# Patient Record
Sex: Female | Born: 1940 | Race: White | Hispanic: No | Marital: Married | Smoking: Former smoker
Health system: Southern US, Community
[De-identification: ages and names within clinical notes are randomized; demographics above are authoritative.]

## PROBLEM LIST (undated history)

## (undated) DIAGNOSIS — I252 Old myocardial infarction: Secondary | ICD-10-CM

## (undated) DIAGNOSIS — N840 Polyp of corpus uteri: Secondary | ICD-10-CM

## (undated) DIAGNOSIS — I2510 Atherosclerotic heart disease of native coronary artery without angina pectoris: Secondary | ICD-10-CM

## (undated) DIAGNOSIS — I471 Supraventricular tachycardia: Secondary | ICD-10-CM

## (undated) DIAGNOSIS — R739 Hyperglycemia, unspecified: Secondary | ICD-10-CM

## (undated) DIAGNOSIS — E785 Hyperlipidemia, unspecified: Secondary | ICD-10-CM

## (undated) DIAGNOSIS — I4719 Other supraventricular tachycardia: Secondary | ICD-10-CM

## (undated) DIAGNOSIS — G4730 Sleep apnea, unspecified: Secondary | ICD-10-CM

## (undated) DIAGNOSIS — E876 Hypokalemia: Secondary | ICD-10-CM

## (undated) DIAGNOSIS — I10 Essential (primary) hypertension: Secondary | ICD-10-CM

## (undated) DIAGNOSIS — G4734 Idiopathic sleep related nonobstructive alveolar hypoventilation: Secondary | ICD-10-CM

## (undated) HISTORY — DX: Sleep apnea, unspecified: G47.30

## (undated) HISTORY — DX: Essential (primary) hypertension: I10

## (undated) HISTORY — DX: Hypokalemia: E87.6

## (undated) HISTORY — DX: Other supraventricular tachycardia: I47.19

## (undated) HISTORY — DX: Supraventricular tachycardia: I47.1

## (undated) HISTORY — PX: TONSILLECTOMY: SUR1361

## (undated) HISTORY — DX: Polyp of corpus uteri: N84.0

## (undated) HISTORY — DX: Idiopathic sleep related nonobstructive alveolar hypoventilation: G47.34

## (undated) HISTORY — PX: APPENDECTOMY: SHX54

---

## 1976-12-21 HISTORY — PX: CHOLECYSTECTOMY: SHX55

## 1998-08-22 ENCOUNTER — Encounter: Admission: RE | Admit: 1998-08-22 | Discharge: 1998-11-20 | Payer: Self-pay | Admitting: *Deleted

## 1998-09-11 ENCOUNTER — Other Ambulatory Visit: Admission: RE | Admit: 1998-09-11 | Discharge: 1998-09-11 | Payer: Self-pay | Admitting: *Deleted

## 1999-03-17 ENCOUNTER — Ambulatory Visit: Admission: RE | Admit: 1999-03-17 | Discharge: 1999-03-17 | Payer: Self-pay | Admitting: Internal Medicine

## 1999-09-01 ENCOUNTER — Encounter: Payer: Self-pay | Admitting: Dentistry

## 1999-09-01 ENCOUNTER — Ambulatory Visit (HOSPITAL_COMMUNITY): Admission: RE | Admit: 1999-09-01 | Discharge: 1999-09-01 | Payer: Self-pay | Admitting: Dentistry

## 1999-09-22 ENCOUNTER — Other Ambulatory Visit: Admission: RE | Admit: 1999-09-22 | Discharge: 1999-09-22 | Payer: Self-pay | Admitting: *Deleted

## 2000-11-08 ENCOUNTER — Other Ambulatory Visit: Admission: RE | Admit: 2000-11-08 | Discharge: 2000-11-08 | Payer: Self-pay | Admitting: *Deleted

## 2001-11-10 ENCOUNTER — Other Ambulatory Visit: Admission: RE | Admit: 2001-11-10 | Discharge: 2001-11-10 | Payer: Self-pay | Admitting: *Deleted

## 2002-09-06 ENCOUNTER — Encounter: Admission: RE | Admit: 2002-09-06 | Discharge: 2002-09-06 | Payer: Self-pay | Admitting: Orthopedic Surgery

## 2002-09-06 ENCOUNTER — Encounter: Payer: Self-pay | Admitting: Orthopedic Surgery

## 2003-01-11 ENCOUNTER — Other Ambulatory Visit: Admission: RE | Admit: 2003-01-11 | Discharge: 2003-01-11 | Payer: Self-pay | Admitting: *Deleted

## 2004-05-05 ENCOUNTER — Encounter: Admission: RE | Admit: 2004-05-05 | Discharge: 2004-05-05 | Payer: Self-pay | Admitting: Internal Medicine

## 2004-06-18 ENCOUNTER — Other Ambulatory Visit: Admission: RE | Admit: 2004-06-18 | Discharge: 2004-06-18 | Payer: Self-pay | Admitting: *Deleted

## 2006-09-09 ENCOUNTER — Other Ambulatory Visit: Admission: RE | Admit: 2006-09-09 | Discharge: 2006-09-09 | Payer: Self-pay | Admitting: Obstetrics & Gynecology

## 2006-09-30 ENCOUNTER — Encounter: Payer: Self-pay | Admitting: Cardiovascular Disease

## 2008-12-28 ENCOUNTER — Ambulatory Visit: Payer: Self-pay | Admitting: Internal Medicine

## 2009-01-01 ENCOUNTER — Encounter: Admission: RE | Admit: 2009-01-01 | Discharge: 2009-01-29 | Payer: Self-pay | Admitting: Internal Medicine

## 2009-01-07 ENCOUNTER — Ambulatory Visit: Payer: Self-pay | Admitting: Internal Medicine

## 2009-02-18 ENCOUNTER — Other Ambulatory Visit: Admission: RE | Admit: 2009-02-18 | Discharge: 2009-02-18 | Payer: Self-pay | Admitting: Obstetrics & Gynecology

## 2009-03-26 ENCOUNTER — Ambulatory Visit: Payer: Self-pay | Admitting: Internal Medicine

## 2009-04-30 ENCOUNTER — Ambulatory Visit: Payer: Self-pay | Admitting: Internal Medicine

## 2009-05-10 ENCOUNTER — Encounter: Payer: Self-pay | Admitting: Cardiovascular Disease

## 2009-05-13 ENCOUNTER — Encounter: Payer: Self-pay | Admitting: Obstetrics & Gynecology

## 2009-05-13 ENCOUNTER — Ambulatory Visit (HOSPITAL_COMMUNITY): Admission: RE | Admit: 2009-05-13 | Discharge: 2009-05-13 | Payer: Self-pay | Admitting: Obstetrics & Gynecology

## 2009-07-01 ENCOUNTER — Encounter (INDEPENDENT_AMBULATORY_CARE_PROVIDER_SITE_OTHER): Payer: Self-pay | Admitting: *Deleted

## 2010-01-16 ENCOUNTER — Ambulatory Visit: Payer: Self-pay | Admitting: Internal Medicine

## 2010-02-19 ENCOUNTER — Telehealth: Payer: Self-pay | Admitting: Gastroenterology

## 2010-04-24 ENCOUNTER — Encounter: Admission: RE | Admit: 2010-04-24 | Discharge: 2010-04-24 | Payer: Self-pay | Admitting: Internal Medicine

## 2010-04-24 ENCOUNTER — Encounter: Payer: Self-pay | Admitting: Cardiovascular Disease

## 2010-04-24 ENCOUNTER — Ambulatory Visit: Payer: Self-pay | Admitting: Internal Medicine

## 2010-06-12 ENCOUNTER — Ambulatory Visit: Payer: Self-pay | Admitting: Cardiovascular Disease

## 2010-06-12 DIAGNOSIS — R9431 Abnormal electrocardiogram [ECG] [EKG]: Secondary | ICD-10-CM | POA: Insufficient documentation

## 2010-06-12 DIAGNOSIS — R0600 Dyspnea, unspecified: Secondary | ICD-10-CM | POA: Insufficient documentation

## 2010-06-12 DIAGNOSIS — I10 Essential (primary) hypertension: Secondary | ICD-10-CM | POA: Insufficient documentation

## 2010-06-13 ENCOUNTER — Telehealth: Payer: Self-pay | Admitting: Cardiovascular Disease

## 2010-08-04 ENCOUNTER — Ambulatory Visit: Payer: Self-pay

## 2010-08-04 ENCOUNTER — Ambulatory Visit: Payer: Self-pay | Admitting: Cardiology

## 2010-08-04 ENCOUNTER — Encounter: Payer: Self-pay | Admitting: Cardiovascular Disease

## 2010-08-04 ENCOUNTER — Ambulatory Visit (HOSPITAL_COMMUNITY): Admission: RE | Admit: 2010-08-04 | Discharge: 2010-08-04 | Payer: Self-pay | Admitting: Cardiovascular Disease

## 2010-08-08 ENCOUNTER — Ambulatory Visit: Payer: Self-pay | Admitting: Cardiovascular Disease

## 2011-01-22 NOTE — Assessment & Plan Note (Signed)
Summary: f30m/f/u stress echo   Visit Type:  Follow-up Primary Provider:  Eden Emms Baxley  CC:  none.  History of Present Illness: 70 year-old woman presenting for follow-up evaluation of shortness of breath and abnormal EKG.  She is doing well at present. She has lost almost 20 pounds over the past three months through diet and exercise and reports improvement in her breathing and exercise capacity. No chest pain, edema, orthopnea, or PND.  She underwent a stress echo earlier this week showing no evidence for ischemia.  Current Medications (verified): 1)  Klor-Con M20 20 Meq Cr-Tabs (Potassium Chloride Crys Cr) .Marland Kitchen.. 1 Tab Two Times A Day 2)  Aspirin 81 Mg Tbec (Aspirin) .... Take One Tablet By Mouth Daily 3)  Lorazepam 2 Mg Tabs (Lorazepam) .Marland Kitchen.. 1 Tab At Bedtime 4)  Folbic 2.5-25-2 Mg Tabs (Fa-Pyridoxine-Cyancobalamin) .Marland Kitchen.. 1 Tab Once Daily 5)  Tenoretic 100 100-25 Mg Tabs (Atenolol-Chlorthalidone) .Marland Kitchen.. 1 Tab Once Daily 6)  Norvasc 5 Mg Tabs (Amlodipine Besylate) .Marland Kitchen.. 1 Tab Once Daily 7)  Nexium 40 Mg Cpdr (Esomeprazole Magnesium) .Marland Kitchen.. 1 Tab Once Daily 8)  Caltrate 600+d 600-400 Mg-Unit Tabs (Calcium Carbonate-Vitamin D) .Marland Kitchen.. 1 Tab Three Times A Day 9)  Combi Patch 0.05/0.25mg  .... Change Patch Bi Weekly  Allergies (verified): 1)  ! Pcn 2)  ! Sulfa 3)  ! Tetracycline 4)  ! * Zinc Oxide  Past History:  Past medical history reviewed for relevance to current acute and chronic problems.  Past Medical History: Reviewed history from 06/12/2010 and no changes required.   Short of breath on exertion  Hypokalemia  Hypertension, essential.   Sleep apnea.   Endometrial polyp on sonohysterogram   Vital Signs:  Patient profile:   70 year old female Height:      61.5 inches Weight:      154 pounds BMI:     28.73 Pulse rate:   55 / minute Resp:     16 per minute BP sitting:   142 / 77  (right arm)  Vitals Entered By: Marrion Coy, CNA (August 08, 2010 10:59 AM)  Physical  Exam  General:  Pt is alert and oriented, in no acute distress. HEENT: normal Neck: normal carotid upstrokes without bruits, JVP normal Lungs: CTA CV: RRR without murmur or gallop Abd: soft, NT, positive BS, no bruit, no organomegaly Ext: no clubbing, cyanosis, or edema. peripheral pulses 2+ and equal Skin: warm and dry without rash    Stress Echocardiogram  Procedure date:  08/04/2010  Findings:      - Stress ECG conclusions: The stress ECG was normal. - Baseline: LV global systolic function was normal. The estimated LV   ejection fraction was 65%. Normal wall motion; no LV regional wall   motion abnormalities. - Peak stress: LV global systolic function was hyperdynamic. Normal   wall motion; no LV regional wall motion abnormalities. - Impressions: Walking of the treadmill there is good exercise   tolerance. There is no EKG change and no ectopy. With stress there   is increase in thickening and contractility in all segments. There   is decreased cavity size in all views. This is a normal stress   echo study.  Impression & Recommendations:  Problem # 1:  ABNORMAL EKG (ICD-794.31) Stress echo reviewed (report as above). Pt with no evidence of myocardial ischemia. Recommend continued risk reduction measures.  Her updated medication list for this problem includes:    Aspirin 81 Mg Tbec (Aspirin) .Marland Kitchen... Take one tablet by  mouth daily    Tenoretic 100 100-25 Mg Tabs (Atenolol-chlorthalidone) .Marland Kitchen... 1 tab once daily    Norvasc 5 Mg Tabs (Amlodipine besylate) .Marland Kitchen... 1 tab once daily  Problem # 2:  HYPERTENSION, BENIGN (ICD-401.1) She asked about changing antihypertensives so that she wouldn't have to take potassium any longer. I reviewed that we could alter meds and discontinue her diuretic. After some discussion, she decided not to make any changes today. She will discuss with Dr Lenord Fellers if she changes her mind.  Her updated medication list for this problem includes:    Aspirin 81  Mg Tbec (Aspirin) .Marland Kitchen... Take one tablet by mouth daily    Tenoretic 100 100-25 Mg Tabs (Atenolol-chlorthalidone) .Marland Kitchen... 1 tab once daily    Norvasc 5 Mg Tabs (Amlodipine besylate) .Marland Kitchen... 1 tab once daily  Patient Instructions: 1)  Your physician recommends that you schedule a follow-up appointment in: 12 months with Dr Excell Seltzer 2)  Your physician recommends that you continue on your current medications as directed. Please refer to the Current Medication list given to you today.

## 2011-01-22 NOTE — Letter (Signed)
Summary: EKG 1997 - 2007  EKG 1997 - 2007   Imported By: Roderic Ovens 06/30/2010 09:36:16  _____________________________________________________________________  External Attachment:    Type:   Image     Comment:   External Document

## 2011-01-22 NOTE — Assessment & Plan Note (Signed)
Summary: np6/sob/abnormal ekg   Visit Type:  NEW PT VISIT Primary Provider:  Eden Emms Baxley  CC:  edema/ankles at times....no other complaints today.  History of Present Illness: 70 year-old woman presenting for initial evaluation of an abnormal EKG. She has been told of an abnormal EKG in the past, dating back several years. She denies history of cardiovascular disease.   She reports onset of shortness of breath with exertion over the past 6 months, but this has improved with diet and exercise. She is walking 30-45 minutes on the treadmill daily, and is only limited by knee pain. She takes a short break in the middle of her walk because of the knee problem.  She denies chest pain or pressure with exertion. Denies palps, lightheadedness, syncope, orthopnea, or PND. She has episodic swelling of her feet.  Current Medications (verified): 1)  Klor-Con M20 20 Meq Cr-Tabs (Potassium Chloride Crys Cr) .Marland Kitchen.. 1 Tab Two Times A Day 2)  Aspirin 81 Mg Tbec (Aspirin) .... Take One Tablet By Mouth Daily 3)  Lorazepam 2 Mg Tabs (Lorazepam) .Marland Kitchen.. 1 Tab At Bedtime 4)  Folbic 2.5-25-2 Mg Tabs (Fa-Pyridoxine-Cyancobalamin) .Marland Kitchen.. 1 Tab Once Daily 5)  Tenoretic 100 100-25 Mg Tabs (Atenolol-Chlorthalidone) .Marland Kitchen.. 1 Tab Once Daily 6)  Norvasc 5 Mg Tabs (Amlodipine Besylate) .Marland Kitchen.. 1 Tab Once Daily 7)  Nexium 40 Mg Cpdr (Esomeprazole Magnesium) .Marland Kitchen.. 1 Tab Once Daily 8)  Caltrate 600+d 600-400 Mg-Unit Tabs (Calcium Carbonate-Vitamin D) .Marland Kitchen.. 1 Tab Three Times A Day 9)  Combi Patch 0.05/0.25mg  .... Change Patch Bi Weekly  Allergies (verified): 1)  ! Pcn 2)  ! Sulfa 3)  ! Tetracycline 4)  ! * Zinc Oxide  Past History:  Past medical, surgical, family and social histories (including risk factors) reviewed, and no changes noted (except as noted below).  Past Medical History:   Short of breath on exertion  Hypokalemia  Hypertension, essential.   Sleep apnea.   Endometrial polyp on sonohysterogram   Past  Surgical History: Reviewed history from 06/04/2010 and no changes required.  Hysteroscopy with polyp resection  Cholecystectomy Appendectomy Tonsillectomy  Family History: Reviewed history from 06/04/2010 and no changes required. Mother - CABG at age age 76  Social History: Reviewed history from 06/04/2010 and no changes required. 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 Also positive for GERD. Otherwise negative except as per HPI.  Vital Signs:  Patient profile:   70 year old female Height:      61.5 inches Weight:      164 pounds BMI:     30.60 Pulse rate:   63 / minute Pulse rhythm:   irregular Resp:     16 per minute BP sitting:   140 / 80  (left arm) Cuff size:   large  Vitals Entered By: Danielle Rankin, CMA (June 12, 2010 10:40 AM)  Physical Exam  General:  Pt is alert and oriented, delightful woman, in no acute distress. HEENT: normal Neck: normal carotid upstrokes without bruits, JVP normal Lungs: CTA CV: RRR without murmur or gallop Abd: soft, NT, positive BS, no bruit, no organomegaly Ext: no clubbing, cyanosis, or edema. peripheral pulses 2+  and equal Skin: warm and dry without rash    EKG  Procedure date:  06/12/2010  Findings:      NSR, T wave abnormality consider inferolater ischemia, HR 63 bpm.  Impression & Recommendations:  Problem # 1:  ABNORMAL EKG (ICD-794.31) The patient has an abnormal resting EKG, with T wave changes suggestive of inferolateral ischemia. I reviewed her EKG's dating back to 1997, and these changes have evolved over time but date back to at least 2007. In the presence of exertional shortness of breath, I have recommended proceeding with a  functional study to rule out ischemia. We discussed various imaging including a stress echo or stress Myoview. I would favor a Myoview because of higher sensitivity, but would be ok with either modality. She will discuss with her husband who is a radiation oncologist, and will call us back to let us know which stress test she is willing to do.  Her updated medication list for this problem includes:    Aspirin 81 Mg Tbec (Aspirin) .Marland Kitchen... Take one tablet by mouth daily    Tenoretic 100 100-25 Mg Tabs (Atenolol-chlorthalidone) .Marland Kitchen... 1 tab once daily    Norvasc 5 Mg Tabs (Amlodipine besylate) .Marland Kitchen... 1 tab once daily  Problem # 2:  HYPERTENSION, BENIGN (ICD-401.1) The patient has borderline BP. Home readings are generally in the 130's by her report. With ongoing efforts at diet and weight loss, I would favor continued observation for now.  Her updated medication list for this problem includes:    Aspirin 81 Mg Tbec (Aspirin) .Marland Kitchen... Take one tablet by mouth daily    Tenoretic 100 100-25 Mg Tabs (Atenolol-chlorthalidone) .Marland Kitchen... 1 tab once daily    Norvasc 5 Mg Tabs (Amlodipine besylate) .Marland Kitchen... 1 tab once daily  Other Orders: EKG w/ Interpretation (93000)  Patient Instructions: 1)  The patient will call back once she decides if she wants to have a stress echocardiogram  or exercise stress  myoview.  2)  Your physician recommends that you schedule a follow-up appointment in: 8 WEEKS 3)  Your physician recommends that you continue on your current medications as directed. Please refer to the Current Medication list given to you today.

## 2011-01-22 NOTE — Progress Notes (Signed)
Summary: Schedule Colonoscopy  Phone Note Outgoing Call Call back at Home Phone [phone]   Call placed by: Harlow Mares CMA Duncan Dull),  February 19, 2010 4:58 PM Call placed to: Patient Summary of Call: patient is going to [place] in April for her daughters wedding and she will call back to schedule her procedure after she gets back  Initial call taken by: Harlow Mares CMA Duncan Dull),  February 19, 2010 5:00 PM

## 2011-01-22 NOTE — Progress Notes (Signed)
Summary: stress echo  Phone Note Call from Patient Call back at Home Phone [phone]   Caller: Patient Reason for Call: Talk to Nurse Summary of Call: pt has decided that she wants a stress echo Initial call taken by: Migdalia Dk,  June 13, 2010 9:45 AM  Follow-up for Phone Call        I spoke with the pt and the first available stress echo slot is 07/10/10.  The pt will be in [place] from 7/17-07/22/10.  The pt's stress echo was scheduled for 08/04/10 at 10:30 and the pt will see Dr Excell Seltzer 08/08/10. Instructions were mailed to the pt's home.  Follow-up by: Julieta Gutting, RN, BSN,  June 13, 2010 12:05 PM

## 2011-03-31 LAB — CBC
HCT: 44.7 % (ref 36.0–46.0)
Platelets: 179 10*3/uL (ref 150–400)
WBC: 5.2 10*3/uL (ref 4.0–10.5)

## 2011-03-31 LAB — URINALYSIS, ROUTINE W REFLEX MICROSCOPIC
Bilirubin Urine: NEGATIVE
Nitrite: NEGATIVE
Specific Gravity, Urine: 1.01 (ref 1.005–1.030)
pH: 7.5 (ref 5.0–8.0)

## 2011-03-31 LAB — BASIC METABOLIC PANEL
BUN: 13 mg/dL (ref 6–23)
GFR calc non Af Amer: >60 mL/min (ref >60)
Potassium: 2.9 mEq/L — ABNORMAL LOW (ref 3.5–5.1)
Sodium: 139 mEq/L (ref 135–145)

## 2011-03-31 LAB — POTASSIUM: Potassium: 3.5 mEq/L (ref 3.5–5.1)

## 2011-04-29 ENCOUNTER — Telehealth: Payer: Self-pay

## 2011-04-29 DIAGNOSIS — I10 Essential (primary) hypertension: Secondary | ICD-10-CM

## 2011-04-29 MED ORDER — AMLODIPINE BESYLATE 5 MG PO TABS: 5.0000 mg | ORAL_TABLET | Freq: Every day | ORAL | Status: DC

## 2011-04-29 NOTE — Telephone Encounter (Signed)
Patient advised with this refill that she is past due for an ov. Message left with pharmacist for her to call office to schedule

## 2011-04-29 NOTE — Telephone Encounter (Signed)
Agree with this plan.

## 2011-05-05 NOTE — Op Note (Signed)
NAMEDENETRA, FORMOSO            ACCOUNT NO.:  [id number]   MEDICAL RECORD NO.:  [id number]          PATIENT TYPE:  AMB   LOCATION:  SDC                           FACILITY:  [facility]   PHYSICIAN:  M. Leda Quail, MD  DATE OF BIRTH:  1941-04-14   DATE OF PROCEDURE:  DATE OF DISCHARGE:                               OPERATIVE REPORT   PREOPERATIVE DIAGNOSES:  82. A 70 year old married white female with postmenopausal bleeding.  2. Endometrial polyp on sonohysterogram  3. Hypertension.  4. Sleep apnea.  5. Hypokalemia which was treated over the weekend with oral potassium      supplementation.   POSTOPERATIVE DIAGNOSES:  30. A 70 year old married white female with postmenopausal bleeding.  2. Endometrial polyp on sonohysterogram  3. Hypertension.  4. Sleep apnea.  5. Hypokalemia which was treated over the weekend with oral potassium      supplementation.   PROCEDURE:  Hysteroscopy with polyp resection using the resectoscope, D  and C.   SURGEON:  M. Leda Quail, MD   ASSISTANTS:  OR staff.   ANESTHESIA:  LMA, Dr. Malen Gauze oversaw the case.   FINDINGS:  Thin endometrium with endometrial polyp on the right fundal  region was noted on sonohysterogram   SPECIMENS:  Polyp and curettings to Pathology.   ESTIMATED BLOOD LOSS:  Minimal.   FLUIDS:  1000 mL of LR.   FLUID DEFICIT:  140 mL of 3% sorbitol.   URINE OUTPUT:  50 mL drained with a red rubber Foley catheter at the  beginning of the case.   COMPLICATIONS:  None.   INDICATIONS:  Ms. Fullenwider is a very nice 70 year old married white  female who has a history of postmenopausal bleeding.  She has had an  endometrial biopsy and a sonohysterogram evaluation of this.  She has  had significant issues with her back this year and she was really trying  to avoid surgery.  For a while with manipulation of HRT, her bleeding  improved and actually stopped but has recurred.  We have discussed risks  and benefits of the procedure  which are document in my office chart.  She has ultimately decided to proceed because of the continued bleeding  and I am in agreement with this plan.   PROCEDURE:  The patient was admitted through Same-Day Surgery.  She was  taken to the operating room.  She was placed in a supine position.  Anesthesia was administered by the anesthesia staff without difficulty.  Legs were positioned in the low lithotomy position on Allen stirrups.  Perineum, inner thighs, and vagina were prepped and draped in normal  sterile fashion.  A red rubber Foley catheter was used to drain the  bladder of all urine.  The legs were lifted slightly, and care was taken  not to put much strain on the low back.  A bivalved speculum was placed  in the vagina.  The cervix was visualized.  The anterior lip was grasped  with single-tooth tenaculum.  The uterus sounds to 8 cm.  Using [facility]  dilators, the cervix was dilated up to a #21.  Then, a  5-mm hysteroscope  was passed through the cervical os to the fundus.  Polyp was visualized.  Tubal ostia were visualized.  Photo documentation was made.  The  endometrium was thin as stated above.  The hysteroscope was removed.  A  polyp forceps was obtained.  With several passes of the polyp forceps,  it felt like the entire polyp was removed.  The diagnostic hysteroscope  was passed back through the endocervical canal.  There was a base of the  polyp which was not removed, but the vast majority of the polyp had  already been removed with the polyp forceps.  Several additional passes  of the polyp forceps were unsuccessful removing the remainder of the  polyp.  Therefore, the cervix was dilated up to #31 using Pratt  dilators.  Then, the resectoscope with a single loop attach was  obtained.  This was passed to the endometrial cavity.  Angling the scope  to the right with 3 passes of the loop, the remainder of the base of the  polyp was completely resected.  This tissue was removed.   Photo  documentation was obtained at this time as the cavity was nice and  smooth and absent of any polyps or other lesions.  Once the hysteroscope  was removed, a #1 rough curette was used to curette the endometrial  cavity.  Scant tissue was obtained.  This was all sent together with the  polyp pieces for pathologic analysis.  At this point, the tenaculum was  removed from the cervix.  Silver nitrate was used to obtain hemostasis  at the tenaculum sites.  The bivalved speculum was removed from the  vagina.  Sponge, lap, needle, and instrument counts were correct x2.  A  paracervical block of 1% lidocaine was instilled in the cervix for a  paracervical block.  Total 10 mL was used.  The procedure was ended at  this time.  Betadine prep was cleansed off the patient's skin.  Her legs  were positioned back in the supine position.  She did have SCDs on lower  extremities throughout the case.  She was awakened from anesthesia and  taken to the recovery room in stable condition.      Lum Keas, MD  Electronically Signed     MSM/MEDQ  D:  05/13/2009  T:  05/13/2009  Job:  [id number]

## 2011-05-14 ENCOUNTER — Other Ambulatory Visit: Payer: Self-pay | Admitting: Internal Medicine

## 2011-05-25 ENCOUNTER — Other Ambulatory Visit: Payer: Self-pay | Admitting: Internal Medicine

## 2011-05-25 DIAGNOSIS — I10 Essential (primary) hypertension: Secondary | ICD-10-CM

## 2011-05-25 MED ORDER — AMLODIPINE BESYLATE 5 MG PO TABS: 5.0000 mg | ORAL_TABLET | Freq: Every day | ORAL | Status: DC

## 2011-06-18 ENCOUNTER — Encounter: Payer: Self-pay | Admitting: Cardiovascular Disease

## 2011-07-07 ENCOUNTER — Telehealth: Payer: Self-pay | Admitting: *Deleted

## 2011-07-07 NOTE — Telephone Encounter (Signed)
Pt called going out of town tomorrow.  Requesting refill for Lorazepam 2mg  faxed to [facility] at [phone].

## 2011-07-07 NOTE — Telephone Encounter (Signed)
Written Rx for Lorazepam 2 mg #30 with 2 refills with directions one tab po hs.

## 2011-08-03 ENCOUNTER — Ambulatory Visit: Payer: Self-pay | Admitting: Cardiovascular Disease

## 2011-09-01 ENCOUNTER — Other Ambulatory Visit: Payer: Self-pay | Admitting: Internal Medicine

## 2011-09-11 ENCOUNTER — Ambulatory Visit: Payer: Self-pay | Admitting: Cardiovascular Disease

## 2011-09-22 ENCOUNTER — Other Ambulatory Visit: Payer: Self-pay | Admitting: *Deleted

## 2011-09-22 DIAGNOSIS — I10 Essential (primary) hypertension: Secondary | ICD-10-CM

## 2011-09-22 MED ORDER — AMLODIPINE BESYLATE 5 MG PO TABS: 5.0000 mg | ORAL_TABLET | Freq: Every day | ORAL | Status: DC

## 2011-09-29 ENCOUNTER — Encounter: Payer: Self-pay | Admitting: Cardiovascular Disease

## 2011-09-29 ENCOUNTER — Ambulatory Visit (INDEPENDENT_AMBULATORY_CARE_PROVIDER_SITE_OTHER): Payer: Medicare Other | Admitting: Cardiovascular Disease

## 2011-09-29 DIAGNOSIS — R9431 Abnormal electrocardiogram [ECG] [EKG]: Secondary | ICD-10-CM

## 2011-09-29 DIAGNOSIS — I10 Essential (primary) hypertension: Secondary | ICD-10-CM

## 2011-09-29 NOTE — Patient Instructions (Addendum)
Your physician wants you to follow-up in: 1 YEAR.  You will receive a reminder letter in the mail two months in advance. If you don't receive a letter, please call our office to schedule the follow-up appointment.  Your physician recommends that you continue on your current medications as directed. Please refer to the Current Medication list given to you today.  Your physician recommends that you return for a FASTING LIPID, LIVER, TSH, CBC, BMP--nothing to eat or drink after midnight, lab opens at 8:30 (Pt will call back to schedule labs 401.1, 794.31)

## 2011-10-04 ENCOUNTER — Encounter: Payer: Self-pay | Admitting: Cardiovascular Disease

## 2011-10-04 NOTE — Assessment & Plan Note (Signed)
Blood pressure is well controlled on amlodipine, atenolol, and chlorthalidone.

## 2011-10-04 NOTE — Assessment & Plan Note (Signed)
She has undergone past evaluation with a stress echocardiogram. She has no cardiac symptoms at this time. No further testing is indicated.

## 2011-10-04 NOTE — Progress Notes (Signed)
HPI:  This is a 70 year old woman presenting for followup evaluation. She has hypertension and a history of abnormal EKG. She underwent a stress echocardiogram last year that showed no significant ischemia. Overall the patient is doing well. She has stable dyspnea with exertion. She denies chest pain or chest pressure. She denies edema, orthopnea, or PND. She's had no lightheadedness, palpitations, or syncope. She reports good compliance with her medical program.  Outpatient Encounter Prescriptions as of 09/29/2011  Medication Sig Dispense Refill  . amLODipine (NORVASC) 5 MG tablet Take 1 tablet (5 mg total) by mouth daily.  30 tablet  3  . aspirin 81 MG tablet Take 81 mg by mouth daily.        Marland Kitchen atenolol-chlorthalidone (TENORETIC) 100-25 MG per tablet TAKE 1 TABLET EVERY DAY  90 tablet  0  . Calcium Carbonate-Vitamin D (CALTRATE 600+D) 600-400 MG-UNIT per tablet Take 1 tablet by mouth 3 (three) times daily with meals.        Marland Kitchen estradiol-norethindrone (COMBIPATCH) 0.05-0.25 MG/DAY Place 1 patch onto the skin 2 (two) times a week.        . folic acid-pyridoxine-cyancobalamin (FOLTX) 2.5-25-2 MG TABS Take 1 tablet by mouth daily.        Marland Kitchen LORazepam (ATIVAN) 2 MG tablet Take 2 mg by mouth at bedtime.        Marland Kitchen NEXIUM 40 MG capsule TAKE ONE CAPSULE BY MOUTH EVERY DAY  30 capsule  11  . potassium chloride SA (KLOR-CON M20) 20 MEQ tablet Take 20 mEq by mouth 2 (two) times daily.          Allergies  Allergen Reactions  . Penicillins   . Sulfonamide Derivatives   . Tetracycline   . Zinc Oxide     Past Medical History  Diagnosis Date  . SOB (shortness of breath)     on exertion  . Hypokalemia   . HTN (hypertension)     essential  . Sleep apnea   . Endometrial polyp     ROS: Negative except as per HPI  BP 130/80  Pulse 55  Resp 18  Ht 5\' 2"  (1.575 m)  Wt 155 lb (70.308 kg)  BMI 28.35 kg/m2  PHYSICAL EXAM: Pt is alert and oriented, NAD HEENT: normal Neck: JVP - normal, carotids 2+=  without bruits Lungs: CTA bilaterally CV: RRR without murmur or gallop Abd: soft, NT, Positive BS, no hepatomegaly Ext: no C/C/E, distal pulses intact and equal Skin: warm/dry no rash  EKG:  Sinus bradycardia 55 beats per minute, ST and T wave abnormality consider anterolateral and inferior ischemia, no significant change since last tracing.  ASSESSMENT AND PLAN:

## 2011-10-07 ENCOUNTER — Other Ambulatory Visit: Payer: Self-pay

## 2011-10-07 MED ORDER — LORAZEPAM 2 MG PO TABS: 2.0000 mg | ORAL_TABLET | Freq: Every day | ORAL | Status: DC

## 2011-11-28 ENCOUNTER — Other Ambulatory Visit: Payer: Self-pay | Admitting: Internal Medicine

## 2011-11-30 ENCOUNTER — Other Ambulatory Visit: Payer: Self-pay

## 2011-12-14 ENCOUNTER — Other Ambulatory Visit: Payer: Self-pay

## 2011-12-14 MED ORDER — ATENOLOL-CHLORTHALIDONE 100-25 MG PO TABS: 1.0000 | ORAL_TABLET | Freq: Every day | ORAL | Status: DC

## 2012-01-15 ENCOUNTER — Other Ambulatory Visit: Payer: Self-pay | Admitting: Internal Medicine

## 2012-01-26 ENCOUNTER — Other Ambulatory Visit: Payer: Self-pay

## 2012-01-26 MED ORDER — ATENOLOL-CHLORTHALIDONE 100-25 MG PO TABS: 1.0000 | ORAL_TABLET | Freq: Every day | ORAL | Status: DC

## 2012-02-29 ENCOUNTER — Other Ambulatory Visit: Payer: Self-pay

## 2012-02-29 MED ORDER — ATENOLOL-CHLORTHALIDONE 100-25 MG PO TABS: 1.0000 | ORAL_TABLET | Freq: Every day | ORAL | Status: DC

## 2012-03-01 ENCOUNTER — Other Ambulatory Visit: Payer: Self-pay | Admitting: Internal Medicine

## 2012-03-08 ENCOUNTER — Telehealth: Payer: Self-pay | Admitting: Cardiovascular Disease

## 2012-03-08 DIAGNOSIS — R739 Hyperglycemia, unspecified: Secondary | ICD-10-CM

## 2012-03-08 NOTE — Telephone Encounter (Signed)
Pt is requesting to have a hgba1c done tomorrow am when she comes in for blood work.  She states she had one done a year ago and it was elevated.  She states has been working on diet and exercise.

## 2012-03-08 NOTE — Telephone Encounter (Signed)
New Msg: Pt calling wanting to know if MD can put order in for pt get Hem A1C drawn when pt comes in to get lab work tomorrow 03/09/12. Please return pt call to discuss further if necessary or put order in.

## 2012-03-08 NOTE — Telephone Encounter (Signed)
Pt was notified that hgba1c was ordered.

## 2012-03-08 NOTE — Telephone Encounter (Signed)
That's fine

## 2012-03-09 ENCOUNTER — Other Ambulatory Visit (INDEPENDENT_AMBULATORY_CARE_PROVIDER_SITE_OTHER): Payer: Medicare Other

## 2012-03-09 DIAGNOSIS — R9431 Abnormal electrocardiogram [ECG] [EKG]: Secondary | ICD-10-CM

## 2012-03-09 DIAGNOSIS — I10 Essential (primary) hypertension: Secondary | ICD-10-CM | POA: Diagnosis not present

## 2012-03-09 DIAGNOSIS — R739 Hyperglycemia, unspecified: Secondary | ICD-10-CM

## 2012-03-09 DIAGNOSIS — R7309 Other abnormal glucose: Secondary | ICD-10-CM

## 2012-03-09 LAB — LIPID PANEL
Cholesterol: 240 mg/dL — ABNORMAL HIGH (ref 0–200)
Total CHOL/HDL Ratio: 4
Triglycerides: 212 mg/dL — ABNORMAL HIGH (ref 0.0–149.0)

## 2012-03-09 LAB — CBC WITH DIFFERENTIAL/PLATELET
Basophils Absolute: 0 10*3/uL (ref 0.0–0.1)
Basophils Relative: 0.5 % (ref 0.0–3.0)
Hemoglobin: 15 g/dL (ref 12.0–15.0)
Lymphocytes Relative: 34.1 % (ref 12.0–46.0)
Monocytes Relative: 7.5 % (ref 3.0–12.0)
Neutro Abs: 2.4 10*3/uL (ref 1.4–7.7)
Neutrophils Relative %: 56.3 % (ref 43.0–77.0)
RBC: 4.58 Mil/uL (ref 3.87–5.11)

## 2012-03-09 LAB — BASIC METABOLIC PANEL
Calcium: 9.4 mg/dL (ref 8.4–10.5)
Creatinine, Ser: 0.9 mg/dL (ref 0.4–1.2)
GFR: 69.03 mL/min (ref >60.00)
Sodium: 139 mEq/L (ref 135–145)

## 2012-03-09 LAB — HEPATIC FUNCTION PANEL
ALT: 17 U/L (ref 0–35)
AST: 20 U/L (ref 0–37)
Albumin: 4 g/dL (ref 3.5–5.2)
Total Protein: 6.9 g/dL (ref 6.0–8.3)

## 2012-03-09 LAB — LDL CHOLESTEROL, DIRECT: Direct LDL: 152.4 mg/dL

## 2012-03-09 LAB — HEMOGLOBIN A1C: Hgb A1c MFr Bld: 5.7 % (ref 4.6–6.5)

## 2012-03-16 ENCOUNTER — Telehealth: Payer: Self-pay | Admitting: Cardiovascular Disease

## 2012-03-16 NOTE — Telephone Encounter (Signed)
Pt came in Signed ROI, Picked up Copy of Labs 03/16/12/KM

## 2012-03-17 ENCOUNTER — Encounter: Payer: Medicare Other | Admitting: Internal Medicine

## 2012-03-26 ENCOUNTER — Other Ambulatory Visit: Payer: Self-pay

## 2012-03-26 ENCOUNTER — Inpatient Hospital Stay (HOSPITAL_COMMUNITY)
Admission: EM | Admit: 2012-03-26 | Discharge: 2012-03-30 | DRG: 247 | Disposition: A | Discharge status: Home / Self Care | Payer: Medicare Other | Attending: Cardiovascular Disease | Admitting: Cardiovascular Disease

## 2012-03-26 ENCOUNTER — Encounter (HOSPITAL_COMMUNITY): Payer: Self-pay | Admitting: Physical Medicine and Rehabilitation

## 2012-03-26 DIAGNOSIS — I959 Hypotension, unspecified: Secondary | ICD-10-CM | POA: Diagnosis not present

## 2012-03-26 DIAGNOSIS — R7309 Other abnormal glucose: Secondary | ICD-10-CM | POA: Diagnosis present

## 2012-03-26 DIAGNOSIS — E876 Hypokalemia: Secondary | ICD-10-CM | POA: Diagnosis present

## 2012-03-26 DIAGNOSIS — E785 Hyperlipidemia, unspecified: Secondary | ICD-10-CM | POA: Diagnosis present

## 2012-03-26 DIAGNOSIS — I214 Non-ST elevation (NSTEMI) myocardial infarction: Principal | ICD-10-CM | POA: Diagnosis present

## 2012-03-26 DIAGNOSIS — I10 Essential (primary) hypertension: Secondary | ICD-10-CM | POA: Diagnosis present

## 2012-03-26 DIAGNOSIS — L740 Miliaria rubra: Secondary | ICD-10-CM | POA: Diagnosis not present

## 2012-03-26 DIAGNOSIS — G4733 Obstructive sleep apnea (adult) (pediatric): Secondary | ICD-10-CM | POA: Diagnosis present

## 2012-03-26 DIAGNOSIS — I2510 Atherosclerotic heart disease of native coronary artery without angina pectoris: Secondary | ICD-10-CM | POA: Diagnosis present

## 2012-03-26 DIAGNOSIS — Z23 Encounter for immunization: Secondary | ICD-10-CM

## 2012-03-26 DIAGNOSIS — I517 Cardiomegaly: Secondary | ICD-10-CM | POA: Diagnosis not present

## 2012-03-26 DIAGNOSIS — R079 Chest pain, unspecified: Secondary | ICD-10-CM | POA: Diagnosis not present

## 2012-03-26 DIAGNOSIS — I498 Other specified cardiac arrhythmias: Secondary | ICD-10-CM | POA: Diagnosis not present

## 2012-03-26 HISTORY — DX: Atherosclerotic heart disease of native coronary artery without angina pectoris: I25.10

## 2012-03-26 HISTORY — DX: Hyperlipidemia, unspecified: E78.5

## 2012-03-26 HISTORY — DX: Hyperglycemia, unspecified: R73.9

## 2012-03-26 LAB — DIFFERENTIAL
Basophils Absolute: 0 10*3/uL (ref 0.0–0.1)
Eosinophils Relative: 2 % (ref 0–5)
Lymphocytes Relative: 36 % (ref 12–46)
Monocytes Absolute: 0.6 10*3/uL (ref 0.1–1.0)
Monocytes Relative: 9 % (ref 3–12)

## 2012-03-26 LAB — APTT: aPTT: 32 seconds (ref 24–37)

## 2012-03-26 LAB — COMPREHENSIVE METABOLIC PANEL
AST: 22 U/L (ref 0–37)
BUN: 13 mg/dL (ref 6–23)
CO2: 29 mEq/L (ref 19–32)
Calcium: 9.6 mg/dL (ref 8.4–10.5)
Chloride: 98 mEq/L (ref 96–112)
Creatinine, Ser: 0.82 mg/dL (ref 0.50–1.10)
GFR calc Af Amer: 81 mL/min — ABNORMAL LOW (ref >90)
GFR calc non Af Amer: 70 mL/min — ABNORMAL LOW (ref >90)
Total Bilirubin: 0.3 mg/dL (ref 0.3–1.2)

## 2012-03-26 LAB — CBC
HCT: 43.7 % (ref 36.0–46.0)
Hemoglobin: 15.4 g/dL — ABNORMAL HIGH (ref 12.0–15.0)
MCHC: 35.2 g/dL (ref 30.0–36.0)
MCV: 92.2 fL (ref 78.0–100.0)
RDW: 12.4 % (ref 11.5–15.5)

## 2012-03-26 LAB — POCT I-STAT TROPONIN I: Troponin i, poc: 0.09 ng/mL (ref 0.00–0.08)

## 2012-03-26 LAB — CARDIAC PANEL(CRET KIN+CKTOT+MB+TROPI)
Relative Index: INVALID (ref 0.0–2.5)
Total CK: 87 U/L (ref 7–177)

## 2012-03-26 MED ORDER — NITROGLYCERIN IN D5W 200-5 MCG/ML-% IV SOLN
3.0000 ug/min | INTRAVENOUS | Status: DC
  Administered 2012-03-26: 3 ug/min via INTRAVENOUS
  Filled 2012-03-26: qty 250

## 2012-03-26 MED ORDER — ATENOLOL-CHLORTHALIDONE 100-25 MG PO TABS: 1.0000 | ORAL_TABLET | Freq: Every day | ORAL | Status: DC

## 2012-03-26 MED ORDER — ONDANSETRON HCL 4 MG/2ML IJ SOLN
4.0000 mg | Freq: Four times a day (QID) | INTRAMUSCULAR | Status: DC | PRN
  Administered 2012-03-27: 4 mg via INTRAVENOUS
  Filled 2012-03-26: qty 2

## 2012-03-26 MED ORDER — ATENOLOL 100 MG PO TABS
100.0000 mg | ORAL_TABLET | Freq: Every day | ORAL | Status: DC
  Filled 2012-03-26: qty 1

## 2012-03-26 MED ORDER — NITROGLYCERIN 0.4 MG SL SUBL
0.4000 mg | SUBLINGUAL_TABLET | SUBLINGUAL | Status: DC | PRN
  Administered 2012-03-26: 0.4 mg via SUBLINGUAL
  Filled 2012-03-26: qty 25

## 2012-03-26 MED ORDER — LORAZEPAM 1 MG PO TABS: 2.0000 mg | ORAL_TABLET | Freq: Every day | ORAL | Status: DC

## 2012-03-26 MED ORDER — NITROGLYCERIN 0.4 MG SL SUBL: 0.4000 mg | SUBLINGUAL_TABLET | SUBLINGUAL | Status: DC | PRN

## 2012-03-26 MED ORDER — ATORVASTATIN CALCIUM 10 MG PO TABS
10.0000 mg | ORAL_TABLET | Freq: Every day | ORAL | Status: DC
  Filled 2012-03-26 (×2): qty 1

## 2012-03-26 MED ORDER — HEPARIN (PORCINE) IN NACL 100-0.45 UNIT/ML-% IJ SOLN
800.0000 [IU]/h | INTRAMUSCULAR | Status: DC
  Administered 2012-03-26: 800 [IU]/h via INTRAVENOUS
  Filled 2012-03-26: qty 250

## 2012-03-26 MED ORDER — POTASSIUM CHLORIDE CRYS ER 20 MEQ PO TBCR
20.0000 meq | EXTENDED_RELEASE_TABLET | Freq: Two times a day (BID) | ORAL | Status: DC
  Administered 2012-03-27 – 2012-03-30 (×7): 20 meq via ORAL
  Filled 2012-03-26 (×8): qty 1

## 2012-03-26 MED ORDER — HEPARIN BOLUS VIA INFUSION
3500.0000 [IU] | Freq: Once | INTRAVENOUS | Status: AC
  Administered 2012-03-26: 3500 [IU] via INTRAVENOUS

## 2012-03-26 MED ORDER — AMLODIPINE BESYLATE 5 MG PO TABS
5.0000 mg | ORAL_TABLET | Freq: Every day | ORAL | Status: DC
  Administered 2012-03-27: 5 mg via ORAL
  Filled 2012-03-26: qty 1

## 2012-03-26 MED ORDER — CHLORTHALIDONE 25 MG PO TABS
25.0000 mg | ORAL_TABLET | Freq: Every day | ORAL | Status: DC
  Filled 2012-03-26: qty 1

## 2012-03-26 MED ORDER — CALCIUM CARBONATE-VITAMIN D 600-400 MG-UNIT PO TABS: 1.0000 | ORAL_TABLET | Freq: Two times a day (BID) | ORAL | Status: DC

## 2012-03-26 MED ORDER — ACETAMINOPHEN 325 MG PO TABS: 650.0000 mg | ORAL_TABLET | ORAL | Status: DC | PRN

## 2012-03-26 MED ORDER — PANTOPRAZOLE SODIUM 40 MG PO TBEC
80.0000 mg | DELAYED_RELEASE_TABLET | Freq: Every day | ORAL | Status: DC
  Administered 2012-03-27 – 2012-03-29 (×3): 80 mg via ORAL
  Filled 2012-03-26 (×3): qty 1
  Filled 2012-03-26: qty 2

## 2012-03-26 MED ORDER — ASPIRIN EC 81 MG PO TBEC
81.0000 mg | DELAYED_RELEASE_TABLET | Freq: Every day | ORAL | Status: DC
  Administered 2012-03-27 – 2012-03-30 (×3): 81 mg via ORAL
  Filled 2012-03-26 (×4): qty 1

## 2012-03-26 MED ORDER — CALCIUM CARBONATE-VITAMIN D 500-200 MG-UNIT PO TABS
1.0000 | ORAL_TABLET | Freq: Two times a day (BID) | ORAL | Status: DC
  Administered 2012-03-27 – 2012-03-30 (×6): 1 via ORAL
  Filled 2012-03-26 (×9): qty 1

## 2012-03-26 MED ORDER — ASPIRIN 81 MG PO CHEW
324.0000 mg | CHEWABLE_TABLET | ORAL | Status: AC
  Administered 2012-03-27: 324 mg via ORAL
  Filled 2012-03-26: qty 4

## 2012-03-26 MED ORDER — POTASSIUM CHLORIDE CRYS ER 20 MEQ PO TBCR
40.0000 meq | EXTENDED_RELEASE_TABLET | Freq: Once | ORAL | Status: AC
  Administered 2012-03-26: 40 meq via ORAL
  Filled 2012-03-26: qty 2

## 2012-03-26 MED ORDER — FA-PYRIDOXINE-CYANOCOBALAMIN 2.5-25-2 MG PO TABS
1.0000 | ORAL_TABLET | Freq: Every day | ORAL | Status: DC
  Administered 2012-03-27 – 2012-03-30 (×4): 1 via ORAL
  Filled 2012-03-26 (×4): qty 1

## 2012-03-26 MED ORDER — MORPHINE SULFATE 2 MG/ML IJ SOLN
INTRAMUSCULAR | Status: AC
  Administered 2012-03-26: 2 mg via INTRAVENOUS
  Filled 2012-03-26: qty 1

## 2012-03-26 MED ORDER — CALCIUM CARBONATE-VITAMIN D 500-200 MG-UNIT PO TABS
1.0000 | ORAL_TABLET | Freq: Two times a day (BID) | ORAL | Status: DC
  Filled 2012-03-26: qty 1

## 2012-03-26 MED ORDER — MORPHINE SULFATE 2 MG/ML IJ SOLN
2.0000 mg | INTRAMUSCULAR | Status: DC | PRN
  Administered 2012-03-26: 2 mg via INTRAVENOUS

## 2012-03-26 MED ORDER — ASPIRIN 81 MG PO TABS: 162.0000 mg | ORAL_TABLET | Freq: Every day | ORAL | Status: DC

## 2012-03-26 MED ORDER — LORAZEPAM 0.5 MG PO TABS
2.0000 mg | ORAL_TABLET | Freq: Every day | ORAL | Status: DC
  Administered 2012-03-27 – 2012-03-29 (×3): 2 mg via ORAL
  Filled 2012-03-26 (×2): qty 2
  Filled 2012-03-26: qty 3
  Filled 2012-03-26: qty 1

## 2012-03-26 MED ORDER — ACETAMINOPHEN 500 MG PO TABS
1000.0000 mg | ORAL_TABLET | Freq: Four times a day (QID) | ORAL | Status: DC | PRN
  Administered 2012-03-27 – 2012-03-29 (×4): 1000 mg via ORAL
  Filled 2012-03-26 (×4): qty 2

## 2012-03-26 NOTE — ED Notes (Signed)
Patient being transported upstairs on portable cardiac monitor with RN. 

## 2012-03-26 NOTE — ED Notes (Signed)
Pt presents to department for evaluation of midsternal chest pain radiating to throat. Onset today. Pt states she has acid reflux but this pain feels different. Denies SOB. States that she feels hot and flushed. 2/10 pain upon arrival. Pt conscious alert and oriented x4. Respirations unlabored. Took 325mg  ASA prior to arrival.

## 2012-03-26 NOTE — ED Notes (Signed)
Patient here with episode at 3 pm started feeling tired and sts thought it was reflux but did not have change in pain, had similar episode on Wednesday as well. Today and now it is focused more on left chest but when it first started it was mainly midline but radiated upward and into jaw like a toothach. Receeding in nature now rates at 3/10

## 2012-03-26 NOTE — ED Notes (Addendum)
Patient currently sitting up in bed; no respiratory or acute distress noted.  Cardiologist at bedside.  Will continue to monitor. 

## 2012-03-26 NOTE — ED Notes (Signed)
Calling report now. 

## 2012-03-26 NOTE — Progress Notes (Signed)
ANTICOAGULATION CONSULT NOTE - Initial Consult  Pharmacy Consult:  Heparin Indication: ACS  Allergies  Allergen Reactions  . Penicillins Hives and Swelling  . Sulfonamide Derivatives Hives and Swelling  . Tetracycline Hives and Swelling  . Zinc Oxide Rash    Patient Measurements: Weight = 70kg Heparin Dosing Weight: 65kg  Vital Signs: Temp: 98.4 F (36.9 C) (04/06 1643) Temp src: Oral (04/06 1643) BP: 154/67 mmHg (04/06 1643) Pulse Rate: 68  (04/06 1643)  Labs:  Basename 03/26/12 1655  HGB 15.4*  HCT 43.7  PLT 162  APTT --  LABPROT --  INR --  HEPARINUNFRC --  CREATININE 0.82  CKTOTAL --  CKMB --  TROPONINI --   The CrCl is unknown because both a height and weight (above a minimum accepted value) are required for this calculation.  Medical History: Past Medical History  Diagnosis Date  . SOB (shortness of breath)     on exertion  . Hypokalemia   . HTN (hypertension)     essential  . Sleep apnea   . Endometrial polyp      Assessment: 25 YOF with mid-sternal chest pain/positive cardiac enzymes to start heparin gtt for anticoagulation.  CBC WNL, baseline INR 1.   Goal of Therapy:  HL 0.3 - 0.7 units/mL    Plan:  - Heparin 3500 units IV bolus, then - Heparin gtt at 800 units/hr  - Check 8 hr HL - Daily HL / CBC    Alyzza Andringa D. Laney Potash, PharmD, BCPS Pager:  [phone] 03/26/2012, 7:00 PM

## 2012-03-26 NOTE — H&P (Signed)
Admit date: 03/26/2012 Referring Physician  Dr. Preston Fleeting  Primary Cardiologist Dr. Excell Seltzer Chief complaint/reason for admission:Chest pain  HPI: This is a 70yo WF with a histofy of HTN and SOB with normal stress myoview done in 2011 for inverted T wave in lateral precordial waves that was normal.  She last saw Dr.Cooper in the fall and was doing well.  About 3pm today she developed chest pain midsternal with radiation to the left side and up into her neck and left arm.  She describes it as a heaviness which lasted about 1-2 hours and since being in the ER has been intermittent.  She denied any SOB, diaphoresis, palpitations or dizziness.  She was mildly flushed and nauseated. She also felt fatigue more than usual.  Her SP currently is 3/10.      PMH:    Past Medical History  Diagnosis Date  . SOB (shortness of breath)     on exertion  . Hypokalemia   . HTN (hypertension)     essential  . Sleep apnea   . Endometrial polyp     PSH:    Past Surgical History  Procedure Date  . Cholecystectomy   . Appendectomy   . Tonsillectomy   . Cesarean section     ALLERGIES:   Penicillins; Sulfonamide derivatives; Tetracycline; and Zinc oxide  Prior to Admit Meds:   (Not in a hospital admission) Family HX:    Family History  Problem Relation Age of Onset  . Coronary artery disease Mother     CABG at age 28   Social HX:    History   Social History  . Marital Status: Married    Spouse Name: N/A    Number of Children: N/A  . Years of Education: N/A   Occupational History  . Retired Engineer, civil (consulting)    Social History Main Topics  . Smoking status: Former Games developer  . Smokeless tobacco: Not on file  . Alcohol Use: Yes     socially  . Drug Use: No  . Sexually Active: Not on file   Other Topics Concern  . Not on file   Social History Narrative  . No narrative on file     ROS:  All 11 ROS were addressed and are negative except what is stated in the HPI  PHYSICAL EXAM Filed Vitals:   03/26/12  1945  BP: 147/64  Pulse: 64  Temp:   Resp: 12   General: Well developed, well nourished, in no acute distress Head: Eyes PERRLA, No xanthomas.   Normal cephalic and atramatic  Lungs:   Clear bilaterally to auscultation and percussion. Heart:   HRRR S1 S2 Pulses are 2+ & equal. Abdomen: Bowel sounds are positive, abdomen soft and non-tender without masses Extremities:   No clubbing, cyanosis or edema.  DP +1 Neuro: Alert and oriented X 3. Psych:  Good affect, responds appropriately   Labs:   Lab Results  Component Value Date   WBC 6.0 03/26/2012   HGB 15.4* 03/26/2012   HCT 43.7 03/26/2012   MCV 92.2 03/26/2012   PLT 162 03/26/2012    Lab 03/26/12 1655  NA 137  K 3.4*  CL 98  CO2 29  BUN 13  CREATININE 0.82  CALCIUM 9.6  PROT 7.0  BILITOT 0.3  ALKPHOS 60  ALT 19  AST 22  GLUCOSE 103*     Lab Results  Component Value Date   INR 1.00 03/26/2012     Lab Results  Component Value Date  CHOL 240* 03/09/2012   Lab Results  Component Value Date   HDL 57.90 03/09/2012   No results found for this basename: [facility]   Lab Results  Component Value Date   TRIG 212.0* 03/09/2012   Lab Results  Component Value Date   CHOLHDL 4 03/09/2012   Lab Results  Component Value Date   LDLDIRECT 152.4 03/09/2012      Radiology:  None  EKG:  NSR with normalization of old T wave inversions in the lateral precordial leads  ASSESSMENT:  1.  Chest pain with mildly elevated troponin and normalization of old T wave inversions in the lateral precordial leads 2.  HTN 3.  OSA not on CPAP 4.  hypokalemia  PLAN:   1.  Admit to tele bed, cycle cardiac enzymes 2.  IV Heparin gtt 3.  IV NTG gtt 4.  ASA 5.  Continue Tenoretic/amlodipine 6.  Replete potassium 7.  Further workup per [facility] Cardiology  Quintella Reichert, MD  03/26/2012  8:59 PM

## 2012-03-26 NOTE — ED Provider Notes (Signed)
History     CSN: [id number]  Arrival date & time 03/26/12  1628   First MD Initiated Contact with Patient 03/26/12 1838      Chief Complaint  Patient presents with  . Chest Pain    (Consider location/radiation/quality/duration/timing/severity/associated sxs/prior treatment) Patient is a 71 y.o. female presenting with chest pain. The history is provided by the patient.  Chest Pain   She had onset at about 3:30 PM of feeling generally weak. She sat down and then developed a pain across her chest with some radiation to her neck and jaw. She is unable to characterize the pain other than that it was not pressure and was not tightness and it was not sharp. Pain was moderately severe at 8/10. It is now subsided and is about 2/10. There was mild nausea, but no dyspnea or diaphoresis. She relates that she had similar episode about 3 days ago which woke her up during the night and only lasted about 20-30 minutes. She's also had several other episodes over the last several weeks which have always been nocturnal. While this pain has been present today, nothing makes it better nothing makes it worse. She tried going to different positions including sitting, standing, walking and nothing seem to affect it. She has seen a cardiologist for an abnormal ECG and had a stress test about 18 months ago which was reported to be normal.  Past Medical History  Diagnosis Date  . SOB (shortness of breath)     on exertion  . Hypokalemia   . HTN (hypertension)     essential  . Sleep apnea   . Endometrial polyp     Past Surgical History  Procedure Date  . Hysterectomy with polpy resection   . Cholecystectomy   . Appendectomy   . Tonsillectomy     Family History  Problem Relation Age of Onset  . Coronary artery disease Mother     CABG at age 91    History  Substance Use Topics  . Smoking status: Never Smoker   . Smokeless tobacco: Not on file  . Alcohol Use: Yes     socially    OB History    Grav Para Term Preterm Abortions TAB SAB Ect Mult Living                  Review of Systems  Cardiovascular: Positive for chest pain.  All other systems reviewed and are negative.    Allergies  Penicillins; Sulfonamide derivatives; Tetracycline; and Zinc oxide  Home Medications   Current Outpatient Rx  Name Route Sig Dispense Refill  . ACETAMINOPHEN 500 MG PO TABS Oral Take 1,000 mg by mouth every 6 (six) hours as needed. For pain    . AMLODIPINE BESYLATE 5 MG PO TABS  TAKE 1 TABLET (5 MG TOTAL) BY MOUTH DAILY. 30 tablet 3  . ASPIRIN 325 MG PO TABS Oral Take 325 mg by mouth every 6 (six) hours as needed. For pain    . ASPIRIN 81 MG PO TABS Oral Take 162 mg by mouth daily.     . ATENOLOL-CHLORTHALIDONE 100-25 MG PO TABS Oral Take 1 tablet by mouth daily. 30 tablet 3  . CALCIUM CARBONATE-VITAMIN D 600-400 MG-UNIT PO TABS Oral Take 1 tablet by mouth 2 (two) times daily.     Marland Kitchen ESTRADIOL-NORETHINDRONE ACET 0.05-0.25 MG/DAY TD PTTW Transdermal Place 1 patch onto the skin 2 (two) times a week.      . FOLBIC 2.5-25-2 MG PO TABS  TAKE 1 TABLET EVERY DAY 30 tablet 12  . KLOR-CON M20 20 MEQ PO TBCR  TAKE 1 TABLET BY MOUTH TWICE DAILY 60 tablet 11  . LORAZEPAM 2 MG PO TABS Oral Take 1 tablet (2 mg total) by mouth at bedtime. 30 tablet 5  . NEXIUM 40 MG PO CPDR  TAKE ONE CAPSULE BY MOUTH EVERY DAY 30 capsule 11    BP 154/67  Pulse 68  Temp(Src) 98.4 F (36.9 C) (Oral)  Resp 16  SpO2 97%  Physical Exam  Nursing note and vitals reviewed. 71 year old female who is resting comfortably and in no acute distress. Vital signs are significant for mild hypertension with blood pressure 154/67. Oxygen saturation is 97% which is normal. Head is normal cephalic and atraumatic. PERRLA, EOMI. Her Chal[place] Guest is clear. Neck is supple without adenopathy. There is no JVD. Lungs are clear without rales, wheezes, rhonchi. Heart has regular rate rhythm without murmur. Abdomen is soft, flat, nontender without masses  or hepatosplenomegaly. Extremities have no cyanosis or edema. Skin is warm and dry without rash. Neurologic: Mental status is normal, cranial nerves are intact, there are no focal motor or sensory deficits.  ED Course  Procedures (including critical care time)  Results for orders placed during the hospital encounter of 03/26/12  CBC      Component Value Range   WBC 6.0  4.0 - 10.5 (K/uL)   RBC 4.74  3.87 - 5.11 (MIL/uL)   Hemoglobin 15.4 (*) 12.0 - 15.0 (g/dL)   HCT 40.9  81.1 - 91.4 (%)   MCV 92.2  78.0 - 100.0 (fL)   MCH 32.5  26.0 - 34.0 (pg)   MCHC 35.2  30.0 - 36.0 (g/dL)   RDW 78.2  95.6 - 21.3 (%)   Platelets 162  150 - 400 (K/uL)  DIFFERENTIAL      Component Value Range   Neutrophils Relative 53  43 - 77 (%)   Neutro Abs 3.2  1.7 - 7.7 (K/uL)   Lymphocytes Relative 36  12 - 46 (%)   Lymphs Abs 2.2  0.7 - 4.0 (K/uL)   Monocytes Relative 9  3 - 12 (%)   Monocytes Absolute 0.6  0.1 - 1.0 (K/uL)   Eosinophils Relative 2  0 - 5 (%)   Eosinophils Absolute 0.1  0.0 - 0.7 (K/uL)   Basophils Relative 0  0 - 1 (%)   Basophils Absolute 0.0  0.0 - 0.1 (K/uL)  COMPREHENSIVE METABOLIC PANEL      Component Value Range   Sodium 137  135 - 145 (mEq/L)   Potassium 3.4 (*) 3.5 - 5.1 (mEq/L)   Chloride 98  96 - 112 (mEq/L)   CO2 29  19 - 32 (mEq/L)   Glucose, Bld 103 (*) 70 - 99 (mg/dL)   BUN 13  6 - 23 (mg/dL)   Creatinine, Ser 0.86  0.50 - 1.10 (mg/dL)   Calcium 9.6  8.4 - 57.8 (mg/dL)   Total Protein 7.0  6.0 - 8.3 (g/dL)   Albumin 4.0  3.5 - 5.2 (g/dL)   AST 22  0 - 37 (U/L)   ALT 19  0 - 35 (U/L)   Alkaline Phosphatase 60  39 - 117 (U/L)   Total Bilirubin 0.3  0.3 - 1.2 (mg/dL)   GFR calc non Af Amer 70 (*) >90 (mL/min)   GFR calc Af Amer 81 (*) >90 (mL/min)  POCT I-STAT TROPONIN I      Component Value Range  Troponin i, poc 0.09 (*) 0.00 - 0.08 (ng/mL)   Comment 3           CARDIAC PANEL(CRET KIN+CKTOT+MB+TROPI)      Component Value Range   Total CK 87  7 - 177 (U/L)     CK, MB 3.6  0.3 - 4.0 (ng/mL)   Troponin I <0.30  <0.30 (ng/mL)   Relative Index RELATIVE INDEX IS INVALID  0.0 - 2.5   PROTIME-INR      Component Value Range   Prothrombin Time 13.4  11.6 - 15.2 (seconds)   INR 1.00  0.00 - 1.49   APTT      Component Value Range   aPTT 32  24 - 37 (seconds)      Date: 03/26/2012  Rate: 65  Rhythm: normal sinus rhythm  QRS Axis: normal  Intervals: normal  ST/T Wave abnormalities: nonspecific ST changes  Conduction Disutrbances:none  Narrative Interpretation: Nonspecific ST changes with very minimal ST depression in the inferior and anterolateral leads. When compared with ECG of 05/10/2009, nonspecific ST changes are now present, previously noted anterolateral T wave inversions have resolved.  Old EKG Reviewed: changes noted  Because of elevated cardiac markers ECG changes, and she was started on Heparin. Repeat cardiac markers have been ordered and case was discussed with Dr. Mayford Knife of cardiology who agrees to admit the patient.  1. Chest pain       MDM  Chest pain which is concerning for acute coronary syndrome. Normalization of the T waves on a CT could indicate ischemia. She'll be started on heparin and been given nitroglycerin and consideration for nitroglycerin ointment and/or drip. She will begin morphine if needed. Of note, elevated cardiac markers apparently were not brought to the attention of any physician. Repeat point-of-care troponins have been ordered again without notifying the physician. Her prior records were reviewed, and the last cardiology note was in October and did state that she had a negative stress echocardiogram in the previous year.        Dione Booze, MD 03/27/12 0030

## 2012-03-26 NOTE — ED Notes (Addendum)
Gave report to [place], [place] on 2900.  RN has no further questions or concerns; informed RN that she can call back with any questions once patient arrives to floor.  Preparing patient for transport.

## 2012-03-26 NOTE — ED Notes (Signed)
Received bedside report from [place], [place].  Patient currently sitting up in bed; no respiratory or acute distress noted.  Patient currently rates chest pain 2/10 on the numerical pain scale.  Updated patient on plan of care; informed patient that we are currently waiting on heparin to be delivered from pharmacy.  Patient has no other questions or concerns at this time; will continue to monitor.

## 2012-03-27 ENCOUNTER — Encounter (HOSPITAL_COMMUNITY): Payer: Self-pay | Admitting: *Deleted

## 2012-03-27 ENCOUNTER — Other Ambulatory Visit: Payer: Self-pay

## 2012-03-27 ENCOUNTER — Encounter (HOSPITAL_COMMUNITY)
Admission: EM | Disposition: A | Discharge status: Home / Self Care | Payer: Self-pay | Source: Home / Self Care | Attending: Cardiology

## 2012-03-27 DIAGNOSIS — I2510 Atherosclerotic heart disease of native coronary artery without angina pectoris: Secondary | ICD-10-CM

## 2012-03-27 DIAGNOSIS — R079 Chest pain, unspecified: Secondary | ICD-10-CM

## 2012-03-27 HISTORY — PX: LEFT HEART CATHETERIZATION WITH CORONARY ANGIOGRAM: SHX5451

## 2012-03-27 HISTORY — PX: PERCUTANEOUS CORONARY STENT INTERVENTION (PCI-S): SHX5485

## 2012-03-27 LAB — CARDIAC PANEL(CRET KIN+CKTOT+MB+TROPI)
Relative Index: 8 — ABNORMAL HIGH (ref 0.0–2.5)
Relative Index: 7 — ABNORMAL HIGH (ref 0.0–2.5)
Total CK: 765 U/L — ABNORMAL HIGH (ref 7–177)
Total CK: 743 U/L — ABNORMAL HIGH (ref 7–177)
Troponin I: >25 ng/mL (ref <0.30)

## 2012-03-27 LAB — D-DIMER, QUANTITATIVE: D-Dimer, Quant: 0.46 ug/mL-FEU (ref 0.00–0.48)

## 2012-03-27 LAB — BASIC METABOLIC PANEL
BUN: 10 mg/dL (ref 6–23)
Creatinine, Ser: 0.65 mg/dL (ref 0.50–1.10)
GFR calc non Af Amer: 87 mL/min — ABNORMAL LOW (ref >90)
Glucose, Bld: 127 mg/dL — ABNORMAL HIGH (ref 70–99)
Potassium: 3.7 mEq/L (ref 3.5–5.1)

## 2012-03-27 LAB — MRSA PCR SCREENING: MRSA by PCR: NEGATIVE

## 2012-03-27 SURGERY — LEFT HEART CATHETERIZATION WITH CORONARY ANGIOGRAM
Anesthesia: Moderate Sedation

## 2012-03-27 MED ORDER — PNEUMOCOCCAL VAC POLYVALENT 25 MCG/0.5ML IJ INJ
0.5000 mL | INJECTION | INTRAMUSCULAR | Status: AC
  Administered 2012-03-28: 0.5 mL via INTRAMUSCULAR
  Filled 2012-03-27: qty 0.5

## 2012-03-27 MED ORDER — TICAGRELOR 90 MG PO TABS
90.0000 mg | ORAL_TABLET | Freq: Two times a day (BID) | ORAL | Status: DC
  Administered 2012-03-28 – 2012-03-30 (×3): 90 mg via ORAL
  Filled 2012-03-27 (×5): qty 1

## 2012-03-27 MED ORDER — BIVALIRUDIN 250 MG IV SOLR
INTRAVENOUS | Status: AC
  Filled 2012-03-27: qty 250

## 2012-03-27 MED ORDER — MIDAZOLAM HCL 2 MG/2ML IJ SOLN
INTRAMUSCULAR | Status: AC
  Filled 2012-03-27: qty 2

## 2012-03-27 MED ORDER — ATROPINE SULFATE 1 MG/ML IJ SOLN
INTRAMUSCULAR | Status: AC
  Filled 2012-03-27: qty 1

## 2012-03-27 MED ORDER — TICAGRELOR 90 MG PO TABS
ORAL_TABLET | ORAL | Status: AC
  Filled 2012-03-27: qty 2

## 2012-03-27 MED ORDER — SODIUM CHLORIDE 0.9 % IV SOLN: 0.2500 mg/kg/h | INTRAVENOUS | Status: AC

## 2012-03-27 MED ORDER — ATORVASTATIN CALCIUM 80 MG PO TABS
80.0000 mg | ORAL_TABLET | Freq: Every day | ORAL | Status: DC
  Administered 2012-03-27 – 2012-03-29 (×3): 80 mg via ORAL
  Filled 2012-03-27 (×4): qty 1

## 2012-03-27 MED ORDER — BIOTENE DRY MOUTH MT LIQD
15.0000 mL | Freq: Two times a day (BID) | OROMUCOSAL | Status: DC
  Administered 2012-03-27 – 2012-03-29 (×5): 15 mL via OROMUCOSAL

## 2012-03-27 MED ORDER — SODIUM CHLORIDE 0.9 % IV SOLN
INTRAVENOUS | Status: AC
  Administered 2012-03-27: 03:00:00 via INTRAVENOUS

## 2012-03-27 NOTE — Interval H&P Note (Signed)
History and Physical Interval Note:  03/27/2012 12:32 AM  Carrie Chung  has presented today for cardiac cath with the diagnosis of chest pain  The various methods of treatment have been discussed with the patient and family. After consideration of risks, benefits and other options for treatment, the patient has consented to  Procedure(s) (LRB): LEFT HEART CATHETERIZATION WITH CORONARY ANGIOGRAM (N/A) as a surgical intervention .  The patients' history has been reviewed, patient examined, no change in status, stable for surgery.  I have reviewed the patients' chart and labs.  Questions were answered to the patient's satisfaction.     Falecia Vannatter

## 2012-03-27 NOTE — Progress Notes (Signed)
Called to see patient due to worsening chest pain.  Given morphine 2mg  with improvement in CP initially.  She then became diaphoretic with bradycardia down into the mid 40's with hypotension (SBP69mmHg) and complaining of worsening CP with N/V.  Nurse noted on monitor that ST segments appeared elevated in inferior leads.  EKG done shortly after that had a lot of baseline wandering artifact but T waves appeared peaked with new T wave inversion in the lateral leads.  Discussed with Dr. Sanjuana Kava.  Due to ongoing chest pain with hemodynamic instability will take emergently to the cath lab for cardiac catheterization.

## 2012-03-27 NOTE — CV Procedure (Addendum)
Cardiac Catheterization Operative Report  Carrie Chung [id number] 4/7/201312:42 AM Margaree Mackintosh, MD, MD  Procedure Performed:  1. Left Heart Catheterization 2. Selective Coronary Angiography 3. Left ventricular angiogram 4. PTCA/DES x 1 proximal to mid RCA  Operator: Verne Carrow, MD  Arterial access site:  Right radial artery.   Indication: The patient was admitted with chest pain. Her initial cardiac markers have been negative but she has had recurrent chest pain and question of transient inferior ST elevation on telemetry. EKG with non-specific ST segment changes. Over the last hour, she has had episodes of sinus bradycardia with ongoing nausea.  Urgent cath to exclude CAD.  This was not a STEMI.                      Procedure Details: The risks, benefits, complications, treatment options, and expected outcomes were discussed with the patient. The patient and/or family concurred with the proposed plan, giving informed consent. The patient was brought to the cath lab after IV hydration was begun. The patient was further sedated with Versed. The right wrist was assessed with an Allens test which was positive. The right wrist was prepped and draped in a sterile fashion. 1% lidocaine was used for local anesthesia. Using the modified Seldinger access technique, a 6 French sheath was placed in the right radial artery. 3 mg Verapamil was given through the sheath. 3500 units IV heparin was given. Standard diagnostic catheters were used to perform selective coronary angiography. 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 The RCA was engaged with a 6 Fr JR-4 guiding catheter. She was given a bolus of Angiomax and a drip was started. She was also given Brilinta 180 mg po x 1. When the ACT was greater than 200, a Cougar IC wire was advanced down the RCA. A 2.5 x 20 mm balloon was used to  pre-dilate the stenosis in the proximal to mid RCA. I then carefully positioned and deployed a 3.0 x 28 mm Promus Element DES extending from the proximal vessel into the mid vessel. At this time, she had a brief period of asystole. Atropine 1mg  given with return of sinus rhythm, rate 100 bpm. A 3.25 x 20 mm [place] balloon was used to post-dilate the stent. There was excellent flow into the distal vessel. The stenosis was taken from 99% down to 0%.  A pigtail catheter was used to perform a left ventricular angiogram. The sheath was removed from the right radial artery and a hemostasis band was applied at the arteriotomy site on the right wrist.    There were no immediate complications. The patient was taken to the recovery area in stable condition.   Hemodynamic Findings: Central aortic pressure: 122/70 Left ventricular pressure: 125/8/8  Angiographic Findings:  Left main:  No obstructive disease.   Left Anterior Descending Artery: Large vessel that courses to the apex. The proximal segment has diffuse 30% stenosis. The mid vessel has a 99% stenosis just after a small caliber diagonal branch.   Circumflex Artery: Moderate sized vessel with 95% mid stenosis. There is a moderate sized bifurcating OM branch with mild plaque disease.   Right Coronary Artery: Large, dominant vessel with 99% mid stenosis with hazy filling defect c/w thrombus.   Left Ventricular Angiogram: LVEF 65%.   Impression: 1. NSTEMI with severe stenosis mid RCA with thrombus now s/p successful  PTCA with DES x 1 mid RCA 2. Residual stenosis in the mid LAD and mid Circumflex. 3. Preserved LV systolic function.  Recommendations: Will need ASA and Brilinta for at least one year. Will continue Angiomax for two hours at reduced dose. Will add statin. She will need to be started on a beta blocker as tolerated. Will not start tonight with bradycardia.        Complications:  None. The patient tolerated the procedure well.

## 2012-03-27 NOTE — Progress Notes (Signed)
Patient ID: Carrie Chung, female   DOB: [birth year], 71 y.o.   MRN: [id number]    SUBJECTIVE: Very mild dull chest discomfort since procedure (much improved).  Otherwise feels ok.     . aspirin  324 mg Oral NOW  . aspirin EC  81 mg Oral Daily  . atorvastatin  80 mg Oral q1800  . atropine      . bivalirudin      . calcium-vitamin D  1 tablet Oral BID WC  . folic acid-pyridoxine-cyancobalamin  1 tablet Oral Daily  . heparin  3,500 Units Intravenous Once  . LORazepam  2 mg Oral QHS  . midazolam      . midazolam      . pantoprazole  80 mg Oral Q1200  . pneumococcal 23 valent vaccine  0.5 mL Intramuscular Tomorrow-1000  . potassium chloride SA  20 mEq Oral BID  . potassium chloride  40 mEq Oral Once  . Ticagrelor      . Ticagrelor  90 mg Oral BID  . DISCONTD: amLODipine  5 mg Oral Daily  . DISCONTD: aspirin  162 mg Oral Daily  . DISCONTD: atenolol  100 mg Oral Daily  . DISCONTD: atenolol-chlorthalidone  1 tablet Oral Daily  . DISCONTD: atorvastatin  10 mg Oral q1800  . DISCONTD: Calcium Carbonate-Vitamin D  1 tablet Oral BID  . DISCONTD: calcium-vitamin D  1 tablet Oral BID  . DISCONTD: chlorthalidone  25 mg Oral Daily  . DISCONTD: LORazepam  2 mg Oral QHS       Filed Vitals:   03/27/12 0630 03/27/12 0800 03/27/12 0900 03/27/12 1000  BP: 104/52 105/47 93/36 101/46  Pulse: 57 56 62 56  Temp:  98.7 F (37.1 C)    TempSrc:  Oral    Resp:      Height:      Weight:      SpO2: 97% 97% 96% 96%    Intake/Output Summary (Last 24 hours) at 03/27/12 1045 Last data filed at 03/27/12 0830  Gross per 24 hour  Intake 361.86 ml  Output   1350 ml  Net -988.14 ml    LABS: Basic Metabolic Panel:  Basename 03/27/12 0520 03/26/12 1655  NA 136 137  K 3.7 3.4*  CL 99 98  CO2 26 29  GLUCOSE 127* 103*  BUN 10 13  CREATININE 0.65 0.82  CALCIUM 9.1 9.6  MG -- --  PHOS -- --   Liver Function Tests:  Basename 03/26/12 1655  AST 22  ALT 19  ALKPHOS 60  BILITOT 0.3    PROT 7.0  ALBUMIN 4.0   No results found for this basename: LIPASE:2,AMYLASE:2 in the last 72 hours CBC:  Basename 03/26/12 1655  WBC 6.0  NEUTROABS 3.2  HGB 15.4*  HCT 43.7  MCV 92.2  PLT 162   Cardiac Enzymes:  Basename 03/27/12 0520 03/26/12 2050  CKTOTAL 765* 87  CKMB 61.1* 3.6  CKMBINDEX -- --  TROPONINI >25.00* <0.30   BNP: No components found with this basename: POCBNP:3 D-Dimer:  [facility] 03/26/12 2335  DDIMER 0.46   Hemoglobin A1C: No results found for this basename: HGBA1C in the last 72 hours Fasting Lipid Panel: No results found for this basename: CHOL,HDL,LDLCALC,TRIG,CHOLHDL,LDLDIRECT in the last 72 hours Thyroid Function Tests: No results found for this basename: TSH,T4TOTAL,FREET3,T3FREE,THYROIDAB in the last 72 hours Anemia Panel: No results found for this basename: VITAMINB12,FOLATE,FERRITIN,TIBC,IRON,RETICCTPCT in the last 72 hours  RADIOLOGY: No results found.  PHYSICAL EXAM General: NAD Neck:  No JVD, no thyromegaly or thyroid nodule.  Lungs: Clear to auscultation bilaterally with normal respiratory effort. CV: Nondisplaced PMI.  Heart regular S1/S2, no S3/S4, no murmur.  No peripheral edema.  No carotid bruit.  Normal pedal pulses.  Abdomen: Soft, nontender, no hepatosplenomegaly, no distention.  Neurologic: Alert and oriented x 3.  Psych: Normal affect. Extremities: No clubbing or cyanosis.   TELEMETRY: Reviewed telemetry pt in NSR  ASSESSMENT AND PLAN:  71 yo presented with NSTEMI, noted to have 99% mRCA with thrombus, 95% mCFX, 99% mLAD.  mRCA was culprit, treated with DES.  Suspect very mild residual pain may be from some distal microembolization.  ECG this am without ischemic changes.  EF preserved on LV-gram.  She has 2 residual high grade coronary stenoses that would be expected to cause ischemia.  Would favor intervention on these lesions, would let her recover for 24-48 hours, however.   - Discuss with Dr. Excell Seltzer => would favor  intervention CFX/LAD perhaps on Tuesday.  - Echo  - ASA 81, Brilinta, atorva 80 - Got Norvasc this am, will hold chlorthalidone with SBP 100.  - Would follow vitals today, try to start low dose beta blocker and ACEI tomorrow (will stop Norvasc in favor of ACEI).   Carrie Chung 03/27/2012 10:51 AM

## 2012-03-28 DIAGNOSIS — I214 Non-ST elevation (NSTEMI) myocardial infarction: Secondary | ICD-10-CM | POA: Diagnosis present

## 2012-03-28 DIAGNOSIS — I517 Cardiomegaly: Secondary | ICD-10-CM

## 2012-03-28 LAB — CBC
Hemoglobin: 14.7 g/dL (ref 12.0–15.0)
MCH: 32.4 pg (ref 26.0–34.0)
Platelets: 149 10*3/uL — ABNORMAL LOW (ref 150–400)
RBC: 4.54 MIL/uL (ref 3.87–5.11)
WBC: 5.8 10*3/uL (ref 4.0–10.5)

## 2012-03-28 LAB — LIPID PANEL
Cholesterol: 204 mg/dL — ABNORMAL HIGH (ref 0–200)
HDL: 46 mg/dL (ref >39)
Total CHOL/HDL Ratio: 4.4 RATIO
Triglycerides: 169 mg/dL — ABNORMAL HIGH (ref <150)
VLDL: 34 mg/dL (ref 0–40)

## 2012-03-28 LAB — BASIC METABOLIC PANEL
CO2: 33 mEq/L — ABNORMAL HIGH (ref 19–32)
Calcium: 9.9 mg/dL (ref 8.4–10.5)
GFR calc non Af Amer: 59 mL/min — ABNORMAL LOW (ref >90)
Glucose, Bld: 118 mg/dL — ABNORMAL HIGH (ref 70–99)
Potassium: 4.2 mEq/L (ref 3.5–5.1)
Sodium: 141 mEq/L (ref 135–145)

## 2012-03-28 LAB — POCT ACTIVATED CLOTTING TIME: Activated Clotting Time: 700 seconds

## 2012-03-28 LAB — GLUCOSE, CAPILLARY

## 2012-03-28 MED ORDER — INSULIN ASPART 100 UNIT/ML ~~LOC~~ SOLN: 0.0000 [IU] | Freq: Three times a day (TID) | SUBCUTANEOUS | Status: DC

## 2012-03-28 MED ORDER — HEPARIN BOLUS VIA INFUSION
2000.0000 [IU] | Freq: Once | INTRAVENOUS | Status: AC
  Administered 2012-03-28: 2000 [IU] via INTRAVENOUS
  Filled 2012-03-28: qty 2000

## 2012-03-28 MED ORDER — SODIUM CHLORIDE 0.9 % IJ SOLN: 3.0000 mL | INTRAMUSCULAR | Status: DC | PRN

## 2012-03-28 MED ORDER — SODIUM CHLORIDE 0.9 % IV SOLN: 250.0000 mL | INTRAVENOUS | Status: DC | PRN

## 2012-03-28 MED ORDER — HEPARIN (PORCINE) IN NACL 100-0.45 UNIT/ML-% IJ SOLN
1000.0000 [IU]/h | INTRAMUSCULAR | Status: DC
  Administered 2012-03-28: 1000 [IU]/h via INTRAVENOUS
  Filled 2012-03-28 (×2): qty 250

## 2012-03-28 MED ORDER — DIAZEPAM 5 MG PO TABS
5.0000 mg | ORAL_TABLET | ORAL | Status: DC
  Filled 2012-03-28: qty 1

## 2012-03-28 MED ORDER — SODIUM CHLORIDE 0.9 % IJ SOLN: 3.0000 mL | Freq: Two times a day (BID) | INTRAMUSCULAR | Status: DC

## 2012-03-28 MED ORDER — ASPIRIN 81 MG PO CHEW
324.0000 mg | CHEWABLE_TABLET | ORAL | Status: AC
  Administered 2012-03-29: 324 mg via ORAL
  Filled 2012-03-28: qty 4

## 2012-03-28 MED FILL — Dextrose Inj 5%: INTRAVENOUS | Qty: 50 | Status: AC

## 2012-03-28 NOTE — Plan of Care (Signed)
Problem: Food- and Nutrition-Related Knowledge Deficit (NB-1.1) Goal: Nutrition education Formal process to instruct or train a patient/client in a skill or to impart knowledge to help patients/clients voluntarily manage or modify food choices and eating behavior to maintain or improve health.  Outcome: Completed/Met Date Met:  03/28/12 RD was consulted for diet education "borderline diabetic." HgbA1c is 5.7%, CBGs have been slightly elevated. Pt is worried about being diabetic. Pt has tried in the past to lose weight but has now gained some of it back, states this is from "binge eating" more accurately over eating on trigger foods such as french fries. RD spoke with pt make strategies for dealing with stress, hunger, and temptation. RD advised pt the best defense against continuing towards DM is weight loss. Pt plans to attend the Cardiac Rehab program and plans to discuss challenges with the RD in that program. All pt's questions were answered. No additional nutrition interventions at this time. Please re-consult if needed.  Lab Results  Component Value Date    HGBA1C 5.7 03/09/2012   CBG (last 3)   Basename 03/28/12 1225  GLUCAP 94    Body mass index is 28.35 kg/(m^2). Overweight  KOWALSKI, Marissah Vandemark MARIE

## 2012-03-28 NOTE — Progress Notes (Signed)
*  PRELIMINARY RESULTS* Echocardiogram 2D Echocardiogram with Definity has been performed.  Glean Salen [facility] 03/28/2012, 3:05 PM

## 2012-03-28 NOTE — Progress Notes (Signed)
Cardiac Rehab [phone] Started MI education with pt.and husband. Pt agrees to Outpt. CRP in [place], will send referral. Will continue to follow pt to continue education and ambulate when appropriate.

## 2012-03-28 NOTE — Progress Notes (Addendum)
ANTICOAGULATION CONSULT NOTE - Initial Consult  Pharmacy Consult for Heparin Indication: chest pain/ACS  Allergies  Allergen Reactions  . Penicillins Hives and Swelling  . Sulfonamide Derivatives Hives and Swelling  . Tetracycline Hives and Swelling  . Zinc Oxide Rash    Patient Measurements: Height: 5\' 2"  (157.5 cm) Weight: 154 lb 15.7 oz (70.3 kg) IBW/kg (Calculated) : 50.1  Heparin Dosing Weight:    Vital Signs: Temp: 98.3 F (36.8 C) (04/08 1200) Temp src: Oral (04/08 1200) BP: 135/67 mmHg (04/08 1200) Pulse Rate: 78  (04/08 1200)  Labs:  Basename 03/28/12 0535 03/27/12 1103 03/27/12 0520 03/26/12 2050 03/26/12 1855 03/26/12 1655  HGB 14.7 -- -- -- -- 15.4*  HCT 43.0 -- -- -- -- 43.7  PLT 149* -- -- -- -- 162  APTT -- -- -- -- 32 --  LABPROT -- -- -- -- 13.4 --  INR -- -- -- -- 1.00 --  HEPARINUNFRC -- -- -- -- -- --  CREATININE 0.95 -- 0.65 -- -- 0.82  CKTOTAL -- 743* 765* 87 -- --  CKMB -- 52.2* 61.1* 3.6 -- --  TROPONINI -- 22.16* >25.00* <0.30 -- --   Estimated Creatinine Clearance: 49.9 ml/min (by C-G formula based on Cr of 0.95).  Medical History: Past Medical History  Diagnosis Date  . SOB (shortness of breath)     on exertion  . Hypokalemia   . HTN (hypertension)     essential  . Sleep apnea   . Endometrial polyp    Assessment: NSTEMI with PTCA/DES x 1 proximal to mid RCA. Needs further intervention of Cfx/LAD. EF 65%. Restart IV heparin for CP and plans for re-cath tomorrow.  Cards: HTN/HLD. Meds: Lipitor, IV Ntg, K, Brilinta  Endo: SSI  Goal of Therapy:  INR 2-3   Plan:  Heparin (1/2 bolus 2000 units) at 1000 units/hr. Check heparin level in 6 hrs and in am.  Pasty Spillers 03/28/2012,3:51 PM

## 2012-03-28 NOTE — Progress Notes (Signed)
ANTICOAGULATION CONSULT NOTE - Initial Consult  Pharmacy Consult for Heparin Indication: chest pain/ACS  Allergies  Allergen Reactions  . Penicillins Hives and Swelling  . Sulfonamide Derivatives Hives and Swelling  . Tetracycline Hives and Swelling  . Zinc Oxide Rash    Patient Measurements: Height: 5\' 2"  (157.5 cm) Weight: 154 lb 15.7 oz (70.3 kg) IBW/kg (Calculated) : 50.1   Vital Signs: Temp: 98.7 F (37.1 C) (04/08 2005) Temp src: Oral (04/08 2005) BP: 145/69 mmHg (04/08 2005) Pulse Rate: 69  (04/08 2005)  Labs:  Basename 03/28/12 2241 03/28/12 0535 03/27/12 1103 03/27/12 0520 03/26/12 2050 03/26/12 1855 03/26/12 1655  HGB -- 14.7 -- -- -- -- 15.4*  HCT -- 43.0 -- -- -- -- 43.7  PLT -- 149* -- -- -- -- 162  APTT -- -- -- -- -- 32 --  LABPROT -- -- -- -- -- 13.4 --  INR -- -- -- -- -- 1.00 --  HEPARINUNFRC 0.64 -- -- -- -- -- --  CREATININE -- 0.95 -- 0.65 -- -- 0.82  CKTOTAL -- -- 743* 765* 87 -- --  CKMB -- -- 52.2* 61.1* 3.6 -- --  TROPONINI -- -- 22.16* >25.00* <0.30 -- --   Estimated Creatinine Clearance: 49.9 ml/min (by C-G formula based on Cr of 0.95).  Assessment:  71 yo female with NSTEMI s/p PCI to RCA today, awaiting  LAD intervention tomorrow, for Heparin  Goal of Therapy:  Heparin level 0.3-0.7   Plan:  Continue Heparin at current rate Follow-up am labs.  Eddie Candle 03/28/2012,11:33 PM

## 2012-03-28 NOTE — Progress Notes (Signed)
Noted pt with residual dz and some angina yesterday. Please advise on ambulation for today. Thx. Will f/u. Ethelda Chick CES, ACSM

## 2012-03-28 NOTE — Progress Notes (Signed)
Patient Name: Carrie Chung Date of Encounter: 03/28/2012  Principal Problem:  *Non-ST elevation myocardial infarction (NSTEMI), initial care episode Active Problems:  HYPERTENSION, BENIGN   SUBJECTIVE: Had chest pain yesterday which resolved. Got up to BR today and was exhausted going back to bed and then got chest pain, 1/10 but left-sided and similar to angina.   OBJECTIVE Filed Vitals:   03/27/12 2350 03/28/12 0313 03/28/12 0315 03/28/12 0800  BP: 100/57 105/51 105/51   Pulse: 60  55   Temp:  98.1 F (36.7 C)  98.1 F (36.7 C)  TempSrc:  Oral  Oral  Resp:      Height:      Weight:      SpO2: 98%  97%     Intake/Output Summary (Last 24 hours) at 03/28/12 1025 Last data filed at 03/28/12 0800  Gross per 24 hour  Intake    820 ml  Output   1900 ml  Net  -1080 ml   Weight change:  Wt Readings from Last 3 Encounters:  03/27/12 154 lb 15.7 oz (70.3 kg)  03/27/12 154 lb 15.7 oz (70.3 kg)  09/29/11 155 lb (70.308 kg)     PHYSICAL EXAM General: Well developed, well nourished, in no acute distress. Head: Normocephalic, atraumatic.  Neck: Supple without bruits, JVD at 8 cm. Lungs:  Resp regular and unlabored, few rales bases. Heart: RRR, S1, S2, no S3, S4, or murmurs. Abdomen: Soft, non-tender, non-distended, BS + x 4.  Extremities: No clubbing, cyanosis, no edema.  Neuro: Alert and oriented X 3. Moves all extremities spontaneously. Psych: Normal affect.  LABS:  CBC: Basename 03/28/12 0535 03/26/12 1655  WBC 5.8 6.0  NEUTROABS -- 3.2  HGB 14.7 15.4*  HCT 43.0 43.7  MCV 94.7 92.2  PLT 149* 162   INR: Basename 03/26/12 1855  INR 1.00   Basic Metabolic Panel: Basename 03/28/12 0535 03/27/12 0520  NA 141 136  K 4.2 3.7  CL 102 99  CO2 33* 26  GLUCOSE 118* 127*  BUN 12 10  CREATININE 0.95 0.65  CALCIUM 9.9 9.1  MG -- --  PHOS -- --   Liver Function Tests: Basename 03/26/12 1655  AST 22  ALT 19  ALKPHOS 60  BILITOT 0.3  PROT 7.0    ALBUMIN 4.0    Cardiac Enzymes: Basename 03/27/12 1103 03/27/12 0520 03/26/12 2050  CKTOTAL 743* 765* 87  CKMB 52.2* 61.1* 3.6  CKMBINDEX -- -- --  TROPONINI 22.16* >25.00* <0.30    Basename 03/26/12 2335  DDIMER 0.46   Hemoglobin A1C: Fasting Lipid Panel: Thyroid Function Tests: Basename 03/27/12 1103  TSH 1.147  T4TOTAL --  T3FREE --  THYROIDAB --    TELE:  SR with PVCs and a 5 bt run of NSVT      Radiology/Studies: No results found.  Current Medications:     . antiseptic oral rinse  15 mL Mouth Rinse BID  . aspirin EC  81 mg Oral Daily  . atorvastatin  80 mg Oral q1800  . atropine      . calcium-vitamin D  1 tablet Oral BID WC  . folic acid-pyridoxine-cyancobalamin  1 tablet Oral Daily  . LORazepam  2 mg Oral QHS  . pantoprazole  80 mg Oral Q1200  . pneumococcal 23 valent vaccine  0.5 mL Intramuscular Tomorrow-1000  . potassium chloride SA  20 mEq Oral BID  . Ticagrelor  90 mg Oral BID     . nitroGLYCERIN Stopped (03/26/12 2340)  ASSESSMENT AND PLAN: Principal Problem:  *Non-ST elevation myocardial infarction (NSTEMI), initial care episode - s/p DES to RCA, MD advise on re-cath for LAD/CFx disease. EF 65% - need Rx for angina, will try very low dose ntg gtt.  Active Problems:  HYPERTENSION, BENIGN - Rx held and BP low now. Follow  Lipids - not on a statin PTA. Ck profile  Hyperglycemia - possibly secondary to MI, ck A1c follow CBGs.  Signed, Theodore Demark , PA-C 10:25 AM 03/28/2012   Patient seen, examined. Available data reviewed. Agree with findings, assessment, and plan as outlined by Theodore Demark, PA-C. I have reviewed the patient's cardiac cath films. 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 She has had some recurrence of mild chest pain over the last 24 hours. I am going to start her back on IV heparin and we will plan on 2 vessel PCI tomorrow morning. I reviewed the risks, indications, and  alternatives with the patient and her daughter in detail. We discussed the pros and cons of multivessel PCI versus coronary bypass surgery in the setting of a patient with preserved left ventricular function. I feel the main benefit to CABG is with reduced risk of recurrent angina and repeat revascularization procedures. They understand and would like to proceed with PCI. Fortunately the patient's disease is focal in both vessels and should be well treated with stents  Tonny Bollman, M.D. 03/28/2012 1:33 PM

## 2012-03-29 ENCOUNTER — Encounter (HOSPITAL_COMMUNITY)
Admission: EM | Disposition: A | Discharge status: Home / Self Care | Payer: Self-pay | Source: Home / Self Care | Attending: Cardiology

## 2012-03-29 DIAGNOSIS — I2510 Atherosclerotic heart disease of native coronary artery without angina pectoris: Secondary | ICD-10-CM

## 2012-03-29 HISTORY — PX: PERCUTANEOUS CORONARY STENT INTERVENTION (PCI-S): SHX5485

## 2012-03-29 LAB — CBC
HCT: 42.7 % (ref 36.0–46.0)
Hemoglobin: 14.9 g/dL (ref 12.0–15.0)
MCHC: 34.9 g/dL (ref 30.0–36.0)

## 2012-03-29 LAB — GLUCOSE, CAPILLARY
Glucose-Capillary: 101 mg/dL — ABNORMAL HIGH (ref 70–99)
Glucose-Capillary: 76 mg/dL (ref 70–99)

## 2012-03-29 LAB — CK TOTAL AND CKMB (NOT AT ARMC)
CK, MB: 5.9 ng/mL — ABNORMAL HIGH (ref 0.3–4.0)
Relative Index: 4.3 — ABNORMAL HIGH (ref 0.0–2.5)
Total CK: 102 U/L (ref 7–177)
Total CK: 137 U/L (ref 7–177)

## 2012-03-29 LAB — HEPARIN LEVEL (UNFRACTIONATED): Heparin Unfractionated: 0.79 IU/mL — ABNORMAL HIGH (ref 0.30–0.70)

## 2012-03-29 LAB — POCT ACTIVATED CLOTTING TIME: Activated Clotting Time: 342 seconds

## 2012-03-29 SURGERY — PERCUTANEOUS CORONARY STENT INTERVENTION (PCI-S)
Anesthesia: LOCAL

## 2012-03-29 MED ORDER — SODIUM CHLORIDE 0.9 % IV SOLN: 250.0000 mL | INTRAVENOUS | Status: DC

## 2012-03-29 MED ORDER — BIVALIRUDIN 250 MG IV SOLR
INTRAVENOUS | Status: AC
  Filled 2012-03-29: qty 250

## 2012-03-29 MED ORDER — ONDANSETRON HCL 4 MG/2ML IJ SOLN: 4.0000 mg | Freq: Four times a day (QID) | INTRAMUSCULAR | Status: DC | PRN

## 2012-03-29 MED ORDER — FENTANYL CITRATE 0.05 MG/ML IJ SOLN
INTRAMUSCULAR | Status: AC
  Filled 2012-03-29: qty 2

## 2012-03-29 MED ORDER — SODIUM CHLORIDE 0.9 % IJ SOLN: 3.0000 mL | INTRAMUSCULAR | Status: DC | PRN

## 2012-03-29 MED ORDER — MIDAZOLAM HCL 2 MG/2ML IJ SOLN
INTRAMUSCULAR | Status: AC
  Filled 2012-03-29: qty 2

## 2012-03-29 MED ORDER — ACETAMINOPHEN 325 MG PO TABS
650.0000 mg | ORAL_TABLET | ORAL | Status: DC | PRN
  Administered 2012-03-29: 650 mg via ORAL
  Filled 2012-03-29: qty 2

## 2012-03-29 MED ORDER — SODIUM CHLORIDE 0.9 % IJ SOLN
3.0000 mL | Freq: Two times a day (BID) | INTRAMUSCULAR | Status: DC
  Administered 2012-03-29: 3 mL via INTRAVENOUS

## 2012-03-29 MED ORDER — METOPROLOL TARTRATE 25 MG PO TABS
25.0000 mg | ORAL_TABLET | Freq: Two times a day (BID) | ORAL | Status: DC
  Administered 2012-03-29 – 2012-03-30 (×2): 25 mg via ORAL
  Filled 2012-03-29 (×3): qty 1

## 2012-03-29 MED ORDER — SODIUM CHLORIDE 0.9 % IV SOLN
1.0000 mL/kg/h | INTRAVENOUS | Status: AC
  Administered 2012-03-29: 1 mL/kg/h via INTRAVENOUS

## 2012-03-29 MED FILL — Perflutren Lipid Microsphere IV Susp 6.52 MG/ML: INTRAVENOUS | Qty: 2 | Status: AC

## 2012-03-29 NOTE — H&P (View-Only) (Signed)
 Patient Name: Carrie Chung Date of Encounter: 03/28/2012  Principal Problem:  *Non-ST elevation myocardial infarction (NSTEMI), initial care episode Active Problems:  HYPERTENSION, BENIGN   SUBJECTIVE: Had chest pain yesterday which resolved. Got up to BR today and was exhausted going back to bed and then got chest pain, 1/10 but left-sided and similar to angina.   OBJECTIVE Filed Vitals:   03/27/12 2350 03/28/12 0313 03/28/12 0315 03/28/12 0800  BP: 100/57 105/51 105/51   Pulse: 60  55   Temp:  98.1 F (36.7 C)  98.1 F (36.7 C)  TempSrc:  Oral  Oral  Resp:      Height:      Weight:      SpO2: 98%  97%     Intake/Output Summary (Last 24 hours) at 03/28/12 1025 Last data filed at 03/28/12 0800  Gross per 24 hour  Intake    820 ml  Output   1900 ml  Net  -1080 ml   Weight change:  Wt Readings from Last 3 Encounters:  03/27/12 154 lb 15.7 oz (70.3 kg)  03/27/12 154 lb 15.7 oz (70.3 kg)  09/29/11 155 lb (70.308 kg)     PHYSICAL EXAM General: Well developed, well nourished, in no acute distress. Head: Normocephalic, atraumatic.  Neck: Supple without bruits, JVD at 8 cm. Lungs:  Resp regular and unlabored, few rales bases. Heart: RRR, S1, S2, no S3, S4, or murmurs. Abdomen: Soft, non-tender, non-distended, BS + x 4.  Extremities: No clubbing, cyanosis, no edema.  Neuro: Alert and oriented X 3. Moves all extremities spontaneously. Psych: Normal affect.  LABS:  CBC: Basename 03/28/12 0535 03/26/12 1655  WBC 5.8 6.0  NEUTROABS -- 3.2  HGB 14.7 15.4*  HCT 43.0 43.7  MCV 94.7 92.2  PLT 149* 162   INR: Basename 03/26/12 1855  INR 1.00   Basic Metabolic Panel: Basename 03/28/12 0535 03/27/12 0520  NA 141 136  K 4.2 3.7  CL 102 99  CO2 33* 26  GLUCOSE 118* 127*  BUN 12 10  CREATININE 0.95 0.65  CALCIUM 9.9 9.1  MG -- --  PHOS -- --   Liver Function Tests: Basename 03/26/12 1655  AST 22  ALT 19  ALKPHOS 60  BILITOT 0.3  PROT 7.0    ALBUMIN 4.0    Cardiac Enzymes: Basename 03/27/12 1103 03/27/12 0520 03/26/12 2050  CKTOTAL 743* 765* 87  CKMB 52.2* 61.1* 3.6  CKMBINDEX -- -- --  TROPONINI 22.16* >25.00* <0.30    Basename 03/26/12 2335  DDIMER 0.46   Hemoglobin A1C: Fasting Lipid Panel: Thyroid Function Tests: Basename 03/27/12 1103  TSH 1.147  T4TOTAL --  T3FREE --  THYROIDAB --    TELE:  SR with PVCs and a 5 bt run of NSVT      Radiology/Studies: No results found.  Current Medications:     . antiseptic oral rinse  15 mL Mouth Rinse BID  . aspirin EC  81 mg Oral Daily  . atorvastatin  80 mg Oral q1800  . atropine      . calcium-vitamin D  1 tablet Oral BID WC  . folic acid-pyridoxine-cyancobalamin  1 tablet Oral Daily  . LORazepam  2 mg Oral QHS  . pantoprazole  80 mg Oral Q1200  . pneumococcal 23 valent vaccine  0.5 mL Intramuscular Tomorrow-1000  . potassium chloride SA  20 mEq Oral BID  . Ticagrelor  90 mg Oral BID     . nitroGLYCERIN Stopped (03/26/12 2340)       ASSESSMENT AND PLAN: Principal Problem:  *Non-ST elevation myocardial infarction (NSTEMI), initial care episode - s/p DES to RCA, MD advise on re-cath for LAD/CFx disease. EF 65% - need Rx for angina, will try very low dose ntg gtt.  Active Problems:  HYPERTENSION, BENIGN - Rx held and BP low now. Follow  Lipids - not on a statin PTA. Ck profile  Hyperglycemia - possibly secondary to MI, ck A1c follow CBGs.  Signed, Rhonda Barrett , PA-C 10:25 AM 03/28/2012   Patient seen, examined. Available data reviewed. Agree with findings, assessment, and plan as outlined by Rhonda Barrett, PA-C. I have reviewed the patient's cardiac cath films. 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 She has had some recurrence of mild chest pain over the last 24 hours. I am going to start her back on IV heparin and we will plan on 2 vessel PCI tomorrow morning. I reviewed the risks, indications, and  alternatives with the patient and her daughter in detail. We discussed the pros and cons of multivessel PCI versus coronary bypass surgery in the setting of a patient with preserved left ventricular function. I feel the main benefit to CABG is with reduced risk of recurrent angina and repeat revascularization procedures. They understand and would like to proceed with PCI. Fortunately the patient's disease is focal in both vessels and should be well treated with stents  Raelyn Racette, M.D. 03/28/2012 1:33 PM   

## 2012-03-29 NOTE — Interval H&P Note (Signed)
History and Physical Interval Note:  03/29/2012 7:37 AM  Carrie Chung  has presented today for surgery, with the diagnosis of residual disease  The various methods of treatment have been discussed with the patient and family. After consideration of risks, benefits and other options for treatment, the patient has consented to  Procedure(s) (LRB): PERCUTANEOUS CORONARY STENT INTERVENTION (PCI-S) (N/A) as a surgical intervention .  The patients' history has been reviewed, patient examined, no change in status, stable for surgery.  I have reviewed the patients' chart and labs.  Questions were answered to the patient's satisfaction.     Tonny Bollman  03/29/2012 7:37 AM

## 2012-03-29 NOTE — Research (Signed)
EVOLVE II  Informed Consent   Subject Name: TAWNA ALWIN  Subject met inclusion and exclusion criteria.  The informed consent form, study requirements and expectations were reviewed with the subject and questions and concerns were addressed prior to the signing of the consent form.  The subject verbalized understanding of the trail requirements.  The subject agreed to participate in the EVOLVE II trial and signed the informed consent.  The informed consent was obtained prior to performance of any protocol-specific procedures for the subject.  A copy of the signed informed consent was given to the subject and a copy was placed in the subject's medical record.  Cherrie Distance Jr. 03/29/2012, 9:08 AM

## 2012-03-29 NOTE — CV Procedure (Signed)
   CARDIAC CATH NOTE  Name: Carrie Chung MRN: [id number] DOB: [birth year]  Procedure: PTCA and stenting of the mid LAD and proximal left circumflex  Indication: Non-ST elevation MI. This is a 71 year old woman who presented with non-ST elevation infarction and ongoing angina refractory to aggressive medical therapy. She underwent PCI of a critical lesion in the mid right coronary artery. She was noted to have severe stenoses in focal areas of the mid LAD and proximal circumflex. She presents today for staged intervention. She has been adequately preloaded with ticagrelor. The patient was randomized as part of the Evolve II study, comparing the Promus Element stent to the Synergy stent (biodegradable polymer) in 1:1 fashion.  Procedural Details: The right wrist was prepped, draped, and anesthetized with 1% lidocaine. Using the modified Seldinger technique, a 6 Fr sheath was introduced into the radial artery. 2.5 mg of nicardipine was administered through the radial sheath. Weight-based bivalirudin was given for anticoagulation. Once a therapeutic ACT was achieved, a 6 [place] XB LAD 3.0 guide catheter was inserted. Attention was first turned to the mid LAD. There is a 95-99% stenosis just after the second diagonal. A Cougar coronary guidewire was used to cross the lesion. The wire was occlusive with TIMI 1 flow after wiring the lesion. The lesion was predilated with a 2.0 x 12 mm balloon.  The lesion was then stented with a 2.5 x 12 mm Evolve study  stent.  The stent was postdilated with a 2.75x8 noncompliant balloon.  Following PCI, there was 0% residual stenosis and TIMI-3 flow. Attention was then turned to the left circumflex. There is a severe 95% stenosis in the proximal vessel. The wire was redirected across the lesion into the first obtuse marginal branch. The vessel was predilated with the same 2.0 x 12 mm balloon taken to 8 atmospheres. The lesion was very focal and I elected to treat it with a  2.5 x 8 mm Evolve Study stent. The stent was then postdilated with a 2.75 x 6 mm noncompliant balloon taken to 16 atmospheres with an excellent final result. There is 0% residual stenosis and TIMI-3 flow. Final angiography confirmed an excellent result. The patient tolerated the procedure well. There were no immediate procedural complications. A TR band was used for radial hemostasis. The patient was transferred to the post catheterization recovery area for further monitoring.  Lesion Data (lesion 1) Vessel: LAD Percent stenosis (pre): 99 TIMI-flow (pre):  3 Stent:  2.5x12 mm Evolve II study stent (DES) Percent stenosis (post): 0 TIMI-flow (post): 3  Lesion Data (lesion 2) Vessel: LCx Percent stenosis (pre): 90 TIMI-flow (pre): 3 Stent: 2.5x8 mm Evolve II study stent (DES) Percent stenosis (post): 0 TIMI-flow (post): 3  Conclusions:  Successful 2 vessel PCI as above  Recommendations: ASA 81 mg and Brilinta 90 mg BID x minimum 12 months. Aggressive risk reduction.  Tonny Bollman 03/29/2012, 8:56 AM

## 2012-03-29 NOTE — Progress Notes (Signed)
UR Completed. Simmons, Chauntelle Azpeitia F [phone]  

## 2012-03-30 ENCOUNTER — Encounter (HOSPITAL_COMMUNITY): Payer: Self-pay | Admitting: Physician Assistant

## 2012-03-30 LAB — BASIC METABOLIC PANEL
BUN: 13 mg/dL (ref 6–23)
Chloride: 103 mEq/L (ref 96–112)
GFR calc Af Amer: 77 mL/min — ABNORMAL LOW (ref >90)
GFR calc non Af Amer: 66 mL/min — ABNORMAL LOW (ref >90)
Potassium: 3.7 mEq/L (ref 3.5–5.1)

## 2012-03-30 LAB — CBC
HCT: 39.5 % (ref 36.0–46.0)
Hemoglobin: 13.5 g/dL (ref 12.0–15.0)
MCH: 32.1 pg (ref 26.0–34.0)
MCHC: 34.2 g/dL (ref 30.0–36.0)
MCV: 93.8 fL (ref 78.0–100.0)
Platelets: 145 10*3/uL — ABNORMAL LOW (ref 150–400)
RBC: 4.21 MIL/uL (ref 3.87–5.11)
RDW: 12.5 % (ref 11.5–15.5)
WBC: 5.2 10*3/uL (ref 4.0–10.5)

## 2012-03-30 MED ORDER — POTASSIUM CHLORIDE CRYS ER 20 MEQ PO TBCR: 20.0000 meq | EXTENDED_RELEASE_TABLET | Freq: Every day | ORAL | Status: DC

## 2012-03-30 MED ORDER — DIPHENHYDRAMINE HCL 25 MG PO CAPS: 25.0000 mg | ORAL_CAPSULE | Freq: Four times a day (QID) | ORAL | Status: DC | PRN

## 2012-03-30 MED ORDER — ATORVASTATIN CALCIUM 80 MG PO TABS: 80.0000 mg | ORAL_TABLET | Freq: Every day | ORAL | Status: DC

## 2012-03-30 MED ORDER — METOPROLOL TARTRATE 25 MG PO TABS: 25.0000 mg | ORAL_TABLET | Freq: Two times a day (BID) | ORAL | Status: DC

## 2012-03-30 MED ORDER — NITROGLYCERIN 0.4 MG SL SUBL: 0.4000 mg | SUBLINGUAL_TABLET | SUBLINGUAL | Status: DC | PRN

## 2012-03-30 MED ORDER — ASPIRIN 81 MG PO TABS: 81.0000 mg | ORAL_TABLET | Freq: Every day | ORAL | Status: DC

## 2012-03-30 MED ORDER — TICAGRELOR 90 MG PO TABS: 90.0000 mg | ORAL_TABLET | Freq: Two times a day (BID) | ORAL | Status: DC

## 2012-03-30 MED FILL — Nicardipine HCl IV Soln 2.5 MG/ML: INTRAVENOUS | Qty: 1 | Status: AC

## 2012-03-30 MED FILL — Dextrose Inj 5%: INTRAVENOUS | Qty: 50 | Status: AC

## 2012-03-30 NOTE — Progress Notes (Signed)
Pt quit smoking 30 years ago.

## 2012-03-30 NOTE — Progress Notes (Signed)
CARDIAC REHAB PHASE I   PRE:  Rate/Rhythm: 89 SR  BP:  Supine:   Sitting: 134/60  Standing:    SaO2:   MODE:  Ambulation: 340 ft   POST:  Rate/Rhythem: 95 SR  BP:  Supine:   Sitting: 143/60  Standing:    SaO2:  0935-1010 Tolerated ambulation well without c/o of cp or SOB. VS stable. Completed discharge education with pt and husband. Pt voices understanding.  Carrie Chung

## 2012-03-30 NOTE — Progress Notes (Signed)
Patient: Carrie Chung Date of Encounter: 03/30/2012, 8:39 AM Admit date: 03/26/2012     Subjective  No CP, SOB. Feeling well. She feels bad because she "snapped" at the nurse tech earlier. She hasn't gotten much rest over the last few days & has felt moodier than usual.  She reports that the rash on her back is slightly itchy but improving. She thinks it might be a "sweat rash" because of profuse diaphoresis earlier on in her hospitalization and laying on her back this whole time. She reports that it started on Sunday, and this was prior to initiation of Brilinta.   Objective   Telemetry: NSR mostly 70s. Occasional SB (50s-60s at lowest) Physical Exam: Filed Vitals:   03/30/12 0442  BP: 143/64  Pulse: 74  Temp: 98.3 F (36.8 C)  Resp: 17   General: Well developed, well nourished, in no acute distress. Head: Normocephalic, atraumatic, sclera non-icteric, no xanthomas, nares are without discharge.  Neck: JVD not elevated. Lungs: Clear bilaterally to auscultation without wheezes, rales, or rhonchi. Breathing is unlabored. Skin: She has a diffuse small papular rash over her back. Heart: RRR S1 S2 without murmurs, rubs, or gallops.  Abdomen: Soft, non-tender, non-distended with normoactive bowel sounds. No hepatomegaly. No rebound/guarding. No obvious abdominal masses. Msk:  Strength and tone appear normal for age. Extremities: No clubbing or cyanosis. No edema.  Distal pedal pulses are 1+ and equal bilaterally. R wrist with radial dressing in place, full ROM, mild proximal ecchymosis that appears to be in initial stage of resolution Neuro: Alert and oriented X 3. Moves all extremities spontaneously. Psych:  Responds to questions appropriately with a normal affect.    Intake/Output Summary (Last 24 hours) at 03/30/12 0839 Last data filed at 03/29/12 2230  Gross per 24 hour  Intake   1020 ml  Output      0 ml  Net   1020 ml    Inpatient Medications:    . antiseptic oral  rinse  15 mL Mouth Rinse BID  . aspirin EC  81 mg Oral Daily  . atorvastatin  80 mg Oral q1800  . calcium-vitamin D  1 tablet Oral BID WC  . fentaNYL      . folic acid-pyridoxine-cyancobalamin  1 tablet Oral Daily  . insulin aspart  0-9 Units Subcutaneous TID WC  . LORazepam  2 mg Oral QHS  . metoprolol tartrate  25 mg Oral BID  . pantoprazole  80 mg Oral Q1200  . potassium chloride SA  20 mEq Oral BID  . sodium chloride  3 mL Intravenous Q12H  . Ticagrelor  90 mg Oral BID  . DISCONTD: diazepam  5 mg Oral On Call  . DISCONTD: sodium chloride  3 mL Intravenous Q12H    Labs:  [facility] 03/30/12 0410 03/28/12 0535  NA 140 141  K 3.7 4.2  CL 103 102  CO2 30 33*  GLUCOSE 103* 118*  BUN 13 12  CREATININE 0.86 0.95  CALCIUM 9.2 9.9  MG -- --  PHOS -- --    Basename 03/30/12 0410 03/29/12 0625  WBC 5.2 6.1  NEUTROABS -- --  HGB 13.5 14.9  HCT 39.5 42.7  MCV 93.8 92.6  PLT 145* 157    Basename 03/29/12 2113 03/29/12 1433 03/29/12 0755 03/27/12 1103  CKTOTAL 137 111 102 743*  CKMB 5.9* 4.4* 3.6 52.2*  TROPONINI -- -- -- 22.16*    Basename 03/28/12 0536  CHOL 204*  HDL 46  LDLCALC 124*  TRIG 169*  CHOLHDL 4.4    Basename 03/27/12 1103  TSH 1.147  T4TOTAL --  T3FREE --  THYROIDAB --   Radiology/Studies:  Cath this admission   Assessment and Plan   1. Newly diagnosed CAD with NSTEMI this admission, EF 65% - s/p PTCA/DESx1 to prox-mid RCA 03/27/12 urgently in setting of hypotension/bradycardia - s/p staged PTCA/Evolve study stent to mid LAD & PTCA/Evolve study stent to prox LCx 03/29/12 2. HTN 3. OSA 4. Hyperglycemia with A1C 5.7  5. Hyperlipidemia with LDL 124  6. Hypokalemia on admit 7. Papular rash isolated to her back - unclear etiology, resembles miliaria (sweat rash)  Continue ASA, Brilinta minimum of 12 months. Continue BB - BP has been on the lower side overnight so consider decreasing to 12.5mg  bid if remains low (she says it's only 140 now because  she got "worked up and upset" earlier from all the interruptions in her room). Statin is new so will need f/u lipids/LFTs 6 weeks. Hypokalemia was likely related to BB/diuretic combo which has been stopped. Continue KCl once daily at discharge. Will ask CM to come by to ensure pharmacy availability of Brilinta. She is very motivated to work with cardiac rehab as an outpatient. She was encouraged to f/u with PCP for her other medical issues including OSA & hyperglycemia. I instructed her to call if rash does not improve or spreads, and instructed her to seek medical attention immediately if she should develop difficulty breathing, itchy throat, throat swelling. She should use PRN benadryl at home and f/u if not improved.  Of note, patient is on estradiol-norethindrone patch at home - will ask MD if okay to continue at discharge.  Signed, Ronie Spies PA-C  Patient seen, examined. Available data reviewed. Agree with findings, assessment, and plan as outlined by Ronie Spies, PA-C. Pt is stable following staged PCI and ready for discharge. We discussed post-MI instructions, medical therapy, and follow-up. She understands and will be seen in follow-up in 2 weeks.  Tonny Bollman, M.D. 03/30/2012 10:25 AM  \

## 2012-03-30 NOTE — Discharge Summary (Signed)
Discharge Summary   Patient ID: Carrie Chung MRN: [id number], DOB/AGE: 72-08-1941 72 y.o. Admit date: 03/26/2012 D/C date:     03/30/2012   Primary Discharge Diagnoses:  1. Newly diagnosed CAD with NSTEMI this admission, EF 65% - s/p PTCA/DESx1 to prox-mid RCA 03/27/12 urgently in setting of hypotension/bradycardia - s/p staged PTCA/Evolve study stent to mid LAD & PTCA/Evolve study stent to prox LCx 03/29/12 2. HTN 3. OSA 4. Hyperglycemia with A1C 5.7   5. Hyperlipidemia with LDL 124  6. Papular rash isolated to back, possible miliaria (sweat rash) 7. Hypokalemia  Hospital Course: 71 y/o F with hx of HTN, OSA presented to [facility] with complaints of chest pain. She has a hx of normal stress myoview in 2011 done for TWI at that time -she saw Dr. Excell Seltzer last fall and was doing well. On day of admission she developed midsternal CP with radiation to the left side, up her neck and left arm lasting 1-2 hours associated with flushing and nausea. In the ER, EKG demonstrated NSR with normalization of old T wave inversions in the lateral precordial leads. Initial troponin was elevated at 0.09. She was initiated on IV heparin/IV NTG gtt with plans for possible heart cath in AM. However, overnight she developed recurrent CP with diaphoresis, bradycardia with HR in the mid 40's with hypotension and EKG changes with peaked T waves and new lateral TWI. She was then taken emergently to the cath lab demonstrating: Left main: No obstructive disease.  Left Anterior Descending Artery: Large vessel that courses to the apex. The proximal segment has diffuse 30% stenosis. The mid vessel has a 99% stenosis just after a small caliber diagonal branch.  Circumflex Artery: Moderate sized vessel with 95% mid stenosis. There is a moderate sized bifurcating OM branch with mild plaque disease.  Right Coronary Artery: Large, dominant vessel with 99% mid stenosis with hazy filling defect c/w thrombus.  Left  Ventricular Angiogram: LVEF 65%.  She had successful PTCA with DES x 1 to the mid RCA. Post-procedurally she did have recurrence of chest pain that was felt possibly due to her residual significant CAD. She was taken back to the cath lab in a staged fashion on 4/9 and ultimately had Evolve study stents to her mid LAD and prox Cx. She tolerated the procedure well. She was noted this admission to have a papular rash isolated to her back but the patient believes this is a sweat rash due to initial diaphoresis and lying on her back. It does resemble miliaria and she was educated on the warning signs of progressive allergic reaction. Today she is doing well. She ambulated without difficulty with cardiac rehab. Her medications were adjusted this morning - off diuretic, her K was still 3.7 so we will continue KCl daily with plans for a BMET when she is seen back in the office. The patient was seen and examined today and felt stable for discharge by Dr. Excell Seltzer.  Discharge Vitals: Blood pressure 131/92, pulse 73, temperature 98.1 F (36.7 C), temperature source Oral, resp. rate 18, height 5\' 2"  (1.575 m), weight 158 lb 1.1 oz (71.7 kg), SpO2 96.00%.  Labs: Lab Results  Component Value Date   WBC 5.2 03/30/2012   HGB 13.5 03/30/2012   HCT 39.5 03/30/2012   MCV 93.8 03/30/2012   PLT 145* 03/30/2012     Lab 03/30/12 0410 03/26/12 1655  NA 140 --  K 3.7 --  CL 103 --  CO2 30 --  BUN 13 --  CREATININE 0.86 --  CALCIUM 9.2 --  PROT -- 7.0  BILITOT -- 0.3  ALKPHOS -- 60  ALT -- 19  AST -- 22  GLUCOSE 103* --    Basename 03/29/12 2113 03/29/12 1433 03/29/12 0755 03/27/12 1103  CKTOTAL 137 111 102 743*  CKMB 5.9* 4.4* 3.6 52.2*  TROPONINI -- -- -- 22.16*   Lab Results  Component Value Date   CHOL 204* 03/28/2012   HDL 46 03/28/2012   LDLCALC 621* 03/28/2012   TRIG 169* 03/28/2012   Lab Results  Component Value Date   DDIMER 0.46 03/26/2012    Diagnostic Studies/Procedures   Cardiac  catheterization this admission, please see full report and above for summary.  Discharge Medications   Medication List  As of 03/30/2012 10:06 AM   STOP taking these medications         amLODipine 5 MG tablet      aspirin 325 MG tablet      atenolol-chlorthalidone 100-25 MG per tablet      COMBIPATCH 0.05-0.25 MG/DAY         TAKE these medications         acetaminophen 500 MG tablet   Commonly known as: TYLENOL   Take 1,000 mg by mouth every 6 (six) hours as needed. For pain      aspirin 81 MG tablet   Take 1 tablet (81 mg total) by mouth daily.      atorvastatin 80 MG tablet   Commonly known as: LIPITOR   Take 1 tablet (80 mg total) by mouth at bedtime.      CALTRATE 600+D 600-400 MG-UNIT per tablet   Generic drug: Calcium Carbonate-Vitamin D   Take 1 tablet by mouth 2 (two) times daily.      diphenhydrAMINE 25 mg capsule   Commonly known as: BENADRYL   Take 1 capsule (25 mg total) by mouth every 6 (six) hours as needed (for itching or rash).      FOLBIC 2.5-25-2 MG Tabs   Generic drug: folic acid-pyridoxine-cyancobalamin   TAKE 1 TABLET EVERY DAY      LORazepam 2 MG tablet   Commonly known as: ATIVAN   Take 1 tablet (2 mg total) by mouth at bedtime.      metoprolol tartrate 25 MG tablet   Commonly known as: LOPRESSOR   Take 1 tablet (25 mg total) by mouth 2 (two) times daily.      NEXIUM 40 MG capsule   Generic drug: esomeprazole   TAKE ONE CAPSULE BY MOUTH EVERY DAY      nitroGLYCERIN 0.4 MG SL tablet   Commonly known as: NITROSTAT   Place 1 tablet (0.4 mg total) under the tongue every 5 (five) minutes x 3 doses as needed for chest pain.      potassium chloride SA 20 MEQ tablet   Commonly known as: K-DUR,KLOR-CON   Take 1 tablet (20 mEq total) by mouth daily.      Ticagrelor 90 MG Tabs tablet   Commonly known as: BRILINTA   Take 1 tablet (90 mg total) by mouth 2 (two) times daily.            Disposition   The patient will be discharged in  stable condition to home. Discharge Orders    Future Appointments: Provider: Department: Dept Phone: Center:   04/19/2012 8:30 AM Beatrice Lecher, PA [facility] [phone] [facility]   04/19/2012 8:50 AM [facility] [phone]  [facility]   04/28/2012 2:00 PM Margaree Mackintosh, MD [facility] [phone] [facility]   05/20/2012 10:30 AM Tonny Bollman, MD [facility] [phone] [facility]     Future Orders Please Complete By Expires   Diet - low sodium heart healthy      Increase activity slowly      Comments:   No driving for 1 week. No lifting over 5 lbs for 2 weeks. No sexual activity for 2 weeks. Keep procedure site clean & dry. If you notice increased pain, swelling, bleeding or pus, call/return!  You may shower, but no soaking baths/hot tubs/pools for 1 week.       Follow-up Information    Follow up with Margaree Mackintosh, MD. (Follow up with your primary care doctor for your sleep apnea and borderline elevated blood sugar, which will need to be followed periodically. If rash does not resolve, seek medical attention.)       Follow up with Tereso Newcomer, PA. (You will see Tereso Newcomer PA-C at Dr. Earmon Phoenix office for your first after-hospital follow-up visit 04/19/12 at 8:30am. You will also have labwork this day.)    Contact information:   [address] [place] [address] [phone]            Duration of Discharge Encounter: Greater than 30 minutes including physician and PA time.  Signed, Dayna Dunn PA-C 03/30/2012, 10:06 AM  Agree as outlined. Please see my progress note this same date.  Tonny Bollman 03/30/2012 10:27 AM

## 2012-04-04 ENCOUNTER — Telehealth: Payer: Self-pay | Admitting: Cardiovascular Disease

## 2012-04-04 MED ORDER — POTASSIUM CHLORIDE CRYS ER 20 MEQ PO TBCR: 20.0000 meq | EXTENDED_RELEASE_TABLET | Freq: Every day | ORAL | Status: DC

## 2012-04-04 NOTE — Telephone Encounter (Signed)
Patient request return call at [phone]  Patient would like to speak with nurse regarding potassium chloride SA (KLOR-CON M20) 20 MEQ tablet Medication.  She can be reached at hm# [phone].

## 2012-04-04 NOTE — Telephone Encounter (Signed)
Patient called needing refill for potassium 20 meq daily.Refill sent to [facility].

## 2012-04-13 ENCOUNTER — Other Ambulatory Visit: Payer: Self-pay

## 2012-04-13 MED ORDER — LORAZEPAM 2 MG PO TABS: 2.0000 mg | ORAL_TABLET | Freq: Every day | ORAL | Status: DC

## 2012-04-19 ENCOUNTER — Ambulatory Visit (INDEPENDENT_AMBULATORY_CARE_PROVIDER_SITE_OTHER): Payer: Medicare Other | Admitting: Physician Assistant

## 2012-04-19 ENCOUNTER — Telehealth: Payer: Self-pay | Admitting: *Deleted

## 2012-04-19 ENCOUNTER — Other Ambulatory Visit: Payer: Medicare Other

## 2012-04-19 ENCOUNTER — Encounter: Payer: Self-pay | Admitting: Physician Assistant

## 2012-04-19 VITALS — BP 182/98 | HR 60 | Ht 61.5 in | Wt 153.0 lb

## 2012-04-19 DIAGNOSIS — I10 Essential (primary) hypertension: Secondary | ICD-10-CM

## 2012-04-19 DIAGNOSIS — R9431 Abnormal electrocardiogram [ECG] [EKG]: Secondary | ICD-10-CM

## 2012-04-19 DIAGNOSIS — I2510 Atherosclerotic heart disease of native coronary artery without angina pectoris: Secondary | ICD-10-CM | POA: Diagnosis not present

## 2012-04-19 DIAGNOSIS — E785 Hyperlipidemia, unspecified: Secondary | ICD-10-CM | POA: Insufficient documentation

## 2012-04-19 DIAGNOSIS — F419 Anxiety disorder, unspecified: Secondary | ICD-10-CM | POA: Insufficient documentation

## 2012-04-19 DIAGNOSIS — F411 Generalized anxiety disorder: Secondary | ICD-10-CM

## 2012-04-19 LAB — BASIC METABOLIC PANEL
BUN: 14 mg/dL (ref 6–23)
CO2: 27 mEq/L (ref 19–32)
Calcium: 9.4 mg/dL (ref 8.4–10.5)
Chloride: 104 mEq/L (ref 96–112)
Creatinine, Ser: 0.8 mg/dL (ref 0.4–1.2)

## 2012-04-19 MED ORDER — AMLODIPINE BESYLATE 5 MG PO TABS: 5.0000 mg | ORAL_TABLET | Freq: Every day | ORAL | Status: DC

## 2012-04-19 MED ORDER — CLONIDINE HCL 0.1 MG PO TABS
0.1000 mg | ORAL_TABLET | ORAL | Status: AC
  Administered 2012-04-19: 0.1 mg via ORAL

## 2012-04-19 NOTE — Telephone Encounter (Signed)
pt notified of lab results today and gave me 2 BP readings from this afternoon, 150/76, 67 hr and @ 5:15 133/68, 78 hr. Carrie Chung

## 2012-04-19 NOTE — Progress Notes (Signed)
[address] [place], [place]  [address] Phone: [phone] Fax:  [phone]  Date:  04/19/2012   Name:  Carrie Chung       DOB:  May 20, 1941 MRN:  [id number]  PCP:  Margaree Mackintosh, MD, MD  Primary Cardiologist:  Dr. Tonny Bollman  Primary Electrophysiologist:  None    History of Present Illness: Carrie Chung is a 71 y.o. female who returns for post hospital follow up.    She has a h/o HTN and abnormal ECG and chronic DOE evaluated by Dr. Excell Seltzer in the past with ETT-echo in 2011 that was negative.    She was admitted 4/6-4/10 with NSTEMI.  Presented with CP and was initially tx with Heparin and NTG gtt.  Developed recurrent pain with diaphoresis and bradycardia (HR 40s) and hypotension requiring emergent LHC.  LHC 03/27/12: pLAD 30%, mLAD 99%, mCFX 95%, mRCA 99% with hazy filling defect c/w thrombus, EF 65%.  PCI:  Promus DES to [facility].  She was brought back to the cath lab 03/29/12 for intervention of her residual disease in the setting of refractory angina.  PCI 03/29/12:  Evolve Study Stent (DES) to mLAD and Evolve Study Stent (DES) to pCFX.  ASA and Brilinta was recommended for minimum of 12 mos.  Echo 03/28/12: EF 60%, Aortic sclerosis without stenosis, mild RVE, mild reduced RVSF.  K+ was low and she needs BMET today.  Labs: Hgb 13.5, Plt 145, K 3.7, creatinine 0.86, ALT 19, TC 204, HDL 46, LDL 124, TG 169, DDimer 0.46.  She is here with her daughter today.  Patient notes increasing fatigue in the afternoons.  Notes her BPs having steadily been going up at home.  This past week, BPs have ranged 140-150/70-80s.  Walks on treadmill QD.  Now up to 20 mins in AM and 10 mins in PM.  No chest pain c/w prior angina.  Notes occ breathlessness.  Seems to occur at the top of her steps.  Probably describes Class II-IIb symptoms.  No orthopnea, PND, edema.  No syncope.  Feels lightheaded.  Feels nervous.    Past Medical History  Diagnosis Date  . Hypokalemia   . HTN  (hypertension)     essential  . Sleep apnea   . Endometrial polyp   . CAD (coronary artery disease)     NSTEMI 03/2012 s/p PTCA/DESx1 to prox-mid RCA 03/27/12 urgently in setting of hypotension/bradycardia, with staged PTCA/Evolve study stent to mid LAD & PTCA/Evolve study stent to prox LCx 03/29/12 (EF 65%)  . Hyperglycemia     A1C 5.7 03/2012  . Hyperlipidemia     Current Outpatient Prescriptions  Medication Sig Dispense Refill  . acetaminophen (TYLENOL) 500 MG tablet Take 1,000 mg by mouth every 6 (six) hours as needed. For pain      . aspirin 81 MG tablet Take 1 tablet (81 mg total) by mouth daily.      Marland Kitchen atorvastatin (LIPITOR) 80 MG tablet Take 1 tablet (80 mg total) by mouth at bedtime.  30 tablet  11  . Calcium Carbonate-Vitamin D (CALTRATE 600+D) 600-400 MG-UNIT per tablet Take 1 tablet by mouth 2 (two) times daily.       . diphenhydrAMINE (BENADRYL) 25 mg capsule Take 1 capsule (25 mg total) by mouth every 6 (six) hours as needed (for itching or rash).  30 capsule  0  . FOLBIC 2.5-25-2 MG TABS TAKE 1 TABLET EVERY DAY  30 tablet  12  . LORazepam (ATIVAN)  2 MG tablet Take 1 tablet (2 mg total) by mouth at bedtime.  30 tablet  4  . metoprolol tartrate (LOPRESSOR) 25 MG tablet Take 1 tablet (25 mg total) by mouth 2 (two) times daily.  60 tablet  11  . NEXIUM 40 MG capsule TAKE ONE CAPSULE BY MOUTH EVERY DAY  30 capsule  11  . nitroGLYCERIN (NITROSTAT) 0.4 MG SL tablet Place 1 tablet (0.4 mg total) under the tongue every 5 (five) minutes x 3 doses as needed for chest pain.  25 tablet  4  . potassium chloride SA (KLOR-CON M20) 20 MEQ tablet Take 1 tablet (20 mEq total) by mouth daily.  30 tablet  6  . Ticagrelor (BRILINTA) 90 MG TABS tablet Take 1 tablet (90 mg total) by mouth 2 (two) times daily.  60 tablet  11    Allergies: Allergies  Allergen Reactions  . Penicillins Hives and Swelling  . Sulfonamide Derivatives Hives and Swelling  . Tetracycline Hives and Swelling  . Zinc Oxide Rash     History  Substance Use Topics  . Smoking status: Former Games developer  . Smokeless tobacco: Not on file  . Alcohol Use: Yes     socially     ROS:  Please see the history of present illness.    All other systems reviewed and negative.   PHYSICAL EXAM: VS:  BP 182/98  Pulse 60  Ht 5' 1.5" (1.562 m)  Wt 153 lb (69.4 kg)  BMI 28.44 kg/m2 Repeat BP by me: 200/98 bilaterally  Well nourished, well developed, in no acute distress HEENT: normal Neck: no JVD Cardiac:  normal S1, S2; RRR; no murmur Lungs:  clear to auscultation bilaterally, no wheezing, rhonchi or rales Abd: soft, nontender, no hepatomegaly Ext: no edema; right wrist without hematoma or bruit Skin: warm and dry Neuro:  CNs 2-12 intact, no focal abnormalities noted  EKG:  NSR, HR 60, leftward axis, TW inversions 3, aVF, V4-6, no change from prior   ASSESSMENT AND PLAN:  1. Coronary Artery Disease  Doing well post MI.  We discussed the importance of dual antiplatelet therapy.  She starts cardiac rehab soon.  Feels fatigued.  I suspect this is related to her high BP.  See below.  Follow up with Dr. Tonny Bollman in 6 weeks.   2. Essential hypertension, benign  BP uncontrolled.  She was given Clonidine 0.1 mg x 1 in the office.  We watched her for 30 minutes and BP came down to 176/94.  I will restart her Amlodipine 5 mg QD.  Check BP cuff with nurse this week.  Keep an eye on her BP at home.  Follow up with me in 2 weeks to recheck BP.   3. Hyperlipidemia  Continue Lipitor.  Will need to arrange follow up Lipids and LFTs at her appt with me in 2 weeks.   4. Anxiety  She is asking about medication for anxiety.  We discussed that depression and anxiety are common post MI.  She is already on Lorazepam QHS prn.  I have suggested she take this once during the day PRN.  She can follow up with PCP.        Signed, Tereso Newcomer, PA-C  8:30 AM 04/19/2012

## 2012-04-19 NOTE — Patient Instructions (Addendum)
START AMLODIPINE 5 MG 1 TABLET DAILY  APPOINTMENT TO SEE SCOTT WEAVER, PAC 2 WEEKS AND 6 WEEKS WITH DR. Excell Seltzer  BMET TODAY  NURSE VISIT THIS WEEK FOR BLOOD PRESSURE CHECK AND BRING CUFF FROM HOME

## 2012-04-19 NOTE — Telephone Encounter (Signed)
Message copied by Tarri Fuller on Tue Apr 19, 2012  5:27 PM ------      Message from: [place], [place] T      Created: Tue Apr 19, 2012  5:00 PM       Please notify patient that the lab results are ok.      Tereso Newcomer, PA-C  5:00 PM 04/19/2012

## 2012-04-20 HISTORY — PX: CAROTID STENT: SHX1301

## 2012-04-21 ENCOUNTER — Encounter (HOSPITAL_COMMUNITY): Payer: Self-pay

## 2012-04-21 ENCOUNTER — Encounter (HOSPITAL_COMMUNITY)
Admission: RE | Admit: 2012-04-21 | Discharge: 2012-04-21 | Disposition: A | Discharge status: Home / Self Care | Payer: Medicare Other | Source: Ambulatory Visit | Attending: Cardiovascular Disease | Admitting: Cardiovascular Disease

## 2012-04-21 ENCOUNTER — Telehealth: Payer: Self-pay | Admitting: *Deleted

## 2012-04-21 DIAGNOSIS — I2510 Atherosclerotic heart disease of native coronary artery without angina pectoris: Secondary | ICD-10-CM | POA: Insufficient documentation

## 2012-04-21 DIAGNOSIS — R9431 Abnormal electrocardiogram [ECG] [EKG]: Secondary | ICD-10-CM | POA: Insufficient documentation

## 2012-04-21 DIAGNOSIS — Z5189 Encounter for other specified aftercare: Secondary | ICD-10-CM | POA: Insufficient documentation

## 2012-04-21 DIAGNOSIS — I10 Essential (primary) hypertension: Secondary | ICD-10-CM | POA: Insufficient documentation

## 2012-04-21 DIAGNOSIS — Z9861 Coronary angioplasty status: Secondary | ICD-10-CM | POA: Insufficient documentation

## 2012-04-21 DIAGNOSIS — E785 Hyperlipidemia, unspecified: Secondary | ICD-10-CM | POA: Insufficient documentation

## 2012-04-21 DIAGNOSIS — I498 Other specified cardiac arrhythmias: Secondary | ICD-10-CM | POA: Insufficient documentation

## 2012-04-21 DIAGNOSIS — I214 Non-ST elevation (NSTEMI) myocardial infarction: Secondary | ICD-10-CM | POA: Insufficient documentation

## 2012-04-21 NOTE — Telephone Encounter (Signed)
Message copied by Tarri Fuller on Thu Apr 21, 2012  4:56 PM ------      Message from: [place], [place] T      Created: Thu Apr 21, 2012  2:02 PM       Better      Continue current therapy.       Tereso Newcomer, PA-C  2:02 PM 04/21/2012

## 2012-04-21 NOTE — Progress Notes (Signed)
Cardiac Rehab Medication Review by a Pharmacist  Does the patient  feel that his/her medications are working for him/her?  yes  Has the patient been experiencing any side effects to the medications prescribed?  Yes---some lightheadedness, and minor aches---states it doesn't affect ADLs   Does the patient measure his/her own blood pressure or blood glucose at home?  Yes---checks BP at home once-twice daily   Does the patient have any problems obtaining medications due to transportation or finances?   no  Understanding of regimen: good Understanding of indications: good Potential of compliance: good---has two pill boxes at home to assist with adherence     Concha Norway 04/21/2012 9:15 AM

## 2012-04-21 NOTE — Telephone Encounter (Signed)
pt notified of lab rsults today and gave me verbal understanding. she asked about glucose #. I told pt # was 116 and that she needs to f/u w/PCP. she gave me verbal understanding to this today. I have a copy of her labs for her per pt req

## 2012-04-22 ENCOUNTER — Ambulatory Visit (INDEPENDENT_AMBULATORY_CARE_PROVIDER_SITE_OTHER): Payer: Medicare Other | Admitting: *Deleted

## 2012-04-22 ENCOUNTER — Encounter: Payer: Self-pay | Admitting: *Deleted

## 2012-04-22 VITALS — BP 148/72 | HR 68 | Ht 61.0 in | Wt 153.0 lb

## 2012-04-22 DIAGNOSIS — I10 Essential (primary) hypertension: Secondary | ICD-10-CM

## 2012-04-22 NOTE — Progress Notes (Signed)
Discussed with Carrie Chung  Pt's BP cuff from home was calibrated.  We are not going to make any changes to medications today.  Keep follow up as scheduled

## 2012-04-25 ENCOUNTER — Encounter (HOSPITAL_COMMUNITY)
Admission: RE | Admit: 2012-04-25 | Discharge: 2012-04-25 | Disposition: A | Discharge status: Home / Self Care | Payer: Medicare Other | Source: Ambulatory Visit | Attending: Cardiovascular Disease | Admitting: Cardiovascular Disease

## 2012-04-25 DIAGNOSIS — R9431 Abnormal electrocardiogram [ECG] [EKG]: Secondary | ICD-10-CM | POA: Diagnosis not present

## 2012-04-25 DIAGNOSIS — I10 Essential (primary) hypertension: Secondary | ICD-10-CM | POA: Diagnosis not present

## 2012-04-25 DIAGNOSIS — E785 Hyperlipidemia, unspecified: Secondary | ICD-10-CM | POA: Diagnosis not present

## 2012-04-25 DIAGNOSIS — I214 Non-ST elevation (NSTEMI) myocardial infarction: Secondary | ICD-10-CM | POA: Diagnosis not present

## 2012-04-25 DIAGNOSIS — Z5189 Encounter for other specified aftercare: Secondary | ICD-10-CM | POA: Diagnosis not present

## 2012-04-25 DIAGNOSIS — I2510 Atherosclerotic heart disease of native coronary artery without angina pectoris: Secondary | ICD-10-CM | POA: Diagnosis not present

## 2012-04-25 DIAGNOSIS — Z9861 Coronary angioplasty status: Secondary | ICD-10-CM | POA: Diagnosis not present

## 2012-04-25 DIAGNOSIS — I498 Other specified cardiac arrhythmias: Secondary | ICD-10-CM | POA: Diagnosis not present

## 2012-04-25 NOTE — Progress Notes (Signed)
Pt started cardiac rehab today.  Pt tolerated light exercise without difficulty.  Telemetry Sinus with Slight ST depression which is unchanged from previous 12 lead ECG.  Blood pressures ranged from 140's systolic to  150's systolic  Over 60's to 80's diastolic.  Blood pressure easily audible in the right arm.  Blood pressure in the left arm a little hard to hear. No complaints of chest pain. Will continue to monitor the patient throughout  the program.

## 2012-04-27 ENCOUNTER — Encounter (HOSPITAL_COMMUNITY)
Admission: RE | Admit: 2012-04-27 | Discharge: 2012-04-27 | Disposition: A | Discharge status: Home / Self Care | Payer: Medicare Other | Source: Ambulatory Visit | Attending: Cardiovascular Disease | Admitting: Cardiovascular Disease

## 2012-04-27 NOTE — Progress Notes (Addendum)
Carrie Chung had a non sustained run of atrial fibrillation heart rate max 95 today at cardiac rehab.  Patient asymptomatic, vital signs stable. ECG tracing's faxed to Dr Earmon Phoenix office for review. Will continue to monitor the patient throughout  the program. [name] left cardiac rehab without complaints.  Dr Randolm Idol office called and notified said patient is okay to go home.

## 2012-04-28 ENCOUNTER — Ambulatory Visit (INDEPENDENT_AMBULATORY_CARE_PROVIDER_SITE_OTHER): Payer: Medicare Other | Admitting: Internal Medicine

## 2012-04-28 ENCOUNTER — Encounter: Payer: Self-pay | Admitting: Internal Medicine

## 2012-04-28 ENCOUNTER — Telehealth: Payer: Self-pay | Admitting: *Deleted

## 2012-04-28 VITALS — BP 168/78 | HR 84 | Temp 99.1°F | Ht 60.25 in | Wt 152.0 lb

## 2012-04-28 DIAGNOSIS — Z23 Encounter for immunization: Secondary | ICD-10-CM | POA: Diagnosis not present

## 2012-04-28 DIAGNOSIS — I4891 Unspecified atrial fibrillation: Secondary | ICD-10-CM

## 2012-04-28 DIAGNOSIS — F411 Generalized anxiety disorder: Secondary | ICD-10-CM

## 2012-04-28 DIAGNOSIS — G4700 Insomnia, unspecified: Secondary | ICD-10-CM | POA: Diagnosis not present

## 2012-04-28 DIAGNOSIS — I2510 Atherosclerotic heart disease of native coronary artery without angina pectoris: Secondary | ICD-10-CM

## 2012-04-28 DIAGNOSIS — Z0000 Encounter for general adult medical examination without abnormal findings: Secondary | ICD-10-CM

## 2012-04-28 DIAGNOSIS — J069 Acute upper respiratory infection, unspecified: Secondary | ICD-10-CM

## 2012-04-28 DIAGNOSIS — E8881 Metabolic syndrome: Secondary | ICD-10-CM

## 2012-04-28 DIAGNOSIS — R7302 Impaired glucose tolerance (oral): Secondary | ICD-10-CM

## 2012-04-28 DIAGNOSIS — I10 Essential (primary) hypertension: Secondary | ICD-10-CM

## 2012-04-28 DIAGNOSIS — R7309 Other abnormal glucose: Secondary | ICD-10-CM

## 2012-04-28 DIAGNOSIS — E785 Hyperlipidemia, unspecified: Secondary | ICD-10-CM

## 2012-04-28 DIAGNOSIS — F419 Anxiety disorder, unspecified: Secondary | ICD-10-CM

## 2012-04-28 NOTE — Patient Instructions (Addendum)
Start Lexapro 10 mg daily for anxiety. Try Restoril 30 mg at bedtime for sleep. Return in 4 weeks. Tetanus immunization update given today.

## 2012-04-28 NOTE — Telephone Encounter (Signed)
Dr. Dan Humphreys called today and s/w Bing Neighbors. PAC about pt had episode of A-fib yesterday 5/8 while @ Cardiac rehab. I s/w Byrd Hesselbach today @ rehab and she states she sent over strips to [employer] [facility]. RN 5/8 but will resend today for SW to review

## 2012-04-28 NOTE — Progress Notes (Signed)
Carrie Chung 71 y.o. female       Nutrition Screen                                                                    YES  NO Do you live in a nursing home?  X   Do you eat out more than 3 times/week?    X If yes, how many times per week do you eat out?  Do you have food allergies?   X If yes, what are you allergic to?  Have you gained or lost more than 10 lbs without trying?               X If yes, how much weight have you lost and over what time period?  lbs gained or lost over  weeks/month  Do you want to lose weight?    X  If yes, what is a goal weight or amount of weight you would like to lose? 10-15 lb  Do you eat alone most of the time?   X   Do you eat less than 2 meals/day?  X If yes, how many meals do you eat?  Do you drink more than 3 alcohol drinks/day?  X If yes, how many drinks per day?  Are you having trouble with constipation? *  X If yes, what are you doing to help relieve constipation?  Do you have financial difficulties with buying food?*    X   Are you experiencing regular nausea/ vomiting?*     X   Do you have a poor appetite? *                                       X    Do you have trouble chewing/swallowing? *   X    Pt with diagnoses of:  X Stent/ PTCA X Dyslipidemia  / HDL< 40 / LDL>70 / High TG      X %  Body fat >goal / Body Mass Index >25 X HTN / BP >120/80 X MI       Pt Risk Score   6       Diagnosis Risk Score  25       Total Risk Score   31                         High Risk               X Low Risk    HT: 60.75" Ht Readings from Last 1 Encounters:  04/22/12 5\' 1"  (1.549 m)    WT:   153.3 lb (69.7 kg) Wt Readings from Last 3 Encounters:  04/22/12 153 lb (69.4 kg)  04/21/12 153 lb 10.6 oz (69.7 kg)  04/19/12 153 lb (69.4 kg)     IBW 47.2 148%IBW BMI 29.3 37.8%body fat  Meds reviewed: Caltrate with vitamin D, Potassium Chloride Past Medical History  Diagnosis Date  . Hypokalemia   . HTN (hypertension)     essential  . Sleep apnea     . Endometrial polyp   . CAD (coronary artery disease)  NSTEMI 03/2012 s/p PTCA/DESx1 to prox-mid RCA 03/27/12 urgently in setting of hypotension/bradycardia, with staged PTCA/Evolve study stent to mid LAD & PTCA/Evolve study stent to prox LCx 03/29/12 (EF 65%)  . Hyperglycemia     A1C 5.7 03/2012  . Hyperlipidemia        Activity level: Pt is active  Wt goal: 138-143 lb ( 62.7-65 kg) Current tobacco use? No Food/Drug Interaction? No Labs:  Lipid Panel     Component Value Date/Time   CHOL 204* 03/28/2012 0536   TRIG 169* 03/28/2012 0536   HDL 46 03/28/2012 0536   CHOLHDL 4.4 03/28/2012 0536   VLDL 34 03/28/2012 0536   LDLCALC 124* 03/28/2012 0536   Lab Results  Component Value Date   HGBA1C 5.7 03/09/2012   04/19/12 Glucose 116  LDL goal: < 70      MI and > 2:      HTN, >71 yo female Estimated Daily Nutrition Needs for: ? wt loss  1200-1450 Kcal , Total Fat 30-40gm, Saturated Fat 9-11 gm, Trans Fat 1.2-1.5 gm,  Sodium less than 1500 mg

## 2012-04-28 NOTE — Telephone Encounter (Signed)
s/w pt today and she is aware of the dose change of metoprolol to 37.5 bid and event monitor ordered today, cont cardiac rehab per [employer]. PAC

## 2012-04-29 ENCOUNTER — Encounter (HOSPITAL_COMMUNITY): Admission: RE | Admit: 2012-04-29 | Payer: Medicare Other | Source: Ambulatory Visit

## 2012-04-29 ENCOUNTER — Telehealth (HOSPITAL_COMMUNITY): Payer: Self-pay | Admitting: *Deleted

## 2012-05-02 ENCOUNTER — Encounter (HOSPITAL_COMMUNITY)
Admission: RE | Admit: 2012-05-02 | Discharge: 2012-05-02 | Disposition: A | Discharge status: Home / Self Care | Payer: Medicare Other | Source: Ambulatory Visit | Attending: Cardiovascular Disease | Admitting: Cardiovascular Disease

## 2012-05-02 NOTE — Progress Notes (Signed)
Reviewed home exercise with pt today.  Pt plans to walk and use treadmill at home for exercise.  Reviewed THR, pulse, RPE, sign and symptoms, NTG use, and when to call 911 or MD.  Pt voiced understanding. Kohlton Gilpatrick, MA, ACSM RCEP  

## 2012-05-03 ENCOUNTER — Encounter (INDEPENDENT_AMBULATORY_CARE_PROVIDER_SITE_OTHER): Payer: Medicare Other

## 2012-05-03 DIAGNOSIS — I4891 Unspecified atrial fibrillation: Secondary | ICD-10-CM | POA: Diagnosis not present

## 2012-05-04 ENCOUNTER — Encounter: Payer: Self-pay | Admitting: Physician Assistant

## 2012-05-04 ENCOUNTER — Encounter (HOSPITAL_COMMUNITY)
Admission: RE | Admit: 2012-05-04 | Discharge: 2012-05-04 | Disposition: A | Discharge status: Home / Self Care | Payer: Medicare Other | Source: Ambulatory Visit | Attending: Cardiovascular Disease | Admitting: Cardiovascular Disease

## 2012-05-04 ENCOUNTER — Ambulatory Visit (INDEPENDENT_AMBULATORY_CARE_PROVIDER_SITE_OTHER): Payer: Medicare Other | Admitting: Physician Assistant

## 2012-05-04 VITALS — BP 142/78 | HR 78 | Ht 60.25 in | Wt 153.0 lb

## 2012-05-04 DIAGNOSIS — R000 Tachycardia, unspecified: Secondary | ICD-10-CM

## 2012-05-04 DIAGNOSIS — I10 Essential (primary) hypertension: Secondary | ICD-10-CM

## 2012-05-04 DIAGNOSIS — I2510 Atherosclerotic heart disease of native coronary artery without angina pectoris: Secondary | ICD-10-CM

## 2012-05-04 DIAGNOSIS — E785 Hyperlipidemia, unspecified: Secondary | ICD-10-CM

## 2012-05-04 MED ORDER — METOPROLOL TARTRATE 50 MG PO TABS: 50.0000 mg | ORAL_TABLET | Freq: Two times a day (BID) | ORAL | Status: DC

## 2012-05-04 NOTE — Patient Instructions (Addendum)
Your physician recommends that you schedule a follow-up appointment in: 05/26/12 WITH DR. Excell Seltzer  Your physician recommends that you return for lab work in: 05/17/12 FOR FASTING LIPID/LIVER PANEL, CBC W/DIFF, BMET  INCREASE METOPROLOL TARTRATE TO 50 MG 1 TABLET TWICE DAILY ANEW PRESCRIPTION HAS BEEN SENT IN TODAY FOR YOU

## 2012-05-04 NOTE — Progress Notes (Signed)
Carrie Chung 71 y.o. female Nutrition Note Spoke with pt.  Nutrition Plan, Nutrition Survey, and cholesterol goals reviewed with pt. Pt is following Step 2 of the Therapeutic Lifestyle Changes diet. Pt wants to lose wt. Pt states she successfully lost wt (25 lb) and kept it off 2 years ago and only recently "fell off of the wagon." Pt's current wt loss plan includes decreasing portion sizes and increasing fruits and veggies. Pt reports previously "consuming 1200 kcal a day was a big day." Pt cautioned not to go below 1200 kcal/day. Weight loss tips discussed. Pt is pre-diabetic according to pt's A1c. Pt denies being pre-diabetic and states "there's a history there." Pre-diabetes discussed briefly with pt per pt's request. Pt expressed understanding of information provided. Pt c/o decreased appetite. Pt eats despite "not being hungry." ? Decreased appetite may be due to depression given pt informed this Clinical research associate that she "has an anti-depressant prescription that I think I'm going to start tomorrow." Pt states she has a history of not eating until 10 am and then "eating from 3 pm on." Pt is currently eating 3 meals a day and an afternoon snack most days.  Nutrition Diagnosis   Food-and nutrition-related knowledge deficit related to lack of exposure to information as related to diagnosis of: ? CVD ? Pre-DM (A1c 5.7)    Overweight related to excessive energy intake as evidenced by a BMI of 29.3  Nutrition RX/ Estimated Daily Nutrition Needs for: wt loss  1200-1450 Kcal, 30-40 gm fat, 9-11 gm sat fat, 1.2-1.5 gm trans-fat, <1500 mg sodium  Nutrition Intervention   Pt's individual nutrition plan including cholesterol goals reviewed with pt.   Benefits of adopting Therapeutic Lifestyle Changes discussed when Medficts reviewed.   Pt to attend the Portion Distortion class   Pt to attend the  ? Nutrition I class                         ? Nutrition II class    Pt given handouts for: ? wt loss ? pre-DM    Continue client-centered nutrition education by RD, as part of interdisciplinary care. Goal(s)   Pt to identify food quantities necessary to achieve: ? wt loss to a goal wt of 138-143 lb (62.7-65 kg) at graduation from cardiac rehab.  Monitor and Evaluate progress toward nutrition goal with team.

## 2012-05-04 NOTE — Progress Notes (Signed)
[address] [place], [place]  [address] Phone: [phone] Fax:  [phone]  Date:  05/04/2012   Name:  Carrie Chung    DOB:  [birth year]   MRN:  [id number]  PCP:  Margaree Mackintosh, MD, MD  Primary Cardiologist:  Dr. Tonny Bollman  Primary Electrophysiologist:  None    History of Present Illness: Carrie Chung is a 71 y.o. female who returns for follow up on BP.    She has a h/o HTN and abnormal ECG and chronic DOE evaluated by Dr. Excell Seltzer in the past with ETT-echo in 2011 that was negative.  Admitted 03/2011 with NSTEMI.  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  RCA was the culprit and she underwent Promus DES to [facility].  Ultimately had staged PCI with Evolve Study Stent (DES) to mLAD and Evolve Study Stent (DES) to pCFX.  Echo 03/28/12: EF 60%, Aortic sclerosis without AS, mild RVE, mild reduced RVSF.   I saw her in follow up 4/30.  Her BP was quite elevated and I put her back on amlodipine 5 mg daily.  BPs since have looked good (RN visit and at cardiac rehab).  We received one tele strip from cardiac rehab with what appeared to by AFib vs ATach for 6 seconds.  I increased her metoprolol to 37.5 mg bid and put her on an event monitor.  She needs ASA + Brilinta for a minimum of 12 mos.  If she has frequent AFib, she would need anticoagulation (CHADS2-VASc = 3).  Of note, PCP put her on Lexapro for anxiety and Restoril for sleep recently.    Doing well.  Recently tx for bronchitis.  Slowly recovering.  Off antibiotics now.  No chest pain.  No syncope, orthopnea, PND, edema.  Still notes DOE with more extreme activities.  Not getting worse.  Did not notice before heart attack.    Past Medical History  Diagnosis Date  . Hypokalemia   . HTN (hypertension)     essential  . Sleep apnea   . Endometrial polyp   . CAD (coronary artery disease)     NSTEMI  03/2012 (LHC 03/27/12: pLAD 30%, mLAD 99%, mCFX 95%, mRCA 99% with thrombus, EF 65%);  s/p PTCA/DESx1 to prox-mid RCA 03/27/12 urgently in setting of hypotension/bradycardia, with staged PTCA/Evolve study stent to mid LAD & PTCA/Evolve study stent to prox LCx 03/29/12 ;   echo 03/28/12:EF 60%, Aortic sclerosis without AS, mild RVE, mild reduced RVSF    . Hyperglycemia     A1C 5.7 03/2012  . Hyperlipidemia   . Atrial tachycardia     noted at cardiac rehab 5/13; event monitor ordered to assess for AFib    Current Outpatient Prescriptions  Medication Sig Dispense Refill  . acetaminophen (TYLENOL) 500 MG tablet Take 1,000 mg by mouth every 6 (six) hours as needed. For pain      . amLODipine (NORVASC) 5 MG tablet Take 1 tablet (5 mg total) by mouth daily.  30 tablet  11  . aspirin 81 MG tablet Take 1 tablet (81 mg total) by mouth daily.      Marland Kitchen atorvastatin (LIPITOR) 80 MG tablet Take 1 tablet (80 mg total) by mouth at bedtime.  30 tablet  11  . Calcium Carbonate-Vitamin D (CALTRATE 600+D) 600-400 MG-UNIT per tablet Take 1 tablet by mouth 2 (two) times daily.       Marland Kitchen  FOLBIC 2.5-25-2 MG TABS TAKE 1 TABLET EVERY DAY  30 tablet  12  . LORazepam (ATIVAN) 2 MG tablet Take 1 tablet (2 mg total) by mouth at bedtime.  30 tablet  4  . metoprolol tartrate (LOPRESSOR) 25 MG tablet Take 1 and 1/2 (half) tablets twice daily      . NEXIUM 40 MG capsule TAKE ONE CAPSULE BY MOUTH EVERY DAY  30 capsule  11  . nitroGLYCERIN (NITROSTAT) 0.4 MG SL tablet Place 1 tablet (0.4 mg total) under the tongue every 5 (five) minutes x 3 doses as needed for chest pain.  25 tablet  4  . potassium chloride SA (KLOR-CON M20) 20 MEQ tablet Take 1 tablet (20 mEq total) by mouth daily.  30 tablet  6  . Ticagrelor (BRILINTA) 90 MG TABS tablet Take 1 tablet (90 mg total) by mouth 2 (two) times daily.  60 tablet  11    Allergies: Allergies  Allergen Reactions  . Penicillins Hives and Swelling  . Sulfonamide Derivatives Hives and Swelling  .  Tetracycline Hives and Swelling  . Zinc Oxide Rash    History  Substance Use Topics  . Smoking status: Former Smoker    Types: Cigarettes    Quit date: 04/19/1980  . Smokeless tobacco: Never Used  . Alcohol Use: Yes     socially     ROS:  Please see the history of present illness.  No melena, hematochezia, hematuria, hematemesis.  Did have epistaxis x 1.  All other systems reviewed and negative.   PHYSICAL EXAM: VS:  BP 142/78  Pulse 78  Ht 5' 0.25" (1.53 m)  Wt 153 lb (69.4 kg)  BMI 29.63 kg/m2 Repeat BP by me: 140/76  Well nourished, well developed, in no acute distress HEENT: normal Neck: no JVD Cardiac:  normal S1, S2; RRR; no murmur Lungs:  clear to auscultation bilaterally, no wheezing, rhonchi or rales Abd: soft, nontender, no hepatomegaly Ext: no edema  Skin: warm and dry Neuro:  CNs 2-12 intact, no focal abnormalities noted  ASSESSMENT AND PLAN:  1. Coronary Artery Disease  No angina.  Continue ASA and Brilinta.  Continue with cardiac rehab.  Follow up with Dr. Tonny Bollman in June.  Check CBC with Lipids in 2 weeks.    2. Essential hypertension, benign  BP better controlled.  Adjust metoprolol to 50 mg bid.     3. Hyperlipidemia  Continue Lipitor.  Arrange FLP and LFTs in next 2 weeks.    4. Anxiety  Meds adjusted with PCP recently.     5. Tachycardia Continue event monitor.  Adjust metoprolol to 50 mg bid.      Signed, Tereso Newcomer, PA-C  1:53 PM 05/04/2012

## 2012-05-06 ENCOUNTER — Encounter (HOSPITAL_COMMUNITY)
Admission: RE | Admit: 2012-05-06 | Discharge: 2012-05-06 | Disposition: A | Discharge status: Home / Self Care | Payer: Medicare Other | Source: Ambulatory Visit | Attending: Cardiovascular Disease | Admitting: Cardiovascular Disease

## 2012-05-06 NOTE — Progress Notes (Addendum)
Carrie Chung reported having a twinge of chest discomfort on the treadmill after workload increased. Exercise stopped blood pressure 132/60.  Carrie Chung said the discomfort went away after rest exercise stopped.  Telemetry rhythm sinus 82.  ECG tracing faxed to Dr Earmon Phoenix office for review. Dr Clifton James reviewed ECG tracing. No further treatment is warranted. Dr Clifton James said Carrie Chung can resume exercise at cardiac rehab.  Carrie Chung started taking Lexapro this morning prescribed by Dr Lenord Fellers and plans to take Restoril tonight to help with sleep. Carrie Chung left cardiac rehab without complaints or chest pain. Will continue to monitor the patient throughout  the program.

## 2012-05-09 ENCOUNTER — Encounter (HOSPITAL_COMMUNITY)
Admission: RE | Admit: 2012-05-09 | Discharge: 2012-05-09 | Disposition: A | Discharge status: Home / Self Care | Payer: Medicare Other | Source: Ambulatory Visit | Attending: Cardiovascular Disease | Admitting: Cardiovascular Disease

## 2012-05-11 ENCOUNTER — Encounter (HOSPITAL_COMMUNITY)
Admission: RE | Admit: 2012-05-11 | Discharge: 2012-05-11 | Disposition: A | Discharge status: Home / Self Care | Payer: Medicare Other | Source: Ambulatory Visit | Attending: Cardiovascular Disease | Admitting: Cardiovascular Disease

## 2012-05-13 ENCOUNTER — Encounter (HOSPITAL_COMMUNITY)
Admission: RE | Admit: 2012-05-13 | Discharge: 2012-05-13 | Disposition: A | Discharge status: Home / Self Care | Payer: Medicare Other | Source: Ambulatory Visit | Attending: Cardiovascular Disease | Admitting: Cardiovascular Disease

## 2012-05-14 ENCOUNTER — Other Ambulatory Visit: Payer: Self-pay | Admitting: Internal Medicine

## 2012-05-17 ENCOUNTER — Other Ambulatory Visit (INDEPENDENT_AMBULATORY_CARE_PROVIDER_SITE_OTHER): Payer: Medicare Other

## 2012-05-17 DIAGNOSIS — I10 Essential (primary) hypertension: Secondary | ICD-10-CM | POA: Diagnosis not present

## 2012-05-17 DIAGNOSIS — I2510 Atherosclerotic heart disease of native coronary artery without angina pectoris: Secondary | ICD-10-CM

## 2012-05-17 DIAGNOSIS — E785 Hyperlipidemia, unspecified: Secondary | ICD-10-CM

## 2012-05-17 DIAGNOSIS — R000 Tachycardia, unspecified: Secondary | ICD-10-CM | POA: Diagnosis not present

## 2012-05-17 LAB — HEPATIC FUNCTION PANEL
ALT: 25 U/L (ref 0–35)
AST: 21 U/L (ref 0–37)
Alkaline Phosphatase: 59 U/L (ref 39–117)
Bilirubin, Direct: 0 mg/dL (ref 0.0–0.3)
Total Bilirubin: 0.7 mg/dL (ref 0.3–1.2)
Total Protein: 6.8 g/dL (ref 6.0–8.3)

## 2012-05-17 LAB — BASIC METABOLIC PANEL
BUN: 14 mg/dL (ref 6–23)
Calcium: 9.4 mg/dL (ref 8.4–10.5)
Creatinine, Ser: 0.9 mg/dL (ref 0.4–1.2)
GFR: 69 mL/min (ref >60.00)
Glucose, Bld: 106 mg/dL — ABNORMAL HIGH (ref 70–99)
Potassium: 3.9 mEq/L (ref 3.5–5.1)

## 2012-05-17 LAB — LIPID PANEL
Cholesterol: 113 mg/dL (ref 0–200)
VLDL: 23.2 mg/dL (ref 0.0–40.0)

## 2012-05-18 ENCOUNTER — Encounter (HOSPITAL_COMMUNITY)
Admission: RE | Admit: 2012-05-18 | Discharge: 2012-05-18 | Disposition: A | Discharge status: Home / Self Care | Payer: Medicare Other | Source: Ambulatory Visit | Attending: Cardiovascular Disease | Admitting: Cardiovascular Disease

## 2012-05-20 ENCOUNTER — Encounter: Payer: Medicare Other | Admitting: Cardiovascular Disease

## 2012-05-20 ENCOUNTER — Encounter (HOSPITAL_COMMUNITY)
Admission: RE | Admit: 2012-05-20 | Discharge: 2012-05-20 | Disposition: A | Discharge status: Home / Self Care | Payer: Medicare Other | Source: Ambulatory Visit | Attending: Cardiovascular Disease | Admitting: Cardiovascular Disease

## 2012-05-20 ENCOUNTER — Telehealth: Payer: Self-pay | Admitting: *Deleted

## 2012-05-20 DIAGNOSIS — E785 Hyperlipidemia, unspecified: Secondary | ICD-10-CM

## 2012-05-20 DIAGNOSIS — I10 Essential (primary) hypertension: Secondary | ICD-10-CM

## 2012-05-20 NOTE — Telephone Encounter (Signed)
Message copied by Tarri Fuller on Fri May 20, 2012 11:21 AM ------      Message from: [place], [place] T      Created: Thu May 19, 2012  1:53 PM       Please notify patient that the lab results are ok.      CBC pending.      Tereso Newcomer, PA-C  1:53 PM 05/19/2012

## 2012-05-20 NOTE — Telephone Encounter (Signed)
lmom ptcb, pt needs to come in and get repeat cbc due to not enough blood in tube per Madlyn Frankel in lab, other labs ok

## 2012-05-20 NOTE — Telephone Encounter (Signed)
Fu call °Patient returning your call °

## 2012-05-23 ENCOUNTER — Encounter (HOSPITAL_COMMUNITY)
Admission: RE | Admit: 2012-05-23 | Discharge: 2012-05-23 | Disposition: A | Discharge status: Home / Self Care | Payer: Medicare Other | Source: Ambulatory Visit | Attending: Cardiovascular Disease | Admitting: Cardiovascular Disease

## 2012-05-23 ENCOUNTER — Other Ambulatory Visit (INDEPENDENT_AMBULATORY_CARE_PROVIDER_SITE_OTHER): Payer: Medicare Other

## 2012-05-23 DIAGNOSIS — I10 Essential (primary) hypertension: Secondary | ICD-10-CM | POA: Diagnosis not present

## 2012-05-23 DIAGNOSIS — I498 Other specified cardiac arrhythmias: Secondary | ICD-10-CM | POA: Diagnosis not present

## 2012-05-23 DIAGNOSIS — E785 Hyperlipidemia, unspecified: Secondary | ICD-10-CM | POA: Diagnosis not present

## 2012-05-23 DIAGNOSIS — R9431 Abnormal electrocardiogram [ECG] [EKG]: Secondary | ICD-10-CM | POA: Insufficient documentation

## 2012-05-23 DIAGNOSIS — I214 Non-ST elevation (NSTEMI) myocardial infarction: Secondary | ICD-10-CM | POA: Diagnosis not present

## 2012-05-23 DIAGNOSIS — I2510 Atherosclerotic heart disease of native coronary artery without angina pectoris: Secondary | ICD-10-CM | POA: Diagnosis not present

## 2012-05-23 DIAGNOSIS — Z9861 Coronary angioplasty status: Secondary | ICD-10-CM | POA: Insufficient documentation

## 2012-05-23 DIAGNOSIS — Z5189 Encounter for other specified aftercare: Secondary | ICD-10-CM | POA: Diagnosis not present

## 2012-05-23 LAB — CBC WITH DIFFERENTIAL/PLATELET
Basophils Absolute: 0 10*3/uL (ref 0.0–0.1)
Basophils Relative: 0.3 % (ref 0.0–3.0)
Eosinophils Absolute: 0.1 10*3/uL (ref 0.0–0.7)
Lymphocytes Relative: 27.7 % (ref 12.0–46.0)
MCHC: 33.5 g/dL (ref 30.0–36.0)
MCV: 95.6 fl (ref 78.0–100.0)
Monocytes Absolute: 0.3 10*3/uL (ref 0.1–1.0)
Neutrophils Relative %: 63.8 % (ref 43.0–77.0)
RBC: 4.15 Mil/uL (ref 3.87–5.11)
RDW: 13 % (ref 11.5–14.6)

## 2012-05-25 ENCOUNTER — Encounter (HOSPITAL_COMMUNITY)
Admission: RE | Admit: 2012-05-25 | Discharge: 2012-05-25 | Disposition: A | Discharge status: Home / Self Care | Payer: Medicare Other | Source: Ambulatory Visit | Attending: Cardiovascular Disease | Admitting: Cardiovascular Disease

## 2012-05-25 NOTE — Progress Notes (Signed)
Pt informed rehab staff that she has an upcoming appointment with Dr. Excell Seltzer on Thursday June 6th.  Rehab report scanned to media manager and in basket message sent to Dr. Excell Seltzer and Julieta Gutting.

## 2012-05-26 ENCOUNTER — Ambulatory Visit (INDEPENDENT_AMBULATORY_CARE_PROVIDER_SITE_OTHER): Payer: Medicare Other | Admitting: Cardiovascular Disease

## 2012-05-26 ENCOUNTER — Encounter: Payer: Self-pay | Admitting: Cardiovascular Disease

## 2012-05-26 VITALS — BP 158/84 | HR 64 | Ht 61.5 in | Wt 151.0 lb

## 2012-05-26 DIAGNOSIS — E7800 Pure hypercholesterolemia, unspecified: Secondary | ICD-10-CM

## 2012-05-26 DIAGNOSIS — I200 Unstable angina: Secondary | ICD-10-CM

## 2012-05-26 DIAGNOSIS — I2510 Atherosclerotic heart disease of native coronary artery without angina pectoris: Secondary | ICD-10-CM

## 2012-05-26 NOTE — Progress Notes (Signed)
HPI:  71 year old woman presenting for followup evaluation. The patient has multivessel coronary disease and presented in April 2012 with a non-ST elevation infarction. She underwent emergency stenting of the right coronary artery. She then underwent staged PCI of the LAD and left circumflex vessels. Drug-eluting stent platform for utilized. Her left ventricular function has been normal with an ejection fraction of 60%. She's been participating in cardiac rehabilitation. There have been worse with atrial dysrhythmias noted in an event recorder was arranged. This is been completed and I reviewed it today. It demonstrates normal sinus rhythm throughout. I do not appreciate any significant arrhythmias.  The patient has no palpitations. She denies chest pain or pressure. She feels generally fatigued. She has been noticing more hot flashes since she has stopped supplemental estrogen. She denies edema, orthopnea, or PND. She's been compliant with her medications and overall feels fairly well. I have reviewed her blood pressure readings from cardiac rehabilitation and her blood pressure control has been excellent with readings over the past week ranging from 102-132 over 64-80.  Outpatient Encounter Prescriptions as of 05/26/2012  Medication Sig Dispense Refill  . acetaminophen (TYLENOL) 500 MG tablet Take 1,000 mg by mouth every 6 (six) hours as needed. For pain      . amLODipine (NORVASC) 5 MG tablet Take 1 tablet (5 mg total) by mouth daily.  30 tablet  11  . aspirin 81 MG tablet Take 1 tablet (81 mg total) by mouth daily.      Marland Kitchen atorvastatin (LIPITOR) 80 MG tablet Take 1 tablet (80 mg total) by mouth at bedtime.  30 tablet  11  . Calcium Carbonate-Vitamin D (CALTRATE 600+D) 600-400 MG-UNIT per tablet Take 1 tablet by mouth 2 (two) times daily.       Marland Kitchen escitalopram (LEXAPRO) 10 MG tablet Take 10 mg by mouth daily.      . FOLBIC 2.5-25-2 MG TABS TAKE 1 TABLET EVERY DAY  30 tablet  12  . LORazepam (ATIVAN)  2 MG tablet Take 1 tablet (2 mg total) by mouth at bedtime.  30 tablet  4  . metoprolol (LOPRESSOR) 50 MG tablet Take 1 tablet (50 mg total) by mouth 2 (two) times daily.  60 tablet  11  . NEXIUM 40 MG capsule TAKE ONE CAPSULE BY MOUTH EVERY DAY  30 capsule  11  . nitroGLYCERIN (NITROSTAT) 0.4 MG SL tablet Place 1 tablet (0.4 mg total) under the tongue every 5 (five) minutes x 3 doses as needed for chest pain.  25 tablet  4  . potassium chloride SA (KLOR-CON M20) 20 MEQ tablet Take 1 tablet (20 mEq total) by mouth daily.  30 tablet  6  . temazepam (RESTORIL) 30 MG capsule Take 30 mg by mouth at bedtime as needed.      . Ticagrelor (BRILINTA) 90 MG TABS tablet Take 1 tablet (90 mg total) by mouth 2 (two) times daily.  60 tablet  11    Allergies  Allergen Reactions  . Penicillins Hives and Swelling  . Sulfonamide Derivatives Hives and Swelling  . Tetracycline Hives and Swelling  . Zinc Oxide Rash    Past Medical History  Diagnosis Date  . Hypokalemia   . HTN (hypertension)     essential  . Sleep apnea   . Endometrial polyp   . CAD (coronary artery disease)     NSTEMI 03/2012 (LHC 03/27/12: pLAD 30%, mLAD 99%, mCFX 95%, mRCA 99% with thrombus, EF 65%);  s/p PTCA/DESx1 to prox-mid RCA  03/27/12 urgently in setting of hypotension/bradycardia, with staged PTCA/Evolve study stent to mid LAD & PTCA/Evolve study stent to prox LCx 03/29/12 ;   echo 03/28/12:EF 60%, Aortic sclerosis without AS, mild RVE, mild reduced RVSF    . Hyperglycemia     A1C 5.7 03/2012  . Hyperlipidemia   . Atrial tachycardia     noted at cardiac rehab 5/13; event monitor ordered to assess for AFib    ROS: Negative except as per HPI  BP 158/84  Pulse 64  Ht 5' 1.5" (1.562 m)  Wt 68.493 kg (151 lb)  BMI 28.07 kg/m2  PHYSICAL EXAM: Pt is alert and oriented, NAD HEENT: normal Neck: JVP - normal, carotids 2+= without bruits Lungs: CTA bilaterally CV: RRR without murmur or gallop Abd: soft, NT, Positive BS, no  hepatomegaly Ext: no C/C/E, distal pulses intact and equal Skin: warm/dry no rash  Is that recorder: Normal sinus rhythm throughout.  ASSESSMENT AND PLAN: 1. CAD status post non-ST elevation MI with multivessel coronary disease. The patient has been treated with multivessel stenting. She is stable on appropriate medical program. Will continue her current medications.  2. Hypertension. Office blood pressure is elevated today but her readings at cardiac rehabilitation the been excellent. We'll continue same therapy.  3. Hyperlipidemia. The patient is on high-dose Lipitor. Her lipids are excellent with a total cholesterol of 113, HDL 53, and LDL 37. These are markedly improved from her baseline readings.  4. Followup: I will plan on seeing the patient back in 4 months. She will have repeat lipids and LFTs at that time. May consider reduction in her Lipitor dose after review of her followup labs. Also we'll revisit out disabling her hot flashes are and whether supplemental estrogen is absolutely contraindicated as she gets further out from her acute coronary syndrome.  Tonny Bollman 05/26/2012 5:52 PM

## 2012-05-26 NOTE — Patient Instructions (Signed)
Your physician wants you to follow-up in: 4 MONTHS. You will receive a reminder letter in the mail two months in advance. If you don't receive a letter, please call our office to schedule the follow-up appointment.  Your physician recommends that you continue on your current medications as directed. Please refer to the Current Medication list given to you today.  Your physician recommends that you return for a FASTING LIPID and LIVER profile in 4 MONTHS--nothing to eat or drink after midnight, lab opens at 8:30

## 2012-05-27 ENCOUNTER — Encounter (HOSPITAL_COMMUNITY)
Admission: RE | Admit: 2012-05-27 | Discharge: 2012-05-27 | Disposition: A | Discharge status: Home / Self Care | Payer: Medicare Other | Source: Ambulatory Visit | Attending: Cardiovascular Disease | Admitting: Cardiovascular Disease

## 2012-05-27 NOTE — Progress Notes (Addendum)
Makinzie's blood pressures were moderately elevated today.  The patient said she took her medications as prescribed.  Maximum blood pressure 160/90 on the airdyne.  Exit blood pressure 144/70.  Diane's blood pressure was somewhat elevated at Dr Randolm Idol office yestersday. . Will send exercise flow sheets for review via media manager for Dr Excell Seltzer to review. Ernest voiced no complaints today at exercise.

## 2012-05-30 ENCOUNTER — Encounter (HOSPITAL_COMMUNITY)
Admission: RE | Admit: 2012-05-30 | Discharge: 2012-05-30 | Disposition: A | Discharge status: Home / Self Care | Payer: Medicare Other | Source: Ambulatory Visit | Attending: Cardiovascular Disease | Admitting: Cardiovascular Disease

## 2012-05-31 ENCOUNTER — Ambulatory Visit (INDEPENDENT_AMBULATORY_CARE_PROVIDER_SITE_OTHER): Payer: Medicare Other | Admitting: Internal Medicine

## 2012-05-31 ENCOUNTER — Encounter: Payer: Self-pay | Admitting: Internal Medicine

## 2012-05-31 DIAGNOSIS — F3289 Other specified depressive episodes: Secondary | ICD-10-CM

## 2012-05-31 DIAGNOSIS — I10 Essential (primary) hypertension: Secondary | ICD-10-CM | POA: Diagnosis not present

## 2012-05-31 DIAGNOSIS — E785 Hyperlipidemia, unspecified: Secondary | ICD-10-CM | POA: Diagnosis not present

## 2012-05-31 DIAGNOSIS — R232 Flushing: Secondary | ICD-10-CM

## 2012-05-31 DIAGNOSIS — I2510 Atherosclerotic heart disease of native coronary artery without angina pectoris: Secondary | ICD-10-CM | POA: Diagnosis not present

## 2012-05-31 DIAGNOSIS — N951 Menopausal and female climacteric states: Secondary | ICD-10-CM

## 2012-05-31 DIAGNOSIS — F32A Depression, unspecified: Secondary | ICD-10-CM

## 2012-05-31 DIAGNOSIS — F329 Major depressive disorder, single episode, unspecified: Secondary | ICD-10-CM

## 2012-05-31 NOTE — Patient Instructions (Signed)
Increase Lexapro to 20 mg daily. Return in 4 weeks.

## 2012-05-31 NOTE — Progress Notes (Signed)
  Subjective:    Patient ID: Carrie Chung, female    DOB: [birth year], 71 y.o.   MRN: [id number]  HPI patient in today to followup on depression. Emotional lability has improved on Lexapro 10 mg daily but she still has some good days and bad days. Her daughter told me confidentially recently she thought her mood had improved. Patient still going to cardiac rehabilitation status post MI. EOMI frightened her quite a bit she's had to change her lifestyle. Has some issues dealing with her sister who is difficult in [place]. Mother is 46 years old. She was going to visit her mother in [place] every 10 weeks or so but has had to cut back on that since her health problems have arisen. Having some hot flashes off hormone replacement therapy. Has appointment see cardiologist in September.    Review of Systems     Objective:   Physical Exam chest clear to auscultation; cardiac exam regular rate and rhythm        Assessment & Plan:  Coronary artery disease  Hypertension  Hyperlipidemia  Depression  Plan: Patient will increase Lexapro to 20 mg daily. We'll see her again in 4 weeks.

## 2012-06-01 ENCOUNTER — Encounter (HOSPITAL_COMMUNITY)
Admission: RE | Admit: 2012-06-01 | Discharge: 2012-06-01 | Disposition: A | Discharge status: Home / Self Care | Payer: Medicare Other | Source: Ambulatory Visit | Attending: Cardiovascular Disease | Admitting: Cardiovascular Disease

## 2012-06-02 MED ORDER — ESCITALOPRAM OXALATE 10 MG PO TABS: 10.0000 mg | ORAL_TABLET | Freq: Every day | ORAL | Status: DC

## 2012-06-02 NOTE — Progress Notes (Signed)
Dr Lenord Fellers increased Sol's lexapro to 20 mg a day.  Quality of life scores faxed to Dr Earmon Phoenix and Dr Beryle Quant office for review.

## 2012-06-02 NOTE — Addendum Note (Signed)
Addended by: Judy Pimple on: 06/02/2012 04:17 PM   Modules accepted: Orders

## 2012-06-03 ENCOUNTER — Encounter (HOSPITAL_COMMUNITY)
Admission: RE | Admit: 2012-06-03 | Discharge: 2012-06-03 | Disposition: A | Discharge status: Home / Self Care | Payer: Medicare Other | Source: Ambulatory Visit | Attending: Cardiovascular Disease | Admitting: Cardiovascular Disease

## 2012-06-04 ENCOUNTER — Other Ambulatory Visit: Payer: Self-pay | Admitting: Internal Medicine

## 2012-06-06 ENCOUNTER — Encounter (HOSPITAL_COMMUNITY)
Admission: RE | Admit: 2012-06-06 | Discharge: 2012-06-06 | Disposition: A | Discharge status: Home / Self Care | Payer: Medicare Other | Source: Ambulatory Visit | Attending: Cardiovascular Disease | Admitting: Cardiovascular Disease

## 2012-06-06 NOTE — Progress Notes (Signed)
Pt noted that since she has increased Lexapro her mental outlook has improved.  Things that bothered her before no longer have the same effect and she can deal better.  Pt pleased with the effects.

## 2012-06-08 ENCOUNTER — Encounter (HOSPITAL_COMMUNITY)
Admission: RE | Admit: 2012-06-08 | Discharge: 2012-06-08 | Disposition: A | Discharge status: Home / Self Care | Payer: Medicare Other | Source: Ambulatory Visit | Attending: Cardiovascular Disease | Admitting: Cardiovascular Disease

## 2012-06-10 ENCOUNTER — Encounter (HOSPITAL_COMMUNITY)
Admission: RE | Admit: 2012-06-10 | Discharge: 2012-06-10 | Disposition: A | Discharge status: Home / Self Care | Payer: Medicare Other | Source: Ambulatory Visit | Attending: Cardiovascular Disease | Admitting: Cardiovascular Disease

## 2012-06-13 ENCOUNTER — Encounter (HOSPITAL_COMMUNITY)
Admission: RE | Admit: 2012-06-13 | Discharge: 2012-06-13 | Disposition: A | Discharge status: Home / Self Care | Payer: Medicare Other | Source: Ambulatory Visit | Attending: Cardiovascular Disease | Admitting: Cardiovascular Disease

## 2012-06-15 ENCOUNTER — Encounter (HOSPITAL_COMMUNITY)
Admission: RE | Admit: 2012-06-15 | Discharge: 2012-06-15 | Disposition: A | Discharge status: Home / Self Care | Payer: Medicare Other | Source: Ambulatory Visit | Attending: Cardiovascular Disease | Admitting: Cardiovascular Disease

## 2012-06-15 DIAGNOSIS — Z1231 Encounter for screening mammogram for malignant neoplasm of breast: Secondary | ICD-10-CM | POA: Diagnosis not present

## 2012-06-17 ENCOUNTER — Encounter (HOSPITAL_COMMUNITY)
Admission: RE | Admit: 2012-06-17 | Discharge: 2012-06-17 | Disposition: A | Discharge status: Home / Self Care | Payer: Medicare Other | Source: Ambulatory Visit | Attending: Cardiovascular Disease | Admitting: Cardiovascular Disease

## 2012-06-20 ENCOUNTER — Encounter (HOSPITAL_COMMUNITY)
Admission: RE | Admit: 2012-06-20 | Discharge: 2012-06-20 | Disposition: A | Discharge status: Home / Self Care | Payer: Medicare Other | Source: Ambulatory Visit | Attending: Cardiovascular Disease | Admitting: Cardiovascular Disease

## 2012-06-20 DIAGNOSIS — I10 Essential (primary) hypertension: Secondary | ICD-10-CM | POA: Diagnosis not present

## 2012-06-20 DIAGNOSIS — R9431 Abnormal electrocardiogram [ECG] [EKG]: Secondary | ICD-10-CM | POA: Insufficient documentation

## 2012-06-20 DIAGNOSIS — E785 Hyperlipidemia, unspecified: Secondary | ICD-10-CM | POA: Diagnosis not present

## 2012-06-20 DIAGNOSIS — I214 Non-ST elevation (NSTEMI) myocardial infarction: Secondary | ICD-10-CM | POA: Diagnosis not present

## 2012-06-20 DIAGNOSIS — I498 Other specified cardiac arrhythmias: Secondary | ICD-10-CM | POA: Insufficient documentation

## 2012-06-20 DIAGNOSIS — I2510 Atherosclerotic heart disease of native coronary artery without angina pectoris: Secondary | ICD-10-CM | POA: Diagnosis not present

## 2012-06-20 DIAGNOSIS — Z9861 Coronary angioplasty status: Secondary | ICD-10-CM | POA: Diagnosis not present

## 2012-06-20 DIAGNOSIS — Z5189 Encounter for other specified aftercare: Secondary | ICD-10-CM | POA: Insufficient documentation

## 2012-06-22 ENCOUNTER — Encounter (HOSPITAL_COMMUNITY)
Admission: RE | Admit: 2012-06-22 | Discharge: 2012-06-22 | Disposition: A | Discharge status: Home / Self Care | Payer: Medicare Other | Source: Ambulatory Visit | Attending: Cardiovascular Disease | Admitting: Cardiovascular Disease

## 2012-06-24 ENCOUNTER — Encounter (HOSPITAL_COMMUNITY)
Admission: RE | Admit: 2012-06-24 | Discharge: 2012-06-24 | Disposition: A | Discharge status: Home / Self Care | Payer: Medicare Other | Source: Ambulatory Visit | Attending: Cardiovascular Disease | Admitting: Cardiovascular Disease

## 2012-06-24 ENCOUNTER — Encounter (HOSPITAL_COMMUNITY): Payer: Medicare Other

## 2012-06-27 ENCOUNTER — Encounter (HOSPITAL_COMMUNITY)
Admission: RE | Admit: 2012-06-27 | Discharge: 2012-06-27 | Disposition: A | Discharge status: Home / Self Care | Payer: Medicare Other | Source: Ambulatory Visit | Attending: Cardiovascular Disease | Admitting: Cardiovascular Disease

## 2012-06-29 ENCOUNTER — Encounter (HOSPITAL_COMMUNITY)
Admission: RE | Admit: 2012-06-29 | Discharge: 2012-06-29 | Disposition: A | Discharge status: Home / Self Care | Payer: Medicare Other | Source: Ambulatory Visit | Attending: Cardiovascular Disease | Admitting: Cardiovascular Disease

## 2012-07-01 ENCOUNTER — Encounter (HOSPITAL_COMMUNITY)
Admission: RE | Admit: 2012-07-01 | Discharge: 2012-07-01 | Disposition: A | Discharge status: Home / Self Care | Payer: Medicare Other | Source: Ambulatory Visit | Attending: Cardiovascular Disease | Admitting: Cardiovascular Disease

## 2012-07-04 ENCOUNTER — Encounter (HOSPITAL_COMMUNITY)
Admission: RE | Admit: 2012-07-04 | Discharge: 2012-07-04 | Disposition: A | Discharge status: Home / Self Care | Payer: Medicare Other | Source: Ambulatory Visit | Attending: Cardiovascular Disease | Admitting: Cardiovascular Disease

## 2012-07-06 ENCOUNTER — Encounter (HOSPITAL_COMMUNITY)
Admission: RE | Admit: 2012-07-06 | Discharge: 2012-07-06 | Disposition: A | Discharge status: Home / Self Care | Payer: Medicare Other | Source: Ambulatory Visit | Attending: Cardiovascular Disease | Admitting: Cardiovascular Disease

## 2012-07-06 NOTE — Progress Notes (Signed)
Carrie Chung complained of the lower back discomfort today. Carrie Chung walked the track and used the treadmill today instead of sitting exercises without complaints.  Carrie Chung has a follow up appointment with Dr Lenord Fellers tomorrow. Will fax exercise flow sheets for review.

## 2012-07-07 ENCOUNTER — Encounter: Payer: Self-pay | Admitting: Internal Medicine

## 2012-07-07 ENCOUNTER — Ambulatory Visit (INDEPENDENT_AMBULATORY_CARE_PROVIDER_SITE_OTHER): Payer: Medicare Other | Admitting: Internal Medicine

## 2012-07-07 VITALS — BP 162/82 | HR 64 | Temp 98.9°F | Wt 149.0 lb

## 2012-07-07 DIAGNOSIS — I10 Essential (primary) hypertension: Secondary | ICD-10-CM | POA: Diagnosis not present

## 2012-07-07 DIAGNOSIS — F329 Major depressive disorder, single episode, unspecified: Secondary | ICD-10-CM

## 2012-07-07 DIAGNOSIS — F3289 Other specified depressive episodes: Secondary | ICD-10-CM

## 2012-07-07 DIAGNOSIS — M545 Low back pain, unspecified: Secondary | ICD-10-CM

## 2012-07-07 DIAGNOSIS — I2510 Atherosclerotic heart disease of native coronary artery without angina pectoris: Secondary | ICD-10-CM

## 2012-07-07 DIAGNOSIS — G4700 Insomnia, unspecified: Secondary | ICD-10-CM

## 2012-07-07 DIAGNOSIS — F32A Depression, unspecified: Secondary | ICD-10-CM

## 2012-07-07 LAB — BASIC METABOLIC PANEL
BUN: 13 mg/dL (ref 6–23)
Creat: 0.82 mg/dL (ref 0.50–1.10)
Potassium: 4 mEq/L (ref 3.5–5.3)

## 2012-07-07 NOTE — Patient Instructions (Addendum)
For now continue same medications. We will check serum potassium via she further as to whether or not continue potassium supplement. Return November 2013. Take Vicodin sparingly for back pain. Watch blood pressure.

## 2012-07-07 NOTE — Progress Notes (Signed)
  Subjective:    Patient ID: Carrie Chung, female    DOB: Oct 11, 1941, 71 y.o.   MRN: [id number]  HPI in today to followup on depression. Blood pressure is elevated today. She thinks is elevated because she's having some mid to right lower back pain. Says she's had back pain off and on for years. Has been exercising vigorously in the cardiac rehabilitation class. May have overdone it. Dose of Lexapro seems to be okay for her. Seems to be feeling better and less anxious and worried. Long-standing history of insomnia. Asking about her potassium level. We will check that today. She is on potassium supplement but not on a diuretic. Blood pressure was rechecked in the office and was 150/80. Told her pain could elevate her blood pressure. Is also very hot outside today. Says blood pressures generally been running 120 systolically or less.    Review of Systems     Objective:   Physical Exam  seems slightly anxious today. Can't get comfortable sitting because of back pain. Straight leg raising is positive on the right at 90. Negative on the left at 90. Muscle strength is normal in the lower extremities. Deep tendon reflexes 2+ and symmetrical. Muscle strength is 5 over 5 in the lower extremities. Chest clear to auscultation. Cardiac exam regular rate and rhythm. Extremities without edema. Alert and oriented. Affect appropriate.           Assessment & Plan:  Hypertension  Coronary artery disease  Depression  Low back pain  Insomnia  Plan: Vicodin 5/500 #61 by mouth every 6 hours when necessary back pain. Continue Lexapro. Continue to monitor blood pressure. Serum potassium checked today on potassium supplement but not on diuretic. Will advise further what to do with potassium supplement once we receive these results. Return in November 2013. Continue cardiac rehabilitation.

## 2012-07-08 ENCOUNTER — Encounter (HOSPITAL_COMMUNITY)
Admission: RE | Admit: 2012-07-08 | Discharge: 2012-07-08 | Disposition: A | Discharge status: Home / Self Care | Payer: Medicare Other | Source: Ambulatory Visit | Attending: Cardiovascular Disease | Admitting: Cardiovascular Disease

## 2012-07-11 ENCOUNTER — Encounter (HOSPITAL_COMMUNITY)
Admission: RE | Admit: 2012-07-11 | Discharge: 2012-07-11 | Disposition: A | Discharge status: Home / Self Care | Payer: Medicare Other | Source: Ambulatory Visit | Attending: Cardiovascular Disease | Admitting: Cardiovascular Disease

## 2012-07-13 ENCOUNTER — Encounter (HOSPITAL_COMMUNITY)
Admission: RE | Admit: 2012-07-13 | Discharge: 2012-07-13 | Disposition: A | Discharge status: Home / Self Care | Payer: Medicare Other | Source: Ambulatory Visit | Attending: Cardiovascular Disease | Admitting: Cardiovascular Disease

## 2012-07-15 ENCOUNTER — Encounter (HOSPITAL_COMMUNITY)
Admission: RE | Admit: 2012-07-15 | Discharge: 2012-07-15 | Disposition: A | Discharge status: Home / Self Care | Payer: Medicare Other | Source: Ambulatory Visit | Attending: Cardiovascular Disease | Admitting: Cardiovascular Disease

## 2012-07-18 ENCOUNTER — Encounter (HOSPITAL_COMMUNITY)
Admission: RE | Admit: 2012-07-18 | Discharge: 2012-07-18 | Disposition: A | Discharge status: Home / Self Care | Payer: Medicare Other | Source: Ambulatory Visit | Attending: Cardiovascular Disease | Admitting: Cardiovascular Disease

## 2012-07-19 DIAGNOSIS — Z124 Encounter for screening for malignant neoplasm of cervix: Secondary | ICD-10-CM | POA: Diagnosis not present

## 2012-07-19 DIAGNOSIS — Z01419 Encounter for gynecological examination (general) (routine) without abnormal findings: Secondary | ICD-10-CM | POA: Diagnosis not present

## 2012-07-20 ENCOUNTER — Encounter (HOSPITAL_COMMUNITY)
Admission: RE | Admit: 2012-07-20 | Discharge: 2012-07-20 | Disposition: A | Discharge status: Home / Self Care | Payer: Medicare Other | Source: Ambulatory Visit | Attending: Cardiovascular Disease | Admitting: Cardiovascular Disease

## 2012-07-22 ENCOUNTER — Encounter (HOSPITAL_COMMUNITY)
Admission: RE | Admit: 2012-07-22 | Discharge: 2012-07-22 | Disposition: A | Discharge status: Home / Self Care | Payer: Self-pay | Source: Ambulatory Visit | Attending: Cardiovascular Disease | Admitting: Cardiovascular Disease

## 2012-07-22 ENCOUNTER — Encounter (HOSPITAL_COMMUNITY): Payer: Medicare Other

## 2012-07-22 DIAGNOSIS — I2510 Atherosclerotic heart disease of native coronary artery without angina pectoris: Secondary | ICD-10-CM | POA: Insufficient documentation

## 2012-07-22 DIAGNOSIS — Z9861 Coronary angioplasty status: Secondary | ICD-10-CM | POA: Insufficient documentation

## 2012-07-22 DIAGNOSIS — I498 Other specified cardiac arrhythmias: Secondary | ICD-10-CM | POA: Insufficient documentation

## 2012-07-22 DIAGNOSIS — I214 Non-ST elevation (NSTEMI) myocardial infarction: Secondary | ICD-10-CM | POA: Insufficient documentation

## 2012-07-22 DIAGNOSIS — R9431 Abnormal electrocardiogram [ECG] [EKG]: Secondary | ICD-10-CM | POA: Insufficient documentation

## 2012-07-22 DIAGNOSIS — Z5189 Encounter for other specified aftercare: Secondary | ICD-10-CM | POA: Insufficient documentation

## 2012-07-22 DIAGNOSIS — I10 Essential (primary) hypertension: Secondary | ICD-10-CM | POA: Insufficient documentation

## 2012-07-22 DIAGNOSIS — E785 Hyperlipidemia, unspecified: Secondary | ICD-10-CM | POA: Insufficient documentation

## 2012-07-25 ENCOUNTER — Encounter (HOSPITAL_COMMUNITY): Payer: Medicare Other

## 2012-07-25 ENCOUNTER — Encounter (HOSPITAL_COMMUNITY)
Admission: RE | Admit: 2012-07-25 | Discharge: 2012-07-25 | Disposition: A | Discharge status: Home / Self Care | Payer: Self-pay | Source: Ambulatory Visit | Attending: Cardiovascular Disease | Admitting: Cardiovascular Disease

## 2012-07-26 ENCOUNTER — Telehealth: Payer: Self-pay

## 2012-07-26 NOTE — Telephone Encounter (Signed)
I think this is fine to try.

## 2012-07-26 NOTE — Telephone Encounter (Signed)
Saw Dr. Hyacinth Meeker recently, stating hot flashes were terrible, keping her awake at night. She gets anxious, associating them with cardiac symptoms. Dr. Hyacinth Meeker gave her a scrip for Celexa 40mg  daily, which is an increase in the 20 mg dose she is on now. Wants your input before filling Rx

## 2012-07-27 ENCOUNTER — Encounter (HOSPITAL_COMMUNITY)
Admission: RE | Admit: 2012-07-27 | Discharge: 2012-07-27 | Disposition: A | Discharge status: Home / Self Care | Payer: Self-pay | Source: Ambulatory Visit | Attending: Cardiovascular Disease | Admitting: Cardiovascular Disease

## 2012-07-27 ENCOUNTER — Encounter (HOSPITAL_COMMUNITY): Payer: Medicare Other

## 2012-07-27 NOTE — Telephone Encounter (Signed)
Patient informed. 

## 2012-07-29 ENCOUNTER — Encounter (HOSPITAL_COMMUNITY): Payer: Medicare Other

## 2012-07-29 ENCOUNTER — Encounter (HOSPITAL_COMMUNITY)
Admission: RE | Admit: 2012-07-29 | Discharge: 2012-07-29 | Disposition: A | Discharge status: Home / Self Care | Payer: Self-pay | Source: Ambulatory Visit | Attending: Cardiovascular Disease | Admitting: Cardiovascular Disease

## 2012-08-01 ENCOUNTER — Encounter (HOSPITAL_COMMUNITY)
Admission: RE | Admit: 2012-08-01 | Discharge: 2012-08-01 | Disposition: A | Discharge status: Home / Self Care | Payer: Self-pay | Source: Ambulatory Visit | Attending: Cardiovascular Disease | Admitting: Cardiovascular Disease

## 2012-08-03 ENCOUNTER — Encounter (HOSPITAL_COMMUNITY)
Admission: RE | Admit: 2012-08-03 | Discharge: 2012-08-03 | Disposition: A | Discharge status: Home / Self Care | Payer: Self-pay | Source: Ambulatory Visit | Attending: Cardiovascular Disease | Admitting: Cardiovascular Disease

## 2012-08-04 DIAGNOSIS — H2510 Age-related nuclear cataract, unspecified eye: Secondary | ICD-10-CM | POA: Diagnosis not present

## 2012-08-04 DIAGNOSIS — H52209 Unspecified astigmatism, unspecified eye: Secondary | ICD-10-CM | POA: Diagnosis not present

## 2012-08-05 ENCOUNTER — Telehealth: Payer: Self-pay

## 2012-08-05 ENCOUNTER — Encounter (HOSPITAL_COMMUNITY)
Admission: RE | Admit: 2012-08-05 | Discharge: 2012-08-05 | Disposition: A | Discharge status: Home / Self Care | Payer: Self-pay | Source: Ambulatory Visit | Attending: Cardiovascular Disease | Admitting: Cardiovascular Disease

## 2012-08-05 NOTE — Telephone Encounter (Signed)
Patient has decided to stay on Escitalopram 20 mg as prescribed by Dr. Lenord Fellers, instead of Celexa 40mg  given to her per Dr. Hyacinth Meeker

## 2012-08-08 ENCOUNTER — Encounter (HOSPITAL_COMMUNITY)
Admission: RE | Admit: 2012-08-08 | Discharge: 2012-08-08 | Disposition: A | Discharge status: Home / Self Care | Payer: Self-pay | Source: Ambulatory Visit | Attending: Cardiovascular Disease | Admitting: Cardiovascular Disease

## 2012-08-10 ENCOUNTER — Encounter (HOSPITAL_COMMUNITY)
Admission: RE | Admit: 2012-08-10 | Discharge: 2012-08-10 | Disposition: A | Discharge status: Home / Self Care | Payer: Self-pay | Source: Ambulatory Visit | Attending: Cardiovascular Disease | Admitting: Cardiovascular Disease

## 2012-08-12 ENCOUNTER — Encounter (HOSPITAL_COMMUNITY)
Admission: RE | Admit: 2012-08-12 | Discharge: 2012-08-12 | Disposition: A | Discharge status: Home / Self Care | Payer: Self-pay | Source: Ambulatory Visit | Attending: Cardiovascular Disease | Admitting: Cardiovascular Disease

## 2012-08-15 ENCOUNTER — Encounter (HOSPITAL_COMMUNITY)
Admission: RE | Admit: 2012-08-15 | Discharge: 2012-08-15 | Disposition: A | Discharge status: Home / Self Care | Payer: Self-pay | Source: Ambulatory Visit | Attending: Cardiovascular Disease | Admitting: Cardiovascular Disease

## 2012-08-17 ENCOUNTER — Encounter (HOSPITAL_COMMUNITY)
Admission: RE | Admit: 2012-08-17 | Discharge: 2012-08-17 | Disposition: A | Discharge status: Home / Self Care | Payer: Self-pay | Source: Ambulatory Visit | Attending: Cardiovascular Disease | Admitting: Cardiovascular Disease

## 2012-08-18 ENCOUNTER — Other Ambulatory Visit: Payer: Self-pay

## 2012-08-18 MED ORDER — TEMAZEPAM 30 MG PO CAPS: 30.0000 mg | ORAL_CAPSULE | Freq: Every evening | ORAL | Status: DC | PRN

## 2012-08-19 ENCOUNTER — Encounter (HOSPITAL_COMMUNITY)
Admission: RE | Admit: 2012-08-19 | Discharge: 2012-08-19 | Disposition: A | Discharge status: Home / Self Care | Payer: Self-pay | Source: Ambulatory Visit | Attending: Cardiovascular Disease | Admitting: Cardiovascular Disease

## 2012-08-19 ENCOUNTER — Other Ambulatory Visit: Payer: Self-pay

## 2012-08-19 MED ORDER — ESCITALOPRAM OXALATE 20 MG PO TABS: 20.0000 mg | ORAL_TABLET | Freq: Every day | ORAL | Status: DC

## 2012-08-22 NOTE — Progress Notes (Signed)
Subjective:    Patient ID: Carrie Chung, female    DOB: [birth year], 71 y.o.   MRN: [id number]  HPI 71 year old white female who had a non-ST elevation MI in April of this year. While being hospitalized she had bradycardia and ST elevation was noted in the in. Leads. She was taken to the Cath Lab on an urgent basis and was found to hold 99% blockage in the right coronary artery with thrombus. A drug-eluting stent was placed. 2 days later she was taken back to the Cath Lab because of 2 high-grade blockages, one in the LAD and one in the left circumflex vessels also requiring PCI and drug-eluting stenting. She has done remarkably well and is in cardiac rehabilitation. However all of this was very frightening to the patient and she has become very anxious and is not sleeping. She has a long-standing history of insomnia. Her husband recently had a stroke as well and this has been concerning to her. 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 She was traveling there every couple of months over the past couple of years. She has a long-standing history of hypertension since 2000. History of sleep apnea. 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   She had a normal stress echo in August 2011. She had colonoscopy by Dr. Arlyce Dice 07/22/2004. She had Pneumovax immunization 03/28/2012. Tdap given today.  She is allergic to penicillin, tetracycline,  has had adverse reactions to Rozerem , Ambien and trazodone. Last mammogram May 2012.  Past history includes tonsillectomy and adenoidectomy, appendectomy, removal of Morton's neuroma. Had herniated discs L3,4 and 5 in 1994. From time to time gets low back pain. Tear in right supraspinatus muscle September 2003. C-section 1984. Cholecystectomy 1979. Right foot sesamoid fracture at age 38. Right knee meniscal tear 1975. Fractured pelvis 2001.  Family history: Mother living in her 76s with cardiovascular  disease. Father died at 15 of cancer of the colon. 2 sisters in good health. One adult daughter who is an Pensions consultant and in good health.  Social history: She is married to a physician. She quit smoking over 20 years ago. Social alcohol consumption.      Review of Systems  Constitutional: Positive for fatigue.  HENT: Negative.   Eyes: Negative.   Respiratory: Negative.   Cardiovascular: Negative.   Gastrointestinal: Negative.   Genitourinary: Negative.   Psychiatric/Behavioral: Positive for dysphoric mood.       Insomnia       Objective:   Physical Exam  Vitals reviewed. Constitutional: She is oriented to person, place, and time. She appears well-developed and well-nourished. No distress.  HENT:  Head: Normocephalic and atraumatic.  Right Ear: External ear normal.  Left Ear: External ear normal.  Mouth/Throat: Oropharynx is clear and moist. No oropharyngeal exudate.  Eyes: Conjunctivae and EOM are normal. Pupils are equal, round, and reactive to light. Right eye exhibits no discharge. Left eye exhibits no discharge. No scleral icterus.  Neck: Normal range of motion. Neck supple. No JVD present. No thyromegaly present.  Cardiovascular: Normal rate, regular rhythm, normal heart sounds and intact distal pulses.   No murmur heard. Pulmonary/Chest: Effort normal and breath sounds normal. She has no wheezes. She has no rales.       Breasts normal female  Abdominal: Soft. Bowel sounds are normal. She exhibits no distension and no mass. There is no tenderness. There is no rebound and no guarding.  No organomegaly  Genitourinary:       Deferred to Dr. Leda Quail GYN  Musculoskeletal: She exhibits no edema.  Lymphadenopathy:    She has no cervical adenopathy.  Neurological: She is alert and oriented to person, place, and time. She has normal reflexes. No cranial nerve deficit. Coordination normal.  Skin: Skin is warm and dry. She is not diaphoretic.  Psychiatric: Her behavior  is normal. Judgment and thought content normal.       Depressed affect.   Subjective:   Patient presents for Medicare Annual/Subsequent preventive examination.   Review Past Medical/Family/Social: See above   Risk Factors  Current exercise habits:  Dietary issues discussed:   Cardiac risk factors: Family history, hypertension, hyperlipidemia  Depression Screen  (Note: if answer to either of the following is "Yes", a more complete depression screening is indicated)   Over the past two weeks, have you felt down, depressed or hopeless? Sonny Masters but not hopeless Over the past two weeks, have you felt little interest or pleasure in doing things? No Have you lost interest or pleasure in daily life? No Do you often feel hopeless? No Do you cry easily over simple problems? No   Activities of Daily Living  In your present state of health, do you have any difficulty performing the following activities?:   Driving? No  Managing money? No  Feeding yourself? No  Getting from bed to chair? No  Climbing a flight of stairs? No  Preparing food and eating?: No  Bathing or showering? No  Getting dressed: No  Getting to the toilet? No  Using the toilet:No  Moving around from place to place: No  In the past year have you fallen or had a near fall?:No  Are you sexually active? No  Do you have more than one partner? No   Hearing Difficulties: No  Do you often ask people to speak up or repeat themselves? No  Do you experience ringing or noises in your ears? No  Do you have difficulty understanding soft or whispered voices? No  Do you feel that you have a problem with memory? No Do you often misplace items? No    Home Safety:  Do you have a smoke alarm at your residence? Yes Do you have grab bars in the bathroom? No Do you have throw rugs in your house? Yes   Cognitive Testing  Alert? Yes Normal Appearance?Yes  Oriented to person? Yes Place? Yes  Time? Yes  Recall of three objects?  Yes  Can perform simple calculations? Yes  Displays appropriate judgment?Yes  Can read the correct time from a watch face?Yes   List the Names of Other Physician/Practitioners you currently use:  See referral list for the physicians patient is currently seeing. Dr. Tonny Bollman, cardiology, Dr. Melvia Heaps, gastroenterologist, Dr. Leda Quail, gynecologist    Review of Systems: See above   Objective:     General appearance: Appears stated age and mildly obese  Head: Normocephalic, without obvious abnormality, atraumatic  Eyes: conj clear, EOMi PEERLA  Ears: normal TM's and external ear canals both ears  Nose: Nares normal. Septum midline. Mucosa normal. No drainage or sinus tenderness.  Throat: lips, mucosa, and tongue normal; teeth and gums normal  Neck: no adenopathy, no carotid bruit, no JVD, supple, symmetrical, trachea midline and thyroid not enlarged, symmetric, no tenderness/mass/nodules  No CVA tenderness.  Lungs: clear to auscultation bilaterally  Breasts: normal appearance, no masses or tenderness,  Heart: regular rate and rhythm, S1,  S2 normal, no murmur, click, rub or gallop  Abdomen: soft, non-tender; bowel sounds normal; no masses, no organomegaly  Musculoskeletal: ROM normal in all joints, no crepitus, no deformity, Normal muscle strengthen. Back  is symmetric, no curvature. Skin: Skin color, texture, turgor normal. No rashes or lesions  Lymph nodes: Cervical, supraclavicular, and axillary nodes normal.  Neurologic: CN 2 -12 Normal, Normal symmetric reflexes. Normal coordination and gait  Psych: Alert & Oriented x 3, Mood appear stable. I do think she is a bit depressed and somewhat anxious.   Assessment:    Annual wellness medicare exam   Plan:    During the course of the visit the patient was educated and counseled about appropriate screening and preventive services including:   Annual mammogram     Patient Instructions (the written plan) was  given to the patient.  Medicare Attestation  I have personally reviewed:  The patient's medical and social history  Their use of alcohol, tobacco or illicit drugs  Their current medications and supplements  The patient's functional ability including ADLs,fall risks, home safety risks, cognitive, and hearing and visual impairment  Diet and physical activities  Evidence for depression or mood disorders  The patient's weight, height, BMI, and visual acuity have been recorded in the chart. I have made referrals, counseling, and provided education to the patient based on review of the above and I have provided the patient with a written personalized care plan for preventive services.            Assessment & Plan:  Three-vessel coronary artery disease status post acute MI while hospitalized for non-ST elevation MI requiring emergent PTCA and drug eluding stent placement right coronary artery and subsequent to stance and left circumflex and LAD 2 days later. 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 Followup in 4 weeks for emotional support and to see how she is doing medically. She will continue be followed closely by Dr. Tonny Bollman, cardiologist and continuing in cardiac rehabilitation at [facility] which she is  finding enjoyable. Addendum: She's had a recent URI. Have prescribed Zithromax Z-Pak take 2 tablets by mouth day one followed by 1 tablet by mouth days 2 through 5. Also, she will start Lexapro 10 mg daily for anxiety and depression

## 2012-08-24 ENCOUNTER — Encounter (HOSPITAL_COMMUNITY)
Admission: RE | Admit: 2012-08-24 | Discharge: 2012-08-24 | Disposition: A | Discharge status: Home / Self Care | Payer: Self-pay | Source: Ambulatory Visit | Attending: Cardiovascular Disease | Admitting: Cardiovascular Disease

## 2012-08-24 DIAGNOSIS — R9431 Abnormal electrocardiogram [ECG] [EKG]: Secondary | ICD-10-CM | POA: Insufficient documentation

## 2012-08-24 DIAGNOSIS — Z5189 Encounter for other specified aftercare: Secondary | ICD-10-CM | POA: Insufficient documentation

## 2012-08-24 DIAGNOSIS — I2510 Atherosclerotic heart disease of native coronary artery without angina pectoris: Secondary | ICD-10-CM | POA: Insufficient documentation

## 2012-08-24 DIAGNOSIS — I10 Essential (primary) hypertension: Secondary | ICD-10-CM | POA: Insufficient documentation

## 2012-08-24 DIAGNOSIS — E785 Hyperlipidemia, unspecified: Secondary | ICD-10-CM | POA: Insufficient documentation

## 2012-08-24 DIAGNOSIS — Z9861 Coronary angioplasty status: Secondary | ICD-10-CM | POA: Insufficient documentation

## 2012-08-24 DIAGNOSIS — I498 Other specified cardiac arrhythmias: Secondary | ICD-10-CM | POA: Insufficient documentation

## 2012-08-24 DIAGNOSIS — I214 Non-ST elevation (NSTEMI) myocardial infarction: Secondary | ICD-10-CM | POA: Insufficient documentation

## 2012-08-25 ENCOUNTER — Telehealth: Payer: Self-pay

## 2012-08-25 NOTE — Telephone Encounter (Signed)
Patient given generic Citalopram instead of generic Lexapro. [facility] pharmacy confirmed the mistake and will rectify it for her

## 2012-08-26 ENCOUNTER — Encounter (HOSPITAL_COMMUNITY)
Admission: RE | Admit: 2012-08-26 | Discharge: 2012-08-26 | Disposition: A | Discharge status: Home / Self Care | Payer: Self-pay | Source: Ambulatory Visit | Attending: Cardiovascular Disease | Admitting: Cardiovascular Disease

## 2012-08-29 ENCOUNTER — Encounter (HOSPITAL_COMMUNITY)
Admission: RE | Admit: 2012-08-29 | Discharge: 2012-08-29 | Disposition: A | Discharge status: Home / Self Care | Payer: Self-pay | Source: Ambulatory Visit | Attending: Cardiovascular Disease | Admitting: Cardiovascular Disease

## 2012-08-31 ENCOUNTER — Encounter (HOSPITAL_COMMUNITY)
Admission: RE | Admit: 2012-08-31 | Discharge: 2012-08-31 | Disposition: A | Discharge status: Home / Self Care | Payer: Self-pay | Source: Ambulatory Visit | Attending: Cardiovascular Disease | Admitting: Cardiovascular Disease

## 2012-09-02 ENCOUNTER — Encounter (HOSPITAL_COMMUNITY)
Admission: RE | Admit: 2012-09-02 | Discharge: 2012-09-02 | Disposition: A | Discharge status: Home / Self Care | Payer: Self-pay | Source: Ambulatory Visit | Attending: Cardiovascular Disease | Admitting: Cardiovascular Disease

## 2012-09-05 ENCOUNTER — Encounter (HOSPITAL_COMMUNITY)
Admission: RE | Admit: 2012-09-05 | Discharge: 2012-09-05 | Disposition: A | Discharge status: Home / Self Care | Payer: Self-pay | Source: Ambulatory Visit | Attending: Cardiovascular Disease | Admitting: Cardiovascular Disease

## 2012-09-07 ENCOUNTER — Encounter (HOSPITAL_COMMUNITY)
Admission: RE | Admit: 2012-09-07 | Discharge: 2012-09-07 | Disposition: A | Discharge status: Home / Self Care | Payer: Self-pay | Source: Ambulatory Visit | Attending: Cardiovascular Disease | Admitting: Cardiovascular Disease

## 2012-09-09 ENCOUNTER — Encounter (HOSPITAL_COMMUNITY)
Admission: RE | Admit: 2012-09-09 | Discharge: 2012-09-09 | Disposition: A | Discharge status: Home / Self Care | Payer: Self-pay | Source: Ambulatory Visit | Attending: Cardiovascular Disease | Admitting: Cardiovascular Disease

## 2012-09-12 ENCOUNTER — Encounter (HOSPITAL_COMMUNITY)
Admission: RE | Admit: 2012-09-12 | Discharge: 2012-09-12 | Disposition: A | Discharge status: Home / Self Care | Payer: Self-pay | Source: Ambulatory Visit | Attending: Cardiovascular Disease | Admitting: Cardiovascular Disease

## 2012-09-14 ENCOUNTER — Encounter (HOSPITAL_COMMUNITY)
Admission: RE | Admit: 2012-09-14 | Discharge: 2012-09-14 | Disposition: A | Discharge status: Home / Self Care | Payer: Self-pay | Source: Ambulatory Visit | Attending: Cardiovascular Disease | Admitting: Cardiovascular Disease

## 2012-09-15 ENCOUNTER — Ambulatory Visit (INDEPENDENT_AMBULATORY_CARE_PROVIDER_SITE_OTHER): Payer: Medicare Other | Admitting: Surgery

## 2012-09-15 ENCOUNTER — Encounter: Payer: Self-pay | Admitting: Internal Medicine

## 2012-09-15 ENCOUNTER — Ambulatory Visit (INDEPENDENT_AMBULATORY_CARE_PROVIDER_SITE_OTHER): Payer: Medicare Other | Admitting: Internal Medicine

## 2012-09-15 ENCOUNTER — Encounter (INDEPENDENT_AMBULATORY_CARE_PROVIDER_SITE_OTHER): Payer: Self-pay | Admitting: Surgery

## 2012-09-15 VITALS — BP 142/80 | HR 76 | Temp 100.1°F | Ht 61.0 in | Wt 149.5 lb

## 2012-09-15 VITALS — BP 144/80 | HR 100 | Temp 100.6°F | Resp 20 | Ht 61.0 in | Wt 149.0 lb

## 2012-09-15 DIAGNOSIS — L0231 Cutaneous abscess of buttock: Secondary | ICD-10-CM | POA: Diagnosis not present

## 2012-09-15 DIAGNOSIS — L0233 Carbuncle of buttock: Secondary | ICD-10-CM

## 2012-09-15 DIAGNOSIS — L03317 Cellulitis of buttock: Secondary | ICD-10-CM | POA: Diagnosis not present

## 2012-09-15 MED ORDER — FLUCONAZOLE 150 MG PO TABS: 150.0000 mg | ORAL_TABLET | Freq: Once | ORAL | Status: DC

## 2012-09-15 NOTE — Addendum Note (Signed)
Addended by: Judy Pimple on: 09/15/2012 05:16 PM   Modules accepted: Orders

## 2012-09-15 NOTE — Patient Instructions (Addendum)
Go to [facility] for evaluation per Dr. Jamey Ripa. You have been given Rxs for Doxycycline and Vicodin.

## 2012-09-15 NOTE — Progress Notes (Signed)
  Subjective:    Patient ID: Carrie Chung, female    DOB: Dec 16, 1941, 71 y.o.   MRN: [id number]  HPI Pt. in today with large draining carbuncle right buttock. She took Cipro for about a week when she first noticed it but it did not completely resolve. A few weeks ago, had similar lesion in right axilla which did resolve. Was in hospital in May with MI and had stent placements. Goes to Cardiac Rehab. No prior history of MRSA. Not immunocompromised.    Review of Systems     Objective:   Physical Exam Indurated large carbuncle with central opening that is draining some bloody fluid. Tender to touch. Has erythema surrounding central opening        Assessment & Plan:  Carbuncle right buttock- suspect MRSA  Send to surgeon for I and D Vicodin 5/500 (#60) one po q 6-8 hours prn pain Doxycycline 100 mg bid x 10 days

## 2012-09-15 NOTE — Patient Instructions (Signed)
Begin sitz baths tomorrow MRSA Overview MRSA stands for methicillin-resistant Staphylococcus aureus. It is a type of bacteria that is resistant to some common antibiotics. It can cause infections in the skin and many other places in the body. Staphylococcus aureus, often called "staph," is a bacteria that normally lives on the skin or in the nose. Staph on the surface of the skin or in the nose does not cause problems. However, if the staph enters the body through a cut, wound, or break in the skin, an infection can happen. Up until recently, infections with the MRSA type of staph mainly occurred in hospitals and other healthcare settings. There are now increasing problems with MRSA infections in the community as well. Infections with MRSA may be very serious or even life-threatening. Most MRSA infections are acquired in one of two ways:  Healthcare-associated MRSA (HA-MRSA)   This can be acquired by people in any healthcare setting. MRSA can be a big problem for hospitalized people, people in nursing homes, people in rehabilitation facilities, people with weakened immune systems, dialysis patients, and those who have had surgery.   Community-associated MRSA (CA-MRSA)   Community spread of MRSA is becoming more common. It is known to spread in crowded settings, in jails and prisons, and in situations where there is close skin-to-skin contact, such as during sporting events or in locker rooms. MRSA can be spread through shared items, such as children's toys, razors, towels, or sports equipment.  CAUSES  All staph, including MRSA, are normally harmless unless they enter the body through a scratch, cut, or wound, such as with surgery. All staph, including MRSA, can be spread from person-to-person by touching contaminated objects or through direct contact. SPECIAL GROUPS MRSA can present problems for special groups of people. Some of these groups include:  Breastfeeding women.   The most common  problem is MRSA infection of the breast (mastitis). There is evidence that MRSA can be passed to an infant from infected breast milk. Your caregiver may recommend that you stop breastfeeding until the mastitis is under control.   If you are breastfeeding and have a MRSA infection in a place other than the breast, you may usually continue breastfeeding while under treatment. If taking antibiotics, ask your caregiver if it is safe to continue breastfeeding while taking your prescribed medicines.   Neonates (babies from birth to 69 month old) and infants (babies from 38 month to 61 year old).   There is evidence that MRSA can be passed to a newborn at birth if the mother has MRSA on the skin, in or around the birth canal, or an infection in the uterus, cervix, or vagina. MRSA infection can have the same appearance as a normal newborn or infant rash or several other skin infections. This can make it hard to diagnose MRSA.   Immune compromised people.   If you have an immune system problem, you may have a higher chance of developing a MRSA infection.   People after any type of surgery.   Staph in general, including MRSA, is the most common cause of infections occurring at the site of recent surgery.   People on long-term steroid medicines.   These kinds of medicines can lower your resistance to infection. This can increase your chance of getting MRSA.   People who have had frequent hospitalizations, live in nursing homes or other residential care facilities, have venous or urinary catheters, or have taken multiple courses of antibiotic therapy for any reason.  DIAGNOSIS  Diagnosis of MRSA is done by cultures of fluid samples that may come from:  Swabs taken from cuts or wounds in infected areas.   Nasal swabs.   Saliva or deep cough specimens from the lungs (sputum).   Urine.   Blood.  Many people are "colonized" with MRSA but have no signs of infection. This means that people carry the MRSA  germ on their skin or in their nose and may never develop MRSA infection.  TREATMENT  Treatment varies and is based on how serious, how deep, or how extensive the infection is. For example:  Some skin infections, such as a small boil or abscess, may be treated by draining yellowish-white fluid (pus) from the site of the infection.   Deeper or more widespread soft tissue infections are usually treated with surgery to drain pus and with antibiotic medicine given by vein or by mouth. This may be recommended even if you are pregnant.   Serious infections may require a hospital stay.  If antibiotics are given, they may be needed for several weeks. PREVENTION  Because many people are colonized with staph, including MRSA, preventing the spread of the bacteria from person-to-person is most important. The best way to prevent the spread of bacteria and other germs is through proper hand washing or by using alcohol-based hand disinfectants. The following are other ways to help prevent MRSA infection within the hospital and community settings.   Healthcare settings:   Strict hand washing or hand disinfection procedures need to be followed before and after touching every patient.   Patients infected with MRSA are placed in isolation to prevent the spread of the bacteria.   Healthcare workers need to wear disposable gowns and gloves when touching or caring for patients infected with MRSA. Visitors may also be asked to wear a gown and gloves.   Hospital surfaces need to be disinfected frequently.   Community settings:   NIKE frequently with soap and water for at least 15 seconds. Otherwise, use alcohol-based hand disinfectants when soap and water is not available.   Make sure people who live with you wash their hands often, too.   Do not share personal items. For example, avoid sharing razors and other personal hygiene items, towels, clothing, and athletic equipment.   Wash and dry your  clothes and bedding at the warmest temperatures recommended on the labels.   Keep wounds covered. Pus from infected sores may contain MRSA and other bacteria. Keep cuts and abrasions clean and covered with germ-free (sterile), dry bandages until they are healed.   If you have a wound that appears infected, ask your caregiver if a culture for MRSA and other bacteria should be done.   If you are breastfeeding, talk to your caregiver about MRSA. You may be asked to temporarily stop breastfeeding.  HOME CARE INSTRUCTIONS   Take your antibiotics as directed. Finish them even if you start to feel better.   Avoid close contact with those around you as much as possible. Do not use towels, razors, toothbrushes, bedding, or other items that will be used by others.   To fight the infection, follow your caregiver's instructions for wound care. Wash your hands before and after changing your bandages.   If you have an intravascular device, such as a catheter, make sure you know how to care for it.   Be sure to tell any healthcare providers that you have MRSA so they are aware of your infection.  SEEK IMMEDIATE MEDICAL  CARE IF:   The infection appears to be getting worse. Signs include:   Increased warmth, redness, or tenderness around the wound site.   A red line that extends from the infection site.   A dark color in the area around the infection.   Wound drainage that is tan, yellow, or green.   A bad smell coming from the wound.   You feel sick to your stomach (nauseous) and throw up (vomit) or cannot keep medicine down.   You have a fever.   Your baby is older than 3 months with a rectal temperature of 102 F (38.9 C) or higher.   Your baby is 71 months old or younger with a rectal temperature of 100.4 F (38 C) or higher.   You have difficulty breathing.  MAKE SURE YOU:   Understand these instructions.   Will watch your condition.   Will get help right away if you are not doing  well or get worse.  Document Released: 12/07/2005 Document Revised: 11/26/2011 Document Reviewed: 03/11/2011 [facility] Patient Information 2012 [place], [place].

## 2012-09-15 NOTE — Progress Notes (Signed)
Carrie Chung DOB: 1941/08/09 MRN: [id number]                                                                                      DATE: 09/15/2012  PCP: Margaree Mackintosh, MD Referring Provider: Margaree Mackintosh, MD  IMPRESSION:  Right buttock abscess, likely MRSA  PLAN:   Recommended that we open this up to drain. She will need to take the doxy as prescribed by Dr Lenord Fellers. She is agreable.  PROCEDURE NOTE The area was anesthetized proximally 6 cc of 1% Xylocaine with epinephrine. I waited approximately 7 minutes. The area was then prepped with Betadine. I made an elliptical incision over the necrotic area in the center and a small abscess cavity with a lot of indurated necrotic tissue present. A culture was taken and a pack placed using iodoform gauze. There is no significant bleeding. The patient tolerated this well. Sterile dressing was applied.  CC:  Chief Complaint  Patient presents with  . Abscess    Right buttock    HPI:  Carrie Chung is a 71 y.o.  female who presents for evaluation of a right buttock abscess. This is been developing over the last 4 days and has become increasingly painful. She does not know anything at this time because this to occur. She's had a prior axillary superficial abscess that resolved without drainage. She seen by her primary care physician today and sent for surgical evaluation. Of note is that she does take blood thinners.  PMH:  has a past medical history of Hypokalemia; HTN (hypertension); Sleep apnea; Endometrial polyp; CAD (coronary artery disease); Hyperglycemia; Hyperlipidemia; and Atrial tachycardia.  PSH:   has past surgical history that includes Cholecystectomy (1978); Appendectomy (71 years old); Tonsillectomy (71 years old); Cesarean section (1984); and Carotid stent (04/2012).  ALLERGIES:   Allergies  Allergen Reactions  . Penicillins Hives and Swelling  . Sulfonamide Derivatives Hives and Swelling  . Tetracycline Hives and  Swelling  . Zinc Oxide Rash    MEDICATIONS: Current outpatient prescriptions:acetaminophen (TYLENOL) 500 MG tablet, Take 1,000 mg by mouth every 6 (six) hours as needed. For pain, Disp: , Rfl: ;  amLODipine (NORVASC) 5 MG tablet, Take 1 tablet (5 mg total) by mouth daily., Disp: 30 tablet, Rfl: 11;  aspirin 81 MG tablet, Take 1 tablet (81 mg total) by mouth daily., Disp: , Rfl: ;  atorvastatin (LIPITOR) 80 MG tablet, Take 1 tablet (80 mg total) by mouth at bedtime., Disp: 30 tablet, Rfl: 11 Calcium Carbonate-Vitamin D (CALTRATE 600+D) 600-400 MG-UNIT per tablet, Take 1 tablet by mouth 2 (two) times daily. , Disp: , Rfl: ;  citalopram (CELEXA) 20 MG tablet, , Disp: , Rfl: ;  escitalopram (LEXAPRO) 20 MG tablet, Take 1 tablet (20 mg total) by mouth daily., Disp: 30 tablet, Rfl: 6;  FOLBIC 2.5-25-2 MG TABS, TAKE 1 TABLET EVERY DAY, Disp: 30 tablet, Rfl: 12;  HYDROcodone-acetaminophen (VICODIN) 5-500 MG per tablet, , Disp: , Rfl:  metoprolol (LOPRESSOR) 50 MG tablet, Take 1 tablet (50 mg total) by mouth 2 (two) times daily., Disp: 60 tablet, Rfl: 11;  NEXIUM 40 MG capsule, TAKE ONE CAPSULE  BY MOUTH EVERY DAY, Disp: 30 capsule, Rfl: 11;  nitroGLYCERIN (NITROSTAT) 0.4 MG SL tablet, Place 1 tablet (0.4 mg total) under the tongue every 5 (five) minutes x 3 doses as needed for chest pain., Disp: 25 tablet, Rfl: 4 potassium chloride SA (KLOR-CON M20) 20 MEQ tablet, Take 1 tablet (20 mEq total) by mouth daily., Disp: 30 tablet, Rfl: 6;  temazepam (RESTORIL) 30 MG capsule, Take 1 capsule (30 mg total) by mouth at bedtime as needed., Disp: 30 capsule, Rfl: 5;  Ticagrelor (BRILINTA) 90 MG TABS tablet, Take 1 tablet (90 mg total) by mouth 2 (two) times daily., Disp: 60 tablet, Rfl: 11  ROS: She has filled out our 12 point review of systems and it is negative except for a large lymph node in the inguinal area on the right side and some easy bruising.Marland Kitchen EXAM:   Vital signs:BP 144/80  Pulse 100  Temp 100.6 F (38.1 C)  (Temporal)  Resp 20  Ht 5\' 1"  (1.549 m)  Wt 149 lb (67.586 kg)  BMI 28.15 kg/m2 Gen.: Patient alert oriented healthy-appearing, NAD. Buttock: There is a buttock abscess presenting somewhat medially. There is an open necrotic area measuring 1 cm, with a 4 cm diameter area of erythema an approximately 6 cm area of induration. It is not appear to extend towards the rectum do not believe this is a perirectal abscess. It has the appearance of a MRSA abscess with little pus and fluctuance and significant tissue necrosis  DATA REVIEWED:  I have reviewed over the information in the electronic medical record.    Bennie Scaff J 09/15/2012  CC: Margaree Mackintosh, MD, Margaree Mackintosh, MD

## 2012-09-15 NOTE — Addendum Note (Signed)
Addended by: Judy Pimple on: 09/15/2012 03:49 PM   Modules accepted: Orders

## 2012-09-16 ENCOUNTER — Encounter (HOSPITAL_COMMUNITY): Payer: Self-pay

## 2012-09-18 LAB — WOUND CULTURE

## 2012-09-19 ENCOUNTER — Encounter (HOSPITAL_COMMUNITY)
Admission: RE | Admit: 2012-09-19 | Discharge: 2012-09-19 | Payer: Self-pay | Source: Ambulatory Visit | Attending: Cardiovascular Disease | Admitting: Cardiovascular Disease

## 2012-09-19 ENCOUNTER — Telehealth: Payer: Self-pay | Admitting: Cardiovascular Disease

## 2012-09-19 LAB — CULTURE, ROUTINE-ABSCESS: Gram Stain: NONE SEEN

## 2012-09-19 NOTE — Telephone Encounter (Signed)
plz return call to patient on cell [phone] regarding questions before next appnt.

## 2012-09-19 NOTE — Telephone Encounter (Signed)
I spoke with the pt and she is currently taking an antibiotic due to an infection.  The pt would like to reschedule her lab appointment to Thursday and then follow-up with Dr Excell Seltzer on Friday.  Appointment rescheduled.

## 2012-09-19 NOTE — Progress Notes (Signed)
Pt called to let cardiac rehab staff know that she will be absent from group exercise due to in office procedure she had on Thursday.  Pt is awaiting results from culture that was obtained during the draining of her abscess.  Pt is concerned that it may be MRSA. Pt has a follow up appointment on Thursday with surgeon.  Pt instructed not to return to cardiac maintenance group exercise until seen by MD on Thursday and culture results have been confirmed.  Pt verbalized understanding.

## 2012-09-20 ENCOUNTER — Other Ambulatory Visit: Payer: Medicare Other

## 2012-09-21 ENCOUNTER — Encounter (HOSPITAL_COMMUNITY): Payer: Self-pay

## 2012-09-21 DIAGNOSIS — Z5189 Encounter for other specified aftercare: Secondary | ICD-10-CM | POA: Insufficient documentation

## 2012-09-21 DIAGNOSIS — I214 Non-ST elevation (NSTEMI) myocardial infarction: Secondary | ICD-10-CM | POA: Insufficient documentation

## 2012-09-21 DIAGNOSIS — E785 Hyperlipidemia, unspecified: Secondary | ICD-10-CM | POA: Insufficient documentation

## 2012-09-21 DIAGNOSIS — Z9861 Coronary angioplasty status: Secondary | ICD-10-CM | POA: Insufficient documentation

## 2012-09-21 DIAGNOSIS — R9431 Abnormal electrocardiogram [ECG] [EKG]: Secondary | ICD-10-CM | POA: Insufficient documentation

## 2012-09-21 DIAGNOSIS — I10 Essential (primary) hypertension: Secondary | ICD-10-CM | POA: Insufficient documentation

## 2012-09-21 DIAGNOSIS — I2510 Atherosclerotic heart disease of native coronary artery without angina pectoris: Secondary | ICD-10-CM | POA: Insufficient documentation

## 2012-09-21 DIAGNOSIS — I498 Other specified cardiac arrhythmias: Secondary | ICD-10-CM | POA: Insufficient documentation

## 2012-09-22 ENCOUNTER — Encounter (INDEPENDENT_AMBULATORY_CARE_PROVIDER_SITE_OTHER): Payer: Self-pay | Admitting: Surgery

## 2012-09-22 ENCOUNTER — Ambulatory Visit (INDEPENDENT_AMBULATORY_CARE_PROVIDER_SITE_OTHER): Payer: Medicare Other | Admitting: Surgery

## 2012-09-22 ENCOUNTER — Other Ambulatory Visit (INDEPENDENT_AMBULATORY_CARE_PROVIDER_SITE_OTHER): Payer: Medicare Other

## 2012-09-22 VITALS — BP 160/88 | HR 76 | Temp 97.0°F | Resp 16 | Ht 61.0 in | Wt 146.4 lb

## 2012-09-22 DIAGNOSIS — Z09 Encounter for follow-up examination after completed treatment for conditions other than malignant neoplasm: Secondary | ICD-10-CM

## 2012-09-22 DIAGNOSIS — I2510 Atherosclerotic heart disease of native coronary artery without angina pectoris: Secondary | ICD-10-CM

## 2012-09-22 DIAGNOSIS — L0231 Cutaneous abscess of buttock: Secondary | ICD-10-CM

## 2012-09-22 DIAGNOSIS — I200 Unstable angina: Secondary | ICD-10-CM | POA: Diagnosis not present

## 2012-09-22 DIAGNOSIS — L03317 Cellulitis of buttock: Secondary | ICD-10-CM

## 2012-09-22 DIAGNOSIS — E7800 Pure hypercholesterolemia, unspecified: Secondary | ICD-10-CM

## 2012-09-22 LAB — HEPATIC FUNCTION PANEL
Albumin: 3.7 g/dL (ref 3.5–5.2)
Alkaline Phosphatase: 132 U/L — ABNORMAL HIGH (ref 39–117)
Total Bilirubin: 0.6 mg/dL (ref 0.3–1.2)

## 2012-09-22 LAB — LIPID PANEL
HDL: 45.8 mg/dL (ref >39.00)
Triglycerides: 117 mg/dL (ref 0.0–149.0)
VLDL: 23.4 mg/dL (ref 0.0–40.0)

## 2012-09-22 MED ORDER — DOXYCYCLINE HYCLATE 100 MG PO TABS: 100.0000 mg | ORAL_TABLET | Freq: Two times a day (BID) | ORAL | Status: DC

## 2012-09-22 NOTE — Patient Instructions (Signed)
Continue antibiotics another ten days. See me in two weeks if that has not completely healed

## 2012-09-22 NOTE — Progress Notes (Signed)
Chief complaint: Followup right buttock abscess  History of present illness: Patient notes that the area is much less tender. She still has a lot of drainage. He is not been having a fevers. She knows the culture showed MRSA. She has completed her antibiotics.  Exam: Vital signs:BP 160/88  Pulse 76  Temp 97 F (36.1 C) (Temporal)  Resp 16  Ht 5\' 1"  (1.549 m)  Wt 146 lb 6.4 oz (66.407 kg)  BMI 27.66 kg/m2 General: Patient is alert oriented healthy appearing Skin: The area of the buttock abscess is still somewhat indurated and firm. There is still purulent drainage but no significant cavity. This is consistent with a resolving MRSA abscess.  Impression: Improved but not yet completely healed  Plan: Continue local wound care and we are going to give her another 10 days of doxycycline therapy. I will see her in 2 weeks if this is not completely resolved.

## 2012-09-23 ENCOUNTER — Ambulatory Visit (INDEPENDENT_AMBULATORY_CARE_PROVIDER_SITE_OTHER): Payer: Medicare Other | Admitting: Cardiovascular Disease

## 2012-09-23 ENCOUNTER — Encounter: Payer: Self-pay | Admitting: Cardiovascular Disease

## 2012-09-23 ENCOUNTER — Encounter (HOSPITAL_COMMUNITY): Payer: Self-pay

## 2012-09-23 VITALS — BP 140/71 | HR 62 | Resp 18 | Ht 61.0 in | Wt 146.8 lb

## 2012-09-23 DIAGNOSIS — R000 Tachycardia, unspecified: Secondary | ICD-10-CM

## 2012-09-23 DIAGNOSIS — I10 Essential (primary) hypertension: Secondary | ICD-10-CM | POA: Diagnosis not present

## 2012-09-23 NOTE — Patient Instructions (Addendum)
Your physician has recommended you make the following change in your medication: DECREASE Lipitor to 40mg  daily  Your physician recommends that you return for a FASTING LIPID and LIVER profile in 3 MONTHS--nothing to eat or drink after midnight, lab opens at 8:30  Your physician recommends that you schedule a follow-up appointment in: 6 MONTHS  Your physician has requested that you have an exercise stress myoview in 6 MONTHS. For further information please visit [email or web address] Please follow instruction sheet, as given.

## 2012-09-23 NOTE — Progress Notes (Signed)
HPI:  71 year-old woman presenting for followup evaluation. The patient has multivessel coronary artery disease and presented in April with non-ST elevation MI. She underwent emergency stenting of the right coronary artery and staged PCI of the LAD and left circumflex vessels. She was treated with drug-eluting stents. Her left ventricular function is normal with an ejection fraction of 60%. She's doing well on the maintenance phase of cardiac rehabilitation.  From a symptomatic perspective, she is having episodes of chest discomfort with emotional anxiety. These are not as severe as chest pain she's had in the past. She has occasional chest discomfort with exercise but this is very mild and she tells me it's infrequent. At times her blood pressure goes up and she takes an extra metoprolol. This worked well at reducing her blood pressure. She denies edema, orthopnea, or PND. She's compliant with her medical program.  Outpatient Encounter Prescriptions as of 09/23/2012  Medication Sig Dispense Refill  . acetaminophen (TYLENOL) 500 MG tablet Take 1,000 mg by mouth every 6 (six) hours as needed. For pain      . amLODipine (NORVASC) 5 MG tablet Take 1 tablet (5 mg total) by mouth daily.  30 tablet  11  . aspirin 81 MG tablet Take 1 tablet (81 mg total) by mouth daily.      Marland Kitchen atorvastatin (LIPITOR) 80 MG tablet Take 1 tablet (80 mg total) by mouth at bedtime.  30 tablet  11  . Calcium Carbonate-Vitamin D (CALTRATE 600+D) 600-400 MG-UNIT per tablet Take 1 tablet by mouth 2 (two) times daily.       Marland Kitchen doxycycline (VIBRA-TABS) 100 MG tablet Take 1 tablet (100 mg total) by mouth 2 (two) times daily. For infection  28 tablet  0  . doxycycline (VIBRAMYCIN) 100 MG capsule       . escitalopram (LEXAPRO) 20 MG tablet Take 1 tablet (20 mg total) by mouth daily.  30 tablet  6  . fluconazole (DIFLUCAN) 150 MG tablet Take 1 tablet (150 mg total) by mouth once.  1 tablet  1  . FOLBIC 2.5-25-2 MG TABS TAKE 1 TABLET  EVERY DAY  30 tablet  12  . HYDROcodone-acetaminophen (VICODIN) 5-500 MG per tablet       . metoprolol (LOPRESSOR) 50 MG tablet Take 1 tablet (50 mg total) by mouth 2 (two) times daily.  60 tablet  11  . NEXIUM 40 MG capsule TAKE ONE CAPSULE BY MOUTH EVERY DAY  30 capsule  11  . nitroGLYCERIN (NITROSTAT) 0.4 MG SL tablet Place 1 tablet (0.4 mg total) under the tongue every 5 (five) minutes x 3 doses as needed for chest pain.  25 tablet  4  . potassium chloride SA (KLOR-CON M20) 20 MEQ tablet Take 1 tablet (20 mEq total) by mouth daily.  30 tablet  6  . temazepam (RESTORIL) 30 MG capsule Take 1 capsule (30 mg total) by mouth at bedtime as needed.  30 capsule  5  . Ticagrelor (BRILINTA) 90 MG TABS tablet Take 1 tablet (90 mg total) by mouth 2 (two) times daily.  60 tablet  11  . DISCONTD: citalopram (CELEXA) 20 MG tablet         Allergies  Allergen Reactions  . Penicillins Hives and Swelling  . Sulfonamide Derivatives Hives and Swelling  . Tetracycline Hives and Swelling  . Zinc Oxide Rash    Past Medical History  Diagnosis Date  . Hypokalemia   . HTN (hypertension)     essential  .  Sleep apnea   . Endometrial polyp   . CAD (coronary artery disease)     NSTEMI 03/2012 (LHC 03/27/12: pLAD 30%, mLAD 99%, mCFX 95%, mRCA 99% with thrombus, EF 65%);  s/p PTCA/DESx1 to prox-mid RCA 03/27/12 urgently in setting of hypotension/bradycardia, with staged PTCA/Evolve study stent to mid LAD & PTCA/Evolve study stent to prox LCx 03/29/12 ;   echo 03/28/12:EF 60%, Aortic sclerosis without AS, mild RVE, mild reduced RVSF    . Hyperglycemia     A1C 5.7 03/2012  . Hyperlipidemia   . Atrial tachycardia     noted at cardiac rehab 5/13; event monitor ordered to assess for AFib    ROS: Negative except as per HPI  BP 140/71  Pulse 62  Resp 18  Ht 5\' 1"  (1.549 m)  Wt 66.588 kg (146 lb 12.8 oz)  BMI 27.74 kg/m2  SpO2 98%  PHYSICAL EXAM: Pt is alert and oriented, NAD HEENT: normal Neck: JVP - normal,  carotids 2+= without bruits Lungs: CTA bilaterally CV: RRR without murmur or gallop Abd: soft, NT, Positive BS, no hepatomegaly Ext: no C/C/E, distal pulses intact and equal Skin: warm/dry no rash  EKG:  Sinus rhythm 63 beats per minute, within normal limits.  ASSESSMENT AND PLAN: 1. Coronary artery disease, native vessel. The patient is stable with CCS class II anginal symptoms. I think we should continue her same medical program with aspirin, brilinta, and metoprolol. She can continue to use metoprolol on an as needed basis for spikes in her blood pressure. This generally occurs with emotional stress. Because she has some continued symptoms of angina, I would recommend an exercise Myoview prior to her next office visit in 6 months.  2. Hyperlipidemia, mixed. The patient's liver function tests are mildly elevated and her cholesterol is very low. I've recommended that she reduce her Lipitor to 40 mg daily and have followup lipids and LFTs in 3 months.  3. Hypertension. We'll continue same medical program for her scheduled drugs, and also the continued use metoprolol on an as needed basis for spikes in her blood pressure.  Tonny Bollman 09/27/2012 7:38 AM

## 2012-09-24 DIAGNOSIS — Z23 Encounter for immunization: Secondary | ICD-10-CM | POA: Diagnosis not present

## 2012-09-26 ENCOUNTER — Encounter (HOSPITAL_COMMUNITY)
Admission: RE | Admit: 2012-09-26 | Discharge: 2012-09-26 | Disposition: A | Discharge status: Home / Self Care | Payer: Self-pay | Source: Ambulatory Visit | Attending: Cardiovascular Disease | Admitting: Cardiovascular Disease

## 2012-09-28 ENCOUNTER — Encounter (HOSPITAL_COMMUNITY)
Admission: RE | Admit: 2012-09-28 | Discharge: 2012-09-28 | Disposition: A | Discharge status: Home / Self Care | Payer: Self-pay | Source: Ambulatory Visit | Attending: Cardiovascular Disease | Admitting: Cardiovascular Disease

## 2012-09-30 ENCOUNTER — Encounter (HOSPITAL_COMMUNITY)
Admission: RE | Admit: 2012-09-30 | Discharge: 2012-09-30 | Disposition: A | Discharge status: Home / Self Care | Payer: Self-pay | Source: Ambulatory Visit | Attending: Cardiovascular Disease | Admitting: Cardiovascular Disease

## 2012-10-03 ENCOUNTER — Encounter (HOSPITAL_COMMUNITY)
Admission: RE | Admit: 2012-10-03 | Discharge: 2012-10-03 | Disposition: A | Discharge status: Home / Self Care | Payer: Self-pay | Source: Ambulatory Visit | Attending: Cardiovascular Disease | Admitting: Cardiovascular Disease

## 2012-10-04 ENCOUNTER — Encounter: Payer: Self-pay | Admitting: Internal Medicine

## 2012-10-04 ENCOUNTER — Ambulatory Visit (INDEPENDENT_AMBULATORY_CARE_PROVIDER_SITE_OTHER): Payer: Medicare Other | Admitting: Internal Medicine

## 2012-10-04 VITALS — BP 146/76 | HR 76 | Temp 98.3°F | Ht 61.0 in | Wt 148.0 lb

## 2012-10-04 DIAGNOSIS — F3289 Other specified depressive episodes: Secondary | ICD-10-CM

## 2012-10-04 DIAGNOSIS — F411 Generalized anxiety disorder: Secondary | ICD-10-CM | POA: Diagnosis not present

## 2012-10-04 DIAGNOSIS — F329 Major depressive disorder, single episode, unspecified: Secondary | ICD-10-CM | POA: Diagnosis not present

## 2012-10-04 DIAGNOSIS — F32A Depression, unspecified: Secondary | ICD-10-CM

## 2012-10-04 DIAGNOSIS — Z8614 Personal history of Methicillin resistant Staphylococcus aureus infection: Secondary | ICD-10-CM

## 2012-10-04 DIAGNOSIS — I10 Essential (primary) hypertension: Secondary | ICD-10-CM | POA: Diagnosis not present

## 2012-10-04 DIAGNOSIS — I2510 Atherosclerotic heart disease of native coronary artery without angina pectoris: Secondary | ICD-10-CM | POA: Diagnosis not present

## 2012-10-04 DIAGNOSIS — F419 Anxiety disorder, unspecified: Secondary | ICD-10-CM

## 2012-10-05 ENCOUNTER — Encounter (HOSPITAL_COMMUNITY)
Admission: RE | Admit: 2012-10-05 | Discharge: 2012-10-05 | Disposition: A | Discharge status: Home / Self Care | Payer: Self-pay | Source: Ambulatory Visit | Attending: Cardiovascular Disease | Admitting: Cardiovascular Disease

## 2012-10-07 ENCOUNTER — Other Ambulatory Visit: Payer: Self-pay | Admitting: Internal Medicine

## 2012-10-07 ENCOUNTER — Encounter (HOSPITAL_COMMUNITY)
Admission: RE | Admit: 2012-10-07 | Discharge: 2012-10-07 | Disposition: A | Discharge status: Home / Self Care | Payer: Self-pay | Source: Ambulatory Visit | Attending: Cardiovascular Disease | Admitting: Cardiovascular Disease

## 2012-10-10 ENCOUNTER — Encounter (HOSPITAL_COMMUNITY)
Admission: RE | Admit: 2012-10-10 | Discharge: 2012-10-10 | Disposition: A | Discharge status: Home / Self Care | Payer: Self-pay | Source: Ambulatory Visit | Attending: Cardiovascular Disease | Admitting: Cardiovascular Disease

## 2012-10-10 ENCOUNTER — Other Ambulatory Visit: Payer: Self-pay | Admitting: Internal Medicine

## 2012-10-10 MED ORDER — ESCITALOPRAM OXALATE 20 MG PO TABS: 20.0000 mg | ORAL_TABLET | Freq: Every day | ORAL | Status: DC

## 2012-10-12 ENCOUNTER — Encounter (HOSPITAL_COMMUNITY)
Admission: RE | Admit: 2012-10-12 | Discharge: 2012-10-12 | Disposition: A | Discharge status: Home / Self Care | Payer: Self-pay | Source: Ambulatory Visit | Attending: Cardiovascular Disease | Admitting: Cardiovascular Disease

## 2012-10-14 ENCOUNTER — Encounter (HOSPITAL_COMMUNITY)
Admission: RE | Admit: 2012-10-14 | Discharge: 2012-10-14 | Disposition: A | Discharge status: Home / Self Care | Payer: Self-pay | Source: Ambulatory Visit | Attending: Cardiovascular Disease | Admitting: Cardiovascular Disease

## 2012-10-17 ENCOUNTER — Encounter (HOSPITAL_COMMUNITY)
Admission: RE | Admit: 2012-10-17 | Discharge: 2012-10-17 | Disposition: A | Discharge status: Home / Self Care | Payer: Self-pay | Source: Ambulatory Visit | Attending: Cardiovascular Disease | Admitting: Cardiovascular Disease

## 2012-10-19 ENCOUNTER — Encounter (HOSPITAL_COMMUNITY)
Admission: RE | Admit: 2012-10-19 | Discharge: 2012-10-19 | Disposition: A | Discharge status: Home / Self Care | Payer: Self-pay | Source: Ambulatory Visit | Attending: Cardiovascular Disease | Admitting: Cardiovascular Disease

## 2012-10-21 ENCOUNTER — Encounter (HOSPITAL_COMMUNITY)
Admission: RE | Admit: 2012-10-21 | Discharge: 2012-10-21 | Disposition: A | Discharge status: Home / Self Care | Payer: Self-pay | Source: Ambulatory Visit | Attending: Cardiovascular Disease | Admitting: Cardiovascular Disease

## 2012-10-21 DIAGNOSIS — I214 Non-ST elevation (NSTEMI) myocardial infarction: Secondary | ICD-10-CM | POA: Insufficient documentation

## 2012-10-21 DIAGNOSIS — E785 Hyperlipidemia, unspecified: Secondary | ICD-10-CM | POA: Insufficient documentation

## 2012-10-21 DIAGNOSIS — I2510 Atherosclerotic heart disease of native coronary artery without angina pectoris: Secondary | ICD-10-CM | POA: Insufficient documentation

## 2012-10-21 DIAGNOSIS — Z5189 Encounter for other specified aftercare: Secondary | ICD-10-CM | POA: Insufficient documentation

## 2012-10-21 DIAGNOSIS — I10 Essential (primary) hypertension: Secondary | ICD-10-CM | POA: Insufficient documentation

## 2012-10-21 DIAGNOSIS — I498 Other specified cardiac arrhythmias: Secondary | ICD-10-CM | POA: Insufficient documentation

## 2012-10-21 DIAGNOSIS — Z9861 Coronary angioplasty status: Secondary | ICD-10-CM | POA: Insufficient documentation

## 2012-10-21 DIAGNOSIS — R9431 Abnormal electrocardiogram [ECG] [EKG]: Secondary | ICD-10-CM | POA: Insufficient documentation

## 2012-10-24 ENCOUNTER — Encounter (HOSPITAL_COMMUNITY)
Admission: RE | Admit: 2012-10-24 | Discharge: 2012-10-24 | Disposition: A | Discharge status: Home / Self Care | Payer: Self-pay | Source: Ambulatory Visit | Attending: Cardiovascular Disease | Admitting: Cardiovascular Disease

## 2012-10-26 ENCOUNTER — Encounter (HOSPITAL_COMMUNITY)
Admission: RE | Admit: 2012-10-26 | Discharge: 2012-10-26 | Disposition: A | Discharge status: Home / Self Care | Payer: Self-pay | Source: Ambulatory Visit | Attending: Cardiovascular Disease | Admitting: Cardiovascular Disease

## 2012-10-28 ENCOUNTER — Encounter (HOSPITAL_COMMUNITY)
Admission: RE | Admit: 2012-10-28 | Discharge: 2012-10-28 | Disposition: A | Discharge status: Home / Self Care | Payer: Self-pay | Source: Ambulatory Visit | Attending: Cardiovascular Disease | Admitting: Cardiovascular Disease

## 2012-10-31 ENCOUNTER — Encounter (HOSPITAL_COMMUNITY)
Admission: RE | Admit: 2012-10-31 | Discharge: 2012-10-31 | Disposition: A | Discharge status: Home / Self Care | Payer: Self-pay | Source: Ambulatory Visit | Attending: Cardiovascular Disease | Admitting: Cardiovascular Disease

## 2012-11-02 ENCOUNTER — Encounter (HOSPITAL_COMMUNITY)
Admission: RE | Admit: 2012-11-02 | Discharge: 2012-11-02 | Disposition: A | Discharge status: Home / Self Care | Payer: Self-pay | Source: Ambulatory Visit | Attending: Cardiovascular Disease | Admitting: Cardiovascular Disease

## 2012-11-04 ENCOUNTER — Encounter (HOSPITAL_COMMUNITY)
Admission: RE | Admit: 2012-11-04 | Discharge: 2012-11-04 | Disposition: A | Discharge status: Home / Self Care | Payer: Self-pay | Source: Ambulatory Visit | Attending: Cardiovascular Disease | Admitting: Cardiovascular Disease

## 2012-11-07 ENCOUNTER — Encounter (HOSPITAL_COMMUNITY)
Admission: RE | Admit: 2012-11-07 | Discharge: 2012-11-07 | Disposition: A | Discharge status: Home / Self Care | Payer: Self-pay | Source: Ambulatory Visit | Attending: Cardiovascular Disease | Admitting: Cardiovascular Disease

## 2012-11-07 ENCOUNTER — Other Ambulatory Visit: Payer: Self-pay | Admitting: Cardiovascular Disease

## 2012-11-09 ENCOUNTER — Encounter (HOSPITAL_COMMUNITY)
Admission: RE | Admit: 2012-11-09 | Discharge: 2012-11-09 | Disposition: A | Discharge status: Home / Self Care | Payer: Self-pay | Source: Ambulatory Visit | Attending: Cardiovascular Disease | Admitting: Cardiovascular Disease

## 2012-11-10 ENCOUNTER — Ambulatory Visit: Payer: Medicare Other | Admitting: Internal Medicine

## 2012-11-11 ENCOUNTER — Encounter (HOSPITAL_COMMUNITY)
Admission: RE | Admit: 2012-11-11 | Discharge: 2012-11-11 | Disposition: A | Discharge status: Home / Self Care | Payer: Self-pay | Source: Ambulatory Visit | Attending: Cardiovascular Disease | Admitting: Cardiovascular Disease

## 2012-11-14 ENCOUNTER — Encounter (HOSPITAL_COMMUNITY)
Admission: RE | Admit: 2012-11-14 | Discharge: 2012-11-14 | Disposition: A | Discharge status: Home / Self Care | Payer: Self-pay | Source: Ambulatory Visit | Attending: Cardiovascular Disease | Admitting: Cardiovascular Disease

## 2012-11-16 ENCOUNTER — Encounter (HOSPITAL_COMMUNITY)
Admission: RE | Admit: 2012-11-16 | Discharge: 2012-11-16 | Disposition: A | Discharge status: Home / Self Care | Payer: Self-pay | Source: Ambulatory Visit | Attending: Cardiovascular Disease | Admitting: Cardiovascular Disease

## 2012-11-20 ENCOUNTER — Encounter: Payer: Self-pay | Admitting: Internal Medicine

## 2012-11-20 DIAGNOSIS — Z8614 Personal history of Methicillin resistant Staphylococcus aureus infection: Secondary | ICD-10-CM | POA: Insufficient documentation

## 2012-11-20 NOTE — Progress Notes (Signed)
  Subjective:    Patient ID: Carrie Chung, female    DOB: 1941-05-15, 71 y.o.   MRN: [id number]  HPI 71 year old female with coronary artery disease and hypertension in today to discuss anxiety. 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 She is on Lexapro for that and mood has improved. Recently had carbuncle drained with MRSA. This was not pleasant. She was to take a trip in the near future to [place] to see her mother.  She is a bit worried about making the trip given her recent health problems. She does go to cardiac rehabilitation. 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  She realizes that all of this related to recent health issues has made her feel a bit out of control. She is to used to control and her life. This seems to precipitate some anxiety.  Her affect is pleasant. She is not despondent. He had a very frank discussion about anxiety.    Review of Systems     Objective:   Physical Exam Neck is supple without thyromegaly, JVD, or adenopathy, chest is clear to auscultation, cardiac exam regular rate and rhythm normal S1 and S2, extremities without edema, skin is warm and dry and area on but he is healing nicely where I&D was done. Affect is normal       Assessment & Plan:  Depression  Anxiety  Coronary artery disease  Hypertension  Recent carbuncle on but with MRSA  Plan: Patient will be given antianxiety medication. Prescription for Xanax 0.5 mg to take twice daily as needed. Return in 4-6 months or as needed.

## 2012-11-20 NOTE — Patient Instructions (Addendum)
Takes Xanax as needed for anxiety. Continue Lexapro. Return in 4-6 months.

## 2012-11-21 ENCOUNTER — Encounter (HOSPITAL_COMMUNITY)
Admission: RE | Admit: 2012-11-21 | Discharge: 2012-11-21 | Disposition: A | Discharge status: Home / Self Care | Payer: Self-pay | Source: Ambulatory Visit | Attending: Cardiovascular Disease | Admitting: Cardiovascular Disease

## 2012-11-21 DIAGNOSIS — Z9861 Coronary angioplasty status: Secondary | ICD-10-CM | POA: Insufficient documentation

## 2012-11-21 DIAGNOSIS — I10 Essential (primary) hypertension: Secondary | ICD-10-CM | POA: Insufficient documentation

## 2012-11-21 DIAGNOSIS — I2510 Atherosclerotic heart disease of native coronary artery without angina pectoris: Secondary | ICD-10-CM | POA: Insufficient documentation

## 2012-11-21 DIAGNOSIS — I498 Other specified cardiac arrhythmias: Secondary | ICD-10-CM | POA: Insufficient documentation

## 2012-11-21 DIAGNOSIS — I214 Non-ST elevation (NSTEMI) myocardial infarction: Secondary | ICD-10-CM | POA: Insufficient documentation

## 2012-11-21 DIAGNOSIS — E785 Hyperlipidemia, unspecified: Secondary | ICD-10-CM | POA: Insufficient documentation

## 2012-11-21 DIAGNOSIS — R9431 Abnormal electrocardiogram [ECG] [EKG]: Secondary | ICD-10-CM | POA: Insufficient documentation

## 2012-11-21 DIAGNOSIS — Z5189 Encounter for other specified aftercare: Secondary | ICD-10-CM | POA: Insufficient documentation

## 2012-11-23 ENCOUNTER — Encounter (HOSPITAL_COMMUNITY)
Admission: RE | Admit: 2012-11-23 | Discharge: 2012-11-23 | Disposition: A | Discharge status: Home / Self Care | Payer: Self-pay | Source: Ambulatory Visit | Attending: Cardiovascular Disease | Admitting: Cardiovascular Disease

## 2012-11-25 ENCOUNTER — Encounter (HOSPITAL_COMMUNITY)
Admission: RE | Admit: 2012-11-25 | Discharge: 2012-11-25 | Disposition: A | Discharge status: Home / Self Care | Payer: Self-pay | Source: Ambulatory Visit | Attending: Cardiovascular Disease | Admitting: Cardiovascular Disease

## 2012-11-28 ENCOUNTER — Encounter (HOSPITAL_COMMUNITY)
Admission: RE | Admit: 2012-11-28 | Discharge: 2012-11-28 | Disposition: A | Discharge status: Home / Self Care | Payer: Self-pay | Source: Ambulatory Visit | Attending: Cardiovascular Disease | Admitting: Cardiovascular Disease

## 2012-11-28 NOTE — Progress Notes (Signed)
Today at exercise, pt reported chest pain that radiated to her left shoulder during her second station of exercise. Pt rated the pain a 2 out of 10 on the pain scale. Per pt, symptoms resolved with rest prior to her reporting to me. BP was 148/78, oxygen saturation 98%, heart rate was 67 bpm. Pt reports that she has been having intermittent CP with exertion which she has not been reporting at home and at exercise. I discussed the importance of reporting symptoms to staff and also communicating with her cardiologist's office as needed. Pt acknowledges that she has been in some denial regarding symptoms. She states that she has experienced mild chest pain when going up stairs and other exertion that she occassionally works through. Pt has not taken NTG for pain and symptoms do resolve with rest. Pt was asymptomatic at exit BP 118/60, HR 60 bpm. Will alert Dr. Excell Seltzer pt's cardiologist regarding angina with exertion.

## 2012-11-29 ENCOUNTER — Telehealth: Payer: Self-pay

## 2012-11-29 DIAGNOSIS — R072 Precordial pain: Secondary | ICD-10-CM

## 2012-11-29 NOTE — Telephone Encounter (Signed)
The pt is scheduled for myoview on 12/01/12.

## 2012-11-29 NOTE — Telephone Encounter (Signed)
Thank you! I called her and I'm going to get her set up with a stress test. Appreciate your care! Earnie Rockhold - could you get her set up for an exercise Myoview? thx   Message ----- From: Annetta Maw Sent: 11/28/2012 10:50 AM To: Tonny Bollman, MD Subject: Pt having angina with exertion Greetings Dr. Excell Seltzer, Mrs. Levay exercises in the Cardiac Maintenance program, and did report some chest pain today during her session. Per pt she had CP which radiated to the left shoulder during her second station of exercise. She rated the pain a "2" out of 10 on the pain scale. Per pt symptoms resolved with rest. BP was 148/78 Sa02 98% HR 67 bpm.Pt did not resume exercise but participated in the cool-down without further c/o. Per pt she does have intermittent episodes of pain with exertion here and at home, but thus far she has not had to take NTG and has had resolution with rest. Pt is some what anxious because she has a trip to [place] planned on 12/07/12. Please advise as necessary. Cristy Hilts

## 2012-11-30 ENCOUNTER — Encounter (HOSPITAL_COMMUNITY): Payer: Self-pay

## 2012-12-01 ENCOUNTER — Ambulatory Visit (HOSPITAL_COMMUNITY): Payer: Medicare Other | Attending: Internal Medicine | Admitting: Radiology

## 2012-12-01 ENCOUNTER — Encounter: Payer: Self-pay | Admitting: Cardiovascular Disease

## 2012-12-01 VITALS — BP 173/83 | Ht 62.0 in | Wt 148.0 lb

## 2012-12-01 DIAGNOSIS — R0602 Shortness of breath: Secondary | ICD-10-CM | POA: Diagnosis not present

## 2012-12-01 DIAGNOSIS — I10 Essential (primary) hypertension: Secondary | ICD-10-CM | POA: Insufficient documentation

## 2012-12-01 DIAGNOSIS — R079 Chest pain, unspecified: Secondary | ICD-10-CM | POA: Insufficient documentation

## 2012-12-01 DIAGNOSIS — R0609 Other forms of dyspnea: Secondary | ICD-10-CM | POA: Insufficient documentation

## 2012-12-01 DIAGNOSIS — R072 Precordial pain: Secondary | ICD-10-CM

## 2012-12-01 DIAGNOSIS — I2510 Atherosclerotic heart disease of native coronary artery without angina pectoris: Secondary | ICD-10-CM

## 2012-12-01 DIAGNOSIS — R0989 Other specified symptoms and signs involving the circulatory and respiratory systems: Secondary | ICD-10-CM | POA: Diagnosis not present

## 2012-12-01 MED ORDER — TECHNETIUM TC 99M SESTAMIBI GENERIC - CARDIOLITE
10.0000 | Freq: Once | INTRAVENOUS | Status: AC | PRN
  Administered 2012-12-01: 10 via INTRAVENOUS

## 2012-12-01 MED ORDER — TECHNETIUM TC 99M SESTAMIBI GENERIC - CARDIOLITE
30.0000 | Freq: Once | INTRAVENOUS | Status: AC | PRN
  Administered 2012-12-01: 30 via INTRAVENOUS

## 2012-12-01 NOTE — Progress Notes (Signed)
[facility] 3 NUCLEAR MED [address] [place], [place] [address] [phone]    Cardiology Nuclear Med Study  Carrie Chung is a 71 y.o. female     MRN : [id number]     DOB: 18-Sep-1941  Procedure Date: 12/01/2012  Nuclear Med Background Indication for Stress Test:  Evaluation for Ischemia, Stent Patency, PTCA Patency and Abnormal EKG: Patient normal EKG has T wave inversions per patient. History:  4/13'Angioplasty;4/13' Echo EF60%;03/27/12 Heart Catheterization EF 65%,mLAD 99%,mRCA 99%,mCFX 95%;03/26/42 Myocardial Infarction NSTEMI;4/13 STENTS  RCA,LAD,CFX Cardiac Risk Factors: Hypertension and Lipids  Symptoms:  Chest Pain and DOE   Nuclear Pre-Procedure Caffeine/Decaff Intake:  None NPO After: 6:30am   Lungs:  clear O2 Sat: 95% on room air. IV 0.9% NS with Angio Cath:  22g  IV Site: R Hand  IV Started by:  Cathlyn Parsons, RN  Chest Size (in):  36 Cup Size: C  Height: 5\' 2"  (1.575 m)  Weight:  148 lb (67.132 kg)  BMI:  Body mass index is 27.07 kg/(m^2). Tech Comments:  Lopressor held x 30 hrs    Nuclear Med Study 1 or 2 day study: 1 day  Stress Test Type:  Stress  Reading MD: Arvilla Meres, MD  Order Authorizing Provider:  Casimiro Needle Cooper,MD  Resting Radionuclide: Technetium 78m Sestamibi  Resting Radionuclide Dose: 11.0 mCi   Stress Radionuclide:  Technetium 11m Sestamibi  Stress Radionuclide Dose: 33.0 mCi           Stress Protocol Rest HR: 73 Stress HR: 141  Rest BP: 173/83 Stress BP: 203/78  Exercise Time (min): 5:31 METS: 6.90   Predicted Max HR: 148 bpm % Max HR: 95.27 bpm Rate Pressure Product: 96295    Dose of Adenosine (mg):  n/a Dose of Lexiscan: n/a mg  Dose of Atropine (mg): n/a Dose of Dobutamine: n/a mcg/kg/min (at max HR)  Stress Test Technologist: Frederick Peers, EMT-P  Nuclear Technologist:  Domenic Polite, CNMT     Rest Procedure:  Myocardial perfusion imaging was performed at rest 45 minutes following the intravenous  administration of Technetium 58m Sestamibi. Rest ECG: NSR - Normal EKG  Stress Procedure:  The patient exercised on the treadmill utilizing the Bruce Protocol for 5:31 minutes. The patient stopped due to SOB and denied any chest pain.  Technetium 74m Sestamibi was injected at peak exercise and myocardial perfusion imaging was performed after a brief delay. Stress ECG: No significant change from baseline ECG  QPS Raw Data Images:  Patient motion noted. Stress Images:  Normal homogeneous uptake in all areas of the myocardium. Rest Images:  Normal homogeneous uptake in all areas of the myocardium. Subtraction (SDS):  No evidence of ischemia. Transient Ischemic Dilatation (Normal <1.22):  0.97 Lung/Heart Ratio (Normal <0.45):  0.19  Quantitative Gated Spect Images QGS EDV:  64 ml QGS ESV:  13 ml  Impression Exercise Capacity:  Fair exercise capacity. BP Response:  Hypertensive blood pressure response. Clinical Symptoms:  There is dyspnea. ECG Impression:  No significant ST segment change suggestive of ischemia. Comparison with Prior Nuclear Study: No images to compare  Overall Impression:  Normal stress nuclear study.  LV Ejection Fraction: 79%.  LV Wall Motion:  NL LV Function; NL Wall Motion  Charlton Haws

## 2012-12-02 ENCOUNTER — Encounter (HOSPITAL_COMMUNITY)
Admission: RE | Admit: 2012-12-02 | Discharge: 2012-12-02 | Disposition: A | Discharge status: Home / Self Care | Payer: Self-pay | Source: Ambulatory Visit | Attending: Cardiovascular Disease | Admitting: Cardiovascular Disease

## 2012-12-02 ENCOUNTER — Encounter: Payer: Self-pay | Admitting: Cardiovascular Disease

## 2012-12-02 NOTE — Telephone Encounter (Signed)
This encounter was created in error - please disregard.

## 2012-12-02 NOTE — Telephone Encounter (Signed)
New problem:   Test results.    Suggestion from Dr. Excell Seltzer on trip to [place].

## 2012-12-05 ENCOUNTER — Encounter (HOSPITAL_COMMUNITY)
Admission: RE | Admit: 2012-12-05 | Discharge: 2012-12-05 | Disposition: A | Discharge status: Home / Self Care | Payer: Self-pay | Source: Ambulatory Visit | Attending: Cardiovascular Disease | Admitting: Cardiovascular Disease

## 2012-12-07 ENCOUNTER — Encounter (HOSPITAL_COMMUNITY): Payer: Self-pay

## 2012-12-09 ENCOUNTER — Encounter (HOSPITAL_COMMUNITY): Payer: Self-pay

## 2012-12-12 ENCOUNTER — Encounter (HOSPITAL_COMMUNITY): Payer: Self-pay

## 2012-12-16 ENCOUNTER — Encounter (HOSPITAL_COMMUNITY)
Admission: RE | Admit: 2012-12-16 | Discharge: 2012-12-16 | Disposition: A | Discharge status: Home / Self Care | Payer: Self-pay | Source: Ambulatory Visit | Attending: Cardiovascular Disease | Admitting: Cardiovascular Disease

## 2012-12-19 ENCOUNTER — Encounter (HOSPITAL_COMMUNITY)
Admission: RE | Admit: 2012-12-19 | Discharge: 2012-12-19 | Disposition: A | Discharge status: Home / Self Care | Payer: Self-pay | Source: Ambulatory Visit | Attending: Cardiovascular Disease | Admitting: Cardiovascular Disease

## 2012-12-22 ENCOUNTER — Other Ambulatory Visit: Payer: Self-pay

## 2012-12-22 MED ORDER — TEMAZEPAM 30 MG PO CAPS: 30.0000 mg | ORAL_CAPSULE | Freq: Every evening | ORAL | Status: DC | PRN

## 2012-12-23 ENCOUNTER — Encounter (HOSPITAL_COMMUNITY)
Admission: RE | Admit: 2012-12-23 | Discharge: 2012-12-23 | Disposition: A | Discharge status: Home / Self Care | Payer: Self-pay | Source: Ambulatory Visit | Attending: Cardiovascular Disease | Admitting: Cardiovascular Disease

## 2012-12-23 DIAGNOSIS — I214 Non-ST elevation (NSTEMI) myocardial infarction: Secondary | ICD-10-CM | POA: Insufficient documentation

## 2012-12-23 DIAGNOSIS — I498 Other specified cardiac arrhythmias: Secondary | ICD-10-CM | POA: Insufficient documentation

## 2012-12-23 DIAGNOSIS — E785 Hyperlipidemia, unspecified: Secondary | ICD-10-CM | POA: Insufficient documentation

## 2012-12-23 DIAGNOSIS — I2510 Atherosclerotic heart disease of native coronary artery without angina pectoris: Secondary | ICD-10-CM | POA: Insufficient documentation

## 2012-12-23 DIAGNOSIS — R9431 Abnormal electrocardiogram [ECG] [EKG]: Secondary | ICD-10-CM | POA: Insufficient documentation

## 2012-12-23 DIAGNOSIS — Z5189 Encounter for other specified aftercare: Secondary | ICD-10-CM | POA: Insufficient documentation

## 2012-12-23 DIAGNOSIS — I10 Essential (primary) hypertension: Secondary | ICD-10-CM | POA: Insufficient documentation

## 2012-12-23 DIAGNOSIS — Z9861 Coronary angioplasty status: Secondary | ICD-10-CM | POA: Insufficient documentation

## 2012-12-26 ENCOUNTER — Encounter (HOSPITAL_COMMUNITY)
Admission: RE | Admit: 2012-12-26 | Discharge: 2012-12-26 | Disposition: A | Discharge status: Home / Self Care | Payer: Self-pay | Source: Ambulatory Visit | Attending: Cardiovascular Disease | Admitting: Cardiovascular Disease

## 2012-12-28 ENCOUNTER — Encounter (HOSPITAL_COMMUNITY)
Admission: RE | Admit: 2012-12-28 | Discharge: 2012-12-28 | Disposition: A | Discharge status: Home / Self Care | Payer: Self-pay | Source: Ambulatory Visit | Attending: Cardiovascular Disease | Admitting: Cardiovascular Disease

## 2012-12-29 ENCOUNTER — Other Ambulatory Visit (INDEPENDENT_AMBULATORY_CARE_PROVIDER_SITE_OTHER): Payer: Medicare Other

## 2012-12-29 DIAGNOSIS — I10 Essential (primary) hypertension: Secondary | ICD-10-CM

## 2012-12-29 DIAGNOSIS — R000 Tachycardia, unspecified: Secondary | ICD-10-CM

## 2012-12-29 LAB — HEPATIC FUNCTION PANEL
AST: 22 U/L (ref 0–37)
Alkaline Phosphatase: 70 U/L (ref 39–117)
Bilirubin, Direct: 0.1 mg/dL (ref 0.0–0.3)
Total Protein: 6.7 g/dL (ref 6.0–8.3)

## 2012-12-29 LAB — LIPID PANEL
Cholesterol: 127 mg/dL (ref 0–200)
LDL Cholesterol: 42 mg/dL (ref 0–99)
Total CHOL/HDL Ratio: 2
Triglycerides: 138 mg/dL (ref 0.0–149.0)

## 2012-12-30 ENCOUNTER — Encounter (HOSPITAL_COMMUNITY)
Admission: RE | Admit: 2012-12-30 | Discharge: 2012-12-30 | Disposition: A | Discharge status: Home / Self Care | Payer: Self-pay | Source: Ambulatory Visit | Attending: Cardiovascular Disease | Admitting: Cardiovascular Disease

## 2013-01-02 ENCOUNTER — Encounter (HOSPITAL_COMMUNITY)
Admission: RE | Admit: 2013-01-02 | Discharge: 2013-01-02 | Disposition: A | Discharge status: Home / Self Care | Payer: Self-pay | Source: Ambulatory Visit | Attending: Cardiovascular Disease | Admitting: Cardiovascular Disease

## 2013-01-04 ENCOUNTER — Encounter (HOSPITAL_COMMUNITY)
Admission: RE | Admit: 2013-01-04 | Discharge: 2013-01-04 | Disposition: A | Discharge status: Home / Self Care | Payer: Self-pay | Source: Ambulatory Visit | Attending: Cardiovascular Disease | Admitting: Cardiovascular Disease

## 2013-01-06 ENCOUNTER — Encounter (HOSPITAL_COMMUNITY)
Admission: RE | Admit: 2013-01-06 | Discharge: 2013-01-06 | Disposition: A | Discharge status: Home / Self Care | Payer: Self-pay | Source: Ambulatory Visit | Attending: Cardiovascular Disease | Admitting: Cardiovascular Disease

## 2013-01-09 ENCOUNTER — Encounter (HOSPITAL_COMMUNITY)
Admission: RE | Admit: 2013-01-09 | Discharge: 2013-01-09 | Disposition: A | Discharge status: Home / Self Care | Payer: Self-pay | Source: Ambulatory Visit | Attending: Cardiovascular Disease | Admitting: Cardiovascular Disease

## 2013-01-11 ENCOUNTER — Encounter (HOSPITAL_COMMUNITY): Payer: Self-pay

## 2013-01-13 ENCOUNTER — Encounter (HOSPITAL_COMMUNITY): Payer: Self-pay

## 2013-01-16 ENCOUNTER — Encounter (HOSPITAL_COMMUNITY): Admission: RE | Admit: 2013-01-16 | Payer: Self-pay | Source: Ambulatory Visit

## 2013-01-18 ENCOUNTER — Encounter (HOSPITAL_COMMUNITY)
Admission: RE | Admit: 2013-01-18 | Discharge: 2013-01-18 | Disposition: A | Discharge status: Home / Self Care | Payer: Self-pay | Source: Ambulatory Visit | Attending: Cardiovascular Disease | Admitting: Cardiovascular Disease

## 2013-01-20 ENCOUNTER — Encounter (HOSPITAL_COMMUNITY)
Admission: RE | Admit: 2013-01-20 | Discharge: 2013-01-20 | Disposition: A | Discharge status: Home / Self Care | Payer: Self-pay | Source: Ambulatory Visit | Attending: Cardiovascular Disease | Admitting: Cardiovascular Disease

## 2013-01-23 ENCOUNTER — Encounter (HOSPITAL_COMMUNITY)
Admission: RE | Admit: 2013-01-23 | Discharge: 2013-01-23 | Disposition: A | Discharge status: Home / Self Care | Payer: Self-pay | Source: Ambulatory Visit | Attending: Cardiovascular Disease | Admitting: Cardiovascular Disease

## 2013-01-23 DIAGNOSIS — I10 Essential (primary) hypertension: Secondary | ICD-10-CM | POA: Insufficient documentation

## 2013-01-23 DIAGNOSIS — R9431 Abnormal electrocardiogram [ECG] [EKG]: Secondary | ICD-10-CM | POA: Insufficient documentation

## 2013-01-23 DIAGNOSIS — I498 Other specified cardiac arrhythmias: Secondary | ICD-10-CM | POA: Insufficient documentation

## 2013-01-23 DIAGNOSIS — I214 Non-ST elevation (NSTEMI) myocardial infarction: Secondary | ICD-10-CM | POA: Insufficient documentation

## 2013-01-23 DIAGNOSIS — I2510 Atherosclerotic heart disease of native coronary artery without angina pectoris: Secondary | ICD-10-CM | POA: Insufficient documentation

## 2013-01-23 DIAGNOSIS — E785 Hyperlipidemia, unspecified: Secondary | ICD-10-CM | POA: Insufficient documentation

## 2013-01-23 DIAGNOSIS — Z9861 Coronary angioplasty status: Secondary | ICD-10-CM | POA: Insufficient documentation

## 2013-01-23 DIAGNOSIS — Z5189 Encounter for other specified aftercare: Secondary | ICD-10-CM | POA: Insufficient documentation

## 2013-01-25 ENCOUNTER — Encounter (HOSPITAL_COMMUNITY)
Admission: RE | Admit: 2013-01-25 | Discharge: 2013-01-25 | Disposition: A | Discharge status: Home / Self Care | Payer: Self-pay | Source: Ambulatory Visit | Attending: Cardiovascular Disease | Admitting: Cardiovascular Disease

## 2013-01-27 ENCOUNTER — Encounter (HOSPITAL_COMMUNITY)
Admission: RE | Admit: 2013-01-27 | Discharge: 2013-01-27 | Disposition: A | Discharge status: Home / Self Care | Payer: Self-pay | Source: Ambulatory Visit | Attending: Cardiovascular Disease | Admitting: Cardiovascular Disease

## 2013-01-30 ENCOUNTER — Encounter (HOSPITAL_COMMUNITY): Payer: Self-pay

## 2013-02-01 ENCOUNTER — Encounter (HOSPITAL_COMMUNITY)
Admission: RE | Admit: 2013-02-01 | Discharge: 2013-02-01 | Disposition: A | Discharge status: Home / Self Care | Payer: Self-pay | Source: Ambulatory Visit | Attending: Cardiovascular Disease | Admitting: Cardiovascular Disease

## 2013-02-03 ENCOUNTER — Encounter (HOSPITAL_COMMUNITY): Payer: Self-pay

## 2013-02-04 ENCOUNTER — Other Ambulatory Visit: Payer: Self-pay

## 2013-02-06 ENCOUNTER — Encounter (HOSPITAL_COMMUNITY)
Admission: RE | Admit: 2013-02-06 | Discharge: 2013-02-06 | Disposition: A | Discharge status: Home / Self Care | Payer: Self-pay | Source: Ambulatory Visit | Attending: Cardiovascular Disease | Admitting: Cardiovascular Disease

## 2013-02-06 ENCOUNTER — Other Ambulatory Visit: Payer: Self-pay | Admitting: Internal Medicine

## 2013-02-08 ENCOUNTER — Encounter (HOSPITAL_COMMUNITY)
Admission: RE | Admit: 2013-02-08 | Discharge: 2013-02-08 | Disposition: A | Discharge status: Home / Self Care | Payer: Self-pay | Source: Ambulatory Visit | Attending: Cardiovascular Disease | Admitting: Cardiovascular Disease

## 2013-02-10 ENCOUNTER — Encounter (HOSPITAL_COMMUNITY)
Admission: RE | Admit: 2013-02-10 | Discharge: 2013-02-10 | Disposition: A | Discharge status: Home / Self Care | Payer: Self-pay | Source: Ambulatory Visit | Attending: Cardiovascular Disease | Admitting: Cardiovascular Disease

## 2013-02-10 ENCOUNTER — Encounter: Payer: Self-pay | Admitting: Internal Medicine

## 2013-02-13 ENCOUNTER — Encounter (HOSPITAL_COMMUNITY)
Admission: RE | Admit: 2013-02-13 | Discharge: 2013-02-13 | Disposition: A | Discharge status: Home / Self Care | Payer: Self-pay | Source: Ambulatory Visit | Attending: Cardiovascular Disease | Admitting: Cardiovascular Disease

## 2013-02-15 ENCOUNTER — Encounter (HOSPITAL_COMMUNITY)
Admission: RE | Admit: 2013-02-15 | Discharge: 2013-02-15 | Disposition: A | Discharge status: Home / Self Care | Payer: Self-pay | Source: Ambulatory Visit | Attending: Cardiovascular Disease | Admitting: Cardiovascular Disease

## 2013-02-17 ENCOUNTER — Encounter (HOSPITAL_COMMUNITY)
Admission: RE | Admit: 2013-02-17 | Discharge: 2013-02-17 | Disposition: A | Discharge status: Home / Self Care | Payer: Self-pay | Source: Ambulatory Visit | Attending: Cardiovascular Disease | Admitting: Cardiovascular Disease

## 2013-02-20 ENCOUNTER — Encounter (HOSPITAL_COMMUNITY)
Admission: RE | Admit: 2013-02-20 | Discharge: 2013-02-20 | Disposition: A | Discharge status: Home / Self Care | Payer: Self-pay | Source: Ambulatory Visit | Attending: Cardiovascular Disease | Admitting: Cardiovascular Disease

## 2013-02-20 DIAGNOSIS — I2510 Atherosclerotic heart disease of native coronary artery without angina pectoris: Secondary | ICD-10-CM | POA: Insufficient documentation

## 2013-02-20 DIAGNOSIS — I498 Other specified cardiac arrhythmias: Secondary | ICD-10-CM | POA: Insufficient documentation

## 2013-02-20 DIAGNOSIS — E785 Hyperlipidemia, unspecified: Secondary | ICD-10-CM | POA: Insufficient documentation

## 2013-02-20 DIAGNOSIS — R9431 Abnormal electrocardiogram [ECG] [EKG]: Secondary | ICD-10-CM | POA: Insufficient documentation

## 2013-02-20 DIAGNOSIS — Z5189 Encounter for other specified aftercare: Secondary | ICD-10-CM | POA: Insufficient documentation

## 2013-02-20 DIAGNOSIS — I10 Essential (primary) hypertension: Secondary | ICD-10-CM | POA: Insufficient documentation

## 2013-02-20 DIAGNOSIS — Z9861 Coronary angioplasty status: Secondary | ICD-10-CM | POA: Insufficient documentation

## 2013-02-20 DIAGNOSIS — I214 Non-ST elevation (NSTEMI) myocardial infarction: Secondary | ICD-10-CM | POA: Insufficient documentation

## 2013-02-22 ENCOUNTER — Encounter (HOSPITAL_COMMUNITY)
Admission: RE | Admit: 2013-02-22 | Discharge: 2013-02-22 | Disposition: A | Discharge status: Home / Self Care | Payer: Self-pay | Source: Ambulatory Visit | Attending: Cardiovascular Disease | Admitting: Cardiovascular Disease

## 2013-02-24 ENCOUNTER — Encounter (HOSPITAL_COMMUNITY): Payer: Self-pay

## 2013-02-27 ENCOUNTER — Encounter (HOSPITAL_COMMUNITY)
Admission: RE | Admit: 2013-02-27 | Discharge: 2013-02-27 | Disposition: A | Discharge status: Home / Self Care | Payer: Self-pay | Source: Ambulatory Visit | Attending: Cardiovascular Disease | Admitting: Cardiovascular Disease

## 2013-03-01 ENCOUNTER — Encounter (HOSPITAL_COMMUNITY)
Admission: RE | Admit: 2013-03-01 | Discharge: 2013-03-01 | Disposition: A | Discharge status: Home / Self Care | Payer: Self-pay | Source: Ambulatory Visit | Attending: Cardiovascular Disease | Admitting: Cardiovascular Disease

## 2013-03-03 ENCOUNTER — Encounter (HOSPITAL_COMMUNITY)
Admission: RE | Admit: 2013-03-03 | Discharge: 2013-03-03 | Disposition: A | Discharge status: Home / Self Care | Payer: Self-pay | Source: Ambulatory Visit | Attending: Cardiovascular Disease | Admitting: Cardiovascular Disease

## 2013-03-06 ENCOUNTER — Encounter (HOSPITAL_COMMUNITY)
Admission: RE | Admit: 2013-03-06 | Discharge: 2013-03-06 | Disposition: A | Discharge status: Home / Self Care | Payer: Self-pay | Source: Ambulatory Visit | Attending: Cardiovascular Disease | Admitting: Cardiovascular Disease

## 2013-03-08 ENCOUNTER — Encounter (HOSPITAL_COMMUNITY)
Admission: RE | Admit: 2013-03-08 | Discharge: 2013-03-08 | Disposition: A | Discharge status: Home / Self Care | Payer: Self-pay | Source: Ambulatory Visit | Attending: Cardiovascular Disease | Admitting: Cardiovascular Disease

## 2013-03-10 ENCOUNTER — Encounter (HOSPITAL_COMMUNITY)
Admission: RE | Admit: 2013-03-10 | Discharge: 2013-03-10 | Disposition: A | Discharge status: Home / Self Care | Payer: Self-pay | Source: Ambulatory Visit | Attending: Cardiovascular Disease | Admitting: Cardiovascular Disease

## 2013-03-13 ENCOUNTER — Encounter (HOSPITAL_COMMUNITY)
Admission: RE | Admit: 2013-03-13 | Discharge: 2013-03-13 | Disposition: A | Discharge status: Home / Self Care | Payer: Self-pay | Source: Ambulatory Visit | Attending: Cardiovascular Disease | Admitting: Cardiovascular Disease

## 2013-03-14 ENCOUNTER — Telehealth: Payer: Self-pay | Admitting: Cardiovascular Disease

## 2013-03-14 NOTE — Telephone Encounter (Signed)
New Problem:    Patient called in wanting to know if she needed to have a full blood work-up prior to her appointment to see Dr. Excell Seltzer in April.

## 2013-03-14 NOTE — Telephone Encounter (Signed)
Spoke with patient she had lipid and hepatic panels 12/29/12.Patient wants to know if she needs any more lab before her 03/31/13 appointment with Dr.Cooper.Message sent to Dr.Cooper.

## 2013-03-15 ENCOUNTER — Encounter (HOSPITAL_COMMUNITY)
Admission: RE | Admit: 2013-03-15 | Discharge: 2013-03-15 | Disposition: A | Discharge status: Home / Self Care | Payer: Self-pay | Source: Ambulatory Visit | Attending: Cardiovascular Disease | Admitting: Cardiovascular Disease

## 2013-03-15 NOTE — Telephone Encounter (Signed)
No other labwork necessary before appt. thx

## 2013-03-16 NOTE — Telephone Encounter (Signed)
Spoke with patient was told Dr.Cooper advised no lab work needed before next appointment.

## 2013-03-17 ENCOUNTER — Encounter (HOSPITAL_COMMUNITY)
Admission: RE | Admit: 2013-03-17 | Discharge: 2013-03-17 | Disposition: A | Discharge status: Home / Self Care | Payer: Self-pay | Source: Ambulatory Visit | Attending: Cardiovascular Disease | Admitting: Cardiovascular Disease

## 2013-03-20 ENCOUNTER — Encounter (HOSPITAL_COMMUNITY)
Admission: RE | Admit: 2013-03-20 | Discharge: 2013-03-20 | Disposition: A | Discharge status: Home / Self Care | Payer: Self-pay | Source: Ambulatory Visit | Attending: Cardiovascular Disease | Admitting: Cardiovascular Disease

## 2013-03-20 ENCOUNTER — Telehealth: Payer: Self-pay | Admitting: Internal Medicine

## 2013-03-20 NOTE — Telephone Encounter (Signed)
If she is OK now without evidence of concussion, I think we can observe as long as asymptomatic. If has headache or other symptoms we need to see her.

## 2013-03-20 NOTE — Telephone Encounter (Signed)
Advised patient of Dr. Beryle Quant comments...patient continues to advise that she is asymptomatic.  No headache, no fever, no other neuro signs.  Advised patient if anything develops, to call back for an appointment.  Patient verbalizes understanding of instructions.

## 2013-03-22 ENCOUNTER — Encounter (HOSPITAL_COMMUNITY)
Admission: RE | Admit: 2013-03-22 | Discharge: 2013-03-22 | Disposition: A | Discharge status: Home / Self Care | Payer: Self-pay | Source: Ambulatory Visit | Attending: Cardiovascular Disease | Admitting: Cardiovascular Disease

## 2013-03-22 DIAGNOSIS — I214 Non-ST elevation (NSTEMI) myocardial infarction: Secondary | ICD-10-CM | POA: Insufficient documentation

## 2013-03-22 DIAGNOSIS — R9431 Abnormal electrocardiogram [ECG] [EKG]: Secondary | ICD-10-CM | POA: Insufficient documentation

## 2013-03-22 DIAGNOSIS — Z9861 Coronary angioplasty status: Secondary | ICD-10-CM | POA: Insufficient documentation

## 2013-03-22 DIAGNOSIS — I10 Essential (primary) hypertension: Secondary | ICD-10-CM | POA: Insufficient documentation

## 2013-03-22 DIAGNOSIS — I498 Other specified cardiac arrhythmias: Secondary | ICD-10-CM | POA: Insufficient documentation

## 2013-03-22 DIAGNOSIS — E785 Hyperlipidemia, unspecified: Secondary | ICD-10-CM | POA: Insufficient documentation

## 2013-03-22 DIAGNOSIS — Z5189 Encounter for other specified aftercare: Secondary | ICD-10-CM | POA: Insufficient documentation

## 2013-03-22 DIAGNOSIS — I2510 Atherosclerotic heart disease of native coronary artery without angina pectoris: Secondary | ICD-10-CM | POA: Insufficient documentation

## 2013-03-24 ENCOUNTER — Encounter (HOSPITAL_COMMUNITY)
Admission: RE | Admit: 2013-03-24 | Discharge: 2013-03-24 | Disposition: A | Discharge status: Home / Self Care | Payer: Self-pay | Source: Ambulatory Visit | Attending: Cardiovascular Disease | Admitting: Cardiovascular Disease

## 2013-03-24 ENCOUNTER — Other Ambulatory Visit: Payer: Self-pay

## 2013-03-24 MED ORDER — ESCITALOPRAM OXALATE 20 MG PO TABS: 20.0000 mg | ORAL_TABLET | Freq: Every day | ORAL | Status: DC

## 2013-03-24 MED ORDER — ALPRAZOLAM 0.5 MG PO TABS: 0.5000 mg | ORAL_TABLET | Freq: Two times a day (BID) | ORAL | Status: DC | PRN

## 2013-03-27 ENCOUNTER — Telehealth (HOSPITAL_COMMUNITY): Payer: Self-pay | Admitting: *Deleted

## 2013-03-27 ENCOUNTER — Encounter (HOSPITAL_COMMUNITY): Admission: RE | Admit: 2013-03-27 | Payer: Self-pay | Source: Ambulatory Visit

## 2013-03-27 ENCOUNTER — Other Ambulatory Visit: Payer: Self-pay | Admitting: Cardiovascular Disease

## 2013-03-29 ENCOUNTER — Encounter (HOSPITAL_COMMUNITY)
Admission: RE | Admit: 2013-03-29 | Discharge: 2013-03-29 | Disposition: A | Discharge status: Home / Self Care | Payer: Self-pay | Source: Ambulatory Visit | Attending: Cardiovascular Disease | Admitting: Cardiovascular Disease

## 2013-03-31 ENCOUNTER — Encounter: Payer: Self-pay | Admitting: Cardiovascular Disease

## 2013-03-31 ENCOUNTER — Ambulatory Visit (INDEPENDENT_AMBULATORY_CARE_PROVIDER_SITE_OTHER): Payer: Medicare Other | Admitting: Cardiovascular Disease

## 2013-03-31 ENCOUNTER — Encounter (HOSPITAL_COMMUNITY)
Admission: RE | Admit: 2013-03-31 | Discharge: 2013-03-31 | Disposition: A | Discharge status: Home / Self Care | Payer: Self-pay | Source: Ambulatory Visit | Attending: Cardiovascular Disease | Admitting: Cardiovascular Disease

## 2013-03-31 VITALS — BP 160/82 | HR 66 | Ht 61.5 in | Wt 154.8 lb

## 2013-03-31 DIAGNOSIS — I10 Essential (primary) hypertension: Secondary | ICD-10-CM

## 2013-03-31 DIAGNOSIS — E785 Hyperlipidemia, unspecified: Secondary | ICD-10-CM | POA: Diagnosis not present

## 2013-03-31 DIAGNOSIS — I2510 Atherosclerotic heart disease of native coronary artery without angina pectoris: Secondary | ICD-10-CM

## 2013-03-31 MED ORDER — CLOPIDOGREL BISULFATE 75 MG PO TABS: 75.0000 mg | ORAL_TABLET | Freq: Every day | ORAL | Status: DC

## 2013-03-31 MED ORDER — AMLODIPINE BESYLATE 10 MG PO TABS: 10.0000 mg | ORAL_TABLET | Freq: Every day | ORAL | Status: DC

## 2013-03-31 NOTE — Progress Notes (Signed)
HPI:  72 year old woman presenting for followup evaluation. The patient underwent multivessel stenting in April 2013 after presenting with a non-ST elevation MI. She was treated with stenting of the right coronary artery initially followed by staged PCI of the LAD and left circumflex. She was treated with drug-eluting stents. Her left ventricular function was normal with an EF of 60%. When I saw her last she describes CCS class II angina. She had a stress Myoview scan in December 2013 and this showed normal perfusion and normal LV function. Her last lipids from January 2014 showed a cholesterol of 127, triglycerides 138, HDL 57, and LDL 42.  She's doing well. She really enjoys the maintenance phase of cardiac rehabilitation. She has occasional chest pain but this is been self-limited. She has not taken any nitroglycerin. She felt very relieved after her stress test was normal. She does not have any regular exertional angina. She denies shortness of breath, edema, or palpitations. She notes that her blood pressure is frequently elevated.  Outpatient Encounter Prescriptions as of 03/31/2013  Medication Sig Dispense Refill  . acetaminophen (TYLENOL) 500 MG tablet Take 1,000 mg by mouth every 6 (six) hours as needed. For pain      . ALPRAZolam (XANAX) 0.5 MG tablet Take 1 tablet (0.5 mg total) by mouth 2 (two) times daily as needed for sleep.  60 tablet  5  . amLODipine (NORVASC) 5 MG tablet Take 1 tablet (5 mg total) by mouth daily.  30 tablet  11  . aspirin 81 MG tablet Take 1 tablet (81 mg total) by mouth daily.      Marland Kitchen atorvastatin (LIPITOR) 40 MG tablet Take 1 tablet (40 mg total) by mouth at bedtime.  1 tablet  0  . BRILINTA 90 MG TABS tablet TAKE 1 TABLET BY MOUTH TWICE DAILY  60 tablet  3  . Calcium Carbonate-Vitamin D (CALTRATE 600+D) 600-400 MG-UNIT per tablet Take 1 tablet by mouth 2 (two) times daily.       Marland Kitchen escitalopram (LEXAPRO) 20 MG tablet Take 1 tablet (20 mg total) by mouth daily.  30  tablet  11  . FOLBIC 2.5-25-2 MG TABS TAKE 1 TABLET EVERY DAY  30 tablet  11  . HYDROcodone-acetaminophen (VICODIN) 5-500 MG per tablet Take 1 tablet by mouth as needed.       Marland Kitchen KLOR-CON M20 20 MEQ tablet TAKE 1 TABLET (20 MEQ TOTAL) BY MOUTH DAILY.  30 tablet  6  . metoprolol (LOPRESSOR) 50 MG tablet Take one tablet by mouth twice a day and take an extra tablet daily as needed for elevated BP  1 tablet  0  . NEXIUM 40 MG capsule TAKE ONE CAPSULE BY MOUTH EVERY DAY  30 capsule  11  . nitroGLYCERIN (NITROSTAT) 0.4 MG SL tablet Place 1 tablet (0.4 mg total) under the tongue every 5 (five) minutes x 3 doses as needed for chest pain.  25 tablet  4  . temazepam (RESTORIL) 30 MG capsule Take 1 capsule (30 mg total) by mouth at bedtime as needed.  30 capsule  5  . [DISCONTINUED] doxycycline (VIBRA-TABS) 100 MG tablet Take 1 tablet (100 mg total) by mouth 2 (two) times daily. For infection  28 tablet  0  . [DISCONTINUED] fluconazole (DIFLUCAN) 150 MG tablet Take 1 tablet (150 mg total) by mouth once.  1 tablet  1  . [DISCONTINUED] mupirocin ointment (BACTROBAN) 2 %        No facility-administered encounter medications on file as  of 03/31/2013.    Allergies  Allergen Reactions  . Penicillins Hives and Swelling  . Sulfonamide Derivatives Hives and Swelling  . Zinc Oxide Rash    Past Medical History  Diagnosis Date  . Hypokalemia   . HTN (hypertension)     essential  . Sleep apnea   . Endometrial polyp   . CAD (coronary artery disease)     NSTEMI 03/2012 (LHC 03/27/12: pLAD 30%, mLAD 99%, mCFX 95%, mRCA 99% with thrombus, EF 65%);  s/p PTCA/DESx1 to prox-mid RCA 03/27/12 urgently in setting of hypotension/bradycardia, with staged PTCA/Evolve study stent to mid LAD & PTCA/Evolve study stent to prox LCx 03/29/12 ;   echo 03/28/12:EF 60%, Aortic sclerosis without AS, mild RVE, mild reduced RVSF    . Hyperglycemia     A1C 5.7 03/2012  . Hyperlipidemia   . Atrial tachycardia     noted at cardiac rehab 5/13;  event monitor ordered to assess for AFib    ROS: Negative except as per HPI  BP 160/82  Pulse 66  Ht 5' 1.5" (1.562 m)  Wt 70.217 kg (154 lb 12.8 oz)  BMI 28.78 kg/m2  PHYSICAL EXAM: Pt is alert and oriented, NAD HEENT: normal Neck: JVP - normal, carotids 2+= without bruits Lungs: CTA bilaterally CV: RRR without murmur or gallop Abd: soft, NT, Positive BS, no hepatomegaly Ext: no C/C/E, distal pulses intact and equal Skin: warm/dry no rash  EKG:  Normal sinus rhythm 66 beats per minute, within normal limits.  Lexiscan stress Myoview: QPS  Raw Data Images: Patient motion noted.  Stress Images: Normal homogeneous uptake in all areas of the myocardium.  Rest Images: Normal homogeneous uptake in all areas of the myocardium.  Subtraction (SDS): No evidence of ischemia.  Transient Ischemic Dilatation (Normal <1.22): 0.97  Lung/Heart Ratio (Normal <0.45): 0.19  Quantitative Gated Spect Images  QGS EDV: 64 ml  QGS ESV: 13 ml  Impression  Exercise Capacity: Fair exercise capacity.  BP Response: Hypertensive blood pressure response.  Clinical Symptoms: There is dyspnea.  ECG Impression: No significant ST segment change suggestive of ischemia.  Comparison with Prior Nuclear Study: No images to compare  Overall Impression: Normal stress nuclear study.  LV Ejection Fraction: 79%. LV Wall Motion: NL LV Function; NL Wall Motion   ASSESSMENT AND PLAN: 1. Coronary artery disease, native vessel. The patient has undergone three-vessel stenting. She is now one year out from her myocardial infarction. I've advised that she stop brilinta and start Plavix 75 mg daily. I would like her to continue on dual antiplatelet therapy for 2 years. Will likely stop her Plavix at this time next year. Her stress test result was reviewed. She will otherwise remain on a statin drug, a beta blocker, and amlodipine. I'm going to increase her amlodipine (see below).  2. Hypertension with suboptimal control.  Recommend increase amlodipine to 10 mg daily.  3. Hyperlipidemia. Lipids reviewed. She will remain on high-dose atorvastatin.  For followup of see her back in 6 months. She would like to stop K-Dur. Will do this and I advised on a potassium rich diet. Will repeat a metabolic panel in 4 weeks.  Tonny Bollman 03/31/2013 5:18 PM

## 2013-03-31 NOTE — Patient Instructions (Addendum)
Your physician has recommended you make the following change in your medication: STOP Brilinta, START Plavix 75mg  take one by mouth daily, INCREASE Amlodipine 10mg  take one by mouth daily, STOP Potassium Chloride  Your physician recommends that you return for lab work in: 1 MONTH (BMP)  Your physician wants you to follow-up in: 6 MONTHS with Dr Excell Seltzer.  You will receive a reminder letter in the mail two months in advance. If you don't receive a letter, please call our office to schedule the follow-up appointment.

## 2013-04-03 ENCOUNTER — Encounter (HOSPITAL_COMMUNITY)
Admission: RE | Admit: 2013-04-03 | Discharge: 2013-04-03 | Disposition: A | Discharge status: Home / Self Care | Payer: Self-pay | Source: Ambulatory Visit | Attending: Cardiovascular Disease | Admitting: Cardiovascular Disease

## 2013-04-05 ENCOUNTER — Encounter (HOSPITAL_COMMUNITY)
Admission: RE | Admit: 2013-04-05 | Discharge: 2013-04-05 | Disposition: A | Discharge status: Home / Self Care | Payer: Self-pay | Source: Ambulatory Visit | Attending: Cardiovascular Disease | Admitting: Cardiovascular Disease

## 2013-04-07 ENCOUNTER — Encounter (HOSPITAL_COMMUNITY)
Admission: RE | Admit: 2013-04-07 | Discharge: 2013-04-07 | Disposition: A | Discharge status: Home / Self Care | Payer: Self-pay | Source: Ambulatory Visit | Attending: Cardiovascular Disease | Admitting: Cardiovascular Disease

## 2013-04-10 ENCOUNTER — Encounter (HOSPITAL_COMMUNITY)
Admission: RE | Admit: 2013-04-10 | Discharge: 2013-04-10 | Disposition: A | Discharge status: Home / Self Care | Payer: Self-pay | Source: Ambulatory Visit | Attending: Cardiovascular Disease | Admitting: Cardiovascular Disease

## 2013-04-12 ENCOUNTER — Encounter (HOSPITAL_COMMUNITY)
Admission: RE | Admit: 2013-04-12 | Discharge: 2013-04-12 | Disposition: A | Discharge status: Home / Self Care | Payer: Self-pay | Source: Ambulatory Visit | Attending: Cardiovascular Disease | Admitting: Cardiovascular Disease

## 2013-04-14 ENCOUNTER — Telehealth: Payer: Self-pay | Admitting: Cardiovascular Disease

## 2013-04-14 ENCOUNTER — Encounter (HOSPITAL_COMMUNITY)
Admission: RE | Admit: 2013-04-14 | Discharge: 2013-04-14 | Disposition: A | Discharge status: Home / Self Care | Payer: Self-pay | Source: Ambulatory Visit | Attending: Cardiovascular Disease | Admitting: Cardiovascular Disease

## 2013-04-14 NOTE — Telephone Encounter (Signed)
Left message on machine for pt to contact the office.   

## 2013-04-14 NOTE — Telephone Encounter (Signed)
I spoke with the pt and since doubling the pt's amlodipine she has noticed lower extremity swelling, headache and increased pulse in the 70s at rest (normally in the 60's). I made her aware that increased swelling is a side effect of amlodipine.  The pt's BP has improved since increasing amlodipine to 10mg . At this time the pt would like to hold off on making further medication changes and see if her body adjusts to this dose of Amlodipine.  The pt will have labs drawn in 2 weeks and will let the office know if her symptoms have improved.

## 2013-04-14 NOTE — Telephone Encounter (Signed)
New problem     Pt has a question about a reaction she's had because of a change in medication

## 2013-04-17 ENCOUNTER — Encounter (HOSPITAL_COMMUNITY)
Admission: RE | Admit: 2013-04-17 | Discharge: 2013-04-17 | Disposition: A | Discharge status: Home / Self Care | Payer: Self-pay | Source: Ambulatory Visit | Attending: Cardiovascular Disease | Admitting: Cardiovascular Disease

## 2013-04-19 ENCOUNTER — Encounter (HOSPITAL_COMMUNITY)
Admission: RE | Admit: 2013-04-19 | Discharge: 2013-04-19 | Disposition: A | Discharge status: Home / Self Care | Payer: Self-pay | Source: Ambulatory Visit | Attending: Cardiovascular Disease | Admitting: Cardiovascular Disease

## 2013-04-21 ENCOUNTER — Encounter (HOSPITAL_COMMUNITY)
Admission: RE | Admit: 2013-04-21 | Discharge: 2013-04-21 | Disposition: A | Discharge status: Home / Self Care | Payer: Self-pay | Source: Ambulatory Visit | Attending: Cardiovascular Disease | Admitting: Cardiovascular Disease

## 2013-04-21 DIAGNOSIS — Z9861 Coronary angioplasty status: Secondary | ICD-10-CM | POA: Insufficient documentation

## 2013-04-21 DIAGNOSIS — R9431 Abnormal electrocardiogram [ECG] [EKG]: Secondary | ICD-10-CM | POA: Insufficient documentation

## 2013-04-21 DIAGNOSIS — Z5189 Encounter for other specified aftercare: Secondary | ICD-10-CM | POA: Insufficient documentation

## 2013-04-21 DIAGNOSIS — I10 Essential (primary) hypertension: Secondary | ICD-10-CM | POA: Insufficient documentation

## 2013-04-21 DIAGNOSIS — I498 Other specified cardiac arrhythmias: Secondary | ICD-10-CM | POA: Insufficient documentation

## 2013-04-21 DIAGNOSIS — I214 Non-ST elevation (NSTEMI) myocardial infarction: Secondary | ICD-10-CM | POA: Insufficient documentation

## 2013-04-21 DIAGNOSIS — E785 Hyperlipidemia, unspecified: Secondary | ICD-10-CM | POA: Insufficient documentation

## 2013-04-21 DIAGNOSIS — I2510 Atherosclerotic heart disease of native coronary artery without angina pectoris: Secondary | ICD-10-CM | POA: Insufficient documentation

## 2013-04-24 ENCOUNTER — Encounter (HOSPITAL_COMMUNITY)
Admission: RE | Admit: 2013-04-24 | Discharge: 2013-04-24 | Disposition: A | Discharge status: Home / Self Care | Payer: Self-pay | Source: Ambulatory Visit | Attending: Cardiovascular Disease | Admitting: Cardiovascular Disease

## 2013-04-26 ENCOUNTER — Encounter (HOSPITAL_COMMUNITY): Payer: Self-pay

## 2013-04-28 ENCOUNTER — Encounter (HOSPITAL_COMMUNITY)
Admission: RE | Admit: 2013-04-28 | Discharge: 2013-04-28 | Disposition: A | Discharge status: Home / Self Care | Payer: Self-pay | Source: Ambulatory Visit | Attending: Cardiovascular Disease | Admitting: Cardiovascular Disease

## 2013-04-29 ENCOUNTER — Other Ambulatory Visit: Payer: Self-pay | Admitting: Physician Assistant

## 2013-05-01 ENCOUNTER — Other Ambulatory Visit (INDEPENDENT_AMBULATORY_CARE_PROVIDER_SITE_OTHER): Payer: Medicare Other

## 2013-05-01 ENCOUNTER — Encounter (HOSPITAL_COMMUNITY)
Admission: RE | Admit: 2013-05-01 | Discharge: 2013-05-01 | Disposition: A | Discharge status: Home / Self Care | Payer: Self-pay | Source: Ambulatory Visit | Attending: Cardiovascular Disease | Admitting: Cardiovascular Disease

## 2013-05-01 DIAGNOSIS — I10 Essential (primary) hypertension: Secondary | ICD-10-CM

## 2013-05-01 DIAGNOSIS — I2510 Atherosclerotic heart disease of native coronary artery without angina pectoris: Secondary | ICD-10-CM | POA: Diagnosis not present

## 2013-05-01 DIAGNOSIS — E785 Hyperlipidemia, unspecified: Secondary | ICD-10-CM | POA: Diagnosis not present

## 2013-05-01 LAB — BASIC METABOLIC PANEL
BUN: 13 mg/dL (ref 6–23)
Chloride: 100 mEq/L (ref 96–112)
GFR: 84.47 mL/min (ref >60.00)
Potassium: 4.1 mEq/L (ref 3.5–5.1)
Sodium: 137 mEq/L (ref 135–145)

## 2013-05-03 ENCOUNTER — Encounter (HOSPITAL_COMMUNITY)
Admission: RE | Admit: 2013-05-03 | Discharge: 2013-05-03 | Disposition: A | Discharge status: Home / Self Care | Payer: Self-pay | Source: Ambulatory Visit | Attending: Cardiovascular Disease | Admitting: Cardiovascular Disease

## 2013-05-03 ENCOUNTER — Telehealth: Payer: Self-pay | Admitting: Cardiovascular Disease

## 2013-05-03 NOTE — Telephone Encounter (Signed)
New problem:  Calling for test results.  

## 2013-05-03 NOTE — Telephone Encounter (Signed)
**Note De-Identified  Obfuscation** No answer and no way to leave message. Phone rings 5 times then gives a busy signal.

## 2013-05-04 ENCOUNTER — Other Ambulatory Visit: Payer: Self-pay | Admitting: Internal Medicine

## 2013-05-04 NOTE — Telephone Encounter (Signed)
Follow-up:    Patient called in returning Lynn's call.  Please call back.

## 2013-05-04 NOTE — Telephone Encounter (Signed)
Pt notified of lab results

## 2013-05-05 ENCOUNTER — Encounter (HOSPITAL_COMMUNITY): Payer: Self-pay

## 2013-05-08 ENCOUNTER — Encounter (HOSPITAL_COMMUNITY)
Admission: RE | Admit: 2013-05-08 | Discharge: 2013-05-08 | Disposition: A | Discharge status: Home / Self Care | Payer: Self-pay | Source: Ambulatory Visit | Attending: Cardiovascular Disease | Admitting: Cardiovascular Disease

## 2013-05-10 ENCOUNTER — Encounter (HOSPITAL_COMMUNITY)
Admission: RE | Admit: 2013-05-10 | Discharge: 2013-05-10 | Disposition: A | Discharge status: Home / Self Care | Payer: Self-pay | Source: Ambulatory Visit | Attending: Cardiovascular Disease | Admitting: Cardiovascular Disease

## 2013-05-12 ENCOUNTER — Encounter (HOSPITAL_COMMUNITY)
Admission: RE | Admit: 2013-05-12 | Discharge: 2013-05-12 | Disposition: A | Discharge status: Home / Self Care | Payer: Self-pay | Source: Ambulatory Visit | Attending: Cardiovascular Disease | Admitting: Cardiovascular Disease

## 2013-05-17 ENCOUNTER — Encounter (HOSPITAL_COMMUNITY)
Admission: RE | Admit: 2013-05-17 | Discharge: 2013-05-17 | Disposition: A | Discharge status: Home / Self Care | Payer: Self-pay | Source: Ambulatory Visit | Attending: Cardiovascular Disease | Admitting: Cardiovascular Disease

## 2013-05-19 ENCOUNTER — Encounter (HOSPITAL_COMMUNITY)
Admission: RE | Admit: 2013-05-19 | Discharge: 2013-05-19 | Disposition: A | Discharge status: Home / Self Care | Payer: Self-pay | Source: Ambulatory Visit | Attending: Cardiovascular Disease | Admitting: Cardiovascular Disease

## 2013-05-22 ENCOUNTER — Encounter (HOSPITAL_COMMUNITY)
Admission: RE | Admit: 2013-05-22 | Discharge: 2013-05-22 | Disposition: A | Discharge status: Home / Self Care | Payer: Self-pay | Source: Ambulatory Visit | Attending: Cardiovascular Disease | Admitting: Cardiovascular Disease

## 2013-05-22 DIAGNOSIS — E785 Hyperlipidemia, unspecified: Secondary | ICD-10-CM | POA: Insufficient documentation

## 2013-05-22 DIAGNOSIS — I498 Other specified cardiac arrhythmias: Secondary | ICD-10-CM | POA: Insufficient documentation

## 2013-05-22 DIAGNOSIS — Z9861 Coronary angioplasty status: Secondary | ICD-10-CM | POA: Insufficient documentation

## 2013-05-22 DIAGNOSIS — Z5189 Encounter for other specified aftercare: Secondary | ICD-10-CM | POA: Insufficient documentation

## 2013-05-22 DIAGNOSIS — R9431 Abnormal electrocardiogram [ECG] [EKG]: Secondary | ICD-10-CM | POA: Insufficient documentation

## 2013-05-22 DIAGNOSIS — I214 Non-ST elevation (NSTEMI) myocardial infarction: Secondary | ICD-10-CM | POA: Insufficient documentation

## 2013-05-22 DIAGNOSIS — I10 Essential (primary) hypertension: Secondary | ICD-10-CM | POA: Insufficient documentation

## 2013-05-22 DIAGNOSIS — I2510 Atherosclerotic heart disease of native coronary artery without angina pectoris: Secondary | ICD-10-CM | POA: Insufficient documentation

## 2013-05-24 ENCOUNTER — Encounter (HOSPITAL_COMMUNITY)
Admission: RE | Admit: 2013-05-24 | Discharge: 2013-05-24 | Disposition: A | Discharge status: Home / Self Care | Payer: Self-pay | Source: Ambulatory Visit | Attending: Cardiovascular Disease | Admitting: Cardiovascular Disease

## 2013-05-26 ENCOUNTER — Encounter (HOSPITAL_COMMUNITY)
Admission: RE | Admit: 2013-05-26 | Discharge: 2013-05-26 | Disposition: A | Discharge status: Home / Self Care | Payer: Self-pay | Source: Ambulatory Visit | Attending: Cardiovascular Disease | Admitting: Cardiovascular Disease

## 2013-05-29 ENCOUNTER — Encounter (HOSPITAL_COMMUNITY)
Admission: RE | Admit: 2013-05-29 | Discharge: 2013-05-29 | Disposition: A | Discharge status: Home / Self Care | Payer: Self-pay | Source: Ambulatory Visit | Attending: Cardiovascular Disease | Admitting: Cardiovascular Disease

## 2013-05-29 ENCOUNTER — Telehealth: Payer: Self-pay | Admitting: Cardiovascular Disease

## 2013-05-29 DIAGNOSIS — I10 Essential (primary) hypertension: Secondary | ICD-10-CM

## 2013-05-29 DIAGNOSIS — I2510 Atherosclerotic heart disease of native coronary artery without angina pectoris: Secondary | ICD-10-CM

## 2013-05-29 DIAGNOSIS — R000 Tachycardia, unspecified: Secondary | ICD-10-CM

## 2013-05-29 DIAGNOSIS — E785 Hyperlipidemia, unspecified: Secondary | ICD-10-CM

## 2013-05-29 MED ORDER — ATORVASTATIN CALCIUM 40 MG PO TABS: 40.0000 mg | ORAL_TABLET | Freq: Every day | ORAL | Status: DC

## 2013-05-29 NOTE — Telephone Encounter (Signed)
Follow up   Pt has another problem concerning the Norvasc she is having weight gain and swelling. Please call pt concerning this matter

## 2013-05-29 NOTE — Telephone Encounter (Signed)
The pt's weight is up 10 pounds since the beginning of the year. She feels like 8 of the 10 pounds have been since April when Norvasc was increased. The pt's BP has been 110-125/70 since taking Norvasc and she is pleased with BP results. The pt also continues to have problems with lower extremity swelling and fatigue. The pt would like for Dr Excell Seltzer to recommend medication changes that will not have as many side effects but continue with excellent BP control. I will forward this message to Dr Excell Seltzer for review.

## 2013-05-29 NOTE — Telephone Encounter (Signed)
New Problem  Pt states she has a prescription for Lipitor that was prescribed by the hospital. She wants to know if Dr Excell Seltzer would be willing to prescribe this medication for her, since the hospital can not renew the prescription.

## 2013-05-31 ENCOUNTER — Encounter (HOSPITAL_COMMUNITY)
Admission: RE | Admit: 2013-05-31 | Discharge: 2013-05-31 | Disposition: A | Discharge status: Home / Self Care | Payer: Self-pay | Source: Ambulatory Visit | Attending: Cardiovascular Disease | Admitting: Cardiovascular Disease

## 2013-05-31 MED ORDER — HYDROCHLOROTHIAZIDE 25 MG PO TABS: 12.5000 mg | ORAL_TABLET | Freq: Every day | ORAL | Status: DC

## 2013-05-31 NOTE — Telephone Encounter (Signed)
Pt aware of medication changes. The pt will have BMP checked on 06/14/13. Rx sent to pharmacy for HCTZ only, the pt will cut Amlodipine 10mg  in half currently.

## 2013-05-31 NOTE — Telephone Encounter (Signed)
Decrease amlodipine to 5 mg (previous dose), add HCTZ 12.5 mg daily. BMET 2 weeks. thx

## 2013-06-02 ENCOUNTER — Encounter (HOSPITAL_COMMUNITY)
Admission: RE | Admit: 2013-06-02 | Discharge: 2013-06-02 | Disposition: A | Discharge status: Home / Self Care | Payer: Self-pay | Source: Ambulatory Visit | Attending: Cardiovascular Disease | Admitting: Cardiovascular Disease

## 2013-06-05 ENCOUNTER — Encounter (HOSPITAL_COMMUNITY)
Admission: RE | Admit: 2013-06-05 | Discharge: 2013-06-05 | Disposition: A | Discharge status: Home / Self Care | Payer: Self-pay | Source: Ambulatory Visit | Attending: Cardiovascular Disease | Admitting: Cardiovascular Disease

## 2013-06-07 ENCOUNTER — Encounter (HOSPITAL_COMMUNITY)
Admission: RE | Admit: 2013-06-07 | Discharge: 2013-06-07 | Disposition: A | Discharge status: Home / Self Care | Payer: Self-pay | Source: Ambulatory Visit | Attending: Cardiovascular Disease | Admitting: Cardiovascular Disease

## 2013-06-09 ENCOUNTER — Encounter (HOSPITAL_COMMUNITY)
Admission: RE | Admit: 2013-06-09 | Discharge: 2013-06-09 | Disposition: A | Discharge status: Home / Self Care | Payer: Self-pay | Source: Ambulatory Visit | Attending: Cardiovascular Disease | Admitting: Cardiovascular Disease

## 2013-06-12 ENCOUNTER — Encounter (HOSPITAL_COMMUNITY)
Admission: RE | Admit: 2013-06-12 | Discharge: 2013-06-12 | Disposition: A | Discharge status: Home / Self Care | Payer: Self-pay | Source: Ambulatory Visit | Attending: Cardiovascular Disease | Admitting: Cardiovascular Disease

## 2013-06-14 ENCOUNTER — Other Ambulatory Visit (INDEPENDENT_AMBULATORY_CARE_PROVIDER_SITE_OTHER): Payer: Medicare Other

## 2013-06-14 ENCOUNTER — Encounter (HOSPITAL_COMMUNITY)
Admission: RE | Admit: 2013-06-14 | Discharge: 2013-06-14 | Disposition: A | Discharge status: Home / Self Care | Payer: Self-pay | Source: Ambulatory Visit | Attending: Cardiovascular Disease | Admitting: Cardiovascular Disease

## 2013-06-14 DIAGNOSIS — I2510 Atherosclerotic heart disease of native coronary artery without angina pectoris: Secondary | ICD-10-CM | POA: Diagnosis not present

## 2013-06-14 DIAGNOSIS — E785 Hyperlipidemia, unspecified: Secondary | ICD-10-CM

## 2013-06-14 DIAGNOSIS — I10 Essential (primary) hypertension: Secondary | ICD-10-CM | POA: Diagnosis not present

## 2013-06-14 LAB — BASIC METABOLIC PANEL
CO2: 28 mEq/L (ref 19–32)
Calcium: 9.9 mg/dL (ref 8.4–10.5)
Glucose, Bld: 97 mg/dL (ref 70–99)
Potassium: 5 mEq/L (ref 3.5–5.1)
Sodium: 136 mEq/L (ref 135–145)

## 2013-06-16 ENCOUNTER — Encounter (HOSPITAL_COMMUNITY)
Admission: RE | Admit: 2013-06-16 | Discharge: 2013-06-16 | Disposition: A | Discharge status: Home / Self Care | Payer: Self-pay | Source: Ambulatory Visit | Attending: Cardiovascular Disease | Admitting: Cardiovascular Disease

## 2013-06-19 ENCOUNTER — Encounter (HOSPITAL_COMMUNITY)
Admission: RE | Admit: 2013-06-19 | Discharge: 2013-06-19 | Disposition: A | Discharge status: Home / Self Care | Payer: Self-pay | Source: Ambulatory Visit | Attending: Cardiovascular Disease | Admitting: Cardiovascular Disease

## 2013-06-21 ENCOUNTER — Encounter (HOSPITAL_COMMUNITY)
Admission: RE | Admit: 2013-06-21 | Discharge: 2013-06-21 | Disposition: A | Discharge status: Home / Self Care | Payer: Self-pay | Source: Ambulatory Visit | Attending: Cardiovascular Disease | Admitting: Cardiovascular Disease

## 2013-06-21 DIAGNOSIS — I10 Essential (primary) hypertension: Secondary | ICD-10-CM | POA: Insufficient documentation

## 2013-06-21 DIAGNOSIS — E785 Hyperlipidemia, unspecified: Secondary | ICD-10-CM | POA: Insufficient documentation

## 2013-06-21 DIAGNOSIS — R9431 Abnormal electrocardiogram [ECG] [EKG]: Secondary | ICD-10-CM | POA: Insufficient documentation

## 2013-06-21 DIAGNOSIS — I214 Non-ST elevation (NSTEMI) myocardial infarction: Secondary | ICD-10-CM | POA: Insufficient documentation

## 2013-06-21 DIAGNOSIS — I498 Other specified cardiac arrhythmias: Secondary | ICD-10-CM | POA: Insufficient documentation

## 2013-06-21 DIAGNOSIS — Z9861 Coronary angioplasty status: Secondary | ICD-10-CM | POA: Insufficient documentation

## 2013-06-21 DIAGNOSIS — I2510 Atherosclerotic heart disease of native coronary artery without angina pectoris: Secondary | ICD-10-CM | POA: Insufficient documentation

## 2013-06-21 DIAGNOSIS — Z5189 Encounter for other specified aftercare: Secondary | ICD-10-CM | POA: Insufficient documentation

## 2013-06-22 ENCOUNTER — Other Ambulatory Visit: Payer: Self-pay

## 2013-06-22 MED ORDER — TEMAZEPAM 30 MG PO CAPS: 30.0000 mg | ORAL_CAPSULE | Freq: Every evening | ORAL | Status: DC | PRN

## 2013-06-26 ENCOUNTER — Encounter (HOSPITAL_COMMUNITY)
Admission: RE | Admit: 2013-06-26 | Discharge: 2013-06-26 | Disposition: A | Discharge status: Home / Self Care | Payer: Self-pay | Source: Ambulatory Visit | Attending: Cardiovascular Disease | Admitting: Cardiovascular Disease

## 2013-06-28 ENCOUNTER — Encounter (HOSPITAL_COMMUNITY)
Admission: RE | Admit: 2013-06-28 | Discharge: 2013-06-28 | Disposition: A | Discharge status: Home / Self Care | Payer: Self-pay | Source: Ambulatory Visit | Attending: Cardiovascular Disease | Admitting: Cardiovascular Disease

## 2013-06-30 ENCOUNTER — Encounter (HOSPITAL_COMMUNITY)
Admission: RE | Admit: 2013-06-30 | Discharge: 2013-06-30 | Disposition: A | Discharge status: Home / Self Care | Payer: Self-pay | Source: Ambulatory Visit | Attending: Cardiovascular Disease | Admitting: Cardiovascular Disease

## 2013-07-03 ENCOUNTER — Encounter (HOSPITAL_COMMUNITY): Payer: Self-pay

## 2013-07-03 ENCOUNTER — Other Ambulatory Visit: Payer: Self-pay | Admitting: Internal Medicine

## 2013-07-05 ENCOUNTER — Encounter (HOSPITAL_COMMUNITY)
Admission: RE | Admit: 2013-07-05 | Discharge: 2013-07-05 | Disposition: A | Discharge status: Home / Self Care | Payer: Self-pay | Source: Ambulatory Visit | Attending: Cardiovascular Disease | Admitting: Cardiovascular Disease

## 2013-07-07 ENCOUNTER — Encounter (HOSPITAL_COMMUNITY): Payer: Self-pay

## 2013-07-10 ENCOUNTER — Encounter (HOSPITAL_COMMUNITY)
Admission: RE | Admit: 2013-07-10 | Discharge: 2013-07-10 | Disposition: A | Discharge status: Home / Self Care | Payer: Self-pay | Source: Ambulatory Visit | Attending: Cardiovascular Disease | Admitting: Cardiovascular Disease

## 2013-07-12 ENCOUNTER — Encounter (HOSPITAL_COMMUNITY)
Admission: RE | Admit: 2013-07-12 | Discharge: 2013-07-12 | Disposition: A | Discharge status: Home / Self Care | Payer: Self-pay | Source: Ambulatory Visit | Attending: Cardiovascular Disease | Admitting: Cardiovascular Disease

## 2013-07-14 ENCOUNTER — Encounter (HOSPITAL_COMMUNITY)
Admission: RE | Admit: 2013-07-14 | Discharge: 2013-07-14 | Disposition: A | Discharge status: Home / Self Care | Payer: Self-pay | Source: Ambulatory Visit | Attending: Cardiovascular Disease | Admitting: Cardiovascular Disease

## 2013-07-17 ENCOUNTER — Encounter (HOSPITAL_COMMUNITY)
Admission: RE | Admit: 2013-07-17 | Discharge: 2013-07-17 | Disposition: A | Discharge status: Home / Self Care | Payer: Self-pay | Source: Ambulatory Visit | Attending: Cardiovascular Disease | Admitting: Cardiovascular Disease

## 2013-07-19 ENCOUNTER — Encounter (HOSPITAL_COMMUNITY)
Admission: RE | Admit: 2013-07-19 | Discharge: 2013-07-19 | Disposition: A | Discharge status: Home / Self Care | Payer: Self-pay | Source: Ambulatory Visit | Attending: Cardiovascular Disease | Admitting: Cardiovascular Disease

## 2013-07-21 ENCOUNTER — Encounter (HOSPITAL_COMMUNITY)
Admission: RE | Admit: 2013-07-21 | Discharge: 2013-07-21 | Disposition: A | Discharge status: Home / Self Care | Payer: Self-pay | Source: Ambulatory Visit | Attending: Cardiovascular Disease | Admitting: Cardiovascular Disease

## 2013-07-21 DIAGNOSIS — E785 Hyperlipidemia, unspecified: Secondary | ICD-10-CM | POA: Insufficient documentation

## 2013-07-21 DIAGNOSIS — Z5189 Encounter for other specified aftercare: Secondary | ICD-10-CM | POA: Insufficient documentation

## 2013-07-21 DIAGNOSIS — I2510 Atherosclerotic heart disease of native coronary artery without angina pectoris: Secondary | ICD-10-CM | POA: Insufficient documentation

## 2013-07-21 DIAGNOSIS — R9431 Abnormal electrocardiogram [ECG] [EKG]: Secondary | ICD-10-CM | POA: Insufficient documentation

## 2013-07-21 DIAGNOSIS — I10 Essential (primary) hypertension: Secondary | ICD-10-CM | POA: Insufficient documentation

## 2013-07-21 DIAGNOSIS — I498 Other specified cardiac arrhythmias: Secondary | ICD-10-CM | POA: Insufficient documentation

## 2013-07-21 DIAGNOSIS — I214 Non-ST elevation (NSTEMI) myocardial infarction: Secondary | ICD-10-CM | POA: Insufficient documentation

## 2013-07-21 DIAGNOSIS — Z9861 Coronary angioplasty status: Secondary | ICD-10-CM | POA: Insufficient documentation

## 2013-07-24 ENCOUNTER — Encounter (HOSPITAL_COMMUNITY)
Admission: RE | Admit: 2013-07-24 | Discharge: 2013-07-24 | Disposition: A | Discharge status: Home / Self Care | Payer: Self-pay | Source: Ambulatory Visit | Attending: Cardiovascular Disease | Admitting: Cardiovascular Disease

## 2013-07-26 ENCOUNTER — Encounter (HOSPITAL_COMMUNITY)
Admission: RE | Admit: 2013-07-26 | Discharge: 2013-07-26 | Disposition: A | Discharge status: Home / Self Care | Payer: Self-pay | Source: Ambulatory Visit | Attending: Cardiovascular Disease | Admitting: Cardiovascular Disease

## 2013-07-28 ENCOUNTER — Encounter (HOSPITAL_COMMUNITY)
Admission: RE | Admit: 2013-07-28 | Discharge: 2013-07-28 | Disposition: A | Discharge status: Home / Self Care | Payer: Self-pay | Source: Ambulatory Visit | Attending: Cardiovascular Disease | Admitting: Cardiovascular Disease

## 2013-07-28 DIAGNOSIS — H524 Presbyopia: Secondary | ICD-10-CM | POA: Diagnosis not present

## 2013-07-28 DIAGNOSIS — H2510 Age-related nuclear cataract, unspecified eye: Secondary | ICD-10-CM | POA: Diagnosis not present

## 2013-07-28 DIAGNOSIS — H52209 Unspecified astigmatism, unspecified eye: Secondary | ICD-10-CM | POA: Diagnosis not present

## 2013-07-31 ENCOUNTER — Encounter (HOSPITAL_COMMUNITY)
Admission: RE | Admit: 2013-07-31 | Discharge: 2013-07-31 | Disposition: A | Discharge status: Home / Self Care | Payer: Self-pay | Source: Ambulatory Visit | Attending: Cardiovascular Disease | Admitting: Cardiovascular Disease

## 2013-08-02 ENCOUNTER — Encounter (HOSPITAL_COMMUNITY)
Admission: RE | Admit: 2013-08-02 | Discharge: 2013-08-02 | Disposition: A | Discharge status: Home / Self Care | Payer: Self-pay | Source: Ambulatory Visit | Attending: Cardiovascular Disease | Admitting: Cardiovascular Disease

## 2013-08-04 ENCOUNTER — Encounter (HOSPITAL_COMMUNITY): Payer: Self-pay

## 2013-08-07 ENCOUNTER — Encounter (HOSPITAL_COMMUNITY)
Admission: RE | Admit: 2013-08-07 | Discharge: 2013-08-07 | Disposition: A | Discharge status: Home / Self Care | Payer: Self-pay | Source: Ambulatory Visit | Attending: Cardiovascular Disease | Admitting: Cardiovascular Disease

## 2013-08-09 ENCOUNTER — Encounter (HOSPITAL_COMMUNITY)
Admission: RE | Admit: 2013-08-09 | Discharge: 2013-08-09 | Disposition: A | Discharge status: Home / Self Care | Payer: Self-pay | Source: Ambulatory Visit | Attending: Cardiovascular Disease | Admitting: Cardiovascular Disease

## 2013-08-11 ENCOUNTER — Encounter (HOSPITAL_COMMUNITY): Payer: Self-pay

## 2013-08-14 ENCOUNTER — Encounter (HOSPITAL_COMMUNITY)
Admission: RE | Admit: 2013-08-14 | Discharge: 2013-08-14 | Disposition: A | Discharge status: Home / Self Care | Payer: Self-pay | Source: Ambulatory Visit | Attending: Cardiovascular Disease | Admitting: Cardiovascular Disease

## 2013-08-16 ENCOUNTER — Encounter (HOSPITAL_COMMUNITY)
Admission: RE | Admit: 2013-08-16 | Discharge: 2013-08-16 | Disposition: A | Discharge status: Home / Self Care | Payer: Self-pay | Source: Ambulatory Visit | Attending: Cardiovascular Disease | Admitting: Cardiovascular Disease

## 2013-08-18 ENCOUNTER — Encounter (HOSPITAL_COMMUNITY)
Admission: RE | Admit: 2013-08-18 | Discharge: 2013-08-18 | Disposition: A | Discharge status: Home / Self Care | Payer: Self-pay | Source: Ambulatory Visit | Attending: Cardiovascular Disease | Admitting: Cardiovascular Disease

## 2013-08-18 ENCOUNTER — Ambulatory Visit: Payer: Self-pay | Admitting: Obstetrics & Gynecology

## 2013-08-23 ENCOUNTER — Encounter (HOSPITAL_COMMUNITY)
Admission: RE | Admit: 2013-08-23 | Discharge: 2013-08-23 | Disposition: A | Discharge status: Home / Self Care | Payer: Medicare Other | Source: Ambulatory Visit | Attending: Cardiovascular Disease | Admitting: Cardiovascular Disease

## 2013-08-23 DIAGNOSIS — I498 Other specified cardiac arrhythmias: Secondary | ICD-10-CM | POA: Insufficient documentation

## 2013-08-23 DIAGNOSIS — I10 Essential (primary) hypertension: Secondary | ICD-10-CM | POA: Insufficient documentation

## 2013-08-23 DIAGNOSIS — R9431 Abnormal electrocardiogram [ECG] [EKG]: Secondary | ICD-10-CM | POA: Insufficient documentation

## 2013-08-23 DIAGNOSIS — I214 Non-ST elevation (NSTEMI) myocardial infarction: Secondary | ICD-10-CM | POA: Insufficient documentation

## 2013-08-23 DIAGNOSIS — Z9861 Coronary angioplasty status: Secondary | ICD-10-CM | POA: Insufficient documentation

## 2013-08-23 DIAGNOSIS — I2510 Atherosclerotic heart disease of native coronary artery without angina pectoris: Secondary | ICD-10-CM | POA: Insufficient documentation

## 2013-08-23 DIAGNOSIS — E785 Hyperlipidemia, unspecified: Secondary | ICD-10-CM | POA: Insufficient documentation

## 2013-08-23 DIAGNOSIS — Z5189 Encounter for other specified aftercare: Secondary | ICD-10-CM | POA: Insufficient documentation

## 2013-08-24 ENCOUNTER — Telehealth: Payer: Self-pay | Admitting: Cardiovascular Disease

## 2013-08-24 NOTE — Telephone Encounter (Signed)
Pt needs nitro refilled at [facility] [phone], was prescribed at hospital, going [place] the [place] and would like to take med with her and is out

## 2013-08-25 ENCOUNTER — Encounter (HOSPITAL_COMMUNITY)
Admission: RE | Admit: 2013-08-25 | Discharge: 2013-08-25 | Disposition: A | Discharge status: Home / Self Care | Payer: Self-pay | Source: Ambulatory Visit | Attending: Cardiovascular Disease | Admitting: Cardiovascular Disease

## 2013-08-28 ENCOUNTER — Encounter (HOSPITAL_COMMUNITY)
Admission: RE | Admit: 2013-08-28 | Discharge: 2013-08-28 | Disposition: A | Discharge status: Home / Self Care | Payer: Self-pay | Source: Ambulatory Visit | Attending: Cardiovascular Disease | Admitting: Cardiovascular Disease

## 2013-08-28 MED ORDER — NITROGLYCERIN 0.4 MG SL SUBL: 0.4000 mg | SUBLINGUAL_TABLET | SUBLINGUAL | Status: DC | PRN

## 2013-08-30 ENCOUNTER — Encounter (HOSPITAL_COMMUNITY)
Admission: RE | Admit: 2013-08-30 | Discharge: 2013-08-30 | Disposition: A | Discharge status: Home / Self Care | Payer: Self-pay | Source: Ambulatory Visit | Attending: Cardiovascular Disease | Admitting: Cardiovascular Disease

## 2013-09-01 ENCOUNTER — Encounter (HOSPITAL_COMMUNITY)
Admission: RE | Admit: 2013-09-01 | Discharge: 2013-09-01 | Disposition: A | Discharge status: Home / Self Care | Payer: Self-pay | Source: Ambulatory Visit | Attending: Cardiovascular Disease | Admitting: Cardiovascular Disease

## 2013-09-04 ENCOUNTER — Encounter (HOSPITAL_COMMUNITY)
Admission: RE | Admit: 2013-09-04 | Discharge: 2013-09-04 | Disposition: A | Discharge status: Home / Self Care | Payer: Self-pay | Source: Ambulatory Visit | Attending: Cardiovascular Disease | Admitting: Cardiovascular Disease

## 2013-09-04 ENCOUNTER — Other Ambulatory Visit: Payer: Self-pay

## 2013-09-04 MED ORDER — HYDROCODONE-ACETAMINOPHEN 5-325 MG PO TABS: 1.0000 | ORAL_TABLET | Freq: Two times a day (BID) | ORAL | Status: DC | PRN

## 2013-09-06 ENCOUNTER — Telehealth: Payer: Self-pay | Admitting: Cardiovascular Disease

## 2013-09-06 ENCOUNTER — Other Ambulatory Visit (INDEPENDENT_AMBULATORY_CARE_PROVIDER_SITE_OTHER): Payer: Medicare Other

## 2013-09-06 ENCOUNTER — Encounter (HOSPITAL_COMMUNITY): Payer: Medicare Other

## 2013-09-06 DIAGNOSIS — I10 Essential (primary) hypertension: Secondary | ICD-10-CM

## 2013-09-06 LAB — BASIC METABOLIC PANEL
CO2: 31 mEq/L (ref 19–32)
Calcium: 9.2 mg/dL (ref 8.4–10.5)
Chloride: 100 mEq/L (ref 96–112)
Creatinine, Ser: 0.8 mg/dL (ref 0.4–1.2)
Glucose, Bld: 106 mg/dL — ABNORMAL HIGH (ref 70–99)
Sodium: 137 mEq/L (ref 135–145)

## 2013-09-06 NOTE — Telephone Encounter (Signed)
Patient is going to [place] next week.  She has been experiencing leg cramps and wonders if she should have her K+ checked again.

## 2013-09-06 NOTE — Telephone Encounter (Signed)
I spoke with the pt and she has started having leg cramps and would like to have her potassium checked since she is taking a diuretic.  BMP order placed and the pt will come into the office today for blood work.

## 2013-09-08 ENCOUNTER — Encounter (HOSPITAL_COMMUNITY)
Admission: RE | Admit: 2013-09-08 | Discharge: 2013-09-08 | Disposition: A | Discharge status: Home / Self Care | Payer: Self-pay | Source: Ambulatory Visit | Attending: Cardiovascular Disease | Admitting: Cardiovascular Disease

## 2013-09-09 DIAGNOSIS — Z23 Encounter for immunization: Secondary | ICD-10-CM | POA: Diagnosis not present

## 2013-09-11 ENCOUNTER — Encounter (HOSPITAL_COMMUNITY)
Admission: RE | Admit: 2013-09-11 | Discharge: 2013-09-11 | Disposition: A | Discharge status: Home / Self Care | Payer: Self-pay | Source: Ambulatory Visit | Attending: Cardiovascular Disease | Admitting: Cardiovascular Disease

## 2013-09-13 ENCOUNTER — Encounter (HOSPITAL_COMMUNITY)
Admission: RE | Admit: 2013-09-13 | Discharge: 2013-09-13 | Disposition: A | Discharge status: Home / Self Care | Payer: Self-pay | Source: Ambulatory Visit | Attending: Cardiovascular Disease | Admitting: Cardiovascular Disease

## 2013-09-15 ENCOUNTER — Encounter (HOSPITAL_COMMUNITY): Payer: Medicare Other

## 2013-09-18 ENCOUNTER — Encounter (HOSPITAL_COMMUNITY): Payer: Medicare Other

## 2013-09-20 ENCOUNTER — Encounter (HOSPITAL_COMMUNITY): Payer: Medicare Other

## 2013-09-20 DIAGNOSIS — E785 Hyperlipidemia, unspecified: Secondary | ICD-10-CM | POA: Insufficient documentation

## 2013-09-20 DIAGNOSIS — I10 Essential (primary) hypertension: Secondary | ICD-10-CM | POA: Insufficient documentation

## 2013-09-20 DIAGNOSIS — R9431 Abnormal electrocardiogram [ECG] [EKG]: Secondary | ICD-10-CM | POA: Insufficient documentation

## 2013-09-20 DIAGNOSIS — I214 Non-ST elevation (NSTEMI) myocardial infarction: Secondary | ICD-10-CM | POA: Insufficient documentation

## 2013-09-20 DIAGNOSIS — Z9861 Coronary angioplasty status: Secondary | ICD-10-CM | POA: Insufficient documentation

## 2013-09-20 DIAGNOSIS — Z5189 Encounter for other specified aftercare: Secondary | ICD-10-CM | POA: Insufficient documentation

## 2013-09-20 DIAGNOSIS — I2510 Atherosclerotic heart disease of native coronary artery without angina pectoris: Secondary | ICD-10-CM | POA: Insufficient documentation

## 2013-09-20 DIAGNOSIS — I498 Other specified cardiac arrhythmias: Secondary | ICD-10-CM | POA: Insufficient documentation

## 2013-09-22 ENCOUNTER — Encounter (HOSPITAL_COMMUNITY): Payer: Medicare Other

## 2013-09-25 ENCOUNTER — Encounter (HOSPITAL_COMMUNITY): Payer: Medicare Other

## 2013-09-27 ENCOUNTER — Encounter (HOSPITAL_COMMUNITY): Payer: Medicare Other

## 2013-09-29 ENCOUNTER — Encounter (HOSPITAL_COMMUNITY): Payer: Medicare Other

## 2013-10-02 ENCOUNTER — Encounter (HOSPITAL_COMMUNITY)
Admission: RE | Admit: 2013-10-02 | Discharge: 2013-10-02 | Disposition: A | Discharge status: Home / Self Care | Payer: Self-pay | Source: Ambulatory Visit | Attending: Cardiovascular Disease | Admitting: Cardiovascular Disease

## 2013-10-04 ENCOUNTER — Encounter (HOSPITAL_COMMUNITY)
Admission: RE | Admit: 2013-10-04 | Discharge: 2013-10-04 | Disposition: A | Discharge status: Home / Self Care | Payer: Self-pay | Source: Ambulatory Visit | Attending: Cardiovascular Disease | Admitting: Cardiovascular Disease

## 2013-10-05 DIAGNOSIS — H18839 Recurrent erosion of cornea, unspecified eye: Secondary | ICD-10-CM | POA: Diagnosis not present

## 2013-10-06 ENCOUNTER — Encounter (HOSPITAL_COMMUNITY): Payer: Medicare Other

## 2013-10-09 ENCOUNTER — Encounter (HOSPITAL_COMMUNITY)
Admission: RE | Admit: 2013-10-09 | Discharge: 2013-10-09 | Disposition: A | Discharge status: Home / Self Care | Payer: Self-pay | Source: Ambulatory Visit | Attending: Cardiovascular Disease | Admitting: Cardiovascular Disease

## 2013-10-11 ENCOUNTER — Encounter (HOSPITAL_COMMUNITY)
Admission: RE | Admit: 2013-10-11 | Discharge: 2013-10-11 | Disposition: A | Discharge status: Home / Self Care | Payer: Self-pay | Source: Ambulatory Visit | Attending: Cardiovascular Disease | Admitting: Cardiovascular Disease

## 2013-10-13 ENCOUNTER — Encounter (HOSPITAL_COMMUNITY)
Admission: RE | Admit: 2013-10-13 | Discharge: 2013-10-13 | Disposition: A | Discharge status: Home / Self Care | Payer: Self-pay | Source: Ambulatory Visit | Attending: Cardiovascular Disease | Admitting: Cardiovascular Disease

## 2013-10-16 ENCOUNTER — Encounter (HOSPITAL_COMMUNITY)
Admission: RE | Admit: 2013-10-16 | Discharge: 2013-10-16 | Disposition: A | Discharge status: Home / Self Care | Payer: Self-pay | Source: Ambulatory Visit | Attending: Cardiovascular Disease | Admitting: Cardiovascular Disease

## 2013-10-18 ENCOUNTER — Encounter: Payer: Self-pay | Admitting: Cardiovascular Disease

## 2013-10-18 ENCOUNTER — Ambulatory Visit (INDEPENDENT_AMBULATORY_CARE_PROVIDER_SITE_OTHER): Payer: Medicare Other | Admitting: Cardiovascular Disease

## 2013-10-18 ENCOUNTER — Encounter (HOSPITAL_COMMUNITY)
Admission: RE | Admit: 2013-10-18 | Discharge: 2013-10-18 | Disposition: A | Discharge status: Home / Self Care | Payer: Self-pay | Source: Ambulatory Visit | Attending: Cardiovascular Disease | Admitting: Cardiovascular Disease

## 2013-10-18 VITALS — BP 140/80 | HR 62 | Ht 61.5 in | Wt 161.0 lb

## 2013-10-18 DIAGNOSIS — I2510 Atherosclerotic heart disease of native coronary artery without angina pectoris: Secondary | ICD-10-CM | POA: Diagnosis not present

## 2013-10-18 DIAGNOSIS — E785 Hyperlipidemia, unspecified: Secondary | ICD-10-CM

## 2013-10-18 DIAGNOSIS — I10 Essential (primary) hypertension: Secondary | ICD-10-CM | POA: Diagnosis not present

## 2013-10-18 MED ORDER — ROSUVASTATIN CALCIUM 10 MG PO TABS: 10.0000 mg | ORAL_TABLET | Freq: Every day | ORAL | Status: DC

## 2013-10-18 MED ORDER — NITROGLYCERIN 0.4 MG SL SUBL: 0.4000 mg | SUBLINGUAL_TABLET | SUBLINGUAL | Status: DC | PRN

## 2013-10-18 NOTE — Progress Notes (Signed)
HPI:  72 year old woman presenting for followup evaluation. The patient underwent multivessel stenting in April 2013 after presenting with a non-ST elevation MI. She was treated with stenting of the right coronary artery initially followed by staged PCI of the LAD and left circumflex. She was treated with drug-eluting stents. Her left ventricular function was normal with an EF of 60%. She had a stress Myoview scan in December 2013 and this showed normal perfusion and normal LV function. Her last lipids from January 2014 showed a cholesterol of 127, triglycerides 138, HDL 57, and LDL 42. Potassium was stopped at the time of her last office visit. Most recent electrolytes 09/06/2013 showed a potassium of 3.7.  She complains of nocturnal leg cramps. She is also having constant pain in her thighs. She wonders if her statin drug is doing this. She has not experienced this in the past. She's having some low back problems but no radiation of pain down the posterior legs. She continues to participate in the maintenance phase of cardiac rehabilitation without exertional symptoms. She does admit to occasional chest pains with more vigorous activity. The chest pain is nonradiating and short lived. She denies shortness of breath, edema, or palpitations.    Outpatient Encounter Prescriptions as of 10/18/2013  Medication Sig Dispense Refill  . acetaminophen (TYLENOL) 500 MG tablet Take 1,000 mg by mouth every 6 (six) hours as needed. For pain      . ALPRAZolam (XANAX) 0.5 MG tablet Take 1 tablet (0.5 mg total) by mouth 2 (two) times daily as needed for sleep.  60 tablet  5  . amLODipine (NORVASC) 5 MG tablet TAKE 1 TABLET (5 MG TOTAL) BY MOUTH DAILY.  30 tablet  6  . aspirin 81 MG tablet Take 1 tablet (81 mg total) by mouth daily.      Marland Kitchen atorvastatin (LIPITOR) 40 MG tablet Take 1 tablet (40 mg total) by mouth at bedtime.  30 tablet  11  . Calcium Carbonate-Vitamin D (CALTRATE 600+D) 600-400 MG-UNIT per tablet  Take 1 tablet by mouth 2 (two) times daily.       . clopidogrel (PLAVIX) 75 MG tablet Take 1 tablet (75 mg total) by mouth daily.  30 tablet  11  . escitalopram (LEXAPRO) 20 MG tablet Take 1 tablet (20 mg total) by mouth daily.  30 tablet  11  . FOLBIC 2.5-25-2 MG TABS TAKE 1 TABLET EVERY DAY  30 tablet  11  . hydrochlorothiazide (HYDRODIURIL) 25 MG tablet Take 0.5 tablets (12.5 mg total) by mouth daily.  30 tablet  3  . HYDROcodone-acetaminophen (NORCO/VICODIN) 5-325 MG per tablet Take 1 tablet by mouth 2 (two) times daily as needed for pain.  60 tablet  2  . metoprolol (LOPRESSOR) 50 MG tablet TAKE 1 TABLET (50 MG TOTAL) BY MOUTH 2 (TWO) TIMES DAILY.  60 tablet  11  . NEXIUM 40 MG capsule TAKE ONE CAPSULE BY MOUTH EVERY DAY  30 capsule  11  . nitroGLYCERIN (NITROSTAT) 0.4 MG SL tablet Place 1 tablet (0.4 mg total) under the tongue every 5 (five) minutes x 3 doses as needed for chest pain.  25 tablet  4  . temazepam (RESTORIL) 30 MG capsule Take 1 capsule (30 mg total) by mouth at bedtime as needed.  30 capsule  5  . [DISCONTINUED] amLODipine (NORVASC) 5 MG tablet Take 1 tablet (5 mg total) by mouth daily.  1 tablet  0  . [DISCONTINUED] metoprolol (LOPRESSOR) 50 MG tablet Take one tablet  by mouth twice a day and take an extra tablet daily as needed for elevated BP  1 tablet  0   No facility-administered encounter medications on file as of 10/18/2013.    Allergies  Allergen Reactions  . Penicillins Hives and Swelling  . Sulfonamide Derivatives Hives and Swelling  . Zinc Oxide Rash    Past Medical History  Diagnosis Date  . Hypokalemia   . HTN (hypertension)     essential  . Sleep apnea   . Endometrial polyp   . CAD (coronary artery disease)     NSTEMI 03/2012 ([facility] 03/27/12: pLAD 30%, mLAD 99%, mCFX 95%, mRCA 99% with thrombus, EF 65%);  s/p PTCA/DESx1 to prox-mid RCA 03/27/12 urgently in setting of hypotension/bradycardia, with staged PTCA/Evolve study stent to mid LAD & PTCA/Evolve study  stent to prox LCx 03/29/12 ;   echo 03/28/12:EF 60%, Aortic sclerosis without AS, mild RVE, mild reduced RVSF    . Hyperglycemia     A1C 5.7 03/2012  . Hyperlipidemia   . Atrial tachycardia     noted at cardiac rehab 5/13; event monitor ordered to assess for AFib    ROS: Negative except as per HPI  BP 140/80  Pulse 62  Ht 5' 1.5" (1.562 m)  Wt 161 lb (73.029 kg)  BMI 29.93 kg/m2  PHYSICAL EXAM: Pt is alert and oriented, NAD HEENT: normal Neck: JVP - normal, carotids 2+= without bruits Lungs: CTA bilaterally CV: RRR without murmur or gallop Abd: soft, NT, Positive BS, no hepatomegaly Ext: no C/C/E, distal pulses intact and equal Skin: warm/dry no rash  EKG:  Normal sinus rhythm 62 beats per minute, within normal limits.  ASSESSMENT AND PLAN: 1. Coronary artery disease, native vessel. The patient is stable with CCS class 1-2 anginal symptoms. With normal activities she seems to be doing fine. I did not adjust her antianginal medications today. She was intolerant to higher dose of amlodipine because of swelling.  2. Hyperlipidemia. She is likely having statin-induced myalgias. I am going to try her on Crestor 10 mg daily and will repeat lipids and LFTs in 3 months.  3. Hypertension, essential. Blood pressure is well controlled on a combination of amlodipine, hydrochlorothiazide, and metoprolol. Will repeat a metabolic panel in 3 months when she has lipids and LFTs checked because of hypokalemia in the past.  Tonny Bollman 10/18/2013 4:58 PM

## 2013-10-18 NOTE — Patient Instructions (Addendum)
Your physician has recommended you make the following change in your medication: STOP Atorvastatin, START Crestor 10mg  take one by mouth every evening  Your physician recommends that you return for a FASTING LIPID, BMP and LIVER profile in 3 MONTHS--nothing to eat or drink after midnight, lab opens at 7:30 am (the pt will call back to schedule)  Your physician wants you to follow-up in: 6 MONTHS with Dr Excell Seltzer.  You will receive a reminder letter in the mail two months in advance. If you don't receive a letter, please call our office to schedule the follow-up appointment.

## 2013-10-20 ENCOUNTER — Ambulatory Visit (INDEPENDENT_AMBULATORY_CARE_PROVIDER_SITE_OTHER): Payer: Medicare Other | Admitting: Internal Medicine

## 2013-10-20 ENCOUNTER — Encounter: Payer: Self-pay | Admitting: Internal Medicine

## 2013-10-20 ENCOUNTER — Encounter (HOSPITAL_COMMUNITY)
Admission: RE | Admit: 2013-10-20 | Discharge: 2013-10-20 | Disposition: A | Discharge status: Home / Self Care | Payer: Self-pay | Source: Ambulatory Visit | Attending: Cardiovascular Disease | Admitting: Cardiovascular Disease

## 2013-10-20 ENCOUNTER — Telehealth: Payer: Self-pay | Admitting: Internal Medicine

## 2013-10-20 VITALS — BP 128/78 | HR 68 | Temp 95.9°F | Ht 61.0 in | Wt 160.0 lb

## 2013-10-20 DIAGNOSIS — IMO0001 Reserved for inherently not codable concepts without codable children: Secondary | ICD-10-CM | POA: Diagnosis not present

## 2013-10-20 DIAGNOSIS — I2510 Atherosclerotic heart disease of native coronary artery without angina pectoris: Secondary | ICD-10-CM | POA: Diagnosis not present

## 2013-10-20 DIAGNOSIS — IMO0002 Reserved for concepts with insufficient information to code with codable children: Secondary | ICD-10-CM

## 2013-10-20 DIAGNOSIS — M5416 Radiculopathy, lumbar region: Secondary | ICD-10-CM

## 2013-10-20 LAB — CBC WITH DIFFERENTIAL/PLATELET
Basophils Absolute: 0 10*3/uL (ref 0.0–0.1)
Lymphocytes Relative: 32 % (ref 12–46)
Lymphs Abs: 1.4 10*3/uL (ref 0.7–4.0)
MCHC: 35.3 g/dL (ref 30.0–36.0)
Neutrophils Relative %: 59 % (ref 43–77)
Platelets: 163 10*3/uL (ref 150–400)
RBC: 4.43 MIL/uL (ref 3.87–5.11)
WBC: 4.5 10*3/uL (ref 4.0–10.5)

## 2013-10-20 MED ORDER — HYDROCODONE-ACETAMINOPHEN 10-325 MG PO TABS: 1.0000 | ORAL_TABLET | Freq: Three times a day (TID) | ORAL | Status: DC | PRN

## 2013-10-20 NOTE — Patient Instructions (Addendum)
Take hydrocodone sparingly for back pain. We will schedule an appointment with orthopedist regarding right knee pain. Regarding back pain with radiculopathy, we are going to try to get MRI of LS-spine approved. We are also checking you for various types of myositis and arthritis due to leg pain

## 2013-10-20 NOTE — Telephone Encounter (Signed)
Dr Lenord Fellers advised.

## 2013-10-21 LAB — COMPREHENSIVE METABOLIC PANEL
ALT: 18 U/L (ref 0–35)
Alkaline Phosphatase: 70 U/L (ref 39–117)
Creat: 0.7 mg/dL (ref 0.50–1.10)
Glucose, Bld: 103 mg/dL — ABNORMAL HIGH (ref 70–99)
Sodium: 139 mEq/L (ref 135–145)
Total Bilirubin: 0.6 mg/dL (ref 0.3–1.2)
Total Protein: 6.7 g/dL (ref 6.0–8.3)

## 2013-10-21 LAB — CK TOTAL AND CKMB (NOT AT ARMC): CK, MB: 2 ng/mL (ref 0.3–4.0)

## 2013-10-21 LAB — SEDIMENTATION RATE: Sed Rate: 4 mm/hr (ref 0–22)

## 2013-10-21 LAB — RHEUMATOID FACTOR: Rhuematoid fact SerPl-aCnc: <10 IU/mL (ref <=14)

## 2013-10-21 LAB — T4, FREE: Free T4: 1.09 ng/dL (ref 0.80–1.80)

## 2013-10-23 ENCOUNTER — Ambulatory Visit
Admission: RE | Admit: 2013-10-23 | Discharge: 2013-10-23 | Disposition: A | Discharge status: Home / Self Care | Payer: Medicare Other | Source: Ambulatory Visit | Attending: Internal Medicine | Admitting: Internal Medicine

## 2013-10-23 ENCOUNTER — Encounter (HOSPITAL_COMMUNITY)
Admission: RE | Admit: 2013-10-23 | Discharge: 2013-10-23 | Disposition: A | Discharge status: Home / Self Care | Payer: Self-pay | Source: Ambulatory Visit | Attending: Cardiovascular Disease | Admitting: Cardiovascular Disease

## 2013-10-23 DIAGNOSIS — M5126 Other intervertebral disc displacement, lumbar region: Secondary | ICD-10-CM | POA: Diagnosis not present

## 2013-10-23 DIAGNOSIS — Z5189 Encounter for other specified aftercare: Secondary | ICD-10-CM | POA: Insufficient documentation

## 2013-10-23 DIAGNOSIS — I10 Essential (primary) hypertension: Secondary | ICD-10-CM | POA: Insufficient documentation

## 2013-10-23 DIAGNOSIS — R9431 Abnormal electrocardiogram [ECG] [EKG]: Secondary | ICD-10-CM | POA: Insufficient documentation

## 2013-10-23 DIAGNOSIS — E785 Hyperlipidemia, unspecified: Secondary | ICD-10-CM | POA: Insufficient documentation

## 2013-10-23 DIAGNOSIS — M48061 Spinal stenosis, lumbar region without neurogenic claudication: Secondary | ICD-10-CM | POA: Diagnosis not present

## 2013-10-23 DIAGNOSIS — I214 Non-ST elevation (NSTEMI) myocardial infarction: Secondary | ICD-10-CM | POA: Insufficient documentation

## 2013-10-23 DIAGNOSIS — Z9861 Coronary angioplasty status: Secondary | ICD-10-CM | POA: Insufficient documentation

## 2013-10-23 DIAGNOSIS — I498 Other specified cardiac arrhythmias: Secondary | ICD-10-CM | POA: Insufficient documentation

## 2013-10-23 DIAGNOSIS — I2510 Atherosclerotic heart disease of native coronary artery without angina pectoris: Secondary | ICD-10-CM | POA: Insufficient documentation

## 2013-10-23 DIAGNOSIS — M5416 Radiculopathy, lumbar region: Secondary | ICD-10-CM

## 2013-10-23 LAB — ANA: Anti Nuclear Antibody(ANA): NEGATIVE

## 2013-10-23 LAB — CYCLIC CITRUL PEPTIDE ANTIBODY, IGG: Cyclic Citrullin Peptide Ab: <2 U/mL (ref 0.0–5.0)

## 2013-10-24 DIAGNOSIS — M1710 Unilateral primary osteoarthritis, unspecified knee: Secondary | ICD-10-CM | POA: Diagnosis not present

## 2013-10-25 ENCOUNTER — Encounter (HOSPITAL_COMMUNITY): Payer: Medicare Other

## 2013-10-26 DIAGNOSIS — H18839 Recurrent erosion of cornea, unspecified eye: Secondary | ICD-10-CM | POA: Diagnosis not present

## 2013-10-27 ENCOUNTER — Encounter (HOSPITAL_COMMUNITY): Payer: Medicare Other

## 2013-10-27 ENCOUNTER — Telehealth: Payer: Self-pay | Admitting: Internal Medicine

## 2013-10-27 DIAGNOSIS — N281 Cyst of kidney, acquired: Secondary | ICD-10-CM

## 2013-10-27 NOTE — Addendum Note (Signed)
Addended by: Margaree Mackintosh on: 10/27/2013 10:03 AM   Modules accepted: Orders

## 2013-10-27 NOTE — Telephone Encounter (Signed)
Long discussion about MRI LS spine .Likely symptomatic L4-L5. Does not want to have surgery. Might consider epidural steroids but is on Plavix and that would need to be stopped in advance if OK with cardiologist. Also issue of 6 cm ? Cyst right kidney needs ultrasound evaluation.

## 2013-10-30 ENCOUNTER — Ambulatory Visit
Admission: RE | Admit: 2013-10-30 | Discharge: 2013-10-30 | Disposition: A | Discharge status: Home / Self Care | Payer: Medicare Other | Source: Ambulatory Visit | Attending: Internal Medicine | Admitting: Internal Medicine

## 2013-10-30 ENCOUNTER — Encounter (HOSPITAL_COMMUNITY)
Admission: RE | Admit: 2013-10-30 | Discharge: 2013-10-30 | Disposition: A | Discharge status: Home / Self Care | Payer: Self-pay | Source: Ambulatory Visit | Attending: Cardiovascular Disease | Admitting: Cardiovascular Disease

## 2013-10-30 DIAGNOSIS — N281 Cyst of kidney, acquired: Secondary | ICD-10-CM

## 2013-10-30 DIAGNOSIS — R9389 Abnormal findings on diagnostic imaging of other specified body structures: Secondary | ICD-10-CM | POA: Diagnosis not present

## 2013-10-30 NOTE — Progress Notes (Signed)
Patient notified on 11/7.

## 2013-11-01 ENCOUNTER — Encounter (HOSPITAL_COMMUNITY)
Admission: RE | Admit: 2013-11-01 | Discharge: 2013-11-01 | Disposition: A | Discharge status: Home / Self Care | Payer: Self-pay | Source: Ambulatory Visit | Attending: Cardiovascular Disease | Admitting: Cardiovascular Disease

## 2013-11-03 ENCOUNTER — Encounter (HOSPITAL_COMMUNITY)
Admission: RE | Admit: 2013-11-03 | Discharge: 2013-11-03 | Disposition: A | Discharge status: Home / Self Care | Payer: Self-pay | Source: Ambulatory Visit | Attending: Cardiovascular Disease | Admitting: Cardiovascular Disease

## 2013-11-06 ENCOUNTER — Encounter (HOSPITAL_COMMUNITY)
Admission: RE | Admit: 2013-11-06 | Discharge: 2013-11-06 | Disposition: A | Discharge status: Home / Self Care | Payer: Self-pay | Source: Ambulatory Visit | Attending: Cardiovascular Disease | Admitting: Cardiovascular Disease

## 2013-11-08 ENCOUNTER — Encounter (HOSPITAL_COMMUNITY)
Admission: RE | Admit: 2013-11-08 | Discharge: 2013-11-08 | Disposition: A | Discharge status: Home / Self Care | Payer: Self-pay | Source: Ambulatory Visit | Attending: Cardiovascular Disease | Admitting: Cardiovascular Disease

## 2013-11-10 ENCOUNTER — Encounter (HOSPITAL_COMMUNITY)
Admission: RE | Admit: 2013-11-10 | Discharge: 2013-11-10 | Disposition: A | Discharge status: Home / Self Care | Payer: Self-pay | Source: Ambulatory Visit | Attending: Cardiovascular Disease | Admitting: Cardiovascular Disease

## 2013-11-13 ENCOUNTER — Encounter (HOSPITAL_COMMUNITY): Payer: Medicare Other

## 2013-11-15 ENCOUNTER — Encounter (HOSPITAL_COMMUNITY)
Admission: RE | Admit: 2013-11-15 | Discharge: 2013-11-15 | Disposition: A | Discharge status: Home / Self Care | Payer: Self-pay | Source: Ambulatory Visit | Attending: Cardiovascular Disease | Admitting: Cardiovascular Disease

## 2013-11-20 ENCOUNTER — Encounter (HOSPITAL_COMMUNITY)
Admission: RE | Admit: 2013-11-20 | Discharge: 2013-11-20 | Disposition: A | Discharge status: Home / Self Care | Payer: Self-pay | Source: Ambulatory Visit | Attending: Cardiovascular Disease | Admitting: Cardiovascular Disease

## 2013-11-20 DIAGNOSIS — I214 Non-ST elevation (NSTEMI) myocardial infarction: Secondary | ICD-10-CM | POA: Insufficient documentation

## 2013-11-20 DIAGNOSIS — E785 Hyperlipidemia, unspecified: Secondary | ICD-10-CM | POA: Insufficient documentation

## 2013-11-20 DIAGNOSIS — Z9861 Coronary angioplasty status: Secondary | ICD-10-CM | POA: Insufficient documentation

## 2013-11-20 DIAGNOSIS — R9431 Abnormal electrocardiogram [ECG] [EKG]: Secondary | ICD-10-CM | POA: Insufficient documentation

## 2013-11-20 DIAGNOSIS — I498 Other specified cardiac arrhythmias: Secondary | ICD-10-CM | POA: Insufficient documentation

## 2013-11-20 DIAGNOSIS — Z5189 Encounter for other specified aftercare: Secondary | ICD-10-CM | POA: Insufficient documentation

## 2013-11-20 DIAGNOSIS — I2510 Atherosclerotic heart disease of native coronary artery without angina pectoris: Secondary | ICD-10-CM | POA: Insufficient documentation

## 2013-11-20 DIAGNOSIS — I10 Essential (primary) hypertension: Secondary | ICD-10-CM | POA: Insufficient documentation

## 2013-11-22 ENCOUNTER — Encounter (HOSPITAL_COMMUNITY)
Admission: RE | Admit: 2013-11-22 | Discharge: 2013-11-22 | Disposition: A | Discharge status: Home / Self Care | Payer: Self-pay | Source: Ambulatory Visit | Attending: Cardiovascular Disease | Admitting: Cardiovascular Disease

## 2013-11-24 ENCOUNTER — Encounter (HOSPITAL_COMMUNITY)
Admission: RE | Admit: 2013-11-24 | Discharge: 2013-11-24 | Disposition: A | Discharge status: Home / Self Care | Payer: Self-pay | Source: Ambulatory Visit | Attending: Cardiovascular Disease | Admitting: Cardiovascular Disease

## 2013-11-27 ENCOUNTER — Encounter (HOSPITAL_COMMUNITY)
Admission: RE | Admit: 2013-11-27 | Discharge: 2013-11-27 | Disposition: A | Discharge status: Home / Self Care | Payer: Medicare Other | Source: Ambulatory Visit | Attending: Cardiovascular Disease | Admitting: Cardiovascular Disease

## 2013-11-29 ENCOUNTER — Encounter (HOSPITAL_COMMUNITY)
Admission: RE | Admit: 2013-11-29 | Discharge: 2013-11-29 | Disposition: A | Discharge status: Home / Self Care | Payer: Self-pay | Source: Ambulatory Visit | Attending: Cardiovascular Disease | Admitting: Cardiovascular Disease

## 2013-12-01 ENCOUNTER — Encounter (HOSPITAL_COMMUNITY)
Admission: RE | Admit: 2013-12-01 | Discharge: 2013-12-01 | Disposition: A | Discharge status: Home / Self Care | Payer: Self-pay | Source: Ambulatory Visit | Attending: Cardiovascular Disease | Admitting: Cardiovascular Disease

## 2013-12-04 ENCOUNTER — Encounter (HOSPITAL_COMMUNITY)
Admission: RE | Admit: 2013-12-04 | Discharge: 2013-12-04 | Disposition: A | Discharge status: Home / Self Care | Payer: Self-pay | Source: Ambulatory Visit | Attending: Cardiovascular Disease | Admitting: Cardiovascular Disease

## 2013-12-06 ENCOUNTER — Encounter (HOSPITAL_COMMUNITY)
Admission: RE | Admit: 2013-12-06 | Discharge: 2013-12-06 | Disposition: A | Discharge status: Home / Self Care | Payer: Self-pay | Source: Ambulatory Visit | Attending: Cardiovascular Disease | Admitting: Cardiovascular Disease

## 2013-12-08 ENCOUNTER — Encounter (HOSPITAL_COMMUNITY): Payer: Medicare Other

## 2013-12-08 DIAGNOSIS — Q619 Cystic kidney disease, unspecified: Secondary | ICD-10-CM | POA: Diagnosis not present

## 2013-12-11 ENCOUNTER — Encounter (HOSPITAL_COMMUNITY)
Admission: RE | Admit: 2013-12-11 | Discharge: 2013-12-11 | Disposition: A | Discharge status: Home / Self Care | Payer: Self-pay | Source: Ambulatory Visit | Attending: Cardiovascular Disease | Admitting: Cardiovascular Disease

## 2013-12-11 ENCOUNTER — Other Ambulatory Visit (HOSPITAL_COMMUNITY): Payer: Self-pay | Admitting: Urology

## 2013-12-11 DIAGNOSIS — N281 Cyst of kidney, acquired: Secondary | ICD-10-CM

## 2013-12-13 ENCOUNTER — Encounter (HOSPITAL_COMMUNITY)
Admission: RE | Admit: 2013-12-13 | Discharge: 2013-12-13 | Disposition: A | Discharge status: Home / Self Care | Payer: Self-pay | Source: Ambulatory Visit | Attending: Cardiovascular Disease | Admitting: Cardiovascular Disease

## 2013-12-18 ENCOUNTER — Encounter (HOSPITAL_COMMUNITY)
Admission: RE | Admit: 2013-12-18 | Discharge: 2013-12-18 | Disposition: A | Discharge status: Home / Self Care | Payer: Self-pay | Source: Ambulatory Visit | Attending: Cardiovascular Disease | Admitting: Cardiovascular Disease

## 2013-12-19 ENCOUNTER — Other Ambulatory Visit: Payer: Self-pay

## 2013-12-19 MED ORDER — TEMAZEPAM 30 MG PO CAPS: 30.0000 mg | ORAL_CAPSULE | Freq: Every evening | ORAL | Status: DC | PRN

## 2013-12-20 ENCOUNTER — Encounter (HOSPITAL_COMMUNITY)
Admission: RE | Admit: 2013-12-20 | Discharge: 2013-12-20 | Disposition: A | Discharge status: Home / Self Care | Payer: Self-pay | Source: Ambulatory Visit | Attending: Cardiovascular Disease | Admitting: Cardiovascular Disease

## 2013-12-20 ENCOUNTER — Ambulatory Visit (HOSPITAL_COMMUNITY): Payer: Medicare Other

## 2013-12-22 ENCOUNTER — Encounter (HOSPITAL_COMMUNITY)
Admission: RE | Admit: 2013-12-22 | Discharge: 2013-12-22 | Disposition: A | Discharge status: Home / Self Care | Payer: Self-pay | Source: Ambulatory Visit | Attending: Cardiovascular Disease | Admitting: Cardiovascular Disease

## 2013-12-22 DIAGNOSIS — I2510 Atherosclerotic heart disease of native coronary artery without angina pectoris: Secondary | ICD-10-CM | POA: Insufficient documentation

## 2013-12-22 DIAGNOSIS — I214 Non-ST elevation (NSTEMI) myocardial infarction: Secondary | ICD-10-CM | POA: Insufficient documentation

## 2013-12-22 DIAGNOSIS — I10 Essential (primary) hypertension: Secondary | ICD-10-CM | POA: Insufficient documentation

## 2013-12-22 DIAGNOSIS — R9431 Abnormal electrocardiogram [ECG] [EKG]: Secondary | ICD-10-CM | POA: Insufficient documentation

## 2013-12-22 DIAGNOSIS — I498 Other specified cardiac arrhythmias: Secondary | ICD-10-CM | POA: Insufficient documentation

## 2013-12-22 DIAGNOSIS — E785 Hyperlipidemia, unspecified: Secondary | ICD-10-CM | POA: Insufficient documentation

## 2013-12-22 DIAGNOSIS — Z9861 Coronary angioplasty status: Secondary | ICD-10-CM | POA: Insufficient documentation

## 2013-12-22 DIAGNOSIS — Z5189 Encounter for other specified aftercare: Secondary | ICD-10-CM | POA: Insufficient documentation

## 2013-12-25 ENCOUNTER — Ambulatory Visit
Admission: RE | Admit: 2013-12-25 | Discharge: 2013-12-25 | Disposition: A | Discharge status: Home / Self Care | Payer: Medicare Other | Source: Ambulatory Visit | Attending: Urology | Admitting: Urology

## 2013-12-25 ENCOUNTER — Encounter (HOSPITAL_COMMUNITY)
Admission: RE | Admit: 2013-12-25 | Discharge: 2013-12-25 | Disposition: A | Discharge status: Home / Self Care | Payer: Self-pay | Source: Ambulatory Visit | Attending: Cardiovascular Disease | Admitting: Cardiovascular Disease

## 2013-12-25 DIAGNOSIS — N281 Cyst of kidney, acquired: Secondary | ICD-10-CM

## 2013-12-25 DIAGNOSIS — N2889 Other specified disorders of kidney and ureter: Secondary | ICD-10-CM | POA: Diagnosis not present

## 2013-12-25 MED ORDER — GADOBENATE DIMEGLUMINE 529 MG/ML IV SOLN
15.0000 mL | Freq: Once | INTRAVENOUS | Status: AC | PRN
  Administered 2013-12-25: 15 mL via INTRAVENOUS

## 2013-12-27 ENCOUNTER — Encounter (HOSPITAL_COMMUNITY)
Admission: RE | Admit: 2013-12-27 | Discharge: 2013-12-27 | Disposition: A | Discharge status: Home / Self Care | Payer: Self-pay | Source: Ambulatory Visit | Attending: Cardiovascular Disease | Admitting: Cardiovascular Disease

## 2013-12-29 ENCOUNTER — Encounter (HOSPITAL_COMMUNITY)
Admission: RE | Admit: 2013-12-29 | Discharge: 2013-12-29 | Disposition: A | Discharge status: Home / Self Care | Payer: Self-pay | Source: Ambulatory Visit | Attending: Cardiovascular Disease | Admitting: Cardiovascular Disease

## 2014-01-01 ENCOUNTER — Encounter (HOSPITAL_COMMUNITY)
Admission: RE | Admit: 2014-01-01 | Discharge: 2014-01-01 | Disposition: A | Discharge status: Home / Self Care | Payer: Self-pay | Source: Ambulatory Visit | Attending: Cardiovascular Disease | Admitting: Cardiovascular Disease

## 2014-01-03 ENCOUNTER — Encounter (HOSPITAL_COMMUNITY)
Admission: RE | Admit: 2014-01-03 | Discharge: 2014-01-03 | Disposition: A | Discharge status: Home / Self Care | Payer: Self-pay | Source: Ambulatory Visit | Attending: Cardiovascular Disease | Admitting: Cardiovascular Disease

## 2014-01-05 ENCOUNTER — Encounter (HOSPITAL_COMMUNITY)
Admission: RE | Admit: 2014-01-05 | Discharge: 2014-01-05 | Disposition: A | Discharge status: Home / Self Care | Payer: Self-pay | Source: Ambulatory Visit | Attending: Cardiovascular Disease | Admitting: Cardiovascular Disease

## 2014-01-08 ENCOUNTER — Encounter (HOSPITAL_COMMUNITY)
Admission: RE | Admit: 2014-01-08 | Discharge: 2014-01-08 | Disposition: A | Discharge status: Home / Self Care | Payer: Self-pay | Source: Ambulatory Visit | Attending: Cardiovascular Disease | Admitting: Cardiovascular Disease

## 2014-01-10 ENCOUNTER — Encounter (HOSPITAL_COMMUNITY)
Admission: RE | Admit: 2014-01-10 | Discharge: 2014-01-10 | Disposition: A | Discharge status: Home / Self Care | Payer: Self-pay | Source: Ambulatory Visit | Attending: Cardiovascular Disease | Admitting: Cardiovascular Disease

## 2014-01-12 ENCOUNTER — Encounter (HOSPITAL_COMMUNITY)
Admission: RE | Admit: 2014-01-12 | Discharge: 2014-01-12 | Disposition: A | Discharge status: Home / Self Care | Payer: Self-pay | Source: Ambulatory Visit | Attending: Cardiovascular Disease | Admitting: Cardiovascular Disease

## 2014-01-13 ENCOUNTER — Other Ambulatory Visit: Payer: Self-pay | Admitting: Cardiovascular Disease

## 2014-01-15 ENCOUNTER — Encounter (HOSPITAL_COMMUNITY)
Admission: RE | Admit: 2014-01-15 | Discharge: 2014-01-15 | Disposition: A | Discharge status: Home / Self Care | Payer: Self-pay | Source: Ambulatory Visit | Attending: Cardiovascular Disease | Admitting: Cardiovascular Disease

## 2014-01-17 ENCOUNTER — Encounter (HOSPITAL_COMMUNITY)
Admission: RE | Admit: 2014-01-17 | Discharge: 2014-01-17 | Disposition: A | Discharge status: Home / Self Care | Payer: Self-pay | Source: Ambulatory Visit | Attending: Cardiovascular Disease | Admitting: Cardiovascular Disease

## 2014-01-19 ENCOUNTER — Encounter (HOSPITAL_COMMUNITY)
Admission: RE | Admit: 2014-01-19 | Discharge: 2014-01-19 | Disposition: A | Discharge status: Home / Self Care | Payer: Self-pay | Source: Ambulatory Visit | Attending: Cardiovascular Disease | Admitting: Cardiovascular Disease

## 2014-01-22 ENCOUNTER — Encounter (HOSPITAL_COMMUNITY): Payer: Medicare Other

## 2014-01-22 DIAGNOSIS — Z5189 Encounter for other specified aftercare: Secondary | ICD-10-CM | POA: Insufficient documentation

## 2014-01-22 DIAGNOSIS — I498 Other specified cardiac arrhythmias: Secondary | ICD-10-CM | POA: Insufficient documentation

## 2014-01-22 DIAGNOSIS — E785 Hyperlipidemia, unspecified: Secondary | ICD-10-CM | POA: Insufficient documentation

## 2014-01-22 DIAGNOSIS — Z9861 Coronary angioplasty status: Secondary | ICD-10-CM | POA: Insufficient documentation

## 2014-01-22 DIAGNOSIS — I10 Essential (primary) hypertension: Secondary | ICD-10-CM | POA: Insufficient documentation

## 2014-01-22 DIAGNOSIS — I2510 Atherosclerotic heart disease of native coronary artery without angina pectoris: Secondary | ICD-10-CM | POA: Insufficient documentation

## 2014-01-22 DIAGNOSIS — I214 Non-ST elevation (NSTEMI) myocardial infarction: Secondary | ICD-10-CM | POA: Insufficient documentation

## 2014-01-22 DIAGNOSIS — R9431 Abnormal electrocardiogram [ECG] [EKG]: Secondary | ICD-10-CM | POA: Insufficient documentation

## 2014-01-24 ENCOUNTER — Encounter (HOSPITAL_COMMUNITY)
Admission: RE | Admit: 2014-01-24 | Discharge: 2014-01-24 | Disposition: A | Discharge status: Home / Self Care | Payer: Self-pay | Source: Ambulatory Visit | Attending: Cardiovascular Disease | Admitting: Cardiovascular Disease

## 2014-01-26 ENCOUNTER — Encounter (HOSPITAL_COMMUNITY)
Admission: RE | Admit: 2014-01-26 | Discharge: 2014-01-26 | Disposition: A | Discharge status: Home / Self Care | Payer: Self-pay | Source: Ambulatory Visit | Attending: Cardiovascular Disease | Admitting: Cardiovascular Disease

## 2014-01-27 ENCOUNTER — Other Ambulatory Visit: Payer: Self-pay | Admitting: Internal Medicine

## 2014-01-29 ENCOUNTER — Encounter (HOSPITAL_COMMUNITY)
Admission: RE | Admit: 2014-01-29 | Discharge: 2014-01-29 | Disposition: A | Discharge status: Home / Self Care | Payer: Self-pay | Source: Ambulatory Visit | Attending: Cardiovascular Disease | Admitting: Cardiovascular Disease

## 2014-01-30 ENCOUNTER — Other Ambulatory Visit: Payer: Self-pay

## 2014-01-30 ENCOUNTER — Telehealth: Payer: Self-pay | Admitting: Internal Medicine

## 2014-01-30 MED ORDER — HYDROCODONE-ACETAMINOPHEN 10-325 MG PO TABS: 1.0000 | ORAL_TABLET | Freq: Three times a day (TID) | ORAL | Status: DC | PRN

## 2014-01-30 NOTE — Telephone Encounter (Signed)
Please refill x 90 days 

## 2014-01-31 ENCOUNTER — Encounter (HOSPITAL_COMMUNITY)
Admission: RE | Admit: 2014-01-31 | Discharge: 2014-01-31 | Disposition: A | Discharge status: Home / Self Care | Payer: Self-pay | Source: Ambulatory Visit | Attending: Cardiovascular Disease | Admitting: Cardiovascular Disease

## 2014-02-02 ENCOUNTER — Encounter (HOSPITAL_COMMUNITY)
Admission: RE | Admit: 2014-02-02 | Discharge: 2014-02-02 | Disposition: A | Discharge status: Home / Self Care | Payer: Self-pay | Source: Ambulatory Visit | Attending: Cardiovascular Disease | Admitting: Cardiovascular Disease

## 2014-02-05 ENCOUNTER — Encounter (HOSPITAL_COMMUNITY): Payer: Medicare Other

## 2014-02-07 ENCOUNTER — Encounter (HOSPITAL_COMMUNITY): Payer: Medicare Other

## 2014-02-09 ENCOUNTER — Encounter (HOSPITAL_COMMUNITY): Payer: Medicare Other

## 2014-02-12 ENCOUNTER — Encounter (HOSPITAL_COMMUNITY): Payer: Medicare Other

## 2014-02-14 ENCOUNTER — Encounter (HOSPITAL_COMMUNITY): Payer: Medicare Other

## 2014-02-15 ENCOUNTER — Other Ambulatory Visit: Payer: Self-pay | Admitting: Internal Medicine

## 2014-02-16 ENCOUNTER — Encounter (HOSPITAL_COMMUNITY): Payer: Medicare Other

## 2014-02-17 NOTE — Progress Notes (Signed)
   Subjective:    Patient ID: Carrie Chung, female    DOB: [birth year], 73 y.o.   MRN: [id number]  HPI Having issues with recurrent low back pain and radiculopathy. She's also having issues with right knee pain. Issues with back pain are long-standing. At times, back pain is rather severe. Having bilateral leg pain. History of coronary artery disease status post MI.    Review of Systems     Objective:   Physical Exam Crepitus right knee    Good range of motion of knee.  Straight leg raising is negative at 90 bilaterally. Muscle strength is normal in the lower extremities. Deep tendon reflexes 1+ and symmetrical in the knees        Assessment & Plan:  Recurrent low back pain with radiculopathy  Left knee osteoarthritis  Plan: Take hydrocodone sparingly for low back pain. Will order MRI of LS-spine. Orthopedic appointment for  knee pain. Also bilateral leg pain, check labs for myositis and arthritis  Addendum: See MRI report. There is an issue of possible kidney abnormality. This will be pursued with ultrasound of kidneys. Likely has impingement at L4-L5 on MRI.  Addendum February 28: Has bilobed cyst upper pole right kidney which is fairly large. MRA has been ordered. Radiologist recommends repeat study in one year  25 minutes spent with patient

## 2014-02-19 ENCOUNTER — Encounter (HOSPITAL_COMMUNITY): Payer: Medicare Other

## 2014-02-19 DIAGNOSIS — E785 Hyperlipidemia, unspecified: Secondary | ICD-10-CM | POA: Insufficient documentation

## 2014-02-19 DIAGNOSIS — I498 Other specified cardiac arrhythmias: Secondary | ICD-10-CM | POA: Insufficient documentation

## 2014-02-19 DIAGNOSIS — Z9861 Coronary angioplasty status: Secondary | ICD-10-CM | POA: Insufficient documentation

## 2014-02-19 DIAGNOSIS — I2510 Atherosclerotic heart disease of native coronary artery without angina pectoris: Secondary | ICD-10-CM | POA: Insufficient documentation

## 2014-02-19 DIAGNOSIS — Z5189 Encounter for other specified aftercare: Secondary | ICD-10-CM | POA: Insufficient documentation

## 2014-02-19 DIAGNOSIS — I214 Non-ST elevation (NSTEMI) myocardial infarction: Secondary | ICD-10-CM | POA: Insufficient documentation

## 2014-02-19 DIAGNOSIS — R9431 Abnormal electrocardiogram [ECG] [EKG]: Secondary | ICD-10-CM | POA: Insufficient documentation

## 2014-02-19 DIAGNOSIS — I10 Essential (primary) hypertension: Secondary | ICD-10-CM | POA: Insufficient documentation

## 2014-02-21 ENCOUNTER — Encounter (HOSPITAL_COMMUNITY): Payer: Medicare Other

## 2014-02-23 ENCOUNTER — Encounter (HOSPITAL_COMMUNITY): Payer: Medicare Other

## 2014-02-26 ENCOUNTER — Encounter (HOSPITAL_COMMUNITY)
Admission: RE | Admit: 2014-02-26 | Discharge: 2014-02-26 | Disposition: A | Discharge status: Home / Self Care | Payer: Self-pay | Source: Ambulatory Visit | Attending: Cardiovascular Disease | Admitting: Cardiovascular Disease

## 2014-02-28 ENCOUNTER — Encounter (HOSPITAL_COMMUNITY)
Admission: RE | Admit: 2014-02-28 | Discharge: 2014-02-28 | Disposition: A | Discharge status: Home / Self Care | Payer: Self-pay | Source: Ambulatory Visit | Attending: Cardiovascular Disease | Admitting: Cardiovascular Disease

## 2014-03-02 ENCOUNTER — Encounter (HOSPITAL_COMMUNITY)
Admission: RE | Admit: 2014-03-02 | Discharge: 2014-03-02 | Disposition: A | Discharge status: Home / Self Care | Payer: Self-pay | Source: Ambulatory Visit | Attending: Cardiovascular Disease | Admitting: Cardiovascular Disease

## 2014-03-05 ENCOUNTER — Encounter (HOSPITAL_COMMUNITY)
Admission: RE | Admit: 2014-03-05 | Discharge: 2014-03-05 | Disposition: A | Discharge status: Home / Self Care | Payer: Self-pay | Source: Ambulatory Visit | Attending: Cardiovascular Disease | Admitting: Cardiovascular Disease

## 2014-03-05 DIAGNOSIS — M1710 Unilateral primary osteoarthritis, unspecified knee: Secondary | ICD-10-CM | POA: Diagnosis not present

## 2014-03-06 ENCOUNTER — Other Ambulatory Visit: Payer: Medicare Other

## 2014-03-07 ENCOUNTER — Encounter (HOSPITAL_COMMUNITY): Payer: Medicare Other

## 2014-03-08 ENCOUNTER — Other Ambulatory Visit (INDEPENDENT_AMBULATORY_CARE_PROVIDER_SITE_OTHER): Payer: Medicare Other | Admitting: *Deleted

## 2014-03-08 DIAGNOSIS — I2510 Atherosclerotic heart disease of native coronary artery without angina pectoris: Secondary | ICD-10-CM

## 2014-03-08 DIAGNOSIS — I10 Essential (primary) hypertension: Secondary | ICD-10-CM

## 2014-03-08 DIAGNOSIS — E785 Hyperlipidemia, unspecified: Secondary | ICD-10-CM

## 2014-03-08 LAB — BASIC METABOLIC PANEL
BUN: 13 mg/dL (ref 6–23)
CHLORIDE: 100 meq/L (ref 96–112)
CO2: 32 mEq/L (ref 19–32)
CREATININE: 0.7 mg/dL (ref 0.4–1.2)
Calcium: 9.2 mg/dL (ref 8.4–10.5)
GFR: 93.17 mL/min (ref >60.00)
Glucose, Bld: 99 mg/dL (ref 70–99)
POTASSIUM: 3.9 meq/L (ref 3.5–5.1)
Sodium: 141 mEq/L (ref 135–145)

## 2014-03-08 LAB — LIPID PANEL
CHOL/HDL RATIO: 2
Cholesterol: 158 mg/dL (ref 0–200)
HDL: 71.1 mg/dL (ref >39.00)
LDL Cholesterol: 59 mg/dL (ref 0–99)
Triglycerides: 139 mg/dL (ref 0.0–149.0)
VLDL: 27.8 mg/dL (ref 0.0–40.0)

## 2014-03-08 LAB — HEPATIC FUNCTION PANEL
ALBUMIN: 4.3 g/dL (ref 3.5–5.2)
ALK PHOS: 57 U/L (ref 39–117)
ALT: 16 U/L (ref 0–35)
AST: 21 U/L (ref 0–37)
BILIRUBIN DIRECT: 0.1 mg/dL (ref 0.0–0.3)
TOTAL PROTEIN: 7.1 g/dL (ref 6.0–8.3)
Total Bilirubin: 0.7 mg/dL (ref 0.3–1.2)

## 2014-03-09 ENCOUNTER — Encounter (HOSPITAL_COMMUNITY)
Admission: RE | Admit: 2014-03-09 | Discharge: 2014-03-09 | Disposition: A | Discharge status: Home / Self Care | Payer: Self-pay | Source: Ambulatory Visit | Attending: Cardiovascular Disease | Admitting: Cardiovascular Disease

## 2014-03-12 ENCOUNTER — Encounter (HOSPITAL_COMMUNITY)
Admission: RE | Admit: 2014-03-12 | Discharge: 2014-03-12 | Disposition: A | Discharge status: Home / Self Care | Payer: Self-pay | Source: Ambulatory Visit | Attending: Cardiovascular Disease | Admitting: Cardiovascular Disease

## 2014-03-14 ENCOUNTER — Encounter (HOSPITAL_COMMUNITY)
Admission: RE | Admit: 2014-03-14 | Discharge: 2014-03-14 | Disposition: A | Discharge status: Home / Self Care | Payer: Self-pay | Source: Ambulatory Visit | Attending: Cardiovascular Disease | Admitting: Cardiovascular Disease

## 2014-03-16 ENCOUNTER — Encounter (HOSPITAL_COMMUNITY)
Admission: RE | Admit: 2014-03-16 | Discharge: 2014-03-16 | Disposition: A | Discharge status: Home / Self Care | Payer: Self-pay | Source: Ambulatory Visit | Attending: Cardiovascular Disease | Admitting: Cardiovascular Disease

## 2014-03-19 ENCOUNTER — Encounter (HOSPITAL_COMMUNITY)
Admission: RE | Admit: 2014-03-19 | Discharge: 2014-03-19 | Disposition: A | Discharge status: Home / Self Care | Payer: Self-pay | Source: Ambulatory Visit | Attending: Cardiovascular Disease | Admitting: Cardiovascular Disease

## 2014-03-21 ENCOUNTER — Encounter (HOSPITAL_COMMUNITY): Payer: Medicare Other

## 2014-03-21 DIAGNOSIS — Z5189 Encounter for other specified aftercare: Secondary | ICD-10-CM | POA: Insufficient documentation

## 2014-03-21 DIAGNOSIS — Z9861 Coronary angioplasty status: Secondary | ICD-10-CM | POA: Insufficient documentation

## 2014-03-21 DIAGNOSIS — I2510 Atherosclerotic heart disease of native coronary artery without angina pectoris: Secondary | ICD-10-CM | POA: Insufficient documentation

## 2014-03-21 DIAGNOSIS — I214 Non-ST elevation (NSTEMI) myocardial infarction: Secondary | ICD-10-CM | POA: Insufficient documentation

## 2014-03-21 DIAGNOSIS — I498 Other specified cardiac arrhythmias: Secondary | ICD-10-CM | POA: Insufficient documentation

## 2014-03-21 DIAGNOSIS — R9431 Abnormal electrocardiogram [ECG] [EKG]: Secondary | ICD-10-CM | POA: Insufficient documentation

## 2014-03-21 DIAGNOSIS — I10 Essential (primary) hypertension: Secondary | ICD-10-CM | POA: Insufficient documentation

## 2014-03-21 DIAGNOSIS — E785 Hyperlipidemia, unspecified: Secondary | ICD-10-CM | POA: Insufficient documentation

## 2014-03-23 ENCOUNTER — Encounter (HOSPITAL_COMMUNITY)
Admission: RE | Admit: 2014-03-23 | Discharge: 2014-03-23 | Disposition: A | Discharge status: Home / Self Care | Payer: Self-pay | Source: Ambulatory Visit | Attending: Cardiovascular Disease | Admitting: Cardiovascular Disease

## 2014-03-26 ENCOUNTER — Encounter (HOSPITAL_COMMUNITY)
Admission: RE | Admit: 2014-03-26 | Discharge: 2014-03-26 | Disposition: A | Discharge status: Home / Self Care | Payer: Self-pay | Source: Ambulatory Visit | Attending: Cardiovascular Disease | Admitting: Cardiovascular Disease

## 2014-03-28 ENCOUNTER — Encounter (HOSPITAL_COMMUNITY)
Admission: RE | Admit: 2014-03-28 | Discharge: 2014-03-28 | Disposition: A | Discharge status: Home / Self Care | Payer: Self-pay | Source: Ambulatory Visit | Attending: Cardiovascular Disease | Admitting: Cardiovascular Disease

## 2014-03-28 ENCOUNTER — Other Ambulatory Visit: Payer: Self-pay

## 2014-03-28 MED ORDER — ESCITALOPRAM OXALATE 20 MG PO TABS: 20.0000 mg | ORAL_TABLET | Freq: Every day | ORAL | Status: DC

## 2014-03-30 ENCOUNTER — Encounter (HOSPITAL_COMMUNITY)
Admission: RE | Admit: 2014-03-30 | Discharge: 2014-03-30 | Disposition: A | Discharge status: Home / Self Care | Payer: Self-pay | Source: Ambulatory Visit | Attending: Cardiovascular Disease | Admitting: Cardiovascular Disease

## 2014-04-02 ENCOUNTER — Encounter (HOSPITAL_COMMUNITY)
Admission: RE | Admit: 2014-04-02 | Discharge: 2014-04-02 | Disposition: A | Discharge status: Home / Self Care | Payer: Self-pay | Source: Ambulatory Visit | Attending: Cardiovascular Disease | Admitting: Cardiovascular Disease

## 2014-04-04 ENCOUNTER — Encounter (HOSPITAL_COMMUNITY)
Admission: RE | Admit: 2014-04-04 | Discharge: 2014-04-04 | Disposition: A | Discharge status: Home / Self Care | Payer: Self-pay | Source: Ambulatory Visit | Attending: Cardiovascular Disease | Admitting: Cardiovascular Disease

## 2014-04-06 ENCOUNTER — Encounter (HOSPITAL_COMMUNITY)
Admission: RE | Admit: 2014-04-06 | Discharge: 2014-04-06 | Disposition: A | Discharge status: Home / Self Care | Payer: Self-pay | Source: Ambulatory Visit | Attending: Cardiovascular Disease | Admitting: Cardiovascular Disease

## 2014-04-09 ENCOUNTER — Encounter (HOSPITAL_COMMUNITY)
Admission: RE | Admit: 2014-04-09 | Discharge: 2014-04-09 | Disposition: A | Discharge status: Home / Self Care | Payer: Self-pay | Source: Ambulatory Visit | Attending: Cardiovascular Disease | Admitting: Cardiovascular Disease

## 2014-04-11 ENCOUNTER — Encounter (HOSPITAL_COMMUNITY)
Admission: RE | Admit: 2014-04-11 | Discharge: 2014-04-11 | Disposition: A | Discharge status: Home / Self Care | Payer: Self-pay | Source: Ambulatory Visit | Attending: Cardiovascular Disease | Admitting: Cardiovascular Disease

## 2014-04-13 ENCOUNTER — Encounter (HOSPITAL_COMMUNITY): Payer: Medicare Other

## 2014-04-16 ENCOUNTER — Encounter (HOSPITAL_COMMUNITY): Payer: Medicare Other

## 2014-04-18 ENCOUNTER — Encounter (HOSPITAL_COMMUNITY): Payer: Medicare Other

## 2014-04-20 ENCOUNTER — Encounter (HOSPITAL_COMMUNITY): Payer: Medicare Other

## 2014-04-22 ENCOUNTER — Other Ambulatory Visit: Payer: Self-pay | Admitting: Cardiovascular Disease

## 2014-04-23 ENCOUNTER — Encounter (HOSPITAL_COMMUNITY)
Admission: RE | Admit: 2014-04-23 | Discharge: 2014-04-23 | Disposition: A | Discharge status: Home / Self Care | Payer: Medicare Other | Source: Ambulatory Visit | Attending: Cardiovascular Disease | Admitting: Cardiovascular Disease

## 2014-04-23 DIAGNOSIS — I2510 Atherosclerotic heart disease of native coronary artery without angina pectoris: Secondary | ICD-10-CM | POA: Diagnosis not present

## 2014-04-23 DIAGNOSIS — I498 Other specified cardiac arrhythmias: Secondary | ICD-10-CM | POA: Diagnosis not present

## 2014-04-23 DIAGNOSIS — Z9861 Coronary angioplasty status: Secondary | ICD-10-CM | POA: Insufficient documentation

## 2014-04-23 DIAGNOSIS — I10 Essential (primary) hypertension: Secondary | ICD-10-CM | POA: Diagnosis not present

## 2014-04-23 DIAGNOSIS — Z5189 Encounter for other specified aftercare: Secondary | ICD-10-CM | POA: Insufficient documentation

## 2014-04-23 DIAGNOSIS — I214 Non-ST elevation (NSTEMI) myocardial infarction: Secondary | ICD-10-CM | POA: Diagnosis not present

## 2014-04-23 DIAGNOSIS — E785 Hyperlipidemia, unspecified: Secondary | ICD-10-CM | POA: Insufficient documentation

## 2014-04-23 DIAGNOSIS — R9431 Abnormal electrocardiogram [ECG] [EKG]: Secondary | ICD-10-CM | POA: Insufficient documentation

## 2014-04-25 ENCOUNTER — Encounter (HOSPITAL_COMMUNITY)
Admission: RE | Admit: 2014-04-25 | Discharge: 2014-04-25 | Disposition: A | Discharge status: Home / Self Care | Payer: Medicare Other | Source: Ambulatory Visit | Attending: Cardiovascular Disease | Admitting: Cardiovascular Disease

## 2014-04-27 ENCOUNTER — Encounter (HOSPITAL_COMMUNITY)
Admission: RE | Admit: 2014-04-27 | Discharge: 2014-04-27 | Disposition: A | Discharge status: Home / Self Care | Payer: Medicare Other | Source: Ambulatory Visit | Attending: Cardiovascular Disease | Admitting: Cardiovascular Disease

## 2014-04-30 ENCOUNTER — Encounter (HOSPITAL_COMMUNITY)
Admission: RE | Admit: 2014-04-30 | Discharge: 2014-04-30 | Disposition: A | Discharge status: Home / Self Care | Payer: Medicare Other | Source: Ambulatory Visit | Attending: Cardiovascular Disease | Admitting: Cardiovascular Disease

## 2014-04-30 ENCOUNTER — Observation Stay (HOSPITAL_COMMUNITY)
Admission: EM | Admit: 2014-04-30 | Discharge: 2014-05-01 | Disposition: A | Discharge status: Home / Self Care | Payer: Medicare Other | Attending: Cardiology | Admitting: Cardiology

## 2014-04-30 ENCOUNTER — Telehealth: Payer: Self-pay | Admitting: *Deleted

## 2014-04-30 ENCOUNTER — Other Ambulatory Visit: Payer: Self-pay

## 2014-04-30 ENCOUNTER — Emergency Department (HOSPITAL_COMMUNITY): Payer: Medicare Other

## 2014-04-30 ENCOUNTER — Encounter (HOSPITAL_COMMUNITY): Payer: Self-pay | Admitting: Emergency Medicine

## 2014-04-30 DIAGNOSIS — R42 Dizziness and giddiness: Secondary | ICD-10-CM | POA: Insufficient documentation

## 2014-04-30 DIAGNOSIS — I2510 Atherosclerotic heart disease of native coronary artery without angina pectoris: Secondary | ICD-10-CM

## 2014-04-30 DIAGNOSIS — Z9861 Coronary angioplasty status: Secondary | ICD-10-CM | POA: Diagnosis not present

## 2014-04-30 DIAGNOSIS — Z79899 Other long term (current) drug therapy: Secondary | ICD-10-CM | POA: Insufficient documentation

## 2014-04-30 DIAGNOSIS — R0609 Other forms of dyspnea: Secondary | ICD-10-CM

## 2014-04-30 DIAGNOSIS — Z888 Allergy status to other drugs, medicaments and biological substances status: Secondary | ICD-10-CM | POA: Diagnosis not present

## 2014-04-30 DIAGNOSIS — Z8742 Personal history of other diseases of the female genital tract: Secondary | ICD-10-CM | POA: Diagnosis not present

## 2014-04-30 DIAGNOSIS — Z7982 Long term (current) use of aspirin: Secondary | ICD-10-CM | POA: Diagnosis not present

## 2014-04-30 DIAGNOSIS — R072 Precordial pain: Secondary | ICD-10-CM

## 2014-04-30 DIAGNOSIS — R0989 Other specified symptoms and signs involving the circulatory and respiratory systems: Secondary | ICD-10-CM

## 2014-04-30 DIAGNOSIS — I10 Essential (primary) hypertension: Secondary | ICD-10-CM

## 2014-04-30 DIAGNOSIS — Z87891 Personal history of nicotine dependence: Secondary | ICD-10-CM | POA: Insufficient documentation

## 2014-04-30 DIAGNOSIS — Z880 Allergy status to penicillin: Secondary | ICD-10-CM | POA: Insufficient documentation

## 2014-04-30 DIAGNOSIS — R079 Chest pain, unspecified: Secondary | ICD-10-CM | POA: Diagnosis not present

## 2014-04-30 DIAGNOSIS — E785 Hyperlipidemia, unspecified: Secondary | ICD-10-CM

## 2014-04-30 DIAGNOSIS — R110 Nausea: Secondary | ICD-10-CM | POA: Diagnosis not present

## 2014-04-30 DIAGNOSIS — Z7902 Long term (current) use of antithrombotics/antiplatelets: Secondary | ICD-10-CM | POA: Diagnosis not present

## 2014-04-30 DIAGNOSIS — R0789 Other chest pain: Secondary | ICD-10-CM | POA: Diagnosis not present

## 2014-04-30 DIAGNOSIS — Z882 Allergy status to sulfonamides status: Secondary | ICD-10-CM | POA: Diagnosis not present

## 2014-04-30 HISTORY — DX: Old myocardial infarction: I25.2

## 2014-04-30 LAB — CBC WITH DIFFERENTIAL/PLATELET
Basophils Absolute: 0 10*3/uL (ref 0.0–0.1)
Basophils Relative: 0 % (ref 0–1)
Eosinophils Absolute: 0.1 10*3/uL (ref 0.0–0.7)
Eosinophils Relative: 1 % (ref 0–5)
HEMATOCRIT: 43.3 % (ref 36.0–46.0)
Hemoglobin: 14.6 g/dL (ref 12.0–15.0)
LYMPHS ABS: 1.3 10*3/uL (ref 0.7–4.0)
Lymphocytes Relative: 29 % (ref 12–46)
MCH: 31.7 pg (ref 26.0–34.0)
MCHC: 33.7 g/dL (ref 30.0–36.0)
MCV: 93.9 fL (ref 78.0–100.0)
MONO ABS: 0.3 10*3/uL (ref 0.1–1.0)
Monocytes Relative: 6 % (ref 3–12)
NEUTROS ABS: 2.9 10*3/uL (ref 1.7–7.7)
NEUTROS PCT: 64 % (ref 43–77)
Platelets: 124 10*3/uL — ABNORMAL LOW (ref 150–400)
RBC: 4.61 MIL/uL (ref 3.87–5.11)
RDW: 13.5 % (ref 11.5–15.5)
WBC: 4.5 10*3/uL (ref 4.0–10.5)

## 2014-04-30 LAB — BASIC METABOLIC PANEL
BUN: 14 mg/dL (ref 6–23)
CHLORIDE: 100 meq/L (ref 96–112)
CO2: 30 meq/L (ref 19–32)
CREATININE: 0.72 mg/dL (ref 0.50–1.10)
Calcium: 9.6 mg/dL (ref 8.4–10.5)
GFR calc Af Amer: >90 mL/min (ref >90)
GFR calc non Af Amer: 83 mL/min — ABNORMAL LOW (ref >90)
Glucose, Bld: 107 mg/dL — ABNORMAL HIGH (ref 70–99)
POTASSIUM: 4.1 meq/L (ref 3.7–5.3)
Sodium: 142 mEq/L (ref 137–147)

## 2014-04-30 LAB — HEMOGLOBIN A1C
HEMOGLOBIN A1C: 5.7 % — AB (ref <5.7)
MEAN PLASMA GLUCOSE: 117 mg/dL — AB (ref <117)

## 2014-04-30 LAB — TSH: TSH: 3.1 u[IU]/mL (ref 0.350–4.500)

## 2014-04-30 LAB — PROTIME-INR
INR: 1.01 (ref 0.00–1.49)
Prothrombin Time: 13.1 seconds (ref 11.6–15.2)

## 2014-04-30 LAB — TROPONIN I
Troponin I: <0.3 ng/mL (ref <0.30)
Troponin I: <0.3 ng/mL (ref <0.30)

## 2014-04-30 LAB — MAGNESIUM: Magnesium: 1.7 mg/dL (ref 1.5–2.5)

## 2014-04-30 LAB — PRO B NATRIURETIC PEPTIDE: Pro B Natriuretic peptide (BNP): 350.7 pg/mL — ABNORMAL HIGH (ref 0–125)

## 2014-04-30 LAB — I-STAT TROPONIN, ED: TROPONIN I, POC: 0 ng/mL (ref 0.00–0.08)

## 2014-04-30 LAB — D-DIMER, QUANTITATIVE: D-Dimer, Quant: 0.61 ug/mL-FEU — ABNORMAL HIGH (ref 0.00–0.48)

## 2014-04-30 MED ORDER — SODIUM CHLORIDE 0.9 % IJ SOLN
3.0000 mL | Freq: Two times a day (BID) | INTRAMUSCULAR | Status: DC
Start: 2014-04-30 — End: 2014-05-01
  Administered 2014-04-30 – 2014-05-01 (×2): 3 mL via INTRAVENOUS

## 2014-04-30 MED ORDER — HYDROCODONE-ACETAMINOPHEN 10-325 MG PO TABS: 1.0000 | ORAL_TABLET | Freq: Three times a day (TID) | ORAL | Status: DC | PRN

## 2014-04-30 MED ORDER — ASPIRIN 81 MG PO TABS: 81.0000 mg | ORAL_TABLET | Freq: Every day | ORAL | Status: DC

## 2014-04-30 MED ORDER — ASPIRIN 81 MG PO CHEW
324.0000 mg | CHEWABLE_TABLET | Freq: Once | ORAL | Status: AC
  Administered 2014-04-30: 324 mg via ORAL
  Filled 2014-04-30: qty 4

## 2014-04-30 MED ORDER — AMLODIPINE BESYLATE 5 MG PO TABS
5.0000 mg | ORAL_TABLET | Freq: Every day | ORAL | Status: DC
  Administered 2014-05-01: 5 mg via ORAL
  Filled 2014-04-30 (×2): qty 1

## 2014-04-30 MED ORDER — ALPRAZOLAM 0.5 MG PO TABS: 0.5000 mg | ORAL_TABLET | Freq: Two times a day (BID) | ORAL | Status: DC | PRN

## 2014-04-30 MED ORDER — ASPIRIN 81 MG PO CHEW
81.0000 mg | CHEWABLE_TABLET | Freq: Every day | ORAL | Status: DC
  Administered 2014-05-01: 81 mg via ORAL
  Filled 2014-04-30 (×2): qty 1

## 2014-04-30 MED ORDER — ASPIRIN 81 MG PO CHEW: 324.0000 mg | CHEWABLE_TABLET | ORAL | Status: AC

## 2014-04-30 MED ORDER — FA-PYRIDOXINE-CYANOCOBALAMIN 2.5-25-2 MG PO TABS
1.0000 | ORAL_TABLET | Freq: Every day | ORAL | Status: DC
  Administered 2014-05-01: 1 via ORAL
  Filled 2014-04-30 (×2): qty 1

## 2014-04-30 MED ORDER — PANTOPRAZOLE SODIUM 40 MG PO TBEC
80.0000 mg | DELAYED_RELEASE_TABLET | Freq: Every day | ORAL | Status: DC
  Administered 2014-05-01: 80 mg via ORAL
  Filled 2014-04-30 (×2): qty 2

## 2014-04-30 MED ORDER — SODIUM CHLORIDE 0.9 % IV SOLN: 250.0000 mL | INTRAVENOUS | Status: DC | PRN

## 2014-04-30 MED ORDER — TEMAZEPAM 15 MG PO CAPS
30.0000 mg | ORAL_CAPSULE | Freq: Every evening | ORAL | Status: DC | PRN
  Administered 2014-04-30: 30 mg via ORAL
  Filled 2014-04-30: qty 2

## 2014-04-30 MED ORDER — NITROGLYCERIN 0.4 MG SL SUBL: 0.4000 mg | SUBLINGUAL_TABLET | SUBLINGUAL | Status: DC | PRN

## 2014-04-30 MED ORDER — HYDROCHLOROTHIAZIDE 25 MG PO TABS
12.5000 mg | ORAL_TABLET | Freq: Every day | ORAL | Status: DC
  Filled 2014-04-30 (×2): qty 0.5

## 2014-04-30 MED ORDER — NITROGLYCERIN 2 % TD OINT
1.0000 [in_us] | TOPICAL_OINTMENT | Freq: Once | TRANSDERMAL | Status: AC
  Administered 2014-04-30: 1 [in_us] via TOPICAL
  Filled 2014-04-30: qty 1

## 2014-04-30 MED ORDER — ASPIRIN 300 MG RE SUPP: 300.0000 mg | RECTAL | Status: AC

## 2014-04-30 MED ORDER — ESCITALOPRAM OXALATE 20 MG PO TABS
20.0000 mg | ORAL_TABLET | Freq: Every day | ORAL | Status: DC
  Administered 2014-05-01: 20 mg via ORAL
  Filled 2014-04-30 (×2): qty 1

## 2014-04-30 MED ORDER — ACETAMINOPHEN 500 MG PO TABS
1000.0000 mg | ORAL_TABLET | Freq: Four times a day (QID) | ORAL | Status: DC | PRN
  Administered 2014-04-30 – 2014-05-01 (×2): 1000 mg via ORAL
  Filled 2014-04-30 (×2): qty 2

## 2014-04-30 MED ORDER — CLOPIDOGREL BISULFATE 75 MG PO TABS
75.0000 mg | ORAL_TABLET | Freq: Every day | ORAL | Status: DC
  Administered 2014-05-01: 75 mg via ORAL
  Filled 2014-04-30: qty 1

## 2014-04-30 MED ORDER — ENOXAPARIN SODIUM 40 MG/0.4ML ~~LOC~~ SOLN
40.0000 mg | SUBCUTANEOUS | Status: DC
  Filled 2014-04-30 (×2): qty 0.4

## 2014-04-30 MED ORDER — SODIUM CHLORIDE 0.9 % IJ SOLN: 3.0000 mL | INTRAMUSCULAR | Status: DC | PRN

## 2014-04-30 MED ORDER — ATORVASTATIN CALCIUM 20 MG PO TABS
20.0000 mg | ORAL_TABLET | Freq: Every day | ORAL | Status: DC
Start: 2014-04-30 — End: 2014-05-01
  Filled 2014-04-30 (×2): qty 1

## 2014-04-30 NOTE — ED Notes (Signed)
Cardiologist at bedside.  

## 2014-04-30 NOTE — Telephone Encounter (Signed)
Upon arriving to cardiac rehab today pt c/o of chest discomfort.  I spoke with pt who c/o chest discomfort, light-headedness and left-side pain that comes and goes. She also states occasional pain in left arm and back of ribs. She states this has been occuring for last 1 hour - 1 1/2 hour. Pain is a 1-2 on 10 point scale. She has taken no NTG. Advised to take a NTG to see if this relieves pain. Recommendation to go to ED to be evaluated - with stated issues and MI/stent history.  Pt very reluctant to go and wanted to be seen in office. Advised coming into office would only delay treatment. I spoke with cardiac rehab and advised them of our recommendations.

## 2014-04-30 NOTE — ED Provider Notes (Signed)
CSN: [id number]     Arrival date & time 04/30/14  1043 History   First MD Initiated Contact with Patient 04/30/14 1102     Chief Complaint  Patient presents with  . Chest Pain     (Consider location/radiation/quality/duration/timing/severity/associated sxs/prior Treatment) HPI  This is a 73 year old female with history of hypertension, coronary artery disease status post 3 stents, hyperlipidemia who presents with chest pain. Patient reports she was on her way to cardiac rehabilitation this morning when she had onset of lightheadedness and nausea. She also reports left-sided chest pain that radiates into her left jaw and left arm. She states "I just don't feel good."  Patient currently reports 2/10 chest pain. Just reports low energy. She denies any shortness of breath or diaphoresis. She did take an 81 mg aspirin this morning. The chest pain radiates into her back.  Patient states that this is different than her prior chest pain leading to diagnosis of MI. Primary cardiologist is Dr. Burt Knack.  Past Medical History  Diagnosis Date  . Hypokalemia   . HTN (hypertension)   . Sleep apnea   . Endometrial polyp   . CAD (coronary artery disease)     a. NSTEMI 03/2012 ([facility] 03/27/12: pLAD 30%, mLAD 99%, mCFX 95%, mRCA 99% with thrombus, EF 65%);  s/p PTCA/DESx1 to prox-mid RCA 03/27/12 urgently in setting of hypotension/bradycardia, with staged PTCA/Evolve study stent to mid LAD & PTCA/Evolve study stent to prox LCx 03/29/12 ;   echo 03/28/12:EF 60%, Aortic sclerosis without AS, mild RVE, mild reduced RVSF    . Hyperglycemia   . Hyperlipidemia   . Atrial tachycardia     a. noted at cardiac rehab 5/13; event monitor ordered to assess for AFib  . MI, old    Past Surgical History  Procedure Laterality Date  . Cholecystectomy  1978  . Appendectomy  73 years old  . Tonsillectomy  73 years old  . Cesarean section  1984  . Carotid stent  04/2012    x3   Family History  Problem Relation Age of Onset  .  Coronary artery disease Mother     CABG at age 48  . Cancer Father     larynx   History  Substance Use Topics  . Smoking status: Former Smoker    Types: Cigarettes    Quit date: 04/19/1980  . Smokeless tobacco: Never Used  . Alcohol Use: Yes     Comment: socially   OB History   Grav Para Term Preterm Abortions TAB SAB Ect Mult Living                 Review of Systems  Constitutional: Negative for fever.  Respiratory: Positive for chest tightness. Negative for cough and shortness of breath.   Cardiovascular: Positive for chest pain.  Gastrointestinal: Positive for nausea. Negative for vomiting and abdominal pain.  Genitourinary: Negative for dysuria.  Musculoskeletal: Negative for back pain.  Skin: Negative for wound.  Neurological: Positive for light-headedness. Negative for weakness and headaches.  Psychiatric/Behavioral: Negative for confusion.  All other systems reviewed and are negative.     Allergies  Penicillins; Sulfonamide derivatives; and Zinc oxide  Home Medications   Prior to Admission medications   Medication Sig Start Date End Date Taking? Authorizing Provider  acetaminophen (TYLENOL) 500 MG tablet Take 1,000 mg by mouth every 6 (six) hours as needed. For pain    Historical Provider, MD  ALPRAZolam Duanne Moron) 0.5 MG tablet Take 1 tablet (0.5 mg total) by  mouth 2 (two) times daily as needed for sleep. 03/24/13   [name], MD  amLODipine (NORVASC) 5 MG tablet TAKE 1 TABLET (5 MG TOTAL) BY MOUTH DAILY. 01/27/14   [name], MD  aspirin 81 MG tablet Take 1 tablet (81 mg total) by mouth daily. 03/30/12   Dayna N Dunn, PA-C  atorvastatin (LIPITOR) 40 MG tablet  09/30/13   Historical Provider, MD  Calcium Carbonate-Vitamin D (CALTRATE 600+D) 600-400 MG-UNIT per tablet Take 1 tablet by mouth 2 (two) times daily.     Historical Provider, MD  clopidogrel (PLAVIX) 75 MG tablet TAKE 1 TABLET (75 MG TOTAL) BY MOUTH DAILY.    [name], MD  escitalopram (LEXAPRO)  20 MG tablet Take 1 tablet (20 mg total) by mouth daily. 03/28/14   [name], MD  FOLBIC 2.5-25-2 MG TABS TAKE 1 TABLET EVERY DAY 02/15/14   [name], MD  hydrochlorothiazide (HYDRODIURIL) 25 MG tablet TAKE 1/2 TABLET BY MOUTH DAILY. 01/13/14   [name], MD  HYDROcodone-acetaminophen [facility]) 10-325 MG per tablet Take 1 tablet by mouth every 8 (eight) hours as needed. 01/30/14   [name], MD  metoprolol (LOPRESSOR) 50 MG tablet TAKE 1 TABLET (50 MG TOTAL) BY MOUTH 2 (TWO) TIMES DAILY.    [name], MD  NEXIUM 40 MG capsule TAKE ONE CAPSULE BY MOUTH EVERY DAY 05/04/13   [name], MD  nitroGLYCERIN (NITROSTAT) 0.4 MG SL tablet Place 1 tablet (0.4 mg total) under the tongue every 5 (five) minutes x 3 doses as needed for chest pain. 10/18/13 10/19/14  [name], MD  rosuvastatin (CRESTOR) 10 MG tablet Take 1 tablet (10 mg total) by mouth daily. 10/18/13   [name], MD  temazepam (RESTORIL) 30 MG capsule Take 1 capsule (30 mg total) by mouth at bedtime as needed. 12/19/13   [name], MD   BP 142/93  Pulse 64  Temp(Src) 99 F (37.2 C)  Resp 16  Wt 159 lb (72.122 kg)  SpO2 94% Physical Exam  Nursing note and vitals reviewed. Constitutional: She is oriented to person, place, and time. She appears well-developed and well-nourished. No distress.  HENT:  Head: Normocephalic and atraumatic.  Eyes: Pupils are equal, round, and reactive to light.  Neck: Neck supple. No JVD present.  Cardiovascular: Normal rate, regular rhythm and normal heart sounds.   No murmur heard. Pulmonary/Chest: Effort normal and breath sounds normal. No respiratory distress. She has no wheezes.  Abdominal: Soft. There is no tenderness.  Musculoskeletal: She exhibits no edema.  Neurological: She is alert and oriented to person, place, and time.  Skin: Skin is warm and dry.  Psychiatric: She has a normal mood and affect.    ED Course  Procedures (including critical care time) Labs  Review Labs Reviewed  CBC WITH DIFFERENTIAL - Abnormal; Notable for the following:    Platelets 124 (*)    All other components within normal limits  BASIC METABOLIC PANEL - Abnormal; Notable for the following:    Glucose, Bld 107 (*)    GFR calc non Af Amer 83 (*)    All other components within normal limits  I-STAT TROPOININ, ED    Imaging Review Dg Chest 2 View  04/30/2014   CLINICAL DATA:  Left upper chest pain, history of hypertension.  EXAM: CHEST  2 VIEW  COMPARISON:  DG CHEST 2 VIEW dated 04/24/2010  FINDINGS: The heart size and mediastinal contours are within normal limits. Both  lungs are clear with the exception of minimal crowding of the lung markings at the lung bases. No focal pulmonary opacity. The visualized skeletal structures are unremarkable.  IMPRESSION: No active cardiopulmonary disease.   Electronically Signed   By: Conchita Paris M.D.   On: 04/30/2014 11:57     EKG Interpretation None      EKG independently reviewed by myself: Normal sinus rhythm with a rate of 60, Q waves more prominent in V2 and V3 when compared to prior  MDM   Final diagnoses:  Chest pain    Patient presents with chest pain, lightheadedness, and nausea. She is nontoxic on exam. Vital signs within normal limits. History of coronary artery disease. Patient was given full dose aspirin. EKG shows no evidence of ST elevation; however, Q waves are more distinct in lead V3 when compared to prior. Lab work and chest x-ray were obtained. Nitroglycerin paste was applied and the patient had improvement of her chest pain. Troponin initially negative. Will consult cardiology for further management.  Patient to be admitted for obs.    Merryl Hacker, MD 04/30/14 1351

## 2014-04-30 NOTE — Progress Notes (Addendum)
Pt arrived at cardiac rehab c/o chest discomfort off and on this morning.  Pt describes as sharp in left breast and back associated with lightheadedness.  Pt denies as her anginal equivalent.   Rates 1-2/10.  Discomfort comes and goes.   BP-130/80 O2 sat-98%. Pt did not exercise.   PC to Dr. Antionette Char office, spoke to triage nurse who advised pt to present to ED. Pt taken to ED via wheelchair in stable condition.  Pt spouse notified of pt transfer to ED.

## 2014-04-30 NOTE — H&P (Signed)
Patient ID: Carrie Chung MRN: [id number], DOB/AGE: 1941/02/13   Admit date: 04/30/2014   Primary Physician: [name], MD Primary Cardiologist: Dr. Burt Knack  Pt. Profile: Carrie Chung is a 73 y.o. female with a history of HTN, OSA, atrial tachycardia, CAD s/p NSTEMI w/ multivessel stenting (2013) who presented to [facility] ED from cardiac rehab today with chest pain x 2 hours.  In 2013 she presented with a NSTEMI and underwent LHC and s/p DES to RCA followed by staged PCI with DES x2 to LAD and LCx. LV function was normal with an EF of 60% at that time. She had a normal stress Myoview in December 2013. She does have a history of chronic intermittent chest pain that is non radiating and short lived.  Ms Delval was in her usual state of health until this morning when she "just didn't feel good." She arrived at cardiac rehab today and reported chest discomfort off and on since 9am this morning. She described it as a sharp pain in her left breast that radiated to her left arm. It was associated with lightheadedness and nausea. Rates 1-2/10. Discomfort comes and goes. She is very adamant that this is not at all like her previous cardiac chest pain; however, after further questioning she admits that it may be similar but much less intense. It was relieved by NTG ointment and now she has just residual "twinge" in chest. She denies exertional SOB or exertional chest pain. Recently returned from [place] to visit family.   Problem List  Past Medical History  Diagnosis Date  . Hypokalemia   . HTN (hypertension)     essential  . Sleep apnea   . Endometrial polyp   . CAD (coronary artery disease)     NSTEMI 03/2012 ([place] 03/27/12: pLAD 30%, mLAD 99%, mCFX 95%, mRCA 99% with thrombus, EF 65%);  s/p PTCA/DESx1 to prox-mid RCA 03/27/12 urgently in setting of hypotension/bradycardia, with staged PTCA/Evolve study stent to mid LAD & PTCA/Evolve study stent to prox LCx 03/29/12 ;   echo 03/28/12:EF 60%,  Aortic sclerosis without AS, mild RVE, mild reduced RVSF    . Hyperglycemia     A1C 5.7 03/2012  . Hyperlipidemia   . Atrial tachycardia     noted at cardiac rehab 5/13; event monitor ordered to assess for AFib  . MI, old     Past Surgical History  Procedure Laterality Date  . Cholecystectomy  1978  . Appendectomy  73 years old  . Tonsillectomy  73 years old  . Cesarean section  1984  . Carotid stent  04/2012    x3     Allergies  Allergies  Allergen Reactions  . Penicillins Hives and Swelling  . Sulfonamide Derivatives Hives and Swelling  . Zinc Oxide Rash    Home Medications  Prior to Admission medications   Medication Sig Start Date End Date Taking? Authorizing Provider  acetaminophen (TYLENOL) 500 MG tablet Take 1,000 mg by mouth every 6 (six) hours as needed. For pain    Historical Provider, MD  ALPRAZolam Duanne Moron) 0.5 MG tablet Take 1 tablet (0.5 mg total) by mouth 2 (two) times daily as needed for sleep. 03/24/13   [name], MD  amLODipine (NORVASC) 5 MG tablet TAKE 1 TABLET (5 MG TOTAL) BY MOUTH DAILY. 01/27/14   [name], MD  aspirin 81 MG tablet Take 1 tablet (81 mg total) by mouth daily. 03/30/12   Dayna N Dunn, PA-C  Calcium Carbonate-Vitamin  D (CALTRATE 600+D) 600-400 MG-UNIT per tablet Take 1 tablet by mouth 2 (two) times daily.     Historical Provider, MD  clopidogrel (PLAVIX) 75 MG tablet TAKE 1 TABLET (75 MG TOTAL) BY MOUTH DAILY.    Tonny Bollman, MD  escitalopram (LEXAPRO) 20 MG tablet Take 1 tablet (20 mg total) by mouth daily. 03/28/14   Margaree Mackintosh, MD  FOLBIC 2.5-25-2 MG TABS TAKE 1 TABLET EVERY DAY 02/15/14   Margaree Mackintosh, MD  hydrochlorothiazide (HYDRODIURIL) 25 MG tablet TAKE 1/2 TABLET BY MOUTH DAILY. 01/13/14   Tonny Bollman, MD  HYDROcodone-acetaminophen [facility]) 10-325 MG per tablet Take 1 tablet by mouth every 8 (eight) hours as needed. 01/30/14   Margaree Mackintosh, MD  metoprolol (LOPRESSOR) 50 MG tablet TAKE 1 TABLET (50 MG TOTAL) BY MOUTH 2 (TWO)  TIMES DAILY.    Tonny Bollman, MD  NEXIUM 40 MG capsule TAKE ONE CAPSULE BY MOUTH EVERY DAY 05/04/13   Margaree Mackintosh, MD  nitroGLYCERIN (NITROSTAT) 0.4 MG SL tablet Place 1 tablet (0.4 mg total) under the tongue every 5 (five) minutes x 3 doses as needed for chest pain. 10/18/13 10/19/14  Tonny Bollman, MD  rosuvastatin (CRESTOR) 10 MG tablet Take 1 tablet (10 mg total) by mouth daily. 10/18/13   Tonny Bollman, MD  temazepam (RESTORIL) 30 MG capsule Take 1 capsule (30 mg total) by mouth at bedtime as needed. 12/19/13   Margaree Mackintosh, MD    Family History  Family History  Problem Relation Age of Onset  . Coronary artery disease Mother     CABG at age 3  . Cancer Father     larynx    Social History  History   Social History  . Marital Status: Married    Spouse Name: N/A    Number of Children: N/A  . Years of Education: N/A   Occupational History  . Retired Engineer, civil (consulting)    Social History Main Topics  . Smoking status: Former Smoker    Types: Cigarettes    Quit date: 04/19/1980  . Smokeless tobacco: Never Used  . Alcohol Use: Yes     Comment: socially  . Drug Use: No  . Sexual Activity: Not on file   Other Topics Concern  . Not on file   Social History Narrative  . No narrative on file     Review of Systems General:  No chills, fever, night sweats or weight changes. +++ fatigue Cardiovascular:  +++chest pain, NO dyspnea on exertion, edema, orthopnea, palpitations, paroxysmal nocturnal dyspnea. Dermatological: No rash, lesions/masses Respiratory: No cough, dyspnea Urologic: No hematuria, dysuria Abdominal:   +++ nausea, No vomiting, diarrhea, bright red blood per rectum, melena, or hematemesis Neurologic:  No visual changes, wkns, changes in mental status All other systems reviewed and are otherwise negative except as noted above.  Physical Exam  Blood pressure 132/64, pulse 64, temperature 99 F (37.2 C), resp. rate 16, weight 159 lb (72.122 kg), SpO2 94.00%.    General: Pleasant, NAD Psych: Normal affect. Neuro: Alert and oriented X 3. Moves all extremities spontaneously. HEENT: Normal  Neck: Supple without bruits or JVD. Lungs:  Resp regular and unlabored, CTA. Heart: RRR no s3, s4, or murmurs. Abdomen: Soft, non-tender, non-distended, BS + x 4.  Extremities: No clubbing, cyanosis or edema. DP/PT/Radials 2+ and equal bilaterally.  Labs   Lab Results  Component Value Date   WBC 4.5 04/30/2014   HGB 14.6 04/30/2014   HCT 43.3 04/30/2014  MCV 93.9 04/30/2014   PLT 124* 04/30/2014    Recent Labs Lab 04/30/14 1130  NA 142  K 4.1  CL 100  CO2 30  BUN 14  CREATININE 0.72  CALCIUM 9.6  GLUCOSE 107*   Lab Results  Component Value Date   CHOL 158 03/08/2014   HDL 71.10 03/08/2014   LDLCALC 59 03/08/2014   TRIG 139.0 03/08/2014     Radiology/Studies  Dg Chest 2 View  04/30/2014   CLINICAL DATA:  Left upper chest pain, history of hypertension.  EXAM: CHEST  2 VIEW  COMPARISON:  DG CHEST 2 VIEW dated 04/24/2010  FINDINGS: The heart size and mediastinal contours are within normal limits. Both lungs are clear with the exception of minimal crowding of the lung markings at the lung bases. No focal pulmonary opacity. The visualized skeletal structures are unremarkable.  IMPRESSION: No active cardiopulmonary disease.     ECG  NSR, old ant infarct.    ECHO: Study Date: 03/28/2012 LV EF: 60% Study Conclusions - Left ventricle: The cavity size was normal. Wall thickness was normal. The estimated ejection fraction was 60%. Wall motion was normal; there were no regional wall motion abnormalities. - Aortic valve: Sclerosis without stenosis. - Mitral valve: Calcified annulus. No significant regurgitation. - Right ventricle: The cavity size was mildly dilated. Systolic function was mildly reduced.   Cardiac Catheterization Operative Report  03/26/2013 Procedure Performed:  1. Left Heart Catheterization 2. Selective Coronary  Angiography 3. Left ventricular angiogram 4. PTCA/DES x 1 proximal to mid RCA Operator: Lauree Chandler, MD  Arterial access site: Right radial artery.  Indication: The patient was admitted with chest pain. Her initial cardiac markers have been negative but she has had recurrent chest pain and question of transient inferior ST elevation on telemetry. EKG with non-specific ST segment changes. Over the last hour, she has had episodes of sinus bradycardia with ongoing nausea. Urgent cath to exclude CAD. This was not a STEMI.  Procedure Details:  The risks, benefits, complications, treatment options, and expected outcomes were discussed with the patient. The patient and/or family concurred with the proposed plan, giving informed consent. The patient was brought to the cath lab after IV hydration was begun. The patient was further sedated with Versed. The right wrist was assessed with an Allens test which was positive. The right wrist was prepped and draped in a sterile fashion. 1% lidocaine was used for local anesthesia. Using the modified Seldinger access technique, a 6 French sheath was placed in the right radial artery. 3 mg Verapamil was given through the sheath. 3500 units IV heparin was given. Standard diagnostic catheters were used to perform selective coronary angiography. 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 The RCA was engaged with a 6 Fr JR-4 guiding catheter. She was given a bolus of Angiomax and a drip was started. She was also given Brilinta 180 mg po x 1. When the ACT was greater than 200, a Cougar IC wire was advanced down the RCA. A 2.5 x 20 mm balloon was used to pre-dilate the stenosis in the proximal to mid RCA. I then carefully positioned and deployed a 3.0 x 28 mm Promus Element DES extending from the proximal vessel into the mid vessel. At this time, she had a brief period of  asystole. Atropine 1mg  given with return of sinus rhythm, rate 100 bpm. A 3.25 x 20  mm  balloon was used to post-dilate the stent. There was excellent flow into the distal vessel. The stenosis was taken from 99% down to 0%. A pigtail catheter was used to perform a left ventricular angiogram. The sheath was removed from the right radial artery and a hemostasis band was applied at the arteriotomy site on the right wrist.  There were no immediate complications. The patient was taken to the recovery area in stable condition.  Hemodynamic Findings:  Central aortic pressure: 122/70  Left ventricular pressure: 125/8/8  Angiographic Findings:  Left main: No obstructive disease.  Left Anterior Descending Artery: Large vessel that courses to the apex. The proximal segment has diffuse 30% stenosis. The mid vessel has a 99% stenosis just after a small caliber diagonal branch.  Circumflex Artery: Moderate sized vessel with 95% mid stenosis. There is a moderate sized bifurcating OM branch with mild plaque disease.  Right Coronary Artery: Large, dominant vessel with 99% mid stenosis with hazy filling defect c/w thrombus.  Left Ventricular Angiogram: LVEF 65%.  Impression:  1. NSTEMI with severe stenosis mid RCA with thrombus now s/p successful PTCA with DES x 1 mid RCA  2. Residual stenosis in the mid LAD and mid Circumflex.  3. Preserved LV systolic function.  Recommendations: Will need ASA and Brilinta for at least one year. Will continue Angiomax for two hours at reduced dose. Will add statin. She will need to be started on a beta blocker as tolerated. Will not start tonight with bradycardia.  Complications: None. The patient tolerated the procedure well.     CARDIAC CATH NOTE  DOB: 10/05/1941  Procedure: PTCA and stenting of the mid LAD and proximal left circumflex  Indication: Non-ST elevation MI. This is a 73 year old woman who presented with non-ST elevation infarction and ongoing angina refractory  to aggressive medical therapy. She underwent PCI of a critical lesion in the mid right coronary artery. She was noted to have severe stenoses in focal areas of the mid LAD and proximal circumflex. She presents today for staged intervention. She has been adequately preloaded with ticagrelor. The patient was randomized as part of the Evolve II study, comparing the Promus Element stent to the Synergy stent (biodegradable polymer) in 1:1 fashion.  Procedural Details: The right wrist was prepped, draped, and anesthetized with 1% lidocaine. Using the modified Seldinger technique, a 6 Fr sheath was introduced into the radial artery. 2.5 mg of nicardipine was administered through the radial sheath. Weight-based bivalirudin was given for anticoagulation. Once a therapeutic ACT was achieved, a 6 [place] XB LAD 3.0 guide catheter was inserted. Attention was first turned to the mid LAD. There is a 95-99% stenosis just after the second diagonal. A Cougar coronary guidewire was used to cross the lesion. The wire was occlusive with TIMI 1 flow after wiring the lesion. The lesion was predilated with a 2.0 x 12 mm balloon. The lesion was then stented with a 2.5 x 12 mm Evolve study stent. The stent was postdilated with a 2.75x8 noncompliant balloon. Following PCI, there was 0% residual stenosis and TIMI-3 flow. Attention was then turned to the left circumflex. There is a severe 95% stenosis in the proximal vessel. The wire was redirected across the lesion into the first obtuse marginal branch. The vessel was predilated with the same 2.0 x 12 mm balloon taken to 8 atmospheres. The lesion was very focal and I elected to treat it with a 2.5 x 8 mm Evolve Study stent. The stent was then postdilated  with a 2.75 x 6 mm noncompliant balloon taken to 16 atmospheres with an excellent final result. There is 0% residual stenosis and TIMI-3 flow. Final angiography confirmed an excellent result. The patient tolerated the procedure well. There  were no immediate procedural complications. A TR band was used for radial hemostasis. The patient was transferred to the post catheterization recovery area for further monitoring.  Lesion Data (lesion 1)  Vessel: LAD  Percent stenosis (pre): 99  TIMI-flow (pre): 3  Stent: 2.5x12 mm Evolve II study stent (DES)  Percent stenosis (post): 0  TIMI-flow (post): 3  Lesion Data (lesion 2)  Vessel: LCx  Percent stenosis (pre): 90  TIMI-flow (pre): 3  Stent: 2.5x8 mm Evolve II study stent (DES)  Percent stenosis (post): 0  TIMI-flow (post): 3  Conclusions: Successful 2 vessel PCI as above  Recommendations: ASA 81 mg and Brilinta 90 mg BID x minimum 12 months. Aggressive risk reduction.   ASSESSMENT AND PLAN  Laylah Riga Tatsch is a 73 y.o. female with a history of HTN, OSA, atrial tachycardia, CAD s/p NSTEMI w/ multivessel stenting (2013) who presented to [facility] ED from cardiac rehab today with chest pain x 2 hours.  Chest pain- in the setting of known CAD.  -- Troponin x1 negative, ECG with no acute ST or TW changes -- Will admit to telemtry for observation -- Cycle Troponin and serial ECGs -- Normal ECHO in 03/2012 with EF 60%. RV mild dilation with mildly reduced systolic function  -- Recent travel, will check D-dimer  -- Continue ASA/Plavix, metoprolol, rosuvastatin -- NPO after midnight. Plan for stress echo in the AM if she rules out. Hold BB.  -- Continue nitro paste for sx control. Will also help with BP.  HTN- continue home amlodipine and HCTZ  OSA- CPAP  Hx of atrial tachycardia- noted once in cardiac rehab. None currently. Continue to monitor  HLD- Lipid panel on 03/07/14 with good control. Continue statin   Signed, Perry Mount, PA-C 04/30/2014, 12:42 PM  Pager [phone] Patient seen and examined and history reviewed. Agree with above findings and plan. 73 yo WF s/p multivessel stenting in April 2014 presents with symptoms of chest pain during cardiac Rehab today. Patient  describes twinges of chest pain intermittently since then. She had a normal stress Myoview in 12/13. Today at cardiac Rehab she developed some nausea and mild lightheadedness. She then developed "mild" left precordial chest pain radiating into her neck and left arm. This waxed and waned over 2 hours. She did not take sl Ntg. In the ED she was started on nitrolpaste and her pain resolved in about 30 minutes. She states she feels fine now and wants to go home. Exam reveals normal lung and cardiac exam. No edema or phlebitis. Ecg shows no acute changes although R wave progression is blunted. Initial troponin is negative. Recommend overnight observation. Will cycle cardiac enzymes and Ecg. With recent travel will check d-dimer. DVT prophylaxis with lovenox. If above evaluation is negative will plan stress Echo in am. (previously able to achieve adequate exercise level and HR target). Will hold pm metoprolol in anticipation of stress test. Continue other meds.   Ander Slade [facility] 04/30/2014 2:07 PM

## 2014-04-30 NOTE — ED Notes (Signed)
STARTTED TO FEEL LIGHTHEADNESS AND THEN CP LEFT SIDE IN RIBS AND RADS TO LEFT ARM MILD  HAD A BIT OF NAUSEA STATES DID   Have breakfast

## 2014-05-01 ENCOUNTER — Other Ambulatory Visit: Payer: Self-pay

## 2014-05-01 DIAGNOSIS — I214 Non-ST elevation (NSTEMI) myocardial infarction: Secondary | ICD-10-CM | POA: Diagnosis not present

## 2014-05-01 DIAGNOSIS — Z5189 Encounter for other specified aftercare: Secondary | ICD-10-CM | POA: Diagnosis not present

## 2014-05-01 DIAGNOSIS — R079 Chest pain, unspecified: Secondary | ICD-10-CM

## 2014-05-01 DIAGNOSIS — I2510 Atherosclerotic heart disease of native coronary artery without angina pectoris: Secondary | ICD-10-CM | POA: Diagnosis not present

## 2014-05-01 DIAGNOSIS — Z9861 Coronary angioplasty status: Secondary | ICD-10-CM | POA: Diagnosis not present

## 2014-05-01 LAB — COMPREHENSIVE METABOLIC PANEL
ALBUMIN: 3.9 g/dL (ref 3.5–5.2)
ALT: 14 U/L (ref 0–35)
AST: 23 U/L (ref 0–37)
Alkaline Phosphatase: 72 U/L (ref 39–117)
BUN: 10 mg/dL (ref 6–23)
CALCIUM: 9.8 mg/dL (ref 8.4–10.5)
CO2: 31 mEq/L (ref 19–32)
CREATININE: 0.71 mg/dL (ref 0.50–1.10)
Chloride: 101 mEq/L (ref 96–112)
GFR calc Af Amer: >90 mL/min (ref >90)
GFR calc non Af Amer: 84 mL/min — ABNORMAL LOW (ref >90)
Glucose, Bld: 114 mg/dL — ABNORMAL HIGH (ref 70–99)
Potassium: 3.4 mEq/L — ABNORMAL LOW (ref 3.7–5.3)
Sodium: 146 mEq/L (ref 137–147)
TOTAL PROTEIN: 7.1 g/dL (ref 6.0–8.3)
Total Bilirubin: 0.7 mg/dL (ref 0.3–1.2)

## 2014-05-01 LAB — CBC
HEMATOCRIT: 42.5 % (ref 36.0–46.0)
HEMOGLOBIN: 14.5 g/dL (ref 12.0–15.0)
MCH: 31.7 pg (ref 26.0–34.0)
MCHC: 34.1 g/dL (ref 30.0–36.0)
MCV: 93 fL (ref 78.0–100.0)
Platelets: 135 10*3/uL — ABNORMAL LOW (ref 150–400)
RBC: 4.57 MIL/uL (ref 3.87–5.11)
RDW: 13.5 % (ref 11.5–15.5)
WBC: 4 10*3/uL (ref 4.0–10.5)

## 2014-05-01 LAB — LIPID PANEL
Cholesterol: 172 mg/dL (ref 0–200)
HDL: 92 mg/dL (ref >39)
LDL CALC: 57 mg/dL (ref 0–99)
TRIGLYCERIDES: 116 mg/dL (ref <150)
Total CHOL/HDL Ratio: 1.9 RATIO
VLDL: 23 mg/dL (ref 0–40)

## 2014-05-01 LAB — PROTIME-INR
INR: 1.09 (ref 0.00–1.49)
Prothrombin Time: 13.9 seconds (ref 11.6–15.2)

## 2014-05-01 LAB — TROPONIN I: Troponin I: <0.3 ng/mL (ref <0.30)

## 2014-05-01 MED ORDER — HYDROCHLOROTHIAZIDE 12.5 MG PO CAPS
12.5000 mg | ORAL_CAPSULE | Freq: Every day | ORAL | Status: DC
  Administered 2014-05-01: 12.5 mg via ORAL
  Filled 2014-05-01: qty 1

## 2014-05-01 MED ORDER — POTASSIUM CHLORIDE CRYS ER 20 MEQ PO TBCR
40.0000 meq | EXTENDED_RELEASE_TABLET | Freq: Once | ORAL | Status: AC
  Administered 2014-05-01: 40 meq via ORAL
  Filled 2014-05-01: qty 2

## 2014-05-01 NOTE — Progress Notes (Signed)
  Echocardiogram Echocardiogram Stress Test has been performed.  Carrie Chung 05/01/2014, 10:50 AM

## 2014-05-01 NOTE — Discharge Summary (Signed)
CARDIOLOGY DISCHARGE SUMMARY   Patient ID: Carrie Chung MRN: [id number] DOB/AGE: May 14, 1941 73 y.o.  Admit date: 04/30/2014 Discharge date: 05/01/2014  PCP: [name], MD Primary Cardiologist: Dr. Burt Knack  Primary Discharge Diagnosis:  Precordial pain, upper left chest  Secondary Discharge Diagnosis:  Hypokalemia  Procedures: GXT Echo, LE Venous Dopplers  Hospital Course: Carrie Chung is a 73 y.o. female with a history of CAD. She was at cardiac rehab (maintenance) and had onset of upper left chest pain, some left arm discomfort, HA and nausea. She told the staff, took a SL NTG with relief and was sent to the ER. Her symptoms improved with the nitrates and she was admitted for further evaluation and treatment.   Her cardiac enzymes were negative for MI. Her ECG was not acute. Her D-dimer was elevated, her potassium was slightly low and her platelets were slightly low. She had a recent transatlantic flight, so LE dopplers were performed but were negative for DVT. Her potassium was supplemented. She has had low platelets before, and on admission they were 124, lower than usual for her. She has no bleeding issues. Her BNP was slightly elevated, but she had no signs/symptoms of volume overload. Her chest Xray showed no acute disease.   Once she ruled out for an MI, a GXT echocardiogram was performed. She reached target HR in stage I, but exercise continued into stage II. She had some SOB, but no chest pain with the exertion, even at maximal heart rates.   On 05/12, she was seen by Dr. Debara Pickett and all data were reviewed. Once the stress echo was normal and her LE Dopplers showed no DVT, no further inpatient workup was indicated. She is considered stable for discharge, to follow up as an outpatient. She is OK to return to cardiac rehab and is encouraged to seek medical help if she gets additional symptoms.  Labs:   Lab Results  Component Value Date   WBC 4.0 05/01/2014   HGB  14.5 05/01/2014   HCT 42.5 05/01/2014   MCV 93.0 05/01/2014   PLT 135* 05/01/2014     Recent Labs Lab 05/01/14 0620  NA 146  K 3.4*  CL 101  CO2 31  BUN 10  CREATININE 0.71  CALCIUM 9.8  PROT 7.1  BILITOT 0.7  ALKPHOS 72  ALT 14  AST 23  GLUCOSE 114*    Recent Labs  04/30/14 1838 04/30/14 2247 05/01/14 0620  TROPONINI <0.30 <0.30 <0.30   Lipid Panel     Component Value Date/Time   CHOL 172 05/01/2014 0620   TRIG 116 05/01/2014 0620   HDL 92 05/01/2014 0620   CHOLHDL 1.9 05/01/2014 0620   VLDL 23 05/01/2014 0620   LDLCALC 57 05/01/2014 0620    Pro B Natriuretic peptide (BNP)  Date/Time Value Ref Range Status  04/30/2014  6:38 PM 350.7* 0 - 125 pg/mL Final    Recent Labs  05/01/14 0620  INR 1.09      Radiology: Dg Chest 2 View 04/30/2014   CLINICAL DATA:  Left upper chest pain, history of hypertension.  EXAM: CHEST  2 VIEW  COMPARISON:  DG CHEST 2 VIEW dated 04/24/2010  FINDINGS: The heart size and mediastinal contours are within normal limits. Both lungs are clear with the exception of minimal crowding of the lung markings at the lung bases. No focal pulmonary opacity. The visualized skeletal structures are unremarkable.  IMPRESSION: No active cardiopulmonary disease.   Electronically Signed  By: Conchita Paris M.D.   On: 04/30/2014 11:57   EKG: 05/01/2014 SR Vent. rate 73 BPM PR interval 160 ms QRS duration 84 ms QT/QTc 422/464 ms P-R-T axes 61 2 30  Echo: 05/01/2014 Study Conclusions - Stress ECG conclusions: The stress ECG was normal. - Staged echo: Normal echo stress Impressions: - Normal study after maximal exercise.  FOLLOW UP PLANS AND APPOINTMENTS Allergies  Allergen Reactions  . Penicillins Hives and Swelling  . Sulfonamide Derivatives Hives and Swelling  . Zinc Oxide Rash     Medication List         acetaminophen 500 MG tablet  Commonly known as:  TYLENOL  - Take 1,000 mg by mouth every 6 (six) hours as needed for moderate pain. For  pain  -      ALPRAZolam 0.5 MG tablet  Commonly known as:  XANAX  Take 1 tablet (0.5 mg total) by mouth 2 (two) times daily as needed for sleep.     amLODipine 5 MG tablet  Commonly known as:  NORVASC  Take 5 mg by mouth daily.     aspirin 81 MG tablet  Take 1 tablet (81 mg total) by mouth daily.     CALTRATE 600+D 600-400 MG-UNIT per tablet  Generic drug:  Calcium Carbonate-Vitamin D  Take 1 tablet by mouth 2 (two) times daily.     clopidogrel 75 MG tablet  Commonly known as:  PLAVIX  Take 75 mg by mouth daily with breakfast.     escitalopram 20 MG tablet  Commonly known as:  LEXAPRO  Take 1 tablet (20 mg total) by mouth daily.     esomeprazole 40 MG capsule  Commonly known as:  NEXIUM  Take 40 mg by mouth daily at 12 noon.     FOLBIC 2.5-25-2 MG Tabs  Generic drug:  folic acid-pyridoxine-cyancobalamin  Take 1 tablet by mouth daily.     hydrochlorothiazide 25 MG tablet  Commonly known as:  HYDRODIURIL  Take 12.5 mg by mouth daily.     HYDROcodone-acetaminophen 10-325 MG per tablet  Commonly known as:  NORCO  Take 1 tablet by mouth every 8 (eight) hours as needed for severe pain.     metoprolol 50 MG tablet  Commonly known as:  LOPRESSOR  Take 50 mg by mouth 2 (two) times daily.     nitroGLYCERIN 0.4 MG SL tablet  Commonly known as:  NITROSTAT  Place 1 tablet (0.4 mg total) under the tongue every 5 (five) minutes x 3 doses as needed for chest pain.     rosuvastatin 10 MG tablet  Commonly known as:  CRESTOR  Take 1 tablet (10 mg total) by mouth daily.     temazepam 30 MG capsule  Commonly known as:  RESTORIL  Take 30 mg by mouth at bedtime as needed for sleep.        Discharge Orders   Future Appointments Provider Department Dept Phone   05/02/2014 9:45 AM [facility] [place] [phone]   05/04/2014 9:45 AM [facility] [place] [phone]    05/07/2014 9:45 AM [facility] [place] [phone]   05/09/2014 9:45 AM [facility] [place] [phone]   05/11/2014 9:45 AM [facility] [place] [phone]   05/16/2014 9:45 AM [facility] [place] [phone]   05/18/2014 9:45 AM  [facility] [place] [phone]   05/21/2014 9:45 AM [facility] [place] [phone]   05/23/2014 9:45 AM [facility] [place] [phone]   05/25/2014 9:45 AM [facility] [place] [phone]   05/28/2014 9:45 AM [facility] [place] [phone]   05/30/2014 9:45 AM [facility] [place] [phone]   06/01/2014 9:45 AM [facility] [place] [phone]   06/04/2014 9:45 AM [facility] [place] [phone]   06/06/2014 9:45 AM [facility] [place] [phone]   06/08/2014 10:45 AM [name], MD [place] Office [phone]   06/11/2014 9:45 AM [facility] [place] [phone]   06/13/2014 9:45 AM [facility] [place] [phone]   06/15/2014 9:45 AM [facility] [place] [phone]   06/18/2014 9:45 AM [facility] [place] [phone]   06/20/2014 9:45 AM [facility] [place] [phone]   06/25/2014 9:45 AM [facility] [place] [phone]   06/27/2014 9:45 AM [facility] [place] [phone]   06/29/2014 9:45 AM [facility] [place] [phone]   07/02/2014 9:45 AM [facility] [place] [phone]   07/04/2014 9:45 AM [facility] [place] [phone]   07/06/2014 9:45 AM [facility] [place] [phone]   07/09/2014 9:45 AM [facility] [place] [phone]   07/11/2014 9:45 AM [facility] [place] [phone]   07/13/2014 9:45 AM [facility] [place] [phone]   07/16/2014 9:45 AM [facility] [place] [phone]   07/18/2014 9:45 AM [facility] [place] [phone]   07/20/2014 9:45 AM [facility] [place] [phone]   Future Orders Complete By Expires   Diet - low sodium heart healthy  As directed    Increase activity slowly  As directed        BRING ALL MEDICATIONS WITH YOU TO FOLLOW UP APPOINTMENTS  Time spent with patient to include physician time: 41 min Signed: Lonn Georgia, PA-C 05/01/2014, 5:11 PM Co-Sign MD

## 2014-05-01 NOTE — Progress Notes (Signed)
Bilateral lower extremity venous duplex:  No evidence of DVT, superficial thrombosis, or Baker's Cyst.   

## 2014-05-01 NOTE — Progress Notes (Signed)
Pt. Seen and examined. Agree with the NP/PA-C note as written.  Chest pain has resolved - she has stress echo this am -awaiting results. A d-dimer was performed yesterday which was mildly positive. My pretest probability of PE is low, although she did have LE swelling and a recent transatlantic plane flight. Discussed further work-up with her and her husband who is a Pension scheme manager. Unfortunately, she had her exercise test before we could "exclude" PE - but performed well, not as one would expect who has a PE.  We discussed options including CTPA and V/Q scan - he wishes to be conservative and check LE dopplers.  Pixie Casino, MD, [facility] Attending Cardiologist [place]

## 2014-05-01 NOTE — Progress Notes (Signed)
Patient Name: Carrie Chung Date of Encounter: 05/01/2014  Active Problems:   Chest pain    Patient Profile: 73 yo female w/ hx CAD admitted w/ chest pain.  SUBJECTIVE: No more chest pain. No SOB  OBJECTIVE Filed Vitals:   04/30/14 1635 04/30/14 2055 05/01/14 0521 05/01/14 0525  BP: 152/64 156/70 171/63 152/71  Pulse:  80 82   Temp: 98.4 F (36.9 C) 97.9 F (36.6 C) 98 F (36.7 C)   TempSrc: Oral Oral Oral   Resp: 16 18 18    Height: 5\' 2"  (1.575 m)     Weight: 158 lb 1.1 oz (71.7 kg)  157 lb 6.4 oz (71.396 kg)   SpO2: 94% 98% 97%     Intake/Output Summary (Last 24 hours) at 05/01/14 1028 Last data filed at 05/01/14 0522  Gross per 24 hour  Intake    240 ml  Output   1700 ml  Net  -1460 ml   Filed Weights   04/30/14 1055 04/30/14 1635 05/01/14 0521  Weight: 159 lb (72.122 kg) 158 lb 1.1 oz (71.7 kg) 157 lb 6.4 oz (71.396 kg)    PHYSICAL EXAM General: Well developed, well nourished, female in no acute distress. Head: Normocephalic, atraumatic.  Neck: Supple without bruits, JVD not elevated. Lungs:  Resp regular and unlabored, CTA. Heart: RRR, S1, S2, no S3, S4, or murmur; no rub. Abdomen: Soft, non-tender, non-distended, BS + x 4.  Extremities: No clubbing, cyanosis, no edema.  Neuro: Alert and oriented X 3. Moves all extremities spontaneously. Psych: Normal affect.  LABS: CBC: Recent Labs  04/30/14 1130 05/01/14 0620  WBC 4.5 4.0  NEUTROABS 2.9  --   HGB 14.6 14.5  HCT 43.3 42.5  MCV 93.9 93.0  PLT 124* 135*   INR: Recent Labs  05/01/14 0620  INR 5.63   Basic Metabolic Panel: Recent Labs  04/30/14 1130 04/30/14 1838 05/01/14 0620  NA 142  --  146  K 4.1  --  3.4*  CL 100  --  101  CO2 30  --  31  GLUCOSE 107*  --  114*  BUN 14  --  10  CREATININE 0.72  --  0.71  CALCIUM 9.6  --  9.8  MG  --  1.7  --    Liver Function Tests: Recent Labs  05/01/14 0620  AST 23  ALT 14  ALKPHOS 72  BILITOT 0.7  PROT 7.1  ALBUMIN  3.9   Cardiac Enzymes: Recent Labs  04/30/14 1838 04/30/14 2247 05/01/14 0620  TROPONINI <0.30 <0.30 <0.30    Recent Labs  04/30/14 1145  TROPIPOC 0.00   BNP: Pro B Natriuretic peptide (BNP)  Date/Time Value Ref Range Status  04/30/2014  6:38 PM 350.7* 0 - 125 pg/mL Final   D-dimer: Recent Labs  04/30/14 1130  DDIMER 0.61*   Hemoglobin A1C: Recent Labs  04/30/14 1838  HGBA1C 5.7*   Fasting Lipid Panel: Recent Labs  05/01/14 0620  CHOL 172  HDL 92  LDLCALC 57  TRIG 116  CHOLHDL 1.9   Thyroid Function Tests: Recent Labs  04/30/14 1838  TSH 3.100   Anemia Panel:No results found for this basename: VITAMINB12, FOLATE, FERRITIN, TIBC, IRON, RETICCTPCT,  in the last 72 hours  TELE: SR (seen in echo)  Radiology/Studies: Dg Chest 2 View  04/30/2014   CLINICAL DATA:  Left upper chest pain, history of hypertension.  EXAM: CHEST  2 VIEW  COMPARISON:  DG CHEST 2 VIEW dated  04/24/2010  FINDINGS: The heart size and mediastinal contours are within normal limits. Both lungs are clear with the exception of minimal crowding of the lung markings at the lung bases. No focal pulmonary opacity. The visualized skeletal structures are unremarkable.  IMPRESSION: No active cardiopulmonary disease.   Electronically Signed   By: Conchita Paris M.D.   On: 04/30/2014 11:57     Current Medications:  . amLODipine  5 mg Oral Daily  . aspirin  324 mg Oral NOW   Or  . aspirin  300 mg Rectal NOW  . aspirin  81 mg Oral Daily  . atorvastatin  20 mg Oral q1800  . clopidogrel  75 mg Oral Q breakfast  . enoxaparin (LOVENOX) injection  40 mg Subcutaneous Q24H  . escitalopram  20 mg Oral Daily  . folic acid-pyridoxine-cyancobalamin  1 tablet Oral Daily  . hydrochlorothiazide  12.5 mg Oral Daily  . pantoprazole  80 mg Oral Q1200  . potassium chloride  40 mEq Oral Once  . sodium chloride  3 mL Intravenous Q12H      ASSESSMENT AND PLAN: Carrie Chung is a 73 y.o. female with a  history of HTN, OSA, atrial tachycardia, CAD s/p NSTEMI w/ multivessel stenting (2013) who presented to [facility] ED from cardiac rehab yesterday with chest pain x 2 hours.   Chest pain- in the setting of known CAD.  -- Troponin x3 negative, ECG with no acute ST or TW changes  -- Normal ECHO in 03/2012 with EF 60%. RV mild dilation with mildly reduced systolic function  -- Continue ASA/Plavix, metoprolol, rosuvastatin  -- NPO. Plan for stress echo this AM. Hold BB. Can resume after stress test.  -- Continue nitro paste for sx control. Will also help with BP.   Elevated D-Dimer- mild (0.61). in the setting of recent international travel. Follow up CTA    HTN- continue home amlodipine and HCTZ   OSA- CPAP   Hx of atrial tachycardia- noted once in cardiac rehab. None currently. Continue to monitor   HLD- Lipid panel on 03/07/14 with good control. Continue statin   Hypokalemia- mild at 3.4. Will replete    Lemont Fillers , PA-C 10:28 AM 05/01/2014

## 2014-05-02 ENCOUNTER — Encounter (HOSPITAL_COMMUNITY): Admission: RE | Admit: 2014-05-02 | Payer: Medicare Other | Source: Ambulatory Visit

## 2014-05-04 ENCOUNTER — Encounter (HOSPITAL_COMMUNITY)
Admission: RE | Admit: 2014-05-04 | Discharge: 2014-05-04 | Disposition: A | Discharge status: Home / Self Care | Payer: Medicare Other | Source: Ambulatory Visit | Attending: Cardiovascular Disease | Admitting: Cardiovascular Disease

## 2014-05-07 ENCOUNTER — Encounter (HOSPITAL_COMMUNITY)
Admission: RE | Admit: 2014-05-07 | Discharge: 2014-05-07 | Disposition: A | Discharge status: Home / Self Care | Payer: Medicare Other | Source: Ambulatory Visit | Attending: Cardiovascular Disease | Admitting: Cardiovascular Disease

## 2014-05-09 ENCOUNTER — Encounter (HOSPITAL_COMMUNITY)
Admission: RE | Admit: 2014-05-09 | Discharge: 2014-05-09 | Disposition: A | Discharge status: Home / Self Care | Payer: Medicare Other | Source: Ambulatory Visit | Attending: Cardiovascular Disease | Admitting: Cardiovascular Disease

## 2014-05-11 ENCOUNTER — Encounter (HOSPITAL_COMMUNITY)
Admission: RE | Admit: 2014-05-11 | Discharge: 2014-05-11 | Disposition: A | Discharge status: Home / Self Care | Payer: Medicare Other | Source: Ambulatory Visit | Attending: Cardiovascular Disease | Admitting: Cardiovascular Disease

## 2014-05-16 ENCOUNTER — Encounter (HOSPITAL_COMMUNITY)
Admission: RE | Admit: 2014-05-16 | Discharge: 2014-05-16 | Disposition: A | Discharge status: Home / Self Care | Payer: Medicare Other | Source: Ambulatory Visit | Attending: Cardiovascular Disease | Admitting: Cardiovascular Disease

## 2014-05-18 ENCOUNTER — Encounter (HOSPITAL_COMMUNITY)
Admission: RE | Admit: 2014-05-18 | Discharge: 2014-05-18 | Disposition: A | Discharge status: Home / Self Care | Payer: Medicare Other | Source: Ambulatory Visit | Attending: Cardiovascular Disease | Admitting: Cardiovascular Disease

## 2014-05-20 ENCOUNTER — Other Ambulatory Visit: Payer: Self-pay | Admitting: Cardiovascular Disease

## 2014-05-21 ENCOUNTER — Encounter (HOSPITAL_COMMUNITY)
Admission: RE | Admit: 2014-05-21 | Discharge: 2014-05-21 | Disposition: A | Discharge status: Home / Self Care | Payer: Self-pay | Source: Ambulatory Visit | Attending: Cardiovascular Disease | Admitting: Cardiovascular Disease

## 2014-05-21 ENCOUNTER — Other Ambulatory Visit: Payer: Self-pay

## 2014-05-21 DIAGNOSIS — I10 Essential (primary) hypertension: Secondary | ICD-10-CM | POA: Insufficient documentation

## 2014-05-21 DIAGNOSIS — I214 Non-ST elevation (NSTEMI) myocardial infarction: Secondary | ICD-10-CM | POA: Insufficient documentation

## 2014-05-21 DIAGNOSIS — Z9861 Coronary angioplasty status: Secondary | ICD-10-CM | POA: Insufficient documentation

## 2014-05-21 DIAGNOSIS — R9431 Abnormal electrocardiogram [ECG] [EKG]: Secondary | ICD-10-CM | POA: Insufficient documentation

## 2014-05-21 DIAGNOSIS — I2510 Atherosclerotic heart disease of native coronary artery without angina pectoris: Secondary | ICD-10-CM | POA: Insufficient documentation

## 2014-05-21 DIAGNOSIS — E785 Hyperlipidemia, unspecified: Secondary | ICD-10-CM | POA: Insufficient documentation

## 2014-05-21 DIAGNOSIS — I498 Other specified cardiac arrhythmias: Secondary | ICD-10-CM | POA: Insufficient documentation

## 2014-05-21 DIAGNOSIS — Z5189 Encounter for other specified aftercare: Secondary | ICD-10-CM | POA: Insufficient documentation

## 2014-05-21 MED ORDER — TEMAZEPAM 30 MG PO CAPS: 30.0000 mg | ORAL_CAPSULE | Freq: Every evening | ORAL | Status: DC | PRN

## 2014-05-23 ENCOUNTER — Encounter (HOSPITAL_COMMUNITY)
Admission: RE | Admit: 2014-05-23 | Discharge: 2014-05-23 | Disposition: A | Discharge status: Home / Self Care | Payer: Self-pay | Source: Ambulatory Visit | Attending: Cardiovascular Disease | Admitting: Cardiovascular Disease

## 2014-05-25 ENCOUNTER — Encounter (HOSPITAL_COMMUNITY)
Admission: RE | Admit: 2014-05-25 | Discharge: 2014-05-25 | Disposition: A | Discharge status: Home / Self Care | Payer: Self-pay | Source: Ambulatory Visit | Attending: Cardiovascular Disease | Admitting: Cardiovascular Disease

## 2014-05-28 ENCOUNTER — Encounter (HOSPITAL_COMMUNITY)
Admission: RE | Admit: 2014-05-28 | Discharge: 2014-05-28 | Disposition: A | Discharge status: Home / Self Care | Payer: Self-pay | Source: Ambulatory Visit | Attending: Cardiovascular Disease | Admitting: Cardiovascular Disease

## 2014-05-30 ENCOUNTER — Encounter (HOSPITAL_COMMUNITY): Payer: Medicare Other

## 2014-06-01 ENCOUNTER — Encounter (HOSPITAL_COMMUNITY)
Admission: RE | Admit: 2014-06-01 | Discharge: 2014-06-01 | Disposition: A | Discharge status: Home / Self Care | Payer: Self-pay | Source: Ambulatory Visit | Attending: Cardiovascular Disease | Admitting: Cardiovascular Disease

## 2014-06-04 ENCOUNTER — Encounter (HOSPITAL_COMMUNITY)
Admission: RE | Admit: 2014-06-04 | Discharge: 2014-06-04 | Disposition: A | Discharge status: Home / Self Care | Payer: Self-pay | Source: Ambulatory Visit | Attending: Cardiovascular Disease | Admitting: Cardiovascular Disease

## 2014-06-06 ENCOUNTER — Encounter (HOSPITAL_COMMUNITY)
Admission: RE | Admit: 2014-06-06 | Discharge: 2014-06-06 | Disposition: A | Discharge status: Home / Self Care | Payer: Self-pay | Source: Ambulatory Visit | Attending: Cardiovascular Disease | Admitting: Cardiovascular Disease

## 2014-06-08 ENCOUNTER — Encounter (HOSPITAL_COMMUNITY): Payer: Medicare Other

## 2014-06-08 ENCOUNTER — Encounter: Payer: Self-pay | Admitting: Cardiovascular Disease

## 2014-06-08 ENCOUNTER — Ambulatory Visit (INDEPENDENT_AMBULATORY_CARE_PROVIDER_SITE_OTHER): Payer: Medicare Other | Admitting: Cardiovascular Disease

## 2014-06-08 VITALS — BP 160/80 | HR 69 | Ht 62.0 in | Wt 162.0 lb

## 2014-06-08 DIAGNOSIS — I209 Angina pectoris, unspecified: Secondary | ICD-10-CM | POA: Diagnosis not present

## 2014-06-08 DIAGNOSIS — I25118 Atherosclerotic heart disease of native coronary artery with other forms of angina pectoris: Secondary | ICD-10-CM

## 2014-06-08 DIAGNOSIS — I2510 Atherosclerotic heart disease of native coronary artery without angina pectoris: Secondary | ICD-10-CM | POA: Diagnosis not present

## 2014-06-08 MED ORDER — CLOPIDOGREL BISULFATE 75 MG PO TABS: 75.0000 mg | ORAL_TABLET | Freq: Every day | ORAL | Status: DC

## 2014-06-08 NOTE — Patient Instructions (Signed)
Your physician wants you to follow-up in: 6 MONTHS with Dr Cooper.  You will receive a reminder letter in the mail two months in advance. If you don't receive a letter, please call our office to schedule the follow-up appointment.  Your physician recommends that you continue on your current medications as directed. Please refer to the Current Medication list given to you today.  

## 2014-06-08 NOTE — Progress Notes (Signed)
HPI:  73 year old woman presenting for followup evaluation. The patient underwent multivessel stenting in April 2013 after presenting with a non-ST elevation MI. She was treated with stenting of the right coronary artery initially followed by staged PCI of the LAD and left circumflex. She was treated with drug-eluting stents. Her left ventricular function was normal with an EF of 60%. She had a stress Myoview scan in December 2013 and this showed normal perfusion and normal LV function.  She was hospitalized last month with chest pain. She ruled out for myocardial infarction underwent a stress echocardiogram the following morning demonstrating no ischemic changes. She presents today for followup evaluation.  Overall she is doing well. She has occasional fleeting chest pains with exercise. She denies dyspnea, edema, orthopnea, or PND. No palpitations recently.   Outpatient Encounter Prescriptions as of 06/08/2014  Medication Sig  . acetaminophen (TYLENOL) 500 MG tablet Take 1,000 mg by mouth every 6 (six) hours as needed for moderate pain. For pain   . ALPRAZolam (XANAX) 0.5 MG tablet Take 1 tablet (0.5 mg total) by mouth 2 (two) times daily as needed for sleep.  Marland Kitchen amLODipine (NORVASC) 5 MG tablet Take 5 mg by mouth daily.  Marland Kitchen aspirin 81 MG tablet Take 1 tablet (81 mg total) by mouth daily.  . Calcium Carbonate-Vitamin D (CALTRATE 600+D) 600-400 MG-UNIT per tablet Take 1 tablet by mouth 2 (two) times daily.   . clopidogrel (PLAVIX) 75 MG tablet TAKE 1 TABLET (75 MG TOTAL) BY MOUTH DAILY.  Marland Kitchen escitalopram (LEXAPRO) 20 MG tablet Take 1 tablet (20 mg total) by mouth daily.  Marland Kitchen esomeprazole (NEXIUM) 40 MG capsule Take 40 mg by mouth daily at 12 noon.  . folic acid-pyridoxine-cyancobalamin (FOLBIC) 2.5-25-2 MG TABS Take 1 tablet by mouth daily.  . hydrochlorothiazide (HYDRODIURIL) 25 MG tablet Take 12.5 mg by mouth daily.  Marland Kitchen HYDROcodone-acetaminophen (NORCO) 10-325 MG per tablet Take 1 tablet by  mouth every 8 (eight) hours as needed for severe pain.  . metoprolol (LOPRESSOR) 50 MG tablet TAKE 1 TABLET (50 MG TOTAL) BY MOUTH 2 (TWO) TIMES DAILY.  . nitroGLYCERIN (NITROSTAT) 0.4 MG SL tablet Place 1 tablet (0.4 mg total) under the tongue every 5 (five) minutes x 3 doses as needed for chest pain.  . rosuvastatin (CRESTOR) 10 MG tablet Take 1 tablet (10 mg total) by mouth daily.  . temazepam (RESTORIL) 30 MG capsule Take 1 capsule (30 mg total) by mouth at bedtime as needed for sleep.    Allergies  Allergen Reactions  . Penicillins Hives and Swelling  . Sulfonamide Derivatives Hives and Swelling  . Zinc Oxide Rash    Past Medical History  Diagnosis Date  . Hypokalemia   . HTN (hypertension)   . Sleep apnea   . Endometrial polyp   . CAD (coronary artery disease)     a. NSTEMI 03/2012 ([place] 03/27/12: pLAD 30%, mLAD 99%, mCFX 95%, mRCA 99% with thrombus, EF 65%);  s/p PTCA/DESx1 to prox-mid RCA 03/27/12 urgently in setting of hypotension/bradycardia, with staged PTCA/Evolve study stent to mid LAD & PTCA/Evolve study stent to prox LCx 03/29/12 ;   echo 03/28/12:EF 60%, Aortic sclerosis without AS, mild RVE, mild reduced RVSF    . Hyperglycemia   . Hyperlipidemia   . Atrial tachycardia     a. noted at cardiac rehab 5/13; event monitor ordered to assess for AFib  . MI, old     ROS: Negative except as per HPI  BP 160/80  Pulse  69  Ht 5\' 2"  (1.575 m)  Wt 73.483 kg (162 lb)  BMI 29.62 kg/m2  PHYSICAL EXAM: Pt is alert and oriented, NAD HEENT: normal Neck: JVP - normal, carotids 2+= without bruits Lungs: CTA bilaterally CV: RRR with soft systolic ejection murmur at the right upper sternal border Abd: soft, NT, Positive BS, no hepatomegaly Ext: no C/C/E, distal pulses intact and equal Skin: warm/dry no rash  Exercise stress echocardiogram: Study Conclusions  - Stress ECG conclusions: The stress ECG was normal. - Staged echo: Normal echo stress Impressions:  - Normal study after  maximal exercise.  ASSESSMENT AND PLAN: 1. 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 The patient appears stable. I did not make any medication changes today. Her blood pressure has been well-controlled based on readings at cardiac rehabilitation. We discussed long-term dual antiplatelet therapy, as well as the results of the DAPT Trial. Will continue for another year and consider discontinuation when she is 3 years out from multivessel PCI. She understands to call if she has any progressive symptoms of angina.  2. Hypertension. Blood pressure controlled on amlodipine, hydrochlorothiazide, and metoprolol (based on home readings and readings at cardiac rehabilitation).  3. Hyperlipidemia. The patient is on rosuvastatin 10 mg. Lipids are excellent.  Lipid Panel     Component Value Date/Time   CHOL 172 05/01/2014 0620   TRIG 116 05/01/2014 0620   HDL 92 05/01/2014 0620   CHOLHDL 1.9 05/01/2014 0620   VLDL 23 05/01/2014 0620   LDLCALC 57 05/01/2014 0620    I'll see her back in 6 months for follow-up.  [name] 06/08/2014 11:22 AM

## 2014-06-11 ENCOUNTER — Encounter (HOSPITAL_COMMUNITY)
Admission: RE | Admit: 2014-06-11 | Discharge: 2014-06-11 | Disposition: A | Discharge status: Home / Self Care | Payer: Self-pay | Source: Ambulatory Visit | Attending: Cardiovascular Disease | Admitting: Cardiovascular Disease

## 2014-06-11 ENCOUNTER — Other Ambulatory Visit: Payer: Self-pay | Admitting: Cardiovascular Disease

## 2014-06-13 ENCOUNTER — Encounter (HOSPITAL_COMMUNITY)
Admission: RE | Admit: 2014-06-13 | Discharge: 2014-06-13 | Disposition: A | Discharge status: Home / Self Care | Payer: Self-pay | Source: Ambulatory Visit | Attending: Cardiovascular Disease | Admitting: Cardiovascular Disease

## 2014-06-15 ENCOUNTER — Encounter (HOSPITAL_COMMUNITY)
Admission: RE | Admit: 2014-06-15 | Discharge: 2014-06-15 | Disposition: A | Discharge status: Home / Self Care | Payer: Self-pay | Source: Ambulatory Visit | Attending: Cardiovascular Disease | Admitting: Cardiovascular Disease

## 2014-06-18 ENCOUNTER — Encounter (HOSPITAL_COMMUNITY)
Admission: RE | Admit: 2014-06-18 | Discharge: 2014-06-18 | Disposition: A | Discharge status: Home / Self Care | Payer: Self-pay | Source: Ambulatory Visit | Attending: Cardiovascular Disease | Admitting: Cardiovascular Disease

## 2014-06-20 ENCOUNTER — Encounter (HOSPITAL_COMMUNITY)
Admission: RE | Admit: 2014-06-20 | Discharge: 2014-06-20 | Disposition: A | Discharge status: Home / Self Care | Payer: Self-pay | Source: Ambulatory Visit | Attending: Cardiovascular Disease | Admitting: Cardiovascular Disease

## 2014-06-20 DIAGNOSIS — I10 Essential (primary) hypertension: Secondary | ICD-10-CM | POA: Insufficient documentation

## 2014-06-20 DIAGNOSIS — I2510 Atherosclerotic heart disease of native coronary artery without angina pectoris: Secondary | ICD-10-CM | POA: Insufficient documentation

## 2014-06-20 DIAGNOSIS — E785 Hyperlipidemia, unspecified: Secondary | ICD-10-CM | POA: Insufficient documentation

## 2014-06-20 DIAGNOSIS — I498 Other specified cardiac arrhythmias: Secondary | ICD-10-CM | POA: Insufficient documentation

## 2014-06-20 DIAGNOSIS — I214 Non-ST elevation (NSTEMI) myocardial infarction: Secondary | ICD-10-CM | POA: Insufficient documentation

## 2014-06-20 DIAGNOSIS — Z9861 Coronary angioplasty status: Secondary | ICD-10-CM | POA: Insufficient documentation

## 2014-06-20 DIAGNOSIS — R9431 Abnormal electrocardiogram [ECG] [EKG]: Secondary | ICD-10-CM | POA: Insufficient documentation

## 2014-06-20 DIAGNOSIS — Z5189 Encounter for other specified aftercare: Secondary | ICD-10-CM | POA: Insufficient documentation

## 2014-06-22 ENCOUNTER — Encounter (HOSPITAL_COMMUNITY): Payer: Medicare Other

## 2014-06-25 ENCOUNTER — Encounter (HOSPITAL_COMMUNITY)
Admission: RE | Admit: 2014-06-25 | Discharge: 2014-06-25 | Disposition: A | Discharge status: Home / Self Care | Payer: Self-pay | Source: Ambulatory Visit | Attending: Cardiovascular Disease | Admitting: Cardiovascular Disease

## 2014-06-27 ENCOUNTER — Encounter (HOSPITAL_COMMUNITY)
Admission: RE | Admit: 2014-06-27 | Discharge: 2014-06-27 | Disposition: A | Discharge status: Home / Self Care | Payer: Self-pay | Source: Ambulatory Visit | Attending: Cardiovascular Disease | Admitting: Cardiovascular Disease

## 2014-06-29 ENCOUNTER — Encounter (HOSPITAL_COMMUNITY)
Admission: RE | Admit: 2014-06-29 | Discharge: 2014-06-29 | Disposition: A | Discharge status: Home / Self Care | Payer: Self-pay | Source: Ambulatory Visit | Attending: Cardiovascular Disease | Admitting: Cardiovascular Disease

## 2014-07-02 ENCOUNTER — Inpatient Hospital Stay (HOSPITAL_COMMUNITY)
Admission: EM | Admit: 2014-07-02 | Discharge: 2014-07-04 | DRG: 603 | Disposition: A | Discharge status: Home / Self Care | Payer: Medicare Other | Attending: Internal Medicine | Admitting: Internal Medicine

## 2014-07-02 ENCOUNTER — Encounter (HOSPITAL_COMMUNITY): Payer: Self-pay | Admitting: Emergency Medicine

## 2014-07-02 ENCOUNTER — Encounter (HOSPITAL_COMMUNITY): Payer: Medicare Other

## 2014-07-02 ENCOUNTER — Emergency Department (INDEPENDENT_AMBULATORY_CARE_PROVIDER_SITE_OTHER)
Admission: EM | Admit: 2014-07-02 | Discharge: 2014-07-02 | Disposition: A | Discharge status: Nursing Home (Includes ICF) | Payer: Medicare Other | Source: Home / Self Care

## 2014-07-02 DIAGNOSIS — L988 Other specified disorders of the skin and subcutaneous tissue: Secondary | ICD-10-CM | POA: Diagnosis not present

## 2014-07-02 DIAGNOSIS — Z7982 Long term (current) use of aspirin: Secondary | ICD-10-CM

## 2014-07-02 DIAGNOSIS — L03119 Cellulitis of unspecified part of limb: Secondary | ICD-10-CM | POA: Diagnosis not present

## 2014-07-02 DIAGNOSIS — E876 Hypokalemia: Secondary | ICD-10-CM | POA: Diagnosis present

## 2014-07-02 DIAGNOSIS — R0989 Other specified symptoms and signs involving the circulatory and respiratory systems: Secondary | ICD-10-CM

## 2014-07-02 DIAGNOSIS — I2510 Atherosclerotic heart disease of native coronary artery without angina pectoris: Secondary | ICD-10-CM | POA: Diagnosis present

## 2014-07-02 DIAGNOSIS — Z880 Allergy status to penicillin: Secondary | ICD-10-CM | POA: Diagnosis not present

## 2014-07-02 DIAGNOSIS — R7309 Other abnormal glucose: Secondary | ICD-10-CM | POA: Diagnosis present

## 2014-07-02 DIAGNOSIS — L03011 Cellulitis of right finger: Principal | ICD-10-CM

## 2014-07-02 DIAGNOSIS — Z79899 Other long term (current) drug therapy: Secondary | ICD-10-CM | POA: Diagnosis not present

## 2014-07-02 DIAGNOSIS — L03113 Cellulitis of right upper limb: Secondary | ICD-10-CM

## 2014-07-02 DIAGNOSIS — I498 Other specified cardiac arrhythmias: Secondary | ICD-10-CM | POA: Diagnosis present

## 2014-07-02 DIAGNOSIS — E785 Hyperlipidemia, unspecified: Secondary | ICD-10-CM

## 2014-07-02 DIAGNOSIS — G4700 Insomnia, unspecified: Secondary | ICD-10-CM

## 2014-07-02 DIAGNOSIS — I25118 Atherosclerotic heart disease of native coronary artery with other forms of angina pectoris: Secondary | ICD-10-CM

## 2014-07-02 DIAGNOSIS — G4730 Sleep apnea, unspecified: Secondary | ICD-10-CM | POA: Diagnosis present

## 2014-07-02 DIAGNOSIS — R238 Other skin changes: Secondary | ICD-10-CM | POA: Diagnosis present

## 2014-07-02 DIAGNOSIS — I209 Angina pectoris, unspecified: Secondary | ICD-10-CM | POA: Diagnosis not present

## 2014-07-02 DIAGNOSIS — I10 Essential (primary) hypertension: Secondary | ICD-10-CM | POA: Diagnosis not present

## 2014-07-02 DIAGNOSIS — Z9861 Coronary angioplasty status: Secondary | ICD-10-CM | POA: Diagnosis not present

## 2014-07-02 DIAGNOSIS — L02519 Cutaneous abscess of unspecified hand: Principal | ICD-10-CM | POA: Diagnosis present

## 2014-07-02 DIAGNOSIS — Z87891 Personal history of nicotine dependence: Secondary | ICD-10-CM | POA: Diagnosis not present

## 2014-07-02 DIAGNOSIS — F419 Anxiety disorder, unspecified: Secondary | ICD-10-CM

## 2014-07-02 DIAGNOSIS — I214 Non-ST elevation (NSTEMI) myocardial infarction: Secondary | ICD-10-CM

## 2014-07-02 DIAGNOSIS — I252 Old myocardial infarction: Secondary | ICD-10-CM | POA: Diagnosis not present

## 2014-07-02 DIAGNOSIS — R072 Precordial pain: Secondary | ICD-10-CM

## 2014-07-02 DIAGNOSIS — R0609 Other forms of dyspnea: Secondary | ICD-10-CM

## 2014-07-02 DIAGNOSIS — Z8614 Personal history of Methicillin resistant Staphylococcus aureus infection: Secondary | ICD-10-CM | POA: Diagnosis not present

## 2014-07-02 DIAGNOSIS — Z888 Allergy status to other drugs, medicaments and biological substances status: Secondary | ICD-10-CM

## 2014-07-02 DIAGNOSIS — Z7902 Long term (current) use of antithrombotics/antiplatelets: Secondary | ICD-10-CM | POA: Diagnosis not present

## 2014-07-02 DIAGNOSIS — L03019 Cellulitis of unspecified finger: Secondary | ICD-10-CM

## 2014-07-02 DIAGNOSIS — IMO0001 Reserved for inherently not codable concepts without codable children: Secondary | ICD-10-CM

## 2014-07-02 DIAGNOSIS — R9431 Abnormal electrocardiogram [ECG] [EKG]: Secondary | ICD-10-CM

## 2014-07-02 LAB — CBC WITH DIFFERENTIAL/PLATELET
BASOS ABS: 0 10*3/uL (ref 0.0–0.1)
BASOS PCT: 0 % (ref 0–1)
Eosinophils Absolute: 0 10*3/uL (ref 0.0–0.7)
Eosinophils Relative: 0 % (ref 0–5)
HCT: 41.5 % (ref 36.0–46.0)
Hemoglobin: 14.2 g/dL (ref 12.0–15.0)
Lymphocytes Relative: 11 % — ABNORMAL LOW (ref 12–46)
Lymphs Abs: 1 10*3/uL (ref 0.7–4.0)
MCH: 32.3 pg (ref 26.0–34.0)
MCHC: 34.2 g/dL (ref 30.0–36.0)
MCV: 94.5 fL (ref 78.0–100.0)
MONO ABS: 0.6 10*3/uL (ref 0.1–1.0)
Monocytes Relative: 6 % (ref 3–12)
NEUTROS ABS: 7.6 10*3/uL (ref 1.7–7.7)
NEUTROS PCT: 83 % — AB (ref 43–77)
PLATELETS: 137 10*3/uL — AB (ref 150–400)
RBC: 4.39 MIL/uL (ref 3.87–5.11)
RDW: 12.7 % (ref 11.5–15.5)
WBC: 9.2 10*3/uL (ref 4.0–10.5)

## 2014-07-02 LAB — BASIC METABOLIC PANEL
ANION GAP: 14 (ref 5–15)
BUN: 11 mg/dL (ref 6–23)
CHLORIDE: 96 meq/L (ref 96–112)
CO2: 31 mEq/L (ref 19–32)
Calcium: 9.7 mg/dL (ref 8.4–10.5)
Creatinine, Ser: 0.69 mg/dL (ref 0.50–1.10)
GFR calc non Af Amer: 84 mL/min — ABNORMAL LOW (ref >90)
Glucose, Bld: 100 mg/dL — ABNORMAL HIGH (ref 70–99)
POTASSIUM: 3.5 meq/L — AB (ref 3.7–5.3)
SODIUM: 141 meq/L (ref 137–147)

## 2014-07-02 MED ORDER — ESCITALOPRAM OXALATE 20 MG PO TABS
20.0000 mg | ORAL_TABLET | Freq: Every day | ORAL | Status: DC
  Administered 2014-07-03 – 2014-07-04 (×2): 20 mg via ORAL
  Filled 2014-07-02 (×2): qty 1

## 2014-07-02 MED ORDER — SODIUM CHLORIDE 0.9 % IV SOLN: 250.0000 mL | INTRAVENOUS | Status: DC | PRN

## 2014-07-02 MED ORDER — ASPIRIN 81 MG PO TABS: 81.0000 mg | ORAL_TABLET | Freq: Every day | ORAL | Status: DC

## 2014-07-02 MED ORDER — POLYETHYLENE GLYCOL 3350 17 G PO PACK
17.0000 g | PACK | Freq: Every day | ORAL | Status: DC | PRN
  Filled 2014-07-02: qty 1

## 2014-07-02 MED ORDER — SODIUM CHLORIDE 0.9 % IJ SOLN: 3.0000 mL | INTRAMUSCULAR | Status: DC | PRN

## 2014-07-02 MED ORDER — PANTOPRAZOLE SODIUM 40 MG PO TBEC
40.0000 mg | DELAYED_RELEASE_TABLET | Freq: Every day | ORAL | Status: DC
  Administered 2014-07-03 – 2014-07-04 (×2): 40 mg via ORAL
  Filled 2014-07-02 (×2): qty 1

## 2014-07-02 MED ORDER — ACETAMINOPHEN 325 MG PO TABS
650.0000 mg | ORAL_TABLET | Freq: Four times a day (QID) | ORAL | Status: DC | PRN
  Administered 2014-07-02 – 2014-07-04 (×5): 650 mg via ORAL
  Filled 2014-07-02 (×5): qty 2

## 2014-07-02 MED ORDER — VANCOMYCIN HCL 10 G IV SOLR
1250.0000 mg | INTRAVENOUS | Status: DC
  Administered 2014-07-02 – 2014-07-03 (×2): 1250 mg via INTRAVENOUS
  Filled 2014-07-02 (×5): qty 1250

## 2014-07-02 MED ORDER — ASPIRIN EC 81 MG PO TBEC
81.0000 mg | DELAYED_RELEASE_TABLET | Freq: Every day | ORAL | Status: DC
  Administered 2014-07-03 – 2014-07-04 (×2): 81 mg via ORAL
  Filled 2014-07-02 (×2): qty 1

## 2014-07-02 MED ORDER — HYDROCODONE-ACETAMINOPHEN 5-325 MG PO TABS
1.0000 | ORAL_TABLET | Freq: Once | ORAL | Status: AC
  Administered 2014-07-02: 1 via ORAL
  Filled 2014-07-02: qty 1

## 2014-07-02 MED ORDER — CLINDAMYCIN PHOSPHATE 600 MG/50ML IV SOLN
600.0000 mg | Freq: Once | INTRAVENOUS | Status: AC
  Administered 2014-07-02: 600 mg via INTRAVENOUS
  Filled 2014-07-02: qty 50

## 2014-07-02 MED ORDER — ONDANSETRON HCL 4 MG/2ML IJ SOLN: 4.0000 mg | Freq: Four times a day (QID) | INTRAMUSCULAR | Status: DC | PRN

## 2014-07-02 MED ORDER — TEMAZEPAM 15 MG PO CAPS
30.0000 mg | ORAL_CAPSULE | Freq: Every day | ORAL | Status: DC
  Administered 2014-07-02 – 2014-07-03 (×2): 30 mg via ORAL
  Filled 2014-07-02 (×2): qty 2

## 2014-07-02 MED ORDER — ALPRAZOLAM 0.5 MG PO TABS: 0.5000 mg | ORAL_TABLET | Freq: Two times a day (BID) | ORAL | Status: DC | PRN

## 2014-07-02 MED ORDER — NITROGLYCERIN 0.4 MG SL SUBL: 0.4000 mg | SUBLINGUAL_TABLET | SUBLINGUAL | Status: DC | PRN

## 2014-07-02 MED ORDER — AMLODIPINE BESYLATE 5 MG PO TABS
5.0000 mg | ORAL_TABLET | Freq: Every day | ORAL | Status: DC
  Administered 2014-07-03 – 2014-07-04 (×2): 5 mg via ORAL
  Filled 2014-07-02 (×2): qty 1

## 2014-07-02 MED ORDER — ONDANSETRON HCL 4 MG PO TABS
4.0000 mg | ORAL_TABLET | Freq: Four times a day (QID) | ORAL | Status: DC | PRN
  Filled 2014-07-02: qty 1

## 2014-07-02 MED ORDER — HEPARIN SODIUM (PORCINE) 5000 UNIT/ML IJ SOLN
5000.0000 [IU] | Freq: Three times a day (TID) | INTRAMUSCULAR | Status: DC
  Filled 2014-07-02 (×8): qty 1

## 2014-07-02 MED ORDER — ATORVASTATIN CALCIUM 40 MG PO TABS
40.0000 mg | ORAL_TABLET | Freq: Every day | ORAL | Status: DC
  Administered 2014-07-02 – 2014-07-03 (×2): 40 mg via ORAL
  Filled 2014-07-02 (×3): qty 1

## 2014-07-02 MED ORDER — CLOPIDOGREL BISULFATE 75 MG PO TABS
75.0000 mg | ORAL_TABLET | Freq: Every day | ORAL | Status: DC
Start: 2014-07-03 — End: 2014-07-04
  Administered 2014-07-03 – 2014-07-04 (×2): 75 mg via ORAL
  Filled 2014-07-02 (×2): qty 1

## 2014-07-02 MED ORDER — METOPROLOL TARTRATE 50 MG PO TABS
50.0000 mg | ORAL_TABLET | Freq: Two times a day (BID) | ORAL | Status: DC
  Administered 2014-07-02 – 2014-07-04 (×4): 50 mg via ORAL
  Filled 2014-07-02 (×5): qty 1

## 2014-07-02 MED ORDER — SODIUM CHLORIDE 0.9 % IJ SOLN
3.0000 mL | Freq: Two times a day (BID) | INTRAMUSCULAR | Status: DC
  Administered 2014-07-02 – 2014-07-03 (×3): 3 mL via INTRAVENOUS

## 2014-07-02 MED ORDER — ACETAMINOPHEN 650 MG RE SUPP: 650.0000 mg | Freq: Four times a day (QID) | RECTAL | Status: DC | PRN

## 2014-07-02 NOTE — ED Notes (Signed)
MD Harrison at the bedside.  

## 2014-07-02 NOTE — ED Provider Notes (Addendum)
CSN: [id number]     Arrival date & time 07/02/14  1028 History   First MD Initiated Contact with Patient 07/02/14 1244     Chief Complaint  Patient presents with  . Abscess     (Consider location/radiation/quality/duration/timing/severity/associated sxs/prior Treatment) Patient is a 73 y.o. female presenting with abscess. The history is provided by the patient.  Abscess Location:  Finger Finger abscess location:  R index finger Size:  3cm Abscess quality: fluctuance, redness and warmth   Red streaking: yes   Duration:  2 days Progression:  Worsening Chronicity:  New Relieved by:  Nothing Worsened by:  Nothing tried Ineffective treatments:  None tried Associated symptoms: no fatigue, no fever, no headaches, no nausea and no vomiting     Past Medical History  Diagnosis Date  . Hypokalemia   . HTN (hypertension)   . Sleep apnea   . Endometrial polyp   . CAD (coronary artery disease)     a. NSTEMI 03/2012 ([place] 03/27/12: pLAD 30%, mLAD 99%, mCFX 95%, mRCA 99% with thrombus, EF 65%);  s/p PTCA/DESx1 to prox-mid RCA 03/27/12 urgently in setting of hypotension/bradycardia, with staged PTCA/Evolve study stent to mid LAD & PTCA/Evolve study stent to prox LCx 03/29/12 ;   echo 03/28/12:EF 60%, Aortic sclerosis without AS, mild RVE, mild reduced RVSF    . Hyperglycemia   . Hyperlipidemia   . Atrial tachycardia     a. noted at cardiac rehab 5/13; event monitor ordered to assess for AFib  . MI, old    Past Surgical History  Procedure Laterality Date  . Cholecystectomy  1978  . Appendectomy  73 years old  . Tonsillectomy  73 years old  . Cesarean section  1984  . Carotid stent  04/2012    x3   Family History  Problem Relation Age of Onset  . Coronary artery disease Mother     CABG at age 32  . Cancer Father     larynx   History  Substance Use Topics  . Smoking status: Former Smoker    Types: Cigarettes    Quit date: 04/19/1980  . Smokeless tobacco: Never Used  . Alcohol Use: Yes       Comment: socially   OB History   Grav Para Term Preterm Abortions TAB SAB Ect Mult Living                 Review of Systems  Constitutional: Negative for fever and fatigue.  HENT: Negative for congestion and drooling.   Eyes: Negative for pain.  Respiratory: Negative for cough and shortness of breath.   Cardiovascular: Negative for chest pain.  Gastrointestinal: Negative for nausea, vomiting, abdominal pain and diarrhea.  Genitourinary: Negative for dysuria and hematuria.  Musculoskeletal: Negative for back pain, gait problem and neck pain.  Skin: Negative for color change.  Neurological: Negative for dizziness and headaches.  Hematological: Negative for adenopathy.  Psychiatric/Behavioral: Negative for behavioral problems.  All other systems reviewed and are negative.     Allergies  Penicillins; Sulfonamide derivatives; and Zinc oxide  Home Medications   Prior to Admission medications   Medication Sig Start Date End Date Taking? Authorizing Provider  acetaminophen (TYLENOL) 500 MG tablet Take 1,000 mg by mouth every 6 (six) hours as needed for moderate pain. For pain     Historical Provider, MD  ALPRAZolam Duanne Moron) 0.5 MG tablet Take 1 tablet (0.5 mg total) by mouth 2 (two) times daily as needed for sleep. 03/24/13   Cresenciano Lick  Baxley, MD  amLODipine (NORVASC) 5 MG tablet Take 5 mg by mouth daily.    Historical Provider, MD  aspirin 81 MG tablet Take 1 tablet (81 mg total) by mouth daily. 03/30/12   Dayna N Dunn, PA-C  Calcium Carbonate-Vitamin D (CALTRATE 600+D) 600-400 MG-UNIT per tablet Take 1 tablet by mouth 2 (two) times daily.     Historical Provider, MD  clopidogrel (PLAVIX) 75 MG tablet Take 1 tablet (75 mg total) by mouth daily. 06/08/14   [name], MD  escitalopram (LEXAPRO) 20 MG tablet Take 1 tablet (20 mg total) by mouth daily. 03/28/14   [name], MD  esomeprazole (NEXIUM) 40 MG capsule Take 40 mg by mouth daily at 12 noon.    Historical Provider, MD   folic acid-pyridoxine-cyancobalamin (FOLBIC) 2.5-25-2 MG TABS Take 1 tablet by mouth daily.    Historical Provider, MD  hydrochlorothiazide (HYDRODIURIL) 25 MG tablet Take 12.5 mg by mouth daily.    Historical Provider, MD  HYDROcodone-acetaminophen (NORCO) 10-325 MG per tablet Take 1 tablet by mouth every 8 (eight) hours as needed for severe pain.    Historical Provider, MD  metoprolol (LOPRESSOR) 50 MG tablet TAKE 1 TABLET (50 MG TOTAL) BY MOUTH 2 (TWO) TIMES DAILY. 06/11/14   [name], MD  nitroGLYCERIN (NITROSTAT) 0.4 MG SL tablet Place 1 tablet (0.4 mg total) under the tongue every 5 (five) minutes x 3 doses as needed for chest pain. 10/18/13 10/19/14  [name], MD  rosuvastatin (CRESTOR) 10 MG tablet Take 1 tablet (10 mg total) by mouth daily. 10/18/13   [name], MD  temazepam (RESTORIL) 30 MG capsule Take 1 capsule (30 mg total) by mouth at bedtime as needed for sleep. 05/21/14   [name], MD   BP 143/63  Pulse 73  Temp(Src) 99 F (37.2 C) (Oral)  Resp 20  SpO2 96% Physical Exam  Nursing note and vitals reviewed. Constitutional: She is oriented to person, place, and time. She appears well-developed and well-nourished.  HENT:  Head: Normocephalic and atraumatic.  Mouth/Throat: Oropharynx is clear and moist. No oropharyngeal exudate.  Eyes: Conjunctivae and EOM are normal. Pupils are equal, round, and reactive to light.  Neck: Normal range of motion. Neck supple.  Cardiovascular: Normal rate, regular rhythm, normal heart sounds and intact distal pulses.  Exam reveals no gallop and no friction rub.   No murmur heard. Pulmonary/Chest: Effort normal and breath sounds normal. No respiratory distress. She has no wheezes.  Abdominal: Soft. Bowel sounds are normal. There is no tenderness. There is no rebound and no guarding.  Musculoskeletal: Normal range of motion. She exhibits no edema and no tenderness.  Pain mild size purple bulla is noted at the base of the right  index finger on the dorsal surface. There is mild circumferential swelling of the base of this digit. There is also surrounding mild erythema extending to the wrist and slightly up the forearm.   The patient appears to have normal sensation and range of motion in her right hand.  Neurological: She is alert and oriented to person, place, and time.  Skin: Skin is warm and dry.  Psychiatric: She has a normal mood and affect. Her behavior is normal.    ED Course  Debridement Date/Time: 07/02/2014 5:24 PM Performed by: Pamella Pert, S Authorized by: Pamella Pert, S Consent: Verbal consent obtained. written consent not obtained. Risks and benefits: risks, benefits and alternatives were discussed Consent given by: patient Patient understanding: patient states understanding of  the procedure being performed Required items: required blood products, implants, devices, and special equipment available Patient identity confirmed: verbally with patient, arm band, provided demographic data and hospital-assigned identification number Time out: Immediately prior to procedure a "time out" was called to verify the correct patient, procedure, equipment, support staff and site/side marked as required. Preparation: Patient was prepped and draped in the usual sterile fashion. Local anesthesia used: no Patient sedated: no Patient tolerance: Patient tolerated the procedure well with no immediate complications. Comments: I debrided the bullae on the right dorsal index finger. Serous fluid inside. No purulent drainage seen.   Wound debridement performed with sterile forceps and scissors.    (including critical care time) Labs Review Labs Reviewed  CBC WITH DIFFERENTIAL - Abnormal; Notable for the following:    Platelets 137 (*)    Neutrophils Relative % 83 (*)    Lymphocytes Relative 11 (*)    All other components within normal limits  BASIC METABOLIC PANEL - Abnormal; Notable for the following:     Potassium 3.5 (*)    Glucose, Bld 100 (*)    GFR calc non Af Amer 84 (*)    All other components within normal limits    Imaging Review No results found.   EKG Interpretation None          MDM   Final diagnoses:  Cellulitis of hand  Bullae    12:54 PM 73 y.o. female who presents with a lesion to the dorsal surface of her right index finger. She states that she first noticed a small red lesion 2 days ago. Yesterday he appeared to have some mild purulence beneath it, but was not draining. Today it has transformed into a purplish discolored bullae. She was seen in urgent care and referred here. She is found to have a temperature of 100.5 at the urgent care.  Discussed case w/ Dr. Grandville Silos who will see the pt. Will debride wound. Serous fluid inside the bullae, no purulent drainage seen. Will admit to hospitalist.     Blanchard Kelch, MD 07/02/14 [place], MD 07/10/14 2232

## 2014-07-02 NOTE — ED Notes (Signed)
Phlebotomy at the bedside  

## 2014-07-02 NOTE — ED Notes (Signed)
C/o possible abscess to right index finger and right axillary with swelling, redness, hot to touch and pain since 7/11.   Pt has used antibiotic ointment on right index finger.  No other treatments tried.

## 2014-07-02 NOTE — H&P (Signed)
[facility] History and Physical  [name] IHK:[id number] DOB: June 22, 1941 DOA: 07/02/2014  Referring physician: Delia Chimes PCP: [name], MD   Chief Complaint: Right hand swelling  HPI: Carrie Chung is a 73 y.o. female  With past medical history of NSTEMI, hypertension that comes in right hand swelling this started 2 days prior to admission and now with an abscess fluctuating, with erythema is extending proximally warm to touch about 3 cm. Today and his sternum and to the purplish discolored bullae on the second finger, she went to urgent care was referred to the ED.  In the ED: She has a temperature of 105, no leukocytosis other labs within normal limits.  Review of Systems:  Constitutional:  No weight loss, night sweats, Fevers, chills, fatigue.  HEENT:  No headaches, Difficulty swallowing,Tooth/dental problems,Sore throat,  No sneezing, itching, ear ache, nasal congestion, post nasal drip,  Cardio-vascular:  No chest pain, Orthopnea, PND, swelling in lower extremities, anasarca, dizziness, palpitations  GI:  No heartburn, indigestion, abdominal pain, nausea, vomiting, diarrhea, change in bowel habits, loss of appetite  Resp:  No shortness of breath with exertion or at rest. No excess mucus, no productive cough, No non-productive cough, No coughing up of blood.No change in color of mucus.No wheezing.No chest wall deformity  Skin:  no rash or lesions.  GU:  no dysuria, change in color of urine, no urgency or frequency. No flank pain.  Psych:  No change in mood or affect. No depression or anxiety. No memory loss.   Past Medical History  Diagnosis Date  . Hypokalemia   . HTN (hypertension)   . Sleep apnea   . Endometrial polyp   . CAD (coronary artery disease)     a. NSTEMI 03/2012 ([place] 03/27/12: pLAD 30%, mLAD 99%, mCFX 95%, mRCA 99% with thrombus, EF 65%);  s/p PTCA/DESx1 to prox-mid RCA 03/27/12 urgently in setting of hypotension/bradycardia, with  staged PTCA/Evolve study stent to mid LAD & PTCA/Evolve study stent to prox LCx 03/29/12 ;   echo 03/28/12:EF 60%, Aortic sclerosis without AS, mild RVE, mild reduced RVSF    . Hyperglycemia   . Hyperlipidemia   . Atrial tachycardia     a. noted at cardiac rehab 5/13; event monitor ordered to assess for AFib  . MI, old    Past Surgical History  Procedure Laterality Date  . Cholecystectomy  1978  . Appendectomy  73 years old  . Tonsillectomy  73 years old  . Cesarean section  1984  . Carotid stent  04/2012    x3   Social History:  reports that she quit smoking about 34 years ago. Her smoking use included Cigarettes. She smoked 0.00 packs per day. She has never used smokeless tobacco. She reports that she drinks alcohol. She reports that she does not use illicit drugs.  Allergies  Allergen Reactions  . Penicillins Hives and Swelling  . Sulfonamide Derivatives Hives and Swelling  . Zinc Oxide Rash    Family History  Problem Relation Age of Onset  . Coronary artery disease Mother     CABG at age 63  . Cancer Father     larynx     Prior to Admission medications   Medication Sig Start Date End Date Taking? Authorizing Provider  ALPRAZolam Duanne Moron) 0.5 MG tablet Take 0.5 mg by mouth 2 (two) times daily as needed for anxiety or sleep. 03/24/13  Yes [name], MD  amLODipine (NORVASC) 5 MG tablet Take 5 mg by mouth  daily.   Yes Historical Provider, MD  aspirin 81 MG tablet Take 1 tablet (81 mg total) by mouth daily. 03/30/12  Yes Dayna N Dunn, PA-C  Calcium Carbonate-Vitamin D (CALTRATE 600+D) 600-400 MG-UNIT per tablet Take 1 tablet by mouth 2 (two) times daily.    Yes Historical Provider, MD  clopidogrel (PLAVIX) 75 MG tablet Take 1 tablet (75 mg total) by mouth daily. 06/08/14  Yes [name], MD  escitalopram (LEXAPRO) 20 MG tablet Take 1 tablet (20 mg total) by mouth daily. 03/28/14  Yes [name], MD  esomeprazole (NEXIUM) 40 MG capsule Take 40 mg by mouth daily at 12 noon.    Yes Historical Provider, MD  folic acid-pyridoxine-cyancobalamin (FOLBIC) 2.5-25-2 MG TABS Take 1 tablet by mouth daily.   Yes Historical Provider, MD  hydrochlorothiazide (HYDRODIURIL) 25 MG tablet Take 12.5 mg by mouth daily.   Yes Historical Provider, MD  HYDROcodone-acetaminophen (NORCO) 10-325 MG per tablet Take 1 tablet by mouth every 8 (eight) hours as needed for severe pain.   Yes Historical Provider, MD  metoprolol (LOPRESSOR) 50 MG tablet Take 50 mg by mouth 2 (two) times daily.   Yes Historical Provider, MD  nitroGLYCERIN (NITROSTAT) 0.4 MG SL tablet Place 1 tablet (0.4 mg total) under the tongue every 5 (five) minutes x 3 doses as needed for chest pain. 10/18/13 10/19/14 Yes [name], MD  rosuvastatin (CRESTOR) 10 MG tablet Take 1 tablet (10 mg total) by mouth daily. 10/18/13  Yes [name], MD  temazepam (RESTORIL) 30 MG capsule Take 30 mg by mouth at bedtime.   Yes Historical Provider, MD   Physical Exam: Filed Vitals:   07/02/14 1630  BP: 122/56  Pulse: 74  Temp:   Resp:     BP 122/56  Pulse 74  Temp(Src) 99 F (37.2 C) (Oral)  Resp 20  Ht 5' 1.81" (1.57 m)  Wt 73.5 kg (162 lb 0.6 oz)  BMI 29.82 kg/m2  SpO2 93%  General:  Appears calm and comfortable Eyes: PERRL, normal lids, irises & conjunctiva ENT: grossly normal hearing, lips & tongue Neck: no LAD, masses or thyromegaly Cardiovascular: RRR, no m/r/g. No LE edema. Respiratory: CTA bilaterally, no w/r/r. Normal respiratory effort. Abdomen: soft, ntnd Skin: She has a 3 cm purplish discolored bullea with mild purulence beneath the skin, with tenderness to passive movement of the finger and wrist and erythema extending to her wrist and a streak of redness in her arm. There's palpable lymphadenopathy in the axilla. Musculoskeletal: grossly normal tone BUE/BLE Psychiatric: grossly normal mood and affect, speech fluent and appropriate Neurologic: grossly non-focal.          Labs on Admission:  Basic  Metabolic Panel:  Recent Labs Lab 07/02/14 1404  NA 141  K 3.5*  CL 96  CO2 31  GLUCOSE 100*  BUN 11  CREATININE 0.69  CALCIUM 9.7   Liver Function Tests: No results found for this basename: AST, ALT, ALKPHOS, BILITOT, PROT, ALBUMIN,  in the last 168 hours No results found for this basename: LIPASE, AMYLASE,  in the last 168 hours No results found for this basename: AMMONIA,  in the last 168 hours CBC:  Recent Labs Lab 07/02/14 1404  WBC 9.2  NEUTROABS 7.6  HGB 14.2  HCT 41.5  MCV 94.5  PLT 137*   Cardiac Enzymes: No results found for this basename: CKTOTAL, CKMB, CKMBINDEX, TROPONINI,  in the last 168 hours  BNP (last 3 results)  Recent Labs  04/30/14 1838  PROBNP 350.7*   CBG: No results found for this basename: GLUCAP,  in the last 168 hours  Radiological Exams on Admission: No results found.  EKG: Independently reviewed: none  Assessment/Plan Cellulitis and abscess of hand/History of MRSA infection - She got one dose of clindamycin in the emergency room, was started on vancomycin IV. Get blood cultures x2. - Hold Plavix for today if no further procedures like I&D will be done can resume it tomorrow. - The ED has already consulted the hand surgeon. - An MRI of the right hand. Check hemoglobin A1c.  HYPERTENSION, BENIGN - cont home meds.   Code Status: full Family Communication: daughter and husband Disposition Plan: inpatient  Time spent: 61 minutes  Charlynne Cousins [facility] Pager [phone]  **Disclaimer: This note may have been dictated with voice recognition software. Similar sounding words can inadvertently be transcribed and this note may contain transcription errors which may not have been corrected upon publication of note.**

## 2014-07-02 NOTE — ED Provider Notes (Signed)
CSN: [id number]     Arrival date & time 07/02/14  0626 History   First MD Initiated Contact with Patient 07/02/14 [phone]     Chief Complaint  Patient presents with  . Abscess   (Consider location/radiation/quality/duration/timing/severity/associated sxs/prior Treatment) HPI Comments: 48 hours ago pt noticed a small red , tender area to the right index finger, extensor surface. Since that time there has been a rapid progression of swelling, erythema, red streaks and a large bullous to the index finger. Pain with flex and ext.   Past Medical History  Diagnosis Date  . Hypokalemia   . HTN (hypertension)   . Sleep apnea   . Endometrial polyp   . CAD (coronary artery disease)     a. NSTEMI 03/2012 ([place] 03/27/12: pLAD 30%, mLAD 99%, mCFX 95%, mRCA 99% with thrombus, EF 65%);  s/p PTCA/DESx1 to prox-mid RCA 03/27/12 urgently in setting of hypotension/bradycardia, with staged PTCA/Evolve study stent to mid LAD & PTCA/Evolve study stent to prox LCx 03/29/12 ;   echo 03/28/12:EF 60%, Aortic sclerosis without AS, mild RVE, mild reduced RVSF    . Hyperglycemia   . Hyperlipidemia   . Atrial tachycardia     a. noted at cardiac rehab 5/13; event monitor ordered to assess for AFib  . MI, old    Past Surgical History  Procedure Laterality Date  . Cholecystectomy  1978  . Appendectomy  73 years old  . Tonsillectomy  73 years old  . Cesarean section  1984  . Carotid stent  04/2012    x3   Family History  Problem Relation Age of Onset  . Coronary artery disease Mother     CABG at age 34  . Cancer Father     larynx   History  Substance Use Topics  . Smoking status: Former Smoker    Types: Cigarettes    Quit date: 04/19/1980  . Smokeless tobacco: Never Used  . Alcohol Use: Yes     Comment: socially   OB History   Grav Para Term Preterm Abortions TAB SAB Ect Mult Living                 Review of Systems  Constitutional: Positive for fever.  HENT: Negative.   Respiratory: Negative.    Gastrointestinal: Negative.   Genitourinary: Negative.   Skin: Positive for color change and wound.    Allergies  Penicillins; Sulfonamide derivatives; and Zinc oxide  Home Medications   Prior to Admission medications   Medication Sig Start Date End Date Taking? Authorizing Provider  ALPRAZolam Duanne Moron) 0.5 MG tablet Take 1 tablet (0.5 mg total) by mouth 2 (two) times daily as needed for sleep. 03/24/13  Yes [name], MD  amLODipine (NORVASC) 5 MG tablet Take 5 mg by mouth daily.   Yes Historical Provider, MD  aspirin 81 MG tablet Take 1 tablet (81 mg total) by mouth daily. 03/30/12  Yes Dayna N Dunn, PA-C  Calcium Carbonate-Vitamin D (CALTRATE 600+D) 600-400 MG-UNIT per tablet Take 1 tablet by mouth 2 (two) times daily.    Yes Historical Provider, MD  clopidogrel (PLAVIX) 75 MG tablet Take 1 tablet (75 mg total) by mouth daily. 06/08/14  Yes [name], MD  escitalopram (LEXAPRO) 20 MG tablet Take 1 tablet (20 mg total) by mouth daily. 03/28/14  Yes [name], MD  esomeprazole (NEXIUM) 40 MG capsule Take 40 mg by mouth daily at 12 noon.   Yes Historical Provider, MD  folic acid-pyridoxine-cyancobalamin (FOLBIC) 2.5-25-2  MG TABS Take 1 tablet by mouth daily.   Yes Historical Provider, MD  hydrochlorothiazide (HYDRODIURIL) 25 MG tablet Take 12.5 mg by mouth daily.   Yes Historical Provider, MD  metoprolol (LOPRESSOR) 50 MG tablet TAKE 1 TABLET (50 MG TOTAL) BY MOUTH 2 (TWO) TIMES DAILY. 06/11/14  Yes [name], MD  nitroGLYCERIN (NITROSTAT) 0.4 MG SL tablet Place 1 tablet (0.4 mg total) under the tongue every 5 (five) minutes x 3 doses as needed for chest pain. 10/18/13 10/19/14 Yes [name], MD  rosuvastatin (CRESTOR) 10 MG tablet Take 1 tablet (10 mg total) by mouth daily. 10/18/13  Yes [name], MD  temazepam (RESTORIL) 30 MG capsule Take 1 capsule (30 mg total) by mouth at bedtime as needed for sleep. 05/21/14  Yes [name], MD  acetaminophen (TYLENOL) 500 MG  tablet Take 1,000 mg by mouth every 6 (six) hours as needed for moderate pain. For pain     Historical Provider, MD  HYDROcodone-acetaminophen (NORCO) 10-325 MG per tablet Take 1 tablet by mouth every 8 (eight) hours as needed for severe pain.    Historical Provider, MD   BP 153/85  Pulse 70  Temp(Src) 100.5 F (38.1 C) (Oral)  Resp 16  SpO2 97% Physical Exam  Nursing note and vitals reviewed. Constitutional: She is oriented to person, place, and time. She appears well-developed and well-nourished. No distress.  Cardiovascular: Normal rate.   Pulmonary/Chest: Effort normal. No respiratory distress.  Musculoskeletal:  Swelling, erythema, tenderness with purplish vesicle to the R index finger. Erythema extends into the hand with lymphangitis.   Neurological: She is alert and oriented to person, place, and time. She exhibits normal muscle tone.  Skin: Skin is warm and dry. There is erythema.  Psychiatric: She has a normal mood and affect.    ED Course  Procedures (including critical care time) Labs Review Labs Reviewed - No data to display  Imaging Review No results found.   MDM   1. Cellulitis of second finger of right hand   2. Abscess of second finger, right    Transfer to the ED for Cellulitis protocol of finger and hand cellulitis. Query tenosynovitis.    Janne Napoleon, NP 07/02/14 1023

## 2014-07-02 NOTE — Consult Note (Signed)
ANTIBIOTIC CONSULT NOTE - INITIAL  Pharmacy Consult for Vancomycin Indication: cellulitis  Allergies  Allergen Reactions  . Penicillins Hives and Swelling  . Sulfonamide Derivatives Hives and Swelling  . Zinc Oxide Rash    Patient Measurements: Height: 5' 1.81" (157 cm) Weight: 162 lb 0.6 oz (73.5 kg) IBW/kg (Calculated) : 49.67  Vital Signs: Temp: 99 F (37.2 C) (07/13 1101) Temp src: Oral (07/13 1101) BP: 122/56 mmHg (07/13 1630) Pulse Rate: 74 (07/13 1630) Intake/Output from previous day:   Intake/Output from this shift:    Labs:  Recent Labs  07/02/14 1404  WBC 9.2  HGB 14.2  PLT 137*  CREATININE 0.69   Estimated Creatinine Clearance: 58.5 ml/min (by C-G formula based on Cr of 0.69).  Microbiology: No results found for this or any previous visit (from the past 720 hour(s)).  Medical History: Past Medical History  Diagnosis Date  . Hypokalemia   . HTN (hypertension)   . Sleep apnea   . Endometrial polyp   . CAD (coronary artery disease)     a. NSTEMI 03/2012 ([place] 03/27/12: pLAD 30%, mLAD 99%, mCFX 95%, mRCA 99% with thrombus, EF 65%);  s/p PTCA/DESx1 to prox-mid RCA 03/27/12 urgently in setting of hypotension/bradycardia, with staged PTCA/Evolve study stent to mid LAD & PTCA/Evolve study stent to prox LCx 03/29/12 ;   echo 03/28/12:EF 60%, Aortic sclerosis without AS, mild RVE, mild reduced RVSF    . Hyperglycemia   . Hyperlipidemia   . Atrial tachycardia     a. noted at cardiac rehab 5/13; event monitor ordered to assess for AFib  . MI, old    Assessment: 72yof [facility] with swelling, erythema, and a large bullous to her right index finger. She will begin vancomycin for cellulitis. Received 600mg  IV clindamycin in the ED.  Renal function wnl.  Goal of Therapy:  Vancomycin trough level 10-15 mcg/ml  Plan:  1) Vancomycin 1250mg  IV q24 2) Follow renal function, cultures, LOT, trough at steady state  Deboraha Sprang 07/02/2014,4:51  PM

## 2014-07-02 NOTE — ED Notes (Signed)
Attempted Iv x2. Patient made aware she will have blood drawn and then if needed IV will be placed. Patient verbalizes consent and understanding.

## 2014-07-02 NOTE — Discharge Instructions (Signed)
Wash wound normally in shower, letting soap/water run over it Re-apply bacitracin ointment and light gauze dressing. Move fingers into full fist and then fully extend fingers several times daily

## 2014-07-02 NOTE — ED Notes (Signed)
Pt states that she has a history of MRSA infections.   Mw,cma

## 2014-07-02 NOTE — Progress Notes (Signed)
Carrie Chung [id number] Code Status: Full   Admission Data: 07/02/2014 6:07 PM Attending Provider:  Aileen Fass QQI:WLNLGX,QJJH [name], MD Consults/ Treatment Team:    [name] is a 73 y.o. female patient admitted from ED awake, alert - oriented  X 3 - no acute distress noted.  VSS - Blood pressure 138/78, pulse 77, temperature 100.2 F (37.9 C), temperature source Oral, resp. rate 20, height 5\' 1"  (1.549 m), weight 63.957 kg (141 lb), SpO2 94.00%.    IV in place, occlusive dsg intact without redness.  Orientation to room, and floor completed with information packet given to patient/family. Admission INP armband ID verified with patient/family, and in place.   SR up x 2, fall assessment complete, with patient and family able to verbalize understanding of risk associated with falls, and verbalized understanding to call nsg before up out of bed.  Call light within reach, patient able to voice, and demonstrate understanding.       Will cont to eval and treat per MD orders.  [name], [place] 07/02/2014 6:07 PM

## 2014-07-02 NOTE — Consult Note (Signed)
ORTHOPAEDIC CONSULTATION HISTORY & PHYSICAL REQUESTING PHYSICIAN: Charlynne Cousins, MD  Chief Complaint: right IF lesion  HPI: Carrie Chung is a 73 y.o. female who presented to ED with R dorsal IF bulla.  Started on 7-11 as flat "freckle", and spread, really advancing yesterday/today.  No known insect bite.  Right hand swollen and painful, now admitted for IV antibiotics.  Advised not to take NSAIDs due to plavix/ASA.  Past Medical History  Diagnosis Date  . Hypokalemia   . HTN (hypertension)   . Sleep apnea   . Endometrial polyp   . CAD (coronary artery disease)     a. NSTEMI 03/2012 ([place] 03/27/12: pLAD 30%, mLAD 99%, mCFX 95%, mRCA 99% with thrombus, EF 65%);  s/p PTCA/DESx1 to prox-mid RCA 03/27/12 urgently in setting of hypotension/bradycardia, with staged PTCA/Evolve study stent to mid LAD & PTCA/Evolve study stent to prox LCx 03/29/12 ;   echo 03/28/12:EF 60%, Aortic sclerosis without AS, mild RVE, mild reduced RVSF    . Hyperglycemia   . Hyperlipidemia   . Atrial tachycardia     a. noted at cardiac rehab 5/13; event monitor ordered to assess for AFib  . MI, old    Past Surgical History  Procedure Laterality Date  . Cholecystectomy  1978  . Appendectomy  73 years old  . Tonsillectomy  73 years old  . Cesarean section  1984  . Carotid stent  04/2012    x3   History   Social History  . Marital Status: Married    Spouse Name: N/A    Number of Children: N/A  . Years of Education: N/A   Occupational History  . Retired Marine scientist    Social History Main Topics  . Smoking status: Former Smoker    Types: Cigarettes    Quit date: 04/19/1980  . Smokeless tobacco: Never Used  . Alcohol Use: Yes     Comment: socially  . Drug Use: No  . Sexual Activity: No   Other Topics Concern  . None   Social History Narrative  . None   Family History  Problem Relation Age of Onset  . Coronary artery disease Mother     CABG at age 21  . Cancer Father     larynx   Allergies    Allergen Reactions  . Penicillins Hives and Swelling  . Sulfonamide Derivatives Hives and Swelling  . Zinc Oxide Rash   Prior to Admission medications   Medication Sig Start Date End Date Taking? Authorizing Provider  ALPRAZolam Duanne Moron) 0.5 MG tablet Take 0.5 mg by mouth 2 (two) times daily as needed for anxiety or sleep. 03/24/13  Yes [name], MD  amLODipine (NORVASC) 5 MG tablet Take 5 mg by mouth daily.   Yes Historical Provider, MD  aspirin 81 MG tablet Take 1 tablet (81 mg total) by mouth daily. 03/30/12  Yes Dayna N Dunn, PA-C  Calcium Carbonate-Vitamin D (CALTRATE 600+D) 600-400 MG-UNIT per tablet Take 1 tablet by mouth 2 (two) times daily.    Yes Historical Provider, MD  clopidogrel (PLAVIX) 75 MG tablet Take 1 tablet (75 mg total) by mouth daily. 06/08/14  Yes [name], MD  escitalopram (LEXAPRO) 20 MG tablet Take 1 tablet (20 mg total) by mouth daily. 03/28/14  Yes [name], MD  esomeprazole (NEXIUM) 40 MG capsule Take 40 mg by mouth daily at 12 noon.   Yes Historical Provider, MD  folic acid-pyridoxine-cyancobalamin (FOLBIC) 2.5-25-2 MG TABS Take 1 tablet by mouth  daily.   Yes Historical Provider, MD  hydrochlorothiazide (HYDRODIURIL) 25 MG tablet Take 12.5 mg by mouth daily.   Yes Historical Provider, MD  HYDROcodone-acetaminophen (NORCO) 10-325 MG per tablet Take 1 tablet by mouth every 8 (eight) hours as needed for severe pain.   Yes Historical Provider, MD  metoprolol (LOPRESSOR) 50 MG tablet Take 50 mg by mouth 2 (two) times daily.   Yes Historical Provider, MD  nitroGLYCERIN (NITROSTAT) 0.4 MG SL tablet Place 1 tablet (0.4 mg total) under the tongue every 5 (five) minutes x 3 doses as needed for chest pain. 10/18/13 10/19/14 Yes [name], MD  rosuvastatin (CRESTOR) 10 MG tablet Take 1 tablet (10 mg total) by mouth daily. 10/18/13  Yes [name], MD  temazepam (RESTORIL) 30 MG capsule Take 30 mg by mouth at bedtime.   Yes Historical Provider, MD   No  results found.  Positive ROS: All other systems have been reviewed and were otherwise negative with the exception of those mentioned in the HPI and as above.  Physical Exam: Vitals: Refer to EMR. Constitutional:  WD, WN, NAD HEENT:  NCAT, EOMI Neuro/Psych:  Alert & oriented to person, place, and time; appropriate mood & affect Lymphatic: No generalized extremity edema or lymphadenopathy Extremities / MSK:  The extremities are normal with respect to appearance, ranges of motion, joint stability, muscle strength/tone, sensation, & perfusion except as otherwise noted:  R IF 2.5 cm round lesion with intact dermis over dorsum of P1, no expressible purulence, and well demarcated boundary.  Bulla roof has been debrided already.  IF a little swollen and tender, not fusiform.  No fluctuance.  Mild redness of dorsum of hand and mild edema.  Assessment: R IF bullous lesion--quite possibly a spider bite.  Recommendations: 1. May d/c on oral antibiotics once adequate response to IV antibiotics. 2. Wound care with bacitracin ointment/gauze. 3. Elevate fingers higher than elbow. 4.  May f/u with me in approx 1 week for wound check and assessment of function. 5. Please call if patient's condition worsens.   Carrie Chung, [place], [place]  [address] Office: [phone] Mobile: [phone]

## 2014-07-02 NOTE — ED Notes (Signed)
NT Debbie transporting patient upstairs.

## 2014-07-02 NOTE — ED Notes (Signed)
Hospitalist at the bedside 

## 2014-07-02 NOTE — ED Notes (Signed)
Pt from [facility]: 48 hours ago pt noticed a small red , tender area to the right index finger. Reports rapid progression of swelling, erythema, red streaks and a large bullous to the index finger. Pain with flex and ext. NAD. AO x4.

## 2014-07-02 NOTE — ED Notes (Signed)
MD at the bedside  

## 2014-07-03 ENCOUNTER — Inpatient Hospital Stay (HOSPITAL_COMMUNITY): Payer: Medicare Other

## 2014-07-03 LAB — CBC
HEMATOCRIT: 38.5 % (ref 36.0–46.0)
Hemoglobin: 13 g/dL (ref 12.0–15.0)
MCH: 32.2 pg (ref 26.0–34.0)
MCHC: 33.8 g/dL (ref 30.0–36.0)
MCV: 95.3 fL (ref 78.0–100.0)
Platelets: 108 10*3/uL — ABNORMAL LOW (ref 150–400)
RBC: 4.04 MIL/uL (ref 3.87–5.11)
RDW: 12.8 % (ref 11.5–15.5)
WBC: 9.6 10*3/uL (ref 4.0–10.5)

## 2014-07-03 LAB — HEMOGLOBIN A1C
Hgb A1c MFr Bld: 5.6 % (ref <5.7)
Mean Plasma Glucose: 114 mg/dL (ref <117)

## 2014-07-03 MED ORDER — BACITRACIN ZINC 500 UNIT/GM EX OINT
TOPICAL_OINTMENT | Freq: Every day | CUTANEOUS | Status: DC
  Administered 2014-07-03 – 2014-07-04 (×2): via TOPICAL
  Filled 2014-07-03: qty 28.35

## 2014-07-03 MED ORDER — FLUCONAZOLE 100 MG PO TABS
100.0000 mg | ORAL_TABLET | Freq: Every day | ORAL | Status: DC
  Administered 2014-07-03: 100 mg via ORAL
  Filled 2014-07-03 (×2): qty 1

## 2014-07-03 NOTE — Progress Notes (Signed)
[facility] PROGRESS NOTE  Carrie Chung KZS:[id number] DOB: [birth year] DOA: 07/02/2014 PCP: [name], MD  Assessment/Plan: 1. R hand cellulitis 1. On vanc 2. Ortho was consulted with recs for transitioning to PO abx when stable 3. Pt with continued fevers overnight, no leukocytosis 4. When pt is afebrile for 24hrs, consider d/c home with PO abx with close outpatient follow up with Ortho 2. HTN 1. Stable 2. Cont current regimen 3. CAD 1. Stable 2. Asymptomatic 4. DVT prophylaxis 1. Heparin subQ  Code Status: Full Family Communication: Pt in room (indicate person spoken with, relationship, and if by phone, the number) Disposition Plan: Pending resolution of fevers   Consultants:  Ortho  Procedures:    Antibiotics:  Vanc  HPI/Subjective: Reports feeling generally ill. Febrile overnight.  Objective: Filed Vitals:   07/02/14 2107 07/02/14 2250 07/03/14 0413 07/03/14 0946  BP: 145/79  129/70 139/79  Pulse: 84  83 77  Temp: 101 F (38.3 C) 100.6 F (38.1 C) 100.9 F (38.3 C)   TempSrc: Oral  Oral   Resp: 18  18   Height:      Weight:      SpO2: 95%  93%     Intake/Output Summary (Last 24 hours) at 07/03/14 1253 Last data filed at 07/03/14 0900  Gross per 24 hour  Intake    240 ml  Output      0 ml  Net    240 ml   Filed Weights   07/02/14 1630 07/02/14 1741  Weight: 162 lb 0.6 oz (73.5 kg) 141 lb (63.957 kg)    Exam:   General:  Awake, in nad  Cardiovascular: regular, s1, s2  Respiratory: normal resp effort, no wheezing  Abdomen: soft, nondistended  Musculoskeletal: perfused, tender erythema over R hand   Data Reviewed: Basic Metabolic Panel:  Recent Labs Lab 07/02/14 1404  NA 141  K 3.5*  CL 96  CO2 31  GLUCOSE 100*  BUN 11  CREATININE 0.69  CALCIUM 9.7   Liver Function Tests: No results found for this basename: AST, ALT, ALKPHOS, BILITOT, PROT, ALBUMIN,  in the last 168 hours No results found for this  basename: LIPASE, AMYLASE,  in the last 168 hours No results found for this basename: AMMONIA,  in the last 168 hours CBC:  Recent Labs Lab 07/02/14 1404 07/03/14 0550  WBC 9.2 9.6  NEUTROABS 7.6  --   HGB 14.2 13.0  HCT 41.5 38.5  MCV 94.5 95.3  PLT 137* 108*   Cardiac Enzymes: No results found for this basename: CKTOTAL, CKMB, CKMBINDEX, TROPONINI,  in the last 168 hours BNP (last 3 results)  Recent Labs  04/30/14 1838  PROBNP 350.7*   CBG: No results found for this basename: GLUCAP,  in the last 168 hours  No results found for this or any previous visit (from the past 240 hour(s)).   Studies: No results found.  Scheduled Meds: . amLODipine  5 mg Oral Daily  . aspirin EC  81 mg Oral Daily  . atorvastatin  40 mg Oral q1800  . bacitracin   Topical Daily  . clopidogrel  75 mg Oral Daily  . escitalopram  20 mg Oral Daily  . fluconazole  100 mg Oral Daily  . heparin  5,000 Units Subcutaneous 3 times per day  . metoprolol  50 mg Oral BID  . pantoprazole  40 mg Oral Daily  . sodium chloride  3 mL Intravenous Q12H  . temazepam  30 mg  Oral QHS  . vancomycin  1,250 mg Intravenous Q24H   Continuous Infusions:   Principal Problem:   Cellulitis and abscess of hand Active Problems:   HYPERTENSION, BENIGN   History of MRSA infection   Cellulitis of right hand   Bullae  Time spent: 21min  [facility], [facility] Pager [phone]. If 7PM-7AM, please contact night-coverage at [email or web address], password [facility] 07/03/2014, 12:53 PM  LOS: 1 day

## 2014-07-03 NOTE — Progress Notes (Signed)
Notified Schorr, NP of patients temp 101 oral. Pt given 650 mg prn tylenol. Will continue to monitor.

## 2014-07-03 NOTE — ED Provider Notes (Signed)
Medical screening examination/treatment/procedure(s) were performed by non-physician practitioner and as supervising physician I was immediately available for consultation/collaboration.  Philipp Deputy, M.D.  Harden Mo, MD 07/03/14 1430

## 2014-07-04 ENCOUNTER — Encounter (HOSPITAL_COMMUNITY): Payer: Medicare Other

## 2014-07-04 DIAGNOSIS — I2510 Atherosclerotic heart disease of native coronary artery without angina pectoris: Secondary | ICD-10-CM

## 2014-07-04 DIAGNOSIS — I209 Angina pectoris, unspecified: Secondary | ICD-10-CM

## 2014-07-04 MED ORDER — VANCOMYCIN HCL 10 G IV SOLR
1250.0000 mg | INTRAVENOUS | Status: DC
  Administered 2014-07-04: 1250 mg via INTRAVENOUS
  Filled 2014-07-04: qty 1250

## 2014-07-04 MED ORDER — BACITRACIN ZINC 500 UNIT/GM EX OINT: TOPICAL_OINTMENT | Freq: Every day | CUTANEOUS | Status: DC

## 2014-07-04 MED ORDER — DOXYCYCLINE HYCLATE 100 MG PO CAPS: 100.0000 mg | ORAL_CAPSULE | Freq: Two times a day (BID) | ORAL | Status: DC

## 2014-07-04 MED ORDER — HYDROCODONE-ACETAMINOPHEN 10-325 MG PO TABS: 1.0000 | ORAL_TABLET | Freq: Three times a day (TID) | ORAL | Status: DC | PRN

## 2014-07-04 NOTE — Discharge Summary (Signed)
PATIENT DETAILS Name: Carrie Chung Age: 73 y.o. Sex: female Date of Birth: May 02, 1941 MRN: [id number]. Admit Date: 07/02/2014 Admitting Physician: Charlynne Cousins, MD WEX:HBZJIR,CVEL J, MD  Recommendations for Outpatient Follow-up:  1. Followup with hand surgery arranged 7/23. Continue doxycycline for 7 days from 7/16. 2. Appears to have developed a mild thrombocytopenia, please continue to monitor in the outpatient setting. 3. Gen. health maintenance.  PRIMARY DISCHARGE DIAGNOSIS:  Principal Problem:   Cellulitis and abscess of hand Active Problems:   HYPERTENSION, BENIGN   History of MRSA infection   Cellulitis of right hand   Bullae      PAST MEDICAL HISTORY: Past Medical History  Diagnosis Date  . Hypokalemia   . HTN (hypertension)   . Sleep apnea   . Endometrial polyp   . CAD (coronary artery disease)     a. NSTEMI 03/2012 ([place] 03/27/12: pLAD 30%, mLAD 99%, mCFX 95%, mRCA 99% with thrombus, EF 65%);  s/p PTCA/DESx1 to prox-mid RCA 03/27/12 urgently in setting of hypotension/bradycardia, with staged PTCA/Evolve study stent to mid LAD & PTCA/Evolve study stent to prox LCx 03/29/12 ;   echo 03/28/12:EF 60%, Aortic sclerosis without AS, mild RVE, mild reduced RVSF    . Hyperglycemia   . Hyperlipidemia   . Atrial tachycardia     a. noted at cardiac rehab 5/13; event monitor ordered to assess for AFib  . MI, old     DISCHARGE MEDICATIONS:   Medication List         ALPRAZolam 0.5 MG tablet  Commonly known as:  XANAX  Take 0.5 mg by mouth 2 (two) times daily as needed for anxiety or sleep.     amLODipine 5 MG tablet  Commonly known as:  NORVASC  Take 5 mg by mouth daily.     aspirin 81 MG tablet  Take 1 tablet (81 mg total) by mouth daily.     bacitracin ointment  Apply topically daily.     CALTRATE 600+D 600-400 MG-UNIT per tablet  Generic drug:  Calcium Carbonate-Vitamin D  Take 1 tablet by mouth 2 (two) times daily.     clopidogrel 75 MG tablet    Commonly known as:  PLAVIX  Take 1 tablet (75 mg total) by mouth daily.     doxycycline 100 MG capsule  Commonly known as:  VIBRAMYCIN  Take 1 capsule (100 mg total) by mouth 2 (two) times daily.  Start taking on:  07/05/2014     escitalopram 20 MG tablet  Commonly known as:  LEXAPRO  Take 1 tablet (20 mg total) by mouth daily.     esomeprazole 40 MG capsule  Commonly known as:  NEXIUM  Take 40 mg by mouth daily at 12 noon.     FOLBIC 2.5-25-2 MG Tabs  Generic drug:  folic acid-pyridoxine-cyancobalamin  Take 1 tablet by mouth daily.     hydrochlorothiazide 25 MG tablet  Commonly known as:  HYDRODIURIL  Take 12.5 mg by mouth daily.     HYDROcodone-acetaminophen 10-325 MG per tablet  Commonly known as:  NORCO  Take 1 tablet by mouth every 8 (eight) hours as needed for severe pain.     metoprolol 50 MG tablet  Commonly known as:  LOPRESSOR  Take 50 mg by mouth 2 (two) times daily.     nitroGLYCERIN 0.4 MG SL tablet  Commonly known as:  NITROSTAT  Place 1 tablet (0.4 mg total) under the tongue every 5 (five) minutes x 3 doses as needed  for chest pain.     rosuvastatin 10 MG tablet  Commonly known as:  CRESTOR  Take 1 tablet (10 mg total) by mouth daily.     temazepam 30 MG capsule  Commonly known as:  RESTORIL  Take 30 mg by mouth at bedtime.        ALLERGIES:   Allergies  Allergen Reactions  . Penicillins Hives and Swelling  . Sulfonamide Derivatives Hives and Swelling  . Zinc Oxide Rash    BRIEF HPI:  See H&P, Labs, Consult and Test reports for all details in brief, patient is a 73 year old female with history of coronary artery disease, hypertension, who was admitted on 7/13 with right hand pain and swelling. She was admitted for further evaluation and treatment  CONSULTATIONS:   orthopedic surgery  PERTINENT RADIOLOGIC STUDIES: No results found.   PERTINENT LAB RESULTS: CBC:  Recent Labs  07/02/14 1404 07/03/14 0550  WBC 9.2 9.6  HGB 14.2 13.0   HCT 41.5 38.5  PLT 137* 108*   CMET CMP     Component Value Date/Time   NA 141 07/02/2014 1404   K 3.5* 07/02/2014 1404   CL 96 07/02/2014 1404   CO2 31 07/02/2014 1404   GLUCOSE 100* 07/02/2014 1404   BUN 11 07/02/2014 1404   CREATININE 0.69 07/02/2014 1404   CREATININE 0.70 10/20/2013 1357   CALCIUM 9.7 07/02/2014 1404   PROT 7.1 05/01/2014 0620   ALBUMIN 3.9 05/01/2014 0620   AST 23 05/01/2014 0620   ALT 14 05/01/2014 0620   ALKPHOS 72 05/01/2014 0620   BILITOT 0.7 05/01/2014 0620   GFRNONAA 84* 07/02/2014 1404   GFRAA >90 07/02/2014 1404    GFR Estimated Creatinine Clearance: 53.7 ml/min (by C-G formula based on Cr of 0.69). No results found for this basename: LIPASE, AMYLASE,  in the last 72 hours No results found for this basename: CKTOTAL, CKMB, CKMBINDEX, TROPONINI,  in the last 72 hours No components found with this basename: POCBNP,  No results found for this basename: DDIMER,  in the last 72 hours  Recent Labs  07/02/14 1850  HGBA1C 5.6   No results found for this basename: CHOL, HDL, LDLCALC, TRIG, CHOLHDL, LDLDIRECT,  in the last 72 hours No results found for this basename: TSH, T4TOTAL, FREET3, T3FREE, THYROIDAB,  in the last 72 hours No results found for this basename: VITAMINB12, FOLATE, FERRITIN, TIBC, IRON, RETICCTPCT,  in the last 72 hours Coags: No results found for this basename: PT, INR,  in the last 72 hours Microbiology: Recent Results (from the past 240 hour(s))  CULTURE, BLOOD (ROUTINE X 2)     Status: None   Collection Time    07/02/14  6:50 PM      Result Value Ref Range Status   Specimen Description BLOOD LEFT HAND   Final   Special Requests BOTTLES DRAWN AEROBIC ONLY 4CC   Final   Culture  Setup Time     Final   Value: 07/03/2014 00:46     Performed at [facility]   Culture     Final   Value:        BLOOD CULTURE RECEIVED NO GROWTH TO DATE CULTURE WILL BE HELD FOR 5 DAYS BEFORE ISSUING A FINAL NEGATIVE REPORT     Performed at [facility]   Report Status PENDING   Incomplete  CULTURE, BLOOD (ROUTINE X 2)     Status: None   Collection Time    07/02/14  6:58  PM      Result Value Ref Range Status   Specimen Description BLOOD LEFT HAND   Final   Special Requests BOTTLES DRAWN AEROBIC ONLY 4CC   Final   Culture  Setup Time     Final   Value: 07/03/2014 00:46     Performed at [facility]   Culture     Final   Value:        BLOOD CULTURE RECEIVED NO GROWTH TO DATE CULTURE WILL BE HELD FOR 5 DAYS BEFORE ISSUING A FINAL NEGATIVE REPORT     Performed at [facility]   Report Status PENDING   Incomplete     BRIEF HOSPITAL COURSE:   Principal Problem:   Cellulitis and abscess of hand and right index finger - Patient presented with a bullous lesion in the right index finger, with cellulitis and lymphangitis changes extending into the wrist and hand. Patient was admitted, started on IV vancomycin. Blood cultures were obtained, negative at the time of discharge. Orthopedics was consulted, current recommendations were to continue with antibiotics, and discharged home in significantly improved. Wound care instructions are as below. On the day of discharge, patient reports significant improvement in her clinical condition, there is no evidence of erythema in the wrist area oral forearm area. There is some mild erythema in the dorsal aspect of the right index finger, where a open wound now is present. This was a bullous area that has now been de-roofed. Since she is significantly improved, has been afebrile for the past 24 hours, all cultures are negative, she will be transitioned to oral doxycycline and discharged home. A followup appointment with Dr. Alberteen Sam surgery-has been arranged by this M.D. for 7/23.  Active Problems:   HYPERTENSION, BENIGN - Stable, continue with prior home regimen.  History of CAD - Stable, continue with antiplatelet agents.  TODAY-DAY OF DISCHARGE:  Subjective:   Carlota  Gasparyan today has no headache,no chest abdominal pain,no new weakness tingling or numbness, feels much better wants to go home today.   Objective:   Blood pressure 128/64, pulse 67, temperature 99.2 F (37.3 C), temperature source Oral, resp. rate 18, height 5\' 1"  (1.549 m), weight 63.957 kg (141 lb), SpO2 97.00%.  Intake/Output Summary (Last 24 hours) at 07/04/14 1122 Last data filed at 07/04/14 0900  Gross per 24 hour  Intake    740 ml  Output      0 ml  Net    740 ml   Filed Weights   07/02/14 1630 07/02/14 1741  Weight: 73.5 kg (162 lb 0.6 oz) 63.957 kg (141 lb)    Exam Awake Alert, Oriented *3, No new F.N deficits, Normal affect [place].AT,PERRAL Supple Neck,No JVD, No cervical lymphadenopathy appriciated.  Symmetrical Chest wall movement, Good air movement bilaterally, CTAB RRR,No Gallops,Rubs or new Murmurs, No Parasternal Heave +ve B.Sounds, Abd Soft, Non tender, No organomegaly appriciated, No rebound -guarding or rigidity. No Cyanosis, Clubbing or edema, No new Rash or bruise Dorsal aspect of the right index finger-2.5 cm rounded lesion, without any expressible purulence. No swelling in the wrist, or dorsal aspect of the hand. No evidence of lymphangitis or cellulitis and beforehand. Only mild erythema surrounding the immediate lesion in the dorsal aspect of the right index finger.  DISCHARGE CONDITION: Stable  DISPOSITION: Home  DISCHARGE INSTRUCTIONS:    Activity:  As tolerated   Diet recommendation: Heart Healthy diet      Discharge Instructions   Call MD for:  redness, tenderness,  or signs of infection (pain, swelling, redness, odor or green/yellow discharge around incision site)    Complete by:  As directed      Call MD for:  temperature >100.4    Complete by:  As directed      Diet - low sodium heart healthy    Complete by:  As directed      Discharge wound care:    Complete by:  As directed   Wash wound normally in shower, letting soap/water run over  it Re-apply bacitracin ointment and light gauze dressing. Move fingers into full fist and then fully extend fingers several times daily     Increase activity slowly    Complete by:  As directed            Follow-up Information   Follow up with Jolyn Nap., MD On 07/12/2014. (appt at 9 am-but be there at 8:30)    Specialty:  Orthopedic Surgery   Contact information:   [place] [address] [phone]       Follow up with [name], MD. Schedule an appointment as soon as possible for a visit in 1 week.   Specialty:  Internal Medicine   Contact information:   [address] [address] [phone]      Total Time spent on discharge equals 45 minutes.  SignedOren Binet 07/04/2014 11:22 AM  **Disclaimer: This note may have been dictated with voice recognition software. Similar sounding words can inadvertently be transcribed and this note may contain transcription errors which may not have been corrected upon publication of note.**

## 2014-07-04 NOTE — Progress Notes (Signed)
NURSING PROGRESS NOTE  Carrie Chung [id number] Discharge Data: 07/04/2014 1:58 PM Attending Provider: Jonetta Osgood, MD PYK:DXIPJA,SNKN J, MD     [name] to be D/C'd Home per MD order.  Discussed with the patient the After Visit Summary and all questions fully answered. All IV's discontinued with no bleeding noted. All belongings returned to patient for patient to take home.   Last Vital Signs:  Blood pressure 128/64, pulse 67, temperature 99.2 F (37.3 C), temperature source Oral, resp. rate 18, height 5\' 1"  (1.549 m), weight 63.957 kg (141 lb), SpO2 97.00%.  Discharge Medication List   Medication List         ALPRAZolam 0.5 MG tablet  Commonly known as:  XANAX  Take 0.5 mg by mouth 2 (two) times daily as needed for anxiety or sleep.     amLODipine 5 MG tablet  Commonly known as:  NORVASC  Take 5 mg by mouth daily.     aspirin 81 MG tablet  Take 1 tablet (81 mg total) by mouth daily.     bacitracin ointment  Apply topically daily.     CALTRATE 600+D 600-400 MG-UNIT per tablet  Generic drug:  Calcium Carbonate-Vitamin D  Take 1 tablet by mouth 2 (two) times daily.     clopidogrel 75 MG tablet  Commonly known as:  PLAVIX  Take 1 tablet (75 mg total) by mouth daily.     doxycycline 100 MG capsule  Commonly known as:  VIBRAMYCIN  Take 1 capsule (100 mg total) by mouth 2 (two) times daily.  Start taking on:  07/05/2014     escitalopram 20 MG tablet  Commonly known as:  LEXAPRO  Take 1 tablet (20 mg total) by mouth daily.     esomeprazole 40 MG capsule  Commonly known as:  NEXIUM  Take 40 mg by mouth daily at 12 noon.     FOLBIC 2.5-25-2 MG Tabs  Generic drug:  folic acid-pyridoxine-cyancobalamin  Take 1 tablet by mouth daily.     hydrochlorothiazide 25 MG tablet  Commonly known as:  HYDRODIURIL  Take 12.5 mg by mouth daily.     HYDROcodone-acetaminophen 10-325 MG per tablet  Commonly known as:  NORCO  Take 1 tablet by mouth every 8 (eight)  hours as needed for severe pain.     metoprolol 50 MG tablet  Commonly known as:  LOPRESSOR  Take 50 mg by mouth 2 (two) times daily.     nitroGLYCERIN 0.4 MG SL tablet  Commonly known as:  NITROSTAT  Place 1 tablet (0.4 mg total) under the tongue every 5 (five) minutes x 3 doses as needed for chest pain.     rosuvastatin 10 MG tablet  Commonly known as:  CRESTOR  Take 1 tablet (10 mg total) by mouth daily.     temazepam 30 MG capsule  Commonly known as:  RESTORIL  Take 30 mg by mouth at bedtime.         Wallie Renshaw, RN

## 2014-07-05 ENCOUNTER — Telehealth: Payer: Self-pay

## 2014-07-05 NOTE — Telephone Encounter (Signed)
Patient informed we will see her tomorrow at 11:45 am.

## 2014-07-05 NOTE — Telephone Encounter (Signed)
Patient calls wanting to make an appointment with Dr. Renold Genta re: infection in hand. Advised her she needed to followup with Dr. Grandville Silos, but states she does not want to go back to him. Either wants to see you or get a referral from you for Dr. Fredna Dow.

## 2014-07-05 NOTE — Telephone Encounter (Signed)
Make appt tomorrow.

## 2014-07-06 ENCOUNTER — Encounter: Payer: Self-pay | Admitting: Internal Medicine

## 2014-07-06 ENCOUNTER — Encounter (HOSPITAL_COMMUNITY): Payer: Medicare Other

## 2014-07-06 ENCOUNTER — Ambulatory Visit (INDEPENDENT_AMBULATORY_CARE_PROVIDER_SITE_OTHER): Payer: Medicare Other | Admitting: Internal Medicine

## 2014-07-06 VITALS — BP 140/68 | HR 60 | Temp 98.4°F | Wt 159.0 lb

## 2014-07-06 DIAGNOSIS — L02519 Cutaneous abscess of unspecified hand: Secondary | ICD-10-CM

## 2014-07-06 DIAGNOSIS — L03011 Cellulitis of right finger: Secondary | ICD-10-CM

## 2014-07-06 DIAGNOSIS — I2510 Atherosclerotic heart disease of native coronary artery without angina pectoris: Secondary | ICD-10-CM

## 2014-07-06 DIAGNOSIS — L03019 Cellulitis of unspecified finger: Secondary | ICD-10-CM | POA: Diagnosis not present

## 2014-07-06 DIAGNOSIS — L089 Local infection of the skin and subcutaneous tissue, unspecified: Secondary | ICD-10-CM

## 2014-07-06 NOTE — Progress Notes (Signed)
   Subjective:    Patient ID: Carrie Chung, female    DOB: 09/28/1941, 73 y.o.   MRN: [id number]  HPI Patient recently hospitalized had to she developed a large bullous lesion right index finger very rapidly with red streaks extending up her arm. She history with IV antibiotics and improved. She was seen by hand surgeon, Dr. Grandville Silos who drained bullous lesion. She now has a 2 x 2 cm area without superficial skin right index finger between the MCP and PIP joints. Says is extremely tender. Finger is swollen. Decreased range of motion secondary to swelling. She was placed on doxycycline upon discharge which she will finish next week. In the emergency department, she was noted to be febrile. Was given 1 dose IV clindamycin was started on IV vancomycin. Was admitted July 13. Discharged July 15. Patient had noticed a pink papule on her index finger 2 days prior to admission which rapidly swelled into a bullous lesion. She does minimal yard work but had been working with some Psychologist, counselling. Doesn't recall a specific insect bite. Doesn't recall any puncture wound.    Review of Systems     Objective:   Physical Exam Swelling right index finger with decreased range of motion secondary to swelling. 2 x 2 cm denuded area without evidence of secondary infection. No evidence of surrounding cellulitis of hand or arm       Assessment & Plan:  I suspect this was an insect bite perhaps a brown recluse spider with secondary infection.  Plan: Continue current treatment twice daily. She will clean the area with warm soapy water, dry well and apply Bactroban ointment. Will finish course of doxycycline. Will keep lesion covered. Tetanus immunization is up-to-date. Have asked her to return to see Dr. Grandville Silos for followup.

## 2014-07-06 NOTE — Patient Instructions (Addendum)
Continue to clean wound with warm soapy water twice daily , and apply Bactroban ointment and keep it covered until healed. Followup with Dr. Grandville Silos. Finish course of doxycycline. Recommend range of motion exercises.

## 2014-07-09 ENCOUNTER — Encounter (HOSPITAL_COMMUNITY)
Admission: RE | Admit: 2014-07-09 | Discharge: 2014-07-09 | Disposition: A | Discharge status: Home / Self Care | Payer: Self-pay | Source: Ambulatory Visit | Attending: Cardiovascular Disease | Admitting: Cardiovascular Disease

## 2014-07-09 LAB — CULTURE, BLOOD (ROUTINE X 2)
CULTURE: NO GROWTH
Culture: NO GROWTH

## 2014-07-11 ENCOUNTER — Encounter (HOSPITAL_COMMUNITY)
Admission: RE | Admit: 2014-07-11 | Discharge: 2014-07-11 | Disposition: A | Discharge status: Home / Self Care | Payer: Self-pay | Source: Ambulatory Visit | Attending: Cardiovascular Disease | Admitting: Cardiovascular Disease

## 2014-07-12 DIAGNOSIS — S61209A Unspecified open wound of unspecified finger without damage to nail, initial encounter: Secondary | ICD-10-CM | POA: Diagnosis not present

## 2014-07-13 ENCOUNTER — Encounter (HOSPITAL_COMMUNITY)
Admission: RE | Admit: 2014-07-13 | Discharge: 2014-07-13 | Disposition: A | Discharge status: Home / Self Care | Payer: Self-pay | Source: Ambulatory Visit | Attending: Cardiovascular Disease | Admitting: Cardiovascular Disease

## 2014-07-16 ENCOUNTER — Encounter (HOSPITAL_COMMUNITY)
Admission: RE | Admit: 2014-07-16 | Discharge: 2014-07-16 | Disposition: A | Discharge status: Home / Self Care | Payer: Self-pay | Source: Ambulatory Visit | Attending: Cardiovascular Disease | Admitting: Cardiovascular Disease

## 2014-07-18 ENCOUNTER — Encounter (HOSPITAL_COMMUNITY)
Admission: RE | Admit: 2014-07-18 | Discharge: 2014-07-18 | Disposition: A | Discharge status: Home / Self Care | Payer: Self-pay | Source: Ambulatory Visit | Attending: Cardiovascular Disease | Admitting: Cardiovascular Disease

## 2014-07-20 ENCOUNTER — Encounter (HOSPITAL_COMMUNITY)
Admission: RE | Admit: 2014-07-20 | Discharge: 2014-07-20 | Disposition: A | Discharge status: Home / Self Care | Payer: Self-pay | Source: Ambulatory Visit | Attending: Cardiovascular Disease | Admitting: Cardiovascular Disease

## 2014-07-23 ENCOUNTER — Encounter (HOSPITAL_COMMUNITY)
Admission: RE | Admit: 2014-07-23 | Discharge: 2014-07-23 | Disposition: A | Discharge status: Home / Self Care | Payer: Self-pay | Source: Ambulatory Visit | Attending: Cardiovascular Disease | Admitting: Cardiovascular Disease

## 2014-07-23 DIAGNOSIS — I214 Non-ST elevation (NSTEMI) myocardial infarction: Secondary | ICD-10-CM | POA: Insufficient documentation

## 2014-07-23 DIAGNOSIS — I10 Essential (primary) hypertension: Secondary | ICD-10-CM | POA: Insufficient documentation

## 2014-07-23 DIAGNOSIS — I498 Other specified cardiac arrhythmias: Secondary | ICD-10-CM | POA: Insufficient documentation

## 2014-07-23 DIAGNOSIS — Z5189 Encounter for other specified aftercare: Secondary | ICD-10-CM | POA: Insufficient documentation

## 2014-07-23 DIAGNOSIS — Z9861 Coronary angioplasty status: Secondary | ICD-10-CM | POA: Insufficient documentation

## 2014-07-23 DIAGNOSIS — I2510 Atherosclerotic heart disease of native coronary artery without angina pectoris: Secondary | ICD-10-CM | POA: Insufficient documentation

## 2014-07-23 DIAGNOSIS — R9431 Abnormal electrocardiogram [ECG] [EKG]: Secondary | ICD-10-CM | POA: Insufficient documentation

## 2014-07-23 DIAGNOSIS — E785 Hyperlipidemia, unspecified: Secondary | ICD-10-CM | POA: Insufficient documentation

## 2014-07-25 ENCOUNTER — Encounter (HOSPITAL_COMMUNITY): Payer: Self-pay

## 2014-07-27 ENCOUNTER — Encounter (HOSPITAL_COMMUNITY)
Admission: RE | Admit: 2014-07-27 | Discharge: 2014-07-27 | Disposition: A | Discharge status: Home / Self Care | Payer: Self-pay | Source: Ambulatory Visit | Attending: Cardiovascular Disease | Admitting: Cardiovascular Disease

## 2014-07-30 ENCOUNTER — Encounter (HOSPITAL_COMMUNITY)
Admission: RE | Admit: 2014-07-30 | Discharge: 2014-07-30 | Disposition: A | Discharge status: Home / Self Care | Payer: Self-pay | Source: Ambulatory Visit | Attending: Cardiovascular Disease | Admitting: Cardiovascular Disease

## 2014-08-01 ENCOUNTER — Encounter (HOSPITAL_COMMUNITY)
Admission: RE | Admit: 2014-08-01 | Discharge: 2014-08-01 | Disposition: A | Discharge status: Home / Self Care | Payer: Self-pay | Source: Ambulatory Visit | Attending: Cardiovascular Disease | Admitting: Cardiovascular Disease

## 2014-08-03 ENCOUNTER — Encounter (HOSPITAL_COMMUNITY)
Admission: RE | Admit: 2014-08-03 | Discharge: 2014-08-03 | Disposition: A | Discharge status: Home / Self Care | Payer: Self-pay | Source: Ambulatory Visit | Attending: Cardiovascular Disease | Admitting: Cardiovascular Disease

## 2014-08-06 ENCOUNTER — Encounter (HOSPITAL_COMMUNITY)
Admission: RE | Admit: 2014-08-06 | Discharge: 2014-08-06 | Disposition: A | Discharge status: Home / Self Care | Payer: Self-pay | Source: Ambulatory Visit | Attending: Cardiovascular Disease | Admitting: Cardiovascular Disease

## 2014-08-08 ENCOUNTER — Encounter (HOSPITAL_COMMUNITY)
Admission: RE | Admit: 2014-08-08 | Discharge: 2014-08-08 | Disposition: A | Discharge status: Home / Self Care | Payer: Self-pay | Source: Ambulatory Visit | Attending: Cardiovascular Disease | Admitting: Cardiovascular Disease

## 2014-08-10 ENCOUNTER — Encounter (HOSPITAL_COMMUNITY): Payer: Self-pay

## 2014-08-13 ENCOUNTER — Encounter (HOSPITAL_COMMUNITY)
Admission: RE | Admit: 2014-08-13 | Discharge: 2014-08-13 | Disposition: A | Discharge status: Home / Self Care | Payer: Self-pay | Source: Ambulatory Visit | Attending: Cardiovascular Disease | Admitting: Cardiovascular Disease

## 2014-08-15 ENCOUNTER — Encounter (HOSPITAL_COMMUNITY)
Admission: RE | Admit: 2014-08-15 | Discharge: 2014-08-15 | Disposition: A | Discharge status: Home / Self Care | Payer: Self-pay | Source: Ambulatory Visit | Attending: Cardiovascular Disease | Admitting: Cardiovascular Disease

## 2014-08-17 ENCOUNTER — Encounter (HOSPITAL_COMMUNITY): Payer: Self-pay

## 2014-08-20 ENCOUNTER — Encounter (HOSPITAL_COMMUNITY)
Admission: RE | Admit: 2014-08-20 | Discharge: 2014-08-20 | Disposition: A | Discharge status: Home / Self Care | Payer: Self-pay | Source: Ambulatory Visit | Attending: Cardiovascular Disease | Admitting: Cardiovascular Disease

## 2014-08-22 ENCOUNTER — Encounter (HOSPITAL_COMMUNITY)
Admission: RE | Admit: 2014-08-22 | Discharge: 2014-08-22 | Disposition: A | Discharge status: Home / Self Care | Payer: Self-pay | Source: Ambulatory Visit | Attending: Cardiovascular Disease | Admitting: Cardiovascular Disease

## 2014-08-22 DIAGNOSIS — I2510 Atherosclerotic heart disease of native coronary artery without angina pectoris: Secondary | ICD-10-CM | POA: Insufficient documentation

## 2014-08-22 DIAGNOSIS — E785 Hyperlipidemia, unspecified: Secondary | ICD-10-CM | POA: Insufficient documentation

## 2014-08-22 DIAGNOSIS — I498 Other specified cardiac arrhythmias: Secondary | ICD-10-CM | POA: Insufficient documentation

## 2014-08-22 DIAGNOSIS — R9431 Abnormal electrocardiogram [ECG] [EKG]: Secondary | ICD-10-CM | POA: Insufficient documentation

## 2014-08-22 DIAGNOSIS — Z5189 Encounter for other specified aftercare: Secondary | ICD-10-CM | POA: Insufficient documentation

## 2014-08-22 DIAGNOSIS — I214 Non-ST elevation (NSTEMI) myocardial infarction: Secondary | ICD-10-CM | POA: Insufficient documentation

## 2014-08-22 DIAGNOSIS — Z9861 Coronary angioplasty status: Secondary | ICD-10-CM | POA: Insufficient documentation

## 2014-08-22 DIAGNOSIS — I10 Essential (primary) hypertension: Secondary | ICD-10-CM | POA: Insufficient documentation

## 2014-08-24 ENCOUNTER — Encounter (HOSPITAL_COMMUNITY)
Admission: RE | Admit: 2014-08-24 | Discharge: 2014-08-24 | Disposition: A | Discharge status: Home / Self Care | Payer: Self-pay | Source: Ambulatory Visit | Attending: Cardiovascular Disease | Admitting: Cardiovascular Disease

## 2014-08-28 ENCOUNTER — Telehealth: Payer: Self-pay

## 2014-08-28 MED ORDER — HYDROCODONE-ACETAMINOPHEN 10-325 MG PO TABS: 1.0000 | ORAL_TABLET | Freq: Three times a day (TID) | ORAL | Status: DC | PRN

## 2014-08-28 NOTE — Telephone Encounter (Signed)
Patient called requesting a refill on hydrocodone 10/325.  She is taking this 5-6 times a week at night.  Please advise.

## 2014-08-28 NOTE — Telephone Encounter (Signed)
Hydrocodone rx printed.  Patient aware and will come by to pick up.

## 2014-08-28 NOTE — Telephone Encounter (Signed)
Please prescribe #90 with no refill for musculoskeletal pain with no refill

## 2014-08-29 ENCOUNTER — Encounter (HOSPITAL_COMMUNITY)
Admission: RE | Admit: 2014-08-29 | Discharge: 2014-08-29 | Disposition: A | Discharge status: Home / Self Care | Payer: Self-pay | Source: Ambulatory Visit | Attending: Cardiovascular Disease | Admitting: Cardiovascular Disease

## 2014-08-31 ENCOUNTER — Encounter (HOSPITAL_COMMUNITY)
Admission: RE | Admit: 2014-08-31 | Discharge: 2014-08-31 | Disposition: A | Discharge status: Home / Self Care | Payer: Self-pay | Source: Ambulatory Visit | Attending: Cardiovascular Disease | Admitting: Cardiovascular Disease

## 2014-09-01 ENCOUNTER — Other Ambulatory Visit: Payer: Self-pay | Admitting: Internal Medicine

## 2014-09-03 ENCOUNTER — Encounter (HOSPITAL_COMMUNITY)
Admission: RE | Admit: 2014-09-03 | Discharge: 2014-09-03 | Disposition: A | Discharge status: Home / Self Care | Payer: Self-pay | Source: Ambulatory Visit | Attending: Cardiovascular Disease | Admitting: Cardiovascular Disease

## 2014-09-05 ENCOUNTER — Encounter (HOSPITAL_COMMUNITY)
Admission: RE | Admit: 2014-09-05 | Discharge: 2014-09-05 | Disposition: A | Discharge status: Home / Self Care | Payer: Self-pay | Source: Ambulatory Visit | Attending: Cardiovascular Disease | Admitting: Cardiovascular Disease

## 2014-09-07 ENCOUNTER — Encounter (HOSPITAL_COMMUNITY)
Admission: RE | Admit: 2014-09-07 | Discharge: 2014-09-07 | Disposition: A | Discharge status: Home / Self Care | Payer: Self-pay | Source: Ambulatory Visit | Attending: Cardiovascular Disease | Admitting: Cardiovascular Disease

## 2014-09-08 ENCOUNTER — Other Ambulatory Visit: Payer: Self-pay | Admitting: Cardiovascular Disease

## 2014-09-10 ENCOUNTER — Encounter (HOSPITAL_COMMUNITY)
Admission: RE | Admit: 2014-09-10 | Discharge: 2014-09-10 | Disposition: A | Discharge status: Home / Self Care | Payer: Self-pay | Source: Ambulatory Visit | Attending: Cardiovascular Disease | Admitting: Cardiovascular Disease

## 2014-09-12 ENCOUNTER — Encounter (HOSPITAL_COMMUNITY): Payer: Self-pay

## 2014-09-14 ENCOUNTER — Encounter (HOSPITAL_COMMUNITY)
Admission: RE | Admit: 2014-09-14 | Discharge: 2014-09-14 | Disposition: A | Discharge status: Home / Self Care | Payer: Self-pay | Source: Ambulatory Visit | Attending: Cardiovascular Disease | Admitting: Cardiovascular Disease

## 2014-09-17 ENCOUNTER — Encounter (HOSPITAL_COMMUNITY)
Admission: RE | Admit: 2014-09-17 | Discharge: 2014-09-17 | Disposition: A | Discharge status: Home / Self Care | Payer: Self-pay | Source: Ambulatory Visit | Attending: Cardiovascular Disease | Admitting: Cardiovascular Disease

## 2014-09-19 ENCOUNTER — Encounter (HOSPITAL_COMMUNITY): Payer: Self-pay

## 2014-09-21 ENCOUNTER — Encounter (HOSPITAL_COMMUNITY)
Admission: RE | Admit: 2014-09-21 | Discharge: 2014-09-21 | Disposition: A | Discharge status: Home / Self Care | Payer: Self-pay | Source: Ambulatory Visit | Attending: Cardiovascular Disease | Admitting: Cardiovascular Disease

## 2014-09-21 DIAGNOSIS — Z48812 Encounter for surgical aftercare following surgery on the circulatory system: Secondary | ICD-10-CM | POA: Insufficient documentation

## 2014-09-21 DIAGNOSIS — Z9861 Coronary angioplasty status: Secondary | ICD-10-CM | POA: Insufficient documentation

## 2014-09-21 DIAGNOSIS — G4733 Obstructive sleep apnea (adult) (pediatric): Secondary | ICD-10-CM | POA: Insufficient documentation

## 2014-09-21 DIAGNOSIS — I252 Old myocardial infarction: Secondary | ICD-10-CM | POA: Insufficient documentation

## 2014-09-21 DIAGNOSIS — I10 Essential (primary) hypertension: Secondary | ICD-10-CM | POA: Insufficient documentation

## 2014-09-21 DIAGNOSIS — I2510 Atherosclerotic heart disease of native coronary artery without angina pectoris: Secondary | ICD-10-CM | POA: Insufficient documentation

## 2014-09-21 DIAGNOSIS — E785 Hyperlipidemia, unspecified: Secondary | ICD-10-CM | POA: Insufficient documentation

## 2014-09-21 DIAGNOSIS — Z955 Presence of coronary angioplasty implant and graft: Secondary | ICD-10-CM | POA: Insufficient documentation

## 2014-09-24 ENCOUNTER — Encounter (HOSPITAL_COMMUNITY)
Admission: RE | Admit: 2014-09-24 | Discharge: 2014-09-24 | Disposition: A | Discharge status: Home / Self Care | Payer: Self-pay | Source: Ambulatory Visit | Attending: Cardiovascular Disease | Admitting: Cardiovascular Disease

## 2014-09-26 ENCOUNTER — Encounter (HOSPITAL_COMMUNITY)
Admission: RE | Admit: 2014-09-26 | Discharge: 2014-09-26 | Disposition: A | Discharge status: Home / Self Care | Payer: Self-pay | Source: Ambulatory Visit | Attending: Cardiovascular Disease | Admitting: Cardiovascular Disease

## 2014-09-27 ENCOUNTER — Ambulatory Visit (INDEPENDENT_AMBULATORY_CARE_PROVIDER_SITE_OTHER): Payer: Medicare Other | Admitting: Internal Medicine

## 2014-09-27 ENCOUNTER — Encounter: Payer: Self-pay | Admitting: Internal Medicine

## 2014-09-27 VITALS — BP 144/80 | HR 66 | Temp 97.8°F | Ht 61.0 in | Wt 166.0 lb

## 2014-09-27 DIAGNOSIS — L0293 Carbuncle, unspecified: Secondary | ICD-10-CM | POA: Diagnosis not present

## 2014-09-27 DIAGNOSIS — I2510 Atherosclerotic heart disease of native coronary artery without angina pectoris: Secondary | ICD-10-CM

## 2014-09-27 MED ORDER — KETOCONAZOLE 2 % EX CREA: 1.0000 "application " | TOPICAL_CREAM | Freq: Every day | CUTANEOUS | Status: DC

## 2014-09-27 MED ORDER — MUPIROCIN 2 % EX OINT: TOPICAL_OINTMENT | CUTANEOUS | Status: DC

## 2014-09-27 MED ORDER — DOXYCYCLINE HYCLATE 100 MG PO TABS: 100.0000 mg | ORAL_TABLET | Freq: Two times a day (BID) | ORAL | Status: DC

## 2014-09-27 NOTE — Progress Notes (Signed)
   Subjective:    Patient ID: Carrie Chung, female    DOB: Jun 19, 1941, 73 y.o.   MRN: [id number]  HPI  Patient has developed erythematous lesion left breast at approximately 11:00. Also has multiple lesions on left abdomen and trunk that are erythematous and some appear to be pustular. She also has area of erythema under right breast.    Review of Systems     Objective:   Physical Exam  Area of intertrigo under right breast. Area of erythema left breast at 11:00. Multiple lesions on left abdomen and trunk some of which are excoriated with surrounding erythema. Some are tiny with pustules.      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 Wants to go to [place] to visit relatives but may need to defer trip.  Plan: Doxycycline 100 mg twice daily for 3 weeks. Apply Bactroban ointment to skin lesions on trunk. Also apply Bactroban ointment to nostrils daily. We did a wound culture on one of the pustular areas which is pending. She has been bathing with Hibiclens. She will continue to do that. She will apply it from neck down for at least 10 minutes before rinsing off. Probably should use washcloth to scrub body with Hibiclens  25 minutes spent with patient

## 2014-09-27 NOTE — Patient Instructions (Signed)
Bathe with  Hibiclens daily. Allow Hibiclens to stay on skin for 10 minutes before rinsing off. Use washcloth to bathe. Apply Bactroban ointment to skin lesions and apply to nostrils nightly. Doxycycline 100 mg twice daily for 3 weeks.

## 2014-09-28 ENCOUNTER — Encounter (HOSPITAL_COMMUNITY): Payer: Self-pay

## 2014-09-30 LAB — WOUND CULTURE
GRAM STAIN: NONE SEEN
Gram Stain: NONE SEEN
Gram Stain: NONE SEEN

## 2014-10-01 ENCOUNTER — Telehealth: Payer: Self-pay

## 2014-10-01 ENCOUNTER — Encounter (HOSPITAL_COMMUNITY): Payer: Self-pay

## 2014-10-01 NOTE — Telephone Encounter (Signed)
Message copied by Amado Coe on Mon Oct 01, 2014  9:28 AM ------      Message from: [name]      Created: Sun Sep 30, 2014  2:40 PM       Please call pt. Culture showed MRSA. Doxycycline is the correct antibiotic. ------

## 2014-10-01 NOTE — Telephone Encounter (Signed)
Patient aware of culture results 

## 2014-10-02 ENCOUNTER — Telehealth (HOSPITAL_COMMUNITY): Payer: Self-pay | Admitting: *Deleted

## 2014-10-02 ENCOUNTER — Telehealth: Payer: Self-pay | Admitting: Internal Medicine

## 2014-10-02 NOTE — Telephone Encounter (Signed)
I will call patient

## 2014-10-02 NOTE — Telephone Encounter (Signed)
Daughter calls stating she's concerned about her Mom's mental state of mind at the present.  States she's very depressed and doesn't want to admit that.  I see in the note from her visit last week, depression is noted, but it doesn't appear any changes were made, medications added or changed and patient is not currently scheduled for a f/u appointment.    Daughter wanted me to make Dr Renold Genta aware of the great concern of the depression and wanted to ask if you would consider seeing her in f/u specific to the depression.  I advised the daughter that I could leave a note for the doctor, but I was unable to provide any medical information regarding her visit last week.    Please advise.

## 2014-10-03 ENCOUNTER — Encounter (HOSPITAL_COMMUNITY): Payer: Self-pay

## 2014-10-05 ENCOUNTER — Other Ambulatory Visit: Payer: Self-pay

## 2014-10-05 ENCOUNTER — Encounter (HOSPITAL_COMMUNITY): Payer: Self-pay

## 2014-10-08 ENCOUNTER — Encounter (HOSPITAL_COMMUNITY): Payer: Self-pay

## 2014-10-10 ENCOUNTER — Encounter (HOSPITAL_COMMUNITY): Payer: Self-pay

## 2014-10-12 ENCOUNTER — Encounter (HOSPITAL_COMMUNITY): Payer: Self-pay

## 2014-10-15 ENCOUNTER — Encounter (HOSPITAL_COMMUNITY): Payer: Self-pay

## 2014-10-15 ENCOUNTER — Telehealth: Payer: Self-pay | Admitting: Internal Medicine

## 2014-10-15 NOTE — Telephone Encounter (Signed)
Appt informed about appointment to discuss depression and MRSA.

## 2014-10-15 NOTE — Telephone Encounter (Signed)
Patient states she has finished the Doxycycline.  States the lesions are dark, flat/scarring at this point.  States she has no fever.  She may have a couple of lesions that are still a bit red and raised, but the majority are flat.    Her question:  When can she return to Cardiac Rehab?  She hasn't been going because she couldn't emotionally handle having to tell everyone that she had MRSA and that they would have to wipe down everything and no contact with no one.  Says she has been somewhat depressed over this.  She did cancel her trip to [place].  She and her husband are going to go over Thanksgiving.   She is ready to return to Cardiac Rehab if it is clinically ok for her to be around other people at this point.  She doesn't want to put anyone at risk, but states mentally and physically, she NEEDS to return to Cardiac Rehab.  Please advise.

## 2014-10-15 NOTE — Telephone Encounter (Signed)
Please make an appt later this week to discuss MRSA and depression

## 2014-10-17 ENCOUNTER — Encounter (HOSPITAL_COMMUNITY): Payer: Self-pay

## 2014-10-18 ENCOUNTER — Ambulatory Visit (INDEPENDENT_AMBULATORY_CARE_PROVIDER_SITE_OTHER): Payer: Medicare Other | Admitting: Internal Medicine

## 2014-10-18 ENCOUNTER — Other Ambulatory Visit: Payer: Self-pay | Admitting: Cardiovascular Disease

## 2014-10-18 ENCOUNTER — Encounter: Payer: Self-pay | Admitting: Internal Medicine

## 2014-10-18 VITALS — BP 122/78 | HR 60 | Temp 98.6°F | Wt 160.0 lb

## 2014-10-18 DIAGNOSIS — I2510 Atherosclerotic heart disease of native coronary artery without angina pectoris: Secondary | ICD-10-CM

## 2014-10-18 DIAGNOSIS — A4902 Methicillin resistant Staphylococcus aureus infection, unspecified site: Secondary | ICD-10-CM

## 2014-10-18 DIAGNOSIS — F329 Major depressive disorder, single episode, unspecified: Secondary | ICD-10-CM | POA: Diagnosis not present

## 2014-10-18 DIAGNOSIS — F32A Depression, unspecified: Secondary | ICD-10-CM

## 2014-10-18 NOTE — Patient Instructions (Signed)
Add the Wellbutrin and return in 4 weeks. OK to cardiac rehab.

## 2014-10-18 NOTE — Progress Notes (Signed)
   Subjective:    Patient ID: Carrie Chung, female    DOB: 12/23/1940, 73 y.o.   MRN: [id number]  HPI  She is on Lexapro 20 mg daily and is been on this status post MI. Ago. Now feels like she is down once again. Doesn't really feel like doing much of anything. Hasn't put on makeup in a couple of weeks. Had been looking forward to trip to [place] but had to cancel that  When she developed MRSA infection, was treated with doxycycline. Lesions have now healed. She was afraid to go to cardiac rehabilitation and was embarrassed about having MRSA infection. They were going to have to wipe  equipment down after she used it. I feel that now these lesions have healed she can return to cardiac rehabilitation at any time now. Note provided for her to return.  Review of Systems     Objective:   Physical Exam  Spent 25 minutes speaking with her about her concerns. Issue seems to be lack of motivation and energy.      Assessment & Plan:  Depression  Recent MRSA infection of trunk  Plan: Add Wellbutrin 150 mg XL daily to Lexapro. Return in 4 weeks.  25 minutes spent with patient

## 2014-10-19 ENCOUNTER — Encounter (HOSPITAL_COMMUNITY): Payer: Self-pay

## 2014-10-19 ENCOUNTER — Telehealth: Payer: Self-pay | Admitting: Internal Medicine

## 2014-10-19 MED ORDER — BUPROPION HCL ER (XL) 150 MG PO TB24: 150.0000 mg | ORAL_TABLET | Freq: Every day | ORAL | Status: DC

## 2014-10-19 NOTE — Telephone Encounter (Signed)
Prescription for Wellbutrin 150 mg never rec'd at pharmacy - can you call this in for patient.  Uses [facility] [address].

## 2014-10-19 NOTE — Telephone Encounter (Signed)
Wellbutrin rx sent to pharmacy.

## 2014-10-22 ENCOUNTER — Encounter (HOSPITAL_COMMUNITY)
Admission: RE | Admit: 2014-10-22 | Discharge: 2014-10-22 | Disposition: A | Discharge status: Home / Self Care | Payer: Self-pay | Source: Ambulatory Visit | Attending: Cardiovascular Disease | Admitting: Cardiovascular Disease

## 2014-10-22 DIAGNOSIS — Z9861 Coronary angioplasty status: Secondary | ICD-10-CM | POA: Insufficient documentation

## 2014-10-22 DIAGNOSIS — I499 Cardiac arrhythmia, unspecified: Secondary | ICD-10-CM | POA: Insufficient documentation

## 2014-10-22 DIAGNOSIS — E785 Hyperlipidemia, unspecified: Secondary | ICD-10-CM | POA: Insufficient documentation

## 2014-10-22 DIAGNOSIS — Z5189 Encounter for other specified aftercare: Secondary | ICD-10-CM | POA: Insufficient documentation

## 2014-10-22 DIAGNOSIS — I10 Essential (primary) hypertension: Secondary | ICD-10-CM | POA: Insufficient documentation

## 2014-10-22 DIAGNOSIS — I2510 Atherosclerotic heart disease of native coronary artery without angina pectoris: Secondary | ICD-10-CM | POA: Insufficient documentation

## 2014-10-22 DIAGNOSIS — I252 Old myocardial infarction: Secondary | ICD-10-CM | POA: Insufficient documentation

## 2014-10-24 ENCOUNTER — Encounter (HOSPITAL_COMMUNITY)
Admission: RE | Admit: 2014-10-24 | Discharge: 2014-10-24 | Disposition: A | Discharge status: Home / Self Care | Payer: Self-pay | Source: Ambulatory Visit | Attending: Cardiovascular Disease | Admitting: Cardiovascular Disease

## 2014-10-26 ENCOUNTER — Encounter (HOSPITAL_COMMUNITY)
Admission: RE | Admit: 2014-10-26 | Discharge: 2014-10-26 | Disposition: A | Discharge status: Home / Self Care | Payer: Self-pay | Source: Ambulatory Visit | Attending: Cardiovascular Disease | Admitting: Cardiovascular Disease

## 2014-10-29 ENCOUNTER — Encounter (HOSPITAL_COMMUNITY)
Admission: RE | Admit: 2014-10-29 | Discharge: 2014-10-29 | Disposition: A | Discharge status: Home / Self Care | Payer: Self-pay | Source: Ambulatory Visit | Attending: Cardiovascular Disease | Admitting: Cardiovascular Disease

## 2014-10-31 ENCOUNTER — Encounter (HOSPITAL_COMMUNITY)
Admission: RE | Admit: 2014-10-31 | Discharge: 2014-10-31 | Disposition: A | Discharge status: Home / Self Care | Payer: Self-pay | Source: Ambulatory Visit | Attending: Cardiovascular Disease | Admitting: Cardiovascular Disease

## 2014-11-02 ENCOUNTER — Encounter (HOSPITAL_COMMUNITY): Payer: Self-pay

## 2014-11-05 ENCOUNTER — Encounter (HOSPITAL_COMMUNITY)
Admission: RE | Admit: 2014-11-05 | Discharge: 2014-11-05 | Disposition: A | Discharge status: Home / Self Care | Payer: Self-pay | Source: Ambulatory Visit | Attending: Cardiovascular Disease | Admitting: Cardiovascular Disease

## 2014-11-07 ENCOUNTER — Encounter (HOSPITAL_COMMUNITY)
Admission: RE | Admit: 2014-11-07 | Discharge: 2014-11-07 | Disposition: A | Discharge status: Home / Self Care | Payer: Self-pay | Source: Ambulatory Visit | Attending: Cardiovascular Disease | Admitting: Cardiovascular Disease

## 2014-11-09 ENCOUNTER — Encounter (HOSPITAL_COMMUNITY): Payer: Self-pay

## 2014-11-12 ENCOUNTER — Other Ambulatory Visit: Payer: Self-pay

## 2014-11-12 ENCOUNTER — Encounter (HOSPITAL_COMMUNITY)
Admission: RE | Admit: 2014-11-12 | Discharge: 2014-11-12 | Disposition: A | Discharge status: Home / Self Care | Payer: Self-pay | Source: Ambulatory Visit | Attending: Cardiovascular Disease | Admitting: Cardiovascular Disease

## 2014-11-12 MED ORDER — TEMAZEPAM 30 MG PO CAPS: 30.0000 mg | ORAL_CAPSULE | Freq: Every day | ORAL | Status: DC

## 2014-11-14 ENCOUNTER — Encounter (HOSPITAL_COMMUNITY): Payer: Self-pay

## 2014-11-19 ENCOUNTER — Encounter (HOSPITAL_COMMUNITY): Payer: Self-pay

## 2014-11-21 ENCOUNTER — Encounter (HOSPITAL_COMMUNITY): Payer: Medicare Other

## 2014-11-21 DIAGNOSIS — I2510 Atherosclerotic heart disease of native coronary artery without angina pectoris: Secondary | ICD-10-CM | POA: Insufficient documentation

## 2014-11-21 DIAGNOSIS — I499 Cardiac arrhythmia, unspecified: Secondary | ICD-10-CM | POA: Insufficient documentation

## 2014-11-21 DIAGNOSIS — Z9861 Coronary angioplasty status: Secondary | ICD-10-CM | POA: Insufficient documentation

## 2014-11-21 DIAGNOSIS — Z5189 Encounter for other specified aftercare: Secondary | ICD-10-CM | POA: Insufficient documentation

## 2014-11-21 DIAGNOSIS — E785 Hyperlipidemia, unspecified: Secondary | ICD-10-CM | POA: Insufficient documentation

## 2014-11-21 DIAGNOSIS — I10 Essential (primary) hypertension: Secondary | ICD-10-CM | POA: Insufficient documentation

## 2014-11-21 DIAGNOSIS — I252 Old myocardial infarction: Secondary | ICD-10-CM | POA: Insufficient documentation

## 2014-11-22 ENCOUNTER — Encounter: Payer: Self-pay | Admitting: Internal Medicine

## 2014-11-22 ENCOUNTER — Ambulatory Visit (INDEPENDENT_AMBULATORY_CARE_PROVIDER_SITE_OTHER): Payer: Medicare Other | Admitting: Internal Medicine

## 2014-11-22 VITALS — BP 128/82 | HR 65 | Temp 97.8°F | Ht 61.0 in

## 2014-11-22 DIAGNOSIS — G4700 Insomnia, unspecified: Secondary | ICD-10-CM | POA: Diagnosis not present

## 2014-11-22 DIAGNOSIS — R5383 Other fatigue: Secondary | ICD-10-CM | POA: Diagnosis not present

## 2014-11-22 DIAGNOSIS — F329 Major depressive disorder, single episode, unspecified: Secondary | ICD-10-CM | POA: Diagnosis not present

## 2014-11-22 DIAGNOSIS — F32A Depression, unspecified: Secondary | ICD-10-CM

## 2014-11-22 DIAGNOSIS — I2510 Atherosclerotic heart disease of native coronary artery without angina pectoris: Secondary | ICD-10-CM | POA: Diagnosis not present

## 2014-11-22 MED ORDER — FLURAZEPAM HCL 30 MG PO CAPS: 30.0000 mg | ORAL_CAPSULE | Freq: Every evening | ORAL | Status: DC | PRN

## 2014-11-22 MED ORDER — HYDROCODONE-ACETAMINOPHEN 10-325 MG PO TABS: 1.0000 | ORAL_TABLET | Freq: Three times a day (TID) | ORAL | Status: DC | PRN

## 2014-11-23 ENCOUNTER — Encounter (HOSPITAL_COMMUNITY): Payer: Self-pay

## 2014-11-26 ENCOUNTER — Telehealth: Payer: Self-pay

## 2014-11-26 ENCOUNTER — Encounter (HOSPITAL_COMMUNITY)
Admission: RE | Admit: 2014-11-26 | Discharge: 2014-11-26 | Disposition: A | Discharge status: Home / Self Care | Payer: Self-pay | Source: Ambulatory Visit | Attending: Cardiovascular Disease | Admitting: Cardiovascular Disease

## 2014-11-26 NOTE — Telephone Encounter (Signed)
Patient says she received flu vaccine in Dr Renold Genta office.  Date unknown.

## 2014-11-26 NOTE — Telephone Encounter (Signed)
Left message for patient to call and let us know if they have received flu vaccine.

## 2014-11-28 ENCOUNTER — Encounter (HOSPITAL_COMMUNITY)
Admission: RE | Admit: 2014-11-28 | Discharge: 2014-11-28 | Disposition: A | Discharge status: Home / Self Care | Payer: Self-pay | Source: Ambulatory Visit | Attending: Cardiovascular Disease | Admitting: Cardiovascular Disease

## 2014-11-29 ENCOUNTER — Encounter (HOSPITAL_COMMUNITY): Payer: Self-pay | Admitting: Cardiovascular Disease

## 2014-11-30 ENCOUNTER — Encounter (HOSPITAL_COMMUNITY)
Admission: RE | Admit: 2014-11-30 | Discharge: 2014-11-30 | Disposition: A | Discharge status: Home / Self Care | Payer: Self-pay | Source: Ambulatory Visit | Attending: Cardiovascular Disease | Admitting: Cardiovascular Disease

## 2014-12-03 ENCOUNTER — Encounter (HOSPITAL_COMMUNITY)
Admission: RE | Admit: 2014-12-03 | Discharge: 2014-12-03 | Disposition: A | Discharge status: Home / Self Care | Payer: Self-pay | Source: Ambulatory Visit | Attending: Cardiovascular Disease | Admitting: Cardiovascular Disease

## 2014-12-04 ENCOUNTER — Telehealth: Payer: Self-pay | Admitting: Cardiovascular Disease

## 2014-12-04 NOTE — Telephone Encounter (Signed)
New message       Pt would like to know if she needs labs for her f/u visit in dec 2015?

## 2014-12-04 NOTE — Telephone Encounter (Signed)
Lmtcb.

## 2014-12-05 ENCOUNTER — Encounter (HOSPITAL_COMMUNITY)
Admission: RE | Admit: 2014-12-05 | Discharge: 2014-12-05 | Disposition: A | Discharge status: Home / Self Care | Payer: Self-pay | Source: Ambulatory Visit | Attending: Cardiovascular Disease | Admitting: Cardiovascular Disease

## 2014-12-05 ENCOUNTER — Ambulatory Visit: Payer: Medicare Other | Admitting: Cardiology

## 2014-12-06 NOTE — Telephone Encounter (Signed)
Pt is aware that according to the last office visit Dr. Burt Knack does not mention to have blood work prior office visit. Pt verbalized understanding.

## 2014-12-06 NOTE — Telephone Encounter (Signed)
Follow up      Pt returning a nurses call regarding if she needs lab work prior to her dec appt

## 2014-12-07 ENCOUNTER — Encounter (HOSPITAL_COMMUNITY)
Admission: RE | Admit: 2014-12-07 | Discharge: 2014-12-07 | Disposition: A | Discharge status: Home / Self Care | Payer: Self-pay | Source: Ambulatory Visit | Attending: Cardiovascular Disease | Admitting: Cardiovascular Disease

## 2014-12-10 ENCOUNTER — Encounter (HOSPITAL_COMMUNITY)
Admission: RE | Admit: 2014-12-10 | Discharge: 2014-12-10 | Disposition: A | Discharge status: Home / Self Care | Payer: Self-pay | Source: Ambulatory Visit | Attending: Cardiovascular Disease | Admitting: Cardiovascular Disease

## 2014-12-12 ENCOUNTER — Encounter (HOSPITAL_COMMUNITY): Payer: Self-pay

## 2014-12-14 ENCOUNTER — Emergency Department (HOSPITAL_COMMUNITY): Payer: Medicare Other

## 2014-12-14 ENCOUNTER — Encounter (HOSPITAL_COMMUNITY): Payer: Self-pay | Admitting: *Deleted

## 2014-12-14 ENCOUNTER — Emergency Department (HOSPITAL_COMMUNITY)
Admission: EM | Admit: 2014-12-14 | Discharge: 2014-12-14 | Disposition: A | Discharge status: Home / Self Care | Payer: Medicare Other | Attending: Emergency Medicine | Admitting: Emergency Medicine

## 2014-12-14 DIAGNOSIS — R040 Epistaxis: Secondary | ICD-10-CM | POA: Diagnosis not present

## 2014-12-14 DIAGNOSIS — Z8639 Personal history of other endocrine, nutritional and metabolic disease: Secondary | ICD-10-CM | POA: Diagnosis not present

## 2014-12-14 DIAGNOSIS — Z7902 Long term (current) use of antithrombotics/antiplatelets: Secondary | ICD-10-CM | POA: Diagnosis not present

## 2014-12-14 DIAGNOSIS — Y998 Other external cause status: Secondary | ICD-10-CM | POA: Insufficient documentation

## 2014-12-14 DIAGNOSIS — Z79899 Other long term (current) drug therapy: Secondary | ICD-10-CM | POA: Insufficient documentation

## 2014-12-14 DIAGNOSIS — I2510 Atherosclerotic heart disease of native coronary artery without angina pectoris: Secondary | ICD-10-CM | POA: Insufficient documentation

## 2014-12-14 DIAGNOSIS — Y92009 Unspecified place in unspecified non-institutional (private) residence as the place of occurrence of the external cause: Secondary | ICD-10-CM | POA: Diagnosis not present

## 2014-12-14 DIAGNOSIS — R000 Tachycardia, unspecified: Secondary | ICD-10-CM | POA: Diagnosis not present

## 2014-12-14 DIAGNOSIS — I252 Old myocardial infarction: Secondary | ICD-10-CM | POA: Diagnosis not present

## 2014-12-14 DIAGNOSIS — I10 Essential (primary) hypertension: Secondary | ICD-10-CM | POA: Diagnosis not present

## 2014-12-14 DIAGNOSIS — S022XXA Fracture of nasal bones, initial encounter for closed fracture: Secondary | ICD-10-CM | POA: Diagnosis not present

## 2014-12-14 DIAGNOSIS — Z7982 Long term (current) use of aspirin: Secondary | ICD-10-CM | POA: Insufficient documentation

## 2014-12-14 DIAGNOSIS — Y9389 Activity, other specified: Secondary | ICD-10-CM | POA: Diagnosis not present

## 2014-12-14 DIAGNOSIS — S0990XA Unspecified injury of head, initial encounter: Secondary | ICD-10-CM | POA: Diagnosis not present

## 2014-12-14 DIAGNOSIS — W19XXXA Unspecified fall, initial encounter: Secondary | ICD-10-CM

## 2014-12-14 DIAGNOSIS — Z87891 Personal history of nicotine dependence: Secondary | ICD-10-CM | POA: Diagnosis not present

## 2014-12-14 DIAGNOSIS — Z880 Allergy status to penicillin: Secondary | ICD-10-CM | POA: Insufficient documentation

## 2014-12-14 DIAGNOSIS — W01198A Fall on same level from slipping, tripping and stumbling with subsequent striking against other object, initial encounter: Secondary | ICD-10-CM | POA: Insufficient documentation

## 2014-12-14 LAB — CBC WITH DIFFERENTIAL/PLATELET
BASOS PCT: 0 % (ref 0–1)
Basophils Absolute: 0 10*3/uL (ref 0.0–0.1)
EOS PCT: 2 % (ref 0–5)
Eosinophils Absolute: 0.1 10*3/uL (ref 0.0–0.7)
HEMATOCRIT: 37.1 % (ref 36.0–46.0)
HEMOGLOBIN: 12.6 g/dL (ref 12.0–15.0)
Lymphocytes Relative: 27 % (ref 12–46)
Lymphs Abs: 1.4 10*3/uL (ref 0.7–4.0)
MCH: 31.8 pg (ref 26.0–34.0)
MCHC: 34 g/dL (ref 30.0–36.0)
MCV: 93.7 fL (ref 78.0–100.0)
MONO ABS: 0.3 10*3/uL (ref 0.1–1.0)
MONOS PCT: 6 % (ref 3–12)
NEUTROS ABS: 3.2 10*3/uL (ref 1.7–7.7)
Neutrophils Relative %: 65 % (ref 43–77)
Platelets: 127 10*3/uL — ABNORMAL LOW (ref 150–400)
RBC: 3.96 MIL/uL (ref 3.87–5.11)
RDW: 12.9 % (ref 11.5–15.5)
WBC: 5 10*3/uL (ref 4.0–10.5)

## 2014-12-14 LAB — BASIC METABOLIC PANEL
Anion gap: 10 (ref 5–15)
BUN: 13 mg/dL (ref 6–23)
CALCIUM: 8.9 mg/dL (ref 8.4–10.5)
CO2: 29 mmol/L (ref 19–32)
CREATININE: 0.82 mg/dL (ref 0.50–1.10)
Chloride: 99 mEq/L (ref 96–112)
GFR calc Af Amer: 80 mL/min — ABNORMAL LOW (ref >90)
GFR calc non Af Amer: 69 mL/min — ABNORMAL LOW (ref >90)
Glucose, Bld: 98 mg/dL (ref 70–99)
Potassium: 3.6 mmol/L (ref 3.5–5.1)
Sodium: 138 mmol/L (ref 135–145)

## 2014-12-14 NOTE — ED Notes (Signed)
Pt in from home c/o epistaxis onset post fall x 3 days ago, pt reports intermittent bleeding since then with worsening today @ 5:30 without stopping, pt takes Plavix, pt A&O x4, follows commands, speaks in complete sentences, denies hitting head, -LOC

## 2014-12-14 NOTE — ED Provider Notes (Signed)
CSN: [id number]     Arrival date & time 12/14/14  8185 History   First MD Initiated Contact with Patient 12/14/14 [id number]     Chief Complaint  Patient presents with  . Epistaxis     (Consider location/radiation/quality/duration/timing/severity/associated sxs/prior Treatment) Patient is a 73 y.o. female presenting with nosebleeds. The history is provided by the patient.  Epistaxis Location:  Bilateral Severity:  Mild Duration:  3 hours Timing:  Sporadic Progression:  Unchanged Chronicity:  Recurrent Context: anticoagulants   Context comment:  Fall 2 days ago Relieved by:  Applying pressure Worsened by:  Nothing tried Ineffective treatments:  None tried Associated symptoms: no congestion, no cough, no dizziness, no fever and no headaches     Past Medical History  Diagnosis Date  . Hypokalemia   . HTN (hypertension)   . Sleep apnea   . Endometrial polyp   . CAD (coronary artery disease)     a. NSTEMI 03/2012 ([place] 03/27/12: pLAD 30%, mLAD 99%, mCFX 95%, mRCA 99% with thrombus, EF 65%);  s/p PTCA/DESx1 to prox-mid RCA 03/27/12 urgently in setting of hypotension/bradycardia, with staged PTCA/Evolve study stent to mid LAD & PTCA/Evolve study stent to prox LCx 03/29/12 ;   echo 03/28/12:EF 60%, Aortic sclerosis without AS, mild RVE, mild reduced RVSF    . Hyperglycemia   . Hyperlipidemia   . Atrial tachycardia     a. noted at cardiac rehab 5/13; event monitor ordered to assess for AFib  . MI, old    Past Surgical History  Procedure Laterality Date  . Cholecystectomy  1978  . Appendectomy  73 years old  . Tonsillectomy  73 years old  . Cesarean section  1984  . Carotid stent  04/2012    x3  . Left heart catheterization with coronary angiogram N/A 03/27/2012    Procedure: LEFT HEART CATHETERIZATION WITH CORONARY ANGIOGRAM;  Surgeon: Burnell Blanks, MD;  Location: [facility] CATH LAB;  Service: Cardiovascular;  Laterality: N/A;  . Percutaneous coronary stent intervention (pci-s) N/A 03/27/2012     Procedure: PERCUTANEOUS CORONARY STENT INTERVENTION (PCI-S);  Surgeon: Burnell Blanks, MD;  Location: [facility] CATH LAB;  Service: Cardiovascular;  Laterality: N/A;  . Percutaneous coronary stent intervention (pci-s) N/A 03/29/2012    Procedure: PERCUTANEOUS CORONARY STENT INTERVENTION (PCI-S);  Surgeon: [name], MD;  Location: [facility] CATH LAB;  Service: Cardiovascular;  Laterality: N/A;   Family History  Problem Relation Age of Onset  . Coronary artery disease Mother     CABG at age 57  . Cancer Father     larynx   History  Substance Use Topics  . Smoking status: Former Smoker    Types: Cigarettes    Quit date: 04/19/1980  . Smokeless tobacco: Never Used  . Alcohol Use: 12.0 oz/week    20 Glasses of wine per week     Comment: socially   OB History    No data available     Review of Systems  Constitutional: Negative for fever and fatigue.  HENT: Positive for nosebleeds. Negative for congestion and drooling.        Epistaxis  Eyes: Negative for pain.  Respiratory: Negative for cough and shortness of breath.   Cardiovascular: Negative for chest pain.  Gastrointestinal: Negative for nausea, vomiting, abdominal pain and diarrhea.  Genitourinary: Negative for dysuria and hematuria.  Musculoskeletal: Negative for back pain, gait problem and neck pain.  Skin: Negative for color change.  Neurological: Negative for dizziness and headaches.  Hematological: Negative for adenopathy.  Psychiatric/Behavioral: Negative for behavioral problems.  All other systems reviewed and are negative.     Allergies  Penicillins; Sulfonamide derivatives; and Zinc oxide  Home Medications   Prior to Admission medications   Medication Sig Start Date End Date Taking? Authorizing Provider  ALPRAZolam Duanne Moron) 0.5 MG tablet Take 0.5 mg by mouth 2 (two) times daily as needed for anxiety or sleep. 03/24/13  Yes [name], MD  amLODipine (NORVASC) 5 MG tablet TAKE 1 TABLET (5 MG TOTAL) BY MOUTH  DAILY. 09/01/14  Yes [name], MD  aspirin 81 MG tablet Take 1 tablet (81 mg total) by mouth daily. 03/30/12  Yes Dayna N Dunn, PA-C  buPROPion (WELLBUTRIN XL) 150 MG 24 hr tablet Take 1 tablet (150 mg total) by mouth daily. 10/19/14  Yes [name], MD  Calcium Carbonate-Vitamin D (CALTRATE 600+D) 600-400 MG-UNIT per tablet Take 1 tablet by mouth 2 (two) times daily.    Yes Historical Provider, MD  clopidogrel (PLAVIX) 75 MG tablet Take 1 tablet (75 mg total) by mouth daily. 06/08/14  Yes Blane Ohara, MD  CRESTOR 10 MG tablet TAKE 1 TABLET (10 MG TOTAL) BY MOUTH DAILY. 10/19/14  Yes Blane Ohara, MD  escitalopram (LEXAPRO) 20 MG tablet Take 1 tablet (20 mg total) by mouth daily. 03/28/14  Yes [name], MD  esomeprazole (NEXIUM) 40 MG capsule Take 40 mg by mouth daily at 12 noon.   Yes Historical Provider, MD  folic acid-pyridoxine-cyancobalamin (FOLBIC) 2.5-25-2 MG TABS Take 1 tablet by mouth daily.   Yes Historical Provider, MD  hydrochlorothiazide (HYDRODIURIL) 25 MG tablet TAKE 1/2 TABLET BY MOUTH DAILY. 09/10/14  Yes Blane Ohara, MD  HYDROcodone-acetaminophen [facility]) 10-325 MG per tablet Take 1 tablet by mouth every 8 (eight) hours as needed for severe pain. 11/22/14  Yes [name], MD  metoprolol (LOPRESSOR) 50 MG tablet Take 50 mg by mouth 2 (two) times daily.   Yes Historical Provider, MD  nitroGLYCERIN (NITROSTAT) 0.4 MG SL tablet Place 1 tablet (0.4 mg total) under the tongue every 5 (five) minutes x 3 doses as needed for chest pain. 10/18/13 12/14/14 Yes Blane Ohara, MD  bacitracin ointment Apply topically daily. Patient not taking: Reported on 11/22/2014 07/04/14   Jonetta Osgood, MD  flurazepam [facility]) 30 MG capsule Take 1 capsule (30 mg total) by mouth at bedtime as needed for sleep. Patient not taking: Reported on 12/14/2014 11/22/14   [name], MD  ketoconazole (NIZORAL) 2 % cream Apply 1 application topically daily. Use under right breast  daily. Patient not taking: Reported on 12/14/2014 09/27/14   [name], MD  mupirocin ointment (BACTROBAN) 2 % Apply in nostril once daily.  Use on topical skin lesions twice daily. Patient not taking: Reported on 12/14/2014 09/27/14   [name], MD   BP 126/52 mmHg  Pulse 59  Resp 16  SpO2 96% Physical Exam  Constitutional: She is oriented to person, place, and time. She appears well-developed and well-nourished.  HENT:  Mouth/Throat: Oropharynx is clear and moist. No oropharyngeal exudate.  Normal-appearing tympanic membranes bilaterally.  Normal appearance of the anterior nares without any evidence of active bleeding anteriorly. There is a slight trickle of bright red blood down the posterior oropharynx.  Patient has old appearing ecchymosis infraorbitally bilaterally. Also some bruising across the bridge of her nose.  Eyes: Conjunctivae and EOM are normal. Pupils are equal, round, and reactive to light.  Neck: Normal  range of motion. Neck supple.  No focal tenderness of the vertebrae.   Cardiovascular: Normal rate, regular rhythm, normal heart sounds and intact distal pulses.  Exam reveals no gallop and no friction rub.   No murmur heard. Pulmonary/Chest: Effort normal and breath sounds normal. No respiratory distress. She has no wheezes.  Abdominal: Soft. Bowel sounds are normal. There is no tenderness. There is no rebound and no guarding.  Musculoskeletal: Normal range of motion. She exhibits no edema or tenderness.  Neurological: She is alert and oriented to person, place, and time.  Skin: Skin is warm and dry.  Psychiatric: She has a normal mood and affect. Her behavior is normal.  Nursing note and vitals reviewed.   ED Course  Procedures (including critical care time) Labs Review Labs Reviewed  CBC WITH DIFFERENTIAL - Abnormal; Notable for the following:    Platelets 127 (*)    All other components within normal limits  BASIC METABOLIC PANEL - Abnormal; Notable  for the following:    GFR calc non Af Amer 69 (*)    GFR calc Af Amer 80 (*)    All other components within normal limits    Imaging Review Ct Head Wo Contrast  12/14/2014   CLINICAL DATA:  Fall 3 days ago, hitting face, intermittent nose bleed  EXAM: CT HEAD WITHOUT CONTRAST  CT MAXILLOFACIAL WITHOUT CONTRAST  TECHNIQUE: Multidetector CT imaging of the head and maxillofacial structures were performed using the standard protocol without intravenous contrast. Multiplanar CT image reconstructions of the maxillofacial structures were also generated.  COMPARISON:  None.  FINDINGS: CT HEAD FINDINGS  No skull fracture is noted. Paranasal sinuses shows mild mucosal thickening with air-fluid level posterior aspect of the left sphenoid sinus. Mild mucosal thickening left ethmoid air cells. The mastoid air cells are unremarkable.  No intracranial hemorrhage, mass effect or midline shift.  Mild atherosclerotic calcifications of carotid siphon. No acute cortical infarction. Mild cerebral atrophy. Mild periventricular white matter decreased attenuation probable due to chronic small vessel ischemic changes.  CT MAXILLOFACIAL FINDINGS  Axial images shows no facial fractures. High-density material within left nasal cavity most likely represents packing material.  No intraorbital hematoma. Bilateral eye globes are symmetrical in appearance. Mild perinasal soft tissue swelling. No facial fluid collection. Minimal frontal scalp swelling.  No zygomatic fracture is noted. The maxillary sinuses are unremarkable. Small air-fluid level noted posterior aspect of the left sphenoid sinus. The mastoid air cells are unremarkable.  Coronal images shows right nasal septum deviation. The nasal turbinates are unremarkable. Bilateral semilunar canal is unremarkable. Extensive metallic dental artifact are noted. There is no mandibular fracture. No TMJ dislocation.  Sagittal images shows minimal depressed fracture of tip of the nasal bone see  axial image 41  No maxillary spine fracture is noted. Visualized upper cervical spine shows degenerative changes C1-C2 articulation. There is disc space flattening with mild anterior and mild posterior spurring at C5-C6 level. The nasopharyngeal and oropharyngeal airway is unremarkable.  IMPRESSION: 1. There is minimal depressed fracture at the tip of nasal bone see sagittal image 41. 2. There is right nasal septum deviation. Probable packing material noted in left nasal cavity. 3. No zygomatic fracture. No mandibular fracture. No orbital rim or orbital floor fracture. 4. Mucosal thickening with partial opacification left ethmoid air cells. Mild mucosal thickening with small posterior air-fluid level in left sphenoid sinus. 5. Patent nasopharyngeal and oropharyngeal airway. Degenerative changes cervical spine as described above. 6. No acute intracranial abnormality. Mild cerebral atrophy.  Mild periventricular white matter decreased attenuation probable due to chronic small vessel ischemic changes.   Electronically Signed   By: Lahoma Crocker M.D.   On: 12/14/2014 10:52   Ct Maxillofacial Wo Cm  12/14/2014   CLINICAL DATA:  Fall 3 days ago, hitting face, intermittent nose bleed  EXAM: CT HEAD WITHOUT CONTRAST  CT MAXILLOFACIAL WITHOUT CONTRAST  TECHNIQUE: Multidetector CT imaging of the head and maxillofacial structures were performed using the standard protocol without intravenous contrast. Multiplanar CT image reconstructions of the maxillofacial structures were also generated.  COMPARISON:  None.  FINDINGS: CT HEAD FINDINGS  No skull fracture is noted. Paranasal sinuses shows mild mucosal thickening with air-fluid level posterior aspect of the left sphenoid sinus. Mild mucosal thickening left ethmoid air cells. The mastoid air cells are unremarkable.  No intracranial hemorrhage, mass effect or midline shift.  Mild atherosclerotic calcifications of carotid siphon. No acute cortical infarction. Mild cerebral  atrophy. Mild periventricular white matter decreased attenuation probable due to chronic small vessel ischemic changes.  CT MAXILLOFACIAL FINDINGS  Axial images shows no facial fractures. High-density material within left nasal cavity most likely represents packing material.  No intraorbital hematoma. Bilateral eye globes are symmetrical in appearance. Mild perinasal soft tissue swelling. No facial fluid collection. Minimal frontal scalp swelling.  No zygomatic fracture is noted. The maxillary sinuses are unremarkable. Small air-fluid level noted posterior aspect of the left sphenoid sinus. The mastoid air cells are unremarkable.  Coronal images shows right nasal septum deviation. The nasal turbinates are unremarkable. Bilateral semilunar canal is unremarkable. Extensive metallic dental artifact are noted. There is no mandibular fracture. No TMJ dislocation.  Sagittal images shows minimal depressed fracture of tip of the nasal bone see axial image 41  No maxillary spine fracture is noted. Visualized upper cervical spine shows degenerative changes C1-C2 articulation. There is disc space flattening with mild anterior and mild posterior spurring at C5-C6 level. The nasopharyngeal and oropharyngeal airway is unremarkable.  IMPRESSION: 1. There is minimal depressed fracture at the tip of nasal bone see sagittal image 41. 2. There is right nasal septum deviation. Probable packing material noted in left nasal cavity. 3. No zygomatic fracture. No mandibular fracture. No orbital rim or orbital floor fracture. 4. Mucosal thickening with partial opacification left ethmoid air cells. Mild mucosal thickening with small posterior air-fluid level in left sphenoid sinus. 5. Patent nasopharyngeal and oropharyngeal airway. Degenerative changes cervical spine as described above. 6. No acute intracranial abnormality. Mild cerebral atrophy. Mild periventricular white matter decreased attenuation probable due to chronic small vessel  ischemic changes.   Electronically Signed   By: Lahoma Crocker M.D.   On: 12/14/2014 10:52     EKG Interpretation None      MDM   Final diagnoses:  Fall  Epistaxis  Nasal bone fracture, closed, initial encounter    9:27 AM 73 y.o. female w hx of CAD s/p PCI on asa/plavix who presents with a fall and epistaxis. She states that she had a mechanical fall on her house 2 days ago. She slipped and fell forward hitting her face on the carpet. She denies loss of consciousness. She has developed infraorbital bruising and bruising across the bridge of her nose. She had epistaxis at the time of the fall and has had intermittently since then. It has been easily controlled with compression until this morning around 5 AM when it started bleeding again. It has been trickling since that time. It appears to be coming from both naris,  possibly worse from the left ear. No active bleeding from the nares at this time. She does continue to have a slight trickle of blood down the posterior oropharynx. Will use lidocaine with epi mixed with Afrin and nasal compression. We'll get screening imaging and labs and reassess shortly. Vital signs unremarkable. The patient did take her Plavix today.  Upon repeat examination I saw a small area of active bleeding on the left and air medial upper anterior septum. I used a silver nitrate stick to this area. The bleeding did stop after this. The patient has continued to appear well in the ER with only a mild minor nasal bone fracture noted on CT.   11:39 AM: I interpreted/reviewed the labs and/or imaging which were non-contributory. The patient and family were educated about nosebleeds and how to stop the bleeding. Given afrin for home.  I have discussed the diagnosis/risks/treatment options with the patient and family and believe the pt to be eligible for discharge home to follow-up with ENT as needed. We also discussed returning to the ED immediately if new or worsening sx occur. We  discussed the sx which are most concerning (e.g., recurrent nose bleed) that necessitate immediate return. Medications administered to the patient during their visit and any new prescriptions provided to the patient are listed below.  Medications given during this visit Medications - No data to display  New Prescriptions   No medications on file     Pamella Pert, MD 12/14/14 1142

## 2014-12-17 ENCOUNTER — Encounter (HOSPITAL_COMMUNITY): Payer: Self-pay

## 2014-12-19 ENCOUNTER — Encounter (HOSPITAL_COMMUNITY): Payer: Self-pay

## 2014-12-20 ENCOUNTER — Encounter: Payer: Self-pay | Admitting: Cardiovascular Disease

## 2014-12-20 ENCOUNTER — Ambulatory Visit (INDEPENDENT_AMBULATORY_CARE_PROVIDER_SITE_OTHER): Payer: Medicare Other | Admitting: Cardiovascular Disease

## 2014-12-20 VITALS — BP 116/72 | HR 70 | Ht 61.5 in | Wt 162.6 lb

## 2014-12-20 DIAGNOSIS — I208 Other forms of angina pectoris: Secondary | ICD-10-CM | POA: Diagnosis not present

## 2014-12-20 DIAGNOSIS — I2510 Atherosclerotic heart disease of native coronary artery without angina pectoris: Secondary | ICD-10-CM

## 2014-12-20 NOTE — Progress Notes (Signed)
Background: The patient is followed for coronary artery disease. She underwent multivessel stenting in April 2013 after presenting with a non-ST elevation MI. She was treated with stenting of the right coronary artery initially followed by staged PCI of the LAD and left circumflex. She was treated with drug-eluting stents. Her left ventricular function was normal with an EF of 60%. She had a stress Myoview scan in December 2013 and this showed normal perfusion and normal LV function. She again presented with chest pain in May 2015 and was evaluated with observation in the hospital. She ruled out for myocardial infarction and underwent a stress echocardiogram demonstrating no ischemic changes.  HPI:  73 year old woman presenting for follow-up evaluation. She fell last week and hit her face. There was no lightheadedness or syncope, but she sustained a mechanical fall. She's had significant facial bruising but is now getting better. From a cardiac perspective, she reports no change in her symptoms. No shortness of breath or leg swelling. She does have angina with moderate level activity. She describes a pressure like sensation in the left chest without radiation. There is no change in her symptoms. She is able to do all of her normal activities without problems. She has never required sublingual nitroglycerin.   Outpatient Encounter Prescriptions as of 12/20/2014  Medication Sig  . ALPRAZolam (XANAX) 0.5 MG tablet Take 0.5 mg by mouth 2 (two) times daily as needed for anxiety or sleep.  Marland Kitchen amLODipine (NORVASC) 5 MG tablet TAKE 1 TABLET (5 MG TOTAL) BY MOUTH DAILY.  Marland Kitchen aspirin 81 MG tablet Take 1 tablet (81 mg total) by mouth daily.  . bacitracin ointment Apply topically daily.  Marland Kitchen buPROPion (WELLBUTRIN XL) 150 MG 24 hr tablet Take 1 tablet (150 mg total) by mouth daily.  . Calcium Carbonate-Vitamin D (CALTRATE 600+D) 600-400 MG-UNIT per tablet Take 1 tablet by mouth 2 (two) times daily.   . clopidogrel  (PLAVIX) 75 MG tablet Take 1 tablet (75 mg total) by mouth daily.  . CRESTOR 10 MG tablet TAKE 1 TABLET (10 MG TOTAL) BY MOUTH DAILY.  Marland Kitchen escitalopram (LEXAPRO) 20 MG tablet Take 1 tablet (20 mg total) by mouth daily.  Marland Kitchen esomeprazole (NEXIUM) 40 MG capsule Take 40 mg by mouth daily at 12 noon.  . flurazepam (DALMANE) 30 MG capsule Take 1 capsule (30 mg total) by mouth at bedtime as needed for sleep.  . folic acid-pyridoxine-cyancobalamin (FOLBIC) 2.5-25-2 MG TABS Take 1 tablet by mouth daily.  . hydrochlorothiazide (HYDRODIURIL) 25 MG tablet TAKE 1/2 TABLET BY MOUTH DAILY.  Marland Kitchen HYDROcodone-acetaminophen (NORCO) 10-325 MG per tablet Take 1 tablet by mouth every 8 (eight) hours as needed for severe pain.  . metoprolol (LOPRESSOR) 50 MG tablet Take 50 mg by mouth 2 (two) times daily.  . nitroGLYCERIN (NITROSTAT) 0.4 MG SL tablet Place 1 tablet (0.4 mg total) under the tongue every 5 (five) minutes x 3 doses as needed for chest pain.  . [DISCONTINUED] ketoconazole (NIZORAL) 2 % cream Apply 1 application topically daily. Use under right breast daily. (Patient not taking: Reported on 12/14/2014)  . [DISCONTINUED] mupirocin ointment (BACTROBAN) 2 % Apply in nostril once daily.  Use on topical skin lesions twice daily. (Patient not taking: Reported on 12/14/2014)  . [DISCONTINUED] temazepam (RESTORIL) 30 MG capsule     Allergies  Allergen Reactions  . Penicillins Hives and Swelling  . Sulfonamide Derivatives Hives and Swelling  . Zinc Oxide Rash    Past Medical History  Diagnosis Date  .  Hypokalemia   . HTN (hypertension)   . Sleep apnea   . Endometrial polyp   . CAD (coronary artery disease)     a. NSTEMI 03/2012 ([place] 03/27/12: pLAD 30%, mLAD 99%, mCFX 95%, mRCA 99% with thrombus, EF 65%);  s/p PTCA/DESx1 to prox-mid RCA 03/27/12 urgently in setting of hypotension/bradycardia, with staged PTCA/Evolve study stent to mid LAD & PTCA/Evolve study stent to prox LCx 03/29/12 ;   echo 03/28/12:EF 60%, Aortic  sclerosis without AS, mild RVE, mild reduced RVSF    . Hyperglycemia   . Hyperlipidemia   . Atrial tachycardia     a. noted at cardiac rehab 5/13; event monitor ordered to assess for AFib  . MI, old     family history includes Cancer in her father; Coronary artery disease in her mother.   ROS: Positive for depression, otherwise negative except as per HPI  BP 116/72 mmHg  Pulse 70  Ht 5' 1.5" (1.562 m)  Wt 162 lb 9.6 oz (73.755 kg)  BMI 30.23 kg/m2  PHYSICAL EXAM: Pt is alert and oriented, NAD HEENT: normal Neck: JVP - normal, carotids 2+= without bruits Lungs: CTA bilaterally CV: RRR without murmur or gallop Abd: soft, NT, Positive BS, no hepatomegaly Ext: no C/C/E, distal pulses intact and equal Skin: warm/dry no rash  ASSESSMENT AND PLAN: 1. Coronary artery disease, native vessel, with exertional angina. She is CCS class 2.  Medications were reviewed and will continue her current therapy. Her blood pressure is very well controlled. If anginal symptoms worsen she will call, otherwise I will see her back in 6 months. She will remain on dual antiplatelet therapy with aspirin and Plavix after multivessel stenting.    2. Essential hypertension. Blood pressure is well controlled on her clinical program.  3. Hyperlipidemia. She remains on Crestor 10 mg daily. Lipid Panel     Component Value Date/Time   CHOL 172 05/01/2014 0620   TRIG 116 05/01/2014 0620   HDL 92 05/01/2014 0620   CHOLHDL 1.9 05/01/2014 0620   VLDL 23 05/01/2014 0620   LDLCALC 57 05/01/2014 0620   LDLDIRECT 152.4 03/09/2012 0900     [name], MD 12/20/2014 5:02 PM

## 2014-12-20 NOTE — Patient Instructions (Signed)
Your physician wants you to follow-up in: 6 months with Dr. Cooper.  You will receive a reminder letter in the mail two months in advance. If you don't receive a letter, please call our office to schedule the follow-up appointment.  Your physician recommends that you continue on your current medications as directed. Please refer to the Current Medication list given to you today.  

## 2014-12-24 ENCOUNTER — Encounter (HOSPITAL_COMMUNITY)
Admission: RE | Admit: 2014-12-24 | Discharge: 2014-12-24 | Disposition: A | Discharge status: Home / Self Care | Payer: Self-pay | Source: Ambulatory Visit | Attending: Cardiovascular Disease | Admitting: Cardiovascular Disease

## 2014-12-24 DIAGNOSIS — I499 Cardiac arrhythmia, unspecified: Secondary | ICD-10-CM | POA: Insufficient documentation

## 2014-12-24 DIAGNOSIS — I10 Essential (primary) hypertension: Secondary | ICD-10-CM | POA: Insufficient documentation

## 2014-12-24 DIAGNOSIS — I2510 Atherosclerotic heart disease of native coronary artery without angina pectoris: Secondary | ICD-10-CM | POA: Insufficient documentation

## 2014-12-24 DIAGNOSIS — Z9861 Coronary angioplasty status: Secondary | ICD-10-CM | POA: Insufficient documentation

## 2014-12-24 DIAGNOSIS — I252 Old myocardial infarction: Secondary | ICD-10-CM | POA: Insufficient documentation

## 2014-12-24 DIAGNOSIS — Z5189 Encounter for other specified aftercare: Secondary | ICD-10-CM | POA: Insufficient documentation

## 2014-12-24 DIAGNOSIS — E785 Hyperlipidemia, unspecified: Secondary | ICD-10-CM | POA: Insufficient documentation

## 2014-12-26 ENCOUNTER — Encounter (HOSPITAL_COMMUNITY)
Admission: RE | Admit: 2014-12-26 | Discharge: 2014-12-26 | Disposition: A | Discharge status: Home / Self Care | Payer: Self-pay | Source: Ambulatory Visit | Attending: Cardiovascular Disease | Admitting: Cardiovascular Disease

## 2014-12-28 ENCOUNTER — Encounter (HOSPITAL_COMMUNITY)
Admission: RE | Admit: 2014-12-28 | Discharge: 2014-12-28 | Disposition: A | Discharge status: Home / Self Care | Payer: Self-pay | Source: Ambulatory Visit | Attending: Cardiovascular Disease | Admitting: Cardiovascular Disease

## 2014-12-29 ENCOUNTER — Other Ambulatory Visit: Payer: Self-pay | Admitting: Cardiovascular Disease

## 2014-12-31 ENCOUNTER — Encounter (HOSPITAL_COMMUNITY)
Admission: RE | Admit: 2014-12-31 | Discharge: 2014-12-31 | Disposition: A | Discharge status: Home / Self Care | Payer: Self-pay | Source: Ambulatory Visit | Attending: Cardiovascular Disease | Admitting: Cardiovascular Disease

## 2015-01-02 ENCOUNTER — Encounter (HOSPITAL_COMMUNITY)
Admission: RE | Admit: 2015-01-02 | Discharge: 2015-01-02 | Disposition: A | Discharge status: Home / Self Care | Payer: Self-pay | Source: Ambulatory Visit | Attending: Cardiovascular Disease | Admitting: Cardiovascular Disease

## 2015-01-03 ENCOUNTER — Other Ambulatory Visit (HOSPITAL_COMMUNITY): Payer: Self-pay | Admitting: Urology

## 2015-01-03 ENCOUNTER — Other Ambulatory Visit: Payer: Self-pay | Admitting: Urology

## 2015-01-03 DIAGNOSIS — N281 Cyst of kidney, acquired: Secondary | ICD-10-CM

## 2015-01-04 ENCOUNTER — Encounter (HOSPITAL_COMMUNITY)
Admission: RE | Admit: 2015-01-04 | Discharge: 2015-01-04 | Disposition: A | Discharge status: Home / Self Care | Payer: Self-pay | Source: Ambulatory Visit | Attending: Cardiovascular Disease | Admitting: Cardiovascular Disease

## 2015-01-07 ENCOUNTER — Encounter (HOSPITAL_COMMUNITY): Payer: Self-pay

## 2015-01-09 ENCOUNTER — Encounter (HOSPITAL_COMMUNITY): Payer: Self-pay

## 2015-01-11 ENCOUNTER — Encounter (HOSPITAL_COMMUNITY): Payer: Self-pay

## 2015-01-14 ENCOUNTER — Encounter (HOSPITAL_COMMUNITY): Payer: Self-pay

## 2015-01-16 ENCOUNTER — Encounter (HOSPITAL_COMMUNITY)
Admission: RE | Admit: 2015-01-16 | Discharge: 2015-01-16 | Disposition: A | Discharge status: Home / Self Care | Payer: Self-pay | Source: Ambulatory Visit | Attending: Cardiovascular Disease | Admitting: Cardiovascular Disease

## 2015-01-18 ENCOUNTER — Encounter (HOSPITAL_COMMUNITY)
Admission: RE | Admit: 2015-01-18 | Discharge: 2015-01-18 | Disposition: A | Discharge status: Home / Self Care | Payer: Self-pay | Source: Ambulatory Visit | Attending: Cardiovascular Disease | Admitting: Cardiovascular Disease

## 2015-01-21 ENCOUNTER — Encounter (HOSPITAL_COMMUNITY)
Admission: RE | Admit: 2015-01-21 | Discharge: 2015-01-21 | Disposition: A | Discharge status: Home / Self Care | Payer: Self-pay | Source: Ambulatory Visit | Attending: Cardiovascular Disease | Admitting: Cardiovascular Disease

## 2015-01-21 DIAGNOSIS — I252 Old myocardial infarction: Secondary | ICD-10-CM | POA: Insufficient documentation

## 2015-01-21 DIAGNOSIS — Z5189 Encounter for other specified aftercare: Secondary | ICD-10-CM | POA: Insufficient documentation

## 2015-01-21 DIAGNOSIS — E785 Hyperlipidemia, unspecified: Secondary | ICD-10-CM | POA: Insufficient documentation

## 2015-01-21 DIAGNOSIS — I10 Essential (primary) hypertension: Secondary | ICD-10-CM | POA: Insufficient documentation

## 2015-01-21 DIAGNOSIS — I499 Cardiac arrhythmia, unspecified: Secondary | ICD-10-CM | POA: Insufficient documentation

## 2015-01-21 DIAGNOSIS — Z9861 Coronary angioplasty status: Secondary | ICD-10-CM | POA: Insufficient documentation

## 2015-01-21 DIAGNOSIS — I2510 Atherosclerotic heart disease of native coronary artery without angina pectoris: Secondary | ICD-10-CM | POA: Insufficient documentation

## 2015-01-23 ENCOUNTER — Encounter (HOSPITAL_COMMUNITY)
Admission: RE | Admit: 2015-01-23 | Discharge: 2015-01-23 | Disposition: A | Discharge status: Home / Self Care | Payer: Self-pay | Source: Ambulatory Visit | Attending: Cardiovascular Disease | Admitting: Cardiovascular Disease

## 2015-01-24 ENCOUNTER — Encounter: Payer: Self-pay | Admitting: Internal Medicine

## 2015-01-24 ENCOUNTER — Ambulatory Visit (INDEPENDENT_AMBULATORY_CARE_PROVIDER_SITE_OTHER): Payer: Medicare Other | Admitting: Internal Medicine

## 2015-01-24 VITALS — BP 116/60 | HR 71 | Temp 97.8°F | Wt 167.0 lb

## 2015-01-24 DIAGNOSIS — G4452 New daily persistent headache (NDPH): Secondary | ICD-10-CM | POA: Diagnosis not present

## 2015-01-24 DIAGNOSIS — Z8679 Personal history of other diseases of the circulatory system: Secondary | ICD-10-CM

## 2015-01-24 DIAGNOSIS — G4700 Insomnia, unspecified: Secondary | ICD-10-CM

## 2015-01-24 DIAGNOSIS — R5382 Chronic fatigue, unspecified: Secondary | ICD-10-CM | POA: Diagnosis not present

## 2015-01-24 DIAGNOSIS — I2510 Atherosclerotic heart disease of native coronary artery without angina pectoris: Secondary | ICD-10-CM | POA: Diagnosis not present

## 2015-01-24 DIAGNOSIS — R413 Other amnesia: Secondary | ICD-10-CM | POA: Diagnosis not present

## 2015-01-24 DIAGNOSIS — F32A Depression, unspecified: Secondary | ICD-10-CM

## 2015-01-24 DIAGNOSIS — F329 Major depressive disorder, single episode, unspecified: Secondary | ICD-10-CM | POA: Diagnosis not present

## 2015-01-25 ENCOUNTER — Other Ambulatory Visit: Payer: Medicare Other | Admitting: Internal Medicine

## 2015-01-25 ENCOUNTER — Encounter (HOSPITAL_COMMUNITY)
Admission: RE | Admit: 2015-01-25 | Discharge: 2015-01-25 | Disposition: A | Discharge status: Home / Self Care | Payer: Self-pay | Source: Ambulatory Visit | Attending: Cardiovascular Disease | Admitting: Cardiovascular Disease

## 2015-01-25 ENCOUNTER — Telehealth: Payer: Self-pay | Admitting: *Deleted

## 2015-01-25 ENCOUNTER — Other Ambulatory Visit: Payer: Self-pay | Admitting: *Deleted

## 2015-01-25 ENCOUNTER — Telehealth: Payer: Self-pay | Admitting: Internal Medicine

## 2015-01-25 DIAGNOSIS — R413 Other amnesia: Secondary | ICD-10-CM | POA: Diagnosis not present

## 2015-01-25 DIAGNOSIS — I2510 Atherosclerotic heart disease of native coronary artery without angina pectoris: Secondary | ICD-10-CM

## 2015-01-25 DIAGNOSIS — E538 Deficiency of other specified B group vitamins: Secondary | ICD-10-CM

## 2015-01-25 DIAGNOSIS — R5383 Other fatigue: Secondary | ICD-10-CM | POA: Diagnosis not present

## 2015-01-25 LAB — COMPREHENSIVE METABOLIC PANEL
ALT: 15 U/L (ref 0–35)
AST: 21 U/L (ref 0–37)
Albumin: 4.3 g/dL (ref 3.5–5.2)
Alkaline Phosphatase: 71 U/L (ref 39–117)
BUN: 13 mg/dL (ref 6–23)
CALCIUM: 9.3 mg/dL (ref 8.4–10.5)
CO2: 36 meq/L — AB (ref 19–32)
Chloride: 99 mEq/L (ref 96–112)
Creat: 0.82 mg/dL (ref 0.50–1.10)
GLUCOSE: 88 mg/dL (ref 70–99)
Potassium: 4.1 mEq/L (ref 3.5–5.3)
Sodium: 141 mEq/L (ref 135–145)
Total Bilirubin: 0.5 mg/dL (ref 0.2–1.2)
Total Protein: 6.6 g/dL (ref 6.0–8.3)

## 2015-01-25 LAB — LIPID PANEL
CHOLESTEROL: 130 mg/dL (ref 0–200)
HDL: 61 mg/dL (ref >39)
LDL Cholesterol: 47 mg/dL (ref 0–99)
Total CHOL/HDL Ratio: 2.1 Ratio
Triglycerides: 110 mg/dL (ref <150)
VLDL: 22 mg/dL (ref 0–40)

## 2015-01-25 LAB — CBC WITH DIFFERENTIAL/PLATELET
BASOS ABS: 0 10*3/uL (ref 0.0–0.1)
Basophils Relative: 0 % (ref 0–1)
EOS ABS: 0.1 10*3/uL (ref 0.0–0.7)
Eosinophils Relative: 3 % (ref 0–5)
HEMATOCRIT: 37.8 % (ref 36.0–46.0)
Hemoglobin: 12.4 g/dL (ref 12.0–15.0)
LYMPHS PCT: 44 % (ref 12–46)
Lymphs Abs: 1.7 10*3/uL (ref 0.7–4.0)
MCH: 30.5 pg (ref 26.0–34.0)
MCHC: 32.8 g/dL (ref 30.0–36.0)
MCV: 93.1 fL (ref 78.0–100.0)
MPV: 10.1 fL (ref 8.6–12.4)
Monocytes Absolute: 0.3 10*3/uL (ref 0.1–1.0)
Monocytes Relative: 7 % (ref 3–12)
NEUTROS ABS: 1.7 10*3/uL (ref 1.7–7.7)
Neutrophils Relative %: 46 % (ref 43–77)
PLATELETS: 178 10*3/uL (ref 150–400)
RBC: 4.06 MIL/uL (ref 3.87–5.11)
RDW: 13.1 % (ref 11.5–15.5)
WBC: 3.8 10*3/uL — ABNORMAL LOW (ref 4.0–10.5)

## 2015-01-25 MED ORDER — DESVENLAFAXINE SUCCINATE ER 50 MG PO TB24: 50.0000 mg | ORAL_TABLET | Freq: Every day | ORAL | Status: DC

## 2015-01-25 MED ORDER — HYDROCODONE-ACETAMINOPHEN 10-325 MG PO TABS: 1.0000 | ORAL_TABLET | Freq: Three times a day (TID) | ORAL | Status: DC | PRN

## 2015-01-25 NOTE — Telephone Encounter (Signed)
LMOM (cell #) that her Rx is ready for pick up and that her Pristiq had been sent to her pharmacy.  Patient instructed to call with any questions.

## 2015-01-25 NOTE — Telephone Encounter (Signed)
States her Prystiq didn't get sent to her pharmacy ([facility]).  And, she needs a refill on her Vicodin as well please.

## 2015-01-25 NOTE — Telephone Encounter (Signed)
Script for [employer] and new script for Pristiq sent to pharmacy as instructed  By Dr Renold Genta

## 2015-01-25 NOTE — Telephone Encounter (Signed)
Start Pristiq 50 mg daily and refill Vicodin

## 2015-01-26 LAB — TSH: TSH: 2.544 u[IU]/mL (ref 0.350–4.500)

## 2015-01-26 LAB — FOLATE: Folate: >20 ng/mL

## 2015-01-26 LAB — VITAMIN B12

## 2015-01-27 ENCOUNTER — Other Ambulatory Visit: Payer: Self-pay | Admitting: Internal Medicine

## 2015-01-28 ENCOUNTER — Other Ambulatory Visit: Payer: Self-pay | Admitting: *Deleted

## 2015-01-28 ENCOUNTER — Encounter (HOSPITAL_COMMUNITY)
Admission: RE | Admit: 2015-01-28 | Discharge: 2015-01-28 | Disposition: A | Discharge status: Home / Self Care | Payer: Self-pay | Source: Ambulatory Visit | Attending: Cardiovascular Disease | Admitting: Cardiovascular Disease

## 2015-01-28 MED ORDER — ALPRAZOLAM 0.5 MG PO TABS: 0.5000 mg | ORAL_TABLET | Freq: Two times a day (BID) | ORAL | Status: DC | PRN

## 2015-01-28 NOTE — Telephone Encounter (Signed)
Alprazolam refilled called in to pharmacy

## 2015-01-28 NOTE — Telephone Encounter (Signed)
Refill once 

## 2015-01-30 ENCOUNTER — Encounter (HOSPITAL_COMMUNITY)
Admission: RE | Admit: 2015-01-30 | Discharge: 2015-01-30 | Disposition: A | Discharge status: Home / Self Care | Payer: Self-pay | Source: Ambulatory Visit | Attending: Cardiovascular Disease | Admitting: Cardiovascular Disease

## 2015-01-30 NOTE — Telephone Encounter (Signed)
Sent referral to [facility] for Dr Dohmeier to see patient for memory loss

## 2015-02-01 ENCOUNTER — Encounter (HOSPITAL_COMMUNITY)
Admission: RE | Admit: 2015-02-01 | Discharge: 2015-02-01 | Disposition: A | Discharge status: Home / Self Care | Payer: Self-pay | Source: Ambulatory Visit | Attending: Cardiovascular Disease | Admitting: Cardiovascular Disease

## 2015-02-04 ENCOUNTER — Encounter (HOSPITAL_COMMUNITY): Payer: Self-pay

## 2015-02-05 ENCOUNTER — Telehealth: Payer: Self-pay | Admitting: *Deleted

## 2015-02-05 DIAGNOSIS — R42 Dizziness and giddiness: Secondary | ICD-10-CM

## 2015-02-05 DIAGNOSIS — R413 Other amnesia: Secondary | ICD-10-CM

## 2015-02-06 ENCOUNTER — Encounter (HOSPITAL_COMMUNITY)
Admission: RE | Admit: 2015-02-06 | Discharge: 2015-02-06 | Disposition: A | Discharge status: Home / Self Care | Payer: Self-pay | Source: Ambulatory Visit | Attending: Cardiovascular Disease | Admitting: Cardiovascular Disease

## 2015-02-06 NOTE — Telephone Encounter (Signed)
Called patient and gave her information about scheduled time and location of her Carotid study idiopathic thrombocytopenic purpura is scheduled for Wednesday Feb24,2016 at 1:00 [place] at [employer].

## 2015-02-07 ENCOUNTER — Other Ambulatory Visit (HOSPITAL_COMMUNITY): Payer: Medicare Other

## 2015-02-07 ENCOUNTER — Ambulatory Visit
Admission: RE | Admit: 2015-02-07 | Discharge: 2015-02-07 | Disposition: A | Discharge status: Home / Self Care | Payer: Medicare Other | Source: Ambulatory Visit | Attending: Urology | Admitting: Urology

## 2015-02-07 DIAGNOSIS — N281 Cyst of kidney, acquired: Secondary | ICD-10-CM | POA: Diagnosis not present

## 2015-02-07 MED ORDER — GADOBENATE DIMEGLUMINE 529 MG/ML IV SOLN: 15.0000 mL | Freq: Once | INTRAVENOUS | Status: AC | PRN

## 2015-02-08 ENCOUNTER — Encounter (HOSPITAL_COMMUNITY): Admission: RE | Admit: 2015-02-08 | Payer: Self-pay | Source: Ambulatory Visit

## 2015-02-11 ENCOUNTER — Ambulatory Visit
Admission: RE | Admit: 2015-02-11 | Discharge: 2015-02-11 | Disposition: A | Discharge status: Home / Self Care | Payer: Medicare Other | Source: Ambulatory Visit | Attending: Internal Medicine | Admitting: Internal Medicine

## 2015-02-11 ENCOUNTER — Telehealth: Payer: Self-pay | Admitting: *Deleted

## 2015-02-11 ENCOUNTER — Encounter (HOSPITAL_COMMUNITY)
Admission: RE | Admit: 2015-02-11 | Discharge: 2015-02-11 | Disposition: A | Discharge status: Home / Self Care | Payer: Self-pay | Source: Ambulatory Visit | Attending: Cardiovascular Disease | Admitting: Cardiovascular Disease

## 2015-02-11 DIAGNOSIS — R2689 Other abnormalities of gait and mobility: Secondary | ICD-10-CM | POA: Diagnosis not present

## 2015-02-11 DIAGNOSIS — R51 Headache: Secondary | ICD-10-CM | POA: Diagnosis not present

## 2015-02-11 DIAGNOSIS — R251 Tremor, unspecified: Secondary | ICD-10-CM | POA: Diagnosis not present

## 2015-02-11 DIAGNOSIS — R413 Other amnesia: Secondary | ICD-10-CM

## 2015-02-11 MED ORDER — GADOBENATE DIMEGLUMINE 529 MG/ML IV SOLN
15.0000 mL | Freq: Once | INTRAVENOUS | Status: AC | PRN
  Administered 2015-02-11: 15 mL via INTRAVENOUS

## 2015-02-11 NOTE — Telephone Encounter (Signed)
reviewed MRI results with patient

## 2015-02-11 NOTE — Telephone Encounter (Signed)
Patient has an appt schedule with [facility] on 02/21/15 at 230

## 2015-02-13 ENCOUNTER — Ambulatory Visit (HOSPITAL_COMMUNITY)
Admission: RE | Admit: 2015-02-13 | Discharge: 2015-02-13 | Disposition: A | Discharge status: Home / Self Care | Payer: Medicare Other | Source: Ambulatory Visit | Attending: Internal Medicine | Admitting: Internal Medicine

## 2015-02-13 ENCOUNTER — Encounter (HOSPITAL_COMMUNITY): Payer: Self-pay

## 2015-02-13 DIAGNOSIS — R42 Dizziness and giddiness: Secondary | ICD-10-CM | POA: Insufficient documentation

## 2015-02-13 DIAGNOSIS — R413 Other amnesia: Secondary | ICD-10-CM

## 2015-02-13 NOTE — Progress Notes (Signed)
Bilateral carotid artery duplex completed:  1-39% ICA stenosis.  Vertebral artery flow is antegrade.     

## 2015-02-15 ENCOUNTER — Encounter (HOSPITAL_COMMUNITY)
Admission: RE | Admit: 2015-02-15 | Discharge: 2015-02-15 | Disposition: A | Discharge status: Home / Self Care | Payer: Self-pay | Source: Ambulatory Visit | Attending: Cardiovascular Disease | Admitting: Cardiovascular Disease

## 2015-02-15 DIAGNOSIS — N281 Cyst of kidney, acquired: Secondary | ICD-10-CM | POA: Diagnosis not present

## 2015-02-15 MED ORDER — DESVENLAFAXINE SUCCINATE ER 50 MG PO TB24: 50.0000 mg | ORAL_TABLET | Freq: Every day | ORAL | Status: DC

## 2015-02-15 NOTE — Progress Notes (Signed)
   Subjective:    Patient ID: Carrie Chung, female    DOB: [birth year], 74 y.o.   MRN: [id number]  HPI  74 year old White Female with history of coronary artery disease status post MI April 2013 status post stents of right coronary artery, left circumflex, LAD. She saw cardiologist in December. At that time she had had a recent fall. Since then she's had another fall. Patient denies excessive drinking of alcohol. We have talked at length about this. She does drink most every night. She's been depressed. Realizes there is some issues she's not dealt with. Would like to see counselor. She's been having a headache recently. Given history of falling, think we need to do neurological workup. She doesn't think antidepressant medication is working. She goes to cardiac rehabilitation for exercise. Feels like memory is getting worse.    Review of Systems     Objective:   Physical Exam  Funduscopic exam is benign. Disc are sharp and flat. Extraocular movements are full. No carotid bruits. Chest clear. Cardiac exam regular rate and rhythm. Brief neurological exam shows no focal deficits. She is alert and oriented 3.      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 Memory loss-check TSH, B-12, folate levels  Fatigue-add Pristiq 50 mg daily to Wellbutrin.  Insomnia-change Dalmane to Xanax as there is a shorter half-life  Plan: Patient is to have carotid Dopplers, is to have MRI of the brain with contrast. Consider counseling with [facility]. Watch alcohol consumption.  Addendum: TSH, B-12 and folate levels are normal.  35 minutes spent with patient with evaluation, examination, discussion of issues and recommendations  Addendum: MRI shows no tumor or stroke.  Carotid Doppler showed no significant stenosis.

## 2015-02-15 NOTE — Patient Instructions (Signed)
Add Pristiq to Wellbutrin. Try Xanax instead of now [place] for sleep. Consider counseling. Is to have MRI of brain and carotid Dopplers.

## 2015-02-17 NOTE — Progress Notes (Signed)
   Subjective:    Patient ID: Carrie Chung, female    DOB: 1941/12/10, 74 y.o.   MRN: [id number]  HPI   In late October she was feeling down and depressed again. We added Wellbutrin to Lexapro and she is here for follow-up. Feeling some better with more energy.    Review of Systems     Objective:   Physical Exam  Spent 15 minutes because with patient about depression, fatigue, lack of motivation      Assessment & Plan:  Depression  History of insomnia  Fatigue  Plan: Continue Lexapro and Wellbutrin.

## 2015-02-17 NOTE — Patient Instructions (Signed)
Continue Lexapro and Wellbutrin.

## 2015-02-18 ENCOUNTER — Encounter (HOSPITAL_COMMUNITY)
Admission: RE | Admit: 2015-02-18 | Discharge: 2015-02-18 | Disposition: A | Discharge status: Home / Self Care | Payer: Self-pay | Source: Ambulatory Visit | Attending: Cardiovascular Disease | Admitting: Cardiovascular Disease

## 2015-02-20 ENCOUNTER — Encounter (HOSPITAL_COMMUNITY)
Admission: RE | Admit: 2015-02-20 | Discharge: 2015-02-20 | Disposition: A | Discharge status: Home / Self Care | Payer: Self-pay | Source: Ambulatory Visit | Attending: Cardiovascular Disease | Admitting: Cardiovascular Disease

## 2015-02-20 DIAGNOSIS — E785 Hyperlipidemia, unspecified: Secondary | ICD-10-CM | POA: Insufficient documentation

## 2015-02-20 DIAGNOSIS — Z5189 Encounter for other specified aftercare: Secondary | ICD-10-CM | POA: Insufficient documentation

## 2015-02-20 DIAGNOSIS — I2510 Atherosclerotic heart disease of native coronary artery without angina pectoris: Secondary | ICD-10-CM | POA: Insufficient documentation

## 2015-02-20 DIAGNOSIS — I252 Old myocardial infarction: Secondary | ICD-10-CM | POA: Insufficient documentation

## 2015-02-20 DIAGNOSIS — I499 Cardiac arrhythmia, unspecified: Secondary | ICD-10-CM | POA: Insufficient documentation

## 2015-02-20 DIAGNOSIS — I10 Essential (primary) hypertension: Secondary | ICD-10-CM | POA: Insufficient documentation

## 2015-02-20 DIAGNOSIS — Z9861 Coronary angioplasty status: Secondary | ICD-10-CM | POA: Insufficient documentation

## 2015-02-21 ENCOUNTER — Ambulatory Visit: Payer: Medicare Other | Admitting: Neurology

## 2015-02-22 ENCOUNTER — Telehealth: Payer: Self-pay | Admitting: *Deleted

## 2015-02-22 DIAGNOSIS — N390 Urinary tract infection, site not specified: Secondary | ICD-10-CM

## 2015-02-22 MED ORDER — CIPROFLOXACIN HCL 500 MG PO TABS: 500.0000 mg | ORAL_TABLET | Freq: Two times a day (BID) | ORAL | Status: DC

## 2015-02-22 NOTE — Telephone Encounter (Signed)
Call in Cipro 500 mg bid x 7 days

## 2015-02-22 NOTE — Telephone Encounter (Signed)
Patient states she feels like she is getting a UTI she states she started using some AZO and it has helped she states she bought some of those test strips and it showed some trace leucocytes . She would like to know if you can call in some Cipro for her .

## 2015-02-27 ENCOUNTER — Encounter (HOSPITAL_COMMUNITY)
Admission: RE | Admit: 2015-02-27 | Discharge: 2015-02-27 | Disposition: A | Discharge status: Home / Self Care | Payer: Self-pay | Source: Ambulatory Visit | Attending: Cardiovascular Disease | Admitting: Cardiovascular Disease

## 2015-03-05 ENCOUNTER — Telehealth: Payer: Self-pay | Admitting: Internal Medicine

## 2015-03-05 NOTE — Telephone Encounter (Signed)
Patient was seen on 01/24/15 and was advised to consider counseling.  The counselor, Carrie Chung called this afternoon to advise that the patient was in her office at the time of the call.  Patient advised Carrie Chung that she had stopped Lexapro and Wellbutrin at the time of the 2/4 visit.  She started Pristiq 50mg  at that time.  Per Carrie Chung, she finds the patient to be still "depressed" as of today.  Carrie Chung asked that we contact the patient and clarify her medications with her.  Advised I would ask Dr. Renold Genta and call the patient.  Patient is scheduled to see a Neurologist in the a.m.  Carrie Chung wanted to make sure that the patient knew exactly what medications she was supposed to be on and what she wasn't supposed to be on.  Spoke with Dr. Renold Genta and she advised that patient was SUPPOSED to continue the Wellbutrin per her disposition.  HOWEVER, since the patient has STOPPED the Wellbutrin, just let it be.  Have the patient continue the Pristiq 50 mg daily and refer her to Dr. Sheralyn Boatman for depression.  Will send this note to Vinnie Level to refer to Dr. Caprice Beaver.

## 2015-03-06 ENCOUNTER — Encounter: Payer: Self-pay | Admitting: Neurology

## 2015-03-06 ENCOUNTER — Ambulatory Visit (INDEPENDENT_AMBULATORY_CARE_PROVIDER_SITE_OTHER): Payer: Medicare Other | Admitting: Neurology

## 2015-03-06 VITALS — BP 154/88 | HR 64 | Resp 14 | Ht 61.0 in | Wt 168.0 lb

## 2015-03-06 DIAGNOSIS — I2510 Atherosclerotic heart disease of native coronary artery without angina pectoris: Secondary | ICD-10-CM | POA: Diagnosis not present

## 2015-03-06 DIAGNOSIS — R063 Periodic breathing: Secondary | ICD-10-CM | POA: Diagnosis not present

## 2015-03-06 DIAGNOSIS — G3184 Mild cognitive impairment, so stated: Secondary | ICD-10-CM

## 2015-03-06 DIAGNOSIS — G4730 Sleep apnea, unspecified: Secondary | ICD-10-CM

## 2015-03-06 DIAGNOSIS — G4710 Hypersomnia, unspecified: Secondary | ICD-10-CM

## 2015-03-06 DIAGNOSIS — F418 Other specified anxiety disorders: Secondary | ICD-10-CM | POA: Diagnosis not present

## 2015-03-06 NOTE — Patient Instructions (Signed)
Mild Neurocognitive Disorder Mild neurocognitive disorder (formerly known as mild cognitive impairment) is a mental disorder. It is a slight abnormal decrease in mental function. The areas of mental function affected may include memory, thought, communication, behavior, and completion of tasks. The decrease is noticeable and measurable but for the most part does not interfere with your daily activities. Mild neurocognitive disorder typically occurs in people older than 60 years but can occur earlier. It is not as serious as major neurocognitive disorder (formerly known as dementia) but may lead to a more serious neurocognitive disorder. However, in some cases the condition does not get worse. A few people with this disorder even improve. CAUSES  There are a number of different causes of mild neurocognitive disorder:   Brain disorders associated with abnormal protein deposits, such as Alzheimer's disease, Pick's disease, and Lewy body disease.  Brain disorders associated with abnormal movement, such as Parkinson's disease and Huntington's disease.  Diseases affecting blood vessels in the brain and resulting in mini-strokes.  Certain infections, such as human immunodeficiency virus (HIV) infection.  Traumatic brain injury.  Other medical conditions such as brain tumors, underactive thyroid (hypothyroidism), and vitamin B12 deficiency.  Use of certain prescription medicine and "recreational" drugs. SYMPTOMS  Symptoms of mild neurocognitive disorder include:  Difficulty remembering. You may forget details of recent events, names, or phone numbers. You may forget important social events and appointments or repeatedly forget where you put your car keys.  Difficulty thinking and solving problems. You may have trouble with complex tasks such as paying bills or driving in unfamiliar locations.  Difficulty communicating. You may have trouble finding the right word, naming an object, forming a  sentence that makes sense, or understanding what you read or hear.  Changes in your behavior or personality. You may lose interest in the things that you used to enjoy or withdraw from social situations. You may get angry more easily than usual. You may act before thinking. You may do things in public that you would not usually do. You may hear or see things that are not real (hallucinations). You may believe falsely that others are trying to hurt you (paranoia). DIAGNOSIS Mild neurocognitive disorder is diagnosed through an assessment by your health care provider. Your health care provider will ask you and your family, friends, or coworkers questions about your symptoms. He or she will ask how often the symptoms occur, how long they have been occurring, whether they are getting worse, and the effect they are having on your life. Your health care provider may refer you to a neurologist or mental health specialist for a detailed evaluation of your mental functions (neuropsychological testing).  To identify the cause of your mild neurocognitive disorder, your health care provider may:  Obtain a detailed medical history.  Ask about alcohol and drug use, including prescription medicine.  Perform a physical exam.  Order blood tests and brain imaging exams. TREATMENT  Mild neurocognitive disorder caused by infections, use of certain medicines or "recreational" drugs, and certain medical conditions may improve with treatment of the condition that is causing the disorder. Mild neurocognitive disorder resulting from other causes generally does not improve and may worsen. In these cases, the goal of treatment is to slow progression of the disorder and help you cope with the loss of mental function. Treatments in these cases include:   Medicine. Medicine helps mainly with memory loss and behavioral symptoms.   Talk therapy. Talk therapy provides education, emotional support, memory aids, and other   include:   · Medicine. Medicine helps mainly with memory loss and behavioral symptoms.    · Talk therapy. Talk therapy provides education, emotional support, memory aids, and other ways of  making up for decreases in mental function.       · Lifestyle changes. These include regular exercise, a healthy diet (including essential omega-3 fatty acids), intellectual stimulation, and increased social interaction.  Document Released: 08/09/2013 Document Revised: 04/23/2014 Document Reviewed: 08/09/2013  [employer]® Patient Information ©2015 [employer]. This information is not intended to replace advice given to you by your health care provider. Make sure you discuss any questions you have with your health care provider.

## 2015-03-06 NOTE — Progress Notes (Addendum)
Provider:  Larey Seat, M D  Referring Provider: [name], MD Primary Care Physician:  [name], MD  Chief Complaint  Patient presents with  . NP  Carrie Chung Memory    Rm 11, alone    HPI:  Carrie Chung is a 74 y.o. brit[place] born female, seen here as a referral from Dr. Tommie Ard Carrie Chung for evaluation of her cognitive abilities, cognitive slowing.   Carrie Chung reports today that she has been feeling a little slower in her cognitive abilities , but that she is not nearly as concerned as her family doctor and her family. The patient states that she was always proud of her abilities to visually recall what her auditory processing memory was not as strong. She always kept nodes and exacting notes while in college and [employer]. Her visual spatial abilities were excellent. She feels that she has somewhat slowed she's not quite as good in these tasks she has always wondered if she is perhaps also less attentive. She has  not misplaced items, but her husband does. He has not gotten lost when driving, she still interested in reading books and different subject matter and she usually finds the page she ended up reading. She has never balance the checkbook. 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 Her mathematical skills have not suffered in any way.  She is a poor sleeper she reports with been. She rarely gets 6 hours of nocturnal sleep. At the sleep was very fragmented this has always been the case. She's not concerned about that also she has been reporting snoring, wheezing and coughing. Some swelling of the legs some chest pain muscle cramps death interest in activities, decreased memory decreased energy, anxiety, depression, restless legs. She has no history of shift work.  We concentrate today on medication and memory effects. Depression as well.        Review of Systems: Out of a complete 14 system review, the patient  complains of only the following symptoms, and all other reviewed systems are negative.  The patient got leg cramps with a previous cholesterol-lowering medication she switched to Zocor for and is now doing better, her Epworth sleepiness score is endorsed at 4 points 4-5 which is not elevated her fatigue severity score was not endorsed.  History   Social History  . Marital Status: Married    Spouse Name: N/A  . Number of Children: 1  . Years of Education: Coll +   Occupational History  . Retired Marine scientist    Social History Main Topics  . Smoking status: Former Smoker    Types: Cigarettes    Quit date: 04/19/1980  . Smokeless tobacco: Never Used  . Alcohol Use: 12.0 oz/week    20 Glasses of wine per week     Comment: socially  . Drug Use: No  . Sexual Activity: No   Other Topics Concern  . Not on file   Social History Narrative   Caffeine 2 cups tea.    Family History  Problem Relation Age of Onset  . Coronary artery disease Mother     CABG at age 68  . Cancer Father     larynx    Past Medical History  Diagnosis Date  . Hypokalemia   . HTN (hypertension)   . Sleep apnea   . Endometrial polyp   . CAD (coronary artery disease)     a. NSTEMI  03/2012 ([place] 03/27/12: pLAD 30%, mLAD 99%, mCFX 95%, mRCA 99% with thrombus, EF 65%);  s/p PTCA/DESx1 to prox-mid RCA 03/27/12 urgently in setting of hypotension/bradycardia, with staged PTCA/Evolve study stent to mid LAD & PTCA/Evolve study stent to prox LCx 03/29/12 ;   echo 03/28/12:EF 60%, Aortic sclerosis without AS, mild RVE, mild reduced RVSF    . Hyperglycemia   . Hyperlipidemia   . Atrial tachycardia     a. noted at cardiac rehab 5/13; event monitor ordered to assess for AFib  . MI, old     Past Surgical History  Procedure Laterality Date  . Cholecystectomy  1978  . Appendectomy  74 years old  . Tonsillectomy  74 years old  . Cesarean section  1984  . Carotid stent  04/2012    x3  . Left heart catheterization with coronary  angiogram N/A 03/27/2012    Procedure: LEFT HEART CATHETERIZATION WITH CORONARY ANGIOGRAM;  Surgeon: Burnell Blanks, MD;  Location: [facility] CATH LAB;  Service: Cardiovascular;  Laterality: N/A;  . Percutaneous coronary stent intervention (pci-s) N/A 03/27/2012    Procedure: PERCUTANEOUS CORONARY STENT INTERVENTION (PCI-S);  Surgeon: Burnell Blanks, MD;  Location: [facility] CATH LAB;  Service: Cardiovascular;  Laterality: N/A;  . Percutaneous coronary stent intervention (pci-s) N/A 03/29/2012    Procedure: PERCUTANEOUS CORONARY STENT INTERVENTION (PCI-S);  Surgeon: [name], MD;  Location: [facility] C[facility] LAB;  Service: Cardiovascular;  Laterality: N/A;    Current Outpatient Prescriptions  Medication Sig Dispense Refill  . ALPRAZolam (XANAX) 0.5 MG tablet Take 1 tablet (0.5 mg total) by mouth 2 (two) times daily as needed for anxiety or sleep. 60 tablet 0  . amLODipine (NORVASC) 5 MG tablet TAKE 1 TABLET (5 MG TOTAL) BY MOUTH DAILY. 30 tablet 6  . aspirin 81 MG tablet Take 1 tablet (81 mg total) by mouth daily.    . bacitracin ointment Apply topically daily. 120 g 0  . buPROPion (WELLBUTRIN XL) 150 MG 24 hr tablet Take 1 tablet (150 mg total) by mouth daily. 90 tablet 3  . Calcium Carbonate-Vitamin D (CALTRATE 600+D) 600-400 MG-UNIT per tablet Take 1 tablet by mouth 2 (two) times daily.     . ciprofloxacin (CIPRO) 500 MG tablet Take 1 tablet (500 mg total) by mouth 2 (two) times daily. 14 tablet 0  . clopidogrel (PLAVIX) 75 MG tablet Take 1 tablet (75 mg total) by mouth daily. 90 tablet 3  . CRESTOR 10 MG tablet TAKE 1 TABLET (10 MG TOTAL) BY MOUTH DAILY. 90 tablet 1  . desvenlafaxine (PRISTIQ) 50 MG 24 hr tablet Take 1 tablet (50 mg total) by mouth daily. 30 tablet 3  . esomeprazole (NEXIUM) 40 MG capsule Take 40 mg by mouth daily at 12 noon.    . folic acid-pyridoxine-cyancobalamin (FOLBIC) 2.5-25-2 MG TABS Take 1 tablet by mouth daily.    . hydrochlorothiazide (HYDRODIURIL) 25 MG tablet TAKE 1/2  TABLET BY MOUTH DAILY. 45 tablet 1  . HYDROcodone-acetaminophen (NORCO) 10-325 MG per tablet Take 1 tablet by mouth every 8 (eight) hours as needed for severe pain. 90 tablet 0  . metoprolol (LOPRESSOR) 50 MG tablet Take 50 mg by mouth 2 (two) times daily.    . nitroGLYCERIN (NITROSTAT) 0.4 MG SL tablet Place 1 tablet (0.4 mg total) under the tongue every 5 (five) minutes x 3 doses as needed for chest pain. 25 tablet 2   No current facility-administered medications for this visit.    Allergies as of 03/06/2015 - Review  Complete 03/06/2015  Allergen Reaction Noted  . Penicillins Hives and Swelling   . Sulfonamide derivatives Hives and Swelling   . Zinc oxide Rash     Vitals: BP 154/88 mmHg  Pulse 64  Resp 14  Ht _0  (1.549 m)  Wt 168 lb (76.204 kg)  BMI 31.76 kg/m2 Last Weight:  Wt Readings from Last 1 Encounters:  03/06/15 168 lb (76.204 kg)   Last Height:   Ht Readings from Last 1 Encounters:  03/06/15 _1  (1.549 m)    Physical exam:  General: The patient is awake, alert and appears not in acute distress. The patient is well groomed. Head: Normocephalic, atraumatic. Neck is supple. Mallampati 4, neck circumference:15 Cardiovascular:  Regular rate and rhythm without  murmurs or carotid bruit, and without distended neck veins. Respiratory: Lungs are clear to auscultation. Skin:  Without evidence of edema, or rash Trunk: BMI is  elevated and patient  has normal posture.  Neurologic exam : The patient is awake and alert, oriented to place and time.  Memory subjective  described as unimpaired . MOCA 28-30, the only 2 points Mrs. Pena missed on her Montral cognitive assessment test were to of the 5 recall words. A score of 28 out of 30 is considered normal. The performed the MMSE prior this 29 out of 30 points and here she missed 1 of the serial 7.  There is a normal attention span & concentration ability.  Speech is fluent without dysarthria, dysphonia or aphasia.  Mood and affect are appropriate.  Cranial nerves: Pupils are equal and briskly reactive to light. Funduscopic exam without  evidence of pallor or edema. Extraocular movements  in vertical and horizontal planes intact and without nystagmus.  Visual fields by finger perimetry are intact. Hearing to finger rub intact.  Facial sensation intact to fine touch. Facial motor strength is symmetric and tongue and uvula move midline. Tongue protrusion into either cheek is normal. Shoulder shrug is normal.   Motor exam: Normal tone, muscle bulk and symmetric strength in all extremities.  Sensory:  Fine touch, pinprick and vibration were tested in all extremities. Proprioception was normal.  Coordination: Rapid alternating movements in the fingers/hands were normal.  Finger-to-nose maneuver  normal without evidence of ataxia, dysmetria or tremor.  Gait and station: Patient walks without assistive device and is able unassisted to climb up to the exam table.  Strength within normal limits. Stance is stable and normal. Tandem gait is unfragmented. Romberg testing is negative   Deep tendon reflexes: in the  upper and lower extremities are symmetric and intact. Babinski maneuver downgoing.   Assessment:  After physical and neurologic examination, review of laboratory studies, imaging, neurophysiology testing and pre-existing records, 40 minute  assessment is that of :  COMPARISON: CT head 02/14/2013.  FINDINGS: No evidence for acute infarction, hemorrhage, mass lesion, hydrocephalus, or extra-axial fluid. Mild atrophy, not unexpected for age. Mild subcortical and periventricular T2 and FLAIR hyperintensities, likely chronic microvascular ischemic change. Pituitary, pineal, and cerebellar tonsils unremarkable. No upper cervical lesions. Flow voids are maintained throughout the carotid, basilar, and vertebral arteries. There are no areas of chronic hemorrhage.  Post infusion, no abnormal enhancement  of the brain or meninges. Visualized calvarium, skull base, and upper cervical osseous structures unremarkable. Scalp and extracranial soft tissues, orbits, sinuses, and mastoids show no acute process.  IMPRESSION: Mild chronic changes as described. No acute intracranial findings.   Electronically Signed  By: Rolla Flatten M.D.  On: 02/11/2015 15:08  Plan:  Treatment plan and additional workup :  1) continue Pristiq, keep away form wellbutrin for RLS and anxiety side effect.  2) OSA in CAD, PAD-  Sleep study SPLIT , AHI 20 and score at 4 % Medicare.  3) RLS intermittent , better since off Wellbutrin.   50% of our evaluation today I spent face-to-face discussing the patient's symptoms part of which have already improved. Visit change in her statin drug her muscle achiness soreness and cramps have improved with a change away from Wellbutrin she has less restless legs and only intermittently is bothered by those. She is still snoring she may still have apnea and she may have hypercapnia given that she has a history of coronary artery disease. I would like to order a split night polysomnography for her to see if apnea is there to a degree that may affect her memory or her overall function. I suspect that her main cognitive burden however is from at this time only partially treated depression.   Asencion Partridge Wandell Scullion MD 03/06/2015  PS Vitamin B-12 211 - 911 pg/mL >2000 (H)   Resulting Agency [facility]     Narrative     Performed at: [address], [address]        [place], [place] [address]       Specimen Collected: 01/25/15 9:14 AM Last Resulted: 01/26/15 12:53 AM                      Other Results from 01/25/2015         CBC with Differential  Status: EditedResult-FINAL Visible to patient:  MyChart Nextappt: 06/13/2015 at 08:45 AM in Cardiology [name], MD) Dx:  Other fatigue               Ref Range 73moago (01/25/15) 229mogo (12/14/14) 68m55moo (07/03/14) 632mo9mo (07/02/14)    WBC 4.0 - 10.5 K/uL 3.8 (L) 5.0 9.6 9.2    RBC 3.87 - 5.11 MIL/uL 4.06 3.96 4.04 4.39    Hemoglobin 12.0 - 15.0 g/dL 12.4 12.6 13.0 14.2    HCT 36.0 - 46.0 % 37.8 37.1 38.5 41.5    MCV 78.0 - 100.0 fL 93.1 93.7 95.3 94.5    MCH 26.0 - 34.0 pg 30.5 31.8 32.2 32.3    MCHC 30.0 - 36.0 g/dL 32.8 34.0 33.8 34.2    RDW 11.5 - 15.5 % 13.1 12.9 12.8 12.7    Platelets 150 - 400 K/uL 178 127 (L) 108 (L)CM 137 (L)    MPV 8.6 - 12.4 fL 10.1       Neutrophils Relative % 43 - 77 % 46 65  83 (H)    Neutro Abs 1.7 - 7.7 K/uL 1.7 3.2  7.6    Lymphocytes Relative 12 - 46 % 44 27  11 (L)    Lymphs Abs 0.7 - 4.0 K/uL 1.7 1.4  1.0    Monocytes Relative 3 - 12 % _0 Monocytes Absolute 0.1 - 1.0 K/uL 0.3 0.3  0.6    Eosinophils Relative 0 - 5 % 3 2  0    Eosinophils Absolute 0.0 - 0.7 K/uL 0.1 0.1  0.0    Basophils Relative 0 - 1 % 0 0  0    Basophils Absolute 0.0 - 0.1 K/uL 0.0 0.0  0.0    Smear Review  Criteria for review n...Marland KitchenMarland Kitchen  Criteria for review not met   Resulting Agency  [facility]    Narrative     Performed at: [address], [address]        [place], [place] [address]       Specimen Collected: 01/25/15 9:14 AM Last Resulted: 01/25/15 9:52 PM             CM=Additional comments         Lipid Panel  Status: EditedResult-FINAL Visible to patient:  MyChart Nextappt: 06/13/2015 at 08:45 AM in Cardiology [name], MD) Dx:  Coronary artery disease involving nat...           Ref Range 56moago  111mogo  1263moo  7yr35yr     Cholesterol 0 - 200 mg/dL 130 172 158CM 127CM   Comments: ATP III Classification:     < 200    mg/dL    Desirable    200 - 239   mg/dL    Borderline High    >= 240    mg/dL    High        Triglycerides  <150 mg/dL 110 116 139.0R, CM 138.0R, CM    HDL >39 mg/dL 61 39 mg/dL" class="rz_20" style="cursor: pointer;" onmouseover='jscript: var varStyle="underline"; var bgColor="#[id number]"; this.style.backgroundColor=bgColor; var children=this.getElementsByTagName("div"); for(var child=0;child 92 39.00 mg/dL" class="rz_20" style="cursor: pointer;" onmouseover='jscript: var varStyle="underline"; var bgColor="#E0E0E0"; this.style.backgroundColor=bgColor; var children=this.getElementsByTagName("div"); for(var child=0;child 71.10R 39.00 mg/dL" class="rz_21" style="cursor: pointer;" onmouseover='jscript: var varStyle="underline"; var bgColor="#[id number]"; this.style.backgroundColor=bgColor; var children=this.getElementsByTagName("div"); for(var child=0;child 57.30R    Total CHOL/HDL Ratio Ratio 2.1 1.9R 2R, CM 2R, CM    VLDL 0 - 40 mg/dL 22 23 27.8R 27.6R    LDL Cholesterol 0 - 99 mg/dL 47 57CM 59 42   Comments:   Total Cholesterol/HDL Ratio:CHD Risk             Coronary Heart Disease Risk Table                     Men    Women      1/2 Average Risk       3.4    3.3        Average Risk       5.0    4.4       2X Average Risk       9.6    7.1       3X Average Risk       23.4    11.0  Use the calculated Patient Ratio above and the CHD Risk table  to determine the patient's CHD Risk.  ATP III Classification (LDL):     < 100    mg/dL     Optimal    100 - 129   mg/dL     Near or Above Optimal    130 - 159   mg/dL     Borderline High    160 - 189   mg/dL     High     > 190    mg/dL     Very High       Resulting Agency  [facility]     Performed at: [address]    [place], [place] [id number]   Specimen Collected: 01/25/15 9:14 AM Last  Resulted: 01/25/15 10:28 PM             CM=Additional commentsR=Reference range differs from displayed range         TSH  Status: Finalresult Visible to patient:  MyChart Nextappt: 06/13/2015 at 08:45 AM in Cardiology [name], MD) Dx:  Other fatigue           Ref Range 44moago  134mogo  1y3yro  29yr81yr     TSH 0.350 - 4.500 uIU/mL 2.544 3.100CM 1.100 1.147 [facility]    Narrative     Performed at: [address]    [place], [place] [id number]   Specimen Collected: 01/25/15 9:14 AM Last Resulted: 01/26/15 12:53 AM             CM=Additional comments         Comprehensive metabolic panel  Status: EditedResult-FINAL Visible to patient:  MyChart Nextappt: 06/13/2015 at 08:45 AM in Cardiology (MicSherren Mocha) Dx:  Coronary artery disease involving nat...              Ref Range 36mo 84mo 5mo a34mo94mo ag35mo50mo ag66mo Sodium 135 - 145 mEq/L 141 138R, CM 141R 146R    Potassium 3.5 - 5.3 mEq/L 4.1 3.6R, CM 3.5 (L)R 3.4 (L)R    Chloride 96 - 112 mEq/L 99 99 96 101    CO2 19 - 32 mEq/L 36 (H) 29R 31 31    Glucose, Bld 70 - 99 mg/dL 88 98 100 (H) 114 (H)    BUN 6 - 23 mg/dL _0 Creat 0.50 - 1.10 mg/dL 0.82 0.82 0.69 0.71    Total Bilirubin 0.2 - 1.2 mg/dL 0.5   0.7R    Alkaline Phosphatase 39 - 117 U/L 71   72    AST 0 - 37 U/L 21   23    ALT 0 - 35 U/L 15   14    Total Protein 6.0 - 8.3 g/dL 6.6   7.1    Albumin 3.5 - 5.2 g/dL 4.3   3.9    Calcium 8.4 - 10.5 mg/dL 9.3 8.9 9.7 9.8   Resulting Agency  [facility]    Narrative     Performed at: [facility] [address]     [address], [place] [address]   [address]pecimen Collected: 01/25/15 9:14 AM Last Resulted: 01/25/15 10:28 PM               CM=Additional commentsR=Reference range differs from displayed range         Folate  Status: Finalresult Visible to patient:  MyChart Nextappt: 06/13/2015 at 08:45 AM in Cardiology (MichaelSherren Mocha:  Other fatigue; Memory loss; Coronary ...         Ref Range 36mo ago 336moolate ng/mL >20.0   Comments:   Reference Ranges      Deficient:    0.4 - 3.3 ng/mL      Indeterminate:  3.4 - 5.4 ng/mL      Normal:       > 5.4 ng/mL       Resulting Agency [facility]     Narrative  Performed at: [address]        [place], [place] [address]       Specimen Collected: 01/25/15 9:14 AM Last Resulted: 01/26/15 12:53 AM                         Reviewed by List     [name], MD on 01/26/2015 5:51 PM     Encounter     View Encounter      Result Information      Status    Abnormal Final result (01/26/2015 12:53 AM)    Provider Status: Reviewed        Lab Information     [facility]          Order-Level Documents:     There are no order-level documents.     View SmartLink Info     VITAMIN B12 (Order #[id number]) on 01/25/15    Vitamin B12 (Order [id number])  Lab  Order: [id number]   Date: 01/25/2015  Department: [facility], MD  Ordering/Authorizing: [name], MD       Order Information     Order Date/Time Release Date/Time Start Date/Time End Date/Time    01/25/15 09:12 AM None 01/25/2015 None      Order Details     Frequency Duration Priority Order Class    None None Routine Clinic Collect      Acc#    [id number]      Associated Diagnoses       ICD-9-CM ICD-10-CM    Other fatigue - Primary    780.79 R53.83

## 2015-03-08 ENCOUNTER — Encounter (HOSPITAL_COMMUNITY)
Admission: RE | Admit: 2015-03-08 | Discharge: 2015-03-08 | Disposition: A | Discharge status: Home / Self Care | Payer: Self-pay | Source: Ambulatory Visit | Attending: Cardiovascular Disease | Admitting: Cardiovascular Disease

## 2015-03-11 ENCOUNTER — Other Ambulatory Visit: Payer: Self-pay | Admitting: Cardiovascular Disease

## 2015-03-11 ENCOUNTER — Encounter (HOSPITAL_COMMUNITY)
Admission: RE | Admit: 2015-03-11 | Discharge: 2015-03-11 | Disposition: A | Discharge status: Home / Self Care | Payer: Self-pay | Source: Ambulatory Visit | Attending: Cardiovascular Disease | Admitting: Cardiovascular Disease

## 2015-03-12 ENCOUNTER — Telehealth: Payer: Self-pay | Admitting: *Deleted

## 2015-03-12 NOTE — Telephone Encounter (Signed)
Dr Caprice Beaver does not see medicare patients will have to find alternative options

## 2015-03-13 ENCOUNTER — Encounter (HOSPITAL_COMMUNITY)
Admission: RE | Admit: 2015-03-13 | Discharge: 2015-03-13 | Disposition: A | Discharge status: Home / Self Care | Payer: Self-pay | Source: Ambulatory Visit | Attending: Cardiovascular Disease | Admitting: Cardiovascular Disease

## 2015-03-14 NOTE — Telephone Encounter (Signed)
Patient given number to schedule appt with [facility] will follow up with patient to see if she scheduled

## 2015-03-18 ENCOUNTER — Encounter (HOSPITAL_COMMUNITY)
Admission: RE | Admit: 2015-03-18 | Discharge: 2015-03-18 | Disposition: A | Discharge status: Home / Self Care | Payer: Self-pay | Source: Ambulatory Visit | Attending: Cardiovascular Disease | Admitting: Cardiovascular Disease

## 2015-03-19 NOTE — Telephone Encounter (Signed)
Patient is getting a sleep study on April 11 and would like to wait till after that to see a psychiatrist until after that. She says she wants to do want to do one thing at a time.She states she might just pay out of pocket to see Dr Caprice Beaver. She will keep [place] updated. She states she is just taking Pristiq at this time and states she thinks she is doing better.

## 2015-03-20 ENCOUNTER — Other Ambulatory Visit: Payer: Self-pay | Admitting: Internal Medicine

## 2015-03-20 ENCOUNTER — Encounter (HOSPITAL_COMMUNITY)
Admission: RE | Admit: 2015-03-20 | Discharge: 2015-03-20 | Disposition: A | Discharge status: Home / Self Care | Payer: Self-pay | Source: Ambulatory Visit | Attending: Cardiovascular Disease | Admitting: Cardiovascular Disease

## 2015-03-22 ENCOUNTER — Encounter (HOSPITAL_COMMUNITY): Payer: Medicare Other

## 2015-03-22 DIAGNOSIS — I499 Cardiac arrhythmia, unspecified: Secondary | ICD-10-CM | POA: Insufficient documentation

## 2015-03-22 DIAGNOSIS — Z9861 Coronary angioplasty status: Secondary | ICD-10-CM | POA: Insufficient documentation

## 2015-03-22 DIAGNOSIS — I10 Essential (primary) hypertension: Secondary | ICD-10-CM | POA: Insufficient documentation

## 2015-03-22 DIAGNOSIS — Z5189 Encounter for other specified aftercare: Secondary | ICD-10-CM | POA: Insufficient documentation

## 2015-03-22 DIAGNOSIS — I2510 Atherosclerotic heart disease of native coronary artery without angina pectoris: Secondary | ICD-10-CM | POA: Insufficient documentation

## 2015-03-22 DIAGNOSIS — E785 Hyperlipidemia, unspecified: Secondary | ICD-10-CM | POA: Insufficient documentation

## 2015-03-22 DIAGNOSIS — I252 Old myocardial infarction: Secondary | ICD-10-CM | POA: Insufficient documentation

## 2015-03-23 ENCOUNTER — Other Ambulatory Visit: Payer: Self-pay | Admitting: Internal Medicine

## 2015-03-25 ENCOUNTER — Encounter (HOSPITAL_COMMUNITY)
Admission: RE | Admit: 2015-03-25 | Discharge: 2015-03-25 | Disposition: A | Discharge status: Home / Self Care | Payer: Self-pay | Source: Ambulatory Visit | Attending: Cardiovascular Disease | Admitting: Cardiovascular Disease

## 2015-03-27 ENCOUNTER — Encounter (HOSPITAL_COMMUNITY)
Admission: RE | Admit: 2015-03-27 | Discharge: 2015-03-27 | Disposition: A | Discharge status: Home / Self Care | Payer: Self-pay | Source: Ambulatory Visit | Attending: Cardiovascular Disease | Admitting: Cardiovascular Disease

## 2015-03-29 ENCOUNTER — Other Ambulatory Visit: Payer: Self-pay | Admitting: Internal Medicine

## 2015-03-29 ENCOUNTER — Encounter (HOSPITAL_COMMUNITY)
Admission: RE | Admit: 2015-03-29 | Discharge: 2015-03-29 | Disposition: A | Discharge status: Home / Self Care | Payer: Self-pay | Source: Ambulatory Visit | Attending: Cardiovascular Disease | Admitting: Cardiovascular Disease

## 2015-04-01 ENCOUNTER — Encounter (HOSPITAL_COMMUNITY)
Admission: RE | Admit: 2015-04-01 | Discharge: 2015-04-01 | Disposition: A | Discharge status: Home / Self Care | Payer: Self-pay | Source: Ambulatory Visit | Attending: Cardiovascular Disease | Admitting: Cardiovascular Disease

## 2015-04-01 ENCOUNTER — Other Ambulatory Visit: Payer: Self-pay | Admitting: Internal Medicine

## 2015-04-03 ENCOUNTER — Other Ambulatory Visit: Payer: Self-pay | Admitting: Internal Medicine

## 2015-04-03 ENCOUNTER — Encounter (HOSPITAL_COMMUNITY)
Admission: RE | Admit: 2015-04-03 | Discharge: 2015-04-03 | Disposition: A | Discharge status: Home / Self Care | Payer: Self-pay | Source: Ambulatory Visit | Attending: Cardiovascular Disease | Admitting: Cardiovascular Disease

## 2015-04-05 ENCOUNTER — Encounter (HOSPITAL_COMMUNITY)
Admission: RE | Admit: 2015-04-05 | Discharge: 2015-04-05 | Disposition: A | Discharge status: Home / Self Care | Payer: Self-pay | Source: Ambulatory Visit | Attending: Cardiovascular Disease | Admitting: Cardiovascular Disease

## 2015-04-08 ENCOUNTER — Encounter (HOSPITAL_COMMUNITY): Payer: Self-pay

## 2015-04-10 ENCOUNTER — Encounter (HOSPITAL_COMMUNITY)
Admission: RE | Admit: 2015-04-10 | Discharge: 2015-04-10 | Disposition: A | Discharge status: Home / Self Care | Payer: Self-pay | Source: Ambulatory Visit | Attending: Cardiovascular Disease | Admitting: Cardiovascular Disease

## 2015-04-12 ENCOUNTER — Encounter (HOSPITAL_COMMUNITY)
Admission: RE | Admit: 2015-04-12 | Discharge: 2015-04-12 | Disposition: A | Discharge status: Home / Self Care | Payer: Self-pay | Source: Ambulatory Visit | Attending: Cardiovascular Disease | Admitting: Cardiovascular Disease

## 2015-04-15 ENCOUNTER — Other Ambulatory Visit: Payer: Self-pay | Admitting: Cardiovascular Disease

## 2015-04-15 ENCOUNTER — Encounter (HOSPITAL_COMMUNITY)
Admission: RE | Admit: 2015-04-15 | Discharge: 2015-04-15 | Disposition: A | Discharge status: Home / Self Care | Payer: Self-pay | Source: Ambulatory Visit | Attending: Cardiovascular Disease | Admitting: Cardiovascular Disease

## 2015-04-17 ENCOUNTER — Encounter (HOSPITAL_COMMUNITY): Payer: Self-pay

## 2015-04-17 DIAGNOSIS — Z803 Family history of malignant neoplasm of breast: Secondary | ICD-10-CM | POA: Diagnosis not present

## 2015-04-17 DIAGNOSIS — Z1231 Encounter for screening mammogram for malignant neoplasm of breast: Secondary | ICD-10-CM | POA: Diagnosis not present

## 2015-04-19 ENCOUNTER — Encounter (HOSPITAL_COMMUNITY)
Admission: RE | Admit: 2015-04-19 | Discharge: 2015-04-19 | Disposition: A | Discharge status: Home / Self Care | Payer: Self-pay | Source: Ambulatory Visit | Attending: Cardiovascular Disease | Admitting: Cardiovascular Disease

## 2015-04-22 ENCOUNTER — Encounter (HOSPITAL_COMMUNITY)
Admission: RE | Admit: 2015-04-22 | Discharge: 2015-04-22 | Disposition: A | Discharge status: Home / Self Care | Payer: Self-pay | Source: Ambulatory Visit | Attending: Cardiovascular Disease | Admitting: Cardiovascular Disease

## 2015-04-22 DIAGNOSIS — I499 Cardiac arrhythmia, unspecified: Secondary | ICD-10-CM | POA: Insufficient documentation

## 2015-04-22 DIAGNOSIS — Z9861 Coronary angioplasty status: Secondary | ICD-10-CM | POA: Insufficient documentation

## 2015-04-22 DIAGNOSIS — I10 Essential (primary) hypertension: Secondary | ICD-10-CM | POA: Insufficient documentation

## 2015-04-22 DIAGNOSIS — I2510 Atherosclerotic heart disease of native coronary artery without angina pectoris: Secondary | ICD-10-CM | POA: Insufficient documentation

## 2015-04-22 DIAGNOSIS — E785 Hyperlipidemia, unspecified: Secondary | ICD-10-CM | POA: Insufficient documentation

## 2015-04-22 DIAGNOSIS — I252 Old myocardial infarction: Secondary | ICD-10-CM | POA: Insufficient documentation

## 2015-04-22 DIAGNOSIS — Z5189 Encounter for other specified aftercare: Secondary | ICD-10-CM | POA: Insufficient documentation

## 2015-04-24 ENCOUNTER — Encounter (HOSPITAL_COMMUNITY)
Admission: RE | Admit: 2015-04-24 | Discharge: 2015-04-24 | Disposition: A | Discharge status: Home / Self Care | Payer: Self-pay | Source: Ambulatory Visit | Attending: Cardiovascular Disease | Admitting: Cardiovascular Disease

## 2015-04-24 ENCOUNTER — Ambulatory Visit (INDEPENDENT_AMBULATORY_CARE_PROVIDER_SITE_OTHER): Payer: Medicare Other | Admitting: Neurology

## 2015-04-24 DIAGNOSIS — G3184 Mild cognitive impairment, so stated: Secondary | ICD-10-CM

## 2015-04-24 DIAGNOSIS — F418 Other specified anxiety disorders: Secondary | ICD-10-CM

## 2015-04-24 DIAGNOSIS — G4730 Sleep apnea, unspecified: Secondary | ICD-10-CM | POA: Diagnosis not present

## 2015-04-24 DIAGNOSIS — G4710 Hypersomnia, unspecified: Secondary | ICD-10-CM

## 2015-04-24 DIAGNOSIS — R063 Periodic breathing: Secondary | ICD-10-CM

## 2015-04-25 NOTE — Sleep Study (Signed)
Please see the scanned sleep study interpretation located in the Procedure tab within the Chart Review section. 

## 2015-04-26 ENCOUNTER — Encounter (HOSPITAL_COMMUNITY): Payer: Self-pay

## 2015-04-29 ENCOUNTER — Encounter (HOSPITAL_COMMUNITY)
Admission: RE | Admit: 2015-04-29 | Discharge: 2015-04-29 | Disposition: A | Discharge status: Home / Self Care | Payer: Self-pay | Source: Ambulatory Visit | Attending: Cardiovascular Disease | Admitting: Cardiovascular Disease

## 2015-05-01 ENCOUNTER — Encounter (HOSPITAL_COMMUNITY): Payer: Self-pay

## 2015-05-03 ENCOUNTER — Encounter (HOSPITAL_COMMUNITY)
Admission: RE | Admit: 2015-05-03 | Discharge: 2015-05-03 | Disposition: A | Discharge status: Home / Self Care | Payer: Self-pay | Source: Ambulatory Visit | Attending: Cardiovascular Disease | Admitting: Cardiovascular Disease

## 2015-05-06 ENCOUNTER — Encounter (HOSPITAL_COMMUNITY)
Admission: RE | Admit: 2015-05-06 | Discharge: 2015-05-06 | Disposition: A | Discharge status: Home / Self Care | Payer: Self-pay | Source: Ambulatory Visit | Attending: Cardiovascular Disease | Admitting: Cardiovascular Disease

## 2015-05-08 ENCOUNTER — Encounter (HOSPITAL_COMMUNITY)
Admission: RE | Admit: 2015-05-08 | Discharge: 2015-05-08 | Disposition: A | Discharge status: Home / Self Care | Payer: Self-pay | Source: Ambulatory Visit | Attending: Cardiovascular Disease | Admitting: Cardiovascular Disease

## 2015-05-10 ENCOUNTER — Encounter (HOSPITAL_COMMUNITY): Payer: Self-pay

## 2015-05-13 ENCOUNTER — Telehealth: Payer: Self-pay

## 2015-05-13 ENCOUNTER — Encounter (HOSPITAL_COMMUNITY)
Admission: RE | Admit: 2015-05-13 | Discharge: 2015-05-13 | Disposition: A | Discharge status: Home / Self Care | Payer: Self-pay | Source: Ambulatory Visit | Attending: Cardiovascular Disease | Admitting: Cardiovascular Disease

## 2015-05-13 NOTE — Telephone Encounter (Signed)
Called pt re: her sleep study results. Informed pt that the study revealed hypoxemia, unrelated to apnea. Encouraged her to avoid substances that may worsen sleep breathing such as benzos, muscle relaxants and opiates, alcohol and tobacco. Recommended seeing her PCP/pulmonologist because of the coughing and suspected asthma/copd, and oxygen therapy at night may help. Pt said she already has an appt with Dr. Brett Fairy in June and she will also follow up with her PCP. Pt says she was congested and coughing the day of the study. Encouraged pt to call back with any questions or concerns.

## 2015-05-15 ENCOUNTER — Encounter (HOSPITAL_COMMUNITY)
Admission: RE | Admit: 2015-05-15 | Discharge: 2015-05-15 | Disposition: A | Discharge status: Home / Self Care | Payer: Self-pay | Source: Ambulatory Visit | Attending: Cardiovascular Disease | Admitting: Cardiovascular Disease

## 2015-05-17 ENCOUNTER — Encounter (HOSPITAL_COMMUNITY)
Admission: RE | Admit: 2015-05-17 | Discharge: 2015-05-17 | Disposition: A | Discharge status: Home / Self Care | Payer: Self-pay | Source: Ambulatory Visit | Attending: Cardiovascular Disease | Admitting: Cardiovascular Disease

## 2015-05-22 ENCOUNTER — Encounter (HOSPITAL_COMMUNITY)
Admission: RE | Admit: 2015-05-22 | Discharge: 2015-05-22 | Disposition: A | Discharge status: Home / Self Care | Payer: Self-pay | Source: Ambulatory Visit | Attending: Cardiovascular Disease | Admitting: Cardiovascular Disease

## 2015-05-22 DIAGNOSIS — I252 Old myocardial infarction: Secondary | ICD-10-CM | POA: Insufficient documentation

## 2015-05-22 DIAGNOSIS — I2510 Atherosclerotic heart disease of native coronary artery without angina pectoris: Secondary | ICD-10-CM | POA: Insufficient documentation

## 2015-05-22 DIAGNOSIS — I499 Cardiac arrhythmia, unspecified: Secondary | ICD-10-CM | POA: Insufficient documentation

## 2015-05-22 DIAGNOSIS — I10 Essential (primary) hypertension: Secondary | ICD-10-CM | POA: Insufficient documentation

## 2015-05-22 DIAGNOSIS — Z9861 Coronary angioplasty status: Secondary | ICD-10-CM | POA: Insufficient documentation

## 2015-05-22 DIAGNOSIS — Z5189 Encounter for other specified aftercare: Secondary | ICD-10-CM | POA: Insufficient documentation

## 2015-05-22 DIAGNOSIS — E785 Hyperlipidemia, unspecified: Secondary | ICD-10-CM | POA: Insufficient documentation

## 2015-05-23 ENCOUNTER — Telehealth: Payer: Self-pay | Admitting: *Deleted

## 2015-05-23 ENCOUNTER — Ambulatory Visit: Payer: Medicare Other | Admitting: Internal Medicine

## 2015-05-23 DIAGNOSIS — R0902 Hypoxemia: Secondary | ICD-10-CM

## 2015-05-24 ENCOUNTER — Encounter (HOSPITAL_COMMUNITY): Payer: Self-pay

## 2015-05-27 ENCOUNTER — Encounter (HOSPITAL_COMMUNITY): Payer: Self-pay

## 2015-05-27 ENCOUNTER — Ambulatory Visit (INDEPENDENT_AMBULATORY_CARE_PROVIDER_SITE_OTHER): Payer: Medicare Other | Admitting: Internal Medicine

## 2015-05-27 ENCOUNTER — Encounter: Payer: Self-pay | Admitting: Internal Medicine

## 2015-05-27 VITALS — BP 152/88 | HR 74 | Temp 98.4°F | Wt 168.0 lb

## 2015-05-27 DIAGNOSIS — R05 Cough: Secondary | ICD-10-CM

## 2015-05-27 DIAGNOSIS — F329 Major depressive disorder, single episode, unspecified: Secondary | ICD-10-CM

## 2015-05-27 DIAGNOSIS — G4700 Insomnia, unspecified: Secondary | ICD-10-CM | POA: Diagnosis not present

## 2015-05-27 DIAGNOSIS — G2581 Restless legs syndrome: Secondary | ICD-10-CM

## 2015-05-27 DIAGNOSIS — I252 Old myocardial infarction: Secondary | ICD-10-CM | POA: Diagnosis not present

## 2015-05-27 DIAGNOSIS — G4762 Sleep related leg cramps: Secondary | ICD-10-CM | POA: Diagnosis not present

## 2015-05-27 DIAGNOSIS — F32A Depression, unspecified: Secondary | ICD-10-CM

## 2015-05-27 DIAGNOSIS — I2510 Atherosclerotic heart disease of native coronary artery without angina pectoris: Secondary | ICD-10-CM | POA: Diagnosis not present

## 2015-05-27 DIAGNOSIS — R059 Cough, unspecified: Secondary | ICD-10-CM

## 2015-05-27 DIAGNOSIS — G4734 Idiopathic sleep related nonobstructive alveolar hypoventilation: Secondary | ICD-10-CM

## 2015-05-27 MED ORDER — ALBUTEROL SULFATE HFA 108 (90 BASE) MCG/ACT IN AERS: 2.0000 | INHALATION_SPRAY | Freq: Four times a day (QID) | RESPIRATORY_TRACT | Status: DC | PRN

## 2015-05-27 MED ORDER — DOXYCYCLINE HYCLATE 100 MG PO TABS: 100.0000 mg | ORAL_TABLET | Freq: Two times a day (BID) | ORAL | Status: DC

## 2015-05-27 NOTE — Progress Notes (Signed)
   Subjective:    Patient ID: Carrie Chung, female    DOB: November 21, 1941, 74 y.o.   MRN: [id number]  HPI  Patient has been seeing Dr. Brett Fairy recently for some neurocognitive issues. Sleep study was done recently showing some hypoxemia. At the time patient was complaining of some respiratory congestion. Says she's had respiratory congestion ongoing for several months with cough.  She is only taking Xanax for sleep. Also, is on beta blocker per cardiologist. Not on ACE inhibitor.  Wakes up about every 2 hours. Sleep has  been an issue previously. Continues to see Dennison Nancy for counseling and likes her. Occasionally takes hydrocodone/APAP for pain. Says cough started several months ago and has persisted. Some white sputum production. Is enrolled in cardiac rehabilitation status post MI.  Her daughter's expecting a boy and she's quite excited about this.  Sleep study done by Dr. Brett Fairy showed the average heart rate be 64 beats permit and in sinus rhythm. Highest heart rate was 74 beats per minute in sinus rhythm. Report says she had a physiologically normal degree of obstructive sleep apnea. Overall AHI was 4.5 per hour. She was found to have nocturnal hypoxemia. The lowest O2 sat was 84% with prolonged desaturation over 100 minutes. Snoring was noted but not associated with arousals primarily. There were frequent rhythmic leg movements before sleep onset. She has a history of restless leg syndrome and leg cramps. Have recommended magnesium supplement for leg cramps. Has been told to avoid benzodiazepine small sore relaxants and opiates as well as alcohol. Neurologist recommended pulmonology consultation regarding possible asthma. Patient may need night oxygen therapy.  She admits to some occasional wheezing. Has never been tested for asthma. Doesn't have an inhaler.     Review of Systems     Objective:   Physical Exam   Skin warm and dry. Nodes none. Chest clear to auscultation without  rales or wheezing. Cardiac exam regular rate and rhythmS      Assessment & Plan:  Hypoxemia and prolonged desaturation  on recent sleep study. Patient is now just on Xanax at night for sleep.  Cough and congestion-needs to be tested for asthma with spirometry  Depression-stable with counseling with Dennison Nancy and Pristiq. Takes Xanax night for sleep.  History of MI-followed by cardiology  Restless leg syndrome-treated by a neurologist  Nocturnal leg cramps-recommend magnesium supplement over-the-counter  Plan: Trial of Proventil inhaler. doxycycline 100 mg twice daily for 10 days. Pulmonology appointment.  25 minutes spent with patient discussing these issues.

## 2015-05-28 NOTE — Patient Instructions (Signed)
Appointment with pulmonary for asthma evaluation. Continue same medications. Take magnesium supplement for leg cramps.

## 2015-05-29 ENCOUNTER — Encounter (HOSPITAL_COMMUNITY)
Admission: RE | Admit: 2015-05-29 | Discharge: 2015-05-29 | Disposition: A | Discharge status: Home / Self Care | Payer: Self-pay | Source: Ambulatory Visit | Attending: Cardiovascular Disease | Admitting: Cardiovascular Disease

## 2015-05-29 ENCOUNTER — Other Ambulatory Visit: Payer: Self-pay | Admitting: Internal Medicine

## 2015-05-29 NOTE — Telephone Encounter (Signed)
Xanax refilled.  

## 2015-05-29 NOTE — Telephone Encounter (Signed)
Patient scheduled to see Dr Elsworth Soho June 15,2016 @ 2pm patient to arrive at 1:45pm . Patient notified and given appt information

## 2015-05-30 ENCOUNTER — Other Ambulatory Visit: Payer: Self-pay | Admitting: Cardiovascular Disease

## 2015-05-31 ENCOUNTER — Other Ambulatory Visit: Payer: Self-pay | Admitting: *Deleted

## 2015-05-31 ENCOUNTER — Encounter (HOSPITAL_COMMUNITY)
Admission: RE | Admit: 2015-05-31 | Discharge: 2015-05-31 | Disposition: A | Discharge status: Home / Self Care | Payer: Self-pay | Source: Ambulatory Visit | Attending: Cardiovascular Disease | Admitting: Cardiovascular Disease

## 2015-05-31 MED ORDER — FLUCONAZOLE 150 MG PO TABS: 150.0000 mg | ORAL_TABLET | Freq: Once | ORAL | Status: DC

## 2015-05-31 NOTE — Telephone Encounter (Signed)
Patient called states she didn't get her script for Diflucan . Sent it in to patient pharmacy

## 2015-06-03 ENCOUNTER — Encounter (HOSPITAL_COMMUNITY)
Admission: RE | Admit: 2015-06-03 | Discharge: 2015-06-03 | Disposition: A | Discharge status: Home / Self Care | Payer: Self-pay | Source: Ambulatory Visit | Attending: Cardiovascular Disease | Admitting: Cardiovascular Disease

## 2015-06-04 ENCOUNTER — Encounter: Payer: Self-pay | Admitting: Neurology

## 2015-06-04 ENCOUNTER — Ambulatory Visit (INDEPENDENT_AMBULATORY_CARE_PROVIDER_SITE_OTHER): Payer: Medicare Other | Admitting: Neurology

## 2015-06-04 VITALS — BP 162/80 | HR 80 | Resp 20 | Ht 62.21 in | Wt 167.0 lb

## 2015-06-04 DIAGNOSIS — F32A Depression, unspecified: Secondary | ICD-10-CM

## 2015-06-04 DIAGNOSIS — F329 Major depressive disorder, single episode, unspecified: Secondary | ICD-10-CM | POA: Diagnosis not present

## 2015-06-04 DIAGNOSIS — R0902 Hypoxemia: Secondary | ICD-10-CM

## 2015-06-04 DIAGNOSIS — I2510 Atherosclerotic heart disease of native coronary artery without angina pectoris: Secondary | ICD-10-CM

## 2015-06-04 NOTE — Progress Notes (Signed)
Sleep medicine clinic   Provider:  Larey Seat, M D  Referring Provider: [name], MD Primary Care Physician:  [name], MD  Chief Complaint  Patient presents with  . Follow-up    sleep study, rm 11, alone    HPI:  Carrie Chung is a 74 y.o. [place] born female, seen here as a referral from Dr. Tommie Ard Baxley for evaluation of her cognitive abilities, cognitive slowing.   Carrie Chung reports today that she has been feeling a little slower in her cognitive abilities , but that she is not nearly as concerned as her family doctor and her family. The patient states that she was always proud of her abilities to visually recall what her auditory processing memory was not as strong. She always kept nodes and exacting notes while in college and [employer]. Her visual spatial abilities were excellent. She feels that she has somewhat slowed she's not quite as good in these tasks she has always wondered if she is perhaps also less attentive. She has  not misplaced items, but her husband does. He has not gotten lost when driving, she still interested in reading books and different subject matter and she usually finds the page she ended up reading. She has never balance the checkbook. 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 Her mathematical skills have not suffered in any way.  She is a poor sleeper she reports with been. She rarely gets 6 hours of nocturnal sleep. At the sleep was very fragmented this has always been the case. She's not concerned about that also she has been reporting snoring, wheezing and coughing. Some swelling of the legs some chest pain muscle cramps death interest in activities, decreased memory decreased energy, anxiety, depression, restless legs. She has no history of shift work.  We concentrate today on medication and memory effects. Depression as well. 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 Since then she is followed by Dr. Burt Knack in regular intervals.  06-04-15; Carrie Chung is here to follow-up on her recent sleep study performed on 04-24-15. The patient chief complaint was cognitive decline but she also presented for sleep complaints of snoring, wheezing coughing at night having restless legs and feeling constantly fatigued. She was not excessively daytime sleepy as much as fatigue. The sleep study revealed an AHI of 4.5 and a respiratory disturbance index of 7.6 this is not considered significant apnea. Only during REM sleep did she have an AHI of 11.5 , which would still be considered mild supine sleep also exenterated her apnea index. She did have desaturations and a total time of 106 minutes of oxygen levels at or below 90% was measured. There were no significant periodic limb movements or spontaneous arousal index was the highest at 9.4. Toxemia seems to be the key to elevate her nadir may give her a lot more energy during daytime. As she has associated coronary artery disease as a core morbidity and she is also using muscle relaxants and sometimes opiates she may have even less of a breathing drive.  Suspected asthma or COPD per PCP note .  Oxygen will improve the situation, but  oxygen therapy usually starts at 2 L/m. She has morning headaches without CO2 retention. - hypoxemia?   From the perspective of the sleep clinic here I cannot order oxygen therapy without associated CPAP use. However I don't think  that this patient needs  CPAP. I would like for her to speak to the  pulmonologist to  [facility] for oxygen use at home. She will see Dr. Elsworth Soho my pulmonology colleague were also read sleep studies tomorrow.   Review of Systems: Out of a complete 14 system review, the patient complains of only the following symptoms, and all other reviewed systems are negative.   06-04-15 . In addition today's  PHQ9 was endorsed at 18 points, her  Epworth sleepiness score was endorsed at 8 points and her fatigue severity score was endorsed at 46. MOCA at 28-30 , all points lost were in the delayed recall of words. This is the most common form of short-term memory impairment. It is not enough to diagnose an early dementia it is at this stage not even enough to diagnose a mild cognitive impairment. 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 The patient's depression has been treated successfully this medication but she also claims that she noticed how her emotional bend with has been more narrowed she is neither getting excited nor does she get very sad. This is a desired result!    The patient got leg cramps with a previous cholesterol-lowering medication she switched to Zocor for and is now doing better,   History   Social History  . Marital Status: Married    Spouse Name: N/A  . Number of Children: 1  . Years of Education: Coll +   Occupational History  . Retired Marine scientist    Social History Main Topics  . Smoking status: Former Smoker    Types: Cigarettes    Quit date: 04/19/1980  . Smokeless tobacco: Never Used  . Alcohol Use: 12.0 oz/week    20 Glasses of wine per week     Comment: socially  . Drug Use: No  . Sexual Activity: No   Other Topics Concern  . Not on file   Social History Narrative   Caffeine 2 cups tea.    Family History  Problem Relation Age of Onset  . Coronary artery disease Mother     CABG at age 5  . Cancer Father     larynx    Past Medical History  Diagnosis Date  . Hypokalemia   . HTN (hypertension)   . Sleep apnea   . Endometrial polyp   . CAD (coronary artery disease)     a. NSTEMI 03/2012 ([place] 03/27/12: pLAD 30%, mLAD 99%, mCFX 95%, mRCA 99% with thrombus, EF 65%);  s/p PTCA/DESx1 to prox-mid RCA 03/27/12 urgently in setting of hypotension/bradycardia, with staged PTCA/Evolve study stent to mid LAD & PTCA/Evolve study stent to prox LCx 03/29/12 ;   echo 03/28/12:EF 60%,  Aortic sclerosis without AS, mild RVE, mild reduced RVSF    . Hyperglycemia   . Hyperlipidemia   . Atrial tachycardia     a. noted at cardiac rehab 5/13; event monitor ordered to assess for AFib  . MI, old     Past Surgical History  Procedure Laterality Date  . Cholecystectomy  1978  . Appendectomy  74 years old  . Tonsillectomy  74 years old  . Cesarean section  1984  . Carotid stent  04/2012    x3  . Left heart catheterization with coronary angiogram N/A 03/27/2012    Procedure: LEFT HEART CATHETERIZATION WITH CORONARY ANGIOGRAM;  Surgeon: Burnell Blanks, MD;  Location[facility] C[facility] LAB;  Service: Cardiovascular;  Laterality: N/A;  . Percutaneous coronary stent intervention (pci-s) N/A  03/27/2012    Procedure: PERCUTANEOUS CORONARY STENT INTERVENTION (PCI-S);  Surgeon: Burnell Blanks, MD;  Location: [facility] CATH LAB;  Service: Cardiovascular;  Laterality: N/A;  . Percutaneous coronary stent intervention (pci-s) N/A 03/29/2012    Procedure: PERCUTANEOUS CORONARY STENT INTERVENTION (PCI-S);  Surgeon: [name], MD;  Location: [facility] C[facility] LAB;  Service: Cardiovascular;  Laterality: N/A;    Current Outpatient Prescriptions  Medication Sig Dispense Refill  . albuterol (PROVENTIL HFA;VENTOLIN HFA) 108 (90 BASE) MCG/ACT inhaler Inhale 2 puffs into the lungs every 6 (six) hours as needed for wheezing or shortness of breath. 1 Inhaler 2  . ALPRAZolam (XANAX) 0.5 MG tablet TAKE 1 TABLET TWICE DAILY AS NEEDED FOR ANXIETY OR SLEEP 60 tablet 0  . amLODipine (NORVASC) 5 MG tablet TAKE 1 TABLET (5 MG TOTAL) BY MOUTH DAILY. 30 tablet 6  . aspirin 81 MG tablet Take 1 tablet (81 mg total) by mouth daily.    . bacitracin ointment Apply topically daily. 120 g 0  . Calcium Carbonate-Vitamin D (CALTRATE 600+D) 600-400 MG-UNIT per tablet Take 1 tablet by mouth 2 (two) times daily.     . clopidogrel (PLAVIX) 75 MG tablet TAKE 1 TABLET (75 MG TOTAL) BY MOUTH DAILY. 90 tablet 0  . CRESTOR 10 MG tablet TAKE  1 TABLET (10 MG TOTAL) BY MOUTH DAILY. 90 tablet 0  . desvenlafaxine (PRISTIQ) 50 MG 24 hr tablet Take 1 tablet (50 mg total) by mouth daily. 30 tablet 3  . doxycycline (VIBRA-TABS) 100 MG tablet Take 1 tablet (100 mg total) by mouth 2 (two) times daily. 20 tablet 0  . esomeprazole (NEXIUM) 40 MG capsule Take 40 mg by mouth daily at 12 noon.    . fluconazole (DIFLUCAN) 150 MG tablet Take 1 tablet (150 mg total) by mouth once. 1 tablet 0  . FOLBIC 2.5-25-2 MG TABS TAKE 1 TABLET BY MOUTH EVERY DAY 30 tablet 11  . hydrochlorothiazide (HYDRODIURIL) 25 MG tablet TAKE 1/2 TABLET BY MOUTH DAILY. 45 tablet 1  . HYDROcodone-acetaminophen (NORCO) 10-325 MG per tablet Take 1 tablet by mouth every 8 (eight) hours as needed for severe pain. 90 tablet 0  . metoprolol (LOPRESSOR) 50 MG tablet Take 50 mg by mouth 2 (two) times daily.    . nitroGLYCERIN (NITROSTAT) 0.4 MG SL tablet Place 1 tablet (0.4 mg total) under the tongue every 5 (five) minutes x 3 doses as needed for chest pain. 25 tablet 2   No current facility-administered medications for this visit.    Allergies as of 06/04/2015 - Review Complete 06/04/2015  Allergen Reaction Noted  . Penicillins Hives and Swelling   . Sulfonamide derivatives Hives and Swelling   . Zinc oxide Rash     Vitals: BP 162/80 mmHg  Pulse 80  Resp 20  Ht 5' 2.21" (1.58 m)  Wt 167 lb (75.751 kg)  BMI 30.34 kg/m2 Last Weight:  Wt Readings from Last 1 Encounters:  06/04/15 167 lb (75.751 kg)   Last Height:   Ht Readings from Last 1 Encounters:  06/04/15 5' 2.21" (1.58 m)    Physical exam:  General: The patient is awake, alert and appears not in acute distress. The patient is well groomed. Head: Normocephalic, atraumatic. Neck is supple. Mallampati 4, neck circumference:15 Cardiovascular:  Regular rate and rhythm without  murmurs or carotid bruit, and without distended neck veins. Respiratory: Lungs are clear to auscultation. Skin:  Without evidence of edema,  or rash Trunk: BMI is  elevated and patient  has  normal posture.  Neurologic exam : The patient is awake and alert, oriented to place and time.  Memory subjective  described as unimpaired . MOCA 28-30, the only 2 points Mrs. Coletta missed on her [place] cognitive assessment test were to of the 5 recall words. A score of 28 out of 30 is considered normal. The performed the MMSE prior this 29 out of 30 points and here she missed 1 of the serial 7.  There is a normal attention span & concentration ability.  Speech is fluent without dysarthria, dysphonia or aphasia. Mood and affect are appropriate.  Cranial nerves: Pupils are equal and briskly reactive to light. Funduscopic exam without  evidence of pallor or edema. Extraocular movements  in vertical and horizontal planes intact and without nystagmus.  Visual fields by finger perimetry are intact. Hearing to finger rub intact.  Facial sensation intact to fine touch. Facial motor strength is symmetric and tongue and uvula move midline. Tongue protrusion into either cheek is normal. Shoulder shrug is normal.   Motor exam: Normal tone, muscle bulk and symmetric strength in all extremities.  Sensory:  Fine touch, pinprick and vibration were tested in all extremities. Proprioception was normal.  Coordination: Rapid alternating movements in the fingers/hands were normal.  Finger-to-nose maneuver  normal without evidence of ataxia, dysmetria or tremor.  Gait and station: Patient walks without assistive device and is able unassisted to climb up to the exam table.  Strength within normal limits. Stance is stable and normal. Tandem gait is unfragmented. Romberg testing is negative   Deep tendon reflexes: in the  upper and lower extremities are symmetric and intact. Babinski maneuver downgoing.   Assessment:  After physical and neurologic examination, review of laboratory studies, imaging, neurophysiology testing and pre-existing records, 40 minute   assessment is that of :  COMPARISON: CT head 02/14/2013.  FINDINGS: No evidence for acute infarction, hemorrhage, mass lesion, hydrocephalus, or extra-axial fluid. Mild atrophy, not unexpected for age. Mild subcortical and periventricular T2 and FLAIR hyperintensities, likely chronic microvascular ischemic change. Pituitary, pineal, and cerebellar tonsils unremarkable. No upper cervical lesions. Flow voids are maintained throughout the carotid, basilar, and vertebral arteries. There are no areas of chronic hemorrhage.  Post infusion, no abnormal enhancement of the brain or meninges. Visualized calvarium, skull base, and upper cervical osseous structures unremarkable. Scalp and extracranial soft tissues, orbits, sinuses, and mastoids show no acute process.  IMPRESSION: Mild chronic changes as described. No acute intracranial findings.   Electronically Signed  By: Rolla Flatten M.D.  On: 02/11/2015 15:08  Plan:  Treatment plan and additional workup :  1) continue Pristiq, keep away form wellbutrin for RLS and anxiety side effect.  2) OSA nor found, but prolonged period of hypoxemia. She has not enough apnea to justify CPAP, but enough hypoxemia to be on oxygen at night. Sees Dr.Alava tomorrow.  3) RLS intermittent , better since off Wellbutrin.   50% of our 25 minute post sleep study evaluation today I spent face-to-face discussing the patient's symptoms part of which have already improved. Visit change in her statin drug her muscle achiness soreness and cramps have improved with a change away from Wellbutrin she has less restless legs and only intermittently is bothered by those.   Asencion Partridge Maxim Bedel MD 06/04/2015               CBC with Differential  Status: EditedResult-FINAL Visible to patient:  MyChart Nextappt: 06/13/2015 at 08:45 AM in Cardiology [name], MD) Dx:  Other fatigue  Lipid Panel  Status:  EditedResult-FINAL Visible to patient:  MyChart Nextappt: 06/13/2015 at 08:45 AM in Cardiology [name], MD) Dx:  Coronary artery disease involving nat...           TSH  Status: Finalresult Visible to patient:  MyChart Nextappt: 06/13/2015 at 08:45 AM in Cardiology [name], MD) Dx:  Other fatigue           Ref Range 37mo ago  58mo ago  46yr ago  27yr ago     TSH 0.350 - 4.500 uIU/mL 2.544 3.100CM 1.100 1.[place] [facility]    Narrative     Performed at: [address]        [place], [place] [address]       Specimen Collected: 01/25/15 9:14 AM Last Resulted: 01/26/15 12:53 AM             CM=Additional comments         Comprehensive metabolic panel  Status: EditedResult-FINAL Visible to patient:  MyChart Nextappt: 06/13/2015 at 08:45 AM in Cardiology [name], MD) Dx:  Coronary artery disease involving nat...              Ref Range 37mo ago  51mo ago  58mo ago  59mo ago     Sodium 135 - 145 mEq/L 141 138R, CM 141R 146R    Potassium 3.5 - 5.3 mEq/L 4.1 3.6R, CM 3.5 (L)R 3.4 (L)R    Chloride 96 - 112 mEq/L 99 99 96 101    CO2 19 - 32 mEq/L 36 (H) 29R 31 31    Glucose, Bld 70 - 99 mg/dL 88 98 100 (H) 114 (H)    BUN 6 - 23 mg/dL 13 13 11 10     Creat 0.50 - 1.10 mg/dL 0.82 0.82 0.69 0.71    Total Bilirubin 0.2 - 1.2 mg/dL 0.5   0.7R    Alkaline Phosphatase 39 - 117 U/L 71   72    AST 0 - 37 U/L 21   23    ALT 0 - 35 U/L 15   14    Total Protein 6.0 - 8.3 g/dL 6.6   7.1    Albumin 3.5 - 5.2 g/dL 4.3   3.9    Calcium 8.4 - 10.5 mg/dL 9.3 8.9 9.7 9.8   Resulting Agency  [facility]    Narrative     Performed at: [address]        [place], [place] [address]       Specimen Collected:  01/25/15 9:14 AM Last Resulted: 01/25/15 10:28 PM             CM=Additional commentsR=Reference range differs from displayed range         Folate  Status: Finalresult Visible to patient:  MyChart Nextappt: 06/13/2015 at 08:45 AM in Cardiology [name], MD) Dx:  Other fatigue; Memory loss; Coronary ...         Ref Range 72mo ago    Folate ng/mL >20.0   Comments:   Reference Ranges      Deficient:    0.4 - 3.3 ng/mL      Indeterminate:  3.4 - 5.4 ng/mL      Normal:       > 5.4 ng/mL       Resulting Agency  [facility]     Narrative     Performed at: [address], [address]        , [place] [address]       Specimen Collected: 01/25/15 9:14 AM Last Resulted: 01/26/15 12:53 AM

## 2015-06-05 ENCOUNTER — Encounter: Payer: Self-pay | Admitting: Pulmonary Disease

## 2015-06-05 ENCOUNTER — Other Ambulatory Visit: Payer: Medicare Other

## 2015-06-05 ENCOUNTER — Encounter (HOSPITAL_COMMUNITY)
Admission: RE | Admit: 2015-06-05 | Discharge: 2015-06-05 | Disposition: A | Discharge status: Home / Self Care | Payer: Self-pay | Source: Ambulatory Visit | Attending: Cardiovascular Disease | Admitting: Cardiovascular Disease

## 2015-06-05 ENCOUNTER — Ambulatory Visit (INDEPENDENT_AMBULATORY_CARE_PROVIDER_SITE_OTHER): Payer: Medicare Other | Admitting: Pulmonary Disease

## 2015-06-05 ENCOUNTER — Ambulatory Visit (INDEPENDENT_AMBULATORY_CARE_PROVIDER_SITE_OTHER)
Admission: RE | Admit: 2015-06-05 | Discharge: 2015-06-05 | Disposition: A | Discharge status: Home / Self Care | Payer: Medicare Other | Source: Ambulatory Visit | Attending: Pulmonary Disease | Admitting: Pulmonary Disease

## 2015-06-05 VITALS — BP 140/80 | HR 87 | Ht 62.0 in | Wt 167.8 lb

## 2015-06-05 DIAGNOSIS — R0602 Shortness of breath: Secondary | ICD-10-CM | POA: Diagnosis not present

## 2015-06-05 DIAGNOSIS — I2510 Atherosclerotic heart disease of native coronary artery without angina pectoris: Secondary | ICD-10-CM | POA: Diagnosis not present

## 2015-06-05 DIAGNOSIS — R0600 Dyspnea, unspecified: Secondary | ICD-10-CM

## 2015-06-05 DIAGNOSIS — R0902 Hypoxemia: Secondary | ICD-10-CM | POA: Diagnosis not present

## 2015-06-05 NOTE — Assessment & Plan Note (Signed)
Cause of moderate desaturation with exertion and nocturnal hypoxia is not clear. She does not seem to have overt cardiopulmonary disease. No crackles on exam and clear chest x-ray makes interstitial lung disease less likely. She does not seem to have significant for hypertension on her last echo in 2013. No risk factors for VTE- but will obtain d-dimer today, if positive may need VQ scan for chronic PE. We'll also consider repeating echo PFTs would be low yield in this setting-she does not carry a childhood history of asthma We will obtain nocturnal oximetry and if she qualifies we'll proceed with providing her 1-2 L of oxygen during sleep

## 2015-06-05 NOTE — Patient Instructions (Signed)
Check oxygen levels during sleep - if you qualify will get you oxygen to sleep with Blood test CXR today

## 2015-06-05 NOTE — Progress Notes (Signed)
Subjective:    Patient ID: Carrie Chung, female    DOB: [birth year], 74 y.o.   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 She smoked about 10 pack years before she quit in 1982 She reports a chronic dry cough -which persisted in spite of stopping ACE inhibitor.   She emigrated from [place] and has lived in [place] for at least 63 years She had a myocardial infarction in 2013 and suffered depression after the death of her mother. She is in maintenance cardiac rehabilitation. She has noted herself to desaturate after exertion as low as 91%. She underwent overnight polysomnogram by a neurologist in 04/2015 (dohmeier)- this does not show significant OSA -AHI 4.5, RDI 7.6/hwhich showed desaturation for 106 minutes. She was restless leg syndrome which is improved off Wellbutrin  She did desaturate to 89% on the third lap, with heart rate increasing from 80-113    Significant tests/ events 03/2012 echo normal EF 04/2014 exercise stress test normal 04/2014, 05/2015  CXR normal   Past Medical History  Diagnosis Date  . Hypokalemia   . HTN (hypertension)   . Sleep apnea   . Endometrial polyp   . CAD (coronary artery disease)     a. NSTEMI 03/2012 ([place] 03/27/12: pLAD 30%, mLAD 99%, mCFX 95%, mRCA 99% with thrombus, EF 65%);  s/p PTCA/DESx1 to prox-mid RCA 03/27/12 urgently in setting of hypotension/bradycardia, with staged PTCA/Evolve study stent to mid LAD & PTCA/Evolve study stent to prox LCx 03/29/12 ;   echo 03/28/12:EF 60%, Aortic sclerosis without AS, mild RVE, mild reduced RVSF    . Hyperglycemia   . Hyperlipidemia   . Atrial tachycardia     a. noted at cardiac rehab 5/13; event monitor ordered to assess for AFib  . MI, old     Past Surgical History  Procedure Laterality Date  . Cholecystectomy  1978  . Appendectomy  74 years old  . Tonsillectomy  74 years old  . Cesarean section  1984  . Carotid stent   04/2012    x3  . Left heart catheterization with coronary angiogram N/A 03/27/2012    Procedure: LEFT HEART CATHETERIZATION WITH CORONARY ANGIOGRAM;  Surgeon: Burnell Blanks, MD;  Location: [facility] CATH LAB;  Service: Cardiovascular;  Laterality: N/A;  . Percutaneous coronary stent intervention (pci-s) N/A 03/27/2012    Procedure: PERCUTANEOUS CORONARY STENT INTERVENTION (PCI-S);  Surgeon: Burnell Blanks, MD;  Location: [facility] CATH LAB;  Service: Cardiovascular;  Laterality: N/A;  . Percutaneous coronary stent intervention (pci-s) N/A 03/29/2012    Procedure: PERCUTANEOUS CORONARY STENT INTERVENTION (PCI-S);  Surgeon: [name], MD;  Location: [facility] CATH LAB;  Service: Cardiovascular;  Laterality: N/A;    Allergies  Allergen Reactions  . Penicillins Hives and Swelling  . Sulfonamide Derivatives Hives and Swelling  . Zinc Oxide Rash    History   Social History  . Marital Status: Married    Spouse Name: N/A  . Number of Children: 1  . Years of Education: Coll +   Occupational History  . Retired Marine scientist    Social History Main Topics  . Smoking status: Former Smoker    Types: Cigarettes    Quit date: 04/19/1980  . Smokeless tobacco: Never Used  . Alcohol Use: 12.0 oz/week    20 Glasses of wine per week     Comment: socially  . Drug Use: No  . Sexual Activity: No   Other Topics Concern  .  Not on file   Social History Narrative   Caffeine 2 cups tea.    Family History  Problem Relation Age of Onset  . Coronary artery disease Mother     CABG at age 13  . Cancer Father     larynx     Review of Systems  Constitutional: Negative for fever, chills and unexpected weight change.  HENT: Negative for congestion, dental problem, ear pain, nosebleeds, postnasal drip, rhinorrhea, sinus pressure, sneezing, sore throat, trouble swallowing and voice change.   Eyes: Negative for visual disturbance.  Respiratory: Positive for cough and shortness of breath. Negative for choking.     Cardiovascular: Negative for chest pain and leg swelling.  Gastrointestinal: Negative for vomiting, abdominal pain and diarrhea.  Genitourinary: Negative for difficulty urinating.  Musculoskeletal: Negative for arthralgias.  Skin: Negative for rash.  Neurological: Negative for tremors, syncope and headaches.  Hematological: Does not bruise/bleed easily.       Objective:   Physical Exam  Gen. Pleasant, well-nourished, in no distress, normal affect ENT - no lesions, no post nasal drip Neck: No JVD, no thyromegaly, no carotid bruits Lungs: no use of accessory muscles, no dullness to percussion, clear without rales or rhonchi  Cardiovascular: Rhythm regular, heart sounds  normal, no murmurs or gallops, no peripheral edema Abdomen: soft and non-tender, no hepatosplenomegaly, BS normal. Musculoskeletal: No deformities, no cyanosis or clubbing Neuro:  alert, non focal       Assessment & Plan:

## 2015-06-06 LAB — D-DIMER, QUANTITATIVE (NOT AT ARMC): D DIMER QUANT: 0.3 ug{FEU}/mL (ref 0.00–0.48)

## 2015-06-06 NOTE — Progress Notes (Signed)
I agree with the assessment and plan as directed by NP .The patient is known to me .   Uriah Trueba, MD  

## 2015-06-07 ENCOUNTER — Encounter (HOSPITAL_COMMUNITY)
Admission: RE | Admit: 2015-06-07 | Discharge: 2015-06-07 | Disposition: A | Discharge status: Home / Self Care | Payer: Self-pay | Source: Ambulatory Visit | Attending: Cardiovascular Disease | Admitting: Cardiovascular Disease

## 2015-06-07 ENCOUNTER — Telehealth: Payer: Self-pay | Admitting: Pulmonary Disease

## 2015-06-07 NOTE — Telephone Encounter (Signed)
Patient notified of lab results. Patient would like to know is there is anything else that can be done to determine why she has such a bad cough.  She says that she received a call from our office to schedule Pulse Oximetry testing and they will be setting that up with [facility] soon. Is there anything else that needs to be done?   RA - please advise.

## 2015-06-10 ENCOUNTER — Encounter (HOSPITAL_COMMUNITY)
Admission: RE | Admit: 2015-06-10 | Discharge: 2015-06-10 | Disposition: A | Discharge status: Home / Self Care | Payer: Self-pay | Source: Ambulatory Visit | Attending: Cardiovascular Disease | Admitting: Cardiovascular Disease

## 2015-06-12 ENCOUNTER — Encounter (HOSPITAL_COMMUNITY): Payer: Self-pay

## 2015-06-12 NOTE — Telephone Encounter (Signed)
  msg sent 06/07/15 Patient notified of lab results.  Patient would like to know is there is anything else that can be done to determine why she has such a bad cough.  She says that she received a call from our office to schedule Pulse Oximetry testing and they will be setting that up with [facility] soon.  Is there anything else that needs to be done?   RA - please advise.

## 2015-06-13 ENCOUNTER — Ambulatory Visit (INDEPENDENT_AMBULATORY_CARE_PROVIDER_SITE_OTHER): Payer: Medicare Other | Admitting: Cardiovascular Disease

## 2015-06-13 ENCOUNTER — Encounter: Payer: Self-pay | Admitting: Cardiovascular Disease

## 2015-06-13 VITALS — BP 140/76 | HR 64 | Ht 62.0 in | Wt 170.8 lb

## 2015-06-13 DIAGNOSIS — I10 Essential (primary) hypertension: Secondary | ICD-10-CM

## 2015-06-13 DIAGNOSIS — R0902 Hypoxemia: Secondary | ICD-10-CM | POA: Diagnosis not present

## 2015-06-13 DIAGNOSIS — I2510 Atherosclerotic heart disease of native coronary artery without angina pectoris: Secondary | ICD-10-CM

## 2015-06-13 DIAGNOSIS — E785 Hyperlipidemia, unspecified: Secondary | ICD-10-CM | POA: Diagnosis not present

## 2015-06-13 NOTE — Patient Instructions (Signed)
Medication Instructions:  Your physician recommends that you continue on your current medications as directed. Please refer to the Current Medication list given to you today.  Labwork: No new orders.   Testing/Procedures: Your physician has requested that you have an echocardiogram with bubble study. Echocardiography is a painless test that uses sound waves to create images of your heart. It provides your doctor with information about the size and shape of your heart and how well your heart's chambers and valves are working. This procedure takes approximately one hour. There are no restrictions for this procedure.  Follow-Up: Your physician wants you to follow-up in: 6 MONTHS with Dr Burt Knack.  You will receive a reminder letter in the mail two months in advance. If you don't receive a letter, please call our office to schedule the follow-up appointment.  Any Other Special Instructions Will Be Listed Below (If Applicable).

## 2015-06-13 NOTE — Progress Notes (Signed)
Cardiology Office Note Date:  06/14/2015   ID:  Carrie Chung, Carrie Chung 1941/01/07, MRN [id number]  PCP:  [name], MD  Cardiologist:  [name], MD    No chief complaint on file.   History of Present Illness: Carrie Chung is a 74 y.o. female who presents for follow-up evaluation.  The patient is followed for coronary artery disease. She underwent multivessel stenting in April 2013 after presenting with a non-ST elevation MI. She was treated with stenting of the right coronary artery initially followed by staged PCI of the LAD and left circumflex. She was treated with drug-eluting stents. Her left ventricular function was normal with an EF of 60%. She had a stress Myoview scan in December 2013 and this showed normal perfusion and normal LV function. She again presented with chest pain in May 2015 and was evaluated with observation in the hospital. She ruled out for myocardial infarction and underwent a stress echocardiogram demonstrating no ischemic changes.  The patient is doing ok. Denies symptoms of exertional angina. She does complain of DOE and fatigue. Was noted to have arterial hypoxemia and is undergoing pulmonary evaluation. Plans noted for overnight oximetry. A chest XRay was normal.  Past Medical History  Diagnosis Date  . Hypokalemia   . HTN (hypertension)   . Sleep apnea   . Endometrial polyp   . CAD (coronary artery disease)     a. NSTEMI 03/2012 ([place] 03/27/12: pLAD 30%, mLAD 99%, mCFX 95%, mRCA 99% with thrombus, EF 65%);  s/p PTCA/DESx1 to prox-mid RCA 03/27/12 urgently in setting of hypotension/bradycardia, with staged PTCA/Evolve study stent to mid LAD & PTCA/Evolve study stent to prox LCx 03/29/12 ;   echo 03/28/12:EF 60%, Aortic sclerosis without AS, mild RVE, mild reduced RVSF    . Hyperglycemia   . Hyperlipidemia   . Atrial tachycardia     a. noted at cardiac rehab 5/13; event monitor ordered to assess for AFib  . MI, old     Past Surgical History    Procedure Laterality Date  . Cholecystectomy  1978  . Appendectomy  74 years old  . Tonsillectomy  74 years old  . Cesarean section  1984  . Carotid stent  04/2012    x3  . Left heart catheterization with coronary angiogram N/A 03/27/2012    Procedure: LEFT HEART CATHETERIZATION WITH CORONARY ANGIOGRAM;  Surgeon: Burnell Blanks, MD;  Location: [facility] CATH LAB;  Service: Cardiovascular;  Laterality: N/A;  . Percutaneous coronary stent intervention (pci-s) N/A 03/27/2012    Procedure: PERCUTANEOUS CORONARY STENT INTERVENTION (PCI-S);  Surgeon: Burnell Blanks, MD;  Location: [facility] CATH LAB;  Service: Cardiovascular;  Laterality: N/A;  . Percutaneous coronary stent intervention (pci-s) N/A 03/29/2012    Procedure: PERCUTANEOUS CORONARY STENT INTERVENTION (PCI-S);  Surgeon: [name], MD;  Location: [facility] CATH LAB;  Service: Cardiovascular;  Laterality: N/A;    Current Outpatient Prescriptions  Medication Sig Dispense Refill  . albuterol (PROVENTIL HFA;VENTOLIN HFA) 108 (90 BASE) MCG/ACT inhaler Inhale 2 puffs into the lungs every 6 (six) hours as needed for wheezing or shortness of breath. 1 Inhaler 2  . ALPRAZolam (XANAX) 0.5 MG tablet Take 0.5 mg by mouth at bedtime as needed for anxiety.    Marland Kitchen amLODipine (NORVASC) 5 MG tablet TAKE 1 TABLET (5 MG TOTAL) BY MOUTH DAILY. 30 tablet 6  . aspirin 81 MG tablet Take 1 tablet (81 mg total) by mouth daily.    . bacitracin ointment Apply topically daily. 120 g  0  . Calcium Carbonate-Vitamin D (CALTRATE 600+D) 600-400 MG-UNIT per tablet Take 1 tablet by mouth 2 (two) times daily.     . clopidogrel (PLAVIX) 75 MG tablet TAKE 1 TABLET (75 MG TOTAL) BY MOUTH DAILY. 90 tablet 0  . CRESTOR 10 MG tablet TAKE 1 TABLET (10 MG TOTAL) BY MOUTH DAILY. 90 tablet 0  . desvenlafaxine (PRISTIQ) 50 MG 24 hr tablet Take 1 tablet (50 mg total) by mouth daily. 30 tablet 3  . esomeprazole (NEXIUM) 40 MG capsule Take 40 mg by mouth daily at 12 noon.    . FOLBIC  2.5-25-2 MG TABS TAKE 1 TABLET BY MOUTH EVERY DAY 30 tablet 11  . hydrochlorothiazide (HYDRODIURIL) 25 MG tablet TAKE 1/2 TABLET BY MOUTH DAILY. 45 tablet 1  . HYDROcodone-acetaminophen (NORCO) 10-325 MG per tablet Take 1 tablet by mouth every 8 (eight) hours as needed for severe pain. 90 tablet 0  . metoprolol (LOPRESSOR) 50 MG tablet Take 50 mg by mouth 2 (two) times daily.    . nitroGLYCERIN (NITROSTAT) 0.4 MG SL tablet Place 1 tablet (0.4 mg total) under the tongue every 5 (five) minutes x 3 doses as needed for chest pain. 25 tablet 2   No current facility-administered medications for this visit.    Allergies:   Penicillins; Sulfonamide derivatives; and Zinc oxide   Social History:  The patient  reports that she quit smoking about 35 years ago. Her smoking use included Cigarettes. She has never used smokeless tobacco. She reports that she drinks about 12.0 oz of alcohol per week. She reports that she does not use illicit drugs.   Family History:  The patient's family history includes Cancer in her father; Coronary artery disease in her mother.  ROS:  Please see the history of present illness.  Otherwise, review of systems is positive for appetite change, leg swelling, cough, DOE, depression, back pain, muscle pain, excessive sweating, wheezing, and anxiety.  All other systems are reviewed and negative.   PHYSICAL EXAM: VS:  BP 140/76 mmHg  Pulse 64  Ht 5\' 2"  (1.575 m)  Wt 170 lb 12.8 oz (77.474 kg)  BMI 31.23 kg/m2 , BMI Body mass index is 31.23 kg/(m^2). GEN: Well nourished, well developed, in no acute distress HEENT: normal Neck: no JVD, no masses. No carotid bruits Cardiac: RRR without murmur or gallop                Respiratory:  clear to auscultation bilaterally, normal work of breathing GI: soft, nontender, nondistended, + BS MS: no deformity or atrophy Ext: no pretibial edema, pedal pulses 2+= bilaterally Skin: warm and dry, no rash Neuro:  Strength and sensation are  intact Psych: euthymic mood, full affect  EKG:  EKG is ordered today. The ekg ordered today shows NSR 64 bpm, within normal limits  Recent Labs: 01/25/2015: ALT 15; BUN 13; Creat 0.82; Hemoglobin 12.4; Platelets 178; Potassium 4.1; Sodium 141; TSH 2.544   Lipid Panel     Component Value Date/Time   CHOL 130 01/25/2015 0914   TRIG 110 01/25/2015 0914   HDL 61 01/25/2015 0914   CHOLHDL 2.1 01/25/2015 0914   VLDL 22 01/25/2015 0914   LDLCALC 47 01/25/2015 0914   LDLDIRECT 152.4 03/09/2012 0900      Wt Readings from Last 3 Encounters:  06/13/15 170 lb 12.8 oz (77.474 kg)  06/05/15 167 lb 12.8 oz (76.114 kg)  06/04/15 167 lb (75.751 kg)     Cardiac Studies Reviewed: CXR: FINDINGS: Mild  pulmonary hyperinflation. Negative for pneumonia. Negative for heart failure or effusion. Lungs are clear.  Heart size normal. Coronary artery stents noted.  IMPRESSION: No active cardiopulmonary disease.  ASSESSMENT AND PLAN: 1.  CAD, native vessel: stable without symptoms of angina. Continues to participate in the maintenance phase of cardiac rehab. Medications reviewed and will be continued without change. With multivessel stenting she is continued on DAPT with ASA and plavix.  2. DOE/hypoxemia: pulmonary evaluation reviewed. Recommend an echo with bubble study to rule out intracardiac shunt. Advised weight loss/exercise. Stress test from 2015 reviewed without ischemia.  3. HTN - bp controlled on current Rx  4. Hyperlipidemia - tolerating a statin drug without problems (Crestor).   Current medicines are reviewed with the patient today.  The patient does not have concerns regarding medicines.  Labs/ tests ordered today include:   Orders Placed This Encounter  Procedures  . EKG 12-Lead  . ECHO BUBBLE STUDY    Disposition:   FU 6 months  Signed, [name], MD  06/14/2015 [facility] [place], [place], [place]  [address] Phone: [phone]; Fax: [phone]

## 2015-06-14 ENCOUNTER — Encounter: Payer: Self-pay | Admitting: Cardiovascular Disease

## 2015-06-14 ENCOUNTER — Encounter (HOSPITAL_COMMUNITY): Payer: Self-pay

## 2015-06-14 NOTE — Telephone Encounter (Signed)
Chronic cough like this is generally related to ACE inhibitor, sinus drip or reflux Trial of-chlorpheniramine 8 mg by mouth daily at bedtime x 4wks If this does not work, we'll trial of antireflux therapy

## 2015-06-14 NOTE — Telephone Encounter (Signed)
Patient was seen by Dr. Burt Knack, he ordered an Echo to be done next Wednesday at [facility]  Patient says that she is currently on Nexium.  Patient will try Chlorpheniramine ONO test is going to be done this Monday 06/17/2015  FYI to [place]

## 2015-06-16 ENCOUNTER — Other Ambulatory Visit: Payer: Self-pay | Admitting: Internal Medicine

## 2015-06-17 ENCOUNTER — Encounter (HOSPITAL_COMMUNITY)
Admission: RE | Admit: 2015-06-17 | Discharge: 2015-06-17 | Disposition: A | Discharge status: Home / Self Care | Payer: Self-pay | Source: Ambulatory Visit | Attending: Cardiovascular Disease | Admitting: Cardiovascular Disease

## 2015-06-17 ENCOUNTER — Other Ambulatory Visit: Payer: Self-pay

## 2015-06-17 DIAGNOSIS — R0602 Shortness of breath: Secondary | ICD-10-CM | POA: Diagnosis not present

## 2015-06-17 DIAGNOSIS — R0600 Dyspnea, unspecified: Secondary | ICD-10-CM | POA: Diagnosis not present

## 2015-06-19 ENCOUNTER — Ambulatory Visit (HOSPITAL_COMMUNITY): Payer: Medicare Other | Attending: Cardiovascular Disease

## 2015-06-19 ENCOUNTER — Other Ambulatory Visit: Payer: Self-pay

## 2015-06-19 ENCOUNTER — Encounter (HOSPITAL_COMMUNITY)
Admission: RE | Admit: 2015-06-19 | Discharge: 2015-06-19 | Disposition: A | Discharge status: Home / Self Care | Payer: Self-pay | Source: Ambulatory Visit | Attending: Cardiovascular Disease | Admitting: Cardiovascular Disease

## 2015-06-19 DIAGNOSIS — E785 Hyperlipidemia, unspecified: Secondary | ICD-10-CM | POA: Diagnosis not present

## 2015-06-19 DIAGNOSIS — R0609 Other forms of dyspnea: Secondary | ICD-10-CM | POA: Diagnosis not present

## 2015-06-19 DIAGNOSIS — I10 Essential (primary) hypertension: Secondary | ICD-10-CM | POA: Insufficient documentation

## 2015-06-19 DIAGNOSIS — Z87891 Personal history of nicotine dependence: Secondary | ICD-10-CM | POA: Diagnosis not present

## 2015-06-19 DIAGNOSIS — I2510 Atherosclerotic heart disease of native coronary artery without angina pectoris: Secondary | ICD-10-CM | POA: Diagnosis not present

## 2015-06-19 DIAGNOSIS — R0902 Hypoxemia: Secondary | ICD-10-CM

## 2015-06-20 ENCOUNTER — Telehealth: Payer: Self-pay | Admitting: Pulmonary Disease

## 2015-06-20 DIAGNOSIS — R0600 Dyspnea, unspecified: Secondary | ICD-10-CM

## 2015-06-20 DIAGNOSIS — G4734 Idiopathic sleep related nonobstructive alveolar hypoventilation: Secondary | ICD-10-CM

## 2015-06-20 DIAGNOSIS — R0902 Hypoxemia: Secondary | ICD-10-CM

## 2015-06-20 NOTE — Telephone Encounter (Signed)
DisCussed with patient Echo did not show PFO PSG did not show sleep disordered breathing Etiology of hypoxia is unclear- proceed with CT angio chest and PFTs

## 2015-06-20 NOTE — Telephone Encounter (Signed)
I sent this back to triage because I am in [place] and cannot get the ONO out of the folder to fax it.   The folder is in the box in front of the dumbwaiter, if someone can get the report out of the folder and put it in Dr. Bari Mantis box, I can fax it tomorrow.  Otherwise, it will be going down to scan tonight.

## 2015-06-20 NOTE — Telephone Encounter (Signed)
PFT and CT Angio orders entered. Nothing further needed.

## 2015-06-20 NOTE — Telephone Encounter (Signed)
Spoke with Carrie Chung. She needs the oxygen placed in the "template." Advised her that I would take care of this. She will need a respiratory diagnosis other than dyspnea or hypoxemia for Medicare to pay for this.  RA - please advise. Thanks.

## 2015-06-20 NOTE — Telephone Encounter (Signed)
Desat x 5 hours - still needs O2 Recheck ONO on 2L 02

## 2015-06-20 NOTE — Telephone Encounter (Signed)
[phone], pt cb states has another question for nurse

## 2015-06-20 NOTE — Telephone Encounter (Signed)
Melissa calling again about the status of this order msg taken @ 2:28 it is now 4:05 she can be reached @ [phone].Hillery Hunter

## 2015-06-20 NOTE — Telephone Encounter (Signed)
Spoke with pt regarding results.  Orders placed for repeat ONO on 2 liters and to set up 2 liters at bedtime. Pt is wanting her ONO report faxed to Dr. Burt Knack. Sharyn Lull, do you still have this to refax? thanks

## 2015-06-20 NOTE — Telephone Encounter (Signed)
The ONO report is in Dr. Bari Mantis scan folder which is in the dumb waiter.

## 2015-06-20 NOTE — Telephone Encounter (Signed)
Spoke with pt, wants to know why 02 goes down only qhs.  Pt states she checks her pulse and oximeter at cardiac rehab, and she never drops below 90%.    Also, I checked the dumbwaiter and box, this has already been taken down to scan.  It was not in the front box, or in the folder going down to medical records.

## 2015-06-21 ENCOUNTER — Encounter (HOSPITAL_COMMUNITY): Payer: Medicare Other

## 2015-06-21 ENCOUNTER — Ambulatory Visit (INDEPENDENT_AMBULATORY_CARE_PROVIDER_SITE_OTHER)
Admission: RE | Admit: 2015-06-21 | Discharge: 2015-06-21 | Disposition: A | Discharge status: Home / Self Care | Payer: Medicare Other | Source: Ambulatory Visit | Attending: Pulmonary Disease | Admitting: Pulmonary Disease

## 2015-06-21 ENCOUNTER — Other Ambulatory Visit: Payer: Self-pay | Admitting: Pulmonary Disease

## 2015-06-21 ENCOUNTER — Other Ambulatory Visit (INDEPENDENT_AMBULATORY_CARE_PROVIDER_SITE_OTHER): Payer: Medicare Other

## 2015-06-21 DIAGNOSIS — J432 Centrilobular emphysema: Secondary | ICD-10-CM | POA: Diagnosis not present

## 2015-06-21 DIAGNOSIS — R0902 Hypoxemia: Secondary | ICD-10-CM

## 2015-06-21 DIAGNOSIS — I252 Old myocardial infarction: Secondary | ICD-10-CM | POA: Insufficient documentation

## 2015-06-21 DIAGNOSIS — I2510 Atherosclerotic heart disease of native coronary artery without angina pectoris: Secondary | ICD-10-CM | POA: Insufficient documentation

## 2015-06-21 DIAGNOSIS — Z5189 Encounter for other specified aftercare: Secondary | ICD-10-CM | POA: Diagnosis present

## 2015-06-21 DIAGNOSIS — I499 Cardiac arrhythmia, unspecified: Secondary | ICD-10-CM | POA: Insufficient documentation

## 2015-06-21 DIAGNOSIS — I10 Essential (primary) hypertension: Secondary | ICD-10-CM | POA: Diagnosis not present

## 2015-06-21 DIAGNOSIS — Z9861 Coronary angioplasty status: Secondary | ICD-10-CM | POA: Insufficient documentation

## 2015-06-21 DIAGNOSIS — R0600 Dyspnea, unspecified: Secondary | ICD-10-CM

## 2015-06-21 DIAGNOSIS — E785 Hyperlipidemia, unspecified: Secondary | ICD-10-CM | POA: Diagnosis not present

## 2015-06-21 LAB — BASIC METABOLIC PANEL
BUN: 11 mg/dL (ref 6–23)
CALCIUM: 9.3 mg/dL (ref 8.4–10.5)
CO2: 30 mEq/L (ref 19–32)
CREATININE: 0.81 mg/dL (ref 0.40–1.20)
Chloride: 100 mEq/L (ref 96–112)
GFR: 73.3 mL/min (ref >60.00)
GLUCOSE: 101 mg/dL — AB (ref 70–99)
Potassium: 4.1 mEq/L (ref 3.5–5.1)
Sodium: 139 mEq/L (ref 135–145)

## 2015-06-21 MED ORDER — IOHEXOL 350 MG/ML SOLN
80.0000 mL | Freq: Once | INTRAVENOUS | Status: AC | PRN
  Administered 2015-06-21: 80 mL via INTRAVENOUS

## 2015-06-21 NOTE — Telephone Encounter (Signed)
RA - please advise. Thanks! 

## 2015-06-21 NOTE — Telephone Encounter (Signed)
Carrie Chung is calling back about this. Ph # [phone]

## 2015-06-21 NOTE — Telephone Encounter (Signed)
Sleep related hypoxia

## 2015-06-25 NOTE — Telephone Encounter (Signed)
Please use emphysema

## 2015-06-25 NOTE — Telephone Encounter (Signed)
Carrie Chung is calling to get order on the template and needs to talk to nurse about dx. [phone]

## 2015-06-25 NOTE — Telephone Encounter (Signed)
Order entered. Nothing further needed.  

## 2015-06-25 NOTE — Telephone Encounter (Signed)
Called and spoke to [place]. Melissa stated she does not think Medicare will cover a dx of 'sleep related hypoxia' as it is not the reason for her hypoxia. Melissa requested the order still be placed to get pt set up with nocturnal O2 and if there is another dx that is evident after other testing then the dx can be changed. Melissa will contact pt to get O2 set up.   Will send to RA as FYI.

## 2015-06-26 ENCOUNTER — Encounter (HOSPITAL_COMMUNITY)
Admission: RE | Admit: 2015-06-26 | Discharge: 2015-06-26 | Disposition: A | Discharge status: Home / Self Care | Payer: Medicare Other | Source: Ambulatory Visit | Attending: Cardiovascular Disease | Admitting: Cardiovascular Disease

## 2015-06-26 ENCOUNTER — Other Ambulatory Visit: Payer: Self-pay | Admitting: Cardiovascular Disease

## 2015-06-26 NOTE — Progress Notes (Signed)
Quick Note:  Pt returned call. Informed her of the results and recs per RA. PFT scheduled for 7/20 and OV with TP on 7/26. Pt verbalized understanding and denied any further questions or concerns at this time.   ______

## 2015-06-27 ENCOUNTER — Encounter: Payer: Self-pay | Admitting: Pulmonary Disease

## 2015-06-28 ENCOUNTER — Encounter (HOSPITAL_COMMUNITY)
Admission: RE | Admit: 2015-06-28 | Discharge: 2015-06-28 | Disposition: A | Discharge status: Home / Self Care | Payer: Medicare Other | Source: Ambulatory Visit | Attending: Cardiovascular Disease | Admitting: Cardiovascular Disease

## 2015-06-28 DIAGNOSIS — Z5189 Encounter for other specified aftercare: Secondary | ICD-10-CM | POA: Diagnosis not present

## 2015-06-29 ENCOUNTER — Other Ambulatory Visit: Payer: Self-pay | Admitting: Cardiovascular Disease

## 2015-07-01 ENCOUNTER — Encounter (HOSPITAL_COMMUNITY)
Admission: RE | Admit: 2015-07-01 | Discharge: 2015-07-01 | Disposition: A | Discharge status: Home / Self Care | Payer: Medicare Other | Source: Ambulatory Visit | Attending: Cardiovascular Disease | Admitting: Cardiovascular Disease

## 2015-07-03 ENCOUNTER — Encounter (HOSPITAL_COMMUNITY): Payer: Medicare Other

## 2015-07-05 ENCOUNTER — Encounter (HOSPITAL_COMMUNITY)
Admission: RE | Admit: 2015-07-05 | Discharge: 2015-07-05 | Disposition: A | Discharge status: Home / Self Care | Payer: Medicare Other | Source: Ambulatory Visit | Attending: Cardiovascular Disease | Admitting: Cardiovascular Disease

## 2015-07-08 ENCOUNTER — Encounter (HOSPITAL_COMMUNITY)
Admission: RE | Admit: 2015-07-08 | Discharge: 2015-07-08 | Disposition: A | Discharge status: Home / Self Care | Payer: Medicare Other | Source: Ambulatory Visit | Attending: Cardiovascular Disease | Admitting: Cardiovascular Disease

## 2015-07-09 ENCOUNTER — Other Ambulatory Visit: Payer: Self-pay | Admitting: Cardiovascular Disease

## 2015-07-10 ENCOUNTER — Encounter (HOSPITAL_COMMUNITY)
Admission: RE | Admit: 2015-07-10 | Discharge: 2015-07-10 | Disposition: A | Discharge status: Home / Self Care | Payer: Medicare Other | Source: Ambulatory Visit | Attending: Cardiovascular Disease | Admitting: Cardiovascular Disease

## 2015-07-10 ENCOUNTER — Ambulatory Visit (INDEPENDENT_AMBULATORY_CARE_PROVIDER_SITE_OTHER): Payer: Medicare Other | Admitting: Pulmonary Disease

## 2015-07-10 DIAGNOSIS — R0600 Dyspnea, unspecified: Secondary | ICD-10-CM

## 2015-07-10 DIAGNOSIS — R0902 Hypoxemia: Secondary | ICD-10-CM

## 2015-07-10 LAB — PULMONARY FUNCTION TEST
DL/VA % PRED: 105 %
DL/VA: 4.65 ml/min/mmHg/L
DLCO unc % pred: 88 %
DLCO unc: 17.98 ml/min/mmHg
FEF 25-75 Post: 1.71 L/sec
FEF 25-75 Pre: 1.34 L/sec
FEF2575-%CHANGE-POST: 27 %
FEF2575-%Pred-Post: 114 %
FEF2575-%Pred-Pre: 89 %
FEV1-%CHANGE-POST: 6 %
FEV1-%PRED-PRE: 91 %
FEV1-%Pred-Post: 97 %
FEV1-Post: 1.8 L
FEV1-Pre: 1.69 L
FEV1FVC-%CHANGE-POST: 1 %
FEV1FVC-%Pred-Pre: 101 %
FEV6-%CHANGE-POST: 4 %
FEV6-%PRED-POST: 99 %
FEV6-%Pred-Pre: 94 %
FEV6-POST: 2.32 L
FEV6-Pre: 2.21 L
FEV6FVC-%Change-Post: 0 %
FEV6FVC-%Pred-Post: 105 %
FEV6FVC-%Pred-Pre: 105 %
FVC-%CHANGE-POST: 4 %
FVC-%PRED-POST: 94 %
FVC-%PRED-PRE: 89 %
FVC-PRE: 2.21 L
FVC-Post: 2.32 L
Post FEV1/FVC ratio: 77 %
Post FEV6/FVC ratio: 100 %
Pre FEV1/FVC ratio: 76 %
Pre FEV6/FVC Ratio: 100 %
RV % pred: 74 %
RV: 1.59 L
TLC % pred: 84 %
TLC: 3.89 L

## 2015-07-10 NOTE — Progress Notes (Signed)
PFT done today. 

## 2015-07-12 ENCOUNTER — Telehealth: Payer: Self-pay | Admitting: Pulmonary Disease

## 2015-07-12 ENCOUNTER — Encounter (HOSPITAL_COMMUNITY): Payer: Medicare Other

## 2015-07-12 NOTE — Telephone Encounter (Signed)
Patient notified of results of PFT. Nothing further needed.

## 2015-07-15 ENCOUNTER — Encounter (HOSPITAL_COMMUNITY)
Admission: RE | Admit: 2015-07-15 | Discharge: 2015-07-15 | Disposition: A | Discharge status: Home / Self Care | Payer: Medicare Other | Source: Ambulatory Visit | Attending: Cardiovascular Disease | Admitting: Cardiovascular Disease

## 2015-07-16 ENCOUNTER — Encounter: Payer: Self-pay | Admitting: Adult Health

## 2015-07-16 ENCOUNTER — Ambulatory Visit (INDEPENDENT_AMBULATORY_CARE_PROVIDER_SITE_OTHER): Payer: Medicare Other | Admitting: Adult Health

## 2015-07-16 VITALS — BP 150/84 | HR 80 | Temp 98.5°F | Ht 61.0 in | Wt 172.0 lb

## 2015-07-16 DIAGNOSIS — I2510 Atherosclerotic heart disease of native coronary artery without angina pectoris: Secondary | ICD-10-CM

## 2015-07-16 DIAGNOSIS — Z23 Encounter for immunization: Secondary | ICD-10-CM

## 2015-07-16 DIAGNOSIS — R0902 Hypoxemia: Secondary | ICD-10-CM | POA: Diagnosis not present

## 2015-07-16 NOTE — Addendum Note (Signed)
Addended by: Osa Craver on: 07/16/2015 03:14 PM   Modules accepted: Orders

## 2015-07-16 NOTE — Assessment & Plan Note (Signed)
Nocturnal hypoxia ? etiolgy  Workup thus far has been unrevealing with  nml PFT, sleep study, bubble echo , and CT chest  She does have some emphysema and grade 1 DD maybe both are contributing factors.  She is doing well on O2  ONO on O2 pending results requested.  Cont on O2 At bedtime   follow up Dr. Elsworth Soho  In 3 months

## 2015-07-16 NOTE — Progress Notes (Signed)
Subjective:    Patient ID: Carrie Chung, female    DOB: 06-Apr-1941, 74 y.o.   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   06/05/15  She emigrated from [place] and has lived in [place] for at least 32 years She had a myocardial infarction in 2013 and suffered depression after the death of her mother. She is in maintenance cardiac rehabilitation. She has noted herself to desaturate after exertion as low as 91%. She underwent overnight polysomnogram by a neurologist in 04/2015 (dohmeier)- this does not show significant OSA -AHI 4.5, RDI 7.6/hwhich showed desaturation for 106 minutes. She was restless leg syndrome which is improved off Wellbutrin She did desaturate to 89% on the third lap, with heart rate increasing from 80-113    Significant tests/ events 03/2012 echo normal EF 04/2014 exercise stress test normal 04/2014, 05/2015  CXR normal  07/16/2015 Follow up : Nocturnal Hypoxia  Pt returns for follow up .  Seen last ov with eval for nocturnal hypoxia Underwent PFT that showed nml lung function. No airflow obstruction/restriction.  DLCO nml .  CT angio chest neg for PE, mild emphysema .  Bubble echo with gg 1 DD , EF ok , no shunt.  ONO on 2l/m O2 pending.  She is feeling better with less headaches since starting on O2.  She has dry cough but some better.  Going to [place] soon to see family .  No chest pain, orthopnea, edema or fever.     Review of Systems Constitutional:   No  weight loss, night sweats,  Fevers, chills,  +fatigue, or  lassitude.  HEENT:   No headaches,  Difficulty swallowing,  Tooth/dental problems, or  Sore throat,                No sneezing, itching, ear ache, nasal congestion, post nasal drip,   CV:  No chest pain,  Orthopnea, PND,  swelling in lower extremities, anasarca, dizziness, palpitations, syncope.   GI  No heartburn, indigestion, abdominal pain, nausea, vomiting, diarrhea, change in bowel habits, loss of appetite, bloody stools.   Resp:    No chest wall deformity  Skin: no rash or lesions.  GU: no dysuria, change in color of urine, no urgency or frequency.  No flank pain, no hematuria   MS:  No joint pain or swelling.  No decreased range of motion.  No back pain.  Psych:  No change in mood or affect. No depression or anxiety.  No memory loss.         Objective:   Physical Exam GEN: A/Ox3; pleasant , NAD, elderly   HEENT:  [place]/AT,  EACs-clear, TMs-wnl, NOSE-clear, THROAT-clear, no lesions, no postnasal drip or exudate noted.   NECK:  Supple w/ fair ROM; no JVD; normal carotid impulses w/o bruits; no thyromegaly or nodules palpated; no lymphadenopathy.  RESP  Clear  P & A; w/o, wheezes/ rales/ or rhonchi.no accessory muscle use, no dullness to percussion  CARD:  RRR, no m/r/g  , no peripheral edema, pulses intact, no cyanosis or clubbing.  GI:   Soft & nt; nml bowel sounds; no organomegaly or masses detected.  Musco: Warm bil, no deformities or joint swelling noted.   Neuro: alert, no focal deficits noted.    Skin: Warm, no  lesions or rashes         Assessment & Plan:

## 2015-07-16 NOTE — Assessment & Plan Note (Signed)
PFT appear normal  Cont On O2 At bedtime

## 2015-07-16 NOTE — Patient Instructions (Signed)
Continue on Oxygen 2l/m At bedtime   Follow up Dr. Elsworth Soho  In 3 months and As needed   Prevnar vaccine today

## 2015-07-17 ENCOUNTER — Encounter (HOSPITAL_COMMUNITY): Payer: Medicare Other

## 2015-07-17 NOTE — Progress Notes (Signed)
Reviewed & agree with plan  

## 2015-07-19 ENCOUNTER — Encounter (HOSPITAL_COMMUNITY)
Admission: RE | Admit: 2015-07-19 | Discharge: 2015-07-19 | Disposition: A | Discharge status: Home / Self Care | Payer: Medicare Other | Source: Ambulatory Visit | Attending: Cardiovascular Disease | Admitting: Cardiovascular Disease

## 2015-07-19 ENCOUNTER — Telehealth: Payer: Self-pay | Admitting: Pulmonary Disease

## 2015-07-19 NOTE — Telephone Encounter (Signed)
Patient says that she is concerned because we do not have a cause for her Hypoxemia.  Patient wants to know also if she does not sleep with oxygen, will it cause health issues?  She wants to schedule a trip to [place] before beginning of September and wants to know if she can go without it for 10 days without any increased health issues?  Patient wants to know if this is going to affect her daytime oxygen level. Patient has a lot of concerns and would like to speak with you about her concerns.   Patient notified of ONO results. RA - can you call patient and answer her questions?

## 2015-07-22 ENCOUNTER — Encounter (HOSPITAL_COMMUNITY)
Admission: RE | Admit: 2015-07-22 | Discharge: 2015-07-22 | Disposition: A | Discharge status: Home / Self Care | Payer: Self-pay | Source: Ambulatory Visit | Attending: Cardiovascular Disease | Admitting: Cardiovascular Disease

## 2015-07-22 DIAGNOSIS — I252 Old myocardial infarction: Secondary | ICD-10-CM | POA: Insufficient documentation

## 2015-07-22 DIAGNOSIS — I499 Cardiac arrhythmia, unspecified: Secondary | ICD-10-CM | POA: Insufficient documentation

## 2015-07-22 DIAGNOSIS — I2510 Atherosclerotic heart disease of native coronary artery without angina pectoris: Secondary | ICD-10-CM | POA: Insufficient documentation

## 2015-07-22 DIAGNOSIS — Z9861 Coronary angioplasty status: Secondary | ICD-10-CM | POA: Insufficient documentation

## 2015-07-22 DIAGNOSIS — I10 Essential (primary) hypertension: Secondary | ICD-10-CM | POA: Insufficient documentation

## 2015-07-22 DIAGNOSIS — Z5189 Encounter for other specified aftercare: Secondary | ICD-10-CM | POA: Insufficient documentation

## 2015-07-22 DIAGNOSIS — E785 Hyperlipidemia, unspecified: Secondary | ICD-10-CM | POA: Insufficient documentation

## 2015-07-22 NOTE — Telephone Encounter (Signed)
Tried calling her x 2  -left message OK to travel. Will call her back on Wednesday to discuss- pl route msg back to me

## 2015-07-23 NOTE — Telephone Encounter (Signed)
Patient spoke with Dr. Elsworth Soho yesterday.   Nothing further needed.

## 2015-07-24 ENCOUNTER — Encounter (HOSPITAL_COMMUNITY): Payer: Self-pay

## 2015-07-26 ENCOUNTER — Encounter (HOSPITAL_COMMUNITY)
Admission: RE | Admit: 2015-07-26 | Discharge: 2015-07-26 | Disposition: A | Discharge status: Home / Self Care | Payer: Self-pay | Source: Ambulatory Visit | Attending: Cardiovascular Disease | Admitting: Cardiovascular Disease

## 2015-07-29 ENCOUNTER — Encounter (HOSPITAL_COMMUNITY)
Admission: RE | Admit: 2015-07-29 | Discharge: 2015-07-29 | Disposition: A | Discharge status: Home / Self Care | Payer: Self-pay | Source: Ambulatory Visit | Attending: Cardiovascular Disease | Admitting: Cardiovascular Disease

## 2015-07-31 ENCOUNTER — Other Ambulatory Visit: Payer: Self-pay | Admitting: *Deleted

## 2015-07-31 ENCOUNTER — Encounter (HOSPITAL_COMMUNITY): Payer: Self-pay

## 2015-07-31 MED ORDER — ALPRAZOLAM 0.5 MG PO TABS: 0.5000 mg | ORAL_TABLET | Freq: Two times a day (BID) | ORAL | Status: DC | PRN

## 2015-07-31 NOTE — Telephone Encounter (Signed)
Xanax refilled.  

## 2015-08-02 ENCOUNTER — Encounter (HOSPITAL_COMMUNITY): Payer: Self-pay

## 2015-08-05 ENCOUNTER — Encounter (HOSPITAL_COMMUNITY): Payer: Self-pay

## 2015-08-06 DIAGNOSIS — M25571 Pain in right ankle and joints of right foot: Secondary | ICD-10-CM | POA: Diagnosis not present

## 2015-08-06 DIAGNOSIS — M1711 Unilateral primary osteoarthritis, right knee: Secondary | ICD-10-CM | POA: Diagnosis not present

## 2015-08-07 ENCOUNTER — Encounter (HOSPITAL_COMMUNITY): Payer: Self-pay

## 2015-08-07 DIAGNOSIS — H524 Presbyopia: Secondary | ICD-10-CM | POA: Diagnosis not present

## 2015-08-07 DIAGNOSIS — H5231 Anisometropia: Secondary | ICD-10-CM | POA: Diagnosis not present

## 2015-08-07 DIAGNOSIS — H04123 Dry eye syndrome of bilateral lacrimal glands: Secondary | ICD-10-CM | POA: Diagnosis not present

## 2015-08-07 DIAGNOSIS — H25813 Combined forms of age-related cataract, bilateral: Secondary | ICD-10-CM | POA: Diagnosis not present

## 2015-08-09 ENCOUNTER — Encounter (HOSPITAL_COMMUNITY): Payer: Self-pay

## 2015-08-12 ENCOUNTER — Encounter (HOSPITAL_COMMUNITY)
Admission: RE | Admit: 2015-08-12 | Discharge: 2015-08-12 | Disposition: A | Discharge status: Home / Self Care | Payer: Self-pay | Source: Ambulatory Visit | Attending: Cardiovascular Disease | Admitting: Cardiovascular Disease

## 2015-08-13 ENCOUNTER — Telehealth: Payer: Self-pay | Admitting: Pulmonary Disease

## 2015-08-13 ENCOUNTER — Telehealth: Payer: Self-pay | Admitting: Cardiovascular Disease

## 2015-08-13 DIAGNOSIS — R063 Periodic breathing: Secondary | ICD-10-CM

## 2015-08-13 NOTE — Telephone Encounter (Signed)
I spoke with the pt and made her aware that she is okay to use Voltaren gel.  This was prescribed by Dr Durward Fortes. I made her aware that NSAIDs do increase risk of cardiac events but Voltaren gel comes with less risk because it is absorbed through the skin.

## 2015-08-13 NOTE — Telephone Encounter (Signed)
New message      Can pt use voltaren ointment for external joint pain?

## 2015-08-13 NOTE — Telephone Encounter (Signed)
Left message on machine for pt to contact the office.   

## 2015-08-13 NOTE — Telephone Encounter (Signed)
Patient notified. ONO ordered Nothing further needed.

## 2015-08-13 NOTE — Telephone Encounter (Signed)
LMTCB x 1 @ [phone]

## 2015-08-13 NOTE — Telephone Encounter (Signed)
pt returned call  [phone]

## 2015-08-13 NOTE — Telephone Encounter (Signed)
Rpt ONO on RA prior to visit in october

## 2015-08-13 NOTE — Telephone Encounter (Signed)
Pt called again and gave a different # to be reached [phone].Carrie Chung

## 2015-08-13 NOTE — Telephone Encounter (Signed)
Pt returning call call back @ [phone].Carrie Chung

## 2015-08-13 NOTE — Telephone Encounter (Signed)
lmtcb

## 2015-08-13 NOTE — Telephone Encounter (Signed)
Agree; thx 

## 2015-08-13 NOTE — Telephone Encounter (Signed)
I called spoke with pt. She is scheduled to see RA in October. She reports she thought RA wanted her to have another ONO done prior to her visit. I don't see this mentioned anywhere. Please advise RA thanks

## 2015-08-14 ENCOUNTER — Encounter (HOSPITAL_COMMUNITY)
Admission: RE | Admit: 2015-08-14 | Discharge: 2015-08-14 | Disposition: A | Discharge status: Home / Self Care | Payer: Self-pay | Source: Ambulatory Visit | Attending: Cardiovascular Disease | Admitting: Cardiovascular Disease

## 2015-08-16 ENCOUNTER — Encounter (HOSPITAL_COMMUNITY)
Admission: RE | Admit: 2015-08-16 | Discharge: 2015-08-16 | Disposition: A | Discharge status: Home / Self Care | Payer: Self-pay | Source: Ambulatory Visit | Attending: Cardiovascular Disease | Admitting: Cardiovascular Disease

## 2015-08-19 ENCOUNTER — Encounter (HOSPITAL_COMMUNITY)
Admission: RE | Admit: 2015-08-19 | Discharge: 2015-08-19 | Disposition: A | Discharge status: Home / Self Care | Payer: Self-pay | Source: Ambulatory Visit | Attending: Cardiovascular Disease | Admitting: Cardiovascular Disease

## 2015-08-21 ENCOUNTER — Encounter (HOSPITAL_COMMUNITY): Payer: Self-pay

## 2015-08-23 ENCOUNTER — Encounter (HOSPITAL_COMMUNITY)
Admission: RE | Admit: 2015-08-23 | Discharge: 2015-08-23 | Disposition: A | Discharge status: Home / Self Care | Payer: Self-pay | Source: Ambulatory Visit | Attending: Cardiovascular Disease | Admitting: Cardiovascular Disease

## 2015-08-23 DIAGNOSIS — Z9861 Coronary angioplasty status: Secondary | ICD-10-CM | POA: Insufficient documentation

## 2015-08-23 DIAGNOSIS — I499 Cardiac arrhythmia, unspecified: Secondary | ICD-10-CM | POA: Insufficient documentation

## 2015-08-23 DIAGNOSIS — I252 Old myocardial infarction: Secondary | ICD-10-CM | POA: Insufficient documentation

## 2015-08-23 DIAGNOSIS — I10 Essential (primary) hypertension: Secondary | ICD-10-CM | POA: Insufficient documentation

## 2015-08-23 DIAGNOSIS — E785 Hyperlipidemia, unspecified: Secondary | ICD-10-CM | POA: Insufficient documentation

## 2015-08-23 DIAGNOSIS — Z5189 Encounter for other specified aftercare: Secondary | ICD-10-CM | POA: Insufficient documentation

## 2015-08-23 DIAGNOSIS — I2510 Atherosclerotic heart disease of native coronary artery without angina pectoris: Secondary | ICD-10-CM | POA: Insufficient documentation

## 2015-08-25 ENCOUNTER — Other Ambulatory Visit: Payer: Self-pay | Admitting: Cardiovascular Disease

## 2015-08-28 ENCOUNTER — Encounter (HOSPITAL_COMMUNITY)
Admission: RE | Admit: 2015-08-28 | Discharge: 2015-08-28 | Disposition: A | Discharge status: Home / Self Care | Payer: Self-pay | Source: Ambulatory Visit | Attending: Cardiovascular Disease | Admitting: Cardiovascular Disease

## 2015-08-30 ENCOUNTER — Encounter (HOSPITAL_COMMUNITY): Payer: Self-pay

## 2015-09-02 ENCOUNTER — Encounter (HOSPITAL_COMMUNITY): Payer: Self-pay

## 2015-09-04 ENCOUNTER — Encounter (HOSPITAL_COMMUNITY): Payer: Self-pay

## 2015-09-05 ENCOUNTER — Telehealth: Payer: Self-pay | Admitting: Pulmonary Disease

## 2015-09-05 NOTE — Telephone Encounter (Signed)
Spoke with pt, states that she received a bill from [facility] stating that medicare not cover her 02.  States that medical necessity is needed from RA.  Called [facility], was answered by a call service saying [place] had higher than normal call volumes and took our number to call back.  Will await call.

## 2015-09-06 ENCOUNTER — Encounter (HOSPITAL_COMMUNITY): Payer: Self-pay

## 2015-09-06 NOTE — Telephone Encounter (Signed)
[name] from [facility] returning call - [phone] (her cell)

## 2015-09-06 NOTE — Telephone Encounter (Signed)
Called # for [employer] and line just rings and did not received VM. WCB

## 2015-09-06 NOTE — Telephone Encounter (Signed)
Called [facility], patient needs better diagnosis for the order Hypoxemia will not pay for the O2 CMN will need to be completed. CMN will be faxed. Awaiting fax.

## 2015-09-07 ENCOUNTER — Other Ambulatory Visit: Payer: Self-pay | Admitting: Cardiovascular Disease

## 2015-09-09 ENCOUNTER — Encounter (HOSPITAL_COMMUNITY): Payer: Self-pay

## 2015-09-09 NOTE — Telephone Encounter (Signed)
atc Jessica X2, no answer, no vm.  Wcb.

## 2015-09-11 ENCOUNTER — Encounter (HOSPITAL_COMMUNITY): Payer: Self-pay

## 2015-09-11 NOTE — Telephone Encounter (Signed)
Attempted to contact [name] on her cell number that she provided, phone just kept ringing, no Voicemail picked up. WCB

## 2015-09-13 ENCOUNTER — Encounter (HOSPITAL_COMMUNITY): Admission: RE | Admit: 2015-09-13 | Payer: Self-pay | Source: Ambulatory Visit

## 2015-09-16 ENCOUNTER — Encounter (HOSPITAL_COMMUNITY)
Admission: RE | Admit: 2015-09-16 | Discharge: 2015-09-16 | Disposition: A | Discharge status: Home / Self Care | Payer: Self-pay | Source: Ambulatory Visit | Attending: Cardiovascular Disease | Admitting: Cardiovascular Disease

## 2015-09-16 ENCOUNTER — Ambulatory Visit (INDEPENDENT_AMBULATORY_CARE_PROVIDER_SITE_OTHER): Payer: Medicare Other | Admitting: Internal Medicine

## 2015-09-16 ENCOUNTER — Telehealth: Payer: Self-pay | Admitting: Pulmonary Disease

## 2015-09-16 ENCOUNTER — Encounter: Payer: Self-pay | Admitting: Internal Medicine

## 2015-09-16 VITALS — BP 132/76 | HR 93 | Temp 98.7°F | Ht 61.0 in | Wt 175.0 lb

## 2015-09-16 DIAGNOSIS — I2510 Atherosclerotic heart disease of native coronary artery without angina pectoris: Secondary | ICD-10-CM

## 2015-09-16 DIAGNOSIS — Z23 Encounter for immunization: Secondary | ICD-10-CM

## 2015-09-16 MED ORDER — ALPRAZOLAM 1 MG PO TABS: 1.0000 mg | ORAL_TABLET | Freq: Every evening | ORAL | Status: DC | PRN

## 2015-09-16 MED ORDER — HYDROCODONE-ACETAMINOPHEN 10-325 MG PO TABS: 1.0000 | ORAL_TABLET | Freq: Three times a day (TID) | ORAL | Status: DC | PRN

## 2015-09-16 MED ORDER — DICLOFENAC SODIUM 1 % TD GEL: 2.0000 g | Freq: Two times a day (BID) | TRANSDERMAL | Status: DC

## 2015-09-16 NOTE — Telephone Encounter (Signed)
Spoke with pt, states that [facility] is billing pt for an oxygen.  Pt states the oxygen was denied by insurance d/t not having adequate need for it.  Pt wants to know if she needs to continue wearing her oxygen.    Pt states she's happy to do another ONO if needed to prove her need for 02.    RA please advise.  Thanks!

## 2015-09-16 NOTE — Telephone Encounter (Signed)
[phone]  Pt has her phone on now

## 2015-09-16 NOTE — Telephone Encounter (Signed)
lmtcb

## 2015-09-16 NOTE — Progress Notes (Signed)
   Subjective:    Patient ID: Carrie Chung, female    DOB: 1941/11/02, 74 y.o.   MRN: [id number]  HPI  Patient has been diagnosed with hypoxemia but not frank obstructive sleep apnea. She has nasal cannula apparatus for nighttime. There is some issue with Medicare paying for this. She's trying to resolve it. She's not sleeping well at night. Continues to see Dennison Nancy for counseling which is helped her a great deal. Jerrye Beavers is concerned that she's not sleeping. She's taking Xanax 0.5 mg at bedtime but takes well over now are for her to get to sleep. Insomnia has been a lifelong probably for her. She continues to go to cardiac rehabilitation but did stop for couple of weeks because she was simply too fatigued. Her daughter's expecting a baby in 4-6 weeks. This will be her first grandchild. She did not traveled [place] this summer. She misses her family there.  Continues to have issues from time to time with low back pain and request a refill on Norco.  Continues to be followed by Dr. Burt Knack for coronary artery disease.    Review of Systems     Objective:   Physical Exam  Spent 25 minutes speaking with patient about these issues.      Assessment & Plan:  Chronic insomnia  Hypoxemia  Low back pain  Coronary artery disease  Plan: Refill Norco is requested. Increase Xanax to 1 mg at bedtime and call with progress report in 2 weeks.

## 2015-09-16 NOTE — Patient Instructions (Signed)
Increase Xanax 1 mg at bedtime. Take hydrocodone/APAP sparingly for back pain. Call with progress report on sleep in 2 weeks.

## 2015-09-16 NOTE — Telephone Encounter (Signed)
Pl check with DME - she had nocturnal desatn documented & qualifies for o2

## 2015-09-17 NOTE — Telephone Encounter (Signed)
danielle returning call and can be reached @ [phone] ext [phone] [facility]).Hillery Hunter

## 2015-09-17 NOTE — Telephone Encounter (Signed)
Spoke to [place] - they will review

## 2015-09-17 NOTE — Telephone Encounter (Signed)
This pt does NOT have OSA - AHI 4.5/h Pl tell [facility] to bill as nocturnal hypoxia

## 2015-09-17 NOTE — Telephone Encounter (Signed)
Spoke with Carrie Chung at [facility], states that pt does not qualify for 02 per Medicare guidelines d/t nonqualifying diagnosis-was transferred to a Carrie Chung to have the reasoning behind this explained to me. Was put on hold for 12 minutes and then line disconnected.  Wcb.

## 2015-09-17 NOTE — Telephone Encounter (Signed)
Called and spoke with Carrie Chung from [facility] Was informed that since pt has dx of OSA , pt needs a titrated study done on CPAP to qualify for oxygen that would be covered by Medicare The previous ONO were only done on room air and not sufficient for guidelines  Dr Elsworth Soho, please advise how you would like to proceed. Thanks

## 2015-09-18 ENCOUNTER — Encounter (HOSPITAL_COMMUNITY)
Admission: RE | Admit: 2015-09-18 | Discharge: 2015-09-18 | Disposition: A | Discharge status: Home / Self Care | Payer: Self-pay | Source: Ambulatory Visit | Attending: Cardiovascular Disease | Admitting: Cardiovascular Disease

## 2015-09-18 NOTE — Telephone Encounter (Signed)
Per Melissa at [facility]: --------------------------------------------------------------------------------- This is the patient that Dr. Elsworth Soho spoke to me about earlier today.  I have pulled the sleep study and other documentation that I will submit to our qualification team in an effort to get Medicare to cover O2.   I was not aware of this situation until today. The issue for Medicare was that the patient carried a dx of OSA with no other chronic respiratory dx. Also, if a pt has a dx of OSA, an ONO will not qualify for O2, it must be an in lab titration. According to her last study, she no longer has OSA.   I'll let you know if we have any other problems with this one.  ------------------------------------------------------------------------------------- Patient says that Dr. Renold Genta increased her Xanax at night because she is having trouble without her oxygen.  She said that she slept well on the new dose of Xanax, but she said that she woke up this morning feeling terrible and SOB.    Dr. Elsworth Soho, please advise.

## 2015-09-18 NOTE — Telephone Encounter (Signed)
Pt returning call.Carrie Chung ° °

## 2015-09-18 NOTE — Telephone Encounter (Signed)
Please let her know that [facility] Will renew her oxygen -I have spoken to them

## 2015-09-18 NOTE — Telephone Encounter (Signed)
lmtcb x1 for pt. 

## 2015-09-18 NOTE — Telephone Encounter (Signed)
lmtcb

## 2015-09-19 ENCOUNTER — Encounter: Payer: Self-pay | Admitting: *Deleted

## 2015-09-19 DIAGNOSIS — J9611 Chronic respiratory failure with hypoxia: Secondary | ICD-10-CM | POA: Insufficient documentation

## 2015-09-19 NOTE — Telephone Encounter (Signed)
Spoke with the pt and notified of recs per RA  She verbalized understanding  Nothing further needed 

## 2015-09-20 ENCOUNTER — Encounter (HOSPITAL_COMMUNITY)
Admission: RE | Admit: 2015-09-20 | Discharge: 2015-09-20 | Disposition: A | Discharge status: Home / Self Care | Payer: Self-pay | Source: Ambulatory Visit | Attending: Cardiovascular Disease | Admitting: Cardiovascular Disease

## 2015-09-23 ENCOUNTER — Encounter (HOSPITAL_COMMUNITY)
Admission: RE | Admit: 2015-09-23 | Discharge: 2015-09-23 | Disposition: A | Discharge status: Home / Self Care | Payer: Self-pay | Source: Ambulatory Visit | Attending: Cardiovascular Disease | Admitting: Cardiovascular Disease

## 2015-09-23 DIAGNOSIS — I2510 Atherosclerotic heart disease of native coronary artery without angina pectoris: Secondary | ICD-10-CM | POA: Insufficient documentation

## 2015-09-23 DIAGNOSIS — I10 Essential (primary) hypertension: Secondary | ICD-10-CM | POA: Insufficient documentation

## 2015-09-23 DIAGNOSIS — E785 Hyperlipidemia, unspecified: Secondary | ICD-10-CM | POA: Insufficient documentation

## 2015-09-23 DIAGNOSIS — I252 Old myocardial infarction: Secondary | ICD-10-CM | POA: Insufficient documentation

## 2015-09-23 DIAGNOSIS — Z9861 Coronary angioplasty status: Secondary | ICD-10-CM | POA: Insufficient documentation

## 2015-09-23 DIAGNOSIS — Z5189 Encounter for other specified aftercare: Secondary | ICD-10-CM | POA: Insufficient documentation

## 2015-09-23 DIAGNOSIS — I499 Cardiac arrhythmia, unspecified: Secondary | ICD-10-CM | POA: Insufficient documentation

## 2015-09-25 ENCOUNTER — Encounter (HOSPITAL_COMMUNITY)
Admission: RE | Admit: 2015-09-25 | Discharge: 2015-09-25 | Disposition: A | Discharge status: Home / Self Care | Payer: Self-pay | Source: Ambulatory Visit | Attending: Cardiovascular Disease | Admitting: Cardiovascular Disease

## 2015-09-27 ENCOUNTER — Encounter (HOSPITAL_COMMUNITY)
Admission: RE | Admit: 2015-09-27 | Discharge: 2015-09-27 | Disposition: A | Discharge status: Home / Self Care | Payer: Self-pay | Source: Ambulatory Visit | Attending: Cardiovascular Disease | Admitting: Cardiovascular Disease

## 2015-09-27 ENCOUNTER — Other Ambulatory Visit: Payer: Self-pay | Admitting: Cardiovascular Disease

## 2015-09-30 ENCOUNTER — Encounter (HOSPITAL_COMMUNITY)
Admission: RE | Admit: 2015-09-30 | Discharge: 2015-09-30 | Disposition: A | Discharge status: Home / Self Care | Payer: Self-pay | Source: Ambulatory Visit | Attending: Cardiovascular Disease | Admitting: Cardiovascular Disease

## 2015-10-02 ENCOUNTER — Encounter (HOSPITAL_COMMUNITY): Payer: Self-pay

## 2015-10-03 ENCOUNTER — Other Ambulatory Visit: Payer: Self-pay | Admitting: *Deleted

## 2015-10-03 MED ORDER — FA-PYRIDOXINE-CYANOCOBALAMIN 2.5-25-2 MG PO TABS: 1.0000 | ORAL_TABLET | Freq: Every day | ORAL | Status: DC

## 2015-10-04 ENCOUNTER — Encounter (HOSPITAL_COMMUNITY)
Admission: RE | Admit: 2015-10-04 | Discharge: 2015-10-04 | Disposition: A | Discharge status: Home / Self Care | Payer: Self-pay | Source: Ambulatory Visit | Attending: Cardiovascular Disease | Admitting: Cardiovascular Disease

## 2015-10-07 ENCOUNTER — Encounter (HOSPITAL_COMMUNITY)
Admission: RE | Admit: 2015-10-07 | Discharge: 2015-10-07 | Disposition: A | Discharge status: Home / Self Care | Payer: Self-pay | Source: Ambulatory Visit | Attending: Cardiovascular Disease | Admitting: Cardiovascular Disease

## 2015-10-09 ENCOUNTER — Encounter (HOSPITAL_COMMUNITY)
Admission: RE | Admit: 2015-10-09 | Discharge: 2015-10-09 | Disposition: A | Discharge status: Home / Self Care | Payer: Self-pay | Source: Ambulatory Visit | Attending: Cardiovascular Disease | Admitting: Cardiovascular Disease

## 2015-10-11 ENCOUNTER — Encounter (HOSPITAL_COMMUNITY): Payer: Self-pay

## 2015-10-14 ENCOUNTER — Encounter (HOSPITAL_COMMUNITY)
Admission: RE | Admit: 2015-10-14 | Discharge: 2015-10-14 | Disposition: A | Discharge status: Home / Self Care | Payer: Self-pay | Source: Ambulatory Visit | Attending: Cardiovascular Disease | Admitting: Cardiovascular Disease

## 2015-10-14 ENCOUNTER — Telehealth: Payer: Self-pay | Admitting: Pulmonary Disease

## 2015-10-14 NOTE — Telephone Encounter (Signed)
Pt notified of ONO results. Pt verbalized understanding. Nothing further needed.

## 2015-10-14 NOTE — Telephone Encounter (Signed)
Sat <88% x 402 mins. Stay on O2 during sleep.

## 2015-10-15 ENCOUNTER — Other Ambulatory Visit: Payer: Self-pay | Admitting: Cardiovascular Disease

## 2015-10-16 ENCOUNTER — Ambulatory Visit (INDEPENDENT_AMBULATORY_CARE_PROVIDER_SITE_OTHER): Payer: Medicare Other | Admitting: Pulmonary Disease

## 2015-10-16 ENCOUNTER — Encounter: Payer: Self-pay | Admitting: Pulmonary Disease

## 2015-10-16 ENCOUNTER — Encounter (HOSPITAL_COMMUNITY): Payer: Self-pay

## 2015-10-16 VITALS — BP 142/70 | HR 72 | Ht 61.5 in | Wt 177.6 lb

## 2015-10-16 DIAGNOSIS — J9611 Chronic respiratory failure with hypoxia: Secondary | ICD-10-CM | POA: Diagnosis not present

## 2015-10-16 DIAGNOSIS — I2510 Atherosclerotic heart disease of native coronary artery without angina pectoris: Secondary | ICD-10-CM | POA: Diagnosis not present

## 2015-10-16 NOTE — Progress Notes (Signed)
   Subjective:    Patient ID: Carrie Chung, female    DOB: [birth year], 74 y.o.   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 She is in maintenance cardiac rehabilitation. She has noted herself to desaturate after exertion as low as 91%. PSG 04/2015 (dohmeier)- this does not show significant OSA -AHI 4.5, RDI 7.6/hwhich showed desaturation for 106 minutes. She has restless leg syndrome which is improved off Wellbutrin   Chief Complaint  Patient presents with  . Follow-up    Discuss ONO results in more detail;  Oxygen drying out nose and throat.  having trouble with allergies right now, congested  using inhaler more often, Gets out of breath when walking up/down stairs.  cough is not better.    She is able to continue with cardiac rehabilitation Does feel dyspneic on occasion, continues to have a dry cough No pedal edema or orthopnea She is planning a trip to [place] which she had to postpone  Significant tests/ events  03/2012 echo normal EF 04/2014 exercise stress test normal 04/2014, 05/2015  CXR normal ONO on RA 09/2015 -  Continues to desaturate x 400 minutes PFT that showed nml lung function. No airflow obstruction/restriction. DLCO nml .  CT angio chest neg for PE, mild emphysema .  Bubble echo with gg 1 DD , EF ok , no shunt.   Review of Systems neg for any significant sore throat, dysphagia, itching, sneezing, nasal congestion or excess/ purulent secretions, fever, chills, sweats, unintended wt loss, pleuritic or exertional cp, hempoptysis, orthopnea pnd or change in chronic leg  swelling. Also denies presyncope, palpitations, heartburn, abdominal pain, nausea, vomiting, diarrhea or change in bowel or urinary habits, dysuria,hematuria, rash, arthralgias, visual complaints, headache, numbness weakness or ataxia.     Objective:   Physical Exam  Gen. Pleasant, well-nourished, in no distress ENT - no lesions, no post nasal drip Neck: No JVD, no thyromegaly, no carotid bruits Lungs: no use of accessory muscles, no dullness to percussion, clear without rales or rhonchi  Cardiovascular: Rhythm regular, heart sounds  normal, no murmurs or gallops, no peripheral edema Musculoskeletal: No deformities, no cyanosis or clubbing        Assessment & Plan:

## 2015-10-16 NOTE — Patient Instructions (Signed)
Continue on oxygen Call us if you need a letter for trip to [place] CT scan next year

## 2015-10-16 NOTE — Assessment & Plan Note (Signed)
Continue on oxygen Call us if you need a letter for trip to [place] CT scan next year

## 2015-10-18 ENCOUNTER — Encounter (HOSPITAL_COMMUNITY)
Admission: RE | Admit: 2015-10-18 | Discharge: 2015-10-18 | Disposition: A | Discharge status: Home / Self Care | Payer: Self-pay | Source: Ambulatory Visit | Attending: Cardiovascular Disease | Admitting: Cardiovascular Disease

## 2015-10-21 ENCOUNTER — Encounter (HOSPITAL_COMMUNITY): Payer: Self-pay

## 2015-10-23 ENCOUNTER — Encounter (HOSPITAL_COMMUNITY): Payer: Medicare Other

## 2015-10-23 DIAGNOSIS — I499 Cardiac arrhythmia, unspecified: Secondary | ICD-10-CM | POA: Insufficient documentation

## 2015-10-23 DIAGNOSIS — I2510 Atherosclerotic heart disease of native coronary artery without angina pectoris: Secondary | ICD-10-CM | POA: Insufficient documentation

## 2015-10-23 DIAGNOSIS — Z5189 Encounter for other specified aftercare: Secondary | ICD-10-CM | POA: Insufficient documentation

## 2015-10-23 DIAGNOSIS — Z9861 Coronary angioplasty status: Secondary | ICD-10-CM | POA: Insufficient documentation

## 2015-10-23 DIAGNOSIS — E785 Hyperlipidemia, unspecified: Secondary | ICD-10-CM | POA: Insufficient documentation

## 2015-10-23 DIAGNOSIS — I252 Old myocardial infarction: Secondary | ICD-10-CM | POA: Insufficient documentation

## 2015-10-23 DIAGNOSIS — I10 Essential (primary) hypertension: Secondary | ICD-10-CM | POA: Insufficient documentation

## 2015-10-25 ENCOUNTER — Encounter (HOSPITAL_COMMUNITY): Payer: Self-pay

## 2015-10-28 ENCOUNTER — Encounter (HOSPITAL_COMMUNITY): Payer: Self-pay

## 2015-10-30 ENCOUNTER — Encounter (HOSPITAL_COMMUNITY): Payer: Self-pay

## 2015-10-31 ENCOUNTER — Encounter: Payer: Self-pay | Admitting: Pulmonary Disease

## 2015-11-01 ENCOUNTER — Encounter (HOSPITAL_COMMUNITY): Payer: Self-pay

## 2015-11-04 ENCOUNTER — Encounter (HOSPITAL_COMMUNITY): Payer: Self-pay

## 2015-11-05 ENCOUNTER — Telehealth: Payer: Self-pay | Admitting: Pulmonary Disease

## 2015-11-05 NOTE — Telephone Encounter (Signed)
Spoke with Hinton Dyer with [facility]. Needs all OV notes on patient for Medicare reimbursement. These have been faxed to her. Nothing further was needed.

## 2015-11-06 ENCOUNTER — Encounter (HOSPITAL_COMMUNITY)
Admission: RE | Admit: 2015-11-06 | Discharge: 2015-11-06 | Disposition: A | Discharge status: Home / Self Care | Payer: Self-pay | Source: Ambulatory Visit | Attending: Cardiovascular Disease | Admitting: Cardiovascular Disease

## 2015-11-08 ENCOUNTER — Encounter (HOSPITAL_COMMUNITY)
Admission: RE | Admit: 2015-11-08 | Discharge: 2015-11-08 | Disposition: A | Discharge status: Home / Self Care | Payer: Self-pay | Source: Ambulatory Visit | Attending: Cardiovascular Disease | Admitting: Cardiovascular Disease

## 2015-11-11 ENCOUNTER — Encounter (HOSPITAL_COMMUNITY)
Admission: RE | Admit: 2015-11-11 | Discharge: 2015-11-11 | Disposition: A | Discharge status: Home / Self Care | Payer: Self-pay | Source: Ambulatory Visit | Attending: Cardiovascular Disease | Admitting: Cardiovascular Disease

## 2015-11-13 ENCOUNTER — Encounter (HOSPITAL_COMMUNITY)
Admission: RE | Admit: 2015-11-13 | Discharge: 2015-11-13 | Disposition: A | Discharge status: Home / Self Care | Payer: Self-pay | Source: Ambulatory Visit | Attending: Cardiovascular Disease | Admitting: Cardiovascular Disease

## 2015-11-17 ENCOUNTER — Other Ambulatory Visit: Payer: Self-pay | Admitting: Internal Medicine

## 2015-11-18 ENCOUNTER — Encounter (HOSPITAL_COMMUNITY): Payer: Self-pay

## 2015-11-18 NOTE — Telephone Encounter (Signed)
Phoned to [facility] 

## 2015-11-18 NOTE — Telephone Encounter (Signed)
Refill once 

## 2015-11-19 ENCOUNTER — Other Ambulatory Visit: Payer: Self-pay | Admitting: Internal Medicine

## 2015-11-19 NOTE — Telephone Encounter (Signed)
I have received this once over the weekend. I believe we agreed to fill it once only

## 2015-11-20 ENCOUNTER — Encounter (HOSPITAL_COMMUNITY): Payer: Self-pay

## 2015-11-22 ENCOUNTER — Encounter (HOSPITAL_COMMUNITY): Payer: Medicare Other

## 2015-11-22 DIAGNOSIS — I499 Cardiac arrhythmia, unspecified: Secondary | ICD-10-CM | POA: Insufficient documentation

## 2015-11-22 DIAGNOSIS — I2510 Atherosclerotic heart disease of native coronary artery without angina pectoris: Secondary | ICD-10-CM | POA: Insufficient documentation

## 2015-11-22 DIAGNOSIS — I252 Old myocardial infarction: Secondary | ICD-10-CM | POA: Insufficient documentation

## 2015-11-22 DIAGNOSIS — I10 Essential (primary) hypertension: Secondary | ICD-10-CM | POA: Insufficient documentation

## 2015-11-22 DIAGNOSIS — Z9861 Coronary angioplasty status: Secondary | ICD-10-CM | POA: Insufficient documentation

## 2015-11-22 DIAGNOSIS — Z5189 Encounter for other specified aftercare: Secondary | ICD-10-CM | POA: Insufficient documentation

## 2015-11-22 DIAGNOSIS — E785 Hyperlipidemia, unspecified: Secondary | ICD-10-CM | POA: Insufficient documentation

## 2015-11-25 ENCOUNTER — Encounter (HOSPITAL_COMMUNITY): Payer: Self-pay

## 2015-11-27 ENCOUNTER — Encounter (HOSPITAL_COMMUNITY): Payer: Self-pay

## 2015-11-29 ENCOUNTER — Encounter (HOSPITAL_COMMUNITY)
Admission: RE | Admit: 2015-11-29 | Discharge: 2015-11-29 | Disposition: A | Discharge status: Home / Self Care | Payer: Self-pay | Source: Ambulatory Visit | Attending: Cardiovascular Disease | Admitting: Cardiovascular Disease

## 2015-12-02 ENCOUNTER — Encounter (HOSPITAL_COMMUNITY)
Admission: RE | Admit: 2015-12-02 | Discharge: 2015-12-02 | Disposition: A | Discharge status: Home / Self Care | Payer: Self-pay | Source: Ambulatory Visit | Attending: Cardiovascular Disease | Admitting: Cardiovascular Disease

## 2015-12-04 ENCOUNTER — Other Ambulatory Visit: Payer: Self-pay | Admitting: Cardiovascular Disease

## 2015-12-04 ENCOUNTER — Encounter (HOSPITAL_COMMUNITY)
Admission: RE | Admit: 2015-12-04 | Discharge: 2015-12-04 | Disposition: A | Discharge status: Home / Self Care | Payer: Self-pay | Source: Ambulatory Visit | Attending: Cardiovascular Disease | Admitting: Cardiovascular Disease

## 2015-12-06 ENCOUNTER — Encounter (HOSPITAL_COMMUNITY): Payer: Self-pay

## 2015-12-09 ENCOUNTER — Other Ambulatory Visit: Payer: Self-pay | Admitting: Internal Medicine

## 2015-12-09 ENCOUNTER — Encounter (HOSPITAL_COMMUNITY)
Admission: RE | Admit: 2015-12-09 | Discharge: 2015-12-09 | Disposition: A | Discharge status: Home / Self Care | Payer: Self-pay | Source: Ambulatory Visit | Attending: Cardiovascular Disease | Admitting: Cardiovascular Disease

## 2015-12-11 ENCOUNTER — Encounter (HOSPITAL_COMMUNITY): Admission: RE | Admit: 2015-12-11 | Payer: Self-pay | Source: Ambulatory Visit

## 2015-12-13 ENCOUNTER — Encounter (HOSPITAL_COMMUNITY)
Admission: RE | Admit: 2015-12-13 | Discharge: 2015-12-13 | Disposition: A | Discharge status: Home / Self Care | Payer: Self-pay | Source: Ambulatory Visit | Attending: Cardiovascular Disease | Admitting: Cardiovascular Disease

## 2015-12-17 ENCOUNTER — Other Ambulatory Visit: Payer: Self-pay | Admitting: Internal Medicine

## 2015-12-17 NOTE — Telephone Encounter (Signed)
Refill once 

## 2015-12-18 ENCOUNTER — Encounter (HOSPITAL_COMMUNITY): Payer: Self-pay

## 2015-12-18 NOTE — Telephone Encounter (Signed)
Phoned to [facility] 

## 2015-12-20 ENCOUNTER — Encounter (HOSPITAL_COMMUNITY)
Admission: RE | Admit: 2015-12-20 | Discharge: 2015-12-20 | Disposition: A | Discharge status: Home / Self Care | Payer: Self-pay | Source: Ambulatory Visit | Attending: Cardiovascular Disease | Admitting: Cardiovascular Disease

## 2015-12-25 ENCOUNTER — Encounter (HOSPITAL_COMMUNITY): Payer: Medicare Other

## 2015-12-25 DIAGNOSIS — I2510 Atherosclerotic heart disease of native coronary artery without angina pectoris: Secondary | ICD-10-CM | POA: Insufficient documentation

## 2015-12-25 DIAGNOSIS — E785 Hyperlipidemia, unspecified: Secondary | ICD-10-CM | POA: Insufficient documentation

## 2015-12-25 DIAGNOSIS — I499 Cardiac arrhythmia, unspecified: Secondary | ICD-10-CM | POA: Insufficient documentation

## 2015-12-25 DIAGNOSIS — I10 Essential (primary) hypertension: Secondary | ICD-10-CM | POA: Insufficient documentation

## 2015-12-25 DIAGNOSIS — Z9861 Coronary angioplasty status: Secondary | ICD-10-CM | POA: Insufficient documentation

## 2015-12-25 DIAGNOSIS — Z5189 Encounter for other specified aftercare: Secondary | ICD-10-CM | POA: Insufficient documentation

## 2015-12-25 DIAGNOSIS — I252 Old myocardial infarction: Secondary | ICD-10-CM | POA: Insufficient documentation

## 2015-12-26 ENCOUNTER — Ambulatory Visit (INDEPENDENT_AMBULATORY_CARE_PROVIDER_SITE_OTHER): Payer: Medicare Other | Admitting: Cardiovascular Disease

## 2015-12-26 ENCOUNTER — Encounter: Payer: Self-pay | Admitting: Cardiovascular Disease

## 2015-12-26 VITALS — BP 158/88 | HR 70 | Ht 61.5 in | Wt 169.0 lb

## 2015-12-26 DIAGNOSIS — E785 Hyperlipidemia, unspecified: Secondary | ICD-10-CM

## 2015-12-26 DIAGNOSIS — I2510 Atherosclerotic heart disease of native coronary artery without angina pectoris: Secondary | ICD-10-CM

## 2015-12-26 MED ORDER — HYDROCHLOROTHIAZIDE 25 MG PO TABS: 25.0000 mg | ORAL_TABLET | Freq: Every day | ORAL | Status: DC

## 2015-12-26 NOTE — Patient Instructions (Signed)
Medication Instructions:  Your physician has recommended you make the following change in your medication:  1. INCREASE HCTZ to 25mg  take one tablet by mouth daily  Labwork: Your physician recommends that you return for a FASTING LIPID and CMP in 6 MONTHS--nothing to eat or drink after midnight, lab opens at 7:30 AM  Testing/Procedures: No new orders.   Follow-Up: Your physician wants you to follow-up in: 6 MONTHS with Dr Burt Knack.  You will receive a reminder letter in the mail two months in advance. If you don't receive a letter, please call our office to schedule the follow-up appointment.   Any Other Special Instructions Will Be Listed Below (If Applicable).     If you need a refill on your cardiac medications before your next appointment, please call your pharmacy.

## 2015-12-26 NOTE — Progress Notes (Signed)
Cardiology Office Note Date:  12/27/2015   ID:  Raeanne, Deschler 1941-07-29, MRN [id number]  PCP:  [name], MD  Cardiologist:  [name], MD    Chief Complaint  Patient presents with  . Shortness of Breath    History of Present Illness: Carrie Chung is a 74 y.o. female who presents for follow-up evaluation.   The patient is followed for coronary artery disease. She underwent multivessel stenting in April 2013 after presenting with a non-ST elevation MI. She was treated with stenting of the right coronary artery initially followed by staged PCI of the LAD and left circumflex. She was treated with drug-eluting stents. Her left ventricular function was normal with an EF of 60%. She had a stress Myoview scan in December 2013 and this showed normal perfusion and normal LV function. She again presented with chest pain in May 2015 and was evaluated with observation in the hospital. She ruled out for myocardial infarction and underwent a stress echocardiogram demonstrating no ischemic changes.  She has been evaluated for hypoxemia over the past year (Dr Elsworth Soho). No abnormalities have been found. Has undergone overnight sleep study, CTA chest, echo with bubble study. To date no significant abnormalities found. She is wearing nocturnal O2.   Overall doing ok. No recent chest pain or pressure. She does complain of exertional dyspnea and fatigue. No orthopnea or PND.   Past Medical History  Diagnosis Date  . Hypokalemia   . HTN (hypertension)   . Sleep apnea   . Endometrial polyp   . CAD (coronary artery disease)     a. NSTEMI 03/2012 ([facility] 03/27/12: pLAD 30%, mLAD 99%, mCFX 95%, mRCA 99% with thrombus, EF 65%);  s/p PTCA/DESx1 to prox-mid RCA 03/27/12 urgently in setting of hypotension/bradycardia, with staged PTCA/Evolve study stent to mid LAD & PTCA/Evolve study stent to prox LCx 03/29/12 ;   echo 03/28/12:EF 60%, Aortic sclerosis without AS, mild RVE, mild reduced RVSF    .  Hyperglycemia   . Hyperlipidemia   . Atrial tachycardia ([place])     a. noted at cardiac rehab 5/13; event monitor ordered to assess for AFib  . MI, old     Past Surgical History  Procedure Laterality Date  . Cholecystectomy  1978  . Appendectomy  75 years old  . Tonsillectomy  75 years old  . Cesarean section  1984  . Carotid stent  04/2012    x3  . Left heart catheterization with coronary angiogram N/A 03/27/2012    Procedure: LEFT HEART CATHETERIZATION WITH CORONARY ANGIOGRAM;  Surgeon: Burnell Blanks, MD;  Location: [facility] CATH LAB;  Service: Cardiovascular;  Laterality: N/A;  . Percutaneous coronary stent intervention (pci-s) N/A 03/27/2012    Procedure: PERCUTANEOUS CORONARY STENT INTERVENTION (PCI-S);  Surgeon: Burnell Blanks, MD;  Location: [facility] CATH LAB;  Service: Cardiovascular;  Laterality: N/A;  . Percutaneous coronary stent intervention (pci-s) N/A 03/29/2012    Procedure: PERCUTANEOUS CORONARY STENT INTERVENTION (PCI-S);  Surgeon: [name], MD;  Location: [facility] CATH LAB;  Service: Cardiovascular;  Laterality: N/A;    Current Outpatient Prescriptions  Medication Sig Dispense Refill  . albuterol (PROVENTIL HFA;VENTOLIN HFA) 108 (90 BASE) MCG/ACT inhaler Inhale 2 puffs into the lungs every 6 (six) hours as needed for wheezing or shortness of breath. 1 Inhaler 2  . ALPRAZolam (XANAX) 1 MG tablet TAKE 1 TABLET BY MOUTH AT BEDTIME AS NEEDED 30 tablet 0  . amLODipine (NORVASC) 5 MG tablet TAKE 1 TABLET (5 MG TOTAL)  BY MOUTH DAILY. 30 tablet 6  . aspirin 81 MG tablet Take 1 tablet (81 mg total) by mouth daily.    . Calcium Carbonate-Vitamin D (CALTRATE 600+D) 600-400 MG-UNIT per tablet Take 1 tablet by mouth 2 (two) times daily.     . clopidogrel (PLAVIX) 75 MG tablet TAKE 1 TABLET (75 MG TOTAL) BY MOUTH DAILY. 90 tablet 0  . CRESTOR 10 MG tablet TAKE 1 TABLET (10 MG TOTAL) BY MOUTH DAILY. 90 tablet 3  . diclofenac sodium (VOLTAREN) 1 % GEL Apply 2 g topically 2 (two) times  daily. 100 g 1  . esomeprazole (NEXIUM) 40 MG capsule Take 40 mg by mouth daily at 12 noon.    . folic acid-pyridoxine-cyancobalamin (FOLBIC) 2.5-25-2 MG TABS tablet Take 1 tablet by mouth daily. 90 tablet 3  . hydrochlorothiazide (HYDRODIURIL) 25 MG tablet Take 1 tablet (25 mg total) by mouth daily. 90 tablet 3  . HYDROcodone-acetaminophen (NORCO) 10-325 MG per tablet Take 1 tablet by mouth every 8 (eight) hours as needed for severe pain. 90 tablet 0  . metoprolol (LOPRESSOR) 50 MG tablet Take 50 mg by mouth 2 (two) times daily.    . NITROSTAT 0.4 MG SL tablet PLACE 1 TAB UNDER TONGUE EVERY 5 MINUTES FOR UP TO 3 DOSES AS NEEDED FOR CHEST PAIN 25 tablet 1  . NON FORMULARY Oxygen 2L at night    . PRISTIQ 50 MG 24 hr tablet TAKE 1 TABLET BY MOUTH DAILY 30 tablet 5   No current facility-administered medications for this visit.   Allergies:   Penicillins; Sulfonamide derivatives; and Zinc oxide   Social History:  The patient  reports that she quit smoking about 35 years ago. Her smoking use included Cigarettes. She has never used smokeless tobacco. She reports that she drinks about 12.0 oz of alcohol per week. She reports that she does not use illicit drugs.   Family History:  The patient's  family history includes Cancer in her father; Coronary artery disease in her mother.    ROS:  Please see the history of present illness.  All other systems are reviewed and negative.    PHYSICAL EXAM: VS:  BP 158/88 mmHg  Pulse 70  Ht 5' 1.5" (1.562 m)  Wt 169 lb (76.658 kg)  BMI 31.42 kg/m2 , BMI Body mass index is 31.42 kg/(m^2). GEN: Well nourished, well developed, in no acute distress HEENT: normal Neck: no JVD, no masses. No carotid bruits Cardiac: RRR without murmur or gallop                Respiratory:  clear to auscultation bilaterally, normal work of breathing GI: soft, nontender, nondistended, + BS MS: no deformity or atrophy Ext: no pretibial edema, pedal pulses 2+= bilaterally Skin:  warm and dry, no rash Neuro:  Strength and sensation are intact Psych: euthymic mood, full affect  EKG:  EKG is ordered today. The ekg ordered today shows NSR 70 bpm, occasional PVC, nonspecific T wave changes  Recent Labs: 01/25/2015: ALT 15; Hemoglobin 12.4; Platelets 178; TSH 2.544 06/21/2015: BUN 11; Creatinine, Ser 0.81; Potassium 4.1; Sodium 139   Lipid Panel     Component Value Date/Time   CHOL 130 01/25/2015 0914   TRIG 110 01/25/2015 0914   HDL 61 01/25/2015 0914   CHOLHDL 2.1 01/25/2015 0914   VLDL 22 01/25/2015 0914   LDLCALC 47 01/25/2015 0914   LDLDIRECT 152.4 03/09/2012 0900      Wt Readings from Last 3 Encounters:  12/26/15   169 lb (76.658 kg)  10/16/15 177 lb 9.6 oz (80.559 kg)  09/16/15 175 lb (79.379 kg)     Cardiac Studies Reviewed: CXR 6.15.2016: IMPRESSION: No active cardiopulmonary disease.  CTA Chest 7.1.2016: IMPRESSION: 1. No evidence of a pulmonary embolus. 2. No acute findings. 3. Mild centrilobular emphysema. Mild dependent and lung base subsegmental atelectasis. No evidence of pneumonia or pulmonary edema.  Echo with agitated saline 6.29.2016: Study Conclusions  - Left ventricle: The cavity size was normal. Wall thickness was normal. Systolic function was normal. The estimated ejection fraction was in the range of 60% to 65%. Wall motion was normal; there were no regional wall motion abnormalities. Doppler parameters are consistent with abnormal left ventricular relaxation (grade 1 diastolic dysfunction). - Mitral valve: Calcified annulus. Normal thickness leaflets . - Atrial septum: No defect or patent foramen ovale was identified. Echo contrast study showed no right-to-left atrial level shunt, at baseline or with provocation.  ASSESSMENT AND PLAN: 1.  CAD, native vessel: no recurrence of angina after multivessel stenting several years ago. Need to follow closely with exertional dyspnea and fatigue. Continue current CV  meds which are reviewed today.  2. Shortness of breath/hypoxemia: evaluation reviewed to date. Probably a component of diastolic dysfunction. See below for BP control.   3. HTN, essential: uncontrolled. Increase HCTZ to 25 mg daily. Follow-up labs in 6 months  4. Hyperlipidemia: last labs reviewed. Continue statin drug. Repeat labs at time of FU in 6 months  Current medicines are reviewed with the patient today.  The patient does not have concerns regarding medicines.  Labs/ tests ordered today include:   Orders Placed This Encounter  Procedures  . Comp Met (CMET)  . Lipid panel  . EKG 12-Lead    Disposition:   FU 6 months  Signed, , , MD  12/27/2015 7:25 PM     [facility] [address], [place], [place]  [address] Phone: [phone]; Fax: [phone]   

## 2015-12-27 ENCOUNTER — Encounter (HOSPITAL_COMMUNITY)
Admission: RE | Admit: 2015-12-27 | Discharge: 2015-12-27 | Disposition: A | Discharge status: Home / Self Care | Payer: Self-pay | Source: Ambulatory Visit | Attending: Cardiovascular Disease | Admitting: Cardiovascular Disease

## 2015-12-30 ENCOUNTER — Encounter (HOSPITAL_COMMUNITY): Payer: Self-pay

## 2016-01-01 ENCOUNTER — Encounter (HOSPITAL_COMMUNITY): Payer: Self-pay

## 2016-01-01 ENCOUNTER — Other Ambulatory Visit: Payer: Self-pay | Admitting: Cardiovascular Disease

## 2016-01-03 ENCOUNTER — Encounter (HOSPITAL_COMMUNITY): Payer: Self-pay

## 2016-01-06 ENCOUNTER — Encounter (HOSPITAL_COMMUNITY): Payer: Self-pay

## 2016-01-06 ENCOUNTER — Telehealth: Payer: Self-pay

## 2016-01-06 NOTE — Telephone Encounter (Signed)
Prior auth for brand Crestor sent to [facility].

## 2016-01-07 ENCOUNTER — Telehealth: Payer: Self-pay

## 2016-01-07 NOTE — Telephone Encounter (Signed)
Crestor 10mg  approved by [facility] through 01/05/2018.

## 2016-01-08 ENCOUNTER — Encounter (HOSPITAL_COMMUNITY): Payer: Self-pay

## 2016-01-10 ENCOUNTER — Encounter (HOSPITAL_COMMUNITY): Payer: Self-pay

## 2016-01-13 ENCOUNTER — Encounter (HOSPITAL_COMMUNITY)
Admission: RE | Admit: 2016-01-13 | Discharge: 2016-01-13 | Disposition: A | Discharge status: Home / Self Care | Payer: Self-pay | Source: Ambulatory Visit | Attending: Cardiovascular Disease | Admitting: Cardiovascular Disease

## 2016-01-15 ENCOUNTER — Encounter (HOSPITAL_COMMUNITY)
Admission: RE | Admit: 2016-01-15 | Discharge: 2016-01-15 | Disposition: A | Discharge status: Home / Self Care | Payer: Self-pay | Source: Ambulatory Visit | Attending: Cardiovascular Disease | Admitting: Cardiovascular Disease

## 2016-01-17 ENCOUNTER — Encounter (HOSPITAL_COMMUNITY): Payer: Self-pay

## 2016-01-20 ENCOUNTER — Encounter (HOSPITAL_COMMUNITY)
Admission: RE | Admit: 2016-01-20 | Discharge: 2016-01-20 | Disposition: A | Discharge status: Home / Self Care | Payer: Self-pay | Source: Ambulatory Visit | Attending: Cardiovascular Disease | Admitting: Cardiovascular Disease

## 2016-01-22 ENCOUNTER — Encounter (HOSPITAL_COMMUNITY)
Admission: RE | Admit: 2016-01-22 | Discharge: 2016-01-22 | Disposition: A | Discharge status: Home / Self Care | Payer: Self-pay | Source: Ambulatory Visit | Attending: Cardiovascular Disease | Admitting: Cardiovascular Disease

## 2016-01-22 DIAGNOSIS — E785 Hyperlipidemia, unspecified: Secondary | ICD-10-CM | POA: Insufficient documentation

## 2016-01-22 DIAGNOSIS — Z5189 Encounter for other specified aftercare: Secondary | ICD-10-CM | POA: Insufficient documentation

## 2016-01-22 DIAGNOSIS — I252 Old myocardial infarction: Secondary | ICD-10-CM | POA: Insufficient documentation

## 2016-01-22 DIAGNOSIS — I499 Cardiac arrhythmia, unspecified: Secondary | ICD-10-CM | POA: Insufficient documentation

## 2016-01-22 DIAGNOSIS — I10 Essential (primary) hypertension: Secondary | ICD-10-CM | POA: Insufficient documentation

## 2016-01-22 DIAGNOSIS — I2510 Atherosclerotic heart disease of native coronary artery without angina pectoris: Secondary | ICD-10-CM | POA: Insufficient documentation

## 2016-01-22 DIAGNOSIS — Z9861 Coronary angioplasty status: Secondary | ICD-10-CM | POA: Insufficient documentation

## 2016-01-24 ENCOUNTER — Encounter (HOSPITAL_COMMUNITY)
Admission: RE | Admit: 2016-01-24 | Discharge: 2016-01-24 | Disposition: A | Discharge status: Home / Self Care | Payer: Self-pay | Source: Ambulatory Visit | Attending: Cardiovascular Disease | Admitting: Cardiovascular Disease

## 2016-01-24 ENCOUNTER — Other Ambulatory Visit: Payer: Self-pay | Admitting: Urology

## 2016-01-24 DIAGNOSIS — N281 Cyst of kidney, acquired: Secondary | ICD-10-CM

## 2016-01-27 ENCOUNTER — Encounter (HOSPITAL_COMMUNITY)
Admission: RE | Admit: 2016-01-27 | Discharge: 2016-01-27 | Disposition: A | Discharge status: Home / Self Care | Payer: Self-pay | Source: Ambulatory Visit | Attending: Cardiovascular Disease | Admitting: Cardiovascular Disease

## 2016-01-27 ENCOUNTER — Other Ambulatory Visit: Payer: Self-pay | Admitting: Cardiovascular Disease

## 2016-01-29 ENCOUNTER — Encounter (HOSPITAL_COMMUNITY): Payer: Self-pay

## 2016-01-31 ENCOUNTER — Encounter (HOSPITAL_COMMUNITY)
Admission: RE | Admit: 2016-01-31 | Discharge: 2016-01-31 | Disposition: A | Discharge status: Home / Self Care | Payer: Self-pay | Source: Ambulatory Visit | Attending: Cardiovascular Disease | Admitting: Cardiovascular Disease

## 2016-02-03 ENCOUNTER — Encounter (HOSPITAL_COMMUNITY)
Admission: RE | Admit: 2016-02-03 | Discharge: 2016-02-03 | Disposition: A | Discharge status: Home / Self Care | Payer: Self-pay | Source: Ambulatory Visit | Attending: Cardiovascular Disease | Admitting: Cardiovascular Disease

## 2016-02-05 ENCOUNTER — Encounter (HOSPITAL_COMMUNITY)
Admission: RE | Admit: 2016-02-05 | Discharge: 2016-02-05 | Disposition: A | Discharge status: Home / Self Care | Payer: Self-pay | Source: Ambulatory Visit | Attending: Cardiovascular Disease | Admitting: Cardiovascular Disease

## 2016-02-07 ENCOUNTER — Encounter (HOSPITAL_COMMUNITY)
Admission: RE | Admit: 2016-02-07 | Discharge: 2016-02-07 | Disposition: A | Discharge status: Home / Self Care | Payer: Self-pay | Source: Ambulatory Visit | Attending: Cardiovascular Disease | Admitting: Cardiovascular Disease

## 2016-02-10 ENCOUNTER — Telehealth (HOSPITAL_COMMUNITY): Payer: Self-pay | Admitting: *Deleted

## 2016-02-10 ENCOUNTER — Encounter (HOSPITAL_COMMUNITY): Admission: RE | Admit: 2016-02-10 | Payer: Self-pay | Source: Ambulatory Visit

## 2016-02-12 ENCOUNTER — Ambulatory Visit
Admission: RE | Admit: 2016-02-12 | Discharge: 2016-02-12 | Disposition: A | Discharge status: Home / Self Care | Payer: Medicare Other | Source: Ambulatory Visit | Attending: Urology | Admitting: Urology

## 2016-02-12 ENCOUNTER — Encounter (HOSPITAL_COMMUNITY): Payer: Self-pay

## 2016-02-12 DIAGNOSIS — N281 Cyst of kidney, acquired: Secondary | ICD-10-CM | POA: Diagnosis not present

## 2016-02-12 MED ORDER — GADOBENATE DIMEGLUMINE 529 MG/ML IV SOLN
15.0000 mL | Freq: Once | INTRAVENOUS | Status: AC | PRN
  Administered 2016-02-12: 15 mL via INTRAVENOUS

## 2016-02-14 ENCOUNTER — Encounter (HOSPITAL_COMMUNITY)
Admission: RE | Admit: 2016-02-14 | Discharge: 2016-02-14 | Disposition: A | Discharge status: Home / Self Care | Payer: Self-pay | Source: Ambulatory Visit | Attending: Cardiovascular Disease | Admitting: Cardiovascular Disease

## 2016-02-17 ENCOUNTER — Encounter (HOSPITAL_COMMUNITY)
Admission: RE | Admit: 2016-02-17 | Discharge: 2016-02-17 | Disposition: A | Discharge status: Home / Self Care | Payer: Self-pay | Source: Ambulatory Visit | Attending: Cardiovascular Disease | Admitting: Cardiovascular Disease

## 2016-02-19 ENCOUNTER — Encounter (HOSPITAL_COMMUNITY): Payer: Self-pay

## 2016-02-19 DIAGNOSIS — I214 Non-ST elevation (NSTEMI) myocardial infarction: Secondary | ICD-10-CM | POA: Insufficient documentation

## 2016-02-19 DIAGNOSIS — Z955 Presence of coronary angioplasty implant and graft: Secondary | ICD-10-CM | POA: Insufficient documentation

## 2016-02-21 ENCOUNTER — Encounter (HOSPITAL_COMMUNITY)
Admission: RE | Admit: 2016-02-21 | Discharge: 2016-02-21 | Disposition: A | Discharge status: Home / Self Care | Payer: Self-pay | Source: Ambulatory Visit | Attending: Cardiovascular Disease | Admitting: Cardiovascular Disease

## 2016-02-21 ENCOUNTER — Other Ambulatory Visit: Payer: Self-pay

## 2016-02-21 DIAGNOSIS — Z0000 Encounter for general adult medical examination without abnormal findings: Secondary | ICD-10-CM | POA: Diagnosis not present

## 2016-02-21 DIAGNOSIS — N281 Cyst of kidney, acquired: Secondary | ICD-10-CM | POA: Diagnosis not present

## 2016-02-21 MED ORDER — ALPRAZOLAM 1 MG PO TABS: 1.0000 mg | ORAL_TABLET | Freq: Every evening | ORAL | Status: DC | PRN

## 2016-02-21 NOTE — Telephone Encounter (Signed)
Refill x 3 months 

## 2016-02-21 NOTE — Telephone Encounter (Signed)
Last fill was 11/2015

## 2016-02-24 ENCOUNTER — Encounter (HOSPITAL_COMMUNITY)
Admission: RE | Admit: 2016-02-24 | Discharge: 2016-02-24 | Disposition: A | Discharge status: Home / Self Care | Payer: Self-pay | Source: Ambulatory Visit | Attending: Cardiovascular Disease | Admitting: Cardiovascular Disease

## 2016-02-26 ENCOUNTER — Encounter (HOSPITAL_COMMUNITY): Payer: Self-pay

## 2016-02-28 ENCOUNTER — Encounter (HOSPITAL_COMMUNITY)
Admission: RE | Admit: 2016-02-28 | Discharge: 2016-02-28 | Disposition: A | Discharge status: Home / Self Care | Payer: Self-pay | Source: Ambulatory Visit | Attending: Cardiovascular Disease | Admitting: Cardiovascular Disease

## 2016-03-02 ENCOUNTER — Encounter (HOSPITAL_COMMUNITY): Payer: Self-pay

## 2016-03-04 ENCOUNTER — Encounter (HOSPITAL_COMMUNITY): Payer: Self-pay

## 2016-03-06 ENCOUNTER — Encounter (HOSPITAL_COMMUNITY)
Admission: RE | Admit: 2016-03-06 | Discharge: 2016-03-06 | Disposition: A | Discharge status: Home / Self Care | Payer: Self-pay | Source: Ambulatory Visit | Attending: Cardiovascular Disease | Admitting: Cardiovascular Disease

## 2016-03-09 ENCOUNTER — Encounter (HOSPITAL_COMMUNITY): Payer: Self-pay

## 2016-03-11 ENCOUNTER — Encounter (HOSPITAL_COMMUNITY): Payer: Self-pay

## 2016-03-13 ENCOUNTER — Other Ambulatory Visit: Payer: Self-pay | Admitting: Cardiovascular Disease

## 2016-03-13 ENCOUNTER — Encounter (HOSPITAL_COMMUNITY): Payer: Self-pay

## 2016-03-16 ENCOUNTER — Encounter (HOSPITAL_COMMUNITY)
Admission: RE | Admit: 2016-03-16 | Discharge: 2016-03-16 | Disposition: A | Discharge status: Home / Self Care | Payer: Self-pay | Source: Ambulatory Visit | Attending: Cardiovascular Disease | Admitting: Cardiovascular Disease

## 2016-03-18 ENCOUNTER — Encounter (HOSPITAL_COMMUNITY)
Admission: RE | Admit: 2016-03-18 | Discharge: 2016-03-18 | Disposition: A | Discharge status: Home / Self Care | Payer: Self-pay | Source: Ambulatory Visit | Attending: Cardiovascular Disease | Admitting: Cardiovascular Disease

## 2016-03-20 ENCOUNTER — Encounter (HOSPITAL_COMMUNITY)
Admission: RE | Admit: 2016-03-20 | Discharge: 2016-03-20 | Disposition: A | Discharge status: Home / Self Care | Payer: Self-pay | Source: Ambulatory Visit | Attending: Cardiovascular Disease | Admitting: Cardiovascular Disease

## 2016-03-23 ENCOUNTER — Encounter (HOSPITAL_COMMUNITY)
Admission: RE | Admit: 2016-03-23 | Discharge: 2016-03-23 | Disposition: A | Discharge status: Home / Self Care | Payer: Self-pay | Source: Ambulatory Visit | Attending: Cardiovascular Disease | Admitting: Cardiovascular Disease

## 2016-03-23 DIAGNOSIS — Z955 Presence of coronary angioplasty implant and graft: Secondary | ICD-10-CM | POA: Insufficient documentation

## 2016-03-23 DIAGNOSIS — I214 Non-ST elevation (NSTEMI) myocardial infarction: Secondary | ICD-10-CM | POA: Insufficient documentation

## 2016-03-25 ENCOUNTER — Encounter (HOSPITAL_COMMUNITY): Payer: Self-pay

## 2016-03-27 ENCOUNTER — Telehealth (HOSPITAL_COMMUNITY): Payer: Self-pay | Admitting: *Deleted

## 2016-03-27 ENCOUNTER — Encounter (HOSPITAL_COMMUNITY): Payer: Self-pay

## 2016-03-30 ENCOUNTER — Encounter (HOSPITAL_COMMUNITY)
Admission: RE | Admit: 2016-03-30 | Discharge: 2016-03-30 | Disposition: A | Discharge status: Home / Self Care | Payer: Self-pay | Source: Ambulatory Visit | Attending: Cardiovascular Disease | Admitting: Cardiovascular Disease

## 2016-04-01 ENCOUNTER — Encounter (HOSPITAL_COMMUNITY): Payer: Self-pay

## 2016-04-03 ENCOUNTER — Encounter (HOSPITAL_COMMUNITY)
Admission: RE | Admit: 2016-04-03 | Discharge: 2016-04-03 | Disposition: A | Discharge status: Home / Self Care | Payer: Self-pay | Source: Ambulatory Visit | Attending: Cardiovascular Disease | Admitting: Cardiovascular Disease

## 2016-04-06 ENCOUNTER — Other Ambulatory Visit: Payer: Self-pay | Admitting: Cardiovascular Disease

## 2016-04-06 ENCOUNTER — Encounter (HOSPITAL_COMMUNITY)
Admission: RE | Admit: 2016-04-06 | Discharge: 2016-04-06 | Disposition: A | Discharge status: Home / Self Care | Payer: Self-pay | Source: Ambulatory Visit | Attending: Cardiovascular Disease | Admitting: Cardiovascular Disease

## 2016-04-07 ENCOUNTER — Other Ambulatory Visit: Payer: Self-pay

## 2016-04-07 DIAGNOSIS — I2510 Atherosclerotic heart disease of native coronary artery without angina pectoris: Secondary | ICD-10-CM

## 2016-04-08 ENCOUNTER — Encounter (HOSPITAL_COMMUNITY)
Admission: RE | Admit: 2016-04-08 | Discharge: 2016-04-08 | Disposition: A | Discharge status: Home / Self Care | Payer: Self-pay | Source: Ambulatory Visit | Attending: Cardiovascular Disease | Admitting: Cardiovascular Disease

## 2016-04-09 ENCOUNTER — Other Ambulatory Visit: Payer: Self-pay | Admitting: Internal Medicine

## 2016-04-10 ENCOUNTER — Encounter (HOSPITAL_COMMUNITY): Payer: Self-pay

## 2016-04-13 ENCOUNTER — Encounter (HOSPITAL_COMMUNITY): Payer: Self-pay

## 2016-04-14 ENCOUNTER — Encounter: Payer: Self-pay | Admitting: Pulmonary Disease

## 2016-04-14 ENCOUNTER — Ambulatory Visit (INDEPENDENT_AMBULATORY_CARE_PROVIDER_SITE_OTHER): Payer: Medicare Other | Admitting: Pulmonary Disease

## 2016-04-14 VITALS — BP 136/88 | HR 67 | Ht 64.0 in | Wt 171.6 lb

## 2016-04-14 DIAGNOSIS — R0902 Hypoxemia: Secondary | ICD-10-CM

## 2016-04-14 DIAGNOSIS — I2510 Atherosclerotic heart disease of native coronary artery without angina pectoris: Secondary | ICD-10-CM | POA: Diagnosis not present

## 2016-04-14 DIAGNOSIS — J9611 Chronic respiratory failure with hypoxia: Secondary | ICD-10-CM | POA: Diagnosis not present

## 2016-04-14 NOTE — Progress Notes (Signed)
   Subjective:    Patient ID: Carrie Chung, female    DOB: 01-02-1941, 75 y.o.   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 She is in maintenance cardiac rehabilitation. She has noted herself to desaturate after exertion as low as 91%. PSG 04/2015 (dohmeier)- this does not show significant OSA -AHI 4.5, RDI 7.6/hwhich showed desaturation for 106 minutes. She has restless leg syndrome which is improved off Wellbutrin  04/14/2016  Chief Complaint  Patient presents with  . Follow-up    breathless going up stairs, gained weight recently +25lbs.  Patient states that she has more trouble with DOE.  Heart rate is going up/ O2 is dropping, lowest is 88%, she recovers quickly.   1m FU Lost 6 lbs since last visit Compliant with nocturnal oxygen- she cannot tell much of a difference when she stopped using for 5 days as an experiment-perhaps had increased fatigue in the daytime She continues with maintenance cardiac rehabilitation-desaturates to low 90s  Does feel dyspneic on occasion, continues to have a dry cough No pedal edema or orthopnea She is planning a trip to [place] in May  Significant tests/ events  03/2012 echo normal EF 04/2014 exercise stress test normal 04/2014, 05/2015  CXR normal ONO on RA 09/2015 -  Continues to desaturate x 400 minutes PFT that showed nml lung function. No airflow obstruction/restriction. DLCO nml .  CT angio chest neg for PE, mild emphysema .  Bubble echo with gg 1 DD , EF ok , no shunt.   Review of Systems Patient denies significant dyspnea,cough, hemoptysis,  chest  pain, palpitations, pedal edema, orthopnea, paroxysmal nocturnal dyspnea, lightheadedness, nausea, vomiting, abdominal or  leg pains      Objective:   Physical Exam  Gen. Pleasant, well-nourished, in no distress ENT - no lesions, no post nasal drip Neck: No JVD, no thyromegaly, no carotid bruits Lungs: no use of accessory muscles, no dullness to percussion, clear without rales or rhonchi  Cardiovascular: Rhythm regular, heart sounds  normal, no murmurs or gallops, no peripheral edema Musculoskeletal: No deformities, no cyanosis or clubbing         Assessment & Plan:

## 2016-04-14 NOTE — Addendum Note (Signed)
Addended by: Mathis Dad on: 04/14/2016 05:12 PM   Modules accepted: Orders

## 2016-04-14 NOTE — Assessment & Plan Note (Signed)
Repeat: ONO testing in 6 months

## 2016-04-14 NOTE — Patient Instructions (Signed)
Check Oxygen levels during sleep prior to next visit

## 2016-04-14 NOTE — Assessment & Plan Note (Signed)
She will continue nocturnal oxygen It is okay not to take oxygen with her on her trip to [place]

## 2016-04-15 ENCOUNTER — Encounter (HOSPITAL_COMMUNITY)
Admission: RE | Admit: 2016-04-15 | Discharge: 2016-04-15 | Disposition: A | Discharge status: Home / Self Care | Payer: Self-pay | Source: Ambulatory Visit | Attending: Cardiovascular Disease | Admitting: Cardiovascular Disease

## 2016-04-17 ENCOUNTER — Encounter (HOSPITAL_COMMUNITY): Payer: Self-pay

## 2016-04-20 ENCOUNTER — Encounter (HOSPITAL_COMMUNITY)
Admission: RE | Admit: 2016-04-20 | Discharge: 2016-04-20 | Disposition: A | Discharge status: Home / Self Care | Payer: Self-pay | Source: Ambulatory Visit | Attending: Cardiovascular Disease | Admitting: Cardiovascular Disease

## 2016-04-20 DIAGNOSIS — Z955 Presence of coronary angioplasty implant and graft: Secondary | ICD-10-CM | POA: Insufficient documentation

## 2016-04-20 DIAGNOSIS — I252 Old myocardial infarction: Secondary | ICD-10-CM | POA: Insufficient documentation

## 2016-04-22 ENCOUNTER — Encounter (HOSPITAL_COMMUNITY)
Admission: RE | Admit: 2016-04-22 | Discharge: 2016-04-22 | Disposition: A | Discharge status: Home / Self Care | Payer: Self-pay | Source: Ambulatory Visit | Attending: Cardiovascular Disease | Admitting: Cardiovascular Disease

## 2016-04-24 ENCOUNTER — Encounter (HOSPITAL_COMMUNITY): Payer: Self-pay

## 2016-04-27 ENCOUNTER — Encounter (HOSPITAL_COMMUNITY)
Admission: RE | Admit: 2016-04-27 | Discharge: 2016-04-27 | Disposition: A | Discharge status: Home / Self Care | Payer: Self-pay | Source: Ambulatory Visit | Attending: Cardiovascular Disease | Admitting: Cardiovascular Disease

## 2016-04-29 ENCOUNTER — Encounter (HOSPITAL_COMMUNITY)
Admission: RE | Admit: 2016-04-29 | Discharge: 2016-04-29 | Disposition: A | Discharge status: Home / Self Care | Payer: Self-pay | Source: Ambulatory Visit | Attending: Cardiovascular Disease | Admitting: Cardiovascular Disease

## 2016-05-01 ENCOUNTER — Encounter (HOSPITAL_COMMUNITY)
Admission: RE | Admit: 2016-05-01 | Discharge: 2016-05-01 | Disposition: A | Discharge status: Home / Self Care | Payer: Self-pay | Source: Ambulatory Visit | Attending: Cardiovascular Disease | Admitting: Cardiovascular Disease

## 2016-05-04 ENCOUNTER — Encounter (HOSPITAL_COMMUNITY)
Admission: RE | Admit: 2016-05-04 | Discharge: 2016-05-04 | Disposition: A | Discharge status: Home / Self Care | Payer: Self-pay | Source: Ambulatory Visit | Attending: Cardiovascular Disease | Admitting: Cardiovascular Disease

## 2016-05-06 ENCOUNTER — Encounter (HOSPITAL_COMMUNITY)
Admission: RE | Admit: 2016-05-06 | Discharge: 2016-05-06 | Disposition: A | Discharge status: Home / Self Care | Payer: Self-pay | Source: Ambulatory Visit | Attending: Cardiovascular Disease | Admitting: Cardiovascular Disease

## 2016-05-08 ENCOUNTER — Encounter (HOSPITAL_COMMUNITY): Payer: Self-pay

## 2016-05-11 ENCOUNTER — Encounter (HOSPITAL_COMMUNITY)
Admission: RE | Admit: 2016-05-11 | Discharge: 2016-05-11 | Disposition: A | Discharge status: Home / Self Care | Payer: Self-pay | Source: Ambulatory Visit | Attending: Cardiovascular Disease | Admitting: Cardiovascular Disease

## 2016-05-13 ENCOUNTER — Encounter (HOSPITAL_COMMUNITY): Payer: Self-pay

## 2016-05-15 ENCOUNTER — Encounter (HOSPITAL_COMMUNITY): Payer: Self-pay

## 2016-05-20 ENCOUNTER — Encounter (HOSPITAL_COMMUNITY): Payer: Self-pay

## 2016-05-22 ENCOUNTER — Encounter (HOSPITAL_COMMUNITY): Payer: Self-pay

## 2016-05-22 DIAGNOSIS — Z955 Presence of coronary angioplasty implant and graft: Secondary | ICD-10-CM | POA: Insufficient documentation

## 2016-05-22 DIAGNOSIS — I252 Old myocardial infarction: Secondary | ICD-10-CM | POA: Insufficient documentation

## 2016-05-25 ENCOUNTER — Encounter (HOSPITAL_COMMUNITY): Payer: Self-pay

## 2016-05-27 ENCOUNTER — Encounter (HOSPITAL_COMMUNITY): Payer: Self-pay

## 2016-05-29 ENCOUNTER — Encounter (HOSPITAL_COMMUNITY): Payer: Self-pay

## 2016-06-01 ENCOUNTER — Encounter (HOSPITAL_COMMUNITY): Payer: Self-pay

## 2016-06-01 ENCOUNTER — Other Ambulatory Visit: Payer: Self-pay

## 2016-06-01 MED ORDER — AMLODIPINE BESYLATE 5 MG PO TABS: ORAL_TABLET | ORAL | Status: DC

## 2016-06-01 MED ORDER — DESVENLAFAXINE SUCCINATE ER 50 MG PO TB24: 50.0000 mg | ORAL_TABLET | Freq: Every day | ORAL | Status: DC

## 2016-06-02 ENCOUNTER — Encounter: Payer: Self-pay | Admitting: Neurology

## 2016-06-02 ENCOUNTER — Ambulatory Visit (INDEPENDENT_AMBULATORY_CARE_PROVIDER_SITE_OTHER): Payer: Medicare Other | Admitting: Neurology

## 2016-06-02 VITALS — BP 140/82 | HR 86 | Resp 20 | Ht 61.0 in | Wt 167.0 lb

## 2016-06-02 DIAGNOSIS — R419 Unspecified symptoms and signs involving cognitive functions and awareness: Secondary | ICD-10-CM

## 2016-06-02 DIAGNOSIS — Z1389 Encounter for screening for other disorder: Secondary | ICD-10-CM

## 2016-06-02 DIAGNOSIS — I2510 Atherosclerotic heart disease of native coronary artery without angina pectoris: Secondary | ICD-10-CM | POA: Diagnosis not present

## 2016-06-02 DIAGNOSIS — Z1331 Encounter for screening for depression: Secondary | ICD-10-CM

## 2016-06-02 NOTE — Patient Instructions (Signed)
Seasonal Affective Disorder Seasonal affective disorder (SAD) is a form of depression. It is when you feel sad, down, or blue at specific times of the year. The most common time of year for this is late fall and winter, when the days are shorter and most people spend less time outdoors. This is why SAD is also known as the "winter blues." SAD occurs less commonly in the spring or summer.  SAD can vary in severity and can interfere with work, school, relationships, and normal daily activities. RISK FACTORS This condition is more common in:  [place] women.  People who have a history of clinical depression or bipolar disorder.  People who live far [place] or far [place] of the equator. SYMPTOMS Symptoms of this condition include:  Depressed mood, such as:  Feeling sad, down, blue, or teary.  Having crying spells.  Irritability.  Trouble sleeping or sleeping more than usual.  Loss of interest in activities that you usually enjoy.  Feelings of guilt or worthlessness.  Restlessness or loss of energy.  Difficulty concentrating, remembering, or making decisions.  Significant change in appetite or weight.  Recurrent wishes for death, recurrent thoughts of self-harm, or an attempt at suicide. DIAGNOSIS Diagnosis of this condition is usually made through an assessment by your health care provider. You will be asked about your moods, thoughts, and behaviors. You will also be asked about your medical history, any major life changes, medicines, and substance use. A physical exam and lab work may be done to make sure there is no other cause for your depression. You may be referred to a mental health specialist for further evaluation.  TREATMENT Treatment for this condition may include:  Light therapy. Light therapy involves sitting in front of a light source for 15-30 minutes every day. It is thought to work by increasing the duration of daylight that is sensed by the brain.  Antidepressant  medicine.  Cognitive behavioral therapy (CBT).CBT is a form of talk therapy that helps to identify and change negative thoughts that are associated with SAD. HOME CARE INSTRUCTIONS  Take over-the-counter and prescription medicines only as told by your healthcare provider. This is important.  Check with your health care provider before you start taking any new prescriptions, over-the-counter medicines, herbs, or supplements.  Keep all follow-up visits as told by your health care provider.This is important.  Maintain a healthy lifestyle.  Eat healthy foods.  Get plenty of sleep.  Exercise regularly.  Do not drink alcohol.  Do not use recreational drugs.  Make your home and work environment as sunny or bright as possible. Open blinds and spend as much time as possible outside. SEEK MEDICAL CARE IF:  Your medicines do not seem to be helping.  Your symptoms do not improve or they get worse.  You have trouble taking care of yourself.  You experience side effects of medicines, such as changes in sleep patterns, dizziness, drowsiness, weight gain, restlessness, movement changes, or tremors. SEEK IMMEDIATE MEDICAL CARE IF:  You have serious thoughts about hurting yourself or others.  You experience serious side effects of medicine, such as:  Swelling of your face, lips, tongue, or throat.  Fever, confusion, muscle spasms, or seizures.   This information is not intended to replace advice given to you by your health care provider. Make sure you discuss any questions you have with your health care provider.   Document Released: 09/01/2001 Document Revised: 04/23/2015 Document Reviewed: 12/11/2014 Elsevier Interactive Patient Education [employer].

## 2016-06-02 NOTE — Progress Notes (Addendum)
Sleep medicine clinic   Provider:  Larey Seat, M D  Referring Provider: [name], MD Primary Care Physician:  [name], MD  Chief Complaint  Patient presents with  . Follow-up    memory, has chest congestion, rm 10, alone    HPI:  Carrie Chung is a 75 y.o. brit[place] born female, seen here as a referral from Dr. Tommie Ard Baxley for evaluation of her cognitive abilities, cognitive slowing.   Carrie Chung reports today that she has been feeling a little slower in her cognitive abilities , but that she is not nearly as concerned as her family doctor and her family. The patient states that she was always proud of her abilities to visually recall what her auditory processing memory was not as strong. She always kept nodes and exacting notes while in college and [employer]. Her visual spatial abilities were excellent. She feels that she has somewhat slowed she's not quite as good in these tasks she has always wondered if she is perhaps also less attentive. She has  not misplaced items, but her husband does. He has not gotten lost when driving, she still interested in reading books and different subject matter and she usually finds the page she ended up reading. She has never balance the checkbook. 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 Her mathematical skills have not suffered in any way. She is a poor sleeper she reports with been. She rarely gets 6 hours of nocturnal sleep. At the sleep was very fragmented this has always been the case. She's not concerned about that also she has been reporting snoring, wheezing and coughing. Some swelling of the legs some chest pain muscle cramps death interest in activities, decreased memory decreased energy, anxiety, depression, restless legs. She has no history of shift work. We concentrate today on medication and memory effects. Depression as well. 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 Since then she is followed by Dr. Burt Knack in regular intervals.  06-04-15; Carrie Chung is here to follow-up on her recent sleep study performed on 04-24-15. The patient chief complaint was cognitive decline but she also presented for sleep complaints of snoring, wheezing coughing at night having restless legs and feeling constantly fatigued. She was not excessively daytime sleepy as much as fatigue. The sleep study revealed an AHI of 4.5 and a respiratory disturbance index of 7.6 this is not considered significant apnea. Only during REM sleep did she have an AHI of 11.5 , which would still be considered mild supine sleep also exenterated her apnea index. She did have desaturations and a total time of 106 minutes of oxygen levels at or below 90% was measured. There were no significant periodic limb movements or spontaneous arousal index was the highest at 9.4. hypoxemia seems to be the key -  elevating her nadir we may give her a lot more energy during daytime.  As she has associated coronary artery disease as a core morbidity and she is also using muscle relaxants and sometimes opiates she may have even less of a breathing drive.  Suspected asthma or COPD per PCP note. Oxygen will improve the situation, but  oxygen therapy usually starts at 2 L/m. She has morning headaches without CO2 retention. - hypoxemia? From the perspective of the sleep clinic here I cannot order oxygen therapy without associated CPAP use. However I don't think that  this patient needs  CPAP. I would like for her to speak to the pulmonologist to qualify for oxygen use at home. She will see pulmonology colleague Dr. Elsworth Soho were also read sleep studies tomorrow.  Interval history from 06/02/2016. As the pleasure of seeing Carrie Chung again today and the follow in regular intervals to test her cognitive abilities. She does suffer still from sleep hypoxemia of unknown etiology, and Dr.  Elsworth Soho supported 2 L of oxygen at night per nasal cannula. During a journey to her home country for 10 days she was not using oxygen and noted that there is a quality difference in her sleep. But I cannot offer her an explanation I do think that she needs to continue the treatment. Subsequent pulse oximetries have confirmed that the hypoxemia is still present. She has still wheezing. She underwent a CT chest with contrast, no PE and no beehive lung found, pulmonary function was norma!  Asthma only present with viral infection, bronchiospasm, usually resolving within weeks. Dr Burt Knack evaluated  Her cardiac stress response was attributing the Heart rate to compensation for low SP02.  .  Neurodegenerative origin ???-  not likely  Today she scored 28 out of 30 on a Montral cognitive assessment, which is a very high score. She managed the trail making test which is important for driving and she is also able to do visual spatial drawings. She only missed 1 out of 5 recall words.  She still has sleep disturbancies, sleeps better with oxygen. Takes xanax ( Dr. Renold Genta).   Montreal Cognitive Assessment  06/02/2016  Visuospatial/ Executive (0/5) 4  Naming (0/3) 3  Attention: Read list of digits (0/2) 2  Attention: Read list of letters (0/1) 1  Attention: Serial 7 subtraction starting at 100 (0/3) 3  Language: Repeat phrase (0/2) 2  Language : Fluency (0/1) 1  Abstraction (0/2) 2  Delayed Recall (0/5) 4  Orientation (0/6) 6  Total 28  Adjusted Score (based on education) 28    Review of Systems: Out of a complete 14 system review, the patient complains of only the following symptoms, and all other reviewed systems are negative. 06-04-15  PHQ9 was endorsed at 18 points, her Epworth sleepiness score was endorsed at 8 points and her fatigue severity score was endorsed at 46.  MOCA at 28-30,  6-13- 2017 . geriatric depression score 5/15 - improved with pristiq and therapy, monopolar depression. She is not able  to cry.  Her mother died 88 month ago at 28.  The patient got leg cramps with a previous cholesterol-lowering medication she switched to Zocor for and is now doing better,   Social History   Social History  . Marital Status: Married    Spouse Name: N/A  . Number of Children: 1  . Years of Education: Coll +   Occupational History  . Retired Marine scientist    Social History Main Topics  . Smoking status: Former Smoker    Types: Cigarettes    Quit date: 04/19/1980  . Smokeless tobacco: Never Used  . Alcohol Use: 12.0 oz/week    20 Glasses of wine per week     Comment: socially  . Drug Use: No  . Sexual Activity: No   Other Topics Concern  . Not on file   Social History Narrative   Caffeine 2 cups tea.    Family History  Problem Relation Age of Onset  . Coronary artery disease Mother     CABG at age 18  .  Cancer Father     larynx    Past Medical History  Diagnosis Date  . Hypokalemia   . HTN (hypertension)   . Sleep apnea   . Endometrial polyp   . CAD (coronary artery disease)     a. NSTEMI 03/2012 ([place] 03/27/12: pLAD 30%, mLAD 99%, mCFX 95%, mRCA 99% with thrombus, EF 65%);  s/p PTCA/DESx1 to prox-mid RCA 03/27/12 urgently in setting of hypotension/bradycardia, with staged PTCA/Evolve study stent to mid LAD & PTCA/Evolve study stent to prox LCx 03/29/12 ;   echo 03/28/12:EF 60%, Aortic sclerosis without AS, mild RVE, mild reduced RVSF    . Hyperglycemia   . Hyperlipidemia   . Atrial tachycardia ([place])     a. noted at cardiac rehab 5/13; event monitor ordered to assess for AFib  . MI, old     Past Surgical History  Procedure Laterality Date  . Cholecystectomy  1978  . Appendectomy  75 years old  . Tonsillectomy  75 years old  . Cesarean section  1984  . Carotid stent  04/2012    x3  . Left heart catheterization with coronary angiogram N/A 03/27/2012    Procedure: LEFT HEART CATHETERIZATION WITH CORONARY ANGIOGRAM;  Surgeon: Burnell Blanks, MD;  Location: [facility] C[facility] LAB;   Service: Cardiovascular;  Laterality: N/A;  . Percutaneous coronary stent intervention (pci-s) N/A 03/27/2012    Procedure: PERCUTANEOUS CORONARY STENT INTERVENTION (PCI-S);  Surgeon: Burnell Blanks, MD;  Location: [facility] CATH LAB;  Service: Cardiovascular;  Laterality: N/A;  . Percutaneous coronary stent intervention (pci-s) N/A 03/29/2012    Procedure: PERCUTANEOUS CORONARY STENT INTERVENTION (PCI-S);  Surgeon: [name], MD;  Location: [facility] CATH LAB;  Service: Cardiovascular;  Laterality: N/A;    Current Outpatient Prescriptions  Medication Sig Dispense Refill  . albuterol (PROVENTIL HFA;VENTOLIN HFA) 108 (90 BASE) MCG/ACT inhaler Inhale 2 puffs into the lungs every 6 (six) hours as needed for wheezing or shortness of breath. 1 Inhaler 2  . ALPRAZolam (XANAX) 1 MG tablet Take 1 tablet (1 mg total) by mouth at bedtime as needed. 30 tablet 2  . amLODipine (NORVASC) 5 MG tablet Take 1 tab daily 90 tablet 1  . aspirin 81 MG tablet Take 1 tablet (81 mg total) by mouth daily.    . Calcium Carbonate-Vitamin D (CALTRATE 600+D) 600-400 MG-UNIT per tablet Take 1 tablet by mouth 2 (two) times daily.     . clopidogrel (PLAVIX) 75 MG tablet TAKE 1 TABLET (75 MG TOTAL) BY MOUTH DAILY. 90 tablet 2  . CRESTOR 10 MG tablet TAKE 1 TABLET (10 MG TOTAL) BY MOUTH DAILY. 90 tablet 3  . desvenlafaxine (PRISTIQ) 50 MG 24 hr tablet Take 1 tablet (50 mg total) by mouth daily. 90 tablet 1  . diclofenac sodium (VOLTAREN) 1 % GEL Apply 2 g topically 2 (two) times daily. 100 g 1  . esomeprazole (NEXIUM) 40 MG capsule Take 40 mg by mouth daily at 12 noon.    . folic acid-pyridoxine-cyancobalamin (FOLBIC) 2.5-25-2 MG TABS tablet Take 1 tablet by mouth daily. 90 tablet 3  . hydrochlorothiazide (HYDRODIURIL) 25 MG tablet Take 1 tablet (25 mg total) by mouth daily. 90 tablet 3  . HYDROcodone-acetaminophen (NORCO) 10-325 MG per tablet Take 1 tablet by mouth every 8 (eight) hours as needed for severe pain. 90 tablet 0  .  metoprolol (LOPRESSOR) 50 MG tablet Take 50 mg by mouth 2 (two) times daily.    Marland Kitchen NITROSTAT 0.4 MG SL tablet PLACE 1 TAB UNDER  TONGUE EVERY 5 MINUTES FOR UP TO 3 DOSES AS NEEDED FOR CHEST PAIN 25 tablet 1  . NON FORMULARY Oxygen 2L at night     No current facility-administered medications for this visit.    Allergies as of 06/02/2016 - Review Complete 06/02/2016  Allergen Reaction Noted  . Penicillins Hives and Swelling   . Sulfonamide derivatives Hives and Swelling   . Zinc oxide Rash     Vitals: BP 140/82 mmHg  Pulse 86  Resp 20  Ht 5\' 1"  (1.549 m)  Wt 167 lb (75.751 kg)  BMI 31.57 kg/m2 Last Weight:  Wt Readings from Last 1 Encounters:  06/02/16 167 lb (75.751 kg)   Last Height:   Ht Readings from Last 1 Encounters:  06/02/16 5\' 1"  (1.549 m)    Physical exam:  General: The patient is awake, alert and appears not in acute distress. The patient is well groomed. Head: Normocephalic, atraumatic. Neck is supple. Mallampati 4, neck circumference:15 Cardiovascular:  Regular rate and rhythm without  murmurs or carotid bruit, and without distended neck veins. Respiratory: Lungs are clear to auscultation. Skin:  Without evidence of edema, or rash Trunk: BMI is  elevated and patient  has normal posture.  Neurologic exam : The patient is awake and alert, oriented to place and time.  Memory subjective  described as unimpaired . MOCA 28-30, the only 2 points Carrie Chung missed on her Montral cognitive assessment test were to of the 5 recall words. A score of 28 out of 30 is considered normal. The performed the MMSE prior this 29 out of 30 points and here she missed 1 of the serial 7.  There is a normal attention span & concentration ability.  Speech is fluent without dysarthria, dysphonia or aphasia. Mood and affect are appropriate.  Cranial nerves: Pupils are equal and briskly reactive to light. Funduscopic exam without  evidence of pallor or edema. Extraocular movements  in  vertical and horizontal planes intact and without nystagmus.  Visual fields by finger perimetry are intact. Hearing to finger rub intact.  Facial sensation intact to fine touch. Facial motor strength is symmetric and tongue and uvula move midline. Tongue protrusion into either cheek is normal. Shoulder shrug is normal.    Assessment:  After physical and neurologic examination, review of laboratory studies, imaging, neurophysiology testing and pre-existing records, 40 minute  assessment is that of :  Plan:  Treatment plan and additional workup :  1) continue Pristiq, keep away form wellbutrin for RLS and anxiety side effect.  2) OSA nor found, but prolonged period of hypoxemia. She has not enough apnea to justify CPAP, but enough hypoxemia to be on oxygen at night. Sees Dr.Alava tomorrow.  3) RLS intermittent , better since off Wellbutrin.   50% of our 25 minute cognitive evaluation today, I spent face-to-face,  discussing the patient's symptoms part of which have already improved.     Asencion Partridge Rhea Thrun MD     06/02/2016    Cc Dr Emeline General

## 2016-06-03 ENCOUNTER — Encounter (HOSPITAL_COMMUNITY): Payer: Self-pay

## 2016-06-05 ENCOUNTER — Encounter (HOSPITAL_COMMUNITY)
Admission: RE | Admit: 2016-06-05 | Discharge: 2016-06-05 | Disposition: A | Discharge status: Home / Self Care | Payer: Self-pay | Source: Ambulatory Visit | Attending: Cardiovascular Disease | Admitting: Cardiovascular Disease

## 2016-06-08 ENCOUNTER — Encounter (HOSPITAL_COMMUNITY)
Admission: RE | Admit: 2016-06-08 | Discharge: 2016-06-08 | Disposition: A | Discharge status: Home / Self Care | Payer: Self-pay | Source: Ambulatory Visit | Attending: Cardiovascular Disease | Admitting: Cardiovascular Disease

## 2016-06-10 ENCOUNTER — Encounter (HOSPITAL_COMMUNITY)
Admission: RE | Admit: 2016-06-10 | Discharge: 2016-06-10 | Disposition: A | Discharge status: Home / Self Care | Payer: Self-pay | Source: Ambulatory Visit | Attending: Cardiovascular Disease | Admitting: Cardiovascular Disease

## 2016-06-12 ENCOUNTER — Encounter (HOSPITAL_COMMUNITY): Payer: Self-pay

## 2016-06-15 ENCOUNTER — Encounter (HOSPITAL_COMMUNITY): Payer: Self-pay

## 2016-06-17 ENCOUNTER — Encounter (HOSPITAL_COMMUNITY)
Admission: RE | Admit: 2016-06-17 | Discharge: 2016-06-17 | Disposition: A | Discharge status: Home / Self Care | Payer: Self-pay | Source: Ambulatory Visit | Attending: Cardiovascular Disease | Admitting: Cardiovascular Disease

## 2016-06-19 ENCOUNTER — Other Ambulatory Visit: Payer: Self-pay

## 2016-06-19 ENCOUNTER — Encounter (HOSPITAL_COMMUNITY)
Admission: RE | Admit: 2016-06-19 | Discharge: 2016-06-19 | Disposition: A | Discharge status: Home / Self Care | Payer: Self-pay | Source: Ambulatory Visit | Attending: Cardiovascular Disease | Admitting: Cardiovascular Disease

## 2016-06-19 MED ORDER — ALPRAZOLAM 1 MG PO TABS: 1.0000 mg | ORAL_TABLET | Freq: Every evening | ORAL | Status: DC | PRN

## 2016-06-22 ENCOUNTER — Encounter (HOSPITAL_COMMUNITY): Payer: Medicare Other

## 2016-06-22 DIAGNOSIS — Z955 Presence of coronary angioplasty implant and graft: Secondary | ICD-10-CM | POA: Insufficient documentation

## 2016-06-22 DIAGNOSIS — I252 Old myocardial infarction: Secondary | ICD-10-CM | POA: Insufficient documentation

## 2016-06-24 ENCOUNTER — Encounter (HOSPITAL_COMMUNITY)
Admission: RE | Admit: 2016-06-24 | Discharge: 2016-06-24 | Disposition: A | Discharge status: Home / Self Care | Payer: Self-pay | Source: Ambulatory Visit | Attending: Cardiovascular Disease | Admitting: Cardiovascular Disease

## 2016-06-26 ENCOUNTER — Encounter (HOSPITAL_COMMUNITY): Payer: Self-pay

## 2016-06-29 ENCOUNTER — Other Ambulatory Visit: Payer: Medicare Other | Admitting: Internal Medicine

## 2016-06-29 ENCOUNTER — Other Ambulatory Visit: Payer: Self-pay | Admitting: Internal Medicine

## 2016-06-29 ENCOUNTER — Encounter (HOSPITAL_COMMUNITY)
Admission: RE | Admit: 2016-06-29 | Discharge: 2016-06-29 | Disposition: A | Discharge status: Home / Self Care | Payer: Self-pay | Source: Ambulatory Visit | Attending: Cardiovascular Disease | Admitting: Cardiovascular Disease

## 2016-06-29 DIAGNOSIS — I10 Essential (primary) hypertension: Secondary | ICD-10-CM

## 2016-06-29 DIAGNOSIS — E785 Hyperlipidemia, unspecified: Secondary | ICD-10-CM | POA: Diagnosis not present

## 2016-06-29 DIAGNOSIS — Z130 Encounter for screening for diseases of the blood and blood-forming organs and certain disorders involving the immune mechanism: Secondary | ICD-10-CM | POA: Diagnosis not present

## 2016-06-29 DIAGNOSIS — Z1321 Encounter for screening for nutritional disorder: Secondary | ICD-10-CM | POA: Diagnosis not present

## 2016-06-29 DIAGNOSIS — Z1329 Encounter for screening for other suspected endocrine disorder: Secondary | ICD-10-CM | POA: Diagnosis not present

## 2016-06-29 DIAGNOSIS — R7301 Impaired fasting glucose: Secondary | ICD-10-CM | POA: Diagnosis not present

## 2016-06-29 DIAGNOSIS — E559 Vitamin D deficiency, unspecified: Secondary | ICD-10-CM | POA: Diagnosis not present

## 2016-06-29 LAB — CBC WITH DIFFERENTIAL/PLATELET
BASOS ABS: 0 {cells}/uL (ref 0–200)
Basophils Relative: 0 %
Eosinophils Absolute: 40 cells/uL (ref 15–500)
Eosinophils Relative: 1 %
HEMATOCRIT: 41.1 % (ref 35.0–45.0)
Hemoglobin: 13.3 g/dL (ref 11.7–15.5)
LYMPHS ABS: 1280 {cells}/uL (ref 850–3900)
LYMPHS PCT: 32 %
MCH: 28.6 pg (ref 27.0–33.0)
MCHC: 32.4 g/dL (ref 32.0–36.0)
MCV: 88.4 fL (ref 80.0–100.0)
MPV: 9.5 fL (ref 7.5–12.5)
Monocytes Absolute: 320 cells/uL (ref 200–950)
Monocytes Relative: 8 %
NEUTROS PCT: 59 %
Neutro Abs: 2360 cells/uL (ref 1500–7800)
PLATELETS: 149 10*3/uL (ref 140–400)
RBC: 4.65 MIL/uL (ref 3.80–5.10)
RDW: 14.8 % (ref 11.0–15.0)
WBC: 4 10*3/uL (ref 3.8–10.8)

## 2016-06-29 LAB — COMPLETE METABOLIC PANEL WITH GFR
ALK PHOS: 66 U/L (ref 33–130)
ALT: 12 U/L (ref 6–29)
AST: 20 U/L (ref 10–35)
Albumin: 4.1 g/dL (ref 3.6–5.1)
BILIRUBIN TOTAL: 0.4 mg/dL (ref 0.2–1.2)
BUN: 8 mg/dL (ref 7–25)
CALCIUM: 8.9 mg/dL (ref 8.6–10.4)
CO2: 31 mmol/L (ref 20–31)
CREATININE: 0.73 mg/dL (ref 0.60–0.93)
Chloride: 99 mmol/L (ref 98–110)
GFR, EST NON AFRICAN AMERICAN: 81 mL/min (ref >=60)
Glucose, Bld: 121 mg/dL — ABNORMAL HIGH (ref 65–99)
Potassium: 3.7 mmol/L (ref 3.5–5.3)
Sodium: 142 mmol/L (ref 135–146)
TOTAL PROTEIN: 6.5 g/dL (ref 6.1–8.1)

## 2016-06-29 LAB — LIPID PANEL
CHOL/HDL RATIO: 2.1 ratio (ref <=5.0)
Cholesterol: 159 mg/dL (ref 125–200)
HDL: 76 mg/dL (ref >=46)
LDL Cholesterol: 51 mg/dL (ref <130)
Triglycerides: 161 mg/dL — ABNORMAL HIGH (ref <150)
VLDL: 32 mg/dL — AB (ref <30)

## 2016-06-29 LAB — TSH: TSH: 1.59 m[IU]/L

## 2016-06-30 ENCOUNTER — Ambulatory Visit (INDEPENDENT_AMBULATORY_CARE_PROVIDER_SITE_OTHER): Payer: Medicare Other | Admitting: Internal Medicine

## 2016-06-30 ENCOUNTER — Encounter: Payer: Self-pay | Admitting: Internal Medicine

## 2016-06-30 VITALS — BP 118/84 | HR 84 | Temp 98.7°F | Resp 18 | Ht 61.0 in | Wt 171.5 lb

## 2016-06-30 DIAGNOSIS — Z0000 Encounter for general adult medical examination without abnormal findings: Secondary | ICD-10-CM

## 2016-06-30 DIAGNOSIS — J069 Acute upper respiratory infection, unspecified: Secondary | ICD-10-CM

## 2016-06-30 DIAGNOSIS — E785 Hyperlipidemia, unspecified: Secondary | ICD-10-CM | POA: Diagnosis not present

## 2016-06-30 DIAGNOSIS — I2510 Atherosclerotic heart disease of native coronary artery without angina pectoris: Secondary | ICD-10-CM

## 2016-06-30 DIAGNOSIS — Z8739 Personal history of other diseases of the musculoskeletal system and connective tissue: Secondary | ICD-10-CM | POA: Diagnosis not present

## 2016-06-30 DIAGNOSIS — F329 Major depressive disorder, single episode, unspecified: Secondary | ICD-10-CM | POA: Diagnosis not present

## 2016-06-30 DIAGNOSIS — I10 Essential (primary) hypertension: Secondary | ICD-10-CM

## 2016-06-30 DIAGNOSIS — G4700 Insomnia, unspecified: Secondary | ICD-10-CM

## 2016-06-30 DIAGNOSIS — G4733 Obstructive sleep apnea (adult) (pediatric): Secondary | ICD-10-CM | POA: Diagnosis not present

## 2016-06-30 DIAGNOSIS — Z8679 Personal history of other diseases of the circulatory system: Secondary | ICD-10-CM

## 2016-06-30 DIAGNOSIS — F32A Depression, unspecified: Secondary | ICD-10-CM

## 2016-06-30 DIAGNOSIS — Z955 Presence of coronary angioplasty implant and graft: Secondary | ICD-10-CM

## 2016-06-30 LAB — VITAMIN D 25 HYDROXY (VIT D DEFICIENCY, FRACTURES): VIT D 25 HYDROXY: 49 ng/mL (ref 30–100)

## 2016-06-30 MED ORDER — ALPRAZOLAM 1 MG PO TABS: 1.0000 mg | ORAL_TABLET | Freq: Every evening | ORAL | Status: DC | PRN

## 2016-06-30 MED ORDER — METHYLPREDNISOLONE ACETATE 80 MG/ML IJ SUSP
80.0000 mg | Freq: Once | INTRAMUSCULAR | Status: AC
  Administered 2016-06-30: 80 mg via INTRAMUSCULAR

## 2016-06-30 MED ORDER — AZITHROMYCIN 250 MG PO TABS: ORAL_TABLET | ORAL | Status: DC

## 2016-07-01 ENCOUNTER — Encounter (HOSPITAL_COMMUNITY)
Admission: RE | Admit: 2016-07-01 | Discharge: 2016-07-01 | Disposition: A | Discharge status: Home / Self Care | Payer: Self-pay | Source: Ambulatory Visit | Attending: Cardiovascular Disease | Admitting: Cardiovascular Disease

## 2016-07-01 NOTE — Progress Notes (Signed)
Subjective:    Patient ID: Carrie Chung, female    DOB: May 12, 1941, 75 y.o.   MRN: [id number]  HPI 75 year old White Female in today for health maintenance exam and evaluation of medical issues. In 2013 she weighed 152 pounds. Now weighs 171.5 pounds.  Since June, she's had a respiratory infection with laryngitis and respiratory congestion. Was treated today with Zithromax Z-PAK and given IM Depo-Medrol.  She had a non-ST elevation MI in April 2013. While being hospitalized, she had bradycardia and ST elevation was noted. She was taken to the Cath Lab on an urgent basis and was found to have a 99% blockage in the right coronary artery with thrombus. A drug-eluting stent was placed. 2 days later she was taken back to the cath lab because of too high grade blockages, 1 in the LAD and one in the left circumflex vessel also requiring PCA and drug-eluting stent. She's done remarkably well. She is in cardiac rehabilitation.  All of this was anxiety provoking. It caused anxiety stress and insomnia. She does have a long-standing history of insomnia.  She has a history of sleep apnea. She is followed by Dr. Roddie Mc for sleep apnea and cognitive issues. She was recently seen in June and scored well on cognitive testing. 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  Had had a normal stress echo in August 2011.  Had colonoscopy by Dr. Deatra Ina 2005.  Had pneumococcal 23 immunization 2013.  Tetanus immunization 2013.  She is allergic penicillin, tetracycline, has had adverse reaction to Rozerem, Ambien and trazodone.  Past history includes tonsillectomy and adenoidectomy, appendectomy, removal of Morton's neuroma. Had herniated disc L3, 4, 5 in 1994. From time to time gets low back pain. She had tear and right supraspinatus muscle September 2003. C-section 1984. Cholecystectomy 1979. Right foot sesamoid fracture at age 6. Right knee meniscal tear 1975. Fractured pelvis 2001.  Family history:  Her mother died at age 38 with history cardiovascular disease about 18 months ago. Father died at 79 of cancer of the colon. 2 sisters in good health. One adult daughter who is an attorney in good health and one grandson.   Social history: She is married to a physician who works in [place] is a Pension scheme manager. She quit smoking over 20 years ago. Social alcohol consumption.      Review of Systems respiratory infection symptoms since June. Still has some issues with insomnia.     Objective:   Physical Exam  Constitutional: She is oriented to person, place, and time. She appears well-developed and well-nourished. No distress.  HENT:  Head: Normocephalic and atraumatic.  Right Ear: External ear normal.  Left Ear: External ear normal.  Mouth/Throat: Oropharynx is clear and moist.  Eyes: Conjunctivae and EOM are normal. Pupils are equal, round, and reactive to light. Right eye exhibits no discharge. Left eye exhibits no discharge.  Neck: Neck supple. No JVD present. No thyromegaly present.  Cardiovascular: Normal rate, regular rhythm and normal heart sounds.   No murmur heard. Pulmonary/Chest: Effort normal and breath sounds normal. She has no wheezes. She has no rales.  Abdominal: Soft. Bowel sounds are normal. She exhibits no distension and no mass. There is no tenderness. There is no rebound and no guarding.  Genitourinary:  Genitourinary Comments: Deferred to Dr. Edwinna Areola GYN  Musculoskeletal: She exhibits no edema.  Lymphadenopathy:    She has no cervical adenopathy.  Neurological: She is alert and oriented to person, place, and time. She has  normal reflexes. No cranial nerve deficit. Coordination normal.  Skin: Skin is warm and dry. No rash noted. She is not diaphoretic.  Psychiatric: She has a normal mood and affect. Her behavior is normal. Judgment and thought content normal.  Vitals reviewed.         Assessment & Plan:  Coronary artery disease status post  MI 2013 with 2 drug-eluting stents and three-vessel coronary disease  Insomnia  Anxiety  Protracted respiratory infection to be treated with Depo-Medrol IM today and Zithromax Z-PAK  History of low back pain  History of sleep apnea  Obesity-needs to work on diet and exercise  History of GE reflux  Essential hypertension-stable  Depression  Weight gain  Plan: Needs to work on diet exercise and weight loss. Continue same medications and return in 6 months  Subjective:   Patient presents for Medicare Annual/Subsequent preventive examination.  Review Past Medical/Family/Social:See above   Risk Factors  Current exercise habits: Is enrolled in cardiac rehabilitation Dietary issues discussed:  Needs to lose weight  Cardiac risk factors:History of MI, hypertension, family history   Depression Screen  (Note: if answer to either of the following is "Yes", a more complete depression screening is indicated)   Over the past two weeks, have you felt down, depressed or hopeless? No  Over the past two weeks, have you felt little interest or pleasure in doing things? yes Have you lost interest or pleasure in daily life? yes Do you often feel hopeless? No Do you cry easily over simple problems? No   Activities of Daily Living  In your present state of health, do you have any difficulty performing the following activities?:   Driving? No  Managing money? No  Feeding yourself? No  Getting from bed to chair? No  Climbing a flight of stairs? No  Preparing food and eating?: No  Bathing or showering? No  Getting dressed: No  Getting to the toilet? No  Using the toilet:No  Moving around from place to place: No  In the past year have you fallen or had a near fall?:No  Are you sexually active? No  Do you have more than one partner? No   Hearing Difficulties: No  Do you often ask people to speak up or repeat themselves? Yes Do you experience ringing or noises in your ears? No    Do you have difficulty understanding soft or whispered voices? Yes Do you feel that you have a problem with memory? No Do you often misplace items? No    Home Safety:  Do you have a smoke alarm at your residence? Yes Do you have grab bars in the bathroom?No Do you have throw rugs in your house? No   Cognitive Testing  Alert? Yes Normal Appearance?Yes  Oriented to person? Yes Place? Yes  Time? Yes  Recall of three objects? Yes  Can perform simple calculations? Yes  Displays appropriate judgment?Yes  Can read the correct time from a watch face?Yes   List the Names of Other Physician/Practitioners you currently use:  See referral list for the physicians patient is currently seeing.  Cardiologist  Neurologist  GYN physician   Review of Systems: See above  Objective:     General appearance: Appears stated age and mildly obese  Head: Normocephalic, without obvious abnormality, atraumatic  Eyes: conj clear, EOMi PEERLA  Ears: normal TM's and external ear canals both ears  Nose: Nares normal. Septum midline. Mucosa normal. No drainage or sinus tenderness.  Throat: lips, mucosa, and  tongue normal; teeth and gums normal  Neck: no adenopathy, no carotid bruit, no JVD, supple, symmetrical, trachea midline and thyroid not enlarged, symmetric, no tenderness/mass/nodules  No CVA tenderness.  Lungs: clear to auscultation bilaterally  Breasts: normal appearance, no masses or tenderness It is tender.  Heart: regular rate and rhythm, S1, S2 normal, no murmur, click, rub or gallop  Abdomen: soft, non-tender; bowel sounds normal; no masses, no organomegaly  Musculoskeletal: ROM normal in all joints, no crepitus, no deformity, Normal muscle strengthen. Back  is symmetric, no curvature. Skin: Skin color, texture, turgor normal. No rashes or lesions  Lymph nodes: Cervical, supraclavicular, and axillary nodes normal.  Neurologic: CN 2 -12 Normal, Normal symmetric reflexes. Normal  coordination and gait  Psych: Alert & Oriented x 3, Mood appear stable.    Assessment:    Annual wellness medicare exam   Plan:    During the course of the visit the patient was educated and counseled about appropriate screening and preventive services including:  Annual mammogram  Immunizations are up-to-date  Patient is to check on colonoscopy due date      Patient Instructions (the written plan) was given to the patient.  Medicare Attestation  I have personally reviewed:  The patient's medical and social history  Their use of alcohol, tobacco or illicit drugs  Their current medications and supplements  The patient's functional ability including ADLs,fall risks, home safety risks, cognitive, and hearing and visual impairment  Diet and physical activities  Evidence for depression or mood disorders  The patient's weight, height, BMI, and visual acuity have been recorded in the chart. I have made referrals, counseling, and provided education to the patient based on review of the above and I have provided the patient with a written personalized care plan for preventive services.

## 2016-07-01 NOTE — Progress Notes (Signed)
Carrie Chung 75 y.o. female 07-04-1941  Nutrition Note Spoke with pt. Pt's CBG and TG elevated and pt's MD wanted pt to see an RD. Nutrition therapy for hyperglycemia and hypertriglyceridemia discussed. Pt wants to lose wt to feel better. Wt loss tips reviewed.  Pt given handouts for 1200 kcal, 150 gm carb 5 day menu ideas and Nutrition Therapy for Hypertriglyceridemia. Pt expressed understanding of the information reviewed via feedback method. Continue client-centered nutrition education by RD as part of interdisciplinary care.  Monitor and evaluate progress toward nutrition goal with team.  Derek Mound, M.Ed, RD, LDN, CDE 07/01/2016 11:29 AM Lab Results  Component Value Date   CHOL 159 06/29/2016   HDL 76 06/29/2016   LDLCALC 51 06/29/2016   LDLDIRECT 152.4 03/09/2012   TRIG 161* 06/29/2016   CHOLHDL 2.1 06/29/2016   CMP Latest Ref Rng 06/29/2016 06/21/2015 01/25/2015  Glucose 65 - 99 mg/dL 121(H) 101(H) 88  BUN 7 - 25 mg/dL 8 11 13   Creatinine 0.60 - 0.93 mg/dL 0.73 0.81 0.82  Sodium 135 - 146 mmol/L 142 139 141  Potassium 3.5 - 5.3 mmol/L 3.7 4.1 4.1  Chloride 98 - 110 mmol/L 99 100 99  CO2 20 - 31 mmol/L 31 30 36(H)  Calcium 8.6 - 10.4 mg/dL 8.9 9.3 9.3  Total Protein 6.1 - 8.1 g/dL 6.5 - 6.6  Total Bilirubin 0.2 - 1.2 mg/dL 0.4 - 0.5  Alkaline Phos 33 - 130 U/L 66 - 71  AST 10 - 35 U/L 20 - 21  ALT 6 - 29 U/L 12 - 15

## 2016-07-02 ENCOUNTER — Other Ambulatory Visit: Payer: Self-pay

## 2016-07-02 LAB — HEMOGLOBIN A1C
Hgb A1c MFr Bld: 6.5 % — ABNORMAL HIGH (ref <5.7)
Mean Plasma Glucose: 140 mg/dL

## 2016-07-02 MED ORDER — FLUCONAZOLE 100 MG PO TABS: 100.0000 mg | ORAL_TABLET | Freq: Every day | ORAL | Status: DC

## 2016-07-02 NOTE — Telephone Encounter (Signed)
Patient contacted office requesting a single dose diflucan to take with her antibiotic. Is this ok?

## 2016-07-03 ENCOUNTER — Encounter (HOSPITAL_COMMUNITY)
Admission: RE | Admit: 2016-07-03 | Discharge: 2016-07-03 | Disposition: A | Discharge status: Home / Self Care | Payer: Self-pay | Source: Ambulatory Visit | Attending: Cardiovascular Disease | Admitting: Cardiovascular Disease

## 2016-07-06 ENCOUNTER — Encounter (HOSPITAL_COMMUNITY)
Admission: RE | Admit: 2016-07-06 | Discharge: 2016-07-06 | Disposition: A | Discharge status: Home / Self Care | Payer: Self-pay | Source: Ambulatory Visit | Attending: Cardiovascular Disease | Admitting: Cardiovascular Disease

## 2016-07-07 ENCOUNTER — Encounter: Payer: Self-pay | Admitting: *Deleted

## 2016-07-08 ENCOUNTER — Encounter (HOSPITAL_COMMUNITY)
Admission: RE | Admit: 2016-07-08 | Discharge: 2016-07-08 | Disposition: A | Discharge status: Home / Self Care | Payer: Self-pay | Source: Ambulatory Visit | Attending: Cardiovascular Disease | Admitting: Cardiovascular Disease

## 2016-07-10 ENCOUNTER — Encounter (HOSPITAL_COMMUNITY)
Admission: RE | Admit: 2016-07-10 | Discharge: 2016-07-10 | Disposition: A | Discharge status: Home / Self Care | Payer: Self-pay | Source: Ambulatory Visit | Attending: Cardiovascular Disease | Admitting: Cardiovascular Disease

## 2016-07-13 ENCOUNTER — Encounter (HOSPITAL_COMMUNITY)
Admission: RE | Admit: 2016-07-13 | Discharge: 2016-07-13 | Disposition: A | Discharge status: Home / Self Care | Payer: Self-pay | Source: Ambulatory Visit | Attending: Cardiovascular Disease | Admitting: Cardiovascular Disease

## 2016-07-15 ENCOUNTER — Encounter (HOSPITAL_COMMUNITY)
Admission: RE | Admit: 2016-07-15 | Discharge: 2016-07-15 | Disposition: A | Discharge status: Home / Self Care | Payer: Self-pay | Source: Ambulatory Visit | Attending: Cardiovascular Disease | Admitting: Cardiovascular Disease

## 2016-07-17 ENCOUNTER — Encounter (HOSPITAL_COMMUNITY)
Admission: RE | Admit: 2016-07-17 | Discharge: 2016-07-17 | Disposition: A | Discharge status: Home / Self Care | Payer: Self-pay | Source: Ambulatory Visit | Attending: Cardiovascular Disease | Admitting: Cardiovascular Disease

## 2016-07-19 NOTE — Patient Instructions (Addendum)
It was a pleasure to see you today. Continue same medications and return in 6 months.Please work on diet exercise and weight loss

## 2016-07-20 ENCOUNTER — Encounter (HOSPITAL_COMMUNITY)
Admission: RE | Admit: 2016-07-20 | Discharge: 2016-07-20 | Disposition: A | Discharge status: Home / Self Care | Payer: Self-pay | Source: Ambulatory Visit | Attending: Cardiovascular Disease | Admitting: Cardiovascular Disease

## 2016-07-22 ENCOUNTER — Encounter (HOSPITAL_COMMUNITY)
Admission: RE | Admit: 2016-07-22 | Discharge: 2016-07-22 | Disposition: A | Discharge status: Home / Self Care | Payer: Self-pay | Source: Ambulatory Visit | Attending: Cardiovascular Disease | Admitting: Cardiovascular Disease

## 2016-07-22 ENCOUNTER — Other Ambulatory Visit: Payer: Self-pay | Admitting: Pulmonary Disease

## 2016-07-22 DIAGNOSIS — I252 Old myocardial infarction: Secondary | ICD-10-CM | POA: Insufficient documentation

## 2016-07-22 DIAGNOSIS — Z955 Presence of coronary angioplasty implant and graft: Secondary | ICD-10-CM | POA: Insufficient documentation

## 2016-07-22 DIAGNOSIS — J9611 Chronic respiratory failure with hypoxia: Secondary | ICD-10-CM

## 2016-07-24 ENCOUNTER — Encounter: Payer: Self-pay | Admitting: Cardiovascular Disease

## 2016-07-24 ENCOUNTER — Encounter (HOSPITAL_COMMUNITY): Payer: Self-pay

## 2016-07-24 ENCOUNTER — Ambulatory Visit (INDEPENDENT_AMBULATORY_CARE_PROVIDER_SITE_OTHER): Payer: Medicare Other | Admitting: Cardiovascular Disease

## 2016-07-24 VITALS — BP 146/88 | HR 70 | Ht 61.0 in | Wt 164.0 lb

## 2016-07-24 DIAGNOSIS — I10 Essential (primary) hypertension: Secondary | ICD-10-CM | POA: Diagnosis not present

## 2016-07-24 DIAGNOSIS — E785 Hyperlipidemia, unspecified: Secondary | ICD-10-CM | POA: Diagnosis not present

## 2016-07-24 DIAGNOSIS — I2510 Atherosclerotic heart disease of native coronary artery without angina pectoris: Secondary | ICD-10-CM | POA: Diagnosis not present

## 2016-07-24 MED ORDER — HYDROCHLOROTHIAZIDE 25 MG PO TABS: 25.0000 mg | ORAL_TABLET | Freq: Every day | ORAL | 3 refills | Status: DC

## 2016-07-24 NOTE — Progress Notes (Signed)
Cardiology Office Note Date:  07/24/2016   ID:  Carrie, Chung [birth year], MRN [id number]  PCP:  [name], MD  Cardiologist:  [name], MD    Chief Complaint  Patient presents with  . Follow-up    CAD  . Palpitations   History of Present Illness: Carrie Chung is a 75 y.o. female who presents for Follow-up of coronary artery disease. She underwent multivessel stenting in April 2013 after presenting with a non-ST elevation MI. She was treated with stenting of the right coronary artery initially followed by staged PCI of the LAD and left circumflex. She was treated with drug-eluting stents. Her left ventricular function was normal with an EF of 60%. She had a stress Myoview scan in December 2013 and this showed normal perfusion and normal LV function. She again presented with chest pain in May 2015 and was evaluated with observation in the hospital. She ruled out for myocardial infarction and underwent a stress echocardiogram demonstrating no ischemic changes.  Overall the patient is doing well. She does complain of heart palpitations. There are no other associated symptoms. These occur about twice per week. She is now using oxygen at nighttime. She has undergone overnight oximetry and other studies to evaluate for potential causes of hypoxemia but no clear cause has been found.  The patient denies any symptoms of exertional chest pain or pressure. She denies shortness of breath, orthopnea, or PND. She is compliant with her medications. States home blood pressures generally range from 0000000 systolic.   Past Medical History:  Diagnosis Date  . Atrial tachycardia (HCC)    a. noted at cardiac rehab 5/13; event monitor ordered to assess for AFib  . CAD (coronary artery disease)    a. NSTEMI 03/2012 (Andrew 03/27/12: pLAD 30%, mLAD 99%, mCFX 95%, mRCA 99% with thrombus, EF 65%);  s/p PTCA/DESx1 to prox-mid RCA 03/27/12 urgently in setting of hypotension/bradycardia, with  staged PTCA/Evolve study stent to mid LAD & PTCA/Evolve study stent to prox LCx 03/29/12 ;   echo 03/28/12:EF 60%, Aortic sclerosis without AS, mild RVE, mild reduced RVSF    . Endometrial polyp   . HTN (hypertension)   . Hyperglycemia   . Hyperlipidemia   . Hypokalemia   . MI, old   . Sleep apnea     Past Surgical History:  Procedure Laterality Date  . APPENDECTOMY  75 years old  . CAROTID STENT  04/2012   x3  . CESAREAN SECTION  1984  . CHOLECYSTECTOMY  1978  . LEFT HEART CATHETERIZATION WITH CORONARY ANGIOGRAM N/A 03/27/2012   Procedure: LEFT HEART CATHETERIZATION WITH CORONARY ANGIOGRAM;  Surgeon: Burnell Blanks, MD;  Location: [facility] CATH LAB;  Service: Cardiovascular;  Laterality: N/A;  . PERCUTANEOUS CORONARY STENT INTERVENTION (PCI-S) N/A 03/27/2012   Procedure: PERCUTANEOUS CORONARY STENT INTERVENTION (PCI-S);  Surgeon: Burnell Blanks, MD;  Location: [facility] CATH LAB;  Service: Cardiovascular;  Laterality: N/A;  . PERCUTANEOUS CORONARY STENT INTERVENTION (PCI-S) N/A 03/29/2012   Procedure: PERCUTANEOUS CORONARY STENT INTERVENTION (PCI-S);  Surgeon: [name], MD;  Location: [facility] CATH LAB;  Service: Cardiovascular;  Laterality: N/A;  . TONSILLECTOMY  75 years old    Current Outpatient Prescriptions  Medication Sig Dispense Refill  . albuterol (PROVENTIL HFA;VENTOLIN HFA) 108 (90 BASE) MCG/ACT inhaler Inhale 2 puffs into the lungs every 6 (six) hours as needed for wheezing or shortness of breath. 1 Inhaler 2  . ALPRAZolam (XANAX) 1 MG tablet Take 1 tablet (1 mg total) by mouth  at bedtime as needed. 30 tablet 5  . amLODipine (NORVASC) 5 MG tablet Take 1 tab daily 90 tablet 1  . aspirin 81 MG tablet Take 1 tablet (81 mg total) by mouth daily.    . Calcium Carbonate-Vitamin D (CALTRATE 600+D) 600-400 MG-UNIT per tablet Take 1 tablet by mouth 2 (two) times daily.     . clopidogrel (PLAVIX) 75 MG tablet TAKE 1 TABLET (75 MG TOTAL) BY MOUTH DAILY. 90 tablet 2  . CRESTOR 10 MG tablet  TAKE 1 TABLET (10 MG TOTAL) BY MOUTH DAILY. 90 tablet 3  . desvenlafaxine (PRISTIQ) 50 MG 24 hr tablet Take 1 tablet (50 mg total) by mouth daily. 90 tablet 1  . esomeprazole (NEXIUM) 40 MG capsule Take 40 mg by mouth daily at 12 noon.    . folic acid-pyridoxine-cyancobalamin (FOLBIC) 2.5-25-2 MG TABS tablet Take 1 tablet by mouth daily. 90 tablet 3  . hydrochlorothiazide (HYDRODIURIL) 25 MG tablet Take 1 tablet (25 mg total) by mouth daily. 90 tablet 3  . HYDROcodone-acetaminophen (NORCO) 10-325 MG per tablet Take 1 tablet by mouth every 8 (eight) hours as needed for severe pain. 90 tablet 0  . metoprolol (LOPRESSOR) 50 MG tablet Take 50 mg by mouth 2 (two) times daily.    Marland Kitchen NITROSTAT 0.4 MG SL tablet PLACE 1 TAB UNDER TONGUE EVERY 5 MINUTES FOR UP TO 3 DOSES AS NEEDED FOR CHEST PAIN 25 tablet 1  . NON FORMULARY Oxygen 2L at night     No current facility-administered medications for this visit.     Allergies:   Penicillins and Sulfonamide derivatives   Social History:  The patient  reports that she quit smoking about 36 years ago. Her smoking use included Cigarettes. She has never used smokeless tobacco. She reports that she drinks about 12.0 oz of alcohol per week . She reports that she does not use drugs.   Family History:  The patient's  family history includes Cancer in her father; Coronary artery disease in her mother.    ROS:  Please see the history of present illness.  Otherwise, review of systems is positive for cough, palpitations, wheezing.  All other systems are reviewed and negative.    PHYSICAL EXAM: VS:  BP (!) 146/88 (BP Location: Left Arm, Patient Position: Sitting, Cuff Size: Normal)   Pulse 70   Ht 5\' 1"  (1.549 m)   Wt 164 lb (74.4 kg)   BMI 30.99 kg/m  , BMI Body mass index is 30.99 kg/m. GEN: Well nourished, well developed, in no acute distress  HEENT: normal  Neck: no JVD, no masses. No carotid bruits Cardiac: RRR without murmur or gallop                  Respiratory:  clear to auscultation bilaterally, normal work of breathing GI: soft, nontender, nondistended, + BS MS: no deformity or atrophy  Ext: no pretibial edema, pedal pulses 2+= bilaterally Skin: warm and dry, no rash Neuro:  Strength and sensation are intact Psych: euthymic mood, full affect  EKG:  EKG is ordered today. The ekg ordered today shows normal sinus rhythm 70 bpm, within normal limits.  Recent Labs: 06/29/2016: ALT 12; BUN 8; Creat 0.73; Hemoglobin 13.3; Platelets 149; Potassium 3.7; Sodium 142; TSH 1.59   Lipid Panel     Component Value Date/Time   CHOL 159 06/29/2016 1313   TRIG 161 (H) 06/29/2016 1313   HDL 76 06/29/2016 1313   CHOLHDL 2.1 06/29/2016 1313   VLDL  32 (H) 06/29/2016 1313   LDLCALC 51 06/29/2016 1313   LDLDIRECT 152.4 03/09/2012 0900      Wt Readings from Last 3 Encounters:  07/24/16 164 lb (74.4 kg)  06/30/16 171 lb 8 oz (77.8 kg)  06/02/16 167 lb (75.8 kg)     Cardiac Studies Reviewed: CXR 6.15.2016: IMPRESSION: No active cardiopulmonary disease.  CTA Chest 7.1.2016: IMPRESSION: 1. No evidence of a pulmonary embolus. 2. No acute findings. 3. Mild centrilobular emphysema. Mild dependent and lung base subsegmental atelectasis. No evidence of pneumonia or pulmonary edema.  Echo with agitated saline 6.29.2016: Study Conclusions  - Left ventricle: The cavity size was normal. Wall thickness was normal. Systolic function was normal. The estimated ejection fraction was in the range of 60% to 65%. Wall motion was normal; there were no regional wall motion abnormalities. Doppler parameters are consistent with abnormal left ventricular relaxation (grade 1 diastolic dysfunction). - Mitral valve: Calcified annulus. Normal thickness leaflets . - Atrial septum: No defect or patent foramen ovale was identified. Echo contrast study showed no right-to-left atrial level shunt, at baseline or with provocation.  Stress Echo  05-01-2014: Stress echo results:   Left ventricular ejection fraction was normal at rest and with stress. Normal echo stress  ASSESSMENT AND PLAN: 1.  Coronary artery disease, native vessel, with angina: Currently with CCS class I symptoms. She seems to be doing well and is not having any exertional angina on her current medical regimen of amlodipine and metoprolol. She continues on aspirin and a high intensity statin drug.  2. Essential hypertension: Home blood pressures are within range. Medications are reviewed and no changes recommended today.  3. Heart palpitations: Discussed diagnostic and treatment options. At the time of her last EKG she had a few PVCs. In 2013 she underwent an event monitor that showed no arrhythmia. Suspect she has had symptomatic PVCs. We discussed increasing her rate of blocker. She declines for now. We'll continue same medicines and follow her symptoms. I will see her back in 6 months.  4. Hyperlipidemia: Treated with Crestor 10 mg daily. Most recent lipids reviewed. LDL is at goal at 51 mg/dL.  Current medicines are reviewed with the patient today.  The patient does not have concerns regarding medicines.  Labs/ tests ordered today include:   Orders Placed This Encounter  Procedures  . EKG 12-Lead    Disposition:   FU 6 months  Signed, [name], MD  07/24/2016 10:59 AM    [facility] [address], [place], [place]  [address] Phone: [phone]; Fax: [phone]

## 2016-07-24 NOTE — Patient Instructions (Signed)

## 2016-07-27 ENCOUNTER — Encounter (HOSPITAL_COMMUNITY)
Admission: RE | Admit: 2016-07-27 | Discharge: 2016-07-27 | Disposition: A | Discharge status: Home / Self Care | Payer: Self-pay | Source: Ambulatory Visit | Attending: Cardiovascular Disease | Admitting: Cardiovascular Disease

## 2016-07-29 ENCOUNTER — Encounter (HOSPITAL_COMMUNITY): Payer: Self-pay

## 2016-07-31 ENCOUNTER — Encounter (HOSPITAL_COMMUNITY): Payer: Self-pay

## 2016-08-03 ENCOUNTER — Encounter (HOSPITAL_COMMUNITY): Payer: Self-pay

## 2016-08-05 ENCOUNTER — Encounter (HOSPITAL_COMMUNITY): Payer: Self-pay

## 2016-08-07 ENCOUNTER — Encounter (HOSPITAL_COMMUNITY): Payer: Self-pay

## 2016-08-09 ENCOUNTER — Other Ambulatory Visit: Payer: Self-pay | Admitting: Internal Medicine

## 2016-08-10 ENCOUNTER — Encounter (HOSPITAL_COMMUNITY): Payer: Self-pay

## 2016-08-12 ENCOUNTER — Encounter (HOSPITAL_COMMUNITY): Payer: Self-pay

## 2016-08-14 ENCOUNTER — Encounter (HOSPITAL_COMMUNITY): Payer: Self-pay

## 2016-08-17 ENCOUNTER — Encounter (HOSPITAL_COMMUNITY)
Admission: RE | Admit: 2016-08-17 | Discharge: 2016-08-17 | Disposition: A | Discharge status: Home / Self Care | Payer: Self-pay | Source: Ambulatory Visit | Attending: Cardiovascular Disease | Admitting: Cardiovascular Disease

## 2016-08-19 ENCOUNTER — Encounter (HOSPITAL_COMMUNITY)
Admission: RE | Admit: 2016-08-19 | Discharge: 2016-08-19 | Disposition: A | Discharge status: Home / Self Care | Payer: Self-pay | Source: Ambulatory Visit | Attending: Cardiovascular Disease | Admitting: Cardiovascular Disease

## 2016-08-19 ENCOUNTER — Other Ambulatory Visit: Payer: Self-pay

## 2016-08-21 ENCOUNTER — Encounter (HOSPITAL_COMMUNITY): Payer: Self-pay

## 2016-08-21 DIAGNOSIS — Z955 Presence of coronary angioplasty implant and graft: Secondary | ICD-10-CM | POA: Insufficient documentation

## 2016-08-21 DIAGNOSIS — I252 Old myocardial infarction: Secondary | ICD-10-CM | POA: Insufficient documentation

## 2016-08-26 ENCOUNTER — Encounter (HOSPITAL_COMMUNITY): Payer: Self-pay

## 2016-08-28 ENCOUNTER — Encounter (HOSPITAL_COMMUNITY)
Admission: RE | Admit: 2016-08-28 | Discharge: 2016-08-28 | Disposition: A | Discharge status: Home / Self Care | Payer: Self-pay | Source: Ambulatory Visit | Attending: Cardiovascular Disease | Admitting: Cardiovascular Disease

## 2016-08-31 ENCOUNTER — Encounter (HOSPITAL_COMMUNITY): Payer: Self-pay

## 2016-09-02 ENCOUNTER — Encounter (HOSPITAL_COMMUNITY): Payer: Self-pay

## 2016-09-04 ENCOUNTER — Encounter (HOSPITAL_COMMUNITY): Payer: Self-pay

## 2016-09-07 ENCOUNTER — Encounter (HOSPITAL_COMMUNITY): Payer: Self-pay

## 2016-09-09 ENCOUNTER — Encounter (HOSPITAL_COMMUNITY): Payer: Self-pay

## 2016-09-11 ENCOUNTER — Encounter (HOSPITAL_COMMUNITY): Payer: Self-pay

## 2016-09-14 ENCOUNTER — Encounter (HOSPITAL_COMMUNITY): Payer: Self-pay

## 2016-09-14 ENCOUNTER — Telehealth: Payer: Self-pay | Admitting: Cardiovascular Disease

## 2016-09-14 NOTE — Telephone Encounter (Signed)
Discussed with Dr Burt Knack and the pt is okay to use TENS unit.  Pt aware.

## 2016-09-14 NOTE — Telephone Encounter (Signed)
New message:    Is it alright to use a tens unit for her back pain?

## 2016-09-16 ENCOUNTER — Encounter (HOSPITAL_COMMUNITY): Payer: Self-pay

## 2016-09-17 ENCOUNTER — Telehealth: Payer: Self-pay | Admitting: Internal Medicine

## 2016-09-17 NOTE — Telephone Encounter (Signed)
States that she's having trouble with her back again.  Says that you are quite aware of this pain that she has that comes and goes.  She was moving a friend who has Parkinson's and the friend fell on her.  She was not braced at all and she's quite sure that this has aggravated her back pretty badly.  She had and MRI about 2 years ago and she has reviewed the MRI with her husband and noted that the right lower back is the side that was noted on the MRI and that happens to be the side that is quite painful at this time as well.    She would like to know if you feel you need to see her or what the best thing to do is at this moment.  States she has done Prednisone in the past, but is willing to do whatever you feel is best because she's in quite a bit of pain at the moment.    Best contact #:  [phone]

## 2016-09-17 NOTE — Telephone Encounter (Signed)
OV tomorrow. We have only about 10 min to spend but need to see her.

## 2016-09-17 NOTE — Telephone Encounter (Signed)
Spoke with patient; provided appointment for tomorrow, 09/18/16 @ 11:30.  Advised this would be a short appointment.  Patient verbalized understanding of this information.  Patient is appreciative of the time.

## 2016-09-18 ENCOUNTER — Encounter (HOSPITAL_COMMUNITY): Payer: Self-pay

## 2016-09-18 ENCOUNTER — Ambulatory Visit (INDEPENDENT_AMBULATORY_CARE_PROVIDER_SITE_OTHER): Payer: Medicare Other | Admitting: Internal Medicine

## 2016-09-18 ENCOUNTER — Encounter: Payer: Self-pay | Admitting: Internal Medicine

## 2016-09-18 VITALS — BP 130/86 | HR 82 | Temp 98.7°F | Wt 159.0 lb

## 2016-09-18 DIAGNOSIS — M4806 Spinal stenosis, lumbar region: Secondary | ICD-10-CM | POA: Diagnosis not present

## 2016-09-18 DIAGNOSIS — I2510 Atherosclerotic heart disease of native coronary artery without angina pectoris: Secondary | ICD-10-CM

## 2016-09-18 DIAGNOSIS — M5431 Sciatica, right side: Secondary | ICD-10-CM | POA: Diagnosis not present

## 2016-09-18 DIAGNOSIS — M48061 Spinal stenosis, lumbar region without neurogenic claudication: Secondary | ICD-10-CM

## 2016-09-18 MED ORDER — PREDNISONE 10 MG PO TABS
ORAL_TABLET | ORAL | 0 refills | Status: DC
Start: 2016-09-18 — End: 2016-09-28

## 2016-09-18 MED ORDER — HYDROCODONE-ACETAMINOPHEN 10-325 MG PO TABS: 1.0000 | ORAL_TABLET | Freq: Three times a day (TID) | ORAL | 0 refills | Status: DC | PRN

## 2016-09-18 NOTE — Progress Notes (Signed)
   Subjective:    Patient ID: Carrie Chung, female    DOB: Jun 16, 1941, 75 y.o.   MRN: [id number]  HPI Patient was helping a friend of hers move to a reclining chair some 5 weeks ago and evidently strained her back. Recently back pain has gotten worse. It is right sided radiating into right buttock. Hurts with minimal movement. Can't get comfortable at night in bed. This position is standing.  History of recurrent low back pain but not recently.  Has had MRI in the past. This was done in 2014 and showed central herniation L1-L2 indenting the thecal sac slightly. L2-L3 desiccation and bulging of the disc. L3-L4 desiccation and bulging of the disc. Mild multifactorial stenosis.  L4-L5 broad-based disc herniation moving towards the right. Stenosis of both lateral recesses right worse than left. Neural compression likely at that level.  L5-S1 desiccation and bulging of the disc.   Review of Systems     Objective:   Physical Exam Straight leg raising is positive on the right and negative on the left. Muscle strength is normal and deep tendon reflexes 1+ and symmetrical in the knees.       Assessment & Plan:  Right sciatica-L4-L5  Plan: Sterapred DS 10 mg 6 day dosepak take as directed. Vicodin 10/325 one by mouth every 8 hours when necessary pain.  Doesn't want muscle relaxant. Doesn't do well with Flexeril.  No heavy lifting pushing or pulling. Reevaluate in 2 weeks if pain is persistent. May need epidural steroid injection.

## 2016-09-18 NOTE — Patient Instructions (Signed)
Sterapred DS 10 mg 6 day dosepak take as directed. Vicodin 10/325 sparingly every 8 hours when necessary pain. No heavy lifting pushing or pulling.

## 2016-09-21 ENCOUNTER — Encounter (HOSPITAL_COMMUNITY): Payer: Self-pay

## 2016-09-21 DIAGNOSIS — Z955 Presence of coronary angioplasty implant and graft: Secondary | ICD-10-CM | POA: Insufficient documentation

## 2016-09-21 DIAGNOSIS — I252 Old myocardial infarction: Secondary | ICD-10-CM | POA: Insufficient documentation

## 2016-09-23 ENCOUNTER — Encounter (HOSPITAL_COMMUNITY)
Admission: RE | Admit: 2016-09-23 | Discharge: 2016-09-23 | Disposition: A | Discharge status: Home / Self Care | Payer: Self-pay | Source: Ambulatory Visit | Attending: Cardiovascular Disease | Admitting: Cardiovascular Disease

## 2016-09-24 ENCOUNTER — Other Ambulatory Visit: Payer: Medicare Other | Admitting: Internal Medicine

## 2016-09-25 ENCOUNTER — Encounter (HOSPITAL_COMMUNITY)
Admission: RE | Admit: 2016-09-25 | Discharge: 2016-09-25 | Disposition: A | Discharge status: Home / Self Care | Payer: Self-pay | Source: Ambulatory Visit | Attending: Cardiovascular Disease | Admitting: Cardiovascular Disease

## 2016-09-25 ENCOUNTER — Ambulatory Visit: Payer: Medicare Other | Admitting: Internal Medicine

## 2016-09-25 ENCOUNTER — Other Ambulatory Visit (INDEPENDENT_AMBULATORY_CARE_PROVIDER_SITE_OTHER): Payer: Medicare Other | Admitting: Internal Medicine

## 2016-09-25 DIAGNOSIS — Z131 Encounter for screening for diabetes mellitus: Secondary | ICD-10-CM

## 2016-09-25 DIAGNOSIS — R7302 Impaired glucose tolerance (oral): Secondary | ICD-10-CM | POA: Diagnosis not present

## 2016-09-25 LAB — HEMOGLOBIN A1C
Hgb A1c MFr Bld: 5.9 % — ABNORMAL HIGH (ref <5.7)
MEAN PLASMA GLUCOSE: 123 mg/dL

## 2016-09-28 ENCOUNTER — Ambulatory Visit (INDEPENDENT_AMBULATORY_CARE_PROVIDER_SITE_OTHER): Payer: Medicare Other | Admitting: Internal Medicine

## 2016-09-28 ENCOUNTER — Encounter (HOSPITAL_COMMUNITY): Payer: Self-pay

## 2016-09-28 ENCOUNTER — Encounter: Payer: Self-pay | Admitting: Internal Medicine

## 2016-09-28 VITALS — BP 134/86 | HR 96 | Temp 98.9°F | Wt 159.0 lb

## 2016-09-28 DIAGNOSIS — M48062 Spinal stenosis, lumbar region with neurogenic claudication: Secondary | ICD-10-CM | POA: Diagnosis not present

## 2016-09-28 DIAGNOSIS — R7302 Impaired glucose tolerance (oral): Secondary | ICD-10-CM

## 2016-09-28 DIAGNOSIS — M5431 Sciatica, right side: Secondary | ICD-10-CM | POA: Diagnosis not present

## 2016-09-28 DIAGNOSIS — I2510 Atherosclerotic heart disease of native coronary artery without angina pectoris: Secondary | ICD-10-CM

## 2016-09-28 DIAGNOSIS — I252 Old myocardial infarction: Secondary | ICD-10-CM

## 2016-09-28 MED ORDER — PREDNISONE 10 MG PO TABS: ORAL_TABLET | ORAL | 0 refills | Status: DC

## 2016-09-28 NOTE — Progress Notes (Signed)
   Subjective:    Patient ID: CHARDE RENSLOW, female    DOB: 03-Nov-1941, 75 y.o.   MRN: [id number]  HPI 75 year old Female for 6 month follow up. Has lost 12 pounds since July. Back pain improved with 6 day taper of prednisone.Still has some low back pain with certain movement. Today, is  here for 3 month follow up of impaired glucose tolerance. I am pleased with her weight loss. She's really been trying to watch her diet.  Hemoglobin A1c is excellent    Review of Systems as above     Objective:   Physical Exam Still has some straight leg raising positivity on the right. Chest clear. Cardiac exam regular rate and rhythm. No JVD thyromegaly or carotid bruits.       Assessment & Plan:  Low back pain with radiculopathy with history of spinal stenosis.  Impaired glucose tolerance-in July hemoglobin A1c was 6.5% and is now 5.9%.  History of heart disease  Plan: In late September, we gave her 2 hydrocodone/APAP 10/325 which should suffice for a while with chronic recurrent back pain with radiculopathy. She is status post prednisone taper for 6 days starting on 09/28/2016.  Return in April 2018 for physical examination.

## 2016-09-30 ENCOUNTER — Encounter (HOSPITAL_COMMUNITY)
Admission: RE | Admit: 2016-09-30 | Discharge: 2016-09-30 | Disposition: A | Discharge status: Home / Self Care | Payer: Self-pay | Source: Ambulatory Visit | Attending: Cardiovascular Disease | Admitting: Cardiovascular Disease

## 2016-10-02 ENCOUNTER — Encounter (HOSPITAL_COMMUNITY)
Admission: RE | Admit: 2016-10-02 | Discharge: 2016-10-02 | Disposition: A | Discharge status: Home / Self Care | Payer: Self-pay | Source: Ambulatory Visit | Attending: Cardiovascular Disease | Admitting: Cardiovascular Disease

## 2016-10-05 ENCOUNTER — Encounter (HOSPITAL_COMMUNITY): Payer: Self-pay

## 2016-10-07 ENCOUNTER — Encounter (HOSPITAL_COMMUNITY)
Admission: RE | Admit: 2016-10-07 | Discharge: 2016-10-07 | Disposition: A | Discharge status: Home / Self Care | Payer: Self-pay | Source: Ambulatory Visit | Attending: Cardiovascular Disease | Admitting: Cardiovascular Disease

## 2016-10-07 ENCOUNTER — Other Ambulatory Visit: Payer: Self-pay | Admitting: Internal Medicine

## 2016-10-08 DIAGNOSIS — D1801 Hemangioma of skin and subcutaneous tissue: Secondary | ICD-10-CM | POA: Diagnosis not present

## 2016-10-09 ENCOUNTER — Encounter (HOSPITAL_COMMUNITY): Payer: Self-pay

## 2016-10-12 ENCOUNTER — Encounter (HOSPITAL_COMMUNITY): Payer: Self-pay

## 2016-10-14 ENCOUNTER — Encounter (HOSPITAL_COMMUNITY)
Admission: RE | Admit: 2016-10-14 | Discharge: 2016-10-14 | Disposition: A | Discharge status: Home / Self Care | Payer: Self-pay | Source: Ambulatory Visit | Attending: Cardiovascular Disease | Admitting: Cardiovascular Disease

## 2016-10-15 ENCOUNTER — Other Ambulatory Visit: Payer: Self-pay | Admitting: Cardiovascular Disease

## 2016-10-16 ENCOUNTER — Encounter (HOSPITAL_COMMUNITY): Payer: Self-pay

## 2016-10-17 NOTE — Patient Instructions (Signed)
Continue hydrocodone/APAP sparingly for back pain. Try to avoid heavy lifting. Continue to work on diet and exercise regimen. I am pleased with hemoglobin A1c results. Return in April for physical exam.

## 2016-10-19 ENCOUNTER — Encounter (HOSPITAL_COMMUNITY): Payer: Self-pay

## 2016-10-23 ENCOUNTER — Encounter: Payer: Self-pay | Admitting: Pulmonary Disease

## 2016-10-23 ENCOUNTER — Ambulatory Visit (INDEPENDENT_AMBULATORY_CARE_PROVIDER_SITE_OTHER): Payer: Medicare Other | Admitting: Pulmonary Disease

## 2016-10-23 DIAGNOSIS — I2510 Atherosclerotic heart disease of native coronary artery without angina pectoris: Secondary | ICD-10-CM

## 2016-10-23 DIAGNOSIS — Z23 Encounter for immunization: Secondary | ICD-10-CM | POA: Diagnosis not present

## 2016-10-23 DIAGNOSIS — R0902 Hypoxemia: Secondary | ICD-10-CM

## 2016-10-23 NOTE — Patient Instructions (Signed)
Flu shot today Check oximetry during sleep on room air

## 2016-10-23 NOTE — Progress Notes (Signed)
   Subjective:    Patient ID: Carrie Chung, female    DOB: 25-Mar-1941, 75 y.o.   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 She is in maintenance cardiac rehabilitation. She has noted herself to desaturate after exertion as low as 91%. She has restless leg syndrome which is improved off Wellbutrin  10/23/2016  Chief Complaint  Patient presents with  . Follow-up    Pt. states her breathing is doing well,unless she needs to up steps or lifting heavy objects, She is feeling congested, Pt. denies chest pain    Compliant with nocturnal oxygen- She stopped using for 8 days on her trip to [place] and felt like she was dragging She continues with maintenance cardiac rehabilitation-desaturates to low 90s  She has occasional dyspnea and mild dry cough on occasion No pedal edema or orthopnea   she desaturates to 91% on brisk walking 3 laps around the office  Significant tests/ events  03/2012 echo normal EF 04/2014 exercise stress test normal 04/2014, 05/2015  CXR normal ONO on RA 09/2015 -  Continues to desaturate x 400 minutes  PSG 04/2015 (dohmeier)- did not show significant OSA -AHI 4.5, RDI 7.6/hwhich showed desaturation for 106 minutes.   PFT  nml lung function. No airflow obstruction/restriction. DLCO nml .  CT angio chest neg for PE, mild emphysema .  Bubble echo with gg 1 DD , EF ok , no shunt.    Review of Systems Patient denies significant dyspnea,cough, hemoptysis,  chest pain, palpitations, pedal edema, orthopnea, paroxysmal nocturnal dyspnea, lightheadedness, nausea, vomiting,  abdominal or  leg pains      Objective:   Physical Exam  Gen. Pleasant, well-nourished, in no distress ENT - no lesions, no post nasal drip Neck: No JVD, no thyromegaly, no carotid bruits Lungs: no use of accessory muscles, no dullness to percussion, clear without rales or rhonchi  Cardiovascular: Rhythm regular, heart sounds  normal, no murmurs or gallops, no peripheral edema Musculoskeletal: No deformities, no cyanosis or clubbing        Assessment & Plan:

## 2016-10-23 NOTE — Addendum Note (Signed)
Addended by: Benson Setting L on: 10/23/2016 04:58 PM   Modules accepted: Orders

## 2016-10-23 NOTE — Assessment & Plan Note (Signed)
Nocturnal hypoxia remains unexplained-she does not have any evidence of hypoventilation or OSA  We will reassess nocturnal oximetry on room air She will continue with rehabilitation maintenance

## 2016-10-26 ENCOUNTER — Encounter (HOSPITAL_COMMUNITY)
Admission: RE | Admit: 2016-10-26 | Discharge: 2016-10-26 | Disposition: A | Discharge status: Home / Self Care | Payer: Self-pay | Source: Ambulatory Visit | Attending: Cardiovascular Disease | Admitting: Cardiovascular Disease

## 2016-10-26 DIAGNOSIS — I252 Old myocardial infarction: Secondary | ICD-10-CM | POA: Insufficient documentation

## 2016-10-26 DIAGNOSIS — Z955 Presence of coronary angioplasty implant and graft: Secondary | ICD-10-CM | POA: Insufficient documentation

## 2016-10-30 ENCOUNTER — Encounter (HOSPITAL_COMMUNITY)
Admission: RE | Admit: 2016-10-30 | Discharge: 2016-10-30 | Disposition: A | Discharge status: Home / Self Care | Payer: Self-pay | Source: Ambulatory Visit | Attending: Cardiovascular Disease | Admitting: Cardiovascular Disease

## 2016-11-02 ENCOUNTER — Encounter (HOSPITAL_COMMUNITY)
Admission: RE | Admit: 2016-11-02 | Discharge: 2016-11-02 | Disposition: A | Discharge status: Home / Self Care | Payer: Self-pay | Source: Ambulatory Visit | Attending: Cardiovascular Disease | Admitting: Cardiovascular Disease

## 2016-11-04 ENCOUNTER — Encounter (HOSPITAL_COMMUNITY)
Admission: RE | Admit: 2016-11-04 | Discharge: 2016-11-04 | Disposition: A | Discharge status: Home / Self Care | Payer: Self-pay | Source: Ambulatory Visit | Attending: Cardiovascular Disease | Admitting: Cardiovascular Disease

## 2016-11-06 ENCOUNTER — Encounter (HOSPITAL_COMMUNITY)
Admission: RE | Admit: 2016-11-06 | Discharge: 2016-11-06 | Disposition: A | Discharge status: Home / Self Care | Payer: Self-pay | Source: Ambulatory Visit | Attending: Cardiovascular Disease | Admitting: Cardiovascular Disease

## 2016-11-09 ENCOUNTER — Encounter (HOSPITAL_COMMUNITY): Payer: Self-pay

## 2016-11-11 ENCOUNTER — Encounter (HOSPITAL_COMMUNITY): Payer: Self-pay

## 2016-11-16 ENCOUNTER — Encounter (HOSPITAL_COMMUNITY): Payer: Self-pay

## 2016-11-17 DIAGNOSIS — R0602 Shortness of breath: Secondary | ICD-10-CM | POA: Diagnosis not present

## 2016-11-17 DIAGNOSIS — R0600 Dyspnea, unspecified: Secondary | ICD-10-CM | POA: Diagnosis not present

## 2016-11-18 ENCOUNTER — Encounter (HOSPITAL_COMMUNITY): Payer: Self-pay

## 2016-11-18 DIAGNOSIS — M79644 Pain in right finger(s): Secondary | ICD-10-CM | POA: Diagnosis not present

## 2016-11-20 ENCOUNTER — Encounter (HOSPITAL_COMMUNITY): Payer: Self-pay

## 2016-11-20 DIAGNOSIS — I252 Old myocardial infarction: Secondary | ICD-10-CM | POA: Insufficient documentation

## 2016-11-20 DIAGNOSIS — Z955 Presence of coronary angioplasty implant and graft: Secondary | ICD-10-CM | POA: Insufficient documentation

## 2016-11-23 ENCOUNTER — Encounter (HOSPITAL_COMMUNITY)
Admission: RE | Admit: 2016-11-23 | Discharge: 2016-11-23 | Disposition: A | Discharge status: Home / Self Care | Payer: Self-pay | Source: Ambulatory Visit | Attending: Cardiovascular Disease | Admitting: Cardiovascular Disease

## 2016-11-24 DIAGNOSIS — M25649 Stiffness of unspecified hand, not elsewhere classified: Secondary | ICD-10-CM | POA: Diagnosis not present

## 2016-11-25 ENCOUNTER — Encounter (HOSPITAL_COMMUNITY)
Admission: RE | Admit: 2016-11-25 | Discharge: 2016-11-25 | Disposition: A | Discharge status: Home / Self Care | Payer: Self-pay | Source: Ambulatory Visit | Attending: Cardiovascular Disease | Admitting: Cardiovascular Disease

## 2016-11-27 ENCOUNTER — Encounter (HOSPITAL_COMMUNITY): Payer: Self-pay

## 2016-11-30 ENCOUNTER — Encounter (HOSPITAL_COMMUNITY): Payer: Self-pay

## 2016-11-30 DIAGNOSIS — M25649 Stiffness of unspecified hand, not elsewhere classified: Secondary | ICD-10-CM | POA: Diagnosis not present

## 2016-12-02 ENCOUNTER — Encounter (HOSPITAL_COMMUNITY)
Admission: RE | Admit: 2016-12-02 | Discharge: 2016-12-02 | Disposition: A | Discharge status: Home / Self Care | Payer: Self-pay | Source: Ambulatory Visit | Attending: Cardiovascular Disease | Admitting: Cardiovascular Disease

## 2016-12-02 ENCOUNTER — Other Ambulatory Visit: Payer: Self-pay | Admitting: Internal Medicine

## 2016-12-02 DIAGNOSIS — M25649 Stiffness of unspecified hand, not elsewhere classified: Secondary | ICD-10-CM | POA: Diagnosis not present

## 2016-12-04 ENCOUNTER — Encounter (HOSPITAL_COMMUNITY)
Admission: RE | Admit: 2016-12-04 | Discharge: 2016-12-04 | Disposition: A | Discharge status: Home / Self Care | Payer: Self-pay | Source: Ambulatory Visit | Attending: Cardiovascular Disease | Admitting: Cardiovascular Disease

## 2016-12-07 ENCOUNTER — Encounter (HOSPITAL_COMMUNITY): Payer: Self-pay

## 2016-12-07 DIAGNOSIS — M25649 Stiffness of unspecified hand, not elsewhere classified: Secondary | ICD-10-CM | POA: Diagnosis not present

## 2016-12-09 ENCOUNTER — Other Ambulatory Visit: Payer: Self-pay | Admitting: Cardiovascular Disease

## 2016-12-09 ENCOUNTER — Encounter (HOSPITAL_COMMUNITY)
Admission: RE | Admit: 2016-12-09 | Discharge: 2016-12-09 | Disposition: A | Discharge status: Home / Self Care | Payer: Self-pay | Source: Ambulatory Visit | Attending: Cardiovascular Disease | Admitting: Cardiovascular Disease

## 2016-12-10 ENCOUNTER — Telehealth: Payer: Self-pay | Admitting: Pulmonary Disease

## 2016-12-10 NOTE — Telephone Encounter (Signed)
Per Dr Elsworth Soho,  ONO on Room Air showed O2 saturations remaining low for most of the night. Patient needs to be remain on O2 with sleep.

## 2016-12-11 ENCOUNTER — Encounter (HOSPITAL_COMMUNITY)
Admission: RE | Admit: 2016-12-11 | Discharge: 2016-12-11 | Disposition: A | Discharge status: Home / Self Care | Payer: Self-pay | Source: Ambulatory Visit | Attending: Cardiovascular Disease | Admitting: Cardiovascular Disease

## 2016-12-11 NOTE — Telephone Encounter (Signed)
lmtcb x1 for pt. 

## 2016-12-15 NOTE — Telephone Encounter (Signed)
Called and spoke with pt and she is aware of results.  Nothing further is needed.  

## 2016-12-16 ENCOUNTER — Encounter (HOSPITAL_COMMUNITY): Payer: Self-pay

## 2016-12-17 DIAGNOSIS — M25649 Stiffness of unspecified hand, not elsewhere classified: Secondary | ICD-10-CM | POA: Diagnosis not present

## 2016-12-18 ENCOUNTER — Encounter (HOSPITAL_COMMUNITY): Payer: Self-pay

## 2016-12-23 ENCOUNTER — Encounter (HOSPITAL_COMMUNITY): Payer: Self-pay

## 2016-12-23 DIAGNOSIS — Z955 Presence of coronary angioplasty implant and graft: Secondary | ICD-10-CM | POA: Insufficient documentation

## 2016-12-23 DIAGNOSIS — I252 Old myocardial infarction: Secondary | ICD-10-CM | POA: Insufficient documentation

## 2016-12-24 DIAGNOSIS — M25649 Stiffness of unspecified hand, not elsewhere classified: Secondary | ICD-10-CM | POA: Diagnosis not present

## 2016-12-25 ENCOUNTER — Encounter (HOSPITAL_COMMUNITY)
Admission: RE | Admit: 2016-12-25 | Discharge: 2016-12-25 | Disposition: A | Discharge status: Home / Self Care | Payer: Self-pay | Source: Ambulatory Visit | Attending: Cardiovascular Disease | Admitting: Cardiovascular Disease

## 2016-12-28 ENCOUNTER — Encounter (HOSPITAL_COMMUNITY)
Admission: RE | Admit: 2016-12-28 | Discharge: 2016-12-28 | Disposition: A | Discharge status: Home / Self Care | Payer: Self-pay | Source: Ambulatory Visit | Attending: Cardiovascular Disease | Admitting: Cardiovascular Disease

## 2016-12-30 ENCOUNTER — Encounter (HOSPITAL_COMMUNITY)
Admission: RE | Admit: 2016-12-30 | Discharge: 2016-12-30 | Disposition: A | Discharge status: Home / Self Care | Payer: Self-pay | Source: Ambulatory Visit | Attending: Cardiovascular Disease | Admitting: Cardiovascular Disease

## 2016-12-30 DIAGNOSIS — M79644 Pain in right finger(s): Secondary | ICD-10-CM | POA: Diagnosis not present

## 2017-01-01 ENCOUNTER — Encounter (HOSPITAL_COMMUNITY): Payer: Self-pay

## 2017-01-04 ENCOUNTER — Encounter (HOSPITAL_COMMUNITY)
Admission: RE | Admit: 2017-01-04 | Discharge: 2017-01-04 | Disposition: A | Discharge status: Home / Self Care | Payer: Self-pay | Source: Ambulatory Visit | Attending: Cardiovascular Disease | Admitting: Cardiovascular Disease

## 2017-01-05 DIAGNOSIS — M25649 Stiffness of unspecified hand, not elsewhere classified: Secondary | ICD-10-CM | POA: Diagnosis not present

## 2017-01-06 ENCOUNTER — Encounter (HOSPITAL_COMMUNITY): Payer: Self-pay

## 2017-01-06 ENCOUNTER — Other Ambulatory Visit: Payer: Self-pay | Admitting: Internal Medicine

## 2017-01-06 NOTE — Telephone Encounter (Signed)
Refill x 6 months 

## 2017-01-08 ENCOUNTER — Encounter (HOSPITAL_COMMUNITY): Payer: Self-pay

## 2017-01-11 ENCOUNTER — Encounter (HOSPITAL_COMMUNITY)
Admission: RE | Admit: 2017-01-11 | Discharge: 2017-01-11 | Disposition: A | Discharge status: Home / Self Care | Payer: Self-pay | Source: Ambulatory Visit | Attending: Cardiovascular Disease | Admitting: Cardiovascular Disease

## 2017-01-12 DIAGNOSIS — M25649 Stiffness of unspecified hand, not elsewhere classified: Secondary | ICD-10-CM | POA: Diagnosis not present

## 2017-01-13 ENCOUNTER — Encounter (HOSPITAL_COMMUNITY)
Admission: RE | Admit: 2017-01-13 | Discharge: 2017-01-13 | Disposition: A | Discharge status: Home / Self Care | Payer: Self-pay | Source: Ambulatory Visit | Attending: Cardiovascular Disease | Admitting: Cardiovascular Disease

## 2017-01-14 DIAGNOSIS — M25649 Stiffness of unspecified hand, not elsewhere classified: Secondary | ICD-10-CM | POA: Diagnosis not present

## 2017-01-15 ENCOUNTER — Encounter (HOSPITAL_COMMUNITY): Payer: Self-pay

## 2017-01-18 ENCOUNTER — Encounter (HOSPITAL_COMMUNITY)
Admission: RE | Admit: 2017-01-18 | Discharge: 2017-01-18 | Disposition: A | Discharge status: Home / Self Care | Payer: Self-pay | Source: Ambulatory Visit | Attending: Cardiovascular Disease | Admitting: Cardiovascular Disease

## 2017-01-19 DIAGNOSIS — M25649 Stiffness of unspecified hand, not elsewhere classified: Secondary | ICD-10-CM | POA: Diagnosis not present

## 2017-01-20 ENCOUNTER — Encounter (HOSPITAL_COMMUNITY): Payer: Self-pay

## 2017-01-21 DIAGNOSIS — M25649 Stiffness of unspecified hand, not elsewhere classified: Secondary | ICD-10-CM | POA: Diagnosis not present

## 2017-01-22 ENCOUNTER — Encounter (HOSPITAL_COMMUNITY): Payer: Self-pay

## 2017-01-22 DIAGNOSIS — I252 Old myocardial infarction: Secondary | ICD-10-CM | POA: Insufficient documentation

## 2017-01-22 DIAGNOSIS — Z955 Presence of coronary angioplasty implant and graft: Secondary | ICD-10-CM | POA: Insufficient documentation

## 2017-01-25 ENCOUNTER — Encounter (HOSPITAL_COMMUNITY): Payer: Self-pay

## 2017-01-25 DIAGNOSIS — M25649 Stiffness of unspecified hand, not elsewhere classified: Secondary | ICD-10-CM | POA: Diagnosis not present

## 2017-01-27 ENCOUNTER — Encounter (HOSPITAL_COMMUNITY): Payer: Self-pay

## 2017-01-28 DIAGNOSIS — M25649 Stiffness of unspecified hand, not elsewhere classified: Secondary | ICD-10-CM | POA: Diagnosis not present

## 2017-01-29 ENCOUNTER — Encounter (HOSPITAL_COMMUNITY): Payer: Self-pay

## 2017-02-01 ENCOUNTER — Encounter (HOSPITAL_COMMUNITY): Payer: Self-pay

## 2017-02-02 DIAGNOSIS — M25649 Stiffness of unspecified hand, not elsewhere classified: Secondary | ICD-10-CM | POA: Diagnosis not present

## 2017-02-03 ENCOUNTER — Encounter (HOSPITAL_COMMUNITY)
Admission: RE | Admit: 2017-02-03 | Discharge: 2017-02-03 | Disposition: A | Discharge status: Home / Self Care | Payer: Self-pay | Source: Ambulatory Visit | Attending: Cardiovascular Disease | Admitting: Cardiovascular Disease

## 2017-02-04 DIAGNOSIS — M25649 Stiffness of unspecified hand, not elsewhere classified: Secondary | ICD-10-CM | POA: Diagnosis not present

## 2017-02-05 ENCOUNTER — Encounter (HOSPITAL_COMMUNITY)
Admission: RE | Admit: 2017-02-05 | Discharge: 2017-02-05 | Disposition: A | Discharge status: Home / Self Care | Payer: Self-pay | Source: Ambulatory Visit | Attending: Cardiovascular Disease | Admitting: Cardiovascular Disease

## 2017-02-08 ENCOUNTER — Encounter (HOSPITAL_COMMUNITY)
Admission: RE | Admit: 2017-02-08 | Discharge: 2017-02-08 | Disposition: A | Discharge status: Home / Self Care | Payer: Self-pay | Source: Ambulatory Visit | Attending: Cardiovascular Disease | Admitting: Cardiovascular Disease

## 2017-02-09 DIAGNOSIS — M25649 Stiffness of unspecified hand, not elsewhere classified: Secondary | ICD-10-CM | POA: Diagnosis not present

## 2017-02-10 ENCOUNTER — Encounter (HOSPITAL_COMMUNITY)
Admission: RE | Admit: 2017-02-10 | Discharge: 2017-02-10 | Disposition: A | Discharge status: Home / Self Care | Payer: Self-pay | Source: Ambulatory Visit | Attending: Cardiovascular Disease | Admitting: Cardiovascular Disease

## 2017-02-10 DIAGNOSIS — M79644 Pain in right finger(s): Secondary | ICD-10-CM | POA: Diagnosis not present

## 2017-02-10 DIAGNOSIS — M19041 Primary osteoarthritis, right hand: Secondary | ICD-10-CM | POA: Diagnosis not present

## 2017-02-12 ENCOUNTER — Encounter (HOSPITAL_COMMUNITY)
Admission: RE | Admit: 2017-02-12 | Discharge: 2017-02-12 | Disposition: A | Discharge status: Home / Self Care | Payer: Self-pay | Source: Ambulatory Visit | Attending: Cardiovascular Disease | Admitting: Cardiovascular Disease

## 2017-02-15 ENCOUNTER — Encounter (HOSPITAL_COMMUNITY)
Admission: RE | Admit: 2017-02-15 | Discharge: 2017-02-15 | Disposition: A | Discharge status: Home / Self Care | Payer: Self-pay | Source: Ambulatory Visit | Attending: Cardiovascular Disease | Admitting: Cardiovascular Disease

## 2017-02-16 DIAGNOSIS — M25649 Stiffness of unspecified hand, not elsewhere classified: Secondary | ICD-10-CM | POA: Diagnosis not present

## 2017-02-17 ENCOUNTER — Encounter (HOSPITAL_COMMUNITY)
Admission: RE | Admit: 2017-02-17 | Discharge: 2017-02-17 | Disposition: A | Discharge status: Home / Self Care | Payer: Self-pay | Source: Ambulatory Visit | Attending: Cardiovascular Disease | Admitting: Cardiovascular Disease

## 2017-02-18 DIAGNOSIS — M25649 Stiffness of unspecified hand, not elsewhere classified: Secondary | ICD-10-CM | POA: Diagnosis not present

## 2017-02-19 ENCOUNTER — Encounter (HOSPITAL_COMMUNITY)
Admission: RE | Admit: 2017-02-19 | Discharge: 2017-02-19 | Disposition: A | Discharge status: Home / Self Care | Payer: Self-pay | Source: Ambulatory Visit | Attending: Cardiovascular Disease | Admitting: Cardiovascular Disease

## 2017-02-19 DIAGNOSIS — I252 Old myocardial infarction: Secondary | ICD-10-CM | POA: Insufficient documentation

## 2017-02-19 DIAGNOSIS — Z955 Presence of coronary angioplasty implant and graft: Secondary | ICD-10-CM | POA: Insufficient documentation

## 2017-02-22 ENCOUNTER — Telehealth (HOSPITAL_COMMUNITY): Payer: Self-pay | Admitting: *Deleted

## 2017-02-22 ENCOUNTER — Encounter (HOSPITAL_COMMUNITY)
Admission: RE | Admit: 2017-02-22 | Discharge: 2017-02-22 | Disposition: A | Discharge status: Home / Self Care | Payer: Self-pay | Source: Ambulatory Visit | Attending: Cardiovascular Disease | Admitting: Cardiovascular Disease

## 2017-02-22 ENCOUNTER — Telehealth: Payer: Self-pay | Admitting: Cardiovascular Disease

## 2017-02-22 NOTE — Telephone Encounter (Signed)
I spoke with Joann and the pt checked her pulse at home on pulse ox and it was reading in the 40s-50s this morning.  The pt is asymptomatic and when she went to maintenance cardiac rehab she made Joann aware of this information.  Rhythm strip was performed and shows pulse in the 90s with frequent PACs per Joann.  No changes advised at this time and Arville Go will fax over rhythm strip for pt's upcoming appointment with Dr Burt Knack on 03/05/2017.

## 2017-02-22 NOTE — Telephone Encounter (Signed)
New message      Pt arrived for cardiac rehab and is having lots of PAC's.  Please advise

## 2017-02-22 NOTE — Progress Notes (Signed)
Pt arrived at cardiac rehab c/o bradycardia this am.   Pt reports home HR:  48 using pulse oximeter, HR-50-60 palpation.  Pt denies pain, dyspnea, fatigue or dizziness.  Pt also denies palpitations.  Rhythm strip-NSR with frequent PAC.  Rate-90.  BP:  144/80.  Palpable pulse rate -84 irregular.  PC to Dr. York Cerise office to advise. Per Ander Purpura, Dr. York Cerise nurse, no new orders.  Rhythm strip faxed for review. Pt will keep  previously scheduled OV 03/05/17, and will contact office sooner if symptoms persist or worsen.  Pt verbalized understanding.

## 2017-02-23 DIAGNOSIS — M25641 Stiffness of right hand, not elsewhere classified: Secondary | ICD-10-CM | POA: Diagnosis not present

## 2017-02-24 ENCOUNTER — Encounter (HOSPITAL_COMMUNITY)
Admission: RE | Admit: 2017-02-24 | Discharge: 2017-02-24 | Disposition: A | Discharge status: Home / Self Care | Payer: Self-pay | Source: Ambulatory Visit | Attending: Cardiovascular Disease | Admitting: Cardiovascular Disease

## 2017-02-25 DIAGNOSIS — M25649 Stiffness of unspecified hand, not elsewhere classified: Secondary | ICD-10-CM | POA: Diagnosis not present

## 2017-02-26 ENCOUNTER — Encounter: Payer: Self-pay | Admitting: Internal Medicine

## 2017-02-26 ENCOUNTER — Encounter (HOSPITAL_COMMUNITY): Payer: Self-pay

## 2017-02-26 ENCOUNTER — Ambulatory Visit (INDEPENDENT_AMBULATORY_CARE_PROVIDER_SITE_OTHER): Payer: Medicare Other | Admitting: Internal Medicine

## 2017-02-26 VITALS — BP 150/90 | HR 81 | Temp 98.3°F | Ht 61.0 in | Wt 160.0 lb

## 2017-02-26 DIAGNOSIS — M48062 Spinal stenosis, lumbar region with neurogenic claudication: Secondary | ICD-10-CM | POA: Diagnosis not present

## 2017-02-26 DIAGNOSIS — M5416 Radiculopathy, lumbar region: Secondary | ICD-10-CM

## 2017-02-26 MED ORDER — PREDNISONE 10 MG PO TABS: ORAL_TABLET | ORAL | 1 refills | Status: DC

## 2017-02-26 MED ORDER — PREDNISONE 20 MG PO TABS: ORAL_TABLET | ORAL | 1 refills | Status: DC

## 2017-02-26 NOTE — Progress Notes (Signed)
   Subjective:    Patient ID: Carrie Chung, female    DOB: Oct 29, 1941, 76 y.o.   MRN: [id number]  HPI In late September 2017 she was was treated for right-sided back pain radiating into right buttock. At that time pain had been present for several weeks and had gotten worse. She responded to 2 courses of prednisone.  Recently has had pain in her right lower back but doesn't recall any injury or strain.  History of MRI 2014 showing central herniation L1-L2 indenting the thecal sac slightly. L2-3 desiccation and bulging disc. L3-L4 desiccation and bulging of disc. Mild multifactorial stenosis.  L4-5 broad based disc herniation moving towards the right. Stenosis of both lateral recesses right worse than left. Neural compression likely at that level. L5-S1 desiccation and bulging of the disc.  At that time had positive straight leg raising on the right and negative on the left with normal deep tendon reflexes and muscle strength.  Pain now radiates from the right posterior superior iliac spine around to her right groin and down her right inner leg. The right thigh has some apparent weakness she says. See above    Review of Systems as above     Objective:   Physical Exam Straight leg raising is negative at 90 bilaterally. Muscle strength appears to be normal in the right lower extremity. Decreased range of motion in [place] due to back pain. She gets up slowly from the chair.       Assessment & Plan:  Right back pain with lumbar radiculopathy. Seems to be L4-L5 on the right and possibly l3-4  Plan: Sterapred DS 10 mg 6 day dosepak with 1 refill.

## 2017-02-26 NOTE — Patient Instructions (Signed)
Take tapering course of prednisone as directed over 6 days. If pain not better repeat the dosepak.

## 2017-03-01 ENCOUNTER — Encounter (HOSPITAL_COMMUNITY): Payer: Self-pay

## 2017-03-02 DIAGNOSIS — M25641 Stiffness of right hand, not elsewhere classified: Secondary | ICD-10-CM | POA: Diagnosis not present

## 2017-03-03 ENCOUNTER — Encounter (HOSPITAL_COMMUNITY): Payer: Self-pay

## 2017-03-04 DIAGNOSIS — M25641 Stiffness of right hand, not elsewhere classified: Secondary | ICD-10-CM | POA: Diagnosis not present

## 2017-03-05 ENCOUNTER — Ambulatory Visit (INDEPENDENT_AMBULATORY_CARE_PROVIDER_SITE_OTHER): Payer: Medicare Other | Admitting: Cardiovascular Disease

## 2017-03-05 ENCOUNTER — Encounter (HOSPITAL_COMMUNITY): Payer: Self-pay

## 2017-03-05 ENCOUNTER — Encounter: Payer: Self-pay | Admitting: Cardiovascular Disease

## 2017-03-05 VITALS — BP 140/86 | HR 76 | Ht 61.5 in | Wt 158.2 lb

## 2017-03-05 DIAGNOSIS — R002 Palpitations: Secondary | ICD-10-CM | POA: Diagnosis not present

## 2017-03-05 DIAGNOSIS — I209 Angina pectoris, unspecified: Secondary | ICD-10-CM | POA: Diagnosis not present

## 2017-03-05 DIAGNOSIS — E782 Mixed hyperlipidemia: Secondary | ICD-10-CM

## 2017-03-05 DIAGNOSIS — I25119 Atherosclerotic heart disease of native coronary artery with unspecified angina pectoris: Secondary | ICD-10-CM

## 2017-03-05 MED ORDER — METOPROLOL TARTRATE 50 MG PO TABS: 75.0000 mg | ORAL_TABLET | Freq: Two times a day (BID) | ORAL | 3 refills | Status: DC

## 2017-03-05 NOTE — Progress Notes (Signed)
Cardiology Office Note Date:  03/05/2017   ID:  Reizy, Dunlow [birth year], MRN [id number]  PCP:  [name], MD  Cardiologist:  [name], MD    Chief Complaint  Patient presents with  . Follow-up     History of Present Illness: Carrie Chung is a 76 y.o. female who presents for follow-up of coronary artery disease. She underwent multivessel stenting in April 2013 after presenting with a non-ST elevation MI. She was treated with stenting of the right coronary artery initially followed by staged PCI of the LAD and left circumflex. She was treated with drug-eluting stents. Her left ventricular function was normal with an EF of 60%. She had a stress Myoview scan in December 2013 and this showed normal perfusion and normal LV function. She again presented with chest pain in May 2015 and was evaluated with observation in the hospital. She ruled out for myocardial infarction and underwent a stress echocardiogram demonstrating no ischemic changes.  The patient is here alone today. She complains of shortness of breath with activity. This is her primary complaint and is been an issue now for some time. She is not as active anymore because of back problems. She has not had any recent chest discomfort. She had an episode at cardiac rehabilitation where she was noted to be in atrial bigeminy. Earlier in the day she had had a slow pulse rate on her blood pressure machine. She had fatigue with this and thinks the episode lasted about 5 or 6 hours. She denies lightheadedness or syncope. She had another episode of tachycardia-palpitations that was fairly brief in nature when her heart rate was 120 bpm.   Past Medical History:  Diagnosis Date  . Atrial tachycardia (HCC)    a. noted at cardiac rehab 5/13; event monitor ordered to assess for AFib  . CAD (coronary artery disease)    a. NSTEMI 03/2012 ([place] [facility] 03/27/12: pLAD 30%, mLAD 99%, mCFX 95%, mRCA 99% with thrombus, EF 65%);  s/p  PTCA/DESx1 to prox-mid RCA 03/27/12 urgently in setting of hypotension/bradycardia, with staged PTCA/Evolve study stent to mid LAD & PTCA/Evolve study stent to prox LCx 03/29/12 ;   echo 03/28/12:EF 60%, Aortic sclerosis without AS, mild RVE, mild reduced RVSF    . Endometrial polyp   . HTN (hypertension)   . Hyperglycemia   . Hyperlipidemia   . Hypokalemia   . MI, old   . Sleep apnea     Past Surgical History:  Procedure Laterality Date  . APPENDECTOMY  76 years old  . CAROTID STENT  04/2012   x3  . CESAREAN SECTION  1984  . CHOLECYSTECTOMY  1978  . LEFT HEART CATHETERIZATION WITH CORONARY ANGIOGRAM N/A 03/27/2012   Procedure: LEFT HEART CATHETERIZATION WITH CORONARY ANGIOGRAM;  Surgeon: Burnell Blanks, MD;  Location: [facility] CATH LAB;  Service: Cardiovascular;  Laterality: N/A;  . PERCUTANEOUS CORONARY STENT INTERVENTION (PCI-S) N/A 03/27/2012   Procedure: PERCUTANEOUS CORONARY STENT INTERVENTION (PCI-S);  Surgeon: Burnell Blanks, MD;  Location: [facility] CATH LAB;  Service: Cardiovascular;  Laterality: N/A;  . PERCUTANEOUS CORONARY STENT INTERVENTION (PCI-S) N/A 03/29/2012   Procedure: PERCUTANEOUS CORONARY STENT INTERVENTION (PCI-S);  Surgeon: [name], MD;  Location: [facility] CATH LAB;  Service: Cardiovascular;  Laterality: N/A;  . TONSILLECTOMY  76 years old    Current Outpatient Prescriptions  Medication Sig Dispense Refill  . ALPRAZolam (XANAX) 1 MG tablet TAKE 1 TABLET BY MOUTH AT BEDTIME AS NEEDED FOR SLEEP 30 tablet 5  .  amLODipine (NORVASC) 5 MG tablet TAKE 1 TABLET BY MOUTH DAILY 90 tablet 1  . aspirin 81 MG tablet Take 1 tablet (81 mg total) by mouth daily.    . Calcium Carbonate-Vitamin D (CALTRATE 600+D) 600-400 MG-UNIT per tablet Take 1 tablet by mouth 2 (two) times daily.     . clopidogrel (PLAVIX) 75 MG tablet TAKE 1 TABLET (75 MG TOTAL) BY MOUTH DAILY. 90 tablet 2  . CRESTOR 10 MG tablet TAKE 1 TABLET (10 MG TOTAL) BY MOUTH DAILY. 90 tablet 2  . desvenlafaxine (PRISTIQ)  50 MG 24 hr tablet Take 50 mg by mouth daily.    Marland Kitchen esomeprazole (NEXIUM) 40 MG capsule Take 40 mg by mouth daily at 12 noon.    . FOLBIC 2.5-25-2 MG TABS tablet TAKE 1 TABLET BY MOUTH DAILY. 90 tablet 3  . hydrochlorothiazide (HYDRODIURIL) 25 MG tablet Take 1 tablet (25 mg total) by mouth daily. 90 tablet 3  . HYDROcodone-acetaminophen (NORCO) 10-325 MG tablet Take 1 tablet by mouth every 8 (eight) hours as needed for severe pain. 90 tablet 0  . metoprolol (LOPRESSOR) 50 MG tablet Take 50 mg by mouth 2 (two) times daily.    Marland Kitchen NITROSTAT 0.4 MG SL tablet PLACE 1 TAB UNDER TONGUE EVERY 5 MINUTES FOR UP TO 3 DOSES AS NEEDED FOR CHEST PAIN 25 tablet 1  . NON FORMULARY Oxygen 2L at night    . predniSONE (STERAPRED UNI-PAK 21 TAB) 10 MG (21) TBPK tablet Take 10 mg by mouth as directed.  1  . VENTOLIN HFA 108 (90 Base) MCG/ACT inhaler INHALE 2 PUFFS INTO THE LUNGS EVERY 6 (SIX) HOURS AS NEEDED FOR WHEEZING OR SHORTNESS OF BREATH. 18 Inhaler 0   No current facility-administered medications for this visit.     Allergies:   Penicillins and Sulfonamide derivatives   Social History:  The patient  reports that she quit smoking about 36 years ago. Her smoking use included Cigarettes. She has never used smokeless tobacco. She reports that she drinks about 12.0 oz of alcohol per week . She reports that she does not use drugs.   Family History:  The patient's  family history includes Cancer - Other in her father; Colon cancer in her father; Coronary artery disease in her mother.    ROS:  Please see the history of present illness.  Otherwise, review of systems is positive for Back pain, wheezing, headaches.  All other systems are reviewed and negative.   PHYSICAL EXAM: VS:  BP 140/86   Pulse 76   Ht 5' 1.5" (1.562 m)   Wt 158 lb 3.2 oz (71.8 kg)   BMI 29.41 kg/m  , BMI Body mass index is 29.41 kg/m. GEN: Well nourished, well developed, in no acute distress  HEENT: normal  Neck: no JVD, no masses. No  carotid bruits Cardiac: RRR without murmur or gallop                Respiratory:  clear to auscultation bilaterally, normal work of breathing GI: soft, nontender, nondistended, + BS MS: no deformity or atrophy  Ext: no pretibial edema, pedal pulses 2+= bilaterally Skin: warm and dry, no rash Neuro:  Strength and sensation are intact Psych: euthymic mood, full affect  EKG:  EKG is ordered today. The ekg ordered today shows normal sinus rhythm 76 bpm, within normal limits.  Recent Labs: 06/29/2016: ALT 12; BUN 8; Creat 0.73; Hemoglobin 13.3; Platelets 149; Potassium 3.7; Sodium 142; TSH 1.59   Lipid Panel  Component Value Date/Time   CHOL 159 06/29/2016 1313   TRIG 161 (H) 06/29/2016 1313   HDL 76 06/29/2016 1313   CHOLHDL 2.1 06/29/2016 1313   VLDL 32 (H) 06/29/2016 1313   LDLCALC 51 06/29/2016 1313   LDLDIRECT 152.4 03/09/2012 0900      Wt Readings from Last 3 Encounters:  03/05/17 158 lb 3.2 oz (71.8 kg)  02/26/17 160 lb (72.6 kg)  10/23/16 161 lb 3.2 oz (73.1 kg)     Cardiac Studies Reviewed: Echo 06-19-2015: Left ventricle:  The cavity size was normal. Wall thickness was normal. Systolic function was normal. The estimated ejection fraction was in the range of 60% to 65%. Wall motion was normal; there were no regional wall motion abnormalities. Doppler parameters are consistent with abnormal left ventricular relaxation (grade 1 diastolic dysfunction). Indeterminate mean left atrial pressure.  ------------------------------------------------------------------- Aortic valve:   Structurally normal valve. Trileaflet; normal thickness leaflets. Cusp separation was normal. Mobility was not restricted. Sclerosis without stenosis.  Doppler:  Transvalvular velocity was within the normal range. There was no stenosis. There was no regurgitation.  ------------------------------------------------------------------- Aorta:  Aortic root: The aortic root was normal in  size. Ascending aorta: The ascending aorta was normal in size.  ------------------------------------------------------------------- Mitral valve:   Calcified annulus. Normal thickness leaflets . Mobility was not restricted.  Doppler:  Transvalvular velocity was within the normal range. There was no evidence for stenosis. There was no regurgitation.    Peak gradient (D): 3 mm Hg.  ------------------------------------------------------------------- Left atrium:  The atrium was at the upper limits of normal in size.   ------------------------------------------------------------------- Atrial septum:  No defect or patent foramen ovale was identified. Echo contrast study showed no right-to-left atrial level shunt, at baseline or with provocation.  ------------------------------------------------------------------- Right ventricle:  The cavity size was normal. Wall thickness was normal. Systolic function was normal.  ------------------------------------------------------------------- Pulmonic valve:   Poorly visualized.  Doppler:  Transvalvular velocity was within the normal range. There was no evidence for stenosis. There was no significant regurgitation.  ------------------------------------------------------------------- Tricuspid valve:   Structurally normal valve.   Leaflet separation was normal.  Doppler:  Transvalvular velocity was within the normal range. There was no regurgitation.  ------------------------------------------------------------------- Pulmonary artery:   The main pulmonary artery was normal-sized. Systolic pressure could not be accurately estimated.  ------------------------------------------------------------------- Right atrium:  The atrium was normal in size.  ------------------------------------------------------------------- Pericardium:  There was no pericardial  effusion.  ------------------------------------------------------------------- Systemic veins: Inferior vena cava: The vessel was normal in size. The respirophasic diameter changes were in the normal range (>= 50%), consistent with normal central venous pressure.   ASSESSMENT AND PLAN: 1.  CAD, native vessel, with angina: The patient is stable from a cardiac perspective. Her medical program will be continued. Her antianginal regimen includes amlodipine, metoprolol, and dual antiplatelet therapy with aspirin and clopidogrel.  2. Hyperlipidemia: Treated with Crestor 10 mg daily. Most recent lipids reviewed.  3. Hypertension: Will increase metoprolol to 75 mg twice a day. Continue hydrochlorothiazide and amlodipine at current doses.  4. Tachypalpitations: Will increase metoprolol. I reviewed her telemetry strips from cardiac rehabilitation which demonstrated atrial bigeminy. Patient is in sinus rhythm today. She has undergone outpatient telemetry monitoring in the past and will defer this for now unless her symptoms worsen.  Current medicines are reviewed with the patient today.  The patient does not have concerns regarding medicines.  Labs/ tests ordered today include:  No orders of the defined types were placed in this encounter.   Disposition:  FU 6 months  Signed, [name], MD  03/05/2017 12:37 PM    [address], [place], [place]  [address] Phone: [phone]; Fax: [phone]

## 2017-03-05 NOTE — Patient Instructions (Signed)
Medication Instructions:  Your physician has recommended you make the following change in your medication:  1. INCREASE Metoprolol Tartrate to 50mg  take one and one-half tablet by mouth twice a day  Labwork: No new orders.   Testing/Procedures: No new orders.   Follow-Up: Your physician wants you to follow-up in: 6 MONTHS with Dr Burt Knack.  You will receive a reminder letter in the mail two months in advance. If you don't receive a letter, please call our office to schedule the follow-up appointment.   Any Other Special Instructions Will Be Listed Below (If Applicable).     If you need a refill on your cardiac medications before your next appointment, please call your pharmacy.

## 2017-03-08 ENCOUNTER — Encounter (HOSPITAL_COMMUNITY)
Admission: RE | Admit: 2017-03-08 | Discharge: 2017-03-08 | Disposition: A | Discharge status: Home / Self Care | Payer: Self-pay | Source: Ambulatory Visit | Attending: Cardiovascular Disease | Admitting: Cardiovascular Disease

## 2017-03-09 DIAGNOSIS — M25649 Stiffness of unspecified hand, not elsewhere classified: Secondary | ICD-10-CM | POA: Diagnosis not present

## 2017-03-10 ENCOUNTER — Encounter (HOSPITAL_COMMUNITY): Payer: Self-pay

## 2017-03-12 ENCOUNTER — Encounter (HOSPITAL_COMMUNITY)
Admission: RE | Admit: 2017-03-12 | Discharge: 2017-03-12 | Disposition: A | Discharge status: Home / Self Care | Payer: Self-pay | Source: Ambulatory Visit | Attending: Cardiovascular Disease | Admitting: Cardiovascular Disease

## 2017-03-15 ENCOUNTER — Encounter (HOSPITAL_COMMUNITY)
Admission: RE | Admit: 2017-03-15 | Discharge: 2017-03-15 | Disposition: A | Discharge status: Home / Self Care | Payer: Self-pay | Source: Ambulatory Visit | Attending: Cardiovascular Disease | Admitting: Cardiovascular Disease

## 2017-03-17 ENCOUNTER — Encounter (HOSPITAL_COMMUNITY)
Admission: RE | Admit: 2017-03-17 | Discharge: 2017-03-17 | Disposition: A | Discharge status: Home / Self Care | Payer: Self-pay | Source: Ambulatory Visit | Attending: Cardiovascular Disease | Admitting: Cardiovascular Disease

## 2017-03-19 ENCOUNTER — Encounter (HOSPITAL_COMMUNITY)
Admission: RE | Admit: 2017-03-19 | Discharge: 2017-03-19 | Disposition: A | Discharge status: Home / Self Care | Payer: Self-pay | Source: Ambulatory Visit | Attending: Cardiovascular Disease | Admitting: Cardiovascular Disease

## 2017-03-22 ENCOUNTER — Encounter (HOSPITAL_COMMUNITY): Payer: Medicare Other

## 2017-03-22 DIAGNOSIS — I252 Old myocardial infarction: Secondary | ICD-10-CM | POA: Insufficient documentation

## 2017-03-22 DIAGNOSIS — Z955 Presence of coronary angioplasty implant and graft: Secondary | ICD-10-CM | POA: Insufficient documentation

## 2017-03-24 ENCOUNTER — Encounter (HOSPITAL_COMMUNITY): Payer: Medicare Other

## 2017-03-24 DIAGNOSIS — M19041 Primary osteoarthritis, right hand: Secondary | ICD-10-CM | POA: Diagnosis not present

## 2017-03-25 ENCOUNTER — Other Ambulatory Visit: Payer: Medicare Other | Admitting: Internal Medicine

## 2017-03-25 DIAGNOSIS — Z1329 Encounter for screening for other suspected endocrine disorder: Secondary | ICD-10-CM | POA: Diagnosis not present

## 2017-03-25 DIAGNOSIS — I10 Essential (primary) hypertension: Secondary | ICD-10-CM | POA: Diagnosis not present

## 2017-03-25 DIAGNOSIS — E2839 Other primary ovarian failure: Secondary | ICD-10-CM | POA: Diagnosis not present

## 2017-03-25 DIAGNOSIS — M8580 Other specified disorders of bone density and structure, unspecified site: Secondary | ICD-10-CM | POA: Diagnosis not present

## 2017-03-25 DIAGNOSIS — Z0000 Encounter for general adult medical examination without abnormal findings: Secondary | ICD-10-CM | POA: Diagnosis not present

## 2017-03-25 DIAGNOSIS — Z1321 Encounter for screening for nutritional disorder: Secondary | ICD-10-CM

## 2017-03-25 DIAGNOSIS — E785 Hyperlipidemia, unspecified: Secondary | ICD-10-CM | POA: Diagnosis not present

## 2017-03-25 DIAGNOSIS — R7302 Impaired glucose tolerance (oral): Secondary | ICD-10-CM | POA: Diagnosis not present

## 2017-03-25 LAB — CBC WITH DIFFERENTIAL/PLATELET
BASOS ABS: 0 {cells}/uL (ref 0–200)
Basophils Relative: 0 %
EOS PCT: 1 %
Eosinophils Absolute: 37 cells/uL (ref 15–500)
HCT: 41.3 % (ref 35.0–45.0)
HEMOGLOBIN: 13.9 g/dL (ref 11.7–15.5)
LYMPHS ABS: 1184 {cells}/uL (ref 850–3900)
Lymphocytes Relative: 32 %
MCH: 30.7 pg (ref 27.0–33.0)
MCHC: 33.7 g/dL (ref 32.0–36.0)
MCV: 91.2 fL (ref 80.0–100.0)
MONOS PCT: 8 %
MPV: 9.9 fL (ref 7.5–12.5)
Monocytes Absolute: 296 cells/uL (ref 200–950)
NEUTROS PCT: 59 %
Neutro Abs: 2183 cells/uL (ref 1500–7800)
Platelets: 130 10*3/uL — ABNORMAL LOW (ref 140–400)
RBC: 4.53 MIL/uL (ref 3.80–5.10)
RDW: 15 % (ref 11.0–15.0)
WBC: 3.7 10*3/uL — AB (ref 3.8–10.8)

## 2017-03-25 LAB — COMPREHENSIVE METABOLIC PANEL
ALT: 15 U/L (ref 6–29)
AST: 21 U/L (ref 10–35)
Albumin: 4 g/dL (ref 3.6–5.1)
Alkaline Phosphatase: 69 U/L (ref 33–130)
BILIRUBIN TOTAL: 0.4 mg/dL (ref 0.2–1.2)
BUN: 9 mg/dL (ref 7–25)
CHLORIDE: 98 mmol/L (ref 98–110)
CO2: 31 mmol/L (ref 20–31)
CREATININE: 0.63 mg/dL (ref 0.60–0.93)
Calcium: 9.3 mg/dL (ref 8.6–10.4)
GLUCOSE: 127 mg/dL — AB (ref 65–99)
Potassium: 3.8 mmol/L (ref 3.5–5.3)
SODIUM: 140 mmol/L (ref 135–146)
Total Protein: 6.4 g/dL (ref 6.1–8.1)

## 2017-03-25 LAB — LIPID PANEL
CHOL/HDL RATIO: 2.1 ratio (ref <5.0)
Cholesterol: 164 mg/dL (ref <200)
HDL: 79 mg/dL (ref >50)
LDL Cholesterol: 44 mg/dL (ref <100)
Triglycerides: 205 mg/dL — ABNORMAL HIGH (ref <150)
VLDL: 41 mg/dL — AB (ref <30)

## 2017-03-25 LAB — TSH: TSH: 2.03 m[IU]/L

## 2017-03-26 ENCOUNTER — Encounter (HOSPITAL_COMMUNITY): Payer: Medicare Other

## 2017-03-26 LAB — HEMOGLOBIN A1C
Hgb A1c MFr Bld: 6 % — ABNORMAL HIGH (ref <5.7)
Mean Plasma Glucose: 126 mg/dL

## 2017-03-26 LAB — MICROALBUMIN / CREATININE URINE RATIO
Creatinine, Urine: 88 mg/dL (ref 20–320)
Microalb Creat Ratio: 17 mcg/mg creat (ref <30)
Microalb, Ur: 1.5 mg/dL

## 2017-03-26 LAB — VITAMIN D 25 HYDROXY (VIT D DEFICIENCY, FRACTURES): VIT D 25 HYDROXY: 44 ng/mL (ref 30–100)

## 2017-03-29 ENCOUNTER — Encounter (HOSPITAL_COMMUNITY)
Admission: RE | Admit: 2017-03-29 | Discharge: 2017-03-29 | Disposition: A | Discharge status: Home / Self Care | Payer: Self-pay | Source: Ambulatory Visit | Attending: Cardiovascular Disease | Admitting: Cardiovascular Disease

## 2017-03-30 ENCOUNTER — Ambulatory Visit (INDEPENDENT_AMBULATORY_CARE_PROVIDER_SITE_OTHER): Payer: Medicare Other | Admitting: Internal Medicine

## 2017-03-30 ENCOUNTER — Encounter: Payer: Self-pay | Admitting: Internal Medicine

## 2017-03-30 VITALS — BP 110/78 | HR 75 | Temp 98.0°F | Ht 60.0 in | Wt 161.0 lb

## 2017-03-30 DIAGNOSIS — Z0000 Encounter for general adult medical examination without abnormal findings: Secondary | ICD-10-CM

## 2017-03-30 DIAGNOSIS — M5416 Radiculopathy, lumbar region: Secondary | ICD-10-CM

## 2017-03-30 DIAGNOSIS — Z8679 Personal history of other diseases of the circulatory system: Secondary | ICD-10-CM

## 2017-03-30 DIAGNOSIS — F32A Depression, unspecified: Secondary | ICD-10-CM

## 2017-03-30 DIAGNOSIS — G4709 Other insomnia: Secondary | ICD-10-CM

## 2017-03-30 DIAGNOSIS — E7849 Other hyperlipidemia: Secondary | ICD-10-CM

## 2017-03-30 DIAGNOSIS — E784 Other hyperlipidemia: Secondary | ICD-10-CM

## 2017-03-30 DIAGNOSIS — F419 Anxiety disorder, unspecified: Secondary | ICD-10-CM

## 2017-03-30 DIAGNOSIS — I252 Old myocardial infarction: Secondary | ICD-10-CM | POA: Diagnosis not present

## 2017-03-30 DIAGNOSIS — Z955 Presence of coronary angioplasty implant and graft: Secondary | ICD-10-CM | POA: Diagnosis not present

## 2017-03-30 DIAGNOSIS — M48061 Spinal stenosis, lumbar region without neurogenic claudication: Secondary | ICD-10-CM

## 2017-03-30 DIAGNOSIS — G4733 Obstructive sleep apnea (adult) (pediatric): Secondary | ICD-10-CM | POA: Diagnosis not present

## 2017-03-30 DIAGNOSIS — I10 Essential (primary) hypertension: Secondary | ICD-10-CM | POA: Diagnosis not present

## 2017-03-30 DIAGNOSIS — K219 Gastro-esophageal reflux disease without esophagitis: Secondary | ICD-10-CM | POA: Diagnosis not present

## 2017-03-30 DIAGNOSIS — R7302 Impaired glucose tolerance (oral): Secondary | ICD-10-CM | POA: Diagnosis not present

## 2017-03-30 DIAGNOSIS — F329 Major depressive disorder, single episode, unspecified: Secondary | ICD-10-CM

## 2017-03-30 LAB — POCT URINALYSIS DIPSTICK
Bilirubin, UA: NEGATIVE
Glucose, UA: NEGATIVE
Ketones, UA: NEGATIVE
LEUKOCYTES UA: NEGATIVE
NITRITE UA: NEGATIVE
PH UA: 6 (ref 5.0–8.0)
PROTEIN UA: NEGATIVE
RBC UA: NEGATIVE
Spec Grav, UA: 1.015 (ref 1.030–1.035)
UROBILINOGEN UA: NEGATIVE (ref ?–2.0)

## 2017-03-30 NOTE — Progress Notes (Signed)
Subjective:    Patient ID: Carrie Chung, female    DOB: 1941/02/20, 76 y.o.   MRN: [id number]  HPI 76 year old Female for Medicare Wellness exam, routine health maintenance, and evaluation of medical issues.  Goes to cardiac rehab 3 times a week.  Saw Dr. Fredna Dow for trigger finger and had therapy for a number of weeks. Is better.   She has a history of essential hypertension and coronary artery disease.She has a history of anxiety depression, hyperlipidemia, insomnia and lumbar back pain. History of impaired glucose tolerance and history of obstructive sleep apnea.  She had a non-ST elevation MI in April 2013. While being hospitalized, she had bradycardia and ST elevation was noted. She was taken to the Cath Lab on an urgent basis and was found to have a 99% blockage in the right [facility] artery with thrombus. A drug-eluting stent was placed. 2 days later she was taken back to the cath lab because of too high grade blockages 1 in the LAD and one in the left circumflex vessel also requiring PTCA and drug-eluting stent. She's done remarkably well and is stable.  Cardiac situation was anxiety provoking and that caused anxiety stress and insomnia. She does have a long-standing history of insomnia.   She has a history of sleep apnea. She is followed by Dr. Brett Fairy for sleep apnea and cognitive issues.  History of GE reflux.  Had brucellosis in 1961.   Had colonoscopy by Dr. Deatra Ina 2005  Had tetanus immunization 2013.  She is allergic to penicillin, tetracycline and has had adverse reactions to Rozerem, Ambien, and trazodone  Past medical history includes tonsillectomy and adenoidectomy, appendectomy, removal of Morton's neuroma. Had herniated disc L3, L4, L5 in 1994. From time to time gets low back pain with radiculopathy. She's had tear of the right supraspinatus muscle September 2003. C-section 1984. Cholecystectomy 1979. Right foot sesamoid fracture at age 67. Right knee meniscal  tear 1975. Fractured pelvis 2001.  Family history: Her mother died at age 34 with history of cardiovascular disease. Father died at age 40 of cancer of the colon. 2 sisters in good health. One daughter who is an attorney in good health. One grandson.  Social history: She is married to a radiation oncology physician who works in [place]. She quit smoking over 20 years ago. Social alcohol consumption.  In March had a bout of right-sided back pain radiating into right buttock. MRI 2014 of the LS spine showed central herniation L1-L2 indenting the thecal sac slightly. L2-L3 desiccation and bulging disc. L3-L4 desiccation and bulging disc. She was treated with prednisone in March and improved.  Review of Systems  Constitutional: Positive for fatigue.  Respiratory:       History of sleep apnea uses C Pap  Cardiovascular: Negative.   Gastrointestinal:       History of GE reflux  Musculoskeletal: Positive for back pain.  Neurological:       Feels that there are some issues with her memory  Psychiatric/Behavioral:       Anxiety depression and insomnia       Objective:   Physical Exam  Constitutional: She appears well-developed and well-nourished. No distress.  HENT:  Head: Normocephalic and atraumatic.  Right Ear: External ear normal.  Left Ear: External ear normal.  Mouth/Throat: Oropharynx is clear and moist. No oropharyngeal exudate.  Eyes: Conjunctivae and EOM are normal. Pupils are equal, round, and reactive to light. Right eye exhibits no discharge. Left eye exhibits no discharge.  No scleral icterus.  Neck: Neck supple. No JVD present. No thyromegaly present.  Cardiovascular: Normal rate, regular rhythm, normal heart sounds and intact distal pulses.   No murmur heard. Pulmonary/Chest: Effort normal and breath sounds normal. No respiratory distress. She has no wheezes. She has no rales.  Breasts normal female  Abdominal: Soft. Bowel sounds are normal. She exhibits no distension  and no mass. There is no tenderness. There is no rebound and no guarding.  Genitourinary:  Genitourinary Comments: Deferred to [facility]  Musculoskeletal: She exhibits no edema.  Lymphadenopathy:    She has no cervical adenopathy.  Neurological: She has normal reflexes. Coordination normal.  Skin: Skin is warm and dry. No rash noted. She is not diaphoretic.  Psychiatric: She has a normal mood and affect. Her behavior is normal. Thought content normal.  Vitals reviewed.         Assessment & Plan:  Normal health maintenance exam  Coronary artery disease-followed by cardiology  History of lumbar back pain with radiculopathy  History of sleep apnea-treated with C Pap  Anxiety depression-treated with Pristiq  Insomnia-treated with Xanax 1 mg at bedtime  Essential hypertension-stable  Hyperlipidemia-triglycerides elevated at 205-treated with Crestor  GE reflux-treated with Nexium  Obesity  Mild cognitive impairment  Essential hypertension-treated with Norvasc, HCTZ, Lopressor  Impaired glucose tolerance-hemoglobin A1c 6%  Plan: Continue to see Cardiologist on a regular basis here continue follow-up with Dr. Brett Fairy, neurologist. Continue C Pap. Encouraged diet exercise and weight loss. Watch alcohol consumption. Return in 6 months or as needed.

## 2017-03-31 ENCOUNTER — Encounter (HOSPITAL_COMMUNITY)
Admission: RE | Admit: 2017-03-31 | Discharge: 2017-03-31 | Disposition: A | Discharge status: Home / Self Care | Payer: Self-pay | Source: Ambulatory Visit | Attending: Cardiovascular Disease | Admitting: Cardiovascular Disease

## 2017-04-01 ENCOUNTER — Encounter: Payer: Self-pay | Admitting: *Deleted

## 2017-04-01 DIAGNOSIS — Z006 Encounter for examination for normal comparison and control in clinical research program: Secondary | ICD-10-CM

## 2017-04-01 NOTE — Progress Notes (Signed)
Late entry: Spoke with patient for final EOS visit in the Evolve II research study on 03/08/2017. Thanked patient for participating in study.  Jake Bathe, RN

## 2017-04-02 ENCOUNTER — Encounter (HOSPITAL_COMMUNITY): Payer: Medicare Other

## 2017-04-03 ENCOUNTER — Other Ambulatory Visit: Payer: Self-pay | Admitting: Cardiovascular Disease

## 2017-04-05 ENCOUNTER — Encounter (HOSPITAL_COMMUNITY)
Admission: RE | Admit: 2017-04-05 | Discharge: 2017-04-05 | Disposition: A | Discharge status: Home / Self Care | Payer: Self-pay | Source: Ambulatory Visit | Attending: Cardiovascular Disease | Admitting: Cardiovascular Disease

## 2017-04-07 ENCOUNTER — Encounter (HOSPITAL_COMMUNITY)
Admission: RE | Admit: 2017-04-07 | Discharge: 2017-04-07 | Disposition: A | Discharge status: Home / Self Care | Payer: Self-pay | Source: Ambulatory Visit | Attending: Cardiovascular Disease | Admitting: Cardiovascular Disease

## 2017-04-09 ENCOUNTER — Encounter (HOSPITAL_COMMUNITY): Payer: Self-pay

## 2017-04-12 ENCOUNTER — Encounter (HOSPITAL_COMMUNITY): Payer: Self-pay

## 2017-04-14 ENCOUNTER — Encounter (HOSPITAL_COMMUNITY)
Admission: RE | Admit: 2017-04-14 | Discharge: 2017-04-14 | Disposition: A | Discharge status: Home / Self Care | Payer: Self-pay | Source: Ambulatory Visit | Attending: Cardiovascular Disease | Admitting: Cardiovascular Disease

## 2017-04-16 ENCOUNTER — Encounter (HOSPITAL_COMMUNITY): Payer: Self-pay

## 2017-04-19 ENCOUNTER — Encounter (HOSPITAL_COMMUNITY): Payer: Self-pay

## 2017-04-19 NOTE — Patient Instructions (Signed)
It was pleasure to see you today. Continue to work on diet exercise and weight loss and return in 6 months. Continue same medications.

## 2017-04-21 ENCOUNTER — Encounter (HOSPITAL_COMMUNITY): Payer: Medicare Other

## 2017-04-21 DIAGNOSIS — Z955 Presence of coronary angioplasty implant and graft: Secondary | ICD-10-CM | POA: Insufficient documentation

## 2017-04-21 DIAGNOSIS — I252 Old myocardial infarction: Secondary | ICD-10-CM | POA: Insufficient documentation

## 2017-04-23 ENCOUNTER — Encounter (HOSPITAL_COMMUNITY): Payer: Medicare Other

## 2017-04-26 ENCOUNTER — Encounter (HOSPITAL_COMMUNITY): Payer: Self-pay

## 2017-04-28 ENCOUNTER — Encounter (HOSPITAL_COMMUNITY): Payer: Self-pay

## 2017-04-28 ENCOUNTER — Encounter: Payer: Self-pay | Admitting: *Deleted

## 2017-04-28 DIAGNOSIS — Z006 Encounter for examination for normal comparison and control in clinical research program: Secondary | ICD-10-CM

## 2017-04-28 NOTE — Progress Notes (Signed)
Late entry: Subject ended follow up in the [place] on March 08, 2017.

## 2017-04-30 ENCOUNTER — Encounter (HOSPITAL_COMMUNITY): Payer: Self-pay

## 2017-05-03 ENCOUNTER — Encounter (HOSPITAL_COMMUNITY): Payer: Self-pay

## 2017-05-05 ENCOUNTER — Encounter (HOSPITAL_COMMUNITY): Payer: Self-pay

## 2017-05-05 DIAGNOSIS — L2489 Irritant contact dermatitis due to other agents: Secondary | ICD-10-CM | POA: Diagnosis not present

## 2017-05-07 ENCOUNTER — Encounter (HOSPITAL_COMMUNITY): Payer: Self-pay

## 2017-05-10 ENCOUNTER — Encounter (HOSPITAL_COMMUNITY)
Admission: RE | Admit: 2017-05-10 | Discharge: 2017-05-10 | Disposition: A | Discharge status: Home / Self Care | Payer: Self-pay | Source: Ambulatory Visit | Attending: Cardiovascular Disease | Admitting: Cardiovascular Disease

## 2017-05-12 ENCOUNTER — Encounter (HOSPITAL_COMMUNITY)
Admission: RE | Admit: 2017-05-12 | Discharge: 2017-05-12 | Disposition: A | Discharge status: Home / Self Care | Payer: Self-pay | Source: Ambulatory Visit | Attending: Cardiovascular Disease | Admitting: Cardiovascular Disease

## 2017-05-14 ENCOUNTER — Encounter (HOSPITAL_COMMUNITY): Payer: Self-pay

## 2017-05-19 ENCOUNTER — Encounter (HOSPITAL_COMMUNITY)
Admission: RE | Admit: 2017-05-19 | Discharge: 2017-05-19 | Disposition: A | Discharge status: Home / Self Care | Payer: Self-pay | Source: Ambulatory Visit | Attending: Cardiovascular Disease | Admitting: Cardiovascular Disease

## 2017-05-21 ENCOUNTER — Ambulatory Visit (INDEPENDENT_AMBULATORY_CARE_PROVIDER_SITE_OTHER): Payer: Medicare Other | Admitting: Internal Medicine

## 2017-05-21 ENCOUNTER — Encounter (HOSPITAL_COMMUNITY)
Admission: RE | Admit: 2017-05-21 | Discharge: 2017-05-21 | Disposition: A | Discharge status: Home / Self Care | Payer: Self-pay | Source: Ambulatory Visit | Attending: Cardiovascular Disease | Admitting: Cardiovascular Disease

## 2017-05-21 ENCOUNTER — Encounter: Payer: Self-pay | Admitting: Internal Medicine

## 2017-05-21 VITALS — BP 124/68 | HR 72 | Temp 98.4°F | Wt 165.0 lb

## 2017-05-21 DIAGNOSIS — S7001XA Contusion of right hip, initial encounter: Secondary | ICD-10-CM

## 2017-05-21 DIAGNOSIS — S7011XA Contusion of right thigh, initial encounter: Secondary | ICD-10-CM | POA: Diagnosis not present

## 2017-05-21 DIAGNOSIS — S8011XA Contusion of right lower leg, initial encounter: Secondary | ICD-10-CM | POA: Diagnosis not present

## 2017-05-21 DIAGNOSIS — T148XXA Other injury of unspecified body region, initial encounter: Secondary | ICD-10-CM | POA: Diagnosis not present

## 2017-05-21 DIAGNOSIS — Z7901 Long term (current) use of anticoagulants: Secondary | ICD-10-CM | POA: Diagnosis not present

## 2017-05-21 DIAGNOSIS — Z8679 Personal history of other diseases of the circulatory system: Secondary | ICD-10-CM | POA: Diagnosis not present

## 2017-05-21 DIAGNOSIS — I25119 Atherosclerotic heart disease of native coronary artery with unspecified angina pectoris: Secondary | ICD-10-CM | POA: Diagnosis not present

## 2017-05-21 DIAGNOSIS — I214 Non-ST elevation (NSTEMI) myocardial infarction: Secondary | ICD-10-CM | POA: Insufficient documentation

## 2017-05-21 LAB — CBC WITH DIFFERENTIAL/PLATELET
BASOS PCT: 0 %
Basophils Absolute: 0 cells/uL (ref 0–200)
EOS PCT: 2 %
Eosinophils Absolute: 102 cells/uL (ref 15–500)
HCT: 39.4 % (ref 35.0–45.0)
Hemoglobin: 12.9 g/dL (ref 11.7–15.5)
LYMPHS ABS: 1326 {cells}/uL (ref 850–3900)
Lymphocytes Relative: 26 %
MCH: 30.3 pg (ref 27.0–33.0)
MCHC: 32.7 g/dL (ref 32.0–36.0)
MCV: 92.5 fL (ref 80.0–100.0)
MONO ABS: 408 {cells}/uL (ref 200–950)
MPV: 9.6 fL (ref 7.5–12.5)
Monocytes Relative: 8 %
NEUTROS ABS: 3264 {cells}/uL (ref 1500–7800)
NEUTROS PCT: 64 %
Platelets: 155 10*3/uL (ref 140–400)
RBC: 4.26 MIL/uL (ref 3.80–5.10)
RDW: 14.6 % (ref 11.0–15.0)
WBC: 5.1 10*3/uL (ref 3.8–10.8)

## 2017-05-23 ENCOUNTER — Encounter: Payer: Self-pay | Admitting: Internal Medicine

## 2017-05-23 NOTE — Progress Notes (Signed)
   Subjective:    Patient ID: Carrie Chung, female    DOB: Aug 14, 1941, 76 y.o.   MRN: [id number]  HPI 76 year old Female on Plavix and aspirin 81 mg daily with history of coronary disease fell at her home recently. She was in the bathroom and one of her Crestor tablets dropped on the floor. She attempted to retrieve that by holding onto a chair that subsequently slipped and she fell onto the floor. She has extensive bruising along her right lateral hip, thigh and lower leg. She is able to ambulate. She has a large hematoma right lateral hip area approximately 5 cm in diameter that is slightly firm but not hot or red.    Review of Systems     Objective:   Physical Exam Extensive ecchymoses right lateral hip right upper thigh and lower leg particularly posterior lower leg. Full range of motion in right lower extremity. Pulses are normal in the leg. Large hematoma 5 cm in diameter right hip area. It does not appear to be infected as it is not hot or red.  CBC drawn and pending to see if she developed anemia as result of this extensive bleeding from fall       Assessment & Plan:  Right hip hematoma  Multiple ecchymoses hip thigh and right lower extremity secondary to a fall  Plan: It will take several weeks for this bruising to resolve. CBC with differential pending. Patient reassured.

## 2017-05-23 NOTE — Patient Instructions (Signed)
It may take 6 weeks for bruising to resolve. Continue Plavix and low-dose aspirin due to history of coronary disease. CBC pending.  Addendum: CBC is normal

## 2017-05-24 ENCOUNTER — Encounter (HOSPITAL_COMMUNITY): Payer: Self-pay

## 2017-05-26 ENCOUNTER — Encounter (HOSPITAL_COMMUNITY)
Admission: RE | Admit: 2017-05-26 | Discharge: 2017-05-26 | Disposition: A | Discharge status: Home / Self Care | Payer: Self-pay | Source: Ambulatory Visit | Attending: Cardiovascular Disease | Admitting: Cardiovascular Disease

## 2017-05-28 ENCOUNTER — Encounter (HOSPITAL_COMMUNITY)
Admission: RE | Admit: 2017-05-28 | Discharge: 2017-05-28 | Disposition: A | Discharge status: Home / Self Care | Payer: Self-pay | Source: Ambulatory Visit | Attending: Cardiovascular Disease | Admitting: Cardiovascular Disease

## 2017-05-29 ENCOUNTER — Other Ambulatory Visit: Payer: Self-pay | Admitting: Internal Medicine

## 2017-05-31 ENCOUNTER — Encounter (HOSPITAL_COMMUNITY): Payer: Self-pay

## 2017-06-01 ENCOUNTER — Other Ambulatory Visit: Payer: Self-pay | Admitting: Internal Medicine

## 2017-06-02 ENCOUNTER — Encounter (HOSPITAL_COMMUNITY)
Admission: RE | Admit: 2017-06-02 | Discharge: 2017-06-02 | Disposition: A | Discharge status: Home / Self Care | Payer: Self-pay | Source: Ambulatory Visit | Attending: Cardiovascular Disease | Admitting: Cardiovascular Disease

## 2017-06-03 ENCOUNTER — Ambulatory Visit: Payer: Medicare Other | Admitting: Neurology

## 2017-06-04 ENCOUNTER — Encounter (HOSPITAL_COMMUNITY): Payer: Self-pay

## 2017-06-07 ENCOUNTER — Other Ambulatory Visit: Payer: Self-pay

## 2017-06-07 ENCOUNTER — Encounter (HOSPITAL_COMMUNITY): Payer: Self-pay

## 2017-06-07 DIAGNOSIS — I10 Essential (primary) hypertension: Secondary | ICD-10-CM

## 2017-06-09 ENCOUNTER — Encounter (HOSPITAL_COMMUNITY): Payer: Self-pay

## 2017-06-11 ENCOUNTER — Encounter (HOSPITAL_COMMUNITY): Payer: Self-pay

## 2017-06-14 ENCOUNTER — Encounter (HOSPITAL_COMMUNITY)
Admission: RE | Admit: 2017-06-14 | Discharge: 2017-06-14 | Disposition: A | Discharge status: Home / Self Care | Payer: Self-pay | Source: Ambulatory Visit | Attending: Cardiovascular Disease | Admitting: Cardiovascular Disease

## 2017-06-16 ENCOUNTER — Encounter (HOSPITAL_COMMUNITY): Payer: Self-pay

## 2017-06-18 ENCOUNTER — Encounter (HOSPITAL_COMMUNITY)
Admission: RE | Admit: 2017-06-18 | Discharge: 2017-06-18 | Disposition: A | Discharge status: Home / Self Care | Payer: Self-pay | Source: Ambulatory Visit | Attending: Cardiovascular Disease | Admitting: Cardiovascular Disease

## 2017-06-21 ENCOUNTER — Encounter (HOSPITAL_COMMUNITY)
Admission: RE | Admit: 2017-06-21 | Discharge: 2017-06-21 | Disposition: A | Discharge status: Home / Self Care | Payer: Self-pay | Source: Ambulatory Visit | Attending: Cardiovascular Disease | Admitting: Cardiovascular Disease

## 2017-06-21 DIAGNOSIS — I214 Non-ST elevation (NSTEMI) myocardial infarction: Secondary | ICD-10-CM | POA: Insufficient documentation

## 2017-06-25 ENCOUNTER — Encounter (HOSPITAL_COMMUNITY): Payer: Self-pay

## 2017-06-27 ENCOUNTER — Other Ambulatory Visit: Payer: Self-pay | Admitting: Internal Medicine

## 2017-06-28 ENCOUNTER — Encounter (HOSPITAL_COMMUNITY): Admission: RE | Admit: 2017-06-28 | Payer: Self-pay | Source: Ambulatory Visit

## 2017-06-28 ENCOUNTER — Ambulatory Visit (INDEPENDENT_AMBULATORY_CARE_PROVIDER_SITE_OTHER): Payer: Medicare Other | Admitting: Neurology

## 2017-06-28 ENCOUNTER — Encounter: Payer: Self-pay | Admitting: Neurology

## 2017-06-28 VITALS — BP 140/85 | HR 83 | Ht 61.0 in | Wt 164.0 lb

## 2017-06-28 DIAGNOSIS — I25119 Atherosclerotic heart disease of native coronary artery with unspecified angina pectoris: Secondary | ICD-10-CM | POA: Diagnosis not present

## 2017-06-28 DIAGNOSIS — R0902 Hypoxemia: Secondary | ICD-10-CM

## 2017-06-28 DIAGNOSIS — R419 Unspecified symptoms and signs involving cognitive functions and awareness: Secondary | ICD-10-CM | POA: Diagnosis not present

## 2017-06-28 NOTE — Telephone Encounter (Signed)
Refill x 6 months 

## 2017-06-28 NOTE — Progress Notes (Signed)
Sleep medicine clinic   Provider:  Larey Seat, M D  Referring Provider: [name], MD Primary Care Physician:  [name], MD  Chief Complaint  Patient presents with  . Follow-up    HPI:  Carrie Chung is a 76 y.o. [place] born female, seen here as a referral from Dr. Tommie Ard Baxley for evaluation of her cognitive abilities, cognitive slowing.   Carrie Chung reports today that she has been feeling a little slower in her cognitive abilities , but that she is not nearly as concerned as her family doctor and her family. The patient states that she was always proud of her abilities to visually recall what her auditory processing memory was not as strong. She always kept nodes and exacting notes while in college and [employer]. Her visual spatial abilities were excellent. She feels that she has somewhat slowed she's not quite as good in these tasks she has always wondered if she is perhaps also less attentive. She has  not misplaced items, but her husband does. He has not gotten lost when driving, she still interested in reading books and different subject matter and she usually finds the page she ended up reading. She has never balance the checkbook. 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 Her mathematical skills have not suffered in any way. She is a poor sleeper she reports with been. She rarely gets 6 hours of nocturnal sleep. At the sleep was very fragmented this has always been the case. She's not concerned about that also she has been reporting snoring, wheezing and coughing. Some swelling of the legs some chest pain muscle cramps death interest in activities, decreased memory decreased energy, anxiety, depression, restless legs. She has no history of shift work. We concentrate today on medication and memory effects. Depression as well. 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 Since then she is followed by Dr. Burt Knack in regular intervals.  06-04-15; Carrie Chung is here to follow-up on her recent sleep study performed on 04-24-15. The patient chief complaint was cognitive decline but she also presented for sleep complaints of snoring, wheezing coughing at night having restless legs and feeling constantly fatigued. She was not excessively daytime sleepy as much as fatigue. The sleep study revealed an AHI of 4.5 and a respiratory disturbance index of 7.6 this is not considered significant apnea. Only during REM sleep did she have an AHI of 11.5 , which would still be considered mild supine sleep also exenterated her apnea index. She did have desaturations and a total time of 106 minutes of oxygen levels at or below 90% was measured. There were no significant periodic limb movements or spontaneous arousal index was the highest at 9.4. hypoxemia seems to be the key -  elevating her nadir we may give her a lot more energy during daytime.  As she has associated coronary artery disease as a core morbidity and she is also using muscle relaxants and sometimes opiates she may have even less of a breathing drive.  Suspected asthma or COPD per PCP note. Oxygen will improve the situation, but  oxygen therapy usually starts at 2 L/m. She has morning headaches without CO2 retention. - hypoxemia? From the perspective of the sleep clinic here I cannot order oxygen therapy without associated CPAP use. However I don't think that this patient needs  CPAP. I would like for  her to speak to the pulmonologist to qualify for oxygen use at home. She will see pulmonology colleague Dr. Elsworth Soho were also read sleep studies tomorrow.  Interval history from 06/02/2016. As the pleasure of seeing Carrie Chung again today and the follow in regular intervals to test her cognitive abilities. She does suffer still from sleep hypoxemia of unknown etiology, and Dr. Elsworth Soho supported 2 L of oxygen at night per  nasal cannula. During a journey to her home country for 10 days she was not using oxygen and noted that there is a quality difference in her sleep. But I cannot offer her an explanation I do think that she needs to continue the treatment. Subsequent pulse oximetries have confirmed that the hypoxemia is still present. She has still wheezing. She underwent a CT chest with contrast, no PE and no beehive lung found, pulmonary function was norma!  Asthma only present with viral infection, bronchiospasm, usually resolving within weeks. Dr Burt Knack evaluated  Her cardiac stress response was attributing the Heart rate to compensation for low SP02.  .  Neurodegenerative origin ???-  not likely  Today she scored 28 out of 30 on a Montral cognitive assessment, which is a very high score. She managed the trail making test which is important for driving and she is also able to do visual spatial drawings. She only missed 1 out of 5 recall words. She still has sleep disturbancies, sleeps better with oxygen. Takes xanax ( Dr. Renold Genta).   06-28-2017, MOCA 29/30 - I have pleasure of seeing Carrie Chung today who has been provided with nocturnal oxygen supplement. She however struggles with the tubing and the nasal cannula and jokingly remarks that she tried to strangle herself 2 times ago. Last night then she did not take the oxygen at all but felt that this is followed by cognitive impairment, fatigue.  She actually acknowledges that she sleeps better with oxygen. She has been doing very well with her cognitive scores she does not appear to be depressed, is in counseling. On Pristiq.  Lost a sister ( of pancreatic cancer ) and a very close friend in [place]( of [place]).  Montreal Cognitive Assessment  06/28/2017 06/02/2016  Visuospatial/ Executive (0/5) 5 4  Naming (0/3) 3 3  Attention: Read list of digits (0/2) 2 2  Attention: Read list of letters (0/1) 1 1  Attention: Serial 7 subtraction starting at 100 (0/3) 3 3  Language:  Repeat phrase (0/2) 2 2  Language : Fluency (0/1) 1 1  Abstraction (0/2) 2 2  Delayed Recall (0/5) 4 4  Orientation (0/6) 6 6  Total 29 28  Adjusted Score (based on education) - 28    Review of Systems: Out of a complete 14 system review, the patient complains of only the following symptoms, and all other reviewed systems are negative.  PHQ9 was endorsed at 12 points, her Epworth sleepiness score was endorsed at 6 points and her fatigue severity score was endorsed at 36.  Social History   Social History  . Marital status: Married    Spouse name: N/A  . Number of children: 1  . Years of education: Coll +   Occupational History  . Retired Marine scientist    Social History Main Topics  . Smoking status: Former Smoker    Types: Cigarettes    Quit date: 04/19/1980  . Smokeless tobacco: Never Used  . Alcohol use 12.0 oz/week    20 Glasses of wine per week     Comment:  socially  . Drug use: No  . Sexual activity: No   Other Topics Concern  . Not on file   Social History Narrative   Caffeine 2 cups tea.    Family History  Problem Relation Age of Onset  . Coronary artery disease Mother        CABG at age 55, PPM  . Colon cancer Father   . Cancer - Other Father        larnyx    Past Medical History:  Diagnosis Date  . Atrial tachycardia (HCC)    a. noted at cardiac rehab 5/13; event monitor ordered to assess for AFib  . CAD (coronary artery disease)    a. NSTEMI 03/2012 ([facility] 03/27/12: pLAD 30%, mLAD 99%, mCFX 95%, mRCA 99% with thrombus, EF 65%);  s/p PTCA/DESx1 to prox-mid RCA 03/27/12 urgently in setting of hypotension/bradycardia, with staged PTCA/Evolve study stent to mid LAD & PTCA/Evolve study stent to prox LCx 03/29/12 ;   echo 03/28/12:EF 60%, Aortic sclerosis without AS, mild RVE, mild reduced RVSF    . Endometrial polyp   . HTN (hypertension)   . Hyperglycemia   . Hyperlipidemia   . Hypokalemia   . MI, old   . Sleep apnea     Past Surgical History:  Procedure Laterality  Date  . APPENDECTOMY  76 years old  . CAROTID STENT  04/2012   x3  . CESAREAN SECTION  1984  . CHOLECYSTECTOMY  1978  . LEFT HEART CATHETERIZATION WITH CORONARY ANGIOGRAM N/A 03/27/2012   Procedure: LEFT HEART CATHETERIZATION WITH CORONARY ANGIOGRAM;  Surgeon: Burnell Blanks, MD;  Location: [facility] CATH LAB;  Service: Cardiovascular;  Laterality: N/A;  . PERCUTANEOUS CORONARY STENT INTERVENTION (PCI-S) N/A 03/27/2012   Procedure: PERCUTANEOUS CORONARY STENT INTERVENTION (PCI-S);  Surgeon: Burnell Blanks, MD;  Location: [facility] CATH LAB;  Service: Cardiovascular;  Laterality: N/A;  . PERCUTANEOUS CORONARY STENT INTERVENTION (PCI-S) N/A 03/29/2012   Procedure: PERCUTANEOUS CORONARY STENT INTERVENTION (PCI-S);  Surgeon: [name], MD;  Location: [facility] CATH LAB;  Service: Cardiovascular;  Laterality: N/A;  . TONSILLECTOMY  76 years old    Current Outpatient Prescriptions  Medication Sig Dispense Refill  . ALPRAZolam (XANAX) 1 MG tablet TAKE 1 TABLET BY MOUTH AT BEDTIME AS NEEDED FOR SLEEP 30 tablet 5  . amLODipine (NORVASC) 5 MG tablet TAKE 1 TABLET BY MOUTH DAILY 90 tablet 3  . aspirin 81 MG tablet Take 1 tablet (81 mg total) by mouth daily.    . Calcium Carbonate-Vitamin D (CALTRATE 600+D) 600-400 MG-UNIT per tablet Take 1 tablet by mouth 2 (two) times daily.     . clopidogrel (PLAVIX) 75 MG tablet TAKE 1 TABLET (75 MG TOTAL) BY MOUTH DAILY. 90 tablet 2  . CRESTOR 10 MG tablet TAKE 1 TABLET (10 MG TOTAL) BY MOUTH DAILY. 90 tablet 2  . desvenlafaxine (PRISTIQ) 50 MG 24 hr tablet Take 50 mg by mouth daily.    Marland Kitchen esomeprazole (NEXIUM) 40 MG capsule Take 40 mg by mouth daily at 12 noon.    . FOLBIC 2.5-25-2 MG TABS tablet TAKE 1 TABLET BY MOUTH DAILY. 90 tablet 3  . hydrochlorothiazide (HYDRODIURIL) 25 MG tablet Take 1 tablet (25 mg total) by mouth daily. 90 tablet 3  . HYDROcodone-acetaminophen (NORCO) 10-325 MG tablet Take 1 tablet by mouth every 8 (eight) hours as needed for severe pain. 90  tablet 0  . metoprolol (LOPRESSOR) 50 MG tablet Take 1.5 tablets (75 mg total) by mouth 2 (two) times  daily. 270 tablet 3  . nitroGLYCERIN (NITROSTAT) 0.4 MG SL tablet PLACE 1 TAB UNDER TONGUE EVERY 5 MINUTES FOR UP TO 3 DOSES AS NEEDED FOR CHEST PAIN 25 tablet 3  . NON FORMULARY Oxygen 2L at night    . PRISTIQ 50 MG 24 hr tablet TAKE 1 TABLET (50 MG TOTAL) BY MOUTH DAILY. 90 tablet 1  . VENTOLIN HFA 108 (90 Base) MCG/ACT inhaler INHALE 2 PUFFS INTO THE LUNGS EVERY 6 (SIX) HOURS AS NEEDED FOR WHEEZING OR SHORTNESS OF BREATH. 18 Inhaler 0   No current facility-administered medications for this visit.     Allergies as of 06/28/2017 - Review Complete 06/28/2017  Allergen Reaction Noted  . Penicillins Hives and Swelling   . Sulfonamide derivatives Hives and Swelling     Vitals: BP 140/85   Pulse 83   Ht 5\' 1"  (1.549 m)   Wt 164 lb (74.4 kg)   BMI 30.99 kg/m  Last Weight:  Wt Readings from Last 1 Encounters:  06/28/17 164 lb (74.4 kg)   Last Height:   Ht Readings from Last 1 Encounters:  06/28/17 5\' 1"  (1.549 m)    Physical exam:  General: The patient is awake, alert and appears not in acute distress. The patient is well groomed. Head: Normocephalic, atraumatic. Neck is supple. Mallampati 4, neck circumference:15 Cardiovascular:  Regular rate and rhythm without  murmurs or carotid bruit, and without distended neck veins. Respiratory: Lungs are clear to auscultation.Skin:  Without evidence of edema, or rash. Trunk: BMI is  elevated and patient  has normal posture.  Neurologic exam :The patient is awake and alert, oriented to place and time. Memory subjective  described as unimpaired . MOCA 28-30, the only 2 points Mrs. Petron missed on her Montral cognitive assessment test were to of the 5 recall words. A score of 28 out of 30 is considered normal. The performed the MMSE prior this 29 out of 30 points and here she missed 1 of the serial 7. There is a normal attention span &  concentration ability. Speech is fluent without dysarthria, dysphonia or aphasia. Mood and affect are appropriate. Cranial nerves: Pupils are equal and briskly reactive to light. Funduscopic exam without  evidence of pallor or edema. Extraocular movements  in vertical and horizontal planes intact and without nystagmus.  Visual fields by finger perimetry are intact. Hearing to finger rub intact.  Facial sensation intact to fine touch. Facial motor strength is symmetric and tongue and uvula move midline. Tongue protrusion into either cheek is normal. Shoulder shrug is normal.    Assessment:  After physical and neurologic examination, review of laboratory studies, imaging, neurophysiology testing and pre-existing records, 40 minute  assessment is that of :  Plan:  Treatment plan and additional workup :  1) continue Pristiq, keep away form wellbutrin for RLS and anxiety side effect.  2) NO OSA  found, but prolonged period of hypoxemia.  Dr.Alva .  3) RLS intermittent , better since off Wellbutrin.   50% of our 25 minute cognitive evaluation today, I spent face-to-face,  discussing the patient's symptoms part of which have already improved. She is less depressed and more talkative.     Carrie Partridge Radwan Cowley MD    06/28/2017    Cc Dr Tommie Ard Baxley

## 2017-06-28 NOTE — Patient Instructions (Signed)
Hypoxia Hypoxia is a condition that happens when there is a lack of oxygen in the body's tissues and organs. When there is not enough oxygen, organs cannot work as they should. This causes serious problems throughout the body and in the brain. What are the causes? This condition may be caused by:  Exposure to high altitude.  A collapsed lung (pneumothorax).  Lung infection (pneumonia).  Lung injury.  Long-term (chronic) lung disease, such as COPD (chronic obstructive pulmonary disease).  Blood collecting in the chest cavity (hemothorax).  Food, saliva, or vomit getting into the airway (aspiration).  Reduced blood flow (ischemia).  Severe blood loss.  Slow or shallow breathing (hypoventilation).  Blood disorders, such as anemia.  Carbon monoxide poisoning.  The heart suddenly stopping (cardiac arrest).  Anesthetic medicines.  Drowning.  Choking.  What are the signs or symptoms? Symptoms of this condition include:  Headache.  Fatigue.  Drowsiness.  Forgetfulness.  Nausea.  Confusion.  Shortness of breath.  Dizziness.  Bluish color of the skin, lips, or nail beds (cyanosis).  Change in consciousness or awareness.  If hypoxia is not treated, it can lead to convulsions, loss of consciousness (coma), or brain damage. How is this diagnosed? This condition may be diagnosed based on:  A physical exam.  Blood tests.  A test that measures how much oxygen is in your blood (pulse oximetry). This is done with a sensor that is placed on your finger, toe, or earlobe.  Chest X-ray.  Tests to check your lung function (pulmonary function tests).  A test to check the electrical activity of your heart (electrocardiogram, ECG).  You may have other tests to determine the cause of your hypoxia. How is this treated? Treatment for this condition depends on what is causing the hypoxia. You will likely be treated with oxygen therapy. This may be done by giving you  oxygen through a face mask or through tubes in your nose. Your health care provider may also recommend other therapies to treat the underlying cause of your hypoxia. Follow these instructions at home:  Take over-the-counter and prescription medicines only as told by your health care provider.  Do not use any products that contain nicotine or tobacco, such as cigarettes and e-cigarettes. If you need help quitting, ask your health care provider.  Avoid secondhand smoke.  Work with your health care provider to manage any chronic conditions you have that may be causing hypoxia, such as COPD.  Keep all follow-up visits as told by your health care provider. This is important. Contact a health care provider if:  You have a fever.  You have trouble breathing, even after treatment.  You become extremely short of breath when you exercise. Get help right away if:  Your shortness of breath gets worse, especially with normal or very little activity.  Your skin, lips, or nail beds have a bluish color.  You become confused or you cannot think properly.  You have chest pain. Summary  Hypoxia is a condition that happens when there is a lack of oxygen in the body's tissues and organs.  If hypoxia is not treated, it can lead to convulsions, loss of consciousness (coma), or brain damage.  Symptoms of hypoxia can include a headache, shortness of breath, confusion, nausea, and a bluish skin color.  Hypoxia has many possible causes, including exposure to high altitude, carbon monoxide poisoning, or other health issues, such as blood disorders or cardiac arrest.  Hypoxia is usually treated with oxygen therapy. This information   is not intended to replace advice given to you by your health care provider. Make sure you discuss any questions you have with your health care provider. Document Released: 01/25/2017 Document Revised: 01/25/2017 Document Reviewed: 01/25/2017 Elsevier Interactive Patient  Education  2018 [employer]  

## 2017-06-30 ENCOUNTER — Encounter (HOSPITAL_COMMUNITY)
Admission: RE | Admit: 2017-06-30 | Discharge: 2017-06-30 | Disposition: A | Discharge status: Home / Self Care | Payer: Self-pay | Source: Ambulatory Visit | Attending: Cardiovascular Disease | Admitting: Cardiovascular Disease

## 2017-07-01 ENCOUNTER — Other Ambulatory Visit: Payer: Self-pay | Admitting: Internal Medicine

## 2017-07-01 NOTE — Telephone Encounter (Signed)
I thought we refilled this recently. Ok to refill x 6 months

## 2017-07-02 ENCOUNTER — Encounter (HOSPITAL_COMMUNITY)
Admission: RE | Admit: 2017-07-02 | Discharge: 2017-07-02 | Disposition: A | Discharge status: Home / Self Care | Payer: Self-pay | Source: Ambulatory Visit | Attending: Cardiovascular Disease | Admitting: Cardiovascular Disease

## 2017-07-02 ENCOUNTER — Telehealth: Payer: Self-pay | Admitting: Internal Medicine

## 2017-07-02 NOTE — Telephone Encounter (Signed)
Called pharmacy they have the refill, and pt is aware. Not sure why they did not notify the pt of this and she is debating switching pharmacies because they mess up her medication frequently

## 2017-07-02 NOTE — Telephone Encounter (Signed)
Please call her. I believe you already did this.

## 2017-07-02 NOTE — Telephone Encounter (Signed)
Patient is requesting a refill on her Xanax 1mg .  States that her back is really giving her a fit.  She is going to call on Monday morning to make an appointment to see you.  Her family is really pressing her to do something regarding her back.  She knows that she has had too much Prednisone and it's time to do something about her back.  She only has 2 or 3 Xanax left.  She just wants to get through the weekend and then she'll call on Monday to make an appointment to see you next week to see what needs to be done.    Pharmacy:  [facility].    Best contact # for patient:  [phone]

## 2017-07-03 ENCOUNTER — Other Ambulatory Visit: Payer: Self-pay | Admitting: Cardiovascular Disease

## 2017-07-05 ENCOUNTER — Telehealth: Payer: Self-pay | Admitting: Pulmonary Disease

## 2017-07-05 ENCOUNTER — Encounter (HOSPITAL_COMMUNITY)
Admission: RE | Admit: 2017-07-05 | Discharge: 2017-07-05 | Disposition: A | Discharge status: Home / Self Care | Payer: Self-pay | Source: Ambulatory Visit | Attending: Cardiovascular Disease | Admitting: Cardiovascular Disease

## 2017-07-05 DIAGNOSIS — J9611 Chronic respiratory failure with hypoxia: Secondary | ICD-10-CM

## 2017-07-05 NOTE — Telephone Encounter (Signed)
OK to order 

## 2017-07-05 NOTE — Telephone Encounter (Signed)
Spoke with pt. States that she has a follow up with RA in November. Reports that she always has an ONO done prior to her follow ups. Pt would like to have this done the 3rd week of October.  RA - please advise if this would be okay to order. Thanks.

## 2017-07-06 NOTE — Telephone Encounter (Signed)
atc X3, line rang to fast busy signal.  Wcb.  

## 2017-07-07 ENCOUNTER — Encounter (HOSPITAL_COMMUNITY): Payer: Self-pay

## 2017-07-07 NOTE — Telephone Encounter (Signed)
Spoke with pt. She is aware that we will order this ONO. Order has been placed. Nothing further was needed.

## 2017-07-09 ENCOUNTER — Encounter (HOSPITAL_COMMUNITY)
Admission: RE | Admit: 2017-07-09 | Discharge: 2017-07-09 | Disposition: A | Discharge status: Home / Self Care | Payer: Self-pay | Source: Ambulatory Visit | Attending: Cardiovascular Disease | Admitting: Cardiovascular Disease

## 2017-07-09 ENCOUNTER — Telehealth: Payer: Self-pay | Admitting: Pulmonary Disease

## 2017-07-09 DIAGNOSIS — J9611 Chronic respiratory failure with hypoxia: Secondary | ICD-10-CM

## 2017-07-09 NOTE — Telephone Encounter (Signed)
On RA please

## 2017-07-09 NOTE — Telephone Encounter (Signed)
Will place a new order for the ONO on RA per RA.

## 2017-07-09 NOTE — Telephone Encounter (Signed)
RA, do you want this test to be on room air?   Please advise. Thanks!

## 2017-07-12 ENCOUNTER — Encounter (HOSPITAL_COMMUNITY): Payer: Self-pay

## 2017-07-14 ENCOUNTER — Encounter (HOSPITAL_COMMUNITY): Payer: Self-pay

## 2017-07-16 ENCOUNTER — Encounter (HOSPITAL_COMMUNITY): Payer: Self-pay

## 2017-07-19 ENCOUNTER — Encounter (HOSPITAL_COMMUNITY)
Admission: RE | Admit: 2017-07-19 | Discharge: 2017-07-19 | Disposition: A | Discharge status: Home / Self Care | Payer: Self-pay | Source: Ambulatory Visit | Attending: Cardiovascular Disease | Admitting: Cardiovascular Disease

## 2017-07-21 ENCOUNTER — Encounter (HOSPITAL_COMMUNITY)
Admission: RE | Admit: 2017-07-21 | Discharge: 2017-07-21 | Disposition: A | Discharge status: Home / Self Care | Payer: Self-pay | Source: Ambulatory Visit | Attending: Cardiovascular Disease | Admitting: Cardiovascular Disease

## 2017-07-21 DIAGNOSIS — I252 Old myocardial infarction: Secondary | ICD-10-CM | POA: Insufficient documentation

## 2017-07-21 DIAGNOSIS — Z955 Presence of coronary angioplasty implant and graft: Secondary | ICD-10-CM | POA: Insufficient documentation

## 2017-07-23 ENCOUNTER — Encounter (HOSPITAL_COMMUNITY): Payer: Self-pay

## 2017-07-24 ENCOUNTER — Other Ambulatory Visit: Payer: Self-pay | Admitting: Cardiovascular Disease

## 2017-07-26 ENCOUNTER — Encounter (HOSPITAL_COMMUNITY)
Admission: RE | Admit: 2017-07-26 | Discharge: 2017-07-26 | Disposition: A | Discharge status: Home / Self Care | Payer: Self-pay | Source: Ambulatory Visit | Attending: Cardiovascular Disease | Admitting: Cardiovascular Disease

## 2017-07-28 ENCOUNTER — Encounter (HOSPITAL_COMMUNITY): Payer: Self-pay

## 2017-07-29 ENCOUNTER — Encounter: Payer: Self-pay | Admitting: Internal Medicine

## 2017-07-29 ENCOUNTER — Ambulatory Visit (INDEPENDENT_AMBULATORY_CARE_PROVIDER_SITE_OTHER): Payer: Medicare Other | Admitting: Internal Medicine

## 2017-07-29 VITALS — BP 130/78 | HR 77 | Temp 98.6°F | Wt 166.0 lb

## 2017-07-29 DIAGNOSIS — I25119 Atherosclerotic heart disease of native coronary artery with unspecified angina pectoris: Secondary | ICD-10-CM

## 2017-07-29 DIAGNOSIS — M519 Unspecified thoracic, thoracolumbar and lumbosacral intervertebral disc disorder: Secondary | ICD-10-CM | POA: Diagnosis not present

## 2017-07-29 DIAGNOSIS — M5416 Radiculopathy, lumbar region: Secondary | ICD-10-CM | POA: Diagnosis not present

## 2017-07-29 MED ORDER — PREDNISONE 10 MG PO TABS: ORAL_TABLET | ORAL | 0 refills | Status: DC

## 2017-07-29 NOTE — Progress Notes (Signed)
   Subjective:    Patient ID: Carrie Chung, female    DOB: 24-Jun-1941, 76 y.o.   MRN: [id number]  HPI 76 year old White Female long-standing history of back pain.   In March 2018 she had a bout of right-sided back pain radiating into right buttock. This was treated with prednisone orally and improved. She also takes hydrocodone sparingly for back pain. She had an MRI of LS-spine in 2014 but has not had one since that time.   MRI in 2014 showed disc desiccation at L1-L2 with a central herniation and upward migration. This indented the thecal sac slightly but did not appear to affect neural structures.   L2-L3 had disc desiccation and bulge.   L3-L4 had desiccation and bulging of the disc with mild facet and ligamentous hypertrophy. Multifactorial stenosis at L3-L4 contributing to symptoms.  L4-L5 showed broad-based disc herniation with slight caudal downturning. Stenosis noted in both lateral recesses right worse than left. Neural compression thought to be likely at this level especially on the right.  L5-S1 desiccation and bulging disc. Mild stenosis of the right subarticular lateral recess.  Patient has not fallen recently. However is complaining bitterly of left back and left leg pain. Pain is along the anterior thigh radiating from around her left back area. It is difficult to sleep at night due to pain. She is walking with a limp and very slowly. Hard to go up steps.          Review of Systems  See above    Objective:   Physical Exam  Neurological: No cranial nerve deficit. Coordination normal.  Straight leg raising is positive at 90 degrees. No foot drop. Muscle strength 4/5 left lower extremity and 5/5 in right lower extremity. DTRs 1+ and symmetrical.  Vitals reviewed.  Range of motion in trunk is fair.       Assessment & Plan:  History of lumbar spinal stenosis and bulging discs.  New onset L1-L2 radiculopathy- needs MRI of LS spine. Last MRI was 2014.  Situation has likely changed in LS spine. She has a lot of pain and discomfort. May need surgical intervention.  Plan: 12 day prednisone 10 mg taper going from 60 mg to 0 mg over 13 days. Could consider epidural steroid injections however she would have to stop Plavix. Try to get MRI of the spine approved. Last study 2014 and she has new symptoms on the left and previously symptoms were on the right.

## 2017-07-30 ENCOUNTER — Encounter (HOSPITAL_COMMUNITY)
Admission: RE | Admit: 2017-07-30 | Discharge: 2017-07-30 | Disposition: A | Discharge status: Home / Self Care | Payer: Self-pay | Source: Ambulatory Visit | Attending: Cardiovascular Disease | Admitting: Cardiovascular Disease

## 2017-07-30 NOTE — Patient Instructions (Signed)
Take prednisone in tapering course as directed. Hydrocodone/APAP as needed for pain. We will try to get MRI approved.

## 2017-08-02 ENCOUNTER — Encounter (HOSPITAL_COMMUNITY)
Admission: RE | Admit: 2017-08-02 | Discharge: 2017-08-02 | Disposition: A | Discharge status: Home / Self Care | Payer: Self-pay | Source: Ambulatory Visit | Attending: Cardiovascular Disease | Admitting: Cardiovascular Disease

## 2017-08-04 ENCOUNTER — Encounter (HOSPITAL_COMMUNITY)
Admission: RE | Admit: 2017-08-04 | Discharge: 2017-08-04 | Disposition: A | Discharge status: Home / Self Care | Payer: Self-pay | Source: Ambulatory Visit | Attending: Cardiovascular Disease | Admitting: Cardiovascular Disease

## 2017-08-04 ENCOUNTER — Telehealth: Payer: Self-pay

## 2017-08-04 DIAGNOSIS — M545 Low back pain: Secondary | ICD-10-CM

## 2017-08-04 DIAGNOSIS — M5416 Radiculopathy, lumbar region: Secondary | ICD-10-CM

## 2017-08-04 NOTE — Telephone Encounter (Signed)
Order for MRI

## 2017-08-06 ENCOUNTER — Encounter (HOSPITAL_COMMUNITY)
Admission: RE | Admit: 2017-08-06 | Discharge: 2017-08-06 | Disposition: A | Discharge status: Home / Self Care | Payer: Self-pay | Source: Ambulatory Visit | Attending: Cardiovascular Disease | Admitting: Cardiovascular Disease

## 2017-08-09 ENCOUNTER — Encounter (HOSPITAL_COMMUNITY)
Admission: RE | Admit: 2017-08-09 | Discharge: 2017-08-09 | Disposition: A | Discharge status: Home / Self Care | Payer: Self-pay | Source: Ambulatory Visit | Attending: Cardiovascular Disease | Admitting: Cardiovascular Disease

## 2017-08-11 ENCOUNTER — Encounter (HOSPITAL_COMMUNITY)
Admission: RE | Admit: 2017-08-11 | Discharge: 2017-08-11 | Disposition: A | Discharge status: Home / Self Care | Payer: Self-pay | Source: Ambulatory Visit | Attending: Cardiovascular Disease | Admitting: Cardiovascular Disease

## 2017-08-13 ENCOUNTER — Encounter (HOSPITAL_COMMUNITY)
Admission: RE | Admit: 2017-08-13 | Discharge: 2017-08-13 | Disposition: A | Discharge status: Home / Self Care | Payer: Self-pay | Source: Ambulatory Visit | Attending: Cardiovascular Disease | Admitting: Cardiovascular Disease

## 2017-08-16 ENCOUNTER — Encounter (HOSPITAL_COMMUNITY): Payer: Self-pay

## 2017-08-17 ENCOUNTER — Ambulatory Visit
Admission: RE | Admit: 2017-08-17 | Discharge: 2017-08-17 | Disposition: A | Discharge status: Home / Self Care | Payer: Medicare Other | Source: Ambulatory Visit | Attending: Internal Medicine | Admitting: Internal Medicine

## 2017-08-17 ENCOUNTER — Encounter: Payer: Self-pay | Admitting: *Deleted

## 2017-08-17 DIAGNOSIS — M48061 Spinal stenosis, lumbar region without neurogenic claudication: Secondary | ICD-10-CM | POA: Diagnosis not present

## 2017-08-17 DIAGNOSIS — M545 Low back pain: Secondary | ICD-10-CM

## 2017-08-17 DIAGNOSIS — M5416 Radiculopathy, lumbar region: Secondary | ICD-10-CM

## 2017-08-18 ENCOUNTER — Encounter (HOSPITAL_COMMUNITY): Payer: Self-pay

## 2017-08-20 ENCOUNTER — Encounter (HOSPITAL_COMMUNITY): Payer: Self-pay

## 2017-08-25 ENCOUNTER — Encounter (HOSPITAL_COMMUNITY)
Admission: RE | Admit: 2017-08-25 | Discharge: 2017-08-25 | Disposition: A | Discharge status: Home / Self Care | Payer: Self-pay | Source: Ambulatory Visit | Attending: Cardiovascular Disease | Admitting: Cardiovascular Disease

## 2017-08-25 DIAGNOSIS — I252 Old myocardial infarction: Secondary | ICD-10-CM | POA: Insufficient documentation

## 2017-08-25 DIAGNOSIS — Z955 Presence of coronary angioplasty implant and graft: Secondary | ICD-10-CM | POA: Insufficient documentation

## 2017-08-26 DIAGNOSIS — Z23 Encounter for immunization: Secondary | ICD-10-CM | POA: Diagnosis not present

## 2017-08-27 ENCOUNTER — Encounter (HOSPITAL_COMMUNITY)
Admission: RE | Admit: 2017-08-27 | Discharge: 2017-08-27 | Disposition: A | Discharge status: Home / Self Care | Payer: Self-pay | Source: Ambulatory Visit | Attending: Cardiovascular Disease | Admitting: Cardiovascular Disease

## 2017-08-27 ENCOUNTER — Other Ambulatory Visit: Payer: Self-pay | Admitting: Cardiovascular Disease

## 2017-08-30 ENCOUNTER — Encounter (HOSPITAL_COMMUNITY): Payer: Self-pay

## 2017-09-01 ENCOUNTER — Encounter (HOSPITAL_COMMUNITY): Payer: Self-pay

## 2017-09-03 ENCOUNTER — Encounter (HOSPITAL_COMMUNITY): Payer: Self-pay

## 2017-09-06 ENCOUNTER — Encounter (HOSPITAL_COMMUNITY): Payer: Self-pay

## 2017-09-06 ENCOUNTER — Encounter: Payer: Self-pay | Admitting: Cardiovascular Disease

## 2017-09-06 ENCOUNTER — Ambulatory Visit (INDEPENDENT_AMBULATORY_CARE_PROVIDER_SITE_OTHER): Payer: Medicare Other | Admitting: Cardiovascular Disease

## 2017-09-06 VITALS — BP 132/80 | HR 80 | Ht 61.0 in | Wt 166.1 lb

## 2017-09-06 DIAGNOSIS — I2510 Atherosclerotic heart disease of native coronary artery without angina pectoris: Secondary | ICD-10-CM

## 2017-09-06 DIAGNOSIS — I10 Essential (primary) hypertension: Secondary | ICD-10-CM | POA: Diagnosis not present

## 2017-09-06 NOTE — Patient Instructions (Signed)

## 2017-09-06 NOTE — Progress Notes (Signed)
Cardiology Office Note Date:  09/08/2017   ID:  Skie, Vitrano October 26, 1941, MRN [id number]  PCP:  [name], MD  Cardiologist:  [name], MD    Chief Complaint  Patient presents with  . Follow-up     History of Present Illness: Carrie Chung is a 76 y.o. female who presents for follow-up of coronary artery disease. She underwent multivessel stenting in 2013 after presenting with a non-ST elevation MI. She was treated with stenting of the right coronary artery initially followed by staged PCI of the LAD and left circumflex. She was treated with drug-eluting stents. Her left ventricular function was normal with an EF of 60%.  She presented with chest pain in 2015 and was evaluated with observation in the hospital. She ruled out for myocardial infarction and underwent a stress echocardiogram demonstrating no ischemic changes.  She is here alone today. Having more problems with her back and legs. Waiting to see Dr Vertell Limber when possible. She complains of shortness of breath with walking stairs, otherwise no particular limitation. She is able to do exercises on machines without shortness of breath. Rare chest pains but no change in pattern.   Past Medical History:  Diagnosis Date  . Atrial tachycardia (HCC)    a. noted at cardiac rehab 5/13; event monitor ordered to assess for AFib  . CAD (coronary artery disease)    a. NSTEMI 03/2012 ([place] 03/27/12: pLAD 30%, mLAD 99%, mCFX 95%, mRCA 99% with thrombus, EF 65%);  s/p PTCA/DESx1 to prox-mid RCA 03/27/12 urgently in setting of hypotension/bradycardia, with staged PTCA/Evolve study stent to mid LAD & PTCA/Evolve study stent to prox LCx 03/29/12 ;   echo 03/28/12:EF 60%, Aortic sclerosis without AS, mild RVE, mild reduced RVSF    . Endometrial polyp   . HTN (hypertension)   . Hyperglycemia   . Hyperlipidemia   . Hypokalemia   . MI, old   . Sleep apnea     Past Surgical History:  Procedure Laterality Date  . APPENDECTOMY  76  years old  . CAROTID STENT  04/2012   x3  . CESAREAN SECTION  1984  . CHOLECYSTECTOMY  1978  . LEFT HEART CATHETERIZATION WITH CORONARY ANGIOGRAM N/A 03/27/2012   Procedure: LEFT HEART CATHETERIZATION WITH CORONARY ANGIOGRAM;  Surgeon: Burnell Blanks, MD;  Location: [facility] CATH LAB;  Service: Cardiovascular;  Laterality: N/A;  . PERCUTANEOUS CORONARY STENT INTERVENTION (PCI-S) N/A 03/27/2012   Procedure: PERCUTANEOUS CORONARY STENT INTERVENTION (PCI-S);  Surgeon: Burnell Blanks, MD;  Location: [facility] CATH LAB;  Service: Cardiovascular;  Laterality: N/A;  . PERCUTANEOUS CORONARY STENT INTERVENTION (PCI-S) N/A 03/29/2012   Procedure: PERCUTANEOUS CORONARY STENT INTERVENTION (PCI-S);  Surgeon: [name], MD;  Location: [facility] CATH LAB;  Service: Cardiovascular;  Laterality: N/A;  . TONSILLECTOMY  76 years old    Current Outpatient Prescriptions  Medication Sig Dispense Refill  . ALPRAZolam (XANAX) 1 MG tablet TAKE 1 TABLET BY MOUTH AT BEDTIME AS NEEDED FOR SLEEP 30 tablet 5  . amLODipine (NORVASC) 5 MG tablet TAKE 1 TABLET BY MOUTH DAILY 90 tablet 3  . aspirin 81 MG tablet Take 1 tablet (81 mg total) by mouth daily.    . Calcium Carbonate-Vitamin D (CALTRATE 600+D) 600-400 MG-UNIT per tablet Take 1 tablet by mouth 2 (two) times daily.     . clopidogrel (PLAVIX) 75 MG tablet TAKE 1 TABLET (75 MG TOTAL) BY MOUTH DAILY. 90 tablet 2  . esomeprazole (NEXIUM) 40 MG capsule Take 40 mg  by mouth daily at 12 noon.    . FOLBIC 2.5-25-2 MG TABS tablet TAKE 1 TABLET BY MOUTH DAILY. 90 tablet 3  . hydrochlorothiazide (HYDRODIURIL) 25 MG tablet TAKE 1 TABLET (25 MG TOTAL) BY MOUTH DAILY. 90 tablet 1  . HYDROcodone-acetaminophen (NORCO) 10-325 MG tablet Take 1 tablet by mouth every 8 (eight) hours as needed for severe pain. 90 tablet 0  . metoprolol (LOPRESSOR) 50 MG tablet Take 1.5 tablets (75 mg total) by mouth 2 (two) times daily. 270 tablet 3  . nitroGLYCERIN (NITROSTAT) 0.4 MG SL tablet PLACE 1 TAB  UNDER TONGUE EVERY 5 MINUTES FOR UP TO 3 DOSES AS NEEDED FOR CHEST PAIN 25 tablet 3  . NON FORMULARY Oxygen 2L at night    . PRISTIQ 50 MG 24 hr tablet TAKE 1 TABLET (50 MG TOTAL) BY MOUTH DAILY. 90 tablet 1  . rosuvastatin (CRESTOR) 10 MG tablet Take 1 tablet (10 mg total) by mouth daily. 90 tablet 1  . VENTOLIN HFA 108 (90 Base) MCG/ACT inhaler INHALE 2 PUFFS INTO THE LUNGS EVERY 6 (SIX) HOURS AS NEEDED FOR WHEEZING OR SHORTNESS OF BREATH. 18 Inhaler 0   No current facility-administered medications for this visit.     Allergies:   Penicillins and Sulfonamide derivatives   Social History:  The patient  reports that she quit smoking about 37 years ago. Her smoking use included Cigarettes. She has never used smokeless tobacco. She reports that she drinks about 12.0 oz of alcohol per week . She reports that she does not use drugs.   Family History:  The patient's family history includes Cancer - Other in her father; Colon cancer in her father; Coronary artery disease in her mother.   ROS:  Please see the history of present illness.  Otherwise, review of systems is positive for back pain.  All other systems are reviewed and negative.    PHYSICAL EXAM: VS:  BP 132/80   Pulse 80   Ht 5\' 1"  (1.549 m)   Wt 75.4 kg (166 lb 1.9 oz)   BMI 31.39 kg/m  , BMI Body mass index is 31.39 kg/m. GEN: Well nourished, well developed, in no acute distress  HEENT: normal  Neck: no JVD, no masses. No carotid bruits Cardiac: RRR without murmur or gallop                Respiratory:  clear to auscultation bilaterally, normal work of breathing GI: soft, nontender, nondistended, + BS MS: no deformity or atrophy  Ext: no pretibial edema, pedal pulses 2+= bilaterally Skin: warm and dry, no rash Neuro:  Strength and sensation are intact Psych: euthymic mood, full affect  EKG:  EKG is not ordered today.  Recent Labs: 03/25/2017: ALT 15; BUN 9; Creat 0.63; Potassium 3.8; Sodium 140; TSH 2.03 05/21/2017:  Hemoglobin 12.9; Platelets 155   Lipid Panel     Component Value Date/Time   CHOL 164 03/25/2017 1102   TRIG 205 (H) 03/25/2017 1102   HDL 79 03/25/2017 1102   CHOLHDL 2.1 03/25/2017 1102   VLDL 41 (H) 03/25/2017 1102   LDLCALC 44 03/25/2017 1102   LDLDIRECT 152.4 03/09/2012 0900      Wt Readings from Last 3 Encounters:  09/06/17 75.4 kg (166 lb 1.9 oz)  07/29/17 75.3 kg (166 lb)  06/28/17 74.4 kg (164 lb)    ASSESSMENT AND PLAN: 1.  CAD, native vessel: with angina. Symptoms stable over time on multiple antianginal drugs reviewed above. No changes recommended today. Remains  on long-term DAPT after 3 vessel stenting.  2. HTN: BP well-controlled.  3. Hyperlipidemia: lipids at goal on crestor. Advised generic rosuvastatin would be fine for cost reduction. LDL < 70 mg/dL. Discussed lifestyle modification for weight loss, elevated triglycerides.   Current medicines are reviewed with the patient today.  The patient does not have concerns regarding medicines.  Labs/ tests ordered today include:  No orders of the defined types were placed in this encounter.  Disposition:   FU 6 months  Signed, [name], MD  09/08/2017 5:59 AM    [address], [place], [place]  [address] Phone: [phone]; Fax: [phone]

## 2017-09-08 ENCOUNTER — Encounter (HOSPITAL_COMMUNITY): Payer: Self-pay

## 2017-09-08 ENCOUNTER — Other Ambulatory Visit: Payer: Self-pay | Admitting: Cardiovascular Disease

## 2017-09-08 MED ORDER — CLOPIDOGREL BISULFATE 75 MG PO TABS: ORAL_TABLET | ORAL | 3 refills | Status: DC

## 2017-09-10 ENCOUNTER — Encounter (HOSPITAL_COMMUNITY): Payer: Self-pay

## 2017-09-13 ENCOUNTER — Encounter (HOSPITAL_COMMUNITY): Payer: Self-pay

## 2017-09-15 ENCOUNTER — Encounter (HOSPITAL_COMMUNITY): Payer: Self-pay

## 2017-09-17 ENCOUNTER — Encounter (HOSPITAL_COMMUNITY): Payer: Self-pay

## 2017-09-20 ENCOUNTER — Encounter (HOSPITAL_COMMUNITY): Payer: Self-pay | Attending: Cardiovascular Disease

## 2017-09-20 DIAGNOSIS — Z955 Presence of coronary angioplasty implant and graft: Secondary | ICD-10-CM | POA: Insufficient documentation

## 2017-09-20 DIAGNOSIS — I252 Old myocardial infarction: Secondary | ICD-10-CM | POA: Insufficient documentation

## 2017-09-22 ENCOUNTER — Encounter (HOSPITAL_COMMUNITY): Payer: Self-pay

## 2017-09-24 ENCOUNTER — Encounter (HOSPITAL_COMMUNITY): Payer: Self-pay

## 2017-09-27 ENCOUNTER — Encounter (HOSPITAL_COMMUNITY): Payer: Self-pay

## 2017-09-29 ENCOUNTER — Encounter (HOSPITAL_COMMUNITY): Payer: Self-pay

## 2017-10-01 ENCOUNTER — Encounter (HOSPITAL_COMMUNITY): Payer: Self-pay

## 2017-10-04 ENCOUNTER — Encounter (HOSPITAL_COMMUNITY): Payer: Self-pay

## 2017-10-04 DIAGNOSIS — Z6830 Body mass index (BMI) 30.0-30.9, adult: Secondary | ICD-10-CM | POA: Diagnosis not present

## 2017-10-04 DIAGNOSIS — I10 Essential (primary) hypertension: Secondary | ICD-10-CM | POA: Diagnosis not present

## 2017-10-04 DIAGNOSIS — M48061 Spinal stenosis, lumbar region without neurogenic claudication: Secondary | ICD-10-CM | POA: Diagnosis not present

## 2017-10-04 DIAGNOSIS — M8580 Other specified disorders of bone density and structure, unspecified site: Secondary | ICD-10-CM | POA: Diagnosis not present

## 2017-10-04 DIAGNOSIS — M25552 Pain in left hip: Secondary | ICD-10-CM | POA: Diagnosis not present

## 2017-10-04 DIAGNOSIS — M5416 Radiculopathy, lumbar region: Secondary | ICD-10-CM | POA: Diagnosis not present

## 2017-10-05 ENCOUNTER — Telehealth: Payer: Self-pay | Admitting: Internal Medicine

## 2017-10-05 NOTE — Telephone Encounter (Signed)
appt made

## 2017-10-05 NOTE — Telephone Encounter (Signed)
Yes she needs to come in.

## 2017-10-05 NOTE — Telephone Encounter (Signed)
Patient saw Dr. Vertell Limber yesterday in consult.  He is putting her on Neurontin.  She was told that she could not continue to take Xanax with this medication.  She wants to know if it's safe to just stop taking the Xanax without titrating off of it.  And, she wants to know if you think she REALLY needs the Xanax since the Neurontin is also used for anxiety somewhat.  Her husband wanted her to ask these questions.    Says she is more than happy to come in for a visit if you want her to to discuss all of these questions surrounding this medication.      Do you want to see her for a visit?    Best number for contact:  [phone]

## 2017-10-06 ENCOUNTER — Encounter (HOSPITAL_COMMUNITY): Payer: Self-pay

## 2017-10-07 ENCOUNTER — Telehealth: Payer: Self-pay | Admitting: Cardiovascular Disease

## 2017-10-07 ENCOUNTER — Ambulatory Visit (INDEPENDENT_AMBULATORY_CARE_PROVIDER_SITE_OTHER): Payer: Medicare Other | Admitting: Internal Medicine

## 2017-10-07 ENCOUNTER — Encounter: Payer: Self-pay | Admitting: Internal Medicine

## 2017-10-07 VITALS — BP 130/68 | HR 81 | Temp 98.3°F | Wt 166.0 lb

## 2017-10-07 DIAGNOSIS — M5416 Radiculopathy, lumbar region: Secondary | ICD-10-CM | POA: Diagnosis not present

## 2017-10-07 DIAGNOSIS — G4709 Other insomnia: Secondary | ICD-10-CM | POA: Diagnosis not present

## 2017-10-07 DIAGNOSIS — I2510 Atherosclerotic heart disease of native coronary artery without angina pectoris: Secondary | ICD-10-CM

## 2017-10-07 NOTE — Progress Notes (Signed)
   Subjective:    Patient ID: Carrie Chung, female    DOB: 10-Jun-1941, 76 y.o.   MRN: [id number]  HPI She saw Dr. Vertell Limber recently who has referred her to Dr. Maryjean Ka for epidural steroid injections for her back.  He also placed her on Neurontin which she has not started yet.  She was wondering how that would work with Xanax.  She takes Xanax nightly for insomnia.  Has done this for a number of years.  I think she can safely start with low-dose Neurontin and cut back on her Xanax to half as much.  We talked about this at length today.  Would prefer she cut back on alcohol consumption as well.  She might be prone to falls in the middle of the night.  This is of concern to me.  She says that Dr. Vertell Limber is concerned about L4-L5 with disc bulge impinging right L4 nerve with severe canal stenosis.  She says surgery might be a possibility if the epidural steroids do not work.    Review of Systems see above     Objective:   Physical Exam Spent 15 minutes speaking with patient about medication management and concerns and I have regarding this.       Assessment & Plan:  She will decrease Xanax by half as much (decrease from 1 mg to 0.5 mg) and try Neurontin for pain relief.  Agree with epidural steroid medications.

## 2017-10-07 NOTE — Progress Notes (Signed)
   Subjective:    Patient ID: Carrie Chung, female    DOB: 1941/09/30, 76 y.o.   MRN: [id number]  HPI  Started on Neurontin 300 mg  Hs by Dr. Vertell Limber. Is to have epidural steroid injections with Dr. Maryjean Ka.  Xanax 1 mg has been prescribed for sleep which she takes nightly.    Review of Systems     Objective:   Physical Exam        Assessment & Plan:

## 2017-10-07 NOTE — Telephone Encounter (Signed)
Instructed patient to keep on current cardiac medications for now but to make sure to call if swelling occurs.  She was grateful for call and agrees with treatment plan.

## 2017-10-07 NOTE — Telephone Encounter (Signed)
New message  Pt verbalized saw Dr.Stern and he has put her on gabapentin starting at 300mg  1x day and increase over time to 3x a day.  and it can produce fluid retention   If it does do Dr.Cooper recommend that she increases her dialectics?

## 2017-10-08 ENCOUNTER — Encounter (HOSPITAL_COMMUNITY): Payer: Self-pay

## 2017-10-10 NOTE — Patient Instructions (Signed)
Decrease Xanax from 1 mg to 0.5 mg and try Neurontin at bedtime as well.  Proceed with epidural steroid injections

## 2017-10-11 ENCOUNTER — Encounter (HOSPITAL_COMMUNITY): Payer: Self-pay

## 2017-10-12 ENCOUNTER — Other Ambulatory Visit: Payer: Self-pay

## 2017-10-12 MED ORDER — DESVENLAFAXINE SUCCINATE ER 50 MG PO TB24: 50.0000 mg | ORAL_TABLET | Freq: Every day | ORAL | 3 refills | Status: DC

## 2017-10-13 ENCOUNTER — Encounter (HOSPITAL_COMMUNITY): Payer: Self-pay

## 2017-10-15 ENCOUNTER — Encounter (HOSPITAL_COMMUNITY): Payer: Self-pay

## 2017-10-18 ENCOUNTER — Encounter (HOSPITAL_COMMUNITY): Payer: Self-pay

## 2017-10-20 ENCOUNTER — Encounter (HOSPITAL_COMMUNITY): Payer: Self-pay

## 2017-10-21 ENCOUNTER — Encounter: Payer: Self-pay | Admitting: Pulmonary Disease

## 2017-10-21 DIAGNOSIS — J449 Chronic obstructive pulmonary disease, unspecified: Secondary | ICD-10-CM | POA: Diagnosis not present

## 2017-10-21 DIAGNOSIS — R0902 Hypoxemia: Secondary | ICD-10-CM | POA: Diagnosis not present

## 2017-10-22 ENCOUNTER — Encounter (HOSPITAL_COMMUNITY): Payer: Self-pay

## 2017-10-25 ENCOUNTER — Encounter (HOSPITAL_COMMUNITY): Payer: Self-pay

## 2017-10-25 DIAGNOSIS — I252 Old myocardial infarction: Secondary | ICD-10-CM | POA: Insufficient documentation

## 2017-10-25 DIAGNOSIS — Z955 Presence of coronary angioplasty implant and graft: Secondary | ICD-10-CM | POA: Insufficient documentation

## 2017-10-27 ENCOUNTER — Encounter (HOSPITAL_COMMUNITY): Payer: Self-pay

## 2017-10-27 DIAGNOSIS — M8589 Other specified disorders of bone density and structure, multiple sites: Secondary | ICD-10-CM | POA: Diagnosis not present

## 2017-10-29 ENCOUNTER — Encounter (HOSPITAL_COMMUNITY): Payer: Self-pay

## 2017-11-01 ENCOUNTER — Ambulatory Visit (INDEPENDENT_AMBULATORY_CARE_PROVIDER_SITE_OTHER): Payer: Medicare Other | Admitting: Pulmonary Disease

## 2017-11-01 ENCOUNTER — Encounter: Payer: Self-pay | Admitting: Pulmonary Disease

## 2017-11-01 ENCOUNTER — Encounter (HOSPITAL_COMMUNITY): Admission: RE | Admit: 2017-11-01 | Payer: Medicare Other | Source: Ambulatory Visit

## 2017-11-01 DIAGNOSIS — J9611 Chronic respiratory failure with hypoxia: Secondary | ICD-10-CM | POA: Diagnosis not present

## 2017-11-01 DIAGNOSIS — I2510 Atherosclerotic heart disease of native coronary artery without angina pectoris: Secondary | ICD-10-CM | POA: Diagnosis not present

## 2017-11-01 DIAGNOSIS — R0600 Dyspnea, unspecified: Secondary | ICD-10-CM | POA: Diagnosis not present

## 2017-11-01 NOTE — Progress Notes (Signed)
   Subjective:    Patient ID: Carrie Chung, female    DOB: 12-28-1940, 76 y.o.   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  She has been compliant with nocturnal oxygen for the most part and feels subjective improvement in quality of sleep and perhaps with some memory issues. She continues to desaturate to 90% on undue exertion when she checks with her pulse oximeter, especially on cardiac rehab  Repeat: No 10/2017 was reviewed and she continues to have desaturation for almost 3 hours during sleep  Significant tests/ events  03/2012 echo normal EF 04/2014 exercise stress test normal 04/2014, 05/2015 CXR normal ONO  10/2017 >>desatn x 3h  PSG 04/2015 ([facility])- did not show significant OSA -AHI 4.5, RDI 7.6/hwhich showed desaturation for 106 minutes.   PFT  nml lung function. No airflow obstruction/restriction. DLCO nml .  CT angio chest 06/2015 neg for PE, mild emphysema .  Bubble echo with gg 1 DD , EF ok , no shunt.    Review of Systems neg for any significant sore throat, dysphagia, itching, sneezing, nasal congestion or excess/ purulent secretions, fever, chills, sweats, unintended wt loss, pleuritic or exertional cp, hempoptysis, orthopnea pnd or change in chronic leg swelling. Also denies presyncope, palpitations, heartburn, abdominal pain, nausea, vomiting, diarrhea or change in bowel or urinary habits, dysuria,hematuria, rash, arthralgias, visual complaints, headache, numbness weakness or ataxia.     Objective:   Physical Exam   Gen. Pleasant, well-nourished, in no distress ENT - no thrush, no post nasal drip Neck: No JVD, no thyromegaly, no carotid bruits Lungs: no use of accessory muscles, no dullness to percussion, clear without rales or rhonchi  Cardiovascular: Rhythm regular,  heart sounds  normal, no murmurs or gallops, no peripheral edema Musculoskeletal: No deformities, no cyanosis or clubbing         Assessment & Plan:

## 2017-11-01 NOTE — Assessment & Plan Note (Signed)
Consider high-resolution CT chest in 1 year.  No crackles on exam and reviewed CT chest from 2016, not impressed that this is ILD, but at the same time no other diagnoses apparent

## 2017-11-01 NOTE — Assessment & Plan Note (Signed)
Continue to use oxygen during sleep. This has subjectively helped her and provide her with more refreshing sleep and improved memory issues

## 2017-11-01 NOTE — Patient Instructions (Signed)
Continue to use oxygen during sleep Call if worse

## 2017-11-02 ENCOUNTER — Telehealth: Payer: Self-pay

## 2017-11-02 MED ORDER — FA-PYRIDOXINE-CYANOCOBALAMIN 2.5-25-2 MG PO TABS: 1.0000 | ORAL_TABLET | Freq: Every day | ORAL | 3 refills | Status: DC

## 2017-11-02 NOTE — Telephone Encounter (Signed)
Pt asked her RX for Folbic be switched to her new pharmacy

## 2017-11-03 ENCOUNTER — Encounter (HOSPITAL_COMMUNITY): Payer: Self-pay

## 2017-11-05 ENCOUNTER — Encounter (HOSPITAL_COMMUNITY): Payer: Self-pay

## 2017-11-08 ENCOUNTER — Encounter (HOSPITAL_COMMUNITY): Payer: Self-pay

## 2017-11-10 ENCOUNTER — Encounter (HOSPITAL_COMMUNITY): Payer: Self-pay

## 2017-11-15 ENCOUNTER — Encounter (HOSPITAL_COMMUNITY): Payer: Self-pay

## 2017-11-17 ENCOUNTER — Encounter (HOSPITAL_COMMUNITY)
Admission: RE | Admit: 2017-11-17 | Discharge: 2017-11-17 | Disposition: A | Discharge status: Home / Self Care | Payer: Self-pay | Source: Ambulatory Visit | Attending: Cardiovascular Disease | Admitting: Cardiovascular Disease

## 2017-11-19 ENCOUNTER — Encounter (HOSPITAL_COMMUNITY)
Admission: RE | Admit: 2017-11-19 | Discharge: 2017-11-19 | Disposition: A | Discharge status: Home / Self Care | Payer: Self-pay | Source: Ambulatory Visit | Attending: Cardiovascular Disease | Admitting: Cardiovascular Disease

## 2017-11-22 ENCOUNTER — Encounter (HOSPITAL_COMMUNITY)
Admission: RE | Admit: 2017-11-22 | Discharge: 2017-11-22 | Disposition: A | Discharge status: Home / Self Care | Payer: Self-pay | Source: Ambulatory Visit | Attending: Cardiovascular Disease | Admitting: Cardiovascular Disease

## 2017-11-22 DIAGNOSIS — Z955 Presence of coronary angioplasty implant and graft: Secondary | ICD-10-CM | POA: Insufficient documentation

## 2017-11-22 DIAGNOSIS — I252 Old myocardial infarction: Secondary | ICD-10-CM | POA: Insufficient documentation

## 2017-11-23 DIAGNOSIS — M48061 Spinal stenosis, lumbar region without neurogenic claudication: Secondary | ICD-10-CM | POA: Diagnosis not present

## 2017-11-24 ENCOUNTER — Encounter (HOSPITAL_COMMUNITY): Payer: Self-pay

## 2017-11-26 ENCOUNTER — Encounter (HOSPITAL_COMMUNITY): Payer: Self-pay

## 2017-11-29 ENCOUNTER — Encounter (HOSPITAL_COMMUNITY): Payer: Self-pay

## 2017-12-01 ENCOUNTER — Encounter (HOSPITAL_COMMUNITY): Payer: Self-pay

## 2017-12-03 ENCOUNTER — Encounter (HOSPITAL_COMMUNITY): Payer: Self-pay

## 2017-12-06 ENCOUNTER — Encounter (HOSPITAL_COMMUNITY): Payer: Self-pay

## 2017-12-07 DIAGNOSIS — M48061 Spinal stenosis, lumbar region without neurogenic claudication: Secondary | ICD-10-CM | POA: Diagnosis not present

## 2017-12-08 ENCOUNTER — Encounter (HOSPITAL_COMMUNITY): Payer: Self-pay

## 2017-12-10 ENCOUNTER — Encounter (HOSPITAL_COMMUNITY): Payer: Self-pay

## 2017-12-15 ENCOUNTER — Encounter (HOSPITAL_COMMUNITY): Payer: Self-pay

## 2017-12-17 ENCOUNTER — Encounter (HOSPITAL_COMMUNITY): Payer: Self-pay

## 2017-12-20 ENCOUNTER — Encounter (HOSPITAL_COMMUNITY): Payer: Self-pay

## 2017-12-22 ENCOUNTER — Encounter (HOSPITAL_COMMUNITY): Payer: Self-pay

## 2017-12-22 DIAGNOSIS — Z955 Presence of coronary angioplasty implant and graft: Secondary | ICD-10-CM | POA: Insufficient documentation

## 2017-12-22 DIAGNOSIS — I252 Old myocardial infarction: Secondary | ICD-10-CM | POA: Insufficient documentation

## 2017-12-24 ENCOUNTER — Encounter (HOSPITAL_COMMUNITY): Payer: Self-pay

## 2017-12-27 ENCOUNTER — Encounter (HOSPITAL_COMMUNITY): Payer: Self-pay

## 2017-12-29 ENCOUNTER — Encounter: Payer: Self-pay | Admitting: Neurology

## 2017-12-29 ENCOUNTER — Ambulatory Visit (INDEPENDENT_AMBULATORY_CARE_PROVIDER_SITE_OTHER): Payer: Medicare Other | Admitting: Neurology

## 2017-12-29 ENCOUNTER — Encounter (HOSPITAL_COMMUNITY): Payer: Self-pay

## 2017-12-29 VITALS — BP 153/79 | HR 70 | Ht 61.0 in | Wt 159.0 lb

## 2017-12-29 DIAGNOSIS — H21562 Pupillary abnormality, left eye: Secondary | ICD-10-CM

## 2017-12-29 DIAGNOSIS — H02402 Unspecified ptosis of left eyelid: Secondary | ICD-10-CM

## 2017-12-29 DIAGNOSIS — F320 Major depressive disorder, single episode, mild: Secondary | ICD-10-CM

## 2017-12-29 DIAGNOSIS — R431 Parosmia: Secondary | ICD-10-CM | POA: Diagnosis not present

## 2017-12-29 MED ORDER — ALPRAZOLAM 1 MG PO TABS: ORAL_TABLET | ORAL | 0 refills | Status: DC

## 2017-12-29 NOTE — Progress Notes (Addendum)
Sleep medicine clinic   Provider:  Larey Seat, M D  Referring Provider: [name], MD Primary Care Physician:  [name], MD  Chief Complaint  Patient presents with  . Follow-up    pt alone rm 11,  pt has been having struggles with nausea and vertigo and dehydration.    HPI:  RICKELL WIEHE is a 77 y.o. [place] born female, seen here as a referral from Dr. Tommie Ard Baxley for evaluation of her cognitive abilities, cognitive slowing.   Mrs. Hartung reports today that she has been feeling a little slower in her cognitive abilities , but that she is not nearly as concerned as her family doctor and her family. The patient states that she was always proud of her abilities to visually recall what her auditory processing memory was not as strong. She always kept nodes and exacting notes while in college and [employer]. Her visual spatial abilities were excellent. She feels that she has somewhat slowed she's not quite as good in these tasks she has always wondered if she is perhaps also less attentive. She has  not misplaced items, but her husband does. He has not gotten lost when driving, she still interested in reading books and different subject matter and she usually finds the page she ended up reading. She has never balance the checkbook. 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 Her mathematical skills have not suffered in any way. She is a poor sleeper she reports with been. She rarely gets 6 hours of nocturnal sleep. At the sleep was very fragmented this has always been the case. She's not concerned about that also she has been reporting snoring, wheezing and coughing. Some swelling of the legs some chest pain muscle cramps death interest in activities, decreased memory decreased energy, anxiety, depression, restless legs. She has no history of shift work. We concentrate today on medication and memory effects. Depression as  well. 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 Since then she is followed by Dr. Burt Knack in regular intervals.  06-04-15; Mrs. Derk is here to follow-up on her recent sleep study performed on 04-24-15. The patient chief complaint was cognitive decline but she also presented for sleep complaints of snoring, wheezing coughing at night having restless legs and feeling constantly fatigued. She was not excessively daytime sleepy as much as fatigue. The sleep study revealed an AHI of 4.5 and a respiratory disturbance index of 7.6 this is not considered significant apnea. Only during REM sleep did she have an AHI of 11.5 , which would still be considered mild supine sleep also exenterated her apnea index. She did have desaturations and a total time of 106 minutes of oxygen levels at or below 90% was measured. There were no significant periodic limb movements or spontaneous arousal index was the highest at 9.4. hypoxemia seems to be the key -  elevating her nadir we may give her a lot more energy during daytime.  As she has associated coronary artery disease as a core morbidity and she is also using muscle relaxants and sometimes opiates she may have even less of a breathing drive.  Suspected asthma or COPD per PCP note. Oxygen will improve the situation, but  oxygen therapy usually starts at 2 L/m. She has morning headaches without CO2 retention. - hypoxemia? From the perspective of the sleep clinic here I cannot order oxygen  therapy without associated CPAP use. However I don't think that this patient needs  CPAP. I would like for her to speak to the pulmonologist to qualify for oxygen use at home. She will see pulmonology colleague Dr. Elsworth Soho were also read sleep studies tomorrow.  Interval history from 06/02/2016. As the pleasure of seeing Mrs. Kundert again today and the follow in regular intervals to test her cognitive abilities. She does suffer  still from sleep hypoxemia of unknown etiology, and Dr. Elsworth Soho supported 2 L of oxygen at night per nasal cannula. During a journey to her home country for 10 days she was not using oxygen and noted that there is a quality difference in her sleep. But I cannot offer her an explanation I do think that she needs to continue the treatment. Subsequent pulse oximetries have confirmed that the hypoxemia is still present. She has still wheezing. She underwent a CT chest with contrast, no PE and no beehive lung found, pulmonary function was norma!  Asthma only present with viral infection, bronchiospasm, usually resolving within weeks. Dr Burt Knack evaluated  Her cardiac stress response was attributing the Heart rate to compensation for low SP02.  .  Neurodegenerative origin ???-  not likely  Today she scored 28 out of 30 on a Montral cognitive assessment, which is a very high score. She managed the trail making test which is important for driving and she is also able to do visual spatial drawings. She only missed 1 out of 5 recall words. She still has sleep disturbancies, sleeps better with oxygen. Takes xanax ( Dr. Renold Genta).   06-28-2017, MOCA 29/30 - I have pleasure of seeing Marney Treloar today who has been provided with nocturnal oxygen supplement. She however struggles with the tubing and the nasal cannula and jokingly remarks that she tried to strangle herself 2 times ago. Last night then she did not take the oxygen at all but felt that this is followed by cognitive impairment, fatigue.  She actually acknowledges that she sleeps better with oxygen. She has been doing very well with her cognitive scores she does not appear to be depressed, is in counseling. On Pristiq.  Lost a sister ( of pancreatic cancer ) and a very close friend in [place]( of [place]).   Interval history: 29 December 2017.  I have the pleasure of seeing Mrs. Dworkin today and we performed a Montreal cognitive assessment in which he scored 29 out of  30 points. Excellent clock drawing.  She has been seen by Terrance Mass, MD  for nerve and back pain- started on gabapentin 300 mg once a day for 4 days. She " couldn't sleep a wink for 6 nights", vertigo, nausea and jittery. Blurred vision.    Montreal Cognitive Assessment  12/29/2017 06/28/2017 06/02/2016  Visuospatial/ Executive (0/5) 4 5 4   Naming (0/3) 3 3 3   Attention: Read list of digits (0/2) 2 2 2   Attention: Read list of letters (0/1) 1 1 1   Attention: Serial 7 subtraction starting at 100 (0/3) 3 3 3   Language: Repeat phrase (0/2) 1 2 2   Language : Fluency (0/1) 1 1 1   Abstraction (0/2) 2 2 2   Delayed Recall (0/5) 5 4 4   Orientation (0/6) 6 6 6   Total 28 29 28   Adjusted Score (based on education) - - 28    Review of Systems: Out of a complete 14 system review, the patient complains of only the following symptoms, and all other reviewed systems are negative.  PHQ9 was  endorsed at 12 points, her Epworth sleepiness score was endorsed at 6 points and her fatigue severity score was endorsed at 36.  Social History   Socioeconomic History  . Marital status: Married    Spouse name: Not on file  . Number of children: 1  . Years of education: Coll +  . Highest education level: Not on file  Social Needs  . Financial resource strain: Not on file  . Food insecurity - worry: Not on file  . Food insecurity - inability: Not on file  . Transportation needs - medical: Not on file  . Transportation needs - non-medical: Not on file  Occupational History  . Occupation: Retired Marine scientist  Tobacco Use  . Smoking status: Former Smoker    Types: Cigarettes    Last attempt to quit: 04/19/1980    Years since quitting: 37.7  . Smokeless tobacco: Never Used  Substance and Sexual Activity  . Alcohol use: Yes    Alcohol/week: 12.0 oz    Types: 20 Glasses of wine per week    Comment: socially  . Drug use: No  . Sexual activity: No  Other Topics Concern  . Not on file  Social History Narrative    Caffeine 2 cups tea.    Family History  Problem Relation Age of Onset  . Coronary artery disease Mother        CABG at age 76, PPM  . Colon cancer Father   . Cancer - Other Father        larnyx    Past Medical History:  Diagnosis Date  . Atrial tachycardia (HCC)    a. noted at cardiac rehab 5/13; event monitor ordered to assess for AFib  . CAD (coronary artery disease)    a. NSTEMI 03/2012 ([place] 03/27/12: pLAD 30%, mLAD 99%, mCFX 95%, mRCA 99% with thrombus, EF 65%);  s/p PTCA/DESx1 to prox-mid RCA 03/27/12 urgently in setting of hypotension/bradycardia, with staged PTCA/Evolve study stent to mid LAD & PTCA/Evolve study stent to prox LCx 03/29/12 ;   echo 03/28/12:EF 60%, Aortic sclerosis without AS, mild RVE, mild reduced RVSF    . Endometrial polyp   . HTN (hypertension)   . Hyperglycemia   . Hyperlipidemia   . Hypokalemia   . MI, old   . Sleep apnea     Past Surgical History:  Procedure Laterality Date  . APPENDECTOMY  77 years old  . CAROTID STENT  04/2012   x3  . CESAREAN SECTION  1984  . CHOLECYSTECTOMY  1978  . LEFT HEART CATHETERIZATION WITH CORONARY ANGIOGRAM N/A 03/27/2012   Procedure: LEFT HEART CATHETERIZATION WITH CORONARY ANGIOGRAM;  Surgeon: Burnell Blanks, MD;  Location: [facility] CATH LAB;  Service: Cardiovascular;  Laterality: N/A;  . PERCUTANEOUS CORONARY STENT INTERVENTION (PCI-S) N/A 03/27/2012   Procedure: PERCUTANEOUS CORONARY STENT INTERVENTION (PCI-S);  Surgeon: Burnell Blanks, MD;  Location: [facility] CATH LAB;  Service: Cardiovascular;  Laterality: N/A;  . PERCUTANEOUS CORONARY STENT INTERVENTION (PCI-S) N/A 03/29/2012   Procedure: PERCUTANEOUS CORONARY STENT INTERVENTION (PCI-S);  Surgeon: [name], MD;  Location: [facility] CATH LAB;  Service: Cardiovascular;  Laterality: N/A;  . TONSILLECTOMY  77 years old    Current Outpatient Medications  Medication Sig Dispense Refill  . ALPRAZolam (XANAX) 1 MG tablet TAKE 1 TABLET BY MOUTH AT BEDTIME AS NEEDED FOR SLEEP  (Patient taking differently: TAKE 0.5 TABLET BY MOUTH AT BEDTIME AS NEEDED FOR SLEEP) 30 tablet 5  . amLODipine (NORVASC) 5 MG tablet TAKE 1 TABLET  BY MOUTH DAILY 90 tablet 3  . aspirin 81 MG tablet Take 1 tablet (81 mg total) by mouth daily.    . Calcium Carbonate-Vitamin D (CALTRATE 600+D) 600-400 MG-UNIT per tablet Take 1 tablet by mouth 2 (two) times daily.     . clopidogrel (PLAVIX) 75 MG tablet TAKE 1 TABLET (75 MG TOTAL) BY MOUTH DAILY. 90 tablet 3  . desvenlafaxine (PRISTIQ) 50 MG 24 hr tablet Take 1 tablet (50 mg total) by mouth daily. 90 tablet 3  . esomeprazole (NEXIUM) 40 MG capsule Take 40 mg by mouth daily at 12 noon.    . folic acid-pyridoxine-cyancobalamin (FOLBIC) 2.5-25-2 MG TABS tablet Take 1 tablet daily by mouth. 90 tablet 3  . hydrochlorothiazide (HYDRODIURIL) 25 MG tablet TAKE 1 TABLET (25 MG TOTAL) BY MOUTH DAILY. 90 tablet 1  . HYDROcodone-acetaminophen (NORCO) 10-325 MG tablet Take 1 tablet by mouth every 8 (eight) hours as needed for severe pain. 90 tablet 0  . metoprolol (LOPRESSOR) 50 MG tablet Take 1.5 tablets (75 mg total) by mouth 2 (two) times daily. 270 tablet 3  . nitroGLYCERIN (NITROSTAT) 0.4 MG SL tablet PLACE 1 TAB UNDER TONGUE EVERY 5 MINUTES FOR UP TO 3 DOSES AS NEEDED FOR CHEST PAIN 25 tablet 3  . NON FORMULARY Oxygen 2L at night    . rosuvastatin (CRESTOR) 10 MG tablet Take 1 tablet (10 mg total) by mouth daily. 90 tablet 1  . VENTOLIN HFA 108 (90 Base) MCG/ACT inhaler INHALE 2 PUFFS INTO THE LUNGS EVERY 6 (SIX) HOURS AS NEEDED FOR WHEEZING OR SHORTNESS OF BREATH. 18 Inhaler 0   No current facility-administered medications for this visit.     Allergies as of 12/29/2017 - Review Complete 12/29/2017  Allergen Reaction Noted  . Penicillins Hives and Swelling   . Sulfonamide derivatives Hives and Swelling     Vitals: BP (!) 153/79   Pulse 70   Ht 5\' 1"  (1.549 m)   Wt 159 lb (72.1 kg)   BMI 30.04 kg/m  Last Weight:  Wt Readings from Last 1  Encounters:  12/29/17 159 lb (72.1 kg)   Last Height:   Ht Readings from Last 1 Encounters:  12/29/17 5\' 1"  (1.549 m)    she has lost weight since last visit. She underwent epidural injection for lumbar back and nerve pain. 12-07-2017.  Physical exam:  General: The patient is awake, alert and appears not in acute distress. The patient is well groomed. Head: Normocephalic, atraumatic. Neck is supple. Mallampati 4, neck circumference:15 Cardiovascular:  Regular rate and rhythm without  murmurs or carotid bruit, and without distended neck veins. Respiratory: Lungs are clear to auscultation.Skin:  Without evidence of edema, or rash. Trunk: BMI is  elevated and patient  has normal posture.  Neurologic exam :The patient is awake and alert, oriented to place and time. Memory subjective  described as unimpaired .  MOCA score of 29 out of 30 ( she did well on clock drawing, trail making and excellent on word finding) = result is  considered normal. There is a normal attention span & concentration ability. Speech is fluent without dysarthria, dysphonia or aphasia. Mood and affect are appropriate.  Cranial nerves: Pupils are unequal- left is disrounded , left side ptosis, and less reactive to light. Funduscopic exam without  evidence of pallor or edema. Extraocular movements  in vertical and horizontal planes intact and without nystagmus.  Visual fields by finger perimetry are intact. Hearing to finger rub intact.  Facial sensation intact  to fine touch. Lower facial motor strength is symmetric and tongue and uvula move midline. Tongue protrusion into either cheek is normal. Shoulder shrug is normal.     Fazit:  She is in less pain- and this allowed her ore physical activity and is Designer, industrial/product - She loves her get- together with lady friends. Her appetite has now returned. .  She has no sign of Dementia. She has been more mobile.  Olfactory auras- also bad taste- and she can be nauseated in  response, no headaches.   Assessment:  After physical and neurologic examination, review of laboratory studies, imaging, neurophysiology testing and pre-existing records, 30 minute  assessment is that of :  Plan:  Treatment plan and additional workup :   1) continue Pristiq, keep away form wellbutrin for RLS and anxiety side effect. She is more outgoing, isolated herself less than in 2017. Still grieving.  2) RLS intermittent , better since off Wellbutrin. 3) olfactory hallucinations- olfactory aura?  Very sensitive to smells, not sure if these smells are even present to others.  4) change in taste- lost taste for salt- since November or December 2018. Unclear origin.  5)  Asymmetry of pupillary size and light constriction, left eye with ptosis. MRI brain ordered, Xanax ordered for MRI claustrophobia.   50% of our 30 minute cognitive evaluation today, I spent face-to-face,  discussing the patient's symptoms part of which have already improved. She is less depressed and more talkative. I like to see her every 12 month , but will agree to q 6 month as she decided .      Asencion Partridge Sharley Keeler MD    12/29/2017    Cc Dr Tommie Ard Baxley        Sleep medicine clinic   Provider:  Larey Seat, M D  Referring Provider: [name], MD Primary Care Physician:  [name], MD  Chief Complaint  Patient presents with  . Follow-up    pt alone rm 11,  pt has been having struggles with nausea and vertigo and dehydration.    HPI:  LALISA KIEHN is a 77 y.o. [place] born female, seen here as a referral from Dr. Tommie Ard Baxley for evaluation of her cognitive abilities, cognitive slowing.   Mrs. Drumwright reports today that she has been feeling a little slower in her cognitive abilities , but that she is not nearly as concerned as her family doctor and her family. The patient states that she was always proud of her abilities to visually recall what her auditory processing memory  was not as strong. She always kept nodes and exacting notes while in college and [employer]. Her visual spatial abilities were excellent. She feels that she has somewhat slowed she's not quite as good in these tasks she has always wondered if she is perhaps also less attentive. She has  not misplaced items, but her husband does. He has not gotten lost when driving, she still interested in reading books and different subject matter and she usually finds the page she ended up reading. She has never balance the checkbook. 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 Her mathematical skills have not suffered in any way. She is a poor sleeper she reports with been. She rarely gets 6 hours of nocturnal sleep. At the sleep was very fragmented this has always been the case. She's not concerned about that also she has been reporting snoring, wheezing  and coughing. Some swelling of the legs some chest pain muscle cramps death interest in activities, decreased memory decreased energy, anxiety, depression, restless legs. She has no history of shift work. We concentrate today on medication and memory effects. Depression as well. 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 Since then she is followed by Dr. Burt Knack in regular intervals.  06-04-15; Mrs. Vandruff is here to follow-up on her recent sleep study performed on 04-24-15. The patient chief complaint was cognitive decline but she also presented for sleep complaints of snoring, wheezing coughing at night having restless legs and feeling constantly fatigued. She was not excessively daytime sleepy as much as fatigue. The sleep study revealed an AHI of 4.5 and a respiratory disturbance index of 7.6 this is not considered significant apnea. Only during REM sleep did she have an AHI of 11.5 , which would still be considered mild supine sleep also exenterated her apnea index. She  did have desaturations and a total time of 106 minutes of oxygen levels at or below 90% was measured. There were no significant periodic limb movements or spontaneous arousal index was the highest at 9.4. hypoxemia seems to be the key -  elevating her nadir we may give her a lot more energy during daytime.  As she has associated coronary artery disease as a core morbidity and she is also using muscle relaxants and sometimes opiates she may have even less of a breathing drive.  Suspected asthma or COPD per PCP note. Oxygen will improve the situation, but  oxygen therapy usually starts at 2 L/m. She has morning headaches without CO2 retention. - hypoxemia? From the perspective of the sleep clinic here I cannot order oxygen therapy without associated CPAP use. However I don't think that this patient needs  CPAP. I would like for her to speak to the pulmonologist to qualify for oxygen use at home. She will see pulmonology colleague Dr. Elsworth Soho were also read sleep studies tomorrow.  Interval history from 06/02/2016. As the pleasure of seeing Mrs. Hesser again today and the follow in regular intervals to test her cognitive abilities. She does suffer still from sleep hypoxemia of unknown etiology, and Dr. Elsworth Soho supported 2 L of oxygen at night per nasal cannula. During a journey to her home country for 10 days she was not using oxygen and noted that there is a quality difference in her sleep. But I cannot offer her an explanation I do think that she needs to continue the treatment. Subsequent pulse oximetries have confirmed that the hypoxemia is still present. She has still wheezing. She underwent a CT chest with contrast, no PE and no beehive lung found, pulmonary function was norma!  Asthma only present with viral infection, bronchiospasm, usually resolving within weeks. Dr Burt Knack evaluated  Her cardiac stress response was attributing the Heart rate to compensation for low SP02.  .  Neurodegenerative origin ???-   not likely  Today she scored 28 out of 30 on a Montral cognitive assessment, which is a very high score. She managed the trail making test which is important for driving and she is also able to do visual spatial drawings. She only missed 1 out of 5 recall words. She still has sleep disturbancies, sleeps better with oxygen. Takes xanax ( Dr. Renold Genta).   06-28-2017, MOCA 29/30 - I have pleasure of seeing Kayleanna Lorman today who has been provided with nocturnal oxygen supplement. She however struggles with the tubing  and the nasal cannula and jokingly remarks that she tried to strangle herself 2 times ago. Last night then she did not take the oxygen at all but felt that this is followed by cognitive impairment, fatigue.  She actually acknowledges that she sleeps better with oxygen. She has been doing very well with her cognitive scores she does not appear to be depressed, is in counseling. On Pristiq.  Lost a sister ( of pancreatic cancer ) and a very close friend in [place]( of [place]).  Montreal Cognitive Assessment  12/29/2017 06/28/2017 06/02/2016  Visuospatial/ Executive (0/5) 4 5 4   Naming (0/3) 3 3 3   Attention: Read list of digits (0/2) 2 2 2   Attention: Read list of letters (0/1) 1 1 1   Attention: Serial 7 subtraction starting at 100 (0/3) 3 3 3   Language: Repeat phrase (0/2) 1 2 2   Language : Fluency (0/1) 1 1 1   Abstraction (0/2) 2 2 2   Delayed Recall (0/5) 5 4 4   Orientation (0/6) 6 6 6   Total 28 29 28   Adjusted Score (based on education) - - 28    Review of Systems: Out of a complete 14 system review, the patient complains of only the following symptoms, and all other reviewed systems are negative.  PHQ9 was endorsed at 12 points, her Epworth sleepiness score was endorsed at 6 points and her fatigue severity score was endorsed at 36.  Social History   Socioeconomic History  . Marital status: Married    Spouse name: Not on file  . Number of children: 1  . Years of education: Coll +  .  Highest education level: Not on file  Social Needs  . Financial resource strain: Not on file  . Food insecurity - worry: Not on file  . Food insecurity - inability: Not on file  . Transportation needs - medical: Not on file  . Transportation needs - non-medical: Not on file  Occupational History  . Occupation: Retired Marine scientist  Tobacco Use  . Smoking status: Former Smoker    Types: Cigarettes    Last attempt to quit: 04/19/1980    Years since quitting: 37.7  . Smokeless tobacco: Never Used  Substance and Sexual Activity  . Alcohol use: Yes    Alcohol/week: 12.0 oz    Types: 20 Glasses of wine per week    Comment: socially  . Drug use: No  . Sexual activity: No  Other Topics Concern  . Not on file  Social History Narrative   Caffeine 2 cups tea.    Family History  Problem Relation Age of Onset  . Coronary artery disease Mother        CABG at age 89, PPM  . Colon cancer Father   . Cancer - Other Father        larnyx    Past Medical History:  Diagnosis Date  . Atrial tachycardia (HCC)    a. noted at cardiac rehab 5/13; event monitor ordered to assess for AFib  . CAD (coronary artery disease)    a. NSTEMI 03/2012 ([place] 03/27/12: pLAD 30%, mLAD 99%, mCFX 95%, mRCA 99% with thrombus, EF 65%);  s/p PTCA/DESx1 to prox-mid RCA 03/27/12 urgently in setting of hypotension/bradycardia, with staged PTCA/Evolve study stent to mid LAD & PTCA/Evolve study stent to prox LCx 03/29/12 ;   echo 03/28/12:EF 60%, Aortic sclerosis without AS, mild RVE, mild reduced RVSF    . Endometrial polyp   . HTN (hypertension)   . Hyperglycemia   .  Hyperlipidemia   . Hypokalemia   . MI, old   . Sleep apnea     Past Surgical History:  Procedure Laterality Date  . APPENDECTOMY  77 years old  . CAROTID STENT  04/2012   x3  . CESAREAN SECTION  1984  . CHOLECYSTECTOMY  1978  . LEFT HEART CATHETERIZATION WITH CORONARY ANGIOGRAM N/A 03/27/2012   Procedure: LEFT HEART CATHETERIZATION WITH CORONARY ANGIOGRAM;   Surgeon: Burnell Blanks, MD;  Location: [facility] CATH LAB;  Service: Cardiovascular;  Laterality: N/A;  . PERCUTANEOUS CORONARY STENT INTERVENTION (PCI-S) N/A 03/27/2012   Procedure: PERCUTANEOUS CORONARY STENT INTERVENTION (PCI-S);  Surgeon: Burnell Blanks, MD;  Location: [facility] CATH LAB;  Service: Cardiovascular;  Laterality: N/A;  . PERCUTANEOUS CORONARY STENT INTERVENTION (PCI-S) N/A 03/29/2012   Procedure: PERCUTANEOUS CORONARY STENT INTERVENTION (PCI-S);  Surgeon: [name], MD;  Location: [facility] CATH LAB;  Service: Cardiovascular;  Laterality: N/A;  . TONSILLECTOMY  77 years old    Current Outpatient Medications  Medication Sig Dispense Refill  . ALPRAZolam (XANAX) 1 MG tablet TAKE 1 TABLET BY MOUTH AT BEDTIME AS NEEDED FOR SLEEP (Patient taking differently: TAKE 0.5 TABLET BY MOUTH AT BEDTIME AS NEEDED FOR SLEEP) 30 tablet 5  . amLODipine (NORVASC) 5 MG tablet TAKE 1 TABLET BY MOUTH DAILY 90 tablet 3  . aspirin 81 MG tablet Take 1 tablet (81 mg total) by mouth daily.    . Calcium Carbonate-Vitamin D (CALTRATE 600+D) 600-400 MG-UNIT per tablet Take 1 tablet by mouth 2 (two) times daily.     . clopidogrel (PLAVIX) 75 MG tablet TAKE 1 TABLET (75 MG TOTAL) BY MOUTH DAILY. 90 tablet 3  . desvenlafaxine (PRISTIQ) 50 MG 24 hr tablet Take 1 tablet (50 mg total) by mouth daily. 90 tablet 3  . esomeprazole (NEXIUM) 40 MG capsule Take 40 mg by mouth daily at 12 noon.    . folic acid-pyridoxine-cyancobalamin (FOLBIC) 2.5-25-2 MG TABS tablet Take 1 tablet daily by mouth. 90 tablet 3  . hydrochlorothiazide (HYDRODIURIL) 25 MG tablet TAKE 1 TABLET (25 MG TOTAL) BY MOUTH DAILY. 90 tablet 1  . HYDROcodone-acetaminophen (NORCO) 10-325 MG tablet Take 1 tablet by mouth every 8 (eight) hours as needed for severe pain. 90 tablet 0  . metoprolol (LOPRESSOR) 50 MG tablet Take 1.5 tablets (75 mg total) by mouth 2 (two) times daily. 270 tablet 3  . nitroGLYCERIN (NITROSTAT) 0.4 MG SL tablet PLACE 1 TAB UNDER  TONGUE EVERY 5 MINUTES FOR UP TO 3 DOSES AS NEEDED FOR CHEST PAIN 25 tablet 3  . NON FORMULARY Oxygen 2L at night    . rosuvastatin (CRESTOR) 10 MG tablet Take 1 tablet (10 mg total) by mouth daily. 90 tablet 1  . VENTOLIN HFA 108 (90 Base) MCG/ACT inhaler INHALE 2 PUFFS INTO THE LUNGS EVERY 6 (SIX) HOURS AS NEEDED FOR WHEEZING OR SHORTNESS OF BREATH. 18 Inhaler 0   No current facility-administered medications for this visit.     Allergies as of 12/29/2017 - Review Complete 12/29/2017  Allergen Reaction Noted  . Penicillins Hives and Swelling   . Sulfonamide derivatives Hives and Swelling     Vitals: BP (!) 153/79   Pulse 70   Ht 5\' 1"  (1.549 m)   Wt 159 lb (72.1 kg)   BMI 30.04 kg/m  Last Weight:  Wt Readings from Last 1 Encounters:  12/29/17 159 lb (72.1 kg)   Last Height:   Ht Readings from Last 1 Encounters:  12/29/17 5\' 1"  (1.549 m)  Physical exam:  General: The patient is awake, alert and appears not in acute distress. The patient is well groomed. Head: Normocephalic, atraumatic. Neck is supple. Mallampati 4, neck circumference:15 Cardiovascular:  Regular rate and rhythm without  murmurs or carotid bruit, and without distended neck veins. Respiratory: Lungs are clear to auscultation.Skin:  Without evidence of edema, or rash. Trunk: BMI is  elevated and patient  has normal posture.  Neurologic exam :The patient is awake and alert, oriented to place and time. Memory subjective  described as unimpaired . MOCA 28-30, the only 2 points Mrs. Irigoyen missed on her [place] cognitive assessment test were to of the 5 recall words. A score of 28 out of 30 is considered normal. The performed the MMSE prior this 29 out of 30 points and here she missed 1 of the serial 7. There is a normal attention span & concentration ability. Speech is fluent without dysarthria, dysphonia or aphasia. Mood and affect are appropriate. Cranial nerves: Pupils are equal and briskly reactive to light.  Funduscopic exam without  evidence of pallor or edema. Extraocular movements  in vertical and horizontal planes intact and without nystagmus.  Visual fields by finger perimetry are intact. Hearing to finger rub intact.  Facial sensation intact to fine touch. Facial motor strength is symmetric and tongue and uvula move midline. Tongue protrusion into either cheek is normal. Shoulder shrug is normal.    Assessment:  After physical and neurologic examination, review of laboratory studies, imaging, neurophysiology testing and pre-existing records, 40 minute  assessment is that of :  Plan:  Treatment plan and additional workup :  1) continue Pristiq, keep away form wellbutrin for RLS and anxiety side effect.  2) NO OSA  found, but prolonged period of hypoxemia.  Dr.Alva .  3) RLS intermittent , better since off Wellbutrin.   50% of our 25 minute cognitive evaluation today, I spent face-to-face,  discussing the patient's symptoms part of which have already improved. She is less depressed and more talkative.     Asencion Partridge Jden Want MD    12/29/2017    Cc Dr Emeline [facility]

## 2017-12-29 NOTE — Patient Instructions (Addendum)
Mild Neurocognitive Disorder Mild neurocognitive disorder (formerly known as mild cognitive impairment) is a mental disorder. It is a slight abnormal decrease in mental function. The areas of mental function affected may include memory, thought, communication, behavior, and completion of tasks. The decrease is noticeable and measurable but for the most part does not interfere with your daily activities. Mild neurocognitive disorder typically occurs in people older than 60 years but can occur earlier. It is not as serious as major neurocognitive disorder (formerly known as dementia) but may lead to a more serious neurocognitive disorder. However, in some cases the condition does not get worse. A few people with this disorder even improve. What are the causes? There are a number of different causes of mild neurocognitive disorder:  Brain disorders associated with abnormal protein deposits, such as Alzheimer's disease, Pick's disease, and Lewy body disease.  Brain disorders associated with abnormal movement, such as Parkinson's disease and Huntington's disease.  Diseases affecting blood vessels in the brain and resulting in mini-strokes.  Certain infections, such as human immunodeficiency virus (HIV) infection.  Traumatic brain injury.  Other medical conditions such as brain tumors, underactive thyroid (hypothyroidism), and vitamin B12 deficiency.  Use of certain prescription medicine and "recreational" drugs.  What are the signs or symptoms? Symptoms of mild neurocognitive disorder include:  Difficulty remembering. You may forget details of recent events, names, or phone numbers. You may forget important social events and appointments or repeatedly forget where you put your car keys.  Difficulty thinking and solving problems. You may have trouble with complex tasks such as paying bills or driving in unfamiliar locations.  Difficulty communicating. You may have trouble finding the right word,  naming an object, forming a sentence that makes sense, or understanding what you read or hear.  Changes in your behavior or personality. You may lose interest in the things that you used to enjoy or withdraw from social situations. You may get angry more easily than usual. You may act before thinking. You may do things in public that you would not usually do. You may hear or see things that are not real (hallucinations). You may believe falsely that others are trying to hurt you (paranoia).  How is this diagnosed? Mild neurocognitive disorder is diagnosed through an assessment by your health care provider. Your health care provider will ask you and your family, friends, or coworkers questions about your symptoms. He or she will ask how often the symptoms occur, how long they have been occurring, whether they are getting worse, and the effect they are having on your life. Your health care provider may refer you to a neurologist or mental health specialist for a detailed evaluation of your mental functions (neuropsychological testing). To identify the cause of your mild neurocognitive disorder, your health care provider may:  Obtain a detailed medical history.  Ask about alcohol and drug use, including prescription medicine.  Perform a physical exam.  Order blood tests and brain imaging exams.  How is this treated? Mild neurocognitive disorder caused by infections, use of certain medicines or "recreational" drugs, and certain medical conditions may improve with treatment of the condition that is causing the disorder. Mild neurocognitive disorder resulting from other causes generally does not improve and may worsen. In these cases, the goal of treatment is to slow progression of the disorder and help you cope with the loss of mental function. Treatments in these cases include:  Medicine. Medicine helps mainly with memory loss and behavioral symptoms.  Talk therapy.   Talk therapy provides education,  emotional support, memory aids, and other ways of making up for decreases in mental function.  Lifestyle changes. These include regular exercise, a healthy diet (including essential omega-3 fatty acids), intellectual stimulation, and increased social interaction.  This information is not intended to replace advice given to you by your health care provider. Make sure you discuss any questions you have with your health care provider. Document Released: 08/09/2013 Document Revised: 05/14/2016 Document Reviewed: 05/01/2013 Elsevier Interactive Patient Education  2017 [employer]  

## 2017-12-29 NOTE — Addendum Note (Signed)
Addended by: Larey Seat on: 12/29/2017 02:21 PM   Modules accepted: Orders

## 2017-12-30 ENCOUNTER — Telehealth (HOSPITAL_COMMUNITY): Payer: Self-pay | Admitting: *Deleted

## 2017-12-30 ENCOUNTER — Encounter: Payer: Self-pay | Admitting: Internal Medicine

## 2017-12-30 ENCOUNTER — Ambulatory Visit (INDEPENDENT_AMBULATORY_CARE_PROVIDER_SITE_OTHER): Payer: Medicare Other | Admitting: Internal Medicine

## 2017-12-30 VITALS — BP 130/80 | HR 82 | Temp 98.0°F | Ht 61.0 in | Wt 159.0 lb

## 2017-12-30 DIAGNOSIS — J0100 Acute maxillary sinusitis, unspecified: Secondary | ICD-10-CM

## 2017-12-30 LAB — COMPREHENSIVE METABOLIC PANEL
A/G RATIO: 1.7 (ref 1.2–2.2)
ALBUMIN: 4.2 g/dL (ref 3.5–4.8)
ALT: 19 IU/L (ref 0–32)
AST: 20 IU/L (ref 0–40)
Alkaline Phosphatase: 82 IU/L (ref 39–117)
BUN / CREAT RATIO: 24 (ref 12–28)
BUN: 17 mg/dL (ref 8–27)
Bilirubin Total: 0.3 mg/dL (ref 0.0–1.2)
CALCIUM: 9.6 mg/dL (ref 8.7–10.3)
CO2: 26 mmol/L (ref 20–29)
CREATININE: 0.72 mg/dL (ref 0.57–1.00)
Chloride: 101 mmol/L (ref 96–106)
GFR calc Af Amer: 93 mL/min/{1.73_m2} (ref >59)
GFR, EST NON AFRICAN AMERICAN: 81 mL/min/{1.73_m2} (ref >59)
GLOBULIN, TOTAL: 2.5 g/dL (ref 1.5–4.5)
Glucose: 104 mg/dL — ABNORMAL HIGH (ref 65–99)
POTASSIUM: 3.7 mmol/L (ref 3.5–5.2)
SODIUM: 145 mmol/L — AB (ref 134–144)
Total Protein: 6.7 g/dL (ref 6.0–8.5)

## 2017-12-30 MED ORDER — HYDROCODONE-HOMATROPINE 5-1.5 MG/5ML PO SYRP: 5.0000 mL | ORAL_SOLUTION | Freq: Three times a day (TID) | ORAL | 0 refills | Status: DC | PRN

## 2017-12-30 MED ORDER — FLUCONAZOLE 150 MG PO TABS: 150.0000 mg | ORAL_TABLET | Freq: Once | ORAL | 1 refills | Status: AC

## 2017-12-30 MED ORDER — AZITHROMYCIN 250 MG PO TABS: ORAL_TABLET | ORAL | 0 refills | Status: DC

## 2017-12-30 MED ORDER — METHYLPREDNISOLONE ACETATE 80 MG/ML IJ SUSP
80.0000 mg | Freq: Once | INTRAMUSCULAR | Status: AC
  Administered 2017-12-30: 80 mg via INTRAMUSCULAR

## 2017-12-31 ENCOUNTER — Encounter (HOSPITAL_COMMUNITY): Payer: Self-pay

## 2018-01-03 ENCOUNTER — Encounter (HOSPITAL_COMMUNITY): Payer: Self-pay

## 2018-01-03 DIAGNOSIS — M48061 Spinal stenosis, lumbar region without neurogenic claudication: Secondary | ICD-10-CM | POA: Diagnosis not present

## 2018-01-03 DIAGNOSIS — I10 Essential (primary) hypertension: Secondary | ICD-10-CM | POA: Diagnosis not present

## 2018-01-03 DIAGNOSIS — Z6829 Body mass index (BMI) 29.0-29.9, adult: Secondary | ICD-10-CM | POA: Diagnosis not present

## 2018-01-04 ENCOUNTER — Telehealth: Payer: Self-pay

## 2018-01-04 NOTE — Telephone Encounter (Signed)
-----   Message from Larey Seat, MD sent at 12/31/2017 11:26 AM EST ----- Non fasting glucose is not significantly elevated. All other metabolic studies ( including sodium ) are normal for age. CD

## 2018-01-04 NOTE — Telephone Encounter (Signed)
I called and spoke with patient and she is aware of these results.

## 2018-01-05 ENCOUNTER — Encounter (HOSPITAL_COMMUNITY): Payer: Self-pay

## 2018-01-07 ENCOUNTER — Encounter (HOSPITAL_COMMUNITY): Payer: Self-pay

## 2018-01-08 ENCOUNTER — Encounter: Payer: Self-pay | Admitting: Internal Medicine

## 2018-01-08 NOTE — Patient Instructions (Signed)
Take Zithromax Z-Pak as directed.  Take Diflucan if needed for Candida vaginitis.  Take Hycodan sparingly for cough.  Rest and drink plenty of fluids.

## 2018-01-08 NOTE — Progress Notes (Signed)
   Subjective:    Patient ID: Carrie Chung, female    DOB: August 30, 1941, 77 y.o.   MRN: [id number]  HPI 77 year old Female in today with complaint of sinusitis type symptoms with maxillary sinus pain and pressure.  Has had nasal congestion.  Has cough.  History of coronary artery disease and is in cardiac rehab.  Says she is unable to take gabapentin.  She had an adverse reaction.    Review of Systems see above     Objective:   Physical Exam TMs and pharynx are clear.  Neck is supple.  She sounds nasally congested.  No adenopathy.  Chest clear to auscultation without rales or wheezing.       Assessment & Plan:  Acute maxillary sinusitis  Plan: Zithromax Z-Pak take 2 tablets day 1 followed by 1 tablet days 2 through 5.  Hycodan 1 teaspoon p.o. every 8 hours as needed cough.  Diflucan 150 mg tablet to take if develops Candida vaginitis while on antibiotics.

## 2018-01-10 ENCOUNTER — Encounter (HOSPITAL_COMMUNITY): Payer: Self-pay

## 2018-01-12 ENCOUNTER — Encounter (HOSPITAL_COMMUNITY)
Admission: RE | Admit: 2018-01-12 | Discharge: 2018-01-12 | Disposition: A | Discharge status: Home / Self Care | Payer: Self-pay | Source: Ambulatory Visit | Attending: Cardiovascular Disease | Admitting: Cardiovascular Disease

## 2018-01-14 ENCOUNTER — Ambulatory Visit
Admission: RE | Admit: 2018-01-14 | Discharge: 2018-01-14 | Disposition: A | Discharge status: Home / Self Care | Payer: Medicare Other | Source: Ambulatory Visit | Attending: Neurology | Admitting: Neurology

## 2018-01-14 ENCOUNTER — Encounter (HOSPITAL_COMMUNITY)
Admission: RE | Admit: 2018-01-14 | Discharge: 2018-01-14 | Disposition: A | Discharge status: Home / Self Care | Payer: Self-pay | Source: Ambulatory Visit | Attending: Cardiovascular Disease | Admitting: Cardiovascular Disease

## 2018-01-14 DIAGNOSIS — R431 Parosmia: Secondary | ICD-10-CM | POA: Diagnosis not present

## 2018-01-14 DIAGNOSIS — H21562 Pupillary abnormality, left eye: Secondary | ICD-10-CM | POA: Diagnosis not present

## 2018-01-14 DIAGNOSIS — H02402 Unspecified ptosis of left eyelid: Secondary | ICD-10-CM

## 2018-01-14 DIAGNOSIS — F320 Major depressive disorder, single episode, mild: Secondary | ICD-10-CM

## 2018-01-17 ENCOUNTER — Encounter (HOSPITAL_COMMUNITY)
Admission: RE | Admit: 2018-01-17 | Discharge: 2018-01-17 | Disposition: A | Discharge status: Home / Self Care | Payer: Self-pay | Source: Ambulatory Visit | Attending: Cardiovascular Disease | Admitting: Cardiovascular Disease

## 2018-01-18 ENCOUNTER — Telehealth: Payer: Self-pay | Admitting: Neurology

## 2018-01-18 NOTE — Telephone Encounter (Signed)
Called the patient and went over the MRI results with her. Patient verbalized understanding and had no further questions.

## 2018-01-18 NOTE — Telephone Encounter (Signed)
-----   Message from Larey Seat, MD sent at 01/17/2018  9:47 AM EST ----- Mild mesio temporal atrophy, mildly progressed since previous imaging study in 2016. Age related - will follow memory yearly

## 2018-01-19 ENCOUNTER — Encounter (HOSPITAL_COMMUNITY)
Admission: RE | Admit: 2018-01-19 | Discharge: 2018-01-19 | Disposition: A | Discharge status: Home / Self Care | Payer: Self-pay | Source: Ambulatory Visit | Attending: Cardiovascular Disease | Admitting: Cardiovascular Disease

## 2018-01-20 ENCOUNTER — Other Ambulatory Visit: Payer: Self-pay | Admitting: Internal Medicine

## 2018-01-20 NOTE — Telephone Encounter (Signed)
Refill x 6 months 

## 2018-01-21 ENCOUNTER — Encounter (HOSPITAL_COMMUNITY)
Admission: RE | Admit: 2018-01-21 | Discharge: 2018-01-21 | Disposition: A | Discharge status: Home / Self Care | Payer: Self-pay | Source: Ambulatory Visit | Attending: Cardiovascular Disease | Admitting: Cardiovascular Disease

## 2018-01-21 DIAGNOSIS — Z955 Presence of coronary angioplasty implant and graft: Secondary | ICD-10-CM | POA: Insufficient documentation

## 2018-01-21 DIAGNOSIS — I252 Old myocardial infarction: Secondary | ICD-10-CM | POA: Insufficient documentation

## 2018-01-24 ENCOUNTER — Encounter (HOSPITAL_COMMUNITY)
Admission: RE | Admit: 2018-01-24 | Discharge: 2018-01-24 | Disposition: A | Discharge status: Home / Self Care | Payer: Self-pay | Source: Ambulatory Visit | Attending: Cardiovascular Disease | Admitting: Cardiovascular Disease

## 2018-01-26 ENCOUNTER — Encounter (HOSPITAL_COMMUNITY)
Admission: RE | Admit: 2018-01-26 | Discharge: 2018-01-26 | Disposition: A | Discharge status: Home / Self Care | Payer: Self-pay | Source: Ambulatory Visit | Attending: Cardiovascular Disease | Admitting: Cardiovascular Disease

## 2018-01-28 ENCOUNTER — Encounter (HOSPITAL_COMMUNITY)
Admission: RE | Admit: 2018-01-28 | Discharge: 2018-01-28 | Disposition: A | Discharge status: Home / Self Care | Payer: Self-pay | Source: Ambulatory Visit | Attending: Cardiovascular Disease | Admitting: Cardiovascular Disease

## 2018-01-31 ENCOUNTER — Encounter (HOSPITAL_COMMUNITY)
Admission: RE | Admit: 2018-01-31 | Discharge: 2018-01-31 | Disposition: A | Discharge status: Home / Self Care | Payer: Medicare Other | Source: Ambulatory Visit | Attending: Cardiovascular Disease | Admitting: Cardiovascular Disease

## 2018-02-02 ENCOUNTER — Encounter (HOSPITAL_COMMUNITY): Payer: Self-pay

## 2018-02-03 ENCOUNTER — Other Ambulatory Visit: Payer: Self-pay | Admitting: Cardiovascular Disease

## 2018-02-04 ENCOUNTER — Encounter (HOSPITAL_COMMUNITY): Payer: Self-pay

## 2018-02-07 ENCOUNTER — Encounter (HOSPITAL_COMMUNITY): Payer: Self-pay

## 2018-02-09 ENCOUNTER — Encounter (HOSPITAL_COMMUNITY): Payer: Self-pay

## 2018-02-11 ENCOUNTER — Encounter (HOSPITAL_COMMUNITY): Payer: Self-pay

## 2018-02-14 ENCOUNTER — Encounter (HOSPITAL_COMMUNITY): Payer: Self-pay

## 2018-02-14 ENCOUNTER — Telehealth (HOSPITAL_COMMUNITY): Payer: Self-pay | Admitting: Internal Medicine

## 2018-02-16 ENCOUNTER — Ambulatory Visit (HOSPITAL_COMMUNITY)
Admission: EM | Admit: 2018-02-16 | Discharge: 2018-02-16 | Disposition: A | Discharge status: Home / Self Care | Payer: Medicare Other | Attending: Urgent Care | Admitting: Urgent Care

## 2018-02-16 ENCOUNTER — Ambulatory Visit (INDEPENDENT_AMBULATORY_CARE_PROVIDER_SITE_OTHER): Payer: Medicare Other

## 2018-02-16 ENCOUNTER — Encounter (HOSPITAL_COMMUNITY): Payer: Self-pay

## 2018-02-16 ENCOUNTER — Encounter (HOSPITAL_COMMUNITY): Payer: Self-pay | Admitting: Emergency Medicine

## 2018-02-16 DIAGNOSIS — M5442 Lumbago with sciatica, left side: Secondary | ICD-10-CM

## 2018-02-16 DIAGNOSIS — R102 Pelvic and perineal pain: Secondary | ICD-10-CM | POA: Diagnosis not present

## 2018-02-16 DIAGNOSIS — M545 Low back pain: Secondary | ICD-10-CM | POA: Diagnosis not present

## 2018-02-16 DIAGNOSIS — G8929 Other chronic pain: Secondary | ICD-10-CM | POA: Diagnosis not present

## 2018-02-16 DIAGNOSIS — S32030A Wedge compression fracture of third lumbar vertebra, initial encounter for closed fracture: Secondary | ICD-10-CM | POA: Diagnosis not present

## 2018-02-16 DIAGNOSIS — M549 Dorsalgia, unspecified: Secondary | ICD-10-CM

## 2018-02-16 MED ORDER — HYDROCODONE-ACETAMINOPHEN 5-325 MG PO TABS: 2.0000 | ORAL_TABLET | Freq: Four times a day (QID) | ORAL | 0 refills | Status: DC | PRN

## 2018-02-16 NOTE — ED Provider Notes (Signed)
MRN: [id number] DOB: 1941/01/19  Subjective:   Carrie Chung is a 77 y.o. female presenting for persistent low back pain that radiates to her left thigh s/p fall on 02/06/2018. Patient has a history of osteopenia, history of thoracic compression fracture at T12. Her neurosurgeon is Dr. Lovenia Shuck. She contacted his office and was advised to report to our clinic for x-rays of the hip and back to r/o fracture. Patient has script for Vicodin which she uses for her pain. She is unable to use ibuprofen.   No current facility-administered medications for this encounter.   Current Outpatient Medications:  .  ALPRAZolam (XANAX) 1 MG tablet, TAKE 1 TABLET BY MOUTH EVERY NIGHT AT BEDTIME AS NEEDED FOR SLEEP, Disp: 30 tablet, Rfl: 5 .  amLODipine (NORVASC) 5 MG tablet, TAKE 1 TABLET BY MOUTH DAILY, Disp: 90 tablet, Rfl: 3 .  aspirin 81 MG tablet, Take 1 tablet (81 mg total) by mouth daily., Disp: , Rfl:  .  azithromycin (ZITHROMAX) 250 MG tablet, Take 2 po day 1 followed by one po days 2-5, Disp: 6 tablet, Rfl: 0 .  Calcium Carbonate-Vitamin D (CALTRATE 600+D) 600-400 MG-UNIT per tablet, Take 1 tablet by mouth 2 (two) times daily. , Disp: , Rfl:  .  clopidogrel (PLAVIX) 75 MG tablet, TAKE 1 TABLET (75 MG TOTAL) BY MOUTH DAILY., Disp: 90 tablet, Rfl: 3 .  desvenlafaxine (PRISTIQ) 50 MG 24 hr tablet, Take 1 tablet (50 mg total) by mouth daily., Disp: 90 tablet, Rfl: 3 .  esomeprazole (NEXIUM) 40 MG capsule, Take 40 mg by mouth daily at 12 noon., Disp: , Rfl:  .  folic acid-pyridoxine-cyancobalamin (FOLBIC) 2.5-25-2 MG TABS tablet, Take 1 tablet daily by mouth., Disp: 90 tablet, Rfl: 3 .  hydrochlorothiazide (HYDRODIURIL) 25 MG tablet, TAKE 1 TABLET BY MOUTH EVERY DAY, Disp: 90 tablet, Rfl: 1 .  HYDROcodone-acetaminophen (NORCO) 10-325 MG tablet, Take 1 tablet by mouth every 8 (eight) hours as needed for severe pain., Disp: 90 tablet, Rfl: 0 .  HYDROcodone-homatropine (HYCODAN) 5-1.5 MG/5ML syrup, Take 5 mLs  by mouth every 8 (eight) hours as needed for cough., Disp: 120 mL, Rfl: 0 .  metoprolol (LOPRESSOR) 50 MG tablet, Take 1.5 tablets (75 mg total) by mouth 2 (two) times daily., Disp: 270 tablet, Rfl: 3 .  nitroGLYCERIN (NITROSTAT) 0.4 MG SL tablet, PLACE 1 TAB UNDER TONGUE EVERY 5 MINUTES FOR UP TO 3 DOSES AS NEEDED FOR CHEST PAIN, Disp: 25 tablet, Rfl: 3 .  NON FORMULARY, Oxygen 2L at night, Disp: , Rfl:  .  rosuvastatin (CRESTOR) 10 MG tablet, Take 1 tablet (10 mg total) by mouth daily., Disp: 90 tablet, Rfl: 1 .  VENTOLIN HFA 108 (90 Base) MCG/ACT inhaler, INHALE 2 PUFFS INTO THE LUNGS EVERY 6 (SIX) HOURS AS NEEDED FOR WHEEZING OR SHORTNESS OF BREATH., Disp: 18 Inhaler, Rfl: 0   Carrie Chung is allergic to penicillins and sulfonamide derivatives.  Carrie Chung  has a past medical history of Atrial tachycardia ([place]), CAD (coronary artery disease), Endometrial polyp, HTN (hypertension), Hyperglycemia, Hyperlipidemia, Hypokalemia, MI, old, and Sleep apnea. Also  has a past surgical history that includes Cholecystectomy (1978); Appendectomy (77 years old); Tonsillectomy (77 years old); Cesarean section (1984); Carotid stent (04/2012); left heart catheterization with coronary angiogram (N/A, 03/27/2012); percutaneous coronary stent intervention (pci-s) (N/A, 03/27/2012); and percutaneous coronary stent intervention (pci-s) (N/A, 03/29/2012).  Objective:   Vitals: BP (!) 142/69   Pulse 90   Temp 98.9 F (37.2 C) (Oral)   Resp 16  Wt 159 lb (72.1 kg)   SpO2 93%   BMI 30.04 kg/m   Physical Exam  Constitutional: She is oriented to person, place, and time. She appears well-developed and well-nourished.  Cardiovascular: Normal rate.  Pulmonary/Chest: Effort normal.  Musculoskeletal:       Lumbar back: She exhibits decreased range of motion and tenderness (over area depicted). She exhibits no bony tenderness, no swelling, no edema, no deformity, no laceration and no spasm.       Back:  Neurological: She is alert  and oriented to person, place, and time. She displays normal reflexes. Coordination (favoring low back) abnormal.  Skin: Skin is warm and dry.  Psychiatric: She has a normal mood and affect.   Dg Lumbar Spine Complete  Result Date: 02/16/2018 CLINICAL DATA:  Status post fall, low back pain and hip pain EXAM: LUMBAR SPINE - COMPLETE 4+ VIEW COMPARISON:  10/04/2017 FINDINGS: There are 5 nonrib bearing lumbar-type vertebral bodies. There is a chronic T12 vertebral body compression fracture with approximately 15% height loss. There is a L3 vertebral body compression fracture new compared with 10/04/2017 with approximately 20% height loss. The remainder the vertebral body heights are maintained. There is generalized osteopenia. The alignment is anatomic. There is no static listhesis. There is no spondylolysis. There is degenerative disc disease with disc height loss at L4-5, L5-S1 and to lesser extent L1-2. There is bilateral facet arthropathy at L3-4, L4-5 and L5-S1. The SI joints are unremarkable. There is abdominal aortic atherosclerosis. IMPRESSION: 1. Age indeterminate L3 vertebral body compression fracture which is new compared with 10/04/2017 concerning for an acute or subacute fracture. 2. Lumbar spine spondylosis as described above. Electronically Signed   By: Kathreen Devoid   On: 02/16/2018 13:23   Dg Pelvis 1-2 Views  Result Date: 02/16/2018 CLINICAL DATA:  [place] on 02/06/2018.  Persistent pain. EXAM: PELVIS - 1-2 VIEW COMPARISON:  None. FINDINGS: Both hips are normally located. Moderate bilateral hip joint degenerative changes. No acute fractures identified. The pubic symphysis and SI joints are intact. No pelvic fractures. Surgical changes involving the right iliac crest. IMPRESSION: No acute fracture. Electronically Signed   By: Marijo Sanes M.D.   On: 02/16/2018 13:06   Assessment and Plan :   Closed compression fracture of third lumbar vertebra, initial encounter ([place])  Acute midline low  back pain with left-sided sciatica  Chronic back pain, unspecified back location, unspecified back pain laterality  I attempted to coordinate treatment plan with the patient's neurosurgery office but was unable to get in touch with her physician or colleague. Patient was advised that she would get a call back with plan for follow up. Refilled her hydrocodone at 5mg . Counseled patient on potential for adverse effects with medications prescribed today, patient verbalized understanding. ER and return-to-clinic precautions discussed, patient verbalized understanding.    Jaynee Eagles, PA-C 02/16/18 [id number]

## 2018-02-16 NOTE — ED Triage Notes (Addendum)
PT reports she fell flat on her back 2/17 on a tile floor. PT had an epidural in back 2 months prior which worked well.   PT reports pain across L2-L5.   2 days later pain improved. Pain returned day 3 with pain down left leg.   Neuro surgery instructed Pt to come to [facility] for back and pelvis xrays.

## 2018-02-16 NOTE — Discharge Instructions (Addendum)
Please make sure you follow up with neurosurgery for continued management of your compression fracture. If you develop weakness, worsening back pain, inability to control bowel movements or urination, please report to the ER immediately by ambulance or if someone can drive you to the ER.

## 2018-02-17 ENCOUNTER — Other Ambulatory Visit: Payer: Self-pay | Admitting: Cardiovascular Disease

## 2018-02-18 ENCOUNTER — Encounter (HOSPITAL_COMMUNITY): Payer: Self-pay | Attending: Cardiovascular Disease

## 2018-02-18 DIAGNOSIS — I2510 Atherosclerotic heart disease of native coronary artery without angina pectoris: Secondary | ICD-10-CM | POA: Insufficient documentation

## 2018-02-21 ENCOUNTER — Encounter (HOSPITAL_COMMUNITY): Payer: Self-pay

## 2018-02-23 ENCOUNTER — Encounter (HOSPITAL_COMMUNITY): Payer: Self-pay

## 2018-02-24 DIAGNOSIS — M48061 Spinal stenosis, lumbar region without neurogenic claudication: Secondary | ICD-10-CM | POA: Diagnosis not present

## 2018-02-24 DIAGNOSIS — M8580 Other specified disorders of bone density and structure, unspecified site: Secondary | ICD-10-CM | POA: Diagnosis not present

## 2018-02-24 DIAGNOSIS — M5416 Radiculopathy, lumbar region: Secondary | ICD-10-CM | POA: Diagnosis not present

## 2018-02-24 DIAGNOSIS — S32030D Wedge compression fracture of third lumbar vertebra, subsequent encounter for fracture with routine healing: Secondary | ICD-10-CM | POA: Diagnosis not present

## 2018-02-24 DIAGNOSIS — I10 Essential (primary) hypertension: Secondary | ICD-10-CM | POA: Diagnosis not present

## 2018-02-24 DIAGNOSIS — Z6829 Body mass index (BMI) 29.0-29.9, adult: Secondary | ICD-10-CM | POA: Diagnosis not present

## 2018-02-25 ENCOUNTER — Encounter (HOSPITAL_COMMUNITY): Payer: Self-pay

## 2018-02-28 ENCOUNTER — Encounter (HOSPITAL_COMMUNITY): Payer: Self-pay

## 2018-02-28 ENCOUNTER — Other Ambulatory Visit: Payer: Self-pay | Admitting: Neurosurgery

## 2018-02-28 DIAGNOSIS — M48061 Spinal stenosis, lumbar region without neurogenic claudication: Secondary | ICD-10-CM

## 2018-03-02 ENCOUNTER — Encounter (HOSPITAL_COMMUNITY): Payer: Self-pay

## 2018-03-04 ENCOUNTER — Encounter (HOSPITAL_COMMUNITY): Payer: Self-pay

## 2018-03-04 ENCOUNTER — Ambulatory Visit (INDEPENDENT_AMBULATORY_CARE_PROVIDER_SITE_OTHER): Payer: Medicare Other | Admitting: Cardiovascular Disease

## 2018-03-04 ENCOUNTER — Encounter: Payer: Self-pay | Admitting: Cardiovascular Disease

## 2018-03-04 VITALS — BP 120/56 | HR 66 | Ht 61.0 in | Wt 157.0 lb

## 2018-03-04 DIAGNOSIS — E782 Mixed hyperlipidemia: Secondary | ICD-10-CM

## 2018-03-04 DIAGNOSIS — I25119 Atherosclerotic heart disease of native coronary artery with unspecified angina pectoris: Secondary | ICD-10-CM | POA: Diagnosis not present

## 2018-03-04 DIAGNOSIS — I10 Essential (primary) hypertension: Secondary | ICD-10-CM

## 2018-03-04 NOTE — Progress Notes (Signed)
Cardiology Office Note Date:  03/06/2018   ID:  Carrie Chung 1941-08-14, MRN [id number]  PCP:  [name], MD  Cardiologist:  [name], MD    Chief Complaint  Patient presents with  . Follow-up    CAD     History of Present Illness: Carrie Chung is a 77 y.o. female who presents for follow-up of coronary artery disease and chronic angina.  She underwent multivessel stenting in 2013 after presenting with a non-ST elevation MI. She was treated with stenting of the right coronary artery initially followed by staged PCI of the LAD and left circumflex. She was treated with drug-eluting stents. Her left ventricular function was normal with an EF of 60%.  The patient is here alone today.  She has been limited by back problems and has been following closely with Dr. Vertell Limber.  She is in a back brace today.  She has not had any recent cardiac-related problems.  She specifically denies episodes of chest discomfort, shortness of breath, edema, orthopnea, or PND.  She has had no heart palpitations.  Past Medical History:  Diagnosis Date  . Atrial tachycardia (HCC)    a. noted at cardiac rehab 5/13; event monitor ordered to assess for AFib  . CAD (coronary artery disease)    a. NSTEMI 03/2012 ([place] 03/27/12: pLAD 30%, mLAD 99%, mCFX 95%, mRCA 99% with thrombus, EF 65%);  s/p PTCA/DESx1 to prox-mid RCA 03/27/12 urgently in setting of hypotension/bradycardia, with staged PTCA/Evolve study stent to mid LAD & PTCA/Evolve study stent to prox LCx 03/29/12 ;   echo 03/28/12:EF 60%, Aortic sclerosis without AS, mild RVE, mild reduced RVSF    . Endometrial polyp   . HTN (hypertension)   . Hyperglycemia   . Hyperlipidemia   . Hypokalemia   . MI, old   . Sleep apnea     Past Surgical History:  Procedure Laterality Date  . APPENDECTOMY  77 years old  . CAROTID STENT  04/2012   x3  . CESAREAN SECTION  1984  . CHOLECYSTECTOMY  1978  . LEFT HEART CATHETERIZATION WITH CORONARY ANGIOGRAM  N/A 03/27/2012   Procedure: LEFT HEART CATHETERIZATION WITH CORONARY ANGIOGRAM;  Surgeon: Burnell Blanks, MD;  Location: [facility] CATH LAB;  Service: Cardiovascular;  Laterality: N/A;  . PERCUTANEOUS CORONARY STENT INTERVENTION (PCI-S) N/A 03/27/2012   Procedure: PERCUTANEOUS CORONARY STENT INTERVENTION (PCI-S);  Surgeon: Burnell Blanks, MD;  Location: [facility] CATH LAB;  Service: Cardiovascular;  Laterality: N/A;  . PERCUTANEOUS CORONARY STENT INTERVENTION (PCI-S) N/A 03/29/2012   Procedure: PERCUTANEOUS CORONARY STENT INTERVENTION (PCI-S);  Surgeon: [name], MD;  Location: [facility] CATH LAB;  Service: Cardiovascular;  Laterality: N/A;  . TONSILLECTOMY  77 years old    Current Outpatient Medications  Medication Sig Dispense Refill  . ALPRAZolam (XANAX) 1 MG tablet TAKE 1 TABLET BY MOUTH EVERY NIGHT AT BEDTIME AS NEEDED FOR SLEEP 30 tablet 5  . amLODipine (NORVASC) 5 MG tablet TAKE 1 TABLET BY MOUTH DAILY 90 tablet 3  . aspirin 81 MG tablet Take 1 tablet (81 mg total) by mouth daily.    . Calcium Carbonate-Vitamin D (CALTRATE 600+D) 600-400 MG-UNIT per tablet Take 1 tablet by mouth 2 (two) times daily.     . clopidogrel (PLAVIX) 75 MG tablet TAKE 1 TABLET (75 MG TOTAL) BY MOUTH DAILY. 90 tablet 3  . desvenlafaxine (PRISTIQ) 50 MG 24 hr tablet Take 1 tablet (50 mg total) by mouth daily. 90 tablet 3  . esomeprazole (  NEXIUM) 40 MG capsule Take 40 mg by mouth daily at 12 noon.    . folic acid-pyridoxine-cyancobalamin (FOLBIC) 2.5-25-2 MG TABS tablet Take 1 tablet daily by mouth. 90 tablet 3  . hydrochlorothiazide (HYDRODIURIL) 25 MG tablet TAKE 1 TABLET BY MOUTH EVERY DAY 90 tablet 1  . HYDROcodone-acetaminophen (NORCO) 10-325 MG tablet Take 1 tablet by mouth every 8 (eight) hours as needed for severe pain. 90 tablet 0  . HYDROcodone-homatropine (HYCODAN) 5-1.5 MG/5ML syrup Take 5 mLs by mouth every 8 (eight) hours as needed for cough. 120 mL 0  . metoprolol (LOPRESSOR) 50 MG tablet Take 1.5 tablets  (75 mg total) by mouth 2 (two) times daily. 270 tablet 3  . nitroGLYCERIN (NITROSTAT) 0.4 MG SL tablet PLACE 1 TAB UNDER TONGUE EVERY 5 MINUTES FOR UP TO 3 DOSES AS NEEDED FOR CHEST PAIN 25 tablet 3  . NON FORMULARY Oxygen 2L at night    . oxyCODONE-acetaminophen (PERCOCET) 10-325 MG tablet Take 1 tablet by mouth every 6 (six) hours as needed for pain.  0  . rosuvastatin (CRESTOR) 10 MG tablet TAKE 1 TABLET BY MOUTH EVERY DAY 30 tablet 0  . VENTOLIN HFA 108 (90 Base) MCG/ACT inhaler INHALE 2 PUFFS INTO THE LUNGS EVERY 6 (SIX) HOURS AS NEEDED FOR WHEEZING OR SHORTNESS OF BREATH. 18 Inhaler 0   No current facility-administered medications for this visit.     Allergies:   Penicillins and Sulfonamide derivatives   Social History:  The patient  reports that she quit smoking about 37 years ago. Her smoking use included cigarettes. she has never used smokeless tobacco. She reports that she drinks about 12.0 oz of alcohol per week. She reports that she does not use drugs.   Family History:  The patient's family history includes Cancer - Other in her father; Colon cancer in her father; Coronary artery disease in her mother.    ROS:  Please see the history of present illness.  Otherwise, review of systems is positive for depression, back pain, easy bruising, leg pain, snoring, anxiety.  All other systems are reviewed and negative.   PHYSICAL EXAM: VS:  BP (!) 120/56   Pulse 66   Ht 5\' 1"  (1.549 m)   Wt 157 lb (71.2 kg)   BMI 29.66 kg/m  , BMI Body mass index is 29.66 kg/m. GEN: Well nourished, well developed, in no acute distress  HEENT: normal  Neck: no JVD, no masses. No carotid bruits Cardiac: RRR without murmur or gallop      Respiratory:  clear to auscultation bilaterally, normal work of breathing GI: soft, nontender, nondistended, + BS MS: no deformity or atrophy  Ext: no pretibial edema, pedal pulses 2+= bilaterally Skin: warm and dry, no rash Neuro:  Strength and sensation are  intact Psych: euthymic mood, full affect  EKG:  EKG is ordered today. The ekg ordered today shows normal sinus rhythm 66 bpm, nonspecific T wave abnormality, no changes from baseline.  Recent Labs: 03/25/2017: TSH 2.03 05/21/2017: Hemoglobin 12.9; Platelets 155 12/29/2017: ALT 19; BUN 17; Creatinine, Ser 0.72; Potassium 3.7; Sodium 145   Lipid Panel     Component Value Date/Time   CHOL 164 03/25/2017 1102   TRIG 205 (H) 03/25/2017 1102   HDL 79 03/25/2017 1102   CHOLHDL 2.1 03/25/2017 1102   VLDL 41 (H) 03/25/2017 1102   LDLCALC 44 03/25/2017 1102   LDLDIRECT 152.4 03/09/2012 0900      Wt Readings from Last 3 Encounters:  03/04/18 157 lb (  71.2 kg)  02/16/18 159 lb (72.1 kg)  12/30/17 159 lb (72.1 kg)    ASSESSMENT AND PLAN: 1.  Coronary artery disease, native vessel, with angina: The patient's symptoms are currently well controlled on amlodipine and metoprolol.  She is tolerating antiplatelet therapy with aspirin and clopidogrel long-term after multivessel stenting.  She is on a low-dose of a statin medication with lipids at goal.  I will see her back in 6 months.  2.  Hypertension: Blood pressure is well controlled on current regimen.  I reviewed her medications today and no changes are recommended (hydrochlorothiazide, metoprolol, amlodipine).  3.  Hyperlipidemia: Most recent lipids are reviewed with an LDL cholesterol of 44, HDL 79, and total cholesterol 164.  Transaminases have been within expected limits.  Current medicines are reviewed with the patient today.  The patient does not have concerns regarding medicines.  Labs/ tests ordered today include:   Orders Placed This Encounter  Procedures  . EKG 12-Lead    Disposition:   FU 6 months  Signed, [name], MD  03/06/2018 11:37 AM     [facility] [address], [place], [place]  [address] Phone: [phone]; Fax: [phone]

## 2018-03-04 NOTE — Patient Instructions (Signed)

## 2018-03-06 ENCOUNTER — Encounter: Payer: Self-pay | Admitting: Cardiovascular Disease

## 2018-03-07 ENCOUNTER — Encounter (HOSPITAL_COMMUNITY): Payer: Self-pay

## 2018-03-09 ENCOUNTER — Encounter (HOSPITAL_COMMUNITY): Payer: Self-pay

## 2018-03-09 ENCOUNTER — Ambulatory Visit
Admission: RE | Admit: 2018-03-09 | Discharge: 2018-03-09 | Disposition: A | Discharge status: Home / Self Care | Payer: Medicare Other | Source: Ambulatory Visit | Attending: Neurosurgery | Admitting: Neurosurgery

## 2018-03-09 DIAGNOSIS — M48061 Spinal stenosis, lumbar region without neurogenic claudication: Secondary | ICD-10-CM

## 2018-03-10 ENCOUNTER — Telehealth: Payer: Self-pay | Admitting: Internal Medicine

## 2018-03-10 MED ORDER — DESVENLAFAXINE SUCCINATE ER 50 MG PO TB24: 50.0000 mg | ORAL_TABLET | Freq: Every day | ORAL | 3 refills | Status: DC

## 2018-03-10 NOTE — Telephone Encounter (Signed)
Patient is requesting a refill on her Pristiq 50mg .  States that she was on brand name only; however her insurance no longer has this on their formulary.  She spoke with her pharmacist and is willing to try the generic.  So, she is requesting a refill please.    Pharmacy:  [facility] at [address]  Phone:  [phone]  Thank you.

## 2018-03-10 NOTE — Telephone Encounter (Signed)
Refill Pristiq x one year

## 2018-03-11 ENCOUNTER — Encounter (HOSPITAL_COMMUNITY): Payer: Self-pay

## 2018-03-14 ENCOUNTER — Encounter (HOSPITAL_COMMUNITY): Payer: Self-pay

## 2018-03-14 DIAGNOSIS — I10 Essential (primary) hypertension: Secondary | ICD-10-CM | POA: Diagnosis not present

## 2018-03-14 DIAGNOSIS — Z6829 Body mass index (BMI) 29.0-29.9, adult: Secondary | ICD-10-CM | POA: Diagnosis not present

## 2018-03-14 DIAGNOSIS — M8580 Other specified disorders of bone density and structure, unspecified site: Secondary | ICD-10-CM | POA: Diagnosis not present

## 2018-03-14 DIAGNOSIS — S32030D Wedge compression fracture of third lumbar vertebra, subsequent encounter for fracture with routine healing: Secondary | ICD-10-CM | POA: Diagnosis not present

## 2018-03-14 DIAGNOSIS — M48061 Spinal stenosis, lumbar region without neurogenic claudication: Secondary | ICD-10-CM | POA: Diagnosis not present

## 2018-03-16 ENCOUNTER — Encounter (HOSPITAL_COMMUNITY): Payer: Self-pay

## 2018-03-17 ENCOUNTER — Other Ambulatory Visit: Payer: Self-pay | Admitting: Cardiovascular Disease

## 2018-03-18 ENCOUNTER — Encounter (HOSPITAL_COMMUNITY): Payer: Self-pay

## 2018-03-21 ENCOUNTER — Encounter (HOSPITAL_COMMUNITY): Payer: Self-pay | Attending: Cardiovascular Disease

## 2018-03-21 DIAGNOSIS — I2510 Atherosclerotic heart disease of native coronary artery without angina pectoris: Secondary | ICD-10-CM | POA: Insufficient documentation

## 2018-03-23 ENCOUNTER — Encounter (HOSPITAL_COMMUNITY): Payer: Self-pay

## 2018-03-23 ENCOUNTER — Telehealth (HOSPITAL_COMMUNITY): Payer: Self-pay | Admitting: *Deleted

## 2018-03-25 ENCOUNTER — Encounter (HOSPITAL_COMMUNITY): Payer: Self-pay

## 2018-03-28 ENCOUNTER — Encounter (HOSPITAL_COMMUNITY): Payer: Self-pay

## 2018-03-30 ENCOUNTER — Encounter (HOSPITAL_COMMUNITY): Payer: Self-pay

## 2018-04-01 ENCOUNTER — Encounter (HOSPITAL_COMMUNITY): Payer: Self-pay

## 2018-04-04 ENCOUNTER — Encounter (HOSPITAL_COMMUNITY): Payer: Self-pay

## 2018-04-06 ENCOUNTER — Encounter (HOSPITAL_COMMUNITY): Payer: Self-pay

## 2018-04-07 ENCOUNTER — Other Ambulatory Visit: Payer: Self-pay | Admitting: Cardiovascular Disease

## 2018-04-08 ENCOUNTER — Encounter (HOSPITAL_COMMUNITY): Payer: Self-pay

## 2018-04-11 ENCOUNTER — Encounter (HOSPITAL_COMMUNITY): Payer: Self-pay

## 2018-04-13 ENCOUNTER — Encounter (HOSPITAL_COMMUNITY): Payer: Self-pay

## 2018-04-15 ENCOUNTER — Encounter (HOSPITAL_COMMUNITY): Payer: Self-pay

## 2018-04-18 ENCOUNTER — Encounter (HOSPITAL_COMMUNITY): Payer: Self-pay

## 2018-04-20 ENCOUNTER — Encounter (HOSPITAL_COMMUNITY): Payer: Self-pay

## 2018-04-20 DIAGNOSIS — I2510 Atherosclerotic heart disease of native coronary artery without angina pectoris: Secondary | ICD-10-CM | POA: Insufficient documentation

## 2018-04-22 ENCOUNTER — Encounter (HOSPITAL_COMMUNITY): Payer: Self-pay

## 2018-04-25 ENCOUNTER — Encounter (HOSPITAL_COMMUNITY): Payer: Self-pay

## 2018-04-27 ENCOUNTER — Encounter (HOSPITAL_COMMUNITY): Payer: Self-pay

## 2018-04-29 ENCOUNTER — Encounter (HOSPITAL_COMMUNITY): Payer: Self-pay

## 2018-05-02 ENCOUNTER — Encounter (HOSPITAL_COMMUNITY): Payer: Self-pay

## 2018-05-02 DIAGNOSIS — S32030D Wedge compression fracture of third lumbar vertebra, subsequent encounter for fracture with routine healing: Secondary | ICD-10-CM | POA: Diagnosis not present

## 2018-05-02 DIAGNOSIS — M5416 Radiculopathy, lumbar region: Secondary | ICD-10-CM | POA: Diagnosis not present

## 2018-05-02 DIAGNOSIS — M545 Low back pain: Secondary | ICD-10-CM | POA: Diagnosis not present

## 2018-05-02 DIAGNOSIS — M48061 Spinal stenosis, lumbar region without neurogenic claudication: Secondary | ICD-10-CM | POA: Diagnosis not present

## 2018-05-04 ENCOUNTER — Encounter (HOSPITAL_COMMUNITY): Payer: Self-pay

## 2018-05-06 ENCOUNTER — Encounter (HOSPITAL_COMMUNITY): Payer: Self-pay

## 2018-05-09 ENCOUNTER — Encounter (HOSPITAL_COMMUNITY): Payer: Self-pay

## 2018-05-11 ENCOUNTER — Encounter (HOSPITAL_COMMUNITY)
Admission: RE | Admit: 2018-05-11 | Discharge: 2018-05-11 | Disposition: A | Discharge status: Home / Self Care | Payer: Self-pay | Source: Ambulatory Visit | Attending: Cardiovascular Disease | Admitting: Cardiovascular Disease

## 2018-05-13 ENCOUNTER — Encounter (HOSPITAL_COMMUNITY)
Admission: RE | Admit: 2018-05-13 | Discharge: 2018-05-13 | Disposition: A | Discharge status: Home / Self Care | Payer: Self-pay | Source: Ambulatory Visit | Attending: Cardiovascular Disease | Admitting: Cardiovascular Disease

## 2018-05-18 ENCOUNTER — Encounter (HOSPITAL_COMMUNITY)
Admission: RE | Admit: 2018-05-18 | Discharge: 2018-05-18 | Disposition: A | Discharge status: Home / Self Care | Payer: Medicare Other | Source: Ambulatory Visit | Attending: Cardiovascular Disease | Admitting: Cardiovascular Disease

## 2018-05-20 ENCOUNTER — Encounter (HOSPITAL_COMMUNITY): Payer: Self-pay

## 2018-05-23 ENCOUNTER — Encounter (HOSPITAL_COMMUNITY): Payer: Self-pay

## 2018-05-23 DIAGNOSIS — I2510 Atherosclerotic heart disease of native coronary artery without angina pectoris: Secondary | ICD-10-CM | POA: Insufficient documentation

## 2018-05-25 ENCOUNTER — Encounter (HOSPITAL_COMMUNITY): Payer: Self-pay

## 2018-05-27 ENCOUNTER — Encounter (HOSPITAL_COMMUNITY)
Admission: RE | Admit: 2018-05-27 | Discharge: 2018-05-27 | Disposition: A | Discharge status: Home / Self Care | Payer: Self-pay | Source: Ambulatory Visit | Attending: Cardiovascular Disease | Admitting: Cardiovascular Disease

## 2018-05-30 ENCOUNTER — Encounter (HOSPITAL_COMMUNITY)
Admission: RE | Admit: 2018-05-30 | Discharge: 2018-05-30 | Disposition: A | Discharge status: Home / Self Care | Payer: Self-pay | Source: Ambulatory Visit | Attending: Cardiovascular Disease | Admitting: Cardiovascular Disease

## 2018-05-30 DIAGNOSIS — M545 Low back pain: Secondary | ICD-10-CM | POA: Diagnosis not present

## 2018-05-30 DIAGNOSIS — I10 Essential (primary) hypertension: Secondary | ICD-10-CM | POA: Diagnosis not present

## 2018-05-30 DIAGNOSIS — M5416 Radiculopathy, lumbar region: Secondary | ICD-10-CM | POA: Diagnosis not present

## 2018-05-30 DIAGNOSIS — S32030D Wedge compression fracture of third lumbar vertebra, subsequent encounter for fracture with routine healing: Secondary | ICD-10-CM | POA: Diagnosis not present

## 2018-05-30 DIAGNOSIS — Z6829 Body mass index (BMI) 29.0-29.9, adult: Secondary | ICD-10-CM | POA: Diagnosis not present

## 2018-06-01 ENCOUNTER — Encounter (HOSPITAL_COMMUNITY): Payer: Self-pay

## 2018-06-03 ENCOUNTER — Encounter (HOSPITAL_COMMUNITY)
Admission: RE | Admit: 2018-06-03 | Discharge: 2018-06-03 | Disposition: A | Discharge status: Home / Self Care | Payer: Self-pay | Source: Ambulatory Visit | Attending: Cardiovascular Disease | Admitting: Cardiovascular Disease

## 2018-06-06 ENCOUNTER — Encounter (HOSPITAL_COMMUNITY)
Admission: RE | Admit: 2018-06-06 | Discharge: 2018-06-06 | Disposition: A | Discharge status: Home / Self Care | Payer: Self-pay | Source: Ambulatory Visit | Attending: Cardiovascular Disease | Admitting: Cardiovascular Disease

## 2018-06-08 ENCOUNTER — Encounter (HOSPITAL_COMMUNITY): Payer: Self-pay

## 2018-06-09 DIAGNOSIS — M5416 Radiculopathy, lumbar region: Secondary | ICD-10-CM | POA: Diagnosis not present

## 2018-06-10 ENCOUNTER — Encounter (HOSPITAL_COMMUNITY): Payer: Self-pay

## 2018-06-10 ENCOUNTER — Other Ambulatory Visit: Payer: Self-pay | Admitting: Internal Medicine

## 2018-06-10 NOTE — Telephone Encounter (Signed)
Verbal order by Dr. Renold Genta to fill Amlodipine 5mg  #90.  Sending to Araceli to escribe.

## 2018-06-13 ENCOUNTER — Encounter (HOSPITAL_COMMUNITY): Payer: Self-pay

## 2018-06-15 ENCOUNTER — Encounter (HOSPITAL_COMMUNITY): Payer: Self-pay

## 2018-06-17 ENCOUNTER — Encounter (HOSPITAL_COMMUNITY): Payer: Self-pay

## 2018-06-20 ENCOUNTER — Encounter (HOSPITAL_COMMUNITY): Payer: Self-pay

## 2018-06-20 DIAGNOSIS — I2510 Atherosclerotic heart disease of native coronary artery without angina pectoris: Secondary | ICD-10-CM | POA: Insufficient documentation

## 2018-06-22 ENCOUNTER — Encounter (HOSPITAL_COMMUNITY): Payer: Self-pay

## 2018-06-24 ENCOUNTER — Encounter (HOSPITAL_COMMUNITY): Payer: Self-pay

## 2018-06-27 ENCOUNTER — Encounter (HOSPITAL_COMMUNITY)
Admission: RE | Admit: 2018-06-27 | Discharge: 2018-06-27 | Disposition: A | Discharge status: Home / Self Care | Payer: Self-pay | Source: Ambulatory Visit | Attending: Cardiovascular Disease | Admitting: Cardiovascular Disease

## 2018-06-29 ENCOUNTER — Encounter (HOSPITAL_COMMUNITY)
Admission: RE | Admit: 2018-06-29 | Discharge: 2018-06-29 | Disposition: A | Discharge status: Home / Self Care | Payer: Self-pay | Source: Ambulatory Visit | Attending: Cardiovascular Disease | Admitting: Cardiovascular Disease

## 2018-07-01 ENCOUNTER — Encounter (HOSPITAL_COMMUNITY): Payer: Self-pay

## 2018-07-04 ENCOUNTER — Encounter (HOSPITAL_COMMUNITY)
Admission: RE | Admit: 2018-07-04 | Discharge: 2018-07-04 | Disposition: A | Discharge status: Home / Self Care | Payer: Medicare Other | Source: Ambulatory Visit | Attending: Cardiovascular Disease | Admitting: Cardiovascular Disease

## 2018-07-06 ENCOUNTER — Encounter (HOSPITAL_COMMUNITY)
Admission: RE | Admit: 2018-07-06 | Discharge: 2018-07-06 | Disposition: A | Discharge status: Home / Self Care | Payer: Self-pay | Source: Ambulatory Visit | Attending: Cardiovascular Disease | Admitting: Cardiovascular Disease

## 2018-07-08 ENCOUNTER — Encounter (HOSPITAL_COMMUNITY)
Admission: RE | Admit: 2018-07-08 | Discharge: 2018-07-08 | Disposition: A | Discharge status: Home / Self Care | Payer: Self-pay | Source: Ambulatory Visit | Attending: Cardiovascular Disease | Admitting: Cardiovascular Disease

## 2018-07-11 ENCOUNTER — Encounter (HOSPITAL_COMMUNITY)
Admission: RE | Admit: 2018-07-11 | Discharge: 2018-07-11 | Disposition: A | Discharge status: Home / Self Care | Payer: Medicare Other | Source: Ambulatory Visit | Attending: Cardiovascular Disease | Admitting: Cardiovascular Disease

## 2018-07-11 DIAGNOSIS — M545 Low back pain: Secondary | ICD-10-CM | POA: Diagnosis not present

## 2018-07-11 DIAGNOSIS — M5416 Radiculopathy, lumbar region: Secondary | ICD-10-CM | POA: Diagnosis not present

## 2018-07-11 DIAGNOSIS — Z6829 Body mass index (BMI) 29.0-29.9, adult: Secondary | ICD-10-CM | POA: Diagnosis not present

## 2018-07-11 DIAGNOSIS — I10 Essential (primary) hypertension: Secondary | ICD-10-CM | POA: Diagnosis not present

## 2018-07-12 ENCOUNTER — Telehealth: Payer: Self-pay | Admitting: Cardiovascular Disease

## 2018-07-12 ENCOUNTER — Other Ambulatory Visit: Payer: Self-pay | Admitting: Internal Medicine

## 2018-07-12 NOTE — Telephone Encounter (Signed)
Cannot refill until CPE is booked.

## 2018-07-12 NOTE — Telephone Encounter (Signed)
CPE is booked for 8/20 for her.

## 2018-07-12 NOTE — Telephone Encounter (Signed)
Reeves Forth (a Chief Executive Officer) called to inquire if Dr. Burt Knack (Ms. Crossett's current cardiologist) or Dr. Lia Foyer (Ms. 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 Informed her she will be called back with procedure information for this.  Called Risk [employer] and informed them of Camilla's request. They will call her and discuss her options. Supplied Camilla's phone number.

## 2018-07-12 NOTE — Telephone Encounter (Signed)
New Message   Pt's Daughter is calling, states she is a Chief Executive Officer and she needs to speak with the nurse

## 2018-07-13 ENCOUNTER — Encounter (HOSPITAL_COMMUNITY)
Admission: RE | Admit: 2018-07-13 | Discharge: 2018-07-13 | Disposition: A | Discharge status: Home / Self Care | Payer: Self-pay | Source: Ambulatory Visit | Attending: Cardiovascular Disease | Admitting: Cardiovascular Disease

## 2018-07-13 NOTE — Telephone Encounter (Signed)
Ok to fill her Xanax now since we have her CPE booked.  Can you fill for her please?    Thanks so much.

## 2018-07-15 ENCOUNTER — Encounter (HOSPITAL_COMMUNITY): Payer: Self-pay

## 2018-07-18 ENCOUNTER — Encounter (HOSPITAL_COMMUNITY): Payer: Self-pay

## 2018-07-19 ENCOUNTER — Telehealth: Payer: Self-pay | Admitting: Internal Medicine

## 2018-07-19 NOTE — Telephone Encounter (Signed)
Coming for CPE Labs on 8/15.  Wants to know if you will draw for Multiple Myeloma labs at that time as well per her recent visits with Dr. Vertell Limber.  She said you told her in past visits that she should've asked prior to her visits, so she's calling to ask prior to her visit this time.  Do you want to add this to her labs?  Or, do you want to discuss this with her during her CPE?

## 2018-07-19 NOTE — Telephone Encounter (Signed)
Have added this to her CPE Labs on 8/15.

## 2018-07-19 NOTE — Telephone Encounter (Signed)
Just draw SPEP with labs

## 2018-07-20 ENCOUNTER — Encounter (HOSPITAL_COMMUNITY)
Admission: RE | Admit: 2018-07-20 | Discharge: 2018-07-20 | Disposition: A | Discharge status: Home / Self Care | Payer: Self-pay | Source: Ambulatory Visit | Attending: Cardiovascular Disease | Admitting: Cardiovascular Disease

## 2018-07-20 ENCOUNTER — Other Ambulatory Visit: Payer: Self-pay

## 2018-07-22 ENCOUNTER — Encounter (HOSPITAL_COMMUNITY): Payer: Self-pay

## 2018-07-22 DIAGNOSIS — I2510 Atherosclerotic heart disease of native coronary artery without angina pectoris: Secondary | ICD-10-CM | POA: Insufficient documentation

## 2018-07-25 ENCOUNTER — Encounter (HOSPITAL_COMMUNITY)
Admission: RE | Admit: 2018-07-25 | Discharge: 2018-07-25 | Disposition: A | Discharge status: Home / Self Care | Payer: Medicare Other | Source: Ambulatory Visit | Attending: Cardiovascular Disease | Admitting: Cardiovascular Disease

## 2018-07-27 ENCOUNTER — Other Ambulatory Visit: Payer: Self-pay | Admitting: Internal Medicine

## 2018-07-27 ENCOUNTER — Encounter (HOSPITAL_COMMUNITY)
Admission: RE | Admit: 2018-07-27 | Discharge: 2018-07-27 | Disposition: A | Discharge status: Home / Self Care | Payer: Self-pay | Source: Ambulatory Visit | Attending: Cardiovascular Disease | Admitting: Cardiovascular Disease

## 2018-07-27 DIAGNOSIS — F329 Major depressive disorder, single episode, unspecified: Secondary | ICD-10-CM

## 2018-07-27 DIAGNOSIS — Z0000 Encounter for general adult medical examination without abnormal findings: Secondary | ICD-10-CM

## 2018-07-27 DIAGNOSIS — F419 Anxiety disorder, unspecified: Secondary | ICD-10-CM

## 2018-07-27 DIAGNOSIS — M48061 Spinal stenosis, lumbar region without neurogenic claudication: Secondary | ICD-10-CM

## 2018-07-27 DIAGNOSIS — I10 Essential (primary) hypertension: Secondary | ICD-10-CM

## 2018-07-27 DIAGNOSIS — G4709 Other insomnia: Secondary | ICD-10-CM

## 2018-07-27 DIAGNOSIS — R7302 Impaired glucose tolerance (oral): Secondary | ICD-10-CM

## 2018-07-27 DIAGNOSIS — K219 Gastro-esophageal reflux disease without esophagitis: Secondary | ICD-10-CM

## 2018-07-27 DIAGNOSIS — I252 Old myocardial infarction: Secondary | ICD-10-CM

## 2018-07-27 DIAGNOSIS — Z8679 Personal history of other diseases of the circulatory system: Secondary | ICD-10-CM

## 2018-07-27 DIAGNOSIS — E7849 Other hyperlipidemia: Secondary | ICD-10-CM

## 2018-07-27 DIAGNOSIS — G4730 Sleep apnea, unspecified: Secondary | ICD-10-CM

## 2018-07-27 DIAGNOSIS — F32A Depression, unspecified: Secondary | ICD-10-CM

## 2018-07-29 ENCOUNTER — Encounter (HOSPITAL_COMMUNITY)
Admission: RE | Admit: 2018-07-29 | Discharge: 2018-07-29 | Disposition: A | Discharge status: Home / Self Care | Payer: Self-pay | Source: Ambulatory Visit | Attending: Cardiovascular Disease | Admitting: Cardiovascular Disease

## 2018-08-01 ENCOUNTER — Encounter (HOSPITAL_COMMUNITY)
Admission: RE | Admit: 2018-08-01 | Discharge: 2018-08-01 | Disposition: A | Discharge status: Home / Self Care | Payer: Self-pay | Source: Ambulatory Visit | Attending: Cardiovascular Disease | Admitting: Cardiovascular Disease

## 2018-08-03 ENCOUNTER — Encounter (HOSPITAL_COMMUNITY)
Admission: RE | Admit: 2018-08-03 | Discharge: 2018-08-03 | Disposition: A | Discharge status: Home / Self Care | Payer: Self-pay | Source: Ambulatory Visit | Attending: Cardiovascular Disease | Admitting: Cardiovascular Disease

## 2018-08-04 ENCOUNTER — Other Ambulatory Visit: Payer: Medicare Other | Admitting: Internal Medicine

## 2018-08-04 DIAGNOSIS — M48061 Spinal stenosis, lumbar region without neurogenic claudication: Secondary | ICD-10-CM

## 2018-08-04 DIAGNOSIS — F419 Anxiety disorder, unspecified: Secondary | ICD-10-CM

## 2018-08-04 DIAGNOSIS — K219 Gastro-esophageal reflux disease without esophagitis: Secondary | ICD-10-CM | POA: Diagnosis not present

## 2018-08-04 DIAGNOSIS — I252 Old myocardial infarction: Secondary | ICD-10-CM

## 2018-08-04 DIAGNOSIS — E7849 Other hyperlipidemia: Secondary | ICD-10-CM

## 2018-08-04 DIAGNOSIS — Z8679 Personal history of other diseases of the circulatory system: Secondary | ICD-10-CM

## 2018-08-04 DIAGNOSIS — G4730 Sleep apnea, unspecified: Secondary | ICD-10-CM | POA: Diagnosis not present

## 2018-08-04 DIAGNOSIS — R7302 Impaired glucose tolerance (oral): Secondary | ICD-10-CM | POA: Diagnosis not present

## 2018-08-04 DIAGNOSIS — G4709 Other insomnia: Secondary | ICD-10-CM | POA: Diagnosis not present

## 2018-08-04 DIAGNOSIS — Z0000 Encounter for general adult medical examination without abnormal findings: Secondary | ICD-10-CM

## 2018-08-04 DIAGNOSIS — F32A Depression, unspecified: Secondary | ICD-10-CM

## 2018-08-04 DIAGNOSIS — F329 Major depressive disorder, single episode, unspecified: Secondary | ICD-10-CM

## 2018-08-04 DIAGNOSIS — I10 Essential (primary) hypertension: Secondary | ICD-10-CM

## 2018-08-05 ENCOUNTER — Encounter (HOSPITAL_COMMUNITY): Payer: Self-pay

## 2018-08-05 ENCOUNTER — Other Ambulatory Visit: Payer: Self-pay | Admitting: Cardiovascular Disease

## 2018-08-08 ENCOUNTER — Encounter (HOSPITAL_COMMUNITY)
Admission: RE | Admit: 2018-08-08 | Discharge: 2018-08-08 | Disposition: A | Discharge status: Home / Self Care | Payer: Self-pay | Source: Ambulatory Visit | Attending: Cardiovascular Disease | Admitting: Cardiovascular Disease

## 2018-08-08 LAB — PROTEIN ELECTROPHORESIS, SERUM
ALPHA 1: 0.4 g/dL — AB (ref 0.2–0.3)
Albumin ELP: 4.2 g/dL (ref 3.8–4.8)
Alpha 2: 0.7 g/dL (ref 0.5–0.9)
BETA GLOBULIN: 0.5 g/dL (ref 0.4–0.6)
Beta 2: 0.3 g/dL (ref 0.2–0.5)
GAMMA GLOBULIN: 0.6 g/dL — AB (ref 0.8–1.7)
Total Protein: 6.7 g/dL (ref 6.1–8.1)

## 2018-08-08 LAB — COMPLETE METABOLIC PANEL WITH GFR
AG Ratio: 1.7 (calc) (ref 1.0–2.5)
ALT: 13 U/L (ref 6–29)
AST: 18 U/L (ref 10–35)
Albumin: 4.1 g/dL (ref 3.6–5.1)
Alkaline phosphatase (APISO): 64 U/L (ref 33–130)
BILIRUBIN TOTAL: 0.4 mg/dL (ref 0.2–1.2)
BUN: 9 mg/dL (ref 7–25)
CALCIUM: 9.8 mg/dL (ref 8.6–10.4)
CHLORIDE: 103 mmol/L (ref 98–110)
CO2: 36 mmol/L — AB (ref 20–32)
Creat: 0.67 mg/dL (ref 0.60–0.93)
GFR, EST AFRICAN AMERICAN: 98 mL/min/{1.73_m2} (ref >=60)
GFR, EST NON AFRICAN AMERICAN: 85 mL/min/{1.73_m2} (ref >=60)
GLUCOSE: 122 mg/dL — AB (ref 65–99)
Globulin: 2.4 g/dL (calc) (ref 1.9–3.7)
Potassium: 4.5 mmol/L (ref 3.5–5.3)
Sodium: 148 mmol/L — ABNORMAL HIGH (ref 135–146)
TOTAL PROTEIN: 6.5 g/dL (ref 6.1–8.1)

## 2018-08-08 LAB — CBC WITH DIFFERENTIAL/PLATELET
BASOS ABS: 30 {cells}/uL (ref 0–200)
Basophils Relative: 0.6 %
EOS PCT: 1 %
Eosinophils Absolute: 50 cells/uL (ref 15–500)
HEMATOCRIT: 41.1 % (ref 35.0–45.0)
HEMOGLOBIN: 14 g/dL (ref 11.7–15.5)
LYMPHS ABS: 1255 {cells}/uL (ref 850–3900)
MCH: 32.3 pg (ref 27.0–33.0)
MCHC: 34.1 g/dL (ref 32.0–36.0)
MCV: 94.7 fL (ref 80.0–100.0)
MPV: 10.2 fL (ref 7.5–12.5)
Monocytes Relative: 7.6 %
NEUTROS ABS: 3285 {cells}/uL (ref 1500–7800)
Neutrophils Relative %: 65.7 %
Platelets: 159 10*3/uL (ref 140–400)
RBC: 4.34 10*6/uL (ref 3.80–5.10)
RDW: 12.1 % (ref 11.0–15.0)
Total Lymphocyte: 25.1 %
WBC mixed population: 380 cells/uL (ref 200–950)
WBC: 5 10*3/uL (ref 3.8–10.8)

## 2018-08-08 LAB — LIPID PANEL
Cholesterol: 175 mg/dL (ref <200)
HDL: 72 mg/dL (ref >50)
LDL Cholesterol (Calc): 74 mg/dL (calc)
NON-HDL CHOLESTEROL (CALC): 103 mg/dL (ref <130)
Total CHOL/HDL Ratio: 2.4 (calc) (ref <5.0)
Triglycerides: 193 mg/dL — ABNORMAL HIGH (ref <150)

## 2018-08-08 LAB — HEMOGLOBIN A1C
EAG (MMOL/L): 7 (calc)
HEMOGLOBIN A1C: 6 %{Hb} — AB (ref <5.7)
Mean Plasma Glucose: 126 (calc)

## 2018-08-08 LAB — MICROALBUMIN / CREATININE URINE RATIO
Creatinine, Urine: 135 mg/dL (ref 20–275)
MICROALB UR: 0.6 mg/dL
MICROALB/CREAT RATIO: 4 ug/mg{creat} (ref <30)

## 2018-08-08 LAB — TSH: TSH: 1.68 mIU/L (ref 0.40–4.50)

## 2018-08-09 ENCOUNTER — Encounter: Payer: Self-pay | Admitting: Internal Medicine

## 2018-08-09 ENCOUNTER — Ambulatory Visit (INDEPENDENT_AMBULATORY_CARE_PROVIDER_SITE_OTHER): Payer: Medicare Other | Admitting: Internal Medicine

## 2018-08-09 VITALS — BP 140/82 | HR 88 | Ht 60.0 in | Wt 157.0 lb

## 2018-08-09 DIAGNOSIS — M48062 Spinal stenosis, lumbar region with neurogenic claudication: Secondary | ICD-10-CM | POA: Diagnosis not present

## 2018-08-09 DIAGNOSIS — I10 Essential (primary) hypertension: Secondary | ICD-10-CM

## 2018-08-09 DIAGNOSIS — F32A Depression, unspecified: Secondary | ICD-10-CM

## 2018-08-09 DIAGNOSIS — Z0000 Encounter for general adult medical examination without abnormal findings: Secondary | ICD-10-CM | POA: Diagnosis not present

## 2018-08-09 DIAGNOSIS — J9611 Chronic respiratory failure with hypoxia: Secondary | ICD-10-CM | POA: Diagnosis not present

## 2018-08-09 DIAGNOSIS — F329 Major depressive disorder, single episode, unspecified: Secondary | ICD-10-CM | POA: Diagnosis not present

## 2018-08-09 DIAGNOSIS — F419 Anxiety disorder, unspecified: Secondary | ICD-10-CM

## 2018-08-09 DIAGNOSIS — I25119 Atherosclerotic heart disease of native coronary artery with unspecified angina pectoris: Secondary | ICD-10-CM | POA: Diagnosis not present

## 2018-08-09 DIAGNOSIS — G4733 Obstructive sleep apnea (adult) (pediatric): Secondary | ICD-10-CM | POA: Diagnosis not present

## 2018-08-09 DIAGNOSIS — R7302 Impaired glucose tolerance (oral): Secondary | ICD-10-CM | POA: Diagnosis not present

## 2018-08-09 DIAGNOSIS — K219 Gastro-esophageal reflux disease without esophagitis: Secondary | ICD-10-CM | POA: Diagnosis not present

## 2018-08-09 DIAGNOSIS — I252 Old myocardial infarction: Secondary | ICD-10-CM | POA: Diagnosis not present

## 2018-08-09 LAB — POCT URINALYSIS DIPSTICK
Appearance: NORMAL
BILIRUBIN UA: NEGATIVE
GLUCOSE UA: NEGATIVE
Ketones, UA: NEGATIVE
Leukocytes, UA: NEGATIVE
NITRITE UA: NEGATIVE
ODOR: NORMAL
Protein, UA: NEGATIVE
RBC UA: NEGATIVE
Spec Grav, UA: 1.015 (ref 1.010–1.025)
UROBILINOGEN UA: 0.2 U/dL
pH, UA: 6.5 (ref 5.0–8.0)

## 2018-08-09 NOTE — Progress Notes (Signed)
Subjective:    Patient ID: Carrie Chung, female    DOB: [birth year], 77 y.o.   MRN: [id number]  HPI 77 year old Female for health maintenance exam and evaluation of medical issues.  Her husband says her voice is changed and she wants to see Dr. Erik Obey.  Order given for Shingrix vaccine.  She goes to cardiac rehab status post non-ST elevation MI in April 2013.  While hospitalized she had bradycardia and ST elevation was noted.  She was taken to the Cath Lab on an urgent basis and was found to have a 99% blockage in the right coronary artery with thrombus.  A drug-eluting stent was placed.  2 days later she was taken back to the Cath Lab because 2 high-grade blockages one in the LAD and one in the left circumflex vessel also requiring PTCA and drug-eluting stent.  She has done remarkably well and is stable.  History of trigger finger seen by Dr. Fredna Dow and had therapy  History of essential hypertension.  History of anxiety depression hyperlipidemia insomnia and lumbar back pain.  History of obstructive sleep apnea and impaired glucose tolerance.  History of mild cognitive deficits followed by neurology.  Long-standing history of insomnia.  Dr. Brett Fairy follows her for sleep apnea and cognitive issues.  History of GE reflux  It brucellosis 1961.  Tetanus immunization 2013.  She is allergic to penicillin tetracycline and has had adverse reactions to Rozerem Ambien and trazodone.  Social history: She is married to a radiation oncology physician who works in [place] [employer].  She quit smoking over 20 years ago.  Social alcohol consumption  Family history: Mother died at age 62 with history of cardiovascular disease.  Father died at age 11 of cancer of the colon.  One daughter who is an attorney in good health expecting another grandchild.  One grandson.  2 sisters in good health.  Past medical history: Tonsillectomy and adenoidectomy, appendectomy, removal of Morton's neuroma.   Had herniated disc L3, L4, L5 in 1994.  Has seen Dr. Vertell Limber for back pain with radiculopathy and had epidural steroid injection with good relief.  Has had tear of right supraspinatus muscle September 2003.  Right knee meniscal tear 1975.  Fractured pelvis 2001.  Cholecystectomy 1979.  C-section 1984.  In March 2018 she had a bout of right-sided back pain radiating into her right buttock.  History of MRI 2014 of the LS spine showed central disc herniation L1-L2 indenting the thecal sac slightly.  At L3-L4 desiccation of disc and bulging disc.  She responded to prednisone and improved but had a fall in February 2019 resulting in closed compression fracture L3  MRI of the LS spine ordered by Dr. Vertell Limber in March 2019 showed extruded disc fragment L1-L2 in the midline extending cranially unchanged from prior study.  Moderate to advanced disc disease with multiple bulging disc and chronic fracture of T12.  Severe spinal stenosis L3-L4 and L4-L5.    Review of Systems husband thinks voice has changed. Referred to Dr. Erik Obey for evaluation.     Objective:   Physical Exam  Constitutional: She is oriented to person, place, and time. She appears well-developed and well-nourished.  HENT:  Head: Normocephalic and atraumatic.  Right Ear: External ear normal.  Left Ear: External ear normal.  Mouth/Throat: Oropharynx is clear and moist. No oropharyngeal exudate.  Eyes: Conjunctivae and EOM are normal. Right eye exhibits no discharge. Left eye exhibits no discharge.  Neck: Neck supple. No JVD present.  Cardiovascular: Normal rate, regular rhythm and normal heart sounds. Exam reveals no friction rub.  No murmur heard. Pulmonary/Chest: Effort normal and breath sounds normal. No stridor. No respiratory distress. She has no wheezes.  Abdominal: Soft. Bowel sounds are normal. She exhibits no distension and no mass. There is no tenderness. There is no rebound and no guarding.  Genitourinary:  Genitourinary  Comments: Deferred to Dr. Edwinna Areola  Musculoskeletal: She exhibits no edema.  Lymphadenopathy:    She has no cervical adenopathy.  Neurological: She is oriented to person, place, and time. She displays normal reflexes. No cranial nerve deficit or sensory deficit. She exhibits normal muscle tone. Coordination normal.  Skin: Skin is warm and dry.  Psychiatric: She has a normal mood and affect. Her behavior is normal. Judgment and thought content normal.  Vitals reviewed.         Assessment & Plan:   Coronary artery disease followed by Dr. Burt Knack  Lumbar disc disease with history of compression fracture followed by Dr. Vertell Limber  Insomnia treated with Xanax  Anxiety depression treated with Pristiq  History of sleep apnea treated with CPAP  Essential hypertension-stable  Hyperlipidemia  GE reflux  Obesity  Mild cognitive impairment followed by neurology  Impaired glucose tolerance  Plan:  Shingrix Rx given To get high dose Fluzone at pharmacy  Continue same medications and follow-up in 6 months.  ENT referral as requested by patient and her husband for voice changes  Subjective:   Patient presents for Medicare Annual/Subsequent preventive examination.  Review Past Medical/Family/Social: See above   Risk Factors  Current exercise habits: Goes to cardiac rehab Dietary issues discussed: Low-fat low carbohydrate  Cardiac risk factors: Hyperlipidemia, history of MI, essential hypertension, family history of mother  Depression Screen  (Note: if answer to either of the following is "Yes", a more complete depression screening is indicated)   Over the past two weeks, have you felt down, depressed or hopeless?  Yes-I think due to medical issues Over the past two weeks, have you felt little interest or pleasure in doing things? No Have you lost interest or pleasure in daily life? No Do you often feel hopeless? No Do you cry easily over simple problems? No    Activities of Daily Living  In your present state of health, do you have any difficulty performing the following activities?:   Driving? No  Managing money? No  Feeding yourself? No  Getting from bed to chair? No  Climbing a flight of stairs? No  Preparing food and eating?: No  Bathing or showering? No  Getting dressed: No  Getting to the toilet? No  Using the toilet:No  Moving around from place to place: No  In the past year have you fallen or had a near fall?  Yes resulting in compression fracture Are you sexually active? No  Do you have more than one partner? No   Hearing Difficulties: No  Do you often ask people to speak up or repeat themselves? No  Do you experience ringing or noises in your ears? No  Do you have difficulty understanding soft or whispered voices?  Yes Do you feel that you have a problem with memory? No Do you often misplace items? No    Home Safety:  Do you have a smoke alarm at your residence? Yes Do you have grab bars in the bathroom?  Yes Do you have throw rugs in your house?  No   Cognitive Testing  Alert? Yes Normal Appearance?Yes  Oriented to person? Yes Place? Yes  Time? Yes  Recall of three objects?  Not tested Can perform simple calculations?  Not tested Displays appropriate judgment?Yes  Can read the correct time from a watch face?Yes   List the Names of Other Physician/Practitioners you currently use:  See referral list for the physicians patient is currently seeing.     Review of Systems: See above   Objective:     General appearance: Appears stated age and mildly obese  Head: Normocephalic, without obvious abnormality, atraumatic  Eyes: conj clear, EOMi PEERLA  Ears: normal TM's and external ear canals both ears  Nose: Nares normal. Septum midline. Mucosa normal. No drainage or sinus tenderness.  Throat: lips, mucosa, and tongue normal; teeth and gums normal  Neck: no adenopathy, no carotid bruit, no JVD, supple,  symmetrical, trachea midline and thyroid not enlarged, symmetric, no tenderness/mass/nodules  No CVA tenderness.  Lungs: clear to auscultation bilaterally  Breasts: normal appearance, no masses or tenderness, top of the pacemaker on left upper chest. Incision well-healed. It is tender.  Heart: regular rate and rhythm, S1, S2 normal, no murmur, click, rub or gallop  Abdomen: soft, non-tender; bowel sounds normal; no masses, no organomegaly  Musculoskeletal: ROM normal in all joints, no crepitus, no deformity, Normal muscle strengthen. Back  is symmetric, no curvature. Skin: Skin color, texture, turgor normal. No rashes or lesions  Lymph nodes: Cervical, supraclavicular, and axillary nodes normal.  Neurologic: CN 2 -12 Normal, Normal symmetric reflexes. Normal coordination and gait  Psych: Alert & Oriented x 3, Mood appear stable.    Assessment:    Annual wellness medicare exam   Plan:    During the course of the visit the patient was educated and counseled about appropriate screening and preventive services including:        Patient Instructions (the written plan) was given to the patient.  Medicare Attestation  I have personally reviewed:  The patient's medical and social history  Their use of alcohol, tobacco or illicit drugs  Their current medications and supplements  The patient's functional ability including ADLs,fall risks, home safety risks, cognitive, and hearing and visual impairment  Diet and physical activities  Evidence for depression or mood disorders  The patient's weight, height, BMI, and visual acuity have been recorded in the chart. I have made referrals, counseling, and provided education to the patient based on review of the above and I have provided the patient with a written personalized care plan for preventive services.

## 2018-08-10 ENCOUNTER — Encounter (HOSPITAL_COMMUNITY)
Admission: RE | Admit: 2018-08-10 | Discharge: 2018-08-10 | Disposition: A | Discharge status: Home / Self Care | Payer: Self-pay | Source: Ambulatory Visit | Attending: Cardiovascular Disease | Admitting: Cardiovascular Disease

## 2018-08-12 ENCOUNTER — Encounter (HOSPITAL_COMMUNITY)
Admission: RE | Admit: 2018-08-12 | Discharge: 2018-08-12 | Disposition: A | Discharge status: Home / Self Care | Payer: Medicare Other | Source: Ambulatory Visit | Attending: Cardiovascular Disease | Admitting: Cardiovascular Disease

## 2018-08-15 ENCOUNTER — Encounter (HOSPITAL_COMMUNITY)
Admission: RE | Admit: 2018-08-15 | Discharge: 2018-08-15 | Disposition: A | Discharge status: Home / Self Care | Payer: Medicare Other | Source: Ambulatory Visit | Attending: Cardiovascular Disease | Admitting: Cardiovascular Disease

## 2018-08-17 ENCOUNTER — Encounter (HOSPITAL_COMMUNITY): Payer: Medicare Other

## 2018-08-19 ENCOUNTER — Encounter (HOSPITAL_COMMUNITY): Payer: Self-pay

## 2018-08-20 NOTE — Patient Instructions (Signed)
It was a pleasure to see you today.  Continue same medications.  Work on diet exercise and weight loss.  We will refer you to Dr. Erik Obey for your complaint of voice change.  Follow-up in 6 months.

## 2018-08-29 DIAGNOSIS — R499 Unspecified voice and resonance disorder: Secondary | ICD-10-CM | POA: Diagnosis not present

## 2018-08-29 DIAGNOSIS — R490 Dysphonia: Secondary | ICD-10-CM | POA: Diagnosis not present

## 2018-08-29 DIAGNOSIS — K219 Gastro-esophageal reflux disease without esophagitis: Secondary | ICD-10-CM | POA: Diagnosis not present

## 2018-08-31 ENCOUNTER — Encounter (HOSPITAL_COMMUNITY)
Admission: RE | Admit: 2018-08-31 | Discharge: 2018-08-31 | Disposition: A | Discharge status: Home / Self Care | Payer: Self-pay | Source: Ambulatory Visit | Attending: Cardiovascular Disease | Admitting: Cardiovascular Disease

## 2018-08-31 DIAGNOSIS — I2510 Atherosclerotic heart disease of native coronary artery without angina pectoris: Secondary | ICD-10-CM | POA: Insufficient documentation

## 2018-09-02 ENCOUNTER — Encounter (HOSPITAL_COMMUNITY)
Admission: RE | Admit: 2018-09-02 | Discharge: 2018-09-02 | Disposition: A | Discharge status: Home / Self Care | Payer: Self-pay | Source: Ambulatory Visit | Attending: Cardiovascular Disease | Admitting: Cardiovascular Disease

## 2018-09-05 DIAGNOSIS — I10 Essential (primary) hypertension: Secondary | ICD-10-CM | POA: Diagnosis not present

## 2018-09-05 DIAGNOSIS — S32030D Wedge compression fracture of third lumbar vertebra, subsequent encounter for fracture with routine healing: Secondary | ICD-10-CM | POA: Diagnosis not present

## 2018-09-05 DIAGNOSIS — Z6829 Body mass index (BMI) 29.0-29.9, adult: Secondary | ICD-10-CM | POA: Diagnosis not present

## 2018-09-05 DIAGNOSIS — M545 Low back pain: Secondary | ICD-10-CM | POA: Diagnosis not present

## 2018-09-05 DIAGNOSIS — M48061 Spinal stenosis, lumbar region without neurogenic claudication: Secondary | ICD-10-CM | POA: Diagnosis not present

## 2018-09-05 DIAGNOSIS — M25552 Pain in left hip: Secondary | ICD-10-CM | POA: Diagnosis not present

## 2018-09-05 DIAGNOSIS — M5416 Radiculopathy, lumbar region: Secondary | ICD-10-CM | POA: Diagnosis not present

## 2018-09-06 ENCOUNTER — Other Ambulatory Visit: Payer: Self-pay | Admitting: Internal Medicine

## 2018-09-07 ENCOUNTER — Other Ambulatory Visit: Payer: Self-pay | Admitting: Internal Medicine

## 2018-09-07 ENCOUNTER — Other Ambulatory Visit: Payer: Self-pay | Admitting: Cardiovascular Disease

## 2018-09-07 ENCOUNTER — Encounter (HOSPITAL_COMMUNITY)
Admission: RE | Admit: 2018-09-07 | Discharge: 2018-09-07 | Disposition: A | Discharge status: Home / Self Care | Payer: Self-pay | Source: Ambulatory Visit | Attending: Cardiovascular Disease | Admitting: Cardiovascular Disease

## 2018-09-08 ENCOUNTER — Telehealth: Payer: Self-pay | Admitting: Cardiovascular Disease

## 2018-09-08 DIAGNOSIS — Z23 Encounter for immunization: Secondary | ICD-10-CM | POA: Diagnosis not present

## 2018-09-08 NOTE — Telephone Encounter (Signed)
New message    Patient wants to know if she can be scheduled for November 15th 2019 per the conversation that she had with you on yesterday. Please call to discuss.

## 2018-09-08 NOTE — Telephone Encounter (Signed)
Carrie Chung has a grandchild due the week before her Oct 4 appointment. After much discussion, she wishes to keep her appointment on Oct 4. She was grateful for call back.

## 2018-09-09 ENCOUNTER — Telehealth: Payer: Self-pay

## 2018-09-09 ENCOUNTER — Encounter (HOSPITAL_COMMUNITY)
Admission: RE | Admit: 2018-09-09 | Discharge: 2018-09-09 | Disposition: A | Discharge status: Home / Self Care | Payer: Self-pay | Source: Ambulatory Visit | Attending: Cardiovascular Disease | Admitting: Cardiovascular Disease

## 2018-09-09 NOTE — Telephone Encounter (Addendum)
   [facility] Pre-operative Risk Assessment    Request for surgical clearance:  1. What type of surgery is being performed? ESI Lumbar   2. When is this surgery scheduled? Pending clearance   3. What type of clearance is required (medical clearance vs. Pharmacy clearance to hold med vs. Both)? BOTH  4. Are there any medications that need to be held prior to surgery and how long? Plavix for 7 days   5. Practice name and name of physician performing surgery? [facility], Dr. Clydell Hakim  6. What is your office phone number [phone]    7.   What is your office fax number        [phone]  8.   Anesthesia type (None, local, MAC, general) ? Unknown   Carrie Chung 09/09/2018, 2:32 PM  _________________________________________________________________   (provider comments below)

## 2018-09-11 NOTE — Telephone Encounter (Signed)
Pt at acceptable risk of holding plavix x 7 days. Ok to proceed. thanks

## 2018-09-12 ENCOUNTER — Encounter (HOSPITAL_COMMUNITY)
Admission: RE | Admit: 2018-09-12 | Discharge: 2018-09-12 | Disposition: A | Discharge status: Home / Self Care | Payer: Self-pay | Source: Ambulatory Visit | Attending: Cardiovascular Disease | Admitting: Cardiovascular Disease

## 2018-09-12 NOTE — Telephone Encounter (Signed)
   Primary Cardiologist: [name], MD  Chart reviewed as part of pre-operative protocol coverage. Dr. Burt Knack has reviewed the chart as below and stated, "Pt at acceptable risk of holding plavix x 7 days. Ok to proceed." I did speak with the patient to ensure no new cardiac symptoms and she affirms she is doing fine without angina or SOB. Based on ACC/AHA guidelines, Flor C Bazzano would be at acceptable risk for the planned procedure without further cardiovascular testing.   I will route this recommendation to the requesting party via Epic fax function and remove from pre-op pool.  Please call with questions.  Charlie Pitter, PA-C 09/12/2018, 2:12 PM

## 2018-09-21 ENCOUNTER — Encounter (HOSPITAL_COMMUNITY): Payer: Self-pay

## 2018-09-21 DIAGNOSIS — I2510 Atherosclerotic heart disease of native coronary artery without angina pectoris: Secondary | ICD-10-CM | POA: Insufficient documentation

## 2018-09-23 ENCOUNTER — Ambulatory Visit (INDEPENDENT_AMBULATORY_CARE_PROVIDER_SITE_OTHER): Payer: Medicare Other | Admitting: Cardiovascular Disease

## 2018-09-23 ENCOUNTER — Encounter: Payer: Self-pay | Admitting: Cardiovascular Disease

## 2018-09-23 ENCOUNTER — Encounter (HOSPITAL_COMMUNITY): Payer: Self-pay

## 2018-09-23 VITALS — BP 130/70 | HR 75 | Ht 60.0 in | Wt 160.8 lb

## 2018-09-23 DIAGNOSIS — I10 Essential (primary) hypertension: Secondary | ICD-10-CM

## 2018-09-23 DIAGNOSIS — E782 Mixed hyperlipidemia: Secondary | ICD-10-CM

## 2018-09-23 DIAGNOSIS — I25119 Atherosclerotic heart disease of native coronary artery with unspecified angina pectoris: Secondary | ICD-10-CM | POA: Diagnosis not present

## 2018-09-23 NOTE — Progress Notes (Signed)
Cardiology Office Note:    Date:  09/23/2018   ID:  Carrie Chung, DOB October 03, 1941, MRN [id number]  PCP:  [name], MD  Cardiologist:  [name], MD  Electrophysiologist:  None   Referring MD: [name], MD   Chief Complaint  Patient presents with  . Follow-up    CAD    History of Present Illness:    Carrie Chung is a 77 y.o. female with a hx of CAD and multivessel stenting, presenting for follow-up evaluation.  She underwent multivessel stenting in 2013 after presenting with a non-ST elevation MI. She was treated with stenting of the right coronary artery initially followed by staged PCI of the LAD and left circumflex. She was treated with drug-eluting stents. Her left ventricular function was normal with an EF of 60%.  The patient is here alone today. She disappointed with the fact that she's gained weight but has been sedentary because of back problems.  She has exertional dyspnea and attributes this to lack of exercise and weight gain.  She denies orthopnea or PND.  She is had no significant leg swelling.  She did have one episode of chest discomfort that felt like a sharp needlelike pain over the left chest that occurred with physical exertion.  This happened about 2 weeks ago and was a self-limited event.  Total pain duration was about 10 minutes.  She is had no other associated symptoms.  No recurrence of chest pain at rest or with activity.  Past Medical History:  Diagnosis Date  . Atrial tachycardia (HCC)    a. noted at cardiac rehab 5/13; event monitor ordered to assess for AFib  . CAD (coronary artery disease)    a. NSTEMI 03/2012 ([place] 03/27/12: pLAD 30%, mLAD 99%, mCFX 95%, mRCA 99% with thrombus, EF 65%);  s/p PTCA/DESx1 to prox-mid RCA 03/27/12 urgently in setting of hypotension/bradycardia, with staged PTCA/Evolve study stent to mid LAD & PTCA/Evolve study stent to prox LCx 03/29/12 ;   echo 03/28/12:EF 60%, Aortic sclerosis without AS, mild RVE, mild  reduced RVSF    . Endometrial polyp   . HTN (hypertension)   . Hyperglycemia   . Hyperlipidemia   . Hypokalemia   . MI, old   . Sleep apnea     Past Surgical History:  Procedure Laterality Date  . APPENDECTOMY  77 years old  . CAROTID STENT  04/2012   x3  . CESAREAN SECTION  1984  . CHOLECYSTECTOMY  1978  . LEFT HEART CATHETERIZATION WITH CORONARY ANGIOGRAM N/A 03/27/2012   Procedure: LEFT HEART CATHETERIZATION WITH CORONARY ANGIOGRAM;  Surgeon: Burnell Blanks, MD;  Location: [facility] CATH LAB;  Service: Cardiovascular;  Laterality: N/A;  . PERCUTANEOUS CORONARY STENT INTERVENTION (PCI-S) N/A 03/27/2012   Procedure: PERCUTANEOUS CORONARY STENT INTERVENTION (PCI-S);  Surgeon: Burnell Blanks, MD;  Location: [facility] CATH LAB;  Service: Cardiovascular;  Laterality: N/A;  . PERCUTANEOUS CORONARY STENT INTERVENTION (PCI-S) N/A 03/29/2012   Procedure: PERCUTANEOUS CORONARY STENT INTERVENTION (PCI-S);  Surgeon: [name], MD;  Location: [facility] CATH LAB;  Service: Cardiovascular;  Laterality: N/A;  . TONSILLECTOMY  77 years old    Current Medications: Current Meds  Medication Sig  . ALPRAZolam (XANAX) 1 MG tablet TAKE 1 TABLET BY MOUTH EVERY NIGHT AT BEDTIME AS NEEDED FOR SLEEP  . amLODipine (NORVASC) 5 MG tablet TAKE 1 TABLET BY MOUTH EVERY DAY  . aspirin 81 MG tablet Take 1 tablet (81 mg total) by mouth daily.  . Calcium  Carbonate-Vitamin D (CALTRATE 600+D) 600-400 MG-UNIT per tablet Take 1 tablet by mouth 2 (two) times daily.   . clopidogrel (PLAVIX) 75 MG tablet TAKE 1 TABLET(75 MG) BY MOUTH DAILY  . desvenlafaxine (PRISTIQ) 50 MG 24 hr tablet Take 1 tablet (50 mg total) by mouth daily.  Marland Kitchen esomeprazole (NEXIUM) 40 MG capsule Take 40 mg by mouth daily at 12 noon.  . folic acid-pyridoxine-cyancobalamin (FOLBIC) 2.5-25-2 MG TABS tablet Take 1 tablet daily by mouth.  . hydrochlorothiazide (HYDRODIURIL) 25 MG tablet TAKE 1 TABLET BY MOUTH EVERY DAY  . metoprolol tartrate (LOPRESSOR) 50 MG  tablet Take 1.5 tablets (75 mg total) by mouth 2 (two) times daily.  . nitroGLYCERIN (NITROSTAT) 0.4 MG SL tablet PLACE 1 TAB UNDER TONGUE EVERY 5 MINUTES FOR UP TO 3 DOSES AS NEEDED FOR CHEST PAIN  . NON FORMULARY Oxygen 2L at night  . rosuvastatin (CRESTOR) 10 MG tablet TAKE 1 TABLET BY MOUTH EVERY DAY  . VENTOLIN HFA 108 (90 Base) MCG/ACT inhaler INHALE 2 PUFFS INTO THE LUNGS EVERY 6 (SIX) HOURS AS NEEDED FOR WHEEZING OR SHORTNESS OF BREATH.     Allergies:   Penicillins; Sulfonamide derivatives; and Sulfamethoxazole   Social History   Socioeconomic History  . Marital status: Married    Spouse name: Not on file  . Number of children: 1  . Years of education: Coll +  . Highest education level: Not on file  Occupational History  . Occupation: Retired Ship broker  . Financial resource strain: Not on file  . Food insecurity:    Worry: Not on file    Inability: Not on file  . Transportation needs:    Medical: Not on file    Non-medical: Not on file  Tobacco Use  . Smoking status: Former Smoker    Types: Cigarettes    Last attempt to quit: 04/19/1980    Years since quitting: 38.4  . Smokeless tobacco: Never Used  Substance and Sexual Activity  . Alcohol use: Yes    Alcohol/week: 20.0 standard drinks    Types: 20 Glasses of wine per week    Comment: socially  . Drug use: No  . Sexual activity: Never  Lifestyle  . Physical activity:    Days per week: Not on file    Minutes per session: Not on file  . Stress: Not on file  Relationships  . Social connections:    Talks on phone: Not on file    Gets together: Not on file    Attends religious service: Not on file    Active member of club or organization: Not on file    Attends meetings of clubs or organizations: Not on file    Relationship status: Not on file  Other Topics Concern  . Not on file  Social History Narrative   Caffeine 2 cups tea.     Family History: The patient's family history includes Cancer -  Other in her father; Colon cancer in her father; Coronary artery disease in her mother.  ROS:   Please see the history of present illness.     All other systems reviewed and are negative.  EKGs/Labs/Other Studies Reviewed:    EKG:  EKG is not ordered today.   Recent Labs: 08/04/2018: ALT 13; BUN 9; Creat 0.67; Hemoglobin 14.0; Platelets 159; Potassium 4.5; Sodium 148; TSH 1.68  Recent Lipid Panel    Component Value Date/Time   CHOL 175 08/04/2018 0908   TRIG 193 (H) 08/04/2018 0908  HDL 72 08/04/2018 0908   CHOLHDL 2.4 08/04/2018 0908   VLDL 41 (H) 03/25/2017 1102   LDLCALC 74 08/04/2018 0908   LDLDIRECT 152.4 03/09/2012 0900    Physical Exam:    VS:  BP 130/70   Pulse 75   Ht 5' (1.524 m)   Wt 160 lb 12.8 oz (72.9 kg)   SpO2 96%   BMI 31.40 kg/m     Wt Readings from Last 3 Encounters:  09/23/18 160 lb 12.8 oz (72.9 kg)  08/09/18 157 lb (71.2 kg)  03/04/18 157 lb (71.2 kg)     GEN:  Well nourished, well developed in no acute distress HEENT: Normal NECK: No JVD; No carotid bruits LYMPHATICS: No lymphadenopathy CARDIAC: RRR, no murmurs, rubs, gallops RESPIRATORY:  Clear to auscultation without rales, wheezing or rhonchi  ABDOMEN: Soft, non-tender, non-distended MUSCULOSKELETAL:  No edema; No deformity  SKIN: Warm and dry NEUROLOGIC:  Alert and oriented x 3 PSYCHIATRIC:  Normal affect   ASSESSMENT:    1. Coronary artery disease involving native coronary artery of native heart with angina pectoris ([place])   2. Essential hypertension   3. Mixed hyperlipidemia    PLAN:    In order of problems listed above:  1. We will continue current management.  The patient did have an isolated episode of chest discomfort and I asked her to keep a close eye on her symptoms.  If she has recurrent chest pain at rest or with exertion she will contact us and we will proceed with further evaluation.  She feels like the episode was a "one off."  Antianginal program is reviewed and  includes amlodipine, aspirin, clopidogrel, metoprolol, and Crestor.  She has an upcoming spinal injection and will need to hold clopidogrel for 7 days.  This is reasonable as she is several years out from stenting. 2. Blood pressure is controlled on current medical program. 3. Lipids are reviewed and cholesterol is 175, HDL 72, LDL 74.  Continue Crestor at current dose.  Medication Adjustments/Labs and Tests Ordered: Current medicines are reviewed at length with the patient today.  Concerns regarding medicines are outlined above.  No orders of the defined types were placed in this encounter.  No orders of the defined types were placed in this encounter.   Patient Instructions  Medication Instructions:  Your provider recommends that you continue on your current medications as directed. Please refer to the Current Medication list given to you today.   If you need a refill on your cardiac medications before your next appointment, please call your pharmacy.   Lab work: None  Testing/Procedures: None  Follow-Up: At [facility], you and your health needs are our priority.  As part of our continuing mission to provide you with exceptional heart care, we have created designated Provider Care Teams.  These Care Teams include your primary Cardiologist (physician) and Advanced Practice Providers (APPs -  Physician Assistants and Nurse Practitioners) who all work together to provide you with the care you need, when you need it. You will need a follow up appointment in:  6 months.  Please call our office 2 months in advance to schedule this appointment.  You may see [name], MD or one of the following Advanced Practice Providers on your designated Care Team: Richardson Dopp, PA-C [place], [place] . Daune Perch, NP      Signed, [name], MD  09/23/2018 5:35 PM    [place]

## 2018-09-23 NOTE — Patient Instructions (Signed)
Medication Instructions:  Your provider recommends that you continue on your current medications as directed. Please refer to the Current Medication list given to you today.   If you need a refill on your cardiac medications before your next appointment, please call your pharmacy.   Lab work: None  Testing/Procedures: None  Follow-Up: At [facility], you and your health needs are our priority.  As part of our continuing mission to provide you with exceptional heart care, we have created designated Provider Care Teams.  These Care Teams include your primary Cardiologist (physician) and Advanced Practice Providers (APPs -  Physician Assistants and Nurse Practitioners) who all work together to provide you with the care you need, when you need it. You will need a follow up appointment in:  6 months.  Please call our office 2 months in advance to schedule this appointment.  You may see Michael Cooper, MD or one of the following Advanced Practice Providers on your designated Care Team: Scott Weaver, PA-C Vin Bhagat, PA-C . Janine Hammond, NP   

## 2018-09-26 ENCOUNTER — Encounter (HOSPITAL_COMMUNITY)
Admission: RE | Admit: 2018-09-26 | Discharge: 2018-09-26 | Disposition: A | Discharge status: Home / Self Care | Payer: Self-pay | Source: Ambulatory Visit | Attending: Cardiovascular Disease | Admitting: Cardiovascular Disease

## 2018-09-28 ENCOUNTER — Encounter (HOSPITAL_COMMUNITY)
Admission: RE | Admit: 2018-09-28 | Discharge: 2018-09-28 | Disposition: A | Discharge status: Home / Self Care | Payer: Self-pay | Source: Ambulatory Visit | Attending: Cardiovascular Disease | Admitting: Cardiovascular Disease

## 2018-09-30 ENCOUNTER — Encounter (HOSPITAL_COMMUNITY)
Admission: RE | Admit: 2018-09-30 | Discharge: 2018-09-30 | Disposition: A | Discharge status: Home / Self Care | Payer: Self-pay | Source: Ambulatory Visit | Attending: Cardiovascular Disease | Admitting: Cardiovascular Disease

## 2018-10-03 ENCOUNTER — Encounter (HOSPITAL_COMMUNITY)
Admission: RE | Admit: 2018-10-03 | Discharge: 2018-10-03 | Disposition: A | Discharge status: Home / Self Care | Payer: Self-pay | Source: Ambulatory Visit | Attending: Cardiovascular Disease | Admitting: Cardiovascular Disease

## 2018-10-05 ENCOUNTER — Encounter (HOSPITAL_COMMUNITY): Payer: Self-pay

## 2018-10-06 DIAGNOSIS — M48061 Spinal stenosis, lumbar region without neurogenic claudication: Secondary | ICD-10-CM | POA: Diagnosis not present

## 2018-10-07 ENCOUNTER — Encounter (HOSPITAL_COMMUNITY): Payer: Self-pay

## 2018-10-10 ENCOUNTER — Encounter (HOSPITAL_COMMUNITY)
Admission: RE | Admit: 2018-10-10 | Discharge: 2018-10-10 | Disposition: A | Discharge status: Home / Self Care | Payer: Self-pay | Source: Ambulatory Visit | Attending: Cardiovascular Disease | Admitting: Cardiovascular Disease

## 2018-10-12 ENCOUNTER — Encounter (HOSPITAL_COMMUNITY)
Admission: RE | Admit: 2018-10-12 | Discharge: 2018-10-12 | Disposition: A | Discharge status: Home / Self Care | Payer: Self-pay | Source: Ambulatory Visit | Attending: Cardiovascular Disease | Admitting: Cardiovascular Disease

## 2018-10-14 ENCOUNTER — Encounter (HOSPITAL_COMMUNITY): Payer: Self-pay

## 2018-10-17 ENCOUNTER — Encounter (HOSPITAL_COMMUNITY): Payer: Self-pay

## 2018-10-18 ENCOUNTER — Telehealth (HOSPITAL_COMMUNITY): Payer: Self-pay

## 2018-10-19 ENCOUNTER — Encounter (HOSPITAL_COMMUNITY): Payer: Self-pay

## 2018-10-20 ENCOUNTER — Telehealth: Payer: Self-pay | Admitting: Pulmonary Disease

## 2018-10-20 DIAGNOSIS — J9611 Chronic respiratory failure with hypoxia: Secondary | ICD-10-CM

## 2018-10-20 DIAGNOSIS — R0600 Dyspnea, unspecified: Secondary | ICD-10-CM

## 2018-10-20 NOTE — Telephone Encounter (Signed)
OK to place order for ONO on RA

## 2018-10-20 NOTE — Telephone Encounter (Signed)
Spoke with patient. She is requesting to have an order placed for her to have an ONO performed on room air prior to her upcoming appointment. She wants to use [facility].   RA, please advise if you are ok with [place] placing this order. Thanks!

## 2018-10-20 NOTE — Telephone Encounter (Signed)
ONO on roomair has been placed to [facility]. Pt is aware and voiced her understanding. Nothing further is needed.

## 2018-10-21 ENCOUNTER — Encounter: Payer: Self-pay | Admitting: Internal Medicine

## 2018-10-21 ENCOUNTER — Encounter (HOSPITAL_COMMUNITY): Payer: Self-pay

## 2018-10-21 ENCOUNTER — Encounter

## 2018-10-21 ENCOUNTER — Telehealth: Payer: Self-pay | Admitting: Internal Medicine

## 2018-10-21 ENCOUNTER — Ambulatory Visit (INDEPENDENT_AMBULATORY_CARE_PROVIDER_SITE_OTHER): Payer: Medicare Other | Admitting: Internal Medicine

## 2018-10-21 VITALS — BP 120/70 | HR 89 | Temp 98.1°F | Ht 60.0 in | Wt 154.0 lb

## 2018-10-21 DIAGNOSIS — R05 Cough: Secondary | ICD-10-CM

## 2018-10-21 DIAGNOSIS — I25119 Atherosclerotic heart disease of native coronary artery with unspecified angina pectoris: Secondary | ICD-10-CM | POA: Diagnosis not present

## 2018-10-21 DIAGNOSIS — J069 Acute upper respiratory infection, unspecified: Secondary | ICD-10-CM | POA: Diagnosis not present

## 2018-10-21 DIAGNOSIS — I2510 Atherosclerotic heart disease of native coronary artery without angina pectoris: Secondary | ICD-10-CM | POA: Insufficient documentation

## 2018-10-21 DIAGNOSIS — R059 Cough, unspecified: Secondary | ICD-10-CM

## 2018-10-21 MED ORDER — CEFTRIAXONE SODIUM 1 G IJ SOLR
1.0000 g | Freq: Once | INTRAMUSCULAR | Status: AC
  Administered 2018-10-21: 1 g via INTRAMUSCULAR

## 2018-10-21 MED ORDER — DOXYCYCLINE HYCLATE 100 MG PO TABS: 100.0000 mg | ORAL_TABLET | Freq: Two times a day (BID) | ORAL | 0 refills | Status: DC

## 2018-10-21 MED ORDER — FLUCONAZOLE 150 MG PO TABS: 150.0000 mg | ORAL_TABLET | Freq: Once | ORAL | 0 refills | Status: AC

## 2018-10-21 NOTE — Telephone Encounter (Signed)
Spoke with Dr. Renold Genta and she advised patient will have to be seen in the office.  Called patient and provided appointment for today at 4:15.

## 2018-10-21 NOTE — Telephone Encounter (Signed)
Patient calling with cough/congestion x 4 days.  No fever.  Says cough is productive.  Sputum is greenish.  She has been around sick family and children and thinks she got it from them.  She doesn't want to come in because she doesn't want to bring this into the waiting room.  Wants to know if you will call in a Z-Pak?  She also will need a Diflucan because she gets yeast in the mouth.    Thank you.  Pharmacy:  [facility] on [address]  Phone:  [phone]

## 2018-10-24 ENCOUNTER — Encounter (HOSPITAL_COMMUNITY): Payer: Self-pay

## 2018-10-26 ENCOUNTER — Encounter (HOSPITAL_COMMUNITY): Payer: Self-pay

## 2018-10-28 ENCOUNTER — Encounter (HOSPITAL_COMMUNITY): Payer: Self-pay

## 2018-10-30 DIAGNOSIS — R0902 Hypoxemia: Secondary | ICD-10-CM | POA: Diagnosis not present

## 2018-10-30 DIAGNOSIS — J449 Chronic obstructive pulmonary disease, unspecified: Secondary | ICD-10-CM | POA: Diagnosis not present

## 2018-10-31 ENCOUNTER — Encounter (HOSPITAL_COMMUNITY)
Admission: RE | Admit: 2018-10-31 | Discharge: 2018-10-31 | Disposition: A | Discharge status: Home / Self Care | Payer: Self-pay | Source: Ambulatory Visit | Attending: Cardiovascular Disease | Admitting: Cardiovascular Disease

## 2018-11-01 DIAGNOSIS — I10 Essential (primary) hypertension: Secondary | ICD-10-CM | POA: Diagnosis not present

## 2018-11-01 DIAGNOSIS — M545 Low back pain: Secondary | ICD-10-CM | POA: Diagnosis not present

## 2018-11-01 DIAGNOSIS — Z6828 Body mass index (BMI) 28.0-28.9, adult: Secondary | ICD-10-CM | POA: Diagnosis not present

## 2018-11-02 ENCOUNTER — Encounter (HOSPITAL_COMMUNITY)
Admission: RE | Admit: 2018-11-02 | Discharge: 2018-11-02 | Disposition: A | Discharge status: Home / Self Care | Payer: Medicare Other | Source: Ambulatory Visit | Attending: Cardiovascular Disease | Admitting: Cardiovascular Disease

## 2018-11-04 ENCOUNTER — Encounter (HOSPITAL_COMMUNITY): Payer: Self-pay

## 2018-11-07 ENCOUNTER — Encounter (HOSPITAL_COMMUNITY): Payer: Self-pay

## 2018-11-07 ENCOUNTER — Other Ambulatory Visit: Payer: Self-pay | Admitting: Internal Medicine

## 2018-11-09 ENCOUNTER — Encounter (HOSPITAL_COMMUNITY): Payer: Self-pay

## 2018-11-11 ENCOUNTER — Encounter (HOSPITAL_COMMUNITY): Payer: Self-pay

## 2018-11-14 ENCOUNTER — Encounter (HOSPITAL_COMMUNITY): Payer: Self-pay

## 2018-11-14 ENCOUNTER — Ambulatory Visit: Payer: Medicare Other | Admitting: Pulmonary Disease

## 2018-11-16 ENCOUNTER — Encounter (HOSPITAL_COMMUNITY): Payer: Self-pay

## 2018-11-18 NOTE — Progress Notes (Signed)
   Subjective:    Patient ID: Carrie Chung, female    DOB: 01-May-1941, 77 y.o.   MRN: [id number]  HPI 77 year old Female in today with respiratory infection.  Has new grandchild.  Has cough and congestion.  Scratchy throat.  Malaise and fatigue.  No shaking chills.    Review of Systems see above     Objective:   Physical Exam Skin warm and dry.  Nodes none.  Pharynx slightly injected without exudate.  TMs are clear.  Neck is supple.  Chest clear to auscultation without rales or wheezing.       Assessment & Plan:  Acute upper respiratory infection-early sinusitis  Plan: Doxycycline 100 mg twice daily for 10 days.  Rest and drink plenty of fluids.

## 2018-11-18 NOTE — Addendum Note (Signed)
Addended by: [name] on: 11/18/2018 12:30 PM   Modules accepted: Level of Service

## 2018-11-18 NOTE — Patient Instructions (Signed)
Doxycycline 100 mg twice daily for 10 days.  Rest and drink plenty of fluids.

## 2018-11-21 ENCOUNTER — Encounter (HOSPITAL_COMMUNITY): Payer: Self-pay

## 2018-11-21 DIAGNOSIS — I2510 Atherosclerotic heart disease of native coronary artery without angina pectoris: Secondary | ICD-10-CM | POA: Insufficient documentation

## 2018-11-23 ENCOUNTER — Encounter (HOSPITAL_COMMUNITY): Payer: Self-pay

## 2018-11-25 ENCOUNTER — Encounter (HOSPITAL_COMMUNITY): Payer: Self-pay

## 2018-11-28 ENCOUNTER — Ambulatory Visit (INDEPENDENT_AMBULATORY_CARE_PROVIDER_SITE_OTHER): Payer: Medicare Other | Admitting: Pulmonary Disease

## 2018-11-28 ENCOUNTER — Encounter: Payer: Self-pay | Admitting: Pulmonary Disease

## 2018-11-28 ENCOUNTER — Encounter (HOSPITAL_COMMUNITY): Payer: Self-pay

## 2018-11-28 DIAGNOSIS — J329 Chronic sinusitis, unspecified: Secondary | ICD-10-CM | POA: Diagnosis not present

## 2018-11-28 DIAGNOSIS — J9611 Chronic respiratory failure with hypoxia: Secondary | ICD-10-CM

## 2018-11-28 DIAGNOSIS — R0602 Shortness of breath: Secondary | ICD-10-CM

## 2018-11-28 DIAGNOSIS — I25119 Atherosclerotic heart disease of native coronary artery with unspecified angina pectoris: Secondary | ICD-10-CM

## 2018-11-28 DIAGNOSIS — R490 Dysphonia: Secondary | ICD-10-CM | POA: Diagnosis not present

## 2018-11-28 NOTE — Progress Notes (Signed)
   Subjective:    Patient ID: Carrie Chung, female    DOB: 03-04-1941, 77 y.o.   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  Her only complaint was an occasional intermittent dry chronic cough.  She did feel subjectively improved in her quality of sleep after using oxygen and also felt that memory had improved, so we continued it. Follow-up nocturnal oximetry annually has continued to show desaturations for 3 to 4 hours during sleep.  This is confirmed on nocturnal oximetry from 10/30/2018  She otherwise feels well, she had a sinus infection, saw ENT and was given an antibiotic to take for a month On ambulation today, her oxygen saturation does drop to 90% from baseline of 94% and heart rate increased from 93 to 110 on to labs  Significant tests/ events  ONO  10/2017 >>desatn x 3h  PSG 04/2015 ([facility])-didnot show significant OSA -AHI 4.5, RDI 7.6/hwhich showed desaturation for 106 minutes.   PFT 7/2016Nl lung function. No airflow obstruction/restriction. DLCO nml .  CT angio chest 06/2015 neg for PE, mild emphysema .  Bubble echo with gg 1 DD , EF ok , no shunt.  Review of Systems neg for any significant sore throat, dysphagia, itching, sneezing, nasal congestion or excess/ purulent secretions, fever, chills, sweats, unintended wt loss, pleuritic or exertional cp, hempoptysis, orthopnea pnd or change in chronic leg swelling. Also denies presyncope, palpitations, heartburn, abdominal pain, nausea, vomiting, diarrhea or change in bowel or urinary habits, dysuria,hematuria, rash, arthralgias, visual complaints, headache, numbness weakness or ataxia.     Objective:   Physical Exam   Gen. Pleasant, well-nourished, in no distress ENT - no thrush, no post nasal drip Neck: No JVD, no thyromegaly, no carotid bruits Lungs:  no use of accessory muscles, no dullness to percussion, clear without rales or rhonchi  Cardiovascular: Rhythm regular, heart sounds  normal, no murmurs or gallops, no peripheral edema Musculoskeletal: No deformities, no cyanosis or clubbing         Assessment & Plan:

## 2018-11-28 NOTE — Patient Instructions (Signed)
High res CT chest  Ambulatory saturation

## 2018-11-29 NOTE — Assessment & Plan Note (Signed)
Due to prior imaging showing bibasilar atelectasis, and persistent desaturation, will proceed with high-resolution CT of the chest to rule out interstitial lung disease

## 2018-11-29 NOTE — Assessment & Plan Note (Addendum)
Continue 2 L of oxygen during sleep Does not seem to require during daytime She is compliant with oxygen, this is improved her quality of life and help with memory issues

## 2018-11-30 ENCOUNTER — Encounter (HOSPITAL_COMMUNITY)
Admission: RE | Admit: 2018-11-30 | Discharge: 2018-11-30 | Disposition: A | Discharge status: Home / Self Care | Payer: Self-pay | Source: Ambulatory Visit | Attending: Cardiovascular Disease | Admitting: Cardiovascular Disease

## 2018-12-02 ENCOUNTER — Encounter (HOSPITAL_COMMUNITY): Payer: Self-pay

## 2018-12-05 ENCOUNTER — Encounter (HOSPITAL_COMMUNITY)
Admission: RE | Admit: 2018-12-05 | Discharge: 2018-12-05 | Disposition: A | Discharge status: Home / Self Care | Payer: Self-pay | Source: Ambulatory Visit | Attending: Cardiovascular Disease | Admitting: Cardiovascular Disease

## 2018-12-07 ENCOUNTER — Encounter (HOSPITAL_COMMUNITY)
Admission: RE | Admit: 2018-12-07 | Discharge: 2018-12-07 | Disposition: A | Discharge status: Home / Self Care | Payer: Self-pay | Source: Ambulatory Visit | Attending: Cardiovascular Disease | Admitting: Cardiovascular Disease

## 2018-12-07 DIAGNOSIS — M48061 Spinal stenosis, lumbar region without neurogenic claudication: Secondary | ICD-10-CM | POA: Diagnosis not present

## 2018-12-07 DIAGNOSIS — M5416 Radiculopathy, lumbar region: Secondary | ICD-10-CM | POA: Diagnosis not present

## 2018-12-07 DIAGNOSIS — M545 Low back pain: Secondary | ICD-10-CM | POA: Diagnosis not present

## 2018-12-07 DIAGNOSIS — S32030D Wedge compression fracture of third lumbar vertebra, subsequent encounter for fracture with routine healing: Secondary | ICD-10-CM | POA: Diagnosis not present

## 2018-12-09 ENCOUNTER — Encounter (HOSPITAL_COMMUNITY): Payer: Self-pay

## 2018-12-12 ENCOUNTER — Encounter (HOSPITAL_COMMUNITY): Payer: Self-pay

## 2018-12-16 ENCOUNTER — Encounter (HOSPITAL_COMMUNITY): Payer: Self-pay

## 2018-12-19 ENCOUNTER — Encounter (HOSPITAL_COMMUNITY): Payer: Self-pay

## 2018-12-23 ENCOUNTER — Encounter (HOSPITAL_COMMUNITY): Payer: Self-pay

## 2018-12-23 DIAGNOSIS — I2510 Atherosclerotic heart disease of native coronary artery without angina pectoris: Secondary | ICD-10-CM | POA: Insufficient documentation

## 2018-12-26 ENCOUNTER — Encounter (HOSPITAL_COMMUNITY): Payer: Self-pay

## 2018-12-28 ENCOUNTER — Encounter (HOSPITAL_COMMUNITY): Payer: Self-pay

## 2018-12-30 ENCOUNTER — Encounter (HOSPITAL_COMMUNITY): Payer: Self-pay

## 2019-01-02 ENCOUNTER — Encounter (HOSPITAL_COMMUNITY): Payer: Self-pay

## 2019-01-04 ENCOUNTER — Encounter (HOSPITAL_COMMUNITY): Payer: Self-pay

## 2019-01-06 ENCOUNTER — Encounter (HOSPITAL_COMMUNITY): Payer: Self-pay

## 2019-01-09 ENCOUNTER — Encounter (HOSPITAL_COMMUNITY): Payer: Self-pay

## 2019-01-10 ENCOUNTER — Other Ambulatory Visit: Payer: Self-pay | Admitting: Cardiovascular Disease

## 2019-01-11 ENCOUNTER — Encounter (HOSPITAL_COMMUNITY): Payer: Self-pay

## 2019-01-13 ENCOUNTER — Encounter (HOSPITAL_COMMUNITY): Payer: Self-pay

## 2019-01-13 ENCOUNTER — Telehealth: Payer: Self-pay | Admitting: Cardiovascular Disease

## 2019-01-13 NOTE — Telephone Encounter (Signed)
New Message        Patient is calling today to get an appt. There are none. Pls call and advise

## 2019-01-13 NOTE — Telephone Encounter (Signed)
Scheduled patient 5/4 with Dr. Burt Knack. She was grateful for assistance.

## 2019-01-16 ENCOUNTER — Encounter (HOSPITAL_COMMUNITY)
Admission: RE | Admit: 2019-01-16 | Discharge: 2019-01-16 | Disposition: A | Discharge status: Home / Self Care | Payer: Self-pay | Source: Ambulatory Visit | Attending: Cardiovascular Disease | Admitting: Cardiovascular Disease

## 2019-01-18 ENCOUNTER — Encounter (HOSPITAL_COMMUNITY)
Admission: RE | Admit: 2019-01-18 | Discharge: 2019-01-18 | Disposition: A | Discharge status: Home / Self Care | Payer: Self-pay | Source: Ambulatory Visit | Attending: Cardiovascular Disease | Admitting: Cardiovascular Disease

## 2019-01-20 ENCOUNTER — Encounter (HOSPITAL_COMMUNITY)
Admission: RE | Admit: 2019-01-20 | Discharge: 2019-01-20 | Disposition: A | Discharge status: Home / Self Care | Payer: Self-pay | Source: Ambulatory Visit | Attending: Cardiovascular Disease | Admitting: Cardiovascular Disease

## 2019-01-23 ENCOUNTER — Encounter (HOSPITAL_COMMUNITY): Payer: Self-pay

## 2019-01-23 DIAGNOSIS — I2510 Atherosclerotic heart disease of native coronary artery without angina pectoris: Secondary | ICD-10-CM | POA: Insufficient documentation

## 2019-01-25 ENCOUNTER — Encounter (HOSPITAL_COMMUNITY)
Admission: RE | Admit: 2019-01-25 | Discharge: 2019-01-25 | Disposition: A | Discharge status: Home / Self Care | Payer: Self-pay | Source: Ambulatory Visit | Attending: Cardiovascular Disease | Admitting: Cardiovascular Disease

## 2019-01-27 ENCOUNTER — Encounter (HOSPITAL_COMMUNITY): Payer: Self-pay

## 2019-01-30 ENCOUNTER — Encounter (HOSPITAL_COMMUNITY): Payer: Self-pay

## 2019-02-01 ENCOUNTER — Encounter (HOSPITAL_COMMUNITY)
Admission: RE | Admit: 2019-02-01 | Discharge: 2019-02-01 | Disposition: A | Discharge status: Home / Self Care | Payer: Self-pay | Source: Ambulatory Visit | Attending: Cardiovascular Disease | Admitting: Cardiovascular Disease

## 2019-02-03 ENCOUNTER — Encounter (HOSPITAL_COMMUNITY): Payer: Self-pay

## 2019-02-06 ENCOUNTER — Ambulatory Visit
Admission: RE | Admit: 2019-02-06 | Discharge: 2019-02-06 | Disposition: A | Discharge status: Home / Self Care | Payer: Medicare Other | Source: Ambulatory Visit | Attending: Internal Medicine | Admitting: Internal Medicine

## 2019-02-06 ENCOUNTER — Encounter (HOSPITAL_COMMUNITY): Payer: Self-pay

## 2019-02-06 ENCOUNTER — Ambulatory Visit (INDEPENDENT_AMBULATORY_CARE_PROVIDER_SITE_OTHER): Payer: Medicare Other | Admitting: Internal Medicine

## 2019-02-06 VITALS — BP 130/80 | HR 78 | Temp 98.8°F | Wt 153.0 lb

## 2019-02-06 DIAGNOSIS — J22 Unspecified acute lower respiratory infection: Secondary | ICD-10-CM | POA: Diagnosis not present

## 2019-02-06 DIAGNOSIS — R059 Cough, unspecified: Secondary | ICD-10-CM

## 2019-02-06 DIAGNOSIS — R05 Cough: Secondary | ICD-10-CM

## 2019-02-06 MED ORDER — HYDROCODONE-HOMATROPINE 5-1.5 MG/5ML PO SYRP: 5.0000 mL | ORAL_SOLUTION | Freq: Three times a day (TID) | ORAL | 0 refills | Status: DC | PRN

## 2019-02-06 MED ORDER — METHYLPREDNISOLONE ACETATE 80 MG/ML IJ SUSP
80.0000 mg | Freq: Once | INTRAMUSCULAR | Status: AC
  Administered 2019-02-06: 80 mg via INTRAMUSCULAR

## 2019-02-06 NOTE — Progress Notes (Signed)
   Subjective:    Patient ID: Carrie Chung, female    DOB: [birth year], 78 y.o.   MRN: [id number]  HPI 78 year old Female in today with cough and wheezing.  History of coronary artery disease.  May have gotten this from grandchild.  No fever or shaking chills.  No myalgias.    Review of Systems see above has some malaise and fatigue from the respiratory infection.     Objective:   Physical Exam She is afebrile.  Pulse ox is 95% on room air.  Blood pressure 130/80.  Pulse 78 and regular.  Skin is warm and dry.  Nodes none.  TMs and pharynx are clear.  She has scattered inspiratory wheezing.  No lower extremity edema.  Cardiac exam regular rate and rhythm.  Chest x-ray shows no pleural effusion, no cardiomegaly, lungs are clear.       Assessment & Plan:  Acute lower respiratory infection  Plan: Hycodan 1 teaspoon p.o. every 8 hours as needed cough.  Levaquin 500 mg daily for 10 days.  Depo-Medrol 80 mg IM.  Rest and drink plenty of fluids.

## 2019-02-06 NOTE — Patient Instructions (Addendum)
Levaquin 500mg  daily x 10 days. Hycodan one tsp po q 8 hours prn cough. Have CXR Depomedrol 80 mg IM given in office.

## 2019-02-07 ENCOUNTER — Other Ambulatory Visit: Payer: Self-pay | Admitting: Cardiovascular Disease

## 2019-02-08 ENCOUNTER — Encounter (HOSPITAL_COMMUNITY): Payer: Self-pay

## 2019-02-10 ENCOUNTER — Encounter (HOSPITAL_COMMUNITY): Payer: Self-pay

## 2019-02-13 ENCOUNTER — Encounter (HOSPITAL_COMMUNITY): Payer: Self-pay

## 2019-02-15 ENCOUNTER — Encounter (HOSPITAL_COMMUNITY): Payer: Self-pay

## 2019-02-16 ENCOUNTER — Other Ambulatory Visit: Payer: Self-pay | Admitting: Internal Medicine

## 2019-02-17 ENCOUNTER — Encounter (HOSPITAL_COMMUNITY): Payer: Self-pay

## 2019-02-17 ENCOUNTER — Telehealth (HOSPITAL_COMMUNITY): Payer: Self-pay | Admitting: Internal Medicine

## 2019-02-18 ENCOUNTER — Encounter: Payer: Self-pay | Admitting: Internal Medicine

## 2019-02-20 ENCOUNTER — Encounter (HOSPITAL_COMMUNITY)
Admission: RE | Admit: 2019-02-20 | Discharge: 2019-02-20 | Disposition: A | Discharge status: Home / Self Care | Payer: Self-pay | Source: Ambulatory Visit | Attending: Cardiovascular Disease | Admitting: Cardiovascular Disease

## 2019-02-20 DIAGNOSIS — I2510 Atherosclerotic heart disease of native coronary artery without angina pectoris: Secondary | ICD-10-CM | POA: Insufficient documentation

## 2019-02-22 ENCOUNTER — Encounter (HOSPITAL_COMMUNITY)
Admission: RE | Admit: 2019-02-22 | Discharge: 2019-02-22 | Disposition: A | Discharge status: Home / Self Care | Payer: Self-pay | Source: Ambulatory Visit | Attending: Cardiovascular Disease | Admitting: Cardiovascular Disease

## 2019-02-24 ENCOUNTER — Encounter (HOSPITAL_COMMUNITY)
Admission: RE | Admit: 2019-02-24 | Discharge: 2019-02-24 | Disposition: A | Discharge status: Home / Self Care | Payer: Self-pay | Source: Ambulatory Visit | Attending: Cardiovascular Disease | Admitting: Cardiovascular Disease

## 2019-02-24 ENCOUNTER — Other Ambulatory Visit: Payer: Self-pay | Admitting: Internal Medicine

## 2019-02-27 ENCOUNTER — Encounter (HOSPITAL_COMMUNITY)
Admission: RE | Admit: 2019-02-27 | Discharge: 2019-02-27 | Disposition: A | Discharge status: Home / Self Care | Payer: Self-pay | Source: Ambulatory Visit | Attending: Cardiovascular Disease | Admitting: Cardiovascular Disease

## 2019-03-01 ENCOUNTER — Encounter (HOSPITAL_COMMUNITY): Payer: Self-pay

## 2019-03-03 ENCOUNTER — Encounter (HOSPITAL_COMMUNITY): Payer: Self-pay

## 2019-03-06 ENCOUNTER — Telehealth (HOSPITAL_COMMUNITY): Payer: Self-pay | Admitting: *Deleted

## 2019-03-06 ENCOUNTER — Encounter (HOSPITAL_COMMUNITY): Payer: Self-pay

## 2019-03-06 ENCOUNTER — Other Ambulatory Visit: Payer: Self-pay | Admitting: Cardiovascular Disease

## 2019-03-06 ENCOUNTER — Other Ambulatory Visit: Payer: Self-pay | Admitting: Internal Medicine

## 2019-03-06 NOTE — Telephone Encounter (Signed)
Contacted patient to notify of Cardiac Rehab department closure x2 weeks. Pt verbalized understanding. Carrie Chung, Exercise Physiologist Cardiac and Pulmonary Rehabilitaiton 

## 2019-03-08 ENCOUNTER — Encounter (HOSPITAL_COMMUNITY): Payer: Self-pay

## 2019-03-10 ENCOUNTER — Encounter (HOSPITAL_COMMUNITY): Payer: Self-pay

## 2019-03-13 ENCOUNTER — Telehealth (HOSPITAL_COMMUNITY): Payer: Self-pay | Admitting: Internal Medicine

## 2019-03-13 ENCOUNTER — Encounter (HOSPITAL_COMMUNITY): Payer: Self-pay

## 2019-03-15 ENCOUNTER — Encounter (HOSPITAL_COMMUNITY): Payer: Self-pay

## 2019-03-17 ENCOUNTER — Encounter (HOSPITAL_COMMUNITY): Payer: Self-pay

## 2019-03-20 ENCOUNTER — Encounter (HOSPITAL_COMMUNITY): Payer: Self-pay

## 2019-03-22 ENCOUNTER — Encounter (HOSPITAL_COMMUNITY): Payer: Self-pay

## 2019-03-24 ENCOUNTER — Encounter (HOSPITAL_COMMUNITY): Payer: Self-pay

## 2019-03-27 ENCOUNTER — Encounter (HOSPITAL_COMMUNITY): Payer: Self-pay

## 2019-03-29 ENCOUNTER — Encounter (HOSPITAL_COMMUNITY): Payer: Self-pay

## 2019-03-30 ENCOUNTER — Telehealth (HOSPITAL_COMMUNITY): Payer: Self-pay | Admitting: Cardiac Rehabilitation

## 2019-03-30 NOTE — Telephone Encounter (Signed)
Pt phone call to inform of continued Outpatient Cardiac Rehab departmental closure for COVID 19 precautions. Future opening date to be determined. Pt instructed to continue exercising on her  own following home exercise guidelines.She is continuing to walk on her own.Pt advised to contact cardiology or PCP PRN symptoms, questions or concerns. Pt c/o sinus congestion with sore throat. Afebrile.  Otherwise asymptomatic.  Pt advised to contact PCP today for recommendation.  Understanding verbalized. Pt denies food insecurity or other needs at this time. Pt offered emotional support and understanding. Pt expressed gratitude for the call and is interested in resuming CRPII when available.  [name], RN, BSN Cardiac Pulmonary Rehab 

## 2019-03-31 ENCOUNTER — Encounter (HOSPITAL_COMMUNITY): Payer: Self-pay

## 2019-04-03 ENCOUNTER — Encounter (HOSPITAL_COMMUNITY): Payer: Self-pay

## 2019-04-05 ENCOUNTER — Encounter (HOSPITAL_COMMUNITY): Payer: Self-pay

## 2019-04-07 ENCOUNTER — Encounter (HOSPITAL_COMMUNITY): Payer: Self-pay

## 2019-04-10 ENCOUNTER — Encounter (HOSPITAL_COMMUNITY): Payer: Self-pay

## 2019-04-11 ENCOUNTER — Telehealth: Payer: Self-pay

## 2019-04-11 NOTE — Telephone Encounter (Signed)
Due to COVID-19 restrictions, called to change physical visit with Dr. Burt Knack scheduled 04/24/19 to eVisit on 4/23. The patient declines a visit at this time as she feels well. Scheduled her in August for face to face visit. She was grateful for assistance. She understands to call prior to that visit if she has any questions or concerns.

## 2019-04-12 ENCOUNTER — Encounter (HOSPITAL_COMMUNITY): Payer: Self-pay

## 2019-04-14 ENCOUNTER — Encounter (HOSPITAL_COMMUNITY): Payer: Self-pay

## 2019-04-17 ENCOUNTER — Encounter (HOSPITAL_COMMUNITY): Payer: Self-pay

## 2019-04-19 ENCOUNTER — Encounter (HOSPITAL_COMMUNITY): Payer: Self-pay

## 2019-04-21 ENCOUNTER — Encounter (HOSPITAL_COMMUNITY): Payer: Self-pay

## 2019-04-24 ENCOUNTER — Encounter (HOSPITAL_COMMUNITY): Payer: Self-pay

## 2019-04-24 ENCOUNTER — Ambulatory Visit: Payer: Medicare Other | Admitting: Cardiovascular Disease

## 2019-04-26 ENCOUNTER — Encounter (HOSPITAL_COMMUNITY): Payer: Self-pay

## 2019-04-28 ENCOUNTER — Encounter (HOSPITAL_COMMUNITY): Payer: Self-pay

## 2019-05-01 ENCOUNTER — Encounter (HOSPITAL_COMMUNITY): Payer: Self-pay

## 2019-05-03 ENCOUNTER — Encounter (HOSPITAL_COMMUNITY): Payer: Self-pay

## 2019-05-05 ENCOUNTER — Encounter (HOSPITAL_COMMUNITY): Payer: Self-pay

## 2019-05-08 ENCOUNTER — Encounter (HOSPITAL_COMMUNITY): Payer: Self-pay

## 2019-05-10 ENCOUNTER — Encounter (HOSPITAL_COMMUNITY): Payer: Self-pay

## 2019-05-12 ENCOUNTER — Encounter (HOSPITAL_COMMUNITY): Payer: Self-pay

## 2019-05-17 ENCOUNTER — Encounter (HOSPITAL_COMMUNITY): Payer: Self-pay

## 2019-05-19 ENCOUNTER — Encounter (HOSPITAL_COMMUNITY): Payer: Self-pay

## 2019-05-22 ENCOUNTER — Encounter (HOSPITAL_COMMUNITY): Payer: Self-pay

## 2019-05-24 ENCOUNTER — Encounter (HOSPITAL_COMMUNITY): Payer: Self-pay

## 2019-05-26 ENCOUNTER — Encounter (HOSPITAL_COMMUNITY): Payer: Self-pay

## 2019-05-29 ENCOUNTER — Encounter (HOSPITAL_COMMUNITY): Payer: Self-pay

## 2019-05-31 ENCOUNTER — Encounter (HOSPITAL_COMMUNITY): Payer: Self-pay

## 2019-06-02 ENCOUNTER — Encounter (HOSPITAL_COMMUNITY): Payer: Self-pay

## 2019-06-05 ENCOUNTER — Encounter (HOSPITAL_COMMUNITY): Payer: Self-pay

## 2019-06-07 ENCOUNTER — Encounter (HOSPITAL_COMMUNITY): Payer: Self-pay

## 2019-06-09 ENCOUNTER — Encounter (HOSPITAL_COMMUNITY): Payer: Self-pay

## 2019-06-12 ENCOUNTER — Encounter (HOSPITAL_COMMUNITY): Payer: Self-pay

## 2019-06-14 ENCOUNTER — Encounter (HOSPITAL_COMMUNITY): Payer: Self-pay

## 2019-06-16 ENCOUNTER — Encounter (HOSPITAL_COMMUNITY): Payer: Self-pay

## 2019-06-19 ENCOUNTER — Encounter (HOSPITAL_COMMUNITY): Payer: Self-pay

## 2019-06-20 ENCOUNTER — Telehealth (HOSPITAL_COMMUNITY): Payer: Self-pay | Admitting: *Deleted

## 2019-06-20 NOTE — Telephone Encounter (Signed)
Called to update patient on the status of the maintenance Cardiac Rehab exercise classes.  At this time they remain on hold due to the COVID-19 pandemic precautions.

## 2019-06-21 ENCOUNTER — Encounter (HOSPITAL_COMMUNITY): Payer: Self-pay

## 2019-06-26 ENCOUNTER — Encounter (HOSPITAL_COMMUNITY): Payer: Self-pay

## 2019-06-28 ENCOUNTER — Encounter (HOSPITAL_COMMUNITY): Payer: Self-pay

## 2019-06-30 ENCOUNTER — Encounter (HOSPITAL_COMMUNITY): Payer: Self-pay

## 2019-07-03 ENCOUNTER — Encounter (HOSPITAL_COMMUNITY): Payer: Self-pay

## 2019-07-05 ENCOUNTER — Encounter (HOSPITAL_COMMUNITY): Payer: Self-pay

## 2019-07-07 ENCOUNTER — Encounter (HOSPITAL_COMMUNITY): Payer: Self-pay

## 2019-07-10 ENCOUNTER — Encounter (HOSPITAL_COMMUNITY): Payer: Self-pay

## 2019-07-12 ENCOUNTER — Encounter (HOSPITAL_COMMUNITY): Payer: Self-pay

## 2019-07-14 ENCOUNTER — Encounter (HOSPITAL_COMMUNITY): Payer: Self-pay

## 2019-07-17 ENCOUNTER — Encounter (HOSPITAL_COMMUNITY): Payer: Self-pay

## 2019-07-19 ENCOUNTER — Encounter (HOSPITAL_COMMUNITY): Payer: Self-pay

## 2019-07-21 ENCOUNTER — Encounter (HOSPITAL_COMMUNITY): Payer: Self-pay

## 2019-07-24 ENCOUNTER — Encounter (HOSPITAL_COMMUNITY): Payer: Self-pay

## 2019-07-26 ENCOUNTER — Encounter (HOSPITAL_COMMUNITY): Payer: Self-pay

## 2019-07-28 ENCOUNTER — Encounter (HOSPITAL_COMMUNITY): Payer: Self-pay

## 2019-07-31 ENCOUNTER — Encounter (HOSPITAL_COMMUNITY): Payer: Self-pay

## 2019-08-01 ENCOUNTER — Other Ambulatory Visit: Payer: Self-pay | Admitting: Cardiovascular Disease

## 2019-08-02 ENCOUNTER — Encounter (HOSPITAL_COMMUNITY): Payer: Self-pay

## 2019-08-04 ENCOUNTER — Encounter (HOSPITAL_COMMUNITY): Payer: Self-pay

## 2019-08-07 ENCOUNTER — Encounter (HOSPITAL_COMMUNITY): Payer: Self-pay

## 2019-08-07 ENCOUNTER — Other Ambulatory Visit: Payer: Self-pay

## 2019-08-07 ENCOUNTER — Other Ambulatory Visit: Payer: Medicare Other | Admitting: Internal Medicine

## 2019-08-07 DIAGNOSIS — M48062 Spinal stenosis, lumbar region with neurogenic claudication: Secondary | ICD-10-CM

## 2019-08-07 DIAGNOSIS — I10 Essential (primary) hypertension: Secondary | ICD-10-CM | POA: Diagnosis not present

## 2019-08-07 DIAGNOSIS — K219 Gastro-esophageal reflux disease without esophagitis: Secondary | ICD-10-CM | POA: Diagnosis not present

## 2019-08-07 DIAGNOSIS — R7302 Impaired glucose tolerance (oral): Secondary | ICD-10-CM

## 2019-08-07 DIAGNOSIS — F329 Major depressive disorder, single episode, unspecified: Secondary | ICD-10-CM | POA: Diagnosis not present

## 2019-08-07 DIAGNOSIS — E781 Pure hyperglyceridemia: Secondary | ICD-10-CM

## 2019-08-07 DIAGNOSIS — F32A Depression, unspecified: Secondary | ICD-10-CM

## 2019-08-07 DIAGNOSIS — F419 Anxiety disorder, unspecified: Secondary | ICD-10-CM | POA: Diagnosis not present

## 2019-08-07 DIAGNOSIS — Z8679 Personal history of other diseases of the circulatory system: Secondary | ICD-10-CM

## 2019-08-07 DIAGNOSIS — G4709 Other insomnia: Secondary | ICD-10-CM | POA: Diagnosis not present

## 2019-08-07 DIAGNOSIS — G4733 Obstructive sleep apnea (adult) (pediatric): Secondary | ICD-10-CM

## 2019-08-07 DIAGNOSIS — Z0000 Encounter for general adult medical examination without abnormal findings: Secondary | ICD-10-CM

## 2019-08-09 ENCOUNTER — Encounter (HOSPITAL_COMMUNITY): Payer: Self-pay

## 2019-08-09 LAB — COMPLETE METABOLIC PANEL WITH GFR
AG Ratio: 1.9 (calc) (ref 1.0–2.5)
ALT: 11 U/L (ref 6–29)
AST: 18 U/L (ref 10–35)
Albumin: 4.3 g/dL (ref 3.6–5.1)
Alkaline phosphatase (APISO): 60 U/L (ref 37–153)
BUN: 14 mg/dL (ref 7–25)
CO2: 35 mmol/L — ABNORMAL HIGH (ref 20–32)
Calcium: 10.1 mg/dL (ref 8.6–10.4)
Chloride: 100 mmol/L (ref 98–110)
Creat: 0.66 mg/dL (ref 0.60–0.93)
GFR, Est African American: 98 mL/min/{1.73_m2} (ref >=60)
GFR, Est Non African American: 85 mL/min/{1.73_m2} (ref >=60)
Globulin: 2.3 g/dL (calc) (ref 1.9–3.7)
Glucose, Bld: 128 mg/dL — ABNORMAL HIGH (ref 65–99)
Potassium: 4.4 mmol/L (ref 3.5–5.3)
Sodium: 144 mmol/L (ref 135–146)
Total Bilirubin: 0.5 mg/dL (ref 0.2–1.2)
Total Protein: 6.6 g/dL (ref 6.1–8.1)

## 2019-08-09 LAB — CBC WITH DIFFERENTIAL/PLATELET
Absolute Monocytes: 359 cells/uL (ref 200–950)
Basophils Absolute: 21 cells/uL (ref 0–200)
Basophils Relative: 0.4 %
Eosinophils Absolute: 130 cells/uL (ref 15–500)
Eosinophils Relative: 2.5 %
HCT: 42 % (ref 35.0–45.0)
Hemoglobin: 14.3 g/dL (ref 11.7–15.5)
Lymphs Abs: 1264 cells/uL (ref 850–3900)
MCH: 32.1 pg (ref 27.0–33.0)
MCHC: 34 g/dL (ref 32.0–36.0)
MCV: 94.2 fL (ref 80.0–100.0)
MPV: 9.8 fL (ref 7.5–12.5)
Monocytes Relative: 6.9 %
Neutro Abs: 3427 cells/uL (ref 1500–7800)
Neutrophils Relative %: 65.9 %
Platelets: 168 10*3/uL (ref 140–400)
RBC: 4.46 10*6/uL (ref 3.80–5.10)
RDW: 11.9 % (ref 11.0–15.0)
Total Lymphocyte: 24.3 %
WBC: 5.2 10*3/uL (ref 3.8–10.8)

## 2019-08-09 LAB — LIPID PANEL
Cholesterol: 175 mg/dL (ref <200)
HDL: 73 mg/dL (ref >=50)
LDL Cholesterol (Calc): 75 mg/dL (calc)
Non-HDL Cholesterol (Calc): 102 mg/dL (calc) (ref <130)
Total CHOL/HDL Ratio: 2.4 (calc) (ref <5.0)
Triglycerides: 178 mg/dL — ABNORMAL HIGH (ref <150)

## 2019-08-09 LAB — HEMOGLOBIN A1C
Hgb A1c MFr Bld: 5.8 % of total Hgb — ABNORMAL HIGH (ref <5.7)
Mean Plasma Glucose: 120 (calc)
eAG (mmol/L): 6.6 (calc)

## 2019-08-09 LAB — PROTEIN ELECTROPHORESIS, SERUM
Albumin ELP: 4.1 g/dL (ref 3.8–4.8)
Alpha 1: 0.4 g/dL — ABNORMAL HIGH (ref 0.2–0.3)
Alpha 2: 0.7 g/dL (ref 0.5–0.9)
Beta 2: 0.4 g/dL (ref 0.2–0.5)
Beta Globulin: 0.5 g/dL (ref 0.4–0.6)
Gamma Globulin: 0.8 g/dL (ref 0.8–1.7)
Total Protein: 6.9 g/dL (ref 6.1–8.1)

## 2019-08-09 LAB — TSH: TSH: 2.37 mIU/L (ref 0.40–4.50)

## 2019-08-11 ENCOUNTER — Encounter (HOSPITAL_COMMUNITY): Payer: Self-pay

## 2019-08-14 ENCOUNTER — Other Ambulatory Visit: Payer: Self-pay

## 2019-08-14 ENCOUNTER — Ambulatory Visit (INDEPENDENT_AMBULATORY_CARE_PROVIDER_SITE_OTHER): Payer: Medicare Other | Admitting: Internal Medicine

## 2019-08-14 ENCOUNTER — Encounter: Payer: Self-pay | Admitting: Internal Medicine

## 2019-08-14 ENCOUNTER — Encounter (HOSPITAL_COMMUNITY): Payer: Self-pay

## 2019-08-14 VITALS — BP 160/90 | HR 77 | Temp 97.8°F | Ht 60.0 in | Wt 154.0 lb

## 2019-08-14 DIAGNOSIS — F329 Major depressive disorder, single episode, unspecified: Secondary | ICD-10-CM | POA: Diagnosis not present

## 2019-08-14 DIAGNOSIS — I252 Old myocardial infarction: Secondary | ICD-10-CM | POA: Diagnosis not present

## 2019-08-14 DIAGNOSIS — I10 Essential (primary) hypertension: Secondary | ICD-10-CM | POA: Diagnosis not present

## 2019-08-14 DIAGNOSIS — G4733 Obstructive sleep apnea (adult) (pediatric): Secondary | ICD-10-CM

## 2019-08-14 DIAGNOSIS — Z8679 Personal history of other diseases of the circulatory system: Secondary | ICD-10-CM | POA: Diagnosis not present

## 2019-08-14 DIAGNOSIS — G4709 Other insomnia: Secondary | ICD-10-CM

## 2019-08-14 DIAGNOSIS — E7849 Other hyperlipidemia: Secondary | ICD-10-CM

## 2019-08-14 DIAGNOSIS — Z0000 Encounter for general adult medical examination without abnormal findings: Secondary | ICD-10-CM | POA: Diagnosis not present

## 2019-08-14 DIAGNOSIS — F419 Anxiety disorder, unspecified: Secondary | ICD-10-CM | POA: Diagnosis not present

## 2019-08-14 DIAGNOSIS — R7302 Impaired glucose tolerance (oral): Secondary | ICD-10-CM

## 2019-08-14 DIAGNOSIS — K219 Gastro-esophageal reflux disease without esophagitis: Secondary | ICD-10-CM | POA: Diagnosis not present

## 2019-08-14 DIAGNOSIS — M48062 Spinal stenosis, lumbar region with neurogenic claudication: Secondary | ICD-10-CM | POA: Diagnosis not present

## 2019-08-14 DIAGNOSIS — F32A Depression, unspecified: Secondary | ICD-10-CM

## 2019-08-15 ENCOUNTER — Encounter: Payer: Self-pay | Admitting: Internal Medicine

## 2019-08-15 MED ORDER — ALPRAZOLAM 1 MG PO TABS: 1.0000 mg | ORAL_TABLET | Freq: Every evening | ORAL | 5 refills | Status: DC | PRN

## 2019-08-15 MED ORDER — TRIAMCINOLONE ACETONIDE 0.1 % EX CREA: 1.0000 "application " | TOPICAL_CREAM | Freq: Three times a day (TID) | CUTANEOUS | 0 refills | Status: DC

## 2019-08-15 NOTE — Patient Instructions (Addendum)
It was a pleasure to see you today.  Please watch diet and get exercise.  Follow-up in 6 months.  Continue current medications.  Add high-dose flu vaccine at pharmacy.  No change in medications.

## 2019-08-15 NOTE — Progress Notes (Signed)
Subjective:    Patient ID: Carrie Chung, female    DOB: 1941-10-20, 78 y.o.   MRN: [id number]  HPI  78 year old Female for Medicare wellness, health maintenance exam and evaluation of medical issues.  Not following low fat diet during the pandemic. Is to see Cardiologist soon.  On March 26, 2012 she presented to the hospital with chest pain and was placed on IV nitroglycerin and IV heparin with plans of a possible heart cath the following day but she developed recurrent chest pain with diaphoresis bradycardia in the 40s with hypotension and EKG changes.  She was taken emergently to the Cath Lab and underwent PTCA with drug-eluting stent x1 to the mid RCA.  When returned to the Cath Lab on April 9 and had stents placed to the mid LAD and proximal circumflex arteries.  Subsequently was enrolled in cardiac rehab and has done well.  History of trigger finger seen by Dr. Bronson Ing.  The physical therapy  History of essential hypertension, anxiety depression, hyperlipidemia, insomnia and lumbar back pain.  History of obstructive sleep apnea and impaired glucose tolerance.  History of mild cognitive deficits followed by neurology.  Longstanding history of insomnia.  History of GE reflux.  Patient had brucellosis in 1961.  She is allergic to penicillin, and tetracycline.  She has had adverse reactions to Rozerem, Ambien and trazodone.  Social history: Her husband is a retired Pension scheme manager.  She has an adult daughter and 2 grandchildren that live here in [place].  She quit smoking over 20 years ago.  Social alcohol consumption.  Family history: Mother died at age 72 with history of cardiovascular disease.  Father died at age 24 of cancer of the colon.  1 daughter in good health.  2 sisters in good health.  Past medical history: Tonsillectomy and adenoidectomy, appendectomy, removal of Morton's neuroma.  Had herniated disc L3-L4-L5 in 1994 has seen Dr. Vertell Limber for back pain with  radiculopathy and had epidural steroid injections with good relief.  Has had tear of right supraspinatus muscle September 2003.  Right knee meniscal tear 1975.  Fractured pelvis 2001.  Cholecystectomy 1979.  C-section 1984.  In March 2018 she had a bout of right-sided back pain radiating into her right buttock.  History of MRI of 2014 of the LS-spine showed central disc herniation L1-L2 indenting the thecal sac slightly.  At L3-L4 had desiccation of disc and bulging disc.  She responded to prednisone and improved but had a fall in February 2019 resulting in a closed compression fracture of L3.  MRI of the LS-spine ordered by Dr. Vertell Limber March 2019 showed extruded disc fragment L1-L2 in the midline extending cranially unchanged from prior study.  Moderate to advanced disc disease with multiple bulging disc and chronic fracture of T12.  Severe spinal stenosis L3-L4 and L4-L5.     Review of Systems no new complaints     Objective:   Physical Exam Blood pressure 160/90 pulse 77 temperature 97.8 degrees orally weight 154 pounds height 5 feet 0 inches BMI 30.08  Skin warm and dry.  Nodes none.  TMs and pharynx are clear.  Neck is supple without JVD thyromegaly or carotid bruits.  Chest clear to auscultation without rales or wheezing.  Cardiac exam regular rate and rhythm normal S1 and S2 without ectopy.  Abdomen soft nondistended without hepatosplenomegaly masses or tenderness.  Pelvic exam deferred to Dr. Edwinna Areola.  Extremities without edema.  Neuro no gross focal deficits on brief neurological  exam.  She is alert and oriented x3.       Assessment & Plan:  Coronary artery disease followed by Dr. Burt Knack with upcoming appointment  Lumbar disc disease with history of compression fracture and spinal stenosis followed by Dr. Vertell Limber  Insomnia  Anxiety depression  History of sleep apnea treated with CPAP  Essential hypertension- blood pressure elevated today but indicates it is better at home   Hyperlipidemia treated with statin medication  GE reflux treated with PPI  Obesity  Mild cognitive impairment followed by neurology  Impaired glucose tolerance  Plan: Her triglycerides are elevated at 178 otherwise lipid panel is normal.  Fasting glucose 128.  Hemoglobin A1c 5.8%.  Kidney and liver functions are normal.  CBC is normal.  Patient advised to watch diet and get more exercise.  Has not been able to go to cardiac rehab due to the pandemic.  Will get high-dose Fluzone vaccine at pharmacy.  Continue same medications and follow-up in 6 months.  Subjective:   Patient presents for Medicare Annual/Subsequent preventive examination.  Review Past Medical/Family/Social: See above   Risk Factors  Current exercise habits: Used to get more exercise when going to cardiac rehab Dietary issues discussed: Low-fat low carbohydrate  Cardiac risk factors: Family history and personal history of coronary artery disease  Depression Screen  (Note: if answer to either of the following is "Yes", a more complete depression screening is indicated)   Over the past two weeks, have you felt down, depressed or hopeless?  Yes but may be pandemic related Over the past two weeks, have you felt little interest or pleasure in doing things? No Have you lost interest or pleasure in daily life? No Do you often feel hopeless?  Sometimes Do you cry easily over simple problems? No   Activities of Daily Living  In your present state of health, do you have any difficulty performing the following activities?:   Driving? No  Managing money? No  Feeding yourself? No  Getting from bed to chair? No  Climbing a flight of stairs? No  Preparing food and eating?: No  Bathing or showering? No  Getting dressed: No  Getting to the toilet? No  Using the toilet:No  Moving around from place to place: No  In the past year have you fallen or had a near fall?:No  Are you sexually active?  Yes Do you have more  than one partner? No   Hearing Difficulties: No  Do you often ask people to speak up or repeat themselves?  Occasionally Do you experience ringing or noises in your ears? No  Do you have difficulty understanding soft or whispered voices? No  Do you feel that you have a problem with memory? No Do you often misplace items? No    Home Safety:  Do you have a smoke alarm at your residence? Yes Do you have grab bars in the bathroom? Do you have throw rugs in your house?  A few   Cognitive Testing  Alert? Yes Normal Appearance?Yes  Oriented to person? Yes Place? Yes  Time? Yes  Recall of three objects?  Not tested Can perform simple calculations? Yes  Displays appropriate judgment?Yes  Can read the correct time from a watch face?Yes   List the Names of Other Physician/Practitioners you currently use:  See referral list for the physicians patient is currently seeing.  Dr. Burt Knack  Dr. Vertell Limber   Review of Systems: See above   Objective:     General appearance:  Appears stated age and mildly obese  Head: Normocephalic, without obvious abnormality, atraumatic  Eyes: conj clear, EOMi PEERLA  Ears: normal TM's and external ear canals both ears  Nose: Nares normal. Septum midline. Mucosa normal. No drainage or sinus tenderness.  Throat: lips, mucosa, and tongue normal; teeth and gums normal  Neck: no adenopathy, no carotid bruit, no JVD, supple, symmetrical, trachea midline and thyroid not enlarged, symmetric, no tenderness/mass/nodules  No CVA tenderness.  Lungs: clear to auscultation bilaterally  Breasts: normal appearance, no masses or tenderness Heart: regular rate and rhythm, S1, S2 normal, no murmur, click, rub or gallop  Abdomen: soft, non-tender; bowel sounds normal; no masses, no organomegaly  Musculoskeletal: ROM normal in all joints, no crepitus, no deformity, Normal muscle strengthen. Back  is symmetric, no curvature. Skin: Skin color, texture, turgor normal. No rashes or  lesions  Lymph nodes: Cervical, supraclavicular, and axillary nodes normal.  Neurologic: CN 2 -12 Normal, Normal symmetric reflexes. Normal coordination and gait  Psych: Alert & Oriented x 3, Mood appear stable.    Assessment:    Annual wellness medicare exam   Plan:    During the course of the visit the patient was educated and counseled about appropriate screening and preventive services including:   Annual flu vaccine  Annual mammogram     Patient Instructions (the written plan) was given to the patient.  Medicare Attestation  I have personally reviewed:  The patient's medical and social history  Their use of alcohol, tobacco or illicit drugs  Their current medications and supplements  The patient's functional ability including ADLs,fall risks, home safety risks, cognitive, and hearing and visual impairment  Diet and physical activities  Evidence for depression or mood disorders  The patient's weight, height, BMI, and visual acuity have been recorded in the chart. I have made referrals, counseling, and provided education to the patient based on review of the above and I have provided the patient with a written personalized care plan for preventive services.

## 2019-08-16 ENCOUNTER — Encounter (HOSPITAL_COMMUNITY): Payer: Self-pay

## 2019-08-16 ENCOUNTER — Encounter: Payer: Self-pay | Admitting: Cardiovascular Disease

## 2019-08-16 ENCOUNTER — Ambulatory Visit (INDEPENDENT_AMBULATORY_CARE_PROVIDER_SITE_OTHER): Payer: Medicare Other | Admitting: Cardiovascular Disease

## 2019-08-16 ENCOUNTER — Other Ambulatory Visit: Payer: Self-pay

## 2019-08-16 VITALS — BP 132/66 | HR 69 | Ht 61.5 in | Wt 156.8 lb

## 2019-08-16 DIAGNOSIS — E782 Mixed hyperlipidemia: Secondary | ICD-10-CM

## 2019-08-16 DIAGNOSIS — I10 Essential (primary) hypertension: Secondary | ICD-10-CM | POA: Diagnosis not present

## 2019-08-16 DIAGNOSIS — I25119 Atherosclerotic heart disease of native coronary artery with unspecified angina pectoris: Secondary | ICD-10-CM

## 2019-08-16 NOTE — Progress Notes (Signed)
Cardiology Office Note:    Date:  08/21/2019   ID:  Carrie, Chung 11-Sep-1941, MRN [id number]  PCP:  [name], MD  Cardiologist:  [name], MD  Electrophysiologist:  None   Referring MD: [name], MD   Chief Complaint  Patient presents with  . Coronary Artery Disease    History of Present Illness:    Carrie Chung is a 78 y.o. female with a hx of coronary artery disease, presenting for follow-up evaluation.  The patient initially presented with non-ST elevation infarct in 2013.  She was found to have multivessel coronary artery disease and underwent multivessel stenting at that time.  She has been managed medically since then with no recurrent ischemic events.  The patient is here alone today and denies symptoms of chest pain, chest pressure, edema, orthopnea, PND, or heart palpitations.  She does have mild shortness of breath with activity.  She is not been as active during the COVID-19 pandemic.  Past Medical History:  Diagnosis Date  . Atrial tachycardia (HCC)    a. noted at cardiac rehab 5/13; event monitor ordered to assess for AFib  . CAD (coronary artery disease)    a. NSTEMI 03/2012 (Carrie Chung 03/27/12: pLAD 30%, mLAD 99%, mCFX 95%, mRCA 99% with thrombus, EF 65%);  s/p PTCA/DESx1 to prox-mid RCA 03/27/12 urgently in setting of hypotension/bradycardia, with staged PTCA/Evolve study stent to mid LAD & PTCA/Evolve study stent to prox LCx 03/29/12 ;   echo 03/28/12:EF 60%, Aortic sclerosis without AS, mild RVE, mild reduced RVSF    . Endometrial polyp   . HTN (hypertension)   . Hyperglycemia   . Hyperlipidemia   . Hypokalemia   . MI, old   . Sleep apnea     Past Surgical History:  Procedure Laterality Date  . APPENDECTOMY  78 years old  . CAROTID STENT  04/2012   x3  . CESAREAN SECTION  1984  . CHOLECYSTECTOMY  1978  . LEFT HEART CATHETERIZATION WITH CORONARY ANGIOGRAM N/A 03/27/2012   Procedure: LEFT HEART CATHETERIZATION WITH CORONARY ANGIOGRAM;   Surgeon: Carrie Blanks, MD;  Location: [facility] CATH LAB;  Service: Cardiovascular;  Laterality: N/A;  . PERCUTANEOUS CORONARY STENT INTERVENTION (PCI-S) N/A 03/27/2012   Procedure: PERCUTANEOUS CORONARY STENT INTERVENTION (PCI-S);  Surgeon: Carrie Blanks, MD;  Location: [facility] CATH LAB;  Service: Cardiovascular;  Laterality: N/A;  . PERCUTANEOUS CORONARY STENT INTERVENTION (PCI-S) N/A 03/29/2012   Procedure: PERCUTANEOUS CORONARY STENT INTERVENTION (PCI-S);  Surgeon: [name], MD;  Location: [facility] CATH LAB;  Service: Cardiovascular;  Laterality: N/A;  . TONSILLECTOMY  78 years old    Current Medications: Current Meds  Medication Sig  . ALPRAZolam (XANAX) 1 MG tablet Take 1 tablet (1 mg total) by mouth at bedtime as needed. for sleep  . amLODipine (NORVASC) 5 MG tablet TAKE 1 TABLET BY MOUTH EVERY DAY  . aspirin 81 MG tablet Take 1 tablet (81 mg total) by mouth daily.  . Calcium Carbonate-Vitamin D (CALTRATE 600+D) 600-400 MG-UNIT per tablet Take 1 tablet by mouth 2 (two) times daily.   . clopidogrel (PLAVIX) 75 MG tablet TAKE 1 TABLET(75 MG) BY MOUTH DAILY  . desvenlafaxine (PRISTIQ) 50 MG 24 hr tablet TAKE 1 TABLET(50 MG) BY MOUTH DAILY  . esomeprazole (NEXIUM) 40 MG capsule Take 40 mg by mouth daily at 12 noon.  . FOLBIC 2.5-25-2 MG TABS tablet TAKE 1 TABLET BY MOUTH DAILY  . hydrochlorothiazide (HYDRODIURIL) 25 MG tablet TAKE 1 TABLET BY  MOUTH EVERY DAY  . metoprolol tartrate (LOPRESSOR) 50 MG tablet TAKE 1 AND 1/2 TABLETS(75 MG) BY MOUTH TWICE DAILY  . nitroGLYCERIN (NITROSTAT) 0.4 MG SL tablet PLACE 1 TAB UNDER TONGUE EVERY 5 MINUTES FOR UP TO 3 DOSES AS NEEDED FOR CHEST PAIN  . NON FORMULARY Oxygen 2L at night  . rosuvastatin (CRESTOR) 10 MG tablet TAKE 1 TABLET BY MOUTH EVERY DAY  . triamcinolone cream (KENALOG) 0.1 % Apply 1 application topically 3 (three) times daily.     Allergies:   Penicillins, Sulfonamide derivatives, and Sulfamethoxazole   Social History    Socioeconomic History  . Marital status: Married    Spouse name: Not on file  . Number of children: 1  . Years of education: Coll +  . Highest education level: Not on file  Occupational History  . Occupation: Retired Ship broker  . Financial resource strain: Not on file  . Food insecurity    Worry: Not on file    Inability: Not on file  . Transportation needs    Medical: Not on file    Non-medical: Not on file  Tobacco Use  . Smoking status: Former Smoker    Types: Cigarettes    Quit date: 04/19/1980    Years since quitting: 39.3  . Smokeless tobacco: Never Used  Substance and Sexual Activity  . Alcohol use: Yes    Alcohol/week: 20.0 standard drinks    Types: 20 Glasses of wine per week    Comment: socially  . Drug use: No  . Sexual activity: Never  Lifestyle  . Physical activity    Days per week: Not on file    Minutes per session: Not on file  . Stress: Not on file  Relationships  . Social Herbalist on phone: Not on file    Gets together: Not on file    Attends religious service: Not on file    Active member of club or organization: Not on file    Attends meetings of clubs or organizations: Not on file    Relationship status: Not on file  Other Topics Concern  . Not on file  Social History Narrative   Caffeine 2 cups tea.     Family History: The patient's family history includes Cancer - Other in her father; Colon cancer in her father; Coronary artery disease in her mother.  ROS:   Please see the history of present illness.    All other systems reviewed and are negative.  EKGs/Labs/Other Studies Reviewed:    EKG:  EKG is ordered today.  The ekg ordered today demonstrates normal sinus rhythm 69 bpm, nonspecific T wave abnormality.  Recent Labs: 08/07/2019: ALT 11; BUN 14; Creat 0.66; Hemoglobin 14.3; Platelets 168; Potassium 4.4; Sodium 144; TSH 2.37  Recent Lipid Panel    Component Value Date/Time   CHOL 175 08/07/2019 0937   TRIG  178 (H) 08/07/2019 0937   HDL 73 08/07/2019 0937   CHOLHDL 2.4 08/07/2019 0937   VLDL 41 (H) 03/25/2017 1102   LDLCALC 75 08/07/2019 0937   LDLDIRECT 152.4 03/09/2012 0900    Physical Exam:    VS:  BP 132/66   Pulse 69   Ht 5' 1.5" (1.562 m)   Wt 156 lb 12.8 oz (71.1 kg)   SpO2 93%   BMI 29.15 kg/m     Wt Readings from Last 3 Encounters:  08/16/19 156 lb 12.8 oz (71.1 kg)  08/14/19 154 lb (69.9 kg)  02/06/19 153 lb (69.4 kg)     GEN:  Well nourished, well developed in no acute distress HEENT: Normal NECK: No JVD; No carotid bruits LYMPHATICS: No lymphadenopathy CARDIAC: RRR, no murmurs, rubs, gallops RESPIRATORY:  Clear to auscultation without rales, wheezing or rhonchi  ABDOMEN: Soft, non-tender, non-distended MUSCULOSKELETAL:  No edema; No deformity  SKIN: Warm and dry NEUROLOGIC:  Alert and oriented x 3 PSYCHIATRIC:  Normal affect   ASSESSMENT:    1. Essential hypertension   2. Coronary artery disease involving native coronary artery of native heart with angina pectoris ([place])   3. Mixed hyperlipidemia    PLAN:    In order of problems listed above:  1. Blood pressure is well controlled.  We reviewed home readings and they are also in a good range.  We did not make any changes today. 2. The patient is stable with no symptoms of angina.  Her activity level is not as good as it is been in the past.  We discussed reinitiation of efforts towards an exercise program.  Antianginal program includes amlodipine and metoprolol. 3. Treated with Crestor 10 mg.  Recent lipids with total cholesterol 175, HDL 73, LDL 75.  Transaminases within normal limits.  Medication Adjustments/Labs and Tests Ordered: Current medicines are reviewed at length with the patient today.  Concerns regarding medicines are outlined above.  Orders Placed This Encounter  Procedures  . EKG 12-Lead   No orders of the defined types were placed in this encounter.   Patient Instructions  Medication  Instructions:  Your provider recommends that you continue on your current medications as directed. Please refer to the Current Medication list given to you today.    Labwork: None  Testing/Procedures: None  Follow-Up: Your provider wants you to follow-up in: 6 months with Dr. Burt Knack. You will receive a reminder letter in the mail two months in advance. If you don't receive a letter, please call our office to schedule the follow-up appointment.      Signed, [name], MD  08/21/2019 7:59 AM    [place]

## 2019-08-16 NOTE — Patient Instructions (Signed)
Medication Instructions:  Your provider recommends that you continue on your current medications as directed. Please refer to the Current Medication list given to you today.    Labwork: None  Testing/Procedures: None  Follow-Up: Your provider wants you to follow-up in: 6 months with Dr. Cooper. You will receive a reminder letter in the mail two months in advance. If you don't receive a letter, please call our office to schedule the follow-up appointment.    

## 2019-08-18 ENCOUNTER — Encounter (HOSPITAL_COMMUNITY): Payer: Self-pay

## 2019-08-18 NOTE — Telephone Encounter (Signed)
Carrianne called back to say that protein electrophoresis has already been done, it was done 08/07/19.

## 2019-08-21 ENCOUNTER — Telehealth: Payer: Self-pay | Admitting: Pulmonary Disease

## 2019-08-21 ENCOUNTER — Encounter (HOSPITAL_COMMUNITY): Payer: Self-pay

## 2019-08-21 DIAGNOSIS — J9611 Chronic respiratory failure with hypoxia: Secondary | ICD-10-CM

## 2019-08-21 NOTE — Telephone Encounter (Signed)
Spoke with patient. She is requesting her annual ONO on room air. She wants to make sure that [employer] will continue to pay for her O2 at night. She is also requesting to have the ONO done about 3 weeks before her appointment with RA to make sure that he has received the order.   Patient would like to follow up with RA in December.   RA, please advise if you are ok with [place] placing the order for the ONO and specifying when it needs to be done in the order. Thanks!

## 2019-08-23 ENCOUNTER — Encounter (HOSPITAL_COMMUNITY): Payer: Self-pay

## 2019-08-23 NOTE — Addendum Note (Signed)
Addended by: Eileen Stanford on: 08/23/2019 02:39 PM   Modules accepted: Orders

## 2019-08-23 NOTE — Telephone Encounter (Signed)
Okay to do so? 

## 2019-08-23 NOTE — Telephone Encounter (Signed)
Called and left detailed message for patient.  Order placed for ONO on room air.  Nothing further needed at this time.

## 2019-08-25 ENCOUNTER — Encounter (HOSPITAL_COMMUNITY): Payer: Self-pay

## 2019-08-30 ENCOUNTER — Encounter (HOSPITAL_COMMUNITY): Payer: Self-pay

## 2019-08-31 DIAGNOSIS — Z23 Encounter for immunization: Secondary | ICD-10-CM | POA: Diagnosis not present

## 2019-09-01 ENCOUNTER — Encounter (HOSPITAL_COMMUNITY): Payer: Self-pay

## 2019-09-02 ENCOUNTER — Other Ambulatory Visit: Payer: Self-pay | Admitting: Internal Medicine

## 2019-09-04 ENCOUNTER — Encounter (HOSPITAL_COMMUNITY): Payer: Self-pay

## 2019-09-06 ENCOUNTER — Encounter (HOSPITAL_COMMUNITY): Payer: Self-pay

## 2019-09-08 ENCOUNTER — Encounter (HOSPITAL_COMMUNITY): Payer: Self-pay

## 2019-09-11 ENCOUNTER — Encounter (HOSPITAL_COMMUNITY): Payer: Self-pay

## 2019-09-13 ENCOUNTER — Encounter (HOSPITAL_COMMUNITY): Payer: Self-pay

## 2019-09-15 ENCOUNTER — Encounter (HOSPITAL_COMMUNITY): Payer: Self-pay

## 2019-09-18 ENCOUNTER — Encounter (HOSPITAL_COMMUNITY): Payer: Self-pay

## 2019-09-20 ENCOUNTER — Encounter (HOSPITAL_COMMUNITY): Payer: Self-pay

## 2019-09-28 ENCOUNTER — Encounter: Payer: Self-pay | Admitting: Pulmonary Disease

## 2019-09-28 DIAGNOSIS — J449 Chronic obstructive pulmonary disease, unspecified: Secondary | ICD-10-CM | POA: Diagnosis not present

## 2019-09-28 DIAGNOSIS — R0902 Hypoxemia: Secondary | ICD-10-CM | POA: Diagnosis not present

## 2019-10-03 NOTE — Telephone Encounter (Signed)
ONO report received with start date of 09/28/19 placed in Dr. Bari Mantis look at folder for review.

## 2019-10-31 ENCOUNTER — Ambulatory Visit: Payer: Medicare Other | Admitting: Pulmonary Disease

## 2019-11-30 ENCOUNTER — Encounter: Payer: Self-pay | Admitting: Pulmonary Disease

## 2019-11-30 ENCOUNTER — Ambulatory Visit (INDEPENDENT_AMBULATORY_CARE_PROVIDER_SITE_OTHER): Payer: Medicare Other | Admitting: Pulmonary Disease

## 2019-11-30 ENCOUNTER — Other Ambulatory Visit: Payer: Self-pay

## 2019-11-30 DIAGNOSIS — J9611 Chronic respiratory failure with hypoxia: Secondary | ICD-10-CM | POA: Diagnosis not present

## 2019-11-30 DIAGNOSIS — I25119 Atherosclerotic heart disease of native coronary artery with unspecified angina pectoris: Secondary | ICD-10-CM | POA: Diagnosis not present

## 2019-11-30 NOTE — Assessment & Plan Note (Signed)
She continues to have oxygen desaturation during the night.  No overt cardiopulmonary disease is apparent She will continue nocturnal oxygen. We may consider repeat testing next year including home sleep test and perhaps high-resolution CT chest although I do not see any evidence of ILD or pulmonary hypertension at this time

## 2019-11-30 NOTE — Progress Notes (Signed)
   Subjective:    Patient ID: ASTRA SPARACO, female    DOB: Aug 25, 1941, 78 y.o.   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 Follow-up    Hypoxemic respiratory failure - still will get out of breath when going up stairs.   She has been checking her oxygen levels with her own oximeter and finds that this is low in the morning and seems to improve during the day.  Dyspnea has been stable. She has been masking and social distancing appropriately during the pandemic She denies cough, wheezing, stays active with her grandchildren No frequent chest colds, no witnessed apneas  Flu vaccine up-to-date  Significant tests/ events reviewed  ONO10/2020 >>desatn x 6h  PSG 04/2015 (dohmeier)-didnot show significant OSA -AHI 4.5, RDI 7.6/hwhich showed desaturation for 106 minutes.   PFT 7/2016Nl lung function. No airflow obstruction/restriction. DLCO nml .  CT angio chest7/2016neg for PE, mild emphysema .  Bubble echo with gg 1 DD , EF ok , no shunt.   Review of Systems Patient denies significant dyspnea,cough, hemoptysis,  chest pain, palpitations, pedal edema, orthopnea, paroxysmal nocturnal dyspnea, lightheadedness, nausea, vomiting, abdominal or  leg pains      Objective:   Physical Exam   Gen. Pleasant, well-nourished, in no distress ENT - no thrush, no pallor/icterus,no post nasal drip Neck: No JVD, no thyromegaly, no carotid bruits Lungs: no use of accessory muscles, no dullness to percussion, clear without rales or rhonchi  Cardiovascular: Rhythm regular, heart sounds  normal, no murmurs or gallops, no peripheral edema Musculoskeletal: No deformities, no cyanosis or clubbing         Assessment & Plan:

## 2019-11-30 NOTE — Patient Instructions (Signed)
Continue using oxygen during sleep We may consider a home sleep test next year to revisit the issue

## 2019-12-01 ENCOUNTER — Other Ambulatory Visit: Payer: Self-pay | Admitting: Cardiovascular Disease

## 2019-12-01 ENCOUNTER — Telehealth: Payer: Self-pay | Admitting: Cardiovascular Disease

## 2019-12-01 MED ORDER — NITROGLYCERIN 0.4 MG SL SUBL: 0.4000 mg | SUBLINGUAL_TABLET | SUBLINGUAL | 3 refills | Status: DC | PRN

## 2019-12-01 NOTE — Telephone Encounter (Signed)
Confirmed with patient she is doing fine. She understands she will be called to arrange appointment in February when she is due and an appointment is available.

## 2019-12-01 NOTE — Telephone Encounter (Signed)
I have Carrie Chung on the phone and she is scheduling an appt with Dr. Burt Knack and he is totally booked til April.....she states that she really needs to see him   Please call

## 2019-12-04 ENCOUNTER — Telehealth: Payer: Self-pay | Admitting: Internal Medicine

## 2019-12-04 MED ORDER — DESVENLAFAXINE SUCCINATE ER 50 MG PO TB24: ORAL_TABLET | ORAL | 3 refills | Status: DC

## 2019-12-04 NOTE — Telephone Encounter (Signed)
Received Fax RX request from  [facility] [place], [place] - [place] [address] AT [address] Phone:  [phone]  Fax:  [phone]       Medication - desvenlafaxine (PRISTIQ) 50 MG 24 hr tablet    Last Refill - 12/04/19  Last OV - 08/14/19  Last CPE - 08/14/19  Next Appointment -

## 2019-12-08 NOTE — Telephone Encounter (Signed)
Scheduled patient 03/07/20 with Dr. Burt Knack. She was grateful for assistance.

## 2019-12-12 ENCOUNTER — Telehealth (HOSPITAL_COMMUNITY): Payer: Self-pay | Admitting: *Deleted

## 2019-12-12 NOTE — Telephone Encounter (Signed)
Contacted patient to update status of return to exercise in the cardiac rehab maintenance program. Patient aware that we will be temporarily suspending onsite maintenance exercise classes and would like to be contacted once we resume.  Sol Passer, [place], ACSM CEP 12/12/2019 1431

## 2019-12-18 ENCOUNTER — Other Ambulatory Visit: Payer: Self-pay | Admitting: Cardiovascular Disease

## 2020-01-31 NOTE — Telephone Encounter (Signed)
Patient called back to say she is fine, and had no COVID contact.

## 2020-02-05 ENCOUNTER — Other Ambulatory Visit: Payer: Self-pay | Admitting: Cardiovascular Disease

## 2020-02-10 ENCOUNTER — Telehealth: Payer: Self-pay | Admitting: Internal Medicine

## 2020-02-10 ENCOUNTER — Encounter: Payer: Self-pay | Admitting: Internal Medicine

## 2020-02-10 DIAGNOSIS — G4709 Other insomnia: Secondary | ICD-10-CM

## 2020-02-10 MED ORDER — ALPRAZOLAM 1 MG PO TABS: 1.0000 mg | ORAL_TABLET | Freq: Every evening | ORAL | 0 refills | Status: DC | PRN

## 2020-02-10 NOTE — Telephone Encounter (Signed)
Patient requested refill on Xanax img at bedtime. Refill x 90 days.

## 2020-03-07 ENCOUNTER — Telehealth: Payer: Self-pay | Admitting: Internal Medicine

## 2020-03-07 ENCOUNTER — Other Ambulatory Visit: Payer: Self-pay

## 2020-03-07 ENCOUNTER — Ambulatory Visit (INDEPENDENT_AMBULATORY_CARE_PROVIDER_SITE_OTHER): Payer: Medicare Other | Admitting: Cardiovascular Disease

## 2020-03-07 ENCOUNTER — Encounter: Payer: Self-pay | Admitting: Cardiovascular Disease

## 2020-03-07 VITALS — BP 126/80 | HR 75 | Ht 61.5 in | Wt 156.0 lb

## 2020-03-07 DIAGNOSIS — I25119 Atherosclerotic heart disease of native coronary artery with unspecified angina pectoris: Secondary | ICD-10-CM | POA: Diagnosis not present

## 2020-03-07 DIAGNOSIS — E782 Mixed hyperlipidemia: Secondary | ICD-10-CM | POA: Diagnosis not present

## 2020-03-07 DIAGNOSIS — I10 Essential (primary) hypertension: Secondary | ICD-10-CM

## 2020-03-07 MED ORDER — ROSUVASTATIN CALCIUM 20 MG PO TABS: 20.0000 mg | ORAL_TABLET | Freq: Every day | ORAL | 3 refills | Status: DC

## 2020-03-07 NOTE — Telephone Encounter (Signed)
Received Fax RX request from  [facility] [place], [place] - [address] AT [address] Phone:  [phone]  Fax:  [phone]       Medication -  desvenlafaxine (PRISTIQ) 50 MG 24 hr tablet FOLBIC 2.5-25-2 MG TABS tablet  Last Refill - 12/04/19  Last OV - 08/14/19  Last CPE - 08/14/19  Next Appointment -

## 2020-03-07 NOTE — Patient Instructions (Signed)
Medication Instructions:  1) INCREASE CRESTOR to 20 mg daily *If you need a refill on your cardiac medications before your next appointment, please call your pharmacy*  Follow-Up: At [facility], you and your health needs are our priority.  As part of our continuing mission to provide you with exceptional heart care, we have created designated Provider Care Teams.  These Care Teams include your primary Cardiologist (physician) and Advanced Practice Providers (APPs -  Physician Assistants and Nurse Practitioners) who all work together to provide you with the care you need, when you need it. Your next appointment:   6 month(s) The format for your next appointment:   In Person Provider:   You may see [name], MD or one of the following Advanced Practice Providers on your designated Care Team:    Richardson Dopp, PA-C  Vin Lolita, [place]  Daune Perch, [place]

## 2020-03-07 NOTE — Progress Notes (Signed)
Cardiology Office Note:    Date:  03/11/2020   ID:  Carrie Chung, Carrie Chung [birth year], MRN [id number]  PCP:  [name], MD  Cardiologist:  [name], MD  Electrophysiologist:  None   Referring MD: [name], MD   Chief Complaint  Patient presents with  . Coronary Artery Disease    History of Present Illness:    Carrie Chung is a 79 y.o. female with a hx of coronary artery disease, presenting for follow-up evaluation.  The patient initially presented with non-ST elevation infarct in 2013.  She was found to have multivessel coronary artery disease and underwent multivessel stenting at that time.  She has been managed medically since then with no recurrent ischemic events.   The patient is here alone today. She is doing fairly well, babysitting her granddaughter regularly and this is keeping her active. She was previously engaged in the maintenance phase of cardiac rehab. Today, she denies symptoms of palpitations, chest pain, shortness of breath, orthopnea, PND, lower extremity edema, dizziness, or syncope.   Past Medical History:  Diagnosis Date  . Atrial tachycardia (HCC)    a. noted at cardiac rehab 5/13; event monitor ordered to assess for AFib  . CAD (coronary artery disease)    a. NSTEMI 03/2012 ([place] 03/27/12: pLAD 30%, mLAD 99%, mCFX 95%, mRCA 99% with thrombus, EF 65%);  s/p PTCA/DESx1 to prox-mid RCA 03/27/12 urgently in setting of hypotension/bradycardia, with staged PTCA/Evolve study stent to mid LAD & PTCA/Evolve study stent to prox LCx 03/29/12 ;   echo 03/28/12:EF 60%, Aortic sclerosis without AS, mild RVE, mild reduced RVSF    . Endometrial polyp   . HTN (hypertension)   . Hyperglycemia   . Hyperlipidemia   . Hypokalemia   . MI, old   . Sleep apnea     Past Surgical History:  Procedure Laterality Date  . APPENDECTOMY  79 years old  . CAROTID STENT  04/2012   x3  . CESAREAN SECTION  1984  . CHOLECYSTECTOMY  1978  . LEFT HEART CATHETERIZATION WITH  CORONARY ANGIOGRAM N/A 03/27/2012   Procedure: LEFT HEART CATHETERIZATION WITH CORONARY ANGIOGRAM;  Surgeon: Burnell Blanks, MD;  Location: [facility] CATH LAB;  Service: Cardiovascular;  Laterality: N/A;  . PERCUTANEOUS CORONARY STENT INTERVENTION (PCI-S) N/A 03/27/2012   Procedure: PERCUTANEOUS CORONARY STENT INTERVENTION (PCI-S);  Surgeon: Burnell Blanks, MD;  Location[facility] CATH LAB;  Service: Cardiovascular;  Laterality: N/A;  . PERCUTANEOUS CORONARY STENT INTERVENTION (PCI-S) N/A 03/29/2012   Procedure: PERCUTANEOUS CORONARY STENT INTERVENTION (PCI-S);  Surgeon: [name], MD;  Location: [facility] CATH LAB;  Service: Cardiovascular;  Laterality: N/A;  . TONSILLECTOMY  79 years old    Current Medications: Current Meds  Medication Sig  . ALPRAZolam (XANAX) 1 MG tablet Take 1 tablet (1 mg total) by mouth at bedtime as needed. for sleep  . amLODipine (NORVASC) 5 MG tablet TAKE 1 TABLET BY MOUTH EVERY DAY  . aspirin 81 MG tablet Take 1 tablet (81 mg total) by mouth daily.  . Calcium Carbonate-Vitamin D (CALTRATE 600+D) 600-400 MG-UNIT per tablet Take 1 tablet by mouth 2 (two) times daily.   . clopidogrel (PLAVIX) 75 MG tablet TAKE 1 TABLET(75 MG) BY MOUTH DAILY  . esomeprazole (NEXIUM) 40 MG capsule Take 40 mg by mouth daily at 12 noon.  . hydrochlorothiazide (HYDRODIURIL) 25 MG tablet TAKE 1 TABLET BY MOUTH EVERY DAY  . metoprolol tartrate (LOPRESSOR) 50 MG tablet TAKE 1 AND 1/2 TABLETS(75 MG)  BY MOUTH TWICE DAILY  . nitroGLYCERIN (NITROSTAT) 0.4 MG SL tablet Place 1 tablet (0.4 mg total) under the tongue every 5 (five) minutes as needed for chest pain.  . NON FORMULARY Oxygen 2L at night  . rosuvastatin (CRESTOR) 20 MG tablet Take 1 tablet (20 mg total) by mouth daily.  Marland Kitchen triamcinolone cream (KENALOG) 0.1 % Apply 1 application topically 3 (three) times daily.  . [DISCONTINUED] desvenlafaxine (PRISTIQ) 50 MG 24 hr tablet TAKE 1 TABLET(50 MG) BY MOUTH DAILY  . [DISCONTINUED] FOLBIC 2.5-25-2  MG TABS tablet TAKE 1 TABLET BY MOUTH DAILY  . [DISCONTINUED] rosuvastatin (CRESTOR) 10 MG tablet TAKE 1 TABLET BY MOUTH EVERY DAY     Allergies:   Penicillins, Sulfonamide derivatives, and Sulfamethoxazole   Social History   Socioeconomic History  . Marital status: Married    Spouse name: Not on file  . Number of children: 1  . Years of education: Coll +  . Highest education level: Not on file  Occupational History  . Occupation: Retired Marine scientist  Tobacco Use  . Smoking status: Former Smoker    Types: Cigarettes    Quit date: 04/19/1980    Years since quitting: 39.9  . Smokeless tobacco: Never Used  Substance and Sexual Activity  . Alcohol use: Yes    Alcohol/week: 20.0 standard drinks    Types: 20 Glasses of wine per week    Comment: socially  . Drug use: No  . Sexual activity: Never  Other Topics Concern  . Not on file  Social History Narrative   Caffeine 2 cups tea.   Social Determinants of Health   Financial Resource Strain:   . Difficulty of Paying Living Expenses:   Food Insecurity:   . Worried About Charity fundraiser in the Last Year:   . Arboriculturist in the Last Year:   Transportation Needs:   . Film/video editor (Medical):   Marland Kitchen Lack of Transportation (Non-Medical):   Physical Activity:   . Days of Exercise per Week:   . Minutes of Exercise per Session:   Stress:   . Feeling of Stress :   Social Connections:   . Frequency of Communication with Friends and Family:   . Frequency of Social Gatherings with Friends and Family:   . Attends Religious Services:   . Active Member of Clubs or Organizations:   . Attends Archivist Meetings:   Marland Kitchen Marital Status:      Family History: The patient's family history includes Cancer - Other in her father; Colon cancer in her father; Coronary artery disease in her mother.  ROS:   Please see the history of present illness.    All other systems reviewed and are negative.  EKGs/Labs/Other Studies  Reviewed:    EKG:  EKG is not ordered today.  Recent Labs: 08/07/2019: ALT 11; BUN 14; Creat 0.66; Hemoglobin 14.3; Platelets 168; Potassium 4.4; Sodium 144; TSH 2.37  Recent Lipid Panel    Component Value Date/Time   CHOL 175 08/07/2019 0937   TRIG 178 (H) 08/07/2019 0937   HDL 73 08/07/2019 0937   CHOLHDL 2.4 08/07/2019 0937   VLDL 41 (H) 03/25/2017 1102   LDLCALC 75 08/07/2019 0937   LDLDIRECT 152.4 03/09/2012 0900    Physical Exam:    VS:  BP 126/80   Pulse 75   Ht 5' 1.5" (1.562 m)   Wt 156 lb (70.8 kg)   SpO2 97%   BMI 29.00 kg/m  Wt Readings from Last 3 Encounters:  03/07/20 156 lb (70.8 kg)  11/30/19 156 lb (70.8 kg)  08/16/19 156 lb 12.8 oz (71.1 kg)     GEN:  Well nourished, well developed in no acute distress HEENT: Normal NECK: No JVD; No carotid bruits LYMPHATICS: No lymphadenopathy CARDIAC: RRR, no murmurs, rubs, gallops RESPIRATORY:  Clear to auscultation without rales, wheezing or rhonchi  ABDOMEN: Soft, non-tender, non-distended MUSCULOSKELETAL:  No edema; No deformity  SKIN: Warm and dry NEUROLOGIC:  Alert and oriented x 3 PSYCHIATRIC:  Normal affect   ASSESSMENT:    1. Essential hypertension   2. Coronary artery disease involving native coronary artery of native heart with angina pectoris ([place])   3. Mixed hyperlipidemia    PLAN:    In order of problems listed above:  1. Blood pressure is well controlled on amlodipine, hydrochlorothiazide, and metoprolol. 2. The patient is doing well with no symptoms of angina.  She has remained on long-term dual antiplatelet therapy after multivessel stenting.  She has had no bleeding problems.  Her antianginal program is reviewed and will be continued without change. 3. I reviewed her last lipids which show a cholesterol 175, HDL 73, LDL 75.  I have asked her to double her rosuvastatin dose to 20 mg daily in order to achieve an LDL cholesterol less than 70 mg/dL.  She will continue to work on lifestyle  modification and this was discussed today.   Medication Adjustments/Labs and Tests Ordered: Current medicines are reviewed at length with the patient today.  Concerns regarding medicines are outlined above.  No orders of the defined types were placed in this encounter.  Meds ordered this encounter  Medications  . rosuvastatin (CRESTOR) 20 MG tablet    Sig: Take 1 tablet (20 mg total) by mouth daily.    Dispense:  90 tablet    Refill:  3    Patient Instructions  Medication Instructions:  1) INCREASE CRESTOR to 20 mg daily *If you need a refill on your cardiac medications before your next appointment, please call your pharmacy*  Follow-Up: At [facility], you and your health needs are our priority.  As part of our continuing mission to provide you with exceptional heart care, we have created designated Provider Care Teams.  These Care Teams include your primary Cardiologist (physician) and Advanced Practice Providers (APPs -  Physician Assistants and Nurse Practitioners) who all work together to provide you with the care you need, when you need it. Your next appointment:   6 month(s) The format for your next appointment:   In Person Provider:   You may see [name], MD or one of the following Advanced Practice Providers on your designated Care Team:    Richardson Dopp, PA-C  Vin [place], PA-C  Daune Perch, [place]      Signed, [name], MD  03/11/2020 5:29 PM    [place]

## 2020-03-08 MED ORDER — FOLBIC 2.5-25-2 MG PO TABS: 1.0000 | ORAL_TABLET | Freq: Every day | ORAL | 3 refills | Status: DC

## 2020-03-08 MED ORDER — DESVENLAFAXINE SUCCINATE ER 50 MG PO TB24: ORAL_TABLET | ORAL | 3 refills | Status: DC

## 2020-03-08 NOTE — Addendum Note (Signed)
Addended by: Mady Haagensen on: 03/08/2020 10:52 AM   Modules accepted: Orders

## 2020-05-02 ENCOUNTER — Other Ambulatory Visit: Payer: Self-pay | Admitting: Cardiovascular Disease

## 2020-05-09 ENCOUNTER — Other Ambulatory Visit: Payer: Self-pay

## 2020-05-09 MED ORDER — ALPRAZOLAM 1 MG PO TABS: 1.0000 mg | ORAL_TABLET | Freq: Every evening | ORAL | 1 refills | Status: DC | PRN

## 2020-05-09 NOTE — Telephone Encounter (Signed)
Scheduled CPE in August.

## 2020-05-15 ENCOUNTER — Telehealth (HOSPITAL_COMMUNITY): Payer: Self-pay | Admitting: *Deleted

## 2020-05-24 ENCOUNTER — Telehealth (HOSPITAL_COMMUNITY): Payer: Self-pay | Admitting: Internal Medicine

## 2020-05-28 ENCOUNTER — Telehealth (HOSPITAL_COMMUNITY): Payer: Self-pay | Admitting: *Deleted

## 2020-05-29 ENCOUNTER — Other Ambulatory Visit: Payer: Self-pay | Admitting: Cardiovascular Disease

## 2020-08-16 ENCOUNTER — Other Ambulatory Visit: Payer: Medicare Other | Admitting: Internal Medicine

## 2020-08-16 ENCOUNTER — Other Ambulatory Visit: Payer: Self-pay

## 2020-08-16 DIAGNOSIS — G4709 Other insomnia: Secondary | ICD-10-CM | POA: Diagnosis not present

## 2020-08-16 DIAGNOSIS — E781 Pure hyperglyceridemia: Secondary | ICD-10-CM | POA: Diagnosis not present

## 2020-08-16 DIAGNOSIS — Z0000 Encounter for general adult medical examination without abnormal findings: Secondary | ICD-10-CM

## 2020-08-16 DIAGNOSIS — G4733 Obstructive sleep apnea (adult) (pediatric): Secondary | ICD-10-CM | POA: Diagnosis not present

## 2020-08-16 DIAGNOSIS — R7302 Impaired glucose tolerance (oral): Secondary | ICD-10-CM | POA: Diagnosis not present

## 2020-08-16 DIAGNOSIS — M48062 Spinal stenosis, lumbar region with neurogenic claudication: Secondary | ICD-10-CM

## 2020-08-16 DIAGNOSIS — I25119 Atherosclerotic heart disease of native coronary artery with unspecified angina pectoris: Secondary | ICD-10-CM

## 2020-08-17 LAB — COMPLETE METABOLIC PANEL WITH GFR
AG Ratio: 1.8 (calc) (ref 1.0–2.5)
ALT: 14 U/L (ref 6–29)
AST: 22 U/L (ref 10–35)
Albumin: 4.4 g/dL (ref 3.6–5.1)
Alkaline phosphatase (APISO): 73 U/L (ref 37–153)
BUN: 10 mg/dL (ref 7–25)
CO2: 34 mmol/L — ABNORMAL HIGH (ref 20–32)
Calcium: 9.8 mg/dL (ref 8.6–10.4)
Chloride: 97 mmol/L — ABNORMAL LOW (ref 98–110)
Creat: 0.82 mg/dL (ref 0.60–0.93)
GFR, Est African American: 79 mL/min/{1.73_m2} (ref >=60)
GFR, Est Non African American: 68 mL/min/{1.73_m2} (ref >=60)
Globulin: 2.5 g/dL (calc) (ref 1.9–3.7)
Glucose, Bld: 130 mg/dL — ABNORMAL HIGH (ref 65–99)
Potassium: 3.5 mmol/L (ref 3.5–5.3)
Sodium: 144 mmol/L (ref 135–146)
Total Bilirubin: 0.6 mg/dL (ref 0.2–1.2)
Total Protein: 6.9 g/dL (ref 6.1–8.1)

## 2020-08-17 LAB — CBC WITH DIFFERENTIAL/PLATELET
Absolute Monocytes: 396 cells/uL (ref 200–950)
Basophils Absolute: 18 cells/uL (ref 0–200)
Basophils Relative: 0.4 %
Eosinophils Absolute: 60 cells/uL (ref 15–500)
Eosinophils Relative: 1.3 %
HCT: 44.2 % (ref 35.0–45.0)
Hemoglobin: 14.7 g/dL (ref 11.7–15.5)
Lymphs Abs: 1191 cells/uL (ref 850–3900)
MCH: 31.4 pg (ref 27.0–33.0)
MCHC: 33.3 g/dL (ref 32.0–36.0)
MCV: 94.4 fL (ref 80.0–100.0)
MPV: 9.7 fL (ref 7.5–12.5)
Monocytes Relative: 8.6 %
Neutro Abs: 2935 cells/uL (ref 1500–7800)
Neutrophils Relative %: 63.8 %
Platelets: 157 10*3/uL (ref 140–400)
RBC: 4.68 10*6/uL (ref 3.80–5.10)
RDW: 12.6 % (ref 11.0–15.0)
Total Lymphocyte: 25.9 %
WBC: 4.6 10*3/uL (ref 3.8–10.8)

## 2020-08-17 LAB — HEMOGLOBIN A1C
Hgb A1c MFr Bld: 5.9 % of total Hgb — ABNORMAL HIGH (ref <5.7)
Mean Plasma Glucose: 123 (calc)
eAG (mmol/L): 6.8 (calc)

## 2020-08-17 LAB — LIPID PANEL
Cholesterol: 173 mg/dL (ref <200)
HDL: 86 mg/dL (ref >=50)
LDL Cholesterol (Calc): 61 mg/dL (calc)
Non-HDL Cholesterol (Calc): 87 mg/dL (calc) (ref <130)
Total CHOL/HDL Ratio: 2 (calc) (ref <5.0)
Triglycerides: 181 mg/dL — ABNORMAL HIGH (ref <150)

## 2020-08-17 LAB — TSH: TSH: 3.28 mIU/L (ref 0.40–4.50)

## 2020-08-19 ENCOUNTER — Encounter: Payer: Self-pay | Admitting: Internal Medicine

## 2020-08-19 ENCOUNTER — Ambulatory Visit (INDEPENDENT_AMBULATORY_CARE_PROVIDER_SITE_OTHER): Payer: Medicare Other | Admitting: Internal Medicine

## 2020-08-19 ENCOUNTER — Other Ambulatory Visit: Payer: Self-pay

## 2020-08-19 VITALS — BP 130/80 | HR 71 | Ht 61.0 in | Wt 157.0 lb

## 2020-08-19 DIAGNOSIS — R7302 Impaired glucose tolerance (oral): Secondary | ICD-10-CM | POA: Diagnosis not present

## 2020-08-19 DIAGNOSIS — Z0000 Encounter for general adult medical examination without abnormal findings: Secondary | ICD-10-CM

## 2020-08-19 DIAGNOSIS — F329 Major depressive disorder, single episode, unspecified: Secondary | ICD-10-CM | POA: Diagnosis not present

## 2020-08-19 DIAGNOSIS — F32A Depression, unspecified: Secondary | ICD-10-CM

## 2020-08-19 DIAGNOSIS — F419 Anxiety disorder, unspecified: Secondary | ICD-10-CM

## 2020-08-19 DIAGNOSIS — I25119 Atherosclerotic heart disease of native coronary artery with unspecified angina pectoris: Secondary | ICD-10-CM

## 2020-08-19 DIAGNOSIS — M48062 Spinal stenosis, lumbar region with neurogenic claudication: Secondary | ICD-10-CM

## 2020-08-19 DIAGNOSIS — G4733 Obstructive sleep apnea (adult) (pediatric): Secondary | ICD-10-CM | POA: Diagnosis not present

## 2020-08-19 DIAGNOSIS — G4709 Other insomnia: Secondary | ICD-10-CM | POA: Diagnosis not present

## 2020-08-19 DIAGNOSIS — I10 Essential (primary) hypertension: Secondary | ICD-10-CM

## 2020-08-19 DIAGNOSIS — E7849 Other hyperlipidemia: Secondary | ICD-10-CM | POA: Diagnosis not present

## 2020-08-19 DIAGNOSIS — I252 Old myocardial infarction: Secondary | ICD-10-CM

## 2020-08-27 ENCOUNTER — Other Ambulatory Visit: Payer: Self-pay | Admitting: Internal Medicine

## 2020-09-13 ENCOUNTER — Encounter: Payer: Self-pay | Admitting: Internal Medicine

## 2020-09-13 NOTE — Patient Instructions (Addendum)
Try to work on diet and exercise.  Follow-up here in 6 months with fasting lipid panel.  See Dr. Burt Knack for cardiac follow-up.  Consider counseling for depression.

## 2020-09-13 NOTE — Progress Notes (Signed)
Subjective:    Patient ID: Carrie Chung, female    DOB: Nov 28, 1941, 79 y.o.   MRN: [id number]  HPI 79 year old Female seen for Medicare wellness, health maintenance exam and evaluation of medical issues.  In April 2013 she presented to hospital with chest pain and was placed on IV nitroglycerin and IV heparin with plans of a possible heart cath the following day but she developed recurrent chest pain with diaphoresis, bradycardia in the 40s with hypotension and EKG changes.  She was taken emergently to the Cath Lab and underwent PTCA with drug-eluting stent x1 to the mid RCA.  She returned to the Cath Lab on April 9 and had stents placed in the mid LAD and proximal circumflex arteries.  Subsequently was enrolled in cardiac rehab and has done well since that time and is followed by Dr. Burt Knack.  She has a history of hypertension, anxiety depression, hyperlipidemia, insomnia and lumbar back pain.  History of obstructive sleep apnea, impaired glucose tolerance.  History of mild cognitive deficits followed by Neurology.  Longstanding history of insomnia.  History of GE reflux.  Patient is allergic to penicillin and tetracycline.  Has had adverse reactions to Rozerem, Ambien and trazodone.  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  Approximately 10-pack-year history of smoking before quitting in 1982.  History of oxygen desaturation during the night.  She uses nocturnal oxygen.  Is followed by Dr. Elsworth Soho.  Social history: Her husband is a retired Pension scheme manager.  She has an adult daughter and 2 grandchildren that live here in [place].  Social alcohol consumption.  Family history: Mother died at age 25 with history of cardiovascular disease.  Father died at age 18 of cancer of the colon.  1 daughter in good health.  2 sisters in good health.  Past medical history: Tonsillectomy and adenoidectomy, appendectomy, removal of Morton's neuroma.  Had herniated disc L3-L4-L5 in 1994 and has seen  Dr. Vertell Limber for back pain with radiculopathy.  Has had epidural steroid injections with good relief.  Has had tear of right supraspinatus muscle September 2003.  Right knee meniscal tear 1975.  Fractured pelvis 2001.  Cholecystectomy 1979.  C-section 1984.  In March 2018 she had a bout of right-sided back pain radiating into her right buttock.  History of MRI done in 2014 of the LS spine showed central disc herniation L 1-L2 indenting the thecal sac slightly.  At L3-L4 and has desiccation of disc and bulging Ms.  She responded to prednisone and improved but had a fall in February 2019 resulting in a closed compression fracture of L3.  MRI of the LS spine ordered by Dr. Vertell Limber March 2019 showed extruded disc fragment L1-L2 to the midline extending cranially unchanged from prior study.  Moderate to advanced disc disease with multiple bulging disc and chronic fracture of T12.  Severe spinal stenosis L3-L4 and L4-L5  History of trigger finger release by Dr. Fredna Dow.   Review of Systems back pain, not as much energy as she used to have.  Worried about her husband's health.     Objective:   Physical Exam Blood pressure 130/80, pulse 71, pulse oximetry 97% weight 157 pounds height 5 feet 1 inches BMI 29.66  Skin warm and dry.  No cervical adenopathy.  No thyromegaly.  No carotid bruits.  Chest clear to auscultation.  Breast without masses.  Cardiac exam regular rate and rhythm normal S1 and S2 without murmurs or gallops.  Abdomen soft nondistended without hepatosplenomegaly  masses or tenderness.  No lower extremity pitting edema.  No focal deficits on brief neurological exam.       Assessment & Plan:  Coronary artery disease followed by Dr. Burt Knack  Lumbar disc disease with history of compression fracture and spinal stenosis followed by Dr. Vertell Limber  Insomnia  Anxiety depression  History of sleep apnea/oxygen desaturation treated with CPAP  Essential hypertension  Hyperlipidemia treated with  statin medication.  Triglycerides elevated at 181.  Needs to watch diet.  May need higher dose of statin.  GE reflux treated with PPI  Obesity  Mild cognitive impairment followed by neurology  Impaired glucose tolerance-hemoglobin A1c 5.9%.  Fasting glucose 130. - Plan: Her triglycerides are elevated at 181.  She formerly was on Crestor 20 mg daily but it has been reduced to 10 mg daily.  Hypertension treated with HCTZ metoprolol and amlodipine  Anxiety treated with Xanax 1 mg at bedtime as needed for sleep  Coronary disease-is on Plavix  Depression treated with Pristiq-worried about her husband's health  GE reflux treated with Nexium  Plan: She will see Dr. Burt Knack in October for coronary evaluation.  She will follow-up with me in 6 months.  She needs to consider counseling for her depression and anxiety.  Subjective:   Patient presents for Medicare Annual/Subsequent preventive examination.  Review Past Medical/Family/Social: See above   Risk Factors  Current exercise habits: Not as much is should Dietary issues discussed: Low-fat low carbohydrate advised  Cardiac risk factors: Family history and personal history of coronary disease  Depression Screen  (Note: if answer to either of the following is "Yes", a more complete depression screening is indicated)   Over the past two weeks, have you felt down, depressed or hopeless?  Yes Over the past two weeks, have you felt little interest or pleasure in doing things?  Yes may be pandemic related and worried about my husband's health Have you lost interest or pleasure in daily life? No Do you often feel hopeless?  Sometimes Do you cry easily over simple problems? No   Activities of Daily Living  In your present state of health, do you have any difficulty performing the following activities?:   Driving? No  Managing money? No  Feeding yourself? No  Getting from bed to chair? No  Climbing a flight of stairs? No  Preparing  food and eating?: No  Bathing or showering? No  Getting dressed: No  Getting to the toilet? No  Using the toilet:No  Moving around from place to place: No  In the past year have you fallen or had a near fall?:No  Are you sexually active?  Yes Do you have more than one partner? No   Hearing Difficulties:  Do you often ask people to speak up or repeat themselves?  Sometimes Do you experience ringing or noises in your ears? No  Do you have difficulty understanding soft or whispered voices? No  Do you feel that you have a problem with memory? No Do you often misplace items? No    Home Safety:  Do you have a smoke alarm at your residence? Yes Do you have grab bars in the bathroom? Do you have throw rugs in your house? yes   Cognitive Testing  Alert? Yes Normal Appearance?Yes  Oriented to person? Yes Place? Yes  Time? Yes  Recall of three objects?  Not tested Can perform simple calculations? Yes  Displays appropriate judgment?Yes  Can read the correct time from a watch face?Yes  List the Names of Other Physician/Practitioners you currently use:  See referral list for the physicians patient is currently seeing.  Dr. Burt Knack   Review of Systems: See above   Objective:     General appearance: Appears stated age Head: Normocephalic, without obvious abnormality, atraumatic  Eyes: conj clear, EOMi PEERLA  Ears: normal TM's and external ear canals both ears  Nose: Nares normal. Septum midline. Mucosa normal. No drainage or sinus tenderness.  Throat: lips, mucosa, and tongue normal; teeth and gums normal  Neck: no adenopathy, no carotid bruit, no JVD, supple, symmetrical, trachea midline and thyroid not enlarged, symmetric, no tenderness/mass/nodules  No CVA tenderness.  Lungs: clear to auscultation bilaterally  Breasts: normal appearance, no masses or tenderness Heart: regular rate and rhythm, S1, S2 normal, no murmur, click, rub or gallop  Abdomen: soft, non-tender; bowel  sounds normal; no masses, no organomegaly  Musculoskeletal: ROM normal in all joints, no crepitus, no deformity, Normal muscle strengthen. Back  is symmetric, no curvature. Skin: Skin color, texture, turgor normal. No rashes or lesions  Lymph nodes: Cervical, supraclavicular, and axillary nodes normal.  Neurologic: CN 2 -12 Normal, Normal symmetric reflexes. Normal coordination and gait  Psych: Alert & Oriented x 3, Mood appear stable.    Assessment:    Annual wellness medicare exam   Plan:    During the course of the visit the patient was educated and counseled about appropriate screening and preventive services including:   Last mammogram on file was in 2016 at [place] scan was in 2018 at [place] showing mild osteopenia  Tetanus immunization is up-to-date.  Has had Prevnar 13 and pneumococcal 23 vaccines.  Advised annual flu vaccine.     Patient Instructions (the written plan) was given to the patient.  Medicare Attestation  I have personally reviewed:  The patient's medical and social history  Their use of alcohol, tobacco or illicit drugs  Their current medications and supplements  The patient's functional ability including ADLs,fall risks, home safety risks, cognitive, and hearing and visual impairment  Diet and physical activities  Evidence for depression or mood disorders  The patient's weight, height, BMI, and visual acuity have been recorded in the chart. I have made referrals, counseling, and provided education to the patient based on review of the above and I have provided the patient with a written personalized care plan for preventive services.

## 2020-09-19 DIAGNOSIS — Z23 Encounter for immunization: Secondary | ICD-10-CM | POA: Diagnosis not present

## 2020-09-22 ENCOUNTER — Other Ambulatory Visit: Payer: Self-pay | Admitting: Cardiovascular Disease

## 2020-10-02 ENCOUNTER — Ambulatory Visit (INDEPENDENT_AMBULATORY_CARE_PROVIDER_SITE_OTHER): Payer: Medicare Other | Admitting: Cardiovascular Disease

## 2020-10-02 ENCOUNTER — Encounter: Payer: Self-pay | Admitting: Cardiovascular Disease

## 2020-10-02 ENCOUNTER — Other Ambulatory Visit: Payer: Self-pay

## 2020-10-02 VITALS — BP 130/70 | HR 76 | Ht 61.0 in | Wt 160.0 lb

## 2020-10-02 DIAGNOSIS — E782 Mixed hyperlipidemia: Secondary | ICD-10-CM | POA: Diagnosis not present

## 2020-10-02 DIAGNOSIS — I25119 Atherosclerotic heart disease of native coronary artery with unspecified angina pectoris: Secondary | ICD-10-CM | POA: Diagnosis not present

## 2020-10-02 DIAGNOSIS — I10 Essential (primary) hypertension: Secondary | ICD-10-CM

## 2020-10-02 NOTE — Progress Notes (Signed)
Cardiology Office Note:    Date:  10/03/2020   ID:  Carrie Chung, DOB 04-09-1941, MRN [id number]  PCP:  [name], MD  [facility] Cardiologist:  [name], MD  [place] Electrophysiologist:  None   Referring MD: [name], MD   Chief Complaint  Patient presents with  . Coronary Artery Disease    History of Present Illness:    Carrie Chung is a 79 y.o. female with a hx of coronary artery disease, presenting for follow-up evaluation. The patient initially presented with non-ST elevation infarct in 2013. She was found to have multivessel coronary artery disease and underwent multivessel stenting at that time. She has been managed medically since then with no recurrent ischemic events.   The patient is here alone today.  She has been having some episodes of chest discomfort that occur with emotional stress.  She has been under significantly more stress at home with issues around her husband's health.  She has not had exertional chest pain or pressure.  Mild dyspnea is unchanged.  She denies orthopnea, PND, or heart palpitations.  She does feel her heart beating harder at times of stress.  This can be an uncomfortable feeling for her.  Past Medical History:  Diagnosis Date  . Atrial tachycardia (HCC)    a. noted at cardiac rehab 5/13; event monitor ordered to assess for AFib  . CAD (coronary artery disease)    a. NSTEMI 03/2012 ([place] 03/27/12: pLAD 30%, mLAD 99%, mCFX 95%, mRCA 99% with thrombus, EF 65%);  s/p PTCA/DESx1 to prox-mid RCA 03/27/12 urgently in setting of hypotension/bradycardia, with staged PTCA/Evolve study stent to mid LAD & PTCA/Evolve study stent to prox LCx 03/29/12 ;   echo 03/28/12:EF 60%, Aortic sclerosis without AS, mild RVE, mild reduced RVSF    . Endometrial polyp   . HTN (hypertension)   . Hyperlipidemia   . MI, old   . Sleep apnea     Past Surgical History:  Procedure Laterality Date  . APPENDECTOMY  79 years old  . CAROTID  STENT  04/2012   x3  . CESAREAN SECTION  1984  . CHOLECYSTECTOMY  1978  . LEFT HEART CATHETERIZATION WITH CORONARY ANGIOGRAM N/A 03/27/2012   Procedure: LEFT HEART CATHETERIZATION WITH CORONARY ANGIOGRAM;  Surgeon: Burnell Blanks, MD;  Location: [facility] CATH LAB;  Service: Cardiovascular;  Laterality: N/A;  . PERCUTANEOUS CORONARY STENT INTERVENTION (PCI-S) N/A 03/27/2012   Procedure: PERCUTANEOUS CORONARY STENT INTERVENTION (PCI-S);  Surgeon: Burnell Blanks, MD;  Location: [facility] CATH LAB;  Service: Cardiovascular;  Laterality: N/A;  . PERCUTANEOUS CORONARY STENT INTERVENTION (PCI-S) N/A 03/29/2012   Procedure: PERCUTANEOUS CORONARY STENT INTERVENTION (PCI-S);  Surgeon: [name], MD;  Location: [facility] CATH LAB;  Service: Cardiovascular;  Laterality: N/A;  . TONSILLECTOMY  79 years old    Current Medications: Current Meds  Medication Sig  . ALPRAZolam (XANAX) 1 MG tablet Take 1 tablet (1 mg total) by mouth at bedtime as needed. for sleep  . amLODipine (NORVASC) 5 MG tablet TAKE 1 TABLET BY MOUTH EVERY DAY  . aspirin 81 MG tablet Take 1 tablet (81 mg total) by mouth daily.  . Calcium Carbonate-Vitamin D (CALTRATE 600+D) 600-400 MG-UNIT per tablet Take 1 tablet by mouth daily.   . clopidogrel (PLAVIX) 75 MG tablet TAKE 1 TABLET(75 MG) BY MOUTH DAILY  . desvenlafaxine (PRISTIQ) 50 MG 24 hr tablet TAKE 1 TABLET(50 MG) BY MOUTH DAILY  . esomeprazole (NEXIUM) 40 MG capsule Take 40  mg by mouth daily at 12 noon.  . folic acid-pyridoxine-cyancobalamin (FOLBIC) 2.5-25-2 MG TABS tablet Take 1 tablet by mouth daily.  . hydrochlorothiazide (HYDRODIURIL) 25 MG tablet TAKE 1 TABLET BY MOUTH EVERY DAY  . metoprolol tartrate (LOPRESSOR) 50 MG tablet TAKE 1 AND 1/2 TABLETS(75 MG) BY MOUTH TWICE DAILY  . nitroGLYCERIN (NITROSTAT) 0.4 MG SL tablet Place 1 tablet (0.4 mg total) under the tongue every 5 (five) minutes as needed for chest pain.  . NON FORMULARY Oxygen 2L at night  . rosuvastatin (CRESTOR) 20  MG tablet Take 1 tablet (20 mg total) by mouth daily.  Marland Kitchen triamcinolone cream (KENALOG) 0.1 % Apply 1 application topically 3 (three) times daily. (Patient taking differently: Apply 1 application topically as needed. )     Allergies:   Penicillins, Sulfonamide derivatives, and Sulfamethoxazole   Social History   Socioeconomic History  . Marital status: Married    Spouse name: Not on file  . Number of children: 1  . Years of education: Coll +  . Highest education level: Not on file  Occupational History  . Occupation: Retired Marine scientist  Tobacco Use  . Smoking status: Former Smoker    Types: Cigarettes    Quit date: 04/19/1980    Years since quitting: 40.4  . Smokeless tobacco: Never Used  Substance and Sexual Activity  . Alcohol use: Yes    Alcohol/week: 20.0 standard drinks    Types: 20 Glasses of wine per week    Comment: socially  . Drug use: No  . Sexual activity: Never  Other Topics Concern  . Not on file  Social History Narrative   Caffeine 2 cups tea.   Social Determinants of Health   Financial Resource Strain:   . Difficulty of Paying Living Expenses: Not on file  Food Insecurity:   . Worried About Charity fundraiser in the Last Year: Not on file  . Ran Out of Food in the Last Year: Not on file  Transportation Needs:   . Lack of Transportation (Medical): Not on file  . Lack of Transportation (Non-Medical): Not on file  Physical Activity:   . Days of Exercise per Week: Not on file  . Minutes of Exercise per Session: Not on file  Stress:   . Feeling of Stress : Not on file  Social Connections:   . Frequency of Communication with Friends and Family: Not on file  . Frequency of Social Gatherings with Friends and Family: Not on file  . Attends Religious Services: Not on file  . Active Member of Clubs or Organizations: Not on file  . Attends Archivist Meetings: Not on file  . Marital Status: Not on file     Family History: The patient's family history  includes Cancer - Other in her father; Colon cancer in her father; Coronary artery disease in her mother.  ROS:   Please see the history of present illness.    All other systems reviewed and are negative.  EKGs/Labs/Other Studies Reviewed:    The following studies were reviewed today: Echo 06-19-2015: Study Conclusions   - Left ventricle: The cavity size was normal. Wall thickness was  normal. Systolic function was normal. The estimated ejection  fraction was in the range of 60% to 65%. Wall motion was normal;  there were no regional wall motion abnormalities. Doppler  parameters are consistent with abnormal left ventricular  relaxation (grade 1 diastolic dysfunction).  - Mitral valve: Calcified annulus. Normal thickness leaflets .  -  Atrial septum: No defect or patent foramen ovale was identified.  Echo contrast study showed no right-to-left atrial level shunt,  at baseline or with provocation.   EKG:  EKG is ordered today.  The ekg ordered today demonstrates NSR 67 bpm, T wave abnormality consider lateral ischemia, no significant change from previous tracings  Recent Labs: 08/16/2020: ALT 14; BUN 10; Creat 0.82; Hemoglobin 14.7; Platelets 157; Potassium 3.5; Sodium 144; TSH 3.28  Recent Lipid Panel    Component Value Date/Time   CHOL 173 08/16/2020 1156   TRIG 181 (H) 08/16/2020 1156   HDL 86 08/16/2020 1156   CHOLHDL 2.0 08/16/2020 1156   VLDL 41 (H) 03/25/2017 1102   LDLCALC 61 08/16/2020 1156   LDLDIRECT 152.4 03/09/2012 0900     Risk Assessment/Calculations:       Physical Exam:    VS:  BP 130/70   Pulse 76   Ht 5\' 1"  (1.549 m)   Wt 160 lb (72.6 kg)   SpO2 96%   BMI 30.23 kg/m     Wt Readings from Last 3 Encounters:  10/02/20 160 lb (72.6 kg)  08/19/20 157 lb (71.2 kg)  03/07/20 156 lb (70.8 kg)     GEN:  Well nourished, well developed in no acute distress HEENT: Normal NECK: No JVD; No carotid bruits LYMPHATICS: No  lymphadenopathy CARDIAC: RRR, no murmurs, rubs, gallops RESPIRATORY:  Clear to auscultation without rales, wheezing or rhonchi  ABDOMEN: Soft, non-tender, non-distended MUSCULOSKELETAL:  No edema; No deformity  SKIN: Warm and dry NEUROLOGIC:  Alert and oriented x 3 PSYCHIATRIC:  Normal affect   ASSESSMENT:    1. Coronary artery disease involving native coronary artery of native heart with angina pectoris ([place])   2. Mixed hyperlipidemia   3. Essential hypertension    PLAN:    In order of problems listed above:  1. The patient has known three-vessel coronary artery disease with history of multivessel PCI.  She is experiencing some increase in symptoms related to personal stressors.  It has been 5 years since any functional studies have been done.  I have recommended a Lexiscan Myoview stress perfusion study for further risk stratification.  She will continue on her current medical therapy at this time. 2. Recent lipids reviewed with LDL cholesterol 61 mg/dL.  Continue high intensity statin therapy. 3. Blood pressure is well controlled on current medical therapy.  Continue metoprolol and amlodipine.  Most recent creatinine and electrolytes reviewed.   Medication Adjustments/Labs and Tests Ordered: Current medicines are reviewed at length with the patient today.  Concerns regarding medicines are outlined above.  Orders Placed This Encounter  Procedures  . MYOCARDIAL PERFUSION IMAGING  . EKG 12-Lead   No orders of the defined types were placed in this encounter.   Patient Instructions  Medication Instructions:  Your provider recommends that you continue on your current medications as directed. Please refer to the Current Medication list given to you today.   *If you need a refill on your cardiac medications before your next appointment, please call your pharmacy*  Testing/Procedures: Your provider has requested that you have a lexiscan myoview. For further information please visit  [email or web address]. Please follow instruction sheet, as given.  Follow-Up: At [facility], you and your health needs are our priority.  As part of our continuing mission to provide you with exceptional heart care, we have created designated Provider Care Teams.  These Care Teams include your primary Cardiologist (physician) and Advanced Practice Providers (APPs -  Physician  Assistants and Nurse Practitioners) who all work together to provide you with the care you need, when you need it. Your next appointment:   6 month(s) The format for your next appointment:   In Person Provider:   You may see [name], MD or one of the following Advanced Practice Providers on your designated Care Team:    Richardson Dopp, PA-C  Robbie Lis, [place]      Signed, [name], MD  10/03/2020 2:56 PM    [place]

## 2020-10-02 NOTE — Patient Instructions (Signed)
Medication Instructions:  Your provider recommends that you continue on your current medications as directed. Please refer to the Current Medication list given to you today.   *If you need a refill on your cardiac medications before your next appointment, please call your pharmacy*  Testing/Procedures: Your provider has requested that you have a lexiscan myoview. For further information please visit [email or web address]. Please follow instruction sheet, as given.  Follow-Up: At [facility], you and your health needs are our priority.  As part of our continuing mission to provide you with exceptional heart care, we have created designated Provider Care Teams.  These Care Teams include your primary Cardiologist (physician) and Advanced Practice Providers (APPs -  Physician Assistants and Nurse Practitioners) who all work together to provide you with the care you need, when you need it. Your next appointment:   6 month(s) The format for your next appointment:   In Person Provider:   You may see Michael Cooper, MD or one of the following Advanced Practice Providers on your designated Care Team:    Scott Weaver, PA-C  Vin Bhagat, PA-C   

## 2020-10-10 ENCOUNTER — Ambulatory Visit: Payer: Medicare Other | Admitting: Pulmonary Disease

## 2020-10-11 ENCOUNTER — Encounter (HOSPITAL_COMMUNITY): Payer: Medicare Other

## 2020-10-14 ENCOUNTER — Telehealth: Payer: Self-pay | Admitting: Pulmonary Disease

## 2020-10-14 DIAGNOSIS — J9611 Chronic respiratory failure with hypoxia: Secondary | ICD-10-CM

## 2020-10-14 NOTE — Telephone Encounter (Signed)
Called and spoke with pt who stated she has scheduled a f/u with Dr. Elsworth Soho 12/2. Pt said she needs to have ONO performed prior to that appt so that way results will be able to be back in time for her appt so RA can go over the results with her.  Dr. Elsworth Soho, please advise.

## 2020-10-15 ENCOUNTER — Other Ambulatory Visit: Payer: Self-pay | Admitting: Internal Medicine

## 2020-10-15 NOTE — Telephone Encounter (Signed)
ONO order placed and I spoke with the pt and notified.

## 2020-10-15 NOTE — Telephone Encounter (Signed)
Pl order ONO on RA in nov prior to appt

## 2020-10-24 DIAGNOSIS — Z23 Encounter for immunization: Secondary | ICD-10-CM | POA: Diagnosis not present

## 2020-11-04 DIAGNOSIS — M1811 Unilateral primary osteoarthritis of first carpometacarpal joint, right hand: Secondary | ICD-10-CM | POA: Diagnosis not present

## 2020-11-04 DIAGNOSIS — M65341 Trigger finger, right ring finger: Secondary | ICD-10-CM | POA: Diagnosis not present

## 2020-11-04 DIAGNOSIS — M19041 Primary osteoarthritis, right hand: Secondary | ICD-10-CM | POA: Diagnosis not present

## 2020-11-06 ENCOUNTER — Encounter: Payer: Self-pay | Admitting: Pulmonary Disease

## 2020-11-06 ENCOUNTER — Telehealth (HOSPITAL_COMMUNITY): Payer: Self-pay | Admitting: *Deleted

## 2020-11-06 DIAGNOSIS — G4730 Sleep apnea, unspecified: Secondary | ICD-10-CM | POA: Diagnosis not present

## 2020-11-06 DIAGNOSIS — R0683 Snoring: Secondary | ICD-10-CM | POA: Diagnosis not present

## 2020-11-06 NOTE — Telephone Encounter (Signed)
Left message on voicemail per DPR in reference to upcoming appointment scheduled on 11/11/20 at 10:30 with detailed instructions given per Myocardial Perfusion Study Information Sheet for the test. LM to arrive 15 minutes early, and that it is imperative to arrive on time for appointment to keep from having the test rescheduled. If you need to cancel or reschedule your appointment, please call the office within 24 hours of your appointment. Failure to do so may result in a cancellation of your appointment, and a $50 no show fee. Phone number given for call back for any questions.

## 2020-11-11 ENCOUNTER — Encounter (HOSPITAL_COMMUNITY): Payer: Medicare Other

## 2020-11-18 ENCOUNTER — Telehealth (HOSPITAL_COMMUNITY): Payer: Self-pay | Admitting: *Deleted

## 2020-11-18 NOTE — Telephone Encounter (Signed)
Patient given detailed instructions per Myocardial Perfusion Study Information Sheet for the test on 11/22/20 at 10:00. Patient notified to arrive 15 minutes early and that it is imperative to arrive on time for appointment to keep from having the test rescheduled.  If you need to cancel or reschedule your appointment, please call the office within 24 hours of your appointment. . Patient verbalized understanding.Carrie Chung

## 2020-11-21 ENCOUNTER — Ambulatory Visit (INDEPENDENT_AMBULATORY_CARE_PROVIDER_SITE_OTHER): Payer: Medicare Other | Admitting: Pulmonary Disease

## 2020-11-21 ENCOUNTER — Ambulatory Visit (INDEPENDENT_AMBULATORY_CARE_PROVIDER_SITE_OTHER): Payer: Medicare Other

## 2020-11-21 ENCOUNTER — Other Ambulatory Visit: Payer: Self-pay

## 2020-11-21 ENCOUNTER — Encounter: Payer: Self-pay | Admitting: Pulmonary Disease

## 2020-11-21 VITALS — BP 142/78 | HR 75 | Temp 97.9°F | Ht 61.5 in | Wt 155.0 lb

## 2020-11-21 DIAGNOSIS — R0602 Shortness of breath: Secondary | ICD-10-CM | POA: Diagnosis not present

## 2020-11-21 DIAGNOSIS — J9611 Chronic respiratory failure with hypoxia: Secondary | ICD-10-CM | POA: Diagnosis not present

## 2020-11-21 DIAGNOSIS — R0600 Dyspnea, unspecified: Secondary | ICD-10-CM | POA: Diagnosis not present

## 2020-11-21 DIAGNOSIS — I25119 Atherosclerotic heart disease of native coronary artery with unspecified angina pectoris: Secondary | ICD-10-CM | POA: Diagnosis not present

## 2020-11-21 NOTE — Assessment & Plan Note (Addendum)
We discussed possibly repeating her lung function testing home sleep test But after going through possibilities, decided to hold off.  She will need nocturnal oxygen regardless and she is not interested in CPAP use. We will obtain chest x-ray today but there does not seem to be any indication of worsening lung disease Chest x-ray independently reviewed which does not show any evidence of ILD or other cause of hypoxia

## 2020-11-21 NOTE — Patient Instructions (Signed)
CXR today Continue on oxygen

## 2020-11-21 NOTE — Progress Notes (Signed)
   Subjective:    Patient ID: Carrie Chung, female    DOB: [birth year], 79 y.o.   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  96% on arrival today.  No new respiratory symptoms. Her husband has retired. ONO showed desaturation 52 minutes less than 88% in 3 hours less than 89%, but she did not sleep well at night and was mostly awake she reports.    Significant tests/ events reviewed  ONO10/2020 >>desatn x 6h  PSG 04/2015 (dohmeier)-didnot show significant OSA -AHI 4.5, RDI 7.6/hwhich showed desaturation for 106 minutes.   PFT7/2016Nl lung function. No airflow obstruction/restriction. DLCO nml .  CT angio chest7/2016neg for PE, mild emphysema .  Bubble echo with gg 1 DD , EF ok , no shunt.  Review of Systems neg for any significant sore throat, dysphagia, itching, sneezing, nasal congestion or excess/ purulent secretions, fever, chills, sweats, unintended wt loss, pleuritic or exertional cp, hempoptysis, orthopnea pnd or change in chronic leg swelling. Also denies presyncope, palpitations, heartburn, abdominal pain, nausea, vomiting, diarrhea or change in bowel or urinary habits, dysuria,hematuria, rash, arthralgias, visual complaints, headache, numbness weakness or ataxia.     Objective:   Physical Exam  Gen. Pleasant, well-nourished, in no distress ENT - no thrush, no pallor/icterus,no post nasal drip Neck: No JVD, no thyromegaly, no carotid bruits Lungs: no use of accessory muscles, no dullness to percussion, clear without rales or rhonchi  Cardiovascular: Rhythm regular, heart sounds  normal, no murmurs or gallops, no peripheral edema Musculoskeletal: No deformities, no cyanosis or clubbing        Assessment &  Plan:

## 2020-11-22 ENCOUNTER — Ambulatory Visit (HOSPITAL_COMMUNITY): Payer: Medicare Other | Attending: Cardiovascular Disease

## 2020-11-22 DIAGNOSIS — I25119 Atherosclerotic heart disease of native coronary artery with unspecified angina pectoris: Secondary | ICD-10-CM | POA: Insufficient documentation

## 2020-11-22 LAB — MYOCARDIAL PERFUSION IMAGING
LV dias vol: 50 mL (ref 46–106)
LV sys vol: 13 mL
Peak HR: 85 {beats}/min
Rest HR: 65 {beats}/min
SDS: 2
SRS: 0
SSS: 2
TID: 0.98

## 2020-11-22 MED ORDER — TECHNETIUM TC 99M TETROFOSMIN IV KIT
31.7000 | PACK | Freq: Once | INTRAVENOUS | Status: AC | PRN
  Administered 2020-11-22: 31.7 via INTRAVENOUS
  Filled 2020-11-22: qty 32

## 2020-11-22 MED ORDER — TECHNETIUM TC 99M TETROFOSMIN IV KIT
10.4000 | PACK | Freq: Once | INTRAVENOUS | Status: AC | PRN
  Administered 2020-11-22: 10.4 via INTRAVENOUS
  Filled 2020-11-22: qty 11

## 2020-11-22 MED ORDER — REGADENOSON 0.4 MG/5ML IV SOLN
0.4000 mg | Freq: Once | INTRAVENOUS | Status: AC
  Administered 2020-11-22: 0.4 mg via INTRAVENOUS

## 2020-11-22 NOTE — Progress Notes (Signed)
Routed to normal result pool.

## 2020-11-22 NOTE — Assessment & Plan Note (Signed)
No evidence of airway obstruction. Related to some deconditioning. Encourage her to be active

## 2020-12-02 DIAGNOSIS — M65341 Trigger finger, right ring finger: Secondary | ICD-10-CM | POA: Diagnosis not present

## 2020-12-21 ENCOUNTER — Other Ambulatory Visit: Payer: Self-pay | Admitting: Cardiovascular Disease

## 2020-12-24 ENCOUNTER — Telehealth: Payer: Self-pay | Admitting: Cardiovascular Disease

## 2020-12-24 MED ORDER — METOPROLOL TARTRATE 50 MG PO TABS
ORAL_TABLET | ORAL | 2 refills | Status: DC
Start: 2020-12-24 — End: 2021-06-24

## 2020-12-24 NOTE — Telephone Encounter (Signed)
Pt's medication was sent to pt's pharmacy as requested. Confirmation received.  °

## 2020-12-24 NOTE — Telephone Encounter (Signed)
*  STAT* If patient is at the pharmacy, call can be transferred to refill team.   1. Which medications need to be refilled? (please list name of each medication and dose if known) metoprolol tartrate (LOPRESSOR) 50 MG tablet  2. Which pharmacy/location (including street and city if local pharmacy) is medication to be sent to? [id number] - [place], [place] - [address]  3. Do they need a 30 day or 90 day supply? 90 day

## 2021-01-03 DIAGNOSIS — Z1159 Encounter for screening for other viral diseases: Secondary | ICD-10-CM | POA: Diagnosis not present

## 2021-02-04 ENCOUNTER — Other Ambulatory Visit: Payer: Self-pay | Admitting: Internal Medicine

## 2021-02-18 ENCOUNTER — Other Ambulatory Visit: Payer: Medicare Other | Admitting: Internal Medicine

## 2021-02-18 ENCOUNTER — Other Ambulatory Visit: Payer: Self-pay

## 2021-02-18 DIAGNOSIS — E7849 Other hyperlipidemia: Secondary | ICD-10-CM

## 2021-02-18 DIAGNOSIS — I10 Essential (primary) hypertension: Secondary | ICD-10-CM | POA: Diagnosis not present

## 2021-02-18 DIAGNOSIS — R7302 Impaired glucose tolerance (oral): Secondary | ICD-10-CM | POA: Diagnosis not present

## 2021-02-19 LAB — HEMOGLOBIN A1C
Hgb A1c MFr Bld: 6.2 % of total Hgb — ABNORMAL HIGH (ref <5.7)
Mean Plasma Glucose: 131 mg/dL
eAG (mmol/L): 7.3 mmol/L

## 2021-02-19 LAB — HEPATIC FUNCTION PANEL
AG Ratio: 1.4 (calc) (ref 1.0–2.5)
ALT: 13 U/L (ref 6–29)
AST: 20 U/L (ref 10–35)
Albumin: 4 g/dL (ref 3.6–5.1)
Alkaline phosphatase (APISO): 69 U/L (ref 37–153)
Bilirubin, Direct: 0.1 mg/dL (ref 0.0–0.2)
Globulin: 2.9 g/dL (calc) (ref 1.9–3.7)
Indirect Bilirubin: 0.3 mg/dL (calc) (ref 0.2–1.2)
Total Bilirubin: 0.4 mg/dL (ref 0.2–1.2)
Total Protein: 6.9 g/dL (ref 6.1–8.1)

## 2021-02-19 LAB — LIPID PANEL
Cholesterol: 173 mg/dL (ref <200)
HDL: 73 mg/dL (ref >=50)
LDL Cholesterol (Calc): 75 mg/dL (calc)
Non-HDL Cholesterol (Calc): 100 mg/dL (calc) (ref <130)
Total CHOL/HDL Ratio: 2.4 (calc) (ref <5.0)
Triglycerides: 152 mg/dL — ABNORMAL HIGH (ref <150)

## 2021-02-20 ENCOUNTER — Other Ambulatory Visit: Payer: Self-pay

## 2021-02-20 ENCOUNTER — Ambulatory Visit (INDEPENDENT_AMBULATORY_CARE_PROVIDER_SITE_OTHER): Payer: Medicare Other | Admitting: Internal Medicine

## 2021-02-20 ENCOUNTER — Encounter: Payer: Self-pay | Admitting: Internal Medicine

## 2021-02-20 VITALS — BP 120/80 | HR 76 | Ht 61.0 in | Wt 160.0 lb

## 2021-02-20 DIAGNOSIS — R7302 Impaired glucose tolerance (oral): Secondary | ICD-10-CM

## 2021-02-20 DIAGNOSIS — F32A Depression, unspecified: Secondary | ICD-10-CM | POA: Diagnosis not present

## 2021-02-20 DIAGNOSIS — I25119 Atherosclerotic heart disease of native coronary artery with unspecified angina pectoris: Secondary | ICD-10-CM | POA: Diagnosis not present

## 2021-02-20 DIAGNOSIS — Z8679 Personal history of other diseases of the circulatory system: Secondary | ICD-10-CM | POA: Diagnosis not present

## 2021-02-20 DIAGNOSIS — I252 Old myocardial infarction: Secondary | ICD-10-CM | POA: Diagnosis not present

## 2021-02-20 DIAGNOSIS — E7849 Other hyperlipidemia: Secondary | ICD-10-CM | POA: Diagnosis not present

## 2021-02-20 DIAGNOSIS — I10 Essential (primary) hypertension: Secondary | ICD-10-CM | POA: Diagnosis not present

## 2021-02-20 DIAGNOSIS — F419 Anxiety disorder, unspecified: Secondary | ICD-10-CM | POA: Diagnosis not present

## 2021-02-20 DIAGNOSIS — G4733 Obstructive sleep apnea (adult) (pediatric): Secondary | ICD-10-CM | POA: Diagnosis not present

## 2021-02-20 DIAGNOSIS — Z7901 Long term (current) use of anticoagulants: Secondary | ICD-10-CM | POA: Diagnosis not present

## 2021-02-20 DIAGNOSIS — K219 Gastro-esophageal reflux disease without esophagitis: Secondary | ICD-10-CM

## 2021-02-20 DIAGNOSIS — F439 Reaction to severe stress, unspecified: Secondary | ICD-10-CM | POA: Diagnosis not present

## 2021-02-20 NOTE — Progress Notes (Signed)
   Subjective:    Patient ID: Carrie Chung, female    DOB: 27-Jul-1941, 80 y.o.   MRN: [id number]  HPI 80 year old Female seen for  6 month follow up.  Situational stress with husband's health.  She has a history of coronary artery disease status post PTCA with drug-eluting stent x1 to the mid RCA in April 2013.  She returned to the Cath Lab and had stents placed in the mid LAD and proximal circumflex arteries.  She is followed by Dr. Burt Knack.  History of hypertension, anxiety, depression, hyperlipidemia, insomnia and lumbar back pain.  History of impaired glucose tolerance and obstructive sleep apnea.  History of mild cognitive deficits followed by neurology.  Longstanding history of insomnia.  History of GE reflux.  Quit smoking in 1982.  History of oxygen desaturation during the night and uses nocturnal oxygen.  Social history: Her husband is a retired Pension scheme manager.  She has an adult daughter and 2 grandchildren that live here in [place].  Social alcohol consumption.  Husband's health is not good at present time.  He may have some memory deficits.  She is quite worried about him.  Enjoys her grandchildren.    Review of Systems see above main issue I think is situational stress with husband     Objective:   Physical Exam   Blood pressure excellent 120/80, pulse 76 regular, pulse oximetry 96%, weight 160 pounds.  BMI 30.23  Lipid panel is basically normal.  Triglycerides very minimally elevated at 152 otherwise normal.  Hemoglobin A1c has increased from 5.9% in August to 6.2%.  She will work on diet and exercise.  Has gained 3 pounds since August 2021.  Liver functions are normal.      Assessment & Plan:  Coronary artery disease status post multi stent placement.  No complaint of chest pain.  Blood pressure is under excellent control.  Lipid panel stable.  She is on statin medication.  She is also on chronic Plavix therapy  Hypertension stable on current regimen HCTZ and  amlodipine as well as metoprolol  History of memory deficit- has been seen by neurology for mild cognitive deficits  Longstanding history of insomnia treated with Xanax as needed at bedtime.  She really cannot do without this medication.  GE reflux treated with Nexium.  History of sleep apnea/oxygen desaturation treated with CPAP  History of depression treated with Pristiq.  GE reflux treated with PPI  History of impaired glucose tolerance treated with diet  Plan: Continue follow-up with Dr. Burt Knack.  She will continue with current medications and follow-up here for health maintenance exam and Medicare wellness visit in 6 months.  35 minutes spent with patient today discussing these issues  Plan: All in all she is doing well.  I do not know she would benefit from counseling to deal with situational stress with husband but it could be considered.  Continue current medications and return in 6 months.

## 2021-02-23 ENCOUNTER — Other Ambulatory Visit: Payer: Self-pay | Admitting: Cardiovascular Disease

## 2021-02-25 ENCOUNTER — Other Ambulatory Visit: Payer: Self-pay | Admitting: Internal Medicine

## 2021-02-25 NOTE — Telephone Encounter (Signed)
Please check these Rxs and I can sign.

## 2021-02-27 ENCOUNTER — Other Ambulatory Visit: Payer: Self-pay | Admitting: Internal Medicine

## 2021-03-18 DIAGNOSIS — Z6828 Body mass index (BMI) 28.0-28.9, adult: Secondary | ICD-10-CM | POA: Diagnosis not present

## 2021-03-18 DIAGNOSIS — I10 Essential (primary) hypertension: Secondary | ICD-10-CM | POA: Diagnosis not present

## 2021-03-18 DIAGNOSIS — M5416 Radiculopathy, lumbar region: Secondary | ICD-10-CM | POA: Diagnosis not present

## 2021-03-20 ENCOUNTER — Other Ambulatory Visit: Payer: Self-pay | Admitting: Pain Medicine

## 2021-03-20 DIAGNOSIS — M5416 Radiculopathy, lumbar region: Secondary | ICD-10-CM

## 2021-03-20 DIAGNOSIS — H5211 Myopia, right eye: Secondary | ICD-10-CM | POA: Diagnosis not present

## 2021-03-20 DIAGNOSIS — H25013 Cortical age-related cataract, bilateral: Secondary | ICD-10-CM | POA: Diagnosis not present

## 2021-03-20 DIAGNOSIS — H524 Presbyopia: Secondary | ICD-10-CM | POA: Diagnosis not present

## 2021-03-20 DIAGNOSIS — H2513 Age-related nuclear cataract, bilateral: Secondary | ICD-10-CM | POA: Diagnosis not present

## 2021-03-25 ENCOUNTER — Ambulatory Visit
Admission: RE | Admit: 2021-03-25 | Discharge: 2021-03-25 | Disposition: A | Discharge status: Home / Self Care | Payer: Medicare Other | Source: Ambulatory Visit | Attending: Pain Medicine | Admitting: Pain Medicine

## 2021-03-25 DIAGNOSIS — M48061 Spinal stenosis, lumbar region without neurogenic claudication: Secondary | ICD-10-CM | POA: Diagnosis not present

## 2021-03-25 DIAGNOSIS — M5416 Radiculopathy, lumbar region: Secondary | ICD-10-CM

## 2021-03-25 DIAGNOSIS — M545 Low back pain, unspecified: Secondary | ICD-10-CM | POA: Diagnosis not present

## 2021-04-02 ENCOUNTER — Encounter: Payer: Self-pay | Admitting: Cardiovascular Disease

## 2021-04-02 ENCOUNTER — Telehealth: Payer: Self-pay | Admitting: *Deleted

## 2021-04-02 ENCOUNTER — Other Ambulatory Visit: Payer: Self-pay

## 2021-04-02 ENCOUNTER — Ambulatory Visit (INDEPENDENT_AMBULATORY_CARE_PROVIDER_SITE_OTHER): Payer: Medicare Other | Admitting: Cardiovascular Disease

## 2021-04-02 VITALS — BP 130/80 | HR 85 | Ht 61.0 in | Wt 162.4 lb

## 2021-04-02 DIAGNOSIS — I10 Essential (primary) hypertension: Secondary | ICD-10-CM

## 2021-04-02 DIAGNOSIS — I25119 Atherosclerotic heart disease of native coronary artery with unspecified angina pectoris: Secondary | ICD-10-CM | POA: Diagnosis not present

## 2021-04-02 DIAGNOSIS — E782 Mixed hyperlipidemia: Secondary | ICD-10-CM

## 2021-04-02 DIAGNOSIS — M5416 Radiculopathy, lumbar region: Secondary | ICD-10-CM | POA: Diagnosis not present

## 2021-04-02 DIAGNOSIS — S32040A Wedge compression fracture of fourth lumbar vertebra, initial encounter for closed fracture: Secondary | ICD-10-CM | POA: Diagnosis not present

## 2021-04-02 NOTE — Patient Instructions (Signed)
Medication Instructions:  Your provider recommends that you continue on your current medications as directed. Please refer to the Current Medication list given to you today.   *If you need a refill on your cardiac medications before your next appointment, please call your pharmacy*  Follow-Up: At [facility], you and your health needs are our priority.  As part of our continuing mission to provide you with exceptional heart care, we have created designated Provider Care Teams.  These Care Teams include your primary Cardiologist (physician) and Advanced Practice Providers (APPs -  Physician Assistants and Nurse Practitioners) who all work together to provide you with the care you need, when you need it. Your next appointment:   6-9 month(s) The format for your next appointment:   In Person Provider:   You may see Michael Cooper, MD or one of the following Advanced Practice Providers on your designated Care Team:    Scott Weaver, PA-C  Vin Bhagat, PA-C   

## 2021-04-02 NOTE — Progress Notes (Signed)
Cardiology Office Note:    Date:  04/02/2021   ID:  Carrie Chung, Carrie Chung 06-11-1941, MRN [id number]  PCP:  [name], [place]  Cardiologist:  [name], MD  Advanced Practice Provider:  No care team member to display Electrophysiologist:  None       Referring MD: [name], MD   Chief Complaint  Patient presents with  . Coronary Artery Disease    History of Present Illness:    Carrie Chung is a 80 y.o. female with a hx of coronary artery disease, presenting for follow-up evaluation. The patient initially presented with non-ST elevation infarct in 2013. She was found to have multivessel coronary artery disease and underwent multivessel stenting at that time. She has been managed medically since then with no recurrent ischemic events. When she was seen last in October 2021, she described anginal discomfort with emotional stress.  A Lexiscan stress Myoview was performed and demonstrated no ischemia.  The patient is here alone today. Since her last visit, she slipped and had a fall, sustaining a compression fracture. She is planning on having an epidural injection in the near future.  She will need to hold clopidogrel x 7 days for the procedure.  She remains under a good bit of stress with her husband's health problems.  She otherwise is doing okay from a cardiac perspective.  She has occasional focal area of left-sided chest pain that has not changed over time.  She denies shortness of breath, orthopnea, PND, or heart palpitations.  She is compliant with her medications.  Past Medical History:  Diagnosis Date  . Atrial tachycardia (HCC)    a. noted at cardiac rehab 5/13; event monitor ordered to assess for AFib  . CAD (coronary artery disease)    a. NSTEMI 03/2012 ([place] 03/27/12: pLAD 30%, mLAD 99%, mCFX 95%, mRCA 99% with thrombus, EF 65%);  s/p PTCA/DESx1 to prox-mid RCA 03/27/12 urgently in setting of hypotension/bradycardia, with  staged PTCA/Evolve study stent to mid LAD & PTCA/Evolve study stent to prox LCx 03/29/12 ;   echo 03/28/12:EF 60%, Aortic sclerosis without AS, mild RVE, mild reduced RVSF    . Endometrial polyp   . HTN (hypertension)   . Hyperlipidemia   . MI, old   . Sleep apnea     Past Surgical History:  Procedure Laterality Date  . APPENDECTOMY  80 years old  . CAROTID STENT  04/2012   x3  . CESAREAN SECTION  1984  . CHOLECYSTECTOMY  1978  . LEFT HEART CATHETERIZATION WITH CORONARY ANGIOGRAM N/A 03/27/2012   Procedure: LEFT HEART CATHETERIZATION WITH CORONARY ANGIOGRAM;  Surgeon: Burnell Blanks, MD;  Location: [facility] CATH LAB;  Service: Cardiovascular;  Laterality: N/A;  . PERCUTANEOUS CORONARY STENT INTERVENTION (PCI-S) N/A 03/27/2012   Procedure: PERCUTANEOUS CORONARY STENT INTERVENTION (PCI-S);  Surgeon: Burnell Blanks, MD;  Location: [facility] CATH LAB;  Service: Cardiovascular;  Laterality: N/A;  . PERCUTANEOUS CORONARY STENT INTERVENTION (PCI-S) N/A 03/29/2012   Procedure: PERCUTANEOUS CORONARY STENT INTERVENTION (PCI-S);  Surgeon: [name], MD;  Location: [facility] CATH LAB;  Service: Cardiovascular;  Laterality: N/A;  . TONSILLECTOMY  80 years old    Current Medications: Current Meds  Medication Sig  . ALPRAZolam (XANAX) 1 MG tablet TAKE 1 TABLET(1 MG) BY MOUTH AT BEDTIME AS NEEDED FOR SLEEP  . amLODipine (NORVASC) 5 MG tablet TAKE 1 TABLET BY MOUTH EVERY DAY  . aspirin 81 MG tablet Take 1 tablet (81  mg total) by mouth daily.  . Calcium Carbonate-Vitamin D 600-400 MG-UNIT tablet Take 1 tablet by mouth daily.  . clopidogrel (PLAVIX) 75 MG tablet TAKE 1 TABLET(75 MG) BY MOUTH DAILY  . desvenlafaxine (PRISTIQ) 50 MG 24 hr tablet TAKE 1 TABLET(50 MG) BY MOUTH DAILY  . esomeprazole (NEXIUM) 40 MG capsule Take 40 mg by mouth daily at 12 noon.  . FOLBIC 2.5-25-2 MG TABS tablet TAKE 1 TABLET BY MOUTH DAILY  . hydrochlorothiazide (HYDRODIURIL) 25 MG tablet TAKE 1 TABLET BY MOUTH EVERY DAY  .  metoprolol tartrate (LOPRESSOR) 50 MG tablet TAKE 1 AND 1/2 TABLETS(75 MG) BY MOUTH TWICE DAILY  . nitroGLYCERIN (NITROSTAT) 0.4 MG SL tablet Place 1 tablet (0.4 mg total) under the tongue every 5 (five) minutes as needed for chest pain.  . NON FORMULARY Oxygen 2L at night  . OXYGEN Inhale into the lungs. Per patient using 2 liters at night.  . triamcinolone cream (KENALOG) 0.1 % Apply 1 application topically 3 (three) times daily.     Allergies:   Penicillins, Sulfonamide derivatives, and Sulfamethoxazole   Social History   Socioeconomic History  . Marital status: Married    Spouse name: Not on file  . Number of children: 1  . Years of education: Coll +  . Highest education level: Not on file  Occupational History  . Occupation: Retired Marine scientist  Tobacco Use  . Smoking status: Former Smoker    Types: Cigarettes    Quit date: 04/19/1980    Years since quitting: 40.9  . Smokeless tobacco: Never Used  Substance and Sexual Activity  . Alcohol use: Yes    Alcohol/week: 20.0 standard drinks    Types: 20 Glasses of wine per week    Comment: socially  . Drug use: No  . Sexual activity: Never  Other Topics Concern  . Not on file  Social History Narrative   Caffeine 2 cups tea.   Social Determinants of Health   Financial Resource Strain: Not on file  Food Insecurity: Not on file  Transportation Needs: Not on file  Physical Activity: Not on file  Stress: Not on file  Social Connections: Not on file     Family History: The patient's family history includes Cancer - Other in her father; Colon cancer in her father; Coronary artery disease in her mother.  ROS:   Please see the history of present illness.    All other systems reviewed and are negative.  EKGs/Labs/Other Studies Reviewed:    The following studies were reviewed today: Myoview Stress Test 11/22/2020: Study Highlights    The left ventricular ejection fraction is hyperdynamic (>65%).  Nuclear stress EF:  75%.  There was no ST segment deviation noted during stress.  The study is normal.  This is a low risk study.   Low risk stress nuclear study with normal perfusion and normal left ventricular regional and global systolic function.   EKG:  EKG is not ordered today.    Recent Labs: 08/16/2020: BUN 10; Creat 0.82; Hemoglobin 14.7; Platelets 157; Potassium 3.5; Sodium 144; TSH 3.28 02/18/2021: ALT 13  Recent Lipid Panel    Component Value Date/Time   CHOL 173 02/18/2021 0916   TRIG 152 (H) 02/18/2021 0916   HDL 73 02/18/2021 0916   CHOLHDL 2.4 02/18/2021 0916   VLDL 41 (H) 03/25/2017 1102   LDLCALC 75 02/18/2021 0916   LDLDIRECT 152.4 03/09/2012 0900     Risk Assessment/Calculations:       Physical Exam:  VS:  BP 130/80   Pulse 85   Ht 5\' 1"  (1.549 m)   Wt 162 lb 6.4 oz (73.7 kg)   SpO2 95%   BMI 30.69 kg/m     Wt Readings from Last 3 Encounters:  04/02/21 162 lb 6.4 oz (73.7 kg)  02/20/21 160 lb (72.6 kg)  11/21/20 155 lb (70.3 kg)     GEN:  Well nourished, well developed in no acute distress HEENT: Normal NECK: No JVD; No carotid bruits LYMPHATICS: No lymphadenopathy CARDIAC: RRR, no murmurs, rubs, gallops RESPIRATORY:  Clear to auscultation without rales, wheezing or rhonchi  ABDOMEN: Soft, non-tender, non-distended MUSCULOSKELETAL:  No edema; No deformity  SKIN: Warm and dry NEUROLOGIC:  Alert and oriented x 3 PSYCHIATRIC:  Normal affect   ASSESSMENT:    1. Coronary artery disease involving native coronary artery of native heart with angina pectoris ([place])   2. Mixed hyperlipidemia   3. Essential hypertension    PLAN:    In order of problems listed above:  1. Doing well, symptoms controlled, nuclear stress test result showed no ischemia.  2. Treated with rosuvastatin 20 mg daily. Recent labs with cholesterol 173, HDL 73, LDL 75. 3. BP well-controlled. Continue current Rx (amlodipine, metoprolol, HCTZ)   Medication Adjustments/Labs and Tests  Ordered: Current medicines are reviewed at length with the patient today.  Concerns regarding medicines are outlined above.  No orders of the defined types were placed in this encounter.  No orders of the defined types were placed in this encounter.   There are no Patient Instructions on file for this visit.   Signed, [name], MD  04/02/2021 2:42 PM    [facility]

## 2021-04-02 NOTE — Telephone Encounter (Signed)
   [facility] Pre-operative Risk Assessment    Patient Name: Carrie Chung  DOB: [birth year]  MRN: [id number]   HEARTCARE STAFF: - Please ensure there is not already an duplicate clearance open for this procedure. - Under Visit Info/Reason for Call, type in Other and utilize the format Clearance MM/DD/YY or Clearance TBD. Do not use dashes or single digits. - If request is for dental extraction, please clarify the # of teeth to be extracted.  Request for surgical clearance:  1. What type of surgery is being performed? LUMBAR EPIDURAL INJECTION L3-L4   2. When is this surgery scheduled? TBD   3. What type of clearance is required (medical clearance vs. Pharmacy clearance to hold med vs. Both)? MEDICAL  4. Are there any medications that need to be held prior to surgery and how long? PLAVIX x 7 DAYS PRIOR TO INJECTION   5. Practice name and name of physician performing surgery? [facility]; DR. Ninfa Meeker   6. What is the office phone number? [phone]   7.   What is the office fax number? [phone]  8.   Anesthesia type (None, local, MAC, general) ? NOT LISTED   Julaine Hua 04/02/2021, 2:22 PM  _________________________________________________________________   (provider comments below)

## 2021-04-02 NOTE — Telephone Encounter (Signed)
   Name: Carrie Chung  DOB: 1941/03/01  MRN: [id number]   Primary Cardiologist: [name], MD  Chart reviewed as part of pre-operative protocol coverage.   Carrie Chung was last seen on 04/02/21 by Dr. Burt Knack.  She had a nonischemic nuclear stress test in Dec 2021. She was doing well from a cardiac perspective. She will need to hold plavix for 7 days prior to lumbar injection. Resume after when safe to do so.  Therefore, based on ACC/AHA guidelines, the patient would be at acceptable risk for the planned procedure without further cardiovascular testing.   I will route this recommendation to the requesting party via Epic fax function and remove from pre-op pool. Please call with questions.  Tami Lin Stehanie Ekstrom, PA 04/02/2021, 4:14 PM

## 2021-04-15 ENCOUNTER — Ambulatory Visit (INDEPENDENT_AMBULATORY_CARE_PROVIDER_SITE_OTHER): Payer: Medicare Other | Admitting: Internal Medicine

## 2021-04-15 ENCOUNTER — Other Ambulatory Visit: Payer: Self-pay

## 2021-04-15 ENCOUNTER — Encounter: Payer: Self-pay | Admitting: Internal Medicine

## 2021-04-15 ENCOUNTER — Telehealth: Payer: Self-pay

## 2021-04-15 VITALS — BP 110/70 | HR 78 | Temp 98.6°F | Ht 61.0 in | Wt 162.0 lb

## 2021-04-15 DIAGNOSIS — J040 Acute laryngitis: Secondary | ICD-10-CM

## 2021-04-15 DIAGNOSIS — I25119 Atherosclerotic heart disease of native coronary artery with unspecified angina pectoris: Secondary | ICD-10-CM | POA: Diagnosis not present

## 2021-04-15 DIAGNOSIS — R059 Cough, unspecified: Secondary | ICD-10-CM | POA: Diagnosis not present

## 2021-04-15 DIAGNOSIS — J069 Acute upper respiratory infection, unspecified: Secondary | ICD-10-CM

## 2021-04-15 DIAGNOSIS — J029 Acute pharyngitis, unspecified: Secondary | ICD-10-CM | POA: Diagnosis not present

## 2021-04-15 MED ORDER — HYDROCODONE-HOMATROPINE 5-1.5 MG/5ML PO SYRP: 5.0000 mL | ORAL_SOLUTION | Freq: Three times a day (TID) | ORAL | 0 refills | Status: DC | PRN

## 2021-04-15 MED ORDER — METHYLPREDNISOLONE ACETATE 80 MG/ML IJ SUSP
80.0000 mg | Freq: Once | INTRAMUSCULAR | Status: AC
  Administered 2021-04-15: 80 mg via INTRAMUSCULAR

## 2021-04-15 MED ORDER — AZITHROMYCIN 250 MG PO TABS: ORAL_TABLET | ORAL | 0 refills | Status: AC

## 2021-04-15 NOTE — Telephone Encounter (Signed)
Lobby visit 

## 2021-04-15 NOTE — Telephone Encounter (Signed)
Scheduled

## 2021-04-15 NOTE — Progress Notes (Signed)
   Subjective:    Patient ID: Carrie Chung, female    DOB: 1941/03/02, 80 y.o.   MRN: [id number]  HPI 80 year old Female  seen today with laryngitis and URI symptoms.  Patient says that she came down when respiratory infection symptoms and sore throat a day or 2 ago.  Has been around granddaughter who is ill.  Home COVID test was negative.  Patient has had 3 COVID-19 immunizations.  No documented fever or shaking chills.  Had some coughing and a slight bit of wheezing last night.    Review of Systems see above no headache, nausea, vomiting or dysgeusia     Objective:   Physical Exam Temperature 98.6 degrees pulse 78 blood pressure 110/70 pulse oximetry 96% weight 162 pounds height 5 feet 1 inches BMI 30.61.  Neck is supple.  Chest clear to auscultation.  TMs are clear.  Pharynx is injected.  Respiratory virus panel and COVID PCR test obtained by nasal swabs.       Assessment & Plan:  Acute laryngitis  Acute upper respiratory infection  Acute pharyngitis  Plan: Zithromax Z-PAK 2 tabs day 1 followed by 1 tab days 2 through 5.  May take Tylenol as needed for sore throat pain.  Hycodan 1 teaspoon every 8 hours as needed for cough and sore throat pain.  Rest and drink plenty of fluids.  Quarantine at home until test results are back.

## 2021-04-15 NOTE — Patient Instructions (Addendum)
Depo-Medrol 80 mg IM given in office.  Take Zithromax Z-PAK 2 tablets day 1 followed by 1 tablet days 2 through 5.  Hycodan 1 teaspoon every 8 hours as needed for cough and sore throat pain.  Rest and drink plenty of fluids.  Respiratory virus panel and COVID-19 PCR test are pending.  Quarantine at home until test results are back

## 2021-04-15 NOTE — Telephone Encounter (Signed)
Had a cough for 2 days, now she has laryngitis. Home Covid test today was negative. She doesn't feel like she has a fever. She has not been around that is sick. She would like an appointment.

## 2021-04-16 LAB — RESPIRATORY VIRUS PANEL

## 2021-04-16 LAB — SARS-COV-2 RNA,(COVID-19) QUALITATIVE NAAT: SARS CoV2 RNA: NOT DETECTED

## 2021-04-19 MED ORDER — ALPRAZOLAM 1 MG PO TABS: ORAL_TABLET | ORAL | 2 refills | Status: DC

## 2021-04-19 MED ORDER — DESVENLAFAXINE SUCCINATE ER 50 MG PO TB24
ORAL_TABLET | ORAL | 3 refills | Status: DC
Start: 2021-04-19 — End: 2021-07-11

## 2021-04-19 NOTE — Patient Instructions (Signed)
It was a pleasure to see you today.  Consider counseling for situational stress.  Continue current medications.  You have gained 6 pounds.  Watch diet and try to get more exercise.  Hemoglobin A1c has increased a bit.  Return in 6 months for health maintenance exam.

## 2021-04-29 ENCOUNTER — Other Ambulatory Visit: Payer: Self-pay | Admitting: Cardiovascular Disease

## 2021-04-29 DIAGNOSIS — Z23 Encounter for immunization: Secondary | ICD-10-CM | POA: Diagnosis not present

## 2021-05-08 DIAGNOSIS — M5416 Radiculopathy, lumbar region: Secondary | ICD-10-CM | POA: Diagnosis not present

## 2021-05-21 ENCOUNTER — Telehealth: Payer: Self-pay

## 2021-05-21 NOTE — Telephone Encounter (Signed)
Called patient to schedule 6 month visit with Dr. Burt Knack. While on the phone, she mentioned that she is going through a lot of stress. Sometimes, her HR goes up to 110 and she has palpitations and her "PACs act up." Offered to discuss with Dr. Burt Knack but she adamantly declined. She stated she did not want to wear a monitor and her breathing exercises help her symptoms.  She promised she would call back if symptoms progress. She was grateful for call and agrees with plan.  Scheduled patient for visit with Dr. Burt Knack in November 2022.

## 2021-05-25 ENCOUNTER — Other Ambulatory Visit: Payer: Self-pay | Admitting: Cardiovascular Disease

## 2021-06-04 DIAGNOSIS — M545 Low back pain, unspecified: Secondary | ICD-10-CM | POA: Diagnosis not present

## 2021-06-04 DIAGNOSIS — M5416 Radiculopathy, lumbar region: Secondary | ICD-10-CM | POA: Diagnosis not present

## 2021-06-19 ENCOUNTER — Emergency Department (HOSPITAL_COMMUNITY): Payer: Medicare Other

## 2021-06-19 ENCOUNTER — Other Ambulatory Visit: Payer: Self-pay

## 2021-06-19 ENCOUNTER — Inpatient Hospital Stay (HOSPITAL_COMMUNITY)
Admission: EM | Admit: 2021-06-19 | Discharge: 2021-06-24 | DRG: 481 | Disposition: A | Discharge status: Rehabilitation Facility | Payer: Medicare Other | Attending: Internal Medicine | Admitting: Internal Medicine

## 2021-06-19 ENCOUNTER — Encounter (HOSPITAL_COMMUNITY): Payer: Self-pay | Admitting: Emergency Medicine

## 2021-06-19 DIAGNOSIS — S7292XA Unspecified fracture of left femur, initial encounter for closed fracture: Secondary | ICD-10-CM | POA: Diagnosis not present

## 2021-06-19 DIAGNOSIS — I471 Supraventricular tachycardia: Secondary | ICD-10-CM | POA: Diagnosis present

## 2021-06-19 DIAGNOSIS — Z955 Presence of coronary angioplasty implant and graft: Secondary | ICD-10-CM

## 2021-06-19 DIAGNOSIS — J9611 Chronic respiratory failure with hypoxia: Secondary | ICD-10-CM | POA: Diagnosis not present

## 2021-06-19 DIAGNOSIS — Z20822 Contact with and (suspected) exposure to covid-19: Secondary | ICD-10-CM | POA: Diagnosis present

## 2021-06-19 DIAGNOSIS — Z9049 Acquired absence of other specified parts of digestive tract: Secondary | ICD-10-CM

## 2021-06-19 DIAGNOSIS — F419 Anxiety disorder, unspecified: Secondary | ICD-10-CM | POA: Diagnosis present

## 2021-06-19 DIAGNOSIS — S0990XA Unspecified injury of head, initial encounter: Secondary | ICD-10-CM

## 2021-06-19 DIAGNOSIS — R9431 Abnormal electrocardiogram [ECG] [EKG]: Secondary | ICD-10-CM | POA: Diagnosis present

## 2021-06-19 DIAGNOSIS — T40605A Adverse effect of unspecified narcotics, initial encounter: Secondary | ICD-10-CM | POA: Diagnosis not present

## 2021-06-19 DIAGNOSIS — Z800 Family history of malignant neoplasm of digestive organs: Secondary | ICD-10-CM

## 2021-06-19 DIAGNOSIS — Z882 Allergy status to sulfonamides status: Secondary | ICD-10-CM

## 2021-06-19 DIAGNOSIS — F32A Depression, unspecified: Secondary | ICD-10-CM | POA: Diagnosis present

## 2021-06-19 DIAGNOSIS — Z8249 Family history of ischemic heart disease and other diseases of the circulatory system: Secondary | ICD-10-CM

## 2021-06-19 DIAGNOSIS — Z880 Allergy status to penicillin: Secondary | ICD-10-CM

## 2021-06-19 DIAGNOSIS — M7989 Other specified soft tissue disorders: Secondary | ICD-10-CM | POA: Diagnosis not present

## 2021-06-19 DIAGNOSIS — I252 Old myocardial infarction: Secondary | ICD-10-CM

## 2021-06-19 DIAGNOSIS — M25532 Pain in left wrist: Secondary | ICD-10-CM | POA: Diagnosis not present

## 2021-06-19 DIAGNOSIS — I959 Hypotension, unspecified: Secondary | ICD-10-CM | POA: Diagnosis not present

## 2021-06-19 DIAGNOSIS — M549 Dorsalgia, unspecified: Secondary | ICD-10-CM | POA: Diagnosis present

## 2021-06-19 DIAGNOSIS — Z7982 Long term (current) use of aspirin: Secondary | ICD-10-CM

## 2021-06-19 DIAGNOSIS — Z043 Encounter for examination and observation following other accident: Secondary | ICD-10-CM | POA: Diagnosis not present

## 2021-06-19 DIAGNOSIS — E876 Hypokalemia: Secondary | ICD-10-CM | POA: Diagnosis present

## 2021-06-19 DIAGNOSIS — G8929 Other chronic pain: Secondary | ICD-10-CM | POA: Diagnosis present

## 2021-06-19 DIAGNOSIS — S0081XA Abrasion of other part of head, initial encounter: Secondary | ICD-10-CM | POA: Diagnosis present

## 2021-06-19 DIAGNOSIS — R0902 Hypoxemia: Secondary | ICD-10-CM | POA: Diagnosis not present

## 2021-06-19 DIAGNOSIS — Z7902 Long term (current) use of antithrombotics/antiplatelets: Secondary | ICD-10-CM

## 2021-06-19 DIAGNOSIS — Z9981 Dependence on supplemental oxygen: Secondary | ICD-10-CM | POA: Diagnosis not present

## 2021-06-19 DIAGNOSIS — M25552 Pain in left hip: Secondary | ICD-10-CM | POA: Diagnosis not present

## 2021-06-19 DIAGNOSIS — S72002A Fracture of unspecified part of neck of left femur, initial encounter for closed fracture: Secondary | ICD-10-CM

## 2021-06-19 DIAGNOSIS — G4730 Sleep apnea, unspecified: Secondary | ICD-10-CM | POA: Diagnosis present

## 2021-06-19 DIAGNOSIS — I10 Essential (primary) hypertension: Secondary | ICD-10-CM | POA: Diagnosis not present

## 2021-06-19 DIAGNOSIS — E785 Hyperlipidemia, unspecified: Secondary | ICD-10-CM | POA: Diagnosis present

## 2021-06-19 DIAGNOSIS — W19XXXA Unspecified fall, initial encounter: Secondary | ICD-10-CM

## 2021-06-19 DIAGNOSIS — S60212A Contusion of left wrist, initial encounter: Secondary | ICD-10-CM | POA: Diagnosis not present

## 2021-06-19 DIAGNOSIS — Z87891 Personal history of nicotine dependence: Secondary | ICD-10-CM

## 2021-06-19 DIAGNOSIS — S0001XA Abrasion of scalp, initial encounter: Secondary | ICD-10-CM | POA: Diagnosis present

## 2021-06-19 DIAGNOSIS — I2510 Atherosclerotic heart disease of native coronary artery without angina pectoris: Secondary | ICD-10-CM | POA: Diagnosis present

## 2021-06-19 DIAGNOSIS — W010XXA Fall on same level from slipping, tripping and stumbling without subsequent striking against object, initial encounter: Secondary | ICD-10-CM | POA: Diagnosis present

## 2021-06-19 DIAGNOSIS — S72142A Displaced intertrochanteric fracture of left femur, initial encounter for closed fracture: Principal | ICD-10-CM | POA: Diagnosis present

## 2021-06-19 DIAGNOSIS — Y92017 Garden or yard in single-family (private) house as the place of occurrence of the external cause: Secondary | ICD-10-CM

## 2021-06-19 DIAGNOSIS — F418 Other specified anxiety disorders: Secondary | ICD-10-CM | POA: Diagnosis present

## 2021-06-19 DIAGNOSIS — Z79899 Other long term (current) drug therapy: Secondary | ICD-10-CM

## 2021-06-19 DIAGNOSIS — Z419 Encounter for procedure for purposes other than remedying health state, unspecified: Secondary | ICD-10-CM

## 2021-06-19 LAB — CBC WITH DIFFERENTIAL/PLATELET
Abs Immature Granulocytes: 0.03 10*3/uL (ref 0.00–0.07)
Basophils Absolute: 0 10*3/uL (ref 0.0–0.1)
Basophils Relative: 0 %
Eosinophils Absolute: 0 10*3/uL (ref 0.0–0.5)
Eosinophils Relative: 0 %
HCT: 38.6 % (ref 36.0–46.0)
Hemoglobin: 13.2 g/dL (ref 12.0–15.0)
Immature Granulocytes: 0 %
Lymphocytes Relative: 19 %
Lymphs Abs: 1.3 10*3/uL (ref 0.7–4.0)
MCH: 32.8 pg (ref 26.0–34.0)
MCHC: 34.2 g/dL (ref 30.0–36.0)
MCV: 96 fL (ref 80.0–100.0)
Monocytes Absolute: 0.4 10*3/uL (ref 0.1–1.0)
Monocytes Relative: 6 %
Neutro Abs: 5 10*3/uL (ref 1.7–7.7)
Neutrophils Relative %: 75 %
Platelets: 146 10*3/uL — ABNORMAL LOW (ref 150–400)
RBC: 4.02 MIL/uL (ref 3.87–5.11)
RDW: 12.9 % (ref 11.5–15.5)
WBC: 6.7 10*3/uL (ref 4.0–10.5)
nRBC: 0 % (ref 0.0–0.2)

## 2021-06-19 LAB — BASIC METABOLIC PANEL
Anion gap: 13 (ref 5–15)
BUN: 5 mg/dL — ABNORMAL LOW (ref 8–23)
CO2: 27 mmol/L (ref 22–32)
Calcium: 8.6 mg/dL — ABNORMAL LOW (ref 8.9–10.3)
Chloride: 96 mmol/L — ABNORMAL LOW (ref 98–111)
Creatinine, Ser: 0.62 mg/dL (ref 0.44–1.00)
GFR, Estimated: >60 mL/min (ref >60)
Glucose, Bld: 122 mg/dL — ABNORMAL HIGH (ref 70–99)
Potassium: 2.8 mmol/L — ABNORMAL LOW (ref 3.5–5.1)
Sodium: 136 mmol/L (ref 135–145)

## 2021-06-19 LAB — PROTIME-INR
INR: 1 (ref 0.8–1.2)
Prothrombin Time: 13.5 seconds (ref 11.4–15.2)

## 2021-06-19 LAB — TYPE AND SCREEN
ABO/RH(D): O POS
Antibody Screen: NEGATIVE

## 2021-06-19 MED ORDER — HYDROMORPHONE HCL 1 MG/ML IJ SOLN
0.5000 mg | Freq: Once | INTRAMUSCULAR | Status: AC
  Administered 2021-06-19: 0.5 mg via INTRAVENOUS
  Filled 2021-06-19: qty 1

## 2021-06-19 MED ORDER — SODIUM CHLORIDE 0.9 % IV SOLN: INTRAVENOUS | Status: DC

## 2021-06-19 NOTE — ED Notes (Signed)
Patient transported to CT 

## 2021-06-19 NOTE — ED Triage Notes (Signed)
Pt here via [facility] from home for mechanical fall. Pt tripped on uneven bricks, fell to L, c/o L hip pain. Pt has hematoma to L wrist, abrasion to L elbow, and abrasion to L forehead. EMS gave total 200 mcg fentanyl, pain now 2/10. Pt wears O2 at night, placed on 2L by EMS.   130/68 80 HR 93% RA

## 2021-06-19 NOTE — ED Notes (Signed)
Patient transported to X-ray 

## 2021-06-19 NOTE — ED Provider Notes (Signed)
[facility] EMERGENCY DEPARTMENT Provider Note   CSN: [id number] Arrival date & time: 06/19/21  2121     History Chief Complaint  Patient presents with   Fall    Carrie Chung is a 80 y.o. female.   Fall Pertinent negatives include no abdominal pain and no shortness of breath. Patient presents after mechanical fall.  Was in the backyard going to see a friend tripped on bricks and fell.  Hematoma to left wrist.  Abrasion to forehead.  Severe pain in left hip.  Denies loss of consciousness.  Oxygen at night.  Is on Plavix.  Would not be able ambulate at this time.  Got fentanyl by EMS.  No neck pain.  History of chronic back pain but states it does not feel any different now.     Past Medical History:  Diagnosis Date   Atrial tachycardia ([place])    a. noted at cardiac rehab 5/13; event monitor ordered to assess for AFib   CAD (coronary artery disease)    a. 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   Cognitive complaints 06/02/2016   Hypoxemic respiratory failure, chronic (HCC) 09/19/2015   MCI (mild cognitive impairment) 03/06/2015   Depression with anxiety 03/06/2015   B[place]ae 07/02/2014   History of MRSA infection 11/20/2012   Low back pain 07/07/2012   Insomnia 04/28/2012   Coronary Artery Disease 04/19/2012   Hyperlipidemia 04/19/2012   Anxiety 04/19/2012   Non-ST elevation myocardial infarction (NSTEMI), initial care episode ([place]) 03/28/2012   HYPERTENSION, BENIGN  06/12/2010   Dyspnea 06/12/2010   Nonspecific abnormal electrocardiogram (ECG) (EKG) 06/12/2010    Past Surgical History:  Procedure Laterality Date   APPENDECTOMY  80 years old   CAROTID STENT  04/2012   x3   [place]   LEFT HEART CATHETERIZATION WITH CORONARY ANGIOGRAM N/A 03/27/2012   Procedure: LEFT HEART CATHETERIZATION WITH CORONARY ANGIOGRAM;  Surgeon: Burnell Blanks, MD;  Location: [facility] CATH LAB;  Service: Cardiovascular;  Laterality: N/A;   PERCUTANEOUS CORONARY STENT INTERVENTION (PCI-S) N/A 03/27/2012   Procedure: PERCUTANEOUS CORONARY STENT INTERVENTION (PCI-S);  Surgeon: Burnell Blanks, MD;  Location: [facility] CATH LAB;  Service: Cardiovascular;  Laterality: N/A;   PERCUTANEOUS CORONARY STENT INTERVENTION (PCI-S) N/A 03/29/2012   Procedure: PERCUTANEOUS CORONARY STENT INTERVENTION (PCI-S);  Surgeon: [name], MD;  Location: [facility] CATH LAB;  Service: Cardiovascular;  Laterality: N/A;   TONSILLECTOMY  80 years old     OB History   No obstetric history on file.     Family History  Problem Relation Age of Onset   Coronary artery disease Mother        CABG at age 69, PPM   Colon cancer Father    Cancer - Other Father        larnyx    Social History   Tobacco Use   Smoking status: Former  Pack years: 0.00    Types: Cigarettes    Quit date: 04/19/1980    Years since quitting: 41.1   Smokeless tobacco: Never  Substance Use Topics   Alcohol use: Yes    Alcohol/week: 20.0 standard drinks    Types: 20 Glasses of wine per week    Comment: socially   Drug use: No    Home Medications Prior to Admission medications   Medication Sig Start Date End Date Taking? Authorizing Provider  ALPRAZolam (XANAX) 1 MG tablet TAKE 1 TABLET(1 MG) BY MOUTH AT BEDTIME AS NEEDED FOR SLEEP 04/19/21   [name], MD  amLODipine (NORVASC) 5 MG tablet TAKE 1 TABLET BY MOUTH EVERY DAY 08/27/20   [name], MD  aspirin 81 MG tablet Take 1  tablet (81 mg total) by mouth daily. 03/30/12   Dunn, Nedra Hai, PA-C  Calcium Carbonate-Vitamin D 600-400 MG-UNIT tablet Take 1 tablet by mouth daily.    [provider]  clopidogrel (PLAVIX) 75 MG tablet TAKE 1 TABLET(75 MG) BY MOUTH DAILY 02/24/21   [name], MD  desvenlafaxine (PRISTIQ) 50 MG 24 hr tablet TAKE 1 TABLET(50 MG) BY MOUTH DAILY 04/19/21   [name], MD  desvenlafaxine (PRISTIQ) 50 MG 24 hr tablet TAKE 1 TABLET(50 MG) BY MOUTH DAILY 02/27/21   [name], MD  esomeprazole (NEXIUM) 40 MG capsule Take 40 mg by mouth daily at 12 noon.    [provider]  FOLBIC 2.5-25-2 MG TABS tablet TAKE 1 TABLET BY MOUTH DAILY 02/25/21   [name], MD  hydrochlorothiazide (HYDRODIURIL) 25 MG tablet TAKE 1 TABLET BY MOUTH EVERY DAY 04/29/21   [name], MD  HYDROcodone-homatropine [facility]) 5-1.5 MG/5ML syrup Take 5 mLs by mouth every 8 (eight) hours as needed for cough. 04/15/21   [name], MD  metoprolol tartrate (LOPRESSOR) 50 MG tablet TAKE 1 AND 1/2 TABLETS(75 MG) BY MOUTH TWICE DAILY 12/24/20   [name], MD  nitroGLYCERIN (NITROSTAT) 0.4 MG SL tablet Place 1 tablet (0.4 mg total) under the tongue every 5 (five) minutes as needed for chest pain. 12/01/19   [name], MD  NON FORMULARY Oxygen 2L at night    [provider]  OXYGEN Inhale into the lungs. Per patient using 2 liters at night.    [provider]  rosuvastatin (CRESTOR) 20 MG tablet TAKE 1 TABLET(20 MG) BY MOUTH DAILY 05/26/21   [name], MD  triamcinolone cream (KENALOG) 0.1 % Apply 1 application topically 3 (three) times daily. 08/15/19   [name], MD    Allergies    Penicillins, Sulfonamide derivatives, and Sulfamethoxazole  Review of Systems   Review of Systems  Constitutional:  Negative for appetite change and fever.  HENT:  Negative for congestion.   Respiratory:  Negative for shortness of breath.   Gastrointestinal:  Negative for abdominal  pain.  Musculoskeletal:  Positive for back pain.       Left wrist left hip pain.  Skin:  Positive for wound. Negative for rash.  Neurological:  Negative for weakness.  Hematological:  Bruises/bleeds easily.  Psychiatric/Behavioral:  Negative for confusion.    Physical Exam Updated Vital Signs BP 128/64   Pulse 93   Temp 98.8 F (37.1 C) (Oral)   Resp 11   Ht 5\' 1"  (1.549 m)   Wt 68 kg   SpO2 97%   BMI 28.34 kg/m   Physical Exam Vitals and nursing note reviewed.  HENT:  Head:     Comments: Abrasion to left forehead near the eyebrow.  Does not appear to need laceration. Eyes:     Extraocular Movements: Extraocular movements intact.     Pupils: Pupils are equal, round, and reactive to light.  Cardiovascular:     Rate and Rhythm: Regular rhythm.  Pulmonary:     Breath sounds: No wheezing or rhonchi.  Musculoskeletal:        General: Tenderness present.     Cervical back: Neck supple. No tenderness.     Comments: Tenderness to left wrist distally.  Some mild hematoma.  Appears stable.  Mild abrasion does not appear to need laceration.  Slight abrasion to left elbow without underlying bony tenderness.  Tenderness to left hip laterally with decreased range of motion.  Skin:    General: Skin is warm.  Neurological:     Mental Status: She is alert and oriented to person, place, and time.    ED Results / Procedures / Treatments   Labs (all labs ordered are listed, but only abnormal results are displayed) Labs Reviewed  BASIC METABOLIC PANEL - Abnormal; Notable for the following components:      Result Value   Potassium 2.8 (*)    Chloride 96 (*)    Glucose, Bld 122 (*)    BUN 5 (*)    Calcium 8.6 (*)    All other components within normal limits  CBC WITH DIFFERENTIAL/PLATELET - Abnormal; Notable for the following components:   Platelets 146 (*)    All other components within normal limits  PROTIME-INR  URINALYSIS, ROUTINE W REFLEX MICROSCOPIC  TYPE AND SCREEN     EKG EKG Interpretation  Date/Time:  Friday June 20 2021 00:09:25 EDT Ventricular Rate:  93 PR Interval:  158 QRS Duration: 79 QT Interval:  434 QTC Calculation: 540 R Axis:   19 Text Interpretation: Sinus rhythm Borderline repolarization abnormality Prolonged QT interval Confirmed by Davonna Belling [phone]) on 06/20/2021 12:14:46 AM  Radiology DG Chest 1 View  Result Date: 06/19/2021 CLINICAL DATA:  Fall EXAM: CHEST  1 VIEW COMPARISON:  11/21/2020 FINDINGS: The heart size and mediastinal contours are within normal limits. Both lungs are clear. The visualized skeletal structures are unremarkable. IMPRESSION: No active disease. Electronically Signed   By: Fidela Salisbury MD   On: 06/19/2021 22:48   DG Wrist Complete Left  Result Date: 06/19/2021 CLINICAL DATA:  Fall, left wrist pain EXAM: LEFT WRIST - COMPLETE 3+ VIEW COMPARISON:  None. FINDINGS: Three view radiograph left wrist demonstrates normal alignment. No fracture or dislocation. Joint spaces are preserved. There is moderate soft tissue swelling dorsal to the a distal radius and ulna. IMPRESSION: Soft tissue swelling.  No fracture or dislocation. Electronically Signed   By: Fidela Salisbury MD   On: 06/19/2021 22:52   CT HEAD WO CONTRAST  Result Date: 06/19/2021 CLINICAL DATA:  Fall, head injury EXAM: CT HEAD WITHOUT CONTRAST TECHNIQUE: Contiguous axial images were obtained from the base of the skull through the vertex without intravenous contrast. COMPARISON:  MRI 01/14/2018 FINDINGS: Brain: Normal anatomic configuration. Parenchymal volume loss is commensurate with the patient's age. Mild periventricular white matter changes are present likely reflecting the sequela of small vessel ischemia. No abnormal intra or extra-axial mass lesion or fluid collection. No abnormal mass effect or midline shift. No evidence of acute intracranial hemorrhage or infarct. Ventricular size is normal. Cerebellum unremarkable. Vascular: No asymmetric  hyperdense vasculature at the skull base. Skull: Intact Sinuses/Orbits: Paranasal sinuses are  clear. Orbits are unremarkable. Other: Mastoid air cells and middle ear cavities are clear. IMPRESSION: No acute intracranial hemorrhage or infarct.  Mild senescent change. Electronically Signed   By: Fidela Salisbury MD   On: 06/19/2021 22:56   CT Hip Left Wo Contrast  Result Date: 06/19/2021 CLINICAL DATA:  Fall EXAM: CT OF THE LEFT HIP WITHOUT CONTRAST TECHNIQUE: Multidetector CT imaging of the left hip was performed according to the standard protocol. Multiplanar CT image reconstructions were also generated. COMPARISON:  Plain films earlier today FINDINGS: Bones/Joint/Cartilage Left femoral intertrochanteric fracture noted. The fracture is mildly comminuted and impacted. No subluxation or dislocation. Fracture is acute. Ligaments Suboptimally assessed by CT. Muscles and Tendons Unremarkable Soft tissues Unremarkable. IMPRESSION: Comminuted, impacted acute left femoral intertrochanteric fracture. Electronically Signed   By: Rolm Baptise M.D.   On: 06/19/2021 23:55   DG Hip Unilat With Pelvis 2-3 Views Left  Result Date: 06/19/2021 CLINICAL DATA:  Fall, left hip pain EXAM: DG HIP (WITH OR WITHOUT PELVIS) 2-3V LEFT COMPARISON:  10/04/2017 FINDINGS: There is an age-indeterminate impacted basicervical left hip fracture. There is suggestion of trabecula bridging the fracture plane on lateral examination suggesting that this may be healed, however, this is not optimally assessed on this examination. There is no dislocation. No other fracture identified. Mild left hip degenerative arthritis is noted. IMPRESSION: Age indeterminate basicervical fracture of the left hip. Dedicated CT imaging is recommended for further evaluation. Electronically Signed   By: Fidela Salisbury MD   On: 06/19/2021 22:51    Procedures Procedures   Medications Ordered in ED Medications  0.9 %  sodium chloride infusion ( Intravenous New  Bag/Given 06/20/21 0016)  fentaNYL (SUBLIMAZE) injection 100 mcg (has no administration in time range)  HYDROmorphone (DILAUDID) injection 0.5 mg (0.5 mg Intravenous Given 06/19/21 2327)    ED Course  I have reviewed the triage vital signs and the nursing notes.  Pertinent labs & imaging results that were available during my care of the patient were reviewed by me and considered in my medical decision making (see chart for details).    MDM Rules/Calculators/A&P                         Patient with fall and left hip pain.  Also left wrist pain but x-ray reassuring.  Chest x-ray reassuring.  Initial left hip x-ray showed possible fracture but recommended CT scan due to some possible chronic changes.  Have ordered.  CT scan of head reassuring.  On chronic oxygen and Plavix.  Will require admission to hospital if hip is broken.  CT scan done and showed complex intertrochanteric fracture.  Discussed with Dr. Doreatha Martin, who will see patient and operate but not tonight.  Will admit to internal medicine.  Final Clinical Impression(s) / ED Diagnoses Final diagnoses:  Fall, initial encounter  Closed fracture of left hip, initial encounter ([place])  Contusion of left wrist, initial encounter  Minor head injury, initial encounter    Rx / DC Orders ED Discharge Orders     None        Davonna Belling, MD 06/20/21 [phone]

## 2021-06-20 ENCOUNTER — Inpatient Hospital Stay (HOSPITAL_COMMUNITY): Payer: Medicare Other | Admitting: Anesthesiology

## 2021-06-20 ENCOUNTER — Encounter (HOSPITAL_COMMUNITY)
Admission: EM | Disposition: A | Discharge status: Rehabilitation Facility | Payer: Self-pay | Source: Home / Self Care | Attending: Internal Medicine

## 2021-06-20 ENCOUNTER — Encounter (HOSPITAL_COMMUNITY): Payer: Self-pay | Admitting: Family Medicine

## 2021-06-20 ENCOUNTER — Inpatient Hospital Stay (HOSPITAL_COMMUNITY): Payer: Medicare Other

## 2021-06-20 DIAGNOSIS — E876 Hypokalemia: Secondary | ICD-10-CM | POA: Diagnosis present

## 2021-06-20 DIAGNOSIS — I471 Supraventricular tachycardia: Secondary | ICD-10-CM | POA: Diagnosis present

## 2021-06-20 DIAGNOSIS — M549 Dorsalgia, unspecified: Secondary | ICD-10-CM | POA: Diagnosis present

## 2021-06-20 DIAGNOSIS — Z9049 Acquired absence of other specified parts of digestive tract: Secondary | ICD-10-CM | POA: Diagnosis not present

## 2021-06-20 DIAGNOSIS — R9431 Abnormal electrocardiogram [ECG] [EKG]: Secondary | ICD-10-CM

## 2021-06-20 DIAGNOSIS — S72142A Displaced intertrochanteric fracture of left femur, initial encounter for closed fracture: Secondary | ICD-10-CM | POA: Diagnosis not present

## 2021-06-20 DIAGNOSIS — I10 Essential (primary) hypertension: Secondary | ICD-10-CM | POA: Diagnosis not present

## 2021-06-20 DIAGNOSIS — Z9981 Dependence on supplemental oxygen: Secondary | ICD-10-CM | POA: Diagnosis not present

## 2021-06-20 DIAGNOSIS — S72142D Displaced intertrochanteric fracture of left femur, subsequent encounter for closed fracture with routine healing: Secondary | ICD-10-CM | POA: Diagnosis not present

## 2021-06-20 DIAGNOSIS — R42 Dizziness and giddiness: Secondary | ICD-10-CM | POA: Diagnosis present

## 2021-06-20 DIAGNOSIS — G4730 Sleep apnea, unspecified: Secondary | ICD-10-CM | POA: Diagnosis present

## 2021-06-20 DIAGNOSIS — Z8249 Family history of ischemic heart disease and other diseases of the circulatory system: Secondary | ICD-10-CM | POA: Diagnosis not present

## 2021-06-20 DIAGNOSIS — W1830XD Fall on same level, unspecified, subsequent encounter: Secondary | ICD-10-CM | POA: Diagnosis not present

## 2021-06-20 DIAGNOSIS — S0001XA Abrasion of scalp, initial encounter: Secondary | ICD-10-CM | POA: Diagnosis present

## 2021-06-20 DIAGNOSIS — K5900 Constipation, unspecified: Secondary | ICD-10-CM | POA: Diagnosis present

## 2021-06-20 DIAGNOSIS — S0081XA Abrasion of other part of head, initial encounter: Secondary | ICD-10-CM | POA: Diagnosis present

## 2021-06-20 DIAGNOSIS — R600 Localized edema: Secondary | ICD-10-CM | POA: Diagnosis not present

## 2021-06-20 DIAGNOSIS — R110 Nausea: Secondary | ICD-10-CM | POA: Diagnosis not present

## 2021-06-20 DIAGNOSIS — F419 Anxiety disorder, unspecified: Secondary | ICD-10-CM | POA: Diagnosis present

## 2021-06-20 DIAGNOSIS — I959 Hypotension, unspecified: Secondary | ICD-10-CM | POA: Diagnosis not present

## 2021-06-20 DIAGNOSIS — K219 Gastro-esophageal reflux disease without esophagitis: Secondary | ICD-10-CM | POA: Diagnosis present

## 2021-06-20 DIAGNOSIS — I2510 Atherosclerotic heart disease of native coronary artery without angina pectoris: Secondary | ICD-10-CM

## 2021-06-20 DIAGNOSIS — I509 Heart failure, unspecified: Secondary | ICD-10-CM | POA: Diagnosis not present

## 2021-06-20 DIAGNOSIS — Z20822 Contact with and (suspected) exposure to covid-19: Secondary | ICD-10-CM | POA: Diagnosis present

## 2021-06-20 DIAGNOSIS — G4700 Insomnia, unspecified: Secondary | ICD-10-CM | POA: Diagnosis present

## 2021-06-20 DIAGNOSIS — D62 Acute posthemorrhagic anemia: Secondary | ICD-10-CM | POA: Diagnosis not present

## 2021-06-20 DIAGNOSIS — M25552 Pain in left hip: Secondary | ICD-10-CM | POA: Diagnosis not present

## 2021-06-20 DIAGNOSIS — Z87891 Personal history of nicotine dependence: Secondary | ICD-10-CM | POA: Diagnosis not present

## 2021-06-20 DIAGNOSIS — W010XXA Fall on same level from slipping, tripping and stumbling without subsequent striking against object, initial encounter: Secondary | ICD-10-CM | POA: Diagnosis present

## 2021-06-20 DIAGNOSIS — Z882 Allergy status to sulfonamides status: Secondary | ICD-10-CM | POA: Diagnosis not present

## 2021-06-20 DIAGNOSIS — J9611 Chronic respiratory failure with hypoxia: Secondary | ICD-10-CM | POA: Diagnosis present

## 2021-06-20 DIAGNOSIS — F418 Other specified anxiety disorders: Secondary | ICD-10-CM | POA: Diagnosis not present

## 2021-06-20 DIAGNOSIS — F32A Depression, unspecified: Secondary | ICD-10-CM | POA: Diagnosis present

## 2021-06-20 DIAGNOSIS — Z880 Allergy status to penicillin: Secondary | ICD-10-CM | POA: Diagnosis not present

## 2021-06-20 DIAGNOSIS — M7989 Other specified soft tissue disorders: Secondary | ICD-10-CM | POA: Diagnosis not present

## 2021-06-20 DIAGNOSIS — Y92017 Garden or yard in single-family (private) house as the place of occurrence of the external cause: Secondary | ICD-10-CM | POA: Diagnosis not present

## 2021-06-20 DIAGNOSIS — S60212A Contusion of left wrist, initial encounter: Secondary | ICD-10-CM | POA: Diagnosis present

## 2021-06-20 DIAGNOSIS — E785 Hyperlipidemia, unspecified: Secondary | ICD-10-CM | POA: Diagnosis present

## 2021-06-20 DIAGNOSIS — S7222XA Displaced subtrochanteric fracture of left femur, initial encounter for closed fracture: Secondary | ICD-10-CM | POA: Diagnosis not present

## 2021-06-20 DIAGNOSIS — Z800 Family history of malignant neoplasm of digestive organs: Secondary | ICD-10-CM | POA: Diagnosis not present

## 2021-06-20 DIAGNOSIS — S7292XD Unspecified fracture of left femur, subsequent encounter for closed fracture with routine healing: Secondary | ICD-10-CM | POA: Diagnosis not present

## 2021-06-20 DIAGNOSIS — S72002A Fracture of unspecified part of neck of left femur, initial encounter for closed fracture: Secondary | ICD-10-CM | POA: Diagnosis not present

## 2021-06-20 DIAGNOSIS — T40605A Adverse effect of unspecified narcotics, initial encounter: Secondary | ICD-10-CM | POA: Diagnosis not present

## 2021-06-20 DIAGNOSIS — G8918 Other acute postprocedural pain: Secondary | ICD-10-CM | POA: Diagnosis present

## 2021-06-20 DIAGNOSIS — G8929 Other chronic pain: Secondary | ICD-10-CM | POA: Diagnosis present

## 2021-06-20 DIAGNOSIS — I252 Old myocardial infarction: Secondary | ICD-10-CM | POA: Diagnosis not present

## 2021-06-20 DIAGNOSIS — Z955 Presence of coronary angioplasty implant and graft: Secondary | ICD-10-CM | POA: Diagnosis not present

## 2021-06-20 DIAGNOSIS — I110 Hypertensive heart disease with heart failure: Secondary | ICD-10-CM | POA: Diagnosis not present

## 2021-06-20 DIAGNOSIS — R5381 Other malaise: Secondary | ICD-10-CM | POA: Diagnosis present

## 2021-06-20 HISTORY — PX: INTRAMEDULLARY (IM) NAIL INTERTROCHANTERIC: SHX5875

## 2021-06-20 LAB — BASIC METABOLIC PANEL
Anion gap: 9 (ref 5–15)
BUN: 6 mg/dL — ABNORMAL LOW (ref 8–23)
CO2: 31 mmol/L (ref 22–32)
Calcium: 8.6 mg/dL — ABNORMAL LOW (ref 8.9–10.3)
Chloride: 95 mmol/L — ABNORMAL LOW (ref 98–111)
Creatinine, Ser: 0.66 mg/dL (ref 0.44–1.00)
GFR, Estimated: >60 mL/min (ref >60)
Glucose, Bld: 150 mg/dL — ABNORMAL HIGH (ref 70–99)
Potassium: 3.4 mmol/L — ABNORMAL LOW (ref 3.5–5.1)
Sodium: 135 mmol/L (ref 135–145)

## 2021-06-20 LAB — CBC
HCT: 36.7 % (ref 36.0–46.0)
Hemoglobin: 12.7 g/dL (ref 12.0–15.0)
MCH: 33.3 pg (ref 26.0–34.0)
MCHC: 34.6 g/dL (ref 30.0–36.0)
MCV: 96.3 fL (ref 80.0–100.0)
Platelets: 128 10*3/uL — ABNORMAL LOW (ref 150–400)
RBC: 3.81 MIL/uL — ABNORMAL LOW (ref 3.87–5.11)
RDW: 13.1 % (ref 11.5–15.5)
WBC: 7.5 10*3/uL (ref 4.0–10.5)
nRBC: 0 % (ref 0.0–0.2)

## 2021-06-20 LAB — RESP PANEL BY RT-PCR (FLU A&B, COVID) ARPGX2
Influenza A by PCR: NEGATIVE
Influenza B by PCR: NEGATIVE
SARS Coronavirus 2 by RT PCR: NEGATIVE

## 2021-06-20 LAB — MAGNESIUM: Magnesium: 2.3 mg/dL (ref 1.7–2.4)

## 2021-06-20 LAB — SURGICAL PCR SCREEN
MRSA, PCR: NEGATIVE
Staphylococcus aureus: NEGATIVE

## 2021-06-20 LAB — ABO/RH: ABO/RH(D): O POS

## 2021-06-20 SURGERY — FIXATION, FRACTURE, INTERTROCHANTERIC, WITH INTRAMEDULLARY ROD
Anesthesia: General | Laterality: Left

## 2021-06-20 MED ORDER — MENTHOL 3 MG MT LOZG: 1.0000 | LOZENGE | OROMUCOSAL | Status: DC | PRN

## 2021-06-20 MED ORDER — VENLAFAXINE HCL ER 75 MG PO CP24
75.0000 mg | ORAL_CAPSULE | Freq: Every day | ORAL | Status: DC
  Administered 2021-06-21 – 2021-06-24 (×4): 75 mg via ORAL
  Filled 2021-06-20 (×5): qty 1

## 2021-06-20 MED ORDER — MORPHINE SULFATE (PF) 2 MG/ML IV SOLN
0.5000 mg | INTRAVENOUS | Status: DC | PRN
  Administered 2021-06-22 – 2021-06-23 (×2): 1 mg via INTRAVENOUS
  Filled 2021-06-20 (×2): qty 1

## 2021-06-20 MED ORDER — FENTANYL CITRATE (PF) 250 MCG/5ML IJ SOLN
INTRAMUSCULAR | Status: DC | PRN
  Administered 2021-06-20: 25 ug via INTRAVENOUS
  Administered 2021-06-20 (×2): 50 ug via INTRAVENOUS
  Administered 2021-06-20 (×3): 25 ug via INTRAVENOUS
  Administered 2021-06-20: 50 ug via INTRAVENOUS

## 2021-06-20 MED ORDER — VANCOMYCIN HCL IN DEXTROSE 1-5 GM/200ML-% IV SOLN
INTRAVENOUS | Status: AC
  Administered 2021-06-20: 1000 mg
  Filled 2021-06-20: qty 200

## 2021-06-20 MED ORDER — ROSUVASTATIN CALCIUM 20 MG PO TABS
20.0000 mg | ORAL_TABLET | Freq: Every day | ORAL | Status: DC
  Administered 2021-06-21 – 2021-06-23 (×4): 20 mg via ORAL
  Filled 2021-06-20 (×4): qty 1

## 2021-06-20 MED ORDER — OXYCODONE HCL 5 MG/5ML PO SOLN: 5.0000 mg | Freq: Once | ORAL | Status: AC | PRN

## 2021-06-20 MED ORDER — PROPOFOL 10 MG/ML IV BOLUS
INTRAVENOUS | Status: DC | PRN
  Administered 2021-06-20: 150 mg via INTRAVENOUS

## 2021-06-20 MED ORDER — METHOCARBAMOL 1000 MG/10ML IJ SOLN
500.0000 mg | Freq: Four times a day (QID) | INTRAVENOUS | Status: DC | PRN
  Administered 2021-06-20: 500 mg via INTRAVENOUS
  Filled 2021-06-20 (×2): qty 5

## 2021-06-20 MED ORDER — ONDANSETRON HCL 4 MG/2ML IJ SOLN: 4.0000 mg | Freq: Four times a day (QID) | INTRAMUSCULAR | Status: DC | PRN

## 2021-06-20 MED ORDER — POTASSIUM CHLORIDE CRYS ER 20 MEQ PO TBCR
20.0000 meq | EXTENDED_RELEASE_TABLET | Freq: Two times a day (BID) | ORAL | Status: AC
  Administered 2021-06-20: 20 meq via ORAL
  Filled 2021-06-20 (×2): qty 1

## 2021-06-20 MED ORDER — PHENYLEPHRINE HCL (PRESSORS) 10 MG/ML IV SOLN
INTRAVENOUS | Status: DC | PRN
  Administered 2021-06-20: 80 ug via INTRAVENOUS

## 2021-06-20 MED ORDER — AMLODIPINE BESYLATE 5 MG PO TABS
5.0000 mg | ORAL_TABLET | Freq: Every day | ORAL | Status: DC
  Administered 2021-06-20 – 2021-06-21 (×2): 5 mg via ORAL
  Filled 2021-06-20 (×3): qty 1

## 2021-06-20 MED ORDER — CHLORHEXIDINE GLUCONATE 4 % EX LIQD
60.0000 mL | Freq: Once | CUTANEOUS | Status: DC
  Filled 2021-06-20: qty 60

## 2021-06-20 MED ORDER — LIDOCAINE 2% (20 MG/ML) 5 ML SYRINGE
INTRAMUSCULAR | Status: DC | PRN
  Administered 2021-06-20: 60 mg via INTRAVENOUS

## 2021-06-20 MED ORDER — TRANEXAMIC ACID-NACL 1000-0.7 MG/100ML-% IV SOLN
1000.0000 mg | INTRAVENOUS | Status: AC
  Administered 2021-06-20: 1000 mg via INTRAVENOUS

## 2021-06-20 MED ORDER — CHLORHEXIDINE GLUCONATE 0.12 % MT SOLN
OROMUCOSAL | Status: AC
  Administered 2021-06-20: 15 mL via OROMUCOSAL
  Filled 2021-06-20: qty 15

## 2021-06-20 MED ORDER — LACTATED RINGERS IV SOLN: INTRAVENOUS | Status: DC

## 2021-06-20 MED ORDER — PROPOFOL 10 MG/ML IV BOLUS
INTRAVENOUS | Status: AC
  Filled 2021-06-20: qty 20

## 2021-06-20 MED ORDER — BISACODYL 10 MG RE SUPP: 10.0000 mg | Freq: Every day | RECTAL | Status: DC | PRN

## 2021-06-20 MED ORDER — PHENOL 1.4 % MT LIQD
1.0000 | OROMUCOSAL | Status: DC | PRN
Start: 2021-06-20 — End: 2021-06-24

## 2021-06-20 MED ORDER — HYDROCODONE-ACETAMINOPHEN 5-325 MG PO TABS
1.0000 | ORAL_TABLET | ORAL | Status: DC | PRN
  Administered 2021-06-20 – 2021-06-22 (×4): 1 via ORAL
  Filled 2021-06-20 (×4): qty 1

## 2021-06-20 MED ORDER — DOCUSATE SODIUM 100 MG PO CAPS
100.0000 mg | ORAL_CAPSULE | Freq: Two times a day (BID) | ORAL | Status: DC
  Administered 2021-06-21 – 2021-06-23 (×6): 100 mg via ORAL
  Filled 2021-06-20 (×8): qty 1

## 2021-06-20 MED ORDER — CHLORHEXIDINE GLUCONATE 0.12 % MT SOLN
15.0000 mL | Freq: Once | OROMUCOSAL | Status: AC
Start: 2021-06-20 — End: 2021-06-20
  Filled 2021-06-20: qty 15

## 2021-06-20 MED ORDER — CHLORHEXIDINE GLUCONATE 0.12 % MT SOLN: 15.0000 mL | Freq: Once | OROMUCOSAL | Status: DC

## 2021-06-20 MED ORDER — METOPROLOL TARTRATE 50 MG PO TABS
75.0000 mg | ORAL_TABLET | Freq: Two times a day (BID) | ORAL | Status: DC
  Administered 2021-06-20 – 2021-06-21 (×4): 75 mg via ORAL
  Administered 2021-06-22: 50 mg via ORAL
  Filled 2021-06-20: qty 3
  Filled 2021-06-20 (×2): qty 1
  Filled 2021-06-20: qty 3
  Filled 2021-06-20 (×2): qty 1

## 2021-06-20 MED ORDER — TRANEXAMIC ACID-NACL 1000-0.7 MG/100ML-% IV SOLN
INTRAVENOUS | Status: AC
  Filled 2021-06-20: qty 100

## 2021-06-20 MED ORDER — FENTANYL CITRATE (PF) 100 MCG/2ML IJ SOLN
25.0000 ug | INTRAMUSCULAR | Status: DC | PRN
  Administered 2021-06-20 (×3): 50 ug via INTRAVENOUS
  Filled 2021-06-20 (×3): qty 2

## 2021-06-20 MED ORDER — POTASSIUM CHLORIDE IN NACL 40-0.9 MEQ/L-% IV SOLN
INTRAVENOUS | Status: AC
  Filled 2021-06-20: qty 1000

## 2021-06-20 MED ORDER — HYDROCODONE-ACETAMINOPHEN 7.5-325 MG PO TABS
1.0000 | ORAL_TABLET | ORAL | Status: DC | PRN
  Administered 2021-06-22 – 2021-06-23 (×3): 1 via ORAL
  Filled 2021-06-20 (×3): qty 1

## 2021-06-20 MED ORDER — CLOPIDOGREL BISULFATE 75 MG PO TABS
75.0000 mg | ORAL_TABLET | Freq: Every day | ORAL | Status: DC
  Administered 2021-06-21 – 2021-06-24 (×4): 75 mg via ORAL
  Filled 2021-06-20 (×4): qty 1

## 2021-06-20 MED ORDER — ENSURE ENLIVE PO LIQD
237.0000 mL | Freq: Two times a day (BID) | ORAL | Status: DC
  Administered 2021-06-22: 237 mL via ORAL

## 2021-06-20 MED ORDER — ACETAMINOPHEN 500 MG PO TABS
500.0000 mg | ORAL_TABLET | Freq: Four times a day (QID) | ORAL | Status: AC
  Administered 2021-06-20 – 2021-06-21 (×3): 500 mg via ORAL
  Filled 2021-06-20 (×3): qty 1

## 2021-06-20 MED ORDER — ACETAMINOPHEN 500 MG PO TABS: 1000.0000 mg | ORAL_TABLET | Freq: Once | ORAL | Status: DC

## 2021-06-20 MED ORDER — FENTANYL CITRATE (PF) 100 MCG/2ML IJ SOLN
INTRAMUSCULAR | Status: AC
  Filled 2021-06-20: qty 2

## 2021-06-20 MED ORDER — VANCOMYCIN HCL 1250 MG/250ML IV SOLN: 1250.0000 mg | INTRAVENOUS | Status: DC

## 2021-06-20 MED ORDER — ALUM & MAG HYDROXIDE-SIMETH 200-200-20 MG/5ML PO SUSP
30.0000 mL | ORAL | Status: DC | PRN
  Administered 2021-06-21: 30 mL via ORAL
  Filled 2021-06-20: qty 30

## 2021-06-20 MED ORDER — ALPRAZOLAM 0.5 MG PO TABS
1.0000 mg | ORAL_TABLET | Freq: Every evening | ORAL | Status: DC | PRN
  Administered 2021-06-21 – 2021-06-22 (×3): 1 mg via ORAL
  Filled 2021-06-20 (×3): qty 2

## 2021-06-20 MED ORDER — OXYCODONE HCL 5 MG PO TABS
ORAL_TABLET | ORAL | Status: AC
  Filled 2021-06-20: qty 1

## 2021-06-20 MED ORDER — PANTOPRAZOLE SODIUM 40 MG PO TBEC
40.0000 mg | DELAYED_RELEASE_TABLET | Freq: Every day | ORAL | Status: DC
  Administered 2021-06-21 – 2021-06-24 (×4): 40 mg via ORAL
  Filled 2021-06-20 (×5): qty 1

## 2021-06-20 MED ORDER — TRANEXAMIC ACID-NACL 1000-0.7 MG/100ML-% IV SOLN
1000.0000 mg | Freq: Once | INTRAVENOUS | Status: AC
Start: 2021-06-20 — End: 2021-06-20
  Administered 2021-06-20: 1000 mg via INTRAVENOUS
  Filled 2021-06-20: qty 100

## 2021-06-20 MED ORDER — SODIUM CHLORIDE 0.9 % IR SOLN
Status: DC | PRN
  Administered 2021-06-20: 1000 mL

## 2021-06-20 MED ORDER — MORPHINE SULFATE (PF) 4 MG/ML IV SOLN: 4.0000 mg | Freq: Once | INTRAVENOUS | Status: DC

## 2021-06-20 MED ORDER — FENTANYL CITRATE (PF) 250 MCG/5ML IJ SOLN
INTRAMUSCULAR | Status: AC
  Filled 2021-06-20: qty 5

## 2021-06-20 MED ORDER — SENNOSIDES-DOCUSATE SODIUM 8.6-50 MG PO TABS: 1.0000 | ORAL_TABLET | Freq: Every evening | ORAL | Status: DC | PRN

## 2021-06-20 MED ORDER — MAGNESIUM SULFATE IN D5W 1-5 GM/100ML-% IV SOLN
1.0000 g | Freq: Once | INTRAVENOUS | Status: AC
  Administered 2021-06-20: 1 g via INTRAVENOUS
  Filled 2021-06-20: qty 100

## 2021-06-20 MED ORDER — DEXAMETHASONE SODIUM PHOSPHATE 10 MG/ML IJ SOLN
INTRAMUSCULAR | Status: DC | PRN
  Administered 2021-06-20: 10 mg via INTRAVENOUS

## 2021-06-20 MED ORDER — FENTANYL CITRATE (PF) 100 MCG/2ML IJ SOLN
25.0000 ug | INTRAMUSCULAR | Status: DC | PRN
  Administered 2021-06-20: 25 ug via INTRAVENOUS
  Administered 2021-06-20: 50 ug via INTRAVENOUS

## 2021-06-20 MED ORDER — CEFAZOLIN SODIUM-DEXTROSE 2-4 GM/100ML-% IV SOLN
2.0000 g | Freq: Four times a day (QID) | INTRAVENOUS | Status: AC
  Administered 2021-06-20 – 2021-06-21 (×2): 2 g via INTRAVENOUS
  Filled 2021-06-20 (×2): qty 100

## 2021-06-20 MED ORDER — ORAL CARE MOUTH RINSE: 15.0000 mL | Freq: Once | OROMUCOSAL | Status: DC

## 2021-06-20 MED ORDER — CHLORHEXIDINE GLUCONATE CLOTH 2 % EX PADS: 6.0000 | MEDICATED_PAD | Freq: Every day | CUTANEOUS | Status: DC

## 2021-06-20 MED ORDER — MAGNESIUM CITRATE PO SOLN: 1.0000 | Freq: Once | ORAL | Status: DC | PRN

## 2021-06-20 MED ORDER — ONDANSETRON HCL 4 MG PO TABS: 4.0000 mg | ORAL_TABLET | Freq: Four times a day (QID) | ORAL | Status: DC | PRN

## 2021-06-20 MED ORDER — POLYETHYLENE GLYCOL 3350 17 G PO PACK: 17.0000 g | PACK | Freq: Every day | ORAL | Status: DC | PRN

## 2021-06-20 MED ORDER — EPHEDRINE SULFATE 50 MG/ML IJ SOLN
INTRAMUSCULAR | Status: DC | PRN
  Administered 2021-06-20: 10 mg via INTRAVENOUS

## 2021-06-20 MED ORDER — ACETAMINOPHEN 325 MG PO TABS: 325.0000 mg | ORAL_TABLET | Freq: Four times a day (QID) | ORAL | Status: DC | PRN

## 2021-06-20 MED ORDER — POVIDONE-IODINE 10 % EX SWAB
2.0000 | Freq: Once | CUTANEOUS | Status: AC
Start: 2021-06-20 — End: 2021-06-20
  Administered 2021-06-20: 2 via TOPICAL

## 2021-06-20 MED ORDER — ORAL CARE MOUTH RINSE: 15.0000 mL | Freq: Once | OROMUCOSAL | Status: AC

## 2021-06-20 MED ORDER — OXYCODONE HCL 5 MG PO TABS
5.0000 mg | ORAL_TABLET | Freq: Once | ORAL | Status: AC | PRN
Start: 2021-06-20 — End: 2021-06-20
  Administered 2021-06-20: 5 mg via ORAL

## 2021-06-20 MED ORDER — FENTANYL CITRATE (PF) 100 MCG/2ML IJ SOLN
100.0000 ug | Freq: Once | INTRAMUSCULAR | Status: DC
  Filled 2021-06-20: qty 2

## 2021-06-20 MED ORDER — ADULT MULTIVITAMIN W/MINERALS CH
1.0000 | ORAL_TABLET | Freq: Every day | ORAL | Status: DC
  Administered 2021-06-21 – 2021-06-24 (×4): 1 via ORAL
  Filled 2021-06-20 (×4): qty 1

## 2021-06-20 MED ORDER — ONDANSETRON HCL 4 MG/2ML IJ SOLN: 4.0000 mg | Freq: Once | INTRAMUSCULAR | Status: DC | PRN

## 2021-06-20 SURGICAL SUPPLY — 44 items
BAG COUNTER SPONGE SURGICOUNT (BAG) ×2 IMPLANT
BAG SPNG CNTER NS LX DISP (BAG) ×1
CLSR STERI-STRIP ANTIMIC 1/2X4 (GAUZE/BANDAGES/DRESSINGS) ×2 IMPLANT
COVER MAYO STAND STRL (DRAPES) ×2 IMPLANT
COVER PERINEAL POST (MISCELLANEOUS) ×2 IMPLANT
COVER SURGICAL LIGHT HANDLE (MISCELLANEOUS) ×2 IMPLANT
DRAPE STERI IOBAN 125X83 (DRAPES) ×2 IMPLANT
DRESSING MEPILEX FLEX 4X4 (GAUZE/BANDAGES/DRESSINGS) IMPLANT
DRSG MEPILEX BORDER 4X4 (GAUZE/BANDAGES/DRESSINGS) ×6 IMPLANT
DRSG MEPILEX FLEX 4X4 (GAUZE/BANDAGES/DRESSINGS) ×2
DURAPREP 26ML APPLICATOR (WOUND CARE) ×2 IMPLANT
ELECT REM PT RETURN 9FT ADLT (ELECTROSURGICAL) ×2
ELECTRODE REM PT RTRN 9FT ADLT (ELECTROSURGICAL) ×1 IMPLANT
GLOVE SRG 8 PF TXTR STRL LF DI (GLOVE) ×1 IMPLANT
GLOVE SURG ENC MOIS LTX SZ7.5 (GLOVE) ×2 IMPLANT
GLOVE SURG SYN 7.5  E (GLOVE) ×2
GLOVE SURG SYN 7.5 E (GLOVE) ×1 IMPLANT
GLOVE SURG SYN 7.5 PF PI (GLOVE) ×1 IMPLANT
GLOVE SURG UNDER POLY LF SZ7.5 (GLOVE) ×2 IMPLANT
GLOVE SURG UNDER POLY LF SZ8 (GLOVE) ×2
GOWN STRL REUS W/ TWL LRG LVL3 (GOWN DISPOSABLE) ×2 IMPLANT
GOWN STRL REUS W/ TWL XL LVL3 (GOWN DISPOSABLE) ×2 IMPLANT
GOWN STRL REUS W/TWL LRG LVL3 (GOWN DISPOSABLE) ×4
GOWN STRL REUS W/TWL XL LVL3 (GOWN DISPOSABLE) ×4
GUIDEROD T2 3X1000 (ROD) ×1 IMPLANT
K-WIRE  3.2X450M STR (WIRE) ×2
K-WIRE 3.2X450M STR (WIRE) ×1
KIT BASIN OR (CUSTOM PROCEDURE TRAY) ×2 IMPLANT
KIT TURNOVER KIT B (KITS) ×2 IMPLANT
KWIRE 3.2X450M STR (WIRE) IMPLANT
MANIFOLD NEPTUNE II (INSTRUMENTS) ×2 IMPLANT
NAIL 125D 10X320MM LEFT (Nail) ×1 IMPLANT
NS IRRIG 1000ML POUR BTL (IV SOLUTION) ×2 IMPLANT
PACK GENERAL/GYN (CUSTOM PROCEDURE TRAY) ×2 IMPLANT
PAD ARMBOARD 7.5X6 YLW CONV (MISCELLANEOUS) ×4 IMPLANT
REAMER SHAFT BIXCUT (INSTRUMENTS) ×1 IMPLANT
SCREW LAG GAMMA 3 TI 10.5X90MM (Screw) ×1 IMPLANT
STRIP CLOSURE SKIN 1/2X4 (GAUZE/BANDAGES/DRESSINGS) ×2 IMPLANT
SUT MNCRL AB 4-0 PS2 18 (SUTURE) ×2 IMPLANT
SUT VIC AB 2-0 CT1 27 (SUTURE) ×2
SUT VIC AB 2-0 CT1 TAPERPNT 27 (SUTURE) ×1 IMPLANT
TOWEL GREEN STERILE (TOWEL DISPOSABLE) ×2 IMPLANT
TOWEL OR NON WOVEN STRL DISP B (DISPOSABLE) ×2 IMPLANT
WATER STERILE IRR 1000ML POUR (IV SOLUTION) ×2 IMPLANT

## 2021-06-20 NOTE — Progress Notes (Signed)
Bladder scan performed more than 900cc of urine shown.

## 2021-06-20 NOTE — ED Notes (Signed)
RN informed Network engineer to order hospital bed

## 2021-06-20 NOTE — Consult Note (Addendum)
Reason for Consult:Left hip fx Referring Physician: Holli Humbles Time called: 0730 Time at bedside: 0851   Carrie Chung is an 80 y.o. female.  HPI: Carrie Chung was walking on a path outside her home when she tripped and fell onto her left side. She had immediate left hip pain and could not get up. She was brought to the ED where x-rays showed a hip fx and orthopedic surgery was consulted. She c/o pain localized to the hip. She lives at home and does not use any assistive devices to ambulate.  Past Medical History:  Diagnosis Date   Atrial tachycardia ([place])    a. noted at cardiac rehab 5/13; event monitor ordered to assess for AFib   CAD (coronary artery disease)    a. NSTEMI 03/2012 ([place] 03/27/12: pLAD 30%, mLAD 99%, mCFX 95%, mRCA 99% with thrombus, EF 65%);  s/p PTCA/DESx1 to prox-mid RCA 03/27/12 urgently in setting of hypotension/bradycardia, with staged PTCA/Evolve study stent to mid LAD & PTCA/Evolve study stent to prox LCx 03/29/12 ;   echo 03/28/12:EF 60%, Aortic sclerosis without AS, mild RVE, mild reduced RVSF     Endometrial polyp    HTN (hypertension)    Hyperlipidemia    MI, old    Sleep apnea     Past Surgical History:  Procedure Laterality Date   APPENDECTOMY  80 years old   CAROTID STENT  04/2012   x3   [place]   LEFT HEART CATHETERIZATION WITH CORONARY ANGIOGRAM N/A 03/27/2012   Procedure: LEFT HEART CATHETERIZATION WITH CORONARY ANGIOGRAM;  Surgeon: Burnell Blanks, MD;  Location: [facility] CATH LAB;  Service: Cardiovascular;  Laterality: N/A;   PERCUTANEOUS CORONARY STENT INTERVENTION (PCI-S) N/A 03/27/2012   Procedure: PERCUTANEOUS CORONARY STENT INTERVENTION (PCI-S);  Surgeon: Burnell Blanks, MD;  Location: [facility] CATH LAB;  Service: Cardiovascular;  Laterality: N/A;   PERCUTANEOUS CORONARY STENT INTERVENTION (PCI-S) N/A 03/29/2012   Procedure: PERCUTANEOUS CORONARY STENT INTERVENTION (PCI-S);  Surgeon: [name], MD;   Location: [facility] CATH LAB;  Service: Cardiovascular;  Laterality: N/A;   TONSILLECTOMY  80 years old    Family History  Problem Relation Age of Onset   Coronary artery disease Mother        CABG at age 51, PPM   Colon cancer Father    Cancer - Other Father        larnyx    Social History:  reports that she quit smoking about 41 years ago. Her smoking use included cigarettes. She has never used smokeless tobacco. She reports current alcohol use of about 20.0 standard drinks of alcohol per week. She reports that she does not use drugs.  Allergies:  Allergies  Allergen Reactions   Penicillins Hives, Swelling and Other (See Comments)   Sulfonamide Derivatives Hives and Swelling   Sulfamethoxazole Swelling    Medications: I have reviewed the patient's current medications.  Results for orders placed or performed during the hospital encounter of 06/19/21 (from the past 48 hour(s))  Basic metabolic panel     Status: Abnormal   Collection Time: 06/19/21 10:10 PM  Result Value Ref Range   Sodium 136 135 - 145 mmol/L   Potassium 2.8 (L) 3.5 - 5.1 mmol/L   Chloride 96 (L) 98 - 111 mmol/L   CO2 27 22 - 32 mmol/L   Glucose, Bld 122 (H) 70 - 99 mg/dL    Comment: Glucose reference range applies only to samples taken after fasting for at  least 8 hours.   BUN 5 (L) 8 - 23 mg/dL   Creatinine, Ser 0.62 0.44 - 1.00 mg/dL   Calcium 8.6 (L) 8.9 - 10.3 mg/dL   GFR, Estimated >60 >60 mL/min    Comment: (NOTE) Calculated using the CKD-EPI Creatinine Equation (2021)    Anion gap 13 5 - 15    Comment: Performed at [facility] [address], [place], [place] [address]  CBC WITH DIFFERENTIAL     Status: Abnormal   Collection Time: 06/19/21 10:10 PM  Result Value Ref Range   WBC 6.7 4.0 - 10.5 K/uL   RBC 4.02 3.87 - 5.11 MIL/uL   Hemoglobin 13.2 12.0 - 15.0 g/dL   HCT 38.6 36.0 - 46.0 %   MCV 96.0 80.0 - 100.0 fL   MCH 32.8 26.0 - 34.0 pg   MCHC 34.2 30.0 - 36.0 g/dL   RDW 12.9 11.5 -  15.5 %   Platelets 146 (L) 150 - 400 K/uL   nRBC 0.0 0.0 - 0.2 %   Neutrophils Relative % 75 %   Neutro Abs 5.0 1.7 - 7.7 K/uL   Lymphocytes Relative 19 %   Lymphs Abs 1.3 0.7 - 4.0 K/uL   Monocytes Relative 6 %   Monocytes Absolute 0.4 0.1 - 1.0 K/uL   Eosinophils Relative 0 %   Eosinophils Absolute 0.0 0.0 - 0.5 K/uL   Basophils Relative 0 %   Basophils Absolute 0.0 0.0 - 0.1 K/uL   Immature Granulocytes 0 %   Abs Immature Granulocytes 0.03 0.00 - 0.07 K/uL    Comment: Performed at [facility] [address], [place], [place] [address]  Protime-INR     Status: None   Collection Time: 06/19/21 10:10 PM  Result Value Ref Range   Prothrombin Time 13.5 11.4 - 15.2 seconds   INR 1.0 0.8 - 1.2    Comment: (NOTE) INR goal varies based on device and disease states. Performed at [facility], [place] [address], [place], [place] [address]   Type and screen [place]     Status: None   Collection Time: 06/19/21 10:10 PM  Result Value Ref Range   ABO/RH(D) O POS    Antibody Screen NEG    Sample Expiration      06/22/2021,2359 Performed at [facility], [place] [address] [place], [place] [address]   Resp Panel by RT-PCR (Flu A&B, Covid) Nasopharyngeal Swab     Status: None   Collection Time: 06/20/21 12:40 AM   Specimen: Nasopharyngeal Swab; Nasopharyngeal(NP) swabs in vial transport medium  Result Value Ref Range   SARS Coronavirus 2 by RT PCR NEGATIVE NEGATIVE    Comment: (NOTE) SARS-CoV-2 target nucleic acids are NOT DETECTED.  The SARS-CoV-2 RNA is generally detectable in upper respiratory specimens during the acute phase of infection. The lowest concentration of SARS-CoV-2 viral copies this assay can detect is 138 copies/mL. A negative result does not preclude SARS-Cov-2 infection and should not be used as the sole basis for treatment or other patient management decisions. A negative result may occur with  improper specimen  collection/handling, submission of specimen other than nasopharyngeal swab, presence of viral mutation(s) within the areas targeted by this assay, and inadequate number of viral copies(<138 copies/mL). A negative result must be combined with clinical observations, patient history, and epidemiological information. The expected result is Negative.  Fact Sheet for Patients:  EntrepreneurPulse.com.au  Fact Sheet for Healthcare Providers:  IncredibleEmployment.be  This test is no t yet approved or cleared by the [place] and  has been authorized for detection and/or diagnosis of SARS-CoV-2 by FDA under an Emergency Use Authorization (EUA). This EUA will remain  in effect (meaning this test can be used) for the duration of the COVID-19 declaration under Section 564(b)(1) of the Act, 21 U.S.C.section 360bbb-3(b)(1), unless the authorization is terminated  or revoked sooner.       Influenza A by PCR NEGATIVE NEGATIVE   Influenza B by PCR NEGATIVE NEGATIVE    Comment: (NOTE) The Xpert Xpress SARS-CoV-2/FLU/RSV plus assay is intended as an aid in the diagnosis of influenza from Nasopharyngeal swab specimens and should not be used as a sole basis for treatment. Nasal washings and aspirates are unacceptable for Xpert Xpress SARS-CoV-2/FLU/RSV testing.  Fact Sheet for Patients: EntrepreneurPulse.com.au  Fact Sheet for Healthcare Providers: IncredibleEmployment.be  This test is not yet approved or cleared by the [place] FDA and has been authorized for detection and/or diagnosis of SARS-CoV-2 by FDA under an Emergency Use Authorization (EUA). This EUA will remain in effect (meaning this test can be used) for the duration of the COVID-19 declaration under Section 564(b)(1) of the Act, 21 U.S.C. section 360bbb-3(b)(1), unless the authorization is terminated or revoked.  Performed at [facility], [place] [address], [place], [place] [address]   CBC     Status: Abnormal   Collection Time: 06/20/21  3:28 AM  Result Value Ref Range   WBC 7.5 4.0 - 10.5 K/uL   RBC 3.81 (L) 3.87 - 5.11 MIL/uL   Hemoglobin 12.7 12.0 - 15.0 g/dL   HCT 36.7 36.0 - 46.0 %   MCV 96.3 80.0 - 100.0 fL   MCH 33.3 26.0 - 34.0 pg   MCHC 34.6 30.0 - 36.0 g/dL   RDW 13.1 11.5 - 15.5 %   Platelets 128 (L) 150 - 400 K/uL   nRBC 0.0 0.0 - 0.2 %    Comment: Performed at [facility], [place] [address] [place], [place] [address]  Basic metabolic panel     Status: Abnormal   Collection Time: 06/20/21  3:28 AM  Result Value Ref Range   Sodium 135 135 - 145 mmol/L   Potassium 3.4 (L) 3.5 - 5.1 mmol/L   Chloride 95 (L) 98 - 111 mmol/L   CO2 31 22 - 32 mmol/L   Glucose, Bld 150 (H) 70 - 99 mg/dL    Comment: Glucose reference range applies only to samples taken after fasting for at least 8 hours.   BUN 6 (L) 8 - 23 mg/dL   Creatinine, Ser 0.66 0.44 - 1.00 mg/dL   Calcium 8.6 (L) 8.9 - 10.3 mg/dL   GFR, Estimated >60 >60 mL/min    Comment: (NOTE) Calculated using the CKD-EPI Creatinine Equation (2021)    Anion gap 9 5 - 15    Comment: Performed at [facility] [address] [place], [place] [address]  Magnesium     Status: None   Collection Time: 06/20/21  3:28 AM  Result Value Ref Range   Magnesium 2.3 1.7 - 2.4 mg/dL    Comment: Performed at [facility] [address], [place], [place] [address]   *Note: Due to a large number of results and/or encounters for the requested time period, some results have not been displayed. A complete set of results can be found in Results Review.    DG Chest 1 View  Result Date: 06/19/2021 CLINICAL DATA:  Fall EXAM: CHEST  1 VIEW COMPARISON:  11/21/2020 FINDINGS: The heart size and mediastinal contours are within normal limits. Both lungs are clear. The visualized skeletal structures are unremarkable. IMPRESSION: No active disease. Electronically Signed    By: Fidela Salisbury MD   On: 06/19/2021 22:48   DG Wrist Complete Left  Result Date: 06/19/2021 CLINICAL DATA:  Fall, left wrist pain EXAM: LEFT WRIST - COMPLETE 3+ VIEW COMPARISON:  None. FINDINGS: Three view radiograph left wrist demonstrates normal alignment. No fracture or dislocation. Joint spaces are preserved. There is moderate soft tissue swelling dorsal to the a distal radius and ulna. IMPRESSION: Soft tissue swelling.  No fracture or dislocation. Electronically Signed   By: Fidela Salisbury MD   On: 06/19/2021 22:52   CT HEAD WO CONTRAST  Result Date: 06/19/2021 CLINICAL DATA:  Fall, head injury EXAM: CT HEAD WITHOUT CONTRAST TECHNIQUE: Contiguous axial images were obtained from the base of the skull through the vertex without intravenous contrast. COMPARISON:  MRI 01/14/2018 FINDINGS: Brain: Normal anatomic configuration. Parenchymal volume loss is commensurate with the patient's age. Mild periventricular white matter changes are present likely reflecting the sequela of small vessel ischemia. No abnormal intra or extra-axial mass lesion or fluid collection. No abnormal mass effect or midline shift. No evidence of acute intracranial hemorrhage or infarct. Ventricular size is normal. Cerebellum unremarkable. Vascular: No asymmetric hyperdense vasculature at the skull base. Skull: Intact Sinuses/Orbits: Paranasal sinuses are clear. Orbits are unremarkable. Other: Mastoid air cells and middle ear cavities are clear. IMPRESSION: No acute intracranial hemorrhage or infarct.  Mild senescent change. Electronically Signed   By: Fidela Salisbury MD   On: 06/19/2021 22:56   CT Hip Left Wo Contrast  Result Date: 06/19/2021 CLINICAL DATA:  Fall EXAM: CT OF THE LEFT HIP WITHOUT CONTRAST TECHNIQUE: Multidetector CT imaging of the left hip was performed according to the standard protocol. Multiplanar CT image reconstructions were also generated. COMPARISON:  Plain films earlier today FINDINGS:  Bones/Joint/Cartilage Left femoral intertrochanteric fracture noted. The fracture is mildly comminuted and impacted. No subluxation or dislocation. Fracture is acute. Ligaments Suboptimally assessed by CT. Muscles and Tendons Unremarkable Soft tissues Unremarkable. IMPRESSION: Comminuted, impacted acute left femoral intertrochanteric fracture. Electronically Signed   By: Rolm Baptise M.D.   On: 06/19/2021 23:55   DG Hip Unilat With Pelvis 2-3 Views Left  Result Date: 06/19/2021 CLINICAL DATA:  Fall, left hip pain EXAM: DG HIP (WITH OR WITHOUT PELVIS) 2-3V LEFT COMPARISON:  10/04/2017 FINDINGS: There is an age-indeterminate impacted basicervical left hip fracture. There is suggestion of trabecula bridging the fracture plane on lateral examination suggesting that this may be healed, however, this is not optimally assessed on this examination. There is no dislocation. No other fracture identified. Mild left hip degenerative arthritis is noted. IMPRESSION: Age indeterminate basicervical fracture of the left hip. Dedicated CT imaging is recommended for further evaluation. Electronically Signed   By: Fidela Salisbury MD   On: 06/19/2021 22:51    Review of Systems  HENT:  Negative for ear discharge, ear pain, hearing loss and tinnitus.   Eyes:  Negative for photophobia and pain.  Respiratory:  Negative for cough and shortness of breath.   Cardiovascular:  Negative for chest pain.  Gastrointestinal:  Negative for abdominal pain, nausea and vomiting.  Genitourinary:  Negative for dysuria, flank pain, frequency and urgency.  Musculoskeletal:  Positive for arthralgias (Left hip). Negative for back pain, myalgias and neck pain.  Neurological:  Negative for dizziness and headaches.  Hematological:  Does not bruise/bleed easily.  Psychiatric/Behavioral:  The patient is not nervous/anxious.   Blood pressure 139/66, pulse 97, temperature 98.8 F (37.1 C), temperature source Oral, resp. rate 18, height 5\' 1"  (1.549  m), weight 68 kg, SpO2 99 %. Physical Exam Constitutional:      General: She is not in acute distress.    Appearance: She is well-developed. She is not diaphoretic.  HENT:     Head: Normocephalic and atraumatic.  Eyes:     General: No scleral icterus.       Right eye: No discharge.        Left eye: No discharge.     Conjunctiva/sclera: Conjunctivae normal.  Cardiovascular:     Rate and Rhythm: Normal rate and regular rhythm.  Pulmonary:     Effort: Pulmonary effort is normal. No respiratory distress.  Musculoskeletal:     Cervical back: Normal range of motion.     Comments: LLE No traumatic wounds, ecchymosis, or rash  Severe TTP hip  No knee or ankle effusion  Knee stable to varus/ valgus and anterior/posterior stress  Sens DPN, SPN, TN intact  Motor EHL, ext, flex, evers 5/5  DP 1+, PT 0, No significant edema  Skin:    General: Skin is warm and dry.  Neurological:     Mental Status: She is alert.  Psychiatric:        Mood and Affect: Mood normal.        Behavior: Behavior normal.    Assessment/Plan: Left hip fx -- Plan IMN today by Dr. Percell Miller. Please keep NPO. Multiple medical problems including coronary artery disease, atrial tachycardia, hypertension, nocturnal oxygen requirement, depression, and anxiety -- per primary service    Lisette Abu, PA-C Orthopedic Surgery [phone] 06/20/2021, 9:03 AM

## 2021-06-20 NOTE — Progress Notes (Signed)
Initial Nutrition Assessment  DOCUMENTATION CODES:   Not applicable  INTERVENTION:   -Once diet is advanced, add:  -Ensure Enlive po BID, each supplement provides 350 kcal and 20 grams of protein  -MVI with minerals daily  NUTRITION DIAGNOSIS:   Increased nutrient needs related to post-op healing as evidenced by estimated needs.  GOAL:   Patient will meet greater than or equal to 90% of their needs  MONITOR:   PO intake, Supplement acceptance, Diet advancement, Labs, Weight trends, Skin, I & O's  REASON FOR ASSESSMENT:   Consult Hip fracture protocol, Assessment of nutrition requirement/status  ASSESSMENT:   Carrie Chung is a 80 y.o. female with medical history significant for coronary artery disease, atrial tachycardia, hypertension, nocturnal oxygen requirement, depression, and anxiety, now presenting to the emergency department for evaluation of left hip pain after a fall.  The patient reports that she was in her usual state of health and having an uneventful day when she tripped on an uneven surface, landed on her left side, suffered abrasions to her left scalp, wrist, and elbow, and has been experiencing severe left hip pain.  She notes that she is typically active but has been limited in the past couple months due to increase in her chronic back pain.  Her back pain had improved significantly in March when she had an epidural injection but has since began to bother her again.  She is able to ascend a flight of stairs without any chest pain but does develop some shortness of breath with that level of activity.  She was treated with fentanyl by EMS.  Pt admitted with lt hip fracture.   Per orthopedics notes, plan for IMN today. Pt currently NPO for procedure.   Pt unavailable at time of visit. RD unable to obtain further nutrition-related history or complete nutrition-focused physical exam at this time.    Reviewed wt hx; pt has experienced a 7.5% wt loss over the  past 2 months, which is significant for time frame.   Pt with increased nutritional needs for post-operative healing and would benefit from addition of oral nutrition supplements.   Medications reviewed and include lactated ringers infusion @ 10 ml/hr.   Lab Results  Component Value Date   HGBA1C 6.2 (H) 02/18/2021   PTA DM medications are none.   Labs reviewed: K: 3.4 (on PO supplementation).  Diet Order:   Diet Order             Diet NPO time specified  Diet effective now                   EDUCATION NEEDS:   No education needs have been identified at this time  Skin:  Skin Assessment: Reviewed RN Assessment  Last BM:  Unknown  Height:   Ht Readings from Last 1 Encounters:  06/19/21 5\' 1"  (1.549 m)    Weight:   Wt Readings from Last 1 Encounters:  06/19/21 68 kg    Ideal Body Weight:  47.7 kg  BMI:  Body mass index is 28.34 kg/m.  Estimated Nutritional Needs:   Kcal:  1850-2050  Protein:  105-120 grams  Fluid:  > 1.8 L    Loistine Chance, RD, LDN, [place] Registered Dietitian II Certified Diabetes Care and Education Specialist Please refer to [facility] for RD and/or RD on-call/weekend/after hours pager

## 2021-06-20 NOTE — Anesthesia Preprocedure Evaluation (Addendum)
Anesthesia Evaluation  Patient identified by MRN, date of birth, ID band Patient awake    Reviewed: Allergy & Precautions, NPO status , Patient's Chart, lab work & pertinent test results  Airway Mallampati: I  TM Distance: >3 FB Neck ROM: Full    Dental  (+) Teeth Intact, Missing, Dental Advisory Given,    Pulmonary shortness of breath (nocturnal O2 for sleep hypoxia), with exertion and Long-Term Oxygen Therapy, neg sleep apnea, former smoker,  Quit smoking 1981   Pulmonary exam normal breath sounds clear to auscultation       Cardiovascular hypertension, Pt. on medications + CAD, + Past MI, + Cardiac Stents (DES to RCA 2013, LCX) and +CHF (grade 1 diastolic dysfunction)  Normal cardiovascular exam Rhythm:Regular Rate:Normal  Echo 2016: - Left ventricle: The cavity size was normal. Wall thickness was  normal. Systolic function was normal. The estimated ejection  fraction was in the range of 60% to 65%. Wall motion was normal;  there were no regional wall motion abnormalities. Doppler  parameters are consistent with abnormal left ventricular  relaxation (grade 1 diastolic dysfunction).  - Mitral valve: Calcified annulus. Normal thickness leaflets .  - Atrial septum: No defect or patent foramen ovale was identified.  Echo contrast study showed no right-to-left atrial level shunt,  at baseline or with provocation.    Neuro/Psych PSYCHIATRIC DISORDERS Anxiety Depression negative neurological ROS     GI/Hepatic GERD  Medicated and Controlled,(+)     substance abuse  alcohol use,   Endo/Other  negative endocrine ROS  Renal/GU negative Renal ROS  negative genitourinary   Musculoskeletal L hip intertrochanteric fx   Abdominal   Peds negative pediatric ROS (+)  Hematology negative hematology ROS (+) hct 36.7, plt 128   Anesthesia Other Findings Last took plavix 2d ago  Reproductive/Obstetrics negative OB  ROS                            Anesthesia Physical Anesthesia Plan  ASA: 3  Anesthesia Plan: General   Post-op Pain Management:    Induction: Intravenous  PONV Risk Score and Plan: 3 and Ondansetron, Dexamethasone and Treatment may vary due to age or medical condition  Airway Management Planned: Oral ETT  Additional Equipment: None  Intra-op Plan:   Post-operative Plan: Extubation in OR  Informed Consent: I have reviewed the patients History and Physical, chart, labs and discussed the procedure including the risks, benefits and alternatives for the proposed anesthesia with the patient or authorized representative who has indicated his/her understanding and acceptance.     Dental advisory given  Plan Discussed with: CRNA  Anesthesia Plan Comments:         Anesthesia Quick Evaluation

## 2021-06-20 NOTE — ED Notes (Signed)
BP low at time of administration of medications. Medications held at this time. Will monitor VS. Patient medicated for pain 8/10 to left hip. Reports muscle spasms intermittently.

## 2021-06-20 NOTE — Transfer of Care (Signed)
Immediate Anesthesia Transfer of Care Note  Patient: Carrie Chung  Procedure(s) Performed: INTRAMEDULLARY (IM) NAIL INTERTROCHANTRIC (Left)  Patient Location: PACU  Anesthesia Type:General  Level of Consciousness: awake, alert  and oriented  Airway & Oxygen Therapy: Patient Spontanous Breathing and Patient connected to face mask  Post-op Assessment: Report given to RN and Post -op Vital signs reviewed and stable  Post vital signs: Reviewed and stable  Last Vitals:  Vitals Value Taken Time  BP    Temp    Pulse 86 06/20/21 1532  Resp 11 06/20/21 1532  SpO2 95 % 06/20/21 1532  Vitals shown include unvalidated device data.  Last Pain:  Vitals:   06/20/21 1320  TempSrc:   PainSc: 8       Patients Stated Pain Goal: 4 (70/78/67 5449)  Complications: No notable events documented.

## 2021-06-20 NOTE — Progress Notes (Signed)
PROGRESS NOTE    Carrie Chung  NWG:[id number] DOB: 1941/02/11 DOA: 06/19/2021 PCP: [name], MD   Brief Narrative:  Carrie Chung is a 80 y.o. female with medical history significant for coronary artery disease, atrial tachycardia, hypertension, nocturnal oxygen requirement, depression, and anxiety, now presenting to the emergency department for evaluation of left hip pain after a fall.  CT of the left hip demonstrates comminuted impacted acute left femoral intertrochanteric fracture.  Orthopedic surgery was consulted by the ED physician and hospitalists asked to admit.  Assessment & Plan:   Principal Problem:   Closed left hip fracture, initial encounter ([place]) Active Problems:   HYPERTENSION, BENIGN   Prolonged QT interval   Coronary Artery Disease   Depression with anxiety   Hypoxemic respiratory failure, chronic (HCC)   Hypokalemia   Left hip fracture status post mechanical fall -Orthopedics following, appreciate insight and recommendation -ORIF per surgery schedule - Hold ASA and Plavix, continue NPO and pain-control    Prolonged QT interval  - QTc is 540 ms in setting of hypokalemia  - Continue cardiac monitoring, replace potassium, check magnesium, minimize QT-prolonging medications, repeat EKG shows sinus rhythm   Hypokalemia  - Serum potassium 2.8 on admission; repleted appropriately, follow-up morning labs   Obstructive CAD history status post stent 2013 - No recent issues or events from a cardiac standpoint - Hold antiplatelets, continue statin and beta-blocker     Depression, anxiety  - Continue SNRI and as-needed Xanax   Hypertension  - BP at goal - Hold HCTZ, continue Norvasc and metoprolol as tolerated     DVT prophylaxis: SCDs  Code Status: Full   Status is: Inpatient  Dispo: The patient is from: Home              Anticipated d/c is to: To be determined              Anticipated d/c date is: 48 to 72 hours              Patient  currently not medically stable for discharge  Consultants:  Orthopedic surgery  Procedures:  ORIF left hip per orthopedic surgery schedule  Antimicrobials:  None indicated  Subjective: No acute issues or events overnight, pain well controlled  Objective: Vitals:   06/20/21 0545 06/20/21 0630 06/20/21 0645 06/20/21 0715  BP: 131/66 (!) 144/69 134/68 139/66  Pulse: 98 98 92 97  Resp: 15 16 19 18   Temp:      TempSrc:      SpO2: 98% 99% 99% 99%  Weight:      Height:       No intake or output data in the 24 hours ending 06/20/21 0818 Filed Weights   06/19/21 2130  Weight: 68 kg    Examination:  General exam: Appears calm and comfortable  Respiratory system: Clear to auscultation. Respiratory effort normal. Cardiovascular system: S1 & S2 heard, RRR. No JVD, murmurs, rubs, gallops or clicks. No pedal edema. Gastrointestinal system: Abdomen is nondistended, soft and nontender. No organomegaly or masses felt. Normal bowel sounds heard. Central nervous system: Alert and oriented. No focal neurological deficits. Extremities: Symmetric 5 x 5 power. Skin: No rashes, lesions or ulcers Psychiatry: Judgement and insight appear normal. Mood & affect appropriate.   Data Reviewed: I have personally reviewed following labs and imaging studies  CBC: Recent Labs  Lab 06/19/21 2210 06/20/21 0328  WBC 6.7 7.5  NEUTROABS 5.0  --   HGB 13.2 12.7  HCT 38.6  36.7  MCV 96.0 96.3  PLT 146* 295*   Basic Metabolic Panel: Recent Labs  Lab 06/19/21 2210 06/20/21 0328  NA 136 135  K 2.8* 3.4*  CL 96* 95*  CO2 27 31  GLUCOSE 122* 150*  BUN 5* 6*  CREATININE 0.62 0.66  CALCIUM 8.6* 8.6*  MG  --  2.3   GFR: Estimated Creatinine Clearance: 49.5 mL/min (by C-G formula based on SCr of 0.66 mg/dL). Liver Function Tests: No results for input(s): AST, ALT, ALKPHOS, BILITOT, PROT, ALBUMIN in the last 168 hours. No results for input(s): LIPASE, AMYLASE in the last 168 hours. No results  for input(s): AMMONIA in the last 168 hours. Coagulation Profile: Recent Labs  Lab 06/19/21 2210  INR 1.0   Cardiac Enzymes: No results for input(s): CKTOTAL, CKMB, CKMBINDEX, TROPONINI in the last 168 hours. BNP (last 3 results) No results for input(s): PROBNP in the last 8760 hours. HbA1C: No results for input(s): HGBA1C in the last 72 hours. CBG: No results for input(s): GLUCAP in the last 168 hours. Lipid Profile: No results for input(s): CHOL, HDL, LDLCALC, TRIG, CHOLHDL, LDLDIRECT in the last 72 hours. Thyroid Function Tests: No results for input(s): TSH, T4TOTAL, FREET4, T3FREE, THYROIDAB in the last 72 hours. Anemia Panel: No results for input(s): VITAMINB12, FOLATE, FERRITIN, TIBC, IRON, RETICCTPCT in the last 72 hours. Sepsis Labs: No results for input(s): PROCALCITON, LATICACIDVEN in the last 168 hours.  Recent Results (from the past 240 hour(s))  Resp Panel by RT-PCR (Flu A&B, Covid) Nasopharyngeal Swab     Status: None   Collection Time: 06/20/21 12:40 AM   Specimen: Nasopharyngeal Swab; Nasopharyngeal(NP) swabs in vial transport medium  Result Value Ref Range Status   SARS Coronavirus 2 by RT PCR NEGATIVE NEGATIVE Final    Comment: (NOTE) SARS-CoV-2 target nucleic acids are NOT DETECTED.  The SARS-CoV-2 RNA is generally detectable in upper respiratory specimens during the acute phase of infection. The lowest concentration of SARS-CoV-2 viral copies this assay can detect is 138 copies/mL. A negative result does not preclude SARS-Cov-2 infection and should not be used as the sole basis for treatment or other patient management decisions. A negative result may occur with  improper specimen collection/handling, submission of specimen other than nasopharyngeal swab, presence of viral mutation(s) within the areas targeted by this assay, and inadequate number of viral copies(<138 copies/mL). A negative result must be combined with clinical observations, patient  history, and epidemiological information. The expected result is Negative.  Fact Sheet for Patients:  EntrepreneurPulse.com.au  Fact Sheet for Healthcare Providers:  IncredibleEmployment.be  This test is no t yet approved or cleared by the [place] FDA and  has been authorized for detection and/or diagnosis of SARS-CoV-2 by FDA under an Emergency Use Authorization (EUA). This EUA will remain  in effect (meaning this test can be used) for the duration of the COVID-19 declaration under Section 564(b)(1) of the Act, 21 U.S.C.section 360bbb-3(b)(1), unless the authorization is terminated  or revoked sooner.       Influenza A by PCR NEGATIVE NEGATIVE Final   Influenza B by PCR NEGATIVE NEGATIVE Final    Comment: (NOTE) The Xpert Xpress SARS-CoV-2/FLU/RSV plus assay is intended as an aid in the diagnosis of influenza from Nasopharyngeal swab specimens and should not be used as a sole basis for treatment. Nasal washings and aspirates are unacceptable for Xpert Xpress SARS-CoV-2/FLU/RSV testing.  Fact Sheet for Patients: EntrepreneurPulse.com.au  Fact Sheet for Healthcare Providers: IncredibleEmployment.be  This test is not  yet approved or cleared by the [place] and has been authorized for detection and/or diagnosis of SARS-CoV-2 by FDA under an Emergency Use Authorization (EUA). This EUA will remain in effect (meaning this test can be used) for the duration of the COVID-19 declaration under Section 564(b)(1) of the Act, 21 U.S.C. section 360bbb-3(b)(1), unless the authorization is terminated or revoked.  Performed at [facility], [place] [address] [place], [place] [address]          Radiology Studies: DG Chest 1 View  Result Date: 06/19/2021 CLINICAL DATA:  Fall EXAM: CHEST  1 VIEW COMPARISON:  11/21/2020 FINDINGS: The heart size and mediastinal contours are within normal limits.  Both lungs are clear. The visualized skeletal structures are unremarkable. IMPRESSION: No active disease. Electronically Signed   By: Fidela Salisbury MD   On: 06/19/2021 22:48   DG Wrist Complete Left  Result Date: 06/19/2021 CLINICAL DATA:  Fall, left wrist pain EXAM: LEFT WRIST - COMPLETE 3+ VIEW COMPARISON:  None. FINDINGS: Three view radiograph left wrist demonstrates normal alignment. No fracture or dislocation. Joint spaces are preserved. There is moderate soft tissue swelling dorsal to the a distal radius and ulna. IMPRESSION: Soft tissue swelling.  No fracture or dislocation. Electronically Signed   By: Fidela Salisbury MD   On: 06/19/2021 22:52   CT HEAD WO CONTRAST  Result Date: 06/19/2021 CLINICAL DATA:  Fall, head injury EXAM: CT HEAD WITHOUT CONTRAST TECHNIQUE: Contiguous axial images were obtained from the base of the skull through the vertex without intravenous contrast. COMPARISON:  MRI 01/14/2018 FINDINGS: Brain: Normal anatomic configuration. Parenchymal volume loss is commensurate with the patient's age. Mild periventricular white matter changes are present likely reflecting the sequela of small vessel ischemia. No abnormal intra or extra-axial mass lesion or fluid collection. No abnormal mass effect or midline shift. No evidence of acute intracranial hemorrhage or infarct. Ventricular size is normal. Cerebellum unremarkable. Vascular: No asymmetric hyperdense vasculature at the skull base. Skull: Intact Sinuses/Orbits: Paranasal sinuses are clear. Orbits are unremarkable. Other: Mastoid air cells and middle ear cavities are clear. IMPRESSION: No acute intracranial hemorrhage or infarct.  Mild senescent change. Electronically Signed   By: Fidela Salisbury MD   On: 06/19/2021 22:56   CT Hip Left Wo Contrast  Result Date: 06/19/2021 CLINICAL DATA:  Fall EXAM: CT OF THE LEFT HIP WITHOUT CONTRAST TECHNIQUE: Multidetector CT imaging of the left hip was performed according to the standard  protocol. Multiplanar CT image reconstructions were also generated. COMPARISON:  Plain films earlier today FINDINGS: Bones/Joint/Cartilage Left femoral intertrochanteric fracture noted. The fracture is mildly comminuted and impacted. No subluxation or dislocation. Fracture is acute. Ligaments Suboptimally assessed by CT. Muscles and Tendons Unremarkable Soft tissues Unremarkable. IMPRESSION: Comminuted, impacted acute left femoral intertrochanteric fracture. Electronically Signed   By: Rolm Baptise M.D.   On: 06/19/2021 23:55   DG Hip Unilat With Pelvis 2-3 Views Left  Result Date: 06/19/2021 CLINICAL DATA:  Fall, left hip pain EXAM: DG HIP (WITH OR WITHOUT PELVIS) 2-3V LEFT COMPARISON:  10/04/2017 FINDINGS: There is an age-indeterminate impacted basicervical left hip fracture. There is suggestion of trabecula bridging the fracture plane on lateral examination suggesting that this may be healed, however, this is not optimally assessed on this examination. There is no dislocation. No other fracture identified. Mild left hip degenerative arthritis is noted. IMPRESSION: Age indeterminate basicervical fracture of the left hip. Dedicated CT imaging is recommended for further evaluation. Electronically Signed   By:  Fidela Salisbury MD   On: 06/19/2021 22:51     Scheduled Meds:  amLODipine  5 mg Oral Daily   fentaNYL (SUBLIMAZE) injection  100 mcg Intravenous Once   metoprolol tartrate  75 mg Oral BID   pantoprazole  40 mg Oral Daily   potassium chloride  20 mEq Oral BID   rosuvastatin  20 mg Oral QHS   venlafaxine XR  75 mg Oral Q breakfast   Continuous Infusions:  0.9 % NaCl with KCl 40 mEq / L 100 mL/hr at 06/20/21 0154   methocarbamol (ROBAXIN) IV 500 mg (06/20/21 0810)     LOS: 0 days   Time spent: 74min  Rosalynn Sergent C Nalani Andreen, DO [facility]  If 7PM-7AM, please contact night-coverage [email or web address]  06/20/2021, 8:18 AM

## 2021-06-20 NOTE — ED Notes (Signed)
Medications administered with small sip of water. Patient transported to OR.

## 2021-06-20 NOTE — H&P (Signed)
History and Physical    Carrie Chung QJJ:[id number] DOB: [birth year] DOA: 06/19/2021  PCP: [name], MD   Patient coming from: Home   Chief Complaint: Left hip pain   HPI: Carrie Chung is a 79 y.o. female with medical history significant for coronary artery disease, atrial tachycardia, hypertension, nocturnal oxygen requirement, depression, and anxiety, now presenting to the emergency department for evaluation of left hip pain after a fall.  The patient reports that she was in her usual state of health and having an uneventful day when she tripped on an uneven surface, landed on her left side, suffered abrasions to her left scalp, wrist, and elbow, and has been experiencing severe left hip pain.  She notes that she is typically active but has been limited in the past couple months due to increase in her chronic back pain.  Her back pain had improved significantly in March when she had an epidural injection but has since began to bother her again.  She is able to ascend a flight of stairs without any chest pain but does develop some shortness of breath with that level of activity.  She was treated with fentanyl by EMS.  ED Course: Upon arrival to the ED, patient is found to be afebrile, saturating upper 90s on 2 L/min of supplemental oxygen, and with stable blood pressure.  EKG features sinus rhythm with QTC of 540 ms.  Chemistry panel notable for potassium 2.8.  Chest x-ray negative for acute cardiopulmonary disease.  Radiographs of the hip and pelvis concerning for fracture of the left hip.  CT head is negative for acute intracranial abnormality.  CT of the left hip demonstrates comminuted impacted acute left femoral intertrochanteric fracture.  Orthopedic surgery was consulted by the ED physician and hospitalists asked to admit.  Review of Systems:  All other systems reviewed and apart from HPI, are negative.  Past Medical History:  Diagnosis Date   Atrial tachycardia ([place])     a. noted at cardiac rehab 5/13; event monitor ordered to assess for AFib   CAD (coronary artery disease)    a. NSTEMI 03/2012 ([place] 03/27/12: pLAD 30%, mLAD 99%, mCFX 95%, mRCA 99% with thrombus, EF 65%);  s/p PTCA/DESx1 to prox-mid RCA 03/27/12 urgently in setting of hypotension/bradycardia, with staged PTCA/Evolve study stent to mid LAD & PTCA/Evolve study stent to prox LCx 03/29/12 ;   echo 03/28/12:EF 60%, Aortic sclerosis without AS, mild RVE, mild reduced RVSF     Endometrial polyp    HTN (hypertension)    Hyperlipidemia    MI, old    Sleep apnea     Past Surgical History:  Procedure Laterality Date   APPENDECTOMY  80 years old   CAROTID STENT  04/2012   x3   [place]   LEFT HEART CATHETERIZATION WITH CORONARY ANGIOGRAM N/A 03/27/2012   Procedure: LEFT HEART CATHETERIZATION WITH CORONARY ANGIOGRAM;  Surgeon: Burnell Blanks, MD;  Location: [facility] CATH LAB;  Service: Cardiovascular;  Laterality: N/A;   PERCUTANEOUS CORONARY STENT INTERVENTION (PCI-S) N/A 03/27/2012   Procedure: PERCUTANEOUS CORONARY STENT INTERVENTION (PCI-S);  Surgeon: Burnell Blanks, MD;  Location: [facility] CATH LAB;  Service: Cardiovascular;  Laterality: N/A;   PERCUTANEOUS CORONARY STENT INTERVENTION (PCI-S) N/A 03/29/2012   Procedure: PERCUTANEOUS CORONARY STENT INTERVENTION (PCI-S);  Surgeon: [name], MD;  Location: [facility] C[facility] LAB;  Service: Cardiovascular;  Laterality: N/A;   TONSILLECTOMY  80 years old    Social History:  reports that she quit smoking about 41 years ago. Her smoking use included cigarettes. She has never used smokeless tobacco. She reports current alcohol use of about 20.0 standard drinks of alcohol per week. She reports that she does not use drugs.  Allergies  Allergen Reactions   Penicillins Hives, Swelling and Other (See Comments)   Sulfonamide Derivatives Hives and Swelling   Sulfamethoxazole Swelling    Family History  Problem Relation Age of Onset    Coronary artery disease Mother        CABG at age 86, PPM   Colon cancer Father    Cancer - Other Father        larnyx     Prior to Admission medications   Medication Sig Start Date End Date Taking? Authorizing Provider  acetaminophen (TYLENOL) 500 MG tablet Take 1,000 mg by mouth every 6 (six) hours as needed for moderate pain or headache.   Yes [provider]  ALPRAZolam (XANAX) 1 MG tablet TAKE 1 TABLET(1 MG) BY MOUTH AT BEDTIME AS NEEDED FOR SLEEP Patient taking differently: Take 1 mg by mouth at bedtime as needed for sleep. 04/19/21  Yes Baxley, Cresenciano Lick, MD  amLODipine (NORVASC) 5 MG tablet TAKE 1 TABLET BY MOUTH EVERY DAY Patient taking differently: Take 5 mg by mouth daily. 08/27/20  Yes Baxley, Cresenciano Lick, MD  aspirin 81 MG tablet Take 1 tablet (81 mg total) by mouth daily. 03/30/12  Yes Dunn, Dayna N, PA-C  Calcium Carbonate-Vitamin D 600-400 MG-UNIT tablet Take 1 tablet by mouth every evening.   Yes [provider]  clopidogrel (PLAVIX) 75 MG tablet TAKE 1 TABLET(75 MG) BY MOUTH DAILY Patient taking differently: Take 75 mg by mouth at bedtime. 02/24/21  Yes [name], MD  desvenlafaxine (PRISTIQ) 50 MG 24 hr tablet TAKE 1 TABLET(50 MG) BY MOUTH DAILY Patient taking differently: Take 50 mg by mouth daily. 04/19/21  Yes Baxley, Cresenciano Lick, MD  FOLBIC 2.5-25-2 MG TABS tablet TAKE 1 TABLET BY MOUTH DAILY 02/25/21  Yes Baxley, Cresenciano Lick, MD  hydrochlorothiazide (HYDRODIURIL) 25 MG tablet TAKE 1 TABLET BY MOUTH EVERY DAY Patient taking differently: Take 25 mg by mouth daily. 04/29/21  Yes [name], MD  metoprolol tartrate (LOPRESSOR) 50 MG tablet TAKE 1 AND 1/2 TABLETS(75 MG) BY MOUTH TWICE DAILY Patient taking differently: Take 75 mg by mouth 2 (two) times daily. 12/24/20  Yes [name], MD  nitroGLYCERIN (NITROSTAT) 0.4 MG SL tablet Place 1 tablet (0.4 mg total) under the tongue every 5 (five) minutes as needed for chest pain. 12/01/19  Yes [name], MD   OXYGEN Inhale into the lungs. Per patient using 2 liters at night.   Yes [provider]  rosuvastatin (CRESTOR) 20 MG tablet TAKE 1 TABLET(20 MG) BY MOUTH DAILY Patient taking differently: Take 20 mg by mouth at bedtime. 05/26/21  Yes [name], MD  esomeprazole (NEXIUM) 40 MG capsule Take 40 mg by mouth daily at 12 noon.    [provider]  HYDROcodone-homatropine (HYCODAN) 5-1.5 MG/5ML syrup Take 5 mLs by mouth every 8 (eight) hours as needed for cough. Patient not taking: No sig reported 04/15/21   [name], MD  triamcinolone cream (KENALOG) 0.1 % Apply 1 application topically 3 (three) times daily. Patient not taking: No sig reported 08/15/19   [name], MD    Physical Exam: Vitals:   06/19/21 2126 06/19/21 2130 06/19/21 2134 06/20/21 0015  BP:   126/72 128/64  Pulse:  83 93  Resp:   16 11  Temp:   98.8 F (37.1 C)   TempSrc:   Oral   SpO2: 93%  98% 97%  Weight:  68 kg    Height:  5\' 1"  (1.549 m)      Constitutional: NAD, calm  Eyes: PERTLA, lids and conjunctivae normal ENMT: Mucous membranes are moist. Posterior pharynx clear of any exudate or lesions.   Neck:  supple, no masses  Respiratory:  no wheezing, no crackles. No accessory muscle use.  Cardiovascular: S1 & S2 heard, regular rate and rhythm. No extremity edema.  Abdomen: No distension, no tenderness, soft. Bowel sounds active.  Musculoskeletal: no clubbing / cyanosis. Left hip tender, neurovascularly intact distally.   Skin: no significant rashes, lesions, ulcers. Warm, dry, well-perfused. Neurologic: CN 2-12 grossly intact. Sensation intact. Moving all extremities.  Psychiatric: Alert and oriented to person, place, and situation. Pleasant and cooperative.    Labs and Imaging on Admission: I have personally reviewed following labs and imaging studies  CBC: Recent Labs  Lab 06/19/21 2210  WBC 6.7  NEUTROABS 5.0  HGB 13.2  HCT 38.6  MCV 96.0  PLT 462*   Basic Metabolic  Panel: Recent Labs  Lab 06/19/21 2210  NA 136  K 2.8*  CL 96*  CO2 27  GLUCOSE 122*  BUN 5*  CREATININE 0.62  CALCIUM 8.6*   GFR: Estimated Creatinine Clearance: 49.5 mL/min (by C-G formula based on SCr of 0.62 mg/dL). Liver Function Tests: No results for input(s): AST, ALT, ALKPHOS, BILITOT, PROT, ALBUMIN in the last 168 hours. No results for input(s): LIPASE, AMYLASE in the last 168 hours. No results for input(s): AMMONIA in the last 168 hours. Coagulation Profile: Recent Labs  Lab 06/19/21 2210  INR 1.0   Cardiac Enzymes: No results for input(s): CKTOTAL, CKMB, CKMBINDEX, TROPONINI in the last 168 hours. BNP (last 3 results) No results for input(s): PROBNP in the last 8760 hours. HbA1C: No results for input(s): HGBA1C in the last 72 hours. CBG: No results for input(s): GLUCAP in the last 168 hours. Lipid Profile: No results for input(s): CHOL, HDL, LDLCALC, TRIG, CHOLHDL, LDLDIRECT in the last 72 hours. Thyroid Function Tests: No results for input(s): TSH, T4TOTAL, FREET4, T3FREE, THYROIDAB in the last 72 hours. Anemia Panel: No results for input(s): VITAMINB12, FOLATE, FERRITIN, TIBC, IRON, RETICCTPCT in the last 72 hours. Urine analysis:    Component Value Date/Time   COLORURINE YELLOW 05/10/2009 1142   APPEARANCEUR CLEAR 05/10/2009 1142   LABSPEC 1.010 05/10/2009 1142   PHURINE 7.5 05/10/2009 1142   GLUCOSEU NEGATIVE 05/10/2009 1142   HGBUR NEGATIVE 05/10/2009 1142   BILIRUBINUR NEG 08/09/2018 1724   KETONESUR NEGATIVE 05/10/2009 1142   PROTEINUR Negative 08/09/2018 1724   PROTEINUR NEGATIVE 05/10/2009 1142   UROBILINOGEN 0.2 08/09/2018 1724   UROBILINOGEN 0.2 05/10/2009 1142   NITRITE NEG 08/09/2018 1724   NITRITE NEGATIVE 05/10/2009 1142   LEUKOCYTESUR Negative 08/09/2018 1724   Sepsis Labs: @LABRCNTIP (procalcitonin:4,lacticidven:4) ) Recent Results (from the past 240 hour(s))  Resp Panel by RT-PCR (Flu A&B, Covid) Nasopharyngeal Swab     Status:  None   Collection Time: 06/20/21 12:40 AM   Specimen: Nasopharyngeal Swab; Nasopharyngeal(NP) swabs in vial transport medium  Result Value Ref Range Status   SARS Coronavirus 2 by RT PCR NEGATIVE NEGATIVE Final    Comment: (NOTE) SARS-CoV-2 target nucleic acids are NOT DETECTED.  The SARS-CoV-2 RNA is generally detectable in upper respiratory specimens during the acute phase of infection.  The lowest concentration of SARS-CoV-2 viral copies this assay can detect is 138 copies/mL. A negative result does not preclude SARS-Cov-2 infection and should not be used as the sole basis for treatment or other patient management decisions. A negative result may occur with  improper specimen collection/handling, submission of specimen other than nasopharyngeal swab, presence of viral mutation(s) within the areas targeted by this assay, and inadequate number of viral copies(<138 copies/mL). A negative result must be combined with clinical observations, patient history, and epidemiological information. The expected result is Negative.  Fact Sheet for Patients:  EntrepreneurPulse.com.au  Fact Sheet for Healthcare Providers:  IncredibleEmployment.be  This test is no t yet approved or cleared by the [place] FDA and  has been authorized for detection and/or diagnosis of SARS-CoV-2 by FDA under an Emergency Use Authorization (EUA). This EUA will remain  in effect (meaning this test can be used) for the duration of the COVID-19 declaration under Section 564(b)(1) of the Act, 21 U.S.C.section 360bbb-3(b)(1), unless the authorization is terminated  or revoked sooner.       Influenza A by PCR NEGATIVE NEGATIVE Final   Influenza B by PCR NEGATIVE NEGATIVE Final    Comment: (NOTE) The Xpert Xpress SARS-CoV-2/FLU/RSV plus assay is intended as an aid in the diagnosis of influenza from Nasopharyngeal swab specimens and should not be used as a sole basis for  treatment. Nasal washings and aspirates are unacceptable for Xpert Xpress SARS-CoV-2/FLU/RSV testing.  Fact Sheet for Patients: EntrepreneurPulse.com.au  Fact Sheet for Healthcare Providers: IncredibleEmployment.be  This test is not yet approved or cleared by the [place] FDA and has been authorized for detection and/or diagnosis of SARS-CoV-2 by FDA under an Emergency Use Authorization (EUA). This EUA will remain in effect (meaning this test can be used) for the duration of the COVID-19 declaration under Section 564(b)(1) of the Act, 21 U.S.C. section 360bbb-3(b)(1), unless the authorization is terminated or revoked.  Performed at [facility], [place] [address], [place], [place] [address]      Radiological Exams on Admission: DG Chest 1 View  Result Date: 06/19/2021 CLINICAL DATA:  Fall EXAM: CHEST  1 VIEW COMPARISON:  11/21/2020 FINDINGS: The heart size and mediastinal contours are within normal limits. Both lungs are clear. The visualized skeletal structures are unremarkable. IMPRESSION: No active disease. Electronically Signed   By: Fidela Salisbury MD   On: 06/19/2021 22:48   DG Wrist Complete Left  Result Date: 06/19/2021 CLINICAL DATA:  Fall, left wrist pain EXAM: LEFT WRIST - COMPLETE 3+ VIEW COMPARISON:  None. FINDINGS: Three view radiograph left wrist demonstrates normal alignment. No fracture or dislocation. Joint spaces are preserved. There is moderate soft tissue swelling dorsal to the a distal radius and ulna. IMPRESSION: Soft tissue swelling.  No fracture or dislocation. Electronically Signed   By: Fidela Salisbury MD   On: 06/19/2021 22:52   CT HEAD WO CONTRAST  Result Date: 06/19/2021 CLINICAL DATA:  Fall, head injury EXAM: CT HEAD WITHOUT CONTRAST TECHNIQUE: Contiguous axial images were obtained from the base of the skull through the vertex without intravenous contrast. COMPARISON:  MRI 01/14/2018 FINDINGS: Brain: Normal  anatomic configuration. Parenchymal volume loss is commensurate with the patient's age. Mild periventricular white matter changes are present likely reflecting the sequela of small vessel ischemia. No abnormal intra or extra-axial mass lesion or fluid collection. No abnormal mass effect or midline shift. No evidence of acute intracranial hemorrhage or infarct. Ventricular size is normal. Cerebellum unremarkable. Vascular: No asymmetric  hyperdense vasculature at the skull base. Skull: Intact Sinuses/Orbits: Paranasal sinuses are clear. Orbits are unremarkable. Other: Mastoid air cells and middle ear cavities are clear. IMPRESSION: No acute intracranial hemorrhage or infarct.  Mild senescent change. Electronically Signed   By: Fidela Salisbury MD   On: 06/19/2021 22:56   CT Hip Left Wo Contrast  Result Date: 06/19/2021 CLINICAL DATA:  Fall EXAM: CT OF THE LEFT HIP WITHOUT CONTRAST TECHNIQUE: Multidetector CT imaging of the left hip was performed according to the standard protocol. Multiplanar CT image reconstructions were also generated. COMPARISON:  Plain films earlier today FINDINGS: Bones/Joint/Cartilage Left femoral intertrochanteric fracture noted. The fracture is mildly comminuted and impacted. No subluxation or dislocation. Fracture is acute. Ligaments Suboptimally assessed by CT. Muscles and Tendons Unremarkable Soft tissues Unremarkable. IMPRESSION: Comminuted, impacted acute left femoral intertrochanteric fracture. Electronically Signed   By: Rolm Baptise M.D.   On: 06/19/2021 23:55   DG Hip Unilat With Pelvis 2-3 Views Left  Result Date: 06/19/2021 CLINICAL DATA:  Fall, left hip pain EXAM: DG HIP (WITH OR WITHOUT PELVIS) 2-3V LEFT COMPARISON:  10/04/2017 FINDINGS: There is an age-indeterminate impacted basicervical left hip fracture. There is suggestion of trabecula bridging the fracture plane on lateral examination suggesting that this may be healed, however, this is not optimally assessed on this  examination. There is no dislocation. No other fracture identified. Mild left hip degenerative arthritis is noted. IMPRESSION: Age indeterminate basicervical fracture of the left hip. Dedicated CT imaging is recommended for further evaluation. Electronically Signed   By: Fidela Salisbury MD   On: 06/19/2021 22:51    EKG: Independently reviewed. Sinus rhythm, QTc 540 ms.   Assessment/Plan   1. Left hip fracture  - Presents with left hip pain after a mechanical fall and is found to have left intertrochanteric femur fracture  - Orthopedic surgery consulting and much appreciated  - Based on the available data, Mrs. Studzinski presents an estimated 1% risk of perioperative MI or cardiac arrest  - Hold ASA and Plavix, continue NPO and pain-control    2. Prolonged QT interval  - QTc is 540 ms in setting of hypokalemia  - Continue cardiac monitoring, replace potassium, check magnesium, minimize QT-prolonging medications, repeat EKG in am    3. Hypokalemia  - Serum potassium 2.8 on admission  - Replace potassium, repeat chem panel with mag level in am   4. CAD  - No recent angina, had stent in 2013  - Hold antiplatelets, continue statin and beta-blocker    5. Depression, anxiety  - Continue SNRI and as-needed Xanax  6. Hypertension  - BP at goal  - Hold HCTZ, continue Norvasc and metoprolol as tolerated     DVT prophylaxis: SCDs  Code Status: Full  Level of Care: Level of care: Telemetry Medical Family Communication: None present  Disposition Plan:  Patient is from: Home  Anticipated d/c is to: TBD Anticipated d/c date is: 06/23/21 Patient currently: Pending orthopedic surgery consultation, likely operative repair  Consults called: Orthopedic surgery  Admission status: Inpatient     Vianne Bulls, MD [facility]  06/20/2021, 2:29 AM

## 2021-06-20 NOTE — Anesthesia Procedure Notes (Signed)
Procedure Name: LMA Insertion Date/Time: 06/20/2021 2:36 PM Performed by: Reeves Dam, CRNA Pre-anesthesia Checklist: Patient identified, Patient being monitored, Timeout performed, Emergency Drugs available and Suction available Patient Re-evaluated:Patient Re-evaluated prior to induction Oxygen Delivery Method: Circle system utilized Preoxygenation: Pre-oxygenation with 100% oxygen Induction Type: IV induction Ventilation: Mask ventilation without difficulty LMA: LMA inserted LMA Size: 4.0 Tube type: Oral Number of attempts: 1 Placement Confirmation: positive ETCO2 and breath sounds checked- equal and bilateral Tube secured with: Tape Dental Injury: Teeth and Oropharynx as per pre-operative assessment

## 2021-06-20 NOTE — TOC CAGE-AID Note (Signed)
Transition of Care [facility]) - CAGE-AID Screening   Patient Details  Name: Carrie Chung MRN: [id number] Date of Birth: 07/16/1941  Transition of [facility]) CM/SW Contact:    Clovis Cao, RN Phone Number: 06/20/2021, 1:07 PM   Clinical Narrative: Pt states she does drink alcohol but does not do any drugs and does not want resources at this time.   CAGE-AID Screening:    Have You Ever Felt You Ought to Cut Down on Your Drinking or Drug Use?: No Have People Annoyed You By Critizing Your Drinking Or Drug Use?: No Have You Felt Bad Or Guilty About Your Drinking Or Drug Use?: No Have You Ever Had a Drink or Used Drugs First Thing In The Morning to Steady Your Nerves or to Get Rid of a Hangover?: No CAGE-AID Score: 0  Substance Abuse Education Offered: Yes

## 2021-06-20 NOTE — ED Notes (Signed)
Pharmacy messaged for prn robaxin

## 2021-06-20 NOTE — Progress Notes (Signed)
Pharmacy note:  Per fracture care post-op consult, Plavix to resume POD #1.  Last Plavix dose 06/17/21 per med history.  Was also on Aspirin 81 mg daily PTA, last dose 06/19/21.  Plan:  Plavix 75 mg PO daily to resume on 7/2.  Will follow up for resuming Aspirin 81 mg daily.   Arty Baumgartner, [place] 06/20/2021 7:14 PM

## 2021-06-21 LAB — CBC
HCT: 28.4 % — ABNORMAL LOW (ref 36.0–46.0)
Hemoglobin: 9.6 g/dL — ABNORMAL LOW (ref 12.0–15.0)
MCH: 32.8 pg (ref 26.0–34.0)
MCHC: 33.8 g/dL (ref 30.0–36.0)
MCV: 96.9 fL (ref 80.0–100.0)
Platelets: 130 10*3/uL — ABNORMAL LOW (ref 150–400)
RBC: 2.93 MIL/uL — ABNORMAL LOW (ref 3.87–5.11)
RDW: 13.2 % (ref 11.5–15.5)
WBC: 6.9 10*3/uL (ref 4.0–10.5)
nRBC: 0 % (ref 0.0–0.2)

## 2021-06-21 LAB — BASIC METABOLIC PANEL
Anion gap: 8 (ref 5–15)
BUN: 6 mg/dL — ABNORMAL LOW (ref 8–23)
CO2: 31 mmol/L (ref 22–32)
Calcium: 8.6 mg/dL — ABNORMAL LOW (ref 8.9–10.3)
Chloride: 96 mmol/L — ABNORMAL LOW (ref 98–111)
Creatinine, Ser: 0.66 mg/dL (ref 0.44–1.00)
GFR, Estimated: >60 mL/min (ref >60)
Glucose, Bld: 193 mg/dL — ABNORMAL HIGH (ref 70–99)
Potassium: 3.9 mmol/L (ref 3.5–5.1)
Sodium: 135 mmol/L (ref 135–145)

## 2021-06-21 MED ORDER — ASPIRIN EC 81 MG PO TBEC
81.0000 mg | DELAYED_RELEASE_TABLET | Freq: Every day | ORAL | Status: DC
  Administered 2021-06-21 – 2021-06-24 (×4): 81 mg via ORAL
  Filled 2021-06-21 (×4): qty 1

## 2021-06-21 NOTE — Progress Notes (Addendum)
PROGRESS NOTE    Carrie Chung  ZWC:[id number] DOB: 1941-06-18 DOA: 06/19/2021 PCP: [name], MD   Brief Narrative:  Carrie Chung is a 80 y.o. female with medical history significant for coronary artery disease, atrial tachycardia, hypertension, nocturnal oxygen requirement, depression, and anxiety, now presenting to the emergency department for evaluation of left hip pain after a fall.  CT of the left hip demonstrates comminuted impacted acute left femoral intertrochanteric fracture.  Orthopedic surgery was consulted by the ED physician and hospitalists asked to admit.  Assessment & Plan:   Principal Problem:   Closed left hip fracture, initial encounter ([place]) Active Problems:   HYPERTENSION, BENIGN   Prolonged QT interval   Coronary Artery Disease   Depression with anxiety   Hypoxemic respiratory failure, chronic (HCC)   Hypokalemia   Left hip fracture status post mechanical fall -Orthopedics following, appreciate insight and recommendation -ORIF per surgery schedule - Hold ASA and Plavix, continue NPO and pain-control  -Difficulty with PT today in setting of pain, we discussed she has multiple medications available if necessary, she had a bad experience with Dilaudid earlier this hospitalization with intractable nausea and headache -while not an allergy will avoid this medication in the future   Prolonged QT interval  - QTc is 540 ms in setting of hypokalemia  - Continue cardiac monitoring, replace potassium, check magnesium, minimize QT-prolonging medications, repeat EKG shows sinus rhythm   Hypokalemia  - Serum potassium 2.8 on admission; repleted appropriately, f follow repeat labs   Obstructive CAD history status post stent 2013 - No recent issues or events from a cardiac standpoint -Resume aspirin, Plavix, metoprolol  Depression, anxiety  - Continue SNRI and as-needed Xanax   Hypertension  - Continue metoprolol - Borderline hypotension in the  setting of narcotics, follow closely while holding amlodipine and HCTZ   DVT prophylaxis: SCDs  Code Status: Full   Status is: Inpatient  Dispo: The patient is from: Home              Anticipated d/c is to: To be determined              Anticipated d/c date is: 48 to 72 hours              Patient currently not medically stable for discharge  Consultants:  Orthopedic surgery  Procedures:  ORIF left hip per orthopedic surgery schedule  Antimicrobials:  None indicated  Subjective: No acute issues or events overnight, pain somewhat poorly controlled today with therapy, well controlled at rest  Objective: Vitals:   06/20/21 1552 06/20/21 1615 06/20/21 2100 06/21/21 0749  BP: 112/68 109/63 (!) 112/48 (!) 106/52  Pulse: 79 77 86 72  Resp: 16 18 17 18   Temp: 97.6 F (36.4 C) 98.1 F (36.7 C) 98.1 F (36.7 C) 98.6 F (37 C)  TempSrc:  Oral Oral Oral  SpO2: 97% 96% 98% 98%  Weight:      Height:        Intake/Output Summary (Last 24 hours) at 06/21/2021 0808 Last data filed at 06/21/2021 2353 Gross per 24 hour  Intake 400 ml  Output 1775 ml  Net -1375 ml   Filed Weights   06/19/21 2130  Weight: 68 kg    Examination:  General:  Pleasantly resting in bed, No acute distress. HEENT:  Normocephalic atraumatic.  Sclerae nonicteric, noninjected.  Extraocular movements intact bilaterally. Neck:  Without mass or deformity.  Trachea is midline. Lungs:  Clear to auscultate bilaterally  without rhonchi, wheeze, or rales. Heart:  Regular rate and rhythm.  Without murmurs, rubs, or gallops. Abdomen:  Soft, nontender, nondistended.  Without guarding or rebound. Extremities: Without cyanosis, clubbing, edema, or obvious deformity. Vascular:  Dorsalis pedis and posterior tibial pulses palpable bilaterally. Skin:  Warm and dry, no erythema, no ulcerations.  Postop bandage clean dry intact.  Data Reviewed: I have personally reviewed following labs and imaging studies  CBC: Recent  Labs  Lab 06/19/21 2210 06/20/21 0328 06/21/21 0153  WBC 6.7 7.5 6.9  NEUTROABS 5.0  --   --   HGB 13.2 12.7 9.6*  HCT 38.6 36.7 28.4*  MCV 96.0 96.3 96.9  PLT 146* 128* 130*    Basic Metabolic Panel: Recent Labs  Lab 06/19/21 2210 06/20/21 0328 06/21/21 0153  NA 136 135 135  K 2.8* 3.4* 3.9  CL 96* 95* 96*  CO2 27 31 31   GLUCOSE 122* 150* 193*  BUN 5* 6* 6*  CREATININE 0.62 0.66 0.66  CALCIUM 8.6* 8.6* 8.6*  MG  --  2.3  --     GFR: Estimated Creatinine Clearance: 49.5 mL/min (by C-G formula based on SCr of 0.66 mg/dL). Liver Function Tests: No results for input(s): AST, ALT, ALKPHOS, BILITOT, PROT, ALBUMIN in the last 168 hours. No results for input(s): LIPASE, AMYLASE in the last 168 hours. No results for input(s): AMMONIA in the last 168 hours. Coagulation Profile: Recent Labs  Lab 06/19/21 2210  INR 1.0    Cardiac Enzymes: No results for input(s): CKTOTAL, CKMB, CKMBINDEX, TROPONINI in the last 168 hours. BNP (last 3 results) No results for input(s): PROBNP in the last 8760 hours. HbA1C: No results for input(s): HGBA1C in the last 72 hours. CBG: No results for input(s): GLUCAP in the last 168 hours. Lipid Profile: No results for input(s): CHOL, HDL, LDLCALC, TRIG, CHOLHDL, LDLDIRECT in the last 72 hours. Thyroid Function Tests: No results for input(s): TSH, T4TOTAL, FREET4, T3FREE, THYROIDAB in the last 72 hours. Anemia Panel: No results for input(s): VITAMINB12, FOLATE, FERRITIN, TIBC, IRON, RETICCTPCT in the last 72 hours. Sepsis Labs: No results for input(s): PROCALCITON, LATICACIDVEN in the last 168 hours.  Recent Results (from the past 240 hour(s))  Resp Panel by RT-PCR (Flu A&B, Covid) Nasopharyngeal Swab     Status: None   Collection Time: 06/20/21 12:40 AM   Specimen: Nasopharyngeal Swab; Nasopharyngeal(NP) swabs in vial transport medium  Result Value Ref Range Status   SARS Coronavirus 2 by RT PCR NEGATIVE NEGATIVE Final    Comment:  (NOTE) SARS-CoV-2 target nucleic acids are NOT DETECTED.  The SARS-CoV-2 RNA is generally detectable in upper respiratory specimens during the acute phase of infection. The lowest concentration of SARS-CoV-2 viral copies this assay can detect is 138 copies/mL. A negative result does not preclude SARS-Cov-2 infection and should not be used as the sole basis for treatment or other patient management decisions. A negative result may occur with  improper specimen collection/handling, submission of specimen other than nasopharyngeal swab, presence of viral mutation(s) within the areas targeted by this assay, and inadequate number of viral copies(<138 copies/mL). A negative result must be combined with clinical observations, patient history, and epidemiological information. The expected result is Negative.  Fact Sheet for Patients:  EntrepreneurPulse.com.au  Fact Sheet for Healthcare Providers:  IncredibleEmployment.be  This test is no t yet approved or cleared by the [place] FDA and  has been authorized for detection and/or diagnosis of SARS-CoV-2 by FDA under an Emergency Use Authorization (EUA).  This EUA will remain  in effect (meaning this test can be used) for the duration of the COVID-19 declaration under Section 564(b)(1) of the Act, 21 U.S.C.section 360bbb-3(b)(1), unless the authorization is terminated  or revoked sooner.       Influenza A by PCR NEGATIVE NEGATIVE Final   Influenza B by PCR NEGATIVE NEGATIVE Final    Comment: (NOTE) The Xpert Xpress SARS-CoV-2/FLU/RSV plus assay is intended as an aid in the diagnosis of influenza from Nasopharyngeal swab specimens and should not be used as a sole basis for treatment. Nasal washings and aspirates are unacceptable for Xpert Xpress SARS-CoV-2/FLU/RSV testing.  Fact Sheet for Patients: EntrepreneurPulse.com.au  Fact Sheet for Healthcare  Providers: IncredibleEmployment.be  This test is not yet approved or cleared by the [place] FDA and has been authorized for detection and/or diagnosis of SARS-CoV-2 by FDA under an Emergency Use Authorization (EUA). This EUA will remain in effect (meaning this test can be used) for the duration of the COVID-19 declaration under Section 564(b)(1) of the Act, 21 U.S.C. section 360bbb-3(b)(1), unless the authorization is terminated or revoked.  Performed at [facility], [place] [address], [place], [place] [address]   Surgical pcr screen     Status: None   Collection Time: 06/20/21  1:29 PM   Specimen: Nasal Mucosa; Nasal Swab  Result Value Ref Range Status   MRSA, PCR NEGATIVE NEGATIVE Final   Staphylococcus aureus NEGATIVE NEGATIVE Final    Comment: (NOTE) The Xpert SA Assay (FDA approved for NASAL specimens in patients 32 years of age and older), is one component of a comprehensive surveillance program. It is not intended to diagnose infection nor to guide or monitor treatment. Performed at [facility], [place] [address], [place], [place] [address]           Radiology Studies: DG Chest 1 View  Result Date: 06/19/2021 CLINICAL DATA:  Fall EXAM: CHEST  1 VIEW COMPARISON:  11/21/2020 FINDINGS: The heart size and mediastinal contours are within normal limits. Both lungs are clear. The visualized skeletal structures are unremarkable. IMPRESSION: No active disease. Electronically Signed   By: Fidela Salisbury MD   On: 06/19/2021 22:48   DG Wrist Complete Left  Result Date: 06/19/2021 CLINICAL DATA:  Fall, left wrist pain EXAM: LEFT WRIST - COMPLETE 3+ VIEW COMPARISON:  None. FINDINGS: Three view radiograph left wrist demonstrates normal alignment. No fracture or dislocation. Joint spaces are preserved. There is moderate soft tissue swelling dorsal to the a distal radius and ulna. IMPRESSION: Soft tissue swelling.  No fracture or dislocation.  Electronically Signed   By: Fidela Salisbury MD   On: 06/19/2021 22:52   CT HEAD WO CONTRAST  Result Date: 06/19/2021 CLINICAL DATA:  Fall, head injury EXAM: CT HEAD WITHOUT CONTRAST TECHNIQUE: Contiguous axial images were obtained from the base of the skull through the vertex without intravenous contrast. COMPARISON:  MRI 01/14/2018 FINDINGS: Brain: Normal anatomic configuration. Parenchymal volume loss is commensurate with the patient's age. Mild periventricular white matter changes are present likely reflecting the sequela of small vessel ischemia. No abnormal intra or extra-axial mass lesion or fluid collection. No abnormal mass effect or midline shift. No evidence of acute intracranial hemorrhage or infarct. Ventricular size is normal. Cerebellum unremarkable. Vascular: No asymmetric hyperdense vasculature at the skull base. Skull: Intact Sinuses/Orbits: Paranasal sinuses are clear. Orbits are unremarkable. Other: Mastoid air cells and middle ear cavities are clear. IMPRESSION: No acute intracranial hemorrhage or infarct.  Mild senescent  change. Electronically Signed   By: Fidela Salisbury MD   On: 06/19/2021 22:56   CT Hip Left Wo Contrast  Result Date: 06/19/2021 CLINICAL DATA:  Fall EXAM: CT OF THE LEFT HIP WITHOUT CONTRAST TECHNIQUE: Multidetector CT imaging of the left hip was performed according to the standard protocol. Multiplanar CT image reconstructions were also generated. COMPARISON:  Plain films earlier today FINDINGS: Bones/Joint/Cartilage Left femoral intertrochanteric fracture noted. The fracture is mildly comminuted and impacted. No subluxation or dislocation. Fracture is acute. Ligaments Suboptimally assessed by CT. Muscles and Tendons Unremarkable Soft tissues Unremarkable. IMPRESSION: Comminuted, impacted acute left femoral intertrochanteric fracture. Electronically Signed   By: Rolm Baptise M.D.   On: 06/19/2021 23:55   DG C-Arm 1-60 Min  Result Date: 06/20/2021 CLINICAL DATA:   Intramedullary nail. EXAM: LEFT FEMUR 2 VIEWS; DG C-ARM 1-60 MIN COMPARISON:  Hip radiograph and CT yesterday FINDINGS: Five fluoroscopic spot views of the left femur obtained in the operating room. Intramedullary nail with trans trochanteric screw fixation of intertrochanteric femur fracture fluoroscopy time 38 seconds. Dose 7.67 mGy. IMPRESSION: Fluoroscopic spot views during ORIF left femur fracture. Electronically Signed   By: Keith Rake M.D.   On: 06/20/2021 15:24   DG Hip Unilat With Pelvis 2-3 Views Left  Result Date: 06/19/2021 CLINICAL DATA:  Fall, left hip pain EXAM: DG HIP (WITH OR WITHOUT PELVIS) 2-3V LEFT COMPARISON:  10/04/2017 FINDINGS: There is an age-indeterminate impacted basicervical left hip fracture. There is suggestion of trabecula bridging the fracture plane on lateral examination suggesting that this may be healed, however, this is not optimally assessed on this examination. There is no dislocation. No other fracture identified. Mild left hip degenerative arthritis is noted. IMPRESSION: Age indeterminate basicervical fracture of the left hip. Dedicated CT imaging is recommended for further evaluation. Electronically Signed   By: Fidela Salisbury MD   On: 06/19/2021 22:51   DG FEMUR MIN 2 VIEWS LEFT  Result Date: 06/20/2021 CLINICAL DATA:  Intramedullary nail. EXAM: LEFT FEMUR 2 VIEWS; DG C-ARM 1-60 MIN COMPARISON:  Hip radiograph and CT yesterday FINDINGS: Five fluoroscopic spot views of the left femur obtained in the operating room. Intramedullary nail with trans trochanteric screw fixation of intertrochanteric femur fracture fluoroscopy time 38 seconds. Dose 7.67 mGy. IMPRESSION: Fluoroscopic spot views during ORIF left femur fracture. Electronically Signed   By: Keith Rake M.D.   On: 06/20/2021 15:24     Scheduled Meds:  acetaminophen  500 mg Oral Q6H   amLODipine  5 mg Oral Daily   Chlorhexidine Gluconate Cloth  6 each Topical Daily   clopidogrel  75 mg Oral Daily    docusate sodium  100 mg Oral BID   feeding supplement  237 mL Oral BID BM   fentaNYL (SUBLIMAZE) injection  100 mcg Intravenous Once   metoprolol tartrate  75 mg Oral BID   multivitamin with minerals  1 tablet Oral Daily   pantoprazole  40 mg Oral Daily   rosuvastatin  20 mg Oral QHS   venlafaxine XR  75 mg Oral Q breakfast   Continuous Infusions:  methocarbamol (ROBAXIN) IV Stopped (06/20/21 1051)     LOS: 1 day   Time spent: 38min  Alessia Gonsalez C Kisa Fujii, DO [facility]  If 7PM-7AM, please contact night-coverage [email or web address]  06/21/2021, 8:08 AM

## 2021-06-21 NOTE — Progress Notes (Signed)
SPORTS MEDICINE AND JOINT REPLACEMENT  Lara Mulch, MD    Carlyon Shadow, PA-C [place], [place], [place]  [address]                             [phone]   PROGRESS NOTE  Subjective:  negative for Chest Pain  negative for Shortness of Breath  negative for Nausea/Vomiting   negative for Calf Pain  negative for Bowel Movement   Tolerating Diet: yes         Patient reports pain as 5 on 0-10 scale.    Objective: Vital signs in last 24 hours:   Patient Vitals for the past 24 hrs:  BP Temp Temp src Pulse Resp SpO2  06/20/21 2100 (!) 112/48 98.1 F (36.7 C) Oral 86 17 98 %  06/20/21 1615 109/63 98.1 F (36.7 C) Oral 77 18 96 %  06/20/21 1552 112/68 97.6 F (36.4 C) -- 79 16 97 %  06/20/21 1537 93/64 -- -- -- 13 95 %  06/20/21 1529 -- -- -- -- -- 94 %  06/20/21 1522 (!) 142/78 97.7 F (36.5 C) -- 88 17 --  06/20/21 1320 (!) 164/82 -- -- -- -- --  06/20/21 1256 (!) 187/81 99.3 F (37.4 C) Oral 99 18 97 %  06/20/21 1215 (!) 150/69 98.8 F (37.1 C) Oral 100 17 98 %  06/20/21 1145 (!) 148/73 -- -- (!) 101 (!) 21 96 %  06/20/21 1116 -- -- -- (!) 101 -- --  06/20/21 1115 109/69 -- -- (!) 101 13 98 %  06/20/21 1030 139/66 -- -- 100 14 98 %  06/20/21 1015 (!) 142/70 -- -- 98 18 96 %  06/20/21 1000 (!) 145/70 -- -- 99 (!) 22 97 %  06/20/21 0945 139/69 -- -- (!) 104 15 97 %  06/20/21 0930 126/75 -- -- 90 (!) 23 97 %  06/20/21 0915 136/60 -- -- 91 (!) 21 97 %  06/20/21 0900 134/65 -- -- (!) 101 15 97 %  06/20/21 0845 (!) 128/47 -- -- 89 13 98 %  06/20/21 0830 -- -- -- 89 19 98 %  06/20/21 0815 (!) 128/59 -- -- 98 15 97 %  06/20/21 0800 (!) 147/75 -- -- (!) 101 20 99 %  06/20/21 0745 (!) 134/58 -- -- 97 20 98 %  06/20/21 0730 (!) 137/56 -- -- 96 18 99 %    @flow {1959:LAST@   Intake/Output from previous day:   07/01 0701 - 07/02 0700 In: 400 [I.V.:400] Out: 1775 [Urine:1525]   Intake/Output this shift:   No intake/output data recorded.   Intake/Output       07/01 0701 07/02 0700 07/02 0701 07/03 0700   I.V. (mL/kg) 400 (5.9)    Total Intake(mL/kg) 400 (5.9)    Urine (mL/kg/hr) 1525 (0.9)    Blood 250    Total Output 1775    Net -1375            LABORATORY DATA: Recent Labs    06/19/21 2210 06/20/21 0328 06/21/21 0153  WBC 6.7 7.5 6.9  HGB 13.2 12.7 9.6*  HCT 38.6 36.7 28.4*  PLT 146* 128* 130*   Recent Labs    06/19/21 2210 06/20/21 0328 06/21/21 0153  NA 136 135 135  K 2.8* 3.4* 3.9  CL 96* 95* 96*  CO2 27 31 31   BUN 5* 6* 6*  CREATININE 0.62 0.66 0.66  GLUCOSE 122* 150* 193*  CALCIUM 8.6* 8.6* 8.6*   Lab Results  Component Value Date   INR 1.0 06/19/2021   INR 1.09 05/01/2014   INR 1.01 04/30/2014    Examination:  General appearance: alert, cooperative, and no distress Extremities: extremities normal, atraumatic, no cyanosis or edema  Wound Exam: clean, dry, intact   Drainage:  None: wound tissue dry  Motor Exam: Quadriceps and Hamstrings Intact  Sensory Exam: Superficial Peroneal, Deep Peroneal, and Tibial normal   Assessment:    1 Day Post-Op  Procedure(s) (LRB): INTRAMEDULLARY (IM) NAIL INTERTROCHANTRIC (Left)  ADDITIONAL DIAGNOSIS:  Principal Problem:   Closed left hip fracture, initial encounter (HCC) Active Problems:   HYPERTENSION, BENIGN   Prolonged QT interval   Coronary Artery Disease   Depression with anxiety   Hypoxemic respiratory failure, chronic (HCC)   Hypokalemia     Plan: Physical Therapy as ordered Weight Bearing as Tolerated (WBAT)  DVT Prophylaxis:   Plavix, SCDs  Patient adamant not to go to SNF. PT/OT eval. Ortho will continue to follow  Donia Ast 06/21/2021, 7:27 AM

## 2021-06-21 NOTE — Plan of Care (Signed)

## 2021-06-21 NOTE — Evaluation (Signed)
Physical Therapy Evaluation Patient Details Name: Carrie Chung MRN: [id number] DOB: 12/15/1941 Today's Date: 06/21/2021   History of Present Illness  The pt is an 80 yo female presenting 6/30 after a mechanical fall at home with c/o L hip pain. Imaging revealed acute left femoral intertrochanteric fracture, now s/p IM nail on 7/1. PMH includes: CAD, atrial tachycardia, NSTEMI, HTN, HLD, and multiple stent placements.   Clinical Impression  Pt in bed upon arrival of PT, agreeable to evaluation at this time. Prior to admission the pt was completely independent without need for AD, completely independent with management of home and all medical care for herself and husband. The pt now presents with limitations in functional mobility, strength, ROM, balance, and power due to above dx and resulting pain, and will continue to benefit from skilled PT to address these deficits. The pt was able to tolerate transition to sitting EOB, but needed mod/maxA to complete movements and initially to manage balance at EOB. The pt requires consistent support to LLE due to pain with any knee flexion, and was unable to generate power through RLE to stand. The pt would require assist of 2 (not available at eval) to complete OOB transfer, and will need max rehab to allow for pt to return to full independence at home. The pt is highly motivated and will continue to benefit from skilled PT acutely and following d/c to allow for greater independence and safety with OOB mobility.      Follow Up Recommendations CIR    Equipment Recommendations   (defer to post acute, at current mobility would need WC, drop arm 3-in1)    Recommendations for Other Services Rehab consult     Precautions / Restrictions Precautions Precautions: Fall Restrictions Weight Bearing Restrictions: Yes LLE Weight Bearing: Weight bearing as tolerated      Mobility  Bed Mobility Overal bed mobility: Needs Assistance Bed Mobility: Supine to  Sit;Sit to Supine     Supine to sit: Max assist;HOB elevated Sit to supine: Max assist;HOB elevated   General bed mobility comments: pt able to assist with UE on bed rail, maxA to move BLE to EOB due to pain. unable to tolerate any bending of L knee, needed consistent support. leaning to R due to pain    Transfers Overall transfer level: Needs assistance Equipment used: None Transfers: Lateral/Scoot Transfers          Lateral/Scoot Transfers: Mod assist General transfer comment: modA with use of bed pad to facilitate scooting along EOB. full support under LLE, pt able to maintain balance  Ambulation/Gait             General Gait Details: deferred due to pain     Balance Overall balance assessment: Mild deficits observed, not formally tested                                           Pertinent Vitals/Pain      Home Living Family/patient expects to be discharged to:: Private residence Living Arrangements: Spouse/significant other Available Help at Discharge: Family;Available 24 hours/day Type of Home: House Home Access: Stairs to enter Entrance Stairs-Rails: None Entrance Stairs-Number of Steps: 3 Home Layout: Multi-level;Bed/bath upstairs (husband occupying downstairs room) Home Equipment: Crutches;Cane - single point;Hand held shower head      Prior Function Level of Independence: Independent         Comments: pt  completely independent prior, managing home, taking care of husband who has balance issues and beginning stages of dementia     Hand Dominance   Dominant Hand: Right    Extremity/Trunk Assessment   Upper Extremity Assessment Upper Extremity Assessment: Overall WFL for tasks assessed    Lower Extremity Assessment Lower Extremity Assessment: LLE deficits/detail LLE Deficits / Details: reports no numbness or tingling, able to move at ankle and toes. limited knee and hip due to pain LLE: Unable to fully assess due to  pain LLE Sensation: WNL    Cervical / Trunk Assessment Cervical / Trunk Assessment: Normal  Communication   Communication: No difficulties  Cognition Arousal/Alertness: Awake/alert Behavior During Therapy: WFL for tasks assessed/performed Overall Cognitive Status: Within Functional Limits for tasks assessed                                        General Comments General comments (skin integrity, edema, etc.): VSS on RA    Exercises     Assessment/Plan    PT Assessment Patient needs continued PT services  PT Problem List Decreased strength;Decreased range of motion;Decreased activity tolerance;Decreased balance;Decreased mobility;Pain       PT Treatment Interventions DME instruction;Gait training;Stair training;Functional mobility training;Therapeutic activities;Balance training;Therapeutic exercise;Patient/family education    PT Goals (Current goals can be found in the Care Plan section)  Acute Rehab PT Goals Patient Stated Goal: return home, no SNF PT Goal Formulation: With patient Time For Goal Achievement: 07/05/21 Potential to Achieve Goals: Good    Frequency Min 4X/week   Barriers to discharge Decreased caregiver support;Inaccessible home environment pt spouse unable to physically assist, otherwise assist only intermittent       AM-PAC PT "6 Clicks" Mobility  Outcome Measure Help needed turning from your back to your side while in a flat bed without using bedrails?: A Lot Help needed moving from lying on your back to sitting on the side of a flat bed without using bedrails?: A Lot Help needed moving to and from a bed to a chair (including a wheelchair)?: Total Help needed standing up from a chair using your arms (e.g., wheelchair or bedside chair)?: Total Help needed to walk in hospital room?: Total Help needed climbing 3-5 steps with a railing? : Total 6 Click Score: 8    End of Session Equipment Utilized During Treatment: Gait  belt Activity Tolerance: Patient limited by pain Patient left: in bed;with call bell/phone within reach;with bed alarm set;with family/visitor present Nurse Communication: Mobility status;Need for lift equipment PT Visit Diagnosis: Other abnormalities of gait and mobility (R26.89);Muscle weakness (generalized) (M62.81);Pain Pain - Right/Left: Left Pain - part of body: Leg    Time: 9628-3662 PT Time Calculation (min) (ACUTE ONLY): 50 min   Charges:   PT Evaluation $PT Eval Low Complexity: 1 Low PT Treatments $Therapeutic Activity: 23-37 mins        Karma Ganja, PT, DPT   Acute Rehabilitation Department Pager #: [phone]  Otho Bellows 06/21/2021, 11:03 AM

## 2021-06-21 NOTE — Progress Notes (Signed)
Inpatient Rehab Admissions:  Inpatient Rehab Consult received.  I met with patient at the bedside for rehabilitation assessment and to discuss goals and expectations of an inpatient rehab admission.  Pt acknowledged understanding of CIR goals and expectations. She is interested in pursuing CIR. Pt gave permission to contact daughter, Reeves Forth.  Requested I reach out to her tomorrow d/t her not being available today.  Will continue to follow.  Signed: Gayland Curry, [place], [place] Admissions Coordinator [phone]

## 2021-06-22 LAB — BASIC METABOLIC PANEL
Anion gap: 7 (ref 5–15)
BUN: 9 mg/dL (ref 8–23)
CO2: 32 mmol/L (ref 22–32)
Calcium: 8.4 mg/dL — ABNORMAL LOW (ref 8.9–10.3)
Chloride: 98 mmol/L (ref 98–111)
Creatinine, Ser: 0.66 mg/dL (ref 0.44–1.00)
GFR, Estimated: >60 mL/min (ref >60)
Glucose, Bld: 138 mg/dL — ABNORMAL HIGH (ref 70–99)
Potassium: 3.7 mmol/L (ref 3.5–5.1)
Sodium: 137 mmol/L (ref 135–145)

## 2021-06-22 LAB — CBC
HCT: 24.1 % — ABNORMAL LOW (ref 36.0–46.0)
Hemoglobin: 8.2 g/dL — ABNORMAL LOW (ref 12.0–15.0)
MCH: 32.8 pg (ref 26.0–34.0)
MCHC: 34 g/dL (ref 30.0–36.0)
MCV: 96.4 fL (ref 80.0–100.0)
Platelets: 140 10*3/uL — ABNORMAL LOW (ref 150–400)
RBC: 2.5 MIL/uL — ABNORMAL LOW (ref 3.87–5.11)
RDW: 13.3 % (ref 11.5–15.5)
WBC: 7.1 10*3/uL (ref 4.0–10.5)
nRBC: 0 % (ref 0.0–0.2)

## 2021-06-22 MED ORDER — METOPROLOL TARTRATE 25 MG PO TABS
25.0000 mg | ORAL_TABLET | Freq: Two times a day (BID) | ORAL | Status: DC
  Administered 2021-06-22 – 2021-06-24 (×4): 25 mg via ORAL
  Filled 2021-06-22 (×4): qty 1

## 2021-06-22 MED ORDER — ACETAMINOPHEN 325 MG PO TABS
650.0000 mg | ORAL_TABLET | Freq: Three times a day (TID) | ORAL | Status: DC
  Administered 2021-06-22 – 2021-06-23 (×2): 650 mg via ORAL
  Filled 2021-06-22 (×2): qty 2

## 2021-06-22 NOTE — Progress Notes (Signed)
Inpatient Rehab Admissions Coordinator:  Spoke with pt's daughter, Reeves Forth on the telephone. She acknowledged understanding of CIR goals and expectations. She is interested in pt pursuing CIR.  She was able to confirm appropriate disposition for pt to be considered for potential CIR admission. Will continue to follow.   Gayland Curry, [place], [place] Admissions Coordinator [phone]

## 2021-06-22 NOTE — Progress Notes (Signed)
Occupational Therapy Evaluation Patient Details Name: Carrie Chung MRN: [id number] DOB: 09-May-1941 Today's Date: 06/22/2021    History of Present Illness Pt is an 80 y/o female presenting on 6/30 after a mechanical fall at home with c/o L hip pain. Imaging revealed acute left femoral intertrochanteric fracture, now s/p IM nail on 7/1. PMH includes: CAD, atrial tachycardia, NSTEMI, HTN, HLD, and multiple stent placements.   Clinical Impression   Carrie Chung was evaluated s/p the above fall and L hip IM nail placement. PTA pt was indep in all ADL/IADLs and reports she "has had a lot of practice falling since 1998." When asked what leads to her falls she reported it is because she is "clumsy and careless." Upon evaluation pt was mod A +1 for bed mobility and mod A +2 for sit<>stand and stand-pivot x2 with rw. Pt required gentle encouragement for all activity due to pain exacerbation. Upper ADLs are at a set up level and lower body ADLs are max A +2. Pt benefits from OT acutely to progress ADLs and mobility. Recommend intensive therapy at CIR level to maximize independence prior to dc home.     Follow Up Recommendations  CIR;Supervision/Assistance - 24 hour    Equipment Recommendations   (defer to next venue of fcare)       Precautions / Restrictions Precautions Precautions: Fall Restrictions Weight Bearing Restrictions: Yes LLE Weight Bearing: Weight bearing as tolerated      Mobility Bed Mobility Overal bed mobility: Needs Assistance Bed Mobility: Supine to Sit     Supine to sit: HOB elevated;Mod assist     General bed mobility comments: increased time and effort needed, use of bed rails, assistance needed for L LE movement off of bed and use of bed pads to scoot hips forwards towards EOB    Transfers Overall transfer level: Needs assistance Equipment used: Rolling walker (2 wheeled) Transfers: Sit to/from [facility] Sit to Stand: Mod assist;+2 physical  assistance Stand pivot transfers: Mod assist;+2 physical assistance       General transfer comment: verbal and tactile cueing needed for safe hand placement and technique with RW, mod A x2 needed to power into a full upright standing position and to pivot to [facility] towards her R side. She then performed a second sit<>stand from [facility] and pivoted to the recliner chair on her R with mod A x2    Balance Overall balance assessment: Needs assistance Sitting-balance support: Feet supported Sitting balance-Leahy Scale: Fair     Standing balance support: During functional activity;Bilateral upper extremity supported Standing balance-Leahy Scale: Poor                             ADL either performed or assessed with clinical judgement   ADL Overall ADL's : Needs assistance/impaired Eating/Feeding: Independent;Sitting   Grooming: Wash/dry hands;Wash/dry face;Applying deodorant;Oral care;Set up;Sitting   Upper Body Bathing: Set up;Sitting   Lower Body Bathing: Maximal assistance;+2 for physical assistance;Sit to/from stand   Upper Body Dressing : Set up;Sitting   Lower Body Dressing: Maximal assistance;+2 for physical assistance;Sit to/from stand   Toilet Transfer: Maximal assistance;+2 for physical assistance;+2 for safety/equipment;BSC;RW;Stand-pivot   Toileting- Clothing Manipulation and Hygiene: Maximal assistance;+2 for physical assistance;+2 for safety/equipment Toileting - Clothing Manipulation Details (indicate cue type and reason): After BM - pt required totat A for hygiene in standing, BUE reliant on ecxternal support in standing     Functional mobility during ADLs: Moderate assistance;+2 for  physical assistance;+2 for safety/equipment;Rolling walker;Cueing for safety;Cueing for sequencing General ADL Comments: pt would benefit from AE training to increase indep in lower body ADLs     Vision Patient Visual Report: No change from baseline              Pertinent  Vitals/Pain Pain Assessment: 0-10 Pain Score: 8  Pain Location: L LE (after movement, pt reporting pain at a 4/10) Pain Descriptors / Indicators: Grimacing;Guarding Pain Intervention(s): Monitored during session;Repositioned        Extremity/Trunk Assessment Upper Extremity Assessment Upper Extremity Assessment: Overall WFL for tasks assessed   Lower Extremity Assessment Lower Extremity Assessment: Defer to PT evaluation   Cervical / Trunk Assessment Cervical / Trunk Assessment: Normal   Communication Communication Communication: No difficulties   Cognition Arousal/Alertness: Awake/alert Behavior During Therapy: Anxious Overall Cognitive Status: Within Functional Limits for tasks assessed           General Comments  VSS on RA - pt anxious for movement throghout and apologetic            Home Living Family/patient expects to be discharged to:: Private residence Living Arrangements: Spouse/significant other Available Help at Discharge: Family;Available 24 hours/day Type of Home: House Home Access: Stairs to enter [employer] of Steps: 3 Entrance Stairs-Rails: None Home Layout: Multi-level;Bed/bath upstairs (husband in downstairs room) Alternate Level Stairs-Number of Steps: flight Alternate Level Stairs-Rails: Right Bathroom Shower/Tub: Occupational psychologist: Standard     Home Equipment: Crutches;Cane - single point;Hand held shower head   Additional Comments: caregiver to her husband who has balance impairments and early stage of dementia      Prior Functioning/Environment Level of Independence: Independent        Comments: pt completely independent prior, managing home, taking care of husband who has balance issues and beginning stages of dementia        OT Problem List: Decreased strength;Decreased range of motion;Decreased activity tolerance;Impaired balance (sitting and/or standing);Decreased knowledge of use of DME or  AE;Decreased safety awareness;Decreased knowledge of precautions;Pain      OT Treatment/Interventions: Self-care/ADL training;Therapeutic exercise;Patient/family education;Balance training;Therapeutic activities    OT Goals(Current goals can be found in the care plan section) Acute Rehab OT Goals Patient Stated Goal: return home, no SNF OT Goal Formulation: With patient Time For Goal Achievement: 07/06/21 Potential to Achieve Goals: Fair ADL Goals Pt Will Perform Grooming: with min guard assist;standing Pt Will Perform Lower Body Bathing: with min guard assist;sit to/from stand Pt Will Perform Lower Body Dressing: with min guard assist;sit to/from stand Pt Will Transfer to Toilet: with min guard assist;ambulating Pt Will Perform Toileting - Clothing Manipulation and hygiene: with supervision;sitting/lateral leans  OT Frequency: Min 2X/week           Co-evaluation PT/OT/SLP Co-Evaluation/Treatment: Yes Reason for Co-Treatment: For patient/therapist safety;To address functional/ADL transfers PT goals addressed during session: Mobility/safety with mobility;Balance;Proper use of DME;Strengthening/ROM OT goals addressed during session: ADL's and self-care;Proper use of Adaptive equipment and DME;Strengthening/ROM      AM-PAC OT "6 Clicks" Daily Activity     Outcome Measure Help from another person eating meals?: None Help from another person taking care of personal grooming?: A Little Help from another person toileting, which includes using toliet, bedpan, or urinal?: A Lot Help from another person bathing (including washing, rinsing, drying)?: A Lot Help from another person to put on and taking off regular upper body clothing?: A Little Help from another person to put on and taking off regular  lower body clothing?: A Lot 6 Click Score: 16   End of Session Equipment Utilized During Treatment: Gait belt;Rolling walker;Other (comment) [facility]) Nurse Communication: Mobility  status  Activity Tolerance: Patient tolerated treatment well Patient left: in chair;with call bell/phone within reach  OT Visit Diagnosis: Other abnormalities of gait and mobility (R26.89);Repeated falls (R29.6);Muscle weakness (generalized) (M62.81);History of falling (Z91.81);Pain                Time: 6203-5597 OT Time Calculation (min): 42 min Charges:  OT General Charges $OT Visit: 1 Visit OT Evaluation $OT Eval Moderate Complexity: 1 Mod OT Treatments $Self Care/Home Management : 8-22 mins    Keyshaun Exley A Tilmon Wisehart 06/22/2021, 12:45 PM

## 2021-06-22 NOTE — Progress Notes (Signed)
Orthopaedic Trauma Service Progress Note  Patient ID: Carrie Chung MRN: [id number] DOB/AGE: [birth year] 81 y.o.  Subjective:  Doing fair this morning mobilizing to chair with therapy  + BM   ROS As above Objective:   VITALS:   Vitals:   06/20/21 2100 06/21/21 0749 06/21/21 1220 06/21/21 2117  BP: (!) 112/48 (!) 106/52 (!) 98/48 105/66  Pulse: 86 72 69 89  Resp: 17 18 18    Temp: 98.1 F (36.7 C) 98.6 F (37 C) 98.4 F (36.9 C) 98.8 F (37.1 C)  TempSrc: Oral Oral Oral Oral  SpO2: 98% 98% 92%   Weight:      Height:        Estimated body mass index is 28.34 kg/m as calculated from the following:   Height as of this encounter: 5\' 1"  (1.549 m).   Weight as of this encounter: 68 kg.   Intake/Output      07/02 0701 07/03 0700 07/03 0701 07/04 0700   P.O. 600    I.V. (mL/kg)     Total Intake(mL/kg) 600 (8.8)    Urine (mL/kg/hr)     Blood     Total Output     Net +600         Urine Occurrence 1 x      LABS  Results for orders placed or performed during the hospital encounter of 06/19/21 (from the past 24 hour(s))  CBC     Status: Abnormal   Collection Time: 06/22/21  1:41 AM  Result Value Ref Range   WBC 7.1 4.0 - 10.5 K/uL   RBC 2.50 (L) 3.87 - 5.11 MIL/uL   Hemoglobin 8.2 (L) 12.0 - 15.0 g/dL   HCT 24.1 (L) 36.0 - 46.0 %   MCV 96.4 80.0 - 100.0 fL   MCH 32.8 26.0 - 34.0 pg   MCHC 34.0 30.0 - 36.0 g/dL   RDW 13.3 11.5 - 15.5 %   Platelets 140 (L) 150 - 400 K/uL   nRBC 0.0 0.0 - 0.2 %  Basic metabolic panel     Status: Abnormal   Collection Time: 06/22/21  1:41 AM  Result Value Ref Range   Sodium 137 135 - 145 mmol/L   Potassium 3.7 3.5 - 5.1 mmol/L   Chloride 98 98 - 111 mmol/L   CO2 32 22 - 32 mmol/L   Glucose, Bld 138 (H) 70 - 99 mg/dL   BUN 9 8 - 23 mg/dL   Creatinine, Ser 0.66 0.44 - 1.00 mg/dL   Calcium 8.4 (L) 8.9 - 10.3 mg/dL   GFR, Estimated >60 >60 mL/min    Anion gap 7 5 - 15   *Note: Due to a large number of results and/or encounters for the requested time period, some results have not been displayed. A complete set of results can be found in Results Review.     PHYSICAL EXAM:   Gen: Pleasant, NAD, having a little bit of more pain mobilizing to chair.  However she was able to get a little more comfortable when she was in the chair and stationary Lungs: Unlabored Cardiac: s1 and s2 Ext:       left lower extremity  Dressings are clean, dry and intact to the left thigh  Extremity is warm  + DP pulse  No DCT  Compartments are soft  Distal motor and sensory functions are intact  Assessment/Plan: 2 Days Post-Op   Principal Problem:   Closed left hip fracture, initial encounter ([place]) Active Problems:   HYPERTENSION, BENIGN   Prolonged QT interval   Coronary Artery Disease   Depression with anxiety   Hypoxemic respiratory failure, chronic (HCC)   Hypokalemia   Anti-infectives (From admission, onward)    Start     Dose/Rate Route Frequency Ordered Stop   06/21/21 0600  vancomycin (VANCOREADY) IVPB 1250 mg/250 mL  Status:  Discontinued        1,250 mg 166.7 mL/hr over 90 Minutes Intravenous On call to O.R. 06/20/21 1240 06/20/21 1611   06/20/21 1715  ceFAZolin (ANCEF) IVPB 2g/100 mL premix        2 g 200 mL/hr over 30 Minutes Intravenous Every 6 hours 06/20/21 1617 06/21/21 0107   06/20/21 1246  vancomycin (VANCOCIN) 1-5 GM/200ML-% IVPB       Note to Pharmacy: Carrie Chung   : cabinet override      06/20/21 1246 06/20/21 1327     .  POD/HD#: 54  80 year old female with left hip fracture s/p IMN L hip    Weightbearing: WBAT LLE Insicional and dressing care: Daily dressing changes with Mepilex or 4 x 4's and tape Orthopedic device(s):  Walker Showering: Okay to shower and clean wounds with soap and water VTE prophylaxis: Patient has been restarted on her preadmission antiplatelet therapy per protocol (Plavix and  low-dose aspirin), scds Pain control: Multimodal: We will schedule Tylenol, Norco as needed Follow - up plan: 2-week follow-up with Dr. Percell Miller  Dispo: Continue with therapies, inpatient rehab following.  Optimize pain control.  Minimize IV narcotics.  Encourage oral pain medication (nonnarcotic followed by opioid and then IV medication if absolutely needed)    Jari Pigg, PA-C [phone] (C) 06/22/2021, 12:06 PM  [facility] [place] [id number] [phone] Jenetta [phone] (F)    After 5pm and on the weekends please log on to Amion, go to orthopaedics and the look under the [facility] Call for the provider(s) on call. You can also call our office at [phone] and then follow the prompts to be connected to the call team.

## 2021-06-22 NOTE — PMR Pre-admission (Signed)
PMR Admission Coordinator Pre-Admission Assessment  Patient: Carrie Chung is an 80 y.o., female MRN: [id number] DOB: 1940-12-26 Height: 5\' 1"  (154.9 cm) Weight: 68 kg  Insurance Information HMO:     PPO:      PCP:      IPA:      80/20: yes     OTHER:  PRIMARY: Medicare A & B      Policy#: [id number]      Subscriber: patient CM Name:       Phone#:      Fax#:  Pre-Cert#: verified online      Employer:  Benefits:  Phone #: Verified eligibility via [employer] on 06/22/21     Name:  Eff. Date: Part A & B effective 08/21/05     Deduct: $1,556      Out of Pocket Max: NA      Life Max: NA CIR: 100% with Medicare approval      SNF: 100% days 1-20, 80% days 21-100 Outpatient: 80%     Co-Pay: 20% Home Health: 100%      Co-Pay:  DME: 80%     Co-Pay: 20% Providers: pt's choice SECONDARY: BCBS      Policy#: [id number]     Phone#: [phone]  Financial Counselor:       Phone#:   The "Data Collection Information Summary" for patients in Inpatient Rehabilitation Facilities with attached "Privacy Act [place] Records" was provided and verbally reviewed with: Family  Emergency Contact Information Contact Information     Name Relation Home Work [place] Daughter   [phone]       Current Medical History  Patient Admitting Diagnosis: closed Left hip fx History of Present Illness: Carrie Chung is an 80 year old right-handed female with history of obstructive CAD status post stenting maintained on aspirin and Plavix, atrial tachycardia, hypertension, depression/anxiety, nocturnal oxygen requirement and quit smoking 41 years ago.  Presented 06/19/2021 after mechanical fall landing on left hip without loss of consciousness.  Cranial CT scan negative.  X-rays and imaging revealed acute left femoral intertrochanteric hip fracture.  Status post IM nailing 06/20/2021 per Dr. Percell Miller.  Weightbearing as tolerated left lower extremity.  Patient's chronic aspirin and  Plavix has since been resumed postoperatively.  Acute blood loss anemia 8.8 and monitored.  Therapy evaluations completed due to patient's decreased functional mobility and pain, and she was recommended for a comprehensive rehab program.    Patient's medical record from [facility] has been reviewed by the rehabilitation admission coordinator and physician.  Past Medical History  Past Medical History:  Diagnosis Date   Atrial tachycardia ([place])    a. noted at cardiac rehab 5/13; event monitor ordered to assess for AFib   CAD (coronary artery disease)    a. NSTEMI 03/2012 ([place] 03/27/12: pLAD 30%, mLAD 99%, mCFX 95%, mRCA 99% with thrombus, EF 65%);  s/p PTCA/DESx1 to prox-mid RCA 03/27/12 urgently in setting of hypotension/bradycardia, with staged PTCA/Evolve study stent to mid LAD & PTCA/Evolve study stent to prox LCx 03/29/12 ;   echo 03/28/12:EF 60%, Aortic sclerosis without AS, mild RVE, mild reduced RVSF     Endometrial polyp    HTN (hypertension)    Hyperlipidemia    MI, old    Sleep apnea     Family History   family history includes Cancer - Other in her father; Colon cancer in her father; Coronary artery disease in her mother.  Prior Rehab/Hospitalizations Has the patient had prior rehab or  hospitalizations prior to admission? No  Has the patient had major surgery during 100 days prior to admission? Yes   Current Medications  Current Facility-Administered Medications:    acetaminophen (TYLENOL) tablet 1,000 mg, 1,000 mg, Oral, Q8H, Ainsley Spinner, PA-C, 1,000 mg at 06/24/21 4098   ALPRAZolam Duanne Moron) tablet 1 mg, 1 mg, Oral, QHS PRN, Gawne, Meghan M, PA-C, 1 mg at 06/22/21 2130   alum & mag hydroxide-simeth (MAALOX/MYLANTA) 200-200-20 MG/5ML suspension 30 mL, 30 mL, Oral, Q4H PRN, Gawne, Meghan M, PA-C, 30 mL at 06/21/21 2109   aspirin EC tablet 81 mg, 81 mg, Oral, Daily, Little Ishikawa, MD, 81 mg at 06/24/21 1191   bisacodyl (DULCOLAX) suppository 10 mg, 10 mg, Rectal, Daily  PRN, Gawne, Meghan M, PA-C   clopidogrel (PLAVIX) tablet 75 mg, 75 mg, Oral, Daily, Skeet Simmer, RPH, 75 mg at 06/24/21 4782   docusate sodium (COLACE) capsule 100 mg, 100 mg, Oral, BID, Gawne, Meghan M, PA-C, 100 mg at 06/23/21 2116   feeding supplement (ENSURE ENLIVE / ENSURE PLUS) liquid 237 mL, 237 mL, Oral, BID BM, Gawne, Meghan M, PA-C, 237 mL at 06/22/21 0948   magnesium citrate solution 1 Bottle, 1 Bottle, Oral, Once PRN, Gawne, Meghan M, PA-C   menthol-cetylpyridinium (CEPACOL) lozenge 3 mg, 1 lozenge, Oral, PRN **OR** phenol (CHLORASEPTIC) mouth spray 1 spray, 1 spray, Mouth/Throat, PRN, Gawne, Meghan M, PA-C   methocarbamol (ROBAXIN) tablet 500 mg, 500 mg, Oral, Q6H PRN, Little Ishikawa, MD   metoprolol tartrate (LOPRESSOR) tablet 25 mg, 25 mg, Oral, BID, Little Ishikawa, MD, 25 mg at 06/24/21 9562   morphine 2 MG/ML injection 0.5-1 mg, 0.5-1 mg, Intravenous, Q2H PRN, Gawne, Meghan M, PA-C, 1 mg at 06/23/21 1136   multivitamin with minerals tablet 1 tablet, 1 tablet, Oral, Daily, Gawne, Meghan M, PA-C, 1 tablet at 06/24/21 0851   ondansetron (ZOFRAN) tablet 4 mg, 4 mg, Oral, Q6H PRN **OR** ondansetron (ZOFRAN) injection 4 mg, 4 mg, Intravenous, Q6H PRN, Gawne, Meghan M, PA-C   oxyCODONE (Oxy IR/ROXICODONE) immediate release tablet 5-10 mg, 5-10 mg, Oral, Q4H PRN, Ainsley Spinner, PA-C, 5 mg at 06/24/21 1045   pantoprazole (PROTONIX) EC tablet 40 mg, 40 mg, Oral, Daily, Gawne, Meghan M, PA-C, 40 mg at 06/24/21 0851   polyethylene glycol (MIRALAX / GLYCOLAX) packet 17 g, 17 g, Oral, Daily PRN, Gawne, Meghan M, PA-C   rosuvastatin (CRESTOR) tablet 20 mg, 20 mg, Oral, QHS, Gawne, Meghan M, PA-C, 20 mg at 06/23/21 2116   senna-docusate (Senokot-S) tablet 1 tablet, 1 tablet, Oral, QHS PRN, Gawne, Meghan M, PA-C   venlafaxine XR (EFFEXOR-XR) 24 hr capsule 75 mg, 75 mg, Oral, Q breakfast, Gawne, Meghan M, PA-C, 75 mg at 06/24/21 1308  Patients Current Diet:  Diet Order              Diet - low sodium heart healthy           Diet heart healthy/carb modified Room service appropriate? Yes; Fluid consistency: Thin  Diet effective now                   Precautions / Restrictions Precautions Precautions: Fall Restrictions Weight Bearing Restrictions: Yes LLE Weight Bearing: Weight bearing as tolerated   Has the patient had 2 or more falls or a fall with injury in the past year? Yes  Prior Activity Level Limited Community (1-2x/wk): ~3 days/week gets out of house; drives  Prior Functional Level Self Care: Did the patient  need help bathing, dressing, using the toilet or eating? Independent  Indoor Mobility: Did the patient need assistance with walking from room to room (with or without device)? Independent  Stairs: Did the patient need assistance with internal or external stairs (with or without device)? Independent  Functional Cognition: Did the patient need help planning regular tasks such as shopping or remembering to take medications? Independent  Home Assistive Devices / Equipment Home Assistive Devices/Equipment: None Home Equipment: Crutches, Cane - single point, Hand held shower head  Prior Device Use: Indicate devices/aids used by the patient prior to current illness, exacerbation or injury? 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    Extremity Assessment (includes Sensation/Coordination)  Upper Extremity Assessment: Overall WFL for tasks assessed  Lower Extremity Assessment: Defer to PT evaluation LLE Deficits / Details: reports no numbness or tingling, able to move at ankle and toes. limited knee and hip due to pain LLE: Unable to  fully assess due to pain LLE Sensation: WNL    ADLs  Overall ADL's : Needs assistance/impaired Eating/Feeding: Independent, Sitting Grooming: Wash/dry hands, Wash/dry face, Applying deodorant, Oral care, Set up, Sitting Upper Body Bathing: Set up, Sitting Lower Body Bathing: Maximal assistance, +2 for physical assistance, Sit to/from stand Upper Body Dressing : Set up, Sitting Lower Body Dressing: Maximal assistance, +2 for physical assistance, Sit to/from stand Toilet Transfer: Maximal assistance, +2 for physical assistance, +2 for safety/equipment, BSC, RW, Stand-pivot Toileting- Clothing Manipulation and Hygiene: Maximal assistance, +2 for physical assistance, +2 for safety/equipment Toileting - Clothing Manipulation Details (indicate cue type and reason): After BM - pt required totat A for hygiene in standing, BUE reliant on ecxternal support in standing Functional mobility during ADLs: Moderate assistance, +2 for physical assistance, +2 for safety/equipment, Rolling walker, Cueing for safety, Cueing for sequencing General ADL Comments: pt would benefit from AE training to increase indep in lower body ADLs    Mobility  Overal bed mobility: Needs Assistance Bed Mobility: Supine to Sit Supine to sit: HOB elevated, Mod assist Sit to supine: Max assist, HOB elevated General bed mobility comments: increased time and effort needed, use of bed rails, assistance needed for L LE movement off of bed and use of bed pads to scoot hips forwards towards EOB, pt with good initiation needs modA more for scooting hips to EOB than for trunk rise    Transfers  Overall transfer level: Needs assistance Equipment used: Rolling walker (2 wheeled) Transfers: Sit to/from Stand, [employer] Transfers Sit to Stand: Mod assist, +2 physical assistance Stand pivot transfers: Mod assist, +2 physical assistance, Max assist  Lateral/Scoot Transfers: Mod assist General transfer comment: verbal and tactile cueing  needed for safe hand placement and technique with RW, mod A x2 needed to power into a full upright standing position and modA progressing to maxA to pivot to [facility] towards her R side. She then performed a second sit<>stand from [facility] and needed recliner chair pulled behind her in standing as she was too fatigued to pivot from [facility].    Ambulation / Gait / Stairs / Wheelchair Mobility  Ambulation/Gait General Gait Details: unable; ~2-3 pivotal steps prior to sitting impulsively; poor weight acceptance through LLE    Posture / Balance Balance Overall balance  assessment: Needs assistance Sitting-balance support: Feet supported Sitting balance-Leahy Scale: Fair Standing balance support: During functional activity, Bilateral upper extremity supported Standing balance-Leahy Scale: Poor Standing balance comment: reliant on RW and +2 mod/maxA    Special needs/care consideration Skin Surgical incision: Left hip and Designated visitor Kathleen Argue, daughter   Previous Home Environment (from acute therapy documentation) Living Arrangements: Spouse/significant other  Lives With: Spouse Available Help at Discharge: Family, Available 24 hours/day Type of Home: House Home Layout: Two level, Bed/bath upstairs, Other (Comment) (plan to make arrangements for pt to sleep downstairs once home) Alternate Level Stairs-Rails: Right Alternate Level Stairs-Number of Steps: 14 Home Access: Stairs to enter Entrance Stairs-Rails: None Entrance Stairs-Number of Steps: 3 in front, 4 in garage Bathroom Shower/Tub: Chiropodist: Standard Bathroom Accessibility: Yes How Accessible: Accessible via walker Home Care Services: No Additional Comments: caregiver to her husband who has balance impairments and early stage of dementia  Discharge Living Setting Plans for Discharge Living Setting: Patient's home Type of Home at Discharge: House Discharge Home Layout: Two level, Bed/bath upstairs, Other  (Comment) (plan to make arrangements for pt to sleep downstairs when returns home) Alternate Level Stairs-Rails: Right Alternate Level Stairs-Number of Steps: 14 Discharge Home Access: Stairs to enter Entrance Stairs-Rails: None Entrance Stairs-Number of Steps: 3 in front, 4 in garage Discharge Bathroom Shower/Tub: Tub/shower unit Discharge Bathroom Toilet: Standard Discharge Bathroom Accessibility: Yes How Accessible: Accessible via walker Does the patient have any problems obtaining your medications?: No  Social/Family/Support Systems Patient Roles: Caregiver Anticipated Caregiver: Reeves Forth (daughter) is main contact Anticipated Ambulance person Information: [phone] Caregiver Availability: Intermittent (family planning on hiring assistance) Discharge Plan Discussed with Primary Caregiver: Yes Is Caregiver In Agreement with Plan?: Yes Does Caregiver/Family have Issues with Lodging/Transportation while Pt is in Rehab?: No  Goals Patient/Family Goal for Rehab: PT/OT supervision; SLP n/a Expected length of stay: 21-24 days Pt/Family Agrees to Admission and willing to participate: Yes Program Orientation Provided & Reviewed with Pt/Caregiver Including Roles  & Responsibilities: Yes  Barriers to Discharge: Decreased caregiver support  Decrease burden of Care through IP rehab admission: NA  Possible need for SNF placement upon discharge: Not anticipated  Patient Condition: I have reviewed medical records from [facility], spoken with CM, and patient and daughter. I discussed via phone for inpatient rehabilitation assessment.  Patient will benefit from ongoing PT and OT, can actively participate in 3 hours of therapy a day 5 days of the week, and can make measurable gains during the admission.  Patient will also benefit from the coordinated team approach during an Inpatient Acute Rehabilitation admission.  The patient will receive intensive therapy as well as Rehabilitation  physician, nursing, social worker, and care management interventions.  Due to safety, skin/wound care, disease management, medication administration, pain management, and patient education the patient requires 24 hour a day rehabilitation nursing.  The patient is currently mod/max +2 with mobility and basic ADLs.  Discharge setting and therapy post discharge at  home  is anticipated.  Patient has agreed to participate in the Acute Inpatient Rehabilitation Program and will admit today.  Preadmission Screen Completed By:  Michel Santee, 06/24/2021 12:20 PM ______________________________________________________________________   Discussed status with Dr. Ranell Patrick on 06/24/21  at 12:20 PM  and received approval for admission today.  Admission Coordinator:  Michel Santee, PT, time 12:20 PM Sudie Grumbling 06/24/21    Assessment/Plan: Diagnosis: Closed left hip fracture Does the need for close, 24 hr/day Medical supervision in concert with  the patient's rehab needs make it unreasonable for this patient to be served in a less intensive setting? Yes Co-Morbidities requiring supervision/potential complications: overweight (BMI 28/34), significant post-operative pain requiring morphine, insomnia, anxiety, HTN, CAD Due to bladder management, bowel management, safety, skin/wound care, disease management, medication administration, pain management, and patient education, does the patient require 24 hr/day rehab nursing? Yes Does the patient require coordinated care of a physician, rehab nurse, PT, OT to address physical and functional deficits in the context of the above medical diagnosis(es)? Yes Addressing deficits in the following areas: balance, endurance, locomotion, strength, transferring, bowel/bladder control, bathing, dressing, feeding, grooming, toileting, and psychosocial support Can the patient actively participate in an intensive therapy program of at least 3 hrs of therapy 5 days a week? Yes The potential  for patient to make measurable gains while on inpatient rehab is good Anticipated functional outcomes upon discharge from inpatient rehab: modified independent PT, modified independent OT, independent SLP Estimated rehab length of stay to reach the above functional goals is: 2-3 weeks Anticipated discharge destination: Home 10. Overall Rehab/Functional Prognosis: good   MD Signature: Leeroy Cha, MD

## 2021-06-22 NOTE — Progress Notes (Signed)
Physical Therapy Treatment Patient Details Name: Carrie Chung MRN: [id number] DOB: Feb 09, 1941 Today's Date: 06/22/2021    History of Present Illness Pt is an 80 y/o female presenting on 6/30 after a mechanical fall at home with c/o L hip pain. Imaging revealed acute left femoral intertrochanteric fracture, now s/p IM nail on 7/1. PMH includes: CAD, atrial tachycardia, NSTEMI, HTN, HLD, and multiple stent placements.    PT Comments    Pt making steady progress overall with mobility and was able to tolerate getting OOB today. She continues to require heavy physical assistance of two people to mobilize with use of RW as well. She was able to transfer from the bed to the [facility] and then to the recliner chair today. She continues to demonstrate difficulties with accepting weight through her L LE secondary to pain, but hesitant to take pain meds. PT will continue to follow pt acutely to progress mobility as tolerated.    Follow Up Recommendations  CIR     Equipment Recommendations  Other (comment) (defer to next venue of care)    Recommendations for Other Services       Precautions / Restrictions Precautions Precautions: Fall Restrictions Weight Bearing Restrictions: Yes LLE Weight Bearing: Weight bearing as tolerated    Mobility  Bed Mobility Overal bed mobility: Needs Assistance Bed Mobility: Supine to Sit     Supine to sit: HOB elevated;Mod assist     General bed mobility comments: increased time and effort needed, use of bed rails, assistance needed for L LE movement off of bed and use of bed pads to scoot hips forwards towards EOB    Transfers Overall transfer level: Needs assistance Equipment used: Rolling walker (2 wheeled) Transfers: Sit to/from [facility] Sit to Stand: Mod assist;+2 physical assistance Stand pivot transfers: Mod assist;+2 physical assistance       General transfer comment: verbal and tactile cueing needed for safe hand  placement and technique with RW, mod A x2 needed to power into a full upright standing position and to pivot to [facility] towards her R side. She then performed a second sit<>stand from [facility] and pivoted to the recliner chair on her R with mod A x2  Ambulation/Gait             General Gait Details: unable   Stairs             Wheelchair Mobility    Modified Rankin (Stroke Patients Only)       Balance Overall balance assessment: Needs assistance Sitting-balance support: Feet supported Sitting balance-Leahy Scale: Fair     Standing balance support: During functional activity;Bilateral upper extremity supported Standing balance-Leahy Scale: Poor                              Cognition Arousal/Alertness: Awake/alert Behavior During Therapy: Anxious Overall Cognitive Status: Within Functional Limits for tasks assessed                                        Exercises      General Comments        Pertinent Vitals/Pain Pain Assessment: 0-10 Pain Score: 8  Pain Location: L LE (after movement, pt reporting pain at a 4/10) Pain Descriptors / Indicators: Grimacing;Guarding Pain Intervention(s): Monitored during session;Repositioned    Home Living Family/patient expects to be discharged to:: Private  residence Living Arrangements: Spouse/significant other Available Help at Discharge: Family;Available 24 hours/day Type of Home: House Home Access: Stairs to enter Entrance Stairs-Rails: None Home Layout: Multi-level;Bed/bath upstairs (husband in downstairs room) Home Equipment: Crutches;Cane - single point;Hand held shower head Additional Comments: caregiver to her husband who has balance impairments and early stage of dementia    Prior Function Level of Independence: Independent      Comments: pt completely independent prior, managing home, taking care of husband who has balance issues and beginning stages of dementia   PT Goals (current  goals can now be found in the care plan section) Acute Rehab PT Goals PT Goal Formulation: With patient Time For Goal Achievement: 07/05/21 Potential to Achieve Goals: Good Progress towards PT goals: Progressing toward goals    Frequency    Min 4X/week      PT Plan Current plan remains appropriate    Co-evaluation PT/OT/SLP Co-Evaluation/Treatment: Yes Reason for Co-Treatment: For patient/therapist safety;To address functional/ADL transfers PT goals addressed during session: Mobility/safety with mobility;Balance;Proper use of DME;Strengthening/ROM        AM-PAC PT "6 Clicks" Mobility   Outcome Measure  Help needed turning from your back to your side while in a flat bed without using bedrails?: A Lot Help needed moving from lying on your back to sitting on the side of a flat bed without using bedrails?: A Lot Help needed moving to and from a bed to a chair (including a wheelchair)?: A Lot Help needed standing up from a chair using your arms (e.g., wheelchair or bedside chair)?: A Lot Help needed to walk in hospital room?: Total Help needed climbing 3-5 steps with a railing? : Total 6 Click Score: 10    End of Session Equipment Utilized During Treatment: Gait belt Activity Tolerance: Patient limited by pain Patient left: in chair;with call bell/phone within reach Nurse Communication: Mobility status PT Visit Diagnosis: Other abnormalities of gait and mobility (R26.89);Muscle weakness (generalized) (M62.81);Pain Pain - Right/Left: Left Pain - part of body: Leg     Time: 3664-4034 PT Time Calculation (min) (ACUTE ONLY): 39 min  Charges:  $Therapeutic Activity: 8-22 mins                     Anastasio Champion, DPT  Acute Rehabilitation Services Office [place] 06/22/2021, 12:13 PM

## 2021-06-22 NOTE — Progress Notes (Signed)
PROGRESS NOTE    Carrie Chung  ZOX:[id number] DOB: 07/14/1941 DOA: 06/19/2021 PCP: [name], MD   Brief Narrative:  Carrie Chung is a 80 y.o. female with medical history significant for coronary artery disease, atrial tachycardia, hypertension, nocturnal oxygen requirement, depression, and anxiety, now presenting to the emergency department for evaluation of left hip pain after a fall.  CT of the left hip demonstrates comminuted impacted acute left femoral intertrochanteric fracture.  Orthopedic surgery was consulted by the ED physician and hospitalists asked to admit.  Assessment & Plan:   Principal Problem:   Closed left hip fracture, initial encounter ([place]) Active Problems:   HYPERTENSION, BENIGN   Prolonged QT interval   Coronary Artery Disease   Depression with anxiety   Hypoxemic respiratory failure, chronic (HCC)   Hypokalemia   Left hip fracture status post mechanical fall -Orthopedics following, appreciate insight and recommendation -ORIF per surgery schedule - Hold ASA and Plavix, continue NPO and pain-control  - Difficulty with PT initially but improving today - out of bed but still requiring 2+ assistance. Continues to have issues with pain control during mobility - offered PRN IV morphine today with PT but patient not wanting more narcotics than necessary.   Prolonged QT interval  - QTc is 561 --> 540 ms in setting of hypokalemia at intake - Continue cardiac monitoring, replace potassium, check magnesium, minimize QT-prolonging medications, repeat EKG shows sinus rhythm   Hypokalemia, resolved - Serum potassium 2.8 on admission - WNL today   Obstructive CAD history status post stent 2013 - No recent issues or events from a cardiac standpoint -Resume aspirin, Plavix, metoprolol  Depression, anxiety  - Continue SNRI and as-needed Xanax   Hypertension  - Continue metoprolol - Borderline hypotension in the setting of narcotics, follow closely  while holding amlodipine and HCTZ   DVT prophylaxis: SCDs  Code Status: Full   Status is: Inpatient  Dispo: The patient is from: Home              Anticipated d/c is to: To be determined              Anticipated d/c date is: 48 to 72 hours              Patient currently not medically stable for discharge  Consultants:  Orthopedic surgery  Procedures:  ORIF left hip per orthopedic surgery schedule  Antimicrobials:  None indicated  Subjective: No acute issues or events overnight, pain better controlled today but patient still anxious about PT/activity. Denies headache, fevers, chills, shortness of breath.  Objective: Vitals:   06/20/21 2100 06/21/21 0749 06/21/21 1220 06/21/21 2117  BP: (!) 112/48 (!) 106/52 (!) 98/48 105/66  Pulse: 86 72 69 89  Resp: 17 18 18    Temp: 98.1 F (36.7 C) 98.6 F (37 C) 98.4 F (36.9 C) 98.8 F (37.1 C)  TempSrc: Oral Oral Oral Oral  SpO2: 98% 98% 92%   Weight:      Height:        Intake/Output Summary (Last 24 hours) at 06/22/2021 0841 Last data filed at 06/22/2021 0600 Gross per 24 hour  Intake 600 ml  Output --  Net 600 ml    Filed Weights   06/19/21 2130  Weight: 68 kg    Examination:  General:  Pleasantly resting in bed, No acute distress. HEENT:  Normocephalic atraumatic.  Sclerae nonicteric, noninjected.  Extraocular movements intact bilaterally. Neck:  Without mass or deformity.  Trachea is midline.  Lungs:  Clear to auscultate bilaterally without rhonchi, wheeze, or rales. Heart:  Regular rate and rhythm.  Without murmurs, rubs, or gallops. Abdomen:  Soft, nontender, nondistended.  Without guarding or rebound. Extremities: Without cyanosis, clubbing, edema, or obvious deformity. Vascular:  Dorsalis pedis and posterior tibial pulses palpable bilaterally. Skin:  Warm and dry, no erythema, no ulcerations.  Postop bandage clean dry intact.  Data Reviewed: I have personally reviewed following labs and imaging  studies  CBC: Recent Labs  Lab 06/19/21 2210 06/20/21 0328 06/21/21 0153 06/22/21 0141  WBC 6.7 7.5 6.9 7.1  NEUTROABS 5.0  --   --   --   HGB 13.2 12.7 9.6* 8.2*  HCT 38.6 36.7 28.4* 24.1*  MCV 96.0 96.3 96.9 96.4  PLT 146* 128* 130* 140*    Basic Metabolic Panel: Recent Labs  Lab 06/19/21 2210 06/20/21 0328 06/21/21 0153 06/22/21 0141  NA 136 135 135 137  K 2.8* 3.4* 3.9 3.7  CL 96* 95* 96* 98  CO2 27 31 31  32  GLUCOSE 122* 150* 193* 138*  BUN 5* 6* 6* 9  CREATININE 0.62 0.66 0.66 0.66  CALCIUM 8.6* 8.6* 8.6* 8.4*  MG  --  2.3  --   --     GFR: Estimated Creatinine Clearance: 49.5 mL/min (by C-G formula based on SCr of 0.66 mg/dL). Liver Function Tests: No results for input(s): AST, ALT, ALKPHOS, BILITOT, PROT, ALBUMIN in the last 168 hours. No results for input(s): LIPASE, AMYLASE in the last 168 hours. No results for input(s): AMMONIA in the last 168 hours. Coagulation Profile: Recent Labs  Lab 06/19/21 2210  INR 1.0    Cardiac Enzymes: No results for input(s): CKTOTAL, CKMB, CKMBINDEX, TROPONINI in the last 168 hours. BNP (last 3 results) No results for input(s): PROBNP in the last 8760 hours. HbA1C: No results for input(s): HGBA1C in the last 72 hours. CBG: No results for input(s): GLUCAP in the last 168 hours. Lipid Profile: No results for input(s): CHOL, HDL, LDLCALC, TRIG, CHOLHDL, LDLDIRECT in the last 72 hours. Thyroid Function Tests: No results for input(s): TSH, T4TOTAL, FREET4, T3FREE, THYROIDAB in the last 72 hours. Anemia Panel: No results for input(s): VITAMINB12, FOLATE, FERRITIN, TIBC, IRON, RETICCTPCT in the last 72 hours. Sepsis Labs: No results for input(s): PROCALCITON, LATICACIDVEN in the last 168 hours.  Recent Results (from the past 240 hour(s))  Resp Panel by RT-PCR (Flu A&B, Covid) Nasopharyngeal Swab     Status: None   Collection Time: 06/20/21 12:40 AM   Specimen: Nasopharyngeal Swab; Nasopharyngeal(NP) swabs in vial  transport medium  Result Value Ref Range Status   SARS Coronavirus 2 by RT PCR NEGATIVE NEGATIVE Final    Comment: (NOTE) SARS-CoV-2 target nucleic acids are NOT DETECTED.  The SARS-CoV-2 RNA is generally detectable in upper respiratory specimens during the acute phase of infection. The lowest concentration of SARS-CoV-2 viral copies this assay can detect is 138 copies/mL. A negative result does not preclude SARS-Cov-2 infection and should not be used as the sole basis for treatment or other patient management decisions. A negative result may occur with  improper specimen collection/handling, submission of specimen other than nasopharyngeal swab, presence of viral mutation(s) within the areas targeted by this assay, and inadequate number of viral copies(<138 copies/mL). A negative result must be combined with clinical observations, patient history, and epidemiological information. The expected result is Negative.  Fact Sheet for Patients:  EntrepreneurPulse.com.au  Fact Sheet for Healthcare Providers:  IncredibleEmployment.be  This test is no  t yet approved or cleared by the [place] and  has been authorized for detection and/or diagnosis of SARS-CoV-2 by FDA under an Emergency Use Authorization (EUA). This EUA will remain  in effect (meaning this test can be used) for the duration of the COVID-19 declaration under Section 564(b)(1) of the Act, 21 U.S.C.section 360bbb-3(b)(1), unless the authorization is terminated  or revoked sooner.       Influenza A by PCR NEGATIVE NEGATIVE Final   Influenza B by PCR NEGATIVE NEGATIVE Final    Comment: (NOTE) The Xpert Xpress SARS-CoV-2/FLU/RSV plus assay is intended as an aid in the diagnosis of influenza from Nasopharyngeal swab specimens and should not be used as a sole basis for treatment. Nasal washings and aspirates are unacceptable for Xpert Xpress SARS-CoV-2/FLU/RSV testing.  Fact  Sheet for Patients: EntrepreneurPulse.com.au  Fact Sheet for Healthcare Providers: IncredibleEmployment.be  This test is not yet approved or cleared by the [place] FDA and has been authorized for detection and/or diagnosis of SARS-CoV-2 by FDA under an Emergency Use Authorization (EUA). This EUA will remain in effect (meaning this test can be used) for the duration of the COVID-19 declaration under Section 564(b)(1) of the Act, 21 U.S.C. section 360bbb-3(b)(1), unless the authorization is terminated or revoked.  Performed at [facility], [place] [address], [place], [place] [address]   Surgical pcr screen     Status: None   Collection Time: 06/20/21  1:29 PM   Specimen: Nasal Mucosa; Nasal Swab  Result Value Ref Range Status   MRSA, PCR NEGATIVE NEGATIVE Final   Staphylococcus aureus NEGATIVE NEGATIVE Final    Comment: (NOTE) The Xpert SA Assay (FDA approved for NASAL specimens in patients 60 years of age and older), is one component of a comprehensive surveillance program. It is not intended to diagnose infection nor to guide or monitor treatment. Performed at [facility], [place] [address], [place], [place] [address]           Radiology Studies: DG C-Arm 1-60 Min  Result Date: 06/20/2021 CLINICAL DATA:  Intramedullary nail. EXAM: LEFT FEMUR 2 VIEWS; DG C-ARM 1-60 MIN COMPARISON:  Hip radiograph and CT yesterday FINDINGS: Five fluoroscopic spot views of the left femur obtained in the operating room. Intramedullary nail with trans trochanteric screw fixation of intertrochanteric femur fracture fluoroscopy time 38 seconds. Dose 7.67 mGy. IMPRESSION: Fluoroscopic spot views during ORIF left femur fracture. Electronically Signed   By: Keith Rake M.D.   On: 06/20/2021 15:24   DG FEMUR MIN 2 VIEWS LEFT  Result Date: 06/20/2021 CLINICAL DATA:  Intramedullary nail. EXAM: LEFT FEMUR 2 VIEWS; DG C-ARM 1-60 MIN COMPARISON:  Hip  radiograph and CT yesterday FINDINGS: Five fluoroscopic spot views of the left femur obtained in the operating room. Intramedullary nail with trans trochanteric screw fixation of intertrochanteric femur fracture fluoroscopy time 38 seconds. Dose 7.67 mGy. IMPRESSION: Fluoroscopic spot views during ORIF left femur fracture. Electronically Signed   By: Keith Rake M.D.   On: 06/20/2021 15:24     Scheduled Meds:  aspirin EC  81 mg Oral Daily   Chlorhexidine Gluconate Cloth  6 each Topical Daily   clopidogrel  75 mg Oral Daily   docusate sodium  100 mg Oral BID   feeding supplement  237 mL Oral BID BM   fentaNYL (SUBLIMAZE) injection  100 mcg Intravenous Once   metoprolol tartrate  75 mg Oral BID   multivitamin with minerals  1 tablet Oral Daily  pantoprazole  40 mg Oral Daily   rosuvastatin  20 mg Oral QHS   venlafaxine XR  75 mg Oral Q breakfast   Continuous Infusions:  methocarbamol (ROBAXIN) IV Stopped (06/20/21 1051)     LOS: 2 days   Time spent: 5min  Dede Dobesh C Creston Klas, DO [facility]  If 7PM-7AM, please contact night-coverage [email or web address]  06/22/2021, 8:41 AM

## 2021-06-23 LAB — CBC
HCT: 25.9 % — ABNORMAL LOW (ref 36.0–46.0)
Hemoglobin: 8.8 g/dL — ABNORMAL LOW (ref 12.0–15.0)
MCH: 33 pg (ref 26.0–34.0)
MCHC: 34 g/dL (ref 30.0–36.0)
MCV: 97 fL (ref 80.0–100.0)
Platelets: 161 10*3/uL (ref 150–400)
RBC: 2.67 MIL/uL — ABNORMAL LOW (ref 3.87–5.11)
RDW: 13.4 % (ref 11.5–15.5)
WBC: 5.2 10*3/uL (ref 4.0–10.5)
nRBC: 0.4 % — ABNORMAL HIGH (ref 0.0–0.2)

## 2021-06-23 MED ORDER — OXYCODONE HCL 5 MG PO TABS
5.0000 mg | ORAL_TABLET | ORAL | Status: DC | PRN
  Administered 2021-06-23 – 2021-06-24 (×5): 5 mg via ORAL
  Filled 2021-06-23 (×2): qty 1
  Filled 2021-06-23: qty 2
  Filled 2021-06-23 (×3): qty 1

## 2021-06-23 MED ORDER — ACETAMINOPHEN 500 MG PO TABS
1000.0000 mg | ORAL_TABLET | Freq: Three times a day (TID) | ORAL | Status: DC
  Administered 2021-06-23 – 2021-06-24 (×4): 1000 mg via ORAL
  Filled 2021-06-23 (×4): qty 2

## 2021-06-23 NOTE — Progress Notes (Signed)
PROGRESS NOTE    KELLIANNE EK  NTZ:[id number] DOB: 11/27/1941 DOA: 06/19/2021 PCP: [name], MD   Brief Narrative:  CORTNE AMARA is a 80 y.o. female with medical history significant for coronary artery disease, atrial tachycardia, hypertension, nocturnal oxygen requirement, depression, and anxiety, now presenting to the emergency department for evaluation of left hip pain after a fall.  CT of the left hip demonstrates comminuted impacted acute left femoral intertrochanteric fracture.  Orthopedic surgery was consulted by the ED physician and hospitalists asked to admit.  Assessment & Plan:   Principal Problem:   Closed left hip fracture, initial encounter ([place]) Active Problems:   HYPERTENSION, BENIGN   Prolonged QT interval   Coronary Artery Disease   Depression with anxiety   Hypoxemic respiratory failure, chronic (HCC)   Hypokalemia   Left hip fracture status post mechanical fall -Orthopedics following, appreciate insight and recommendation -ORIF per surgery schedule - Hold ASA and Plavix, continue NPO and pain-control  - Difficulty with PT initially but improving daily, pain control seems to be her major hurdle at this point, attempted IV morphine today prior to PT in hopes that she would have improved session and increase mobility as tolerated.   Prolonged QT interval  - QTc is 561 --> 540 ms in setting of hypokalemia at intake - Continue cardiac monitoring, replace potassium, check magnesium, minimize QT-prolonging medications, repeat EKG shows sinus rhythm   Hypokalemia, resolved - Serum potassium 2.8 on admission - WNL today   Obstructive CAD history status post stent 2013 - No recent issues or events from a cardiac standpoint -Resume aspirin, Plavix, metoprolol  Depression, anxiety  - Continue SNRI and as-needed Xanax   Hypertension  - Continue metoprolol - Borderline hypotension in the setting of narcotics, follow closely while holding amlodipine  and HCTZ   DVT prophylaxis: SCDs  Code Status: Full   Status is: Inpatient  Dispo: The patient is from: Home              Anticipated d/c is to: To be determined              Anticipated d/c date is: 48 to 72 hours              Patient currently not medically stable for discharge  Consultants:  Orthopedic surgery  Procedures:  ORIF left hip per orthopedic surgery schedule  Antimicrobials:  None indicated  Subjective: No acute issues or events overnight, pain well controlled at rest but with any motion movement or manipulation she states the pain is quite unbearable.  Continue to encourage her to use narcotics as necessary, afraid she is avoiding narcotic use out of fear of dependency which we discussed should not be an issue with short-term pain control after surgery.  Objective: Vitals:   06/22/21 1817 06/22/21 2038 06/23/21 0536 06/23/21 0814  BP: 106/63 (!) 131/53 105/64 (!) 103/55  Pulse:  100 85 75  Resp: 20 18 16 14   Temp:  99.2 F (37.3 C)  98.3 F (36.8 C)  TempSrc:  Oral  Oral  SpO2: 92% 92% 99% 98%  Weight:      Height:        Intake/Output Summary (Last 24 hours) at 06/23/2021 0834 Last data filed at 06/22/2021 2200 Gross per 24 hour  Intake 171.03 ml  Output 300 ml  Net -128.97 ml    Filed Weights   06/19/21 2130  Weight: 68 kg    Examination:  General:  Pleasantly resting  in bed, No acute distress. HEENT:  Normocephalic atraumatic.  Sclerae nonicteric, noninjected.  Extraocular movements intact bilaterally. Neck:  Without mass or deformity.  Trachea is midline. Lungs:  Clear to auscultate bilaterally without rhonchi, wheeze, or rales. Heart:  Regular rate and rhythm.  Without murmurs, rubs, or gallops. Abdomen:  Soft, nontender, nondistended.  Without guarding or rebound. Extremities: Without cyanosis, clubbing, edema, or obvious deformity. Vascular:  Dorsalis pedis and posterior tibial pulses palpable bilaterally. Skin:  Warm and dry, no  erythema, no ulcerations.  Postop bandage clean dry intact.  Data Reviewed: I have personally reviewed following labs and imaging studies  CBC: Recent Labs  Lab 06/19/21 2210 06/20/21 0328 06/21/21 0153 06/22/21 0141  WBC 6.7 7.5 6.9 7.1  NEUTROABS 5.0  --   --   --   HGB 13.2 12.7 9.6* 8.2*  HCT 38.6 36.7 28.4* 24.1*  MCV 96.0 96.3 96.9 96.4  PLT 146* 128* 130* 140*    Basic Metabolic Panel: Recent Labs  Lab 06/19/21 2210 06/20/21 0328 06/21/21 0153 06/22/21 0141  NA 136 135 135 137  K 2.8* 3.4* 3.9 3.7  CL 96* 95* 96* 98  CO2 27 31 31  32  GLUCOSE 122* 150* 193* 138*  BUN 5* 6* 6* 9  CREATININE 0.62 0.66 0.66 0.66  CALCIUM 8.6* 8.6* 8.6* 8.4*  MG  --  2.3  --   --     GFR: Estimated Creatinine Clearance: 49.5 mL/min (by C-G formula based on SCr of 0.66 mg/dL). Liver Function Tests: No results for input(s): AST, ALT, ALKPHOS, BILITOT, PROT, ALBUMIN in the last 168 hours. No results for input(s): LIPASE, AMYLASE in the last 168 hours. No results for input(s): AMMONIA in the last 168 hours. Coagulation Profile: Recent Labs  Lab 06/19/21 2210  INR 1.0    Cardiac Enzymes: No results for input(s): CKTOTAL, CKMB, CKMBINDEX, TROPONINI in the last 168 hours. BNP (last 3 results) No results for input(s): PROBNP in the last 8760 hours. HbA1C: No results for input(s): HGBA1C in the last 72 hours. CBG: No results for input(s): GLUCAP in the last 168 hours. Lipid Profile: No results for input(s): CHOL, HDL, LDLCALC, TRIG, CHOLHDL, LDLDIRECT in the last 72 hours. Thyroid Function Tests: No results for input(s): TSH, T4TOTAL, FREET4, T3FREE, THYROIDAB in the last 72 hours. Anemia Panel: No results for input(s): VITAMINB12, FOLATE, FERRITIN, TIBC, IRON, RETICCTPCT in the last 72 hours. Sepsis Labs: No results for input(s): PROCALCITON, LATICACIDVEN in the last 168 hours.  Recent Results (from the past 240 hour(s))  Resp Panel by RT-PCR (Flu A&B, Covid)  Nasopharyngeal Swab     Status: None   Collection Time: 06/20/21 12:40 AM   Specimen: Nasopharyngeal Swab; Nasopharyngeal(NP) swabs in vial transport medium  Result Value Ref Range Status   SARS Coronavirus 2 by RT PCR NEGATIVE NEGATIVE Final    Comment: (NOTE) SARS-CoV-2 target nucleic acids are NOT DETECTED.  The SARS-CoV-2 RNA is generally detectable in upper respiratory specimens during the acute phase of infection. The lowest concentration of SARS-CoV-2 viral copies this assay can detect is 138 copies/mL. A negative result does not preclude SARS-Cov-2 infection and should not be used as the sole basis for treatment or other patient management decisions. A negative result may occur with  improper specimen collection/handling, submission of specimen other than nasopharyngeal swab, presence of viral mutation(s) within the areas targeted by this assay, and inadequate number of viral copies(<138 copies/mL). A negative result must be combined with clinical observations, patient history,  and epidemiological information. The expected result is Negative.  Fact Sheet for Patients:  EntrepreneurPulse.com.au  Fact Sheet for Healthcare Providers:  IncredibleEmployment.be  This test is no t yet approved or cleared by the [place] FDA and  has been authorized for detection and/or diagnosis of SARS-CoV-2 by FDA under an Emergency Use Authorization (EUA). This EUA will remain  in effect (meaning this test can be used) for the duration of the COVID-19 declaration under Section 564(b)(1) of the Act, 21 U.S.C.section 360bbb-3(b)(1), unless the authorization is terminated  or revoked sooner.       Influenza A by PCR NEGATIVE NEGATIVE Final   Influenza B by PCR NEGATIVE NEGATIVE Final    Comment: (NOTE) The Xpert Xpress SARS-CoV-2/FLU/RSV plus assay is intended as an aid in the diagnosis of influenza from Nasopharyngeal swab specimens and should not be  used as a sole basis for treatment. Nasal washings and aspirates are unacceptable for Xpert Xpress SARS-CoV-2/FLU/RSV testing.  Fact Sheet for Patients: EntrepreneurPulse.com.au  Fact Sheet for Healthcare Providers: IncredibleEmployment.be  This test is not yet approved or cleared by the [place] FDA and has been authorized for detection and/or diagnosis of SARS-CoV-2 by FDA under an Emergency Use Authorization (EUA). This EUA will remain in effect (meaning this test can be used) for the duration of the COVID-19 declaration under Section 564(b)(1) of the Act, 21 U.S.C. section 360bbb-3(b)(1), unless the authorization is terminated or revoked.  Performed at [facility], [place] [address], [place], [place] [address]   Surgical pcr screen     Status: None   Collection Time: 06/20/21  1:29 PM   Specimen: Nasal Mucosa; Nasal Swab  Result Value Ref Range Status   MRSA, PCR NEGATIVE NEGATIVE Final   Staphylococcus aureus NEGATIVE NEGATIVE Final    Comment: (NOTE) The Xpert SA Assay (FDA approved for NASAL specimens in patients 2 years of age and older), is one component of a comprehensive surveillance program. It is not intended to diagnose infection nor to guide or monitor treatment. Performed at [facility], [place] [address] [place], [place] [address]       Radiology Studies: No results found.   Scheduled Meds:  acetaminophen  650 mg Oral Q8H   aspirin EC  81 mg Oral Daily   clopidogrel  75 mg Oral Daily   docusate sodium  100 mg Oral BID   feeding supplement  237 mL Oral BID BM   metoprolol tartrate  25 mg Oral BID   multivitamin with minerals  1 tablet Oral Daily   pantoprazole  40 mg Oral Daily   rosuvastatin  20 mg Oral QHS   venlafaxine XR  75 mg Oral Q breakfast   Continuous Infusions:  methocarbamol (ROBAXIN) IV Stopped (06/20/21 1051)     LOS: 3 days   Time spent: 60min  Galilea Quito C Arelly Whittenberg, DO [facility]  If 7PM-7AM, please contact night-coverage [email or web address]  06/23/2021, 8:34 AM

## 2021-06-23 NOTE — Progress Notes (Signed)
Physical Therapy Treatment Patient Details Name: Carrie Chung MRN: [id number] DOB: 03-Jun-1941 Today's Date: 06/23/2021    History of Present Illness Pt is an 80 y/o female presenting on 6/30 after a mechanical fall at home with c/o L hip pain. Imaging revealed acute left femoral intertrochanteric fracture, now s/p IM nail on 7/1. PMH includes: CAD, atrial tachycardia, NSTEMI, HTN, HLD, and multiple stent placements.    PT Comments    Pt received in supine, agreeable to therapy session and with good participation and tolerance for seated/standing transfer training. Emphasis on safe hand placement with transfers, pursed-lip breathing, activity pacing, gradual progression of seated/standing activity within tolerance and LE HEP to work on between sessions, pt would like HEP handout next session for reinforcement if possible. Pt continues to benefit from PT services to progress toward functional mobility goals. Continue to recommend CIR, pt put forth good effort as able.   Follow Up Recommendations  CIR     Equipment Recommendations  Other (comment) (defer to next venue of care)    Recommendations for Other Services       Precautions / Restrictions Precautions Precautions: Fall Restrictions Weight Bearing Restrictions: Yes LLE Weight Bearing: Weight bearing as tolerated    Mobility  Bed Mobility Overal bed mobility: Needs Assistance Bed Mobility: Supine to Sit     Supine to sit: HOB elevated;Mod assist     General bed mobility comments: increased time and effort needed, use of bed rails, assistance needed for L LE movement off of bed and use of bed pads to scoot hips forwards towards EOB, pt with good initiation needs modA more for scooting hips to EOB than for trunk rise    Transfers Overall transfer level: Needs assistance Equipment used: Rolling walker (2 wheeled) Transfers: Sit to/from [facility] Sit to Stand: Mod assist;+2 physical assistance Stand  pivot transfers: Mod assist;+2 physical assistance;Max assist       General transfer comment: verbal and tactile cueing needed for safe hand placement and technique with RW, mod A x2 needed to power into a full upright standing position and modA progressing to maxA to pivot to [facility] towards her R side. She then performed a second sit<>stand from [facility] and needed recliner chair pulled behind her in standing as she was too fatigued to pivot from [facility].  Ambulation/Gait             General Gait Details: unable; ~2-3 pivotal steps prior to sitting impulsively; poor weight acceptance through LLE   Stairs             Wheelchair Mobility    Modified Rankin (Stroke Patients Only)       Balance Overall balance assessment: Needs assistance Sitting-balance support: Feet supported Sitting balance-Leahy Scale: Fair     Standing balance support: During functional activity;Bilateral upper extremity supported Standing balance-Leahy Scale: Poor Standing balance comment: reliant on RW and +2 mod/maxA                            Cognition Arousal/Alertness: Awake/alert Behavior During Therapy: Anxious Overall Cognitive Status: Within Functional Limits for tasks assessed                                 General Comments: pt impulsive to sit prior to reaching chair, anxious regarding mobility but participatory as able; 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      Exercises Other Exercises Other Exercises: ankle pumps x10 reps supine, AAROM hip abduction x10 reps (on R only, unable to tolerate on LLE this date), heel slides on RLE x10 reps ea; encouraged glute sets/quad sets in chair at end of session    General Comments General comments (skin integrity, edema, etc.): SpO2 98% on RA, HR 91 bpm with exertion; no dizziness reported this date      Pertinent Vitals/Pain Pain Assessment: Faces Faces Pain Scale: Hurts  even more Pain Location: L LE Pain Descriptors / Indicators: Grimacing;Guarding;Moaning Pain Intervention(s): Limited activity within patient's tolerance;Monitored during session;Premedicated before session;Repositioned    Home Living                      Prior Function            PT Goals (current goals can now be found in the care plan section) Acute Rehab PT Goals Patient Stated Goal: Return home after going to CIR PT Goal Formulation: With patient Time For Goal Achievement: 07/05/21 Progress towards PT goals: Progressing toward goals    Frequency    Min 4X/week      PT Plan Current plan remains appropriate    Co-evaluation              AM-PAC PT "6 Clicks" Mobility   Outcome Measure  Help needed turning from your back to your side while in a flat bed without using bedrails?: A Little Help needed moving from lying on your back to sitting on the side of a flat bed without using bedrails?: A Lot Help needed moving to and from a bed to a chair (including a wheelchair)?: A Lot Help needed standing up from a chair using your arms (e.g., wheelchair or bedside chair)?: A Lot Help needed to walk in hospital room?: Total Help needed climbing 3-5 steps with a railing? : Total 6 Click Score: 11    End of Session Equipment Utilized During Treatment: Gait belt Activity Tolerance: Patient limited by pain;Patient limited by fatigue Patient left: in chair;with call bell/phone within reach;with chair alarm set (heels floated) Nurse Communication: Mobility status PT Visit Diagnosis: Other abnormalities of gait and mobility (R26.89);Muscle weakness (generalized) (M62.81);Pain Pain - Right/Left: Left Pain - part of body: Leg     Time: 1202-1227 PT Time Calculation (min) (ACUTE ONLY): 25 min  Charges:  $Therapeutic Exercise: 8-22 mins $Therapeutic Activity: 8-22 mins                     Breccan Galant P., PTA Acute Rehabilitation Services Pager: [phone] Office:  [place] 06/23/2021, 4:08 PM

## 2021-06-23 NOTE — TOC Progression Note (Signed)
Transition of Care [facility]) - Progression Note    Patient Details  Name: Carrie Chung MRN: [id number] Date of Birth: 08-30-1941  Transition of [facility]) CM/SW Contact  Milinda Antis, [place] Phone Number: 06/23/2021, 10:15 AM  Clinical Narrative:    TOC following patient for any d/c planning needs once medically stable.  Current recommendation CIR.  Lind Covert, MSW, LCSWA         Expected Discharge Plan and Services                                                 Social Determinants of Health (SDOH) Interventions    Readmission Risk Interventions No flowsheet data found.

## 2021-06-23 NOTE — H&P (Signed)
Physical Medicine and Rehabilitation Admission H&P    Chief Complaint  Patient presents with   Fall  : HPI: Carrie Chung is an 80 year old right-handed female with history of obstructive CAD status post stenting maintained on aspirin and Plavix, atrial tachycardia, hypertension, depression/anxiety, nocturnal oxygen requirement and quit smoking 41 years ago.  Per chart review patient lives with spouse.  Independent prior to admission.  Multilevel home bed and bath upstairs 3 steps to entry.  Husband is limited physically to assist.  She does have a daughter in the area.  Presented 06/19/2021 after mechanical fall landing on left hip without loss of consciousness.  Cranial CT scan negative.  X-rays and imaging revealed acute left femoral intertrochanteric hip fracture.  Status post IM nailing 06/20/2021 per Dr. Percell Miller.  Weightbearing as tolerated left lower extremity.  Patient's chronic aspirin and Plavix has since been resumed postoperatively.  Acute blood loss anemia 8.8 and monitored.  Therapy evaluations completed due to patient's decreased functional mobility was admitted for a comprehensive rehab program. Currently complains of pain at fracture site.   Review of Systems  Constitutional:  Negative for chills and fever.  HENT:  Negative for hearing loss.   Eyes:  Negative for blurred vision, double vision and discharge.  Respiratory:  Positive for shortness of breath. Negative for cough.   Cardiovascular:  Positive for palpitations. Negative for chest pain and leg swelling.  Gastrointestinal:  Positive for constipation. Negative for heartburn, nausea and vomiting.  Genitourinary:  Negative for dysuria, flank pain and hematuria.  Musculoskeletal:  Positive for joint pain and myalgias.  Skin:  Negative for rash.  Psychiatric/Behavioral:  Positive for depression.        Anxiety  All other systems reviewed and are negative. Past Medical History:  Diagnosis Date   Atrial tachycardia  ([place])    a. noted at cardiac rehab 5/13; event monitor ordered to assess for AFib   CAD (coronary artery disease)    a. NSTEMI 03/2012 ([place] 03/27/12: pLAD 30%, mLAD 99%, mCFX 95%, mRCA 99% with thrombus, EF 65%);  s/p PTCA/DESx1 to prox-mid RCA 03/27/12 urgently in setting of hypotension/bradycardia, with staged PTCA/Evolve study stent to mid LAD & PTCA/Evolve study stent to prox LCx 03/29/12 ;   echo 03/28/12:EF 60%, Aortic sclerosis without AS, mild RVE, mild reduced RVSF     Endometrial polyp    HTN (hypertension)    Hyperlipidemia    MI, old    Sleep apnea    Past Surgical History:  Procedure Laterality Date   APPENDECTOMY  80 years old   CAROTID STENT  04/2012   x3   [place]   LEFT HEART CATHETERIZATION WITH CORONARY ANGIOGRAM N/A 03/27/2012   Procedure: LEFT HEART CATHETERIZATION WITH CORONARY ANGIOGRAM;  Surgeon: Burnell Blanks, MD;  Location[facility] C[facility] LAB;  Service: Cardiovascular;  Laterality: N/A;   PERCUTANEOUS CORONARY STENT INTERVENTION (PCI-S) N/A 03/27/2012   Procedure: PERCUTANEOUS CORONARY STENT INTERVENTION (PCI-S);  Surgeon: Burnell Blanks, MD;  Location: [facility] CATH LAB;  Service: Cardiovascular;  Laterality: N/A;   PERCUTANEOUS CORONARY STENT INTERVENTION (PCI-S) N/A 03/29/2012   Procedure: PERCUTANEOUS CORONARY STENT INTERVENTION (PCI-S);  Surgeon: [name], MD;  Location: [facility] CATH LAB;  Service: Cardiovascular;  Laterality: N/A;   TONSILLECTOMY  80 years old   Family History  Problem Relation Age of Onset   Coronary artery disease Mother        CABG at age 90, PPM  Colon cancer Father    Cancer - Other Father        larnyx   Social History:  reports that she quit smoking about 41 years ago. Her smoking use included cigarettes. She has never used smokeless tobacco. She reports current alcohol use of about 20.0 standard drinks of alcohol per week. She reports that she does not use drugs. Allergies:  Allergies  Allergen  Reactions   Penicillins Hives, Swelling and Other (See Comments)   Sulfonamide Derivatives Hives and Swelling   Lactose Intolerance (Gi) Diarrhea   Sulfamethoxazole Swelling   Medications Prior to Admission  Medication Sig Dispense Refill   acetaminophen (TYLENOL) 500 MG tablet Take 1,000 mg by mouth every 6 (six) hours as needed for moderate pain or headache.     ALPRAZolam (XANAX) 1 MG tablet TAKE 1 TABLET(1 MG) BY MOUTH AT BEDTIME AS NEEDED FOR SLEEP (Patient taking differently: Take 1 mg by mouth at bedtime as needed for sleep.) 90 tablet 2   amLODipine (NORVASC) 5 MG tablet TAKE 1 TABLET BY MOUTH EVERY DAY (Patient taking differently: Take 5 mg by mouth daily.) 90 tablet 3   aspirin 81 MG tablet Take 1 tablet (81 mg total) by mouth daily.     Calcium Carbonate-Vitamin D 600-400 MG-UNIT tablet Take 1 tablet by mouth every evening.     clopidogrel (PLAVIX) 75 MG tablet TAKE 1 TABLET(75 MG) BY MOUTH DAILY (Patient taking differently: Take 75 mg by mouth at bedtime.) 90 tablet 2   desvenlafaxine (PRISTIQ) 50 MG 24 hr tablet TAKE 1 TABLET(50 MG) BY MOUTH DAILY (Patient taking differently: Take 50 mg by mouth daily.) 90 tablet 3   FOLBIC 2.5-25-2 MG TABS tablet TAKE 1 TABLET BY MOUTH DAILY 90 tablet 3   hydrochlorothiazide (HYDRODIURIL) 25 MG tablet TAKE 1 TABLET BY MOUTH EVERY DAY (Patient taking differently: Take 25 mg by mouth daily.) 90 tablet 3   metoprolol tartrate (LOPRESSOR) 50 MG tablet TAKE 1 AND 1/2 TABLETS(75 MG) BY MOUTH TWICE DAILY (Patient taking differently: Take 75 mg by mouth 2 (two) times daily.) 270 tablet 2   nitroGLYCERIN (NITROSTAT) 0.4 MG SL tablet Place 1 tablet (0.4 mg total) under the tongue every 5 (five) minutes as needed for chest pain. 25 tablet 3   OXYGEN Inhale into the lungs. Per patient using 2 liters at night.     rosuvastatin (CRESTOR) 20 MG tablet TAKE 1 TABLET(20 MG) BY MOUTH DAILY (Patient taking differently: Take 20 mg by mouth at bedtime.) 90 tablet 3    esomeprazole (NEXIUM) 40 MG capsule Take 40 mg by mouth daily at 12 noon.     HYDROcodone-homatropine (HYCODAN) 5-1.5 MG/5ML syrup Take 5 mLs by mouth every 8 (eight) hours as needed for cough. (Patient not taking: No sig reported) 120 mL 0   triamcinolone cream (KENALOG) 0.1 % Apply 1 application topically 3 (three) times daily. (Patient not taking: No sig reported) 60 g 0    Drug Regimen Review Drug regimen was reviewed and remains appropriate with no significant issues identified  Home: Home Living Family/patient expects to be discharged to:: Private residence Living Arrangements: Spouse/significant other Available Help at Discharge: Family, Available 24 hours/day Type of Home: House Home Access: Stairs to enter [employer] of Steps: 3 in front, 4 in garage Entrance Stairs-Rails: None Home Layout: Two level, Bed/bath upstairs, Other (Comment) (plan to make arrangements for pt to sleep downstairs once home) Alternate Level Stairs-Number of Steps: 14 Alternate Level Stairs-Rails: Right Bathroom Shower/Tub: Tub/shower  unit Bathroom Toilet: Standard Bathroom Accessibility: Yes Home Equipment: Crutches, Cane - single point, Hand held shower head Additional Comments: caregiver to her husband who has balance impairments and early stage of dementia  Lives With: Spouse   Functional History: Prior Function Level of Independence: Independent Comments: pt completely independent prior, managing home, taking care of husband who has balance issues and beginning stages of dementia  Functional Status:  Mobility: Bed Mobility Overal bed mobility: Needs Assistance Bed Mobility: Supine to Sit Supine to sit: HOB elevated, Mod assist Sit to supine: Max assist, HOB elevated General bed mobility comments: increased time and effort needed, use of bed rails, assistance needed for L LE movement off of bed and use of bed pads to scoot hips forwards towards EOB Transfers Overall transfer  level: Needs assistance Equipment used: Rolling walker (2 wheeled) Transfers: Sit to/from Stand, [employer] Transfers Sit to Stand: Mod assist, +2 physical assistance Stand pivot transfers: Mod assist, +2 physical assistance  Lateral/Scoot Transfers: Mod assist General transfer comment: verbal and tactile cueing needed for safe hand placement and technique with RW, mod A x2 needed to power into a full upright standing position and to pivot to [facility] towards her R side. She then performed a second sit<>stand from [facility] and pivoted to the recliner chair on her R with mod A x2 Ambulation/Gait General Gait Details: unable    ADL: ADL Overall ADL's : Needs assistance/impaired Eating/Feeding: Independent, Sitting Grooming: Wash/dry hands, Wash/dry face, Applying deodorant, Oral care, Set up, Sitting Upper Body Bathing: Set up, Sitting Lower Body Bathing: Maximal assistance, +2 for physical assistance, Sit to/from stand Upper Body Dressing : Set up, Sitting Lower Body Dressing: Maximal assistance, +2 for physical assistance, Sit to/from stand Toilet Transfer: Maximal assistance, +2 for physical assistance, +2 for safety/equipment, BSC, RW, Stand-pivot Toileting- Clothing Manipulation and Hygiene: Maximal assistance, +2 for physical assistance, +2 for safety/equipment Toileting - Clothing Manipulation Details (indicate cue type and reason): After BM - pt required totat A for hygiene in standing, BUE reliant on ecxternal support in standing Functional mobility during ADLs: Moderate assistance, +2 for physical assistance, +2 for safety/equipment, Rolling walker, Cueing for safety, Cueing for sequencing General ADL Comments: pt would benefit from AE training to increase indep in lower body ADLs  Cognition: Cognition Overall Cognitive Status: Within Functional Limits for tasks assessed Orientation Level: Oriented X4 Cognition Arousal/Alertness: Awake/alert Behavior During Therapy: Anxious Overall  Cognitive Status: Within Functional Limits for tasks assessed  Physical Exam: Blood pressure 105/64, pulse 85, temperature 99.2 F (37.3 C), temperature source Oral, resp. rate 16, height 5\' 1"  (1.549 m), weight 68 kg, SpO2 99 %. Physical Exam Gen: no distress, normal appearing HEENT: oral mucosa pink and moist, NCAT Cardio: Reg rate Chest: normal effort, normal rate of breathing Abd: soft, non-distended Ext: no edema Psych: pleasant, normal affect, anxious about her pain Skin:    Comments: Hip incision is dressed.  Appropriately tender.  Neurological:     Comments: Patient is alert.  No acute distress.  Oriented x3 and follows commands. Strength intact except pain limited in LLE.  Results for orders placed or performed during the hospital encounter of 06/19/21 (from the past 48 hour(s))  CBC     Status: Abnormal   Collection Time: 06/22/21  1:41 AM  Result Value Ref Range   WBC 7.1 4.0 - 10.5 K/uL   RBC 2.50 (L) 3.87 - 5.11 MIL/uL   Hemoglobin 8.2 (L) 12.0 - 15.0 g/dL   HCT 24.1 (L)  36.0 - 46.0 %   MCV 96.4 80.0 - 100.0 fL   MCH 32.8 26.0 - 34.0 pg   MCHC 34.0 30.0 - 36.0 g/dL   RDW 13.3 11.5 - 15.5 %   Platelets 140 (L) 150 - 400 K/uL   nRBC 0.0 0.0 - 0.2 %    Comment: Performed at [facility] [address] [place], [place] [address]  Basic metabolic panel     Status: Abnormal   Collection Time: 06/22/21  1:41 AM  Result Value Ref Range   Sodium 137 135 - 145 mmol/L   Potassium 3.7 3.5 - 5.1 mmol/L   Chloride 98 98 - 111 mmol/L   CO2 32 22 - 32 mmol/L   Glucose, Bld 138 (H) 70 - 99 mg/dL    Comment: Glucose reference range applies only to samples taken after fasting for at least 8 hours.   BUN 9 8 - 23 mg/dL   Creatinine, Ser 0.66 0.44 - 1.00 mg/dL   Calcium 8.4 (L) 8.9 - 10.3 mg/dL   GFR, Estimated >60 >60 mL/min    Comment: (NOTE) Calculated using the CKD-EPI Creatinine Equation (2021)    Anion gap 7 5 - 15    Comment: Performed at [facility] [address], [place], [place] [address]   *Note: Due to a large number of results and/or encounters for the requested time period, some results have not been displayed. A complete set of results can be found in Results Review.   No results found.     Medical Problem List and Plan: 1.   Debility secondary to left femoral intertrochanteric hip fracture.  Status post IM nailing 06/20/2021.  Weightbearing as tolerated  -patient may shower but incision must be covered  -ELOS/Goals: 2-3 weeks modI  -Admit to CIR 2.  Antithrombotics: -DVT/anticoagulation: SCDs.  Check vascular study  -antiplatelet therapy: Aspirin 81 mg daily and Plavix 75 mg daily 3. Pain Management: Hydrocodone and Robaxin as needed. History of negative side effects to gabapentin.  4. Mood: Effexor 75 mg daily, Xanax 1 mg nightly as needed  -antipsychotic agents: N/A 5. Neuropsych: This patient is capable of making decisions on her own behalf. 6. Skin/Wound Care: Routine skin checks 7. Fluids/Electrolytes/Nutrition: Routine in and outs with follow-up chemistries 8.  Acute blood loss anemia.  Follow-up CBC 9.  CAD with history of multiple stents.  Continue aspirin Plavix 10.  Hyperlipidemia.  Continue Crestor 11.  Nocturnal oxygen requirements.  Continue oxygen as prior to admission 12.  Hypertension.  Well controlled, continue Lopressor 25 mg twice daily.  Monitor with increased mobility 13.  Constipation.  Laxative assistance as directed  I have personally performed a face to face diagnostic evaluation, including, but not limited to relevant history and physical exam findings, of this patient and developed relevant assessment and plan.  Additionally, I have reviewed and concur with the physician assistant's documentation above.  Leeroy Cha, MD  Lavon Paganini [place], PA-C 06/23/2021

## 2021-06-23 NOTE — Progress Notes (Signed)
Inpatient Rehab Admissions Coordinator:  Saw pt at bedside. She is not medically ready for possible CIR admission. Will continue to follow.   Gayland Curry, [place], [place] Admissions Coordinator [phone]

## 2021-06-23 NOTE — Progress Notes (Signed)
Orthopaedic Trauma Service Progress Note  Patient ID: Carrie Chung MRN: [id number] DOB/AGE: 1940/12/25 80 y.o.  Subjective:  Doing ok Pain marginally better States the norco only lasting 1.5 hours Has done better with oxycodone in past   Hopeful for CIR   ROS As above  Objective:   VITALS:   Vitals:   06/22/21 1817 06/22/21 2038 06/23/21 0536 06/23/21 0814  BP: 106/63 (!) 131/53 105/64 (!) 103/55  Pulse:  100 85 75  Resp: 20 18 16 14   Temp:  99.2 F (37.3 C)  98.3 F (36.8 C)  TempSrc:  Oral  Oral  SpO2: 92% 92% 99% 98%  Weight:      Height:        Estimated body mass index is 28.34 kg/m as calculated from the following:   Height as of this encounter: 5\' 1"  (1.549 m).   Weight as of this encounter: 68 kg.   Intake/Output      07/03 0701 07/04 0700 07/04 0701 07/05 0700   P.O. 360    IV Piggyback 51    Total Intake(mL/kg) 411 (6)    Urine (mL/kg/hr) 300 (0.2)    Total Output 300    Net +111         Urine Occurrence 2 x    Stool Occurrence 2 x      LABS  No results found. However, due to the size of the patient record, not all encounters were searched. Please check Results Review for a complete set of results.   PHYSICAL EXAM:   Gen: Pleasant, NAD, sitting upright in bed, family at bedside  Lungs: Unlabored Cardiac: s1 and s2 Ext:       left lower extremity             Dressings are clean, dry and intact to the left thigh             Extremity is warm             + DP pulse             No DCT                        Compartments are soft             Distal motor and sensory functions are intact  Assessment/Plan: 3 Days Post-Op   Principal Problem:   Closed left hip fracture, initial encounter ([place]) Active Problems:   HYPERTENSION, BENIGN   Prolonged QT interval   Coronary Artery Disease   Depression with anxiety   Hypoxemic respiratory failure, chronic  (HCC)   Hypokalemia   Anti-infectives (From admission, onward)    Start     Dose/Rate Route Frequency Ordered Stop   06/21/21 0600  vancomycin (VANCOREADY) IVPB 1250 mg/250 mL  Status:  Discontinued        1,250 mg 166.7 mL/hr over 90 Minutes Intravenous On call to O.R. 06/20/21 1240 06/20/21 1611   06/20/21 1715  ceFAZolin (ANCEF) IVPB 2g/100 mL premix        2 g 200 mL/hr over 30 Minutes Intravenous Every 6 hours 06/20/21 1617 06/21/21 0107   06/20/21 1246  vancomycin (VANCOCIN) 1-5 GM/200ML-% IVPB       Note to Pharmacy: [name]   :  cabinet override      06/20/21 1246 06/20/21 1327     .  POD/HD#: 66  80 year old female with left hip fracture s/p IMN L hip     Weightbearing: WBAT LLE Insicional and dressing care: Daily dressing changes with Mepilex or 4 x 4's and tape Orthopedic device(s):  Walker Showering: Okay to shower and clean wounds with soap and water VTE prophylaxis: Patient has been restarted on her preadmission antiplatelet therapy per protocol (Plavix and low-dose aspirin), scds Pain control: Multimodal: We will schedule Tylenol, dc norco, start oxy IR  Follow - up plan: 2-week follow-up with Dr. Percell Miller   Dispo: Continue with therapies, inpatient rehab following.  Optimize pain control.  Minimize IV narcotics.  Encourage oral pain medication (nonnarcotic followed by opioid and then IV medication if absolutely needed)  Jari Pigg, PA-C [phone] (C) 06/23/2021, 12:38 PM  [facility] [place] [phone] Jenetta [phone] (F)    After 5pm and on the weekends please log on to Amion, go to orthopaedics and the look under the [facility] Call for the provider(s) on call. You can also call our office at [phone] and then follow the prompts to be connected to the call team.

## 2021-06-24 ENCOUNTER — Encounter (HOSPITAL_COMMUNITY): Payer: Self-pay | Admitting: Orthopedic Surgery

## 2021-06-24 ENCOUNTER — Other Ambulatory Visit: Payer: Self-pay

## 2021-06-24 ENCOUNTER — Inpatient Hospital Stay (HOSPITAL_COMMUNITY)
Admission: RE | Admit: 2021-06-24 | Discharge: 2021-07-11 | DRG: 560 | Disposition: A | Discharge status: Home Health | Payer: Medicare Other | Source: Intra-hospital | Attending: Physical Medicine and Rehabilitation | Admitting: Physical Medicine and Rehabilitation

## 2021-06-24 DIAGNOSIS — M7989 Other specified soft tissue disorders: Secondary | ICD-10-CM | POA: Diagnosis not present

## 2021-06-24 DIAGNOSIS — F419 Anxiety disorder, unspecified: Secondary | ICD-10-CM | POA: Diagnosis present

## 2021-06-24 DIAGNOSIS — I10 Essential (primary) hypertension: Secondary | ICD-10-CM | POA: Diagnosis present

## 2021-06-24 DIAGNOSIS — F32A Depression, unspecified: Secondary | ICD-10-CM | POA: Diagnosis present

## 2021-06-24 DIAGNOSIS — Z9049 Acquired absence of other specified parts of digestive tract: Secondary | ICD-10-CM

## 2021-06-24 DIAGNOSIS — E876 Hypokalemia: Secondary | ICD-10-CM | POA: Diagnosis not present

## 2021-06-24 DIAGNOSIS — G8918 Other acute postprocedural pain: Secondary | ICD-10-CM | POA: Diagnosis present

## 2021-06-24 DIAGNOSIS — S72142D Displaced intertrochanteric fracture of left femur, subsequent encounter for closed fracture with routine healing: Secondary | ICD-10-CM | POA: Diagnosis not present

## 2021-06-24 DIAGNOSIS — Z880 Allergy status to penicillin: Secondary | ICD-10-CM | POA: Diagnosis not present

## 2021-06-24 DIAGNOSIS — I252 Old myocardial infarction: Secondary | ICD-10-CM

## 2021-06-24 DIAGNOSIS — E785 Hyperlipidemia, unspecified: Secondary | ICD-10-CM | POA: Diagnosis present

## 2021-06-24 DIAGNOSIS — K6389 Other specified diseases of intestine: Secondary | ICD-10-CM | POA: Diagnosis not present

## 2021-06-24 DIAGNOSIS — D62 Acute posthemorrhagic anemia: Secondary | ICD-10-CM | POA: Diagnosis present

## 2021-06-24 DIAGNOSIS — K5900 Constipation, unspecified: Secondary | ICD-10-CM | POA: Diagnosis present

## 2021-06-24 DIAGNOSIS — G4730 Sleep apnea, unspecified: Secondary | ICD-10-CM | POA: Diagnosis present

## 2021-06-24 DIAGNOSIS — Z882 Allergy status to sulfonamides status: Secondary | ICD-10-CM

## 2021-06-24 DIAGNOSIS — Z9981 Dependence on supplemental oxygen: Secondary | ICD-10-CM | POA: Diagnosis not present

## 2021-06-24 DIAGNOSIS — R42 Dizziness and giddiness: Secondary | ICD-10-CM | POA: Diagnosis present

## 2021-06-24 DIAGNOSIS — W1830XD Fall on same level, unspecified, subsequent encounter: Secondary | ICD-10-CM | POA: Diagnosis not present

## 2021-06-24 DIAGNOSIS — Z955 Presence of coronary angioplasty implant and graft: Secondary | ICD-10-CM

## 2021-06-24 DIAGNOSIS — R112 Nausea with vomiting, unspecified: Secondary | ICD-10-CM | POA: Diagnosis not present

## 2021-06-24 DIAGNOSIS — K219 Gastro-esophageal reflux disease without esophagitis: Secondary | ICD-10-CM | POA: Diagnosis present

## 2021-06-24 DIAGNOSIS — Z7902 Long term (current) use of antithrombotics/antiplatelets: Secondary | ICD-10-CM

## 2021-06-24 DIAGNOSIS — Z7982 Long term (current) use of aspirin: Secondary | ICD-10-CM

## 2021-06-24 DIAGNOSIS — S72002A Fracture of unspecified part of neck of left femur, initial encounter for closed fracture: Secondary | ICD-10-CM

## 2021-06-24 DIAGNOSIS — Z87891 Personal history of nicotine dependence: Secondary | ICD-10-CM

## 2021-06-24 DIAGNOSIS — R509 Fever, unspecified: Secondary | ICD-10-CM | POA: Diagnosis not present

## 2021-06-24 DIAGNOSIS — Z888 Allergy status to other drugs, medicaments and biological substances status: Secondary | ICD-10-CM

## 2021-06-24 DIAGNOSIS — M47816 Spondylosis without myelopathy or radiculopathy, lumbar region: Secondary | ICD-10-CM | POA: Diagnosis not present

## 2021-06-24 DIAGNOSIS — R110 Nausea: Secondary | ICD-10-CM | POA: Diagnosis not present

## 2021-06-24 DIAGNOSIS — I2510 Atherosclerotic heart disease of native coronary artery without angina pectoris: Secondary | ICD-10-CM | POA: Diagnosis present

## 2021-06-24 DIAGNOSIS — Z8249 Family history of ischemic heart disease and other diseases of the circulatory system: Secondary | ICD-10-CM | POA: Diagnosis not present

## 2021-06-24 DIAGNOSIS — R6883 Chills (without fever): Secondary | ICD-10-CM | POA: Diagnosis not present

## 2021-06-24 DIAGNOSIS — R5381 Other malaise: Secondary | ICD-10-CM | POA: Diagnosis present

## 2021-06-24 DIAGNOSIS — R600 Localized edema: Secondary | ICD-10-CM | POA: Diagnosis not present

## 2021-06-24 DIAGNOSIS — G4700 Insomnia, unspecified: Secondary | ICD-10-CM | POA: Diagnosis present

## 2021-06-24 DIAGNOSIS — Z91011 Allergy to milk products: Secondary | ICD-10-CM

## 2021-06-24 DIAGNOSIS — S72142A Displaced intertrochanteric fracture of left femur, initial encounter for closed fracture: Secondary | ICD-10-CM | POA: Diagnosis present

## 2021-06-24 DIAGNOSIS — Z79899 Other long term (current) drug therapy: Secondary | ICD-10-CM

## 2021-06-24 MED ORDER — METHOCARBAMOL 500 MG PO TABS: 500.0000 mg | ORAL_TABLET | Freq: Four times a day (QID) | ORAL | 0 refills | Status: DC | PRN

## 2021-06-24 MED ORDER — ASPIRIN EC 81 MG PO TBEC
81.0000 mg | DELAYED_RELEASE_TABLET | Freq: Every day | ORAL | Status: DC
  Administered 2021-06-25 – 2021-07-11 (×17): 81 mg via ORAL
  Filled 2021-06-24 (×17): qty 1

## 2021-06-24 MED ORDER — VENLAFAXINE HCL ER 75 MG PO CP24
75.0000 mg | ORAL_CAPSULE | Freq: Every day | ORAL | Status: DC
  Administered 2021-06-25 – 2021-07-11 (×17): 75 mg via ORAL
  Filled 2021-06-24 (×17): qty 1

## 2021-06-24 MED ORDER — PANTOPRAZOLE SODIUM 40 MG PO TBEC
40.0000 mg | DELAYED_RELEASE_TABLET | Freq: Every day | ORAL | Status: DC
  Administered 2021-06-25 – 2021-07-11 (×17): 40 mg via ORAL
  Filled 2021-06-24 (×17): qty 1

## 2021-06-24 MED ORDER — OXYCODONE HCL 5 MG PO TABS: 5.0000 mg | ORAL_TABLET | ORAL | 0 refills | Status: DC | PRN

## 2021-06-24 MED ORDER — ROSUVASTATIN CALCIUM 20 MG PO TABS
20.0000 mg | ORAL_TABLET | Freq: Every day | ORAL | Status: DC
  Administered 2021-06-24 – 2021-07-10 (×17): 20 mg via ORAL
  Filled 2021-06-24 (×17): qty 1

## 2021-06-24 MED ORDER — POLYETHYLENE GLYCOL 3350 17 G PO PACK: 17.0000 g | PACK | Freq: Every day | ORAL | 0 refills | Status: DC | PRN

## 2021-06-24 MED ORDER — CLOPIDOGREL BISULFATE 75 MG PO TABS
75.0000 mg | ORAL_TABLET | Freq: Every day | ORAL | Status: DC
  Administered 2021-06-25 – 2021-07-11 (×17): 75 mg via ORAL
  Filled 2021-06-24 (×17): qty 1

## 2021-06-24 MED ORDER — METHOCARBAMOL 500 MG PO TABS
500.0000 mg | ORAL_TABLET | Freq: Four times a day (QID) | ORAL | Status: DC | PRN
  Filled 2021-06-24: qty 1

## 2021-06-24 MED ORDER — DOCUSATE SODIUM 100 MG PO CAPS
100.0000 mg | ORAL_CAPSULE | Freq: Two times a day (BID) | ORAL | Status: DC
  Administered 2021-06-24 – 2021-07-11 (×34): 100 mg via ORAL
  Filled 2021-06-24 (×34): qty 1

## 2021-06-24 MED ORDER — METHOCARBAMOL 500 MG PO TABS: 500.0000 mg | ORAL_TABLET | Freq: Four times a day (QID) | ORAL | Status: DC | PRN

## 2021-06-24 MED ORDER — SENNOSIDES-DOCUSATE SODIUM 8.6-50 MG PO TABS: 1.0000 | ORAL_TABLET | Freq: Every evening | ORAL | Status: DC | PRN

## 2021-06-24 MED ORDER — METOPROLOL TARTRATE 25 MG PO TABS
25.0000 mg | ORAL_TABLET | Freq: Two times a day (BID) | ORAL | 1 refills | Status: DC
Start: 2021-06-24 — End: 2021-07-11

## 2021-06-24 MED ORDER — ALPRAZOLAM 0.5 MG PO TABS
1.0000 mg | ORAL_TABLET | Freq: Every evening | ORAL | Status: DC | PRN
  Administered 2021-06-24: 1 mg via ORAL
  Filled 2021-06-24: qty 2

## 2021-06-24 MED ORDER — ADULT MULTIVITAMIN W/MINERALS CH
1.0000 | ORAL_TABLET | Freq: Every day | ORAL | Status: DC
  Administered 2021-06-25 – 2021-07-11 (×16): 1 via ORAL
  Filled 2021-06-24 (×17): qty 1

## 2021-06-24 MED ORDER — ACETAMINOPHEN 325 MG PO TABS
325.0000 mg | ORAL_TABLET | ORAL | Status: DC | PRN
  Administered 2021-06-25 – 2021-07-11 (×6): 650 mg via ORAL
  Filled 2021-06-24 (×6): qty 2

## 2021-06-24 MED ORDER — ONDANSETRON HCL 4 MG PO TABS
4.0000 mg | ORAL_TABLET | Freq: Four times a day (QID) | ORAL | Status: DC | PRN
  Filled 2021-06-24: qty 1

## 2021-06-24 MED ORDER — BISACODYL 10 MG RE SUPP: 10.0000 mg | Freq: Every day | RECTAL | Status: DC | PRN

## 2021-06-24 MED ORDER — BOOST / RESOURCE BREEZE PO LIQD CUSTOM
1.0000 | Freq: Three times a day (TID) | ORAL | Status: DC
  Administered 2021-06-25 – 2021-07-09 (×8): 1 via ORAL

## 2021-06-24 MED ORDER — ENSURE ENLIVE PO LIQD
237.0000 mL | Freq: Two times a day (BID) | ORAL | Status: DC
  Administered 2021-06-25: 237 mL via ORAL

## 2021-06-24 MED ORDER — OXYCODONE HCL 5 MG PO TABS
5.0000 mg | ORAL_TABLET | ORAL | Status: DC | PRN
  Administered 2021-06-24 – 2021-06-28 (×9): 10 mg via ORAL
  Administered 2021-06-29: 5 mg via ORAL
  Administered 2021-06-29 – 2021-07-07 (×21): 10 mg via ORAL
  Filled 2021-06-24 (×7): qty 2
  Filled 2021-06-24 (×2): qty 1
  Filled 2021-06-24 (×23): qty 2

## 2021-06-24 MED ORDER — ONDANSETRON HCL 4 MG/2ML IJ SOLN: 4.0000 mg | Freq: Four times a day (QID) | INTRAMUSCULAR | Status: DC | PRN

## 2021-06-24 MED ORDER — METOPROLOL TARTRATE 25 MG PO TABS
25.0000 mg | ORAL_TABLET | Freq: Two times a day (BID) | ORAL | Status: DC
  Administered 2021-06-24 – 2021-06-28 (×8): 25 mg via ORAL
  Filled 2021-06-24 (×8): qty 1

## 2021-06-24 MED ORDER — ADULT MULTIVITAMIN W/MINERALS CH: 1.0000 | ORAL_TABLET | Freq: Every day | ORAL | Status: DC

## 2021-06-24 MED ORDER — POLYETHYLENE GLYCOL 3350 17 G PO PACK: 17.0000 g | PACK | Freq: Every day | ORAL | Status: DC | PRN

## 2021-06-24 NOTE — Plan of Care (Signed)
  Problem: Activity: Goal: Risk for activity intolerance will decrease Outcome: Progressing   Problem: Pain Managment: Goal: General experience of comfort will improve Outcome: Progressing   

## 2021-06-24 NOTE — Plan of Care (Signed)

## 2021-06-24 NOTE — Op Note (Addendum)
DATE OF SURGERY:  06/20/21    PATIENT NAME:  Carrie Chung  AGE: 80 y.o.  PRE-OPERATIVE DIAGNOSIS:  Left hip intertrochanteric fracture.  POST-OPERATIVE DIAGNOSIS:  SAME  PROCEDURE:  INTRAMEDULLARY (IM) NAIL INTERTROCHANTRIC  SURGEON:  Renette Butters  ASSISTANT:  Aggie Moats, PA-C, he was present and scrubbed throughout the case, critical for completion in a timely fashion, and for retraction, instrumentation, and closure.   OPERATIVE IMPLANTS: [employer] Gamma Nail  PREOPERATIVE INDICATIONS:  Carrie Chung is a 80 y.o. year old who fell and suffered a hip fracture. She was brought into the ER and then admitted and optimized and then elected for surgical intervention.    The risks benefits and alternatives were discussed with the patient including but not limited to the risks of nonoperative treatment, versus surgical intervention including infection, bleeding, nerve injury, malunion, nonunion, hardware prominence, hardware failure, need for hardware removal, blood clots, cardiopulmonary complications, morbidity, mortality, among others, and they were willing to proceed.    OPERATIVE PROCEDURE:  The patient was brought to the operating room and placed in the supine position. General anesthesia was administered. She was placed on the fracture table.  Closed reduction was performed under C-arm guidance. Time out was then performed after sterile prep and drape. She received preoperative antibiotics.  Incision was made proximal to the greater trochanter. A guidewire was placed in the appropriate position. Confirmation was made on AP and lateral views. The above-named nail was opened. I opened the proximal femur with a reamer. I then placed the nail by hand easily down. I did not need to ream the femur.  Once the nail was completely seated, I placed a guidepin into the femoral head into the center center position. I measured the length, and then reamed the lateral cortex and up into  the head. I then placed the lag screw. Slight compression was applied. Anatomic fixation achieved. Bone quality was mediocre.  I then secured the proximal interlocking bolt, and took off a half a turn, and then removed the instruments, and took final C-arm pictures AP and lateral the entire length of the leg.   Anatomic reconstruction was achieved, and the wounds were irrigated copiously and closed with Vicryl followed by staples and sterile gauze for the skin. The patient was awakened and returned to PACU in stable and satisfactory condition. There no complications and the patient tolerated the procedure well.  She will be weightbearing as tolerated, and will be on chemical px  for a period of four weeks after discharge.   Edmonia Lynch, M.D.

## 2021-06-24 NOTE — Discharge Summary (Signed)
Physician Discharge Summary  Carrie Chung WER:[id number] DOB: 21-Sep-1941 DOA: 06/19/2021  PCP: [name], MD  Admit date: 06/19/2021 Discharge date: 06/24/2021  Admitted From: Home Disposition: CIR  Recommendations for Outpatient Follow-up:  Follow up with PCP in 1-2 weeks Please obtain BMP/CBC in one week Please follow up with orthopedic surgery as scheduled  Discharge Condition: Stable CODE STATUS: Full Diet recommendation: As tolerated  Brief/Interim Summary: Carrie Chung is a 80 y.o. female with medical history significant for coronary artery disease, atrial tachycardia, hypertension, nocturnal oxygen requirement, depression, and anxiety, now presenting to the emergency department for evaluation of left hip pain after a fall.  CT of the left hip demonstrates comminuted impacted acute left femoral intertrochanteric fracture.  Orthopedic surgery was consulted by the ED physician and hospitalists asked to admit.  Patient admitted as above with acute mechanical fall with left femoral neck fracture status post ORIF, tolerated procedure quite well, continues to work with physical therapy currently evaluated and accepted to inpatient rehab here for ongoing treatment and evaluation.  Patient's other chronic comorbid conditions appear to be stable, pain currently well controlled on oxycodone which will be continued at discharge, continues to have unsafe ambulation postoperatively, appreciate PT recommendations for inpatient rehab.  Orthopedic surgery following, outpatient follow-up with bandage change at their discretion at next office visit.  Patient otherwise stable and agreeable for discharge to inpatient rehab.  Discharge Diagnoses:  Principal Problem:   Closed left hip fracture, initial encounter ([place]) Active Problems:   HYPERTENSION, BENIGN   Prolonged QT interval   Coronary Artery Disease   Depression with anxiety   Hypoxemic respiratory failure, chronic (HCC)    Hypokalemia    Discharge Instructions  Discharge Instructions     Call MD for:  difficulty breathing, headache or visual disturbances   Complete by: As directed    Call MD for:  persistant nausea and vomiting   Complete by: As directed    Call MD for:  redness, tenderness, or signs of infection (pain, swelling, redness, odor or green/yellow discharge around incision site)   Complete by: As directed    Call MD for:  severe uncontrolled pain   Complete by: As directed    Call MD for:  temperature >100.4   Complete by: As directed    Diet - low sodium heart healthy   Complete by: As directed    Increase activity slowly   Complete by: As directed    Leave dressing on - Keep it clean, dry, and intact until clinic visit   Complete by: As directed       Allergies as of 06/24/2021       Reactions   Penicillins Hives, Swelling, Other (See Comments)   Sulfonamide Derivatives Hives, Swelling   Lactose Intolerance (gi) Diarrhea   Sulfamethoxazole Swelling        Medication List     STOP taking these medications    amLODipine 5 MG tablet Commonly known as: NORVASC   hydrochlorothiazide 25 MG tablet Commonly known as: HYDRODIURIL   triamcinolone cream 0.1 % Commonly known as: KENALOG       TAKE these medications    acetaminophen 500 MG tablet Commonly known as: TYLENOL Take 1,000 mg by mouth every 6 (six) hours as needed for moderate pain or headache.   ALPRAZolam 1 MG tablet Commonly known as: XANAX TAKE 1 TABLET(1 MG) BY MOUTH AT BEDTIME AS NEEDED FOR SLEEP What changed:  how much to take how to take this  when to take this reasons to take this additional instructions   aspirin 81 MG tablet Take 1 tablet (81 mg total) by mouth daily.   Calcium Carbonate-Vitamin D 600-400 MG-UNIT tablet Take 1 tablet by mouth every evening.   clopidogrel 75 MG tablet Commonly known as: PLAVIX TAKE 1 TABLET(75 MG) BY MOUTH DAILY What changed: See the new instructions.    desvenlafaxine 50 MG 24 hr tablet Commonly known as: PRISTIQ TAKE 1 TABLET(50 MG) BY MOUTH DAILY What changed:  how much to take how to take this when to take this additional instructions   esomeprazole 40 MG capsule Commonly known as: NEXIUM Take 40 mg by mouth daily at 12 noon.   Folbic 2.5-25-2 MG Tabs tablet Generic drug: folic acid-pyridoxine-cyancobalamin TAKE 1 TABLET BY MOUTH DAILY   HYDROcodone-homatropine 5-1.5 MG/5ML syrup Commonly known as: HYCODAN Take 5 mLs by mouth every 8 (eight) hours as needed for cough.   methocarbamol 500 MG tablet Commonly known as: ROBAXIN Take 1 tablet (500 mg total) by mouth every 6 (six) hours as needed for muscle spasms.   metoprolol tartrate 25 MG tablet Commonly known as: LOPRESSOR Take 1 tablet (25 mg total) by mouth 2 (two) times daily. What changed:  medication strength how much to take how to take this when to take this additional instructions   nitroGLYCERIN 0.4 MG SL tablet Commonly known as: NITROSTAT Place 1 tablet (0.4 mg total) under the tongue every 5 (five) minutes as needed for chest pain.   oxyCODONE 5 MG immediate release tablet Commonly known as: Oxy IR/ROXICODONE Take 1-2 tablets (5-10 mg total) by mouth every 4 (four) hours as needed for severe pain or moderate pain.   OXYGEN Inhale into the lungs. Per patient using 2 liters at night.   polyethylene glycol 17 g packet Commonly known as: MIRALAX / GLYCOLAX Take 17 g by mouth daily as needed for mild constipation.   rosuvastatin 20 MG tablet Commonly known as: CRESTOR TAKE 1 TABLET(20 MG) BY MOUTH DAILY What changed: See the new instructions.               Discharge Care Instructions  (From admission, onward)           Start     Ordered   06/24/21 0000  Leave dressing on - Keep it clean, dry, and intact until clinic visit        06/24/21 1144            Allergies  Allergen Reactions   Penicillins Hives, Swelling and Other  (See Comments)   Sulfonamide Derivatives Hives and Swelling   Lactose Intolerance (Gi) Diarrhea   Sulfamethoxazole Swelling    Consultations: Orthopedic surgery  Procedures/Studies: DG Chest 1 View  Result Date: 06/19/2021 CLINICAL DATA:  Fall EXAM: CHEST  1 VIEW COMPARISON:  11/21/2020 FINDINGS: The heart size and mediastinal contours are within normal limits. Both lungs are clear. The visualized skeletal structures are unremarkable. IMPRESSION: No active disease. Electronically Signed   By: Fidela Salisbury MD   On: 06/19/2021 22:48   DG Wrist Complete Left  Result Date: 06/19/2021 CLINICAL DATA:  Fall, left wrist pain EXAM: LEFT WRIST - COMPLETE 3+ VIEW COMPARISON:  None. FINDINGS: Three view radiograph left wrist demonstrates normal alignment. No fracture or dislocation. Joint spaces are preserved. There is moderate soft tissue swelling dorsal to the a distal radius and ulna. IMPRESSION: Soft tissue swelling.  No fracture or dislocation. Electronically Signed   By: Fidela Salisbury MD  On: 06/19/2021 22:52   CT HEAD WO CONTRAST  Result Date: 06/19/2021 CLINICAL DATA:  Fall, head injury EXAM: CT HEAD WITHOUT CONTRAST TECHNIQUE: Contiguous axial images were obtained from the base of the skull through the vertex without intravenous contrast. COMPARISON:  MRI 01/14/2018 FINDINGS: Brain: Normal anatomic configuration. Parenchymal volume loss is commensurate with the patient's age. Mild periventricular white matter changes are present likely reflecting the sequela of small vessel ischemia. No abnormal intra or extra-axial mass lesion or fluid collection. No abnormal mass effect or midline shift. No evidence of acute intracranial hemorrhage or infarct. Ventricular size is normal. Cerebellum unremarkable. Vascular: No asymmetric hyperdense vasculature at the skull base. Skull: Intact Sinuses/Orbits: Paranasal sinuses are clear. Orbits are unremarkable. Other: Mastoid air cells and middle ear cavities  are clear. IMPRESSION: No acute intracranial hemorrhage or infarct.  Mild senescent change. Electronically Signed   By: Fidela Salisbury MD   On: 06/19/2021 22:56   CT Hip Left Wo Contrast  Result Date: 06/19/2021 CLINICAL DATA:  Fall EXAM: CT OF THE LEFT HIP WITHOUT CONTRAST TECHNIQUE: Multidetector CT imaging of the left hip was performed according to the standard protocol. Multiplanar CT image reconstructions were also generated. COMPARISON:  Plain films earlier today FINDINGS: Bones/Joint/Cartilage Left femoral intertrochanteric fracture noted. The fracture is mildly comminuted and impacted. No subluxation or dislocation. Fracture is acute. Ligaments Suboptimally assessed by CT. Muscles and Tendons Unremarkable Soft tissues Unremarkable. IMPRESSION: Comminuted, impacted acute left femoral intertrochanteric fracture. Electronically Signed   By: Rolm Baptise M.D.   On: 06/19/2021 23:55   DG C-Arm 1-60 Min  Result Date: 06/20/2021 CLINICAL DATA:  Intramedullary nail. EXAM: LEFT FEMUR 2 VIEWS; DG C-ARM 1-60 MIN COMPARISON:  Hip radiograph and CT yesterday FINDINGS: Five fluoroscopic spot views of the left femur obtained in the operating room. Intramedullary nail with trans trochanteric screw fixation of intertrochanteric femur fracture fluoroscopy time 38 seconds. Dose 7.67 mGy. IMPRESSION: Fluoroscopic spot views during ORIF left femur fracture. Electronically Signed   By: Keith Rake M.D.   On: 06/20/2021 15:24   DG Hip Unilat With Pelvis 2-3 Views Left  Result Date: 06/19/2021 CLINICAL DATA:  Fall, left hip pain EXAM: DG HIP (WITH OR WITHOUT PELVIS) 2-3V LEFT COMPARISON:  10/04/2017 FINDINGS: There is an age-indeterminate impacted basicervical left hip fracture. There is suggestion of trabecula bridging the fracture plane on lateral examination suggesting that this may be healed, however, this is not optimally assessed on this examination. There is no dislocation. No other fracture identified. Mild  left hip degenerative arthritis is noted. IMPRESSION: Age indeterminate basicervical fracture of the left hip. Dedicated CT imaging is recommended for further evaluation. Electronically Signed   By: Fidela Salisbury MD   On: 06/19/2021 22:51   DG FEMUR MIN 2 VIEWS LEFT  Result Date: 06/20/2021 CLINICAL DATA:  Intramedullary nail. EXAM: LEFT FEMUR 2 VIEWS; DG C-ARM 1-60 MIN COMPARISON:  Hip radiograph and CT yesterday FINDINGS: Five fluoroscopic spot views of the left femur obtained in the operating room. Intramedullary nail with trans trochanteric screw fixation of intertrochanteric femur fracture fluoroscopy time 38 seconds. Dose 7.67 mGy. IMPRESSION: Fluoroscopic spot views during ORIF left femur fracture. Electronically Signed   By: Keith Rake M.D.   On: 06/20/2021 15:24     Subjective: No acute issues or events overnight, tolerating PT more appropriately over the past 24 hours, pain well controlled at this time denies nausea vomiting diarrhea constipation headache fevers or chills.   Discharge Exam: Vitals:  06/24/21 0350 06/24/21 0737  BP: (!) 120/58 (!) 115/51  Pulse: 78 80  Resp:  16  Temp: 98.1 F (36.7 C) 98.3 F (36.8 C)  SpO2: 96% 93%   Vitals:   06/23/21 1533 06/23/21 1955 06/24/21 0350 06/24/21 0737  BP: 108/67 129/60 (!) 120/58 (!) 115/51  Pulse: 89 83 78 80  Resp: 17 15  16   Temp: 98.3 F (36.8 C) 98 F (36.7 C) 98.1 F (36.7 C) 98.3 F (36.8 C)  TempSrc: Oral Oral Oral Oral  SpO2: 94% 96% 96% 93%  Weight:      Height:       General:  Pleasantly resting in bed, No acute distress. HEENT:  Normocephalic atraumatic.  Sclerae nonicteric, noninjected.  Extraocular movements intact bilaterally. Neck:  Without mass or deformity.  Trachea is midline. Lungs:  Clear to auscultate bilaterally without rhonchi, wheeze, or rales. Heart:  Regular rate and rhythm.  Without murmurs, rubs, or gallops. Abdomen:  Soft, nontender, nondistended.  Without guarding or  rebound. Extremities: Without cyanosis, clubbing, edema, or obvious deformity.  Left hip bandage clean dry intact, limited range of motion left lower extremity secondary to pain. Vascular:  Dorsalis pedis and posterior tibial pulses palpable bilaterally. Skin:  Warm and dry, no erythema, no ulcerations.   The results of significant diagnostics from this hospitalization (including imaging, microbiology, ancillary and laboratory) are listed below for reference.     Microbiology: Recent Results (from the past 240 hour(s))  Resp Panel by RT-PCR (Flu A&B, Covid) Nasopharyngeal Swab     Status: None   Collection Time: 06/20/21 12:40 AM   Specimen: Nasopharyngeal Swab; Nasopharyngeal(NP) swabs in vial transport medium  Result Value Ref Range Status   SARS Coronavirus 2 by RT PCR NEGATIVE NEGATIVE Final    Comment: (NOTE) SARS-CoV-2 target nucleic acids are NOT DETECTED.  The SARS-CoV-2 RNA is generally detectable in upper respiratory specimens during the acute phase of infection. The lowest concentration of SARS-CoV-2 viral copies this assay can detect is 138 copies/mL. A negative result does not preclude SARS-Cov-2 infection and should not be used as the sole basis for treatment or other patient management decisions. A negative result may occur with  improper specimen collection/handling, submission of specimen other than nasopharyngeal swab, presence of viral mutation(s) within the areas targeted by this assay, and inadequate number of viral copies(<138 copies/mL). A negative result must be combined with clinical observations, patient history, and epidemiological information. The expected result is Negative.  Fact Sheet for Patients:  EntrepreneurPulse.com.au  Fact Sheet for Healthcare Providers:  IncredibleEmployment.be  This test is no t yet approved or cleared by the [place] FDA and  has been authorized for detection and/or diagnosis of  SARS-CoV-2 by FDA under an Emergency Use Authorization (EUA). This EUA will remain  in effect (meaning this test can be used) for the duration of the COVID-19 declaration under Section 564(b)(1) of the Act, 21 U.S.C.section 360bbb-3(b)(1), unless the authorization is terminated  or revoked sooner.       Influenza A by PCR NEGATIVE NEGATIVE Final   Influenza B by PCR NEGATIVE NEGATIVE Final    Comment: (NOTE) The Xpert Xpress SARS-CoV-2/FLU/RSV plus assay is intended as an aid in the diagnosis of influenza from Nasopharyngeal swab specimens and should not be used as a sole basis for treatment. Nasal washings and aspirates are unacceptable for Xpert Xpress SARS-CoV-2/FLU/RSV testing.  Fact Sheet for Patients: EntrepreneurPulse.com.au  Fact Sheet for Healthcare Providers: IncredibleEmployment.be  This test is not yet  approved or cleared by the [place] and has been authorized for detection and/or diagnosis of SARS-CoV-2 by FDA under an Emergency Use Authorization (EUA). This EUA will remain in effect (meaning this test can be used) for the duration of the COVID-19 declaration under Section 564(b)(1) of the Act, 21 U.S.C. section 360bbb-3(b)(1), unless the authorization is terminated or revoked.  Performed at [facility], [place] [address], [place], [place] [address]   Surgical pcr screen     Status: None   Collection Time: 06/20/21  1:29 PM   Specimen: Nasal Mucosa; Nasal Swab  Result Value Ref Range Status   MRSA, PCR NEGATIVE NEGATIVE Final   Staphylococcus aureus NEGATIVE NEGATIVE Final    Comment: (NOTE) The Xpert SA Assay (FDA approved for NASAL specimens in patients 69 years of age and older), is one component of a comprehensive surveillance program. It is not intended to diagnose infection nor to guide or monitor treatment. Performed at [facility], [place] [address] [place], [place] [address]      Labs: BNP  (last 3 results) No results for input(s): BNP in the last 8760 hours. Basic Metabolic Panel: Recent Labs  Lab 06/19/21 2210 06/20/21 0328 06/21/21 0153 06/22/21 0141  NA 136 135 135 137  K 2.8* 3.4* 3.9 3.7  CL 96* 95* 96* 98  CO2 27 31 31  32  GLUCOSE 122* 150* 193* 138*  BUN 5* 6* 6* 9  CREATININE 0.62 0.66 0.66 0.66  CALCIUM 8.6* 8.6* 8.6* 8.4*  MG  --  2.3  --   --    Liver Function Tests: No results for input(s): AST, ALT, ALKPHOS, BILITOT, PROT, ALBUMIN in the last 168 hours. No results for input(s): LIPASE, AMYLASE in the last 168 hours. No results for input(s): AMMONIA in the last 168 hours. CBC: Recent Labs  Lab 06/19/21 2210 06/20/21 0328 06/21/21 0153 06/22/21 0141 06/23/21 1252  WBC 6.7 7.5 6.9 7.1 5.2  NEUTROABS 5.0  --   --   --   --   HGB 13.2 12.7 9.6* 8.2* 8.8*  HCT 38.6 36.7 28.4* 24.1* 25.9*  MCV 96.0 96.3 96.9 96.4 97.0  PLT 146* 128* 130* 140* 161   Cardiac Enzymes: No results for input(s): CKTOTAL, CKMB, CKMBINDEX, TROPONINI in the last 168 hours. BNP: Invalid input(s): POCBNP CBG: No results for input(s): GLUCAP in the last 168 hours. D-Dimer No results for input(s): DDIMER in the last 72 hours. Hgb A1c No results for input(s): HGBA1C in the last 72 hours. Lipid Profile No results for input(s): CHOL, HDL, LDLCALC, TRIG, CHOLHDL, LDLDIRECT in the last 72 hours. Thyroid function studies No results for input(s): TSH, T4TOTAL, T3FREE, THYROIDAB in the last 72 hours.  Invalid input(s): FREET3 Anemia work up No results for input(s): VITAMINB12, FOLATE, FERRITIN, TIBC, IRON, RETICCTPCT in the last 72 hours. Urinalysis    Component Value Date/Time   COLORURINE YELLOW 05/10/2009 1142   APPEARANCEUR CLEAR 05/10/2009 1142   LABSPEC 1.010 05/10/2009 1142   PHURINE 7.5 05/10/2009 1142   GLUCOSEU NEGATIVE 05/10/2009 1142   HGBUR NEGATIVE 05/10/2009 1142   BILIRUBINUR NEG 08/09/2018 1724   KETONESUR NEGATIVE 05/10/2009 1142   PROTEINUR  Negative 08/09/2018 1724   PROTEINUR NEGATIVE 05/10/2009 1142   UROBILINOGEN 0.2 08/09/2018 1724   UROBILINOGEN 0.2 05/10/2009 1142   NITRITE NEG 08/09/2018 1724   NITRITE NEGATIVE 05/10/2009 1142   LEUKOCYTESUR Negative 08/09/2018 1724   Sepsis Labs Invalid input(s): PROCALCITONIN,  WBC,  LACTICIDVEN Microbiology  Recent Results (from the past 240 hour(s))  Resp Panel by RT-PCR (Flu A&B, Covid) Nasopharyngeal Swab     Status: None   Collection Time: 06/20/21 12:40 AM   Specimen: Nasopharyngeal Swab; Nasopharyngeal(NP) swabs in vial transport medium  Result Value Ref Range Status   SARS Coronavirus 2 by RT PCR NEGATIVE NEGATIVE Final    Comment: (NOTE) SARS-CoV-2 target nucleic acids are NOT DETECTED.  The SARS-CoV-2 RNA is generally detectable in upper respiratory specimens during the acute phase of infection. The lowest concentration of SARS-CoV-2 viral copies this assay can detect is 138 copies/mL. A negative result does not preclude SARS-Cov-2 infection and should not be used as the sole basis for treatment or other patient management decisions. A negative result may occur with  improper specimen collection/handling, submission of specimen other than nasopharyngeal swab, presence of viral mutation(s) within the areas targeted by this assay, and inadequate number of viral copies(<138 copies/mL). A negative result must be combined with clinical observations, patient history, and epidemiological information. The expected result is Negative.  Fact Sheet for Patients:  EntrepreneurPulse.com.au  Fact Sheet for Healthcare Providers:  IncredibleEmployment.be  This test is no t yet approved or cleared by the [place] FDA and  has been authorized for detection and/or diagnosis of SARS-CoV-2 by FDA under an Emergency Use Authorization (EUA). This EUA will remain  in effect (meaning this test can be used) for the duration of the COVID-19  declaration under Section 564(b)(1) of the Act, 21 U.S.C.section 360bbb-3(b)(1), unless the authorization is terminated  or revoked sooner.       Influenza A by PCR NEGATIVE NEGATIVE Final   Influenza B by PCR NEGATIVE NEGATIVE Final    Comment: (NOTE) The Xpert Xpress SARS-CoV-2/FLU/RSV plus assay is intended as an aid in the diagnosis of influenza from Nasopharyngeal swab specimens and should not be used as a sole basis for treatment. Nasal washings and aspirates are unacceptable for Xpert Xpress SARS-CoV-2/FLU/RSV testing.  Fact Sheet for Patients: EntrepreneurPulse.com.au  Fact Sheet for Healthcare Providers: IncredibleEmployment.be  This test is not yet approved or cleared by the [place] FDA and has been authorized for detection and/or diagnosis of SARS-CoV-2 by FDA under an Emergency Use Authorization (EUA). This EUA will remain in effect (meaning this test can be used) for the duration of the COVID-19 declaration under Section 564(b)(1) of the Act, 21 U.S.C. section 360bbb-3(b)(1), unless the authorization is terminated or revoked.  Performed at [facility], [place] [address], [place], [place] [address]   Surgical pcr screen     Status: None   Collection Time: 06/20/21  1:29 PM   Specimen: Nasal Mucosa; Nasal Swab  Result Value Ref Range Status   MRSA, PCR NEGATIVE NEGATIVE Final   Staphylococcus aureus NEGATIVE NEGATIVE Final    Comment: (NOTE) The Xpert SA Assay (FDA approved for NASAL specimens in patients 63 years of age and older), is one component of a comprehensive surveillance program. It is not intended to diagnose infection nor to guide or monitor treatment. Performed at [facility], [place] [address], [place], [place] [address]      Time coordinating discharge: Over 30 minutes  SIGNED:   Little Ishikawa, DO [facility] 06/24/2021, 11:44 AM Pager   If 7PM-7AM, please contact  night-coverage [email or web address]

## 2021-06-24 NOTE — Progress Notes (Signed)
Inpatient Rehabilitation Medication Review by a Pharmacist  A complete drug regimen review was completed for this patient to identify any potential clinically significant medication issues.  Clinically significant medication issues were identified: No   Check AMION for pharmacist assigned to patient if future medication questions/issues arise during this admission.  Pharmacist comments:   Time spent performing this drug regimen review (minutes):  15   Camiah Humm 06/24/2021 5:21 PM

## 2021-06-24 NOTE — Progress Notes (Signed)
PMR Admission Coordinator Pre-Admission Assessment   Patient: Carrie Chung is an 80 y.o., female MRN: [id number] DOB: Aug 03, 1941 Height: 5\' 1"  (154.9 cm) Weight: 68 kg   Insurance Information HMO:     PPO:      PCP:      IPA:      80/20: yes     OTHER: PRIMARY: Medicare A & B      Policy#: [id number]      Subscriber: patient CM Name:       Phone#:      Fax#: Pre-Cert#: verified online      Employer: Benefits:  Phone #: Verified eligibility via [employer] on 06/22/21     Name: Eff. Date: Part A & B effective 08/21/05     Deduct: $1,556      Out of Pocket Max: NA      Life Max: NA CIR: 100% with Medicare approval      SNF: 100% days 1-20, 80% days 21-100 Outpatient: 80%     Co-Pay: 20% Home Health: 100%      Co-Pay: DME: 80%     Co-Pay: 20% Providers: pt's choice SECONDARY: BCBS      Policy#: [id number]     Phone#: [phone]   Financial Counselor:       Phone#:   The "Data Collection Information Summary" for patients in Inpatient Rehabilitation Facilities with attached "Privacy Act [place] Records" was provided and verbally reviewed with: Family   Emergency Contact Information Contact Information       Name Relation Home Work Carrie Chung Daughter     [phone]           Current Medical History  Patient Admitting Diagnosis: closed Left hip fx History of Present Illness: Carrie Chung is an 80 year old right-handed female with history of obstructive CAD status post stenting maintained on aspirin and Plavix, atrial tachycardia, hypertension, depression/anxiety, nocturnal oxygen requirement and quit smoking 41 years ago.  Presented 06/19/2021 after mechanical fall landing on left hip without loss of consciousness.  Cranial CT scan negative.  X-rays and imaging revealed acute left femoral intertrochanteric hip fracture.  Status post IM nailing 06/20/2021 per Dr. Percell Miller.  Weightbearing as tolerated left lower extremity.  Patient's chronic aspirin  and Plavix has since been resumed postoperatively.  Acute blood loss anemia 8.8 and monitored.  Therapy evaluations completed due to patient's decreased functional mobility and pain, and she was recommended for a comprehensive rehab program.   Patient's medical record from [facility] has been reviewed by the rehabilitation admission coordinator and physician.   Past Medical History      Past Medical History:  Diagnosis Date   Atrial tachycardia ([place])      a. noted at cardiac rehab 5/13; event monitor ordered to assess for AFib   CAD (coronary artery disease)      a. NSTEMI 03/2012 ([place] 03/27/12: pLAD 30%, mLAD 99%, mCFX 95%, mRCA 99% with thrombus, EF 65%);  s/p PTCA/DESx1 to prox-mid RCA 03/27/12 urgently in setting of hypotension/bradycardia, with staged PTCA/Evolve study stent to mid LAD & PTCA/Evolve study stent to prox LCx 03/29/12 ;   echo 03/28/12:EF 60%, Aortic sclerosis without AS, mild RVE, mild reduced RVSF     Endometrial polyp     HTN (hypertension)     Hyperlipidemia     MI, old     Sleep apnea        Family History   family history includes Cancer - Other in  her father; Colon cancer in her father; Coronary artery disease in her mother.   Prior Rehab/Hospitalizations Has the patient had prior rehab or hospitalizations prior to admission? No   Has the patient had major surgery during 100 days prior to admission? Yes              Current Medications   Current Facility-Administered Medications:   acetaminophen (TYLENOL) tablet 1,000 mg, 1,000 mg, Oral, Q8H, Ainsley Spinner, PA-C, 1,000 mg at 06/24/21 7628   ALPRAZolam Duanne Moron) tablet 1 mg, 1 mg, Oral, QHS PRN, Gawne, Meghan M, PA-C, 1 mg at 06/22/21 2130   alum & mag hydroxide-simeth (MAALOX/MYLANTA) 200-200-20 MG/5ML suspension 30 mL, 30 mL, Oral, Q4H PRN, Gawne, Meghan M, PA-C, 30 mL at 06/21/21 2109   aspirin EC tablet 81 mg, 81 mg, Oral, Daily, Little Ishikawa, MD, 81 mg at 06/24/21 3151   bisacodyl (DULCOLAX)  suppository 10 mg, 10 mg, Rectal, Daily PRN, Gawne, Meghan M, PA-C   clopidogrel (PLAVIX) tablet 75 mg, 75 mg, Oral, Daily, Skeet Simmer, RPH, 75 mg at 06/24/21 7616   docusate sodium (COLACE) capsule 100 mg, 100 mg, Oral, BID, Gawne, Meghan M, PA-C, 100 mg at 06/23/21 2116   feeding supplement (ENSURE ENLIVE / ENSURE PLUS) liquid 237 mL, 237 mL, Oral, BID BM, Gawne, Meghan M, PA-C, 237 mL at 06/22/21 0948   magnesium citrate solution 1 Bottle, 1 Bottle, Oral, Once PRN, Gawne, Meghan M, PA-C   menthol-cetylpyridinium (CEPACOL) lozenge 3 mg, 1 lozenge, Oral, PRN **OR** phenol (CHLORASEPTIC) mouth spray 1 spray, 1 spray, Mouth/Throat, PRN, Gawne, Meghan M, PA-C   methocarbamol (ROBAXIN) tablet 500 mg, 500 mg, Oral, Q6H PRN, Little Ishikawa, MD   metoprolol tartrate (LOPRESSOR) tablet 25 mg, 25 mg, Oral, BID, Little Ishikawa, MD, 25 mg at 06/24/21 0737   morphine 2 MG/ML injection 0.5-1 mg, 0.5-1 mg, Intravenous, Q2H PRN, Gawne, Meghan M, PA-C, 1 mg at 06/23/21 1136   multivitamin with minerals tablet 1 tablet, 1 tablet, Oral, Daily, Gawne, Meghan M, PA-C, 1 tablet at 06/24/21 0851   ondansetron (ZOFRAN) tablet 4 mg, 4 mg, Oral, Q6H PRN **OR** ondansetron (ZOFRAN) injection 4 mg, 4 mg, Intravenous, Q6H PRN, Gawne, Meghan M, PA-C   oxyCODONE (Oxy IR/ROXICODONE) immediate release tablet 5-10 mg, 5-10 mg, Oral, Q4H PRN, Ainsley Spinner, PA-C, 5 mg at 06/24/21 1045   pantoprazole (PROTONIX) EC tablet 40 mg, 40 mg, Oral, Daily, Gawne, Meghan M, PA-C, 40 mg at 06/24/21 0851   polyethylene glycol (MIRALAX / GLYCOLAX) packet 17 g, 17 g, Oral, Daily PRN, Gawne, Meghan M, PA-C   rosuvastatin (CRESTOR) tablet 20 mg, 20 mg, Oral, QHS, Gawne, Meghan M, PA-C, 20 mg at 06/23/21 2116   senna-docusate (Senokot-S) tablet 1 tablet, 1 tablet, Oral, QHS PRN, Gawne, Meghan M, PA-C   venlafaxine XR (EFFEXOR-XR) 24 hr capsule 75 mg, 75 mg, Oral, Q breakfast, Gawne, Meghan M, PA-C, 75 mg at 06/24/21 1062   Patients  Current Diet:  Diet Order                  Diet - low sodium heart healthy             Diet heart healthy/carb modified Room service appropriate? Yes; Fluid consistency: Thin  Diet effective now                         Precautions / Restrictions Precautions Precautions: Fall Restrictions Weight Bearing Restrictions: Yes  LLE Weight Bearing: Weight bearing as tolerated    Has the patient had 2 or more falls or a fall with injury in the past year? Yes   Prior Activity Level Limited Community (1-2x/wk): ~3 days/week gets out of house; drives   Prior Functional Level Self Care: Did the patient need help bathing, dressing, using the toilet or eating? Independent   Indoor Mobility: Did the patient need assistance with walking from room to room (with or without device)? Independent   Stairs: Did the patient need assistance with internal or external stairs (with or without device)? Independent   Functional Cognition: Did the patient need help planning regular tasks such as shopping or remembering to take medications? Independent   Home Assistive Devices / Equipment Home Assistive Devices/Equipment: None Home Equipment: Crutches, Cane - single point, Hand held shower head   Prior Device Use: Indicate devices/aids used by the patient prior to current illness, exacerbation or injury? 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    Extremity Assessment (includes Sensation/Coordination)   Upper Extremity Assessment: Overall WFL for tasks assessed  Lower Extremity Assessment: Defer to PT evaluation LLE Deficits / Details: reports no numbness or tingling, able to move at  ankle and toes. limited knee and hip due to pain LLE: Unable to fully assess due to pain LLE Sensation: WNL     ADLs   Overall ADL's : Needs assistance/impaired Eating/Feeding: Independent, Sitting Grooming: Wash/dry hands, Wash/dry face, Applying deodorant, Oral care, Set up, Sitting Upper Body Bathing: Set up, Sitting Lower Body Bathing: Maximal assistance, +2 for physical assistance, Sit to/from stand Upper Body Dressing : Set up, Sitting Lower Body Dressing: Maximal assistance, +2 for physical assistance, Sit to/from stand Toilet Transfer: Maximal assistance, +2 for physical assistance, +2 for safety/equipment, BSC, RW, Stand-pivot Toileting- Clothing Manipulation and Hygiene: Maximal assistance, +2 for physical assistance, +2 for safety/equipment Toileting - Clothing Manipulation Details (indicate cue type and reason): After BM - pt required totat A for hygiene in standing, BUE reliant on ecxternal support in standing Functional mobility during ADLs: Moderate assistance, +2 for physical assistance, +2 for safety/equipment, Rolling walker, Cueing for safety, Cueing for sequencing General ADL Comments: pt would benefit from AE training to increase indep in lower body ADLs     Mobility   Overal bed mobility: Needs Assistance Bed Mobility: Supine to Sit Supine to sit: HOB elevated, Mod assist Sit to supine: Max assist, HOB elevated General bed mobility comments: increased time and effort needed, use of bed rails, assistance needed for L LE movement off of bed and use of bed pads to scoot hips forwards towards EOB, pt with good initiation needs modA more for scooting hips to EOB than for trunk rise     Transfers   Overall transfer level: Needs assistance Equipment used: Rolling walker (2 wheeled) Transfers: Sit to/from Stand, [employer] Transfers Sit to Stand: Mod assist, +2 physical assistance Stand pivot transfers: Mod assist, +2 physical assistance, Max assist  Lateral/Scoot  Transfers: Mod assist General transfer comment: verbal and tactile cueing needed for safe hand placement and technique with RW, mod A x2 needed to power into a full upright standing position and modA progressing to maxA to pivot to [facility] towards  her R side. She then performed a second sit<>stand from [facility] and needed recliner chair pulled behind her in standing as she was too fatigued to pivot from [facility].     Ambulation / Gait / Stairs / Wheelchair Mobility   Ambulation/Gait General Gait Details: unable; ~2-3 pivotal steps prior to sitting impulsively; poor weight acceptance through LLE     Posture / Balance Balance Overall balance assessment: Needs assistance Sitting-balance support: Feet supported Sitting balance-Leahy Scale: Fair Standing balance support: During functional activity, Bilateral upper extremity supported Standing balance-Leahy Scale: Poor Standing balance comment: reliant on RW and +2 mod/maxA     Special needs/care consideration Skin Surgical incision: Left hip and Designated visitor Kathleen Argue, daughter    Previous Home Environment (from acute therapy documentation) Living Arrangements: Spouse/significant other  Lives With: Spouse Available Help at Discharge: Family, Available 24 hours/day Type of Home: House Home Layout: Two level, Bed/bath upstairs, Other (Comment) (plan to make arrangements for pt to sleep downstairs once home) Alternate Level Stairs-Rails: Right Alternate Level Stairs-Number of Steps: 14 Home Access: Stairs to enter Entrance Stairs-Rails: None Entrance Stairs-Number of Steps: 3 in front, 4 in garage Bathroom Shower/Tub: Chiropodist: Standard Bathroom Accessibility: Yes How Accessible: Accessible via walker Home Care Services: No Additional Comments: caregiver to her husband who has balance impairments and early stage of dementia   Discharge Living Setting Plans for Discharge Living Setting: Patient's home Type of Home at  Discharge: House Discharge Home Layout: Two level, Bed/bath upstairs, Other (Comment) (plan to make arrangements for pt to sleep downstairs when returns home) Alternate Level Stairs-Rails: Right Alternate Level Stairs-Number of Steps: 14 Discharge Home Access: Stairs to enter Entrance Stairs-Rails: None Entrance Stairs-Number of Steps: 3 in front, 4 in garage Discharge Bathroom Shower/Tub: Tub/shower unit Discharge Bathroom Toilet: Standard Discharge Bathroom Accessibility: Yes How Accessible: Accessible via walker Does the patient have any problems obtaining your medications?: No   Social/Family/Support Systems Patient Roles: Caregiver Anticipated Caregiver: Reeves Forth (daughter) is main contact Anticipated Ambulance person Information: [phone] Caregiver Availability: Intermittent (family planning on hiring assistance) Discharge Plan Discussed with Primary Caregiver: Yes Is Caregiver In Agreement with Plan?: Yes Does Caregiver/Family have Issues with Lodging/Transportation while Pt is in Rehab?: No   Goals Patient/Family Goal for Rehab: PT/OT supervision; SLP n/a Expected length of stay: 21-24 days Pt/Family Agrees to Admission and willing to participate: Yes Program Orientation Provided & Reviewed with Pt/Caregiver Including Roles  & Responsibilities: Yes  Barriers to Discharge: Decreased caregiver support   Decrease burden of Care through IP rehab admission: NA   Possible need for SNF placement upon discharge: Not anticipated   Patient Condition: I have reviewed medical records from [facility], spoken with CM, and patient and daughter. I discussed via phone for inpatient rehabilitation assessment.  Patient will benefit from ongoing PT and OT, can actively participate in 3 hours of therapy a day 5 days of the week, and can make measurable gains during the admission.  Patient will also benefit from the coordinated team approach during an Inpatient Acute Rehabilitation  admission.  The patient will receive intensive therapy as well as Rehabilitation physician, nursing, social worker, and care management interventions.  Due to safety, skin/wound care, disease management, medication administration, pain management, and patient education the patient requires 24 hour a day rehabilitation nursing.  The patient is currently mod/max +2 with mobility and basic ADLs.  Discharge setting and therapy post discharge at  home  is anticipated.  Patient has agreed to  participate in the Acute Inpatient Rehabilitation Program and will admit today.   Preadmission Screen Completed By:  Michel Santee, 06/24/2021 12:20 PM ______________________________________________________________________   Discussed status with Dr. Ranell Patrick on 06/24/21  at 12:20 PM  and received approval for admission today.   Admission Coordinator:  Michel Santee, PT, time 12:20 PM Sudie Grumbling 06/24/21     Assessment/Plan: Diagnosis: Closed left hip fracture Does the need for close, 24 hr/day Medical supervision in concert with the patient's rehab needs make it unreasonable for this patient to be served in a less intensive setting? Yes Co-Morbidities requiring supervision/potential complications: overweight (BMI 28/34), significant post-operative pain requiring morphine, insomnia, anxiety, HTN, CAD Due to bladder management, bowel management, safety, skin/wound care, disease management, medication administration, pain management, and patient education, does the patient require 24 hr/day rehab nursing? Yes Does the patient require coordinated care of a physician, rehab nurse, PT, OT to address physical and functional deficits in the context of the above medical diagnosis(es)? Yes Addressing deficits in the following areas: balance, endurance, locomotion, strength, transferring, bowel/bladder control, bathing, dressing, feeding, grooming, toileting, and psychosocial support Can the patient actively participate in an  intensive therapy program of at least 3 hrs of therapy 5 days a week? Yes The potential for patient to make measurable gains while on inpatient rehab is good Anticipated functional outcomes upon discharge from inpatient rehab: modified independent PT, modified independent OT, independent SLP Estimated rehab length of stay to reach the above functional goals is: 2-3 weeks Anticipated discharge destination: Home 10. Overall Rehab/Functional Prognosis: good     MD Signature: Leeroy Cha, MD          Revision History                                  Note Details  Author Izora Ribas, MD File Time 06/24/2021 12:24 PM  Author Type Physician Status Signed  Last Editor Izora Ribas, MD Service Physical Medicine and Rehabilitation

## 2021-06-24 NOTE — Anesthesia Postprocedure Evaluation (Signed)
Anesthesia Post Note  Patient: Carrie Chung  Procedure(s) Performed: INTRAMEDULLARY (IM) NAIL INTERTROCHANTRIC (Left)     Patient location during evaluation: PACU Anesthesia Type: General Level of consciousness: awake and alert Pain management: pain level controlled Vital Signs Assessment: post-procedure vital signs reviewed and stable Respiratory status: spontaneous breathing, nonlabored ventilation, respiratory function stable and patient connected to nasal cannula oxygen Cardiovascular status: blood pressure returned to baseline and stable Postop Assessment: no apparent nausea or vomiting Anesthetic complications: no   No notable events documented.  Last Vitals:  Vitals:   06/24/21 0350 06/24/21 0737  BP: (!) 120/58 (!) 115/51  Pulse: 78 80  Resp:  16  Temp: 36.7 C 36.8 C  SpO2: 96% 93%    Last Pain:  Vitals:   06/24/21 0759  TempSrc:   PainSc: 0-No pain                 Melitta Tigue L April Carlyon

## 2021-06-24 NOTE — Care Management Important Message (Signed)
Important Message  Patient Details  Name: Carrie Chung MRN: [id number] Date of Birth: Mar 01, 1941   Medicare Important Message Given:  Yes     [name] [place] 06/24/2021, 3:37 PM

## 2021-06-24 NOTE — Progress Notes (Signed)
Report given to [facility] at 4M06. All questions and concerns were fully addressed.

## 2021-06-24 NOTE — H&P (Signed)
Physical Medicine and Rehabilitation Admission H&P  CC: left hip intertrochanteric fracture  HPI: Carrie Chung is an 80 year old right-handed female with history of obstructive CAD status post stenting maintained on aspirin and Plavix, atrial tachycardia, hypertension, depression/anxiety, nocturnal oxygen requirement and quit smoking 41 years ago.  Per chart review patient lives with spouse.  Independent prior to admission.  Multilevel home bed and bath upstairs 3 steps to entry.  Husband is limited physically to assist.  She does have a daughter in the area.  Presented 06/19/2021 after mechanical fall landing on left hip without loss of consciousness.  Cranial CT scan negative.  X-rays and imaging revealed acute left femoral intertrochanteric hip fracture.  Status post IM nailing 06/20/2021 per Dr. Percell Miller.  Weightbearing as tolerated left lower extremity.  Patient's chronic aspirin and Plavix has since been resumed postoperatively.  Acute blood loss anemia 8.8 and monitored.  Therapy evaluations completed due to patient's decreased functional mobility was admitted for a comprehensive rehab program. Currently complains of pain at fracture site.   Review of Systems  Constitutional:  Negative for chills and fever.  HENT:  Negative for hearing loss.   Eyes:  Negative for blurred vision, double vision and discharge.  Respiratory:  Positive for shortness of breath. Negative for cough.   Cardiovascular:  Positive for palpitations. Negative for chest pain and leg swelling.  Gastrointestinal:  Positive for constipation. Negative for heartburn, nausea and vomiting.  Genitourinary:  Negative for dysuria, flank pain and hematuria.  Musculoskeletal:  Positive for joint pain and myalgias.  Skin:  Negative for rash.  Psychiatric/Behavioral:  Positive for depression.        Anxiety  All other systems reviewed and are negative. Past Medical History:  Diagnosis Date   Atrial tachycardia ([place])    a.  noted at cardiac rehab 5/13; event monitor ordered to assess for AFib   CAD (coronary artery disease)    a. NSTEMI 03/2012 ([facility] 03/27/12: pLAD 30%, mLAD 99%, mCFX 95%, mRCA 99% with thrombus, EF 65%);  s/p PTCA/DESx1 to prox-mid RCA 03/27/12 urgently in setting of hypotension/bradycardia, with staged PTCA/Evolve study stent to mid LAD & PTCA/Evolve study stent to prox LCx 03/29/12 ;   echo 03/28/12:EF 60%, Aortic sclerosis without AS, mild RVE, mild reduced RVSF     Endometrial polyp    HTN (hypertension)    Hyperlipidemia    MI, old    Sleep apnea    Past Surgical History:  Procedure Laterality Date   APPENDECTOMY  80 years old   CAROTID STENT  04/2012   x3   CESAREAN SECTION  1984   CHOLECYSTECTOMY  1978   INTRAMEDULLARY (IM) NAIL INTERTROCHANTERIC Left 06/20/2021   Procedure: INTRAMEDULLARY (IM) NAIL INTERTROCHANTRIC;  Surgeon: Renette Butters, MD;  Location: [facility];  Service: Orthopedics;  Laterality: Left;   LEFT HEART CATHETERIZATION WITH CORONARY ANGIOGRAM N/A 03/27/2012   Procedure: LEFT HEART CATHETERIZATION WITH CORONARY ANGIOGRAM;  Surgeon: Burnell Blanks, MD;  Location: [facility] CATH LAB;  Service: Cardiovascular;  Laterality: N/A;   PERCUTANEOUS CORONARY STENT INTERVENTION (PCI-S) N/A 03/27/2012   Procedure: PERCUTANEOUS CORONARY STENT INTERVENTION (PCI-S);  Surgeon: Burnell Blanks, MD;  Location: [facility] CATH LAB;  Service: Cardiovascular;  Laterality: N/A;   PERCUTANEOUS CORONARY STENT INTERVENTION (PCI-S) N/A 03/29/2012   Procedure: PERCUTANEOUS CORONARY STENT INTERVENTION (PCI-S);  Surgeon: [name], MD;  Location: [facility] CATH LAB;  Service: Cardiovascular;  Laterality: N/A;   TONSILLECTOMY  80 years old   Family History  Problem Relation Age of Onset   Coronary artery disease Mother        CABG at age 3, PPM   Colon cancer Father    Cancer - Other Father        larnyx   Social History:  reports that she quit smoking about 41 years ago. Her smoking use included cigarettes.  She has never used smokeless tobacco. She reports current alcohol use of about 20.0 standard drinks of alcohol per week. She reports that she does not use drugs. Allergies:  Allergies  Allergen Reactions   Penicillins Hives, Swelling and Other (See Comments)   Sulfonamide Derivatives Hives and Swelling   Lactose Intolerance (Gi) Diarrhea   Sulfamethoxazole Swelling   Medications Prior to Admission  Medication Sig Dispense Refill   acetaminophen (TYLENOL) 500 MG tablet Take 1,000 mg by mouth every 6 (six) hours as needed for moderate pain or headache.     ALPRAZolam (XANAX) 1 MG tablet TAKE 1 TABLET(1 MG) BY MOUTH AT BEDTIME AS NEEDED FOR SLEEP 90 tablet 2   aspirin 81 MG tablet Take 1 tablet (81 mg total) by mouth daily.     Calcium Carbonate-Vitamin D 600-400 MG-UNIT tablet Take 1 tablet by mouth every evening.     clopidogrel (PLAVIX) 75 MG tablet TAKE 1 TABLET(75 MG) BY MOUTH DAILY 90 tablet 2   desvenlafaxine (PRISTIQ) 50 MG 24 hr tablet TAKE 1 TABLET(50 MG) BY MOUTH DAILY 90 tablet 3   esomeprazole (NEXIUM) 40 MG capsule Take 40 mg by mouth daily at 12 noon.     FOLBIC 2.5-25-2 MG TABS tablet TAKE 1 TABLET BY MOUTH DAILY 90 tablet 3   HYDROcodone-homatropine (HYCODAN) 5-1.5 MG/5ML syrup Take 5 mLs by mouth every 8 (eight) hours as needed for cough. 120 mL 0   methocarbamol (ROBAXIN) 500 MG tablet Take 1 tablet (500 mg total) by mouth every 6 (six) hours as needed for muscle spasms. 20 tablet 0   metoprolol tartrate (LOPRESSOR) 25 MG tablet Take 1 tablet (25 mg total) by mouth 2 (two) times daily. 60 tablet 1   nitroGLYCERIN (NITROSTAT) 0.4 MG SL tablet Place 1 tablet (0.4 mg total) under the tongue every 5 (five) minutes as needed for chest pain. 25 tablet 3   oxyCODONE (OXY IR/ROXICODONE) 5 MG immediate release tablet Take 1-2 tablets (5-10 mg total) by mouth every 4 (four) hours as needed for severe pain or moderate pain. 30 tablet 0   OXYGEN Inhale into the lungs. Per patient using 2  liters at night.     polyethylene glycol (MIRALAX / GLYCOLAX) 17 g packet Take 17 g by mouth daily as needed for mild constipation. 14 each 0   rosuvastatin (CRESTOR) 20 MG tablet TAKE 1 TABLET(20 MG) BY MOUTH DAILY 90 tablet 3    Drug Regimen Review Drug regimen was reviewed and remains appropriate with no significant issues identified  Home: Home Living Family/patient expects to be discharged to:: Private residence Living Arrangements: Spouse/significant other, Children   Functional History:    Functional Status:  Mobility:          ADL:    Cognition:      Physical Exam: Height 5\' 1"  (1.549 m), weight 73.2 kg. Physical Exam Gen: no distress, normal appearing HEENT: oral mucosa pink and moist, NCAT Cardio: Reg rate Chest: normal effort, normal rate of breathing Abd: soft, non-distended Ext: no edema Psych: pleasant, normal affect, anxious about her pain Skin:    Comments: Hip incision is dressed.  Appropriately tender.  Neurological:     Comments: Patient is alert.  No acute distress.  Oriented x3 and follows commands. Strength intact except pain limited in LLE.  Results for orders placed or performed during the hospital encounter of 06/19/21 (from the past 48 hour(s))  CBC     Status: Abnormal   Collection Time: 06/23/21 12:52 PM  Result Value Ref Range   WBC 5.2 4.0 - 10.5 K/uL   RBC 2.67 (L) 3.87 - 5.11 MIL/uL   Hemoglobin 8.8 (L) 12.0 - 15.0 g/dL   HCT 25.9 (L) 36.0 - 46.0 %   MCV 97.0 80.0 - 100.0 fL   MCH 33.0 26.0 - 34.0 pg   MCHC 34.0 30.0 - 36.0 g/dL   RDW 13.4 11.5 - 15.5 %   Platelets 161 150 - 400 K/uL   nRBC 0.4 (H) 0.0 - 0.2 %    Comment: Performed at [facility], [place] [address] [place], [place] [address]   *Note: Due to a large number of results and/or encounters for the requested time period, some results have not been displayed. A complete set of results can be found in Results Review.   No results found.     Medical  Problem List and Plan: 1.   Debility secondary to left femoral intertrochanteric hip fracture.  Status post IM nailing 06/20/2021.  Weightbearing as tolerated  -patient may shower but incision must be covered  -ELOS/Goals: 2-3 weeks modI  -Admit to CIR 2.  Antithrombotics: -DVT/anticoagulation: SCDs.  Check vascular study  -antiplatelet therapy: Aspirin 81 mg daily and Plavix 75 mg daily 3. Pain Management: Hydrocodone and Robaxin as needed. History of negative side effects to gabapentin.  4. Mood: Effexor 75 mg daily, Xanax 1 mg nightly as needed  -antipsychotic agents: N/A 5. Neuropsych: This patient is capable of making decisions on her own behalf. 6. Skin/Wound Care: Routine skin checks 7. Fluids/Electrolytes/Nutrition: Routine in and outs with follow-up chemistries 8.  Acute blood loss anemia.  Follow-up CBC 9.  CAD with history of multiple stents.  Continue aspirin Plavix 10.  Hyperlipidemia.  Continue Crestor 11.  Nocturnal oxygen requirements.  Continue oxygen as prior to admission 12.  Hypertension.  Well controlled, continue Lopressor 25 mg twice daily.  Monitor with increased mobility 13.  Constipation.  Laxative assistance as directed  I have personally performed a face to face diagnostic evaluation, including, but not limited to relevant history and physical exam findings, of this patient and developed relevant assessment and plan.  Additionally, I have reviewed and concur with the physician assistant's documentation above.  Leeroy Cha, MD Lavon Paganini Angiulli, PA-C

## 2021-06-24 NOTE — Progress Notes (Signed)
Nutrition Follow-up  DOCUMENTATION CODES:   Not applicable  INTERVENTION:   -D/c Ensure Enlive po BID, each supplement provides 350 kcal and 20 grams of protein  -Boost Breeze po TID, each supplement provides 250 kcal and 9 grams of protein  -Continue MVI with minerals daily  NUTRITION DIAGNOSIS:   Increased nutrient needs related to post-op healing as evidenced by estimated needs.  Ongoing  GOAL:   Patient will meet greater than or equal to 90% of their needs  Progressing  MONITOR:   PO intake, Supplement acceptance, Diet advancement, Labs, Weight trends, Skin, I & O's  REASON FOR ASSESSMENT:   Consult Hip fracture protocol, Assessment of nutrition requirement/status  ASSESSMENT:   Carrie Chung is a 80 y.o. female with medical history significant for coronary artery disease, atrial tachycardia, hypertension, nocturnal oxygen requirement, depression, and anxiety, now presenting to the emergency department for evaluation of left hip pain after a fall.  The patient reports that she was in her usual state of health and having an uneventful day when she tripped on an uneven surface, landed on her left side, suffered abrasions to her left scalp, wrist, and elbow, and has been experiencing severe left hip pain.  She notes that she is typically active but has been limited in the past couple months due to increase in her chronic back pain.  Her back pain had improved significantly in March when she had an epidural injection but has since began to bother her again.  She is able to ascend a flight of stairs without any chest pain but does develop some shortness of breath with that level of activity.  She was treated with fentanyl by EMS.  7/3- s/p IMN  Reviewed I/O's: -400 ml x 24 hours and -1 L since admission  Spoke with pt at bedside, who reports fair appetite. She shares that she is a picky eater. Observed meal tray- pt consumed a banana and a half of an English muffin. Per  pt, "I eat a lot of the wrong things". She suspects that she has lactose intolerance and does not tolerate Ensure Enlive well.   Per discussion with MD, likely admission to CIR today.   Medications reviewed and include colace.   Labs reviewed.  NUTRITION - FOCUSED PHYSICAL EXAM:  Flowsheet Row Most Recent Value  Orbital Region No depletion  Upper Arm Region Mild depletion  Thoracic and Lumbar Region No depletion  Buccal Region No depletion  Temple Region No depletion  Clavicle Bone Region No depletion  Clavicle and Acromion Bone Region No depletion  Scapular Bone Region No depletion  Dorsal Hand No depletion  Patellar Region No depletion  Anterior Thigh Region No depletion  Posterior Calf Region No depletion  Edema (RD Assessment) None  Hair Reviewed  Eyes Reviewed  Mouth Reviewed  Skin Reviewed  Nails Reviewed       Diet Order:   Diet Order             Diet - low sodium heart healthy           Diet heart healthy/carb modified Room service appropriate? Yes; Fluid consistency: Thin  Diet effective now                   EDUCATION NEEDS:   No education needs have been identified at this time  Skin:  Skin Assessment: Reviewed RN Assessment  Last BM:  Unknown  Height:   Ht Readings from Last 1 Encounters:  06/19/21 5'  1" (1.549 m)    Weight:   Wt Readings from Last 1 Encounters:  06/19/21 68 kg    Ideal Body Weight:  47.7 kg  BMI:  Body mass index is 28.34 kg/m.  Estimated Nutritional Needs:   Kcal:  1850-2050  Protein:  105-120 grams  Fluid:  > 1.8 L    Loistine Chance, RD, LDN, [place] Registered Dietitian II Certified Diabetes Care and Education Specialist Please refer to [facility] for RD and/or RD on-call/weekend/after hours pager

## 2021-06-24 NOTE — Progress Notes (Signed)
Physical Therapy Treatment Patient Details Name: Carrie Chung MRN: [id number] DOB: 02-08-1941 Today's Date: 06/24/2021    History of Present Illness Pt is an 80 y/o female presenting on 6/30 after a mechanical fall at home with c/o L hip pain. Imaging revealed acute left femoral intertrochanteric fracture, now s/p IM nail on 7/1. PMH includes: CAD, atrial tachycardia, NSTEMI, HTN, HLD, and multiple stent placements.    PT Comments    Pt received in bed, anxious regarding mobility but eager to progress. She required mod assist bed mobility, mod assist sit to stand with RW, and mod assist SPT with RW bed to recliner toward R. Pt unable to tolerate WB through LLE. Pt in recliner with feet elevated to perform LE exercise.    Follow Up Recommendations  CIR     Equipment Recommendations  Other (comment) (defer to next venue of care)    Recommendations for Other Services       Precautions / Restrictions Precautions Precautions: Fall Restrictions LLE Weight Bearing: Weight bearing as tolerated    Mobility  Bed Mobility Overal bed mobility: Needs Assistance Bed Mobility: Supine to Sit     Supine to sit: HOB elevated;Mod assist     General bed mobility comments: +rail, cues for sequencing, increased time; assist with LLE, assist to elevate trunk, use of bed pad to scoot to EOB    Transfers Overall transfer level: Needs assistance Equipment used: Rolling walker (2 wheeled) Transfers: Sit to/from [facility] Sit to Stand: Mod assist;From elevated surface Stand pivot transfers: Mod assist       General transfer comment: increased time to power up and stabilize balance, cues for sequencing and posture; Pt able to pivot on R forefoot with RW support for SPT bed to recliner toward R. Unable to tolerate WB through LLE.  Ambulation/Gait                 Stairs             Wheelchair Mobility    Modified Rankin (Stroke Patients Only)        Balance Overall balance assessment: Independent Sitting-balance support: Feet supported Sitting balance-Leahy Scale: Fair     Standing balance support: During functional activity;Bilateral upper extremity supported Standing balance-Leahy Scale: Poor Standing balance comment: reliant on external assist                            Cognition Arousal/Alertness: Awake/alert Behavior During Therapy: WFL for tasks assessed/performed;Anxious Overall Cognitive Status: Within Functional Limits for tasks assessed                                 General Comments: mildly anxious regarding mobility, can be tangential      Exercises General Exercises - Lower Extremity Ankle Circles/Pumps: AROM;Both;20 reps;Supine Heel Slides: AAROM;Left;10 reps;Supine Hip ABduction/ADduction: AAROM;Left;10 reps;Supine    General Comments General comments (skin integrity, edema, etc.): VSS on RA      Pertinent Vitals/Pain Pain Assessment: Faces Faces Pain Scale: Hurts even more Pain Location: LLE Pain Descriptors / Indicators: Grimacing;Guarding;Moaning Pain Intervention(s): Monitored during session;Limited activity within patient's tolerance;Repositioned;Ice applied    Home Living                      Prior Function            PT Goals (current goals can now  be found in the care plan section) Acute Rehab PT Goals Patient Stated Goal: Return home after going to CIR Progress towards PT goals: Progressing toward goals    Frequency    Min 4X/week      PT Plan Current plan remains appropriate    Co-evaluation              AM-PAC PT "6 Clicks" Mobility   Outcome Measure  Help needed turning from your back to your side while in a flat bed without using bedrails?: A Little Help needed moving from lying on your back to sitting on the side of a flat bed without using bedrails?: A Lot Help needed moving to and from a bed to a chair (including a  wheelchair)?: A Lot Help needed standing up from a chair using your arms (e.g., wheelchair or bedside chair)?: A Lot Help needed to walk in hospital room?: Total Help needed climbing 3-5 steps with a railing? : Total 6 Click Score: 11    End of Session Equipment Utilized During Treatment: Gait belt Activity Tolerance: Patient limited by pain Patient left: in chair;with call bell/phone within reach;with chair alarm set Nurse Communication: Mobility status PT Visit Diagnosis: Other abnormalities of gait and mobility (R26.89);Muscle weakness (generalized) (M62.81);Pain Pain - Right/Left: Left Pain - part of body: Leg     Time: 2449-7530 PT Time Calculation (min) (ACUTE ONLY): 44 min  Charges:  $Gait Training: 8-22 mins $Therapeutic Exercise: 8-22 mins $Therapeutic Activity: 8-22 mins                     Lorrin Goodell, PT  Office # [phone] Pager [phone]    Carrie Chung 06/24/2021, 12:31 PM

## 2021-06-24 NOTE — Progress Notes (Signed)
Inpatient Rehabilitation  Patient information reviewed and entered into eRehab system by Jerrye Seebeck M. Shaiann Mcmanamon, M.A., CCC/SLP, PPS Coordinator.  Information including medical coding, functional ability and quality indicators will be reviewed and updated through discharge.    

## 2021-06-24 NOTE — Progress Notes (Signed)
Pt arrive to 4M06 per bed. Pt has belongings with. Pt oriented to rehab. Policies reviewed, pt in agreement. Vitals obtained, assessment completed. Sheela Stack, LPN

## 2021-06-24 NOTE — Progress Notes (Signed)
Inpatient Rehab Admissions Coordinator:   I have a bed for this patient to admit to CIR today.  Dr. Avon Gully in agreement.  Spoke to pt and her daughter over the phone and both are agreeable to CIR today.  Will let TOC team know planned admit today.   Shann Medal, PT, DPT Admissions Coordinator [phone] 06/24/21  12:14 PM

## 2021-06-25 ENCOUNTER — Inpatient Hospital Stay (HOSPITAL_COMMUNITY): Payer: Medicare Other

## 2021-06-25 DIAGNOSIS — S72142D Displaced intertrochanteric fracture of left femur, subsequent encounter for closed fracture with routine healing: Principal | ICD-10-CM

## 2021-06-25 LAB — CBC WITH DIFFERENTIAL/PLATELET
Abs Immature Granulocytes: 0.04 10*3/uL (ref 0.00–0.07)
Basophils Absolute: 0 10*3/uL (ref 0.0–0.1)
Basophils Relative: 0 %
Eosinophils Absolute: 0.1 10*3/uL (ref 0.0–0.5)
Eosinophils Relative: 2 %
HCT: 24.6 % — ABNORMAL LOW (ref 36.0–46.0)
Hemoglobin: 8.5 g/dL — ABNORMAL LOW (ref 12.0–15.0)
Immature Granulocytes: 1 %
Lymphocytes Relative: 33 %
Lymphs Abs: 1.5 10*3/uL (ref 0.7–4.0)
MCH: 33.9 pg (ref 26.0–34.0)
MCHC: 34.6 g/dL (ref 30.0–36.0)
MCV: 98 fL (ref 80.0–100.0)
Monocytes Absolute: 0.5 10*3/uL (ref 0.1–1.0)
Monocytes Relative: 10 %
Neutro Abs: 2.5 10*3/uL (ref 1.7–7.7)
Neutrophils Relative %: 54 %
Platelets: 187 10*3/uL (ref 150–400)
RBC: 2.51 MIL/uL — ABNORMAL LOW (ref 3.87–5.11)
RDW: 13.6 % (ref 11.5–15.5)
WBC: 4.6 10*3/uL (ref 4.0–10.5)
nRBC: 0.4 % — ABNORMAL HIGH (ref 0.0–0.2)

## 2021-06-25 LAB — COMPREHENSIVE METABOLIC PANEL
ALT: 18 U/L (ref 0–44)
AST: 24 U/L (ref 15–41)
Albumin: 2.7 g/dL — ABNORMAL LOW (ref 3.5–5.0)
Alkaline Phosphatase: 70 U/L (ref 38–126)
Anion gap: 8 (ref 5–15)
BUN: 7 mg/dL — ABNORMAL LOW (ref 8–23)
CO2: 32 mmol/L (ref 22–32)
Calcium: 8.8 mg/dL — ABNORMAL LOW (ref 8.9–10.3)
Chloride: 98 mmol/L (ref 98–111)
Creatinine, Ser: 0.61 mg/dL (ref 0.44–1.00)
GFR, Estimated: >60 mL/min (ref >60)
Glucose, Bld: 115 mg/dL — ABNORMAL HIGH (ref 70–99)
Potassium: 4.1 mmol/L (ref 3.5–5.1)
Sodium: 138 mmol/L (ref 135–145)
Total Bilirubin: 0.8 mg/dL (ref 0.3–1.2)
Total Protein: 5.4 g/dL — ABNORMAL LOW (ref 6.5–8.1)

## 2021-06-25 NOTE — Progress Notes (Signed)
Occupational Therapy Session Note  Patient Details  Name: GIANNE SHUGARS MRN: [id number] Date of Birth: [birth year]  Today's Date: 06/25/2021 OT Individual Time: 0352-4818 OT Individual Time Calculation (min): 56 min    Short Term Goals: Week 1:  OT Short Term Goal 1 (Week 1): Pt will perform BSC/toilet transfer with LRAD MIN A OT Short Term Goal 2 (Week 1): Pt will perform LB dressing sit <> stands level Mod A OT Short Term Goal 3 (Week 1): Pt will perform UB/LB dress with AE PRN with Mod A  Skilled Therapeutic Interventions/Progress Updates:  Pt greeted at time of session sitting up in recliner agreeable to OT session, pain with positional changes with LLE and RN present at beginning of session for 8/10 pain for med pass. Agreeable to Adl since simulated this AM, pt set up at sink in recliner and performed UB/LB bathing seated with pt declining buttocks and past knee level d/t pain but was able to achieve anterior weight shift and FWD flexion to reach sink and part of LE's. Pt did not want to dress this late in the day, donned gown with Min A and stand pivot > bed from recliner with Min/Mod A and RW. Extended time to pivot on RLE.  Sit > supine Max A for LE management and scoot up in bed Min. Note cues for pursed lip breathing as pt tends to hold breath and rest breaks throughout session. Set up with alarm on call bell in reach.  Therapy Documentation Precautions:  Precautions Precautions: Fall Restrictions Weight Bearing Restrictions: Yes LLE Weight Bearing: Weight bearing as tolerated      Therapy/Group: Individual Therapy  Viona Gilmore 06/25/2021, 2:59 PM

## 2021-06-25 NOTE — Progress Notes (Signed)
Patient settling in well. Pain and anxiety medications given with good effect.Due OT/PT evaluation today. Breakfast re-ordered per patients preference. Safety maintained at all times.

## 2021-06-25 NOTE — Evaluation (Signed)
Physical Therapy Assessment and Plan  Patient Details  Name: Carrie Chung MRN: [id number] Date of Birth: 09-04-1941  PT Diagnosis: Abnormality of gait, Difficulty walking, Edema, Muscle weakness, and Pain in joint Rehab Potential: Excellent ELOS: 14-21 days   Today's Date: 06/25/2021 PT Individual Time: 1000-1100 PT Individual Time Calculation (min): 60 min    Hospital Problem: Principal Problem:   Intertrochanteric fracture of left hip [facility])   Past Medical History:  Past Medical History:  Diagnosis Date   Atrial tachycardia ([place])    a. noted at cardiac rehab 5/13; event monitor ordered to assess for AFib   CAD (coronary artery disease)    a. NSTEMI 03/2012 ([place] 03/27/12: pLAD 30%, mLAD 99%, mCFX 95%, mRCA 99% with thrombus, EF 65%);  s/p PTCA/DESx1 to prox-mid RCA 03/27/12 urgently in setting of hypotension/bradycardia, with staged PTCA/Evolve study stent to mid LAD & PTCA/Evolve study stent to prox LCx 03/29/12 ;   echo 03/28/12:EF 60%, Aortic sclerosis without AS, mild RVE, mild reduced RVSF     Endometrial polyp    HTN (hypertension)    Hyperlipidemia    MI, old    Sleep apnea    Past Surgical History:  Past Surgical History:  Procedure Laterality Date   APPENDECTOMY  80 years old   CAROTID STENT  04/2012   x3   CESAREAN SECTION  1984   CHOLECYSTECTOMY  1978   INTRAMEDULLARY (IM) NAIL INTERTROCHANTERIC Left 06/20/2021   Procedure: INTRAMEDULLARY (IM) NAIL INTERTROCHANTRIC;  Surgeon: Renette Butters, MD;  Location: [place];  Service: Orthopedics;  Laterality: Left;   LEFT HEART CATHETERIZATION WITH CORONARY ANGIOGRAM N/A 03/27/2012   Procedure: LEFT HEART CATHETERIZATION WITH CORONARY ANGIOGRAM;  Surgeon: Burnell Blanks, MD;  Location: [facility] CATH LAB;  Service: Cardiovascular;  Laterality: N/A;   PERCUTANEOUS CORONARY STENT INTERVENTION (PCI-S) N/A 03/27/2012   Procedure: PERCUTANEOUS CORONARY STENT INTERVENTION (PCI-S);  Surgeon: Burnell Blanks, MD;  Location[facility] CATH  LAB;  Service: Cardiovascular;  Laterality: N/A;   PERCUTANEOUS CORONARY STENT INTERVENTION (PCI-S) N/A 03/29/2012   Procedure: PERCUTANEOUS CORONARY STENT INTERVENTION (PCI-S);  Surgeon: [name], MD;  Location: [facility] CATH LAB;  Service: Cardiovascular;  Laterality: N/A;   TONSILLECTOMY  80 years old    Assessment & Plan Clinical Impression: Carrie Chung is an 80 year old right-handed female with history of obstructive CAD status post stenting maintained on aspirin and Plavix, atrial tachycardia, hypertension, depression/anxiety, nocturnal oxygen requirement and quit smoking 41 years ago.  Per chart review patient lives with spouse.  Independent prior to admission.  Multilevel home bed and bath upstairs 3 steps to entry.  Husband is limited physically to assist.  She does have a daughter in the area.  Presented 06/19/2021 after mechanical fall landing on left hip without loss of consciousness.  Cranial CT scan negative.  X-rays and imaging revealed acute left femoral intertrochanteric hip fracture.  Status post IM nailing 06/20/2021 per Dr. Percell Miller.  Weightbearing as tolerated left lower extremity.  Patient's chronic aspirin and Plavix has since been resumed postoperatively.  Acute blood loss anemia 8.8 and monitored.  Therapy evaluations completed due to patient's decreased functional mobility was admitted for a comprehensive rehab program. Currently complains of pain at fracture site. Patient transferred to CIR on 06/24/2021 .   Patient currently requires mod with mobility secondary to muscle weakness and decreased standing balance and decreased balance strategies.  Prior to hospitalization, patient was independent  with mobility and lived with Spouse (husband with early stage dementia) in a  House home.  Home access is 3 in front, 4 in garageStairs to enter.  Patient will benefit from skilled PT intervention to maximize safe functional mobility, minimize fall risk, and decrease caregiver burden for  planned discharge home with intermittent assist.  Anticipate patient will  benefit from skilled therapy  at discharge, setting TBD.  PT - End of Session Activity Tolerance: Tolerates 30+ min activity with multiple rests Endurance Deficit: Yes Endurance Deficit Description: Fatigues quicly, SOB after transfer PT Assessment Rehab Potential (ACUTE/IP ONLY): Excellent PT Barriers to Discharge: Inaccessible home environment;Decreased caregiver support;Home environment access/layout;Weight bearing restrictions PT Patient demonstrates impairments in the following area(s): Endurance;Motor;Pain;Sensory;Edema PT Transfers Functional Problem(s): Bed Mobility;Bed to Chair;Car;Furniture PT Locomotion Functional Problem(s): Ambulation;Wheelchair Mobility;Stairs PT Plan PT Intensity: Minimum of 1-2 x/day ,45 to 90 minutes PT Frequency: 5 out of 7 days PT Duration Estimated Length of Stay: 14-21 days PT Treatment/Interventions: Ambulation/gait training;Community reintegration;Discharge planning;DME/adaptive equipment instruction;Pain management;Patient/family education PT Transfers Anticipated Outcome(s): mod I PT Locomotion Anticipated Outcome(s): supervision PT Recommendation Recommendations for Other Services: Therapeutic Recreation consult Therapeutic Recreation Interventions: Stress management;Pet therapy Follow Up Recommendations: Home health PT Patient destination: Home Equipment Recommended: To be determined   PT Evaluation Precautions/Restrictions Precautions Precautions: Fall Restrictions Weight Bearing Restrictions: Yes LLE Weight Bearing: Weight bearing as tolerated General   Vital Signs  Pain Pain Assessment Pain Scale: 0-10 Pain Score: 7  Faces Pain Scale: Hurts even more Pain Type: Surgical pain Pain Location: Hip Pain Orientation: Left Pain Descriptors / Indicators: Aching;Discomfort Home Living/Prior Functioning Home Living Living Arrangements: Spouse/significant  other Available Help at Discharge: Available PRN/intermittently Type of Home: House Home Access: Stairs to enter [employer] of Steps: 3 in front, 4 in garage Entrance Stairs-Rails: None Home Layout: Two level;Bed/bath upstairs;Other (Comment) (per pt able to live on main floor) Alternate Level Stairs-Number of Steps: 14 Alternate Level Stairs-Rails: Right Bathroom Shower/Tub: Chiropodist: Standard Bathroom Accessibility: Yes Additional Comments: caregiver to her husband who has balance impairments and early stage of dementia  Lives With: Spouse (husband with early stage dementia) Prior Function Level of Independence: Independent with gait;Independent with transfers;Independent with homemaking with ambulation;Independent with basic ADLs  Able to Take Stairs?: Yes Driving: Yes Vocation: Retired Comments: pt completely independent prior, managing home, taking care of husband who has balance issues and beginning stages of dementia Vision/Perception  Perception Perception: Within Functional Limits Praxis Praxis: Intact  Cognition Overall Cognitive Status: Within Functional Limits for tasks assessed Arousal/Alertness: Awake/alert Orientation Level: Oriented X4 Attention: Focused;Sustained Focused Attention: Appears intact Sustained Attention: Appears intact Memory: Appears intact Immediate Memory Recall: Sock;Blue;Bed Memory Recall Sock: Without Cue Memory Recall Blue: Without Cue Memory Recall Bed: Without Cue Awareness: Appears intact Problem Solving: Appears intact Safety/Judgment: Appears intact Sensation Sensation Light Touch: Impaired by gross assessment Peripheral sensation comments: Pt reported altered sensation at L lateral knee Light Touch Impaired Details: Impaired LLE Hot/Cold: Appears Intact Proprioception: Appears Intact Coordination Gross Motor Movements are Fluid and Coordinated: No Fine Motor Movements are Fluid and  Coordinated: Yes Coordination and Movement Description: Movement grossly uncoordinated 2/2 pain, muscle guarding present Finger Nose Finger Test: WFL, pt slightly nervous and needed 2 trials to perform Motor  Motor Motor: Other (comment) (limited d/t pain in L hip) Motor - Skilled Clinical Observations: Grossly limited by pain in L hip   Trunk/Postural Assessment  Cervical Assessment Cervical Assessment: Within Functional Limits Thoracic Assessment Thoracic Assessment: Within Functional Limits Lumbar Assessment Lumbar Assessment: Within Functional Limits Postural Control Postural Control:  Within Functional Limits  Balance Balance Balance Assessed: Yes Extremity Assessment      RLE Assessment RLE Assessment: Exceptions to [facility] General Strength Comments: Strength greatly limited 2/2 pain RLE Strength RLE Overall Strength: Deficits;Due to pain Right Hip Flexion: 2-/5 Right Knee Flexion: 2+/5 Right Knee Extension: 3/5 Right Ankle Dorsiflexion: 4+/5 Right Ankle Plantar Flexion: 5/5 LLE Assessment LLE Assessment: Within Functional Limits General Strength Comments: grossly 5/5  Care Tool Care Tool Bed Mobility Roll left and right activity   Roll left and right assist level: Minimal Assistance - Patient > 75%    Sit to lying activity   Sit to lying assist level: Moderate Assistance - Patient 50 - 74%    Lying to sitting edge of bed activity   Lying to sitting edge of bed assist level: Moderate Assistance - Patient 50 - 74%     Care Tool Transfers Sit to stand transfer   Sit to stand assist level: Moderate Assistance - Patient 50 - 74%    Chair/bed transfer   Chair/bed transfer assist level: Minimal Assistance - Patient > 75%     Psychologist, counselling transfer activity did not occur: Safety/medical concerns        Care Tool Locomotion Ambulation Ambulation activity did not occur: Safety/medical concerns        Walk 10 feet activity Walk 10  feet activity did not occur: Safety/medical concerns       Walk 50 feet with 2 turns activity Walk 50 feet with 2 turns activity did not occur: Safety/medical concerns      Walk 150 feet activity Walk 150 feet activity did not occur: Safety/medical concerns      Walk 10 feet on uneven surfaces activity Walk 10 feet on uneven surfaces activity did not occur: Safety/medical concerns      Stairs Stair activity did not occur: Safety/medical concerns        Walk up/down 1 step activity Walk up/down 1 step or curb (drop down) activity did not occur: Safety/medical concerns     Walk up/down 4 steps activity did not occuR: Safety/medical concerns  Walk up/down 4 steps activity      Walk up/down 12 steps activity Walk up/down 12 steps activity did not occur: Safety/medical concerns      Pick up small objects from floor Pick up small object from the floor (from standing position) activity did not occur: Safety/medical concerns      Wheelchair Will patient use wheelchair at discharge?: Yes Type of Wheelchair: Manual Wheelchair activity did not occur: Safety/medical concerns      Wheel 50 feet with 2 turns activity Wheelchair 50 feet with 2 turns activity did not occur: Safety/medical concerns    Wheel 150 feet activity Wheelchair 150 feet activity did not occur: Safety/medical concerns      Refer to Care Plan for Long Term Goals  SHORT TERM GOAL WEEK 1 PT Short Term Goal 1 (Week 1): pt will perform bed/chair transfers with CGA and LRAD PT Short Term Goal 2 (Week 1): Pt will initiate gait training with LRAD PT Short Term Goal 3 (Week 1): Pt will initiate stair training  Recommendations for other services: Therapeutic Recreation  Pet therapy and Stress management  Skilled Therapeutic Intervention Mobility Bed Mobility Bed Mobility: Rolling Right;Supine to Sit;Sit to Supine;Sitting - Scoot to Edge of Bed Rolling Right: Minimal Assistance - Patient > 75% Rolling Left: Minimal  Assistance - Patient > 75% Supine  to Sit: Moderate Assistance - Patient 50-74% Sitting - Scoot to Edge of Bed: Contact Guard/Touching assist Sit to Supine: Moderate Assistance - Patient 50-74%;Maximal Assistance - Patient 25-49% Transfers Transfers: Sit to Stand;Stand to Sit Sit to Stand: Maximal Assistance - Patient 25-49%;Moderate Assistance - Patient 50-74% Stand to Sit: Moderate Assistance - Patient 50-74% Stand Pivot Transfers: Minimal Assistance - Patient > 75% Stand Pivot Transfer Details: Verbal cues for sequencing;Verbal cues for technique;Verbal cues for precautions/safety;Verbal cues for safe use of DME/AE Transfer (Assistive device): Rolling walker Locomotion  Stairs / Additional Locomotion Stairs: No Wheelchair Mobility Wheelchair Mobility: No   Evaluation completed (see details above and below) with education on PT POC and goals and individual treatment initiated with focus on functional mobility and transfers. pt received in bed and agreeable to therapy. Pt reported 6/10 pain at beginning of session, was left with ice pack in place on L hip at end of session. Pt agreeable to sitting EOB, required mod A to lift LLE to EOB and assist with trunk control. Sit to stand with RW required mod A to power up to stand. Pt performed Stand pivot transfer with RW and min A, but had difficulty clearing RLE for safe transfer. Pt severely limited by pain during session (cried out with most mobility), refused to leave room for car transfer, etc. Pt remained seated in recliner after session with all needs in reach.    Discharge Criteria: Patient will be discharged from PT if patient refuses treatment 3 consecutive times without medical reason, if treatment goals not met, if there is a change in medical status, if patient makes no progress towards goals or if patient is discharged from hospital.  The above assessment, treatment plan, treatment alternatives and goals were discussed and mutually  agreed upon: by patient  Mickel Fuchs 06/25/2021, 12:38 PM

## 2021-06-25 NOTE — Progress Notes (Signed)
Inpatient Rehabilitation Care Coordinator Assessment and Plan Patient Details  Name: Carrie Chung MRN: [id number] Date of Birth: 05-Sep-1941  Today's Date: 06/25/2021  Hospital Problems: Principal Problem:   Intertrochanteric fracture of left hip [facility])  Past Medical History:  Past Medical History:  Diagnosis Date   Atrial tachycardia ([place])    a. noted at cardiac rehab 5/13; event monitor ordered to assess for AFib   CAD (coronary artery disease)    a. NSTEMI 03/2012 ([place] 03/27/12: pLAD 30%, mLAD 99%, mCFX 95%, mRCA 99% with thrombus, EF 65%);  s/p PTCA/DESx1 to prox-mid RCA 03/27/12 urgently in setting of hypotension/bradycardia, with staged PTCA/Evolve study stent to mid LAD & PTCA/Evolve study stent to prox LCx 03/29/12 ;   echo 03/28/12:EF 60%, Aortic sclerosis without AS, mild RVE, mild reduced RVSF     Endometrial polyp    HTN (hypertension)    Hyperlipidemia    MI, old    Sleep apnea    Past Surgical History:  Past Surgical History:  Procedure Laterality Date   APPENDECTOMY  80 years old   CAROTID STENT  04/2012   x3   CESAREAN SECTION  1984   CHOLECYSTECTOMY  1978   INTRAMEDULLARY (IM) NAIL INTERTROCHANTERIC Left 06/20/2021   Procedure: INTRAMEDULLARY (IM) NAIL INTERTROCHANTRIC;  Surgeon: Renette Butters, MD;  Location: [place];  Service: Orthopedics;  Laterality: Left;   LEFT HEART CATHETERIZATION WITH CORONARY ANGIOGRAM N/A 03/27/2012   Procedure: LEFT HEART CATHETERIZATION WITH CORONARY ANGIOGRAM;  Surgeon: Burnell Blanks, MD;  Location: [facility] CATH LAB;  Service: Cardiovascular;  Laterality: N/A;   PERCUTANEOUS CORONARY STENT INTERVENTION (PCI-S) N/A 03/27/2012   Procedure: PERCUTANEOUS CORONARY STENT INTERVENTION (PCI-S);  Surgeon: Burnell Blanks, MD;  Location: [facility] CATH LAB;  Service: Cardiovascular;  Laterality: N/A;   PERCUTANEOUS CORONARY STENT INTERVENTION (PCI-S) N/A 03/29/2012   Procedure: PERCUTANEOUS CORONARY STENT INTERVENTION (PCI-S);  Surgeon: [name], MD;  Location: [facility] CATH LAB;  Service: Cardiovascular;  Laterality: N/A;   TONSILLECTOMY  80 years old   Social History:  reports that she quit smoking about 41 years ago. Her smoking use included cigarettes. She has never used smokeless tobacco. She reports current alcohol use of about 20.0 standard drinks of alcohol per week. She reports that she does not use drugs.  Family / Support Systems Marital Status: Married Patient Roles: Spouse, Parent, Caregiver Spouse/Significant Other: Husband has early dementia cares for self Children: Camilla-daughter  [phone]-cell Anticipated Caregiver: Camilla-pt-husband can be there but has health issues-spinal stenosis Ability/Limitations of Caregiver: Daughter works and husband has health issues-early dementia Caregiver Availability: Intermittent Family Dynamics: Close with family they have been here 83 yrs originally from [place]. She has neighbors and friends who will check on her when home  Social History Preferred language: English Religion: Episcopalian Cultural Background: From [place] Education: Nursing degree Read: Yes Write: Yes Employment Status: Retired Public relations account executive Issues: No issues Guardian/Conservator: None-according to MD pt is capable of making her own decisions while here   Abuse/Neglect Abuse/Neglect Assessment Can Be Completed: Yes Physical Abuse: Denies Verbal Abuse: Denies Sexual Abuse: Denies Exploitation of patient/patient's resources: Denies Self-Neglect: Denies  Emotional Status Pt's affect, behavior and adjustment status: Pt is motivated to do for herself and not be much care at discharge. She has always been the caregiver of others and is not used to being in the role of patient Recent Psychosocial Issues: other health issues-on O2 at night Psychiatric History: History of depression/anxiety takes medication for this and finds it  helpful. 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 She talks with the MD and feels her questions have been addressed and answered. Premorbid pt/family roles/activities: Wife caregiver, grandmother, mom, retiree, neighbor etc Anticipated changes in roles/activities/participation: resume Pt/family expectations/goals: Pt states: " I hope to be able to get to the level where I can move on my own, I feel i can figure it out."  [employer]: None Premorbid Home Care/DME Agencies: Other (Comment) (cane and crutches) Transportation available at discharge: Daughter and pt feels she can drive since it is her left hip  Discharge Planning Living Arrangements: Spouse/significant other Support Systems: Spouse/significant other, Children, Friends/neighbors Type of Residence: Private residence Insurance Resources: [employer], Multimedia programmer (specify) Nurse, mental health) Financial Resources: Radio broadcast assistant Screen Referred: No Living Expenses: Own Money Management: Patient Does the patient have any problems obtaining your medications?: No Home Management: Pt Patient/Family Preliminary Plans: Return home with husband who can be there and may hire some assist if needed. Has a long term care policy. Aware being evaluated and goals being set. Pt glad to be here and feels the therapy will benefit her. Care Coordinator Barriers to Discharge: Decreased caregiver support Care Coordinator Anticipated Follow Up Needs: HH/OP  Clinical Impression Pleasant female who is motivated to do well here and be as independent as possible before discharge. Her husband has early dementia and can be there with her, but has spinal stenosis and can not provide physical assist. Their daughter can check on but works and has two young  children. Will await team's evaluations and work on discharge needs.  Elease Hashimoto 06/25/2021, 11:04 AM

## 2021-06-25 NOTE — Progress Notes (Signed)
PROGRESS NOTE   Subjective/Complaints: Slept three hours last night, which she says is good for her. Said night staff here was very good- thank you! Says pain is currently 7-8/10 as she has not received pain medications since last night  ROS: +left hip pain  Objective:   No results found. Recent Labs    06/23/21 1252 06/25/21 0515  WBC 5.2 4.6  HGB 8.8* 8.5*  HCT 25.9* 24.6*  PLT 161 187   Recent Labs    06/25/21 0515  NA 138  K 4.1  CL 98  CO2 32  GLUCOSE 115*  BUN 7*  CREATININE 0.61  CALCIUM 8.8*    Intake/Output Summary (Last 24 hours) at 06/25/2021 1123 Last data filed at 06/25/2021 0900 Gross per 24 hour  Intake 440 ml  Output --  Net 440 ml        Physical Exam: Vital Signs Blood pressure 118/68, pulse 90, temperature 98.7 F (37.1 C), temperature source Oral, resp. rate 18, height 5\' 1"  (1.549 m), weight 73.2 kg, SpO2 94 %. Gen: no distress, normal appearing, husband and daughter at bedside HEENT: oral mucosa pink and moist, NCAT Cardio: Reg rate Chest: normal effort, normal rate of breathing Abd: soft, non-distended Ext: no edema Psych: pleasant, normal affect, anxious about her pain Skin:    Comments: Hip incision is dressed.  Appropriately tender.  Neurological:    Comments: Patient is alert.  No acute distress.  Oriented x3 and follows commands. Strength intact except pain limited in LLE.   Assessment/Plan: 1. Functional deficits which require 3+ hours per day of interdisciplinary therapy in a comprehensive inpatient rehab setting. Physiatrist is providing close team supervision and 24 hour management of active medical problems listed below. Physiatrist and rehab team continue to assess barriers to discharge/monitor patient progress toward functional and medical goals  Care Tool:  Bathing              Bathing assist       Upper Body Dressing/Undressing Upper body dressing    What is the patient wearing?: Hospital gown only    Upper body assist Assist Level: Set up assist    Lower Body Dressing/Undressing Lower body dressing            Lower body assist       Toileting Toileting    Toileting assist Assist for toileting: Maximal Assistance - Patient 25 - 49%     Transfers Chair/bed transfer  Transfers assist  Chair/bed transfer activity did not occur: Safety/medical concerns        Locomotion Ambulation   Ambulation assist              Walk 10 feet activity   Assist           Walk 50 feet activity   Assist           Walk 150 feet activity   Assist           Walk 10 feet on uneven surface  activity   Assist           Wheelchair     Assist  Wheelchair 50 feet with 2 turns activity    Assist            Wheelchair 150 feet activity     Assist          Blood pressure 118/68, pulse 90, temperature 98.7 F (37.1 C), temperature source Oral, resp. rate 18, height 5\' 1"  (1.549 m), weight 73.2 kg, SpO2 94 %.    Medical Problem List and Plan: 1.   Debility secondary to left femoral intertrochanteric hip fracture.  Status post IM nailing 06/20/2021.  Weightbearing as tolerated             -patient may shower but incision must be covered             -ELOS/Goals: 2-3 weeks modI             -Initial CIR evaluations today 2.  Impaired mobility -DVT/anticoagulation: SCDs.  Check vascular study             -antiplatelet therapy: Aspirin 81 mg daily and Plavix 75 mg daily 3. Post-operative left hip pain: Oxycodone and Robaxin as needed. History of negative side effects to gabapentin. Placed order to request that oxycodone be administered 30 minutes prior to therapy sessions 4. Mood: Effexor 75 mg daily, Xanax 1 mg nightly as needed. Discussed risk of respiratory depression with concurrent use of Xanax and oxycodone- she has been tolerating both together.               -antipsychotic agents: N/A 5. Neuropsych: This patient is capable of making decisions on her own behalf. 6. Skin/Wound Care: Routine skin checks 7. Fluids/Electrolytes/Nutrition: Routine in and outs with follow-up chemistries 8.  Acute blood loss anemia.  CBC stable 9.  CAD with history of multiple stents.  Continue aspirin Plavix 10.  Hyperlipidemia.  Continue Crestor 11.  Nocturnal oxygen requirements.  Continue oxygen as prior to admission 12.  Hypertension.  Well controlled, continue Lopressor 25 mg twice daily.  Monitor with increased mobility 13.  Constipation.  Laxative assistance as directed  LOS: 1 days A FACE TO FACE EVALUATION WAS PERFORMED  Rylinn Linzy P Carron Jaggi 06/25/2021, 11:23 AM

## 2021-06-25 NOTE — Evaluation (Signed)
Occupational Therapy Assessment and Plan  Patient Details  Name: Carrie Chung MRN: [id number] Date of Birth: May 27, 1941  OT Diagnosis: abnormal posture, acute pain, muscle weakness (generalized), and L hip fracture w/ IM  nailing Rehab Potential:   ELOS: 18-21 days   Today's Date: 06/25/2021 OT Individual Time: 4010-2725 OT Individual Time Calculation (min): 55 min     Hospital Problem: Principal Problem:   Intertrochanteric fracture of left hip [facility])   Past Medical History:  Past Medical History:  Diagnosis Date   Atrial tachycardia ([place])    a. noted at cardiac rehab 5/13; event monitor ordered to assess for AFib   CAD (coronary artery disease)    a. NSTEMI 03/2012 ([place] 03/27/12: pLAD 30%, mLAD 99%, mCFX 95%, mRCA 99% with thrombus, EF 65%);  s/p PTCA/DESx1 to prox-mid RCA 03/27/12 urgently in setting of hypotension/bradycardia, with staged PTCA/Evolve study stent to mid LAD & PTCA/Evolve study stent to prox LCx 03/29/12 ;   echo 03/28/12:EF 60%, Aortic sclerosis without AS, mild RVE, mild reduced RVSF     Endometrial polyp    HTN (hypertension)    Hyperlipidemia    MI, old    Sleep apnea    Past Surgical History:  Past Surgical History:  Procedure Laterality Date   APPENDECTOMY  80 years old   CAROTID STENT  04/2012   x3   CESAREAN SECTION  1984   CHOLECYSTECTOMY  1978   INTRAMEDULLARY (IM) NAIL INTERTROCHANTERIC Left 06/20/2021   Procedure: INTRAMEDULLARY (IM) NAIL INTERTROCHANTRIC;  Surgeon: Renette Butters, MD;  Location: [place];  Service: Orthopedics;  Laterality: Left;   LEFT HEART CATHETERIZATION WITH CORONARY ANGIOGRAM N/A 03/27/2012   Procedure: LEFT HEART CATHETERIZATION WITH CORONARY ANGIOGRAM;  Surgeon: Burnell Blanks, MD;  Location: [facility] CATH LAB;  Service: Cardiovascular;  Laterality: N/A;   PERCUTANEOUS CORONARY STENT INTERVENTION (PCI-S) N/A 03/27/2012   Procedure: PERCUTANEOUS CORONARY STENT INTERVENTION (PCI-S);  Surgeon: Burnell Blanks, MD;  Location:  [facility] CATH LAB;  Service: Cardiovascular;  Laterality: N/A;   PERCUTANEOUS CORONARY STENT INTERVENTION (PCI-S) N/A 03/29/2012   Procedure: PERCUTANEOUS CORONARY STENT INTERVENTION (PCI-S);  Surgeon: [name], MD;  Location: [facility] CATH LAB;  Service: Cardiovascular;  Laterality: N/A;   TONSILLECTOMY  80 years old    Assessment & Plan Clinical Impression: Carrie Chung is an 80 year old right-handed female with history of obstructive CAD status post stenting maintained on aspirin and Plavix, atrial tachycardia, hypertension, depression/anxiety, nocturnal oxygen requirement and quit smoking 41 years ago.  Per chart review patient lives with spouse.  Independent prior to admission.  Multilevel home bed and bath upstairs 3 steps to entry.  Husband is limited physically to assist.  She does have a daughter in the area.  Presented 06/19/2021 after mechanical fall landing on left hip without loss of consciousness.  Cranial CT scan negative.  X-rays and imaging revealed acute left femoral intertrochanteric hip fracture.  Status post IM nailing 06/20/2021 per Dr. Percell Miller.  Weightbearing as tolerated left lower extremity.  Patient's chronic aspirin and Plavix has since been resumed postoperatively.  Acute blood loss anemia 8.8 and monitored.  Therapy evaluations completed due to patient's decreased functional mobility was admitted for a comprehensive rehab program. Currently complains of pain at fracture site. Patient transferred to CIR on 06/24/2021 .    Patient currently requires max with basic self-care skills secondary to muscle weakness, decreased cardiorespiratoy endurance, impaired timing and sequencing, decreased coordination, and decreased motor planning, and decreased sitting balance, decreased standing balance, decreased  postural control, and decreased balance strategies.  Prior to hospitalization, patient could complete BADL and IADL  with independent .  Patient will benefit from skilled intervention to  decrease level of assist with basic self-care skills and increase independence with basic self-care skills prior to discharge home with care partner.  Anticipate patient will require intermittent supervision and follow up home health.  OT - End of Session Activity Tolerance: Tolerates 10 - 20 min activity with multiple rests Endurance Deficit: Yes Endurance Deficit Description: Fatigues quicly, SOB after sit <> stands OT Assessment OT Barriers to Discharge: Decreased caregiver support;Home environment access/layout OT Patient demonstrates impairments in the following area(s): Balance;Behavior;Edema;Endurance;Pain;Motor;Safety;Sensory OT Basic ADL's Functional Problem(s): Grooming;Bathing;Dressing;Toileting OT Transfers Functional Problem(s): Toilet;Tub/Shower OT Additional Impairment(s): None OT Plan OT Intensity: Minimum of 1-2 x/day, 45 to 90 minutes OT Frequency: 5 out of 7 days OT Duration/Estimated Length of Stay: 18-21 days OT Treatment/Interventions: Balance/vestibular training;Discharge planning;Functional electrical stimulation;Pain management;Self Care/advanced ADL retraining;Therapeutic Activities;UE/LE Coordination activities;Cognitive remediation/compensation;Disease mangement/prevention;Functional mobility training;Patient/family education;Skin care/wound managment;Therapeutic Exercise;Visual/perceptual remediation/compensation;Community reintegration;DME/adaptive equipment instruction;Neuromuscular re-education;Psychosocial support;Splinting/orthotics;UE/LE Strength taining/ROM;Wheelchair propulsion/positioning OT Self Feeding Anticipated Outcome(s): no goal OT Basic Self-Care Anticipated Outcome(s): Supervision OT Toileting Anticipated Outcome(s): Mod I OT Bathroom Transfers Anticipated Outcome(s): Mod I toilet, Supervision shower OT Recommendation Patient destination: Home Follow Up Recommendations: Home health OT Equipment Recommended: 3 in 1 bedside comode;Tub/shower  bench;To be determined   OT Evaluation Precautions/Restrictions  Precautions Precautions: Fall Restrictions Weight Bearing Restrictions: Yes LLE Weight Bearing: Weight bearing as tolerated Pain Pain Assessment Pain Scale: 0-10 Pain Score: 7  Faces Pain Scale: Hurts even more Pain Type: Surgical pain Pain Location: Hip Pain Orientation: Left Pain Descriptors / Indicators: Aching;Discomfort Home Living/Prior Functioning Home Living Family/patient expects to be discharged to:: Private residence Living Arrangements: Spouse/significant other Available Help at Discharge: Available PRN/intermittently Type of Home: House Home Access: Stairs to enter [employer] of Steps: 3 in front, 4 in garage Entrance Stairs-Rails: None Home Layout: Two level, Bed/bath upstairs, Other (Comment) (per pt able to live on main floor) Alternate Level Stairs-Number of Steps: 14 Alternate Level Stairs-Rails: Right Bathroom Shower/Tub: Government social research officer Accessibility: Yes Additional Comments: caregiver to her husband who has balance impairments and early stage of dementia  Lives With: Spouse (husband with early stage dementia) Prior Function Level of Independence: Independent with gait, Independent with transfers, Independent with homemaking with ambulation, Independent with basic ADLs  Able to Take Stairs?: Yes Driving: Yes Vocation: Retired Comments: pt completely independent prior, managing home, taking care of husband who has balance issues and beginning stages of dementia Vision Baseline Vision/History: No visual deficits Patient Visual Report: No change from baseline Perception  Perception: Within Functional Limits Praxis Praxis: Intact Cognition Overall Cognitive Status: Within Functional Limits for tasks assessed Arousal/Alertness: Awake/alert Orientation Level: Person;Place;Situation Person: Oriented Place: Oriented Situation:  Oriented Year: 2022 Month: July Day of Week: Correct Memory: Appears intact Immediate Memory Recall: Sock;Blue;Bed Memory Recall Sock: Without Cue Memory Recall Blue: Without Cue Memory Recall Bed: Without Cue Attention: Focused;Sustained Focused Attention: Appears intact Sustained Attention: Appears intact Awareness: Appears intact Problem Solving: Appears intact Safety/Judgment: Appears intact Sensation Sensation Light Touch: Impaired by gross assessment Peripheral sensation comments: Pt reported altered sensation at L lateral knee Light Touch Impaired Details: Impaired LLE Hot/Cold: Appears Intact Proprioception: Appears Intact Coordination Gross Motor Movements are Fluid and Coordinated: No Fine Motor Movements are Fluid and Coordinated: Yes Coordination and Movement Description: Movement grossly uncoordinated 2/2 pain, muscle guarding present Finger Nose Finger  Test: [facility], pt slightly nervous and needed 2 trials to perform Motor  Motor Motor: Other (comment) (limited d/t pain in L hip) Motor - Skilled Clinical Observations: Grossly limited by pain in L hip  Trunk/Postural Assessment  Cervical Assessment Cervical Assessment: Within Functional Limits Thoracic Assessment Thoracic Assessment: Exceptions to [facility] (rounded shoulders 2/2 fatigue) Lumbar Assessment Lumbar Assessment: Within Functional Limits Postural Control Postural Control: Deficits on evaluation  Balance Balance Balance Assessed: Yes Static Sitting Balance Static Sitting - Balance Support: Feet supported;Bilateral upper extremity supported Static Sitting - Level of Assistance: 5: Stand by assistance Dynamic Sitting Balance Dynamic Sitting - Balance Support: Feet supported;Left upper extremity supported Dynamic Sitting - Level of Assistance: 4: Min assist Dynamic Sitting - Balance Activities: Lateral lean/weight shifting;Forward lean/weight shifting Static Standing Balance Static Standing - Balance  Support: During functional activity;Bilateral upper extremity supported Static Standing - Level of Assistance: 4: Min assist Extremity/Trunk Assessment RUE Assessment RUE Assessment: Within Functional Limits LUE Assessment LUE Assessment: Within Functional Limits  Care Tool Care Tool Self Care Eating    Set up    Oral Care    Oral Care Assist Level: Set up assist    Bathing   Body parts bathed by patient: Right arm;Left arm;Chest;Abdomen;Right upper leg;Left upper leg;Face (simulated d/t pain) Body parts bathed by helper: Buttocks;Right lower leg;Left lower leg;Front perineal area   Assist Level: Maximal Assistance - Patient 24 - 49%    Upper Body Dressing(including orthotics)   What is the patient wearing?: Hospital gown only   Assist Level: Minimal Assistance - Patient > 75%    Lower Body Dressing (excluding footwear)   What is the patient wearing?: Pants Assist for lower body dressing: Total Assistance - Patient < 25%    Putting on/Taking off footwear   What is the patient wearing?: Non-skid slipper socks Assist for footwear: Dependent - Patient 0%       Care Tool Toileting Toileting activity   Assist for toileting: Total Assistance - Patient < 25%     Care Tool Bed Mobility Roll left and right activity   Roll left and right assist level: Minimal Assistance - Patient > 75%    Sit to lying activity   Sit to lying assist level: Moderate Assistance - Patient 50 - 74%    Lying to sitting edge of bed activity   Lying to sitting edge of bed assist level: Moderate Assistance - Patient 50 - 74%     Care Tool Transfers Sit to stand transfer   Sit to stand assist level: Moderate Assistance - Patient 50 - 74%    Chair/bed transfer   Chair/bed transfer assist level: Minimal Assistance - Patient > 75%     Toilet transfer    Mod A     Care Tool Cognition Expression of Ideas and Wants Expression of Ideas and Wants: Without difficulty (complex and basic) - expresses  complex messages without difficulty and with speech that is clear and easy to understand   Understanding Verbal and Non-Verbal Content Understanding Verbal and Non-Verbal Content: Understands (complex and basic) - clear comprehension without cues or repetitions   Memory/Recall Ability *first 3 days only Memory/Recall Ability *first 3 days only: Current season;Staff names and faces;That he or she is in a hospital/hospital unit    Refer to Care Plan for [place] 1 OT Short Term Goal 1 (Week 1): Pt will perform BSC/toilet transfer with LRAD MIN A OT Short Term Goal 2 (Week  1): Pt will perform LB dressing sit <> stands level Mod A OT Short Term Goal 3 (Week 1): Pt will perform UB/LB dress with AE PRN with Mod A  Recommendations for other services: None    Skilled Therapeutic Intervention ADL ADL Eating: Not assessed Grooming: Supervision/safety Upper Body Bathing: Minimal assistance (simulated) Lower Body Bathing: Maximal assistance Upper Body Dressing: Minimal assistance Lower Body Dressing: Dependent Toileting: Dependent Toilet Transfer: Not assessed (NA d/t pain) Mobility  Bed Mobility Bed Mobility: Rolling Right;Supine to Sit;Sit to Supine;Sitting - Scoot to Edge of Bed Rolling Right: Minimal Assistance - Patient > 75% Rolling Left: Minimal Assistance - Patient > 75% Supine to Sit: Moderate Assistance - Patient 50-74% Sitting - Scoot to Edge of Bed: Contact Guard/Touching assist Sit to Supine: Moderate Assistance - Patient 50-74%;Maximal Assistance - Patient 25-49% Transfers Sit to Stand: Maximal Assistance - Patient 25-49%;Moderate Assistance - Patient 50-74% Stand to Sit: Moderate Assistance - Patient 50-74%   Skilled Intervention: Pt greeted at time of session semireclined in bed with family in room who did not remain in room for eval. Discussed with pt role and purpose of OT and agreeable. Note pt very anxious/nervous throughout session for  pain, extended time needed for all tasks d/t pain with transitional movements and muscle guarding. ADL simulated d/t time and pain restrictions. Pt had not gotten pain meds this am, RN providing partially through session. Bed mobility Mod/Max for supine <> sit and sitting EOB focused on RLE movements including knee flexion/extension and ankle PF/DF as preparatory activity. Note therapist retrieved manual wheelchair with ELR at beginning of session while RN performing med pass. Sit <> stands with Mod/Max A to achieve with cues for form, and in standing approx 1 minute focusing on form, standing balance, and being able to place full foot on floor including heel. Scooting up in bed Mod/Max A. Alarm on call bell in reach.     Discharge Criteria: Patient will be discharged from OT if patient refuses treatment 3 consecutive times without medical reason, if treatment goals not met, if there is a change in medical status, if patient makes no progress towards goals or if patient is discharged from hospital.  The above assessment, treatment plan, treatment alternatives and goals were discussed and mutually agreed upon: by patient  Viona Gilmore 06/25/2021, 12:45 PM

## 2021-06-25 NOTE — Plan of Care (Signed)
  Problem: RH Balance Goal: LTG Patient will maintain dynamic standing with ADLs (OT) Description: LTG:  Patient will maintain dynamic standing balance with assist during activities of daily living (OT)  Flowsheets (Taken 06/25/2021 1255) LTG: Pt will maintain dynamic standing balance during ADLs with: Independent with assistive device   Problem: Sit to Stand Goal: LTG:  Patient will perform sit to stand in prep for activites of daily living with assistance level (OT) Description: LTG:  Patient will perform sit to stand in prep for activites of daily living with assistance level (OT) Flowsheets (Taken 06/25/2021 1255) LTG: PT will perform sit to stand in prep for activites of daily living with assistance level: Independent with assistive device   Problem: RH Grooming Goal: LTG Patient will perform grooming w/assist,cues/equip (OT) Description: LTG: Patient will perform grooming with assist, with/without cues using equipment (OT) Flowsheets (Taken 06/25/2021 1255) LTG: Pt will perform grooming with assistance level of: Independent with assistive device    Problem: RH Bathing Goal: LTG Patient will bathe all body parts with assist levels (OT) Description: LTG: Patient will bathe all body parts with assist levels (OT) Flowsheets (Taken 06/25/2021 1255) LTG: Pt will perform bathing with assistance level/cueing: Supervision/Verbal cueing   Problem: RH Dressing Goal: LTG Patient will perform upper body dressing (OT) Description: LTG Patient will perform upper body dressing with assist, with/without cues (OT). Flowsheets (Taken 06/25/2021 1255) LTG: Pt will perform upper body dressing with assistance level of: Set up assist Goal: LTG Patient will perform lower body dressing w/assist (OT) Description: LTG: Patient will perform lower body dressing with assist, with/without cues in positioning using equipment (OT) Flowsheets (Taken 06/25/2021 1255) LTG: Pt will perform lower body dressing with assistance  level of: Supervision/Verbal cueing   Problem: RH Toileting Goal: LTG Patient will perform toileting task (3/3 steps) with assistance level (OT) Description: LTG: Patient will perform toileting task (3/3 steps) with assistance level (OT)  Flowsheets (Taken 06/25/2021 1255) LTG: Pt will perform toileting task (3/3 steps) with assistance level: Independent with assistive device   Problem: RH Toilet Transfers Goal: LTG Patient will perform toilet transfers w/assist (OT) Description: LTG: Patient will perform toilet transfers with assist, with/without cues using equipment (OT) Flowsheets (Taken 06/25/2021 1255) LTG: Pt will perform toilet transfers with assistance level of: Independent with assistive device   Problem: RH Tub/Shower Transfers Goal: LTG Patient will perform tub/shower transfers w/assist (OT) Description: LTG: Patient will perform tub/shower transfers with assist, with/without cues using equipment (OT) Flowsheets (Taken 06/25/2021 1255) LTG: Pt will perform tub/shower stall transfers with assistance level of: Supervision/Verbal cueing

## 2021-06-25 NOTE — Progress Notes (Signed)
[place] Individual Statement of Services  Patient Name:  Carrie Chung  Date:  06/25/2021  Welcome to the [facility].  Our goal is to provide you with an individualized program based on your diagnosis and situation, designed to meet your specific needs.  With this comprehensive rehabilitation program, you will be expected to participate in at least 3 hours of rehabilitation therapies Monday-Friday, with modified therapy programming on the weekends.  Your rehabilitation program will include the following services:  Physical Therapy (PT), Occupational Therapy (OT), 24 hour per day rehabilitation nursing, Care Coordinator, Rehabilitation Medicine, Nutrition Services, and Pharmacy Services  Weekly team conferences will be held on Tuesday to discuss your progress.  Your Inpatient Rehabilitation Care Coordinator will talk with you frequently to get your input and to update you on team discussions.  Team conferences with you and your family in attendance may also be held.  Expected length of stay: 16-20 days  Overall anticipated outcome: supervision with cueing  Depending on your progress and recovery, your program may change. Your Inpatient Rehabilitation Care Coordinator will coordinate services and will keep you informed of any changes. Your Inpatient Rehabilitation Care Coordinator's name and contact numbers are listed  below.  The following services may also be recommended but are not provided by the [place] will be made to provide these services after discharge if needed.  Arrangements include referral to agencies that provide these services.  Your insurance has been verified to be:  [employer] Your primary doctor is:  Tedra Senegal  Pertinent information will be shared with your doctor and your insurance  company.  Inpatient Rehabilitation Care Coordinator:  Ovidio Kin, [place] or Emilia Beck  Information discussed with and copy given to patient by: Elease Hashimoto, 06/25/2021, 11:06 AM

## 2021-06-25 NOTE — Discharge Instructions (Addendum)
Inpatient Rehab Discharge Instructions  Carrie Chung Discharge date and time: No discharge date for patient encounter.   Activities/Precautions/ Functional Status: Activity: activity as tolerated Diet: regular diet Wound Care: Routine skin checks Functional status:  ___ No restrictions     ___ Walk up steps independently ___ 24/7 supervision/assistance   ___ Walk up steps with assistance ___ Intermittent supervision/assistance  ___ Bathe/dress independently ___ Walk with walker     __x_ Bathe/dress with assistance ___ Walk Independently    ___ Shower independently ___ Walk with assistance    ___ Shower with assistance ___ No alcohol     ___ Return to work/school ________  Special Instructions: No driving smoking or alcohol  Continue supplemental oxygen as prior to admission.    COMMUNITY REFERRALS UPON DISCHARGE:    HOME HEALTH: [facility]-PT NUMBER: [phone]  Outpatient: PT             Agency:[facility] Phone:[phone]             Appointment Date/Time:WILL CALL TO SET UP APPOINTMENTS  Medical Equipment/Items Ordered:YOUTH ROLLING WALKER & 3 IN 1, TRANSPORT CHAIR WILL GET TUB BENCH ON OWN                                                 Agency/Supplier:[facility]  [phone]   My questions have been answered and I understand these instructions. I will adhere to these goals and the provided educational materials after my discharge from the hospital.  Patient/Caregiver Signature _______________________________ Date __________  Clinician Signature _______________________________________ Date __________  Please bring this form and your medication list with you to all your follow-up doctor's appointments.

## 2021-06-26 ENCOUNTER — Inpatient Hospital Stay (HOSPITAL_COMMUNITY): Payer: Medicare Other

## 2021-06-26 DIAGNOSIS — M7989 Other specified soft tissue disorders: Secondary | ICD-10-CM

## 2021-06-26 MED ORDER — ALPRAZOLAM 0.5 MG PO TABS
1.0000 mg | ORAL_TABLET | Freq: Every day | ORAL | Status: DC
  Administered 2021-06-26 – 2021-07-07 (×12): 1 mg via ORAL
  Filled 2021-06-26 (×12): qty 2

## 2021-06-26 MED ORDER — ALPRAZOLAM 0.5 MG PO TABS
1.0000 mg | ORAL_TABLET | Freq: Once | ORAL | Status: AC
  Administered 2021-06-26: 1 mg via ORAL
  Filled 2021-06-26: qty 2

## 2021-06-26 NOTE — Progress Notes (Signed)
PROGRESS NOTE   Subjective/Complaints: C/o nausea this morning. Says she did not take Xanax last night and she usually does every night, asks if this may be why. Also notes tremors. Ordered 1x Xanax and scheduled Xanax HS.   ROS: +left hip pain, +anxiety  Objective:   No results found. Recent Labs    06/23/21 1252 06/25/21 0515  WBC 5.2 4.6  HGB 8.8* 8.5*  HCT 25.9* 24.6*  PLT 161 187   Recent Labs    06/25/21 0515  NA 138  K 4.1  CL 98  CO2 32  GLUCOSE 115*  BUN 7*  CREATININE 0.61  CALCIUM 8.8*    Intake/Output Summary (Last 24 hours) at 06/26/2021 1204 Last data filed at 06/26/2021 0700 Gross per 24 hour  Intake 600 ml  Output --  Net 600 ml        Physical Exam: Vital Signs Blood pressure (!) 153/66, pulse 90, temperature 98.6 F (37 C), temperature source Oral, resp. rate 16, height 5\' 1"  (1.549 m), weight 73.2 kg, SpO2 99 %. Gen: no distress, normal appearing HEENT: oral mucosa pink and moist, NCAT Cardio: Reg rate Chest: normal effort, normal rate of breathing Abd: soft, non-distended Ext: no edema Psych: pleasant, normal affect, anxious about her pain Skin:    Comments: Hip incision is dressed.  Appropriately tender.  Neurological:    Comments: Patient is alert.  No acute distress.  Oriented x3 and follows commands. Strength intact except pain limited in LLE.   Assessment/Plan: 1. Functional deficits which require 3+ hours per day of interdisciplinary therapy in a comprehensive inpatient rehab setting. Physiatrist is providing close team supervision and 24 hour management of active medical problems listed below. Physiatrist and rehab team continue to assess barriers to discharge/monitor patient progress toward functional and medical goals  Care Tool:  Bathing    Body parts bathed by patient: Right arm, Left arm, Chest, Abdomen, Right upper leg, Left upper leg, Face (simulated d/t pain)    Body parts bathed by helper: Buttocks, Right lower leg, Left lower leg, Front perineal area     Bathing assist Assist Level: Maximal Assistance - Patient 24 - 49%     Upper Body Dressing/Undressing Upper body dressing   What is the patient wearing?: Hospital gown only    Upper body assist Assist Level: Minimal Assistance - Patient > 75%    Lower Body Dressing/Undressing Lower body dressing      What is the patient wearing?: Pants     Lower body assist Assist for lower body dressing: Total Assistance - Patient < 25%     Toileting Toileting Toileting Activity did not occur Landscape architect and hygiene only):  (Patient voided in brief and had stool)  Toileting assist Assist for toileting: Moderate Assistance - Patient 50 - 74%     Transfers Chair/bed transfer  Transfers assist  Chair/bed transfer activity did not occur: Safety/medical concerns  Chair/bed transfer assist level: Minimal Assistance - Patient > 75%     Locomotion Ambulation   Ambulation assist   Ambulation activity did not occur: Safety/medical concerns          Walk 10 feet activity  Assist  Walk 10 feet activity did not occur: Safety/medical concerns        Walk 50 feet activity   Assist Walk 50 feet with 2 turns activity did not occur: Safety/medical concerns         Walk 150 feet activity   Assist Walk 150 feet activity did not occur: Safety/medical concerns         Walk 10 feet on uneven surface  activity   Assist Walk 10 feet on uneven surfaces activity did not occur: Safety/medical concerns         Wheelchair     Assist Will patient use wheelchair at discharge?: Yes Type of Wheelchair: Manual Wheelchair activity did not occur: Safety/medical concerns         Wheelchair 50 feet with 2 turns activity    Assist    Wheelchair 50 feet with 2 turns activity did not occur: Safety/medical concerns       Wheelchair 150 feet activity      Assist  Wheelchair 150 feet activity did not occur: Safety/medical concerns       Blood pressure (!) 153/66, pulse 90, temperature 98.6 F (37 C), temperature source Oral, resp. rate 16, height 5\' 1"  (1.549 m), weight 73.2 kg, SpO2 99 %.    Medical Problem List and Plan: 1.   Debility secondary to left femoral intertrochanteric hip fracture.  Status post IM nailing 06/20/2021.  Weightbearing as tolerated             -patient may shower but incision must be covered             -ELOS/Goals: 2-3 weeks modI             -Continue CIR 2.  Impaired mobility: continue SCDs.               -antiplatelet therapy: Aspirin 81 mg daily and Plavix 75 mg daily 3. Post-operative left hip pain: Continue Oxycodone and Robaxin as needed. History of negative side effects to gabapentin. Placed order to request that oxycodone be administered 30 minutes prior to therapy sessions 4. Anxiety: Effexor 75 mg daily, Xanax 1 mg- changed to scheduled. Discussed risk of respiratory depression with concurrent use of Xanax and oxycodone- she has been tolerating both together.              -antipsychotic agents: N/A 5. Neuropsych: This patient is capable of making decisions on her own behalf. 6. Skin/Wound Care: Routine skin checks 7. Fluids/Electrolytes/Nutrition: Routine in and outs with follow-up chemistries 8.  Acute blood loss anemia.  CBC stable. Continue to monitor weekly.  9.  CAD with history of multiple stents.  Continue aspirin Plavix 10.  Hyperlipidemia.  Continue Crestor 11.  Nocturnal oxygen requirements.  Continue oxygen as prior to admission 12.  Hypertension.  Well controlled, continue Lopressor 25 mg twice daily.  Monitor with increased mobility 13.  Constipation.  Laxative assistance as directed  LOS: 2 days A FACE TO FACE EVALUATION WAS PERFORMED  Martha Clan P Carmon Sahli 06/26/2021, 12:04 PM

## 2021-06-26 NOTE — Progress Notes (Signed)
Occupational Therapy Session Note  Patient Details  Name: Carrie Chung MRN: [id number] Date of Birth: July 08, 1941  Today's Date: 06/26/2021 OT Individual Time: [phone] and [id number] OT Individual Time Calculation (min): 57 min and 73 min   Short Term Goals: Week 1:  OT Short Term Goal 1 (Week 1): Pt will perform BSC/toilet transfer with LRAD MIN A OT Short Term Goal 2 (Week 1): Pt will perform LB dressing sit <> stands level Mod A OT Short Term Goal 3 (Week 1): Pt will perform UB/LB dress with AE PRN with Mod A  Skilled Therapeutic Interventions/Progress Updates:    Pt greeted at time of session on bed pan with nursing, finishing up med pass as well. After med pass, pt bridging bed level with RLE mostly to lift buttocks off bed for bed pan to come out, nursing assisting with hygiene. Pt agreeable to "try" to participate in OT despite nausea and pain (just got meds) and initially became emotional d/t overwhelming feelings with her pain, away from family, etc and emotional support provided. Preparatory activity for ROM for LLE for knee flexion/slides on bed surface as a warm up 1x10. Supine > sit with Mod A, improvement with tolerance for moving LLE and less pain compared to yesterday. Sit > stand Mod A at RW and cues for form, stand pivot to R to bariatric BSC (obtained during med pass) for comfort as larger surface area to decrease pain at hip, transfer with Min A. Mod A toileting overall, pt able to perform pericare with lateral lean. LLE propped up on trashcan during toileting. Stand pivot back to bed Min A with RW and cues for step pattern. Sit > supine Mod/Max alarm on call bell in reach.   Session 2: Pt greeted at time of session sitting up in recliner agreeable to OT session, pain with transitional movements but improved with rest and left with ice pack later on. Ambulated short distance recliner > wheelchair approx 7-10 feet with RW and able to achieve full contact of L foot on ground  with minimal WB through it. Mod A for power up but ambulated CGA. Set up at sink and therapist assisted with therapeutic hair washing, pt grooming and drying with hair dryer for UB endurance and ROM. Ambulated chair > bed same as above and sit > supine trialing laying on R side first, bringing feet up to bed surface, then rolling on to back in parts instead of quick movements with some improvement in pain. Set up with fresh ice pack on L hip and alarm on call bell in reach.     Therapy Documentation Precautions:  Precautions Precautions: Fall Restrictions Weight Bearing Restrictions: Yes LLE Weight Bearing: Weight bearing as tolerated     Therapy/Group: Individual Therapy  Viona Gilmore 06/26/2021, 7:16 AM

## 2021-06-26 NOTE — Progress Notes (Signed)
Physical Therapy Session Note  Patient Details  Name: Carrie Chung MRN: [id number] Date of Birth: 12/21/1941  Today's Date: 06/26/2021 PT Individual Time: 1100-1200 PT Individual Time Calculation (min): 60 min   Short Term Goals: Week 1:  PT Short Term Goal 1 (Week 1): pt will perform bed/chair transfers with CGA and LRAD PT Short Term Goal 2 (Week 1): Pt will initiate gait training with LRAD PT Short Term Goal 3 (Week 1): Pt will initiate stair training  Skilled Therapeutic Interventions/Progress Updates:    pt received in bed and agreeable to therapy. Pt reported 3/10 pain at rest, reported 7/10 pain with activity and was left with ice pack in place at end of session. Pt demonstrated fewer external signs/complaints of pain throughout session. Supine>sit with CGA, use of bed rail and increased time. Sit to stand with RW and min A for technique cues. Stand pivot transfer  to w/c with RW and min A for technique cues. Pt then propelled w/c x134 ft with BUE and supervision to therapy gym. Pt expressed improved self efficacy with being able to push herself. Pt then performed car transfer with mod A for to lift LLE and provide cueing for technique. Pt very winded after getting into car and required rest break before getting out of car. Pt then performed ambulatory transfer to w/c x 10 ft with min A and RW. Pt demonstrates difficulty bearing weight on LLE, but did improve with repetition. Pt propelled w/c back to room x 134 ft and performed ambulatory transfer to recliner with min A. Pt remained seated in recliner at end of session with all needs in reach.    Therapy Documentation Precautions:  Precautions Precautions: Fall Restrictions Weight Bearing Restrictions: Yes LLE Weight Bearing: Weight bearing as tolerated    Therapy/Group: Individual Therapy  Mickel Fuchs 06/26/2021, 12:10 PM

## 2021-06-27 NOTE — Progress Notes (Signed)
PROGRESS NOTE   Subjective/Complaints: Slept much better last night with Xanax Pain has been relatively well controlled She is in positive mood No more nausea today  ROS: +left hip pain, +anxiety, +insomnia  Objective:   VAS [place] LOWER EXTREMITY VENOUS (DVT)  Result Date: 06/26/2021  Lower Venous DVT Study Patient Name:  Carrie Chung  Date of Exam:   06/26/2021 Medical Rec #: [id number]           Accession #:    [id number] Date of Birth: Aug 15, 1941           Patient Gender: F Patient Age:   79Y Exam Location:  [facility] Procedure:      VAS [place] LOWER EXTREMITY VENOUS (DVT) Referring Phys: Lu [place] --------------------------------------------------------------------------------  Indications: Swelling. Other Indications: Left femoral intertrochanteric hip fracture. Comparison Study: No recent - 5/15 Performing Technologist: Oda Cogan RDMS, RVT  Examination Guidelines: A complete evaluation includes B-mode imaging, spectral Doppler, color Doppler, and power Doppler as needed of all accessible portions of each vessel. Bilateral testing is considered an integral part of a complete examination. Limited examinations for reoccurring indications may be performed as noted. The reflux portion of the exam is performed with the patient in reverse Trendelenburg.  +---------+---------------+---------+-----------+----------+--------------+ RIGHT    CompressibilityPhasicitySpontaneityPropertiesThrombus Aging +---------+---------------+---------+-----------+----------+--------------+ CFV      Full           Yes      Yes                                 +---------+---------------+---------+-----------+----------+--------------+ SFJ      Full                                                        +---------+---------------+---------+-----------+----------+--------------+ FV Prox  Full                                                         +---------+---------------+---------+-----------+----------+--------------+ FV Mid   Full                                                        +---------+---------------+---------+-----------+----------+--------------+ FV DistalFull                                                        +---------+---------------+---------+-----------+----------+--------------+ PFV      Full                                                        +---------+---------------+---------+-----------+----------+--------------+  POP      Full           Yes      Yes                                 +---------+---------------+---------+-----------+----------+--------------+ PTV      Full                                                        +---------+---------------+---------+-----------+----------+--------------+ PERO     Full                                                        +---------+---------------+---------+-----------+----------+--------------+   +---------+---------------+---------+-----------+----------+--------------+ LEFT     CompressibilityPhasicitySpontaneityPropertiesThrombus Aging +---------+---------------+---------+-----------+----------+--------------+ CFV      Full           Yes      Yes                                 +---------+---------------+---------+-----------+----------+--------------+ SFJ      Full                                                        +---------+---------------+---------+-----------+----------+--------------+ FV Prox  Full                                                        +---------+---------------+---------+-----------+----------+--------------+ FV Mid   Full                                                        +---------+---------------+---------+-----------+----------+--------------+ FV DistalFull                                                         +---------+---------------+---------+-----------+----------+--------------+ PFV      Full                                                        +---------+---------------+---------+-----------+----------+--------------+ POP      Full           Yes      Yes                                 +---------+---------------+---------+-----------+----------+--------------+  PTV      Full                                                        +---------+---------------+---------+-----------+----------+--------------+ PERO     Full                                                        +---------+---------------+---------+-----------+----------+--------------+     Summary: BILATERAL: - No evidence of deep vein thrombosis seen in the lower extremities, bilaterally. -No evidence of popliteal cyst, bilaterally.   *See table(s) above for measurements and observations. Electronically signed by Monica Martinez MD on 06/26/2021 at 5:54:39 PM.    Final    Recent Labs    06/25/21 0515  WBC 4.6  HGB 8.5*  HCT 24.6*  PLT 187   Recent Labs    06/25/21 0515  NA 138  K 4.1  CL 98  CO2 32  GLUCOSE 115*  BUN 7*  CREATININE 0.61  CALCIUM 8.8*    Intake/Output Summary (Last 24 hours) at 06/27/2021 1526 Last data filed at 06/27/2021 1431 Gross per 24 hour  Intake 600 ml  Output --  Net 600 ml        Physical Exam: Vital Signs Blood pressure (!) 122/57, pulse 99, temperature 98.6 F (37 C), temperature source Oral, resp. rate 17, height 5\' 1"  (1.549 m), weight 73.2 kg, SpO2 94 %. Gen: no distress, normal appearing HEENT: oral mucosa pink and moist, NCAT Cardio: Reg rate Chest: normal effort, normal rate of breathing Abd: soft, non-distended Ext: no edema Psych: pleasant, normal affect, anxious about her pain Skin:    Comments: Hip incision is dressed.  Appropriately tender.  Neurological:    Comments: Patient is alert.  No acute distress.  Oriented x3 and follows commands.  Strength intact except pain limited in LLE.   Assessment/Plan: 1. Functional deficits which require 3+ hours per day of interdisciplinary therapy in a comprehensive inpatient rehab setting. Physiatrist is providing close team supervision and 24 hour management of active medical problems listed below. Physiatrist and rehab team continue to assess barriers to discharge/monitor patient progress toward functional and medical goals  Care Tool:  Bathing    Body parts bathed by patient: Right arm, Left arm, Chest, Abdomen, Right upper leg, Left upper leg, Face (simulated d/t pain)   Body parts bathed by helper: Buttocks, Right lower leg, Left lower leg, Front perineal area     Bathing assist Assist Level: Maximal Assistance - Patient 24 - 49%     Upper Body Dressing/Undressing Upper body dressing   What is the patient wearing?: Hospital gown only    Upper body assist Assist Level: Minimal Assistance - Patient > 75%    Lower Body Dressing/Undressing Lower body dressing      What is the patient wearing?: Pants     Lower body assist Assist for lower body dressing: Total Assistance - Patient < 25%     Toileting Toileting Toileting Activity did not occur Landscape architect and hygiene only):  (Patient voided in brief and had stool)  Toileting assist Assist for toileting: Moderate Assistance - Patient 50 - 74%  Transfers Chair/bed transfer  Transfers assist  Chair/bed transfer activity did not occur: Safety/medical concerns  Chair/bed transfer assist level: Minimal Assistance - Patient > 75%     Locomotion Ambulation   Ambulation assist   Ambulation activity did not occur: Safety/medical concerns  Assist level: Minimal Assistance - Patient > 75% Assistive device: Walker-rolling Max distance: 9ft   Walk 10 feet activity   Assist  Walk 10 feet activity did not occur: Safety/medical concerns  Assist level: Minimal Assistance - Patient > 75% Assistive device:  Walker-rolling   Walk 50 feet activity   Assist Walk 50 feet with 2 turns activity did not occur: Safety/medical concerns         Walk 150 feet activity   Assist Walk 150 feet activity did not occur: Safety/medical concerns         Walk 10 feet on uneven surface  activity   Assist Walk 10 feet on uneven surfaces activity did not occur: Safety/medical concerns         Wheelchair     Assist Will patient use wheelchair at discharge?: Yes Type of Wheelchair: Manual Wheelchair activity did not occur: Safety/medical concerns  Wheelchair assist level: Supervision/Verbal cueing Max wheelchair distance: 134 ft    Wheelchair 50 feet with 2 turns activity    Assist    Wheelchair 50 feet with 2 turns activity did not occur: Safety/medical concerns   Assist Level: Supervision/Verbal cueing   Wheelchair 150 feet activity     Assist  Wheelchair 150 feet activity did not occur: Safety/medical concerns       Blood pressure (!) 122/57, pulse 99, temperature 98.6 F (37 C), temperature source Oral, resp. rate 17, height 5\' 1"  (1.549 m), weight 73.2 kg, SpO2 94 %.    Medical Problem List and Plan: 1.   Debility secondary to left femoral intertrochanteric hip fracture.  Status post IM nailing 06/20/2021.  Weightbearing as tolerated             -patient may shower but incision must be covered             -ELOS/Goals: 2-3 weeks modI             -Continue CIR 2.  Impaired mobility: continue SCDs.               -antiplatelet therapy: Aspirin 81 mg daily and Plavix 75 mg daily 3. Post-operative left hip pain: Continue Oxycodone and Robaxin as needed. History of negative side effects to gabapentin. Placed order to request that oxycodone be administered 30 minutes prior to therapy sessions 4. Anxiety: Continue Effexor 75 mg daily, Xanax 1 mg- changed to scheduled. Discussed risk of respiratory depression with concurrent use of Xanax and oxycodone- she has been  tolerating both together.              -antipsychotic agents: N/A 5. Neuropsych: This patient is capable of making decisions on her own behalf. 6. Skin/Wound Care: Routine skin checks 7. Fluids/Electrolytes/Nutrition: Routine in and outs with follow-up chemistries 8.  Acute blood loss anemia.  CBC stable. Continue to monitor weekly.  9.  CAD with history of multiple stents.  Continue aspirin Plavix 10.  Hyperlipidemia.  Continue Crestor 11.  Nocturnal oxygen requirements.  Continue oxygen as prior to admission 12.  Hypertension.  Well controlled, continue Lopressor 25 mg twice daily.  Monitor with increased mobility 13.  Constipation.  Continue laxative assistance as directed  LOS: 3 days A FACE TO FACE EVALUATION WAS PERFORMED  Martha Clan P Alice Vitelli 06/27/2021, 3:26 PM

## 2021-06-27 NOTE — IPOC Note (Signed)
Overall Plan of Care [facility]) Patient Details Name: Carrie Chung MRN: [id number] DOB: 01/14/1941  Admitting Diagnosis: Intertrochanteric fracture of left hip [facility])  Hospital Problems: Principal Problem:   Intertrochanteric fracture of left hip ([place])     Functional Problem List: Nursing Edema, Endurance, Medication Management, Pain, Safety, Skin Integrity  PT Endurance, Motor, Pain, Sensory, Edema  OT Balance, Behavior, Edema, Endurance, Pain, Motor, Safety, Sensory  SLP    TR         Basic ADL's: OT Grooming, Bathing, Dressing, Toileting     Advanced  ADL's: OT       Transfers: PT Bed Mobility, Bed to Chair, Car, Manufacturing systems engineer, Metallurgist: PT Ambulation, Emergency planning/management officer, Stairs     Additional Impairments: OT None  SLP        TR      Anticipated Outcomes Item Anticipated Outcome  Self Feeding no goal  Swallowing      Basic self-care  Supervision  Toileting  Mod I   Bathroom Transfers Mod I toilet, Supervision shower  Bowel/Bladder  N/A  Transfers  mod I  Locomotion  supervision  Communication     Cognition     Pain  <3  Safety/Judgment  Supervision and no falls   Therapy Plan: PT Intensity: Minimum of 1-2 x/day ,45 to 90 minutes PT Frequency: 5 out of 7 days PT Duration Estimated Length of Stay: 14-21 days OT Intensity: Minimum of 1-2 x/day, 45 to 90 minutes OT Frequency: 5 out of 7 days OT Duration/Estimated Length of Stay: 18-21 days     Due to the current state of emergency, patients may not be receiving their 3-hours of Medicare-mandated therapy.   Team Interventions: Nursing Interventions Patient/Family Education, Pain Management, Medication Management, Skin Care/Wound Management, Discharge Planning, Psychosocial Support  PT interventions Ambulation/gait training, Community reintegration, Discharge planning, DME/adaptive equipment instruction, Pain management, Patient/family education  OT Interventions  Balance/vestibular training, Discharge planning, Functional electrical stimulation, Pain management, Self Care/advanced ADL retraining, Therapeutic Activities, UE/LE Coordination activities, Cognitive remediation/compensation, Disease mangement/prevention, Functional mobility training, Patient/family education, Skin care/wound managment, Therapeutic Exercise, Visual/perceptual remediation/compensation, Community reintegration, Engineer, drilling, Neuromuscular re-education, Psychosocial support, Splinting/orthotics, UE/LE Strength taining/ROM, Wheelchair propulsion/positioning  SLP Interventions    TR Interventions    SW/CM Interventions Discharge Planning, Psychosocial Support, Patient/Family Education   Barriers to Discharge MD  Medical stability  Nursing Decreased caregiver support, Home environment access/layout, Wound Care, Lack of/limited family support, Weight, Weight bearing restrictions, Medication compliance, Behavior 2 level home, bed/bath upstairs, plan to make arrangements for patient to sleep downstairs. 3 steps to enter front, no hand rails, 4 steps in garage. 14 steps inside with right hand rail. Daughter to provide intermittent care, and family planning to hire assistance.  PT Inaccessible home environment, Decreased caregiver support, Home environment access/layout, Weight bearing restrictions    OT Decreased caregiver support, Home environment access/layout    SLP      SW Decreased caregiver support     Team Discharge Planning: Destination: PT-Home ,OT- Home , SLP-  Projected Follow-up: PT-Home health PT, OT-  Home health OT, SLP-  Projected Equipment Needs: PT-To be determined, OT- 3 in 1 bedside comode, Tub/shower bench, To be determined, SLP-  Equipment Details: PT- , OT-  Patient/family involved in discharge planning: PT- Patient,  OT-Patient, SLP-   MD ELOS: 2-3 weeks  Medical Rehab Prognosis:  Excellent Assessment: Carrie Chung is an 80 year old  woman who is admitted to CIR  with left femoral intertrochanteric hip fracture.  Status post IM nailing 06/20/2021. Weightbearing as tolerated. Medications are being managed, and labs and vitals are being monitored regularly.     See Team Conference Notes for weekly updates to the plan of care

## 2021-06-27 NOTE — Progress Notes (Signed)
Physical Therapy Session Note  Patient Details  Name: Carrie Chung MRN: [id number] Date of Birth: 1941/04/18  Today's Date: 06/27/2021 PT Individual Time: 0800-0825 PT Individual Time Calculation (min): 25 min   Short Term Goals: Week 1:  PT Short Term Goal 1 (Week 1): pt will perform bed/chair transfers with CGA and LRAD PT Short Term Goal 2 (Week 1): Pt will initiate gait training with LRAD PT Short Term Goal 3 (Week 1): Pt will initiate stair training  Skilled Therapeutic Interventions/Progress Updates:   Received pt supine in bed, pt agreeable to PT treatment, and reported pain 5/10 in L hip. RN notified and present to administer pain medication at bed of session. Session with emphasis on functional mobility/transfers, dressing, generalized strengthening, dynamic standing balance/coordination, and improved activity tolerance. Pt transferred supine<>sitting EOB with mod A for LLE management and trunk control due to increased pain. Doffed gown and donned bra and pull over shirt with supervision. Donned underwear and pants sitting EOB with max A and transferred sit<>stand with RW and min A and required max A to pull underwear/pants over hips. Pt ambulated 53ft with RW and min A to recliner; noted decreased weight shifting onto LLE. Concluded session with pt sitting in recliner, needs within reach, and chair pad alarm on. NT and RN present at bedside.   Therapy Documentation Precautions:  Precautions Precautions: Fall Restrictions Weight Bearing Restrictions: Yes LLE Weight Bearing: Weight bearing as tolerated   Therapy/Group: Individual Therapy Alfonse Alpers PT, DPT   06/27/2021, 7:51 AM

## 2021-06-27 NOTE — Progress Notes (Addendum)
Physical Therapy Session Note  Patient Details  Name: Carrie Chung MRN: [id number] Date of Birth: 25-Nov-1941  Today's Date: 06/27/2021 PT Individual Time: 1300-1400, 1500-1540 PT Individual Time Calculation (min): 60 min, 40 min  Short Term Goals: Week 1:  PT Short Term Goal 1 (Week 1): pt will perform bed/chair transfers with CGA and LRAD PT Short Term Goal 2 (Week 1): Pt will initiate gait training with LRAD PT Short Term Goal 3 (Week 1): Pt will initiate stair training  Skilled Therapeutic Interventions/Progress Updates:    Session 1: Pt received in recliner and agreeable to therapy.  Reports pain 3/10 at start of session, up to 8/10 at times with activity. Pt left with ice pack and nursing present for meds pass at end of session. Sit to stand with RW CGA throughout session. Ambulatory transfer with CGA and RW recliner<>w/c<>mat table. W/c propulsion with BUE 2 x 150 with supervision. Pt directed in heel slides and SAQ to facilitate improved ROM and LLE strength. Gait x 20 ft with RW and CGA. Pt returned to recliner after session and was left with ice pack in place, all needs in reach.    Session 2:  Pt received in reclined and agreeable to therapy. Pt states she feels "a bit better" after receiving main medication ~1 hr ago. Pt performed Sit to stand with CGA throughout session with RW. Ambulatory transfer with RW and CGA to w/c. Pt propelled w/c to from gym x 100 ft. Ambulatory transfer  with CGA and RW to nu step to facilitate ROM and LE strength. Nustep x 10 min with emphasis on gentle ROM. Ambulatory transfer to w/c with CGA and RW, pt returned to room and performed ambulatory transfer to recliner in the same manner. Pt left in recliner with all needs in reach.   Therapy Documentation Precautions:  Precautions Precautions: Fall Restrictions Weight Bearing Restrictions: Yes LLE Weight Bearing: Weight bearing as tolerated     Therapy/Group: Individual Therapy  Mickel Fuchs 06/27/2021, 3:50 PM

## 2021-06-27 NOTE — Progress Notes (Signed)
Occupational Therapy Session Note  Patient Details  Name: Carrie Chung MRN: [id number] Date of Birth: [birth year]  Today's Date: 06/27/2021 OT Individual Time: 0901-1000 OT Individual Time Calculation (min): 59 min    Short Term Goals: Week 1:  OT Short Term Goal 1 (Week 1): Pt will perform BSC/toilet transfer with LRAD MIN A OT Short Term Goal 2 (Week 1): Pt will perform LB dressing sit <> stands level Mod A OT Short Term Goal 3 (Week 1): Pt will perform UB/LB dress with AE PRN with Mod A  Skilled Therapeutic Interventions/Progress Updates:     Pt received seated in w/c with care coordinator present, agreeable to therapy. Reports current L hip pain at 7/10. Session focus on STS, static standing balance, BLE WS and equal WB in prep for improved ADL/func mobility performance. Stand-pivot recliner > w/c with RW + light min A to power up and increased time to complete, min Vcs to push up from arm rest. Declines ADL this session. MD in/out for morning assessment. Total A w/c transport to and from gym with total A 2/2 pain management and energy conservation. Stand-pivot w/c <> therapy mat same manenr as above. Completed 3 STS from elevated surface with CGA and lower height light min A. Demonstrates increased forward trunk flexion and req mod Vcs throughout to push-up from mat and to facilitate upright posture. Elects to kick-stand LLE for pain management. Practiced shifting weight back and forth from RLE to LLE, demonstrates much improved WB on LLE than in previous sessions. Stood to complete 2 lego designs of level 2 difficulty, req min A to complete with 100% accuracy. Reviewed energy conservation strategies and importance of self-monitoring fatigue/pain levels. Reports decreased in pain from 7/10 to 5/10 at end of session. Stand-pivot back to bed with light min A to power up, returned to supine with mod A to progress BLE onto bed.   Pt left semi-reclined in bed with bed alarm engaged, call bell  in reach, and all immediate needs met.    Therapy Documentation Precautions:  Precautions Precautions: Fall Restrictions Weight Bearing Restrictions: Yes LLE Weight Bearing: Weight bearing as tolerated  Pain: see session note ADL: See Care Tool for more details.  Therapy/Group: Individual Therapy  Volanda Napoleon MS, OTR/L  06/27/2021, 6:54 AM

## 2021-06-28 MED ORDER — METOPROLOL TARTRATE 12.5 MG HALF TABLET
12.5000 mg | ORAL_TABLET | Freq: Two times a day (BID) | ORAL | Status: DC
  Administered 2021-06-28 – 2021-07-10 (×24): 12.5 mg via ORAL
  Filled 2021-06-28 (×24): qty 1

## 2021-06-28 NOTE — Progress Notes (Signed)
Physical Therapy Session Note  Patient Details  Name: Carrie Chung MRN: [id number] Date of Birth: 07/01/1941  Today's Date: 06/28/2021 PT Individual Time: 0900-0945 PT Individual Time Calculation (min): 45 min   Short Term Goals: Week 1:  PT Short Term Goal 1 (Week 1): pt will perform bed/chair transfers with CGA and LRAD PT Short Term Goal 2 (Week 1): Pt will initiate gait training with LRAD PT Short Term Goal 3 (Week 1): Pt will initiate stair training  Skilled Therapeutic Interventions/Progress Updates:  Pt resting in bed.  She rated pain 5/10 at surgical site LLE, premedicated.  Therapeutic exercises performed with LE s and trunk to increase strength for functional mobility: in supine-  25 x 2 alternating ankle pumps, 15 x 1 R straight leg raises.  2 x 10- cervical flexion, L active assistive : heel slides, L hip abduction, bil hip adductor squeezes, straight leg raises.  Supine> sit to L as per home situation, mod assist.  (Pt reported that this was more difficult than getting up to r side of bed, and she would consider switching sides of the bed she sleeps on at home. )Sit> stand with RW, CGA.  Gait training x 25' over level tile, RW, CGA,  WBAT LLE.  Stand> sit with min assist.    Wc> recliner stand pivot transfer with min assist. At end of session, pt seated in recliner with needs at hand and seat pad alarm set, LEs elevated .     Therapy Documentation Precautions:  Precautions Precautions: Fall Restrictions Weight Bearing Restrictions: Yes LLE Weight Bearing: weight bearing as tolerated       Therapy/Group: Individual Therapy  Waynetta Metheny 06/28/2021, 5:02 PM

## 2021-06-28 NOTE — Progress Notes (Signed)
PROGRESS NOTE   Subjective/Complaints: Doing ok this morning Would like to shower Worried that her daughter is being overburdened with care of her husband  ROS: +left hip pain, +anxiety, +insomnia, denies chest pain  Objective:   No results found. No results for input(s): WBC, HGB, HCT, PLT in the last 72 hours.  No results for input(s): NA, K, CL, CO2, GLUCOSE, BUN, CREATININE, CALCIUM in the last 72 hours.   Intake/Output Summary (Last 24 hours) at 06/28/2021 1613 Last data filed at 06/28/2021 1410 Gross per 24 hour  Intake 680 ml  Output --  Net 680 ml        Physical Exam: Vital Signs Blood pressure (!) 109/51, pulse 85, temperature 100.1 F (37.8 C), temperature source Oral, resp. rate 18, height 5\' 1"  (1.549 m), weight 73.2 kg, SpO2 98 %. Gen: no distress, normal appearing HEENT: oral mucosa pink and moist, NCAT Cardio: Reg rate Chest: normal effort, normal rate of breathing Abd: soft, non-distended Ext: no edema Psych: pleasant, normal affect, anxious about her pain Skin:    Comments: Hip incision is dressed.  Appropriately tender.  Neurological:    Comments: Patient is alert.  No acute distress.  Oriented x3 and follows commands. Strength intact except pain limited in LLE.   Assessment/Plan: 1. Functional deficits which require 3+ hours per day of interdisciplinary therapy in a comprehensive inpatient rehab setting. Physiatrist is providing close team supervision and 24 hour management of active medical problems listed below. Physiatrist and rehab team continue to assess barriers to discharge/monitor patient progress toward functional and medical goals  Care Tool:  Bathing    Body parts bathed by patient: Right arm, Left arm, Chest, Abdomen, Right upper leg, Left upper leg, Face (simulated d/t pain)   Body parts bathed by helper: Buttocks, Right lower leg, Left lower leg, Front perineal area      Bathing assist Assist Level: Maximal Assistance - Patient 24 - 49%     Upper Body Dressing/Undressing Upper body dressing   What is the patient wearing?: Hospital gown only    Upper body assist Assist Level: Minimal Assistance - Patient > 75%    Lower Body Dressing/Undressing Lower body dressing      What is the patient wearing?: Pants     Lower body assist Assist for lower body dressing: Total Assistance - Patient < 25%     Toileting Toileting Toileting Activity did not occur Landscape architect and hygiene only):  (Patient voided in brief and had stool)  Toileting assist Assist for toileting: Moderate Assistance - Patient 50 - 74%     Transfers Chair/bed transfer  Transfers assist  Chair/bed transfer activity did not occur: Safety/medical concerns  Chair/bed transfer assist level: Minimal Assistance - Patient > 75%     Locomotion Ambulation   Ambulation assist   Ambulation activity did not occur: Safety/medical concerns  Assist level: Minimal Assistance - Patient > 75% Assistive device: Walker-rolling Max distance: 100ft   Walk 10 feet activity   Assist  Walk 10 feet activity did not occur: Safety/medical concerns  Assist level: Minimal Assistance - Patient > 75% Assistive device: Walker-rolling   Walk 50 feet activity  Assist Walk 50 feet with 2 turns activity did not occur: Safety/medical concerns         Walk 150 feet activity   Assist Walk 150 feet activity did not occur: Safety/medical concerns         Walk 10 feet on uneven surface  activity   Assist Walk 10 feet on uneven surfaces activity did not occur: Safety/medical concerns         Wheelchair     Assist Will patient use wheelchair at discharge?: Yes Type of Wheelchair: Manual Wheelchair activity did not occur: Safety/medical concerns  Wheelchair assist level: Supervision/Verbal cueing Max wheelchair distance: 134 ft    Wheelchair 50 feet with 2 turns  activity    Assist    Wheelchair 50 feet with 2 turns activity did not occur: Safety/medical concerns   Assist Level: Supervision/Verbal cueing   Wheelchair 150 feet activity     Assist  Wheelchair 150 feet activity did not occur: Safety/medical concerns       Blood pressure (!) 109/51, pulse 85, temperature 100.1 F (37.8 C), temperature source Oral, resp. rate 18, height 5\' 1"  (1.549 m), weight 73.2 kg, SpO2 98 %.    Medical Problem List and Plan: 1.   Debility secondary to left femoral intertrochanteric hip fracture.  Status post IM nailing 06/20/2021.  Weightbearing as tolerated             -patient may shower but incision must be covered             -ELOS/Goals: 2-3 weeks modI             -Continue CIR 2.  Impaired mobility: continue SCDs.               -antiplatelet therapy: continue Aspirin 81 mg daily and Plavix 75 mg daily 3. Post-operative left hip pain: Continue Oxycodone and Robaxin as needed. History of negative side effects to gabapentin. Placed order to request that oxycodone be administered 30 minutes prior to therapy sessions 4. Anxiety: Continue Effexor 75 mg daily, Xanax 1 mg- changed to scheduled. Discussed risk of respiratory depression with concurrent use of Xanax and oxycodone- she has been tolerating both together.              -antipsychotic agents: N/A 5. Neuropsych: This patient is capable of making decisions on her own behalf. 6. Skin/Wound Care: Routine skin checks 7. Fluids/Electrolytes/Nutrition: Routine in and outs with follow-up chemistries 8.  Acute blood loss anemia.  CBC stable. Continue to monitor weekly.  9.  CAD with history of multiple stents.  Continue aspirin Plavix 10.  Hyperlipidemia.  Continue Crestor 11.  Nocturnal oxygen requirements.  Continue oxygen as prior to admission 12.  Hypertension.  Well controlled, decrease lopressor to 12.5mg  BID.  Monitor with increased mobility 13.  Constipation.  Continue laxative assistance as  directed  LOS: 4 days A FACE TO FACE EVALUATION WAS PERFORMED  Carrie Chung 06/28/2021, 4:13 PM

## 2021-06-29 NOTE — Progress Notes (Addendum)
Occupational Therapy Session Note  Patient Details  Name: Carrie Chung MRN: [id number] Date of Birth: 15-Feb-1941  Today's Date: 06/29/2021 OT Individual Time: 9150-5697 and 9480-1655 OT Individual Time Calculation (min): 59 min  and 73 min   Short Term Goals: Week 1:  OT Short Term Goal 1 (Week 1): Pt will perform BSC/toilet transfer with LRAD MIN A OT Short Term Goal 2 (Week 1): Pt will perform LB dressing sit <> stands level Mod A OT Short Term Goal 3 (Week 1): Pt will perform UB/LB dress with AE PRN with Mod A  Skilled Therapeutic Interventions/Progress Updates:    Session 1: Pt received supine, agreeable to OT, reporting pain in L hip but unrated. Session focused on BADLs. Sup>sit using R side log rolling technique close spvsn with mod vc's and use of bed rails. Sit<>stand CGA from elevated bed height; pt reporting easier to do this session. Functional ambulation with RW bed>bathroom CGA with pt independent safety awareness with RW management. However, min vc's for RW management during sit<>stands. Showered on TTB close spvsn and CGA when in stance with use of grab bars; min A to wash LLE. Lateral leaning technique to clean buttocks seated. Doffing/donning nonskid socks dependently due to time constraints; education on use of AE in future. UB dressing close spvsn. LB dressing seated and in stance mod A to thread over LLE. Stand step transfer TTB>w/c CGA. Pt SOB and nauseous following showering, but improved slightly after seated rest break. Oral hygiene set up A in w/c at sink. Pt ambulated with RW w/c> recliner and remained seated, alarm set, call bell in hand, all immediate needs met.  Session 2: Pt received supine, agreeable to OT, reporting pain in L hip. Pt provided leg lifter per PT recommendation from last session (due to unavailability of reachers at time); provided education on AE use and pt used appropriately with vc's. Sup>sit with leg lifter close spvsn. Sit<>stand CGA with RW  from bed & w/c - required min A from low height toilet. Ambulated with RW CGA-min A bed>bathroom>w/c; min A required going over step up threshold. 3/3 toileting tasks completed min-mod A for pulling pants up over hips in stance with RW due to balance; pt continent of bladder and bowel. Pt washed hands sitting in w/c at sink. Pt requested to go outside for psychosocial wellbeing; pt self-propelled w/c ~50' before requiring rest break due to fatigue. BUE HEP using green level 3 theraband completed targeting BUE strengthening and endurance for improved independence in ADLs: abduction, forward punches, diagonal pulls up, and rows - 1 set of 10x each. Throughout exercises, pt SOB and reports of lightheadedness requiring frequent rest breaks for improvement. Education provided on breathing techniques including counting, holding with slow release, and diaphragmatic breathing in multiple positions. Pt taken to gift shop for community reintegration training and w/c navigation, self-propelling with no cuing required. Pt returned to room, remained supine, ice pack for hip provided, call bell in reach, alarm set, all immediate needs met.  Therapy Documentation Precautions:  Precautions Precautions: Fall Restrictions Weight Bearing Restrictions: Yes LLE Weight Bearing: Non weight bearing  Pain: Pain Assessment Pain Scale: 0-10 Pain Score:  (unrated) Pain Type: Surgical pain Pain Location: Hip Pain Orientation: Left Pain Descriptors / Indicators: Aching Pain Onset: With Activity Pain Intervention(s): Repositioned;Shower;Distraction;Rest Multiple Pain Sites: No   Therapy/Group: Individual Therapy  Mellissa Kohut 06/29/2021, 9:22 AM

## 2021-06-29 NOTE — Progress Notes (Signed)
Physical Therapy Session Note  Patient Details  Name: Carrie Chung MRN: [id number] Date of Birth: August 22, 1941  Today's Date: 06/29/2021 PT Individual Time: 1105-1200 PT Individual Time Calculation (min): 55 min   Short Term Goals: Week 1:  PT Short Term Goal 1 (Week 1): pt will perform bed/chair transfers with CGA and LRAD PT Short Term Goal 2 (Week 1): Pt will initiate gait training with LRAD PT Short Term Goal 3 (Week 1): Pt will initiate stair training  Skilled Therapeutic Interventions/Progress Updates:     Patient in recliner with her daughter and husband in the room upon PT arrival. Patient alert and agreeable to PT session. Patient reported 6-7/10 L hip pain during session, RN made aware. PT provided repositioning, rest breaks, and distraction as pain interventions throughout session. Patient's family departed at beginning of session.  Patient reported increased fatigue from showing this morning with OT, focused session on increasing independence with bed mobility, positioning, and strengthening/ROM bed level due to patient's fatigue and pain levels.  Therapeutic Activity: Bed Mobility: Patient performed sit to supine with CGA-supervision using gait belt as a leg lifter for her L leg with min use of bed rails. Provided verbal cues for use of gait belt and performing task in segments to reduce use of momentum with mobility. She performed rolling R/L with HOB slightly elevated to simulate wedge on her bed for management of acid reflux. Placed pillow between knees to reduce hip pain and improve positioning tolerance. Patient unable to tolerate side-lying on either side >1 min due to L hip pain. Encouraged to try again later this week when pain has improved. Patient performed scooting up in the bed x3 with mod I using bed rails.   Transfers: Patient performed stand pivot recliner>bed with CGA for safety using RW. Provided verbal cues for hand placement and forward weight shift prior to  boosting up to standing for improved balance. Utilized step-to gait pattern with partial weight bearing on L leg during transfer.  Therapeutic Exercise: Patient performed the following exercises with her L lower extremity with HOB elevated 20 deg to simulate wedge at home and use of gait belt as a leg lifter to assist with all exercises, provided verbal and tactile cues for proper technique. -knee/hip flexion 2x5 -hip abd/add 2x6 -hip IR/ER x10 -SLR x8 -DF stretch 2x30 sec  Educated on benefits of ROM exercises and mobility for improved functional mobility and pain management and discussed procedure for IM nailing, reviewed x-rays and CT with patient. Discussed energy conservation strategies and fall risk and prevention during rest breaks between exercises.   Patient in bed at end of session with breaks locked, bed alarm set, and all needs within reach.   Therapy Documentation Precautions:  Precautions Precautions: Fall Restrictions Weight Bearing Restrictions: Yes LLE Weight Bearing: Non weight bearing    Therapy/Group: Individual Therapy  Zakaiya Lares L Gedalia Mcmillon PT, DPT  06/29/2021, 12:20 PM

## 2021-06-30 NOTE — Progress Notes (Addendum)
Physical Therapy Session Note  Patient Details  Name: Carrie Chung MRN: [id number] Date of Birth: 1941-10-03  Today's Date: 06/30/2021 PT Time: 1100-1200, 1410-1512 PT minutes: 60 min, 62 min    Short Term Goals: Week 1:  PT Short Term Goal 1 (Week 1): pt will perform bed/chair transfers with CGA and LRAD PT Short Term Goal 2 (Week 1): Pt will initiate gait training with LRAD PT Short Term Goal 3 (Week 1): Pt will initiate stair training  Skilled Therapeutic Interventions/Progress Updates:   Session 1:  Pt received in recliner and agreeable to therapy.  Stand pivot transfer to w/c with RW and CGA. Pt propelled w/c x 150 ft with supervision. Pt was instructed on and navigated 5" curb step x4 to mimic small step in her home environment. Pt required continued cueing for technique and verbal encouragement. Pt then propelled w/c x 200 ft to day room for improved endurance and functional mobility. Stand pivot transfer to nu step with CGA and RW. Pt performed 31 steps at level 1 for ROM and LE strength. Pt demoed increased speed and ROM from previous trials. Pt propelled w/c x 200 ft with supervision and returned to recliner after session. Pt left with all needs in reach.   Session 2: Pt received in recliner and agreeable to therapy.  Pt stated she received pain medication slightly before session. Ambulatory transfer to w/c with RW and CGA. Pt propelled wc with BUE to therapy gym with supervision and performed Stand pivot transfer with RW and CGA to mat table. Seated on mat table with stool under feet, pt directed in LAQ and ankle pumps for ROM and LLE strength. Pt c/o discomfort with LAQ but able to tolerate with conversation as distraction. Discussed elements of pain science during rest breaks. Pt directed in gait x 80 ft, x 112 ft, x152 ft with seated rest breaks. Gait with RW and CGA fading to supervision. Pt demoed step to gait pattern and mild slumped posture. Pt with improving endurance and  WB tolerance on LLE. Pt transported back to room, ambulatory transfer to bed with RW and CGA. Bed mobility with supervision, using RLE to lift LLE into bed. Pt left in room with bed alarm set. All needs in reach.   Therapy Documentation Precautions:  Precautions Precautions: Fall Restrictions Weight Bearing Restrictions: Yes LLE Weight Bearing: Non weight bearing General:    Therapy/Group: Individual Therapy  Mickel Fuchs 06/30/2021, 3:58 PM

## 2021-06-30 NOTE — Progress Notes (Signed)
Occupational Therapy Session Note  Patient Details  Name: SACHEEN ARRASMITH MRN: [id number] Date of Birth: 12-24-1940  Today's Date: 06/30/2021 OT Individual Time: 4458-4835 OT Individual Time Calculation (min): 59 min    Short Term Goals: Week 1:  OT Short Term Goal 1 (Week 1): Pt will perform BSC/toilet transfer with LRAD MIN A OT Short Term Goal 2 (Week 1): Pt will perform LB dressing sit <> stands level Mod A OT Short Term Goal 3 (Week 1): Pt will perform UB/LB dress with AE PRN with Mod A   Skilled Therapeutic Interventions/Progress Updates:    Pt greeted at time of session semireclined in bed resting with 7/10 L hip pain, RN present at beginning of session for med pass including pain meds. Supine > sit CGA with use of belt as lifter for LLE and sitting EOB for UB/LB dressing tasks. Set up for UB bra and pull over shirt, LB dressing Min A for underwear/pants to thread LLE only and plan to provide reacher. Work simplification/energy conservation technique for donning underwear/pants at same time for single stand, able to stand to don over hips prior to walking short distance to wheelchair. Set up at sink for oral hygiene and face washing with set up. Wanting to sit in recliner, walked short distance in room CGA with RW to recliner and set up with alarm on call bell in reach and ice pack. Note improved transitional movements and mobility this session compared to previous sessions.     Therapy Documentation Precautions:  Precautions Precautions: Fall Restrictions Weight Bearing Restrictions: Yes LLE Weight Bearing: Non weight bearing     Therapy/Group: Individual Therapy  Viona Gilmore 06/30/2021, 7:20 AM

## 2021-06-30 NOTE — Progress Notes (Signed)
Occupational Therapy Session Note  Patient Details  Name: Carrie Chung MRN: [id number] Date of Birth: [birth year]  Today's Date: 06/30/2021 OT Individual Time: 3601-6580 OT Individual Time Calculation (min): 29 min   Short Term Goals: Week 1:  OT Short Term Goal 1 (Week 1): Pt will perform BSC/toilet transfer with LRAD MIN A OT Short Term Goal 2 (Week 1): Pt will perform LB dressing sit <> stands level Mod A OT Short Term Goal 3 (Week 1): Pt will perform UB/LB dress with AE PRN with Mod A  Skilled Therapeutic Interventions/Progress Updates:    Pt greeted in the recliner, premedicated for pain with ice pack on her Lt hip upon arrival. ADL needs were met. Therefore tx focus was placed on UB strengthening and endurance for functional carryover during self care activity. While seated edge of chair, guided her through UB exercises using 3# bar x15 reps 2 sets. Also educated pt on gentle shoulder stretches as well. Pt remained sitting up in the recliner with all needs within reach and chair alarm set.   Therapy Documentation Precautions:  Precautions Precautions: Fall Restrictions Weight Bearing Restrictions: Yes LLE Weight Bearing: Non weight bearing  Vital Signs: Therapy Vitals Temp: 97.6 F (36.4 C) Pulse Rate: 95 Resp: 17 BP: 121/61 Patient Position (if appropriate): Sitting Oxygen Therapy SpO2: 93 % O2 Device: Room Air   ADL: ADL Eating: Not assessed Grooming: Supervision/safety Upper Body Bathing: Minimal assistance (simulated) Lower Body Bathing: Maximal assistance Upper Body Dressing: Minimal assistance Lower Body Dressing: Dependent Toileting: Dependent Toilet Transfer: Not assessed (NA d/t pain)     Therapy/Group: Individual Therapy  Kayna Suppa A Yer Olivencia 06/30/2021, 12:41 PM

## 2021-07-01 NOTE — Progress Notes (Signed)
Patient ID: Carrie Chung, female   DOB: 03-Sep-1941, 80 y.o.   MRN: [id number] Met with pt to discuss team conference goals of supervision-mod/i level and target discharge date 7/20. Pt is not sure if she will be ready by then they need to move bed downstairs and her son in-law will be out of town this weekend. She will talk with daughter and work on a plan. The aide they hired will be there 10-1 pm and will be primarily helping husband but can help her also. So pt will need to be mod/I since will be there with husband who can not assist her. Pt is also asking if the pain she is experiencing normal and may want to see ortho MD to have look at her leg. Continue to work on discharge needs.

## 2021-07-01 NOTE — Patient Care Conference (Signed)
Inpatient RehabilitationTeam Conference and Plan of Care Update Date: 07/01/2021   Time: 11:38 AM    Patient Name: Carrie Chung      Medical Record Number: [id number]  Date of Birth: 1941/10/25 Sex: Female         Room/Bed: 4M06C/4M06C-01 Payor Info: Payor: MEDICARE / Plan: MEDICARE PART A AND B / Product Type: *No Product type* /    Admit Date/Time:  06/24/2021  4:02 PM  Primary Diagnosis:  Intertrochanteric fracture of left hip [facility])  Hospital Problems: Principal Problem:   Intertrochanteric fracture of left hip [facility])    Expected Discharge Date: Expected Discharge Date: 07/09/21  Team Members Present: Physician leading conference: Dr. Courtney Heys Care Coodinator Present: Ovidio Kin, LCSW;Radie Berges Creig Hines, RN, BSN, [place] Nurse Present: Dorthula Nettles, RN PT Present: Other (comment) Ailene Rud, PT) OT Present: Lillia Corporal, OT PPS Coordinator present : Gunnar Fusi, SLP     Current Status/Progress Goal Weekly Team Focus  Bowel/Bladder             Swallow/Nutrition/ Hydration             ADL's   CGA bathing shower level, CGA ADL transfers toilet/shower w/ RW, LB dress Min/Mod, plan to train with AE, pain much improved  Supervision bathe/LB dress, Mod I toilet  standing balance/tolerance, ADL transfers, ADL retraining, AE training, pain management and positioning, global endurance   Mobility   close supervision gait and transfers  mod I transfers, supervision gait  pain management, LE strength and ROM, functional mobility   Communication             Safety/Cognition/ Behavioral Observations            Pain             Skin               Discharge Planning:  Home with husband who has health issues and daughter to be in and out, mya need to hire assist.   Team Discussion: Orthostatic VS needed. Pain is a problem, push fluids. Continent B/B, 7/10 pain to left hip. Abrasion to left elbow, incision to left hip. Patient on target to meet rehab  goals: yes, significant improvement this week. Contact guard for ADL transfers, walking to bathroom, contact guard in shower. Using adaptive equipment. Supervision bathing/dressing. Mod I toileting. Contact guard/supervision for gait and transfers. Walked 150 ft yesterday. Ramp to be built before discharge.  *See Care Plan and progress notes for long and short-term goals.   Revisions to Treatment Plan:  Checking orthostatic vital signs.  Teaching Needs: Family education, medication management, pain management, skin/wound care, weight bearing education, transfer training, gait training, balance training, endurance training, safety awareness.  Current Barriers to Discharge: Decreased caregiver support, Medical stability, Home enviroment access/layout, Wound care, Lack of/limited family support, Weight, Weight bearing restrictions, Medication compliance, and Behavior  Possible Resolutions to Barriers: Continue current medications, provide emotional support.     Medical Summary Current Status: continent x2; L hip pain 7/10- taking 10 mg q4 hours prn- abrasion L elbow; L hip incision; Orthostatic? dizzy/lightheaded with standing  Barriers to Discharge: Decreased family/caregiver support;Behavior;Home enviroment access/layout;Other (comments);Weight bearing restrictions;Weight;Wound care;Medical stability  Barriers to Discharge Comments: pain 7/10- better than initially- still biggest limiter- has long term insurance- to get care/assistance at home. Possible Resolutions to [employer] Focus: checking orthostatics- - dizzy/lightheaded/nausea with standing- also limiting- walking 150 ft RW- getting ramp- d/c 7/20   Continued Need for Acute Rehabilitation Level of  Care: The patient requires daily medical management by a physician with specialized training in physical medicine and rehabilitation for the following reasons: Direction of a multidisciplinary physical rehabilitation program to maximize  functional independence : Yes Medical management of patient stability for increased activity during participation in an intensive rehabilitation regime.: Yes Analysis of laboratory values and/or radiology reports with any subsequent need for medication adjustment and/or medical intervention. : Yes   I attest that I was present, lead the team conference, and concur with the assessment and plan of the team.   Cristi Loron 07/01/2021, 6:03 PM

## 2021-07-01 NOTE — Progress Notes (Signed)
Orthostatic vitals completed per MD Lovorns order.      07/01/21 1637 07/01/21 1640 07/01/21 1643  Vitals  BP 133/61 (!) 146/55 125/85  MAP (mmHg) 82 80 98  BP Method Automatic Automatic Automatic  Patient Position (if appropriate) Lying Sitting Standing  Pulse Rate 90 97 (!) 105

## 2021-07-01 NOTE — Progress Notes (Signed)
PROGRESS NOTE   Subjective/Complaints:   Worried that daughter has to take care of pt's husband.  Took 2 Oxycodone/10 mg yesterday as discussed- she reports things somewhat better- pain wise- was able to walk much better and went up 1 stiar.  Also admits to being dizzy/lightheaded and nauseated with standing for prolonged period- not actual vertigo.  Is drinking ~ 4 cups of water/daily. And some coffee.  ROS:  Pt denies SOB, abd pain, CP, N/V/C/D, and vision changes   Objective:   No results found. No results for input(s): WBC, HGB, HCT, PLT in the last 72 hours.  No results for input(s): NA, K, CL, CO2, GLUCOSE, BUN, CREATININE, CALCIUM in the last 72 hours.   Intake/Output Summary (Last 24 hours) at 07/01/2021 0916 Last data filed at 07/01/2021 0741 Gross per 24 hour  Intake 836 ml  Output --  Net 836 ml        Physical Exam: Vital Signs Blood pressure (!) 121/58, pulse 92, temperature 98.1 F (36.7 C), temperature source Oral, resp. rate 19, height 5\' 1"  (1.549 m), weight 73.2 kg, SpO2 98 %.   General: awake, alert, appropriate, sitting up in bed- more awake today; NAD HENT: conjugate gaze; oropharynx moist CV: regular rate; no JVD Pulmonary: CTA B/L; no W/R/R- good air movement GI: soft, NT, ND, (+)BS- normoactive Psychiatric: appropriate; less anxious Neurological: Ox3  Skin: 2 L hip incisions C/D/I- but underneath have steristrips- a little dried blood- moderate bruising and edema, but no drainage on wound/banadage.  Neurological:    Comments: Patient is alert.  No acute distress.  Oriented x3 and follows commands. Strength intact except pain limited in LLE.   Assessment/Plan: 1. Functional deficits which require 3+ hours per day of interdisciplinary therapy in a comprehensive inpatient rehab setting. Physiatrist is providing close team supervision and 24 hour management of active medical problems  listed below. Physiatrist and rehab team continue to assess barriers to discharge/monitor patient progress toward functional and medical goals  Care Tool:  Bathing    Body parts bathed by patient: Right arm, Left arm, Chest, Abdomen, Right upper leg, Left upper leg, Face, Front perineal area, Buttocks, Right lower leg   Body parts bathed by helper: Buttocks, Right lower leg, Left lower leg, Front perineal area     Bathing assist Assist Level: Contact Guard/Touching assist     Upper Body Dressing/Undressing Upper body dressing   What is the patient wearing?: Pull over shirt, Bra    Upper body assist Assist Level: Set up assist    Lower Body Dressing/Undressing Lower body dressing      What is the patient wearing?: Pants, Underwear/pull up     Lower body assist Assist for lower body dressing: Minimal Assistance - Patient > 75%     Toileting Toileting Toileting Activity did not occur (Clothing management and hygiene only):  (Patient voided in brief and had stool)  Toileting assist Assist for toileting: Moderate Assistance - Patient 50 - 74%     Transfers Chair/bed transfer  Transfers assist  Chair/bed transfer activity did not occur: Safety/medical concerns  Chair/bed transfer assist level: Contact Guard/Touching assist     Locomotion Ambulation  Ambulation assist   Ambulation activity did not occur: Safety/medical concerns  Assist level: Supervision/Verbal cueing Assistive device: Walker-rolling Max distance: 152   Walk 10 feet activity   Assist  Walk 10 feet activity did not occur: Safety/medical concerns  Assist level: Minimal Assistance - Patient > 75% Assistive device: Walker-rolling   Walk 50 feet activity   Assist Walk 50 feet with 2 turns activity did not occur: Safety/medical concerns  Assist level: Supervision/Verbal cueing Assistive device: Walker-rolling    Walk 150 feet activity   Assist Walk 150 feet activity did not occur:  Safety/medical concerns  Assist level: Supervision/Verbal cueing Assistive device: Walker-rolling    Walk 10 feet on uneven surface  activity   Assist Walk 10 feet on uneven surfaces activity did not occur: Safety/medical concerns         Wheelchair     Assist Will patient use wheelchair at discharge?: Yes Type of Wheelchair: Manual Wheelchair activity did not occur: Safety/medical concerns  Wheelchair assist level: Supervision/Verbal cueing Max wheelchair distance: 134 ft    Wheelchair 50 feet with 2 turns activity    Assist    Wheelchair 50 feet with 2 turns activity did not occur: Safety/medical concerns   Assist Level: Supervision/Verbal cueing   Wheelchair 150 feet activity     Assist  Wheelchair 150 feet activity did not occur: Safety/medical concerns       Blood pressure (!) 121/58, pulse 92, temperature 98.1 F (36.7 C), temperature source Oral, resp. rate 19, height 5\' 1"  (1.549 m), weight 73.2 kg, SpO2 98 %.    Medical Problem List and Plan: 1.   Debility secondary to left femoral intertrochanteric hip fracture.  Status post IM nailing 06/20/2021.  Weightbearing as tolerated             -patient may shower but incision must be covered             -ELOS/Goals: 2-3 weeks modI            Con't CIR/ PT and PT- will decide d/c date today, if not already done 2.  Impaired mobility: continue SCDs.               -antiplatelet therapy: continue Aspirin 81 mg daily and Plavix 75 mg daily 3. Post-operative left hip pain: Continue Oxycodone and Robaxin as needed. History of negative side effects to gabapentin. Placed order to request that oxycodone be administered 30 minutes prior to therapy sessions  7/12- will have pt take 10 mg Oxy q4 hours prn- she had been trying to avoid taking unless had to- explained that she will do better in therapy if pain better controlled- will get off meds- I promise, but needs for now.  4. Anxiety: Continue Effexor 75 mg  daily, Xanax 1 mg- changed to scheduled. Discussed risk of respiratory depression with concurrent use of Xanax and oxycodone- she has been tolerating both together.              -antipsychotic agents: N/A 5. Neuropsych: This patient is capable of making decisions on her own behalf. 6. Skin/Wound Care: Routine skin checks 7. Fluids/Electrolytes/Nutrition: Routine in and outs with follow-up chemistries 8.  Acute blood loss anemia.  CBC stable. Continue to monitor weekly.  9.  CAD with history of multiple stents.  Continue aspirin Plavix 10.  Hyperlipidemia.  Continue Crestor 11.  Nocturnal oxygen requirements.  Continue oxygen as prior to admission 12.  Hypertension.  Well controlled, decrease lopressor to 12.5mg  BID.  Monitor with increased mobility  7/12- BP well controlled- however having dizziness- see #14 for more.-con't regimen for now.  13.  Constipation.  Continue laxative assistance as directed 14. Lightheaded  7/12- will order Orthostatics to be done- see if it's (+)- if so, will decrease Lopressor- also have pt drink more- she's only drinking 4 cups/water daily- will increase to at least 6 cups/day.    LOS: 7 days A FACE TO FACE EVALUATION WAS PERFORMED  Monique Gift 07/01/2021, 9:16 AM

## 2021-07-01 NOTE — Progress Notes (Signed)
Physical Therapy Session Note  Patient Details  Name: Carrie Chung MRN: [id number] Date of Birth: 05-Dec-1941  Today's Date: 07/01/2021 PT Individual Time: 0900-1000 PT Individual Time Calculation (min): 60 min   Short Term Goals: Week 1:  PT Short Term Goal 1 (Week 1): pt will perform bed/chair transfers with CGA and LRAD PT Short Term Goal 2 (Week 1): Pt will initiate gait training with LRAD PT Short Term Goal 3 (Week 1): Pt will initiate stair training  Skilled Therapeutic Interventions/Progress Updates:    Pt received in recliner and agreeable to therapy.  States she was premedicated for pain ~1 hr before session. Sit to stand and Stand pivot transfer with RW and CGA fading to supervision at end of session. Session focused on problem solving stair navigation to allow pt to use second floor bedroom instead of staying downstairs. Trialled lateral navigation, cane and handrail, with limited success. Pt demoed difficulty lifting LLE without assist. Pt directed in lateral stepping obstacle course in // bars, pt required assist to lift LLE onto/over obstacles. Pt returned to room and remained in recliner after session with ice pack in place and all needs in reach.  Therapy Documentation Precautions:  Precautions Precautions: Fall Restrictions Weight Bearing Restrictions: No LLE Weight Bearing: Weight bearing as tolerated     Therapy/Group: Individual Therapy  Mickel Fuchs 07/01/2021, 7:51 AM

## 2021-07-01 NOTE — Progress Notes (Signed)
Occupational Therapy Session Note  Patient Details  Name: Carrie Chung MRN: [id number] Date of Birth: February 27, 1941  Today's Date: 07/01/2021 OT Individual Time: 2841-3244 and 0102-7253 and 6644-0347 OT Individual Time Calculation (min): 43 min and 56 min and 27 min   Short Term Goals: Week 1:  OT Short Term Goal 1 (Week 1): Pt will perform BSC/toilet transfer with LRAD MIN A OT Short Term Goal 2 (Week 1): Pt will perform LB dressing sit <> stands level Mod A OT Short Term Goal 3 (Week 1): Pt will perform UB/LB dress with AE PRN with Mod A   Skilled Therapeutic Interventions/Progress Updates:    Session 1: Pt greeted at time of session semireclined in bed resting agreeable to OT session, no pain meds yet and in L hip in 8/10 pain. RN notified and provided pain meds at beginning of session. Supine > sit Supervision with leg lifter and sitting EOB performed UB/LB dressing with Min A for threading LLE only into underwear/pants with pt able to thread RLE and sit > stand to don over hips. Cues for unilateral support instead of both hands pulling up pants as pt states yesterday she "plopped" onto [facility] with nursing staff when trying to do this and caused pain in hip. Stand pivot > recliner and set up for oral hygiene. Alarm on call bell in reach. Discussion throughout session for DC planning and LOS.   Session 2: Pt greeted at time of session sitting up in recliner agreeable to OT session, needing to use bathroom. Ambulated recliner > toilet with bariatric BSC over top with CGA with RW, transferred in same manner. Extended time on toilet for BM, able to perform hygiene in sitting with leans and in standing, CGA/close supervision. Discussion for DME throughout session, does not think she needs bariatric for comfort but would be ok with standard toilet or standard BSC, plan to test both in future session. Note one LOB posteriorly when performing clothing management and Min A to correct. Pt concerned with  DC date, relayed that she has progressed signficantly and will continue to do so in prep for DC home. Discussed as well potential role of home aide that will be caring for husband potentially providing supervision during bathing/dressing for safety. Transported to kitchen and pt ed/training on IADL techniques for accessing fridge with RW and able to retrieve 7/7 items with CGA and good carryover for technique. Set up back in room alarm on call bell in reach.    Session 3: Pt greeted at time of session sitting up in wheelchair with discomfort with movement throughout and rest breaks PRN. Declined toileting, but did provide walker bag at beginning of session and pt very happy about this to accommodate ADLs/IADLs to carry items with RW. Transported to ortho gym and 3 rounds of dynamic standing at [facility] for reaching across midline and out of BOS with unilateral support, ranging from 86-94% accuracy. Unable to stand without support but did try, felt too unstable. Pt enjoying this activity. Transport back to room and ambulated to bed CGA with RW, sit > supine Min A with leg lifter. Alarm on call bell in reach.    Therapy Documentation Precautions:  Precautions Precautions: Fall Restrictions Weight Bearing Restrictions: No LLE Weight Bearing: Weight bearing as tolerated     Therapy/Group: Individual Therapy  Viona Gilmore 07/01/2021, 7:04 AM

## 2021-07-02 NOTE — Progress Notes (Signed)
Physical Therapy Session Note  Patient Details  Name: Carrie Chung MRN: [id number] Date of Birth: February 08, 1941  Today's Date: 07/02/2021 PT Individual Time: 0933-1003 PT Individual Time Calculation (min): 30 min   Short Term Goals: Week 1:  PT Short Term Goal 1 (Week 1): pt will perform bed/chair transfers with CGA and LRAD PT Short Term Goal 2 (Week 1): Pt will initiate gait training with LRAD PT Short Term Goal 3 (Week 1): Pt will initiate stair training  Skilled Therapeutic Interventions/Progress Updates: Pt presented in w/c agreeable to therapy. Pt states she has been feeling better than earlier this am and has also received pain meds. Pt agreeable to work on standing tolerance this session. Pt transported to ortho gym for time management. Participated in dynamic standing activities by participated in several bouts of horseshoes with pt reaching up/down/crossing midline. Pt encouraged to place feet equal space apart and level stance to promote weight shifting onto LLE. After brief seated rest pt ambulated back to room with RW and initially step to fading to step through pattern with supervision and w/c follow. With fatigue pt noted to increase truncal forward flexion to compensate for offloading onto LLE. Pt returned to w/c once at room and remained in w/c at end of session as OT session starting shortly per pt. Pt left in w/c with seat alarm on, call bell within reach and needs met.      Therapy Documentation Precautions:  Precautions Precautions: Fall Restrictions Weight Bearing Restrictions: No LLE Weight Bearing: Weight bearing as tolerated General:   Vital Signs:  Pain: Pain Assessment Pain Score: 3     Therapy/Group: Individual Therapy  Markeia Harkless Cheresa Siers, PTA  07/02/2021, 1:16 PM

## 2021-07-02 NOTE — Plan of Care (Signed)
  Problem: Consults Goal: RH GENERAL PATIENT EDUCATION Description: See Patient Education module for education specifics. Outcome: Progressing Goal: Skin Care Protocol Initiated - if Braden Score 18 or less Description: If consults are not indicated, leave blank or document N/A Outcome: Progressing   Problem: RH SKIN INTEGRITY Goal: RH STG MAINTAIN SKIN INTEGRITY WITH ASSISTANCE Description: STG Maintain Skin Integrity With supervision Assistance. Outcome: Progressing Goal: RH STG ABLE TO PERFORM INCISION/WOUND CARE W/ASSISTANCE Description: STG Able To Perform Incision/Wound Care With supervision Assistance. Outcome: Progressing   Problem: RH SAFETY Goal: RH STG ADHERE TO SAFETY PRECAUTIONS W/ASSISTANCE/DEVICE Description: STG Adhere to Safety Precautions With supervision Assistance/Device. Outcome: Progressing Goal: RH STG DECREASED RISK OF FALL WITH ASSISTANCE Description: STG Decreased Risk of Fall With Assistance. Outcome: Progressing   Problem: RH PAIN MANAGEMENT Goal: RH STG PAIN MANAGED AT OR BELOW PT'S PAIN GOAL Description: < 3 on a 0-10 pain scale. Outcome: Progressing   Problem: RH KNOWLEDGE DEFICIT GENERAL Goal: RH STG INCREASE KNOWLEDGE OF SELF CARE AFTER HOSPITALIZATION Description: Patient will demonstrate knowledge of medication management, pain management, skin/wound care, weight bearing precautions with educational materials and handouts provided by staff independently at discharge. Outcome: Progressing

## 2021-07-02 NOTE — Progress Notes (Signed)
PROGRESS NOTE   Subjective/Complaints:  Pt reports her daughter is worried about swelling of L hip- explained some is due to surgery; some is due to positioning- she is sitting in bed- with her L hip/buttock at base of triangle- feet and head upwards- so causes swelling/edema to accumulate right there.  Didn't sleep well- O2 fell on floor and couldn't get to it.  Orthostatics done -pulse somewhat elevated with sitting and standing compared to laying down- BP did NOT drop with sitting/standing.    ROS:   Pt denies SOB, abd pain, CP, N/V/C/D, and vision changes  Objective:   No results found. No results for input(s): WBC, HGB, HCT, PLT in the last 72 hours.  No results for input(s): NA, K, CL, CO2, GLUCOSE, BUN, CREATININE, CALCIUM in the last 72 hours.   Intake/Output Summary (Last 24 hours) at 07/02/2021 1601 Last data filed at 07/02/2021 1300 Gross per 24 hour  Intake 480 ml  Output --  Net 480 ml        Physical Exam: Vital Signs Blood pressure 118/62, pulse 92, temperature 98 F (36.7 C), resp. rate 18, height 5\' 1"  (1.549 m), weight 73.2 kg, SpO2 97 %.     General: awake, alert, appropriate, but a little more tired/sleepy appearing; NAD HENT: conjugate gaze; oropharynx moist CV: regular rate; no JVD Pulmonary: CTA B/L; no W/R/R- good air movement GI: soft, NT, ND, (+)BS Psychiatric: appropriate; tired Neurological: alert  Skin: 2 L hip incisions C/D/I- but underneath have steristrips- a little dried blood- moderate bruising and edema, but no drainage on wound/banadage. No change today- C/D/I- has moderate edema- and worse right at L hip due to positioning with L hip/buttock at base of "V"-  Neurological:    Comments: Patient is alert.  No acute distress.  Oriented x3 and follows commands. Strength intact except pain limited in LLE.   Assessment/Plan: 1. Functional deficits which require 3+ hours per day  of interdisciplinary therapy in a comprehensive inpatient rehab setting. Physiatrist is providing close team supervision and 24 hour management of active medical problems listed below. Physiatrist and rehab team continue to assess barriers to discharge/monitor patient progress toward functional and medical goals  Care Tool:  Bathing    Body parts bathed by patient: Right arm, Left arm, Chest, Abdomen, Right upper leg, Left upper leg, Face, Front perineal area, Buttocks, Right lower leg   Body parts bathed by helper: Buttocks, Right lower leg, Left lower leg, Front perineal area     Bathing assist Assist Level: Contact Guard/Touching assist     Upper Body Dressing/Undressing Upper body dressing   What is the patient wearing?: Pull over shirt, Bra    Upper body assist Assist Level: Set up assist    Lower Body Dressing/Undressing Lower body dressing      What is the patient wearing?: Pants, Underwear/pull up     Lower body assist Assist for lower body dressing: Minimal Assistance - Patient > 75%     Toileting Toileting Toileting Activity did not occur (Clothing management and hygiene only):  (Patient voided in brief and had stool)  Toileting assist Assist for toileting: Moderate Assistance - Patient 50 -  74%     Transfers Chair/bed transfer  Transfers assist  Chair/bed transfer activity did not occur: Safety/medical concerns  Chair/bed transfer assist level: Contact Guard/Touching assist     Locomotion Ambulation   Ambulation assist   Ambulation activity did not occur: Safety/medical concerns  Assist level: Supervision/Verbal cueing Assistive device: Walker-rolling Max distance: 159ft   Walk 10 feet activity   Assist  Walk 10 feet activity did not occur: Safety/medical concerns  Assist level: Supervision/Verbal cueing Assistive device: Walker-rolling   Walk 50 feet activity   Assist Walk 50 feet with 2 turns activity did not occur: Safety/medical  concerns  Assist level: Supervision/Verbal cueing Assistive device: Walker-rolling    Walk 150 feet activity   Assist Walk 150 feet activity did not occur: Safety/medical concerns  Assist level: Supervision/Verbal cueing Assistive device: Walker-rolling    Walk 10 feet on uneven surface  activity   Assist Walk 10 feet on uneven surfaces activity did not occur: Safety/medical concerns         Wheelchair     Assist Will patient use wheelchair at discharge?: Yes Type of Wheelchair: Manual Wheelchair activity did not occur: Safety/medical concerns  Wheelchair assist level: Supervision/Verbal cueing Max wheelchair distance: 134 ft    Wheelchair 50 feet with 2 turns activity    Assist    Wheelchair 50 feet with 2 turns activity did not occur: Safety/medical concerns   Assist Level: Supervision/Verbal cueing   Wheelchair 150 feet activity     Assist  Wheelchair 150 feet activity did not occur: Safety/medical concerns       Blood pressure 118/62, pulse 92, temperature 98 F (36.7 C), resp. rate 18, height 5\' 1"  (1.549 m), weight 73.2 kg, SpO2 97 %.    Medical Problem List and Plan: 1.   Debility secondary to left femoral intertrochanteric hip fracture.  Status post IM nailing 06/20/2021.  Weightbearing as tolerated             -patient may shower but incision must be covered             -ELOS/Goals: 2-3 weeks modI            Con't PT and OT- d/c date 7/20- is scared won't be ready- but mobilizing well.  2.  Impaired mobility: continue SCDs.               -antiplatelet therapy: continue Aspirin 81 mg daily and Plavix 75 mg daily 3. Post-operative left hip pain: Continue Oxycodone and Robaxin as needed. History of negative side effects to gabapentin. Placed order to request that oxycodone be administered 30 minutes prior to therapy sessions  7/12- will have pt take 10 mg Oxy q4 hours prn- she had been trying to avoid taking unless had to- explained that she  will do better in therapy if pain better controlled- will get off meds- I promise, but needs for now.   7/13- pain doing better with increase in Oxy- con't regimen 4. Anxiety: Continue Effexor 75 mg daily, Xanax 1 mg- changed to scheduled. Discussed risk of respiratory depression with concurrent use of Xanax and oxycodone- she has been tolerating both together.              -antipsychotic agents: N/A 5. Neuropsych: This patient is capable of making decisions on her own behalf. 6. Skin/Wound Care: Routine skin checks 7. Fluids/Electrolytes/Nutrition: Routine in and outs with follow-up chemistries 8.  Acute blood loss anemia.  CBC stable. Continue to monitor weekly.  7/13- Hb stable at 8.5- con't to monitor weekly.  9.  CAD with history of multiple stents.  Continue aspirin Plavix 10.  Hyperlipidemia.  Continue Crestor 11.  Nocturnal oxygen requirements.  Continue oxygen as prior to admission  7/13- con't nightly- O2 drops if doesn't wear 12.  Hypertension.  Well controlled, decrease lopressor to 12.5mg  BID.  Monitor with increased mobility  7/12- BP well controlled- however having dizziness- see #14 for more.-con't regimen for now.   7/13- orthostatics looked good- didn't drop, except pulse got very slightly more- will con't to monitor 13.  Constipation.  Continue laxative assistance as directed 14. Lightheaded  7/12- will order Orthostatics to be done- see if it's (+)- if so, will decrease Lopressor- also have pt drink more- she's only drinking 4 cups/water daily- will increase to at least 6 cups/day.   7/13- Cr looks OK last week- will recheck labs in AM   LOS: 8 days A FACE TO FACE EVALUATION WAS PERFORMED  Maeghan Canny 07/02/2021, 4:01 PM

## 2021-07-02 NOTE — Progress Notes (Signed)
Physical Therapy Weekly Progress Note  Patient Details  Name: Carrie Chung MRN: [id number] Date of Birth: 1941/03/03  Beginning of progress report period: June 26, 2021 End of progress report period: July 02, 2021  Today's Date: 07/02/2021 PT Individual Time: 0800-0900, 3007-6226 PT Individual Time Calculation (min): 60 min, 62 min  Patient has met 3 of 3 short term goals.  Bed mobility with supervision, transfers and short distance gait with supervision RW and supervision. Pt making progress toward mod I with increased WB on LLE. Pt still limited by pain and endurance.   Patient continues to demonstrate the following deficits muscle weakness and decreased cardiorespiratoy endurance and therefore will continue to benefit from skilled PT intervention to increase functional independence with mobility.  Patient progressing toward long term goals..  Continue plan of care.  PT Short Term Goals Week 1:  PT Short Term Goal 1 (Week 1): pt will perform bed/chair transfers with CGA and LRAD PT Short Term Goal 1 - Progress (Week 1): Met PT Short Term Goal 2 (Week 1): Pt will initiate gait training with LRAD PT Short Term Goal 2 - Progress (Week 1): Met PT Short Term Goal 3 (Week 1): Pt will initiate stair training PT Short Term Goal 3 - Progress (Week 1): Met Week 2:  PT Short Term Goal 1 (Week 2): =LTGs due to ELOS  Skilled Therapeutic Interventions/Progress Updates:   Session 1: pt received in bed and agreeable to therapy. States she has not had pain meds yet this morning and has some pain, unrated. Nursing administered medication during session and pt was left with ice pack in place at end of session. Pt was CGA fading to supervision for all mobility this session. Bed mobility supervision with leg lifter. Stand pivot transfer with RW and CGA. Pt propelled w/c in room to collect clothing. Donned shirt and bra mod I, assistance to thread LLE and then CGA to stand and pull pants over hips. Pt  brushed teeth and combed hair at sink seated in w/c. Session focused on w/c mobility for endurance. Pt propelled w/c with BUE x 150 ft to gym. Navigated ramp x 2 with w/c, min A to get drive wheels onto ramp. Pt then propelled w/c x 250 ft without rest break to return to room. Pt remained in w/c after session with all needs in reach.   Session 2: Pt received in recliner and agreeable to therapy.  Pain up to 8/10 with activity, nsg administered medication at start of session. Pt performed Sit to stand and ambulatory transfers with RW and close supervision throughout session. Pt propelled w/c with BUE x 150 ft with supervision. ambulatory transfer to mat table. Pt performed the following exercises to promote LE strength and endurance: mini squats, standing hamstring curl with LLE, standing abduction with LLE. Pt then ambulated with  2 x 90 ft, x 50 ft with RW and close supervision. Pt then propelled chair to room. ambulatory transfer to recliner with RW and supervision. Pt remained seated in recliner after session with all needs in reach.  Therapy Documentation Precautions:  Precautions Precautions: Fall Restrictions Weight Bearing Restrictions: No LLE Weight Bearing: Weight bearing as tolerated    Therapy/Group: Individual Therapy  Mickel Fuchs 07/02/2021, 7:50 AM

## 2021-07-02 NOTE — Progress Notes (Signed)
Occupational Therapy Session Note  Patient Details  Name: Carrie Chung MRN: [id number] Date of Birth: 1941-08-07  Today's Date: 07/02/2021 OT Individual Time: 1100-1158 OT Individual Time Calculation (min): 58 min    Short Term Goals: Week 1:  OT Short Term Goal 1 (Week 1): Pt will perform BSC/toilet transfer with LRAD MIN A OT Short Term Goal 2 (Week 1): Pt will perform LB dressing sit <> stands level Mod A OT Short Term Goal 3 (Week 1): Pt will perform UB/LB dress with AE PRN with Mod A   Skilled Therapeutic Interventions/Progress Updates:    Pt greeted at time of session sitting up in wheelchair agreeable to OT session and minimal pain in L hip with stand > sits throughout session and rest breaks PRN. Self propel room > ADL apartment and focus of session on IADL retraining with walker bag retrieving items from fridge with Supervision almost Mod I holding on to firm surface for safety when reaching for further items. Item retrieval from cabinet overhead/underneath and simulated microwave over stove as well with cues for hand placement and body mechanics in prep for DC home. Extensive discussion as well with pt for problem solving set up of home for living in downstairs bed room and tasks to be taken care of. Pt transported back to room and set up alarm on call bell in reach.   Therapy Documentation Precautions:  Precautions Precautions: Fall Restrictions Weight Bearing Restrictions: No LLE Weight Bearing: Weight bearing as tolerated    Therapy/Group: Individual Therapy  Viona Gilmore 07/02/2021, 7:27 AM

## 2021-07-03 LAB — CBC WITH DIFFERENTIAL/PLATELET
Abs Immature Granulocytes: 0.03 10*3/uL (ref 0.00–0.07)
Basophils Absolute: 0 10*3/uL (ref 0.0–0.1)
Basophils Relative: 0 %
Eosinophils Absolute: 0.1 10*3/uL (ref 0.0–0.5)
Eosinophils Relative: 2 %
HCT: 25.2 % — ABNORMAL LOW (ref 36.0–46.0)
Hemoglobin: 8 g/dL — ABNORMAL LOW (ref 12.0–15.0)
Immature Granulocytes: 1 %
Lymphocytes Relative: 24 %
Lymphs Abs: 1.4 10*3/uL (ref 0.7–4.0)
MCH: 32.1 pg (ref 26.0–34.0)
MCHC: 31.7 g/dL (ref 30.0–36.0)
MCV: 101.2 fL — ABNORMAL HIGH (ref 80.0–100.0)
Monocytes Absolute: 0.5 10*3/uL (ref 0.1–1.0)
Monocytes Relative: 9 %
Neutro Abs: 3.7 10*3/uL (ref 1.7–7.7)
Neutrophils Relative %: 64 %
Platelets: 216 10*3/uL (ref 150–400)
RBC: 2.49 MIL/uL — ABNORMAL LOW (ref 3.87–5.11)
RDW: 14.6 % (ref 11.5–15.5)
WBC: 5.8 10*3/uL (ref 4.0–10.5)
nRBC: 0 % (ref 0.0–0.2)

## 2021-07-03 LAB — BASIC METABOLIC PANEL
Anion gap: 5 (ref 5–15)
BUN: 8 mg/dL (ref 8–23)
CO2: 31 mmol/L (ref 22–32)
Calcium: 8.6 mg/dL — ABNORMAL LOW (ref 8.9–10.3)
Chloride: 102 mmol/L (ref 98–111)
Creatinine, Ser: 0.57 mg/dL (ref 0.44–1.00)
GFR, Estimated: >60 mL/min (ref >60)
Glucose, Bld: 131 mg/dL — ABNORMAL HIGH (ref 70–99)
Potassium: 4.2 mmol/L (ref 3.5–5.1)
Sodium: 138 mmol/L (ref 135–145)

## 2021-07-03 NOTE — Progress Notes (Addendum)
PROGRESS NOTE   Subjective/Complaints:  Pt reports orthostatics were slightly off "yesterday"- the ones I saw were good- from yesterday AM.  She notes they did again this AM- again, BP went UP, slightly, not down and pulse went up slightly.   Notes fell asleep without O2- since reading.  Trying to drink more- not sure if being successful.  "Croaks in AM"- thinks it's allergies.   ROS:   Pt denies SOB, abd pain, CP, N/V/C/D, and vision changes   Objective:   No results found. Recent Labs    07/03/21 0511  WBC 5.8  HGB 8.0*  HCT 25.2*  PLT 216    Recent Labs    07/03/21 0511  NA 138  K 4.2  CL 102  CO2 31  GLUCOSE 131*  BUN 8  CREATININE 0.57  CALCIUM 8.6*     Intake/Output Summary (Last 24 hours) at 07/03/2021 1024 Last data filed at 07/03/2021 0900 Gross per 24 hour  Intake 580 ml  Output --  Net 580 ml        Physical Exam: Vital Signs Blood pressure 136/72, pulse 94, temperature 97.8 F (36.6 C), resp. rate 18, height 5\' 1"  (1.549 m), weight 73.2 kg, SpO2 94 %.      General: awake, alert, appropriate, sitting up with legs elevated in bed- high risk for edema formation; NAD- voice "croaky".  HENT: conjugate gaze; oropharynx moist CV: regular rate; no JVD Pulmonary: CTA B/L; no W/R/R- good air movement GI: soft, NT, ND, (+)BS Psychiatric: appropriate- asking a lot of appropriate questions Neurological: Ox3 Skin: 2 L hip incisions C/D/I- but underneath have steristrips- a little dried blood- moderate bruising and edema, but no drainage on wound/banadage.  C/D/I- has moderate edema- and worse right at L hip due to positioning with L hip/buttock at base of "V"- looks the same today Neurological:    Comments: Patient is alert.  No acute distress.  Oriented x3 and follows commands. Strength intact except pain limited in LLE.   Assessment/Plan: 1. Functional deficits which require 3+ hours per  day of interdisciplinary therapy in a comprehensive inpatient rehab setting. Physiatrist is providing close team supervision and 24 hour management of active medical problems listed below. Physiatrist and rehab team continue to assess barriers to discharge/monitor patient progress toward functional and medical goals  Care Tool:  Bathing    Body parts bathed by patient: Right arm, Left arm, Chest, Abdomen, Right upper leg, Left upper leg, Face, Front perineal area, Buttocks, Right lower leg   Body parts bathed by helper: Buttocks, Right lower leg, Left lower leg, Front perineal area     Bathing assist Assist Level: Contact Guard/Touching assist     Upper Body Dressing/Undressing Upper body dressing   What is the patient wearing?: Pull over shirt, Bra    Upper body assist Assist Level: Set up assist    Lower Body Dressing/Undressing Lower body dressing      What is the patient wearing?: Pants, Underwear/pull up     Lower body assist Assist for lower body dressing: Minimal Assistance - Patient > 75%     Toileting Toileting Toileting Activity did not occur Landscape architect and  hygiene only):  (Patient voided in brief and had stool)  Toileting assist Assist for toileting: Moderate Assistance - Patient 50 - 74%     Transfers Chair/bed transfer  Transfers assist  Chair/bed transfer activity did not occur: Safety/medical concerns  Chair/bed transfer assist level: Contact Guard/Touching assist     Locomotion Ambulation   Ambulation assist   Ambulation activity did not occur: Safety/medical concerns  Assist level: Supervision/Verbal cueing Assistive device: Walker-rolling Max distance: 154ft   Walk 10 feet activity   Assist  Walk 10 feet activity did not occur: Safety/medical concerns  Assist level: Supervision/Verbal cueing Assistive device: Walker-rolling   Walk 50 feet activity   Assist Walk 50 feet with 2 turns activity did not occur:  Safety/medical concerns  Assist level: Supervision/Verbal cueing Assistive device: Walker-rolling    Walk 150 feet activity   Assist Walk 150 feet activity did not occur: Safety/medical concerns  Assist level: Supervision/Verbal cueing Assistive device: Walker-rolling    Walk 10 feet on uneven surface  activity   Assist Walk 10 feet on uneven surfaces activity did not occur: Safety/medical concerns         Wheelchair     Assist Will patient use wheelchair at discharge?: Yes Type of Wheelchair: Manual Wheelchair activity did not occur: Safety/medical concerns  Wheelchair assist level: Supervision/Verbal cueing Max wheelchair distance: 134 ft    Wheelchair 50 feet with 2 turns activity    Assist    Wheelchair 50 feet with 2 turns activity did not occur: Safety/medical concerns   Assist Level: Supervision/Verbal cueing   Wheelchair 150 feet activity     Assist  Wheelchair 150 feet activity did not occur: Safety/medical concerns       Blood pressure 136/72, pulse 94, temperature 97.8 F (36.6 C), resp. rate 18, height 5\' 1"  (1.549 m), weight 73.2 kg, SpO2 94 %.    Medical Problem List and Plan: 1.   Debility secondary to left femoral intertrochanteric hip fracture.  Status post IM nailing 06/20/2021.  Weightbearing as tolerated             -patient may shower but incision must be covered             -ELOS/Goals: 2-3 weeks modI            Con't PT and OT- d/c date 7/20- is scared won't be ready- but mobilizing well.   -con't PT and OT- spent time talking with pt 2x about that if she's doesn't meet our goals (her goal is to not need w/c and to be Mod I)- goals of Supervision by d/c. Will still need help at d/c.  2.  Impaired mobility: continue SCDs.               -antiplatelet therapy: continue Aspirin 81 mg daily and Plavix 75 mg daily 3. Post-operative left hip pain: Continue Oxycodone and Robaxin as needed. History of negative side effects to  gabapentin. Placed order to request that oxycodone be administered 30 minutes prior to therapy sessions  7/12- will have pt take 10 mg Oxy q4 hours prn- she had been trying to avoid taking unless had to- explained that she will do better in therapy if pain better controlled- will get off meds- I promise, but needs for now.   7/14- Pt's pain controlled most of time- but still does have spikes in pain-  4. Anxiety: Continue Effexor 75 mg daily, Xanax 1 mg- changed to scheduled. Discussed risk of respiratory depression with concurrent  use of Xanax and oxycodone- she has been tolerating both together.              -antipsychotic agents: N/A 5. Neuropsych: This patient is capable of making decisions on her own behalf. 6. Skin/Wound Care: Routine skin checks 7. Fluids/Electrolytes/Nutrition: Routine in and outs with follow-up chemistries 8.  Acute blood loss anemia.  CBC stable. Continue to monitor weekly.   7/13- Hb stable at 8.5- con't to monitor weekly. 7/14- Hb 8.0- down slightly from 7/6, however says has been drinking more- can hemodilute.   9.  CAD with history of multiple stents.  Continue aspirin Plavix 10.  Hyperlipidemia.  Continue Crestor 11.  Nocturnal oxygen requirements.  Continue oxygen as prior to admission  7/14- O2 drops at night when doesn't wear O2- encouraged to wear at night.  12.  Hypertension.  Well controlled, decrease lopressor to 12.5mg  BID.  Monitor with increased mobility  7/12- BP well controlled- however having dizziness- see #14 for more.-con't regimen for now.   7/13- orthostatics looked good- didn't drop, except pulse got very slightly more- will con't to monitor  7/14- BP went up with AM orthostatics and pulse very slightly more, but not significant- con't to monitor 13.  Constipation.  Continue laxative assistance as directed 14. Lightheaded  7/12- will order Orthostatics to be done- see if it's (+)- if so, will decrease Lopressor- also have pt drink more- she's  only drinking 4 cups/water daily- will increase to at least 6 cups/day.   7/13- Cr looks OK last week- will recheck labs in AM  7/14- Cr stable at 0.57 and BUN 8   I spent a total of 35 minutes on visit today- saw 2x and discussed in front of PT and reaffirmed that swelling is great comparatively to other pts with same fracture; just need to not elevate feet/so gets more swelling around L hip; pain is normal and has already improved since I first saw her Monday- if she doesn't meet goal by Wednesday, will d/c later, however I truly think she will- she's already walked 150 ft with RW minA/CGA.  SW to call daughter to reaffirm these issues.   LOS: 9 days A FACE TO FACE EVALUATION WAS PERFORMED  Isaak Delmundo 07/03/2021, 10:24 AM

## 2021-07-03 NOTE — Progress Notes (Signed)
Occupational Therapy Weekly Progress Note  Patient Details  Name: Carrie Chung MRN: [id number] Date of Birth: 03-25-1941  Beginning of progress report period: June 25, 2021 End of progress report period: July 03, 2021  Today's Date: 07/03/2021 OT Individual Time: 1000-1027 and 1440-1530 OT Individual Time Calculation (min): 27 min and 50 min Missed 10 minutes at beginning of session d/t previous pt care   Patient has met 3 of 3 short term goals.  Pt is overall Supervision to occasional CGA for ADL transfers to toilet, bed, wheelchair, and various other surfaces. Pt is ambulating in room and throughout hall for functional mobility with RW close supervision. Pt is occasional Min A for threading RLE in to pants/underwear but otherwise at Supervision level for LB bathing/dressing. Pt is bathing at shower level with use of LHS for AE. Pt continues to have cardiorespiratory endurance deficits and plan to continue to work on dynamic standing balance and IADL skill performance.   Patient continues to demonstrate the following deficits: muscle weakness, decreased cardiorespiratoy endurance, unbalanced muscle activation, decreased coordination, and decreased motor planning, and decreased standing balance, decreased postural control, and decreased balance strategies and therefore will continue to benefit from skilled OT intervention to enhance overall performance with BADL, iADL, and Reduce care partner burden.  Patient progressing toward long term goals..  Continue plan of care.  OT Short Term Goals Week 1:  OT Short Term Goal 1 (Week 1): Pt will perform BSC/toilet transfer with LRAD MIN A OT Short Term Goal 1 - Progress (Week 1): Met OT Short Term Goal 2 (Week 1): Pt will perform LB dressing sit <> stands level Mod A OT Short Term Goal 2 - Progress (Week 1): Met OT Short Term Goal 3 (Week 1): Pt will perform UB/LB dress with AE PRN with Mod A OT Short Term Goal 3 - Progress (Week 1): Met Week  2:  OT Short Term Goal 1 (Week 2): STGs = LTGs d/t ELOS  Skilled Therapeutic Interventions/Progress Updates:    Session 1: Pt greeted at time of session sitting up in wheelchair and hand off from PT just finishing session. Continued discussion from previous PT session regarding [place] vs OP (pt now okay with OP as she can get transport), anxiety concerns regarding DC home. Remainder of session spent in therapeutic listening and conversation with pt regarding anxieties going home and problem solving adaptations to make. Stand pivot > recliner close supervision with RW, call bell in reach all needs met.    Session 2: Pt greeted at time of session 10 minutes late from previous patient care in earlier session. Pt up in recliner and initial part of session spent discussing DC planning again as pt is very anxious about choosing right f/u for therapy and having transportation. Stand pivot recliner <> wheelchair Supervision with RW and self propel to gym Mod I/supervision with BUE propulsion. Performed dynamic standing activity simulating loading items in a dishwasher reaching with R hand and placing on a low surface with L and vice versa with pt very anxious to release RW, but has done so in previous ADL functional tasks. Also able to pick up cone off floor level with squatting CGA when dropped one. Transported back to room and transferred as above. Up in recliner alarm on call bell in reach.   Therapy Documentation Precautions:  Precautions Precautions: Fall Restrictions Weight Bearing Restrictions: No LLE Weight Bearing: Weight bearing as tolerated    Therapy/Group: Individual Therapy  Viona Gilmore 07/03/2021, 7:23  AM  

## 2021-07-03 NOTE — Progress Notes (Signed)
Physical Therapy Session Note  Patient Details  Name: Carrie Chung MRN: [id number] Date of Birth: 23-Dec-1940  Today's Date: 07/03/2021 PT Individual Time: 0900-1000 PT Individual Time Calculation (min): 60 min   Short Term Goals: Week 1:  PT Short Term Goal 1 (Week 1): pt will perform bed/chair transfers with CGA and LRAD PT Short Term Goal 1 - Progress (Week 1): Met PT Short Term Goal 2 (Week 1): Pt will initiate gait training with LRAD PT Short Term Goal 2 - Progress (Week 1): Met PT Short Term Goal 3 (Week 1): Pt will initiate stair training PT Short Term Goal 3 - Progress (Week 1): Met Week 2:  PT Short Term Goal 1 (Week 2): =LTGs due to ELOS  Skilled Therapeutic Interventions/Progress Updates:    pt received in bed and agreeable to therapy. No complaint of pain. Pt states she received pain medication shortly before session. Bed mobility with supervision, Sit to stand with RW and supervision. Pt ambulated in room distance to pick out clothes with CGA and RW. Donned bra and shirt set up assist, supervision to don pants with reacher and pull pants over hips in standing. Pt stood at sink to brush teeth and comb hair. Pt propelled w/c with BUE x 250 ft to day room mod I for endurance, pt able to speak fluently without becoming short of breath. ambulatory transfer to nu step, mod I with RW. Nustep x 80 steps at level 4. Pt reported slight twinge in her hip at times, able to tolerate. Pt propelled w/c with BUE x 150 ft. Discussed d/c planning with MD and pt. Pt is concerned she will not be ready for d/c by planned date. Discussed CLOF and that she is on track for meeting goals. Pt handed off to OT at end of session.   Therapy Documentation Precautions:  Precautions Precautions: Fall Restrictions Weight Bearing Restrictions: No LLE Weight Bearing: Weight bearing as tolerated    Therapy/Group: Individual Therapy  Mickel Fuchs 07/03/2021, 8:00 AM

## 2021-07-03 NOTE — Progress Notes (Addendum)
Patient ID: KIYRA SLAUBAUGH, female   DOB: [birth year], 80 y.o.   MRN: [id number] Met with po yesterday who expressed concerns regarding being ready to go home on 7/20. Discussed team will continue to discuss but her current recommendation is supervision level due to instability. She will talk with MD and wanted this worker to call daughter. Have called daughter and left a message for her. Will continue to work on discharge needs.  3:00 PM Met with pt and spoke with daughter via telephone to discuss concerns and issues. Daughter has ordered ramps for the home and is concerned pt does not feel ready or comfortable with the discharge date. Pt feels she is progressing but can not visualize her doing for herself. Pain is better manage if pt asks for pain meds 30-40 minutes prior to therapy session. Will talk with team and MD regarding extending discharge a few days for pt.  3:10 PM MD and OT are fine with discharge 7/22 and will check with PT.

## 2021-07-03 NOTE — Progress Notes (Signed)
Occupational Therapy Session Note  Patient Details  Name: Carrie Chung MRN: [id number] Date of Birth: [birth year]  Today's Date: 07/03/2021 OT Individual Time: 6962-9528 OT Individual Time Calculation (min): 30 min    Short Term Goals: Week 1:  OT Short Term Goal 1 (Week 1): Pt will perform BSC/toilet transfer with LRAD MIN A OT Short Term Goal 2 (Week 1): Pt will perform LB dressing sit <> stands level Mod A OT Short Term Goal 3 (Week 1): Pt will perform UB/LB dress with AE PRN with Mod A  Skilled Therapeutic Interventions/Progress Updates:    Pt received seated in recliner, agreeable to therapy. Session focus on self-care retraining, activity tolerance, BLE strengthening, func mobility, and pain management in prep for improved ADL/IADL/func mobility performance + decreased caregiver burden. STS and amb to and from recliner > w/c with RW and CGA. W/c transport to and from gym in w/c 2/2 time management and energy conservation. Seated at [facility], completed 5 min at level 80 cm/sec with min A to facilitate LLE. C/o ongoing L hip/knee pain that worsens with 90 degrees L knee flexion. RN notified. Discussed routine management and nonpharm pain strategies (continued use of K tape, medication timing, positioning, etc.) as pt reports her primary concern is her LLE pain prior to DC. Stand-pivot back to recliner CGA. RN notified of pt pain and req for rx.   Pt left seated in recliner call bell in reach, and all immediate needs met.    Therapy Documentation Precautions:  Precautions Precautions: Fall Restrictions Weight Bearing Restrictions: No LLE Weight Bearing: Weight bearing as tolerated  Pain: see session note   ADL: See Care Tool for more details.  Therapy/Group: Individual Therapy  Volanda Napoleon MS, OTR/L  07/03/2021, 6:51 AM

## 2021-07-03 NOTE — Progress Notes (Signed)
Occupational Therapy Session Note  Patient Details  Name: Carrie Chung MRN: [id number] Date of Birth: 1941/04/28  Today's Date: 07/03/2021 OT Individual Time: 1130-1200 OT Individual Time Calculation (min): 30 min    Short Term Goals: Week 1:  OT Short Term Goal 1 (Week 1): Pt will perform BSC/toilet transfer with LRAD MIN A OT Short Term Goal 1 - Progress (Week 1): Met OT Short Term Goal 2 (Week 1): Pt will perform LB dressing sit <> stands level Mod A OT Short Term Goal 2 - Progress (Week 1): Met OT Short Term Goal 3 (Week 1): Pt will perform UB/LB dress with AE PRN with Mod A OT Short Term Goal 3 - Progress (Week 1): Met  Skilled Therapeutic Interventions/Progress Updates:     Pt received in  recliner agreeabl eot OT with unrated hip pain in LLE. Pt agreeable to try K tape for edema management. Pt completes doffing/donning pants over hips with S overall. OT applies K tape in lantern pattern and educates on other pattern to try another day if edema remains. Pt left at end of session in recliner with exit alarm on, call light in reach and all needs met   Therapy Documentation Precautions:  Precautions Precautions: Fall Restrictions Weight Bearing Restrictions: No LLE Weight Bearing: Weight bearing as tolerated General:   Vital Signs: Therapy Vitals Temp: 97.8 F (36.6 C) Pulse Rate: 94 Resp: 18 BP: 136/72 Patient Position (if appropriate): Sitting Oxygen Therapy SpO2: 94 % O2 Device: Room Air Pain:   ADL: ADL Eating: Not assessed Grooming: Supervision/safety Upper Body Bathing: Minimal assistance (simulated) Lower Body Bathing: Maximal assistance Upper Body Dressing: Minimal assistance Lower Body Dressing: Dependent Toileting: Dependent Toilet Transfer: Not assessed (NA d/t pain) Vision   Perception    Praxis   Exercises:   Other Treatments:     Therapy/Group: Individual Therapy  Tonny Branch 07/03/2021, 6:59 AM

## 2021-07-04 NOTE — Progress Notes (Signed)
Physical Therapy Session Note  Patient Details  Name: Carrie Chung MRN: [id number] Date of Birth: 1941/06/28  Today's Date: 07/04/2021 PT Individual Time: 0800-0907 PT Individual Time Calculation (min): 67 min   Short Term Goals: Week 1:  PT Short Term Goal 1 (Week 1): pt will perform bed/chair transfers with CGA and LRAD PT Short Term Goal 1 - Progress (Week 1): Met PT Short Term Goal 2 (Week 1): Pt will initiate gait training with LRAD PT Short Term Goal 2 - Progress (Week 1): Met PT Short Term Goal 3 (Week 1): Pt will initiate stair training PT Short Term Goal 3 - Progress (Week 1): Met Week 2:  PT Short Term Goal 1 (Week 2): =LTGs due to ELOS  Skilled Therapeutic Interventions/Progress Updates:    pt received in bed and agreeable to therapy. Pt reports extreme pain this morning, but that she received pain medication before session and was beginning to feel better. Pt reported emotional distress 2/2 pain and premorbid anxiety/depression. Session focused on self care (dressing and oral care) and discussing coping strategies. Pt ambulated in room distances with RW to gather clothing, donned bra, shirt, and pants seated EOB with supervision. Pt then stood at sink to brush teeth and comb hair. Discussed pt's anxiety about discharge, pain science, getting in touch with her therapist to set up a phone call to manage mental health, coping strategies. Pt receptive and grateful for opportunity to talk through her emotional state. Pt propelled w/c with BUE  x100 ft for endurance and functional mobility. Gait with RW and close supervision 2 x 136 ft. Pt demoed step through pattern, antalgic gait. Pt returned to room and remained seated in w/c 2/2 pending PT session. Alarm set and all needs in reach.  Therapy Documentation Precautions:  Precautions Precautions: Fall Restrictions Weight Bearing Restrictions: No LLE Weight Bearing: Weight bearing as tolerated   Therapy/Group: Individual  Therapy  Mickel Fuchs 07/04/2021, 12:55 PM

## 2021-07-04 NOTE — Progress Notes (Signed)
Occupational Therapy Session Note  Patient Details  Name: Carrie Chung MRN: [id number] Date of Birth: 01/18/1941  Today's Date: 07/04/2021 OT Group Time: 1330-1430 OT Group Time Calculation (min): 60 min   Short Term Goals: Week 1:  OT Short Term Goal 1 (Week 1): Pt will perform BSC/toilet transfer with LRAD MIN A OT Short Term Goal 1 - Progress (Week 1): Met OT Short Term Goal 2 (Week 1): Pt will perform LB dressing sit <> stands level Mod A OT Short Term Goal 2 - Progress (Week 1): Met OT Short Term Goal 3 (Week 1): Pt will perform UB/LB dress with AE PRN with Mod A OT Short Term Goal 3 - Progress (Week 1): Met  Skilled Therapeutic Interventions/Progress Updates:    Pt participated in rhythmic drumming group. Pain L hip unrated but repositioned for comfort at end of session in recliner. Focus of group on BUE coordination, strengthening, endurance, timing/control, activity tolerance, and social participation and engagement. Pt performs session from seated position for energy conservation. Skilled interventions included larger ROM cues to imrpve BUE strengthening and self initiated rest breaks d/t decreased endurance. Warm up performed prior to exercises and UB stretching completed at end of group with demo from OT. Pt able to select preferred song to share with group. Returned pt to room at end of session. Exited session with pt seated in recliner and RN in room   Therapy Documentation Precautions:  Precautions Precautions: Fall Restrictions Weight Bearing Restrictions: No LLE Weight Bearing: Weight bearing as tolerated General:   Vital Signs: Therapy Vitals Temp: 98.2 F (36.8 C) Temp Source: Oral Pulse Rate: 94 Resp: 16 BP: 114/67 Patient Position (if appropriate): Orthostatic Vitals (Pt refused standing and sitting.) Oxygen Therapy SpO2: 99 % O2 Device: Nasal Cannula O2 Flow Rate (L/min): 2 L/min Pain:   ADL: ADL Eating: Not assessed Grooming:  Supervision/safety Upper Body Bathing: Minimal assistance (simulated) Lower Body Bathing: Maximal assistance Upper Body Dressing: Minimal assistance Lower Body Dressing: Dependent Toileting: Dependent Toilet Transfer: Not assessed (NA d/t pain) Vision   Perception    Praxis   Exercises:   Other Treatments:     Therapy/Group: Group Therapy  Tonny Branch 07/04/2021, 6:53 AM

## 2021-07-04 NOTE — Progress Notes (Signed)
PROGRESS NOTE   Subjective/Complaints:  Pt reports not doing well- hadn't had pain meds overnight and waiting for nursing to bring this AM- walking to/from bathroom to void.  LBM yesterday Said also woke up and went back to sleep- which "is never good".   D/c extended to next Friday.   ROS:   Pt denies SOB, abd pain, CP, N/V/C/D, and vision changes   Objective:   No results found. Recent Labs    07/03/21 0511  WBC 5.8  HGB 8.0*  HCT 25.2*  PLT 216    Recent Labs    07/03/21 0511  NA 138  K 4.2  CL 102  CO2 31  GLUCOSE 131*  BUN 8  CREATININE 0.57  CALCIUM 8.6*     Intake/Output Summary (Last 24 hours) at 07/04/2021 0851 Last data filed at 07/04/2021 0735 Gross per 24 hour  Intake 720 ml  Output --  Net 720 ml        Physical Exam: Vital Signs Blood pressure 114/67, pulse 94, temperature 98.2 F (36.8 C), temperature source Oral, resp. rate 16, height 5\' 1"  (1.549 m), weight 73.2 kg, SpO2 99 %.       General: awake, alert, appropriate, walking to/from bathroom with nursing to go void- walking well in spite of pain;  NAD HENT: conjugate gaze; oropharynx moist CV: regular rate; no JVD Pulmonary: CTA B/L; no W/R/R- good air movement GI: soft, NT, ND, (+)BS Psychiatric: appropriate but irritable due to pain Neurological: alert Skin: 2 L hip incisions C/D/I- but underneath have steristrips- a little dried blood- moderate bruising and edema, but no drainage on wound/banadage.  C/D/I- has moderate edema- and worse right at L hip due to positioning with L hip/buttock at base of "V"- looks the same today Neurological:    Comments: Patient is alert.  No acute distress.  Oriented x3 and follows commands. Strength intact except pain limited in LLE.   Assessment/Plan: 1. Functional deficits which require 3+ hours per day of interdisciplinary therapy in a comprehensive inpatient rehab  setting. Physiatrist is providing close team supervision and 24 hour management of active medical problems listed below. Physiatrist and rehab team continue to assess barriers to discharge/monitor patient progress toward functional and medical goals  Care Tool:  Bathing    Body parts bathed by patient: Right arm, Left arm, Chest, Abdomen, Right upper leg, Left upper leg, Face, Front perineal area, Buttocks, Right lower leg   Body parts bathed by helper: Buttocks, Right lower leg, Left lower leg, Front perineal area     Bathing assist Assist Level: Contact Guard/Touching assist     Upper Body Dressing/Undressing Upper body dressing   What is the patient wearing?: Pull over shirt, Bra    Upper body assist Assist Level: Set up assist    Lower Body Dressing/Undressing Lower body dressing      What is the patient wearing?: Pants, Underwear/pull up     Lower body assist Assist for lower body dressing: Minimal Assistance - Patient > 75%     Toileting Toileting Toileting Activity did not occur (Clothing management and hygiene only):  (Patient voided in brief and had stool)  Toileting assist  Assist for toileting: Moderate Assistance - Patient 50 - 74%     Transfers Chair/bed transfer  Transfers assist  Chair/bed transfer activity did not occur: Safety/medical concerns  Chair/bed transfer assist level: Contact Guard/Touching assist     Locomotion Ambulation   Ambulation assist   Ambulation activity did not occur: Safety/medical concerns  Assist level: Supervision/Verbal cueing Assistive device: Walker-rolling Max distance: 14ft   Walk 10 feet activity   Assist  Walk 10 feet activity did not occur: Safety/medical concerns  Assist level: Supervision/Verbal cueing Assistive device: Walker-rolling   Walk 50 feet activity   Assist Walk 50 feet with 2 turns activity did not occur: Safety/medical concerns  Assist level: Supervision/Verbal cueing Assistive  device: Walker-rolling    Walk 150 feet activity   Assist Walk 150 feet activity did not occur: Safety/medical concerns  Assist level: Supervision/Verbal cueing Assistive device: Walker-rolling    Walk 10 feet on uneven surface  activity   Assist Walk 10 feet on uneven surfaces activity did not occur: Safety/medical concerns         Wheelchair     Assist Will patient use wheelchair at discharge?: Yes Type of Wheelchair: Manual Wheelchair activity did not occur: Safety/medical concerns  Wheelchair assist level: Supervision/Verbal cueing Max wheelchair distance: 134 ft    Wheelchair 50 feet with 2 turns activity    Assist    Wheelchair 50 feet with 2 turns activity did not occur: Safety/medical concerns   Assist Level: Supervision/Verbal cueing   Wheelchair 150 feet activity     Assist  Wheelchair 150 feet activity did not occur: Safety/medical concerns       Blood pressure 114/67, pulse 94, temperature 98.2 F (36.8 C), temperature source Oral, resp. rate 16, height 5\' 1"  (1.549 m), weight 73.2 kg, SpO2 99 %.    Medical Problem List and Plan: 1.   Debility secondary to left femoral intertrochanteric hip fracture.  Status post IM nailing 06/20/2021.  Weightbearing as tolerated             -patient may shower but incision must be covered             -ELOS/Goals: 2-3 weeks modI            Con't PT and OT- d/c date 7/20- is scared won't be ready- but mobilizing well.   -con't PT and OT- spent time talking with pt 2x about that if she's doesn't meet our goals (her goal is to not need w/c and to be Mod I)- goals of Supervision by d/c. Will still need help at d/c.   -con't PT and OT- will move d/c date to 7/22 to help family and let pt get a little better- husband has dementia and he will keep asking pt to do things and she's just not good enough at this time- only has intermittent help at home.  2.  Impaired mobility: continue SCDs.                -antiplatelet therapy: continue Aspirin 81 mg daily and Plavix 75 mg daily 3. Post-operative left hip pain: Continue Oxycodone and Robaxin as needed. History of negative side effects to gabapentin. Placed order to request that oxycodone be administered 30 minutes prior to therapy sessions  7/12- will have pt take 10 mg Oxy q4 hours prn- she had been trying to avoid taking unless had to- explained that she will do better in therapy if pain better controlled- will get off meds- I promise, but needs  for now.   7/14- Pt's pain controlled most of time- but still does have spikes in pain-   7/15- didn't take pain meds overnight, so pain up this AM- but overall doing better- con't regimen 4. Anxiety: Continue Effexor 75 mg daily, Xanax 1 mg- changed to scheduled. Discussed risk of respiratory depression with concurrent use of Xanax and oxycodone- she has been tolerating both together.              -antipsychotic agents: N/A 5. Neuropsych: This patient is capable of making decisions on her own behalf. 6. Skin/Wound Care: Routine skin checks 7. Fluids/Electrolytes/Nutrition: Routine in and outs with follow-up chemistries 8.  Acute blood loss anemia.  CBC stable. Continue to monitor weekly.   7/13- Hb stable at 8.5- con't to monitor weekly. 7/14- Hb 8.0- down slightly from 7/6, however says has been drinking more- can hemodilute.   9.  CAD with history of multiple stents.  Continue aspirin Plavix 10.  Hyperlipidemia.  Continue Crestor 11.  Nocturnal oxygen requirements.  Continue oxygen as prior to admission  7/14- O2 drops at night when doesn't wear O2- encouraged to wear at night.  12.  Hypertension.  Well controlled, decrease lopressor to 12.5mg  BID.  Monitor with increased mobility  7/12- BP well controlled- however having dizziness- see #14 for more.-con't regimen for now.   7/13- orthostatics looked good- didn't drop, except pulse got very slightly more- will con't to monitor  7/14- BP went up with  AM orthostatics and pulse very slightly more, but not significant- con't to monitor 13.  Constipation.  Continue laxative assistance as directed 14. Lightheaded  7/12- will order Orthostatics to be done- see if it's (+)- if so, will decrease Lopressor- also have pt drink more- she's only drinking 4 cups/water daily- will increase to at least 6 cups/day.   7/13- Cr looks OK last week- will recheck labs in AM  7/14- Cr stable at 0.57 and BUN 8 15. Dispo  7/15- will extend to 7/22.   LOS: 10 days A FACE TO FACE EVALUATION WAS PERFORMED  Khylen Riolo 07/04/2021, 8:51 AM

## 2021-07-04 NOTE — Progress Notes (Signed)
Occupational Therapy Session Note  Patient Details  Name: Carrie Chung MRN: [id number] Date of Birth: Mar 06, 1941  Today's Date: 07/04/2021 OT Individual Time: 1101-1202 OT Individual Time Calculation (min): 61 min   Skilled Therapeutic Interventions/Progress Updates:    Pt greeted in the recliner and premedicated for pain. Agreeable to shower with encouragement. She completed toileting, bathing, and dressing during session. All functional transfers completed with CGA-Min A using RW at ambulatory level. LH sponge used for washing feet in the shower. Mod A for donning underwear + pants while sitting in the recliner, pt would benefit from trialing a reacher. She would also benefit from trialing the sock aide. Pt reported pain was "5/10 trending upwards" before shower, and post shower "5/10 trending downwards." We used the hair-dryer to blow dry hair which appeared to brighten affect. At end of session pt remained sitting in the recliner, all needs within reach.   Therapy Documentation Precautions:  Precautions Precautions: Fall Restrictions Weight Bearing Restrictions: No LLE Weight Bearing: Weight bearing as tolerated   Pain Assessment Pain Scale: 0-10 Pain Score: 4  Pain Type: Surgical pain Pain Location: Hip Pain Orientation: Left Pain Descriptors / Indicators: Aching Pain Frequency: Intermittent Pain Onset: On-going Patients Stated Pain Goal: 2 Pain Intervention(s): Medication (See eMAR) Multiple Pain Sites: No ADL: ADL Eating: Not assessed Grooming: Supervision/safety Upper Body Bathing: Minimal assistance (simulated) Lower Body Bathing: Maximal assistance Upper Body Dressing: Minimal assistance Lower Body Dressing: Dependent Toileting: Dependent Toilet Transfer: Not assessed (NA d/t pain)      Therapy/Group: Individual Therapy  Carrie Chung 07/04/2021, 2:42 PM

## 2021-07-04 NOTE — Progress Notes (Signed)
Physical Therapy Session Note  Patient Details  Name: Carrie Chung MRN: [id number] Date of Birth: 05/19/1941  Today's Date: 07/04/2021 PT Individual Time: 0935-1000 PT Individual Time Calculation (min): 25 min   Short Term Goals: Week 2:  PT Short Term Goal 1 (Week 2): =LTGs due to ELOS  Skilled Therapeutic Interventions/Progress Updates: Pt presented in w/c agreeable to therapy. Pt states pain 4/10 but premedicated. Discussed with pt what may be current barriers to mobility once d/c, with pt concerned re: carrying meals/plates to table and managing a step to access other part of house. Provided info in use of walker tray with PTA providing example from computer. Pt then propelled to rehab gym supervision and PTA demonstrated step up/down using RW. Pt agreeable to try and found that it was beneficial to place LLE on step and hop up with RLE. Pt was able to perform step down with RW and in more traditional manner leading with LLE. Pt was able to ascend/descend 1 5in step with CGA overall and increased effort. Pt verbalized understanding that she will continue to practice this until d/c and will feel more comfortable with curb negotiation prior to d/c. Pt propelled back to room in same manner as prior and performed ambulatory transfer to recliner with RW and CGA. Pt left in recliner at end of session with seat alarm on, call bell within reach and needs met.       Therapy Documentation Precautions:  Precautions Precautions: Fall Restrictions Weight Bearing Restrictions: No LLE Weight Bearing: Weight bearing as tolerated General:   Vital Signs:   Pain: Pain Assessment Pain Score: 5  Mobility:   Locomotion :    Trunk/Postural Assessment :    Balance:   Exercises:   Other Treatments:      Therapy/Group: Individual Therapy    07/04/2021, 12:13 PM  

## 2021-07-04 NOTE — Progress Notes (Signed)
Patient ID: Carrie Chung, female   DOB: Jan 19, 1941, 80 y.o.   MRN: [id number] Met with team and spoke with MD regarding extending pt until 7/22-Friday. All were on board with this and feels the extra time will give pt and family time for ramps to be delivered and pt to continue her progress and feel more comfortable with discharge. Pt and daughter aware of this plan and are in agreement. Work on discharge needs.

## 2021-07-05 NOTE — Progress Notes (Signed)
Occupational Therapy Session Note  Patient Details  Name: Carrie Chung MRN: [id number] Date of Birth: [birth year]  Today's Date: 07/05/2021 OT Individual Time: 4103-0131 OT Individual Time Calculation (min): 58 min    Short Term Goals: Week 1:  OT Short Term Goal 1 (Week 1): Pt will perform BSC/toilet transfer with LRAD MIN A OT Short Term Goal 1 - Progress (Week 1): Met OT Short Term Goal 2 (Week 1): Pt will perform LB dressing sit <> stands level Mod A OT Short Term Goal 2 - Progress (Week 1): Met OT Short Term Goal 3 (Week 1): Pt will perform UB/LB dress with AE PRN with Mod A OT Short Term Goal 3 - Progress (Week 1): Met Week 2:  OT Short Term Goal 1 (Week 2): STGs = LTGs d/t ELOS  Skilled Therapeutic Interventions/Progress Updates:    Pt greeted at time of session semireclined in bed resting agreeable to OT session, no pain mentioned throughout session. Ace wrapped LLE per pt request saying MD asked her to relay to therapy this morning during rounds. Discussed extension of DC date as well and pt aware and happy about this. Bed mobility Supervision using momentum and leg lifter. Pt ambulated around room Supervision to gather clothes from dresser > walked back to bed and dressed EOB. Mod I for UB and Min A today for LB only d/t stretchy pants got stuck on gripper sock. Stand pivot to recliner Supervision. Alarm on call bell in reach.  Note attempted to see patient later in the day for additional unplanned session but politely declined as she had her mental health therapy scheduled in 30 minutes.    Therapy Documentation Precautions:  Precautions Precautions: Fall Restrictions Weight Bearing Restrictions: No LLE Weight Bearing: Weight bearing as tolerated    Therapy/Group: Individual Therapy  Viona Gilmore 07/05/2021, 7:12 AM

## 2021-07-05 NOTE — Progress Notes (Signed)
PROGRESS NOTE   Subjective/Complaints:  Pt didn't get ACE wrap yesterday. Feels like swelling is stable, but appears to moving down  LLE.  Has GERD so sleeps with HOB elevated.  Concerned about amount of swelling- scares her.  We discussed again how pt sitting with legs elevated and HOB elevated, makes a "V" and puts all swelling at that point- is having some in LLE due to gravity when walking.     ROS:   Pt denies SOB, abd pain, CP, N/V/C/D, and vision changes   Objective:   No results found. Recent Labs    07/03/21 0511  WBC 5.8  HGB 8.0*  HCT 25.2*  PLT 216    Recent Labs    07/03/21 0511  NA 138  K 4.2  CL 102  CO2 31  GLUCOSE 131*  BUN 8  CREATININE 0.57  CALCIUM 8.6*     Intake/Output Summary (Last 24 hours) at 07/05/2021 1314 Last data filed at 07/05/2021 0700 Gross per 24 hour  Intake 240 ml  Output --  Net 240 ml        Physical Exam: Vital Signs Blood pressure 104/60, pulse 79, temperature 98 F (36.7 C), resp. rate 16, height 5\' 1"  (1.549 m), weight 73.2 kg, SpO2 96 %.       General: awake, alert, appropriate, sitting up in bed- with "V" with HOB up and legs also up; most of weight on hips; NAD HENT: conjugate gaze; oropharynx moist CV: regular rate; no JVD Pulmonary: CTA B/L; no W/R/R- good air movement GI: soft, NT, ND, (+)BS Psychiatric: appropriate;  Neurological: alert; some decreased STM Skin: 2 L hip incisions C/D/I- but underneath have steristrips- a little dried blood- moderate bruising and edema, but no drainage on wound/banadage.  C/D/I- has moderate edema- and worse right at L hip due to positioning with L hip/buttock at base of "V"- looks the same today- slightly more spread out today down to toes on LLE- Feet/ankles 2+ on LLE; 1+ on RLE ankles- however more swelling noted around L knee- due to gravity. Appears extravascular.  Neurological:    Comments: Patient is  alert.  No acute distress.  Oriented x3 and follows commands. Strength intact except pain limited in LLE.   Assessment/Plan: 1. Functional deficits which require 3+ hours per day of interdisciplinary therapy in a comprehensive inpatient rehab setting. Physiatrist is providing close team supervision and 24 hour management of active medical problems listed below. Physiatrist and rehab team continue to assess barriers to discharge/monitor patient progress toward functional and medical goals  Care Tool:  Bathing    Body parts bathed by patient: Right arm, Left arm, Chest, Abdomen, Right upper leg, Left upper leg, Face, Front perineal area, Buttocks, Right lower leg, Left lower leg   Body parts bathed by helper: Buttocks, Right lower leg, Left lower leg, Front perineal area     Bathing assist Assist Level: Contact Guard/Touching assist     Upper Body Dressing/Undressing Upper body dressing   What is the patient wearing?: Pull over shirt, Bra    Upper body assist Assist Level: Set up assist    Lower Body Dressing/Undressing Lower body dressing  What is the patient wearing?: Pants, Underwear/pull up     Lower body assist Assist for lower body dressing: Moderate Assistance - Patient 50 - 74% (without AE)     Toileting Toileting Toileting Activity did not occur (Clothing management and hygiene only):  (Patient voided in brief and had stool)  Toileting assist Assist for toileting: Moderate Assistance - Patient 50 - 74%     Transfers Chair/bed transfer  Transfers assist  Chair/bed transfer activity did not occur: Safety/medical concerns  Chair/bed transfer assist level: Supervision/Verbal cueing     Locomotion Ambulation   Ambulation assist   Ambulation activity did not occur: Safety/medical concerns  Assist level: Supervision/Verbal cueing Assistive device: Walker-rolling Max distance: 160ft   Walk 10 feet activity   Assist  Walk 10 feet activity did not  occur: Safety/medical concerns  Assist level: Supervision/Verbal cueing Assistive device: Walker-rolling   Walk 50 feet activity   Assist Walk 50 feet with 2 turns activity did not occur: Safety/medical concerns  Assist level: Supervision/Verbal cueing Assistive device: Walker-rolling    Walk 150 feet activity   Assist Walk 150 feet activity did not occur: Safety/medical concerns  Assist level: Supervision/Verbal cueing Assistive device: Walker-rolling    Walk 10 feet on uneven surface  activity   Assist Walk 10 feet on uneven surfaces activity did not occur: Safety/medical concerns         Wheelchair     Assist Will patient use wheelchair at discharge?: Yes Type of Wheelchair: Manual Wheelchair activity did not occur: Safety/medical concerns  Wheelchair assist level: Supervision/Verbal cueing Max wheelchair distance: 134 ft    Wheelchair 50 feet with 2 turns activity    Assist    Wheelchair 50 feet with 2 turns activity did not occur: Safety/medical concerns   Assist Level: Supervision/Verbal cueing   Wheelchair 150 feet activity     Assist  Wheelchair 150 feet activity did not occur: Safety/medical concerns       Blood pressure 104/60, pulse 79, temperature 98 F (36.7 C), resp. rate 16, height 5\' 1"  (1.549 m), weight 73.2 kg, SpO2 96 %.    Medical Problem List and Plan: 1.   Debility secondary to left femoral intertrochanteric hip fracture.  Status post IM nailing 06/20/2021.  Weightbearing as tolerated             -patient may shower but incision must be covered             -ELOS/Goals: 2-3 weeks modI            Con't PT and OT- d/c date 7/20- is scared won't be ready- but mobilizing well.   -con't PT and OT- spent time talking with pt 2x about that if she's doesn't meet our goals (her goal is to not need w/c and to be Mod I)- goals of Supervision by d/c. Will still need help at d/c.   -con't PT and OT- will move d/c date to 7/22 to help  family and let pt get a little better- husband has dementia and he will keep asking pt to do things and she's just not good enough at this time- only has intermittent help at home.   -con't PT and OT/CIR 2.  Impaired mobility: continue SCDs.               -antiplatelet therapy: continue Aspirin 81 mg daily and Plavix 75 mg daily 3. Post-operative left hip pain: Continue Oxycodone and Robaxin as needed. History of negative side effects to  gabapentin. Placed order to request that oxycodone be administered 30 minutes prior to therapy sessions  7/12- will have pt take 10 mg Oxy q4 hours prn- she had been trying to avoid taking unless had to- explained that she will do better in therapy if pain better controlled- will get off meds- I promise, but needs for now.   7/16- pain getting slightly better- con't regimen 4. Anxiety: Continue Effexor 75 mg daily, Xanax 1 mg- changed to scheduled. Discussed risk of respiratory depression with concurrent use of Xanax and oxycodone- she has been tolerating both together.              -antipsychotic agents: N/A 5. Neuropsych: This patient is capable of making decisions on her own behalf. 6. Skin/Wound Care: Routine skin checks 7. Fluids/Electrolytes/Nutrition: Routine in and outs with follow-up chemistries 8.  Acute blood loss anemia.  CBC stable. Continue to monitor weekly.   7/13- Hb stable at 8.5- con't to monitor weekly. 7/14- Hb 8.0- down slightly from 7/6, however says has been drinking more- can hemodilute.   9.  CAD with history of multiple stents.  Continue aspirin Plavix 10.  Hyperlipidemia.  Continue Crestor 11.  Nocturnal oxygen requirements.  Continue oxygen as prior to admission  7/14- O2 drops at night when doesn't wear O2- encouraged to wear at night.  12.  Hypertension.  Well controlled, decrease lopressor to 12.5mg  BID.  Monitor with increased mobility  7/12- BP well controlled- however having dizziness- see #14 for more.-con't regimen for now.    7/13- orthostatics looked good- didn't drop, except pulse got very slightly more- will con't to monitor  7/14- BP went up with AM orthostatics and pulse very slightly more, but not significant- con't to monitor  7/15- no dizziness in last 24 hours- con't to monitor 13.  Constipation.  Continue laxative assistance as directed 14. Lightheaded  7/12- will order Orthostatics to be done- see if it's (+)- if so, will decrease Lopressor- also have pt drink more- she's only drinking 4 cups/water daily- will increase to at least 6 cups/day.   7/13- Cr looks OK last week- will recheck labs in AM  7/14- Cr stable at 0.57 and BUN 8 15. LLE edema  7/16- went over how it's due to the "V" position and gravity- has the same amount of edema- it's just spread out and L hip isn't as hard/edematous.  16. Dispo  7/15- will extend to 7/22.   LOS: 11 days A FACE TO FACE EVALUATION WAS PERFORMED  Adonis Yim 07/05/2021, 1:14 PM

## 2021-07-06 NOTE — Progress Notes (Signed)
PROGRESS NOTE   Subjective/Complaints:  Pt got ACE wrapped for short period yesterday- took off when got back in bed- asked her to wear from now til dinner time.  Showed nursing location to wrap- from toes to mid thigh.    ROS:   Pt denies SOB, abd pain, CP, N/V/C/D, and vision changes   Objective:   No results found. No results for input(s): WBC, HGB, HCT, PLT in the last 72 hours.   No results for input(s): NA, K, CL, CO2, GLUCOSE, BUN, CREATININE, CALCIUM in the last 72 hours.    Intake/Output Summary (Last 24 hours) at 07/06/2021 1021 Last data filed at 07/06/2021 0900 Gross per 24 hour  Intake 720 ml  Output --  Net 720 ml        Physical Exam: Vital Signs Blood pressure 130/72, pulse 93, temperature 97.6 F (36.4 C), temperature source Oral, resp. rate 17, height 5\' 1"  (1.549 m), weight 73.2 kg, SpO2 100 %.        General: awake, alert, appropriate, sitting up in bed;  NAD HENT: conjugate gaze; oropharynx moist CV: regular rate; no JVD Pulmonary: CTA B/L; no W/R/R- good air movement GI: soft, NT, ND, (+)BS Psychiatric: appropriate- focused on swelling- slightly perseverative  Neurological: alert; some decreased STM Skin: 2 L hip incisions C/D/I- but underneath have steristrips- a little dried blood- moderate bruising and edema, but no drainage on wound/bandage.  C/D/I- has moderate edema- and worse right at L hip due to positioning with L hip/buttock at base of "V"- looks the same today-very slightly less to stable swelling- 2+ LLE ankle and 1+ on R ankle- also swelling around L knee/medially - swelling spreading out due ot gravity Neurological:    Comments: Patient is alert.  No acute distress.  Oriented x3 and follows commands. Strength intact except pain limited in LLE.   Assessment/Plan: 1. Functional deficits which require 3+ hours per day of interdisciplinary therapy in a comprehensive  inpatient rehab setting. Physiatrist is providing close team supervision and 24 hour management of active medical problems listed below. Physiatrist and rehab team continue to assess barriers to discharge/monitor patient progress toward functional and medical goals  Care Tool:  Bathing    Body parts bathed by patient: Right arm, Left arm, Chest, Abdomen, Right upper leg, Left upper leg, Face, Front perineal area, Buttocks, Right lower leg, Left lower leg   Body parts bathed by helper: Buttocks, Right lower leg, Left lower leg, Front perineal area     Bathing assist Assist Level: Contact Guard/Touching assist     Upper Body Dressing/Undressing Upper body dressing   What is the patient wearing?: Pull over shirt, Bra    Upper body assist Assist Level: Set up assist    Lower Body Dressing/Undressing Lower body dressing      What is the patient wearing?: Pants, Underwear/pull up     Lower body assist Assist for lower body dressing: Moderate Assistance - Patient 50 - 74% (without AE)     Toileting Toileting Toileting Activity did not occur (Clothing management and hygiene only):  (Patient voided in brief and had stool)  Toileting assist Assist for toileting: Moderate Assistance -  Patient 50 - 74%     Transfers Chair/bed transfer  Transfers assist  Chair/bed transfer activity did not occur: Safety/medical concerns  Chair/bed transfer assist level: Supervision/Verbal cueing     Locomotion Ambulation   Ambulation assist   Ambulation activity did not occur: Safety/medical concerns  Assist level: Supervision/Verbal cueing Assistive device: Walker-rolling Max distance: 116ft   Walk 10 feet activity   Assist  Walk 10 feet activity did not occur: Safety/medical concerns  Assist level: Supervision/Verbal cueing Assistive device: Walker-rolling   Walk 50 feet activity   Assist Walk 50 feet with 2 turns activity did not occur: Safety/medical concerns  Assist  level: Supervision/Verbal cueing Assistive device: Walker-rolling    Walk 150 feet activity   Assist Walk 150 feet activity did not occur: Safety/medical concerns  Assist level: Supervision/Verbal cueing Assistive device: Walker-rolling    Walk 10 feet on uneven surface  activity   Assist Walk 10 feet on uneven surfaces activity did not occur: Safety/medical concerns         Wheelchair     Assist Will patient use wheelchair at discharge?: Yes Type of Wheelchair: Manual Wheelchair activity did not occur: Safety/medical concerns  Wheelchair assist level: Supervision/Verbal cueing Max wheelchair distance: 134 ft    Wheelchair 50 feet with 2 turns activity    Assist    Wheelchair 50 feet with 2 turns activity did not occur: Safety/medical concerns   Assist Level: Supervision/Verbal cueing   Wheelchair 150 feet activity     Assist  Wheelchair 150 feet activity did not occur: Safety/medical concerns       Blood pressure 130/72, pulse 93, temperature 97.6 F (36.4 C), temperature source Oral, resp. rate 17, height 5\' 1"  (1.549 m), weight 73.2 kg, SpO2 100 %.    Medical Problem List and Plan: 1.   Debility secondary to left femoral intertrochanteric hip fracture.  Status post IM nailing 06/20/2021.  Weightbearing as tolerated             -patient may shower but incision must be covered             -ELOS/Goals: 2-3 weeks modI            Con't PT and OT- d/c date 7/20- is scared won't be ready- but mobilizing well.   -con't PT and OT- spent time talking with pt 2x about that if she's doesn't meet our goals (her goal is to not need w/c and to be Mod I)- goals of Supervision by d/c. Will still need help at d/c.   -con't PT and OT- will move d/c date to 7/22 to help family and let pt get a little better- husband has dementia and he will keep asking pt to do things and she's just not good enough at this time- only has intermittent help at home.   -con't PT and  OT/CIR 2.  Impaired mobility: continue SCDs.               -antiplatelet therapy: continue Aspirin 81 mg daily and Plavix 75 mg daily 3. Post-operative left hip pain: Continue Oxycodone and Robaxin as needed. History of negative side effects to gabapentin. Placed order to request that oxycodone be administered 30 minutes prior to therapy sessions  7/12- will have pt take 10 mg Oxy q4 hours prn- she had been trying to avoid taking unless had to- explained that she will do better in therapy if pain better controlled- will get off meds- I promise, but needs for now.  7/17- pain stable- con't regimen 4. Anxiety: Continue Effexor 75 mg daily, Xanax 1 mg- changed to scheduled. Discussed risk of respiratory depression with concurrent use of Xanax and oxycodone- she has been tolerating both together.              -antipsychotic agents: N/A 5. Neuropsych: This patient is capable of making decisions on her own behalf. 6. Skin/Wound Care: Routine skin checks 7. Fluids/Electrolytes/Nutrition: Routine in and outs with follow-up chemistries 8.  Acute blood loss anemia.  CBC stable. Continue to monitor weekly.   7/13- Hb stable at 8.5- con't to monitor weekly. 7/14- Hb 8.0- down slightly from 7/6, however says has been drinking more- can hemodilute. 7/17- labs in AM   9.  CAD with history of multiple stents.  Continue aspirin Plavix 10.  Hyperlipidemia.  Continue Crestor 11.  Nocturnal oxygen requirements.  Continue oxygen as prior to admission  7/14- O2 drops at night when doesn't wear O2- encouraged to wear at night.  12.  Hypertension.  Well controlled, decrease lopressor to 12.5mg  BID.  Monitor with increased mobility  7/12- BP well controlled- however having dizziness- see #14 for more.-con't regimen for now.   7/13- orthostatics looked good- didn't drop, except pulse got very slightly more- will con't to monitor  7/14- BP went up with AM orthostatics and pulse very slightly more, but not significant-  con't to monitor  7/15- no dizziness in last 24 hours- con't to monitor 13.  Constipation.  Continue laxative assistance as directed 14. Lightheaded  7/12- will order Orthostatics to be done- see if it's (+)- if so, will decrease Lopressor- also have pt drink more- she's only drinking 4 cups/water daily- will increase to at least 6 cups/day.   7/13- Cr looks OK last week- will recheck labs in AM  7/14- Cr stable at 0.57 and BUN 8  7/17- will recheck Monday 15. LLE edema  7/16- went over how it's due to the "V" position and gravity- has the same amount of edema- it's just spread out and L hip isn't as hard/edematous.   717 - encouraged pt to wear ACE wrap of LLE from ties to mid thigh until dinner time.  16. Dispo  7/15- will extend to 7/22.   LOS: 12 days A FACE TO FACE EVALUATION WAS PERFORMED  Carrie Chung 07/06/2021, 10:21 AM

## 2021-07-06 NOTE — Progress Notes (Signed)
Occupational Therapy Session Note  Patient Details  Name: MITSUKO LUERA MRN: [id number] Date of Birth: 12/06/1941  Today's Date: 07/06/2021 OT Group Time: 1105-1200 OT Group Time Calculation (min): 55 min   Skilled Therapeutic Interventions/Progress Updates:    Pt engaged in therapeutic w/c level dance group focusing on patient choice, UE/LE strengthening, salience, activity tolerance, and social participation. Pt was guided through various dance-based exercises involving UEs/LEs and trunk. All music was selected by group members. Emphasis placed on general strengthening/endurance and edema mgt through ROM. Pt participated well while seated, suggested music, and interacted socially with others. Her Lt LE was ACE wrapped for edema control. She declined standing when provided with the opportunity. At end of session, pt was escorted back to the room by RT.    Therapy Documentation Precautions:  Precautions Precautions: Fall Restrictions Weight Bearing Restrictions: No LLE Weight Bearing: Weight bearing as tolerated  Pain: no s/s pain during tx Pain Assessment Pain Scale: 0-10 Pain Score: 0-No pain Pain Type: Acute pain Pain Location: Hip Pain Orientation: Left Pain Descriptors / Indicators: Aching Pain Frequency: Intermittent Pain Onset: On-going Pain Intervention(s): Medication (See eMAR) ADL: ADL Eating: Not assessed Grooming: Supervision/safety Upper Body Bathing: Minimal assistance (simulated) Lower Body Bathing: Maximal assistance Upper Body Dressing: Minimal assistance Lower Body Dressing: Dependent Toileting: Dependent Toilet Transfer: Not assessed (NA d/t pain)     Therapy/Group: Group Therapy  Tarshia Kot A Reyana Leisey 07/06/2021, 12:51 PM

## 2021-07-07 LAB — BASIC METABOLIC PANEL
Anion gap: 5 (ref 5–15)
BUN: 5 mg/dL — ABNORMAL LOW (ref 8–23)
CO2: 33 mmol/L — ABNORMAL HIGH (ref 22–32)
Calcium: 8.5 mg/dL — ABNORMAL LOW (ref 8.9–10.3)
Chloride: 103 mmol/L (ref 98–111)
Creatinine, Ser: 0.56 mg/dL (ref 0.44–1.00)
GFR, Estimated: >60 mL/min (ref >60)
Glucose, Bld: 148 mg/dL — ABNORMAL HIGH (ref 70–99)
Potassium: 3.3 mmol/L — ABNORMAL LOW (ref 3.5–5.1)
Sodium: 141 mmol/L (ref 135–145)

## 2021-07-07 LAB — CBC WITH DIFFERENTIAL/PLATELET
Abs Immature Granulocytes: 0.01 10*3/uL (ref 0.00–0.07)
Basophils Absolute: 0 10*3/uL (ref 0.0–0.1)
Basophils Relative: 1 %
Eosinophils Absolute: 0.1 10*3/uL (ref 0.0–0.5)
Eosinophils Relative: 4 %
HCT: 26.9 % — ABNORMAL LOW (ref 36.0–46.0)
Hemoglobin: 8.4 g/dL — ABNORMAL LOW (ref 12.0–15.0)
Immature Granulocytes: 0 %
Lymphocytes Relative: 26 %
Lymphs Abs: 1 10*3/uL (ref 0.7–4.0)
MCH: 31.3 pg (ref 26.0–34.0)
MCHC: 31.2 g/dL (ref 30.0–36.0)
MCV: 100.4 fL — ABNORMAL HIGH (ref 80.0–100.0)
Monocytes Absolute: 0.3 10*3/uL (ref 0.1–1.0)
Monocytes Relative: 9 %
Neutro Abs: 2.3 10*3/uL (ref 1.7–7.7)
Neutrophils Relative %: 60 %
Platelets: 208 10*3/uL (ref 150–400)
RBC: 2.68 MIL/uL — ABNORMAL LOW (ref 3.87–5.11)
RDW: 13.8 % (ref 11.5–15.5)
WBC: 3.7 10*3/uL — ABNORMAL LOW (ref 4.0–10.5)
nRBC: 0 % (ref 0.0–0.2)

## 2021-07-07 MED ORDER — SIMETHICONE 80 MG PO CHEW
80.0000 mg | CHEWABLE_TABLET | Freq: Four times a day (QID) | ORAL | Status: DC | PRN
  Administered 2021-07-08: 80 mg via ORAL
  Filled 2021-07-07: qty 1

## 2021-07-07 MED ORDER — OXYCODONE HCL 5 MG PO TABS
5.0000 mg | ORAL_TABLET | ORAL | Status: DC | PRN
  Administered 2021-07-07 (×2): 5 mg via ORAL
  Filled 2021-07-07 (×3): qty 1

## 2021-07-07 NOTE — Progress Notes (Signed)
Occupational Therapy Session Note  Patient Details  Name: Carrie Chung MRN: [id number] Date of Birth: 03-20-1941  Today's Date: 07/07/2021 OT Individual Time: 1829-9371 OT Individual Time Calculation (min): 57 min   Skilled Therapeutic Interventions/Progress Updates:    Pt greeted in the recliner, premedicated for pain. ADL needs met. She reported that she has low furniture at home that is not very firm. She ambulated with RW and supervision assist to the ADL apartment. Practiced sit<stands from low couch. Advised transferring close to an armrest for increased support during power up. While seated, sock aide education provided to increase her functional independence with donning footwear. Pt planning to purchase a sock aide for home use, able to doff socks using the reacher (pt can use her long shoe horn at home for this), and then don socks with supervision using aide. Also practiced ambulatory TTB transfers in the tub shower room using RW. Pt needed Min A to manage Lt LE over tub ledge. Advised purchasing nonslip treads for the shower floor to decrease fall risk. She then ambulated back to the room using RW at supervision level. Pt transferred to the recliner, left with all needs within reach and ice pack for the Lt hip. Tx focus placed on adaptive self care skills, d/c planning, and pt education.  Therapy Documentation Precautions:  Precautions Precautions: Fall Restrictions Weight Bearing Restrictions: No LLE Weight Bearing: Weight bearing as tolerated Pain Assessment Pain Score: 5  ADL: ADL Eating: Not assessed Grooming: Supervision/safety Upper Body Bathing: Minimal assistance (simulated) Lower Body Bathing: Maximal assistance Upper Body Dressing: Minimal assistance Lower Body Dressing: Dependent Toileting: Dependent Toilet Transfer: Not assessed (NA d/t pain)     Therapy/Group: Individual Therapy  Zeph Riebel A Brier Firebaugh 07/07/2021, 12:37 PM

## 2021-07-07 NOTE — Progress Notes (Signed)
Physical Therapy Session Note  Patient Details  Name: Carrie Chung MRN: [id number] Date of Birth: 07-28-1941  Today's Date: 07/07/2021 PT Individual Time: 0900-1005, 1300-1335 PT Individual Time Calculation (min): 65 min, 35 min   Short Term Goals: Week 2:  PT Short Term Goal 1 (Week 2): =LTGs due to ELOS  Skilled Therapeutic Interventions/Progress Updates:    Session 1: pt received in bed and agreeable to therapy. Reports 4/10 pain during session, premedicated. Supine>sit with supervision and leg lifter. Sit to stand with supervision and RW throughout session. ambulatory transfer with RW and supervision to bathroom, independent with all toileting tasks. Pt stood at sink for hand hygiene and oral care with supervision. Pt then ambulated in room distances with RW with close supervision fading to distant supervision. Pt dressed while seated EOB mod I, therapist donned ace wrap to mid thigh. Pt propelled w/c with BUE x 150 ft to gym. Pt performed Sit to stand x 5 for LE strength and endurance. Pt then performed lunges on bosu in // bars x 10 BIL with supervision for increased strength, ROM, and proprioception. Pt returned to recliner after session and was left with all needs in reach and alarm active.  Session 2: Pt received in recliner and agreeable to therapy.  Pt without complaint of pain during session. Session focused on gait training and endurance. Gait x 250 ft with RW and supervision, 2 seated rest breaks. Pt demoed slight hunched posture, step to pattern, and mild antalgic gait. Discussed pt's anxiety about going home and communicating with her husband. Pt expressed some relief with solutions offered. Pt propelled w/c back to room and performed ambulatory transfer to recliner with RW and supervision. Pt left with all needs in reach and chair alarm active.  Therapy Documentation Precautions:  Precautions Precautions: Fall Restrictions Weight Bearing Restrictions: No LLE Weight  Bearing: Weight bearing as tolerated     Therapy/Group: Individual Therapy  Mickel Fuchs 07/07/2021, 12:37 PM

## 2021-07-07 NOTE — Discharge Summary (Signed)
Physician Discharge Summary  Patient ID: Carrie Chung MRN: [id number] DOB/AGE: [id number] 81 y.o.  Admit date: 06/24/2021 Discharge date: 07/11/2021  Discharge Diagnoses:  Principal Problem:   Intertrochanteric fracture of left hip (HCC) DVT prophylaxis Pain management Mood stabilization Acute blood loss anemia CAD with history of multiple stents Hyperlipidemia Nocturnal oxygen requirements Hypertension Constipation  Discharged Condition: Stable  Significant Diagnostic Studies: DG Chest 1 View  Result Date: 06/19/2021 CLINICAL DATA:  Fall EXAM: CHEST  1 VIEW COMPARISON:  11/21/2020 FINDINGS: The heart size and mediastinal contours are within normal limits. Both lungs are clear. The visualized skeletal structures are unremarkable. IMPRESSION: No active disease. Electronically Signed   By: Fidela Salisbury MD   On: 06/19/2021 22:48   DG Wrist Complete Left  Result Date: 06/19/2021 CLINICAL DATA:  Fall, left wrist pain EXAM: LEFT WRIST - COMPLETE 3+ VIEW COMPARISON:  None. FINDINGS: Three view radiograph left wrist demonstrates normal alignment. No fracture or dislocation. Joint spaces are preserved. There is moderate soft tissue swelling dorsal to the a distal radius and ulna. IMPRESSION: Soft tissue swelling.  No fracture or dislocation. Electronically Signed   By: Fidela Salisbury MD   On: 06/19/2021 22:52   DG Abd 1 View  Result Date: 07/09/2021 CLINICAL DATA:  New onset nausea and vomiting for 24 hours. EXAM: ABDOMEN - 1 VIEW COMPARISON:  None. FINDINGS: Portable supine view of the abdomen obtained. Air within nondilated stomach. Air within nondilated small and large bowel. No evidence of obstruction. Minimal formed stool in the colon. No visualized radiopaque calculi or abnormal soft tissue calcifications. Lumbar spine degenerative change. IMPRESSION: Nonobstructive bowel gas pattern. Electronically Signed   By: Keith Rake M.D.   On: 07/09/2021 15:09   CT HEAD WO  CONTRAST  Result Date: 06/19/2021 CLINICAL DATA:  Fall, head injury EXAM: CT HEAD WITHOUT CONTRAST TECHNIQUE: Contiguous axial images were obtained from the base of the skull through the vertex without intravenous contrast. COMPARISON:  MRI 01/14/2018 FINDINGS: Brain: Normal anatomic configuration. Parenchymal volume loss is commensurate with the patient's age. Mild periventricular white matter changes are present likely reflecting the sequela of small vessel ischemia. No abnormal intra or extra-axial mass lesion or fluid collection. No abnormal mass effect or midline shift. No evidence of acute intracranial hemorrhage or infarct. Ventricular size is normal. Cerebellum unremarkable. Vascular: No asymmetric hyperdense vasculature at the skull base. Skull: Intact Sinuses/Orbits: Paranasal sinuses are clear. Orbits are unremarkable. Other: Mastoid air cells and middle ear cavities are clear. IMPRESSION: No acute intracranial hemorrhage or infarct.  Mild senescent change. Electronically Signed   By: Fidela Salisbury MD   On: 06/19/2021 22:56   CT Hip Left Wo Contrast  Result Date: 06/19/2021 CLINICAL DATA:  Fall EXAM: CT OF THE LEFT HIP WITHOUT CONTRAST TECHNIQUE: Multidetector CT imaging of the left hip was performed according to the standard protocol. Multiplanar CT image reconstructions were also generated. COMPARISON:  Plain films earlier today FINDINGS: Bones/Joint/Cartilage Left femoral intertrochanteric fracture noted. The fracture is mildly comminuted and impacted. No subluxation or dislocation. Fracture is acute. Ligaments Suboptimally assessed by CT. Muscles and Tendons Unremarkable Soft tissues Unremarkable. IMPRESSION: Comminuted, impacted acute left femoral intertrochanteric fracture. Electronically Signed   By: Rolm Baptise M.D.   On: 06/19/2021 23:55   DG CHEST PORT 1 VIEW  Result Date: 07/10/2021 CLINICAL DATA:  Fever, chills. EXAM: PORTABLE CHEST 1 VIEW COMPARISON:  June 19, 2021. FINDINGS: The  heart size and mediastinal contours are within normal limits. Both lungs  are clear. The visualized skeletal structures are unremarkable. IMPRESSION: No active disease. Electronically Signed   By: Marijo Conception M.D.   On: 07/10/2021 11:24   DG C-Arm 1-60 Min  Result Date: 06/20/2021 CLINICAL DATA:  Intramedullary nail. EXAM: LEFT FEMUR 2 VIEWS; DG C-ARM 1-60 MIN COMPARISON:  Hip radiograph and CT yesterday FINDINGS: Five fluoroscopic spot views of the left femur obtained in the operating room. Intramedullary nail with trans trochanteric screw fixation of intertrochanteric femur fracture fluoroscopy time 38 seconds. Dose 7.67 mGy. IMPRESSION: Fluoroscopic spot views during ORIF left femur fracture. Electronically Signed   By: Keith Rake M.D.   On: 06/20/2021 15:24   DG Hip Unilat With Pelvis 2-3 Views Left  Result Date: 06/19/2021 CLINICAL DATA:  Fall, left hip pain EXAM: DG HIP (WITH OR WITHOUT PELVIS) 2-3V LEFT COMPARISON:  10/04/2017 FINDINGS: There is an age-indeterminate impacted basicervical left hip fracture. There is suggestion of trabecula bridging the fracture plane on lateral examination suggesting that this may be healed, however, this is not optimally assessed on this examination. There is no dislocation. No other fracture identified. Mild left hip degenerative arthritis is noted. IMPRESSION: Age indeterminate basicervical fracture of the left hip. Dedicated CT imaging is recommended for further evaluation. Electronically Signed   By: Fidela Salisbury MD   On: 06/19/2021 22:51   DG FEMUR MIN 2 VIEWS LEFT  Result Date: 06/20/2021 CLINICAL DATA:  Intramedullary nail. EXAM: LEFT FEMUR 2 VIEWS; DG C-ARM 1-60 MIN COMPARISON:  Hip radiograph and CT yesterday FINDINGS: Five fluoroscopic spot views of the left femur obtained in the operating room. Intramedullary nail with trans trochanteric screw fixation of intertrochanteric femur fracture fluoroscopy time 38 seconds. Dose 7.67 mGy. IMPRESSION:  Fluoroscopic spot views during ORIF left femur fracture. Electronically Signed   By: Keith Rake M.D.   On: 06/20/2021 15:24   VAS [place] LOWER EXTREMITY VENOUS (DVT)  Result Date: 06/26/2021  Lower Venous DVT Study Patient Name:  Carrie Chung  Date of Exam:   06/26/2021 Medical Rec #: [id number]           Accession #:    [id number] Date of Birth: 01/02/1941           Patient Gender: F Patient Age:   79Y Exam Location:  [facility] Procedure:      VAS [place] LOWER EXTREMITY VENOUS (DVT) Referring Phys: [place] --------------------------------------------------------------------------------  Indications: Swelling. Other Indications: Left femoral intertrochanteric hip fracture. Comparison Study: No recent - 5/15 Performing Technologist: Oda Cogan RDMS, RVT  Examination Guidelines: A complete evaluation includes B-mode imaging, spectral Doppler, color Doppler, and power Doppler as needed of all accessible portions of each vessel. Bilateral testing is considered an integral part of a complete examination. Limited examinations for reoccurring indications may be performed as noted. The reflux portion of the exam is performed with the patient in reverse Trendelenburg.  +---------+---------------+---------+-----------+----------+--------------+ RIGHT    CompressibilityPhasicitySpontaneityPropertiesThrombus Aging +---------+---------------+---------+-----------+----------+--------------+ CFV      Full           Yes      Yes                                 +---------+---------------+---------+-----------+----------+--------------+ SFJ      Full                                                        +---------+---------------+---------+-----------+----------+--------------+  FV Prox  Full                                                        +---------+---------------+---------+-----------+----------+--------------+ FV Mid   Full                                                         +---------+---------------+---------+-----------+----------+--------------+ FV DistalFull                                                        +---------+---------------+---------+-----------+----------+--------------+ PFV      Full                                                        +---------+---------------+---------+-----------+----------+--------------+ POP      Full           Yes      Yes                                 +---------+---------------+---------+-----------+----------+--------------+ PTV      Full                                                        +---------+---------------+---------+-----------+----------+--------------+ PERO     Full                                                        +---------+---------------+---------+-----------+----------+--------------+   +---------+---------------+---------+-----------+----------+--------------+ LEFT     CompressibilityPhasicitySpontaneityPropertiesThrombus Aging +---------+---------------+---------+-----------+----------+--------------+ CFV      Full           Yes      Yes                                 +---------+---------------+---------+-----------+----------+--------------+ SFJ      Full                                                        +---------+---------------+---------+-----------+----------+--------------+ FV Prox  Full                                                        +---------+---------------+---------+-----------+----------+--------------+  FV Mid   Full                                                        +---------+---------------+---------+-----------+----------+--------------+ FV DistalFull                                                        +---------+---------------+---------+-----------+----------+--------------+ PFV      Full                                                         +---------+---------------+---------+-----------+----------+--------------+ POP      Full           Yes      Yes                                 +---------+---------------+---------+-----------+----------+--------------+ PTV      Full                                                        +---------+---------------+---------+-----------+----------+--------------+ PERO     Full                                                        +---------+---------------+---------+-----------+----------+--------------+     Summary: BILATERAL: - No evidence of deep vein thrombosis seen in the lower extremities, bilaterally. -No evidence of popliteal cyst, bilaterally.   *See table(s) above for measurements and observations. Electronically signed by Monica Martinez MD on 06/26/2021 at 5:54:39 PM.    Final     Labs:  Basic Metabolic Panel: Recent Labs  Lab 07/07/21 0806 07/10/21 0626  NA 141 142  K 3.3* 3.5  CL 103 103  CO2 33* 29  GLUCOSE 148* 132*  BUN 5* <5*  CREATININE 0.56 0.54  CALCIUM 8.5* 9.0    CBC: Recent Labs  Lab 07/07/21 0806 07/10/21 0626  WBC 3.7* 5.3  NEUTROABS 2.3 3.8  HGB 8.4* 9.3*  HCT 26.9* 29.5*  MCV 100.4* 96.7  PLT 208 246    CBG: No results for input(s): GLUCAP in the last 168 hours.  Family history.  Mother with CAD.  Father with colon cancer.  Denies any esophageal cancer diabetes mellitus or hypertension  Brief HPI:   Carrie Chung is a 80 y.o. right-handed female with history of obstructive CAD status post stenting maintained on aspirin and Plavix, atrial tachycardia hypertension depression with anxiety nocturnal oxygen requirement and quit smoking 41 years ago.  Per chart review lives with spouse independent prior to admission.  Multilevel home bed and bath upstairs 3 steps to entry.  She  does have a daughter in the area.  Presented 06/19/2021 after mechanical fall landing on her left hip without loss of consciousness.  Cranial CT scan  negative.  X-rays and imaging revealed acute left femoral intertrochanteric hip fracture.  Status post IM nailing 06/20/2021 per Dr. Percell Miller.  Weightbearing as tolerated.  Patient's chronic aspirin and Plavix resumed postoperatively.  Acute blood loss anemia 8.8 and monitored.  Therapy evaluations completed due to patient decreased functional mobility was admitted for a comprehensive rehab program.   Hospital Course: Carrie Chung was admitted to rehab 06/24/2021 for inpatient therapies to consist of PT, ST and OT at least three hours five days a week. Past admission physiatrist, therapy team and rehab RN have worked together to provide customized collaborative inpatient rehab.  Pertaining to patient's left femoral intertrochanteric hip fracture status post IM nailing.  Weightbearing as tolerated neurovascular sensation intact patient would follow-up with orthopedic services.  Postoperative pain oxycodone Robaxin as advised.  DVT prophylaxis patient's chronic aspirin Plavix resumed postoperatively.  No bleeding episodes.  Mood stabilization with the use of Effexor emotional support provided.  Acute blood loss anemia stable latest hemoglobin 8.0.  Patient did have a history of CAD with multiple stents continue aspirin and Plavix no chest pain or increasing shortness of breath.  She did have a history of nocturnal oxygen requirements continued on oxygen as prior to admission.  Crestor ongoing for hyperlipidemia.  Bouts of constipation resolved with laxative assistance.  She did experience some bouts of lightheadedness monitoring for orthostasis her Lopressor was adjusted.   Blood pressures were monitored on TID basis and soft and monitored     Rehab course: During patient's stay in rehab weekly team conferences were held to monitor patient's progress, set goals and discuss barriers to discharge. At admission, patient required moderate assist sit to stand moderate assist stand pivot transfers 2-3 pivotal  steps with impulsivity  Physical exam.  Blood pressure 128/70 pulse 80 temperature 98 respirations 18 oxygen saturations 92% room air Constitutional.  No acute distress HEENT Head.  Normocephalic and atraumatic Eyes.  Pupils round and reactive to light no discharge without nystagmus Neck.  Supple nontender no JVD without thyromegaly Cardiac regular rate rhythm not extra sounds or murmur heard Abdomen.  Soft nontender positive bowel sounds without rebound Respiratory effort normal no respiratory distress without wheeze Skin.  Hip incision dressed appropriately tender Neurologic alert oriented x3 follows commands strength intact except pain left lower extremity  He/She  has had improvement in activity tolerance, balance, postural control as well as ability to compensate for deficits. He/She has had improvement in functional use RUE/LUE  and RLE/LLE as well as improvement in awareness.  Sessions focused on self-care.  Patient ambulates in the room with rolling walker gathering clothing donning her bra shirt and pants with supervision.  She can stand at the sink to brush her teeth and hair.  Propels a wheelchair independently increase her ambulation up to 136 feet x 2 with therapies.  Full family teaching completed plan discharged to home       Disposition: Discharged to home    Diet: Regular  Special Instructions: No driving smoking or alcohol  Weightbearing as tolerated left lower extremity  Continue supplemental oxygen therapy as prior to admission  Medications at discharge 1.  Tylenol as needed 2.  Xanax 1 mg p.o. nightly 3.  Aspirin 81 mg p.o. daily 4.  Plavix 75 mg p.o. daily 5.  Robaxin 500 mg every 6 hours as needed  muscle spasms 6.  Lopressor 25 mg p.o. twice daily 7.  Multivitamin daily 8.  Oxycodone 5 to 10 mg every 4 hours as needed pain 9.  Protonix 40 mg p.o. daily 10.  Crestor 20 mg p.o. nightly 11.  Effexor 75 mg p.o. daily  30-35 minutes were spent completing  discharge summary and discharge planning     Follow-up Information     Lovorn, Jinny Blossom, MD Follow up.   Specialty: Physical Medicine and Rehabilitation Why: No follow-up needed Contact information: [address] [place] [address] [phone]         Renette Butters, MD Follow up.   Specialty: Orthopedic Surgery Why: Call for appointment Contact information: [address] [place] [address] [phone]                 Signed: Cathlyn Parsons 07/11/2021, 5:55 AM

## 2021-07-07 NOTE — Progress Notes (Signed)
PROGRESS NOTE   Subjective/Complaints:  Feels bloated, wants to get off oxy prior to d/c   ROS:   Pt denies SOB, abd pain, CP, N/V/C/D, and vision changes   Objective:   No results found. No results for input(s): WBC, HGB, HCT, PLT in the last 72 hours.   No results for input(s): NA, K, CL, CO2, GLUCOSE, BUN, CREATININE, CALCIUM in the last 72 hours.    Intake/Output Summary (Last 24 hours) at 07/07/2021 0849 Last data filed at 07/06/2021 2200 Gross per 24 hour  Intake 720 ml  Output --  Net 720 ml         Physical Exam: Vital Signs Blood pressure (!) 117/51, pulse 85, temperature 97.8 F (36.6 C), resp. rate 17, height 5\' 1"  (1.549 m), weight 73.2 kg, SpO2 99 %.  General: No acute distress Mood and affect are appropriate Heart: Regular rate and rhythm no rubs murmurs or extra sounds Lungs: Clear to auscultation, breathing unlabored, no rales or wheezes Abdomen: Positive bowel sounds, soft nontender to palpation, epigastric tympany with detention non painful  Extremities: No clubbing, cyanosis, or edema Skin: No evidence of breakdown, no evidence of rash ral upper and lower extremities Musculoskeletal: reduced AROM left hip   Skin: 2 L hip incisions C/D/I- but underneath have steristrips- a little dried blood- moderate bruising and edema, but no drainage on wound/bandage.  C/D/I- has moderate edema- and worse right at L hip due to positioning with L hip/buttock at base of "V"- looks the same today-very slightly less to stable swelling- 2+ LLE ankle and 1+ on R ankle- also swelling around L knee/medially - swelling spreading out due ot gravity Neurological:    Comments: Patient is alert.  No acute distress.  Oriented x3 and follows commands. Strength intact except pain limited in LLE.   Assessment/Plan: 1. Functional deficits which require 3+ hours per day of interdisciplinary therapy in a comprehensive  inpatient rehab setting. Physiatrist is providing close team supervision and 24 hour management of active medical problems listed below. Physiatrist and rehab team continue to assess barriers to discharge/monitor patient progress toward functional and medical goals  Care Tool:  Bathing    Body parts bathed by patient: Right arm, Left arm, Chest, Abdomen, Right upper leg, Left upper leg, Face, Front perineal area, Buttocks, Right lower leg, Left lower leg   Body parts bathed by helper: Buttocks, Right lower leg, Left lower leg, Front perineal area     Bathing assist Assist Level: Contact Guard/Touching assist     Upper Body Dressing/Undressing Upper body dressing   What is the patient wearing?: Pull over shirt, Bra    Upper body assist Assist Level: Set up assist    Lower Body Dressing/Undressing Lower body dressing      What is the patient wearing?: Pants, Underwear/pull up     Lower body assist Assist for lower body dressing: Moderate Assistance - Patient 50 - 74% (without AE)     Toileting Toileting Toileting Activity did not occur (Clothing management and hygiene only):  (Patient voided in brief and had stool)  Toileting assist Assist for toileting: Moderate Assistance - Patient 50 - 74%  Transfers Chair/bed transfer  Transfers assist  Chair/bed transfer activity did not occur: Safety/medical concerns  Chair/bed transfer assist level: Supervision/Verbal cueing     Locomotion Ambulation   Ambulation assist   Ambulation activity did not occur: Safety/medical concerns  Assist level: Supervision/Verbal cueing Assistive device: Walker-rolling Max distance: 191ft   Walk 10 feet activity   Assist  Walk 10 feet activity did not occur: Safety/medical concerns  Assist level: Supervision/Verbal cueing Assistive device: Walker-rolling   Walk 50 feet activity   Assist Walk 50 feet with 2 turns activity did not occur: Safety/medical concerns  Assist  level: Supervision/Verbal cueing Assistive device: Walker-rolling    Walk 150 feet activity   Assist Walk 150 feet activity did not occur: Safety/medical concerns  Assist level: Supervision/Verbal cueing Assistive device: Walker-rolling    Walk 10 feet on uneven surface  activity   Assist Walk 10 feet on uneven surfaces activity did not occur: Safety/medical concerns         Wheelchair     Assist Will patient use wheelchair at discharge?: Yes Type of Wheelchair: Manual Wheelchair activity did not occur: Safety/medical concerns  Wheelchair assist level: Supervision/Verbal cueing Max wheelchair distance: 134 ft    Wheelchair 50 feet with 2 turns activity    Assist    Wheelchair 50 feet with 2 turns activity did not occur: Safety/medical concerns   Assist Level: Supervision/Verbal cueing   Wheelchair 150 feet activity     Assist  Wheelchair 150 feet activity did not occur: Safety/medical concerns       Blood pressure (!) 117/51, pulse 85, temperature 97.8 F (36.6 C), resp. rate 17, height 5\' 1"  (1.549 m), weight 73.2 kg, SpO2 99 %.    Medical Problem List and Plan: 1.   Debility secondary to left femoral intertrochanteric hip fracture.  Status post IM nailing 06/20/2021.  Weightbearing as tolerated             -patient may shower but incision must be covered             -ELOS/Goals: 2-3 weeks modI    -con't PT and OT- will move d/c date to 7/22 to help family and let pt get a little better- husband has dementia and he will keep asking pt to do things and she's just not good enough at this time- only has intermittent help at home.   -con't PT and OT/CIR 2.  Impaired mobility: continue SCDs.               -antiplatelet therapy: continue Aspirin 81 mg daily and Plavix 75 mg daily 3. Post-operative left hip pain: Continue Oxycodone and Robaxin as needed. History of negative side effects to gabapentin. Placed order to request that oxycodone be  administered 30 minutes prior to therapy sessions  7/12- will have pt take 10 mg Oxy q4 hours prn- she had been trying to avoid taking unless had to- explained that she will do better in therapy if pain better controlled- will get off meds- I promise, but needs for now.   7/17- pain stable- con't regimen 4. Anxiety: Continue Effexor 75 mg daily, Xanax 1 mg- changed to scheduled. Discussed risk of respiratory depression with concurrent use of Xanax and oxycodone- she has been tolerating both together.              -antipsychotic agents: N/A 5. Neuropsych: This patient is capable of making decisions on her own behalf. 6. Skin/Wound Care: Routine skin checks 7. Fluids/Electrolytes/Nutrition: Routine in and  outs with follow-up chemistries 8.  Acute blood loss anemia.  CBC stable. Continue to monitor weekly.   7/13- Hb stable at 8.5- con't to monitor weekly. 7/14- Hb 8.0- down slightly from 7/6, however says has been drinking more- can hemodilute. 7/17- labs in AM   9.  CAD with history of multiple stents.  Continue aspirin Plavix 10.  Hyperlipidemia.  Continue Crestor 11.  Nocturnal oxygen requirements.  Continue oxygen as prior to admission  7/14- O2 drops at night when doesn't wear O2- encouraged to wear at night.  12.  Hypertension.  Well controlled, decrease lopressor to 12.5mg  BID.  Monitor with increased mobility  7/12- BP well controlled- however having dizziness- see #14 for more.-con't regimen for now.   7/13- orthostatics looked good- didn't drop, except pulse got very slightly more- will con't to monitor  7/14- BP went up with AM orthostatics and pulse very slightly more, but not significant- con't to monitor  7/15- no dizziness in last 24 hours- con't to monitor 13.  Constipation.  Continue laxative assistance as directed, add mylicon for gas 14. Lightheaded  7/12- will order Orthostatics to be done- see if it's (+)- if so, will decrease Lopressor- also have pt drink more- she's only  drinking 4 cups/water daily- will increase to at least 6 cups/day.   7/13- Cr looks OK last week- will recheck labs in AM  7/14- Cr stable at 0.57 and BUN 8  7/17- will recheck Monday Orthostatic vitals negative  15. LLE edema  Mostly proximal lateral hip   717 - encouraged pt to wear ACE wrap of LLE from ties to mid thigh until dinner time. Pt states it felt good when it was on but had some pain on removal - edema mild to moverate prox hip  16. Dispo  7/15- will extend to 7/22.   LOS: 13 days A FACE TO FACE EVALUATION WAS PERFORMED  Charlett Blake 07/07/2021, 8:49 AM

## 2021-07-07 NOTE — Progress Notes (Signed)
Occupational Therapy Session Note  Patient Details  Name: Carrie Chung MRN: [id number] Date of Birth: 29-Sep-1941  Today's Date: 07/07/2021 OT Group Time: 1430-1530 OT Group Time Calculation (min): 60 min   Short Term Goals: Week 2:  OT Short Term Goal 1 (Week 2): STGs = LTGs d/t ELOS  Skilled Therapeutic Interventions/Progress Updates:  Pt was seen for skilled group session with focus of group session on  stress management, social interaction, and  education on healthy coping strategies.  Pt reported no pain during session, pt declined standing activity during session. Pt cooperative, interactive and appreciative of group session. Pt transported back to room by rehab tech.   Therapy Documentation Precautions:  Precautions Precautions: Fall Restrictions Weight Bearing Restrictions: No LLE Weight Bearing: Weight bearing as tolerated General:   Vital Signs: Therapy Vitals Temp: 98.1 F (36.7 C) Pulse Rate: 71 Resp: 17 BP: (!) 121/56 Patient Position (if appropriate): Sitting Oxygen Therapy SpO2: 96 % O2 Device: Room Air Pain: Pt reports no pain during session.   Therapy/Group: Group Therapy  Precious Haws 07/07/2021, 4:16 PM

## 2021-07-08 MED ORDER — POTASSIUM CHLORIDE CRYS ER 20 MEQ PO TBCR
40.0000 meq | EXTENDED_RELEASE_TABLET | Freq: Two times a day (BID) | ORAL | Status: AC
  Administered 2021-07-08 (×2): 40 meq via ORAL
  Filled 2021-07-08 (×3): qty 2

## 2021-07-08 MED ORDER — ALPRAZOLAM 0.5 MG PO TABS
0.5000 mg | ORAL_TABLET | Freq: Once | ORAL | Status: AC
  Administered 2021-07-08: 0.5 mg via ORAL
  Filled 2021-07-08: qty 1

## 2021-07-08 MED ORDER — ALPRAZOLAM 0.5 MG PO TABS
0.5000 mg | ORAL_TABLET | Freq: Two times a day (BID) | ORAL | Status: DC
  Administered 2021-07-08 – 2021-07-11 (×6): 0.5 mg via ORAL
  Filled 2021-07-08 (×6): qty 1

## 2021-07-08 NOTE — Progress Notes (Signed)
Occupational Therapy Session Note  Patient Details  Name: REANNE NELLUMS MRN: [id number] Date of Birth: 31-Jan-1941  Today's Date: 07/08/2021 OT Individual Time: 1300-1345 OT Individual Time Calculation (min): 45 min    Short Term Goals: Week 2:  OT Short Term Goal 1 (Week 2): STGs = LTGs d/t ELOS  Skilled Therapeutic Interventions/Progress Updates:    Patient standing at sink with nursing upon arrival.  She is able tolerate standing for 3-4 minutes at a time with CS using RW.  She notes ongoing abdominal discomfort and nausea limiting activity today.   Completed seated and standing activities during session with focus on changing position, light exercise and trunk mobility as tolerated. Completed ambulation with RW in room with CS.  Reviewed breathing techniques, posture and use of incentive spirometer.  She returned to recliner at close of session, seat alarm set and call bell/tray table in reach.    Therapy Documentation Precautions:  Precautions Precautions: Fall Restrictions Weight Bearing Restrictions: No LLE Weight Bearing: Weight bearing as tolerated   Therapy/Group: Individual Therapy  Carlos Levering 07/08/2021, 7:44 AM

## 2021-07-08 NOTE — Progress Notes (Signed)
Occupational Therapy Session Note  Patient Details  Name: Carrie Chung MRN: [id number] Date of Birth: [birth year]  Today's Date: 07/08/2021 OT Individual Time: 0220-2669 OT Individual Time Calculation (min): 40 min  and Today's Date: 07/08/2021 OT Missed Time: 20 Minutes Missed Time Reason: Patient ill (comment) (nausea)   Short Term Goals: Week 1:  OT Short Term Goal 1 (Week 1): Pt will perform BSC/toilet transfer with LRAD MIN A OT Short Term Goal 1 - Progress (Week 1): Met OT Short Term Goal 2 (Week 1): Pt will perform LB dressing sit <> stands level Mod A OT Short Term Goal 2 - Progress (Week 1): Met OT Short Term Goal 3 (Week 1): Pt will perform UB/LB dress with AE PRN with Mod A OT Short Term Goal 3 - Progress (Week 1): Met Week 2:  OT Short Term Goal 1 (Week 2): STGs = LTGs d/t ELOS  Skilled Therapeutic Interventions/Progress Updates:    Pt greeted at time of session sitting up in recliner with emesis bag around her chin, stating she had been nauseous all morning and had bad smells coming from bathroom d/t plumbing issues. Therapist unable to detect smell but sprayed odor elimiating spray for pt comfort and provided peace blend essential oil placed away from pt across room for improved odor for the pt. Provided pt with ice chips for nausea and hand out for energy conservation techniques, briefly reviewed some of these with the pt for ADL tasks. Pt verbalizing anxiety and stress with going home, provided emotional support and motivational interviewing to assist pt in problem solving through finding solution for additional help with husband at home as he now officially has Alzheimers. Pt unable to tolerate further activity at this time. Missed 20 mins d/t nausea.  Therapy Documentation Precautions:  Precautions Precautions: Fall Restrictions Weight Bearing Restrictions: No LLE Weight Bearing: Weight bearing as tolerated     Therapy/Group: Individual Therapy  Viona Gilmore 07/08/2021, 7:29 AM

## 2021-07-08 NOTE — Progress Notes (Signed)
Patient ID: Carrie Chung, female   DOB: [birth year], 80 y.o.   MRN: [id number] Met with pt to update regarding team conference progress and discharge 7/22. She feels she will need a rental wheelchair and is aware private pay still wants to order one. Referral made to [employer] for this. Have sent referral to [facility] for follow up with call pt to set up appointment. Pt's husband is already going there for therapy. Work toward discharge Friday

## 2021-07-08 NOTE — Patient Care Conference (Signed)
Inpatient RehabilitationTeam Conference and Plan of Care Update Date: 07/08/2021   Time: 11:36 AM    Patient Name: Carrie Chung      Medical Record Number: [id number]  Date of Birth: 1941-06-20 Sex: Female         Room/Bed: 4M05C/4M05C-01 Payor Info: Payor: MEDICARE / Plan: MEDICARE PART A AND B / Product Type: *No Product type* /    Admit Date/Time:  06/24/2021  4:02 PM  Primary Diagnosis:  Intertrochanteric fracture of left hip [facility])  Hospital Problems: Principal Problem:   Intertrochanteric fracture of left hip [facility])    Expected Discharge Date: Expected Discharge Date: 07/11/21  Team Members Present: Physician leading conference: Dr. Courtney Heys Care Coodinator Present: Ovidio Kin, LCSW;Shanda Cadotte Creig Hines, RN, BSN, [place] Nurse Present: Dorthula Nettles, RN PT Present: Other (comment) Ailene Rud, PT) OT Present: Lillia Corporal, OT PPS Coordinator present : Gunnar Fusi, SLP     Current Status/Progress Goal Weekly Team Focus  Bowel/Bladder             Swallow/Nutrition/ Hydration             ADL's   Supervision bathing, Supervision ADL transfers, toileting Supervision, LB dress Supervision w/ AE, nausea limiting today  Supervision bathe/LB dress, Mod I toilet  standing balance/tolerance, unilateral standing, ADL retraining, continue AE training, coping strategies/stress management   Mobility   supervision gait and transfers  mod I transfers, supervision gait  LE strength, ROM   Communication             Safety/Cognition/ Behavioral Observations            Pain             Skin               Discharge Planning:  Has hired some assist for pt and husband will be transporting to appt and assisting 9-1 pm. Daughter can check on daily   Team Discussion: Can use Hydrocodone for pain. Lots of edema in left leg. Ace wrap from toes up to mid-thigh. Continent B/B, 6/10 pain to hip/thigh.  Patient on target to meet rehab goals: yes, very anxious. Supervision  ADL's, contact guard/supervision toileting. Mod I goals for toileting. Doing very well. Supervision/mod I for transfers.  *See Care Plan and progress notes for long and short-term goals.   Revisions to Treatment Plan:  Not at this time.  Teaching Needs: Family education, medication management, pain management, skin/wound care, weight bearing education, transfer training, gait training, balance training, endurance training, anxiety management, safety awareness.  Current Barriers to Discharge: Decreased caregiver support, Medical stability, Home enviroment access/layout, Wound care, Lack of/limited family support, Weight, Weight bearing restrictions, Medication compliance, and Behavior  Possible Resolutions to Barriers: Continue current medications, provide emotional support.     Medical Summary Current Status: continent B/B; 6/10 pain- have reduced Oxy; no sleep issues; L hip wounds x2-  Barriers to Discharge: Decreased family/caregiver support;Home enviroment access/layout;Behavior;Weight bearing restrictions;Wound care  Barriers to Discharge Comments: plans to go home with husband- will have 8 hours/day caretaker- husband has Alzheimer's. Possible Resolutions to [employer]: is Supervision for ADLs-  will be mod I for toileting/almost mod I otherwise; missed time due to nausea this AM; working on ACE wrap LLE for edema control- reduced Oxy to 5 mg q4 hours prn-  d/c 7/22 firm d/c.   Continued Need for Acute Rehabilitation Level of Care: The patient requires daily medical management by a physician with specialized training in physical medicine  and rehabilitation for the following reasons: Direction of a multidisciplinary physical rehabilitation program to maximize functional independence : Yes Medical management of patient stability for increased activity during participation in an intensive rehabilitation regime.: Yes Analysis of laboratory values and/or radiology reports with any  subsequent need for medication adjustment and/or medical intervention. : Yes   I attest that I was present, lead the team conference, and concur with the assessment and plan of the team.   Cristi Loron 07/08/2021, 6:17 PM

## 2021-07-08 NOTE — Progress Notes (Signed)
Physical Therapy Session Note  Patient Details  Name: Carrie Chung MRN: [id number] Date of Birth: 01-11-1941  Today's Date: 07/08/2021 PT Individual Time: 4098-1191 PT Individual Time Calculation (min): 53 min   Short Term Goals: Week 1:  PT Short Term Goal 1 (Week 1): pt will perform bed/chair transfers with CGA and LRAD PT Short Term Goal 1 - Progress (Week 1): Met PT Short Term Goal 2 (Week 1): Pt will initiate gait training with LRAD PT Short Term Goal 2 - Progress (Week 1): Met PT Short Term Goal 3 (Week 1): Pt will initiate stair training PT Short Term Goal 3 - Progress (Week 1): Met Week 2:  PT Short Term Goal 1 (Week 2): =LTGs due to ELOS  Skilled Therapeutic Interventions/Progress Updates:    Pt received in recliner and agreeable to therapy.  Pt reports 4/10 pain which rose to 6/10 with activity, but required no immediate intervention. Pt reports that she had felt nauseous throughout the day, which largely resolved after receiving xanax. Pt performed ambulatory transfer to w/c with RW and supervision. Gait and transfers fading to mod I with RW at end of session. Gait x 100 ft with RW supervision->modI. Pt navigated ramp, uneven surface, and curb step with supervision, cueing for technique on step. Pt then performed nustep on lv 3 at 50+ spm for 13 min while discussing coping mechanisms and possible support groups to utilize at discharge. Pt returned to room and returned to recliner mod I. Left with all needs in reach and chair alarm active.  Therapy Documentation Precautions:  Precautions Precautions: Fall Restrictions Weight Bearing Restrictions: No LLE Weight Bearing: Weight bearing as tolerated    Therapy/Group: Individual Therapy  Mickel Fuchs 07/08/2021, 3:13 PM

## 2021-07-08 NOTE — Progress Notes (Signed)
Occupational Therapy Session Note  Patient Details  Name: Carrie Chung MRN: [id number] Date of Birth: [birth year]  Today's Date: 07/08/2021 OT Individual Time: 6073-7106 OT Individual Time Calculation (min): 60 min    Short Term Goals: Week 2:  OT Short Term Goal 1 (Week 2): STGs = LTGs d/t ELOS  Skilled Therapeutic Interventions/Progress Updates:  Pt received supine in bed with pt reporting that she had a rough night last night, as pt reports increased pain in LLE and nausea. Pt agreeable to OT intervention. Session focus on BADL reeducation and household distance functional mobility. Pt currently requires set- up for oral care, set- up for bathing from EOB, set- up for UB dressing and MOD A for LB dressing, pt would benefit from reacher, education provided on using reacher for LB dressing with pt verbalizing understanding. Pt required CGA throughout session for sit<>stands and functional mobility with RW. Practiced ADL item retrieval in pts room with pt utilizing  RW bag to assist with fall prevention and energy conservation. Pt left seated in recliner with chair alarm activated and all needs within reach.   Therapy Documentation Precautions:  Precautions Precautions: Fall Restrictions Weight Bearing Restrictions: No LLE Weight Bearing: Weight bearing as tolerated    Pain: Pt reports pain in LLE, pt also experiencing nausea, pt declined nausea meds. Offered rest breaks repositioning and deep breathing as pain mgmt strategies.      Therapy/Group: Individual Therapy  Corinne Ports [facility] 07/08/2021, 12:11 PM

## 2021-07-08 NOTE — Progress Notes (Addendum)
PROGRESS NOTE   Subjective/Complaints:  Pt reports pain is "OK"- wants to get off Oxycodone before d/c- reduced to 5 mg q4 hours prn- cannot use Tramadol due to prolonged QT. LBM this AM-  Now nauseated- burped and then felt better- doesn't want anti-nausea medicine- "husband was radiation oncologist" so had told her to not use anti-nausea meds per pt in past.   ROS:   Pt denies SOB, abd pain, CP, N/V/C/D, and vision changes   Objective:   No results found. Recent Labs    07/07/21 0806  WBC 3.7*  HGB 8.4*  HCT 26.9*  PLT 208     Recent Labs    07/07/21 0806  NA 141  K 3.3*  CL 103  CO2 33*  GLUCOSE 148*  BUN 5*  CREATININE 0.56  CALCIUM 8.5*      Intake/Output Summary (Last 24 hours) at 07/08/2021 2197 Last data filed at 07/08/2021 0841 Gross per 24 hour  Intake 510 ml  Output --  Net 510 ml        Physical Exam: Vital Signs Blood pressure 126/67, pulse 92, temperature 98 F (36.7 C), resp. rate 18, height 5\' 1"  (1.549 m), weight 73.2 kg, SpO2 93 %.    General: awake, alert, appropriate, walked to bed in front of me- close SBA; using RW: and sat back up at EOB; gave ginerale; NAD HENT: conjugate gaze; oropharynx moist CV: regular rate; no JVD Pulmonary: CTA B/L; no W/R/R- good air movement GI: soft, NT, ND, (+)BS- hypoactive (just had BM)- c/o nausea- but not TTP Psychiatric: appropriate; interactive Neurological: Ox3  Extremities: No clubbing, cyanosis, or edema Skin: No evidence of breakdown, no evidence of rash ral upper and lower extremities Musculoskeletal: reduced AROM left hip   Skin: L hip and femur incisions look great- accidentally took off steristrips on top inicsion- some serous drainage on dressing;  Edema still moderate - Edema and worse right at L hip due to positioning with L hip/buttock at base of "V"- looks the same today-very slightly less to stable swelling- 2+ LLE  ankle and 1+ on R ankle- also swelling around L knee/medially - swelling spreading out due ot gravity Neurological:    Comments: Patient is alert.  No acute distress.  Oriented x3 and follows commands. Strength intact except pain limited in LLE.   Assessment/Plan: 1. Functional deficits which require 3+ hours per day of interdisciplinary therapy in a comprehensive inpatient rehab setting. Physiatrist is providing close team supervision and 24 hour management of active medical problems listed below. Physiatrist and rehab team continue to assess barriers to discharge/monitor patient progress toward functional and medical goals  Care Tool:  Bathing    Body parts bathed by patient: Right arm, Left arm, Chest, Abdomen, Right upper leg, Left upper leg, Face, Front perineal area, Buttocks, Right lower leg, Left lower leg   Body parts bathed by helper: Buttocks, Right lower leg, Left lower leg, Front perineal area     Bathing assist Assist Level: Contact Guard/Touching assist     Upper Body Dressing/Undressing Upper body dressing   What is the patient wearing?: Pull over shirt, Bra    Upper body assist Assist  Level: Set up assist    Lower Body Dressing/Undressing Lower body dressing      What is the patient wearing?: Pants, Underwear/pull up     Lower body assist Assist for lower body dressing: Moderate Assistance - Patient 50 - 74% (without AE)     Toileting Toileting Toileting Activity did not occur (Clothing management and hygiene only):  (Patient voided in brief and had stool)  Toileting assist Assist for toileting: Moderate Assistance - Patient 50 - 74%     Transfers Chair/bed transfer  Transfers assist  Chair/bed transfer activity did not occur: Safety/medical concerns  Chair/bed transfer assist level: Supervision/Verbal cueing     Locomotion Ambulation   Ambulation assist   Ambulation activity did not occur: Safety/medical concerns  Assist level:  Supervision/Verbal cueing Assistive device: Walker-rolling Max distance: 146ft   Walk 10 feet activity   Assist  Walk 10 feet activity did not occur: Safety/medical concerns  Assist level: Supervision/Verbal cueing Assistive device: Walker-rolling   Walk 50 feet activity   Assist Walk 50 feet with 2 turns activity did not occur: Safety/medical concerns  Assist level: Supervision/Verbal cueing Assistive device: Walker-rolling    Walk 150 feet activity   Assist Walk 150 feet activity did not occur: Safety/medical concerns  Assist level: Supervision/Verbal cueing Assistive device: Walker-rolling    Walk 10 feet on uneven surface  activity   Assist Walk 10 feet on uneven surfaces activity did not occur: Safety/medical concerns         Wheelchair     Assist Will patient use wheelchair at discharge?: Yes Type of Wheelchair: Manual Wheelchair activity did not occur: Safety/medical concerns  Wheelchair assist level: Supervision/Verbal cueing Max wheelchair distance: 134 ft    Wheelchair 50 feet with 2 turns activity    Assist    Wheelchair 50 feet with 2 turns activity did not occur: Safety/medical concerns   Assist Level: Supervision/Verbal cueing   Wheelchair 150 feet activity     Assist  Wheelchair 150 feet activity did not occur: Safety/medical concerns       Blood pressure 126/67, pulse 92, temperature 98 F (36.7 C), resp. rate 18, height 5\' 1"  (1.549 m), weight 73.2 kg, SpO2 93 %.    Medical Problem List and Plan: 1.   Debility secondary to left femoral intertrochanteric hip fracture.  Status post IM nailing 06/20/2021.  Weightbearing as tolerated             -patient may shower but incision must be covered             -ELOS/Goals: 2-3 weeks modI    -con't PT and OT- will move d/c date to 7/22 to help family and let pt get a little better- husband has dementia and he will keep asking pt to do things and she's just not good enough at  this time- only has intermittent help at home.   -Con't PT and OT- CIR 2.  Impaired mobility: continue SCDs.               -antiplatelet therapy: continue Aspirin 81 mg daily and Plavix 75 mg daily 3. Post-operative left hip pain: Continue Oxycodone and Robaxin as needed. History of negative side effects to gabapentin. Placed order to request that oxycodone be administered 30 minutes prior to therapy sessions  7/12- will have pt take 10 mg Oxy q4 hours prn- she had been trying to avoid taking unless had to- explained that she will do better in therapy if pain better controlled-  will get off meds- I promise, but needs for now.   77/19- pain OK with decrease in Oxy to 5 mg q4 hours prn- cannot use Tramadol due to prolonged QT. 4. Anxiety: Continue Effexor 75 mg daily, Xanax 1 mg- changed to scheduled. Discussed risk of respiratory depression with concurrent use of Xanax and oxycodone- she has been tolerating both together.              -antipsychotic agents: N/A 5. Neuropsych: This patient is capable of making decisions on her own behalf. 6. Skin/Wound Care: Routine skin checks 7. Fluids/Electrolytes/Nutrition: Routine in and outs with follow-up chemistries 8.  Acute blood loss anemia.  CBC stable. Continue to monitor weekly.   7/13- Hb stable at 8.5- con't to monitor weekly. 7/14- Hb 8.0- down slightly from 7/6, however says has been drinking more- can hemodilute. 7/17- labs in AM  7/19- Hb 8.4- stable- con't regimen  9.  CAD with history of multiple stents.  Continue aspirin Plavix 10.  Hyperlipidemia.  Continue Crestor 11.  Nocturnal oxygen requirements.  Continue oxygen as prior to admission  7/14- O2 drops at night when doesn't wear O2- encouraged to wear at night.  12.  Hypertension.  Well controlled, decrease lopressor to 12.5mg  BID.  Monitor with increased mobility  7/12- BP well controlled- however having dizziness- see #14 for more.-con't regimen for now.   7/13- orthostatics looked  good- didn't drop, except pulse got very slightly more- will con't to monitor  7/14- BP went up with AM orthostatics and pulse very slightly more, but not significant- con't to monitor  7/15- no dizziness in last 24 hours- con't to monitor 13.  Constipation.  Continue laxative assistance as directed, add mylicon for gas 14. Lightheaded  7/12- will order Orthostatics to be done- see if it's (+)- if so, will decrease Lopressor- also have pt drink more- she's only drinking 4 cups/water daily- will increase to at least 6 cups/day.   7/13- Cr looks OK last week- will recheck labs in AM  7/14- Cr stable at 0.57 and BUN 8  7/19- Cr 0.56- con't regimen Orthostatic vitals negative  15. LLE edema  Mostly proximal lateral hip   717 - encouraged pt to wear ACE wrap of LLE from ties to mid thigh until dinner time. Pt states it felt good when it was on but had some pain on removal - edema mild to moverate prox hip  16. Hypokalemia  7/19- will replete with 40 mEq x2 and recheck Thursday 16. Dispo  7/15- will extend to 7/22.   LOS: 14 days A FACE TO FACE EVALUATION WAS PERFORMED  Karyl Sharrar 07/08/2021, 9:22 AM

## 2021-07-09 ENCOUNTER — Inpatient Hospital Stay (HOSPITAL_COMMUNITY): Payer: Medicare Other

## 2021-07-09 DIAGNOSIS — R110 Nausea: Secondary | ICD-10-CM

## 2021-07-09 LAB — URINALYSIS, ROUTINE W REFLEX MICROSCOPIC
Bilirubin Urine: NEGATIVE
Glucose, UA: NEGATIVE mg/dL
Hgb urine dipstick: NEGATIVE
Ketones, ur: NEGATIVE mg/dL
Leukocytes,Ua: NEGATIVE
Nitrite: NEGATIVE
Protein, ur: NEGATIVE mg/dL
Specific Gravity, Urine: 1.004 — ABNORMAL LOW (ref 1.005–1.030)
pH: 7 (ref 5.0–8.0)

## 2021-07-09 MED ORDER — PROCHLORPERAZINE MALEATE 5 MG PO TABS
5.0000 mg | ORAL_TABLET | Freq: Four times a day (QID) | ORAL | Status: DC | PRN
  Administered 2021-07-09 – 2021-07-11 (×3): 5 mg via ORAL
  Filled 2021-07-09 (×3): qty 1

## 2021-07-09 NOTE — Progress Notes (Signed)
PROGRESS NOTE   Subjective/Complaints:  Pt reports still having nausea- daughter says she thinks it's anxiety about d/c.  Pt reports it's a little better- wanted something for nausea, but was scared it would make her worse- Zofran not helpful- has Compazine prn- cannot give phenergan due to QT prolongation.   Admits didn't eat anything yesterday before pain meds/overall.  Also Xanax dose during day was helpful yesterday for anxiety and somewhat for nausea.   Right now, when talking about going home, started to feel like would vomit- not sure if timing meant anything?  LBM x2 yesterday.   KUB looks great.   ROS:   Pt denies SOB, abd pain, CP, C/D, and vision changes    Objective:   DG Abd 1 View  Result Date: 07/09/2021 CLINICAL DATA:  New onset nausea and vomiting for 24 hours. EXAM: ABDOMEN - 1 VIEW COMPARISON:  None. FINDINGS: Portable supine view of the abdomen obtained. Air within nondilated stomach. Air within nondilated small and large bowel. No evidence of obstruction. Minimal formed stool in the colon. No visualized radiopaque calculi or abnormal soft tissue calcifications. Lumbar spine degenerative change. IMPRESSION: Nonobstructive bowel gas pattern. Electronically Signed   By: Keith Rake M.D.   On: 07/09/2021 15:09   Recent Labs    07/07/21 0806  WBC 3.7*  HGB 8.4*  HCT 26.9*  PLT 208     Recent Labs    07/07/21 0806  NA 141  K 3.3*  CL 103  CO2 33*  GLUCOSE 148*  BUN 5*  CREATININE 0.56  CALCIUM 8.5*      Intake/Output Summary (Last 24 hours) at 07/09/2021 1519 Last data filed at 07/09/2021 1312 Gross per 24 hour  Intake 390 ml  Output --  Net 390 ml        Physical Exam: Vital Signs Blood pressure (!) 156/77, pulse 86, temperature 97.8 F (36.6 C), resp. rate 17, height 5\' 1"  (1.549 m), weight 73.2 kg, SpO2 96 %.     General: awake, alert, appropriate, laying in bed-  started c/o nausea when discussing her husband; gave emesis bag- no vomiting; NAD HENT: conjugate gaze; oropharynx moist CV: regular rate; no JVD Pulmonary: CTA B/L; no W/R/R- good air movement GI: soft, not TTP- cannot reproduce/make Sx's worse; normoactive BS, ND, (+)BS;  Psychiatric: appropriate but anxious Neurological: Ox3  Extremities: No clubbing, cyanosis, or edema Skin: No evidence of breakdown, no evidence of rash ral upper and lower extremities Musculoskeletal: reduced AROM left hip   Skin: Edema after ACE wraps much improved- L knee is down to 2+ from 3+ and L ankle is 1+ down from 2+.   - Edema and worse right at L hip due to positioning with L hip/buttock at base of "V"- looks the same today-very slightly less to stable swelling- 2+ LLE ankle and 1+ on R ankle- also swelling around L knee/medially - swelling spreading out due ot gravity Neurological:    Comments: Patient is alert.  No acute distress.  Oriented x3 and follows commands. Strength intact except pain limited in LLE.   Assessment/Plan: 1. Functional deficits which require 3+ hours per day of interdisciplinary therapy in a  comprehensive inpatient rehab setting. Physiatrist is providing close team supervision and 24 hour management of active medical problems listed below. Physiatrist and rehab team continue to assess barriers to discharge/monitor patient progress toward functional and medical goals  Care Tool:  Bathing    Body parts bathed by patient: Front perineal area, Face, Buttocks   Body parts bathed by helper: Buttocks, Right lower leg, Left lower leg, Front perineal area     Bathing assist Assist Level: Set up assist     Upper Body Dressing/Undressing Upper body dressing   What is the patient wearing?: Pull over shirt, Bra    Upper body assist Assist Level: Set up assist    Lower Body Dressing/Undressing Lower body dressing      What is the patient wearing?: Pants, Underwear/pull up      Lower body assist Assist for lower body dressing: Moderate Assistance - Patient 50 - 74% (no AE)     Toileting Toileting Toileting Activity did not occur Landscape architect and hygiene only):  (Patient voided in brief and had stool)  Toileting assist Assist for toileting: Moderate Assistance - Patient 50 - 74%     Transfers Chair/bed transfer  Transfers assist  Chair/bed transfer activity did not occur: Safety/medical concerns  Chair/bed transfer assist level: Supervision/Verbal cueing     Locomotion Ambulation   Ambulation assist   Ambulation activity did not occur: Safety/medical concerns  Assist level: Supervision/Verbal cueing Assistive device: Walker-rolling Max distance: 150ft   Walk 10 feet activity   Assist  Walk 10 feet activity did not occur: Safety/medical concerns  Assist level: Supervision/Verbal cueing Assistive device: Walker-rolling   Walk 50 feet activity   Assist Walk 50 feet with 2 turns activity did not occur: Safety/medical concerns  Assist level: Supervision/Verbal cueing Assistive device: Walker-rolling    Walk 150 feet activity   Assist Walk 150 feet activity did not occur: Safety/medical concerns  Assist level: Supervision/Verbal cueing Assistive device: Walker-rolling    Walk 10 feet on uneven surface  activity   Assist Walk 10 feet on uneven surfaces activity did not occur: Safety/medical concerns   Assist level: Supervision/Verbal cueing     Wheelchair     Assist Will patient use wheelchair at discharge?: Yes Type of Wheelchair: Manual Wheelchair activity did not occur: Safety/medical concerns  Wheelchair assist level: Set up assist Max wheelchair distance: 150 ft    Wheelchair 50 feet with 2 turns activity    Assist    Wheelchair 50 feet with 2 turns activity did not occur: Safety/medical concerns   Assist Level: Independent   Wheelchair 150 feet activity     Assist  Wheelchair 150 feet  activity did not occur: Safety/medical concerns   Assist Level: Independent   Blood pressure (!) 156/77, pulse 86, temperature 97.8 F (36.6 C), resp. rate 17, height 5\' 1"  (1.549 m), weight 73.2 kg, SpO2 96 %.    Medical Problem List and Plan: 1.   Debility secondary to left femoral intertrochanteric hip fracture.  Status post IM nailing 06/20/2021.  Weightbearing as tolerated             -patient may shower but incision must be covered             -ELOS/Goals: 2-3 weeks modI    -con't PT and OT- will move d/c date to 7/22 to help family and let pt get a little better- husband has dementia and he will keep asking pt to do things and she's just not good  enough at this time- only has intermittent help at home.   -con't PT and OT- WBAT on LLE 2.  Impaired mobility: continue SCDs.               -antiplatelet therapy: continue Aspirin 81 mg daily and Plavix 75 mg daily 3. Post-operative left hip pain: Continue Oxycodone and Robaxin as needed. History of negative side effects to gabapentin. Placed order to request that oxycodone be administered 30 minutes prior to therapy sessions  7/12- will have pt take 10 mg Oxy q4 hours prn- she had been trying to avoid taking unless had to- explained that she will do better in therapy if pain better controlled- will get off meds- I promise, but needs for now.   77/19- pain OK with decrease in Oxy to 5 mg q4 hours prn- cannot use Tramadol due to prolonged QT.  7/20- if tolerates still, will see if can change to Norco tomorrow 4. Anxiety: Continue Effexor 75 mg daily, Xanax 1 mg- changed to scheduled. Discussed risk of respiratory depression with concurrent use of Xanax and oxycodone- she has been tolerating both together.              -antipsychotic agents: N/A 5. Neuropsych: This patient is capable of making decisions on her own behalf. 6. Skin/Wound Care: Routine skin checks 7. Fluids/Electrolytes/Nutrition: Routine in and outs with follow-up chemistries 8.   Acute blood loss anemia.  CBC stable. Continue to monitor weekly.   7/13- Hb stable at 8.5- con't to monitor weekly. 7/14- Hb 8.0- down slightly from 7/6, however says has been drinking more- can hemodilute. 7/17- labs in AM  7/19- Hb 8.4- stable- con't regimen  9.  CAD with history of multiple stents.  Continue aspirin Plavix 10.  Hyperlipidemia.  Continue Crestor 11.  Nocturnal oxygen requirements.  Continue oxygen as prior to admission  7/14- O2 drops at night when doesn't wear O2- encouraged to wear at night.  12.  Hypertension.  Well controlled, decrease lopressor to 12.5mg  BID.  Monitor with increased mobility  7/12- BP well controlled- however having dizziness- see #14 for more.-con't regimen for now.   7/13- orthostatics looked good- didn't drop, except pulse got very slightly more- will con't to monitor  7/14- BP went up with AM orthostatics and pulse very slightly more, but not significant- con't to monitor  7/15- no dizziness in last 24 hours- con't to monitor 13.  Constipation.  Continue laxative assistance as directed, add mylicon for gas 14. Lightheaded  7/12- will order Orthostatics to be done- see if it's (+)- if so, will decrease Lopressor- also have pt drink more- she's only drinking 4 cups/water daily- will increase to at least 6 cups/day.   7/13- Cr looks OK last week- will recheck labs in AM  7/14- Cr stable at 0.57 and BUN 8  7/19- Cr 0.56- con't regimen Orthostatic vitals negative  15. LLE edema  Mostly proximal lateral hip   717 - encouraged pt to wear ACE wrap of LLE from ties to mid thigh until dinner time. Pt states it felt good when it was on but had some pain on removal - edema mild to moverate prox hip   7/20- edema much better- with ACE wrap- will con't.  16. Hypokalemia  7/19- will replete with 40 mEq x2 and recheck Thursday 17. Nausea  7/20- no vomiting- anxiety? Not constipated based on KUB- will change Zofran to Compazine prn- cannot use phenergan due  to QT prolongation.  16. Dispo  7/15- will extend to 7/22.   LOS: 15 days A FACE TO FACE EVALUATION WAS PERFORMED  Shalae Belmonte 07/09/2021, 3:19 PM

## 2021-07-09 NOTE — Progress Notes (Signed)
Occupational Therapy Note  Patient Details  Name: Carrie Chung MRN: [id number] Date of Birth: [birth year]  Today's Date: 07/09/2021 OT Missed Time: 56 Minutes Missed Time Reason: Unavailable (comment);Other (comment) (out of room at xray)  Pt missed 60 mins of group session as pt was out of room at x-ray, will make up missed minutes as time allows.    Corinne Ports [facility] 07/09/2021, 4:26 PM

## 2021-07-09 NOTE — Progress Notes (Signed)
Patient ID: Carrie Chung, female   DOB: 12/17/1941, 80 y.o.   MRN: [id number] Pt and daughter want home health for a few weeks and then transition to [facility]. No preference by either, contacted Nanine Means who can open her case on 7/23. Pt and daughter aware of this. Pt is feeling somewhat better about going hoe but is anxious regarding husband-care and establishing routine. Will have 8 hr caregiver in the home to assist both.

## 2021-07-09 NOTE — Progress Notes (Signed)
Pt is c/o urine urgency and frequency, Nausea and vomiting. Compazine given with some relief. Pt has now develop a low grade temp 100.3. pt concerned  that  the symptoms are similar to when she has cystitis. MD Geralynn Ochs called and due to new symptoms and elevated temp, an order was verbally given to obtain a UA with C&S.

## 2021-07-09 NOTE — Progress Notes (Signed)
Physical Therapy Session Note  Patient Details  Name: Carrie Chung MRN: [id number] Date of Birth: 29-Feb-1941  Today's Date: 07/09/2021 PT Individual Time: 0900-1000,1115-1157,  PT Individual Time Calculation (min): 60 min 43 min,  Short Term Goals: Week 1:  PT Short Term Goal 1 (Week 1): pt will perform bed/chair transfers with CGA and LRAD PT Short Term Goal 1 - Progress (Week 1): Met PT Short Term Goal 2 (Week 1): Pt will initiate gait training with LRAD PT Short Term Goal 2 - Progress (Week 1): Met PT Short Term Goal 3 (Week 1): Pt will initiate stair training PT Short Term Goal 3 - Progress (Week 1): Met Week 2:  PT Short Term Goal 1 (Week 2): =LTGs due to ELOS  Skilled Therapeutic Interventions/Progress Updates:    Session 1: pt received in bed and agreeable to therapy. No complaint of pain. Pt demoes depressed affect and desire to stay in bed, agreeable to therapy with minimal coaxing. Pt ambulated in room distances with RW and distant supervision to gather clothed, and stand at sink to apply baby powder and brush teeth. Pt then dressed in sitting with supervision. Pt propelled w/c with BUE 2 x 150 ft with supervision. ambulatory transfer to mat table with supervision and RW. Pt performed Step taps with 4" step and RW in preparation for stair navigation, progressing to step ups in the same manner. Pt returned to room and performed ambulatory transfer with RW and supervision. Pt left with Chung needs in reach and chair alarm active.   Session 2: Pt received in recliner and agreeable to therapy.  Pt reports 2/10 pain at rest, some increase with activity but did not require intervention at this time. Sit to stand with RW supervision throughout session. ambulatory transfer with RW and supervision to w/c. Pt propelled w/c with BUE 2 x 150 ft with verbal cueing to manage leg rests. Pt navigated 3" steps  2x8 with BIL handrails and close supervision. Pt required extended rest break after  stair navigation due to fatigue. Pt navigated 8" curb with CGA and minimal cues to recall sequencing. Pt then navigated 4x6" steps with supervision and BIL handrails. Pt extremely fatigued after stair navigation. Pt returned to room and performed ambulatory transfer with RW and distant supervision. Pt left in room with chair alarm set. Chung needs in reach.  Session 3: Pt received in recliner and agreeable to therapy.  No complaint of pain. Mod I ambulatory transfer to w/c with RW. Pt propelled w/c with BUE mod I to/from outdoor area for community integration and for improved mood. Pt performed a series of reaching tasks to improve balance and confidence, reduce dependence on AD. Gait on uneven surfaces x 200 ft with 1 rest break. Verbal cue for incr stance time on LLE to normalize gait pattern, pt demoed improvement with repetition. Pt returned to room and performed mod I ambulatory transfer with RW to recliner, left with Chung needs in reach at end of session.   Therapy Documentation Precautions:  Precautions Precautions: Fall Restrictions Weight Bearing Restrictions: Yes LLE Weight Bearing: Weight bearing as tolerated    Therapy/Group: Individual Therapy  Mickel Fuchs 07/09/2021, 10:28 AM

## 2021-07-10 ENCOUNTER — Inpatient Hospital Stay (HOSPITAL_COMMUNITY): Payer: Medicare Other

## 2021-07-10 LAB — CBC WITH DIFFERENTIAL/PLATELET
Abs Immature Granulocytes: 0.03 10*3/uL (ref 0.00–0.07)
Basophils Absolute: 0 10*3/uL (ref 0.0–0.1)
Basophils Relative: 0 %
Eosinophils Absolute: 0.1 10*3/uL (ref 0.0–0.5)
Eosinophils Relative: 1 %
HCT: 29.5 % — ABNORMAL LOW (ref 36.0–46.0)
Hemoglobin: 9.3 g/dL — ABNORMAL LOW (ref 12.0–15.0)
Immature Granulocytes: 1 %
Lymphocytes Relative: 18 %
Lymphs Abs: 0.9 10*3/uL (ref 0.7–4.0)
MCH: 30.5 pg (ref 26.0–34.0)
MCHC: 31.5 g/dL (ref 30.0–36.0)
MCV: 96.7 fL (ref 80.0–100.0)
Monocytes Absolute: 0.4 10*3/uL (ref 0.1–1.0)
Monocytes Relative: 8 %
Neutro Abs: 3.8 10*3/uL (ref 1.7–7.7)
Neutrophils Relative %: 72 %
Platelets: 246 10*3/uL (ref 150–400)
RBC: 3.05 MIL/uL — ABNORMAL LOW (ref 3.87–5.11)
RDW: 13.9 % (ref 11.5–15.5)
WBC: 5.3 10*3/uL (ref 4.0–10.5)
nRBC: 0 % (ref 0.0–0.2)

## 2021-07-10 LAB — BASIC METABOLIC PANEL
Anion gap: 10 (ref 5–15)
BUN: <5 mg/dL — ABNORMAL LOW (ref 8–23)
CO2: 29 mmol/L (ref 22–32)
Calcium: 9 mg/dL (ref 8.9–10.3)
Chloride: 103 mmol/L (ref 98–111)
Creatinine, Ser: 0.54 mg/dL (ref 0.44–1.00)
GFR, Estimated: >60 mL/min (ref >60)
Glucose, Bld: 132 mg/dL — ABNORMAL HIGH (ref 70–99)
Potassium: 3.5 mmol/L (ref 3.5–5.1)
Sodium: 142 mmol/L (ref 135–145)

## 2021-07-10 MED ORDER — POTASSIUM CHLORIDE CRYS ER 10 MEQ PO TBCR
10.0000 meq | EXTENDED_RELEASE_TABLET | Freq: Every day | ORAL | Status: DC
  Administered 2021-07-10 – 2021-07-11 (×2): 10 meq via ORAL
  Filled 2021-07-10 (×2): qty 1

## 2021-07-10 MED ORDER — ADULT MULTIVITAMIN W/MINERALS CH: 1.0000 | ORAL_TABLET | Freq: Every day | ORAL | Status: DC

## 2021-07-10 MED ORDER — METHOCARBAMOL 500 MG PO TABS: 500.0000 mg | ORAL_TABLET | Freq: Four times a day (QID) | ORAL | 0 refills | Status: DC | PRN

## 2021-07-10 MED ORDER — OXYCODONE HCL 5 MG PO TABS: 5.0000 mg | ORAL_TABLET | ORAL | 0 refills | Status: DC | PRN

## 2021-07-10 MED ORDER — CALCIUM CARBONATE-VITAMIN D 600-400 MG-UNIT PO TABS: 1.0000 | ORAL_TABLET | Freq: Every evening | ORAL | 0 refills | Status: DC

## 2021-07-10 MED ORDER — ESOMEPRAZOLE MAGNESIUM 40 MG PO CPDR: 40.0000 mg | DELAYED_RELEASE_CAPSULE | Freq: Every day | ORAL | 0 refills | Status: DC

## 2021-07-10 MED ORDER — ALPRAZOLAM 0.5 MG PO TABS: 0.5000 mg | ORAL_TABLET | Freq: Two times a day (BID) | ORAL | 0 refills | Status: DC

## 2021-07-10 MED ORDER — ROSUVASTATIN CALCIUM 20 MG PO TABS: ORAL_TABLET | ORAL | 3 refills | Status: DC

## 2021-07-10 MED ORDER — NITROGLYCERIN 0.4 MG SL SUBL: 0.4000 mg | SUBLINGUAL_TABLET | SUBLINGUAL | 3 refills | Status: DC | PRN

## 2021-07-10 MED ORDER — ACETAMINOPHEN 325 MG PO TABS: 325.0000 mg | ORAL_TABLET | ORAL | Status: DC | PRN

## 2021-07-10 MED ORDER — SIMETHICONE 80 MG PO CHEW
160.0000 mg | CHEWABLE_TABLET | Freq: Four times a day (QID) | ORAL | Status: DC | PRN
  Administered 2021-07-10 – 2021-07-11 (×2): 160 mg via ORAL
  Filled 2021-07-10 (×3): qty 2

## 2021-07-10 MED ORDER — CLOPIDOGREL BISULFATE 75 MG PO TABS: ORAL_TABLET | ORAL | 2 refills | Status: DC

## 2021-07-10 MED ORDER — METOPROLOL TARTRATE 25 MG PO TABS
25.0000 mg | ORAL_TABLET | Freq: Two times a day (BID) | ORAL | Status: DC
  Administered 2021-07-10 – 2021-07-11 (×2): 25 mg via ORAL
  Filled 2021-07-10 (×2): qty 1

## 2021-07-10 MED ORDER — METOPROLOL TARTRATE 12.5 MG HALF TABLET
12.5000 mg | ORAL_TABLET | Freq: Once | ORAL | Status: AC
  Administered 2021-07-10: 12.5 mg via ORAL
  Filled 2021-07-10: qty 1

## 2021-07-10 MED ORDER — VENLAFAXINE HCL ER 75 MG PO CP24: 75.0000 mg | ORAL_CAPSULE | Freq: Every day | ORAL | 0 refills | Status: DC

## 2021-07-10 MED ORDER — METOPROLOL TARTRATE 25 MG PO TABS: 12.5000 mg | ORAL_TABLET | Freq: Two times a day (BID) | ORAL | 0 refills | Status: DC

## 2021-07-10 MED ORDER — FOLBIC 2.5-25-2 MG PO TABS: 1.0000 | ORAL_TABLET | Freq: Every day | ORAL | 3 refills | Status: DC

## 2021-07-10 MED ORDER — DOCUSATE SODIUM 100 MG PO CAPS: 100.0000 mg | ORAL_CAPSULE | Freq: Two times a day (BID) | ORAL | 0 refills | Status: DC

## 2021-07-10 NOTE — Progress Notes (Signed)
Patient ID: Carrie Chung, female   DOB: 09-14-1941, 80 y.o.   MRN: [id number] Pt has decided she wants a transport chair instead of a wheelchair and has send back wheelchair. Will see if [employer] has a transport chair and have let daughter know may need to get on their own if can not get in tome for discharge tomorrow.

## 2021-07-10 NOTE — Progress Notes (Signed)
Physical Therapy Discharge Summary  Patient Details  Name: Carrie Chung MRN: [id number] Date of Birth: 1941-02-11  Today's Date: 07/10/2021 PT Individual Time: 0800-0900 4193-7902 PT Individual Time Calculation (min): 28 min , 60 min   Patient has met 8 of 8 long term goals due to improved activity tolerance, improved balance, improved postural control, increased strength, increased range of motion, decreased pain, and ability to compensate for deficits.  Patient to discharge at an ambulatory level Supervision.   Patient's care partner requires assistance to provide the necessary physical assistance at discharge.  Reasons goals not met: NA  Recommendation:  Patient will benefit from ongoing skilled PT services in home health setting to continue to advance safe functional mobility, address ongoing impairments in strength, ROM, functional mobility, self efficacy, and minimize fall risk.  Equipment: RW, transport chair  Reasons for discharge: treatment goals met and discharge from hospital  Patient/family agrees with progress made and goals achieved: Yes  Skilled PT interventions:  Session 1: pt received in bed and agreeable to therapy. No complaint of pain. Nursing present for meds pass. Pt reports intense nausea throughout night and morning but willing to get up and attempt therapy. Bed mobility mod I with leg lifter and bed features. Therapist donned ace wraps to mid thigh for edema management. Pt ambulated in room distances to gather clothes and dress mod I in sitting. Oral care in standing mod I. Pt required extended time and encouragement to complete tasks 2/2 nausea. ambulatory transfer to w/c with RW mod I. Pt performed stairs with CGA-supervision and 1 handrail 4x6" steps. Pt returned to room and performed ambulatory transfer mod I with RW. Pt left in recliner with all needs in reach.  Session 2: Pt received in recliner and agreeable to therapy.  Pt reports feeling much better  than previous session. ambulatory transfer with RW mod I to w/c. Pt transported to gym for time. Pt navigated ramp and car transfer with mod I and RW. Bed mobility and furniture transfer with mod I. Gait x 150 ft with RW mod I. Pt propelled w/c with BUE back to room mod I. ambulatory transfer to recliner with RW, pt left with all needs in reach and chair alarm active.  PT Discharge Precautions/Restrictions Precautions Precautions: Fall Restrictions Weight Bearing Restrictions: Yes LLE Weight Bearing: Weight bearing as tolerated Vital Signs Therapy Vitals Temp: 99 F (37.2 C) Temp Source: Oral Pulse Rate: 89 Resp: 17 BP: 140/72 Patient Position (if appropriate): Sitting Oxygen Therapy SpO2: 97 % O2 Device: Room Air Pain Pain Assessment Pain Score: 3  Vision/Perception  Perception Perception: Within Functional Limits Praxis Praxis: Intact  Cognition Overall Cognitive Status: Within Functional Limits for tasks assessed Arousal/Alertness: Awake/alert Orientation Level: Oriented X4 Attention: Focused;Sustained Focused Attention: Appears intact Sustained Attention: Appears intact Memory: Appears intact Awareness: Appears intact Problem Solving: Appears intact Safety/Judgment: Appears intact Sensation Sensation Light Touch: Appears Intact Hot/Cold: Appears Intact Proprioception: Appears Intact Stereognosis: Appears Intact Coordination Gross Motor Movements are Fluid and Coordinated: No Coordination and Movement Description: Movement grossly uncoordinated 2/2 pain, greatly improved from discharge Motor  Motor Motor: Other (comment) (limited by pain in L hip) Motor - Discharge Observations: Coordination greatly improved from baseline  Mobility Bed Mobility Bed Mobility: Rolling Right;Supine to Sit;Sit to Supine;Sitting - Scoot to Edge of Bed Rolling Right: Supervision/verbal cueing Rolling Left: Supervision/Verbal cueing Supine to Sit: Supervision/Verbal  cueing Sitting - Scoot to Edge of Bed: Contact Guard/Touching assist;Supervision/Verbal cueing Sit to Supine: Supervision/Verbal cueing Transfers Transfers:  Sit to Stand;Stand to Sit Sit to Stand: Independent with assistive device Stand to Sit: Independent with assistive device Stand Pivot Transfers: Independent with assistive device Transfer (Assistive device): Rolling walker Locomotion  Gait Ambulation: Yes Gait Assistance: Independent with assistive device Gait Distance (Feet): 200 Feet Assistive device: Rolling walker Gait Gait: Yes Gait Pattern: Impaired Gait Pattern: Step-to pattern;Step-through pattern;Ataxic Gait velocity: reduced Stairs / Additional Locomotion Stairs: Yes Stairs Assistance: Supervision/Verbal cueing Stair Management Technique: One rail Right Number of Stairs: 4 Height of Stairs: 6 Curb: Nurse, mental health Mobility: Yes Wheelchair Assistance: Independent with Camera operator: Both upper extremities Wheelchair Parts Management: Supervision/cueing Distance: 300 ft  Trunk/Postural Assessment  Cervical Assessment Cervical Assessment: Within Functional Limits Thoracic Assessment Thoracic Assessment: Within Functional Limits Lumbar Assessment Lumbar Assessment: Within Functional Limits Postural Control Postural Control: Within Functional Limits  Balance Balance Balance Assessed: Yes Static Sitting Balance Static Sitting - Balance Support: Feet supported;Bilateral upper extremity supported Static Sitting - Level of Assistance: 6: Modified independent (Device/Increase time) Dynamic Sitting Balance Dynamic Sitting - Balance Support: Feet supported;Left upper extremity supported Dynamic Sitting - Level of Assistance: 6: Modified independent (Device/Increase time) Static Standing Balance Static Standing - Balance Support: During functional activity;Bilateral upper extremity  supported Static Standing - Level of Assistance: 6: Modified independent (Device/Increase time) Extremity Assessment      RLE Assessment RLE Assessment: Within Functional Limits RLE Strength RLE Overall Strength: Within Functional Limits for tasks assessed LLE Assessment LLE Assessment: Exceptions to [facility] General Strength Comments: Limited testing d/t pain, greatly improved from baseline    Mickel Fuchs 07/10/2021, 4:19 PM

## 2021-07-10 NOTE — Progress Notes (Signed)
PROGRESS NOTE   Subjective/Complaints:  Pt reports worried about elevated BP- has been ~ 885O-277A systolic last 2 days or so.  Since pulse/HR up to 90s usually, will increase Metoprolol to 25 mg BID and give an additional dose now.   Last night/o/n, pt c/o urinary frequency/urgency- checked U/A - was (-).  Since also had low grade temp of 100.3 will check CBC today to make sure not missing anything and check CXR.   Also, K+ is 3.5- - will start KCL 10 mEq.     Soles 'feel like cardboard"- also "fussing". About lack of yogurt.  Wants to go home with Oxy 5 mg - a few pills at d/c.  Still having intermittent nausea.    ROS:   Pt denies SOB, abd pain, CP, N/V/C/D, and vision changes   Objective:   DG Abd 1 View  Result Date: 07/09/2021 CLINICAL DATA:  New onset nausea and vomiting for 24 hours. EXAM: ABDOMEN - 1 VIEW COMPARISON:  None. FINDINGS: Portable supine view of the abdomen obtained. Air within nondilated stomach. Air within nondilated small and large bowel. No evidence of obstruction. Minimal formed stool in the colon. No visualized radiopaque calculi or abnormal soft tissue calcifications. Lumbar spine degenerative change. IMPRESSION: Nonobstructive bowel gas pattern. Electronically Signed   By: Keith Rake M.D.   On: 07/09/2021 15:09   No results for input(s): WBC, HGB, HCT, PLT in the last 72 hours.    Recent Labs    07/10/21 0626  NA 142  K 3.5  CL 103  CO2 29  GLUCOSE 132*  BUN <5*  CREATININE 0.54  CALCIUM 9.0      Intake/Output Summary (Last 24 hours) at 07/10/2021 0915 Last data filed at 07/10/2021 0736 Gross per 24 hour  Intake 480 ml  Output --  Net 480 ml        Physical Exam: Vital Signs Blood pressure (!) 150/75, pulse 91, temperature 98.5 F (36.9 C), resp. rate 16, height 5\' 1"  (1.549 m), weight 73.2 kg, SpO2 95 %.      General: awake, alert, appropriate, sitting up in  bed- frustrated about things overall this AM;  NAD HENT: conjugate gaze; oropharynx moist CV: regular rate but on higher side; no JVD Pulmonary: CTA B/L; no W/R/R- good air movement GI: soft, NT, ND, (+)BS; normoactive Psychiatric: appropriate; a little grumpy- per pt.  Neurological: alert  Extremities: No clubbing, cyanosis, or edema Skin: No evidence of breakdown, no evidence of rash ral upper and lower extremities Musculoskeletal: reduced AROM left hip   Skin: Edema after ACE wraps much improved- L knee is down to 2+ from 3+ and L ankle is 1+ down from 2+. - is slightly improved again today.   - Edema and worse right at L hip due to positioning with L hip/buttock at base of "V"- looks the same today-very slightly less to stable swelling- 2+ LLE ankle and 1+ on R ankle- also swelling around L knee/medially - swelling spreading out due ot gravity Neurological:    Comments: Patient is alert.  No acute distress.  Oriented x3 and follows commands. Strength intact except pain limited in LLE.   Assessment/Plan:  1. Functional deficits which require 3+ hours per day of interdisciplinary therapy in a comprehensive inpatient rehab setting. Physiatrist is providing close team supervision and 24 hour management of active medical problems listed below. Physiatrist and rehab team continue to assess barriers to discharge/monitor patient progress toward functional and medical goals  Care Tool:  Bathing    Body parts bathed by patient: Front perineal area, Face, Buttocks   Body parts bathed by helper: Buttocks, Right lower leg, Left lower leg, Front perineal area     Bathing assist Assist Level: Set up assist     Upper Body Dressing/Undressing Upper body dressing   What is the patient wearing?: Pull over shirt, Bra    Upper body assist Assist Level: Set up assist    Lower Body Dressing/Undressing Lower body dressing      What is the patient wearing?: Pants, Underwear/pull up      Lower body assist Assist for lower body dressing: Moderate Assistance - Patient 50 - 74% (no AE)     Toileting Toileting Toileting Activity did not occur Landscape architect and hygiene only):  (Patient voided in brief and had stool)  Toileting assist Assist for toileting: Moderate Assistance - Patient 50 - 74%     Transfers Chair/bed transfer  Transfers assist  Chair/bed transfer activity did not occur: Safety/medical concerns  Chair/bed transfer assist level: Supervision/Verbal cueing     Locomotion Ambulation   Ambulation assist   Ambulation activity did not occur: Safety/medical concerns  Assist level: Supervision/Verbal cueing Assistive device: Walker-rolling Max distance: 180ft   Walk 10 feet activity   Assist  Walk 10 feet activity did not occur: Safety/medical concerns  Assist level: Supervision/Verbal cueing Assistive device: Walker-rolling   Walk 50 feet activity   Assist Walk 50 feet with 2 turns activity did not occur: Safety/medical concerns  Assist level: Supervision/Verbal cueing Assistive device: Walker-rolling    Walk 150 feet activity   Assist Walk 150 feet activity did not occur: Safety/medical concerns  Assist level: Supervision/Verbal cueing Assistive device: Walker-rolling    Walk 10 feet on uneven surface  activity   Assist Walk 10 feet on uneven surfaces activity did not occur: Safety/medical concerns   Assist level: Supervision/Verbal cueing     Wheelchair     Assist Will patient use wheelchair at discharge?: Yes Type of Wheelchair: Manual Wheelchair activity did not occur: Safety/medical concerns  Wheelchair assist level: Set up assist Max wheelchair distance: 150 ft    Wheelchair 50 feet with 2 turns activity    Assist    Wheelchair 50 feet with 2 turns activity did not occur: Safety/medical concerns   Assist Level: Independent   Wheelchair 150 feet activity     Assist  Wheelchair 150 feet  activity did not occur: Safety/medical concerns   Assist Level: Independent   Blood pressure (!) 150/75, pulse 91, temperature 98.5 F (36.9 C), resp. rate 16, height 5\' 1"  (1.549 m), weight 73.2 kg, SpO2 95 %.    Medical Problem List and Plan: 1.   Debility secondary to left femoral intertrochanteric hip fracture.  Status post IM nailing 06/20/2021.  Weightbearing as tolerated             -patient may shower but incision must be covered             -ELOS/Goals: 2-3 weeks modI    -con't PT and OT- will move d/c date to 7/22 to help family and let pt get a little better- husband has dementia  and he will keep asking pt to do things and she's just not good enough at this time- only has intermittent help at home.   -con't PT and OT- d/c tomorrow- WBAT 2.  Impaired mobility: continue SCDs.               -antiplatelet therapy: continue Aspirin 81 mg daily and Plavix 75 mg daily 3. Post-operative left hip pain: Continue Oxycodone and Robaxin as needed. History of negative side effects to gabapentin. Placed order to request that oxycodone be administered 30 minutes prior to therapy sessions  7/12- will have pt take 10 mg Oxy q4 hours prn- she had been trying to avoid taking unless had to- explained that she will do better in therapy if pain better controlled- will get off meds- I promise, but needs for now.   77/19- pain OK with decrease in Oxy to 5 mg q4 hours prn- cannot use Tramadol due to prolonged QT.  7/20- if tolerates still, will see if can change to [facility] tomorrow  7/21- pt said "norco doesn't work her her"- so will send home on 7 days of Oxycodone , but isn't taking regularly.  4. Anxiety: Continue Effexor 75 mg daily, Xanax 1 mg- changed to scheduled. Discussed risk of respiratory depression with concurrent use of Xanax and oxycodone- she has been tolerating both together.              -antipsychotic agents: N/A 5. Neuropsych: This patient is capable of making decisions on her own  behalf. 6. Skin/Wound Care: Routine skin checks 7. Fluids/Electrolytes/Nutrition: Routine in and outs with follow-up chemistries 8.  Acute blood loss anemia.  CBC stable. Continue to monitor weekly.   7/13- Hb stable at 8.5- con't to monitor weekly. 7/14- Hb 8.0- down slightly from 7/6, however says has been drinking more- can hemodilute. 7/17- labs in AM  7/19- Hb 8.4- stable- con't regimen  9.  CAD with history of multiple stents.  Continue aspirin Plavix 10.  Hyperlipidemia.  Continue Crestor 11.  Nocturnal oxygen requirements.  Continue oxygen as prior to admission  7/14- O2 drops at night when doesn't wear O2- encouraged to wear at night.  12.  Hypertension.  Well controlled, decrease lopressor to 12.5mg  BID.  Monitor with increased mobility  7/12- BP well controlled- however having dizziness- see #14 for more.-con't regimen for now.   7/13- orthostatics looked good- didn't drop, except pulse got very slightly more- will con't to monitor  7/14- BP went up with AM orthostatics and pulse very slightly more, but not significant- con't to monitor  7/15- no dizziness in last 24 hours- con't to monitor  7/21- will increase Metoprolol to 25 mg BID and give 1 12.5 mg dose this AM to make sure OK.  13.  Constipation.  Continue laxative assistance as directed, add mylicon for gas  8/24- increased dose to 160 mg QID prn 14. Lightheaded  7/12- will order Orthostatics to be done- see if it's (+)- if so, will decrease Lopressor- also have pt drink more- she's only drinking 4 cups/water daily- will increase to at least 6 cups/day.   7/13- Cr looks OK last week- will recheck labs in AM  7/14- Cr stable at 0.57 and BUN 8  7/19- Cr 0.56- con't regimen Orthostatic vitals negative  15. LLE edema  Mostly proximal lateral hip   717 - encouraged pt to wear ACE wrap of LLE from ties to mid thigh until dinner time. Pt states it felt good when it  was on but had some pain on removal - edema mild to moverate  prox hip   77/21- LE edema is improving- con't ACE wraps 16. Hypokalemia  7/19- will replete with 40 mEq x2 and recheck Thursday 17. Nausea  7/20- no vomiting- anxiety? Not constipated based on KUB- will change Zofran to Compazine prn- cannot use phenergan due to QT prolongation.   7/21- still going on- (-) for UTI; will check CXR and CBC since had low grade fever of 100.3.  16. Dispo  7/15- will extend to 7/22. 7/21- d/c tomorrow    LOS: 16 days A FACE TO FACE EVALUATION WAS PERFORMED  Boruch Manuele 07/10/2021, 9:15 AM

## 2021-07-10 NOTE — Progress Notes (Signed)
Inpatient Rehabilitation Care Coordinator Discharge Note  The overall goal for the admission was met for:   Discharge location: [place] CAREGIVER 8 HR PER DAY  Length of Stay: Yes-17 DAYS  Discharge activity level: Yes-MOD/I-SUPERVISION LEVEL  Home/community participation: Yes  Services provided included: MD, RD, PT, OT, RN, CM, Pharmacy, and SW  Financial Services: Medicare and Private Insurance: [employer] offered to/list presented to:PT AND DAUGHTER  Follow-up services arranged: Home Health: [facility], Outpatient: [place], DME: [facility], 3 IN 1 , TRANSPORT CHAIR AND WILL GET TUB BENCH ON OWN, and Patient/Family has no preference for HH/DME agencies  Comments (or additional information):PT DID WELL AND REACHED HER GOALS, WITH EXTRA TWO DAYS SHE [place].  Patient/Family verbalized understanding of follow-up arrangements: Yes  Individual responsible for coordination of the follow-up plan: [facility] [phone]  Confirmed correct DME delivered: Elease Hashimoto 07/10/2021    Aradia Estey, [place]

## 2021-07-10 NOTE — Progress Notes (Signed)
Occupational Therapy Discharge Summary  Patient Details  Name: Carrie Chung MRN: [id number] Date of Birth: [birth year]   Patient has met 9 of 9 long term goals due to improved activity tolerance, improved balance, postural control, ability to compensate for deficits, functional use of  LEFT lower extremity, and improved coordination.  Patient to discharge at overall Modified Independent to occasional Supervision level.  Patient's care partner is independent to provide the necessary physical assistance at discharge.  The pt has shown significant improvement with OT services and ADL skill performance, Mod I with toileting and ambulatory transfers to various surfaces including bed, chair, shower bench, toilet, etc. Pt needs Supervision/Set up for bathing at shower level d/t wet surfaces and comfort level/anxiety. Pt is having in home aide for 8 hours daily to assist with BADLs in morning for comfort and other IADL tasks. Pt's daughter lives in the area as well to assist PRN. Pt will good safety awareness, adherence to safety tips, and ready for DC home.   Reasons goals not met: NA  Recommendation:  Patient will benefit from ongoing skilled OT services in  NA d/t progress and CLOF     Equipment: Daughter purchased TTB  Reasons for discharge: treatment goals met and discharge from hospital  Patient/family agrees with progress made and goals achieved: Yes  OT Discharge Precautions/Restrictions  Precautions Precautions: Fall Restrictions Weight Bearing Restrictions: Yes LLE Weight Bearing: Weight bearing as tolerated Vital Signs Therapy Vitals Temp: 99 F (37.2 C) Temp Source: Oral Pulse Rate: 89 Resp: 17 BP: 140/72 Patient Position (if appropriate): Sitting Oxygen Therapy SpO2: 97 % O2 Device: Room Air Pain Pain Assessment Pain Scale: 0-10 Pain Score: 3  ADL ADL Equipment Provided: Leg lifter Eating: Independent Grooming: Modified independent Upper Body Bathing:  Setup Lower Body Bathing: Supervision/safety Upper Body Dressing: Modified independent (Device) Lower Body Dressing: Setup Toileting: Modified independent Toilet Transfer: Modified independent Toilet Transfer Method: Ambulating Tub/Shower Transfer: Distant supervision Vision Baseline Vision/History: No visual deficits Patient Visual Report: No change from baseline Perception  Perception: Within Functional Limits Praxis Praxis: Intact Cognition Overall Cognitive Status: Within Functional Limits for tasks assessed Arousal/Alertness: Awake/alert Orientation Level: Oriented X4 Memory: Appears intact Awareness: Appears intact Problem Solving: Appears intact Safety/Judgment: Appears intact Sensation Sensation Light Touch: Appears Intact Hot/Cold: Appears Intact Proprioception: Appears Intact Stereognosis: Appears Intact Coordination Gross Motor Movements are Fluid and Coordinated: No Fine Motor Movements are Fluid and Coordinated: Yes Coordination and Movement Description: Movement grossly uncoordinated 2/2 pain, greatly improved from eval Motor  Motor Motor: Other (comment) (limited by L hip discomfort/stiffness) Motor - Discharge Observations: Coordination greatly improved from baseline Mobility  Bed Mobility Rolling Left: Supervision/Verbal cueing Supine to Sit: Supervision/Verbal cueing Sitting - Scoot to Edge of Bed: Supervision/Verbal cueing Sit to Supine: Supervision/Verbal cueing Transfers Sit to Stand: Independent with assistive device Stand to Sit: Independent with assistive device  Trunk/Postural Assessment  Cervical Assessment Cervical Assessment: Within Functional Limits Thoracic Assessment Thoracic Assessment: Within Functional Limits Lumbar Assessment Lumbar Assessment: Within Functional Limits Postural Control Postural Control: Within Functional Limits  Balance Balance Balance Assessed: Yes Static Sitting Balance Static Sitting - Level of  Assistance: 6: Modified independent (Device/Increase time) Dynamic Sitting Balance Dynamic Sitting - Balance Support: Feet supported;Left upper extremity supported Dynamic Sitting - Level of Assistance: 6: Modified independent (Device/Increase time) Dynamic Sitting - Balance Activities: Lateral lean/weight shifting;Forward lean/weight shifting Static Standing Balance Static Standing - Balance Support: During functional activity;Bilateral upper extremity supported Extremity/Trunk Assessment RUE Assessment RUE Assessment: Within  Functional Limits LUE Assessment LUE Assessment: Within Functional Limits   Viona Gilmore 07/10/2021, 4:44 PM

## 2021-07-10 NOTE — Progress Notes (Signed)
Occupational Therapy Session Note  Patient Details  Name: Carrie Chung MRN: [id number] Date of Birth: 13-Mar-1941  Today's Date: 07/10/2021 OT Individual Time: 1696-7893 and 8101-7510 and 2585-2778 OT Individual Time Calculation (min): 58 min and 28 min and 33 min   Short Term Goals: Week 1:  OT Short Term Goal 1 (Week 1): Pt will perform BSC/toilet transfer with LRAD MIN A OT Short Term Goal 1 - Progress (Week 1): Met OT Short Term Goal 2 (Week 1): Pt will perform LB dressing sit <> stands level Mod A OT Short Term Goal 2 - Progress (Week 1): Met OT Short Term Goal 3 (Week 1): Pt will perform UB/LB dress with AE PRN with Mod A OT Short Term Goal 3 - Progress (Week 1): Met Week 2:  OT Short Term Goal 1 (Week 2): STGs = LTGs d/t ELOS   Skilled Therapeutic Interventions/Progress Updates:    Session 1: Pt greeted at time of session sitting up in wheelchair agreeable to OT session, aware of grad day. Discussing with pt regarding taking a shower this morning, hesitant because she had just gotten dressed in previous session. While discussing that we can doff clothing and put on clean clothes again after shower, Xray technician entered room and performed chest xray. D/t limited time, changed session to focus on ADL transfers and DC planning. Self propel wheelchair room <> tub room with Mod I (declined walking d/t pain and RN aware, provided tylenol partially through session), TTB transfer with Mod I after therapist demonstration. Daughter has purchased sliding tub/shoewr bench that can also be converted to shower seat and will work for the pt. When back in room, DME delivered manual wheelchair and pt declining, wanting a transport chair and messaged SW asking if possible to order alternative. Stand pivot wheelchair > recliner Mod I and alarm on call bell in reach.    Session 2: Pt greeted at time of session sitting up in recliner agreeable to OT session, no pain reported and did not impact  treatment. Ambulated chair <> toilet with Mod I, performed toileting tasks same manner before walking to sink for hand hygiene > recliner all Mod I. No cues or steadying required. Pt able to stand without UE support at one point for bimanual task as well without fear of falling which is an improvement. Simulated LB dressing activity with gait belt, Mod I and did discuss use of reacher for tighter underwear. Pt up in recliner all needs met call bell in reach.    Session 3: Pt greeted at time of session sitting up in recliner agreeable to OT session, 0/10 pain resting and 3/10 in standing which did not affect performance. Focus of session on DC planning wrapping up last minute questions, ensuring no questions regarding DME, having aide at home for 8 hours/day, etc. Reviewed standing stretches for hip flexors as well as LLE circles to perform when tight/sitting for longer periods. Provided hand out as well for hip kit, items needed, where to purchase, etc. Pt up in chair all needs met call bell in reach.    Therapy Documentation Precautions:  Precautions Precautions: Fall Restrictions Weight Bearing Restrictions: Yes LLE Weight Bearing: Weight bearing as tolerated     Therapy/Group: Individual Therapy  Viona Gilmore 07/10/2021, 7:21 AM

## 2021-07-11 LAB — URINE CULTURE
Culture: <10000 — AB
Special Requests: NORMAL

## 2021-07-11 MED ORDER — PROCHLORPERAZINE MALEATE 5 MG PO TABS: 5.0000 mg | ORAL_TABLET | Freq: Four times a day (QID) | ORAL | 0 refills | Status: DC | PRN

## 2021-07-11 MED ORDER — METOPROLOL TARTRATE 25 MG PO TABS: 25.0000 mg | ORAL_TABLET | Freq: Two times a day (BID) | ORAL | 0 refills | Status: DC

## 2021-07-11 NOTE — Progress Notes (Signed)
PA Dan in with pt/family to discuss discharge instructions. Pt/family in agreement. Belongings gathered. Pt left per wheelchair to private vehicle. No complications noted. Sheela Stack, LPN

## 2021-07-11 NOTE — Progress Notes (Signed)
PROGRESS NOTE   Subjective/Complaints:  Pt reports she's concerned about BP- excplained it's coming down with changing Metoprolol back to home dosage.  Agrees now that nausea is anxiety based. CXR and labs look good.   She thinks hematoma is causing L hip swelling.  Gas -X helped nausea and gas yesterday .   ROS:   Pt denies SOB, abd pain, CP, C/D, and vision changes    Objective:   DG Abd 1 View  Result Date: 07/09/2021 CLINICAL DATA:  New onset nausea and vomiting for 24 hours. EXAM: ABDOMEN - 1 VIEW COMPARISON:  None. FINDINGS: Portable supine view of the abdomen obtained. Air within nondilated stomach. Air within nondilated small and large bowel. No evidence of obstruction. Minimal formed stool in the colon. No visualized radiopaque calculi or abnormal soft tissue calcifications. Lumbar spine degenerative change. IMPRESSION: Nonobstructive bowel gas pattern. Electronically Signed   By: Keith Rake M.D.   On: 07/09/2021 15:09   DG CHEST PORT 1 VIEW  Result Date: 07/10/2021 CLINICAL DATA:  Fever, chills. EXAM: PORTABLE CHEST 1 VIEW COMPARISON:  June 19, 2021. FINDINGS: The heart size and mediastinal contours are within normal limits. Both lungs are clear. The visualized skeletal structures are unremarkable. IMPRESSION: No active disease. Electronically Signed   By: Marijo Conception M.D.   On: 07/10/2021 11:24   Recent Labs    07/10/21 0626  WBC 5.3  HGB 9.3*  HCT 29.5*  PLT 246      Recent Labs    07/10/21 0626  NA 142  K 3.5  CL 103  CO2 29  GLUCOSE 132*  BUN <5*  CREATININE 0.54  CALCIUM 9.0      Intake/Output Summary (Last 24 hours) at 07/11/2021 [id number] Last data filed at 07/10/2021 2212 Gross per 24 hour  Intake 477 ml  Output --  Net 477 ml        Physical Exam: Vital Signs Blood pressure (!) 148/78, pulse 95, temperature 98.6 F (37 C), resp. rate 18, height '5\' 1"'$  (1.549 m), weight 73.2  kg, SpO2 94 %.       General: awake, alert, appropriate, laying in bed;  NAD HENT: conjugate gaze; oropharynx moist CV: regular rate; no JVD Pulmonary: CTA B/L; no W/R/R- good air movement GI: soft, NT, (+)BS- somewhat distended from gas? Psychiatric: appropriate Neurological: Ox3  Extremities: No clubbing, cyanosis, or edema Skin: No evidence of breakdown, no evidence of rash ral upper and lower extremities Musculoskeletal: reduced AROM left hip   Skin: Edema after ACE wraps much improved- L knee is down to 2+ from 3+ and L ankle is 1+ down from 2+. - is slightly improved again today.  Edema looks stab;e today in LLE - Edema and worse right at L hip due to positioning with L hip/buttock at base of "V"- looks the same today-very slightly less to stable swelling- 2+ LLE ankle and 1+ on R ankle- also swelling around L knee/medially - swelling spreading out due ot gravity Neurological:    Comments: Patient is alert.  No acute distress.  Oriented x3 and follows commands. Strength intact except pain limited in LLE.   Assessment/Plan: 1. Functional deficits  which require 3+ hours per day of interdisciplinary therapy in a comprehensive inpatient rehab setting. Physiatrist is providing close team supervision and 24 hour management of active medical problems listed below. Physiatrist and rehab team continue to assess barriers to discharge/monitor patient progress toward functional and medical goals  Care Tool:  Bathing    Body parts bathed by patient: Right arm, Left arm, Chest, Abdomen, Front perineal area, Buttocks, Right upper leg, Left upper leg, Right lower leg, Left lower leg, Face   Body parts bathed by helper: Buttocks, Right lower leg, Left lower leg, Front perineal area     Bathing assist Assist Level: Supervision/Verbal cueing     Upper Body Dressing/Undressing Upper body dressing   What is the patient wearing?: Pull over shirt, Bra    Upper body assist Assist Level:  Set up assist    Lower Body Dressing/Undressing Lower body dressing      What is the patient wearing?: Pants, Underwear/pull up     Lower body assist Assist for lower body dressing: Set up assist (w/ compensatory techniques and AE)     Toileting Toileting Toileting Activity did not occur (Clothing management and hygiene only):  (Patient voided in brief and had stool)  Toileting assist Assist for toileting: Independent with assistive device     Transfers Chair/bed transfer  Transfers assist  Chair/bed transfer activity did not occur: Safety/medical concerns  Chair/bed transfer assist level: Independent with assistive device Chair/bed transfer assistive device: Programmer, multimedia   Ambulation assist   Ambulation activity did not occur: Safety/medical concerns  Assist level: Supervision/Verbal cueing Assistive device: Walker-rolling Max distance: 200 ft   Walk 10 feet activity   Assist  Walk 10 feet activity did not occur: Safety/medical concerns  Assist level: Independent with assistive device Assistive device: Walker-rolling   Walk 50 feet activity   Assist Walk 50 feet with 2 turns activity did not occur: Safety/medical concerns  Assist level: Independent with assistive device Assistive device: Walker-rolling    Walk 150 feet activity   Assist Walk 150 feet activity did not occur: Safety/medical concerns  Assist level: Supervision/Verbal cueing Assistive device: Walker-rolling    Walk 10 feet on uneven surface  activity   Assist Walk 10 feet on uneven surfaces activity did not occur: Safety/medical concerns   Assist level: Independent with assistive device Assistive device: Aeronautical engineer Will patient use wheelchair at discharge?: Yes Type of Wheelchair: Manual Wheelchair activity did not occur: Safety/medical concerns  Wheelchair assist level: Independent Max wheelchair distance: 300+     Wheelchair 50 feet with 2 turns activity    Assist    Wheelchair 50 feet with 2 turns activity did not occur: Safety/medical concerns   Assist Level: Independent   Wheelchair 150 feet activity     Assist  Wheelchair 150 feet activity did not occur: Safety/medical concerns   Assist Level: Independent   Blood pressure (!) 148/78, pulse 95, temperature 98.6 F (37 C), resp. rate 18, height '5\' 1"'$  (1.549 m), weight 73.2 kg, SpO2 94 %.    Medical Problem List and Plan: 1.   Debility secondary to left femoral intertrochanteric hip fracture.  Status post IM nailing 06/20/2021.  Weightbearing as tolerated             -patient may shower but incision must be covered             -ELOS/Goals: 2-3 weeks modI    -con't PT and  OT- will move d/c date to 7/22 to help family and let pt get a little better- husband has dementia and he will keep asking pt to do things and she's just not good enough at this time- only has intermittent help at home.   -con't PT and OT- d/c tomorrow- WBAT  D/c today 2.  Impaired mobility: continue SCDs.               -antiplatelet therapy: continue Aspirin 81 mg daily and Plavix 75 mg daily 3. Post-operative left hip pain: Continue Oxycodone and Robaxin as needed. History of negative side effects to gabapentin. Placed order to request that oxycodone be administered 30 minutes prior to therapy sessions  7/12- will have pt take 10 mg Oxy q4 hours prn- she had been trying to avoid taking unless had to- explained that she will do better in therapy if pain better controlled- will get off meds- I promise, but needs for now.   77/19- pain OK with decrease in Oxy to 5 mg q4 hours prn- cannot use Tramadol due to prolonged QT.  7/20- if tolerates still, will see if can change to [facility] tomorrow  7/21- pt said "norco doesn't work her her"- so will send home on 7 days of Oxycodone , but isn't taking regularly.  4. Anxiety: Continue Effexor 75 mg daily, Xanax 1 mg- changed  to scheduled. Discussed risk of respiratory depression with concurrent use of Xanax and oxycodone- she has been tolerating both together.              -antipsychotic agents: N/A 5. Neuropsych: This patient is capable of making decisions on her own behalf. 6. Skin/Wound Care: Routine skin checks 7. Fluids/Electrolytes/Nutrition: Routine in and outs with follow-up chemistries 8.  Acute blood loss anemia.  CBC stable. Continue to monitor weekly.   7/13- Hb stable at 8.5- con't to monitor weekly. 7/14- Hb 8.0- down slightly from 7/6, however says has been drinking more- can hemodilute. 7/17- labs in AM  7/19- Hb 8.4- stable- con't regimen  9.  CAD with history of multiple stents.  Continue aspirin Plavix 10.  Hyperlipidemia.  Continue Crestor 11.  Nocturnal oxygen requirements.  Continue oxygen as prior to admission  7/14- O2 drops at night when doesn't wear O2- encouraged to wear at night.  12.  Hypertension.  Well controlled, decrease lopressor to 12.'5mg'$  BID.  Monitor with increased mobility  7/12- BP well controlled- however having dizziness- see #14 for more.-con't regimen for now.   7/13- orthostatics looked good- didn't drop, except pulse got very slightly more- will con't to monitor  7/14- BP went up with AM orthostatics and pulse very slightly more, but not significant- con't to monitor  7/15- no dizziness in last 24 hours- con't to monitor  7/21- will increase Metoprolol to 25 mg BID and give 1 12.5 mg dose this AM to make sure OK.  13.  Constipation.  Continue laxative assistance as directed, add mylicon for gas  [id number]- increased dose to 160 mg QID prn 14. Lightheaded  7/12- will order Orthostatics to be done- see if it's (+)- if so, will decrease Lopressor- also have pt drink more- she's only drinking 4 cups/water daily- will increase to at least 6 cups/day.   7/13- Cr looks OK last week- will recheck labs in AM  7/14- Cr stable at 0.57 and BUN 8  7/19- Cr 0.56- con't  regimen Orthostatic vitals negative  15. LLE edema  Mostly proximal lateral hip   717 -  encouraged pt to wear ACE wrap of LLE from ties to mid thigh until dinner time. Pt states it felt good when it was on but had some pain on removal - edema mild to moverate prox hip   77/21- LE edema is improving- con't ACE wraps 16. Hypokalemia  7/19- will replete with 40 mEq x2 and recheck Thursday 17. Nausea  7/20- no vomiting- anxiety? Not constipated based on KUB- will change Zofran to Compazine prn- cannot use phenergan due to QT prolongation.   7/21- still going on- (-) for UTI; will check CXR and CBC since had low grade fever of 100.3.  16. Dispo  7/15- will extend to 7/22. 7/21- d/c tomorrow  7/22- will d/c with Oxycodone and Compazine prn Rx's- also BP doing better since increased Metoprolol back to home dose (was having soft BP initially)- 25 mg BID is d/c dose.     LOS: 17 days A FACE TO FACE EVALUATION WAS PERFORMED  Carrie Chung 07/11/2021, 8:52 AM

## 2021-07-12 DIAGNOSIS — G3184 Mild cognitive impairment, so stated: Secondary | ICD-10-CM | POA: Diagnosis not present

## 2021-07-12 DIAGNOSIS — Z9981 Dependence on supplemental oxygen: Secondary | ICD-10-CM | POA: Diagnosis not present

## 2021-07-12 DIAGNOSIS — E876 Hypokalemia: Secondary | ICD-10-CM | POA: Diagnosis not present

## 2021-07-12 DIAGNOSIS — F418 Other specified anxiety disorders: Secondary | ICD-10-CM | POA: Diagnosis not present

## 2021-07-12 DIAGNOSIS — I10 Essential (primary) hypertension: Secondary | ICD-10-CM | POA: Diagnosis not present

## 2021-07-12 DIAGNOSIS — Z87891 Personal history of nicotine dependence: Secondary | ICD-10-CM | POA: Diagnosis not present

## 2021-07-12 DIAGNOSIS — Z96642 Presence of left artificial hip joint: Secondary | ICD-10-CM | POA: Diagnosis not present

## 2021-07-12 DIAGNOSIS — I2510 Atherosclerotic heart disease of native coronary artery without angina pectoris: Secondary | ICD-10-CM | POA: Diagnosis not present

## 2021-07-12 DIAGNOSIS — J9611 Chronic respiratory failure with hypoxia: Secondary | ICD-10-CM | POA: Diagnosis not present

## 2021-07-12 DIAGNOSIS — R000 Tachycardia, unspecified: Secondary | ICD-10-CM | POA: Diagnosis not present

## 2021-07-12 DIAGNOSIS — Z8614 Personal history of Methicillin resistant Staphylococcus aureus infection: Secondary | ICD-10-CM | POA: Diagnosis not present

## 2021-07-12 DIAGNOSIS — Z7902 Long term (current) use of antithrombotics/antiplatelets: Secondary | ICD-10-CM | POA: Diagnosis not present

## 2021-07-12 DIAGNOSIS — S72142D Displaced intertrochanteric fracture of left femur, subsequent encounter for closed fracture with routine healing: Secondary | ICD-10-CM | POA: Diagnosis not present

## 2021-07-15 DIAGNOSIS — I2510 Atherosclerotic heart disease of native coronary artery without angina pectoris: Secondary | ICD-10-CM | POA: Diagnosis not present

## 2021-07-15 DIAGNOSIS — I10 Essential (primary) hypertension: Secondary | ICD-10-CM | POA: Diagnosis not present

## 2021-07-15 DIAGNOSIS — R000 Tachycardia, unspecified: Secondary | ICD-10-CM | POA: Diagnosis not present

## 2021-07-15 DIAGNOSIS — J9611 Chronic respiratory failure with hypoxia: Secondary | ICD-10-CM | POA: Diagnosis not present

## 2021-07-15 DIAGNOSIS — S72142D Displaced intertrochanteric fracture of left femur, subsequent encounter for closed fracture with routine healing: Secondary | ICD-10-CM | POA: Diagnosis not present

## 2021-07-15 DIAGNOSIS — E876 Hypokalemia: Secondary | ICD-10-CM | POA: Diagnosis not present

## 2021-07-16 DIAGNOSIS — S72142A Displaced intertrochanteric fracture of left femur, initial encounter for closed fracture: Secondary | ICD-10-CM | POA: Diagnosis not present

## 2021-07-18 DIAGNOSIS — I2510 Atherosclerotic heart disease of native coronary artery without angina pectoris: Secondary | ICD-10-CM | POA: Diagnosis not present

## 2021-07-18 DIAGNOSIS — J9611 Chronic respiratory failure with hypoxia: Secondary | ICD-10-CM | POA: Diagnosis not present

## 2021-07-18 DIAGNOSIS — E876 Hypokalemia: Secondary | ICD-10-CM | POA: Diagnosis not present

## 2021-07-18 DIAGNOSIS — I10 Essential (primary) hypertension: Secondary | ICD-10-CM | POA: Diagnosis not present

## 2021-07-18 DIAGNOSIS — R000 Tachycardia, unspecified: Secondary | ICD-10-CM | POA: Diagnosis not present

## 2021-07-18 DIAGNOSIS — S72142D Displaced intertrochanteric fracture of left femur, subsequent encounter for closed fracture with routine healing: Secondary | ICD-10-CM | POA: Diagnosis not present

## 2021-07-22 DIAGNOSIS — I10 Essential (primary) hypertension: Secondary | ICD-10-CM | POA: Diagnosis not present

## 2021-07-22 DIAGNOSIS — E876 Hypokalemia: Secondary | ICD-10-CM | POA: Diagnosis not present

## 2021-07-22 DIAGNOSIS — J9611 Chronic respiratory failure with hypoxia: Secondary | ICD-10-CM | POA: Diagnosis not present

## 2021-07-22 DIAGNOSIS — I2510 Atherosclerotic heart disease of native coronary artery without angina pectoris: Secondary | ICD-10-CM | POA: Diagnosis not present

## 2021-07-22 DIAGNOSIS — S72142D Displaced intertrochanteric fracture of left femur, subsequent encounter for closed fracture with routine healing: Secondary | ICD-10-CM | POA: Diagnosis not present

## 2021-07-22 DIAGNOSIS — R000 Tachycardia, unspecified: Secondary | ICD-10-CM | POA: Diagnosis not present

## 2021-07-23 DIAGNOSIS — J9611 Chronic respiratory failure with hypoxia: Secondary | ICD-10-CM | POA: Diagnosis not present

## 2021-07-23 DIAGNOSIS — R000 Tachycardia, unspecified: Secondary | ICD-10-CM | POA: Diagnosis not present

## 2021-07-23 DIAGNOSIS — I10 Essential (primary) hypertension: Secondary | ICD-10-CM | POA: Diagnosis not present

## 2021-07-23 DIAGNOSIS — S72142D Displaced intertrochanteric fracture of left femur, subsequent encounter for closed fracture with routine healing: Secondary | ICD-10-CM | POA: Diagnosis not present

## 2021-07-23 DIAGNOSIS — E876 Hypokalemia: Secondary | ICD-10-CM | POA: Diagnosis not present

## 2021-07-23 DIAGNOSIS — I2510 Atherosclerotic heart disease of native coronary artery without angina pectoris: Secondary | ICD-10-CM | POA: Diagnosis not present

## 2021-07-25 DIAGNOSIS — J9611 Chronic respiratory failure with hypoxia: Secondary | ICD-10-CM | POA: Diagnosis not present

## 2021-07-25 DIAGNOSIS — I10 Essential (primary) hypertension: Secondary | ICD-10-CM | POA: Diagnosis not present

## 2021-07-25 DIAGNOSIS — S72142D Displaced intertrochanteric fracture of left femur, subsequent encounter for closed fracture with routine healing: Secondary | ICD-10-CM | POA: Diagnosis not present

## 2021-07-25 DIAGNOSIS — R000 Tachycardia, unspecified: Secondary | ICD-10-CM | POA: Diagnosis not present

## 2021-07-25 DIAGNOSIS — E876 Hypokalemia: Secondary | ICD-10-CM | POA: Diagnosis not present

## 2021-07-25 DIAGNOSIS — I2510 Atherosclerotic heart disease of native coronary artery without angina pectoris: Secondary | ICD-10-CM | POA: Diagnosis not present

## 2021-07-28 DIAGNOSIS — I2510 Atherosclerotic heart disease of native coronary artery without angina pectoris: Secondary | ICD-10-CM | POA: Diagnosis not present

## 2021-07-28 DIAGNOSIS — I10 Essential (primary) hypertension: Secondary | ICD-10-CM | POA: Diagnosis not present

## 2021-07-28 DIAGNOSIS — S72142D Displaced intertrochanteric fracture of left femur, subsequent encounter for closed fracture with routine healing: Secondary | ICD-10-CM | POA: Diagnosis not present

## 2021-07-28 DIAGNOSIS — R000 Tachycardia, unspecified: Secondary | ICD-10-CM | POA: Diagnosis not present

## 2021-07-28 DIAGNOSIS — E876 Hypokalemia: Secondary | ICD-10-CM | POA: Diagnosis not present

## 2021-07-28 DIAGNOSIS — J9611 Chronic respiratory failure with hypoxia: Secondary | ICD-10-CM | POA: Diagnosis not present

## 2021-07-30 DIAGNOSIS — J9611 Chronic respiratory failure with hypoxia: Secondary | ICD-10-CM | POA: Diagnosis not present

## 2021-07-30 DIAGNOSIS — R000 Tachycardia, unspecified: Secondary | ICD-10-CM | POA: Diagnosis not present

## 2021-07-30 DIAGNOSIS — S72142D Displaced intertrochanteric fracture of left femur, subsequent encounter for closed fracture with routine healing: Secondary | ICD-10-CM | POA: Diagnosis not present

## 2021-07-30 DIAGNOSIS — I10 Essential (primary) hypertension: Secondary | ICD-10-CM | POA: Diagnosis not present

## 2021-07-30 DIAGNOSIS — E876 Hypokalemia: Secondary | ICD-10-CM | POA: Diagnosis not present

## 2021-07-30 DIAGNOSIS — I2510 Atherosclerotic heart disease of native coronary artery without angina pectoris: Secondary | ICD-10-CM | POA: Diagnosis not present

## 2021-08-01 DIAGNOSIS — E876 Hypokalemia: Secondary | ICD-10-CM | POA: Diagnosis not present

## 2021-08-01 DIAGNOSIS — J9611 Chronic respiratory failure with hypoxia: Secondary | ICD-10-CM | POA: Diagnosis not present

## 2021-08-01 DIAGNOSIS — R000 Tachycardia, unspecified: Secondary | ICD-10-CM | POA: Diagnosis not present

## 2021-08-01 DIAGNOSIS — I2510 Atherosclerotic heart disease of native coronary artery without angina pectoris: Secondary | ICD-10-CM | POA: Diagnosis not present

## 2021-08-01 DIAGNOSIS — S72142D Displaced intertrochanteric fracture of left femur, subsequent encounter for closed fracture with routine healing: Secondary | ICD-10-CM | POA: Diagnosis not present

## 2021-08-01 DIAGNOSIS — I10 Essential (primary) hypertension: Secondary | ICD-10-CM | POA: Diagnosis not present

## 2021-08-04 DIAGNOSIS — R000 Tachycardia, unspecified: Secondary | ICD-10-CM | POA: Diagnosis not present

## 2021-08-04 DIAGNOSIS — I2510 Atherosclerotic heart disease of native coronary artery without angina pectoris: Secondary | ICD-10-CM | POA: Diagnosis not present

## 2021-08-04 DIAGNOSIS — E876 Hypokalemia: Secondary | ICD-10-CM | POA: Diagnosis not present

## 2021-08-04 DIAGNOSIS — I10 Essential (primary) hypertension: Secondary | ICD-10-CM | POA: Diagnosis not present

## 2021-08-04 DIAGNOSIS — J9611 Chronic respiratory failure with hypoxia: Secondary | ICD-10-CM | POA: Diagnosis not present

## 2021-08-04 DIAGNOSIS — S72142D Displaced intertrochanteric fracture of left femur, subsequent encounter for closed fracture with routine healing: Secondary | ICD-10-CM | POA: Diagnosis not present

## 2021-08-07 DIAGNOSIS — J9611 Chronic respiratory failure with hypoxia: Secondary | ICD-10-CM | POA: Diagnosis not present

## 2021-08-07 DIAGNOSIS — I10 Essential (primary) hypertension: Secondary | ICD-10-CM | POA: Diagnosis not present

## 2021-08-07 DIAGNOSIS — R000 Tachycardia, unspecified: Secondary | ICD-10-CM | POA: Diagnosis not present

## 2021-08-07 DIAGNOSIS — S72142D Displaced intertrochanteric fracture of left femur, subsequent encounter for closed fracture with routine healing: Secondary | ICD-10-CM | POA: Diagnosis not present

## 2021-08-07 DIAGNOSIS — I2510 Atherosclerotic heart disease of native coronary artery without angina pectoris: Secondary | ICD-10-CM | POA: Diagnosis not present

## 2021-08-07 DIAGNOSIS — E876 Hypokalemia: Secondary | ICD-10-CM | POA: Diagnosis not present

## 2021-08-08 DIAGNOSIS — R000 Tachycardia, unspecified: Secondary | ICD-10-CM | POA: Diagnosis not present

## 2021-08-08 DIAGNOSIS — S72142D Displaced intertrochanteric fracture of left femur, subsequent encounter for closed fracture with routine healing: Secondary | ICD-10-CM | POA: Diagnosis not present

## 2021-08-08 DIAGNOSIS — E876 Hypokalemia: Secondary | ICD-10-CM | POA: Diagnosis not present

## 2021-08-08 DIAGNOSIS — J9611 Chronic respiratory failure with hypoxia: Secondary | ICD-10-CM | POA: Diagnosis not present

## 2021-08-08 DIAGNOSIS — I10 Essential (primary) hypertension: Secondary | ICD-10-CM | POA: Diagnosis not present

## 2021-08-08 DIAGNOSIS — I2510 Atherosclerotic heart disease of native coronary artery without angina pectoris: Secondary | ICD-10-CM | POA: Diagnosis not present

## 2021-08-09 ENCOUNTER — Other Ambulatory Visit: Payer: Self-pay | Admitting: Internal Medicine

## 2021-08-11 ENCOUNTER — Telehealth: Payer: Self-pay | Admitting: Internal Medicine

## 2021-08-11 DIAGNOSIS — J9611 Chronic respiratory failure with hypoxia: Secondary | ICD-10-CM | POA: Diagnosis not present

## 2021-08-11 DIAGNOSIS — E876 Hypokalemia: Secondary | ICD-10-CM | POA: Diagnosis not present

## 2021-08-11 DIAGNOSIS — Z8614 Personal history of Methicillin resistant Staphylococcus aureus infection: Secondary | ICD-10-CM | POA: Diagnosis not present

## 2021-08-11 DIAGNOSIS — Z7902 Long term (current) use of antithrombotics/antiplatelets: Secondary | ICD-10-CM | POA: Diagnosis not present

## 2021-08-11 DIAGNOSIS — F418 Other specified anxiety disorders: Secondary | ICD-10-CM | POA: Diagnosis not present

## 2021-08-11 DIAGNOSIS — S72142D Displaced intertrochanteric fracture of left femur, subsequent encounter for closed fracture with routine healing: Secondary | ICD-10-CM | POA: Diagnosis not present

## 2021-08-11 DIAGNOSIS — Z9981 Dependence on supplemental oxygen: Secondary | ICD-10-CM | POA: Diagnosis not present

## 2021-08-11 DIAGNOSIS — I10 Essential (primary) hypertension: Secondary | ICD-10-CM | POA: Diagnosis not present

## 2021-08-11 DIAGNOSIS — G3184 Mild cognitive impairment, so stated: Secondary | ICD-10-CM | POA: Diagnosis not present

## 2021-08-11 DIAGNOSIS — I2510 Atherosclerotic heart disease of native coronary artery without angina pectoris: Secondary | ICD-10-CM | POA: Diagnosis not present

## 2021-08-11 DIAGNOSIS — R000 Tachycardia, unspecified: Secondary | ICD-10-CM | POA: Diagnosis not present

## 2021-08-11 DIAGNOSIS — Z87891 Personal history of nicotine dependence: Secondary | ICD-10-CM | POA: Diagnosis not present

## 2021-08-11 DIAGNOSIS — Z96642 Presence of left artificial hip joint: Secondary | ICD-10-CM | POA: Diagnosis not present

## 2021-08-11 NOTE — Telephone Encounter (Signed)
Called and let patient know she needs labs, she can come later in the morning of appointment and we also need urine

## 2021-08-11 NOTE — Telephone Encounter (Signed)
Carrie Chung  [phone]  [name] called to see if she really need any labs for her CPE since she was just in the hospital in July from a fall. She is still having a lot of trouble getting around. She stated the 3:00 CPE would be okay, but she can not make a 9:40 fasting lab appointment. She is thinking maybe all the labs have already been done.

## 2021-08-12 NOTE — Telephone Encounter (Signed)
Scheduled AMW by phone, canceled other appointments

## 2021-08-12 NOTE — Telephone Encounter (Signed)
Carrie Chung has called back today to see if she can get her labs thru the agency that is coming out to her house. She has asked them and they said they would be glad to do them, all we have to do is call them with an order. She also said there is no way she can do an physical exam yet, with going thru this surgery so recently, she can not lay down straight yet. So she would like to put it off, could we maybe just do her AMW this year with a phone call and put CPE off till next year? Then do an office visit with lab results.

## 2021-08-13 DIAGNOSIS — R000 Tachycardia, unspecified: Secondary | ICD-10-CM | POA: Diagnosis not present

## 2021-08-13 DIAGNOSIS — S72142D Displaced intertrochanteric fracture of left femur, subsequent encounter for closed fracture with routine healing: Secondary | ICD-10-CM | POA: Diagnosis not present

## 2021-08-13 DIAGNOSIS — J9611 Chronic respiratory failure with hypoxia: Secondary | ICD-10-CM | POA: Diagnosis not present

## 2021-08-13 DIAGNOSIS — E876 Hypokalemia: Secondary | ICD-10-CM | POA: Diagnosis not present

## 2021-08-13 DIAGNOSIS — I10 Essential (primary) hypertension: Secondary | ICD-10-CM | POA: Diagnosis not present

## 2021-08-13 DIAGNOSIS — I2510 Atherosclerotic heart disease of native coronary artery without angina pectoris: Secondary | ICD-10-CM | POA: Diagnosis not present

## 2021-08-15 ENCOUNTER — Encounter (HOSPITAL_BASED_OUTPATIENT_CLINIC_OR_DEPARTMENT_OTHER): Payer: Self-pay | Admitting: Physical Therapy

## 2021-08-15 ENCOUNTER — Other Ambulatory Visit: Payer: Self-pay

## 2021-08-15 ENCOUNTER — Ambulatory Visit (HOSPITAL_BASED_OUTPATIENT_CLINIC_OR_DEPARTMENT_OTHER): Payer: Medicare Other | Attending: Physical Medicine and Rehabilitation | Admitting: Physical Therapy

## 2021-08-15 DIAGNOSIS — R262 Difficulty in walking, not elsewhere classified: Secondary | ICD-10-CM | POA: Diagnosis not present

## 2021-08-15 DIAGNOSIS — S72142D Displaced intertrochanteric fracture of left femur, subsequent encounter for closed fracture with routine healing: Secondary | ICD-10-CM | POA: Diagnosis not present

## 2021-08-15 DIAGNOSIS — M25552 Pain in left hip: Secondary | ICD-10-CM | POA: Insufficient documentation

## 2021-08-15 DIAGNOSIS — M6281 Muscle weakness (generalized): Secondary | ICD-10-CM

## 2021-08-15 NOTE — Therapy (Signed)
[facility] [place], [place], [address] Phone: [phone]   Fax:  [phone]  Physical Therapy Evaluation  Patient Details  Name: Carrie Chung MRN: [id number] Date of Birth: [birth year] Referring Provider (PT): Edmonia Lynch, MD   Encounter Date: 08/15/2021   PT End of Session - 08/15/21 2010     Visit Number 1    Number of Visits 33    Date for PT Re-Evaluation 12/05/21    Authorization Type MCR    PT Start Time 1522    PT Stop Time 1607    PT Time Calculation (min) 45 min    Activity Tolerance Patient tolerated treatment well    Behavior During Therapy [facility] for tasks assessed/performed             Past Medical History:  Diagnosis Date   Atrial tachycardia ([place])    a. noted at cardiac rehab 5/13; event monitor ordered to assess for AFib   CAD (coronary artery disease)    a. NSTEMI 03/2012 ([place] 03/27/12: pLAD 30%, mLAD 99%, mCFX 95%, mRCA 99% with thrombus, EF 65%);  s/p PTCA/DESx1 to prox-mid RCA 03/27/12 urgently in setting of hypotension/bradycardia, with staged PTCA/Evolve study stent to mid LAD & PTCA/Evolve study stent to prox LCx 03/29/12 ;   echo 03/28/12:EF 60%, Aortic sclerosis without AS, mild RVE, mild reduced RVSF     Endometrial polyp    HTN (hypertension)    Hyperlipidemia    MI, old    Sleep apnea     Past Surgical History:  Procedure Laterality Date   APPENDECTOMY  80 years old   CAROTID STENT  04/2012   x3   CESAREAN SECTION  1984   CHOLECYSTECTOMY  1978   INTRAMEDULLARY (IM) NAIL INTERTROCHANTERIC Left 06/20/2021   Procedure: INTRAMEDULLARY (IM) NAIL INTERTROCHANTRIC;  Surgeon: Renette Butters, MD;  Location: [facility];  Service: Orthopedics;  Laterality: Left;   LEFT HEART CATHETERIZATION WITH CORONARY ANGIOGRAM N/A 03/27/2012   Procedure: LEFT HEART CATHETERIZATION WITH CORONARY ANGIOGRAM;  Surgeon: Burnell Blanks, MD;  Location: [facility] CATH LAB;  Service: Cardiovascular;   Laterality: N/A;   PERCUTANEOUS CORONARY STENT INTERVENTION (PCI-S) N/A 03/27/2012   Procedure: PERCUTANEOUS CORONARY STENT INTERVENTION (PCI-S);  Surgeon: Burnell Blanks, MD;  Location: [facility] CATH LAB;  Service: Cardiovascular;  Laterality: N/A;   PERCUTANEOUS CORONARY STENT INTERVENTION (PCI-S) N/A 03/29/2012   Procedure: PERCUTANEOUS CORONARY STENT INTERVENTION (PCI-S);  Surgeon: [name], MD;  Location: [facility] CATH LAB;  Service: Cardiovascular;  Laterality: N/A;   TONSILLECTOMY  80 years old    There were no vitals filed for this visit.    Subjective Assessment - 08/15/21 1522     Subjective Pain levels are up and down. Still get some sharp pain even when I am just laying in bed. Did HH PT and was d/c on Wed. Walked with SPC with SBA for safety with PT. Dr Percell Miller said I am ahead of schedule. Still cannot lay on Lt hip.    How long can you stand comfortably? 10 min with RW for support    Patient Stated Goals play with grand children, use the nu step the way I used to (as a cardio challenge), stand in kitchen comfortably to cook, navigate stairs- HR on one side, sleep on Lt side.    Currently in Pain? Yes    Pain Score 6     Pain Location Hip    Pain Orientation Left;Lateral    Pain Descriptors /  Indicators Aching    Pain Type Surgical pain    Aggravating Factors  moving around    Pain Relieving Factors meds                [facility] PT Assessment - 08/15/21 0001       Assessment   Medical Diagnosis s/p IM Nail Lt femur, closed reduction and manipultion of Lt hip fx    Referring Provider (PT) Edmonia Lynch, MD    Onset Date/Surgical Date 06/20/21    Hand Dominance Right    Prior Therapy inpatient and home health      Precautions   Precautions Fall      Restrictions   Weight Bearing Restrictions No      Balance Screen   Has the patient fallen in the past 6 months Yes    How many times? 1    Has the patient had a decrease in activity level because of a fear of  falling?  Yes    Is the patient reluctant to leave their home because of a fear of falling?  No      Home Ecologist residence    Living Arrangements Spouse/significant other    Additional Comments stairs with hand rail- on Rt ascending      Prior Function   Level of Independence Independent    Vocation Requirements play with grand children, cooking      Cognition   Overall Cognitive Status Within Functional Limits for tasks assessed      Sensation   Additional Comments WFL      ROM / Strength   AROM / PROM / Strength AROM;Strength;PROM      AROM   Overall AROM Comments standing hip ext -8 deg      PROM   PROM Assessment Site Hip    Right/Left Hip Left    Left Hip Flexion 90   stretchy end feel with pain   Left Hip External Rotation  --   <10   Left Hip Internal Rotation  --   <10     Strength   Overall Strength Comments able to stand wiht minimal use of UE, unable to demo lift against gravity      Ambulation/Gait   Gait Comments step through with RW, flat foot strike, slow cadence                        Objective measurements completed on examination: See above findings.       [facility] Adult PT Treatment/Exercise - 08/15/21 0001       Exercises   Exercises Knee/Hip      Knee/Hip Exercises: Standing   Other Standing Knee Exercises glut sets, lateral weight shift      Knee/Hip Exercises: Seated   Other Seated Knee/Hip Exercises seated quad set, heel slide on towel    Abd/Adduction Limitations seated clams                    PT Education - 08/15/21 2009     Education Details anatomy of condition, POC HEP, exercise form/rationale    Person(s) Educated Patient;Child(ren)    Methods Explanation;Demonstration;Tactile cues;Verbal cues;Handout    Comprehension Verbalized understanding;Returned demonstration;Verbal cues required;Tactile cues required;Need further instruction              PT Short Term  Goals - 08/15/21 2038       PT SHORT TERM GOAL #1   Title  pt will demo hip flexion, abd and extension strength at least 4/5    Baseline 3-/5 at eval    Time 4    Period Weeks    Status New    Target Date 09/12/21      PT SHORT TERM GOAL #2   Title pt will be safe in household ambulation with SPC    Baseline requires RW at eval    Time 4    Period Weeks    Status New    Target Date 09/12/21      PT SHORT TERM GOAL #3   Title pt will be able to navigate stairs with use of hand rail and SPC to go upstairs in her home    Baseline has done 7 steps with HHPT    Time 4    Period Weeks    Status New    Target Date 09/12/21               PT Long Term Goals - 08/15/21 2050       PT LONG TERM GOAL #1   Title Pt will be able to play with her grandchildren without limitations    Baseline unable at eval    Time 16    Period Weeks    Status New    Target Date 12/05/21      PT LONG TERM GOAL #2   Title pt will be able to ambulate for at least 1 hour without AD for functional ambulation    Baseline unable and requires RW at eval    Time 16    Period Weeks    Status New    Target Date 12/05/21      PT LONG TERM GOAL #3   Title pt will be able to stand and cook large meal without rest break    Baseline unable at eval    Time 16    Period Weeks    Status New    Target Date 12/05/21      PT LONG TERM GOAL #4   Title pt will d/c to proper long term program including nustep for cardiac challenges    Baseline unable at eval    Time 16    Period Weeks    Status New    Target Date 12/05/21                    Plan - 08/15/21 2011     Clinical Impression Statement Pt presents to PT s/p femoral IM nail with compaints of pain and limited functional strength and mobility. She is able to stand quickly from a chair with minimal assist from UEs. At this time I have asked her to continue to use her RW rather than SPC until her gross strength increases. She will do  aquatic exercises for the next 5 weeks as she and her daughter think they can get her into her pool at home for aquatic HEP. Following aquatic strengthening, we will transition to land-based exercises to continue toward functional goals.    Examination-Activity Limitations Bathing;Locomotion Level;Bed Mobility;Bend;Sit;Caring for Others;Carry;Sleep;Squat;Stairs;Stand    Examination-Participation Restrictions Meal Prep;Cleaning;Community Activity    Stability/Clinical Decision Making Stable/Uncomplicated    Clinical Decision Making Low    Rehab Potential Good    PT Frequency 2x / week    PT Duration Other (comment)   16 weeks   PT Treatment/Interventions ADLs/Self Care Home Management;Aquatic Therapy;Cryotherapy;Electrical Stimulation;Gait training;Ultrasound;Traction;Moist Heat;Stair training;Functional mobility training;Therapeutic activities;Therapeutic exercise;Balance training;Neuromuscular re-education;Manual techniques;Patient/family  education;Passive range of motion;Dry needling;Taping    PT Next Visit Plan aquatics- gross strength and ROM of hip to tolerance, full biomechanical chain strength and flexibility    PT Home Exercise Plan [email or web address]    Consulted and Agree with Plan of Care Patient             Patient will benefit from skilled therapeutic intervention in order to improve the following deficits and impairments:  Abnormal gait, Decreased range of motion, Difficulty walking, Decreased activity tolerance, Pain, Improper body mechanics, Impaired flexibility, Decreased balance, Decreased scar mobility, Decreased strength, Postural dysfunction  Visit Diagnosis: Pain in left hip - Plan: PT plan of care cert/re-cert  Muscle weakness (generalized) - Plan: PT plan of care cert/re-cert  Difficulty in walking, not elsewhere classified - Plan: PT plan of care cert/re-cert     Problem List Patient Active Problem List   Diagnosis Date Noted   Intertrochanteric fracture of left hip  ([place]) 06/24/2021   Closed left hip fracture, initial encounter ([place]) 06/20/2021   Hypokalemia 06/20/2021   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   Cognitive complaints 06/02/2016   Hypoxemic respiratory failure, chronic ([place]) 09/19/2015   MCI (mild cognitive impairment) 03/06/2015   Depression with anxiety 03/06/2015   B[place]ae 07/02/2014   History of MRSA infection 11/20/2012   Low back pain 07/07/2012   Insomnia 04/28/2012   Coronary Artery Disease 04/19/2012   Hyperlipidemia 04/19/2012   Anxiety 04/19/2012   Non-ST elevation myocardial infarction (NSTEMI), initial care episode ([place]) 03/28/2012   HYPERTENSION, BENIGN 06/12/2010   Dyspnea 06/12/2010   Prolonged QT interval 06/12/2010   Sigrid Schwebach C. Makailee Nudelman PT, DPT 08/15/21 8:58 PM   [facility] [address] [place], [place], [address] Phone: [phone]   Fax:  [phone]  Name: Carrie Chung MRN: [id number] Date of Birth: 1941/03/26

## 2021-08-18 NOTE — Progress Notes (Deleted)
   Subjective:    Patient ID: Carrie Chung, female    DOB: 1941-01-28, 80 y.o.   MRN: [id number]  HPI    Review of Systems     Objective:   Physical Exam        Assessment & Plan:

## 2021-08-19 ENCOUNTER — Encounter (HOSPITAL_BASED_OUTPATIENT_CLINIC_OR_DEPARTMENT_OTHER): Payer: Self-pay | Admitting: Physical Therapy

## 2021-08-19 ENCOUNTER — Other Ambulatory Visit: Payer: Self-pay

## 2021-08-19 ENCOUNTER — Ambulatory Visit (HOSPITAL_BASED_OUTPATIENT_CLINIC_OR_DEPARTMENT_OTHER): Payer: Medicare Other | Admitting: Physical Therapy

## 2021-08-19 ENCOUNTER — Other Ambulatory Visit: Payer: Medicare Other | Admitting: Internal Medicine

## 2021-08-19 DIAGNOSIS — M25552 Pain in left hip: Secondary | ICD-10-CM

## 2021-08-19 DIAGNOSIS — R262 Difficulty in walking, not elsewhere classified: Secondary | ICD-10-CM | POA: Diagnosis not present

## 2021-08-19 DIAGNOSIS — R509 Fever, unspecified: Secondary | ICD-10-CM

## 2021-08-19 DIAGNOSIS — M6281 Muscle weakness (generalized): Secondary | ICD-10-CM | POA: Diagnosis not present

## 2021-08-19 NOTE — Therapy (Signed)
[facility] [address] [place], [place], [address] Phone: [phone]   Fax:  [phone]  Physical Therapy Treatment  Patient Details  Name: Carrie Chung MRN: [id number] Date of Birth: 04-Mar-1941 Referring Provider (PT): Edmonia Lynch, MD   Encounter Date: 08/19/2021   PT End of Session - 08/19/21 1102     Visit Number 2    Number of Visits 5    PT Start Time 1031    PT Stop Time 1115    PT Time Calculation (min) 44 min    Equipment Utilized During Treatment Other (comment)   Ankle cuff and waist buoys, kick board, noodle/sqoodle, nekdoodle, weights and barbells, water walker   Activity Tolerance Patient tolerated treatment well    Behavior During Therapy [facility] for tasks assessed/performed             Past Medical History:  Diagnosis Date   Atrial tachycardia ([place])    a. noted at cardiac rehab 5/13; event monitor ordered to assess for AFib   CAD (coronary artery disease)    a. NSTEMI 03/2012 ([place] 03/27/12: pLAD 30%, mLAD 99%, mCFX 95%, mRCA 99% with thrombus, EF 65%);  s/p PTCA/DESx1 to prox-mid RCA 03/27/12 urgently in setting of hypotension/bradycardia, with staged PTCA/Evolve study stent to mid LAD & PTCA/Evolve study stent to prox LCx 03/29/12 ;   echo 03/28/12:EF 60%, Aortic sclerosis without AS, mild RVE, mild reduced RVSF     Endometrial polyp    HTN (hypertension)    Hyperlipidemia    MI, old    Sleep apnea     Past Surgical History:  Procedure Laterality Date   APPENDECTOMY  80 years old   CAROTID STENT  04/2012   x3   CESAREAN SECTION  1984   CHOLECYSTECTOMY  1978   INTRAMEDULLARY (IM) NAIL INTERTROCHANTERIC Left 06/20/2021   Procedure: INTRAMEDULLARY (IM) NAIL INTERTROCHANTRIC;  Surgeon: Renette Butters, MD;  Location: [place];  Service: Orthopedics;  Laterality: Left;   LEFT HEART CATHETERIZATION WITH CORONARY ANGIOGRAM N/A 03/27/2012   Procedure: LEFT HEART CATHETERIZATION WITH CORONARY ANGIOGRAM;   Surgeon: Burnell Blanks, MD;  Location: [facility] CATH LAB;  Service: Cardiovascular;  Laterality: N/A;   PERCUTANEOUS CORONARY STENT INTERVENTION (PCI-S) N/A 03/27/2012   Procedure: PERCUTANEOUS CORONARY STENT INTERVENTION (PCI-S);  Surgeon: Burnell Blanks, MD;  Location: [facility] CATH LAB;  Service: Cardiovascular;  Laterality: N/A;   PERCUTANEOUS CORONARY STENT INTERVENTION (PCI-S) N/A 03/29/2012   Procedure: PERCUTANEOUS CORONARY STENT INTERVENTION (PCI-S);  Surgeon: [name], MD;  Location: [facility] CATH LAB;  Service: Cardiovascular;  Laterality: N/A;   TONSILLECTOMY  79 years old    There were no vitals filed for this visit.   Subjective Assessment - 08/19/21 1323     Subjective "They wont let me walk with my cane, I think I can. Excitd to get into the water"    Currently in Pain? Yes                                       PT Education - 08/19/21 1324     Education Details Properties of water; benefits of aquatic therapy    Person(s) Educated Patient    Methods Explanation    Comprehension Verbalized understanding              PT Short Term Goals - 08/15/21 2038       PT SHORT  TERM GOAL #1   Title pt will demo hip flexion, abd and extension strength at least 4/5    Baseline 3-/5 at eval    Time 4    Period Weeks    Status New    Target Date 09/12/21      PT SHORT TERM GOAL #2   Title pt will be safe in household ambulation with SPC    Baseline requires RW at eval    Time 4    Period Weeks    Status New    Target Date 09/12/21      PT SHORT TERM GOAL #3   Title pt will be able to navigate stairs with use of hand rail and SPC to go upstairs in her home    Baseline has done 7 steps with HHPT    Time 4    Period Weeks    Status New    Target Date 09/12/21               PT Long Term Goals - 08/15/21 2050       PT LONG TERM GOAL #1   Title Pt will be able to play with her grandchildren without limitations    Baseline  unable at eval    Time 16    Period Weeks    Status New    Target Date 12/05/21      PT LONG TERM GOAL #2   Title pt will be able to ambulate for at least 1 hour without AD for functional ambulation    Baseline unable and requires RW at eval    Time 16    Period Weeks    Status New    Target Date 12/05/21      PT LONG TERM GOAL #3   Title pt will be able to stand and cook large meal without rest break    Baseline unable at eval    Time 16    Period Weeks    Status New    Target Date 12/05/21      PT LONG TERM GOAL #4   Title pt will d/c to proper long term program including nustep for cardiac challenges    Baseline unable at eval    Time 16    Period Weeks    Status New    Target Date 12/05/21            Pt seen for aquatic therapy today.  Treatment took place in water 3.25-4.8 ft in depth at the [facility] pool. Temp of water was 91.  Pt entered/exited the pool via stairs step to pattern independently with bilat rail.   Introduction to water  Warm up forward, backward and side stepping/walking cues for increased step length, increased speed, hand placement to increase resistance. 4 laps each.  TC  amb.  Seated gastroc, hamstrings, and glute on wall and sitting on bench of pool. Knee flex/ext 2 x 10 cuing for increasing speed.  Standing -high knee marching x 10; add/abd 2 x 10, hip ext 2 x 10.  VC for increased effort with pull down.  Supine/prone suspension Supported by noodle kicking, scissoring and add/abd x 10 minutes.  Pt with hip discomfort with prone in left hip, tolerates well in supine   Pt requires buoyancy for support and to offload joints with strengthening exercises. Viscosity of the water is needed for resistance of strengthening; water current perturbations provides challenge to standing balance unsupported, requiring increased core activation.  Plan - 08/19/21 1103     Clinical Impression Statement Little to no  apprehension with setting. Pt familar with water and immediately adjusts to movement.  She initially amb holding to yellow noodle then progresses to blue noodle. Pt directed through exercises in both open and closed chain positions.    Examination-Activity Limitations Bathing;Locomotion Level;Bed Mobility;Bend;Sit;Caring for Others;Carry;Sleep;Squat;Stairs;Stand    Stability/Clinical Decision Making Stable/Uncomplicated    Clinical Decision Making Low    Rehab Potential Good    PT Frequency 2x / week    PT Treatment/Interventions ADLs/Self Care Home Management;Aquatic Therapy;Cryotherapy;Electrical Stimulation;Gait training;Ultrasound;Traction;Moist Heat;Stair training;Functional mobility training;Therapeutic activities;Therapeutic exercise;Balance training;Neuromuscular re-education;Manual techniques;Patient/family education;Passive range of motion;Dry needling;Taping    PT Home Exercise Plan [email or web address]    Consulted and Agree with Plan of Care Patient             Patient will benefit from skilled therapeutic intervention in order to improve the following deficits and impairments:  Abnormal gait, Decreased range of motion, Difficulty walking, Decreased activity tolerance, Pain, Improper body mechanics, Impaired flexibility, Decreased balance, Decreased scar mobility, Decreased strength, Postural dysfunction  Visit Diagnosis: Pain in left hip  Fever and chills  Muscle weakness (generalized)  Difficulty in walking, not elsewhere classified     Problem List Patient Active Problem List   Diagnosis Date Noted   Intertrochanteric fracture of left hip ([place]) 06/24/2021   Closed left hip fracture, initial encounter ([place]) 06/20/2021   Hypokalemia 06/20/2021   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   Cognitive complaints 06/02/2016   Hypoxemic respiratory failure, chronic ([place]) 09/19/2015   MCI (mild cognitive impairment) 03/06/2015   Depression with anxiety 03/06/2015   Bullae 07/02/2014   History  of MRSA infection 11/20/2012   Low back pain 07/07/2012   Insomnia 04/28/2012   Coronary Artery Disease 04/19/2012   Hyperlipidemia 04/19/2012   Anxiety 04/19/2012   Non-ST elevation myocardial infarction (NSTEMI), initial care episode ([place]) 03/28/2012   HYPERTENSION, BENIGN 06/12/2010   Dyspnea 06/12/2010   Prolonged QT interval 06/12/2010    Vedia Pereyra 08/19/2021, 1:46 PM  [facility] [address] [place], [place], [address] Phone: [phone]   Fax:  [phone]  Name: Carrie Chung MRN: [id number] Date of Birth: 1941-01-02

## 2021-08-20 ENCOUNTER — Encounter (HOSPITAL_BASED_OUTPATIENT_CLINIC_OR_DEPARTMENT_OTHER): Payer: Self-pay | Admitting: Physical Therapy

## 2021-08-20 ENCOUNTER — Ambulatory Visit (HOSPITAL_BASED_OUTPATIENT_CLINIC_OR_DEPARTMENT_OTHER): Payer: Medicare Other | Admitting: Physical Therapy

## 2021-08-20 ENCOUNTER — Ambulatory Visit: Payer: Medicare Other | Admitting: Internal Medicine

## 2021-08-20 DIAGNOSIS — R262 Difficulty in walking, not elsewhere classified: Secondary | ICD-10-CM | POA: Diagnosis not present

## 2021-08-20 DIAGNOSIS — M25552 Pain in left hip: Secondary | ICD-10-CM | POA: Diagnosis not present

## 2021-08-20 DIAGNOSIS — M6281 Muscle weakness (generalized): Secondary | ICD-10-CM | POA: Diagnosis not present

## 2021-08-20 NOTE — Therapy (Signed)
[place], [place], [address] Phone: [phone]   Fax:  [phone]  Physical Therapy Treatment  Patient Details  Name: Carrie Chung MRN: [id number] Date of Birth: 1941/11/26 Referring Provider (PT): Edmonia Lynch, MD   Encounter Date: 08/20/2021   PT End of Session - 08/20/21 1514     Visit Number 3    Number of Visits 33    Date for PT Re-Evaluation 12/05/21    Authorization Type MCR    PT Start Time 1501    PT Stop Time 1545    PT Time Calculation (min) 44 min    Equipment Utilized During Treatment Other (comment)    Activity Tolerance Patient tolerated treatment well    Behavior During Therapy [facility] for tasks assessed/performed             Past Medical History:  Diagnosis Date   Atrial tachycardia ([place])    a. noted at cardiac rehab 5/13; event monitor ordered to assess for AFib   CAD (coronary artery disease)    a. NSTEMI 03/2012 ([place] 03/27/12: pLAD 30%, mLAD 99%, mCFX 95%, mRCA 99% with thrombus, EF 65%);  s/p PTCA/DESx1 to prox-mid RCA 03/27/12 urgently in setting of hypotension/bradycardia, with staged PTCA/Evolve study stent to mid LAD & PTCA/Evolve study stent to prox LCx 03/29/12 ;   echo 03/28/12:EF 60%, Aortic sclerosis without AS, mild RVE, mild reduced RVSF     Endometrial polyp    HTN (hypertension)    Hyperlipidemia    MI, old    Sleep apnea     Past Surgical History:  Procedure Laterality Date   APPENDECTOMY  80 years old   CAROTID STENT  04/2012   x3   CESAREAN SECTION  1984   CHOLECYSTECTOMY  1978   INTRAMEDULLARY (IM) NAIL INTERTROCHANTERIC Left 06/20/2021   Procedure: INTRAMEDULLARY (IM) NAIL INTERTROCHANTRIC;  Surgeon: Renette Butters, MD;  Location: [place];  Service: Orthopedics;  Laterality: Left;   LEFT HEART CATHETERIZATION WITH CORONARY ANGIOGRAM N/A 03/27/2012   Procedure: LEFT HEART CATHETERIZATION WITH CORONARY ANGIOGRAM;  Surgeon: Burnell Blanks, MD;   Location[facility] CATH LAB;  Service: Cardiovascular;  Laterality: N/A;   PERCUTANEOUS CORONARY STENT INTERVENTION (PCI-S) N/A 03/27/2012   Procedure: PERCUTANEOUS CORONARY STENT INTERVENTION (PCI-S);  Surgeon: Burnell Blanks, MD;  Location: [facility] CATH LAB;  Service: Cardiovascular;  Laterality: N/A;   PERCUTANEOUS CORONARY STENT INTERVENTION (PCI-S) N/A 03/29/2012   Procedure: PERCUTANEOUS CORONARY STENT INTERVENTION (PCI-S);  Surgeon: [name], MD;  Location: [facility] CATH LAB;  Service: Cardiovascular;  Laterality: N/A;   TONSILLECTOMY  80 years old    There were no vitals filed for this visit.   Subjective Assessment - 08/20/21 1527     Subjective "Tired last night but no extra pain"    Patient Stated Goals play with grand children, use the nu step the way I used to (as a cardio challenge), stand in kitchen comfortably to cook, navigate stairs- HR on one side, sleep on Lt side.    Currently in Pain? Yes    Pain Score 5     Pain Location Hip    Pain Orientation Left;Lateral    Pain Descriptors / Indicators Aching                                         PT Short Term Goals - 08/15/21 2038  PT SHORT TERM GOAL #1   Title pt will demo hip flexion, abd and extension strength at least 4/5    Baseline 3-/5 at eval    Time 4    Period Weeks    Status New    Target Date 09/12/21      PT SHORT TERM GOAL #2   Title pt will be safe in household ambulation with SPC    Baseline requires RW at eval    Time 4    Period Weeks    Status New    Target Date 09/12/21      PT SHORT TERM GOAL #3   Title pt will be able to navigate stairs with use of hand rail and SPC to go upstairs in her home    Baseline has done 7 steps with HHPT    Time 4    Period Weeks    Status New    Target Date 09/12/21               PT Long Term Goals - 08/15/21 2050       PT LONG TERM GOAL #1   Title Pt will be able to play with her grandchildren without limitations     Baseline unable at eval    Time 16    Period Weeks    Status New    Target Date 12/05/21      PT LONG TERM GOAL #2   Title pt will be able to ambulate for at least 1 hour without AD for functional ambulation    Baseline unable and requires RW at eval    Time 16    Period Weeks    Status New    Target Date 12/05/21      PT LONG TERM GOAL #3   Title pt will be able to stand and cook large meal without rest break    Baseline unable at eval    Time 16    Period Weeks    Status New    Target Date 12/05/21      PT LONG TERM GOAL #4   Title pt will d/c to proper long term program including nustep for cardiac challenges    Baseline unable at eval    Time 16    Period Weeks    Status New    Target Date 12/05/21               Pt seen for aquatic therapy today.  Treatment took place in water 3.25-4.8 ft in depth at the [facility] pool. Temp of water was 91.  Pt entered/exited the pool via stairs step to pattern CGA with bilat rail.     Warm up forward, backward and side stepping/walking cues for increased step length, increased speed, hand placement to increase resistance. 4 laps each.  VC and TC for core control/balance and righting.   Seated -gastroc, hamstrings, and glute on wall and sitting on bench of pool 5 x 20 sec hold -Knee flex/ext 2 x 10 cuing for increasing speed. -Sit to stand from water bench x 10.  VC  for execution   Standing -high knee marching 2 x 10; add/abd 2 x 10, hip ext 2 x 10.  VC for increased effort with pull down. Ue support triangular hand buoys  - step ups  leading R/L x 10 reps submerged chest deep bilat ue support on wall     Pt requires buoyancy for support and to offload joints with  strengthening exercises. Viscosity of the water is needed for resistance of strengthening; water current perturbations provides challenge to standing balance unsupported, requiring increased core activation.         Plan - 08/20/21 1530      Clinical Impression Statement Pt perception upon entering pool that "this is harder today" PT decreased ue support to blue noodle then triangle hand buoys increasing difficulty. Pt with greater challenge of core strength and reflexes. She was able to correct her minor LOB's indep. After several minutes of completing her ability impoved.  I continued with the hand buoys through treatment improving her core activation.  Pt exits pool using alternating pattern x 2 then step to.    PT Treatment/Interventions ADLs/Self Care Home Management;Aquatic Therapy;Cryotherapy;Electrical Stimulation;Gait training;Ultrasound;Traction;Moist Heat;Stair training;Functional mobility training;Therapeutic activities;Therapeutic exercise;Balance training;Neuromuscular re-education;Manual techniques;Patient/family education;Passive range of motion;Dry needling;Taping             Patient will benefit from skilled therapeutic intervention in order to improve the following deficits and impairments:  Abnormal gait, Decreased range of motion, Difficulty walking, Decreased activity tolerance, Pain, Improper body mechanics, Impaired flexibility, Decreased balance, Decreased scar mobility, Decreased strength, Postural dysfunction  Visit Diagnosis: Pain in left hip  Muscle weakness (generalized)  Difficulty in walking, not elsewhere classified     Problem List Patient Active Problem List   Diagnosis Date Noted   Intertrochanteric fracture of left hip ([place]) 06/24/2021   Closed left hip fracture, initial encounter ([place]) 06/20/2021   Hypokalemia 06/20/2021   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   Cognitive complaints 06/02/2016   Hypoxemic respiratory failure, chronic ([place]) 09/19/2015   MCI (mild cognitive impairment) 03/06/2015   Depression with anxiety 03/06/2015   Bullae 07/02/2014   History of MRSA infection 11/20/2012   Low back pain 07/07/2012   Insomnia 04/28/2012   Coronary Artery Disease 04/19/2012   Hyperlipidemia  04/19/2012   Anxiety 04/19/2012   Non-ST elevation myocardial infarction (NSTEMI), initial care episode ([place]) 03/28/2012   HYPERTENSION, BENIGN 06/12/2010   Dyspnea 06/12/2010   Prolonged QT interval 06/12/2010    Vedia Pereyra MPT 08/20/2021, 7:04 PM  [facility] [address] [place], [place], [address] Phone: [phone]   Fax:  [phone]  Name: RONNESHA PYNES MRN: [id number] Date of Birth: 1941/02/17

## 2021-08-21 ENCOUNTER — Encounter: Payer: Medicare Other | Admitting: Internal Medicine

## 2021-08-22 ENCOUNTER — Ambulatory Visit (INDEPENDENT_AMBULATORY_CARE_PROVIDER_SITE_OTHER): Payer: Medicare Other | Admitting: Podiatry

## 2021-08-22 ENCOUNTER — Encounter: Payer: Self-pay | Admitting: Podiatry

## 2021-08-22 ENCOUNTER — Other Ambulatory Visit: Payer: Self-pay

## 2021-08-22 DIAGNOSIS — M79674 Pain in right toe(s): Secondary | ICD-10-CM

## 2021-08-22 DIAGNOSIS — I25119 Atherosclerotic heart disease of native coronary artery with unspecified angina pectoris: Secondary | ICD-10-CM

## 2021-08-22 DIAGNOSIS — B351 Tinea unguium: Secondary | ICD-10-CM | POA: Diagnosis not present

## 2021-08-22 DIAGNOSIS — M79675 Pain in left toe(s): Secondary | ICD-10-CM | POA: Diagnosis not present

## 2021-08-22 NOTE — Progress Notes (Signed)
This patient returns to the office for evaluation and treatment of long thick painful nails .  This patient is unable to trim his own nails since the patient cannot reach his feet.  She has history of hip surgery which prevents her from touching her feet.  Patient says the nails are painful walking and wearing his shoes.  He returns for preventive foot care services.  General Appearance  Alert, conversant and in no acute stress.  Vascular  Dorsalis pedis and posterior tibial  pulses are absent  bilaterally.  Capillary return is within normal limits  bilaterally. Temperature is within normal limits  bilaterally.  Neurologic  Senn-Weinstein monofilament wire test within normal limits  bilaterally. Muscle power within normal limits bilaterally.  Nails Thick disfigured discolored nails with subungual debris  from hallux to fifth toes bilaterally. No evidence of bacterial infection or drainage bilaterally.  Orthopedic  No limitations of motion  feet .  No crepitus or effusions noted.  HAV  B/L.  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  No signs of infections or ulcers noted.     Onychomycosis  Pain in toes right foot  Pain in toes left foot  Debridement  of nails  1-5  B/L with a nail nipper.  Nails were then filed using a dremel tool with no incidents.    RTC 4 months    Gardiner Barefoot DPM

## 2021-08-26 ENCOUNTER — Other Ambulatory Visit: Payer: Self-pay

## 2021-08-26 ENCOUNTER — Ambulatory Visit (HOSPITAL_BASED_OUTPATIENT_CLINIC_OR_DEPARTMENT_OTHER): Payer: Medicare Other | Attending: Physical Medicine and Rehabilitation | Admitting: Physical Therapy

## 2021-08-26 ENCOUNTER — Encounter (HOSPITAL_BASED_OUTPATIENT_CLINIC_OR_DEPARTMENT_OTHER): Payer: Self-pay | Admitting: Physical Therapy

## 2021-08-26 DIAGNOSIS — R262 Difficulty in walking, not elsewhere classified: Secondary | ICD-10-CM | POA: Insufficient documentation

## 2021-08-26 DIAGNOSIS — M6281 Muscle weakness (generalized): Secondary | ICD-10-CM | POA: Insufficient documentation

## 2021-08-26 DIAGNOSIS — M25552 Pain in left hip: Secondary | ICD-10-CM | POA: Insufficient documentation

## 2021-08-26 NOTE — Therapy (Signed)
[place], [place], [address] Phone: [phone]   Fax:  [phone]  Physical Therapy Treatment  Patient Details  Name: Carrie Chung MRN: [id number] Date of Birth: 1941/03/20 Referring Provider (PT): Edmonia Lynch, MD   Encounter Date: 08/26/2021   PT End of Session - 08/26/21 1711     Visit Number 4    Number of Visits 33    Date for PT Re-Evaluation 12/05/21    Authorization Type MCR    PT Start Time 1705    PT Stop Time 1745    PT Time Calculation (min) 40 min    Equipment Utilized During Treatment Other (comment)    Activity Tolerance Patient tolerated treatment well    Behavior During Therapy [facility] for tasks assessed/performed             Past Medical History:  Diagnosis Date   Atrial tachycardia ([place])    a. noted at cardiac rehab 5/13; event monitor ordered to assess for AFib   CAD (coronary artery disease)    a. NSTEMI 03/2012 ([place] 03/27/12: pLAD 30%, mLAD 99%, mCFX 95%, mRCA 99% with thrombus, EF 65%);  s/p PTCA/DESx1 to prox-mid RCA 03/27/12 urgently in setting of hypotension/bradycardia, with staged PTCA/Evolve study stent to mid LAD & PTCA/Evolve study stent to prox LCx 03/29/12 ;   echo 03/28/12:EF 60%, Aortic sclerosis without AS, mild RVE, mild reduced RVSF     Endometrial polyp    HTN (hypertension)    Hyperlipidemia    MI, old    Sleep apnea     Past Surgical History:  Procedure Laterality Date   APPENDECTOMY  80 years old   CAROTID STENT  04/2012   x3   CESAREAN SECTION  1984   CHOLECYSTECTOMY  1978   INTRAMEDULLARY (IM) NAIL INTERTROCHANTERIC Left 06/20/2021   Procedure: INTRAMEDULLARY (IM) NAIL INTERTROCHANTRIC;  Surgeon: Renette Butters, MD;  Location: [place];  Service: Orthopedics;  Laterality: Left;   LEFT HEART CATHETERIZATION WITH CORONARY ANGIOGRAM N/A 03/27/2012   Procedure: LEFT HEART CATHETERIZATION WITH CORONARY ANGIOGRAM;  Surgeon: Burnell Blanks, MD;   Location: [facility] CATH LAB;  Service: Cardiovascular;  Laterality: N/A;   PERCUTANEOUS CORONARY STENT INTERVENTION (PCI-S) N/A 03/27/2012   Procedure: PERCUTANEOUS CORONARY STENT INTERVENTION (PCI-S);  Surgeon: Burnell Blanks, MD;  Location: [facility] CATH LAB;  Service: Cardiovascular;  Laterality: N/A;   PERCUTANEOUS CORONARY STENT INTERVENTION (PCI-S) N/A 03/29/2012   Procedure: PERCUTANEOUS CORONARY STENT INTERVENTION (PCI-S);  Surgeon: [name], MD;  Location: [facility] CATH LAB;  Service: Cardiovascular;  Laterality: N/A;   TONSILLECTOMY  80 years old    There were no vitals filed for this visit.   Subjective Assessment - 08/26/21 1814     Subjective "I tripped over my rug at home over the weekend and hit my face on the walker, I didn't hurt myself other than theses bruises (left eye)'    Currently in Pain? Yes    Pain Score 4     Pain Descriptors / Indicators Aching                                     Upper Extremity Functional Index Score :   /80     PT Short Term Goals - 08/15/21 2038       PT SHORT TERM GOAL #1   Title pt will demo hip flexion, abd and extension  strength at least 4/5    Baseline 3-/5 at eval    Time 4    Period Weeks    Status New    Target Date 09/12/21      PT SHORT TERM GOAL #2   Title pt will be safe in household ambulation with SPC    Baseline requires RW at eval    Time 4    Period Weeks    Status New    Target Date 09/12/21      PT SHORT TERM GOAL #3   Title pt will be able to navigate stairs with use of hand rail and SPC to go upstairs in her home    Baseline has done 7 steps with HHPT    Time 4    Period Weeks    Status New    Target Date 09/12/21               PT Long Term Goals - 08/15/21 2050       PT LONG TERM GOAL #1   Title Pt will be able to play with her grandchildren without limitations    Baseline unable at eval    Time 16    Period Weeks    Status New    Target Date 12/05/21      PT  LONG TERM GOAL #2   Title pt will be able to ambulate for at least 1 hour without AD for functional ambulation    Baseline unable and requires RW at eval    Time 16    Period Weeks    Status New    Target Date 12/05/21      PT LONG TERM GOAL #3   Title pt will be able to stand and cook large meal without rest break    Baseline unable at eval    Time 16    Period Weeks    Status New    Target Date 12/05/21      PT LONG TERM GOAL #4   Title pt will d/c to proper long term program including nustep for cardiac challenges    Baseline unable at eval    Time 16    Period Weeks    Status New    Target Date 12/05/21            Pt seen for aquatic therapy today.  Treatment took place in water 3.25-4.8 ft in depth at the [facility] pool. Temp of water was 91.  Pt entered/exited the pool via stairs step to pattern CGA with bilat rail.     Warm up forward, backward and side stepping/walking cues for increased step length, increased speed, hand placement to increase resistance. 4 laps each.    Seated -gastroc, hamstrings, adductors and glute on wall and sitting on bench of pool 5 x 20 sec hold -Knee flex/ext 2 x 10 cuing for increasing speed. -Sit to stand from water bench x 10.  VC  for execution/proper hand placement and weight shift -Flutter kicking 2 trials of 30 reps, 1 trial kicking from hip, second knee.  Pt with difficulty maintaining position on bench submerged to chest due to inability to align COB and COG -flutter kicking seated on 4 th step x 30 reps. Sit-stand from 3rd step then on 4th step increasing body weight, instructing on weight shift and proper technique.   Standing  - step ups  leading R/L x 10 reps on bottom step with one ue support on handrail Alternate  step pattern stair climbing x 5 steps x 2.   VC and demonstration for technique and proper weight shift. Pt with >difficulty using rle vs left due to old knee injury    Gait training 3 ft vc, tc  and demonstration on proper gait. 4 widths forward 2 backward.  Cueing for increased step length.   Pt requires buoyancy for support and to offload joints with strengthening exercises. Viscosity of the water is needed for resistance of strengthening; water current perturbations provides challenge to standing balance unsupported, requiring increased core activation.         Plan - 08/26/21 1816     Clinical Impression Statement Pt with resolving bruising about left eye from fall.  No other reported injuries or increase in pain. Able to progress pt to amb throughou pool without ue support. Gait training for improved pattern.  Worked on sit<>stand transfer for proper weight shift and executioin safely.    PT Treatment/Interventions ADLs/Self Care Home Management;Aquatic Therapy;Cryotherapy;Electrical Stimulation;Gait training;Ultrasound;Traction;Moist Heat;Stair training;Functional mobility training;Therapeutic activities;Therapeutic exercise;Balance training;Neuromuscular re-education;Manual techniques;Patient/family education;Passive range of motion;Dry needling;Taping    PT Home Exercise Plan [id number]             Patient will benefit from skilled therapeutic intervention in order to improve the following deficits and impairments:  Abnormal gait, Decreased range of motion, Difficulty walking, Decreased activity tolerance, Pain, Improper body mechanics, Impaired flexibility, Decreased balance, Decreased scar mobility, Decreased strength, Postural dysfunction  Visit Diagnosis: Pain in left hip  Muscle weakness (generalized)  Difficulty in walking, not elsewhere classified     Problem List Patient Active Problem List   Diagnosis Date Noted   Pain due to onychomycosis of toenails of both feet 08/22/2021   Intertrochanteric fracture of left hip ([place]) 06/24/2021   Closed left hip fracture, initial encounter ([place]) 06/20/2021   Hypokalemia 06/20/2021   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    Cognitive complaints 06/02/2016   Hypoxemic respiratory failure, chronic ([place]) 09/19/2015   MCI (mild cognitive impairment) 03/06/2015   Depression with anxiety 03/06/2015   Bullae 07/02/2014   History of MRSA infection 11/20/2012   Low back pain 07/07/2012   Insomnia 04/28/2012   Coronary Artery Disease 04/19/2012   Hyperlipidemia 04/19/2012   Anxiety 04/19/2012   Non-ST elevation myocardial infarction (NSTEMI), initial care episode ([place]) 03/28/2012   HYPERTENSION, BENIGN 06/12/2010   Dyspnea 06/12/2010   Prolonged QT interval 06/12/2010    Annamarie Major) Jes Costales MPT  08/26/2021, 6:19 PM  [facility] [address] [place], [place], [address] Phone: [phone]   Fax:  [phone]  Name: Carrie Chung MRN: [id number] Date of Birth: [birth year]

## 2021-08-27 ENCOUNTER — Telehealth: Payer: Self-pay

## 2021-08-27 NOTE — Telephone Encounter (Signed)
Patient had to have her medicare wellness visit canceled due to phone line issues at the office.  Please reschedule.

## 2021-08-28 ENCOUNTER — Ambulatory Visit (HOSPITAL_BASED_OUTPATIENT_CLINIC_OR_DEPARTMENT_OTHER): Payer: Medicare Other | Admitting: Physical Therapy

## 2021-08-28 ENCOUNTER — Encounter (HOSPITAL_BASED_OUTPATIENT_CLINIC_OR_DEPARTMENT_OTHER): Payer: Self-pay | Admitting: Physical Therapy

## 2021-08-28 ENCOUNTER — Other Ambulatory Visit: Payer: Self-pay

## 2021-08-28 DIAGNOSIS — M25552 Pain in left hip: Secondary | ICD-10-CM

## 2021-08-28 DIAGNOSIS — M6281 Muscle weakness (generalized): Secondary | ICD-10-CM

## 2021-08-28 DIAGNOSIS — R262 Difficulty in walking, not elsewhere classified: Secondary | ICD-10-CM

## 2021-08-28 NOTE — Therapy (Signed)
[place], [place], [address] Phone: [phone]   Fax:  [phone]  Physical Therapy Treatment  Patient Details  Name: Carrie Chung MRN: [id number] Date of Birth: 1941/12/01 Referring Provider (PT): Edmonia Lynch, MD   Encounter Date: 08/28/2021   PT End of Session - 08/28/21 0957     Visit Number 5    Number of Visits 33    Date for PT Re-Evaluation 12/05/21    PT Start Time 0952    PT Stop Time 1035    PT Time Calculation (min) 43 min             Past Medical History:  Diagnosis Date   Atrial tachycardia ([place])    a. noted at cardiac rehab 5/13; event monitor ordered to assess for AFib   CAD (coronary artery disease)    a. NSTEMI 03/2012 ([place] 03/27/12: pLAD 30%, mLAD 99%, mCFX 95%, mRCA 99% with thrombus, EF 65%);  s/p PTCA/DESx1 to prox-mid RCA 03/27/12 urgently in setting of hypotension/bradycardia, with staged PTCA/Evolve study stent to mid LAD & PTCA/Evolve study stent to prox LCx 03/29/12 ;   echo 03/28/12:EF 60%, Aortic sclerosis without AS, mild RVE, mild reduced RVSF     Endometrial polyp    HTN (hypertension)    Hyperlipidemia    MI, old    Sleep apnea     Past Surgical History:  Procedure Laterality Date   APPENDECTOMY  80 years old   CAROTID STENT  04/2012   x3   CESAREAN SECTION  1984   CHOLECYSTECTOMY  1978   INTRAMEDULLARY (IM) NAIL INTERTROCHANTERIC Left 06/20/2021   Procedure: INTRAMEDULLARY (IM) NAIL INTERTROCHANTRIC;  Surgeon: Renette Butters, MD;  Location: [place];  Service: Orthopedics;  Laterality: Left;   LEFT HEART CATHETERIZATION WITH CORONARY ANGIOGRAM N/A 03/27/2012   Procedure: LEFT HEART CATHETERIZATION WITH CORONARY ANGIOGRAM;  Surgeon: Burnell Blanks, MD;  Location: [facility] CATH LAB;  Service: Cardiovascular;  Laterality: N/A;   PERCUTANEOUS CORONARY STENT INTERVENTION (PCI-S) N/A 03/27/2012   Procedure: PERCUTANEOUS CORONARY STENT INTERVENTION (PCI-S);   Surgeon: Burnell Blanks, MD;  Location: [facility] CATH LAB;  Service: Cardiovascular;  Laterality: N/A;   PERCUTANEOUS CORONARY STENT INTERVENTION (PCI-S) N/A 03/29/2012   Procedure: PERCUTANEOUS CORONARY STENT INTERVENTION (PCI-S);  Surgeon: [name], MD;  Location: [facility] CATH LAB;  Service: Cardiovascular;  Laterality: N/A;   TONSILLECTOMY  80 years old    There were no vitals filed for this visit.   Subjective Assessment - 08/28/21 1325     Subjective "I was very tired and soar after last session.  Went right to bed when I got home"    Patient is accompained by: Family member    Currently in Pain? Yes    Pain Score 4     Pain Onset More than a month ago    Pain Frequency Intermittent                                          PT Short Term Goals - 08/15/21 2038       PT SHORT TERM GOAL #1   Title pt will Chung hip flexion, abd and extension strength at least 4/5    Baseline 3-/5 at eval    Time 4    Period Weeks    Status New    Target Date 09/12/21  PT SHORT TERM GOAL #2   Title pt will be safe in household ambulation with SPC    Baseline requires RW at eval    Time 4    Period Weeks    Status New    Target Date 09/12/21      PT SHORT TERM GOAL #3   Title pt will be able to navigate stairs with use of hand rail and SPC to go upstairs in her home    Baseline has done 7 steps with HHPT    Time 4    Period Weeks    Status New    Target Date 09/12/21               PT Long Term Goals - 08/15/21 2050       PT LONG TERM GOAL #1   Title Pt will be able to play with her grandchildren without limitations    Baseline unable at eval    Time 16    Period Weeks    Status New    Target Date 12/05/21      PT LONG TERM GOAL #2   Title pt will be able to ambulate for at least 1 hour without AD for functional ambulation    Baseline unable and requires RW at eval    Time 16    Period Weeks    Status New    Target Date 12/05/21       PT LONG TERM GOAL #3   Title pt will be able to stand and cook large meal without rest break    Baseline unable at eval    Time 16    Period Weeks    Status New    Target Date 12/05/21      PT LONG TERM GOAL #4   Title pt will d/c to proper long term program including nustep for cardiac challenges    Baseline unable at eval    Time 16    Period Weeks    Status New    Target Date 12/05/21                 Pt seen for aquatic therapy today.  Treatment took place in water 3.25-4.8 ft in depth at the [facility] pool. Temp of water was 91.  Pt entered/exited the pool via stairs step to pattern CGA with bilat rail.     Warm up forward, backward and side stepping/walking cues for increased step length, increased speed, hand placement to increase resistance. 4 laps each.    Seated -gastroc, hamstrings, adductors and glute on wall and sitting on bench of pool 5 x 20 sec hold.   Standing  - step ups  leading R/L x 10 reps on bottom step with one ue support on handrail Alternate step pattern stair climbing x 5 steps x 2.   VC and demonstration for technique and proper weight shift.       Gait training 3 ft , no ue support: vc, tc and demonstration on proper gait. 4 widths forward 2 backward.   Lunges x 4 lengths forward and backward without ue support. Pushing kick board, increased step length and speed x 4 lengths. VC and Chung for posture, tightneing of abdominals.Cueing for increased step length. Balance challenges feet togther 2 trials of 10 seconds then 2 trials with vision eliminated Tandem stance: pt unable to gain balance  Pt given 3 recovery periods.   Pt requires buoyancy for support and to offload joints  with strengthening exercises. Viscosity of the water is needed for resistance of strengthening; water current perturbations provides challenge to standing balance unsupported, requiring increased core activation.          Plan - 08/28/21 1327      Clinical Impression Statement Decreased intensity of session today. Allowed for added recovery periods. Focused on gait and building endurance. With cuing pt gait normailzes submerged ~70%.  Note: pt with over correction towards right slightly losing balance but recovering indep 3-4 times.  Pt has progressed to no UE support with amb in pool.    PT Treatment/Interventions ADLs/Self Care Home Management;Aquatic Therapy;Cryotherapy;Electrical Stimulation;Gait training;Ultrasound;Traction;Moist Heat;Stair training;Functional mobility training;Therapeutic activities;Therapeutic exercise;Balance training;Neuromuscular re-education;Manual techniques;Patient/family education;Passive range of motion;Dry needling;Taping             Patient will benefit from skilled therapeutic intervention in order to improve the following deficits and impairments:  Abnormal gait, Decreased range of motion, Difficulty walking, Decreased activity tolerance, Pain, Improper body mechanics, Impaired flexibility, Decreased balance, Decreased scar mobility, Decreased strength, Postural dysfunction  Visit Diagnosis: Pain in left hip  Muscle weakness (generalized)  Difficulty in walking, not elsewhere classified     Problem List Patient Active Problem List   Diagnosis Date Noted   Pain due to onychomycosis of toenails of both feet 08/22/2021   Intertrochanteric fracture of left hip ([place]) 06/24/2021   Closed left hip fracture, initial encounter ([place]) 06/20/2021   Hypokalemia 06/20/2021   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   Cognitive complaints 06/02/2016   Hypoxemic respiratory failure, chronic ([place]) 09/19/2015   MCI (mild cognitive impairment) 03/06/2015   Depression with anxiety 03/06/2015   Bullae 07/02/2014   History of MRSA infection 11/20/2012   Low back pain 07/07/2012   Insomnia 04/28/2012   Coronary Artery Disease 04/19/2012   Hyperlipidemia 04/19/2012   Anxiety 04/19/2012   Non-ST elevation myocardial  infarction (NSTEMI), initial care episode ([place]) 03/28/2012   HYPERTENSION, BENIGN 06/12/2010   Dyspnea 06/12/2010   Prolonged QT interval 06/12/2010    Annamarie Major) Jesse Hirst MPT  08/28/2021, 1:32 PM  [facility] [address] [place], [place], [address] Phone: [phone]   Fax:  [phone]  Name: Carrie Chung MRN: [id number] Date of Birth: 1941-08-30

## 2021-08-29 NOTE — Telephone Encounter (Signed)
PT called in today to advise of 2 things:  The first being that the PT is still waiting to hear more about her Medicare Wellness Visit she was suppose to have had Wednesday but did not due to phone issues. She would still like to have this and have it be a telephone call.  The second is the PT would like to have a hospital followup in regards to her hospital stay for her fractured femur.

## 2021-09-02 ENCOUNTER — Ambulatory Visit (HOSPITAL_BASED_OUTPATIENT_CLINIC_OR_DEPARTMENT_OTHER): Payer: Self-pay | Admitting: Physical Therapy

## 2021-09-03 ENCOUNTER — Other Ambulatory Visit: Payer: Self-pay

## 2021-09-03 ENCOUNTER — Encounter: Payer: Self-pay | Admitting: Internal Medicine

## 2021-09-03 ENCOUNTER — Ambulatory Visit (INDEPENDENT_AMBULATORY_CARE_PROVIDER_SITE_OTHER): Payer: Medicare Other | Admitting: Internal Medicine

## 2021-09-03 VITALS — Ht 61.0 in

## 2021-09-03 DIAGNOSIS — Z1211 Encounter for screening for malignant neoplasm of colon: Secondary | ICD-10-CM

## 2021-09-03 DIAGNOSIS — Z0000 Encounter for general adult medical examination without abnormal findings: Secondary | ICD-10-CM | POA: Diagnosis not present

## 2021-09-03 DIAGNOSIS — Z780 Asymptomatic menopausal state: Secondary | ICD-10-CM

## 2021-09-03 DIAGNOSIS — Z800 Family history of malignant neoplasm of digestive organs: Secondary | ICD-10-CM

## 2021-09-03 DIAGNOSIS — E559 Vitamin D deficiency, unspecified: Secondary | ICD-10-CM | POA: Diagnosis not present

## 2021-09-03 NOTE — Patient Instructions (Signed)
Health Maintenance, Female Adopting a healthy lifestyle and getting preventive care are important in promoting health and wellness. Ask your health care provider about: The right schedule for you to have regular tests and exams. Things you can do on your own to prevent diseases and keep yourself healthy. What should I know about diet, weight, and exercise? Eat a healthy diet  Eat a diet that includes plenty of vegetables, fruits, low-fat dairy products, and lean protein. Do not eat a lot of foods that are high in solid fats, added sugars, or sodium. Maintain a healthy weight Body mass index (BMI) is used to identify weight problems. It estimates body fat based on height and weight. Your health care provider can help determine your BMI and help you achieve or maintain a healthy weight. Get regular exercise Get regular exercise. This is one of the most important things you can do for your health. Most adults should: Exercise for at least 150 minutes each week. The exercise should increase your heart rate and make you sweat (moderate-intensity exercise). Do strengthening exercises at least twice a week. This is in addition to the moderate-intensity exercise. Spend less time sitting. Even light physical activity can be beneficial. Watch cholesterol and blood lipids Have your blood tested for lipids and cholesterol at 81 years of age, then have this test every 5 years. Have your cholesterol levels checked more often if: Your lipid or cholesterol levels are high. You are older than 81 years of age. You are at high risk for heart disease. What should I know about cancer screening? Depending on your health history and family history, you may need to have cancer screening at various ages. This may include screening for: Breast cancer. Cervical cancer. Colorectal cancer. Skin cancer. Lung cancer. What should I know about heart disease, diabetes, and high blood pressure? Blood pressure and heart  disease High blood pressure causes heart disease and increases the risk of stroke. This is more likely to develop in people who have high blood pressure readings, are of African descent, or are overweight. Have your blood pressure checked: Every 3-5 years if you are 18-39 years of age. Every year if you are 40 years old or older. Diabetes Have regular diabetes screenings. This checks your fasting blood sugar level. Have the screening done: Once every three years after age 40 if you are at a normal weight and have a low risk for diabetes. More often and at a younger age if you are overweight or have a high risk for diabetes. What should I know about preventing infection? Hepatitis B If you have a higher risk for hepatitis B, you should be screened for this virus. Talk with your health care provider to find out if you are at risk for hepatitis B infection. Hepatitis C Testing is recommended for: Everyone born from 1945 through 1965. Anyone with known risk factors for hepatitis C. Sexually transmitted infections (STIs) Get screened for STIs, including gonorrhea and chlamydia, if: You are sexually active and are younger than 81 years of age. You are older than 81 years of age and your health care provider tells you that you are at risk for this type of infection. Your sexual activity has changed since you were last screened, and you are at increased risk for chlamydia or gonorrhea. Ask your health care provider if you are at risk. Ask your health care provider about whether you are at high risk for HIV. Your health care provider may recommend a prescription medicine   to help prevent HIV infection. If you choose to take medicine to prevent HIV, you should first get tested for HIV. You should then be tested every 3 months for as long as you are taking the medicine. Pregnancy If you are about to stop having your period (premenopausal) and you may become pregnant, seek counseling before you get  pregnant. Take 400 to 800 micrograms (mcg) of folic acid every day if you become pregnant. Ask for birth control (contraception) if you want to prevent pregnancy. Osteoporosis and menopause Osteoporosis is a disease in which the bones lose minerals and strength with aging. This can result in bone fractures. If you are 65 years old or older, or if you are at risk for osteoporosis and fractures, ask your health care provider if you should: Be screened for bone loss. Take a calcium or vitamin D supplement to lower your risk of fractures. Be given hormone replacement therapy (HRT) to treat symptoms of menopause. Follow these instructions at home: Lifestyle Do not use any products that contain nicotine or tobacco, such as cigarettes, e-cigarettes, and chewing tobacco. If you need help quitting, ask your health care provider. Do not use street drugs. Do not share needles. Ask your health care provider for help if you need support or information about quitting drugs. Alcohol use Do not drink alcohol if: Your health care provider tells you not to drink. You are pregnant, may be pregnant, or are planning to become pregnant. If you drink alcohol: Limit how much you use to 0-1 drink a day. Limit intake if you are breastfeeding. Be aware of how much alcohol is in your drink. In the [place] one drink equals one 12 oz bottle of beer (355 mL), one 5 oz glass of wine (148 mL), or one 1 oz glass of hard liquor (44 mL). General instructions Schedule regular health, dental, and eye exams. Stay current with your vaccines. Tell your health care provider if: You often feel depressed. You have ever been abused or do not feel safe at home. Summary Adopting a healthy lifestyle and getting preventive care are important in promoting health and wellness. Follow your health care provider's instructions about healthy diet, exercising, and getting tested or screened for diseases. Follow your health care provider's  instructions on monitoring your cholesterol and blood pressure. This information is not intended to replace advice given to you by your health care provider. Make sure you discuss any questions you have with your health care provider. Document Revised: 02/14/2021 Document Reviewed: 11/30/2018 Elsevier Patient Education  2022 [employer] Inc.  

## 2021-09-03 NOTE — Progress Notes (Signed)
I connected with  Carrie Chung on 09/03/21 by a video enabled telemedicine application and verified that I am speaking with the correct person using two identifiers.   I discussed the limitations of evaluation and management by telemedicine. The patient expressed understanding and agreed to proceed.  Location of patient: Home   Location of provider: Office       Persons participating in visit: Carrie Chung and Carrie Chung, CMA  Subjective:   Carrie Chung is a 80 y.o. female who presents for Medicare Annual (Subsequent) preventive examination.  Review of Systems    Defer to PCP       Objective:    Today's Vitals   09/03/21 0844  Height: '5\' 1"'$  (1.549 m)   Body mass index is 30.49 kg/m.  Advanced Directives 08/15/2021 06/24/2021 06/19/2021 07/16/2015 12/14/2014 07/03/2014 04/30/2014  Does Patient Have a Medical Advance Directive? Yes No No No Yes Patient does not have advance directive Patient has advance directive, copy not in chart  Type of Advance Directive [place];Living will - Living will  Does patient want to make changes to medical advance directive? - - - - No - Patient declined - No change requested  Copy of Healthcare Power of Attorney in Chart? - - - - No - copy requested - Copy requested from family  Would patient like information on creating a medical advance directive? - No - Patient declined No - Patient declined - Yes - Educational materials given - -  Pre-existing out of facility DNR order (yellow form or pink MOST form) - - - - - No -    Current Medications (verified) Outpatient Encounter Medications as of 09/03/2021  Medication Sig   acetaminophen (TYLENOL) 325 MG tablet Take 1-2 tablets (325-650 mg total) by mouth every 4 (four) hours as needed for mild pain.   ALPRAZolam (XANAX) 0.5 MG tablet Take 1 tablet (0.5 mg total) by mouth 2 (two) times daily.   amLODipine (NORVASC) 5 MG tablet  TAKE 1 TABLET BY MOUTH EVERY DAY   aspirin 81 MG tablet Take 1 tablet (81 mg total) by mouth daily.   Calcium Carbonate-Vitamin D 600-400 MG-UNIT tablet Take 1 tablet by mouth every evening.   clopidogrel (PLAVIX) 75 MG tablet One tab daily   docusate sodium (COLACE) 100 MG capsule Take 1 capsule (100 mg total) by mouth 2 (two) times daily.   esomeprazole (NEXIUM) 40 MG capsule Take 1 capsule (40 mg total) by mouth daily at 12 noon.   folic acid-pyridoxine-cyancobalamin (FOLBIC) 2.5-25-2 MG TABS tablet Take 1 tablet by mouth daily.   HYDROcodone-acetaminophen (NORCO/VICODIN) 5-325 MG tablet Take 1 tablet by mouth 2 (two) times daily as needed.   methocarbamol (ROBAXIN) 500 MG tablet Take 1 tablet (500 mg total) by mouth every 6 (six) hours as needed for muscle spasms.   metoprolol tartrate (LOPRESSOR) 25 MG tablet Take 1 tablet (25 mg total) by mouth 2 (two) times daily.   Multiple Vitamin (MULTIVITAMIN WITH MINERALS) TABS tablet Take 1 tablet by mouth daily.   nitroGLYCERIN (NITROSTAT) 0.4 MG SL tablet Place 1 tablet (0.4 mg total) under the tongue every 5 (five) minutes as needed for chest pain.   oxyCODONE (OXY IR/ROXICODONE) 5 MG immediate release tablet Take 1 tablet (5 mg total) by mouth every 4 (four) hours as needed for severe pain or moderate pain.   OXYGEN Inhale into the lungs. Per patient using 2 liters at  night.   polyethylene glycol (MIRALAX / GLYCOLAX) 17 g packet Take 17 g by mouth daily as needed for mild constipation.   prochlorperazine (COMPAZINE) 5 MG tablet Take 1 tablet (5 mg total) by mouth every 6 (six) hours as needed for nausea or vomiting.   rosuvastatin (CRESTOR) 20 MG tablet TAKE 1 TABLET(20 MG) BY MOUTH DAILY   venlafaxine XR (EFFEXOR-XR) 75 MG 24 hr capsule Take 1 capsule (75 mg total) by mouth daily with breakfast.   No facility-administered encounter medications on file as of 09/03/2021.    Allergies (verified) Penicillins, Sulfonamide derivatives, Lactose  intolerance (gi), and Sulfamethoxazole   History: Past Medical History:  Diagnosis Date   Atrial tachycardia ([place])    a. noted at cardiac rehab 5/13; event monitor ordered to assess for AFib   CAD (coronary artery disease)    a. NSTEMI 03/2012 ([place] 03/27/12: pLAD 30%, mLAD 99%, mCFX 95%, mRCA 99% with thrombus, EF 65%);  s/p PTCA/DESx1 to prox-mid RCA 03/27/12 urgently in setting of hypotension/bradycardia, with staged PTCA/Evolve study stent to mid LAD & PTCA/Evolve study stent to prox LCx 03/29/12 ;   echo 03/28/12:EF 60%, Aortic sclerosis without AS, mild RVE, mild reduced RVSF     Endometrial polyp    HTN (hypertension)    Hyperlipidemia    MI, old    Sleep apnea    Past Surgical History:  Procedure Laterality Date   APPENDECTOMY  80 years old   CAROTID STENT  04/2012   x3   CESAREAN SECTION  1984   CHOLECYSTECTOMY  1978   INTRAMEDULLARY (IM) NAIL INTERTROCHANTERIC Left 06/20/2021   Procedure: INTRAMEDULLARY (IM) NAIL INTERTROCHANTRIC;  Surgeon: Renette Butters, MD;  Location: [facility];  Service: Orthopedics;  Laterality: Left;   LEFT HEART CATHETERIZATION WITH CORONARY ANGIOGRAM N/A 03/27/2012   Procedure: LEFT HEART CATHETERIZATION WITH CORONARY ANGIOGRAM;  Surgeon: Burnell Blanks, MD;  [facility] C[facility] LAB;  Service: Cardiovascular;  Laterality: N/A;   PERCUTANEOUS CORONARY STENT INTERVENTION (PCI-S) N/A 03/27/2012   Procedure: PERCUTANEOUS CORONARY STENT INTERVENTION (PCI-S);  Surgeon: Burnell Blanks, MD;  Location: [facility] CATH LAB;  Service: Cardiovascular;  Laterality: N/A;   PERCUTANEOUS CORONARY STENT INTERVENTION (PCI-S) N/A 03/29/2012   Procedure: PERCUTANEOUS CORONARY STENT INTERVENTION (PCI-S);  Surgeon: [name], MD;  Location: [facility] CATH LAB;  Service: Cardiovascular;  Laterality: N/A;   TONSILLECTOMY  80 years old   Family History  Problem Relation Age of Onset   Coronary artery disease Mother        CABG at age 37, PPM   Colon cancer Father    Cancer - Other  Father        larnyx   Social History   Socioeconomic History   Marital status: Married    Spouse name: Not on file   Number of children: 1   Years of education: Coll +   Highest education level: Not on file  Occupational History   Occupation: Retired Marine scientist  Tobacco Use   Smoking status: Former    Types: Cigarettes    Quit date: 04/19/1980    Years since quitting: 41.4   Smokeless tobacco: Never  Substance and Sexual Activity   Alcohol use: Yes    Alcohol/week: 20.0 standard drinks    Types: 20 Glasses of wine per week    Comment: socially   Drug use: No   Sexual activity: Never  Other Topics Concern   Not on file  Social History Narrative   Caffeine 2 cups tea.  Social Determinants of Health   Financial Resource Strain: Low Risk    Difficulty of Paying Living Expenses: Not hard at all  Food Insecurity: No Food Insecurity   Worried About Charity fundraiser in the Last Year: Never true   Bath in the Last Year: Never true  Transportation Needs: No Transportation Needs   Lack of Transportation (Medical): No   Lack of Transportation (Non-Medical): No  Physical Activity: Inactive   Days of Exercise per Week: 0 days   Minutes of Exercise per Session: 0 min  Stress: No Stress Concern Present   Feeling of Stress : Only a little  Social Connections: Engineer, building services of Communication with Friends and Family: Twice a week   Frequency of Social Gatherings with Friends and Family: Twice a week   Attends Religious Services: 1 to 4 times per year   Active Member of [employer] or Organizations: Yes   Attends Archivist Meetings: 1 to 4 times per year   Marital Status: Married    Tobacco Counseling Counseling given: Not Answered   Clinical Intake:  Pre-visit preparation completed: Yes        Diabetes: No  How often do you need to have someone help you when you read instructions, pamphlets, or other written materials from your doctor  or pharmacy?: 1 - Never  Diabetic?No  Interpreter Needed?: No  Information entered by :: S.Levens, CMA   Activities of Daily Living In your present state of health, do you have any difficulty performing the following activities: 06/24/2021 06/20/2021  Hearing? N N  Vision? N N  Difficulty concentrating or making decisions? N N  Walking or climbing stairs? Y Y  Dressing or bathing? Y N  Doing errands, shopping? N N  Some recent data might be hidden    Patient Care Team: Amarien Carne, Cresenciano Lick, MD as PCP - General (Internal Medicine) [name], MD as PCP - Cardiology (Cardiology) Endya Austin, Cresenciano Lick, MD (Internal Medicine)  Indicate any recent Medical Services you may have received from other than [facility] providers in the past year (date may be approximate).     Assessment:   This is a routine wellness examination for Deon.  Hearing/Vision screen No results found.  Dietary issues and exercise activities discussed:     Goals Addressed   None    Depression Screen PHQ 2/9 Scores 09/03/2021 08/19/2020 08/19/2020 08/14/2019 08/09/2018 06/28/2017 03/30/2017  PHQ - 2 Score 2 0 0 2 2 0 0  PHQ- 9 Score 3 0 - 6 8 - -    Fall Risk Fall Risk  09/03/2021 08/19/2020 08/14/2019 08/09/2018 07/20/2018  Falls in the past year? '1 1 1 '$ Yes Yes  Comment - - - - Emmi Telephone Survey: data to providers prior to load  Number falls in past yr: 0 0 '1 1 1  '$ Comment - - - - Emmi Telephone Survey Actual Response = 1  Injury with Fall? 0 0 0 Yes Yes  Comment - - - FRACTURED L3 -  Risk for fall due to : - - Impaired vision - -  Follow up - Falls evaluation completed Falls evaluation completed - -    FALL RISK PREVENTION PERTAINING TO THE HOME:  Any stairs in or around the home? Yes  If so, are there any without handrails? Yes  Home free of loose throw rugs in walkways, pet beds, electrical cords, etc? No  Adequate lighting in your home to reduce risk of  falls? Yes   ASSISTIVE DEVICES UTILIZED TO PREVENT  FALLS:  Life alert? No  Use of a cane, walker or w/c?  Yes, recent femur break Grab bars in the bathroom? Yes  Shower chair or bench in shower? Yes  Elevated toilet seat or a handicapped toilet? Yes   TIMED UP AND GO:  Was the test performed? No .  Length of time to ambulate 10 feet: n/a sec.   Gait slow and steady with assistive device  Cognitive Function:   Montreal Cognitive Assessment  12/29/2017 06/28/2017 06/02/2016  Visuospatial/ Executive (0/5) '4 5 4  '$ Naming (0/3) '3 3 3  '$ Attention: Read list of digits (0/2) '2 2 2  '$ Attention: Read list of letters (0/1) '1 1 1  '$ Attention: Serial 7 subtraction starting at 100 (0/3) '3 3 3  '$ Language: Repeat phrase (0/2) '1 2 2  '$ Language : Fluency (0/1) '1 1 1  '$ Abstraction (0/2) '2 2 2  '$ Delayed Recall (0/5) '5 4 4  '$ Orientation (0/6) '6 6 6  '$ Total '28 29 28  '$ Adjusted Score (based on education) - - 28   6CIT Screen 09/03/2021  What Year? 0 points  What month? 0 points  What time? 0 points  Count back from 20 0 points  Months in reverse 0 points  Repeat phrase 0 points  Total Score 0    Immunizations Immunization History  Administered Date(s) Administered   Influenza Split 09/09/2013   Influenza, High Dose Seasonal PF 10/23/2016, 08/26/2017, 09/08/2018, 08/30/2019   Influenza,inj,Quad PF,6+ Mos 09/16/2015   Influenza-Unspecified 09/20/2014, 08/26/2017, 08/21/2018   Moderna Sars-Covid-2 Vaccination 01/04/2020, 02/01/2020, 10/14/2020   Pneumococcal Conjugate-13 07/16/2015   Pneumococcal Polysaccharide-23 03/28/2012   Tdap 04/28/2012    TDAP status: Up to date  Flu Vaccine status: Due, Education has been provided regarding the importance of this vaccine. Advised may receive this vaccine at local pharmacy or Health Dept. Aware to provide a copy of the vaccination record if obtained from local pharmacy or Health Dept. Verbalized acceptance and understanding.  Pneumococcal vaccine status: Up to date  Covid-19 vaccine status: Completed  vaccines  Qualifies for Shingles Vaccine? Yes   Zostavax completed Yes   Shingrix Completed?: No.    Education has been provided regarding the importance of this vaccine. Patient has been advised to call insurance company to determine out of pocket expense if they have not yet received this vaccine. Advised may also receive vaccine at local pharmacy or Health Dept. Verbalized acceptance and understanding.  Screening Tests Health Maintenance  Topic Date Due   COVID-19 Vaccine (4 - Booster for Moderna series) 09/19/2021 (Originally 02/14/2021)   Zoster Vaccines- Shingrix (1 of 2) 12/03/2021 (Originally [birth year]/1990)   INFLUENZA VACCINE  03/20/2022 (Originally 07/21/2021)   TETANUS/TDAP  04/28/2022   DEXA SCAN  Completed   PNA vac Low Risk Adult  Completed   HPV VACCINES  Aged Out    Health Maintenance  There are no preventive care reminders to display for this patient.  Mammogram status: Ordered  . Pt provided with contact info and advised to call to schedule appt.   Bone Density status: Ordered  . Pt provided with contact info and advised to call to schedule appt.  Lung Cancer Screening: (Low Dose CT Chest recommended if Age 42-80 years, 30 pack-year currently smoking OR have quit w/in 15years.) does not qualify.   Lung Cancer Screening Referral: n/a  Additional Screening:  Hepatitis C Screening: does not qualify; Completed n/a  Vision Screening: Recommended annual ophthalmology exams  for early detection of glaucoma and other disorders of the eye. Is the patient up to date with their annual eye exam?  Yes  Who is the provider or what is the name of the office in which the patient attends annual eye exams? [facility] If pt is not established with a provider, would they like to be referred to a provider to establish care? No .   Dental Screening: Recommended annual dental exams for proper oral hygiene  Community Resource Referral / Chronic Care Management: CRR required  this visit?  No   CCM required this visit?  No      Plan:     I have personally reviewed and noted the following in the patient's chart:   Medical and social history Use of alcohol, tobacco or illicit drugs  Current medications and supplements including opioid prescriptions.  Functional ability and status Nutritional status Physical activity Advanced directives List of other physicians Hospitalizations, surgeries, and ER visits in previous 12 months Vitals Screenings to include cognitive, depression, and falls Referrals and appointments  In addition, I have reviewed and discussed with patient certain preventive protocols, quality metrics, and best practice recommendations. A written personalized care plan for preventive services as well as general preventive health recommendations were provided to patient.     Amado Coe, [place]   09/03/2021   Nurse Notes: Non face to face 30 minutes visit.    Ms. Noto , Thank you for taking time to come for your Medicare Wellness Visit. I appreciate your ongoing commitment to your health goals. Please review the following plan we discussed and let me know if I can assist you in the future.   These are the goals we discussed:  Goals   None     This is a list of the screening recommended for you and due dates:  Health Maintenance  Topic Date Due   COVID-19 Vaccine (4 - Booster for Moderna series) 09/19/2021*   Zoster (Shingles) Vaccine (1 of 2) 12/03/2021*   Flu Shot  03/20/2022*   Tetanus Vaccine  04/28/2022   DEXA scan (bone density measurement)  Completed   Pneumonia vaccines  Completed   HPV Vaccine  Aged Out  *Topic was postponed. The date shown is not the original due date.     I have personally reviewed this note. She could not weigh at home as she is recovering from a fracture.MJB,MD

## 2021-09-03 NOTE — Telephone Encounter (Signed)
Appointment scheduled and done.

## 2021-09-04 ENCOUNTER — Ambulatory Visit (HOSPITAL_BASED_OUTPATIENT_CLINIC_OR_DEPARTMENT_OTHER): Payer: Self-pay | Admitting: Physical Therapy

## 2021-09-09 ENCOUNTER — Ambulatory Visit (HOSPITAL_BASED_OUTPATIENT_CLINIC_OR_DEPARTMENT_OTHER): Payer: Medicare Other | Admitting: Physical Therapy

## 2021-09-09 ENCOUNTER — Other Ambulatory Visit: Payer: Self-pay

## 2021-09-09 ENCOUNTER — Telehealth: Payer: Self-pay | Admitting: Internal Medicine

## 2021-09-09 DIAGNOSIS — R262 Difficulty in walking, not elsewhere classified: Secondary | ICD-10-CM

## 2021-09-09 DIAGNOSIS — M25552 Pain in left hip: Secondary | ICD-10-CM

## 2021-09-09 DIAGNOSIS — M6281 Muscle weakness (generalized): Secondary | ICD-10-CM | POA: Diagnosis not present

## 2021-09-09 NOTE — Telephone Encounter (Signed)
Carrie Chung [phone]  [place] called to verify an appointment she has in October. She said it was for a hospital follow up from her surgery. I let her know we would not do a follow up that far out from her surgery in July. I ask her what she needs to be seen for and she said to discuss her medications and Insomnia/Anxiety. That her therapist suggested she see you. She stated her medications had been all messed up since her surgery, however she would discuss pain medicine with ortho and heart medicine with cardiologist.

## 2021-09-09 NOTE — Therapy (Signed)
[facility] [address] [place], [place], [address] Phone: [phone]   Fax:  [phone]  Physical Therapy Treatment  Patient Details  Name: Carrie Chung MRN: [id number] Date of Birth: September 01, 1941 Referring Provider (PT): Edmonia Lynch, MD   Encounter Date: 09/09/2021   PT End of Session - 09/09/21 1139     Number of Visits 33    Date for PT Re-Evaluation 12/05/21    PT Start Time 1116    PT Stop Time 1200    PT Time Calculation (min) 44 min             Past Medical History:  Diagnosis Date   Atrial tachycardia ([place])    a. noted at cardiac rehab 5/13; event monitor ordered to assess for AFib   CAD (coronary artery disease)    a. NSTEMI 03/2012 ([place] 03/27/12: pLAD 30%, mLAD 99%, mCFX 95%, mRCA 99% with thrombus, EF 65%);  s/p PTCA/DESx1 to prox-mid RCA 03/27/12 urgently in setting of hypotension/bradycardia, with staged PTCA/Evolve study stent to mid LAD & PTCA/Evolve study stent to prox LCx 03/29/12 ;   echo 03/28/12:EF 60%, Aortic sclerosis without AS, mild RVE, mild reduced RVSF     Endometrial polyp    HTN (hypertension)    Hyperlipidemia    MI, old    Sleep apnea     Past Surgical History:  Procedure Laterality Date   APPENDECTOMY  80 years old   CAROTID STENT  04/2012   x3   CESAREAN SECTION  1984   CHOLECYSTECTOMY  1978   INTRAMEDULLARY (IM) NAIL INTERTROCHANTERIC Left 06/20/2021   Procedure: INTRAMEDULLARY (IM) NAIL INTERTROCHANTRIC;  Surgeon: Renette Butters, MD;  Location: [facility];  Service: Orthopedics;  Laterality: Left;   LEFT HEART CATHETERIZATION WITH CORONARY ANGIOGRAM N/A 03/27/2012   Procedure: LEFT HEART CATHETERIZATION WITH CORONARY ANGIOGRAM;  Surgeon: Burnell Blanks, MD;  Location: [facility] C[facility] LAB;  Service: Cardiovascular;  Laterality: N/A;   PERCUTANEOUS CORONARY STENT INTERVENTION (PCI-S) N/A 03/27/2012   Procedure: PERCUTANEOUS CORONARY STENT INTERVENTION (PCI-S);  Surgeon: Burnell Blanks, MD;  Location: [facility] CATH LAB;  Service: Cardiovascular;  Laterality: N/A;   PERCUTANEOUS CORONARY STENT INTERVENTION (PCI-S) N/A 03/29/2012   Procedure: PERCUTANEOUS CORONARY STENT INTERVENTION (PCI-S);  Surgeon: [name], MD;  Location: [facility] CATH LAB;  Service: Cardiovascular;  Laterality: N/A;   TONSILLECTOMY  80 years old    There were no vitals filed for this visit.   Subjective Assessment - 09/09/21 1331     Subjective "was exposed to covid last week so had to miss my appointment.  I have been lazy  not doing much"    Currently in Pain? Yes    Pain Score 4     Pain Location Hip    Pain Orientation Left;Lateral    Pain Descriptors / Indicators Aching    Pain Type Surgical pain    Pain Onset More than a month ago    Pain Frequency Intermittent    Aggravating Factors  lying on left side    Effect of Pain on Daily Activities difficulty with ADL's and functional mobility                                          PT Short Term Goals - 08/15/21 2038       PT SHORT TERM GOAL #1   Title pt will  demo hip flexion, abd and extension strength at least 4/5    Baseline 3-/5 at eval    Time 4    Period Weeks    Status New    Target Date 09/12/21      PT SHORT TERM GOAL #2   Title pt will be safe in household ambulation with SPC    Baseline requires RW at eval    Time 4    Period Weeks    Status New    Target Date 09/12/21      PT SHORT TERM GOAL #3   Title pt will be able to navigate stairs with use of hand rail and SPC to go upstairs in her home    Baseline has done 7 steps with HHPT    Time 4    Period Weeks    Status New    Target Date 09/12/21               PT Long Term Goals - 08/15/21 2050       PT LONG TERM GOAL #1   Title Pt will be able to play with her grandchildren without limitations    Baseline unable at eval    Time 16    Period Weeks    Status New    Target Date 12/05/21      PT LONG TERM GOAL #2    Title pt will be able to ambulate for at least 1 hour without AD for functional ambulation    Baseline unable and requires RW at eval    Time 16    Period Weeks    Status New    Target Date 12/05/21      PT LONG TERM GOAL #3   Title pt will be able to stand and cook large meal without rest break    Baseline unable at eval    Time 16    Period Weeks    Status New    Target Date 12/05/21      PT LONG TERM GOAL #4   Title pt will d/c to proper long term program including nustep for cardiac challenges    Baseline unable at eval    Time 16    Period Weeks    Status New    Target Date 12/05/21           Pt seen for aquatic therapy today.  Treatment took place in water 3.25-4.8 ft in depth at the [facility] pool. Temp of water was 91.  Pt entered/exited the pool via stairs step to pattern supervisionwith bilat rail.     Warm up forward, backward and side stepping/walking cues for increased step length, increased speed, hand placement to increase resistance. 4 laps each without UE support.   Seated -gastroc, hamstrings, adductors and glute on wall and sitting on bench of pool 5 x 20 sec hold. Sit to stand from water bench 2 x 10 reps Hip hinges 2 x 10 reps using kick board    Standing  - step ups  leading R/L x 15 reps on bottom step with one ue support on handrail -Stair climbing alternating step pattern x 5 steps, vc and cga. Holding to handrails -High knee marching x 12 reps -hip flexor  and quad stretching R/L with min asst for proper execution 3 x 30 second hold each - hip extension x 10 -knee flex x 10 reps   Gait training Forward amb cueing for step length and arm swing. Pt  encouraged to feel pattern rather than over think.  Good heel strike and toe off. X 4 widths.     Pt requires buoyancy for support and to offload joints with strengthening exercises. Viscosity of the water is needed for resistance of strengthening; water current perturbations provides  challenge to standing balance unsupported, requiring increased core activation.         Plan - 09/09/21 1333     Clinical Impression Statement Pt presents without complaints today. Focused on strengthening and stretching of LLE. Left hip flexor found to be particularly tight, hip extension ~5 deg.  Able to improve with stretching and deep pressure towards ~10-15 deg.  She tolerates session without difficulty, some fatigue. Pt showing improvement in balance and core strength as evidenced by not using ue support with amb thruoghout sessiob today.    PT Treatment/Interventions ADLs/Self Care Home Management;Aquatic Therapy;Cryotherapy;Electrical Stimulation;Gait training;Ultrasound;Traction;Moist Heat;Stair training;Functional mobility training;Therapeutic activities;Therapeutic exercise;Balance training;Neuromuscular re-education;Manual techniques;Patient/family education;Passive range of motion;Dry needling;Taping    PT Next Visit Plan aquatics- gross strength and ROM of hip to tolerance, full biomechanical chain strength and flexibility    PT Home Exercise Plan [id number]             Patient will benefit from skilled therapeutic intervention in order to improve the following deficits and impairments:  Abnormal gait, Decreased range of motion, Difficulty walking, Decreased activity tolerance, Pain, Improper body mechanics, Impaired flexibility, Decreased balance, Decreased scar mobility, Decreased strength, Postural dysfunction  Visit Diagnosis: Pain in left hip  Muscle weakness (generalized)  Difficulty in walking, not elsewhere classified     Problem List Patient Active Problem List   Diagnosis Date Noted   Pain due to onychomycosis of toenails of both feet 08/22/2021   Intertrochanteric fracture of left hip ([place]) 06/24/2021   Closed left hip fracture, initial encounter ([place]) 06/20/2021   Hypokalemia 06/20/2021   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   Cognitive complaints 06/02/2016    Hypoxemic respiratory failure, chronic ([place]) 09/19/2015   MCI (mild cognitive impairment) 03/06/2015   Depression with anxiety 03/06/2015   Bullae 07/02/2014   History of MRSA infection 11/20/2012   Low back pain 07/07/2012   Insomnia 04/28/2012   Coronary Artery Disease 04/19/2012   Hyperlipidemia 04/19/2012   Anxiety 04/19/2012   Non-ST elevation myocardial infarction (NSTEMI), initial care episode ([place] [place]) 03/28/2012   HYPERTENSION, BENIGN 06/12/2010   Dyspnea 06/12/2010   Prolonged QT interval 06/12/2010    Vedia Pereyra, PT 09/09/2021, 1:37 PM  [facility] [address] [place], [place], [address] Phone: [phone]   Fax:  [phone]  Name: MIJA EFFERTZ MRN: [id number] Date of Birth: 10-18-1941

## 2021-09-11 ENCOUNTER — Other Ambulatory Visit: Payer: Self-pay

## 2021-09-11 ENCOUNTER — Encounter (HOSPITAL_BASED_OUTPATIENT_CLINIC_OR_DEPARTMENT_OTHER): Payer: Self-pay | Admitting: Physical Therapy

## 2021-09-11 ENCOUNTER — Ambulatory Visit (HOSPITAL_BASED_OUTPATIENT_CLINIC_OR_DEPARTMENT_OTHER): Payer: Medicare Other | Admitting: Physical Therapy

## 2021-09-11 DIAGNOSIS — R262 Difficulty in walking, not elsewhere classified: Secondary | ICD-10-CM

## 2021-09-11 DIAGNOSIS — M25552 Pain in left hip: Secondary | ICD-10-CM | POA: Diagnosis not present

## 2021-09-11 DIAGNOSIS — M6281 Muscle weakness (generalized): Secondary | ICD-10-CM | POA: Diagnosis not present

## 2021-09-11 NOTE — Therapy (Signed)
[facility] [place], [place], [address] Phone: [phone]   Fax:  [phone]  Physical Therapy Treatment  Patient Details  Name: Carrie Chung MRN: [id number] Date of Birth: 1941/06/25 Referring Provider (PT): Edmonia Lynch, MD   Encounter Date: 09/11/2021   PT End of Session - 09/11/21 1116     Visit Number 7    Number of Visits 33    Date for PT Re-Evaluation 12/05/21    PT Start Time 1117    PT Stop Time 1200    PT Time Calculation (min) 43 min    Equipment Utilized During Treatment Other (comment)    Activity Tolerance Patient tolerated treatment well    Behavior During Therapy [facility] for tasks assessed/performed             Past Medical History:  Diagnosis Date   Atrial tachycardia ([place])    a. noted at cardiac rehab 5/13; event monitor ordered to assess for AFib   CAD (coronary artery disease)    a. NSTEMI 03/2012 ([place] 03/27/12: pLAD 30%, mLAD 99%, mCFX 95%, mRCA 99% with thrombus, EF 65%);  s/p PTCA/DESx1 to prox-mid RCA 03/27/12 urgently in setting of hypotension/bradycardia, with staged PTCA/Evolve study stent to mid LAD & PTCA/Evolve study stent to prox LCx 03/29/12 ;   echo 03/28/12:EF 60%, Aortic sclerosis without AS, mild RVE, mild reduced RVSF     Endometrial polyp    HTN (hypertension)    Hyperlipidemia    MI, old    Sleep apnea     Past Surgical History:  Procedure Laterality Date   APPENDECTOMY  80 years old   CAROTID STENT  04/2012   x3   CESAREAN SECTION  1984   CHOLECYSTECTOMY  1978   INTRAMEDULLARY (IM) NAIL INTERTROCHANTERIC Left 06/20/2021   Procedure: INTRAMEDULLARY (IM) NAIL INTERTROCHANTRIC;  Surgeon: Renette Butters, MD;  Location: [facility];  Service: Orthopedics;  Laterality: Left;   LEFT HEART CATHETERIZATION WITH CORONARY ANGIOGRAM N/A 03/27/2012   Procedure: LEFT HEART CATHETERIZATION WITH CORONARY ANGIOGRAM;  Surgeon: Burnell Blanks, MD;  Location: [facility] CATH LAB;   Service: Cardiovascular;  Laterality: N/A;   PERCUTANEOUS CORONARY STENT INTERVENTION (PCI-S) N/A 03/27/2012   Procedure: PERCUTANEOUS CORONARY STENT INTERVENTION (PCI-S);  Surgeon: Burnell Blanks, MD;  Location: [facility] CATH LAB;  Service: Cardiovascular;  Laterality: N/A;   PERCUTANEOUS CORONARY STENT INTERVENTION (PCI-S) N/A 03/29/2012   Procedure: PERCUTANEOUS CORONARY STENT INTERVENTION (PCI-S);  Surgeon: [name], MD;  Location: [facility] CATH LAB;  Service: Cardiovascular;  Laterality: N/A;   TONSILLECTOMY  80 years old    There were no vitals filed for this visit.   Subjective Assessment - 09/11/21 1325     Subjective "I may need to climb 8 step this weekend to get into a family members home"    Currently in Pain? Yes    Pain Score 3                                         PT Education - 09/11/21 1326     Education Details stair climbing technique    Person(s) Educated Patient;Caregiver(s)    Methods Explanation;Demonstration;Tactile cues;Verbal cues    Comprehension Verbalized understanding;Returned demonstration;Verbal cues required              PT Short Term Goals - 09/11/21 1332       PT SHORT  TERM GOAL #1   Status On-going      PT SHORT TERM GOAL #2   Title Progressing with gait training using cane next visit.  Pt amb in pool simulated using cane without LOB.    Status On-going      PT SHORT TERM GOAL #3   Title Pt/cg  instruction today simulated in pool.  They will translate at home this week.    Status On-going               PT Long Term Goals - 08/15/21 2050       PT LONG TERM GOAL #1   Title Pt will be able to play with her grandchildren without limitations    Baseline unable at eval    Time 16    Period Weeks    Status New    Target Date 12/05/21      PT LONG TERM GOAL #2   Title pt will be able to ambulate for at least 1 hour without AD for functional ambulation    Baseline unable and requires RW at eval     Time 16    Period Weeks    Status New    Target Date 12/05/21      PT LONG TERM GOAL #3   Title pt will be able to stand and cook large meal without rest break    Baseline unable at eval    Time 16    Period Weeks    Status New    Target Date 12/05/21      PT LONG TERM GOAL #4   Title pt will d/c to proper long term program including nustep for cardiac challenges    Baseline unable at eval    Time 16    Period Weeks    Status New    Target Date 12/05/21           Pt seen for aquatic therapy today.  Treatment took place in water 3.25-4.8 ft in depth at the [facility] pool. Temp of water was 91.  Pt entered/exited the pool via stairs step to pattern supervisionwith bilat rail.     Warm up forward, backward and side stepping/walking cues for increased step length, increased speed, hand placement to increase resistance. 4 laps each without UE support.   Gait training and stair negotiation  - step ups  leading R/L x 15 reps on bottom step with one ue support on handrail Multiple "laps" in pool pushing/supported by yellow noodle under right UE, progressed to kick board to simulate cane. VC for step length and safety a translating onto land.  Pt will bring cane next visit.  Stair negotiation: pt completes several trials of 6 steps ascending and descending with instruction on technique, leading with proper le, hand placement on handrails, simulating use of just one handrail. Pt completes forward facing as well as side facing. She is instructed on positioning of cg's (below her) and if manual assistance needed, how to hold to cg (rather than cg holding to her).  She is quizzed verbally and with demonstrating to assure understanding for safe completion at home.      Pt requires buoyancy for support and to offload joints with strengthening exercises. Viscosity of the water is needed for resistance of strengthening; water current perturbations provides challenge to standing  balance unsupported, requiring increased core activation.         Plan - 09/11/21 1326     Clinical Impression Statement Focus  on session today is gait training and stair negotiation.  Pt instructed on gait with cane simulated in pool by amb initially holding noodle in right hand then progressed to kick board .  Pt instructed on "cruising/furniture surfing" into bathroom and practicing amb around her [employer] supported by [employer]  She will bring cane into next visit.  Multiplr trials of stair climbing 6 steps in pool.  Pt indep with technique and safety.  Also demonstrates knowledge on how to instructs her cg to best assist her.    PT Treatment/Interventions ADLs/Self Care Home Management;Aquatic Therapy;Cryotherapy;Electrical Stimulation;Gait training;Ultrasound;Traction;Moist Heat;Stair training;Functional mobility training;Therapeutic activities;Therapeutic exercise;Balance training;Neuromuscular re-education;Manual techniques;Patient/family education;Passive range of motion;Dry needling;Taping    PT Next Visit Plan Gait training with cane    PT Home Exercise Plan [id number]             Patient will benefit from skilled therapeutic intervention in order to improve the following deficits and impairments:  Abnormal gait, Decreased range of motion, Difficulty walking, Decreased activity tolerance, Pain, Improper body mechanics, Impaired flexibility, Decreased balance, Decreased scar mobility, Decreased strength, Postural dysfunction  Visit Diagnosis: Pain in left hip  Muscle weakness (generalized)  Difficulty in walking, not elsewhere classified     Problem List Patient Active Problem List   Diagnosis Date Noted   Pain due to onychomycosis of toenails of both feet 08/22/2021   Intertrochanteric fracture of left hip ([place]) 06/24/2021   Closed left hip fracture, initial encounter ([place]) 06/20/2021   Hypokalemia 06/20/2021   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   Cognitive complaints  06/02/2016   Hypoxemic respiratory failure, chronic ([place]) 09/19/2015   MCI (mild cognitive impairment) 03/06/2015   Depression with anxiety 03/06/2015   Bullae 07/02/2014   History of MRSA infection 11/20/2012   Low back pain 07/07/2012   Insomnia 04/28/2012   Coronary Artery Disease 04/19/2012   Hyperlipidemia 04/19/2012   Anxiety 04/19/2012   Non-ST elevation myocardial infarction (NSTEMI), initial care episode ([place]) 03/28/2012   HYPERTENSION, BENIGN 06/12/2010   Dyspnea 06/12/2010   Prolonged QT interval 06/12/2010    Annamarie Major) Jerel Sardina MPT  09/11/2021, 1:43 PM  [facility] [address] [place], [place], [address] Phone: [phone]   Fax:  [phone]  Name: MELENA HAYES MRN: [id number] Date of Birth: Sep 15, 1941

## 2021-09-14 ENCOUNTER — Other Ambulatory Visit: Payer: Self-pay | Admitting: Cardiovascular Disease

## 2021-09-16 ENCOUNTER — Encounter (HOSPITAL_BASED_OUTPATIENT_CLINIC_OR_DEPARTMENT_OTHER): Payer: Self-pay | Admitting: Physical Therapy

## 2021-09-16 ENCOUNTER — Ambulatory Visit (HOSPITAL_BASED_OUTPATIENT_CLINIC_OR_DEPARTMENT_OTHER): Payer: Medicare Other | Admitting: Physical Therapy

## 2021-09-16 ENCOUNTER — Other Ambulatory Visit: Payer: Self-pay

## 2021-09-16 DIAGNOSIS — R262 Difficulty in walking, not elsewhere classified: Secondary | ICD-10-CM

## 2021-09-16 DIAGNOSIS — M6281 Muscle weakness (generalized): Secondary | ICD-10-CM | POA: Diagnosis not present

## 2021-09-16 DIAGNOSIS — M25552 Pain in left hip: Secondary | ICD-10-CM | POA: Diagnosis not present

## 2021-09-16 NOTE — Therapy (Signed)
[place], [place], [address] Phone: [phone]   Fax:  [phone]  Physical Therapy Treatment  Patient Details  Name: Carrie Chung MRN: [id number] Date of Birth: 07-07-1941 Referring Provider (PT): Edmonia Lynch, MD   Encounter Date: 09/16/2021   PT End of Session - 09/16/21 1135     Visit Number 8    Number of Visits 33    Date for PT Re-Evaluation 12/05/21    PT Start Time 1115    PT Stop Time 1201    PT Time Calculation (min) 46 min    Equipment Utilized During Treatment Other (comment)    Activity Tolerance Patient tolerated treatment well    Behavior During Therapy [facility] for tasks assessed/performed             Past Medical History:  Diagnosis Date   Atrial tachycardia ([place])    a. noted at cardiac rehab 5/13; event monitor ordered to assess for AFib   CAD (coronary artery disease)    a. NSTEMI 03/2012 ([place] 03/27/12: pLAD 30%, mLAD 99%, mCFX 95%, mRCA 99% with thrombus, EF 65%);  s/p PTCA/DESx1 to prox-mid RCA 03/27/12 urgently in setting of hypotension/bradycardia, with staged PTCA/Evolve study stent to mid LAD & PTCA/Evolve study stent to prox LCx 03/29/12 ;   echo 03/28/12:EF 60%, Aortic sclerosis without AS, mild RVE, mild reduced RVSF     Endometrial polyp    HTN (hypertension)    Hyperlipidemia    MI, old    Sleep apnea     Past Surgical History:  Procedure Laterality Date   APPENDECTOMY  80 years old   CAROTID STENT  04/2012   x3   CESAREAN SECTION  1984   CHOLECYSTECTOMY  1978   INTRAMEDULLARY (IM) NAIL INTERTROCHANTERIC Left 06/20/2021   Procedure: INTRAMEDULLARY (IM) NAIL INTERTROCHANTRIC;  Surgeon: Renette Butters, MD;  Location: [place];  Service: Orthopedics;  Laterality: Left;   LEFT HEART CATHETERIZATION WITH CORONARY ANGIOGRAM N/A 03/27/2012   Procedure: LEFT HEART CATHETERIZATION WITH CORONARY ANGIOGRAM;  Surgeon: Burnell Blanks, MD;  Location: [facility] CATH LAB;   Service: Cardiovascular;  Laterality: N/A;   PERCUTANEOUS CORONARY STENT INTERVENTION (PCI-S) N/A 03/27/2012   Procedure: PERCUTANEOUS CORONARY STENT INTERVENTION (PCI-S);  Surgeon: Burnell Blanks, MD;  Location: [facility] CATH LAB;  Service: Cardiovascular;  Laterality: N/A;   PERCUTANEOUS CORONARY STENT INTERVENTION (PCI-S) N/A 03/29/2012   Procedure: PERCUTANEOUS CORONARY STENT INTERVENTION (PCI-S);  Surgeon: [name], MD;  Location: [facility] CATH LAB;  Service: Cardiovascular;  Laterality: N/A;   TONSILLECTOMY  80 years old    There were no vitals filed for this visit.   Subjective Assessment - 09/16/21 1123     Subjective I brought my cane today to work with you.    How long can you stand comfortably? 10 mins using cane or cruising in kitchen    Pain Score 4     Pain Location Hip    Pain Orientation Left;Lateral    Pain Descriptors / Indicators Aching    Pain Type Surgical pain    Pain Onset More than a month ago    Pain Frequency Intermittent    Pain Relieving Factors meds, rest, aquatics                                        PT Education - 09/16/21 1135  Person(s) Educated Patient    Methods Explanation    Comprehension Verbalized understanding              PT Short Term Goals - 09/16/21 1254       PT SHORT TERM GOAL #2   Title pt will be safe in household ambulation with SPC      PT SHORT TERM GOAL #3   Title pt will be able to navigate stairs with use of hand rail and SPC to go upstairs in her home               PT Long Term Goals - 09/16/21 1257       PT LONG TERM GOAL #3   Baseline 9/27: pt has made breakfast stanidng    Status On-going               Pt seen for aquatic therapy today.  Treatment took place in water 3.25-4.8 ft in depth at the [facility] pool. Temp of water was 91.  Pt entered/exited the pool via stairs step to pattern supervisionwith bilat rail.     Warm up forward, backward and  side stepping/walking cues for increased step length, increased speed, hand placement to increase resistance. 4 laps each without UE support.   Standing (balance and strengthening) Unsupported: high knee marching 2 x 10 -add/abd 2 x 10, hip ext 2x10 Hip circles with one handed support x 10 All R/L   Gait and Stair negotiation On land: Gait training with cane.  VC for step length and heel strike. Distance ~ 160 ft. Stair climbing x 10 steps using handrail and cane step to pattern up and side stepping bilat hands on hand rail down with cga and VC for safety and execution       Pt requires buoyancy for support and to offload joints with strengthening exercises. Viscosity of the water is needed for resistance of strengthening; water current perturbations provides challenge to standing balance unsupported, requiring increased core activation.         Plan - 09/16/21 1235     Clinical Impression Statement Gait training on land today.  Pt using straight cane adjusted to proper height. VC for step length, pt with shortened step right. Stiar climbing with vc and cga. Pt able to recall technique @ 75% as taught last visit.   Upon completion pt with slightly increased discomfort 5-6/10. Aquatic intervention focused on stretching and core/LE strengthening.  Pt instructed to increase cane use in home crusing as able but returning to fww with increase in discomfort. She will alos use fww with initial OOb in am and in middle of night. Pt with good coordination with its use.    Stability/Clinical Decision Making Stable/Uncomplicated    Clinical Decision Making Low    Rehab Potential Good    PT Frequency 2x / week    PT Treatment/Interventions ADLs/Self Care Home Management;Aquatic Therapy;Cryotherapy;Electrical Stimulation;Gait training;Ultrasound;Traction;Moist Heat;Stair training;Functional mobility training;Therapeutic activities;Therapeutic exercise;Balance training;Neuromuscular re-education;Manual  techniques;Patient/family education;Passive range of motion;Dry needling;Taping    PT Next Visit Plan PAin??    PT Home Exercise Plan [place]             Patient will benefit from skilled therapeutic intervention in order to improve the following deficits and impairments:  Abnormal gait, Decreased range of motion, Difficulty walking, Decreased activity tolerance, Pain, Improper body mechanics, Impaired flexibility, Decreased balance, Decreased scar mobility, Decreased strength, Postural dysfunction  Visit Diagnosis: Pain in left hip  Muscle weakness (generalized)  Difficulty in walking, not elsewhere classified     Problem List Patient Active Problem List   Diagnosis Date Noted   Pain due to onychomycosis of toenails of both feet 08/22/2021   Intertrochanteric fracture of left hip ([place]) 06/24/2021   Closed left hip fracture, initial encounter ([place]) 06/20/2021   Hypokalemia 06/20/2021   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   Cognitive complaints 06/02/2016   Hypoxemic respiratory failure, chronic ([place]) 09/19/2015   MCI (mild cognitive impairment) 03/06/2015   Depression with anxiety 03/06/2015   Bullae 07/02/2014   History of MRSA infection 11/20/2012   Low back pain 07/07/2012   Insomnia 04/28/2012   Coronary Artery Disease 04/19/2012   Hyperlipidemia 04/19/2012   Anxiety 04/19/2012   Non-ST elevation myocardial infarction (NSTEMI), initial care episode ([place]) 03/28/2012   HYPERTENSION, BENIGN 06/12/2010   Dyspnea 06/12/2010   Prolonged QT interval 06/12/2010    Annamarie Major) Arantxa Piercey MPT  09/16/2021, 1:00 PM  [facility] [place], [place], [address] Phone: [phone]   Fax:  [phone]  Name: Carrie Chung MRN: [id number] Date of Birth: Apr 28, 1941

## 2021-09-18 ENCOUNTER — Encounter (HOSPITAL_BASED_OUTPATIENT_CLINIC_OR_DEPARTMENT_OTHER): Payer: Self-pay | Admitting: Physical Therapy

## 2021-09-18 ENCOUNTER — Other Ambulatory Visit: Payer: Self-pay

## 2021-09-18 ENCOUNTER — Ambulatory Visit (HOSPITAL_BASED_OUTPATIENT_CLINIC_OR_DEPARTMENT_OTHER): Payer: Medicare Other | Admitting: Physical Therapy

## 2021-09-18 DIAGNOSIS — M25552 Pain in left hip: Secondary | ICD-10-CM | POA: Diagnosis not present

## 2021-09-18 DIAGNOSIS — R262 Difficulty in walking, not elsewhere classified: Secondary | ICD-10-CM

## 2021-09-18 DIAGNOSIS — M6281 Muscle weakness (generalized): Secondary | ICD-10-CM | POA: Diagnosis not present
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
## 2021-09-22 ENCOUNTER — Telehealth: Payer: Self-pay | Admitting: Pulmonary Disease

## 2021-09-22 DIAGNOSIS — R0602 Shortness of breath: Secondary | ICD-10-CM

## 2021-09-22 DIAGNOSIS — J9611 Chronic respiratory failure with hypoxia: Secondary | ICD-10-CM

## 2021-09-22 NOTE — Telephone Encounter (Signed)
Received fax from [employer] that patient is due for 60 month Oxygen Recertification. Called to make patient an appt as we have not seen her since 11/21/2020. Patient states that she will need to have ONO done before office visit so that we are able to see if she still needs oxygen at her OV.    Dr. Elsworth Soho are you ok with me ordering ONO to be done before appt.

## 2021-09-23 ENCOUNTER — Other Ambulatory Visit: Payer: Self-pay

## 2021-09-23 ENCOUNTER — Ambulatory Visit (HOSPITAL_BASED_OUTPATIENT_CLINIC_OR_DEPARTMENT_OTHER): Payer: Medicare Other | Attending: Physical Medicine and Rehabilitation | Admitting: Physical Therapy

## 2021-09-23 ENCOUNTER — Encounter (HOSPITAL_BASED_OUTPATIENT_CLINIC_OR_DEPARTMENT_OTHER): Payer: Self-pay | Admitting: Physical Therapy

## 2021-09-23 DIAGNOSIS — R2689 Other abnormalities of gait and mobility: Secondary | ICD-10-CM | POA: Insufficient documentation

## 2021-09-23 DIAGNOSIS — G8929 Other chronic pain: Secondary | ICD-10-CM | POA: Diagnosis not present

## 2021-09-23 DIAGNOSIS — M6281 Muscle weakness (generalized): Secondary | ICD-10-CM

## 2021-09-23 DIAGNOSIS — R2681 Unsteadiness on feet: Secondary | ICD-10-CM | POA: Diagnosis not present

## 2021-09-23 DIAGNOSIS — R262 Difficulty in walking, not elsewhere classified: Secondary | ICD-10-CM | POA: Diagnosis not present

## 2021-09-23 DIAGNOSIS — M25561 Pain in right knee: Secondary | ICD-10-CM | POA: Insufficient documentation

## 2021-09-23 DIAGNOSIS — R293 Abnormal posture: Secondary | ICD-10-CM | POA: Insufficient documentation

## 2021-09-23 DIAGNOSIS — M25552 Pain in left hip: Secondary | ICD-10-CM

## 2021-09-24 ENCOUNTER — Telehealth: Payer: Self-pay | Admitting: Cardiovascular Disease

## 2021-09-24 DIAGNOSIS — S72142D Displaced intertrochanteric fracture of left femur, subsequent encounter for closed fracture with routine healing: Secondary | ICD-10-CM | POA: Diagnosis not present

## 2021-09-24 NOTE — Telephone Encounter (Signed)
Called pt and pt stated that she needed Dr. Burt Knack to prescribe her metoprolol 50 mg tablet BID, because her blood pressure has been increasing, since the hospital decreased her medication. Pt states that she has been taking 50 mg tablets that she has at home. Pt would like Dr. Antionette Char nurse to give her a call concerning this matter. Please address

## 2021-09-24 NOTE — Telephone Encounter (Signed)
Returned call to patient who states her metoprolol dose was reduced by the rehabilitation team when she was discharged from the hospital on 7/22 to metoprolol 25 mg BID. Reports increased lethargy, lightheadedness, and elevated BP at home so she increased metoprolol dose back to preadmission dose of metoprolol 75 mg twice daily. She has continued this dose for approximately 6 weeks and is close to running out of medication because her Rx is for 25 mg BID.  BP today 137/75, 138/80; reports HR running 75-80 bpm. In addition to metoprolol 75 mg BID, also taking amlodipine 5 mg daily and HCTZ 25 mg daily. I asked about timing of BP readings and she admits that when she took her BP today she was upset because of talking to pharmacy and unable to get appropriate refill. I advised her to monitor BP and HR on 10/5, 10/6 and 10/7 and that I will call her back on 10/7 to get readings. Advised that Dr. Burt Knack is in the office that day and we will determine appropriate metoprolol dose at that time. She verbalized understanding and agreement and thanked me for the call.

## 2021-09-24 NOTE — Telephone Encounter (Signed)
ONO has been ordered. Nothing further needed at this time.

## 2021-09-24 NOTE — Therapy (Signed)
[facility] [address] [place], [place], [address] Phone: [phone]   Fax:  [phone]  Physical Therapy Treatment Progress Note Reporting Period 08/15/21 to 09/23/21   See note below for Objective Data and Assessment of Progress/Goals.      Patient Details  Name: Carrie Chung MRN: [id number] Date of Birth: September 28, 1941 Referring Provider (PT): Edmonia Lynch, MD   Encounter Date: 09/23/2021   PT End of Session - 09/23/21 1104     Visit Number 10    Number of Visits 33    Date for PT Re-Evaluation 12/05/21    Authorization Type MCR    Progress Note Due on Visit 71    PT Start Time 1103    PT Stop Time 1145    PT Time Calculation (min) 42 min    Activity Tolerance Patient tolerated treatment well    Behavior During Therapy [facility] for tasks assessed/performed             Past Medical History:  Diagnosis Date   Atrial tachycardia ([place])    a. noted at cardiac rehab 5/13; event monitor ordered to assess for AFib   CAD (coronary artery disease)    a. NSTEMI 03/2012 ([place] 03/27/12: pLAD 30%, mLAD 99%, mCFX 95%, mRCA 99% with thrombus, EF 65%);  s/p PTCA/DESx1 to prox-mid RCA 03/27/12 urgently in setting of hypotension/bradycardia, with staged PTCA/Evolve study stent to mid LAD & PTCA/Evolve study stent to prox LCx 03/29/12 ;   echo 03/28/12:EF 60%, Aortic sclerosis without AS, mild RVE, mild reduced RVSF     Endometrial polyp    HTN (hypertension)    Hyperlipidemia    MI, old    Sleep apnea     Past Surgical History:  Procedure Laterality Date   APPENDECTOMY  80 years old   CAROTID STENT  04/2012   x3   CESAREAN SECTION  1984   CHOLECYSTECTOMY  1978   INTRAMEDULLARY (IM) NAIL INTERTROCHANTERIC Left 06/20/2021   Procedure: INTRAMEDULLARY (IM) NAIL INTERTROCHANTRIC;  Surgeon: Renette Butters, MD;  Location: [facility];  Service: Orthopedics;  Laterality: Left;   LEFT HEART CATHETERIZATION WITH CORONARY ANGIOGRAM N/A  03/27/2012   Procedure: LEFT HEART CATHETERIZATION WITH CORONARY ANGIOGRAM;  Surgeon: Burnell Blanks, MD;  Location: [facility] CATH LAB;  Service: Cardiovascular;  Laterality: N/A;   PERCUTANEOUS CORONARY STENT INTERVENTION (PCI-S) N/A 03/27/2012   Procedure: PERCUTANEOUS CORONARY STENT INTERVENTION (PCI-S);  Surgeon: Burnell Blanks, MD;  Location: [facility] CATH LAB;  Service: Cardiovascular;  Laterality: N/A;   PERCUTANEOUS CORONARY STENT INTERVENTION (PCI-S) N/A 03/29/2012   Procedure: PERCUTANEOUS CORONARY STENT INTERVENTION (PCI-S);  Surgeon: [name], MD;  Location: [facility] CATH LAB;  Service: Cardiovascular;  Laterality: N/A;   TONSILLECTOMY  80 years old    There were no vitals filed for this visit.   Subjective Assessment - 09/23/21 1105     Subjective Carrie Chung is fabulous. I had some incr pain last week so we decr some intensity and it feels better. Have been walking with SPC around [place] in the kitchen. Cannot carry things. I used the cane at the park last weekend at my grand daughters bday party.    Patient is accompained by: Family member    How long can you stand comfortably? about 5 min- limited by pain and fatigued around lower back    How long can you walk comfortably? about 10 min cruising    Patient Stated Goals play with grand children, use the nu step the  way I used to (as a cardio challenge), stand in kitchen comfortably to cook, navigate stairs- HR on one side, sleep on Lt side.    Currently in Pain? Yes    Pain Score 4     Pain Location Hip    Pain Orientation Left;Lateral    Pain Descriptors / Indicators Sharp    Aggravating Factors  weight bearing, leaning on Lt side    Pain Relieving Factors rest, aquatics                [facility] PT Assessment - 09/24/21 0001       Assessment   Medical Diagnosis s/p IM Nail Lt femur, closed reduction and manipultion of Lt hip fx    Referring Provider (PT) Edmonia Lynch, MD    Onset Date/Surgical Date 06/20/21    Prior  Therapy inpatient and home health      Precautions   Precautions [place]   Additional Comments stairs with hand rail- on Rt ascending      Prior Function   Vocation Requirements play with grand children, cooking      AROM   Overall AROM Comments standing hip ext -3      PROM   Left Hip Extension able to lay supine with hip to neutral  with significant soft tissue limitations anteriorly    Left Hip Flexion 95 pain with soft end feel    Left Hip External Rotation  20    Left Hip Internal Rotation  10      Strength   Overall Strength Comments able to stand from elevated table without UE assist    Left Hip ABduction 3/5      Ambulation/Gait   Gait Comments appropriate LE pattern using RW                           [facility] Adult PT Treatment/Exercise - 09/24/21 0001       Manual Therapy   Manual Therapy Soft tissue mobilization;Passive ROM    Soft tissue mobilization roller Lt quads    Passive ROM hip flexion, ER, IR, gentle distraction to encourage extension in supine                     PT Education - 09/23/21 1613     Education Details goals, progress, POC, AD use in difference scenarios.    Person(s) Educated Patient    Methods Explanation    Comprehension Verbalized understanding;Need further instruction              PT Short Term Goals - 09/23/21 1114       PT SHORT TERM GOAL #1   Title pt will demo hip flexion, abd and extension strength at least 4/5    Baseline see flowsheet    Status On-going      PT SHORT TERM GOAL #2   Title pt will be safe in household ambulation with SPC    Baseline it depends where I am, tend to use walker just to help carry things- probably use it more than I should, and use the walker because of single step down level in the house    Status Partially Met      PT SHORT TERM GOAL #3   Title pt will be able to navigate stairs with use of hand rail and SPC to go upstairs in her home     Baseline I  did the stairs going up and was terrified to go down but I did it, I still try to avoid it though, frankie taught me how to properly have somebody assist me    Status Partially Met               PT Long Term Goals - 09/16/21 1257       PT LONG TERM GOAL #3   Baseline 9/27: pt has made breakfast stanidng    Status On-going                   Plan - 09/24/21 0842     Clinical Impression Statement Pt is making significant improvements in ambulation ability- is not placing much downward pressure onto walker and is presenting a good toe off, swing through, heel strike and step length. Having significant discomfort on anterior hip and thigh when extending- noted tension in hip flexors and quads that has decreased, allowing her stand up straighter than before, but still not to a neutral position placing increased stress. She admits that she is likely using her walker more than she really should  but it is for fear of falling. We will continue aquatic therapy until her next progress noted to continue gait training, stretching and strengthening with decreased pressure through joint and then transition to more land-based appointments following next progress note. Pt agreed with plan and has a f/u with MD on 10/5.    PT Treatment/Interventions ADLs/Self Care Home Management;Aquatic Therapy;Cryotherapy;Electrical Stimulation;Gait training;Ultrasound;Traction;Moist Heat;Stair training;Functional mobility training;Therapeutic activities;Therapeutic exercise;Balance training;Neuromuscular re-education;Manual techniques;Patient/family education;Passive range of motion;Dry needling;Taping    PT Next Visit Plan aquatics- encouraging hip ext via flexibility & strength, gait without UE support for confidence    PT Home Exercise Plan [id number]    Consulted and Agree with Plan of Care Patient             Patient will benefit from skilled therapeutic intervention in order to improve  the following deficits and impairments:  Abnormal gait, Decreased range of motion, Difficulty walking, Decreased activity tolerance, Pain, Improper body mechanics, Impaired flexibility, Decreased balance, Decreased scar mobility, Decreased strength, Postural dysfunction  Visit Diagnosis: Pain in left hip  Muscle weakness (generalized)  Difficulty in walking, not elsewhere classified     Problem List Patient Active Problem List   Diagnosis Date Noted   Pain due to onychomycosis of toenails of both feet 08/22/2021   Intertrochanteric fracture of left hip ([place]) 06/24/2021   Closed left hip fracture, initial encounter ([place]) 06/20/2021   Hypokalemia 06/20/2021   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   Cognitive complaints 06/02/2016   Hypoxemic respiratory failure, chronic ([place]) 09/19/2015   MCI (mild cognitive impairment) 03/06/2015   Depression with anxiety 03/06/2015   Bullae 07/02/2014   History of MRSA infection 11/20/2012   Low back pain 07/07/2012   Insomnia 04/28/2012   Coronary Artery Disease 04/19/2012   Hyperlipidemia 04/19/2012   Anxiety 04/19/2012   Non-ST elevation myocardial infarction (NSTEMI), initial care episode ([place]) 03/28/2012   HYPERTENSION, BENIGN 06/12/2010   Dyspnea 06/12/2010   Prolonged QT interval 06/12/2010   Tiny Rietz C. Lewie Deman PT, DPT 09/24/21 8:52 AM   [facility] [place], [place], [address] Phone: [phone]   Fax:  [phone]  Name: Carrie Chung MRN: [id number] Date of Birth: Sep 13, 1941

## 2021-09-24 NOTE — Telephone Encounter (Signed)
   Pt c/o medication issue:  1. Name of Medication: metoprolol tartrate (LOPRESSOR) 25 MG tablet  2. How are you currently taking this medication (dosage and times per day)? Take 1 tablet (25 mg total) by mouth 2 (two) times daily.  3. Are you having a reaction (difficulty breathing--STAT)?   4. What is your medication issue? Pt needs refill but she needs 50mg  of her metoprolol

## 2021-09-26 ENCOUNTER — Ambulatory Visit (HOSPITAL_BASED_OUTPATIENT_CLINIC_OR_DEPARTMENT_OTHER): Payer: Medicare Other | Admitting: Physical Therapy

## 2021-09-26 ENCOUNTER — Other Ambulatory Visit: Payer: Self-pay

## 2021-09-26 ENCOUNTER — Encounter (HOSPITAL_BASED_OUTPATIENT_CLINIC_OR_DEPARTMENT_OTHER): Payer: Self-pay | Admitting: Physical Therapy

## 2021-09-26 DIAGNOSIS — M25552 Pain in left hip: Secondary | ICD-10-CM

## 2021-09-26 DIAGNOSIS — R262 Difficulty in walking, not elsewhere classified: Secondary | ICD-10-CM

## 2021-09-26 DIAGNOSIS — R293 Abnormal posture: Secondary | ICD-10-CM | POA: Diagnosis not present

## 2021-09-26 DIAGNOSIS — G8929 Other chronic pain: Secondary | ICD-10-CM | POA: Diagnosis not present

## 2021-09-26 DIAGNOSIS — M6281 Muscle weakness (generalized): Secondary | ICD-10-CM

## 2021-09-26 DIAGNOSIS — M25561 Pain in right knee: Secondary | ICD-10-CM | POA: Diagnosis not present

## 2021-09-26 MED ORDER — METOPROLOL TARTRATE 50 MG PO TABS: 75.0000 mg | ORAL_TABLET | Freq: Two times a day (BID) | ORAL | 3 refills | Status: DC

## 2021-09-26 NOTE — Telephone Encounter (Signed)
Called patient to get BP and HR readings over the past few days. She reports the following:  10/5 in the evening: 135/74, 76 10/6 am 138/70, 75 10/6 pm 141/69, 74 I advised that I will review with Dr. Burt Knack who is in the office but I believe he will be agreeable to resume metoprolol tartrate 75 mg bid. I advised I will call back if he advises any changes, otherwise her metoprolol 50 mg take 1.5 tablets twice daily Rx will be sent to her pharmacy. She verbalized understanding and agreement and thanked me for the call.  Confirmed dosage with Dr. Burt Knack and Rx sent to patient's pharmacy.

## 2021-09-26 NOTE — Therapy (Signed)
[facility] [place], [place], [address] Phone: [phone]   Fax:  [phone]  Physical Therapy Treatment  Patient Details  Name: Carrie Chung MRN: [id number] Date of Birth: 07/03/1941 Referring Provider (PT): Carrie Lynch, MD   Encounter Date: 09/26/2021   PT End of Session - 09/26/21 1202     Visit Number 11    Number of Visits 33    Date for PT Re-Evaluation 12/05/21    Authorization Type MCR    Progress Note Due on Visit 55    PT Start Time 1203    PT Stop Time 1250    PT Time Calculation (min) 47 min    Equipment Utilized During Treatment Other (comment)    Activity Tolerance Patient tolerated treatment well    Behavior During Therapy [facility] for tasks assessed/performed             Past Medical History:  Diagnosis Date   Atrial tachycardia ([place])    a. noted at cardiac rehab 5/13; event monitor ordered to assess for AFib   CAD (coronary artery disease)    a. NSTEMI 03/2012 ([place] 03/27/12: pLAD 30%, mLAD 99%, mCFX 95%, mRCA 99% with thrombus, EF 65%);  s/p PTCA/DESx1 to prox-mid RCA 03/27/12 urgently in setting of hypotension/bradycardia, with staged PTCA/Evolve study stent to mid LAD & PTCA/Evolve study stent to prox LCx 03/29/12 ;   echo 03/28/12:EF 60%, Aortic sclerosis without AS, mild RVE, mild reduced RVSF     Endometrial polyp    HTN (hypertension)    Hyperlipidemia    MI, old    Sleep apnea     Past Surgical History:  Procedure Laterality Date   APPENDECTOMY  80 years old   CAROTID STENT  04/2012   x3   CESAREAN SECTION  1984   CHOLECYSTECTOMY  1978   INTRAMEDULLARY (IM) NAIL INTERTROCHANTERIC Left 06/20/2021   Procedure: INTRAMEDULLARY (IM) NAIL INTERTROCHANTRIC;  Surgeon: Carrie Butters, MD;  Location: [facility];  Service: Orthopedics;  Laterality: Left;   LEFT HEART CATHETERIZATION WITH CORONARY ANGIOGRAM N/A 03/27/2012   Procedure: LEFT HEART CATHETERIZATION WITH CORONARY ANGIOGRAM;   Surgeon: Carrie Blanks, MD;  Location: [facility] CATH LAB;  Service: Cardiovascular;  Laterality: N/A;   PERCUTANEOUS CORONARY STENT INTERVENTION (PCI-S) N/A 03/27/2012   Procedure: PERCUTANEOUS CORONARY STENT INTERVENTION (PCI-S);  Surgeon: Carrie Blanks, MD;  Location: [facility] CATH LAB;  Service: Cardiovascular;  Laterality: N/A;   PERCUTANEOUS CORONARY STENT INTERVENTION (PCI-S) N/A 03/29/2012   Procedure: PERCUTANEOUS CORONARY STENT INTERVENTION (PCI-S);  Surgeon: Carrie Mocha, MD;  Location: [facility] CATH LAB;  Service: Cardiovascular;  Laterality: N/A;   TONSILLECTOMY  80 years old    There were no vitals filed for this visit.   Subjective Assessment - 09/26/21 1308     Subjective Saw Dr Carrie Chung, he has dc'ed me.  Continue to have pain in my hip on the inside.  Dr Carrie Chung said it may hand around for 6 months to a year"                                          PT Short Term Goals - 09/23/21 1114       PT SHORT TERM GOAL #1   Title pt will demo hip flexion, abd and extension strength at least 4/5    Baseline see flowsheet    Status On-going  PT SHORT TERM GOAL #2   Title pt will be safe in household ambulation with SPC    Baseline it depends where I am, tend to use walker just to help carry things- probably use it more than I should, and use the walker because of single step down level in the house    Status Partially Met      PT SHORT TERM GOAL #3   Title pt will be able to navigate stairs with use of hand rail and SPC to go upstairs in her home    Baseline I did the stairs going up and was terrified to go down but I did it, I still try to avoid it though, Carrie Chung taught me how to properly have somebody assist me    Status Partially Met               PT Long Term Goals - 09/16/21 1257       PT LONG TERM GOAL #3   Baseline 9/27: pt has made breakfast stanidng    Status On-going            Pt seen for aquatic therapy today.   Treatment took place in water 3.25-4.8 ft in depth at the [employer] pool. Temp of water was 91.  Pt entered/exited the pool via stairs step to pattern supervisionwith bilat rail.     Warm up forward, backward and side stepping/walking cues for increased step length, increased speed, hand placement to increase resistance. 6 laps each without UE support.   Seated -Stretching gastroc, adductors and hamstring 5 x 30 sec hold with manual assist for proper execution and adequate stretch    Standing Step ups on bottom step forward x 10 R/L holding to handrail bilat; side stepping to left x 10 holding to handrail then with bilat ue support of therapist giving vc and demonstration for weight shift/proper execution; backward  R/L bilat ue support on handrail x 10. -noodle kick downs R/L hip in neutral then ext rotation then neutral 3 x 10. Cues for tightened core. Using hand buoys for ue support -Add/abd x 10 -hip flex x 10  Core strengthening Kick board push downs 2 x 10 reps cues for tightened core Walking 4 width holding hand buoys at side.  Water walking x 4 widths lunging    Pt requires buoyancy for support and to offload joints with strengthening exercises. Viscosity of the water is needed for resistance of strengthening; water current perturbations provides challenge to standing balance unsupported, requiring increased core activation.4    Patient will benefit from skilled therapeutic intervention in order to improve the following deficits and impairments:     Visit Diagnosis: Pain in left hip  Muscle weakness (generalized)  Difficulty in walking, not elsewhere classified     Problem List Patient Active Problem List   Diagnosis Date Noted   Pain due to onychomycosis of toenails of both feet 08/22/2021   Intertrochanteric fracture of left hip ([place]) 06/24/2021   Closed left hip fracture, initial encounter ([place]) 06/20/2021   Hypokalemia 06/20/2021   Olfactory aura  12/29/2017   Cognitive complaints 06/02/2016   Hypoxemic respiratory failure, chronic ([place]) 09/19/2015   MCI (mild cognitive impairment) 03/06/2015   Depression with anxiety 03/06/2015   Bullae 07/02/2014   History of MRSA infection 11/20/2012   Low back pain 07/07/2012   Insomnia 04/28/2012   Coronary Artery Disease 04/19/2012   Hyperlipidemia 04/19/2012   Anxiety 04/19/2012   Non-ST elevation myocardial infarction (NSTEMI),  initial care episode ([place]) 03/28/2012   HYPERTENSION, BENIGN 06/12/2010   Dyspnea 06/12/2010   Prolonged QT interval 06/12/2010    Annamarie Major) Evita Merida MPT  09/26/2021, 1:41 PM  [facility] [address] [place], [place], [address] Phone: [phone]   Fax:  [phone]  Name: Carrie Chung MRN: [id number] Date of Birth: 1941/06/18

## 2021-09-30 ENCOUNTER — Ambulatory Visit (HOSPITAL_BASED_OUTPATIENT_CLINIC_OR_DEPARTMENT_OTHER): Payer: Medicare Other | Admitting: Physical Therapy

## 2021-09-30 ENCOUNTER — Other Ambulatory Visit: Payer: Self-pay

## 2021-09-30 ENCOUNTER — Encounter (HOSPITAL_BASED_OUTPATIENT_CLINIC_OR_DEPARTMENT_OTHER): Payer: Self-pay | Admitting: Physical Therapy

## 2021-09-30 DIAGNOSIS — R293 Abnormal posture: Secondary | ICD-10-CM | POA: Diagnosis not present

## 2021-09-30 DIAGNOSIS — M25552 Pain in left hip: Secondary | ICD-10-CM

## 2021-09-30 DIAGNOSIS — M25561 Pain in right knee: Secondary | ICD-10-CM | POA: Diagnosis not present

## 2021-09-30 DIAGNOSIS — M6281 Muscle weakness (generalized): Secondary | ICD-10-CM | POA: Diagnosis not present

## 2021-09-30 DIAGNOSIS — R262 Difficulty in walking, not elsewhere classified: Secondary | ICD-10-CM

## 2021-09-30 DIAGNOSIS — G8929 Other chronic pain: Secondary | ICD-10-CM | POA: Diagnosis not present

## 2021-09-30 NOTE — Therapy (Signed)
[place], [place], [address] Phone: [phone]   Fax:  [phone]  Physical Therapy Treatment  Patient Details  Name: Carrie Chung MRN: [id number] Date of Birth: [birth year] Referring Provider (PT): Edmonia Lynch, MD   Encounter Date: 09/30/2021   PT End of Session - 09/30/21 1131     Visit Number 12    Number of Visits 73    Date for PT Re-Evaluation 12/05/21    Authorization Type MCR    Progress Note Due on Visit 15    PT Start Time 1117    PT Stop Time 1200    PT Time Calculation (min) 43 min    Equipment Utilized During Treatment Other (comment)    Activity Tolerance Patient tolerated treatment well    Behavior During Therapy [facility] for tasks assessed/performed             Past Medical History:  Diagnosis Date   Atrial tachycardia ([place])    a. noted at cardiac rehab 5/13; event monitor ordered to assess for AFib   CAD (coronary artery disease)    a. NSTEMI 03/2012 ([place] 03/27/12: pLAD 30%, mLAD 99%, mCFX 95%, mRCA 99% with thrombus, EF 65%);  s/p PTCA/DESx1 to prox-mid RCA 03/27/12 urgently in setting of hypotension/bradycardia, with staged PTCA/Evolve study stent to mid LAD & PTCA/Evolve study stent to prox LCx 03/29/12 ;   echo 03/28/12:EF 60%, Aortic sclerosis without AS, mild RVE, mild reduced RVSF     Endometrial polyp    HTN (hypertension)    Hyperlipidemia    MI, old    Sleep apnea     Past Surgical History:  Procedure Laterality Date   APPENDECTOMY  80 years old   CAROTID STENT  04/2012   x3   CESAREAN SECTION  80   CHOLECYSTECTOMY  1978   INTRAMEDULLARY (IM) NAIL INTERTROCHANTERIC Left 80   Procedure: INTRAMEDULLARY (IM) NAIL INTERTROCHANTRIC;  Surgeon: Renette Butters, MD;  Location: [place];  Service: Orthopedics;  Laterality: Left;   LEFT HEART CATHETERIZATION WITH CORONARY ANGIOGRAM N/A 03/27/2012   Procedure: LEFT HEART CATHETERIZATION WITH CORONARY ANGIOGRAM;   Surgeon: Burnell Blanks, MD;  Location: [facility] CATH LAB;  Service: Cardiovascular;  Laterality: N/A;   PERCUTANEOUS CORONARY STENT INTERVENTION (PCI-S) N/A 03/27/2012   Procedure: PERCUTANEOUS CORONARY STENT INTERVENTION (PCI-S);  Surgeon: Burnell Blanks, MD;  Location: [facility] CATH LAB;  Service: Cardiovascular;  Laterality: N/A;   PERCUTANEOUS CORONARY STENT INTERVENTION (PCI-S) N/A 03/29/2012   Procedure: PERCUTANEOUS CORONARY STENT INTERVENTION (PCI-S);  Surgeon: [name], MD;  Location: [facility] CATH LAB;  Service: Cardiovascular;  Laterality: N/A;   TONSILLECTOMY  80 years old    There were no vitals filed for this visit.   Subjective Assessment - 09/30/21 1129     Subjective "[place] [employer] [place], was so happy but today I am soar"    Currently in Pain? Yes                                          PT Short Term Goals - 09/23/21 1114       PT SHORT TERM GOAL #1   Title pt will demo hip flexion, abd and extension strength at least 4/5    Baseline see flowsheet    Status On-going      PT SHORT TERM GOAL #2   Title pt  will be safe in household ambulation with SPC    Baseline it depends where I am, tend to use walker just to help carry things- probably use it more than I should, and use the walker because of single step down level in the house    Status Partially Met      PT SHORT TERM GOAL #3   Title pt will be able to navigate stairs with use of hand rail and SPC to go upstairs in her home    Baseline I did the stairs going up and was terrified to go down but I did it, I still try to avoid it though, frankie taught me how to properly have somebody assist me    Status Partially Met               PT Long Term Goals - 09/16/21 1257       PT LONG TERM GOAL #3   Baseline 9/27: pt has made breakfast stanidng    Status On-going           Pt seen for aquatic therapy today.  Treatment took place in water 3.25-4.8 ft in depth  at the [employer] pool. Temp of water was 91.  Pt entered/exited the pool via stairs step to pattern supervisionwith bilat rail.     Warm up forward, backward and side stepping/walking cues for increased step length, increased speed, hand placement to increase resistance. 6 laps each without UE support.   Seated -Stretching gastroc, adductors and hamstring 5 x 30 sec hold with manual assist for proper execution and adequate stretch     Standing Step ups on bottom step forward x 10 R/L holding to handrail bilat; side stepping to left x 10 holding to handrail then with bilat ue support of therapist giving vc and demonstration for weight shift/proper execution; backward  R/L bilat ue support on handrail x 10. -noodle kick downs R/L hip in neutral then ext rotation then neutral 3 x 10. Cues for tightened core. Using 1 foam hand buoy for ue support -Add/abd x 10 -hip flex x 10 -hip ext x 10 Modified Ai Chi #13 holding to hand buoys x 5 reps R/L   Core strengthening Kick board push downs 2 x 10 reps cues for tightened core added manual perturbations Walking 4 width holding 1 foam hand buoys at side.   Water walking x 4 widths lunging     Pt requires buoyancy for support and to offload joints with strengthening exercises. Viscosity of the water is needed for resistance of strengthening; water current perturbations provides challenge to standing balance unsupported, requiring increased core activation.        Plan - 09/30/21 1135     Clinical Impression Statement Pt with improved endurances as evidenced by walking through [employer] (using cane as well as pushing cart).  She reports increased use of cane in home environemnt. Improved balance as demonstrated by decreasing ue support to 1 foam buoy without LOB.  Attempted side stepping without toleration today due to some increased in discomfort from trip to [employer] . Pt with normalized gait submerged, cointinues with antalgic gait on  land.    Stability/Clinical Decision Making Stable/Uncomplicated    Clinical Decision Making Low    Rehab Potential Good    PT Frequency 2x / week    PT Duration Other (comment)    PT Treatment/Interventions ADLs/Self Care Home Management;Aquatic Therapy;Cryotherapy;Electrical Stimulation;Gait training;Ultrasound;Traction;Moist Heat;Stair training;Functional mobility training;Therapeutic activities;Therapeutic exercise;Balance training;Neuromuscular re-education;Manual techniques;Patient/family education;Passive range  of motion;Dry needling;Taping    PT Next Visit Plan hip ext stretching    PT Home Exercise Plan [id number]             Patient will benefit from skilled therapeutic intervention in order to improve the following deficits and impairments:  Abnormal gait, Decreased range of motion, Difficulty walking, Decreased activity tolerance, Pain, Improper body mechanics, Impaired flexibility, Decreased balance, Decreased scar mobility, Decreased strength, Postural dysfunction  Visit Diagnosis: Pain in left hip  Muscle weakness (generalized)  Difficulty in walking, not elsewhere classified     Problem List Patient Active Problem List   Diagnosis Date Noted   Pain due to onychomycosis of toenails of both feet 08/22/2021   Intertrochanteric fracture of left hip ([place]) 06/24/2021   Closed left hip fracture, initial encounter ([place]) 06/20/2021   Hypokalemia 06/20/2021   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   Cognitive complaints 06/02/2016   Hypoxemic respiratory failure, chronic ([place]) 09/19/2015   MCI (mild cognitive impairment) 03/06/2015   Depression with anxiety 03/06/2015   Bullae 07/02/2014   History of MRSA infection 11/20/2012   Low back pain 07/07/2012   Insomnia 04/28/2012   Coronary Artery Disease 04/19/2012   Hyperlipidemia 04/19/2012   Anxiety 04/19/2012   Non-ST elevation myocardial infarction (NSTEMI), initial care episode ([place]) 03/28/2012   HYPERTENSION, BENIGN  06/12/2010   Dyspnea 06/12/2010   Prolonged QT interval 06/12/2010    Annamarie Major) Ritika Hellickson MPT  09/30/2021, 12:19 PM  [facility] [address] [place], [place], [address] Phone: [phone]   Fax:  [phone]  Name: BICH MCHANEY MRN: [id number] Date of Birth: 10/07/1941

## 2021-10-03 ENCOUNTER — Ambulatory Visit (HOSPITAL_BASED_OUTPATIENT_CLINIC_OR_DEPARTMENT_OTHER): Payer: Medicare Other | Admitting: Physical Therapy

## 2021-10-03 ENCOUNTER — Encounter (HOSPITAL_BASED_OUTPATIENT_CLINIC_OR_DEPARTMENT_OTHER): Payer: Self-pay | Admitting: Physical Therapy

## 2021-10-03 ENCOUNTER — Other Ambulatory Visit: Payer: Self-pay

## 2021-10-03 DIAGNOSIS — M6281 Muscle weakness (generalized): Secondary | ICD-10-CM

## 2021-10-03 DIAGNOSIS — R293 Abnormal posture: Secondary | ICD-10-CM | POA: Diagnosis not present

## 2021-10-03 DIAGNOSIS — R2689 Other abnormalities of gait and mobility: Secondary | ICD-10-CM

## 2021-10-03 DIAGNOSIS — M25552 Pain in left hip: Secondary | ICD-10-CM | POA: Diagnosis not present

## 2021-10-03 DIAGNOSIS — R2681 Unsteadiness on feet: Secondary | ICD-10-CM

## 2021-10-03 DIAGNOSIS — R262 Difficulty in walking, not elsewhere classified: Secondary | ICD-10-CM | POA: Diagnosis not present

## 2021-10-03 DIAGNOSIS — G8929 Other chronic pain: Secondary | ICD-10-CM

## 2021-10-03 DIAGNOSIS — Z23 Encounter for immunization: Secondary | ICD-10-CM | POA: Diagnosis not present

## 2021-10-03 DIAGNOSIS — M25561 Pain in right knee: Secondary | ICD-10-CM | POA: Diagnosis not present

## 2021-10-03 NOTE — Therapy (Signed)
[place], [place], [address] Phone: [phone]   Fax:  [phone]  Physical Therapy Treatment  Patient Details  Name: Carrie Chung MRN: [id number] Date of Birth: 04-02-1941 Referring Provider (PT): Edmonia Lynch, MD   Encounter Date: 10/03/2021   PT End of Session - 10/03/21 1234     Visit Number 13    Number of Visits 76    Date for PT Re-Evaluation 12/05/21    Authorization Type MCR    Progress Note Due on Visit 60    PT Start Time 1116    PT Stop Time 1200    PT Time Calculation (min) 44 min    Equipment Utilized During Treatment Other (comment)    Activity Tolerance Patient tolerated treatment well    Behavior During Therapy [facility] for tasks assessed/performed             Past Medical History:  Diagnosis Date   Atrial tachycardia ([place])    a. noted at cardiac rehab 5/13; event monitor ordered to assess for AFib   CAD (coronary artery disease)    a. NSTEMI 03/2012 ([place] 03/27/12: pLAD 30%, mLAD 99%, mCFX 95%, mRCA 99% with thrombus, EF 65%);  s/p PTCA/DESx1 to prox-mid RCA 03/27/12 urgently in setting of hypotension/bradycardia, with staged PTCA/Evolve study stent to mid LAD & PTCA/Evolve study stent to prox LCx 03/29/12 ;   echo 03/28/12:EF 60%, Aortic sclerosis without AS, mild RVE, mild reduced RVSF     Endometrial polyp    HTN (hypertension)    Hyperlipidemia    MI, old    Sleep apnea     Past Surgical History:  Procedure Laterality Date   APPENDECTOMY  80 years old   CAROTID STENT  04/2012   x3   CESAREAN SECTION  1984   CHOLECYSTECTOMY  1978   INTRAMEDULLARY (IM) NAIL INTERTROCHANTERIC Left 06/20/2021   Procedure: INTRAMEDULLARY (IM) NAIL INTERTROCHANTRIC;  Surgeon: Renette Butters, MD;  Location: [place];  Service: Orthopedics;  Laterality: Left;   LEFT HEART CATHETERIZATION WITH CORONARY ANGIOGRAM N/A 03/27/2012   Procedure: LEFT HEART CATHETERIZATION WITH CORONARY ANGIOGRAM;   Surgeon: Burnell Blanks, MD;  Location: [facility] CATH LAB;  Service: Cardiovascular;  Laterality: N/A;   PERCUTANEOUS CORONARY STENT INTERVENTION (PCI-S) N/A 03/27/2012   Procedure: PERCUTANEOUS CORONARY STENT INTERVENTION (PCI-S);  Surgeon: Burnell Blanks, MD;  Location: [facility] CATH LAB;  Service: Cardiovascular;  Laterality: N/A;   PERCUTANEOUS CORONARY STENT INTERVENTION (PCI-S) N/A 03/29/2012   Procedure: PERCUTANEOUS CORONARY STENT INTERVENTION (PCI-S);  Surgeon: [name], MD;  Location: [facility] CATH LAB;  Service: Cardiovascular;  Laterality: N/A;   TONSILLECTOMY  80 years old    There were no vitals filed for this visit.   Subjective Assessment - 10/03/21 1248     Subjective "The front and inside of my left hip is still really hurting, hasn't let up"                                        PT Education - 10/03/21 1248     Education Details indep deep pressure on hip flex              PT Short Term Goals - 09/23/21 1114       PT SHORT TERM GOAL #1   Title pt will demo hip flexion, abd and extension strength at least 4/5  Baseline see flowsheet    Status On-going      PT SHORT TERM GOAL #2   Title pt will be safe in household ambulation with SPC    Baseline it depends where I am, tend to use walker just to help carry things- probably use it more than I should, and use the walker because of single step down level in the house    Status Partially Met      PT SHORT TERM GOAL #3   Title pt will be able to navigate stairs with use of hand rail and SPC to go upstairs in her home    Baseline I did the stairs going up and was terrified to go down but I did it, I still try to avoid it though, frankie taught me how to properly have somebody assist me    Status Partially Met               PT Long Term Goals - 09/16/21 1257       PT LONG TERM GOAL #3   Baseline 9/27: pt has made breakfast stanidng    Status On-going                 Pt seen for aquatic therapy today.  Treatment took place in water 3.25-4.8 ft in depth at the [employer] [facility] pool. Temp of water was 91.  Pt entered/exited the pool via stairs step to pattern supervisionwith bilat rail.     Warm up forward, backward and side stepping/walking cues for increased step length, increased speed, hand placement to increase resistance. 6 laps each without UE support.  Suspended in supine gentle [place] technique used to encourage relaxation.  -massage and trigger point release left hip flex -hip extension x 10 Vc for hip extension stretch and glute activation  Standing -backward lunge x 10 -hip extension R/L x 10  Forward, backward and side stepping 2 widths each after stretching and manual technique left hip flex      Pt requires buoyancy for support and to offload joints with strengthening exercises. Viscosity of the water is needed for resistance of strengthening; water current perturbations provides challenge to standing balance unsupported, requiring increased core activation   Patient will benefit from skilled therapeutic intervention in order to improve the following deficits and impairments:  Abnormal gait, Decreased range of motion, Difficulty walking, Decreased activity tolerance, Pain, Improper body mechanics, Impaired flexibility, Decreased balance, Decreased scar mobility, Decreased strength, Postural dysfunction  Visit Diagnosis: Abnormal posture  Chronic pain of right knee  Difficulty in walking, not elsewhere classified  Muscle weakness (generalized)  Unsteadiness on feet  Other abnormalities of gait and mobility     Problem List Patient Active Problem List   Diagnosis Date Noted   Pain due to onychomycosis of toenails of both feet 08/22/2021   Intertrochanteric fracture of left hip ([facility]) 06/24/2021   Closed left hip fracture, initial encounter ([place]) 06/20/2021   Hypokalemia 06/20/2021   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    Cognitive complaints 06/02/2016   Hypoxemic respiratory failure, chronic ([facility]) 09/19/2015   MCI (mild cognitive impairment) 03/06/2015   Depression with anxiety 03/06/2015   Bullae 07/02/2014   History of MRSA infection 11/20/2012   Low back pain 07/07/2012   Insomnia 04/28/2012   Coronary Artery Disease 04/19/2012   Hyperlipidemia 04/19/2012   Anxiety 04/19/2012   Non-ST elevation myocardial infarction (NSTEMI), initial care episode ([place]) 03/28/2012   HYPERTENSION, BENIGN 06/12/2010   Dyspnea 06/12/2010   Prolonged  QT interval 06/12/2010   Clinical Assessment Muscle tightness in left pectineus and adductor longus. Manual techniques decreased tightness and pain. followed up with stretching of hip flex with strengthening of hip extensors. Pt reports decreased soarness with amb in and out of the water. She is instructed on self deep pressure techniqe to complete at home. Stanton Kidney Tharon Aquas) Elmo Shumard MPT  10/03/2021, 1:01 PM  [facility] [address] [place], [place], [address] Phone: [phone]   Fax:  [phone]  Name: Carrie Chung MRN: [id number] Date of Birth: 09-30-1941

## 2021-10-06 ENCOUNTER — Ambulatory Visit (INDEPENDENT_AMBULATORY_CARE_PROVIDER_SITE_OTHER): Payer: Medicare Other | Admitting: Internal Medicine

## 2021-10-06 ENCOUNTER — Encounter: Payer: Self-pay | Admitting: Pulmonary Disease

## 2021-10-06 ENCOUNTER — Other Ambulatory Visit: Payer: Self-pay

## 2021-10-06 VITALS — BP 108/62 | HR 70 | Temp 98.6°F | Ht 60.0 in | Wt 139.0 lb

## 2021-10-06 DIAGNOSIS — K219 Gastro-esophageal reflux disease without esophagitis: Secondary | ICD-10-CM | POA: Diagnosis not present

## 2021-10-06 DIAGNOSIS — I252 Old myocardial infarction: Secondary | ICD-10-CM

## 2021-10-06 DIAGNOSIS — F32A Depression, unspecified: Secondary | ICD-10-CM

## 2021-10-06 DIAGNOSIS — I10 Essential (primary) hypertension: Secondary | ICD-10-CM

## 2021-10-06 DIAGNOSIS — E7849 Other hyperlipidemia: Secondary | ICD-10-CM

## 2021-10-06 DIAGNOSIS — G4733 Obstructive sleep apnea (adult) (pediatric): Secondary | ICD-10-CM

## 2021-10-06 DIAGNOSIS — R7989 Other specified abnormal findings of blood chemistry: Secondary | ICD-10-CM | POA: Diagnosis not present

## 2021-10-06 DIAGNOSIS — F439 Reaction to severe stress, unspecified: Secondary | ICD-10-CM | POA: Diagnosis not present

## 2021-10-06 DIAGNOSIS — R6889 Other general symptoms and signs: Secondary | ICD-10-CM

## 2021-10-06 DIAGNOSIS — R5383 Other fatigue: Secondary | ICD-10-CM

## 2021-10-06 DIAGNOSIS — G4730 Sleep apnea, unspecified: Secondary | ICD-10-CM | POA: Diagnosis not present

## 2021-10-06 DIAGNOSIS — R7302 Impaired glucose tolerance (oral): Secondary | ICD-10-CM

## 2021-10-06 DIAGNOSIS — Z955 Presence of coronary angioplasty implant and graft: Secondary | ICD-10-CM

## 2021-10-06 DIAGNOSIS — G4709 Other insomnia: Secondary | ICD-10-CM | POA: Diagnosis not present

## 2021-10-06 DIAGNOSIS — Z7901 Long term (current) use of anticoagulants: Secondary | ICD-10-CM | POA: Diagnosis not present

## 2021-10-06 DIAGNOSIS — I25119 Atherosclerotic heart disease of native coronary artery with unspecified angina pectoris: Secondary | ICD-10-CM

## 2021-10-06 DIAGNOSIS — F419 Anxiety disorder, unspecified: Secondary | ICD-10-CM | POA: Diagnosis not present

## 2021-10-06 DIAGNOSIS — S72002D Fracture of unspecified part of neck of left femur, subsequent encounter for closed fracture with routine healing: Secondary | ICD-10-CM

## 2021-10-06 DIAGNOSIS — M48062 Spinal stenosis, lumbar region with neurogenic claudication: Secondary | ICD-10-CM

## 2021-10-06 DIAGNOSIS — E611 Iron deficiency: Secondary | ICD-10-CM

## 2021-10-06 DIAGNOSIS — R0683 Snoring: Secondary | ICD-10-CM | POA: Diagnosis not present

## 2021-10-06 NOTE — Progress Notes (Signed)
Subjective:    Patient ID: Carrie Chung, female    DOB: January 22, 1941, 80 y.o.   MRN: [id number]  HPI 80 year old Female seen for follow up on left hip Fx and other issues. she was seen in September 14 for health maintenance exam and annual Medicare wellness visit.  Suffered left femoral neck fracture with a fall  June 30.    Cologuard was ordered but has not been returned.  Order was placed September 14.  Able to walk with a walker and  is now driving.   Husband not able to drive- has early dementia.  Not drinking any alcohol. Has gotten some pain med from orthopedist.  Had constant mild nausea after discharge from hospital. This has improved.  Was found to have iron deficiency recently on labs October 17.  Iron level was 40.  Hemoglobin decreased to a low of 8 g July 14.  MCV was elevated in July at 101.2.  Currently B12 and folate levels are normal.  Fasting glucose is 101.  BUN and creatinine are normal.  Electrolytes and liver functions are normal.  TSH and free T4 normal.  She looks well.  Obviously under stress with her husband.  It has helped to start driving again.  She has history of coronary artery disease, atrial tachycardia, hypertension, anxiety and depression.  Had Medicare wellness visit [place] in September.  Hip fracture was  a comminuted impacted acute left femoral intertrochanteric fracture.  Underwent ORIF.  Tolerated procedure well .  Went to inpatient rehab before discharge home.  Continues with outpatient rehab at [facility].  Patient continues with anxiety and is having issues sleeping.  Have prescribed Xanax 1 mg at bedtime as needed.  She is having considerable hip pain still and gave her small quantity of hydrocodone APAP 5/325 number 15 tablets to take sparingly up to every 8 hours as needed for hip pain.  Remains on Norvasc for hypertension and metoprolol.  History of GE reflux treated with Nexium.  History of coronary artery  disease status post PTCA with drug-eluting stent x1 to the mid RCA in April 2013.  She returned to the Cath Lab and had stents placed in the mid LAD and proximal circumflex arteries.  She is followed by Dr. Burt Knack, cardiologist.  History of mild cognitive deficits followed by neurology.  History of lumbar back pain.  History of impaired glucose tolerance which is mild.  Quit smoking in 1982.  Social history: Husband is a retired [employer] manager.  Patient has an adult daughter and 2 grandchildren that live here in [place].  Social alcohol consumption.  Hyperlipidemia treated with Crestor 20 mg daily.  Depression treated with Effexor XR 75 mg daily.      Review of Systems reports weight loss but not losing weight on purpose     Objective:   Physical Exam Weight is 139 pounds BMI 27.15 pulse is 70 and regular.  Temperature 98.6 degrees blood pressure 108/62.  Previous weight in August 2021 was 157 pounds.  I am not surprised with her weight loss given situational stress and hip fracture.  We will continue to monitor this.       Assessment & Plan:  History of left hip fracture secondary to a fall  Essential hypertension-stable on current regimen  History of coronary artery disease status post MI with 3 stents placed in 2013.  Initially with acute MI and had drug-eluting stent x1 to the mid RCA.  Returned to  the Cath Lab on March 29, 2012 and had stents placed in the mid LAD and proximal circumflex arteries.  Depression-treated currently with Pristiq  History of mild neurological cognitive deficits  Anxiety and situational stress treated with Xanax at night for sleep.  Avoid alcohol while taking Xanax.  Also takes Pristiq.  Musculoskeletal pain-given small quantity of hydrocodone APAP to take sparingly at patient request.  Avoid alcohol while taking narcotic pain medication.  History of lumbar disc disease/spinal stenosis  History of impaired glucose tolerance  Iron  deficiency-iron level is 40.  She will return in January for follow-up and will take over-the-counter iron supplementation.  Anemia after fall resulting in hip fracture in July.  This is improved to normal at 13.5 g  Chronic anticoagulation with Plavix due to heart disease and stent placement  Plan: She will return in January for follow-up on iron deficiency.  She will continue with her physical therapy.

## 2021-10-07 ENCOUNTER — Ambulatory Visit (HOSPITAL_BASED_OUTPATIENT_CLINIC_OR_DEPARTMENT_OTHER): Payer: Medicare Other | Admitting: Physical Therapy

## 2021-10-07 ENCOUNTER — Encounter (HOSPITAL_BASED_OUTPATIENT_CLINIC_OR_DEPARTMENT_OTHER): Payer: Self-pay | Admitting: Physical Therapy

## 2021-10-07 DIAGNOSIS — R293 Abnormal posture: Secondary | ICD-10-CM

## 2021-10-07 DIAGNOSIS — M6281 Muscle weakness (generalized): Secondary | ICD-10-CM | POA: Diagnosis not present

## 2021-10-07 DIAGNOSIS — M25561 Pain in right knee: Secondary | ICD-10-CM | POA: Diagnosis not present

## 2021-10-07 DIAGNOSIS — R262 Difficulty in walking, not elsewhere classified: Secondary | ICD-10-CM

## 2021-10-07 DIAGNOSIS — R2689 Other abnormalities of gait and mobility: Secondary | ICD-10-CM

## 2021-10-07 DIAGNOSIS — G8929 Other chronic pain: Secondary | ICD-10-CM

## 2021-10-07 DIAGNOSIS — M25552 Pain in left hip: Secondary | ICD-10-CM | POA: Diagnosis not present

## 2021-10-07 DIAGNOSIS — R2681 Unsteadiness on feet: Secondary | ICD-10-CM

## 2021-10-07 LAB — COMPLETE METABOLIC PANEL WITH GFR
AG Ratio: 1.8 (calc) (ref 1.0–2.5)
ALT: 15 U/L (ref 6–29)
AST: 25 U/L (ref 10–35)
Albumin: 4.6 g/dL (ref 3.6–5.1)
Alkaline phosphatase (APISO): 76 U/L (ref 37–153)
BUN: 10 mg/dL (ref 7–25)
CO2: 32 mmol/L (ref 20–32)
Calcium: 9.8 mg/dL (ref 8.6–10.4)
Chloride: 98 mmol/L (ref 98–110)
Creat: 0.6 mg/dL (ref 0.60–0.95)
Globulin: 2.5 g/dL (calc) (ref 1.9–3.7)
Glucose, Bld: 101 mg/dL — ABNORMAL HIGH (ref 65–99)
Potassium: 3.9 mmol/L (ref 3.5–5.3)
Sodium: 142 mmol/L (ref 135–146)
Total Bilirubin: 0.3 mg/dL (ref 0.2–1.2)
Total Protein: 7.1 g/dL (ref 6.1–8.1)
eGFR: 90 mL/min/{1.73_m2} (ref >=60)

## 2021-10-07 LAB — CBC WITH DIFFERENTIAL/PLATELET
Absolute Monocytes: 320 cells/uL (ref 200–950)
Basophils Absolute: 29 cells/uL (ref 0–200)
Basophils Relative: 0.7 %
Eosinophils Absolute: 90 cells/uL (ref 15–500)
Eosinophils Relative: 2.2 %
HCT: 42.1 % (ref 35.0–45.0)
Hemoglobin: 13.5 g/dL (ref 11.7–15.5)
Lymphs Abs: 1341 cells/uL (ref 850–3900)
MCH: 27.2 pg (ref 27.0–33.0)
MCHC: 32.1 g/dL (ref 32.0–36.0)
MCV: 84.9 fL (ref 80.0–100.0)
MPV: 10.2 fL (ref 7.5–12.5)
Monocytes Relative: 7.8 %
Neutro Abs: 2321 cells/uL (ref 1500–7800)
Neutrophils Relative %: 56.6 %
Platelets: 202 10*3/uL (ref 140–400)
RBC: 4.96 10*6/uL (ref 3.80–5.10)
RDW: 13.5 % (ref 11.0–15.0)
Total Lymphocyte: 32.7 %
WBC: 4.1 10*3/uL (ref 3.8–10.8)

## 2021-10-07 LAB — IRON,TIBC AND FERRITIN PANEL
%SAT: 10 % (calc) — ABNORMAL LOW (ref 16–45)
Ferritin: 21 ng/mL (ref 16–288)
Iron: 40 ug/dL — ABNORMAL LOW (ref 45–160)
TIBC: 404 mcg/dL (calc) (ref 250–450)

## 2021-10-07 LAB — TSH: TSH: 2.1 mIU/L (ref 0.40–4.50)

## 2021-10-07 LAB — B12 AND FOLATE PANEL
Folate: >24 ng/mL
Vitamin B-12: >2000 pg/mL — ABNORMAL HIGH (ref 200–1100)

## 2021-10-07 LAB — T4, FREE: Free T4: 1.2 ng/dL (ref 0.8–1.8)

## 2021-10-07 NOTE — Therapy (Signed)
[place], [place], [address] Phone: [phone]   Fax:  [phone]  Physical Therapy Treatment  Patient Details  Name: Carrie Chung MRN: [id number] Date of Birth: 08/13/1941 Referring Provider (PT): Edmonia Lynch, MD   Encounter Date: 10/07/2021   PT End of Session - 10/07/21 1128     Visit Number 14    Number of Visits 6    Date for PT Re-Evaluation 12/05/21    Authorization Type MCR    Progress Note Due on Visit 27    PT Start Time 1116    PT Stop Time 1200    PT Time Calculation (min) 44 min    Equipment Utilized During Treatment Other (comment)    Activity Tolerance Patient tolerated treatment well    Behavior During Therapy [facility] for tasks assessed/performed             Past Medical History:  Diagnosis Date   Atrial tachycardia ([place])    a. noted at cardiac rehab 5/13; event monitor ordered to assess for AFib   CAD (coronary artery disease)    a. NSTEMI 03/2012 ([place] 03/27/12: pLAD 30%, mLAD 99%, mCFX 95%, mRCA 99% with thrombus, EF 65%);  s/p PTCA/DESx1 to prox-mid RCA 03/27/12 urgently in setting of hypotension/bradycardia, with staged PTCA/Evolve study stent to mid LAD & PTCA/Evolve study stent to prox LCx 03/29/12 ;   echo 03/28/12:EF 60%, Aortic sclerosis without AS, mild RVE, mild reduced RVSF     Endometrial polyp    HTN (hypertension)    Hyperlipidemia    MI, old    Sleep apnea     Past Surgical History:  Procedure Laterality Date   APPENDECTOMY  80 years old   CAROTID STENT  04/2012   x3   CESAREAN SECTION  1984   CHOLECYSTECTOMY  1978   INTRAMEDULLARY (IM) NAIL INTERTROCHANTERIC Left 06/20/2021   Procedure: INTRAMEDULLARY (IM) NAIL INTERTROCHANTRIC;  Surgeon: Renette Butters, MD;  Location: [facility];  Service: Orthopedics;  Laterality: Left;   LEFT HEART CATHETERIZATION WITH CORONARY ANGIOGRAM N/A 03/27/2012   Procedure: LEFT HEART CATHETERIZATION WITH CORONARY ANGIOGRAM;   Surgeon: Burnell Blanks, MD;  Location[facility] C[facility] LAB;  Service: Cardiovascular;  Laterality: N/A;   PERCUTANEOUS CORONARY STENT INTERVENTION (PCI-S) N/A 03/27/2012   Procedure: PERCUTANEOUS CORONARY STENT INTERVENTION (PCI-S);  Surgeon: Burnell Blanks, MD;  Location: [facility] CATH LAB;  Service: Cardiovascular;  Laterality: N/A;   PERCUTANEOUS CORONARY STENT INTERVENTION (PCI-S) N/A 03/29/2012   Procedure: PERCUTANEOUS CORONARY STENT INTERVENTION (PCI-S);  Surgeon: [name], MD;  Location: [facility] CATH LAB;  Service: Cardiovascular;  Laterality: N/A;   TONSILLECTOMY  80 years old    There were no vitals filed for this visit.   Subjective Assessment - 10/07/21 1213     Subjective "I was pretty soar after last session although felt better stretched out"    Currently in Pain? Yes    Pain Score 5     Pain Location Hip    Pain Orientation Left;Lateral    Pain Descriptors / Indicators Aching    Pain Onset More than a month ago    Pain Frequency Intermittent                                          PT Short Term Goals - 09/23/21 1114       PT SHORT  TERM GOAL #1   Title pt will demo hip flexion, abd and extension strength at least 4/5    Baseline see flowsheet    Status On-going      PT SHORT TERM GOAL #2   Title pt will be safe in household ambulation with SPC    Baseline it depends where I am, tend to use walker just to help carry things- probably use it more than I should, and use the walker because of single step down level in the house    Status Partially Met      PT SHORT TERM GOAL #3   Title pt will be able to navigate stairs with use of hand rail and SPC to go upstairs in her home    Baseline I did the stairs going up and was terrified to go down but I did it, I still try to avoid it though, frankie taught me how to properly have somebody assist me    Status Partially Met               PT Long Term Goals - 09/16/21 1257       PT  LONG TERM GOAL #3   Baseline 9/27: pt has made breakfast stanidng    Status On-going               Pt seen for aquatic therapy today.  Treatment took place in water 3.25-4.8 ft in depth at the [facility] pool. Temp of water was 91.  Pt entered/exited the pool via stairs step to pattern supervisionwith bilat rail.     Warm up forward, backward and side stepping/walking cues for increased step length, increased speed, hand placement to increase resistance. 6 laps each without UE support.  Seated  Hamstring and gastroc stretch -deep pressure massage to hip flex (pectineus and adductor)   Suspended in supine gentle Bad Ragaz technique used to encourage relaxation.  -massage and trigger point release left hip flex -hip extension x 10 R/L -manual left hip flexor stretch as tolerated Verbal and TC for  glute activation   Standing -backward lunge x 10 -hip extension R/L x 10 Hip flex stretch using noodle 3x30 sec   Forward, backward and side stepping 2 widths each after stretching and manual technique left hip flex       Pt requires buoyancy for support and to offload joints with strengthening exercises. Viscosity of the water is needed for resistance of strengthening; water current perturbations provides challenge to standing balance unsupported, requiring increased core activation        Plan - 10/07/21 1209     Clinical Impression Statement Decreased tightness in pectineus and adductor with palaption today.  pt reports continued discomfort but seems to have moved further down into belly of quad. She reports compliance with home deep pressure/massage to hip flexors as instruted last visit.  Focused on strecthing of left hip into extension along with strengtheing glute. left hip extension ROM ~40% of right.    Stability/Clinical Decision Making Stable/Uncomplicated    Clinical Decision Making Low    Rehab Potential Good    PT Frequency 2x / week    PT Duration  Other (comment)    PT Treatment/Interventions ADLs/Self Care Home Management;Aquatic Therapy;Cryotherapy;Electrical Stimulation;Gait training;Ultrasound;Traction;Moist Heat;Stair training;Functional mobility training;Therapeutic activities;Therapeutic exercise;Balance training;Neuromuscular re-education;Manual techniques;Patient/family education;Passive range of motion;Dry needling;Taping    PT Next Visit Plan [place]  Patient will benefit from skilled therapeutic intervention in order to improve the following deficits and impairments:  Abnormal gait, Decreased range of motion, Difficulty walking, Decreased activity tolerance, Pain, Improper body mechanics, Impaired flexibility, Decreased balance, Decreased scar mobility, Decreased strength, Postural dysfunction  Visit Diagnosis: Abnormal posture  Chronic pain of right knee  Difficulty in walking, not elsewhere classified  Muscle weakness (generalized)  Unsteadiness on feet  Other abnormalities of gait and mobility     Problem List Patient Active Problem List   Diagnosis Date Noted   Pain due to onychomycosis of toenails of both feet 08/22/2021   Intertrochanteric fracture of left hip ([place]) 06/24/2021   Closed left hip fracture, initial encounter ([place]) 06/20/2021   Hypokalemia 06/20/2021   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   Cognitive complaints 06/02/2016   Hypoxemic respiratory failure, chronic ([place]) 09/19/2015   MCI (mild cognitive impairment) 03/06/2015   Depression with anxiety 03/06/2015   Bullae 07/02/2014   History of MRSA infection 11/20/2012   Low back pain 07/07/2012   Insomnia 04/28/2012   Coronary Artery Disease 04/19/2012   Hyperlipidemia 04/19/2012   Anxiety 04/19/2012   Non-ST elevation myocardial infarction (NSTEMI), initial care episode ([place]) 03/28/2012   HYPERTENSION, BENIGN 06/12/2010   Dyspnea 06/12/2010   Prolonged QT interval 06/12/2010     Annamarie Major) Linn Goetze MPT 10/07/2021, 12:14 PM  [facility] [address] [place], [place], [address] Phone: [phone]   Fax:  [phone]  Name: Carrie Chung MRN: [id number] Date of Birth: April 17, 1941

## 2021-10-10 ENCOUNTER — Other Ambulatory Visit: Payer: Self-pay

## 2021-10-10 ENCOUNTER — Encounter (HOSPITAL_BASED_OUTPATIENT_CLINIC_OR_DEPARTMENT_OTHER): Payer: Self-pay | Admitting: Physical Therapy

## 2021-10-10 ENCOUNTER — Ambulatory Visit (HOSPITAL_BASED_OUTPATIENT_CLINIC_OR_DEPARTMENT_OTHER): Payer: Medicare Other | Admitting: Physical Therapy

## 2021-10-10 DIAGNOSIS — G8929 Other chronic pain: Secondary | ICD-10-CM

## 2021-10-10 DIAGNOSIS — M25561 Pain in right knee: Secondary | ICD-10-CM | POA: Diagnosis not present

## 2021-10-10 DIAGNOSIS — M6281 Muscle weakness (generalized): Secondary | ICD-10-CM | POA: Diagnosis not present

## 2021-10-10 DIAGNOSIS — R2689 Other abnormalities of gait and mobility: Secondary | ICD-10-CM

## 2021-10-10 DIAGNOSIS — R2681 Unsteadiness on feet: Secondary | ICD-10-CM

## 2021-10-10 DIAGNOSIS — R293 Abnormal posture: Secondary | ICD-10-CM

## 2021-10-10 DIAGNOSIS — R262 Difficulty in walking, not elsewhere classified: Secondary | ICD-10-CM

## 2021-10-10 DIAGNOSIS — M25552 Pain in left hip: Secondary | ICD-10-CM | POA: Diagnosis not present

## 2021-10-10 NOTE — Therapy (Signed)
[place], [place], [address] Phone: [phone]   Fax:  [phone]  Physical Therapy Treatment  Patient Details  Name: Carrie Chung MRN: [id number] Date of Birth: December 14, 1941 Referring Provider (PT): Edmonia Lynch, MD   Encounter Date: 10/10/2021   PT End of Session - 10/10/21 1133     Visit Number 15    Number of Visits 33    Date for PT Re-Evaluation 12/05/21    Authorization Type MCR    PT Start Time 1117    PT Stop Time 1200    PT Time Calculation (min) 43 min             Past Medical History:  Diagnosis Date   Atrial tachycardia ([place])    a. noted at cardiac rehab 5/13; event monitor ordered to assess for AFib   CAD (coronary artery disease)    a. NSTEMI 03/2012 ([place] 03/27/12: pLAD 30%, mLAD 99%, mCFX 95%, mRCA 99% with thrombus, EF 65%);  s/p PTCA/DESx1 to prox-mid RCA 03/27/12 urgently in setting of hypotension/bradycardia, with staged PTCA/Evolve study stent to mid LAD & PTCA/Evolve study stent to prox LCx 03/29/12 ;   echo 03/28/12:EF 60%, Aortic sclerosis without AS, mild RVE, mild reduced RVSF     Endometrial polyp    HTN (hypertension)    Hyperlipidemia    MI, old    Sleep apnea     Past Surgical History:  Procedure Laterality Date   APPENDECTOMY  80 years old   CAROTID STENT  04/2012   x3   CESAREAN SECTION  1984   CHOLECYSTECTOMY  1978   INTRAMEDULLARY (IM) NAIL INTERTROCHANTERIC Left 06/20/2021   Procedure: INTRAMEDULLARY (IM) NAIL INTERTROCHANTRIC;  Surgeon: Renette Butters, MD;  Location: [place];  Service: Orthopedics;  Laterality: Left;   LEFT HEART CATHETERIZATION WITH CORONARY ANGIOGRAM N/A 03/27/2012   Procedure: LEFT HEART CATHETERIZATION WITH CORONARY ANGIOGRAM;  Surgeon: Burnell Blanks, MD;  Location: [facility] CATH LAB;  Service: Cardiovascular;  Laterality: N/A;   PERCUTANEOUS CORONARY STENT INTERVENTION (PCI-S) N/A 03/27/2012   Procedure: PERCUTANEOUS CORONARY STENT  INTERVENTION (PCI-S);  Surgeon: Burnell Blanks, MD;  Location: [facility] CATH LAB;  Service: Cardiovascular;  Laterality: N/A;   PERCUTANEOUS CORONARY STENT INTERVENTION (PCI-S) N/A 03/29/2012   Procedure: PERCUTANEOUS CORONARY STENT INTERVENTION (PCI-S);  Surgeon: [name], MD;  Location: [facility] CATH LAB;  Service: Cardiovascular;  Laterality: N/A;   TONSILLECTOMY  80 years old    There were no vitals filed for this visit.   Subjective Assessment - 10/10/21 1133     Subjective "My left leg hurt me from the outside of my hip towards my knee last night"                                          PT Short Term Goals - 09/23/21 1114       PT SHORT TERM GOAL #1   Title pt will demo hip flexion, abd and extension strength at least 4/5    Baseline see flowsheet    Status On-going      PT SHORT TERM GOAL #2   Title pt will be safe in household ambulation with SPC    Baseline it depends where I am, tend to use walker just to help carry things- probably use it more than I should, and use the walker because of single  step down level in the house    Status Partially Met      PT SHORT TERM GOAL #3   Title pt will be able to navigate stairs with use of hand rail and SPC to go upstairs in her home    Baseline I did the stairs going up and was terrified to go down but I did it, I still try to avoid it though, frankie taught me how to properly have somebody assist me    Status Partially Met               PT Long Term Goals - 09/16/21 1257       PT LONG TERM GOAL #3   Baseline 9/27: pt has made breakfast stanidng    Status On-going             Pt seen for aquatic therapy today.  Treatment took place in water 3.25-4.8 ft in depth at the [employer] pool. Temp of water was 91.  Pt entered/exited the pool via stairs step to pattern supervisionwith bilat rail.     Warm up forward, backward and side stepping/walking cues for increased step  length, increased speed, hand placement to increase resistance. 6 laps each without UE support.   Seated  Hamstring and gastroc stretch -deep pressure massage to hip flex (pectineus and adductor)   Suspended in supine gentle Bad Ragaz technique used to encourage relaxation.  -massage and trigger point release left hip glute -hip extension with tc for activation of left glute/s   Verbal and TC for  glute activation   Standing -manual left hip flexor stretch as tolerated -hip extension x 10 -LB stretch in pike position at steps   Forward, backward and side stepping 2 widths each warm down.   Pt requires buoyancy for support and to offload joints with strengthening exercises. Viscosity of the water is needed for resistance of strengthening; water current perturbations provides challenge to standing balance unsupported, requiring increased core activation      Plan - 10/10/21 1200     Clinical Impression Statement Pt with tightness and trigger points in left glute resovled at least 50% with manual techniques. In supine suspension pt with delayed glute activation as demo by hip extension but improves with repitition.             Patient will benefit from skilled therapeutic intervention in order to improve the following deficits and impairments:     Visit Diagnosis: Abnormal posture  Chronic pain of right knee  Difficulty in walking, not elsewhere classified  Muscle weakness (generalized)  Unsteadiness on feet  Other abnormalities of gait and mobility     Problem List Patient Active Problem List   Diagnosis Date Noted   Pain due to onychomycosis of toenails of both feet 08/22/2021   Intertrochanteric fracture of left hip ([place]) 06/24/2021   Closed left hip fracture, initial encounter ([place]) 06/20/2021   Hypokalemia 06/20/2021   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   Cognitive complaints 06/02/2016   Hypoxemic respiratory failure, chronic ([place]) 09/19/2015   MCI (mild  cognitive impairment) 03/06/2015   Depression with anxiety 03/06/2015   Bullae 07/02/2014   History of MRSA infection 11/20/2012   Low back pain 07/07/2012   Insomnia 04/28/2012   Coronary Artery Disease 04/19/2012   Hyperlipidemia 04/19/2012   Anxiety 04/19/2012   Non-ST elevation myocardial infarction (NSTEMI), initial care episode ([place]) 03/28/2012   HYPERTENSION, BENIGN 06/12/2010   Dyspnea 06/12/2010   Prolonged QT interval 06/12/2010  [address] [place])  MPT 10/10/2021, 1:52 PM  [facility] [address] [place], [place], [address] Phone: [phone]   Fax:  [phone]  Name: Carrie Chung MRN: [id number] Date of Birth: [birth year]

## 2021-10-14 ENCOUNTER — Encounter (HOSPITAL_BASED_OUTPATIENT_CLINIC_OR_DEPARTMENT_OTHER): Payer: Medicare Other | Attending: Physical Medicine and Rehabilitation | Admitting: Physical Therapy

## 2021-10-14 ENCOUNTER — Other Ambulatory Visit: Payer: Self-pay

## 2021-10-14 ENCOUNTER — Encounter (HOSPITAL_BASED_OUTPATIENT_CLINIC_OR_DEPARTMENT_OTHER): Payer: Self-pay | Admitting: Physical Therapy

## 2021-10-14 DIAGNOSIS — R2689 Other abnormalities of gait and mobility: Secondary | ICD-10-CM | POA: Diagnosis not present

## 2021-10-14 DIAGNOSIS — M6281 Muscle weakness (generalized): Secondary | ICD-10-CM

## 2021-10-14 DIAGNOSIS — R2681 Unsteadiness on feet: Secondary | ICD-10-CM | POA: Diagnosis not present

## 2021-10-14 DIAGNOSIS — M25561 Pain in right knee: Secondary | ICD-10-CM | POA: Insufficient documentation

## 2021-10-14 DIAGNOSIS — R262 Difficulty in walking, not elsewhere classified: Secondary | ICD-10-CM | POA: Diagnosis not present

## 2021-10-14 DIAGNOSIS — G8929 Other chronic pain: Secondary | ICD-10-CM

## 2021-10-14 DIAGNOSIS — R293 Abnormal posture: Secondary | ICD-10-CM

## 2021-10-14 NOTE — Therapy (Signed)
[place], [place], [address] Phone: [phone]   Fax:  [phone]  Physical Therapy Treatment  Patient Details  Name: Carrie Chung MRN: [id number] Date of Birth: 1941/04/15 Referring Provider (PT): Edmonia Lynch, MD   Encounter Date: 10/14/2021   PT End of Session - 10/14/21 1130     Visit Number 16    Number of Visits 33    Date for PT Re-Evaluation 12/05/21    Authorization Type MCR    PT Start Time 1116    PT Stop Time 1201    PT Time Calculation (min) 45 min    Equipment Utilized During Treatment Other (comment)    Activity Tolerance Patient tolerated treatment well    Behavior During Therapy [facility] for tasks assessed/performed             Past Medical History:  Diagnosis Date   Atrial tachycardia ([place])    a. noted at cardiac rehab 5/13; event monitor ordered to assess for AFib   CAD (coronary artery disease)    a. NSTEMI 03/2012 ([place] 03/27/12: pLAD 30%, mLAD 99%, mCFX 95%, mRCA 99% with thrombus, EF 65%);  s/p PTCA/DESx1 to prox-mid RCA 03/27/12 urgently in setting of hypotension/bradycardia, with staged PTCA/Evolve study stent to mid LAD & PTCA/Evolve study stent to prox LCx 03/29/12 ;   echo 03/28/12:EF 60%, Aortic sclerosis without AS, mild RVE, mild reduced RVSF     Endometrial polyp    HTN (hypertension)    Hyperlipidemia    MI, old    Sleep apnea     Past Surgical History:  Procedure Laterality Date   APPENDECTOMY  80 years old   CAROTID STENT  04/2012   x3   CESAREAN SECTION  1984   CHOLECYSTECTOMY  1978   INTRAMEDULLARY (IM) NAIL INTERTROCHANTERIC Left 06/20/2021   Procedure: INTRAMEDULLARY (IM) NAIL INTERTROCHANTRIC;  Surgeon: Renette Butters, MD;  Location: [place];  Service: Orthopedics;  Laterality: Left;   LEFT HEART CATHETERIZATION WITH CORONARY ANGIOGRAM N/A 03/27/2012   Procedure: LEFT HEART CATHETERIZATION WITH CORONARY ANGIOGRAM;  Surgeon: Burnell Blanks, MD;   Location: [facility] CATH LAB;  Service: Cardiovascular;  Laterality: N/A;   PERCUTANEOUS CORONARY STENT INTERVENTION (PCI-S) N/A 03/27/2012   Procedure: PERCUTANEOUS CORONARY STENT INTERVENTION (PCI-S);  Surgeon: Burnell Blanks, MD;  Location: [facility] CATH LAB;  Service: Cardiovascular;  Laterality: N/A;   PERCUTANEOUS CORONARY STENT INTERVENTION (PCI-S) N/A 03/29/2012   Procedure: PERCUTANEOUS CORONARY STENT INTERVENTION (PCI-S);  Surgeon: [name], MD;  Location: [facility] CATH LAB;  Service: Cardiovascular;  Laterality: N/A;   TONSILLECTOMY  80 years old    There were no vitals filed for this visit.   Subjective Assessment - 10/14/21 1134     Subjective "sitting and walking pain is now about the same."    Pain Score 2     Pain Orientation Left    Pain Descriptors / Indicators Aching    Pain Type Surgical pain    Pain Onset More than a month ago    Pain Frequency Intermittent                                          PT Short Term Goals - 10/14/21 1203       PT SHORT TERM GOAL #3   Baseline Pt climbing stairs using handrail and cane in home and at relatives  home indep    Status Achieved               PT Long Term Goals - 09/16/21 1257       PT LONG TERM GOAL #3   Baseline 9/27: pt has made breakfast stanidng    Status On-going            Pt seen for aquatic therapy today.  Treatment took place in water 3.25-4.8 ft in depth at the [employer] pool. Temp of water was 91.  Pt entered/exited the pool via stairs step to pattern supervisionwith bilat rail.     Warm up forward, backward and side stepping/walking cues for increased step length, increased speed, hand placement to increase resistance. 4 widths each. 4 widths with hand buoys submerged for core engagement.   Seated  Hamstring and gastroc stretch -deep pressure massage to hip flex (pectineus and adductor/resolving)   Standing -manual assist R/L hip flexor stretch  -quad  stretch bilat x 10 With ankle buoys -hip extension x 10 -add/abd x 10 -hip flex x10 -marching x10   Forward, backward walking 2-3 trials between exercises for recovery and stretching  Side stepping  and backward x 4 widths hand buoys for support, ankle buoys donned   Pt requires buoyancy for support and to offload joints with strengthening exercises. Viscosity of the water is needed for resistance of strengthening; water current perturbations provides challenge to standing balance unsupported, requiring increased core activation       Plan - 10/14/21 1135     Clinical Impression Statement Pt reports driving and using cane in and out of her home including climbing steps daily.  Continues to use walker in home due to distances walked in home. Hip flexors with continued decrease in tightness. Focused back on adductor stretching and added resisted hip/LE exercises which challenged pt,  She does c/o some  left groin discomfort with adduction in standing.  Overall frequency of higher pain levels  decreased.  Reporting she is still unable to tolerate sidelying.    Stability/Clinical Decision Making Stable/Uncomplicated    Clinical Decision Making Low    Rehab Potential Good    PT Frequency 2x / week    PT Treatment/Interventions ADLs/Self Care Home Management;Aquatic Therapy;Cryotherapy;Electrical Stimulation;Gait training;Ultrasound;Traction;Moist Heat;Stair training;Functional mobility training;Therapeutic activities;Therapeutic exercise;Balance training;Neuromuscular re-education;Manual techniques;Patient/family education;Passive range of motion;Dry needling;Taping    PT Next Visit Plan progress strngthenng ex with ankle buoys.    PT Home Exercise Plan [id number]             Patient will benefit from skilled therapeutic intervention in order to improve the following deficits and impairments:  Abnormal gait, Decreased range of motion, Difficulty walking, Decreased activity tolerance, Pain,  Improper body mechanics, Impaired flexibility, Decreased balance, Decreased scar mobility, Decreased strength, Postural dysfunction  Visit Diagnosis: Abnormal posture  Other abnormalities of gait and mobility  Unsteadiness on feet  Chronic pain of right knee  Difficulty in walking, not elsewhere classified  Muscle weakness (generalized)     Problem List Patient Active Problem List   Diagnosis Date Noted   Pain due to onychomycosis of toenails of both feet 08/22/2021   Intertrochanteric fracture of left hip ([place]) 06/24/2021   Closed left hip fracture, initial encounter ([place]) 06/20/2021   Hypokalemia 06/20/2021   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   Cognitive complaints 06/02/2016   Hypoxemic respiratory failure, chronic ([place]) 09/19/2015   MCI (mild cognitive impairment) 03/06/2015   Depression with anxiety 03/06/2015   Bullae 07/02/2014  History of MRSA infection 11/20/2012   Low back pain 07/07/2012   Insomnia 04/28/2012   Coronary Artery Disease 04/19/2012   Hyperlipidemia 04/19/2012   Anxiety 04/19/2012   Non-ST elevation myocardial infarction (NSTEMI), initial care episode ([place]) 03/28/2012   HYPERTENSION, BENIGN 06/12/2010   Dyspnea 06/12/2010   Prolonged QT interval 06/12/2010    Annamarie Major) Kadyn Chovan MPT 10/14/2021, 12:10 PM  [facility] [address] [place], [place], [address] Phone: [phone]   Fax:  [phone]  Name: CAITLAN CHAUCA MRN: [id number] Date of Birth: 06/19/1941

## 2021-10-17 ENCOUNTER — Encounter (HOSPITAL_BASED_OUTPATIENT_CLINIC_OR_DEPARTMENT_OTHER): Payer: Self-pay | Admitting: Physical Therapy

## 2021-10-17 ENCOUNTER — Ambulatory Visit (HOSPITAL_BASED_OUTPATIENT_CLINIC_OR_DEPARTMENT_OTHER): Payer: Medicare Other | Admitting: Physical Therapy

## 2021-10-17 ENCOUNTER — Other Ambulatory Visit: Payer: Self-pay

## 2021-10-17 DIAGNOSIS — R293 Abnormal posture: Secondary | ICD-10-CM

## 2021-10-17 DIAGNOSIS — M25552 Pain in left hip: Secondary | ICD-10-CM | POA: Diagnosis not present

## 2021-10-17 DIAGNOSIS — G8929 Other chronic pain: Secondary | ICD-10-CM | POA: Diagnosis not present

## 2021-10-17 DIAGNOSIS — R2681 Unsteadiness on feet: Secondary | ICD-10-CM

## 2021-10-17 DIAGNOSIS — M6281 Muscle weakness (generalized): Secondary | ICD-10-CM

## 2021-10-17 DIAGNOSIS — M25561 Pain in right knee: Secondary | ICD-10-CM | POA: Diagnosis not present

## 2021-10-17 DIAGNOSIS — R2689 Other abnormalities of gait and mobility: Secondary | ICD-10-CM

## 2021-10-17 DIAGNOSIS — R262 Difficulty in walking, not elsewhere classified: Secondary | ICD-10-CM

## 2021-10-17 NOTE — Therapy (Signed)
[place], [place], [address] Phone: [phone]   Fax:  [phone]  Physical Therapy Treatment  Patient Details  Name: Carrie Chung MRN: [id number] Date of Birth: [birth year] Referring Provider (PT): Edmonia Lynch, MD   Encounter Date: 10/17/2021   PT End of Session - 10/17/21 1128     Visit Number 17    Number of Visits 33    Date for PT Re-Evaluation 12/05/21    Authorization Type MCR    PT Start Time 1118    PT Stop Time 1159    PT Time Calculation (min) 41 min    Equipment Utilized During Treatment Other (comment)    Activity Tolerance Patient tolerated treatment well    Behavior During Therapy [facility] for tasks assessed/performed             Past Medical History:  Diagnosis Date   Atrial tachycardia ([place])    a. noted at cardiac rehab 5/13; event monitor ordered to assess for AFib   CAD (coronary artery disease)    a. NSTEMI 03/2012 ([place] 03/27/12: pLAD 30%, mLAD 99%, mCFX 95%, mRCA 99% with thrombus, EF 65%);  s/p PTCA/DESx1 to prox-mid RCA 03/27/12 urgently in setting of hypotension/bradycardia, with staged PTCA/Evolve study stent to mid LAD & PTCA/Evolve study stent to prox LCx 03/29/12 ;   echo 03/28/12:EF 60%, Aortic sclerosis without AS, mild RVE, mild reduced RVSF     Endometrial polyp    HTN (hypertension)    Hyperlipidemia    MI, old    Sleep apnea     Past Surgical History:  Procedure Laterality Date   APPENDECTOMY  80 years old   CAROTID STENT  04/2012   x3   CESAREAN SECTION  1984   CHOLECYSTECTOMY  1978   INTRAMEDULLARY (IM) NAIL INTERTROCHANTERIC Left 06/20/2021   Procedure: INTRAMEDULLARY (IM) NAIL INTERTROCHANTRIC;  Surgeon: Renette Butters, MD;  Location: [place];  Service: Orthopedics;  Laterality: Left;   LEFT HEART CATHETERIZATION WITH CORONARY ANGIOGRAM N/A 03/27/2012   Procedure: LEFT HEART CATHETERIZATION WITH CORONARY ANGIOGRAM;  Surgeon: Burnell Blanks, MD;   Location: [facility] CATH LAB;  Service: Cardiovascular;  Laterality: N/A;   PERCUTANEOUS CORONARY STENT INTERVENTION (PCI-S) N/A 03/27/2012   Procedure: PERCUTANEOUS CORONARY STENT INTERVENTION (PCI-S);  Surgeon: Burnell Blanks, MD;  Location: [facility] CATH LAB;  Service: Cardiovascular;  Laterality: N/A;   PERCUTANEOUS CORONARY STENT INTERVENTION (PCI-S) N/A 03/29/2012   Procedure: PERCUTANEOUS CORONARY STENT INTERVENTION (PCI-S);  Surgeon: [name], MD;  Location: [facility] CATH LAB;  Service: Cardiovascular;  Laterality: N/A;   TONSILLECTOMY  80 years old    There were no vitals filed for this visit.   Subjective Assessment - 10/17/21 1131     Subjective the pain left  groin and hip have moved down a bit.                                          PT Short Term Goals - 10/14/21 1203       PT SHORT TERM GOAL #3   Baseline Pt climbing stairs using handrail and cane in home and at relatives home indep    Status Achieved               PT Long Term Goals - 09/16/21 1257       PT LONG TERM GOAL #3  Baseline 9/27: pt has made breakfast stanidng    Status On-going           Pt seen for aquatic therapy today.  Treatment took place in water 3.25-4.8 ft in depth at the [facility] pool. Temp of water was 91.  Pt entered/exited the pool via stairs step to pattern supervisionwith bilat rail.     Warm up forward, backward and side stepping/walking cues for increased step length, increased speed, hand placement to increase resistance. 4 widths each. 4 widths with hand buoys submerged for core engagement.    Supine suspension: neck, belt and ankle buoys/noodles -massage/deep pressure to left hip flex -manual left hip ext rotation stretching -Hip ext for glut strength and hip flex stretch -knee flex -add/abd  Forward and backward lunges x 6 widths    Pt requires buoyancy for support and to offload joints with strengthening exercises. Viscosity  of the water is needed for resistance of strengthening; water current perturbations provides challenge to standing balance unsupported, requiring increased core activation        Plan - 10/17/21 1305     Clinical Impression Statement Discomfort distal from initial area.  With palpation there continues to be less ms tightness. Focus today on left external rotation of hip which is lacking due to muscle tightness and glute strength. Of note: pt with pain in hip flex due to muscle spasm with left hip extension (in supine suspension psoition).  Upon completion pt with improved abd and external rotation of hip and less discomfort.    Stability/Clinical Decision Making Stable/Uncomplicated    Clinical Decision Making Low    Rehab Potential Good    PT Frequency 2x / week    PT Treatment/Interventions ADLs/Self Care Home Management;Aquatic Therapy;Cryotherapy;Electrical Stimulation;Gait training;Ultrasound;Traction;Moist Heat;Stair training;Functional mobility training;Therapeutic activities;Therapeutic exercise;Balance training;Neuromuscular re-education;Manual techniques;Patient/family education;Passive range of motion;Dry needling;Taping    PT Next Visit Plan progress strngthenng ex with ankle buoys.    PT Home Exercise Plan [id number]             Patient will benefit from skilled therapeutic intervention in order to improve the following deficits and impairments:  Abnormal gait, Decreased range of motion, Difficulty walking, Decreased activity tolerance, Pain, Improper body mechanics, Impaired flexibility, Decreased balance, Decreased scar mobility, Decreased strength, Postural dysfunction  Visit Diagnosis: Abnormal posture  Chronic pain of right knee  Difficulty in walking, not elsewhere classified  Muscle weakness (generalized)  Unsteadiness on feet  Other abnormalities of gait and mobility     Problem List Patient Active Problem List   Diagnosis Date Noted   Pain due to  onychomycosis of toenails of both feet 08/22/2021   Intertrochanteric fracture of left hip ([place]) 06/24/2021   Closed left hip fracture, initial encounter ([place]) 06/20/2021   Hypokalemia 06/20/2021   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   Cognitive complaints 06/02/2016   Hypoxemic respiratory failure, chronic ([place]) 09/19/2015   MCI (mild cognitive impairment) 03/06/2015   Depression with anxiety 03/06/2015   Bullae 07/02/2014   History of MRSA infection 11/20/2012   Low back pain 07/07/2012   Insomnia 04/28/2012   Coronary Artery Disease 04/19/2012   Hyperlipidemia 04/19/2012   Anxiety 04/19/2012   Non-ST elevation myocardial infarction (NSTEMI), initial care episode ([place]) 03/28/2012   HYPERTENSION, BENIGN 06/12/2010   Dyspnea 06/12/2010   Prolonged QT interval 06/12/2010    Annamarie Major) Lessie Funderburke MPT 10/17/2021, 1:17 PM  [facility] [address] [place], [place], [address] Phone: [phone]   Fax:  [phone]  Name: Carrie Chung MRN: [id number] Date of Birth: 12/20/1941

## 2021-10-21 ENCOUNTER — Other Ambulatory Visit: Payer: Self-pay

## 2021-10-21 ENCOUNTER — Ambulatory Visit (HOSPITAL_BASED_OUTPATIENT_CLINIC_OR_DEPARTMENT_OTHER): Payer: Medicare Other | Attending: Physical Medicine and Rehabilitation | Admitting: Physical Therapy

## 2021-10-21 ENCOUNTER — Encounter (HOSPITAL_BASED_OUTPATIENT_CLINIC_OR_DEPARTMENT_OTHER): Payer: Self-pay | Admitting: Physical Therapy

## 2021-10-21 DIAGNOSIS — R262 Difficulty in walking, not elsewhere classified: Secondary | ICD-10-CM | POA: Diagnosis not present

## 2021-10-21 DIAGNOSIS — R2689 Other abnormalities of gait and mobility: Secondary | ICD-10-CM | POA: Insufficient documentation

## 2021-10-21 DIAGNOSIS — M25561 Pain in right knee: Secondary | ICD-10-CM | POA: Insufficient documentation

## 2021-10-21 DIAGNOSIS — M6281 Muscle weakness (generalized): Secondary | ICD-10-CM | POA: Insufficient documentation

## 2021-10-21 DIAGNOSIS — G8929 Other chronic pain: Secondary | ICD-10-CM | POA: Insufficient documentation

## 2021-10-21 DIAGNOSIS — R293 Abnormal posture: Secondary | ICD-10-CM | POA: Insufficient documentation

## 2021-10-21 DIAGNOSIS — R2681 Unsteadiness on feet: Secondary | ICD-10-CM | POA: Insufficient documentation

## 2021-10-21 NOTE — Therapy (Signed)
[place], [place], [address] Phone: [phone]   Fax:  [phone]  Physical Therapy Treatment  Patient Details  Name: Carrie Chung MRN: [id number] Date of Birth: 17-Sep-1941 Referring Provider (PT): Edmonia Lynch, MD   Encounter Date: 10/21/2021   PT End of Session - 10/21/21 1210     Visit Number 18    Number of Visits 33    Date for PT Re-Evaluation 12/05/21    Authorization Type MCR    PT Start Time 6606    PT Stop Time 1245    PT Time Calculation (min) 44 min    Equipment Utilized During Treatment Other (comment)    Activity Tolerance Patient tolerated treatment well    Behavior During Therapy [facility] for tasks assessed/performed             Past Medical History:  Diagnosis Date   Atrial tachycardia ([place])    a. noted at cardiac rehab 5/13; event monitor ordered to assess for AFib   CAD (coronary artery disease)    a. NSTEMI 03/2012 ([place] 03/27/12: pLAD 30%, mLAD 99%, mCFX 95%, mRCA 99% with thrombus, EF 65%);  s/p PTCA/DESx1 to prox-mid RCA 03/27/12 urgently in setting of hypotension/bradycardia, with staged PTCA/Evolve study stent to mid LAD & PTCA/Evolve study stent to prox LCx 03/29/12 ;   echo 03/28/12:EF 60%, Aortic sclerosis without AS, mild RVE, mild reduced RVSF     Endometrial polyp    HTN (hypertension)    Hyperlipidemia    MI, old    Sleep apnea     Past Surgical History:  Procedure Laterality Date   APPENDECTOMY  80 years old   CAROTID STENT  04/2012   x3   CESAREAN SECTION  1984   CHOLECYSTECTOMY  1978   INTRAMEDULLARY (IM) NAIL INTERTROCHANTERIC Left 06/20/2021   Procedure: INTRAMEDULLARY (IM) NAIL INTERTROCHANTRIC;  Surgeon: Renette Butters, MD;  Location: [place];  Service: Orthopedics;  Laterality: Left;   LEFT HEART CATHETERIZATION WITH CORONARY ANGIOGRAM N/A 03/27/2012   Procedure: LEFT HEART CATHETERIZATION WITH CORONARY ANGIOGRAM;  Surgeon: Burnell Blanks, MD;   Location: [facility] C[facility] LAB;  Service: Cardiovascular;  Laterality: N/A;   PERCUTANEOUS CORONARY STENT INTERVENTION (PCI-S) N/A 03/27/2012   Procedure: PERCUTANEOUS CORONARY STENT INTERVENTION (PCI-S);  Surgeon: Burnell Blanks, MD;  Location: [facility] CATH LAB;  Service: Cardiovascular;  Laterality: N/A;   PERCUTANEOUS CORONARY STENT INTERVENTION (PCI-S) N/A 03/29/2012   Procedure: PERCUTANEOUS CORONARY STENT INTERVENTION (PCI-S);  Surgeon: [name], MD;  Location: [facility] CATH LAB;  Service: Cardiovascular;  Laterality: N/A;   TONSILLECTOMY  80 years old    There were no vitals filed for this visit.   Subjective Assessment - 10/21/21 1211     Subjective "The worst part right now is that I am tired because I am not sleeping well."                                          PT Short Term Goals - 10/14/21 1203       PT SHORT TERM GOAL #3   Baseline Pt climbing stairs using handrail and cane in home and at relatives home indep    Status Achieved               PT Long Term Goals - 09/16/21 1257       PT LONG TERM  GOAL #3   Baseline 9/27: pt has made breakfast stanidng    Status On-going                Pt seen for aquatic therapy today.  Treatment took place in water 3.25-4.8 ft in depth at the [employer] pool. Temp of water was 91.  Pt entered/exited the pool via stairs step to pattern supervisionwith bilat rail.     Warm up forward, backward and side stepping/walking cues for increased step length, increased speed, hand placement to increase resistance. 4 widths each. 4 widths with hand buoys submerged for core engagement.  Stretching into hip extension using ankle buoys and manual assist    Standing With ankle buoys Add/abd 2x10 Hip flex 2x10 Hip Ext 2 x 10 Attempted to complete with ue support of bilat hand buoys but pt with difficulty executing exercises. Completed with holding to wall and 1 hand buoy  Kick board push downs  2x15. VC for core tightneing Step ups on bottom step initiated hoilding to handrails bilat then decreased to unilat -forward x10 R/L -sidestepping left only x 10. VC and demons for weight shift to properly execute. -backward L/R x 10 with difficulty, cga   Forward and backward lunges x 6 widths 2 foam hand buoys submerged for core engagement.   Pt requires buoyancy for support and to offload joints with strengthening exercises. Viscosity of the water is needed for resistance of strengthening; water current perturbations provides challenge to standing balance unsupported, requiring increased core activation       Plan - 10/21/21 1241     Clinical Impression Statement Little to no groin/hip flex soarness.  Massage to med and laterl distal quad, point of tightness.  Focused on stretching into lle hip extenion and knee flex. Although pt improving in left hip extension it continues to be lacking. More aggressive stretching tolerated with decreased hip flex and goin pain.  Increased balance challenges with pt holding to 2 foam hand buoys with ex but with difficulty.  Will continue to progress.    Stability/Clinical Decision Making Stable/Uncomplicated    Clinical Decision Making Low    Rehab Potential Good    PT Frequency 2x / week    PT Duration Other (comment)    PT Treatment/Interventions ADLs/Self Care Home Management;Aquatic Therapy;Cryotherapy;Electrical Stimulation;Gait training;Ultrasound;Traction;Moist Heat;Stair training;Functional mobility training;Therapeutic activities;Therapeutic exercise;Balance training;Neuromuscular re-education;Manual techniques;Patient/family education;Passive range of motion;Dry needling;Taping    PT Next Visit Plan L hip extension stretching/glut str    PT Home Exercise Plan [id number]             Patient will benefit from skilled therapeutic intervention in order to improve the following deficits and impairments:  Abnormal gait, Decreased range of  motion, Difficulty walking, Decreased activity tolerance, Pain, Improper body mechanics, Impaired flexibility, Decreased balance, Decreased scar mobility, Decreased strength, Postural dysfunction  Visit Diagnosis: Abnormal posture  Other abnormalities of gait and mobility  Unsteadiness on feet  Difficulty in walking, not elsewhere classified  Chronic pain of right knee  Muscle weakness (generalized)     Problem List Patient Active Problem List   Diagnosis Date Noted   Pain due to onychomycosis of toenails of both feet 08/22/2021   Intertrochanteric fracture of left hip ([place]) 06/24/2021   Closed left hip fracture, initial encounter ([place]) 06/20/2021   Hypokalemia 06/20/2021   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   Cognitive complaints 06/02/2016   Hypoxemic respiratory failure, chronic ([place]) 09/19/2015   MCI (mild cognitive impairment) 03/06/2015   Depression with  anxiety 03/06/2015   Bullae 07/02/2014   History of MRSA infection 11/20/2012   Low back pain 07/07/2012   Insomnia 04/28/2012   Coronary Artery Disease 04/19/2012   Hyperlipidemia 04/19/2012   Anxiety 04/19/2012   Non-ST elevation myocardial infarction (NSTEMI), initial care episode ([place]) 03/28/2012   HYPERTENSION, BENIGN 06/12/2010   Dyspnea 06/12/2010   Prolonged QT interval 06/12/2010    Annamarie Major) Leopold Smyers MPT 10/21/2021, 3:27 PM  [facility] [address] [place], [place], [address] Phone: [phone]   Fax:  [phone]  Name: DESIREE DAISE MRN: [id number] Date of Birth: 1941-06-08

## 2021-10-23 ENCOUNTER — Telehealth: Payer: Self-pay | Admitting: Internal Medicine

## 2021-10-23 ENCOUNTER — Encounter: Payer: Self-pay | Admitting: Internal Medicine

## 2021-10-23 MED ORDER — HYDROCODONE-ACETAMINOPHEN 5-325 MG PO TABS: ORAL_TABLET | ORAL | 0 refills | Status: DC

## 2021-10-23 NOTE — Telephone Encounter (Signed)
Called patient and let her know that RX had been sent in and this would be last time, she verbalized understanding.

## 2021-10-23 NOTE — Telephone Encounter (Signed)
Have sent in at pt request Norco 5/325 #15 tabs to take sparingly with PT musculoskeletal pain. This will be last refill. Should be getting better and we did this in September as well.

## 2021-10-23 NOTE — Telephone Encounter (Signed)
Aliciana Ricciardi  [phone]  Rosilyn called to see if you would give her some of the below medication to take at night for the pain from because physical therapy is getting intense and she thinks this would help her sleep.  HYDROcodone-acetaminophen (NORCO/VICODIN) 5-325 MG tablet  [id number] [address], [place] AT [address] Phone:  [phone]  Fax:  [phone]

## 2021-10-24 ENCOUNTER — Encounter (HOSPITAL_BASED_OUTPATIENT_CLINIC_OR_DEPARTMENT_OTHER): Payer: Self-pay | Admitting: Physical Therapy

## 2021-10-24 ENCOUNTER — Other Ambulatory Visit: Payer: Self-pay

## 2021-10-24 ENCOUNTER — Ambulatory Visit (HOSPITAL_BASED_OUTPATIENT_CLINIC_OR_DEPARTMENT_OTHER): Payer: Medicare Other | Admitting: Physical Therapy

## 2021-10-24 DIAGNOSIS — R2689 Other abnormalities of gait and mobility: Secondary | ICD-10-CM

## 2021-10-24 DIAGNOSIS — R293 Abnormal posture: Secondary | ICD-10-CM

## 2021-10-24 DIAGNOSIS — G8929 Other chronic pain: Secondary | ICD-10-CM

## 2021-10-24 DIAGNOSIS — R2681 Unsteadiness on feet: Secondary | ICD-10-CM

## 2021-10-24 DIAGNOSIS — R262 Difficulty in walking, not elsewhere classified: Secondary | ICD-10-CM | POA: Diagnosis not present

## 2021-10-24 DIAGNOSIS — M6281 Muscle weakness (generalized): Secondary | ICD-10-CM

## 2021-10-24 DIAGNOSIS — M25561 Pain in right knee: Secondary | ICD-10-CM

## 2021-10-24 NOTE — Therapy (Signed)
[place], [place], [address] Phone: [phone]   Fax:  [phone]  Physical Therapy Treatment  Patient Details  Name: Carrie Chung MRN: [id number] Date of Birth: 1941-11-12 Referring Provider (PT): Edmonia Lynch, MD   Encounter Date: 10/24/2021   PT End of Session - 10/24/21 1126     Visit Number 19    Number of Visits 33    Date for PT Re-Evaluation 12/05/21    Authorization Type MCR    PT Start Time 1115    Equipment Utilized During Treatment Other (comment)    Activity Tolerance Patient tolerated treatment well    Behavior During Therapy [facility] for tasks assessed/performed             Past Medical History:  Diagnosis Date   Atrial tachycardia ([place])    a. noted at cardiac rehab 5/13; event monitor ordered to assess for AFib   CAD (coronary artery disease)    a. NSTEMI 03/2012 ([place] 03/27/12: pLAD 30%, mLAD 99%, mCFX 95%, mRCA 99% with thrombus, EF 65%);  s/p PTCA/DESx1 to prox-mid RCA 03/27/12 urgently in setting of hypotension/bradycardia, with staged PTCA/Evolve study stent to mid LAD & PTCA/Evolve study stent to prox LCx 03/29/12 ;   echo 03/28/12:EF 60%, Aortic sclerosis without AS, mild RVE, mild reduced RVSF     Endometrial polyp    HTN (hypertension)    Hyperlipidemia    MI, old    Sleep apnea     Past Surgical History:  Procedure Laterality Date   APPENDECTOMY  80 years old   CAROTID STENT  04/2012   x3   CESAREAN SECTION  1984   CHOLECYSTECTOMY  1978   INTRAMEDULLARY (IM) NAIL INTERTROCHANTERIC Left 06/20/2021   Procedure: INTRAMEDULLARY (IM) NAIL INTERTROCHANTRIC;  Surgeon: Renette Butters, MD;  Location: [place];  Service: Orthopedics;  Laterality: Left;   LEFT HEART CATHETERIZATION WITH CORONARY ANGIOGRAM N/A 03/27/2012   Procedure: LEFT HEART CATHETERIZATION WITH CORONARY ANGIOGRAM;  Surgeon: Burnell Blanks, MD;  Location: [facility] CATH LAB;  Service: Cardiovascular;  Laterality:  N/A;   PERCUTANEOUS CORONARY STENT INTERVENTION (PCI-S) N/A 03/27/2012   Procedure: PERCUTANEOUS CORONARY STENT INTERVENTION (PCI-S);  Surgeon: Burnell Blanks, MD;  Location: [facility] CATH LAB;  Service: Cardiovascular;  Laterality: N/A;   PERCUTANEOUS CORONARY STENT INTERVENTION (PCI-S) N/A 03/29/2012   Procedure: PERCUTANEOUS CORONARY STENT INTERVENTION (PCI-S);  Surgeon: [name], MD;  Location: [facility] CATH LAB;  Service: Cardiovascular;  Laterality: N/A;   TONSILLECTOMY  80 years old    There were no vitals filed for this visit.   Subjective Assessment - 10/24/21 1245     Subjective Pt states her left outside hip has been hurting    Currently in Pain? Yes    Pain Score 2     Pain Location Hip    Pain Orientation Left    Pain Descriptors / Indicators Aching    Pain Type Surgical pain    Pain Frequency Intermittent    Aggravating Factors  side lying and walking with cane.                                          PT Short Term Goals - 10/14/21 1203       PT SHORT TERM GOAL #3   Baseline Pt climbing stairs using handrail and cane in home and at relatives home  indep    Status Achieved               PT Long Term Goals - 09/16/21 1257       PT LONG TERM GOAL #3   Baseline 9/27: pt has made breakfast stanidng    Status On-going                Pt seen for aquatic therapy today.  Treatment took place in water 3.25-4.8 ft in depth at the [employer] pool. Temp of water was 91.  Pt entered/exited the pool via stairs step to pattern supervisionwith bilat rail.     Warm up forward, backward and side stepping/walking cues for increased step length, increased speed, hand placement to increase resistance. 4 widths each. 4 widths with hand buoys submerged for core engagement.   Stretching into hip extension using noodle Manual left hip glut stretch in sitting    Standing With ankle buoys and supported bilat ue blue hand  buoys Add/abd 2x10 Hip flex 2x10 Hip Ext 2 x 10 Full Hip flex through hip extension x 5 R/L.  Cues for increased effort. Pt improving with balance as able to use Ue buoy support with exercises rather than holding to pool wall    Step ups on bottom step initiated hoilding to handrails bilat then decreased to unilat -forward x10 R/L -sidestepping left only x 10. VC and demons for weight shift to properly execute.    Forward and backward lunges x 6 widths 2 foam hand buoys submerged for core engagement.   Pt requires buoyancy for support and to offload joints with strengthening exercises. Viscosity of the water is needed for resistance of strengthening; water current perturbations provides challenge to standing balance unsupported, requiring increased core activation       Plan - 10/24/21 1127     Clinical Impression Statement Pt presents using cane from parkinglot.  Improved goin pain 0/10.  Left lateral hip pain. Able to recreate with palpation. Decreased discimfort with deep pressure and piriformis stretch in sitting.  Used noodle for left hip extension for greater stretch.  Pt with some discomfort but Range improving. Next visit on land for re-assess. Pt will beneift from continued aquatics but may want to balance with on land treatments as well to address increased indep with functional mobility.    Stability/Clinical Decision Making Stable/Uncomplicated    Clinical Decision Making Low    Rehab Potential Good    PT Frequency 2x / week    PT Treatment/Interventions ADLs/Self Care Home Management;Aquatic Therapy;Cryotherapy;Electrical Stimulation;Gait training;Ultrasound;Traction;Moist Heat;Stair training;Functional mobility training;Therapeutic activities;Therapeutic exercise;Balance training;Neuromuscular re-education;Manual techniques;Patient/family education;Passive range of motion;Dry needling;Taping    PT Next Visit Plan progress note on land visit/reassessment.    PT Home  Exercise Plan [id number]             Patient will benefit from skilled therapeutic intervention in order to improve the following deficits and impairments:  Abnormal gait, Decreased range of motion, Difficulty walking, Decreased activity tolerance, Pain, Improper body mechanics, Impaired flexibility, Decreased balance, Decreased scar mobility, Decreased strength, Postural dysfunction  Visit Diagnosis: Abnormal posture  Other abnormalities of gait and mobility  Unsteadiness on feet  Chronic pain of right knee  Difficulty in walking, not elsewhere classified  Muscle weakness (generalized)     Problem List Patient Active Problem List   Diagnosis Date Noted   Pain due to onychomycosis of toenails of both feet 08/22/2021   Intertrochanteric fracture of left hip ([place]) 06/24/2021  Closed left hip fracture, initial encounter ([place]) 06/20/2021   Hypokalemia 06/20/2021   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   Cognitive complaints 06/02/2016   Hypoxemic respiratory failure, chronic ([place]) 09/19/2015   MCI (mild cognitive impairment) 03/06/2015   Depression with anxiety 03/06/2015   Bullae 07/02/2014   History of MRSA infection 11/20/2012   Low back pain 07/07/2012   Insomnia 04/28/2012   Coronary Artery Disease 04/19/2012   Hyperlipidemia 04/19/2012   Anxiety 04/19/2012   Non-ST elevation myocardial infarction (NSTEMI), initial care episode ([place]) 03/28/2012   HYPERTENSION, BENIGN 06/12/2010   Dyspnea 06/12/2010   Prolonged QT interval 06/12/2010    Annamarie Major) Ariatna Jester MPT 10/24/2021, 12:53 PM  [facility] [address] [place], [place], [address] Phone: [phone]   Fax:  [phone]  Name: KLAUDIA BEIRNE MRN: [id number] Date of Birth: 02-01-1941

## 2021-10-27 ENCOUNTER — Telehealth: Payer: Self-pay | Admitting: Pulmonary Disease

## 2021-10-27 ENCOUNTER — Other Ambulatory Visit: Payer: Self-pay | Admitting: Internal Medicine

## 2021-10-27 NOTE — Telephone Encounter (Signed)
ONO/RA continues to show sat <88% for 1h 45 mins during sleep Continue on O2 - will discuss more on OV

## 2021-10-28 ENCOUNTER — Ambulatory Visit (HOSPITAL_BASED_OUTPATIENT_CLINIC_OR_DEPARTMENT_OTHER): Payer: Medicare Other | Admitting: Physical Therapy

## 2021-10-29 NOTE — Telephone Encounter (Signed)
Spoke with pt and notified of results per Dr. Alva. Pt verbalized understanding and denied any questions. 

## 2021-11-03 ENCOUNTER — Telehealth: Payer: Self-pay

## 2021-11-03 NOTE — Telephone Encounter (Signed)
Patient will send in the cologuard in the next few weeks. She states that she has had a lot going on but with PT she is making progress.

## 2021-11-04 ENCOUNTER — Encounter (HOSPITAL_BASED_OUTPATIENT_CLINIC_OR_DEPARTMENT_OTHER): Payer: Self-pay | Admitting: Physical Therapy

## 2021-11-04 ENCOUNTER — Ambulatory Visit (HOSPITAL_BASED_OUTPATIENT_CLINIC_OR_DEPARTMENT_OTHER): Payer: Medicare Other | Admitting: Physical Therapy

## 2021-11-04 ENCOUNTER — Other Ambulatory Visit: Payer: Self-pay

## 2021-11-04 DIAGNOSIS — R2689 Other abnormalities of gait and mobility: Secondary | ICD-10-CM | POA: Diagnosis not present

## 2021-11-04 DIAGNOSIS — R2681 Unsteadiness on feet: Secondary | ICD-10-CM | POA: Diagnosis not present

## 2021-11-04 DIAGNOSIS — R293 Abnormal posture: Secondary | ICD-10-CM

## 2021-11-04 DIAGNOSIS — R262 Difficulty in walking, not elsewhere classified: Secondary | ICD-10-CM | POA: Diagnosis not present

## 2021-11-04 DIAGNOSIS — G8929 Other chronic pain: Secondary | ICD-10-CM | POA: Diagnosis not present

## 2021-11-04 DIAGNOSIS — M25561 Pain in right knee: Secondary | ICD-10-CM | POA: Diagnosis not present

## 2021-11-04 NOTE — Therapy (Signed)
[facility] [address] [place], [place], [address] Phone: [phone]   Fax:  [phone]  Physical Therapy Treatment Progress Note Reporting Period 08/15/21 to 11/04/2021   See note below for Objective Data and Assessment of Progress/Goals.      Patient Details  Name: Carrie Chung MRN: [id number] Date of Birth: [birth year] Referring Provider (PT): Edmonia Lynch, MD   Encounter Date: 11/04/2021   PT End of Session - 11/04/21 1619     Visit Number 20    Number of Visits 36    Date for PT Re-Evaluation 01/01/22    Authorization Type MCR    Progress Note Due on Visit 7    PT Start Time 1615    PT Stop Time 1700    PT Time Calculation (min) 45 min    Activity Tolerance Patient tolerated treatment well    Behavior During Therapy [facility] for tasks assessed/performed             Past Medical History:  Diagnosis Date   Atrial tachycardia ([place])    a. noted at cardiac rehab 5/13; event monitor ordered to assess for AFib   CAD (coronary artery disease)    a. NSTEMI 03/2012 ([place] 03/27/12: pLAD 30%, mLAD 99%, mCFX 95%, mRCA 99% with thrombus, EF 65%);  s/p PTCA/DESx1 to prox-mid RCA 03/27/12 urgently in setting of hypotension/bradycardia, with staged PTCA/Evolve study stent to mid LAD & PTCA/Evolve study stent to prox LCx 03/29/12 ;   echo 03/28/12:EF 60%, Aortic sclerosis without AS, mild RVE, mild reduced RVSF     Endometrial polyp    HTN (hypertension)    Hyperlipidemia    MI, old    Sleep apnea     Past Surgical History:  Procedure Laterality Date   APPENDECTOMY  80 years old   CAROTID STENT  04/2012   x3   CESAREAN SECTION  1984   CHOLECYSTECTOMY  1978   INTRAMEDULLARY (IM) NAIL INTERTROCHANTERIC Left 06/20/2021   Procedure: INTRAMEDULLARY (IM) NAIL INTERTROCHANTRIC;  Surgeon: Renette Butters, MD;  Location: [place];  Service: Orthopedics;  Laterality: Left;   LEFT HEART CATHETERIZATION WITH CORONARY ANGIOGRAM N/A  03/27/2012   Procedure: LEFT HEART CATHETERIZATION WITH CORONARY ANGIOGRAM;  Surgeon: Burnell Blanks, MD;  Location: [facility] CATH LAB;  Service: Cardiovascular;  Laterality: N/A;   PERCUTANEOUS CORONARY STENT INTERVENTION (PCI-S) N/A 03/27/2012   Procedure: PERCUTANEOUS CORONARY STENT INTERVENTION (PCI-S);  Surgeon: Burnell Blanks, MD;  Location: [facility] CATH LAB;  Service: Cardiovascular;  Laterality: N/A;   PERCUTANEOUS CORONARY STENT INTERVENTION (PCI-S) N/A 03/29/2012   Procedure: PERCUTANEOUS CORONARY STENT INTERVENTION (PCI-S);  Surgeon: [name], MD;  Location: [facility] CATH LAB;  Service: Cardiovascular;  Laterality: N/A;   TONSILLECTOMY  80 years old    There were no vitals filed for this visit.   Subjective Assessment - 11/04/21 1619     Subjective Probably did not do the exercises as much as I should have but I havebeen up and walking around a good bit. My biggest problem is being in the bed- still have to lay on my back. I have no problem with the motion of moving from side to side but cannot lay on my side- mostly due to top of my hip- pointing to TFL region and along ITB.    Patient Stated Goals play with grand children, use the nu step the way I used to (as a cardio challenge), stand in kitchen comfortably to cook, navigate stairs- HR on one  side, sleep on Lt side.    Currently in Pain? Yes    Pain Score 6     Pain Location Hip    Pain Orientation Left    Pain Descriptors / Indicators Aching;Stabbing    Aggravating Factors  layin on Lt side, certain situations when I go to stand up, standing too long (about 10 min)    Pain Relieving Factors rest, aquatics                [facility] PT Assessment - 11/04/21 0001       Assessment   Medical Diagnosis s/p IM Nail Lt femur, closed reduction and manipultion of Lt hip fx    Referring Provider (PT) Edmonia Lynch, MD    Onset Date/Surgical Date 06/20/21    Prior Therapy inpatient and home health      Precautions   Precautions  [place]   Additional Comments stairs with hand rail- on Rt ascending      Prior Function   Vocation Requirements play with grand children, cooking      Posture/Postural Control   Posture Comments Lt leg shorter in supine with pelvis alingment in neutral, tends to stand on Rt leg and bend Lt to take weight off      Ambulation/Gait   Gait Comments Lt hip hike with limited knee bent, Lt hip trendelenburg                           [facility] Adult PT Treatment/Exercise - 11/04/21 0001       Knee/Hip Exercises: Standing   Gait Training SPC in Rt hand with heel lift, encouraging glut engagement at heel strike and in stance phase on Lt                     PT Education - 11/04/21 2035     Education Details anatomy of condition, POC, HEP, exercise form/rationale    Person(s) Educated Patient    Methods Explanation;Demonstration;Tactile cues;Verbal cues    Comprehension Verbalized understanding;Returned demonstration;Verbal cues required;Tactile cues required;Need further instruction              PT Short Term Goals - 11/04/21 1630       PT SHORT TERM GOAL #1   Title pt will demo hip flexion, abd and extension strength at least 4/5    Baseline see flowsheet      PT SHORT TERM GOAL #2   Title pt will be safe in household ambulation with SPC    Baseline most of the time I use the cane in the house, I just use the walker to carry things    Status Achieved      PT SHORT TERM GOAL #3   Title pt will be able to navigate stairs with use of hand rail and SPC to go upstairs in her home    Status Achieved               PT Long Term Goals - 11/04/21 1631       PT LONG TERM GOAL #1   Title Pt will be able to play with her grandchildren without limitations    Baseline still very limited    Status On-going    [place] Date 01/01/22      PT LONG TERM GOAL #2   Title pt will be able to ambulate for at least 1 hour without AD for  functional ambulation    Baseline still using AD consistently,walking about 500 feet from car to pool and I need to sit because of pain and fatigue    Status On-going      PT LONG TERM GOAL #3   Title pt will be able to stand and cook large meal without rest break    Baseline 9/27: pt has made breakfast stanidng, 11/15 I made a soup today but had help    Status On-going      PT LONG TERM GOAL #4   Title pt will d/c to proper long term program including nustep for cardiac challenges    Status On-going                   Plan - 11/04/21 1643     Clinical Impression Statement Added 3-layer lift to Lt shoe which pt reported made walking feel easier. She demo good quad & glut firing with fatigue in CKC. At this point we will plan to continue with PT but alter POC to be in the pool 1/week and on land 1/week to further challenge functional strength, balance and confidence in her ability to move around. Notable weakness in Lt hip abductor group indicated by trendelenburg gait- improved when she focused on glut firing but required CGA for safety when using SPC. I have asked her to stand for 5 min every 45 min and walk more with her cane in  a safe environment. She does have a gait belt at home and I suggested her daughter goes for a walk with her for safety. She is progressing very well thus far. will evaluate outcome of heel lift at next visit.    PT Frequency 2x / week    PT Duration 8 weeks    PT Treatment/Interventions ADLs/Self Care Home Management;Aquatic Therapy;Cryotherapy;Electrical Stimulation;Gait training;Ultrasound;Traction;Moist Heat;Stair training;Functional mobility training;Therapeutic activities;Therapeutic exercise;Balance training;Neuromuscular re-education;Manual techniques;Patient/family education;Passive range of motion;Dry needling;Taping    PT Next Visit Plan land- gait training, CKC strength and endurance;  pool- hip abductor strength, balance    PT Home Exercise Plan  [id number]    Consulted and Agree with Plan of Care Patient             Patient will benefit from skilled therapeutic intervention in order to improve the following deficits and impairments:  Abnormal gait, Decreased range of motion, Difficulty walking, Decreased activity tolerance, Pain, Improper body mechanics, Impaired flexibility, Decreased balance, Decreased scar mobility, Decreased strength, Postural dysfunction  Visit Diagnosis: Abnormal posture - Plan: PT plan of care cert/re-cert  Other abnormalities of gait and mobility - Plan: PT plan of care cert/re-cert  Unsteadiness on feet - Plan: PT plan of care cert/re-cert     Problem List Patient Active Problem List   Diagnosis Date Noted   Pain due to onychomycosis of toenails of both feet 08/22/2021   Intertrochanteric fracture of left hip ([place]) 06/24/2021   Closed left hip fracture, initial encounter ([place]) 06/20/2021   Hypokalemia 06/20/2021   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   Cognitive complaints 06/02/2016   Hypoxemic respiratory failure, chronic ([place]) 09/19/2015   MCI (mild cognitive impairment) 03/06/2015   Depression with anxiety 03/06/2015   [place] 07/02/2014   History of MRSA infection 11/20/2012   Low back pain 07/07/2012   Insomnia 04/28/2012   Coronary Artery Disease 04/19/2012   Hyperlipidemia 04/19/2012   Anxiety 04/19/2012   Non-ST elevation myocardial infarction (NSTEMI), initial care episode ([place]) 03/28/2012   HYPERTENSION, BENIGN 06/12/2010   Dyspnea 06/12/2010  Prolonged QT interval 06/12/2010   Anea Fodera C. Jawann Urbani PT, DPT 11/04/21 8:42 PM   [facility] [address] [place], [place], [address] Phone: [phone]   Fax:  [phone]  Name: Carrie Chung MRN: [id number] Date of Birth: 1941/10/26

## 2021-11-05 ENCOUNTER — Encounter: Payer: Self-pay | Admitting: Cardiovascular Disease

## 2021-11-05 ENCOUNTER — Ambulatory Visit (INDEPENDENT_AMBULATORY_CARE_PROVIDER_SITE_OTHER): Payer: Medicare Other | Admitting: Cardiovascular Disease

## 2021-11-05 VITALS — BP 120/70 | HR 78 | Ht 60.5 in | Wt 141.0 lb

## 2021-11-05 DIAGNOSIS — I10 Essential (primary) hypertension: Secondary | ICD-10-CM

## 2021-11-05 DIAGNOSIS — E782 Mixed hyperlipidemia: Secondary | ICD-10-CM | POA: Diagnosis not present

## 2021-11-05 DIAGNOSIS — I25119 Atherosclerotic heart disease of native coronary artery with unspecified angina pectoris: Secondary | ICD-10-CM

## 2021-11-05 NOTE — Progress Notes (Signed)
Cardiology Office Note:    Date:  11/05/2021   ID:  Carrie, Chung Jan 23, 1941, MRN [id number]  PCP:  [name], MD   [name] Cardiologist:  [name], MD     Referring MD: [name], MD   Chief Complaint  Patient presents with   Coronary Artery Disease    History of Present Illness:    Carrie Chung is a 80 y.o. female with a hx of  coronary artery disease, presenting for follow-up evaluation. The patient initially presented with non-ST elevation infarct in 2013.  She was found to have multivessel coronary artery disease and underwent multivessel stenting at that time.  She has been managed medically since then with no recurrent ischemic events.  Her last stress test was a [employer] and demonstrated no ischemia.She's had 2 falls in the past year, initially sustaining a compression fracture with the first fall and then having a left femur fracture in July 2022. Both falls were mechanical in nature.   The patient is here alone today. She's been through a lot with recovery from her femur fracture. She has lost 25 pounds. She had a few episodes of angina during inpatient rehab but hasn't had any problems since that time.  She denies any recent chest pain or pressure, shortness of breath, orthopnea, PND, or lightheadedness.  Overall she is feeling well today.    Past Medical History:  Diagnosis Date   Atrial tachycardia ([place])    a. noted at cardiac rehab 5/13; event monitor ordered to assess for AFib   CAD (coronary artery disease)    a. NSTEMI 03/2012 ([place] 03/27/12: pLAD 30%, mLAD 99%, mCFX 95%, mRCA 99% with thrombus, EF 65%);  s/p PTCA/DESx1 to prox-mid RCA 03/27/12 urgently in setting of hypotension/bradycardia, with staged PTCA/Evolve study stent to mid LAD & PTCA/Evolve study stent to prox LCx 03/29/12 ;   echo 03/28/12:EF 60%, Aortic sclerosis without AS, mild RVE, mild reduced RVSF     Endometrial polyp    HTN (hypertension)     Hyperlipidemia    MI, old    Sleep apnea     Past Surgical History:  Procedure Laterality Date   APPENDECTOMY  80 years old   CAROTID STENT  04/2012   x3   CESAREAN SECTION  1984   CHOLECYSTECTOMY  1978   INTRAMEDULLARY (IM) NAIL INTERTROCHANTERIC Left 06/20/2021   Procedure: INTRAMEDULLARY (IM) NAIL INTERTROCHANTRIC;  Surgeon: Renette Butters, MD;  Location: [place];  Service: Orthopedics;  Laterality: Left;   LEFT HEART CATHETERIZATION WITH CORONARY ANGIOGRAM N/A 03/27/2012   Procedure: LEFT HEART CATHETERIZATION WITH CORONARY ANGIOGRAM;  Surgeon: Burnell Blanks, MD;  Location: [facility] CATH LAB;  Service: Cardiovascular;  Laterality: N/A;   PERCUTANEOUS CORONARY STENT INTERVENTION (PCI-S) N/A 03/27/2012   Procedure: PERCUTANEOUS CORONARY STENT INTERVENTION (PCI-S);  Surgeon: Burnell Blanks, MD;  Location: [facility] CATH LAB;  Service: Cardiovascular;  Laterality: N/A;   PERCUTANEOUS CORONARY STENT INTERVENTION (PCI-S) N/A 03/29/2012   Procedure: PERCUTANEOUS CORONARY STENT INTERVENTION (PCI-S);  Surgeon: [name], MD;  Location: [facility] CATH LAB;  Service: Cardiovascular;  Laterality: N/A;   TONSILLECTOMY  80 years old    Current Medications: Current Meds  Medication Sig   acetaminophen (TYLENOL) 325 MG tablet Take 1-2 tablets (325-650 mg total) by mouth every 4 (four) hours as needed for mild pain.   ALPRAZolam (XANAX) 1 MG tablet TAKE 1 TABLET(1 MG) BY MOUTH AT BEDTIME AS NEEDED FOR SLEEP   amLODipine (  NORVASC) 5 MG tablet TAKE 1 TABLET BY MOUTH EVERY DAY   aspirin 81 MG tablet Take 1 tablet (81 mg total) by mouth daily.   Calcium Carbonate-Vitamin D 600-400 MG-UNIT tablet Take 1 tablet by mouth every evening.   clopidogrel (PLAVIX) 75 MG tablet One tab daily   Desvenlafaxine Succinate (PRISTIQ PO) Take by mouth daily.   esomeprazole (NEXIUM) 40 MG capsule Take 1 capsule (40 mg total) by mouth daily at 12 noon.   folic acid-pyridoxine-cyancobalamin (FOLBIC) 2.5-25-2 MG TABS tablet  Take 1 tablet by mouth daily.   HYDROcodone-acetaminophen (NORCO/VICODIN) 5-325 MG tablet One tab by mouth sparingly every 8 hours as needed for hip pain   metoprolol tartrate (LOPRESSOR) 50 MG tablet Take 1.5 tablets (75 mg total) by mouth 2 (two) times daily.   nitroGLYCERIN (NITROSTAT) 0.4 MG SL tablet Place 1 tablet (0.4 mg total) under the tongue every 5 (five) minutes as needed for chest pain.   OXYGEN Inhale into the lungs. Per patient using 2 liters at night.   rosuvastatin (CRESTOR) 20 MG tablet TAKE 1 TABLET(20 MG) BY MOUTH DAILY   venlafaxine XR (EFFEXOR-XR) 75 MG 24 hr capsule Take 1 capsule (75 mg total) by mouth daily with breakfast.     Allergies:   Penicillins, Sulfonamide derivatives, Lactose intolerance (gi), and Sulfamethoxazole   Social History   Socioeconomic History   Marital status: Married    Spouse name: Not on file   Number of children: 1   Years of education: Coll +   Highest education level: Not on file  Occupational History   Occupation: Retired Marine scientist  Tobacco Use   Smoking status: Former    Types: Cigarettes    Quit date: 04/19/1980    Years since quitting: 41.5   Smokeless tobacco: Never  Substance and Sexual Activity   Alcohol use: Yes    Alcohol/week: 20.0 standard drinks    Types: 20 Glasses of wine per week    Comment: socially   Drug use: No   Sexual activity: Never  Other Topics Concern   Not on file  Social History Narrative   Caffeine 2 cups tea.   Social Determinants of Health   Financial Resource Strain: Low Risk    Difficulty of Paying Living Expenses: Not hard at all  Food Insecurity: No Food Insecurity   Worried About Charity fundraiser in the Last Year: Never true   [place] in the Last Year: Never true  Transportation Needs: No Transportation Needs   Lack of Transportation (Medical): No   Lack of Transportation (Non-Medical): No  Physical Activity: Inactive   Days of Exercise per Week: 0 days   Minutes of Exercise  per Session: 0 min  Stress: No Stress Concern Present   Feeling of Stress : Only a little  Social Connections: Engineer, building services of Communication with Friends and Family: Twice a week   Frequency of Social Gatherings with Friends and Family: Twice a week   Attends Religious Services: 1 to 4 times per year   Active Member of [employer] or Organizations: Yes   Attends Archivist Meetings: 1 to 4 times per year   Marital Status: Married     Family History: The patient's family history includes Cancer - Other in her father; Colon cancer in her father; Coronary artery disease in her mother.  ROS:   Please see the history of present illness.    All other systems reviewed and are negative.  EKGs/Labs/Other Studies Reviewed:    The following studies were reviewed today: Myocardial Perfusion Scan 11/22/2020: The left ventricular ejection fraction is hyperdynamic (>65%). Nuclear stress EF: 75%. There was no ST segment deviation noted during stress. The study is normal. This is a low risk study.   Low risk stress nuclear study with normal perfusion and normal left ventricular regional and global systolic function.  EKG:  EKG is not ordered today.    Recent Labs: 06/20/2021: Magnesium 2.3 10/06/2021: ALT 15; BUN 10; Creat 0.60; Hemoglobin 13.5; Platelets 202; Potassium 3.9; Sodium 142; TSH 2.10  Recent Lipid Panel    Component Value Date/Time   CHOL 173 02/18/2021 0916   TRIG 152 (H) 02/18/2021 0916   HDL 73 02/18/2021 0916   CHOLHDL 2.4 02/18/2021 0916   VLDL 41 (H) 03/25/2017 1102   LDLCALC 75 02/18/2021 0916   LDLDIRECT 152.4 03/09/2012 0900     Risk Assessment/Calculations:           Physical Exam:    VS:  BP 120/70   Pulse 78   Ht 5' 0.5" (1.537 m)   Wt 141 lb (64 kg)   SpO2 96%   BMI 27.08 kg/m     Wt Readings from Last 3 Encounters:  11/05/21 141 lb (64 kg)  10/06/21 139 lb (63 kg)  06/24/21 161 lb 6 oz (73.2 kg)     GEN:  Well  nourished, well developed in no acute distress HEENT: Normal NECK: No JVD; No carotid bruits LYMPHATICS: No lymphadenopathy CARDIAC: RRR, no murmurs, rubs, gallops RESPIRATORY:  Clear to auscultation without rales, wheezing or rhonchi  ABDOMEN: Soft, non-tender, non-distended MUSCULOSKELETAL:  No edema; No deformity  SKIN: Warm and dry NEUROLOGIC:  Alert and oriented x 3 PSYCHIATRIC:  Normal affect   ASSESSMENT:    1. Coronary artery disease involving native coronary artery of native heart with angina pectoris ([place])   2. Mixed hyperlipidemia   3. Essential hypertension    PLAN:    In order of problems listed above:  The patient is clinically stable on her current medical program which includes aspirin, clopidogrel, metoprolol, amlodipine, and high intensity statin drug.  Her last nuclear stress test within the past year demonstrated no significant ischemia and normal LVEF. Last lipids showed a cholesterol 173, HDL 73, LDL 75, ALT 15.  Continue rosuvastatin 20 mg daily. Blood pressure is well controlled.  The patient has lost 20 pounds and I am sure this is helping her blood pressure control.  She remains on amlodipine and metoprolol.  She seems to be tolerating her medications well.       Medication Adjustments/Labs and Tests Ordered: Current medicines are reviewed at length with the patient today.  Concerns regarding medicines are outlined above.  No orders of the defined types were placed in this encounter.  No orders of the defined types were placed in this encounter.   Patient Instructions  Medication Instructions:  *If you need a refill on your cardiac medications before your next appointment, please call your pharmacy*  Lab Work: If you have labs (blood work) drawn today and your tests are completely normal, you will receive your results only by: [place] (if you have MyChart) OR A paper copy in the mail If you have any lab test that is abnormal or we need to  change your treatment, we will call you to review the results.  Testing/Procedures: None ordered today.  Follow-Up: At [facility], you and your health needs are our  priority.  As part of our continuing mission to provide you with exceptional heart care, we have created designated Provider Care Teams.  These Care Teams include your primary Cardiologist (physician) and Advanced Practice Providers (APPs -  Physician Assistants and Nurse Practitioners) who all work together to provide you with the care you need, when you need it.  We recommend signing up for the patient portal called "MyChart".  Sign up information is provided on this After Visit Summary.  MyChart is used to connect with patients for Virtual Visits (Telemedicine).  Patients are able to view lab/test results, encounter notes, upcoming appointments, etc.  Non-urgent messages can be sent to your provider as well.   To learn more about what you can do with MyChart, go to [email or web address].    Your next appointment:   6 month(s)  The format for your next appointment:   In Person  Provider:   [name], MD      Signed, [name], MD  11/05/2021 5:44 PM    [place]

## 2021-11-05 NOTE — Patient Instructions (Signed)
Medication Instructions:  *If you need a refill on your cardiac medications before your next appointment, please call your pharmacy*  Lab Work: If you have labs (blood work) drawn today and your tests are completely normal, you will receive your results only by: [place] (if you have MyChart) OR A paper copy in the mail If you have any lab test that is abnormal or we need to change your treatment, we will call you to review the results.  Testing/Procedures: None ordered today.  Follow-Up: At [facility], you and your health needs are our priority.  As part of our continuing mission to provide you with exceptional heart care, we have created designated Provider Care Teams.  These Care Teams include your primary Cardiologist (physician) and Advanced Practice Providers (APPs -  Physician Assistants and Nurse Practitioners) who all work together to provide you with the care you need, when you need it.  We recommend signing up for the patient portal called "MyChart".  Sign up information is provided on this After Visit Summary.  MyChart is used to connect with patients for Virtual Visits (Telemedicine).  Patients are able to view lab/test results, encounter notes, upcoming appointments, etc.  Non-urgent messages can be sent to your provider as well.   To learn more about what you can do with MyChart, go to [email or web address].    Your next appointment:   6 month(s)  The format for your next appointment:   In Person  Provider:   [name], MD

## 2021-11-11 ENCOUNTER — Other Ambulatory Visit: Payer: Self-pay

## 2021-11-11 ENCOUNTER — Encounter (HOSPITAL_BASED_OUTPATIENT_CLINIC_OR_DEPARTMENT_OTHER): Payer: Self-pay | Admitting: Physical Therapy

## 2021-11-11 ENCOUNTER — Ambulatory Visit (HOSPITAL_BASED_OUTPATIENT_CLINIC_OR_DEPARTMENT_OTHER): Payer: Medicare Other | Admitting: Physical Therapy

## 2021-11-11 DIAGNOSIS — G8929 Other chronic pain: Secondary | ICD-10-CM | POA: Diagnosis not present

## 2021-11-11 DIAGNOSIS — M25561 Pain in right knee: Secondary | ICD-10-CM | POA: Diagnosis not present

## 2021-11-11 DIAGNOSIS — R293 Abnormal posture: Secondary | ICD-10-CM

## 2021-11-11 DIAGNOSIS — R262 Difficulty in walking, not elsewhere classified: Secondary | ICD-10-CM | POA: Diagnosis not present

## 2021-11-11 DIAGNOSIS — R2689 Other abnormalities of gait and mobility: Secondary | ICD-10-CM

## 2021-11-11 DIAGNOSIS — R2681 Unsteadiness on feet: Secondary | ICD-10-CM

## 2021-11-11 NOTE — Therapy (Signed)
[place] [address] [place], [place], [address] Phone: [phone]   Fax:  [phone]  Physical Therapy Treatment  Patient Details  Name: Carrie Chung MRN: [id number] Date of Birth: 08-08-1941 Referring Provider (PT): Edmonia Lynch, MD   Encounter Date: 11/11/2021   PT End of Session - 11/11/21 1549     Visit Number 21    Number of Visits 2    Date for PT Re-Evaluation 01/01/22    Authorization Type MCR    Progress Note Due on Visit 22    PT Start Time 1355    PT Stop Time 1437    PT Time Calculation (min) 42 min    Activity Tolerance Patient tolerated treatment well    Behavior During Therapy [facility] for tasks assessed/performed             Past Medical History:  Diagnosis Date   Atrial tachycardia ([place])    a. noted at cardiac rehab 5/13; event monitor ordered to assess for AFib   CAD (coronary artery disease)    a. NSTEMI 03/2012 ([place] 03/27/12: pLAD 30%, mLAD 99%, mCFX 95%, mRCA 99% with thrombus, EF 65%);  s/p PTCA/DESx1 to prox-mid RCA 03/27/12 urgently in setting of hypotension/bradycardia, with staged PTCA/Evolve study stent to mid LAD & PTCA/Evolve study stent to prox LCx 03/29/12 ;   echo 03/28/12:EF 60%, Aortic sclerosis without AS, mild RVE, mild reduced RVSF     Endometrial polyp    HTN (hypertension)    Hyperlipidemia    MI, old    Sleep apnea     Past Surgical History:  Procedure Laterality Date   APPENDECTOMY  80 years old   CAROTID STENT  04/2012   x3   CESAREAN SECTION  1984   CHOLECYSTECTOMY  1978   INTRAMEDULLARY (IM) NAIL INTERTROCHANTERIC Left 06/20/2021   Procedure: INTRAMEDULLARY (IM) NAIL INTERTROCHANTRIC;  Surgeon: Renette Butters, MD;  Location: [place];  Service: Orthopedics;  Laterality: Left;   LEFT HEART CATHETERIZATION WITH CORONARY ANGIOGRAM N/A 03/27/2012   Procedure: LEFT HEART CATHETERIZATION WITH CORONARY ANGIOGRAM;  Surgeon: Burnell Blanks, MD;  Location: [facility] CATH LAB;   Service: Cardiovascular;  Laterality: N/A;   PERCUTANEOUS CORONARY STENT INTERVENTION (PCI-S) N/A 03/27/2012   Procedure: PERCUTANEOUS CORONARY STENT INTERVENTION (PCI-S);  Surgeon: Burnell Blanks, MD;  Location: [facility] CATH LAB;  Service: Cardiovascular;  Laterality: N/A;   PERCUTANEOUS CORONARY STENT INTERVENTION (PCI-S) N/A 03/29/2012   Procedure: PERCUTANEOUS CORONARY STENT INTERVENTION (PCI-S);  Surgeon: [name], MD;  Location: [facility] CATH LAB;  Service: Cardiovascular;  Laterality: N/A;   TONSILLECTOMY  80 years old    There were no vitals filed for this visit.   Subjective Assessment - 11/11/21 1403     Subjective Pt reports walking is easier and her gait is better though had increased L hip, groin, and thigh pain after using the heel lifts.  Pt reports her pain has regressed 2 months.  Pt states she is much more mobile.  Pt has been walking some at home without [facility] but uses external objects for support.  Pt is unable to lift.  Pt is limited with activities with grandchildren.    Patient Stated Goals to be able to sit on a regular toilet.  play with grand children, use the nu step the way I used to (as a cardio challenge), stand in kitchen comfortably to cook, navigate stairs- HR on one side, sleep on Lt side.    Currently in Pain? Yes  Pain Score 6     Pain Location --   hip and thigh   Pain Orientation Left                               [facility] Adult PT Treatment/Exercise - 11/11/21 0001       Ambulation/Gait   Gait Comments Pt ambulated with SPC with cuing and instruction for gait sequencing with cane and heel to toe gait.  Instructed pt to look up with ambulation.  pt performed ambulation with SPC in front of mirror also to encourage proper form.      Exercises   Exercises Knee/Hip   Reviewed current function, response to prior Rx, pain level, and HEP compliance.     Knee/Hip Exercises: Standing   Other Standing Knee Exercises standing heel raises and  toe raises 2x10 reps with bilat UE asssit.  Pt attempted standing L hip flexion with knee bent, L hip abd, and L hip flexion SLR with bilat UE assist though had pain.    Other Standing Knee Exercises standing on airex with FA and FT 2x30 sec each. sit to stands from hi/lo table approx 10-15 reps with and without UE assistance.                     PT Education - 11/11/21 1548     Education Details Gait training, Transfer training, and exercise form.    Person(s) Educated Patient    Methods Explanation;Demonstration;Verbal cues    Comprehension Verbalized understanding;Returned demonstration;Verbal cues required              PT Short Term Goals - 11/04/21 1630       PT SHORT TERM GOAL #1   Title pt will demo hip flexion, abd and extension strength at least 4/5    Baseline see flowsheet      PT SHORT TERM GOAL #2   Title pt will be safe in household ambulation with SPC    Baseline most of the time I use the cane in the house, I just use the walker to carry things    Status Achieved      PT SHORT TERM GOAL #3   Title pt will be able to navigate stairs with use of hand rail and SPC to go upstairs in her home    Status Achieved               PT Long Term Goals - 11/04/21 1631       PT LONG TERM GOAL #1   Title Pt will be able to play with her grandchildren without limitations    Baseline still very limited    Status On-going    Target Date 01/01/22      PT LONG TERM GOAL #2   Title pt will be able to ambulate for at least 1 hour without AD for functional ambulation    Baseline still using AD consistently,walking about 500 feet from car to pool and I need to sit because of pain and fatigue    Status On-going      PT LONG TERM GOAL #3   Title pt will be able to stand and cook large meal without rest break    Baseline 9/27: pt has made breakfast stanidng, 11/15 I made a soup today but had help    Status On-going      PT LONG TERM GOAL #4   Title  pt will  d/c to proper long term program including nustep for cardiac challenges    Status On-going                   Plan - 11/11/21 1550     Clinical Impression Statement Pt reports having increased hip pain with using heel lift in shoe though states her walking is much better.  Pt presented to Rx today with SPC.  Pt ambulated with good stability with SPC having no LOB.  Pt demonstrated good heel strike on L with gait and did require some cuing with cane sequencing.  Pt has weakness in L hip.  Attempted standing L LE/hip exercises with UE assist though stopped due to pt having pain.  Pt reports she felt worse after Rx though states "It is a good pain" and reports improved pain from 6/10 before Rx to 5.5/10 after Rx.  Pt should benefit from cont skilled PT services including land based and aquatic therapy.    PT Treatment/Interventions ADLs/Self Care Home Management;Aquatic Therapy;Cryotherapy;Electrical Stimulation;Gait training;Ultrasound;Traction;Moist Heat;Stair training;Functional mobility training;Therapeutic activities;Therapeutic exercise;Balance training;Neuromuscular re-education;Manual techniques;Patient/family education;Passive range of motion;Dry needling;Taping    PT Next Visit Plan land- gait training, CKC strength and endurance;  pool- hip abductor strength, balance.    PT Home Exercise Plan [id number]    Consulted and Agree with Plan of Care Patient             Patient will benefit from skilled therapeutic intervention in order to improve the following deficits and impairments:  Abnormal gait, Decreased range of motion, Difficulty walking, Decreased activity tolerance, Pain, Improper body mechanics, Impaired flexibility, Decreased balance, Decreased scar mobility, Decreased strength, Postural dysfunction  Visit Diagnosis: Abnormal posture  Other abnormalities of gait and mobility  Unsteadiness on feet     Problem List Patient Active Problem List   Diagnosis Date Noted    Pain due to onychomycosis of toenails of both feet 08/22/2021   Intertrochanteric fracture of left hip ([place]) 06/24/2021   Closed left hip fracture, initial encounter ([place]) 06/20/2021   Hypokalemia 06/20/2021   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   Cognitive complaints 06/02/2016   Hypoxemic respiratory failure, chronic ([place]) 09/19/2015   MCI (mild cognitive impairment) 03/06/2015   Depression with anxiety 03/06/2015   Bullae 07/02/2014   History of MRSA infection 11/20/2012   Low back pain 07/07/2012   Insomnia 04/28/2012   Coronary Artery Disease 04/19/2012   Hyperlipidemia 04/19/2012   Anxiety 04/19/2012   Non-ST elevation myocardial infarction (NSTEMI), initial care episode ([place]) 03/28/2012   HYPERTENSION, BENIGN 06/12/2010   Dyspnea 06/12/2010   Prolonged QT interval 06/12/2010    Selinda Michaels III PT, DPT 11/11/21 5:10 PM   [facility] [place], [place], [address] Phone: [phone]   Fax:  [phone]  Name: Carrie Chung MRN: [id number] Date of Birth: August 01, 1941

## 2021-11-15 ENCOUNTER — Encounter: Payer: Self-pay | Admitting: Internal Medicine

## 2021-11-15 NOTE — Patient Instructions (Signed)
Be careful driving.  Continue your therapy and rehab.  Given small quantity of hydrocodone APAP.  Depression to be treated with Pristiq instead of Effexor.  Follow-up here in January regarding iron deficiency.  Take iron supplement.

## 2021-11-18 ENCOUNTER — Ambulatory Visit (HOSPITAL_BASED_OUTPATIENT_CLINIC_OR_DEPARTMENT_OTHER): Payer: Medicare Other | Admitting: Physical Therapy

## 2021-11-18 ENCOUNTER — Encounter (HOSPITAL_BASED_OUTPATIENT_CLINIC_OR_DEPARTMENT_OTHER): Payer: Self-pay | Admitting: Physical Therapy

## 2021-11-18 ENCOUNTER — Other Ambulatory Visit: Payer: Self-pay

## 2021-11-18 DIAGNOSIS — R2689 Other abnormalities of gait and mobility: Secondary | ICD-10-CM | POA: Diagnosis not present

## 2021-11-18 DIAGNOSIS — M6281 Muscle weakness (generalized): Secondary | ICD-10-CM

## 2021-11-18 DIAGNOSIS — R293 Abnormal posture: Secondary | ICD-10-CM | POA: Diagnosis not present

## 2021-11-18 DIAGNOSIS — R262 Difficulty in walking, not elsewhere classified: Secondary | ICD-10-CM

## 2021-11-18 DIAGNOSIS — R2681 Unsteadiness on feet: Secondary | ICD-10-CM | POA: Diagnosis not present

## 2021-11-18 DIAGNOSIS — M25561 Pain in right knee: Secondary | ICD-10-CM | POA: Diagnosis not present

## 2021-11-18 DIAGNOSIS — G8929 Other chronic pain: Secondary | ICD-10-CM | POA: Diagnosis not present

## 2021-11-18 NOTE — Therapy (Signed)
[place], [place], [address] Phone: [phone]   Fax:  [phone]  Physical Therapy Treatment  Patient Details  Name: Carrie Chung MRN: [id number] Date of Birth: 28-Sep-1941 Referring Provider (PT): Edmonia Lynch, MD   Encounter Date: 11/18/2021   PT End of Session - 11/18/21 1300     Visit Number 22    Number of Visits 33    Date for PT Re-Evaluation 01/01/22    Authorization Type MCR    Progress Note Due on Visit 5    PT Start Time 1201    PT Stop Time 1245    PT Time Calculation (min) 44 min    Activity Tolerance Patient tolerated treatment well    Behavior During Therapy [facility] for tasks assessed/performed             Past Medical History:  Diagnosis Date   Atrial tachycardia ([place])    a. noted at cardiac rehab 5/13; event monitor ordered to assess for AFib   CAD (coronary artery disease)    a. NSTEMI 03/2012 ([place] 03/27/12: pLAD 30%, mLAD 99%, mCFX 95%, mRCA 99% with thrombus, EF 65%);  s/p PTCA/DESx1 to prox-mid RCA 03/27/12 urgently in setting of hypotension/bradycardia, with staged PTCA/Evolve study stent to mid LAD & PTCA/Evolve study stent to prox LCx 03/29/12 ;   echo 03/28/12:EF 60%, Aortic sclerosis without AS, mild RVE, mild reduced RVSF     Endometrial polyp    HTN (hypertension)    Hyperlipidemia    MI, old    Sleep apnea     Past Surgical History:  Procedure Laterality Date   APPENDECTOMY  80 years old   CAROTID STENT  04/2012   x3   CESAREAN SECTION  1984   CHOLECYSTECTOMY  1978   INTRAMEDULLARY (IM) NAIL INTERTROCHANTERIC Left 06/20/2021   Procedure: INTRAMEDULLARY (IM) NAIL INTERTROCHANTRIC;  Surgeon: Renette Butters, MD;  Location: [place];  Service: Orthopedics;  Laterality: Left;   LEFT HEART CATHETERIZATION WITH CORONARY ANGIOGRAM N/A 03/27/2012   Procedure: LEFT HEART CATHETERIZATION WITH CORONARY ANGIOGRAM;  Surgeon: Burnell Blanks, MD;  Location: [facility] CATH LAB;   Service: Cardiovascular;  Laterality: N/A;   PERCUTANEOUS CORONARY STENT INTERVENTION (PCI-S) N/A 03/27/2012   Procedure: PERCUTANEOUS CORONARY STENT INTERVENTION (PCI-S);  Surgeon: Burnell Blanks, MD;  Location: [facility] CATH LAB;  Service: Cardiovascular;  Laterality: N/A;   PERCUTANEOUS CORONARY STENT INTERVENTION (PCI-S) N/A 03/29/2012   Procedure: PERCUTANEOUS CORONARY STENT INTERVENTION (PCI-S);  Surgeon: [name], MD;  Location: [facility] CATH LAB;  Service: Cardiovascular;  Laterality: N/A;   TONSILLECTOMY  80 years old    There were no vitals filed for this visit.   Subjective Assessment - 11/18/21 1305     Subjective "Seems to have increase in right hip pain since the addition of the heel lift"                                          PT Short Term Goals - 11/04/21 1630       PT SHORT TERM GOAL #1   Title pt will demo hip flexion, abd and extension strength at least 4/5    Baseline see flowsheet      PT SHORT TERM GOAL #2   Title pt will be safe in household ambulation with SPC    Baseline most of the time I use  the cane in the house, I just use the walker to carry things    Status Achieved      PT SHORT TERM GOAL #3   Title pt will be able to navigate stairs with use of hand rail and SPC to go upstairs in her home    Status Achieved               PT Long Term Goals - 11/04/21 1631       PT LONG TERM GOAL #1   Title Pt will be able to play with her grandchildren without limitations    Baseline still very limited    Status On-going    Target Date 01/01/22      PT LONG TERM GOAL #2   Title pt will be able to ambulate for at least 1 hour without AD for functional ambulation    Baseline still using AD consistently,walking about 500 feet from car to pool and I need to sit because of pain and fatigue    Status On-going      PT LONG TERM GOAL #3   Title pt will be able to stand and cook large meal without rest break    Baseline  9/27: pt has made breakfast stanidng, 11/15 I made a soup today but had help    Status On-going      PT LONG TERM GOAL #4   Title pt will d/c to proper long term program including nustep for cardiac challenges    Status On-going            Pt seen for aquatic therapy today.  Treatment took place in water 3.25-4.8 ft in depth at the [employer] pool. Temp of water was 91.  Pt entered/exited the pool via stairs step to pattern supervisionwith bilat rail.     Warm up forward, backward and side stepping/walking cues for increased step length, increased speed, hand placement to increase resistance. 4 widths each. 4 widths with hand buoys submerged for core engagement.   Seated Manual Stretching of hamstring glute/piriformis. Massage and deep pressure to trigger points.    Standing Manual assisted stretching of right quad and hip flex Supported by hand buoys: Add/abd 2x10 . Cues for increased speed with abd to increase resistance Hip flex 2x10 Hip Ext 2 x 10 Full Hip flex through hip extension x 10 R/L.  supported by noodle. Cues for core stabilization Marching x20   Walking: lunges x 4 widths Side stepping x 4 widths   Pt requires buoyancy for support and to offload joints with strengthening exercises. Viscosity of the water is needed for resistance of strengthening; water current perturbations provides challenge to standing balance unsupported, requiring increased core activation       Plan - 11/18/21 1305     Clinical Impression Statement Pt encouraged to continue using heel lift since it improves gait. Edu on LB and pelvic issues with leg length diffrences. Manual techniques (massage and deep pressure) and stretching to left glute, LB and anterior hip improves discomfort. Focus on proximal strengthening and balance with exercise.    Stability/Clinical Decision Making Stable/Uncomplicated    Clinical Decision Making Low    Rehab Potential Good    PT Frequency 2x /  week    PT Duration 8 weeks    PT Treatment/Interventions ADLs/Self Care Home Management;Aquatic Therapy;Cryotherapy;Electrical Stimulation;Gait training;Ultrasound;Traction;Moist Heat;Stair training;Functional mobility training;Therapeutic activities;Therapeutic exercise;Balance training;Neuromuscular re-education;Manual techniques;Patient/family education;Passive range of motion;Dry needling;Taping    PT Next Visit Plan May benefit  from increased land based therapy:  land- gait training, CKC strength and endurance;  pool- hip abductor strength, balance.    PT Home Exercise Plan [id number]             Patient will benefit from skilled therapeutic intervention in order to improve the following deficits and impairments:  Abnormal gait, Decreased range of motion, Difficulty walking, Decreased activity tolerance, Pain, Improper body mechanics, Impaired flexibility, Decreased balance, Decreased scar mobility, Decreased strength, Postural dysfunction  Visit Diagnosis: Abnormal posture  Difficulty in walking, not elsewhere classified  Muscle weakness (generalized)     Problem List Patient Active Problem List   Diagnosis Date Noted   Pain due to onychomycosis of toenails of both feet 08/22/2021   Intertrochanteric fracture of left hip ([place]) 06/24/2021   Closed left hip fracture, initial encounter ([place]) 06/20/2021   Hypokalemia 06/20/2021   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   Cognitive complaints 06/02/2016   Hypoxemic respiratory failure, chronic ([place]) 09/19/2015   MCI (mild cognitive impairment) 03/06/2015   Depression with anxiety 03/06/2015   Bullae 07/02/2014   History of MRSA infection 11/20/2012   Low back pain 07/07/2012   Insomnia 04/28/2012   Coronary Artery Disease 04/19/2012   Hyperlipidemia 04/19/2012   Anxiety 04/19/2012   Non-ST elevation myocardial infarction (NSTEMI), initial care episode ([place]) 03/28/2012   HYPERTENSION, BENIGN 06/12/2010   Dyspnea 06/12/2010   Prolonged  QT interval 06/12/2010    Annamarie Major) Jaydeen Odor MPT  11/18/2021, 1:11 PM  [facility] [address] [place], [place], [address] Phone: [phone]   Fax:  [phone]  Name: Carrie Chung MRN: [id number] Date of Birth: 29-Aug-1941

## 2021-11-20 ENCOUNTER — Ambulatory Visit (HOSPITAL_BASED_OUTPATIENT_CLINIC_OR_DEPARTMENT_OTHER): Payer: Medicare Other | Admitting: Physical Therapy

## 2021-11-24 ENCOUNTER — Ambulatory Visit (HOSPITAL_BASED_OUTPATIENT_CLINIC_OR_DEPARTMENT_OTHER): Payer: Medicare Other | Attending: Physical Medicine and Rehabilitation | Admitting: Physical Therapy

## 2021-11-24 ENCOUNTER — Other Ambulatory Visit: Payer: Self-pay

## 2021-11-24 ENCOUNTER — Encounter (HOSPITAL_BASED_OUTPATIENT_CLINIC_OR_DEPARTMENT_OTHER): Payer: Self-pay | Admitting: Physical Therapy

## 2021-11-24 ENCOUNTER — Ambulatory Visit (HOSPITAL_BASED_OUTPATIENT_CLINIC_OR_DEPARTMENT_OTHER): Payer: Medicare Other | Admitting: Physical Therapy

## 2021-11-24 DIAGNOSIS — R293 Abnormal posture: Secondary | ICD-10-CM | POA: Diagnosis not present

## 2021-11-24 DIAGNOSIS — R262 Difficulty in walking, not elsewhere classified: Secondary | ICD-10-CM | POA: Insufficient documentation

## 2021-11-24 DIAGNOSIS — M25552 Pain in left hip: Secondary | ICD-10-CM | POA: Diagnosis not present

## 2021-11-24 DIAGNOSIS — M6281 Muscle weakness (generalized): Secondary | ICD-10-CM | POA: Insufficient documentation

## 2021-11-24 NOTE — Therapy (Signed)
[facility] [address] [place], [place], [address] Phone: [phone]   Fax:  [phone]  Physical Therapy Treatment  Patient Details  Name: Carrie Chung MRN: [id number] Date of Birth: [birth year] Referring Provider (PT): Edmonia Lynch, MD   Encounter Date: 11/24/2021   PT End of Session - 11/24/21 1058     Visit Number 23    Number of Visits 36    Date for PT Re-Evaluation 01/01/22    Authorization Type MCR    Progress Note Due on Visit 30    PT Start Time 1100    PT Stop Time 1140    PT Time Calculation (min) 40 min    Activity Tolerance Patient tolerated treatment well    Behavior During Therapy [facility] for tasks assessed/performed             Past Medical History:  Diagnosis Date   Atrial tachycardia ([place])    a. noted at cardiac rehab 5/13; event monitor ordered to assess for AFib   CAD (coronary artery disease)    a. NSTEMI 03/2012 ([place] 03/27/12: pLAD 30%, mLAD 99%, mCFX 95%, mRCA 99% with thrombus, EF 65%);  s/p PTCA/DESx1 to prox-mid RCA 03/27/12 urgently in setting of hypotension/bradycardia, with staged PTCA/Evolve study stent to mid LAD & PTCA/Evolve study stent to prox LCx 03/29/12 ;   echo 03/28/12:EF 60%, Aortic sclerosis without AS, mild RVE, mild reduced RVSF     Endometrial polyp    HTN (hypertension)    Hyperlipidemia    MI, old    Sleep apnea     Past Surgical History:  Procedure Laterality Date   APPENDECTOMY  80 years old   CAROTID STENT  04/2012   x3   CESAREAN SECTION  1984   CHOLECYSTECTOMY  1978   INTRAMEDULLARY (IM) NAIL INTERTROCHANTERIC Left 06/20/2021   Procedure: INTRAMEDULLARY (IM) NAIL INTERTROCHANTRIC;  Surgeon: Renette Butters, MD;  Location: [facility];  Service: Orthopedics;  Laterality: Left;   LEFT HEART CATHETERIZATION WITH CORONARY ANGIOGRAM N/A 03/27/2012   Procedure: LEFT HEART CATHETERIZATION WITH CORONARY ANGIOGRAM;  Surgeon: Burnell Blanks, MD;  Location: [facility] CATH LAB;   Service: Cardiovascular;  Laterality: N/A;   PERCUTANEOUS CORONARY STENT INTERVENTION (PCI-S) N/A 03/27/2012   Procedure: PERCUTANEOUS CORONARY STENT INTERVENTION (PCI-S);  Surgeon: Burnell Blanks, MD;  Location: [facility] CATH LAB;  Service: Cardiovascular;  Laterality: N/A;   PERCUTANEOUS CORONARY STENT INTERVENTION (PCI-S) N/A 03/29/2012   Procedure: PERCUTANEOUS CORONARY STENT INTERVENTION (PCI-S);  Surgeon: [name], MD;  Location: [facility] CATH LAB;  Service: Cardiovascular;  Laterality: N/A;   TONSILLECTOMY  80 years old    There were no vitals filed for this visit.   Subjective Assessment - 11/24/21 1059     Subjective Pt states hthe pain is gone at rest but still has 5/10 pain with walking. Pt states the pain is still in the front and lateral aspect of the thigh/hip.    Currently in Pain? No/denies    Pain Score 0-No pain                         Pt seen for aquatic therapy today.  Treatment took place in water 3.25-4.8 ft in depth at the [employer] pool. Temp of water was 91.  Pt entered/exited the pool via stairs step to pattern supervisionwith bilat rail.     Warm up forward, backward and side stepping/walking cues for increased step length, increased speed, hand placement  to increase resistance. 4 widths each. 4 widths with hand buoys submerged for core engagement.   Seated Seated cycling 20x fwd and reto Hip ABD isometric 5s 10x each     Standing Standing adductor and hip flexor stretching 30s 3x each Supported by hand buoys: Add/abd 2x10 Hip flex 2x10 Hip Ext 2 x 10 Deep end traction of L LE- red ankle weight on L ankle, 2x noodle support under armpits for floatation 5 mins (improved gait and reported decreased C-sign pain)   Pt requires buoyancy for support and to offload joints with strengthening exercises. Viscosity of the water is needed for resistance of strengthening; water current perturbations provides challenge to standing balance  unsupported, requiring increased core activation                PT Education - 11/24/21 1153     Education Details anatomy, exercise progression, DOMS expectations, muscle firing    Person(s) Educated Patient    Methods Explanation;Demonstration    Comprehension Verbalized understanding;Returned demonstration              PT Short Term Goals - 11/04/21 1630       PT SHORT TERM GOAL #1   Title pt will demo hip flexion, abd and extension strength at least 4/5    Baseline see flowsheet      PT SHORT TERM GOAL #2   Title pt will be safe in household ambulation with SPC    Baseline most of the time I use the cane in the house, I just use the walker to carry things    Status Achieved      PT SHORT TERM GOAL #3   Title pt will be able to navigate stairs with use of hand rail and SPC to go upstairs in her home    Status Achieved               PT Long Term Goals - 11/04/21 1631       PT LONG TERM GOAL #1   Title Pt will be able to play with her grandchildren without limitations    Baseline still very limited    Status On-going    Target Date 01/01/22      PT LONG TERM GOAL #2   Title pt will be able to ambulate for at least 1 hour without AD for functional ambulation    Baseline still using AD consistently,walking about 500 feet from car to pool and I need to sit because of pain and fatigue    Status On-going      PT LONG TERM GOAL #3   Title pt will be able to stand and cook large meal without rest break    Baseline 9/27: pt has made breakfast stanidng, 11/15 I made a soup today but had help    Status On-going      PT LONG TERM GOAL #4   Title pt will d/c to proper long term program including nustep for cardiac challenges    Status On-going                   Plan - 11/24/21 1119     Clinical Impression Statement Pt with quad and glute soft tissue pain that is consistent with overuse related inflammation. Pt responded well to isometric,  traction, and gentle stretching for pain frelief and ROM increase. Pt reported improvement in pool walking following deep end traction of L hip, suggesting capsular related stiffness. Plan to continue with  joint mobilizations on land as well as improving L LE stance time. Pt's impairment limits their participation with ADL, exercise, and daily mobility. Pt would benefit from continued skilled therapy in order to reach goals and maximize functional L LE strength and ROM for prevention of future falls.    Stability/Clinical Decision Making Stable/Uncomplicated    Rehab Potential Good    PT Frequency 2x / week    PT Duration 8 weeks    PT Treatment/Interventions ADLs/Self Care Home Management;Aquatic Therapy;Cryotherapy;Electrical Stimulation;Gait training;Ultrasound;Traction;Moist Heat;Stair training;Functional mobility training;Therapeutic activities;Therapeutic exercise;Balance training;Neuromuscular re-education;Manual techniques;Patient/family education;Passive range of motion;Dry needling;Taping    PT Next Visit Plan May benefit from increased land based therapy:  land- gait training, CKC strength and endurance;  pool- hip abductor strength, balance.    PT Home Exercise Plan [email or web address]             Patient will benefit from skilled therapeutic intervention in order to improve the following deficits and impairments:  Abnormal gait, Decreased range of motion, Difficulty walking, Decreased activity tolerance, Pain, Improper body mechanics, Impaired flexibility, Decreased balance, Decreased scar mobility, Decreased strength, Postural dysfunction  Visit Diagnosis: Abnormal posture  Difficulty in walking, not elsewhere classified  Muscle weakness (generalized)  Pain in left hip     Problem List Patient Active Problem List   Diagnosis Date Noted   Pain due to onychomycosis of toenails of both feet 08/22/2021   Intertrochanteric fracture of left hip ([place]) 06/24/2021   Closed left hip  fracture, initial encounter ([place]) 06/20/2021   Hypokalemia 06/20/2021   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   Cognitive complaints 06/02/2016   Hypoxemic respiratory failure, chronic ([place]) 09/19/2015   MCI (mild cognitive impairment) 03/06/2015   Depression with anxiety 03/06/2015   Bullae 07/02/2014   History of MRSA infection 11/20/2012   Low back pain 07/07/2012   Insomnia 04/28/2012   Coronary Artery Disease 04/19/2012   Hyperlipidemia 04/19/2012   Anxiety 04/19/2012   Non-ST elevation myocardial infarction (NSTEMI), initial care episode ([place]) 03/28/2012   HYPERTENSION, BENIGN 06/12/2010   Dyspnea 06/12/2010   Prolonged QT interval 06/12/2010   Daleen Bo PT, DPT 11/24/21 11:55 AM   [facility] [address], [place], [address] Phone: [phone]   Fax:  [phone]  Name: Carrie Chung MRN: [id number] Date of Birth: Apr 06, 1941

## 2021-11-25 ENCOUNTER — Telehealth: Payer: Self-pay | Admitting: Internal Medicine

## 2021-11-25 ENCOUNTER — Telehealth (INDEPENDENT_AMBULATORY_CARE_PROVIDER_SITE_OTHER): Payer: Medicare Other | Admitting: Internal Medicine

## 2021-11-25 VITALS — BP 170/74 | HR 71 | Temp 98.7°F

## 2021-11-25 DIAGNOSIS — I252 Old myocardial infarction: Secondary | ICD-10-CM | POA: Diagnosis not present

## 2021-11-25 DIAGNOSIS — U071 COVID-19: Secondary | ICD-10-CM

## 2021-11-25 DIAGNOSIS — I10 Essential (primary) hypertension: Secondary | ICD-10-CM | POA: Diagnosis not present

## 2021-11-25 DIAGNOSIS — F439 Reaction to severe stress, unspecified: Secondary | ICD-10-CM | POA: Diagnosis not present

## 2021-11-25 DIAGNOSIS — Z8679 Personal history of other diseases of the circulatory system: Secondary | ICD-10-CM | POA: Diagnosis not present

## 2021-11-25 DIAGNOSIS — Z7901 Long term (current) use of anticoagulants: Secondary | ICD-10-CM

## 2021-11-25 DIAGNOSIS — I25119 Atherosclerotic heart disease of native coronary artery with unspecified angina pectoris: Secondary | ICD-10-CM | POA: Diagnosis not present

## 2021-11-25 DIAGNOSIS — E7849 Other hyperlipidemia: Secondary | ICD-10-CM | POA: Diagnosis not present

## 2021-11-25 MED ORDER — NIRMATRELVIR/RITONAVIR (PAXLOVID)TABLET: 3.0000 | ORAL_TABLET | Freq: Two times a day (BID) | ORAL | 0 refills | Status: AC

## 2021-11-25 MED ORDER — PROCHLORPERAZINE MALEATE 10 MG PO TABS: 10.0000 mg | ORAL_TABLET | Freq: Three times a day (TID) | ORAL | 0 refills | Status: DC | PRN

## 2021-11-25 NOTE — Telephone Encounter (Signed)
Kathleen Argue [phone]  Camilla called to say that her mom just test positive for COVID, she has a fever, cough, runny nose, she was wanting to know if she would ne a candidate for the Paxlovid. I let her know we would need to do a video visit to discuss.

## 2021-11-25 NOTE — Telephone Encounter (Signed)
scheduled

## 2021-11-25 NOTE — Progress Notes (Signed)
   Subjective:    Patient ID: Carrie Chung, female    DOB: 1940-12-27, 80 y.o.   MRN: [id number]  HPI 80 year old Female seen today by interactive audio and video telecommunications due to coronavirus pandemic.  Patient is agreeable to visit in this format today.  She is identified using 2 identifiers as Carrie Chung, a patient in this practice.  Patient is at her home and I am at my office.  Carrie Chung is tested positive for COVID-19.  Her husband who has dementia and has also tested positive recently for COVID-19.  Son-in-law recently had COVID-69.  Patient has outpatient rehab/PT status post left intertrochanteric fracture which was repaired by Dr. Fredonia Highland in July 2022.  [facility].  Patient has a history of coronary artery disease, hypertension, hyperlipidemia, NSTEMI in 2013 and sleep apnea.  She takes metoprolol 75 mg twice daily and amlodipine 5 mg daily for hypertension and heart disease.  She is on Plavix 75 mg daily  She takes Nexium 40 mg daily for GE reflux and is on Crestor 20 mg daily for hyperlipidemia and coronary artery disease.    Review of Systems reports cough and chills.  Maybe slight nausea.     Objective:   Physical Exam    BP 170/74,, pulse 71, temperature 98.7 she looks fatigued and is slightly pale.  Sounds a bit nasally congested but in no acute respiratory distress. No chest pain or shortness of breath.  Does not have pulse oximetry with her at the interview but will try to find the one in her home.  She thinks it is upstairs.     Assessment & Plan:  Acute COVID-19 virus infection  History of coronary artery disease followed by Dr. Burt Knack  Status post intertrochanteric fracture July 2022  Hyperlipidemia treated with Crestor  Hypertension treated with metoprolol and amlodipine  Situational stress with husband who has dementia  Insomnia treated with Xanax  GE reflux treated with  Nexium  Chronic anticoagulation with Plavix  Plan: Patient to locate her pulse oximeter and monitor pulse ox frequently.  She is to walk around the house to prevent atelectasis.  She is agreeable to being started on Paxlovid. GFR is greater than 60.  She also is requesting Compazine for nausea.  Was prescribed 10 mg tablets every 8 hours as needed for nausea.  Stay well-hydrated.  Call if symptoms not improving in 24 to 48 hours or sooner if worse.  Quarantine for 5 days.

## 2021-11-26 ENCOUNTER — Encounter: Payer: Self-pay | Admitting: Internal Medicine

## 2021-11-26 NOTE — Patient Instructions (Signed)
Rest and drink plenty of fluids.  Walk to prevent atelectasis.  Monitor pulse oximetry.  Take Compazine as needed for nausea.  Quarantine for 5 days.  Take Paxlovid as directed.

## 2021-11-27 ENCOUNTER — Ambulatory Visit (HOSPITAL_BASED_OUTPATIENT_CLINIC_OR_DEPARTMENT_OTHER): Payer: Medicare Other | Admitting: Physical Therapy

## 2021-11-28 ENCOUNTER — Encounter (HOSPITAL_BASED_OUTPATIENT_CLINIC_OR_DEPARTMENT_OTHER): Payer: Medicare Other | Admitting: Physical Therapy

## 2021-12-01 ENCOUNTER — Ambulatory Visit (HOSPITAL_BASED_OUTPATIENT_CLINIC_OR_DEPARTMENT_OTHER): Payer: Self-pay | Admitting: Physical Therapy

## 2021-12-01 ENCOUNTER — Ambulatory Visit: Payer: Medicare Other | Admitting: Pulmonary Disease

## 2021-12-03 ENCOUNTER — Ambulatory Visit (HOSPITAL_BASED_OUTPATIENT_CLINIC_OR_DEPARTMENT_OTHER): Payer: Medicare Other | Admitting: Physical Therapy

## 2021-12-04 ENCOUNTER — Telehealth: Payer: Self-pay | Admitting: Internal Medicine

## 2021-12-04 ENCOUNTER — Ambulatory Visit (HOSPITAL_BASED_OUTPATIENT_CLINIC_OR_DEPARTMENT_OTHER): Payer: Self-pay | Admitting: Physical Therapy

## 2021-12-04 NOTE — Telephone Encounter (Signed)
[place] results to [facility] [id number], phone [phone]   +COVID 11/25/2021

## 2021-12-05 ENCOUNTER — Encounter (HOSPITAL_BASED_OUTPATIENT_CLINIC_OR_DEPARTMENT_OTHER): Payer: Self-pay | Admitting: Physical Therapy

## 2021-12-05 ENCOUNTER — Ambulatory Visit (HOSPITAL_BASED_OUTPATIENT_CLINIC_OR_DEPARTMENT_OTHER): Payer: Medicare Other | Admitting: Physical Therapy

## 2021-12-05 ENCOUNTER — Other Ambulatory Visit: Payer: Self-pay

## 2021-12-05 DIAGNOSIS — R293 Abnormal posture: Secondary | ICD-10-CM | POA: Diagnosis not present

## 2021-12-05 DIAGNOSIS — M6281 Muscle weakness (generalized): Secondary | ICD-10-CM

## 2021-12-05 DIAGNOSIS — M25552 Pain in left hip: Secondary | ICD-10-CM | POA: Diagnosis not present

## 2021-12-05 DIAGNOSIS — R262 Difficulty in walking, not elsewhere classified: Secondary | ICD-10-CM

## 2021-12-05 NOTE — Therapy (Signed)
[facility] [address] [place], [place], [address] Phone: [phone]   Fax:  [phone]  Physical Therapy Treatment  Patient Details  Name: Carrie Chung MRN: [id number] Date of Birth: 07-21-1941 Referring Provider (PT): Edmonia Lynch, MD   Encounter Date: 12/05/2021   PT End of Session - 12/05/21 1104     Visit Number 24    Number of Visits 33    Date for PT Re-Evaluation 01/01/22    Authorization Type MCR    Progress Note Due on Visit 46    PT Start Time 1110   pt arrives late   PT Stop Time 1150    PT Time Calculation (min) 40 min    Activity Tolerance Patient tolerated treatment well    Behavior During Therapy [facility] for tasks assessed/performed              Past Medical History:  Diagnosis Date   Atrial tachycardia ([place])    a. noted at cardiac rehab 5/13; event monitor ordered to assess for AFib   CAD (coronary artery disease)    a. NSTEMI 03/2012 ([place] 03/27/12: pLAD 30%, mLAD 99%, mCFX 95%, mRCA 99% with thrombus, EF 65%);  s/p PTCA/DESx1 to prox-mid RCA 03/27/12 urgently in setting of hypotension/bradycardia, with staged PTCA/Evolve study stent to mid LAD & PTCA/Evolve study stent to prox LCx 03/29/12 ;   echo 03/28/12:EF 60%, Aortic sclerosis without AS, mild RVE, mild reduced RVSF     Endometrial polyp    HTN (hypertension)    Hyperlipidemia    MI, old    Sleep apnea     Past Surgical History:  Procedure Laterality Date   APPENDECTOMY  80 years old   CAROTID STENT  04/2012   x3   CESAREAN SECTION  1984   CHOLECYSTECTOMY  1978   INTRAMEDULLARY (IM) NAIL INTERTROCHANTERIC Left 06/20/2021   Procedure: INTRAMEDULLARY (IM) NAIL INTERTROCHANTRIC;  Surgeon: Renette Butters, MD;  Location: [facility];  Service: Orthopedics;  Laterality: Left;   LEFT HEART CATHETERIZATION WITH CORONARY ANGIOGRAM N/A 03/27/2012   Procedure: LEFT HEART CATHETERIZATION WITH CORONARY ANGIOGRAM;  Surgeon: Burnell Blanks, MD;   Location[facility] CATH LAB;  Service: Cardiovascular;  Laterality: N/A;   PERCUTANEOUS CORONARY STENT INTERVENTION (PCI-S) N/A 03/27/2012   Procedure: PERCUTANEOUS CORONARY STENT INTERVENTION (PCI-S);  Surgeon: Burnell Blanks, MD;  Location: [facility] CATH LAB;  Service: Cardiovascular;  Laterality: N/A;   PERCUTANEOUS CORONARY STENT INTERVENTION (PCI-S) N/A 03/29/2012   Procedure: PERCUTANEOUS CORONARY STENT INTERVENTION (PCI-S);  Surgeon: [name], MD;  Location: [facility] CATH LAB;  Service: Cardiovascular;  Laterality: N/A;   TONSILLECTOMY  80 years old    Visit Diagnosis: Difficulty in walking, not elsewhere classified  Muscle weakness (generalized)  Pain in left hip     Subjective Assessment - 11/24/21 1059       Subjective Pt states she feels better after COVID. She states she does get  tired very easily now. Standing is very tiring. Her hip pain was sore/tried after last session.    Currently in Pain? No/denies     Pain Score 0-No pain           12/16 TREATMENT:  L hip LAD grade II in open pack Inf hip mob Mulligan belt grade III  ABD Iso with belt 5s 15x Standing adductor stretch 30s 2x Table sidestepping 6x  Heel toe rocking 20x    Previous: Pt seen for aquatic therapy today.  Treatment took place in water 3.25-4.8  ft in depth at the [employer] [facility] pool. Temp of water was 91.  Pt entered/exited the pool via stairs step to pattern supervisionwith bilat rail.     Warm up forward, backward and side stepping/walking cues for increased step length, increased speed, hand placement to increase resistance. 4 widths each. 4 widths with hand buoys submerged for core engagement.   Seated Seated cycling 20x fwd and reto Hip ABD isometric 5s 10x each     Standing Standing adductor and hip flexor stretching 30s 3x each Supported by hand buoys: Add/abd 2x10 Hip flex 2x10 Hip Ext 2 x 10 Deep end traction of L LE- red ankle weight on L ankle, 2x noodle support under  armpits for floatation 5 mins (improved gait and reported decreased C-sign pain)   Pt requires buoyancy for support and to offload joints with strengthening exercises. Viscosity of the water is needed for resistance of strengthening; water current perturbations provides challenge to standing balance unsupported, requiring increased core activation          Education Details anatomy, exercise progression, DOMS expectations, muscle firing     Person(s) Educated Patient     Methods Explanation;Demonstration     Comprehension Verbalized understanding;Returned demonstration        Access Code: [email or web address] URL: [email or web address] Date: 12/05/2021 Prepared by: Daleen Bo  Exercises Hooklying Isometric Hip Abduction with Belt - 2 x daily - 7 x weekly - 1 sets - 3 reps - 30 hold Lateral Lunge Adductor Stretch with Counter Support - 2 x daily - 7 x weekly - 1 sets - 3 reps - 30 hold Standing Hip Flexor Stretch - 2 x daily - 7 x weekly - 1 sets - 3 reps - 30 hold              PT SHORT TERM GOAL #1    Title pt will demo hip flexion, abd and extension strength at least 4/5     Baseline see flowsheet          PT SHORT TERM GOAL #2    Title pt will be safe in household ambulation with SPC     Baseline most of the time I use the cane in the house, I just use the walker to carry things     Status Achieved          PT SHORT TERM GOAL #3    Title pt will be able to navigate stairs with use of hand rail and SPC to go upstairs in her home     Status Achieved        PT LONG TERM GOAL #1    Title Pt will be able to play with her grandchildren without limitations     Baseline still very limited     Status On-going     Target Date 01/01/22          PT LONG TERM GOAL #2    Title pt will be able to ambulate for at least 1 hour without AD for functional ambulation     Baseline still using AD consistently,walking about 500 feet from car to pool and I need to sit because of  pain and fatigue     Status On-going          PT LONG TERM GOAL #3    Title pt will be able to stand and cook large meal without rest break     Baseline 9/27: pt has made breakfast stanidng, 11/15 I made a  soup today but had help     Status On-going          PT LONG TERM GOAL #4    Title pt will d/c to proper long term program including nustep for cardiac challenges     Status On-going             Clinical Impression Statement  Pt with very good tolerance to isometrics and L hip joint mobilizations at today's session. Pt reports less stiffness following lateral and inf mobilizations. Pt able to increase stance time with gait following treatment. Pt with significant capsular as well as medial soft tissue restrictions limited hip ABD and flexion. Plan to continue with aquatic for mobility and land based appts for ROM. Pt given revised HEP due to mixed compliance at home. Pt would benefit from continued skilled therapy in order to reach goals and maximize functional L LE strength and ROM for prevention of future falls.     Stability/Clinical Decision Making Stable/Uncomplicated     Rehab Potential Good     PT Frequency 2x / week     PT Duration 8 weeks     PT Treatment/Interventions ADLs/Self Care Home Management;Aquatic Therapy;Cryotherapy;Electrical Stimulation;Gait training;Ultrasound;Traction;Moist Heat;Stair training;Functional mobility training;Therapeutic activities;Therapeutic exercise;Balance training;Neuromuscular re-education;Manual techniques;Patient/family education;Passive range of motion;Dry needling;Taping     PT Next Visit Plan May benefit from increased land based therapy:  land- gait training, CKC strength and endurance;  pool- hip abductor strength, balance.     PT Home Exercise Plan [email or web address]                   Patient will benefit from skilled therapeutic intervention in order to improve the following deficits and impairments:  Abnormal gait, Decreased range of motion,  Difficulty walking, Decreased activity tolerance, Pain, Improper body mechanics, Impaired flexibility, Decreased balance, Decreased scar mobility, Decreased strength, Postural dysfunction    Problem List Patient Active Problem List   Diagnosis Date Noted   Pain due to onychomycosis of toenails of both feet 08/22/2021   Intertrochanteric fracture of left hip ([place]) 06/24/2021   Closed left hip fracture, initial encounter ([place]) 06/20/2021   Hypokalemia 06/20/2021   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   Cognitive complaints 06/02/2016   Hypoxemic respiratory failure, chronic ([facility]) 09/19/2015   MCI (mild cognitive impairment) 03/06/2015   Depression with anxiety 03/06/2015   Bullae 07/02/2014   History of MRSA infection 11/20/2012   Low back pain 07/07/2012   Insomnia 04/28/2012   Coronary Artery Disease 04/19/2012   Hyperlipidemia 04/19/2012   Anxiety 04/19/2012   Non-ST elevation myocardial infarction (NSTEMI), initial care episode ([place]) 03/28/2012   HYPERTENSION, BENIGN 06/12/2010   Dyspnea 06/12/2010   Prolonged QT interval 06/12/2010   Daleen Bo PT, DPT 12/05/21 11:58 AM   [facility] [place], [place], [address] Phone: [phone]   Fax:  [phone]  Name: Carrie Chung MRN: [id number] Date of Birth: 06/11/1941

## 2021-12-07 IMAGING — CR DG HIP (WITH OR WITHOUT PELVIS) 2-3V*L*
3 series · 3 of 3 positions shown · non-contrast
Comparison: 10/04/2017

CLINICAL DATA: Fall, left hip pain

EXAM:
DG HIP (WITH OR WITHOUT PELVIS) 2-3V LEFT

[pelvis ap]
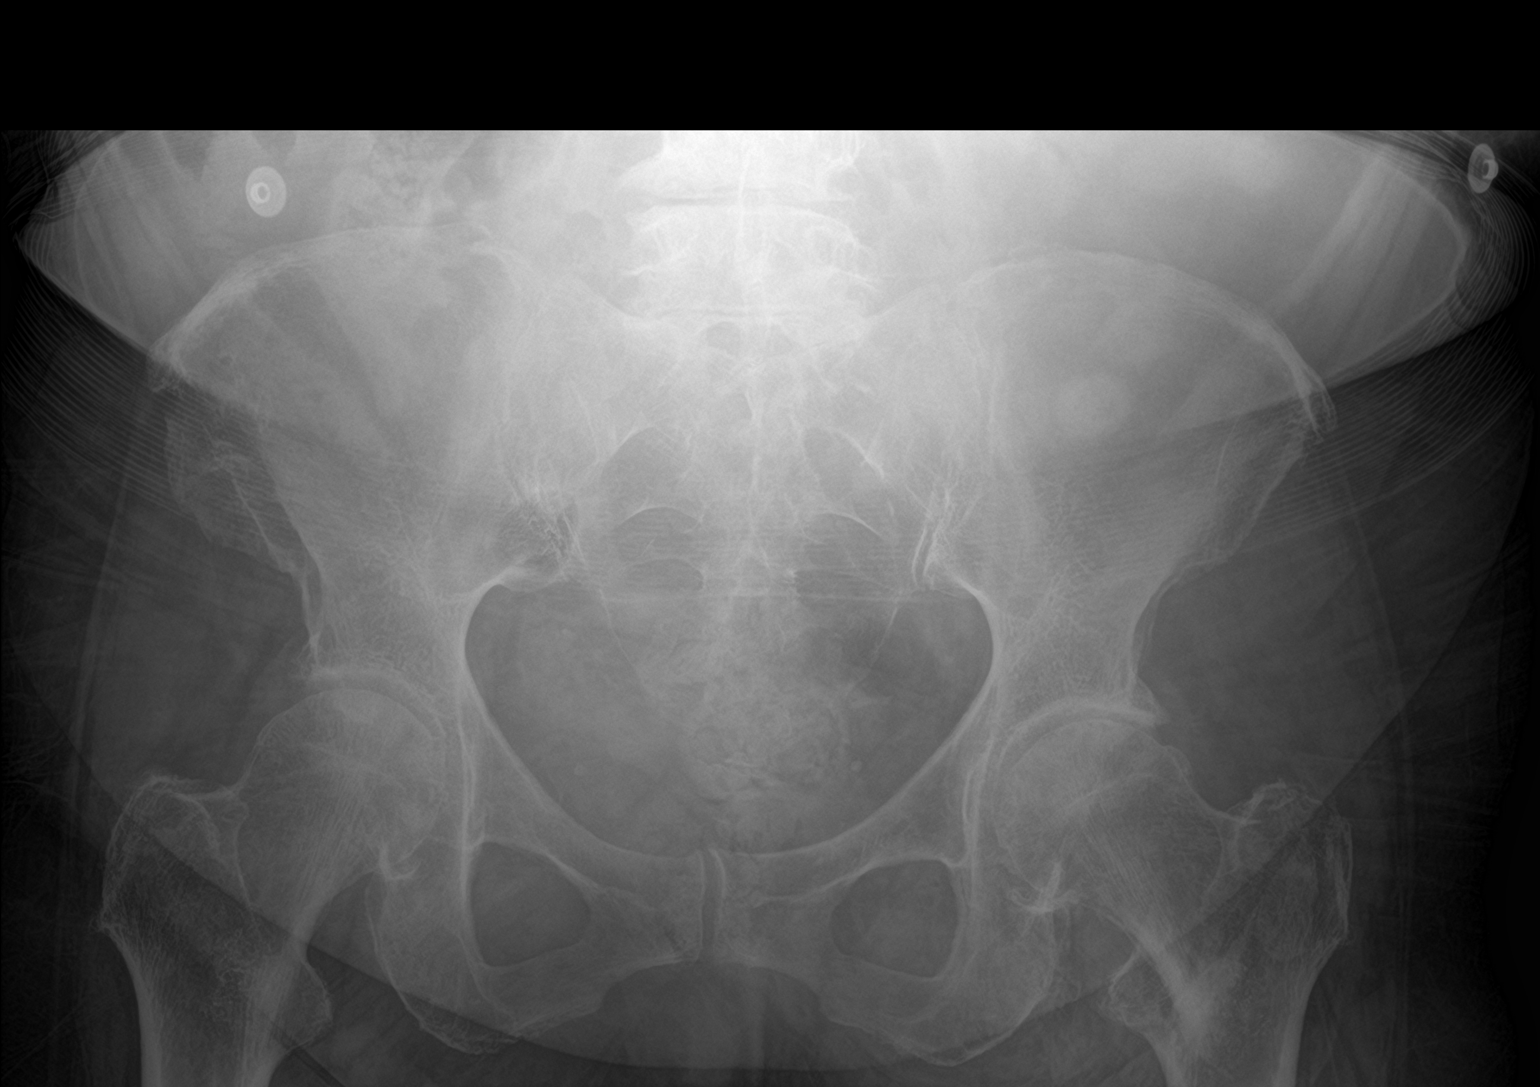

[hip ap]
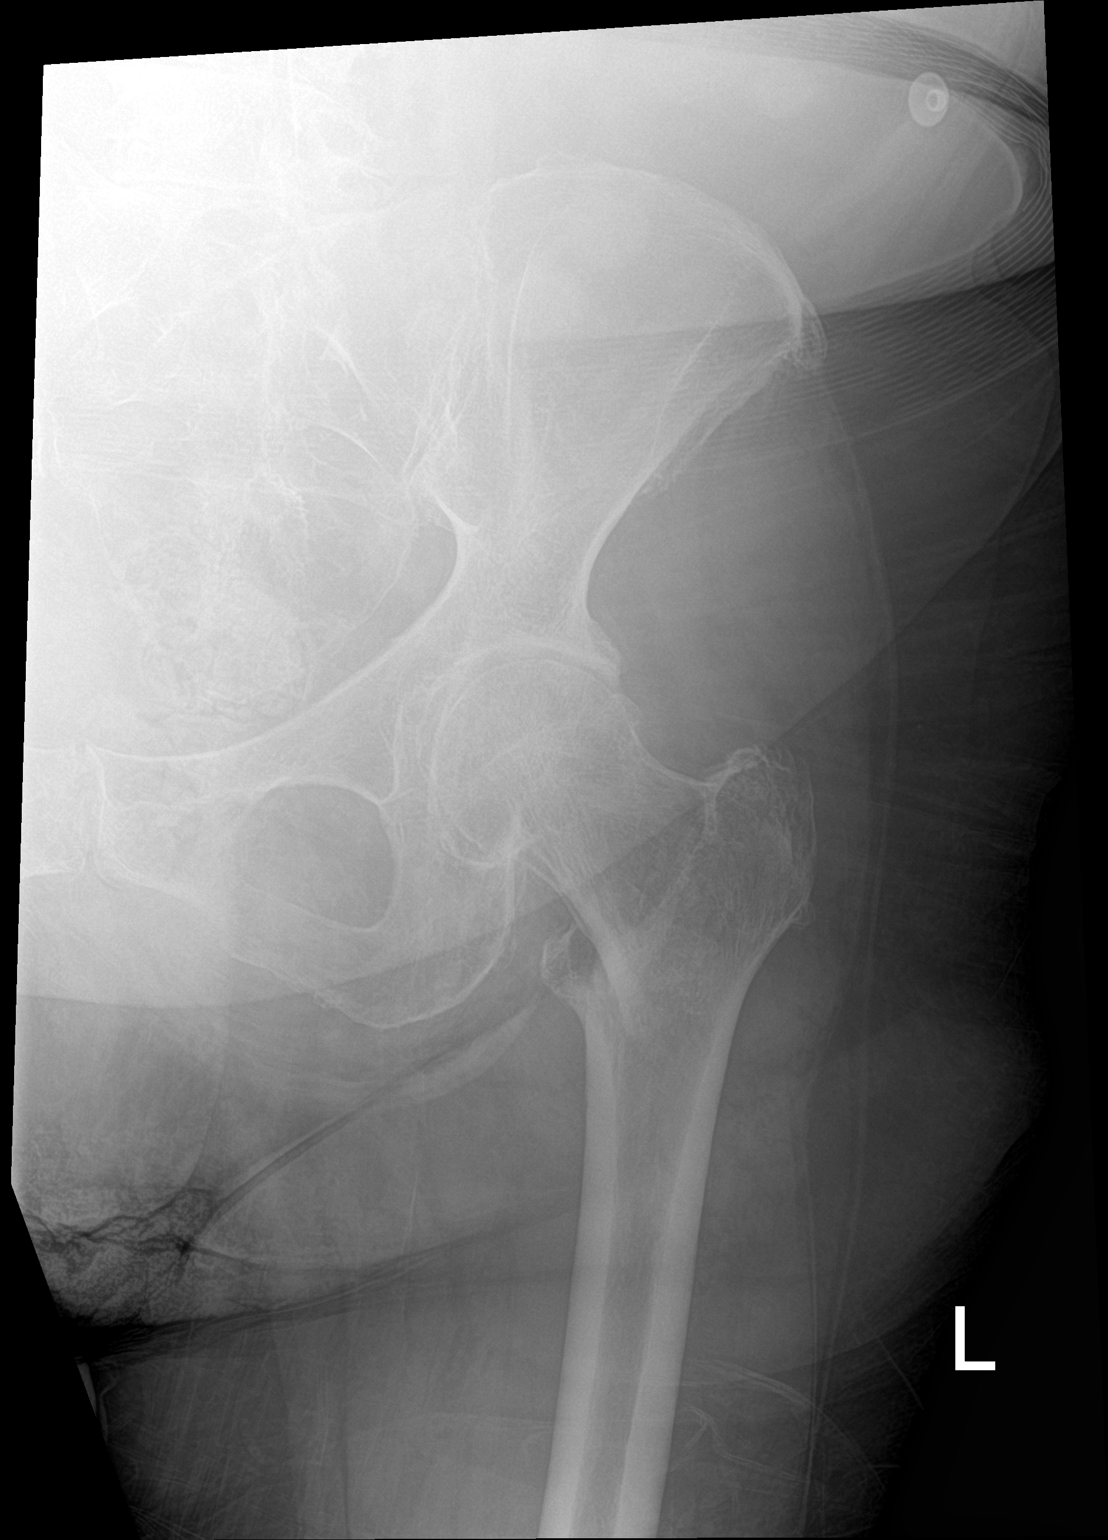

[hip lat]
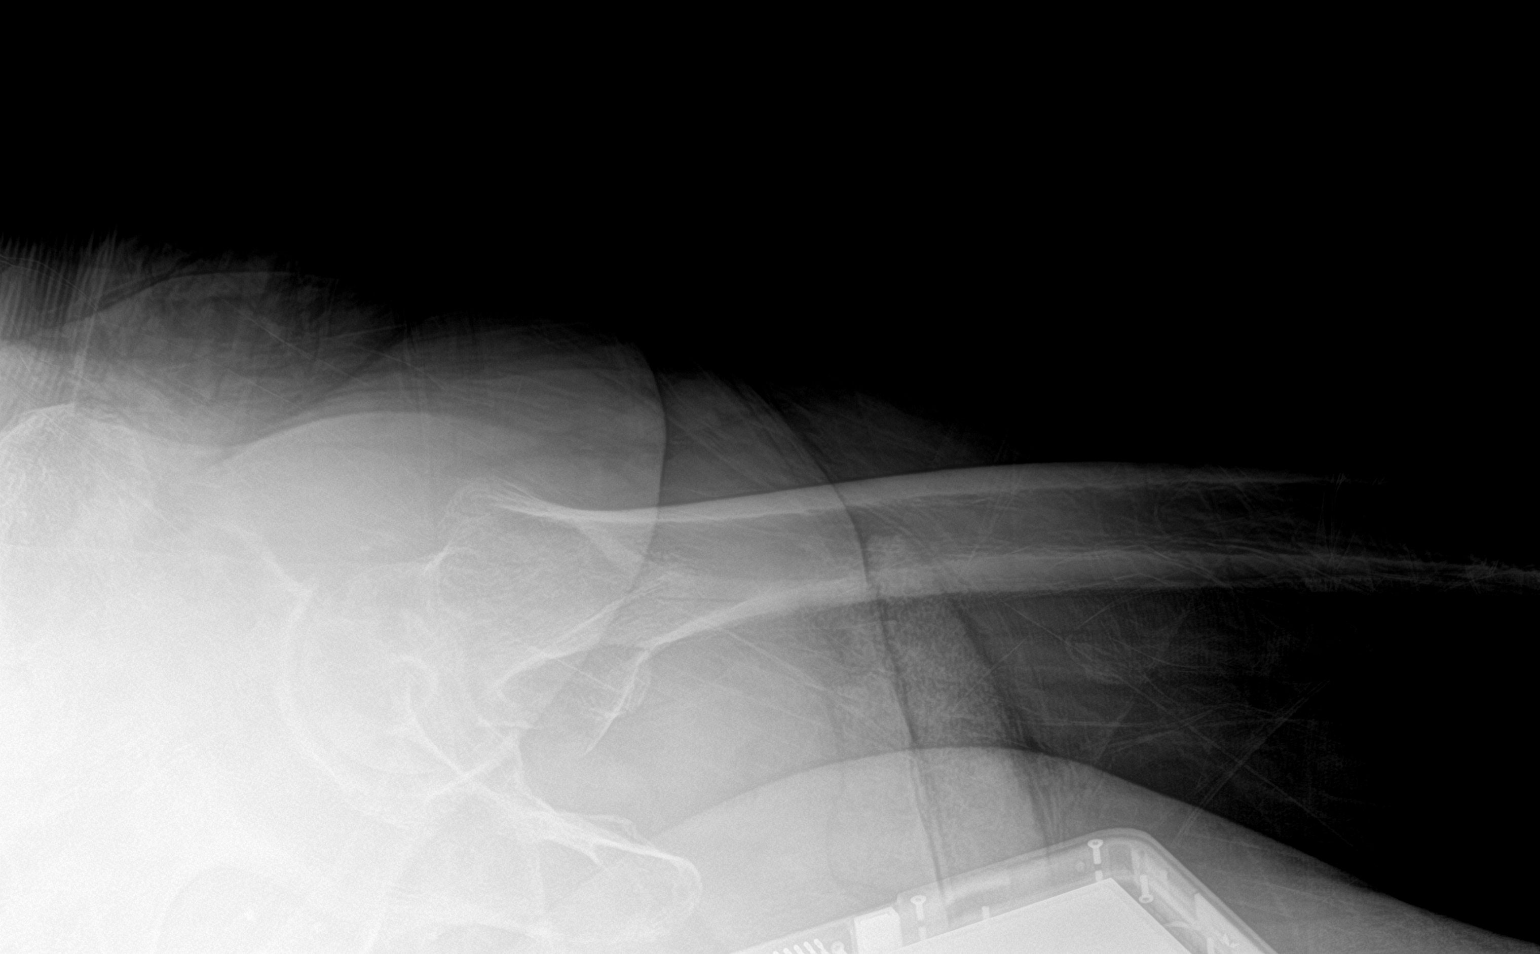

[3 of 3 positions shown; findings below may reference images not displayed]

FINDINGS: There is an age-indeterminate impacted basicervical left hip
fracture. There is suggestion of trabecula bridging the fracture
plane on lateral examination suggesting that this may be healed,
however, this is not optimally assessed on this examination. There
is no dislocation. No other fracture identified. Mild left hip
degenerative arthritis is noted.
IMPRESSION: Age indeterminate basicervical fracture of the left hip. Dedicated
CT imaging is recommended for further evaluation.

## 2021-12-07 IMAGING — CR DG WRIST COMPLETE 3+V*L*
3 series · 3 of 3 positions shown · non-contrast
Comparison: None.

CLINICAL DATA: Fall, left wrist pain

EXAM:
LEFT WRIST - COMPLETE 3+ VIEW

[wrist pa]
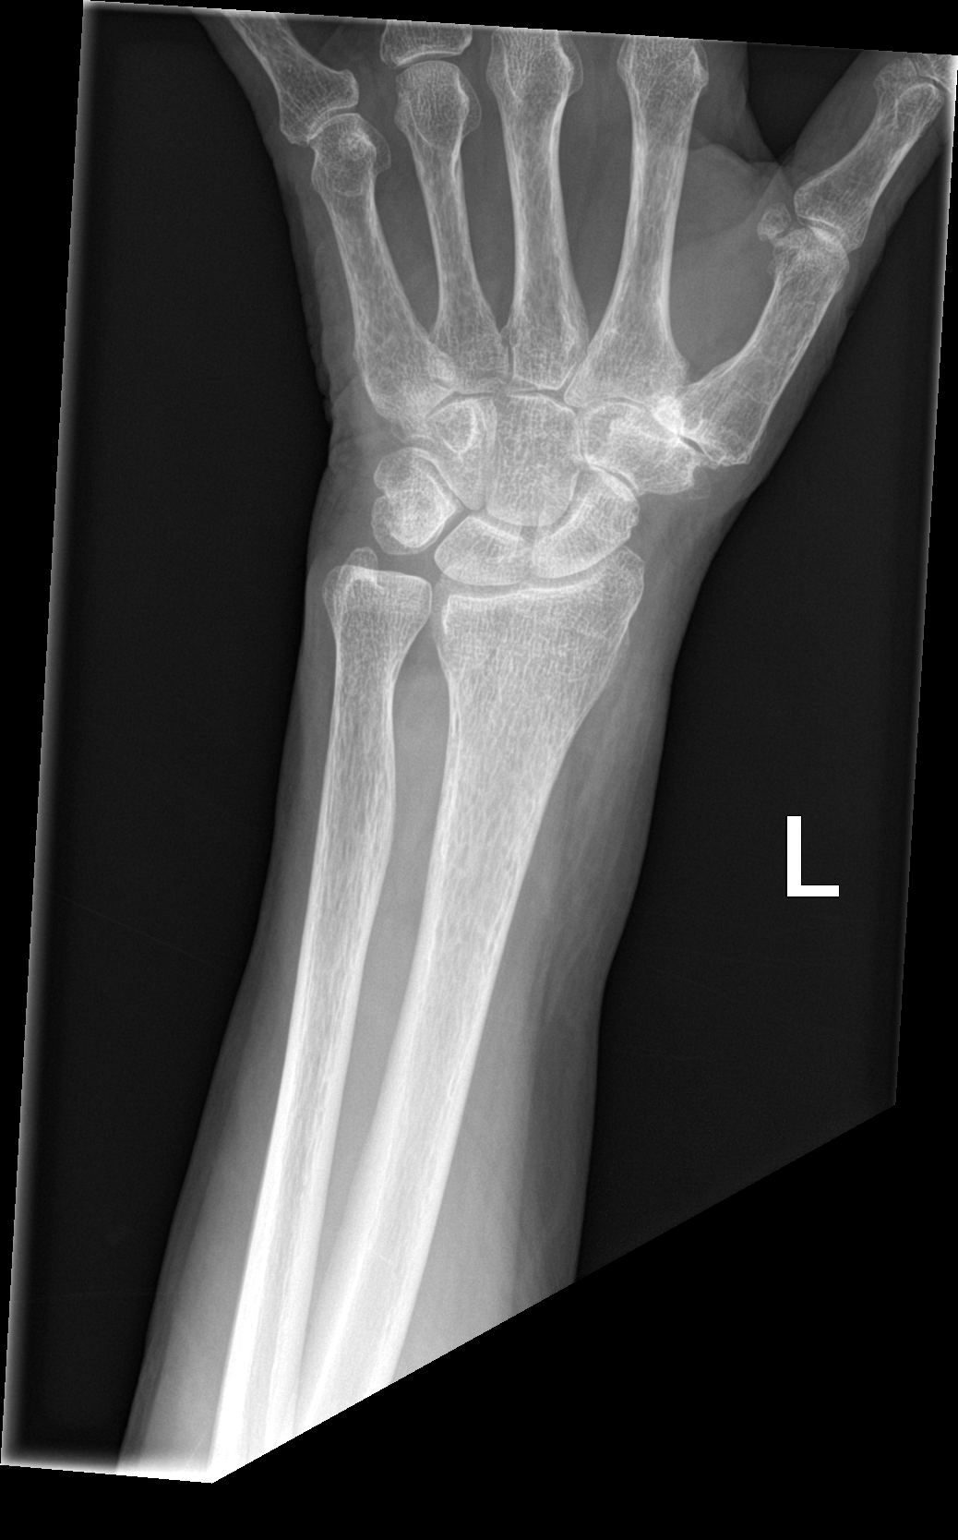

[wrist obl]
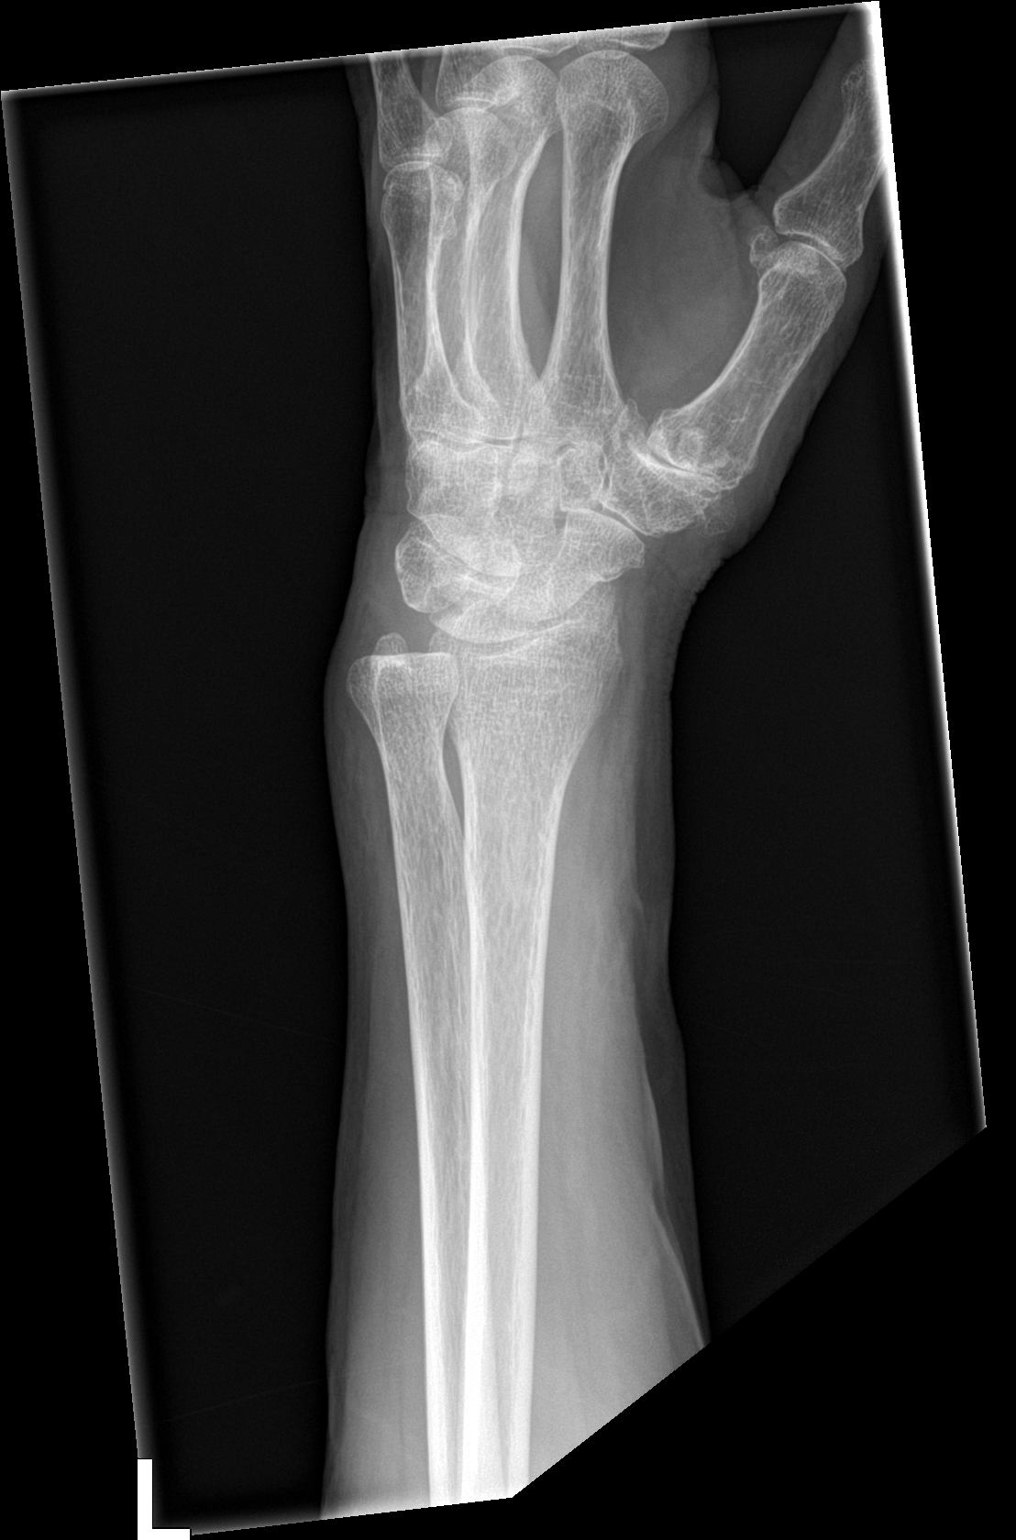

[wrist lat]
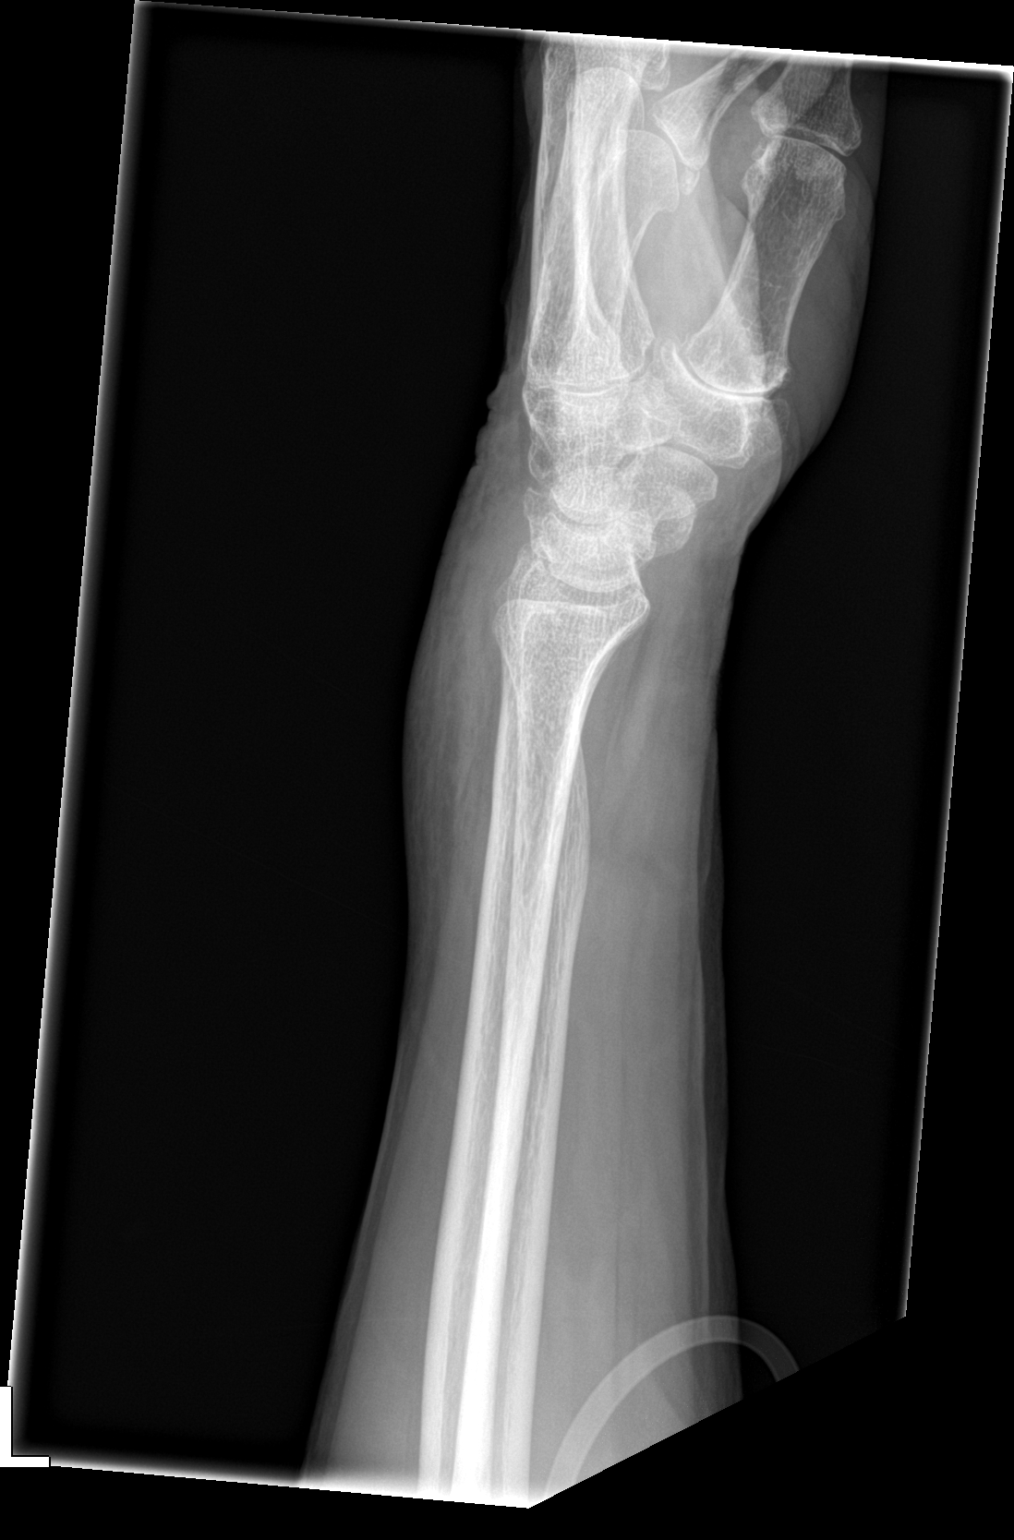

[3 of 3 positions shown; findings below may reference images not displayed]

FINDINGS: Three view radiograph left wrist demonstrates normal alignment. No
fracture or dislocation. Joint spaces are preserved. There is
moderate soft tissue swelling dorsal to the a distal radius and
ulna.
IMPRESSION: Soft tissue swelling.  No fracture or dislocation.

## 2021-12-07 IMAGING — CT CT HIP*L* W/O CM
2 of 3 series · 17 of 46 positions shown, 19 images · non-contrast
Comparison: Plain films earlier today

CLINICAL DATA: Fall

EXAM:
CT OF THE LEFT HIP WITHOUT CONTRAST
TECHNIQUE: Multidetector CT imaging of the left hip was performed according to
the standard protocol. Multiplanar CT image reconstructions were
also generated.

[Series 5: left hip 2.0 st · axial · 0.51mm/px · z∈[+772,+928]mm · 14 of 90 slices shown, 16 images]
[im 6/90  soft-tissue]
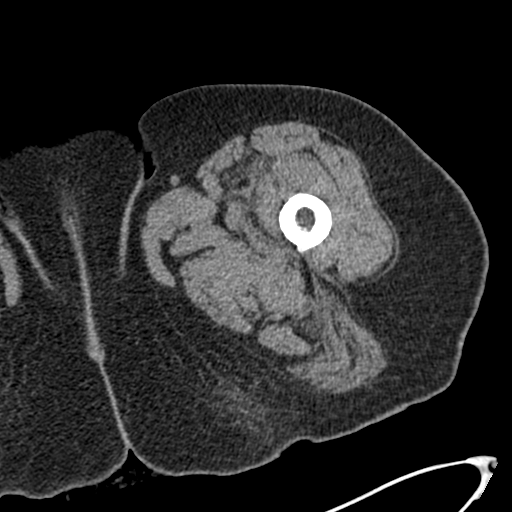
[im 6/90  bone]
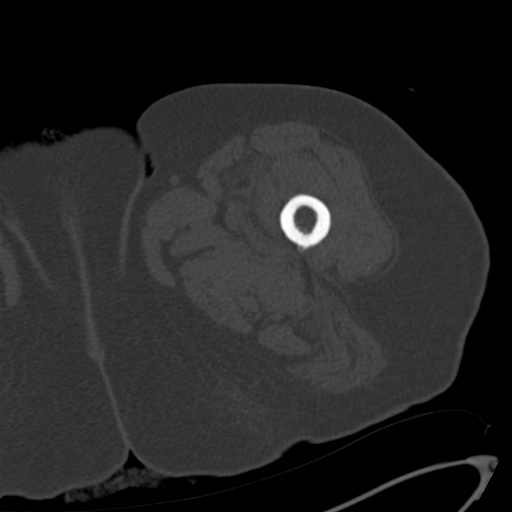
[im 12/90  soft-tissue]
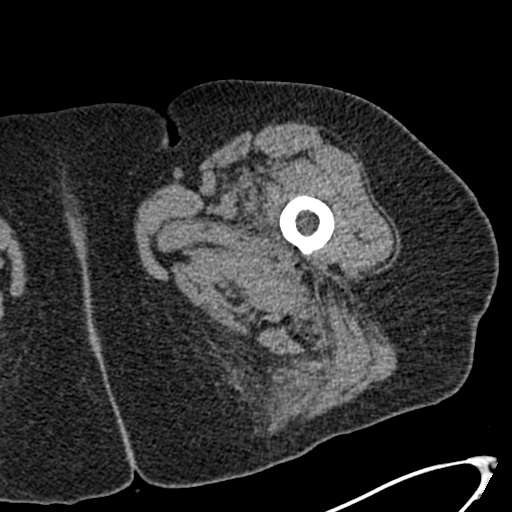
[im 18/90  soft-tissue]
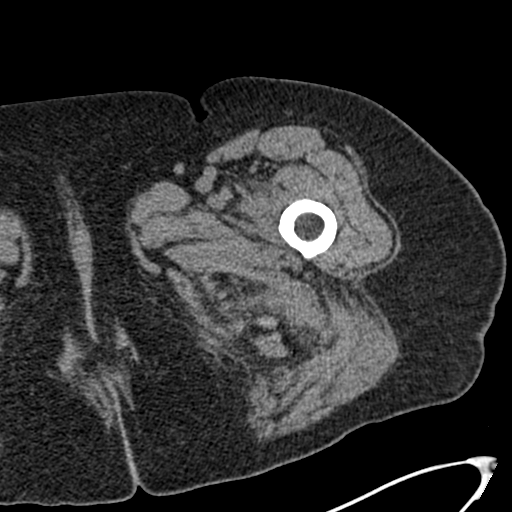
[im 23/90  soft-tissue]
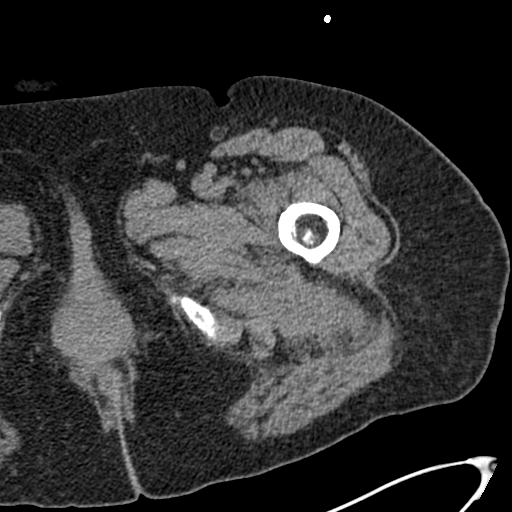
[im 29/90  soft-tissue]
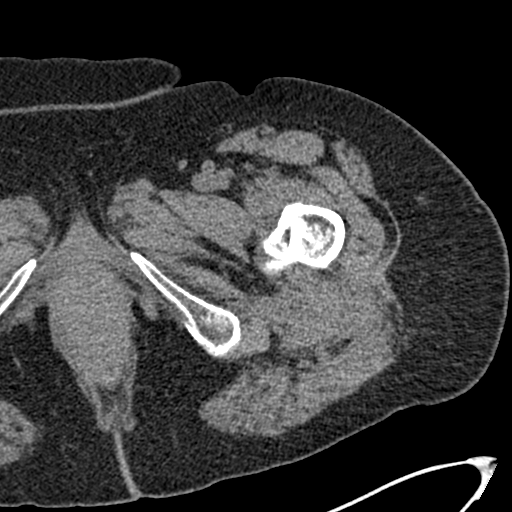
[im 35/90  soft-tissue]
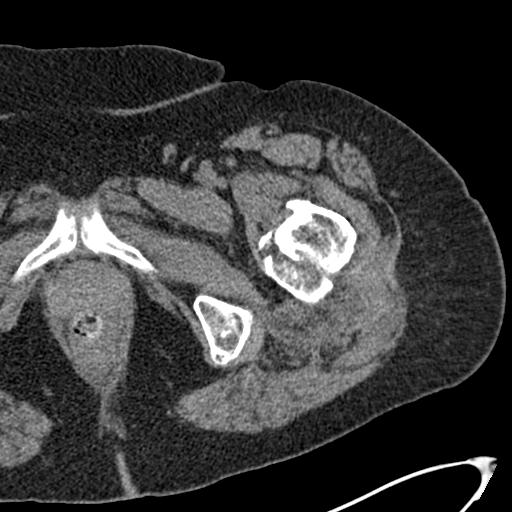
[im 41/90  soft-tissue]
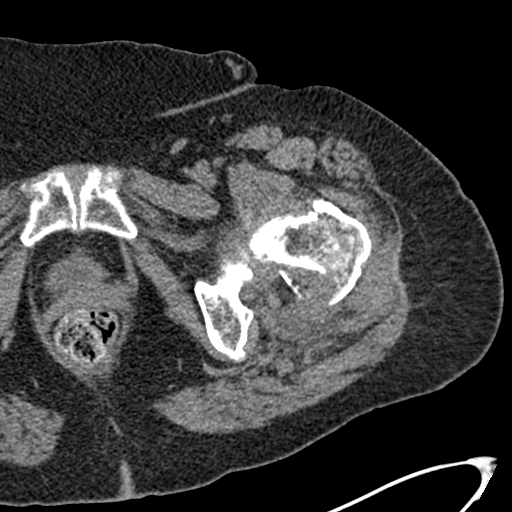
[im 49/90  soft-tissue]
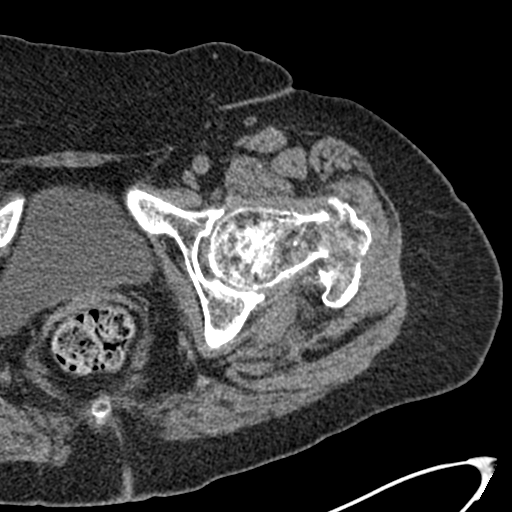
[im 55/90  soft-tissue]
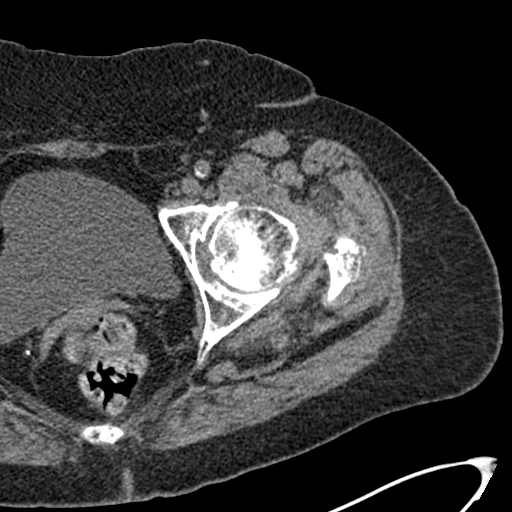
[im 55/90  bone]
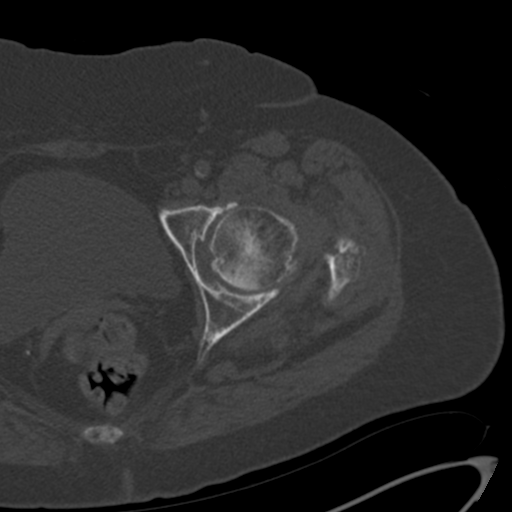
[im 61/90  soft-tissue]
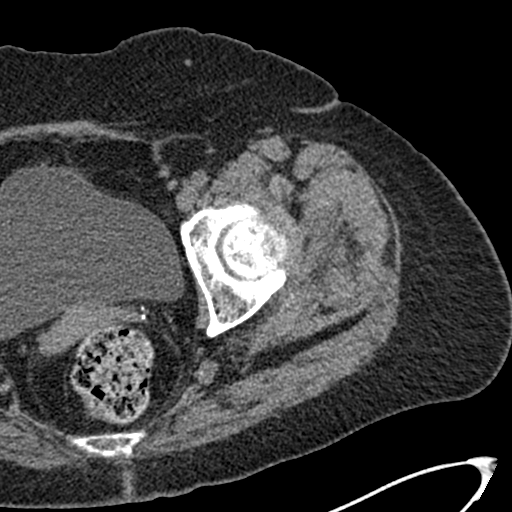
[im 67/90  soft-tissue]
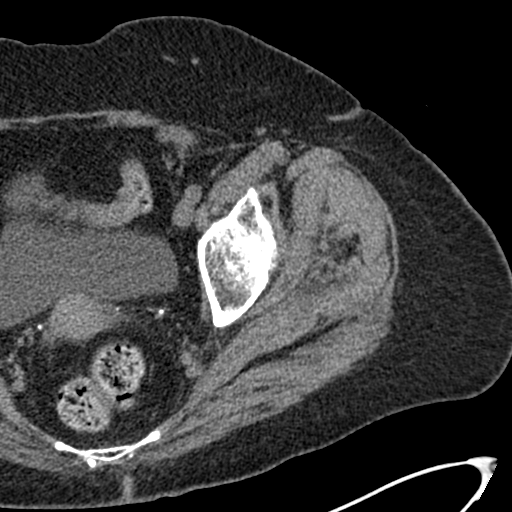
[im 72/90  soft-tissue]
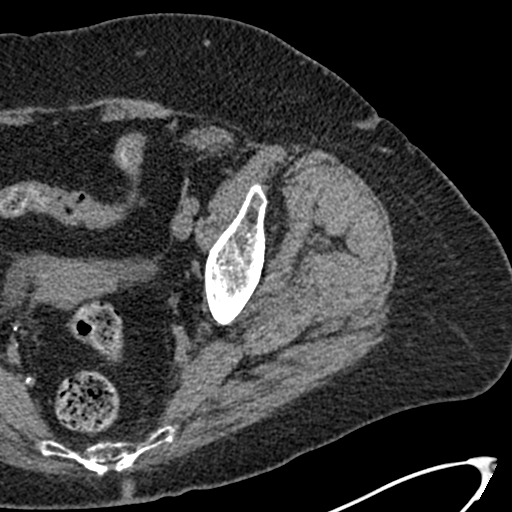
[im 78/90  soft-tissue]
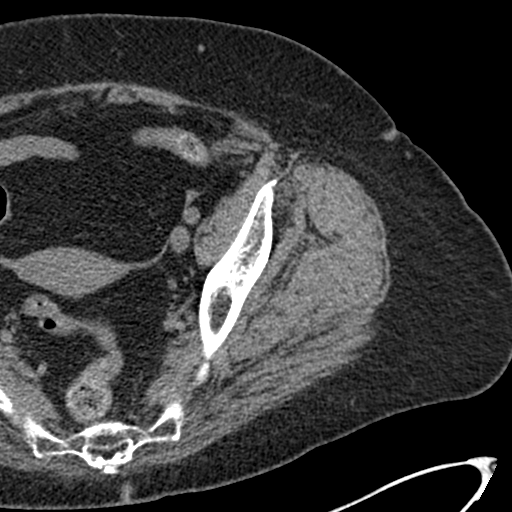
[im 84/90  soft-tissue]
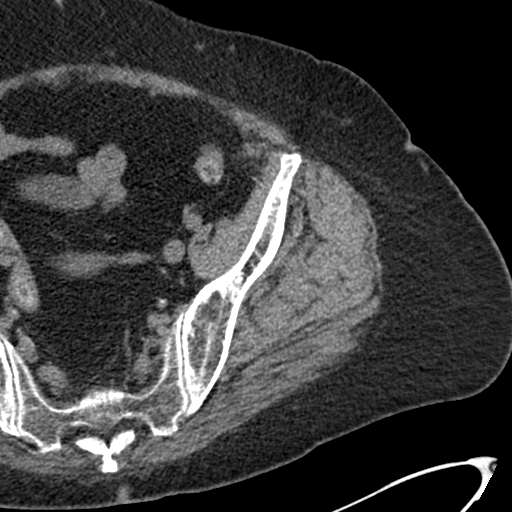

[Series 9: left hip 2.0 cor. st · coronal · 0.35mm/px · 3 of 120 slices shown]
[im 40/120  soft-tissue]
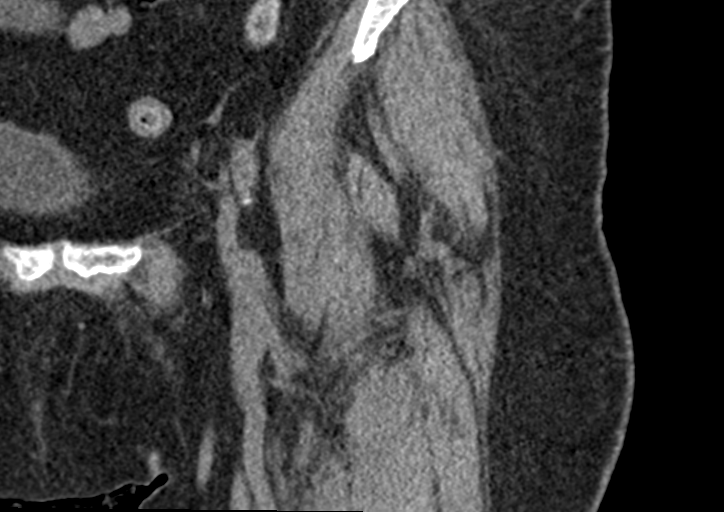
[im 53/120  soft-tissue]
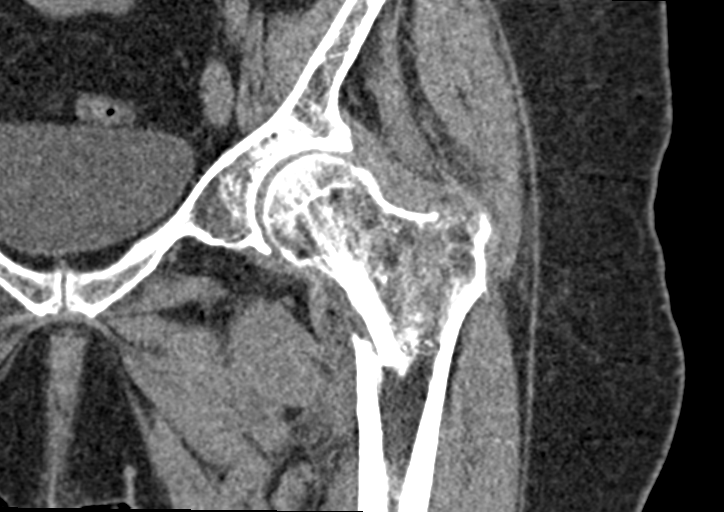
[im 67/120  soft-tissue]
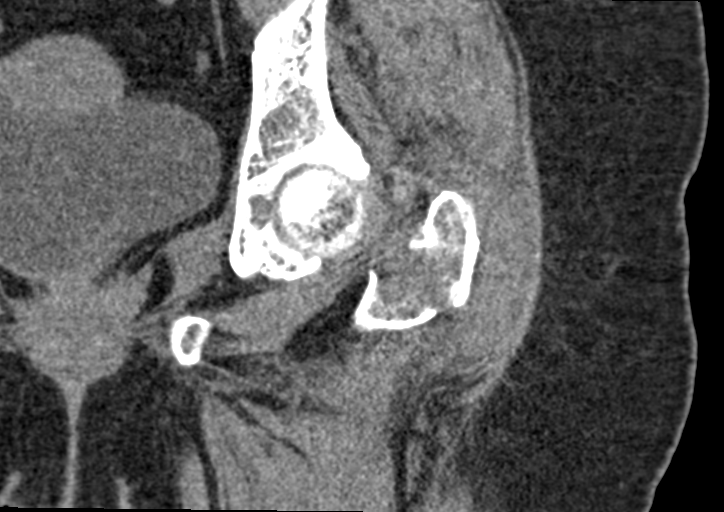

[17 of 46 positions shown; findings below may reference images not displayed]

FINDINGS: Bones/Joint/Cartilage

Left femoral intertrochanteric fracture noted. The fracture is
mildly comminuted and impacted. No subluxation or dislocation.
Fracture is acute.

Ligaments

Suboptimally assessed by CT.

Muscles and Tendons

Unremarkable

Soft tissues

Unremarkable.
IMPRESSION: Comminuted, impacted acute left femoral intertrochanteric fracture.

## 2021-12-08 ENCOUNTER — Ambulatory Visit (HOSPITAL_BASED_OUTPATIENT_CLINIC_OR_DEPARTMENT_OTHER): Payer: Self-pay | Admitting: Physical Therapy

## 2021-12-08 ENCOUNTER — Telehealth: Payer: Self-pay

## 2021-12-08 ENCOUNTER — Ambulatory Visit (HOSPITAL_BASED_OUTPATIENT_CLINIC_OR_DEPARTMENT_OTHER): Payer: Medicare Other | Admitting: Physical Therapy

## 2021-12-08 NOTE — Telephone Encounter (Signed)
11/25/21 patient states she had covid. Finished paxlovid. Tested again on Saturday and still tested positive with symptoms. She has been advised that you can test positive for a while after initial testing. She states that hs has lots of congestion in her head and a cough. Coricedan helps a little but hte cough causes her head to hurt. She also is keeping an eye on her O2. She wants to have sleep hypoxia in her chart and sleep apnea taken out.

## 2021-12-08 NOTE — Telephone Encounter (Signed)
Patient has denied appointment and chest xray at this time. She states that she doesn't want to leave the house and that she thinks she is improving.

## 2021-12-10 ENCOUNTER — Encounter (HOSPITAL_BASED_OUTPATIENT_CLINIC_OR_DEPARTMENT_OTHER): Payer: Medicare Other | Admitting: Physical Therapy

## 2021-12-10 ENCOUNTER — Ambulatory Visit (HOSPITAL_BASED_OUTPATIENT_CLINIC_OR_DEPARTMENT_OTHER): Payer: Self-pay | Admitting: Physical Therapy

## 2021-12-23 ENCOUNTER — Ambulatory Visit: Payer: Medicare Other | Admitting: Podiatry

## 2021-12-24 ENCOUNTER — Encounter (HOSPITAL_BASED_OUTPATIENT_CLINIC_OR_DEPARTMENT_OTHER): Payer: Self-pay | Admitting: Physical Therapy

## 2021-12-24 ENCOUNTER — Other Ambulatory Visit: Payer: Self-pay

## 2021-12-24 ENCOUNTER — Ambulatory Visit (HOSPITAL_BASED_OUTPATIENT_CLINIC_OR_DEPARTMENT_OTHER): Payer: Medicare Other | Attending: Physical Medicine and Rehabilitation | Admitting: Physical Therapy

## 2021-12-24 DIAGNOSIS — M6281 Muscle weakness (generalized): Secondary | ICD-10-CM | POA: Insufficient documentation

## 2021-12-24 DIAGNOSIS — R262 Difficulty in walking, not elsewhere classified: Secondary | ICD-10-CM | POA: Insufficient documentation

## 2021-12-24 DIAGNOSIS — R293 Abnormal posture: Secondary | ICD-10-CM | POA: Diagnosis not present

## 2021-12-24 DIAGNOSIS — M25552 Pain in left hip: Secondary | ICD-10-CM | POA: Diagnosis not present

## 2021-12-24 NOTE — Therapy (Signed)
[facility] [address] [place], [place], [address] Phone: [phone]   Fax:  [phone]  Physical Therapy Treatment  Patient Details  Name: Carrie Chung MRN: [id number] Date of Birth: 05-26-1941 Referring Provider (PT): Edmonia Lynch, MD   Encounter Date: 12/24/2021   PT End of Session - 12/24/21 1510     Visit Number 25    Number of Visits 33    Date for PT Re-Evaluation 01/01/22    Authorization Type MCR    Progress Note Due on Visit 30    PT Start Time 1430    PT Stop Time 1510    PT Time Calculation (min) 40 min    Activity Tolerance Patient tolerated treatment well    Behavior During Therapy [facility] for tasks assessed/performed             Past Medical History:  Diagnosis Date   Atrial tachycardia ([place])    a. noted at cardiac rehab 5/13; event monitor ordered to assess for AFib   CAD (coronary artery disease)    a. NSTEMI 03/2012 ([place] 03/27/12: pLAD 30%, mLAD 99%, mCFX 95%, mRCA 99% with thrombus, EF 65%);  s/p PTCA/DESx1 to prox-mid RCA 03/27/12 urgently in setting of hypotension/bradycardia, with staged PTCA/Evolve study stent to mid LAD & PTCA/Evolve study stent to prox LCx 03/29/12 ;   echo 03/28/12:EF 60%, Aortic sclerosis without AS, mild RVE, mild reduced RVSF     Endometrial polyp    HTN (hypertension)    Hyperlipidemia    MI, old    Sleep apnea     Past Surgical History:  Procedure Laterality Date   APPENDECTOMY  81 years old   CAROTID STENT  04/2012   x3   CESAREAN SECTION  1984   CHOLECYSTECTOMY  1978   INTRAMEDULLARY (IM) NAIL INTERTROCHANTERIC Left 06/20/2021   Procedure: INTRAMEDULLARY (IM) NAIL INTERTROCHANTRIC;  Surgeon: Renette Butters, MD;  Location: [facility];  Service: Orthopedics;  Laterality: Left;   LEFT HEART CATHETERIZATION WITH CORONARY ANGIOGRAM N/A 03/27/2012   Procedure: LEFT HEART CATHETERIZATION WITH CORONARY ANGIOGRAM;  Surgeon: Burnell Blanks, MD;  Location: [facility] CATH LAB;   Service: Cardiovascular;  Laterality: N/A;   PERCUTANEOUS CORONARY STENT INTERVENTION (PCI-S) N/A 03/27/2012   Procedure: PERCUTANEOUS CORONARY STENT INTERVENTION (PCI-S);  Surgeon: Burnell Blanks, MD;  Location: [facility] CATH LAB;  Service: Cardiovascular;  Laterality: N/A;   PERCUTANEOUS CORONARY STENT INTERVENTION (PCI-S) N/A 03/29/2012   Procedure: PERCUTANEOUS CORONARY STENT INTERVENTION (PCI-S);  Surgeon: [name], MD;  Location: [facility] CATH LAB;  Service: Cardiovascular;  Laterality: N/A;   TONSILLECTOMY  81 years old    There were no vitals filed for this visit.   Subjective Assessment - 12/24/21 1436     Subjective Pt states she got a rebound of COVID after last session. She states that she feels she has lost ground in terms of the hip due to fatigue with COVID. She is walking but not much more than that. She states the hip felt it had been worked after last session but she was able to do more.    How long can you stand comfortably? about 5 min- limited by pain and fatigued around lower back    Currently in Pain? Yes    Pain Score 2     Pain Location Hip    Pain Orientation Left              12/24/2021 TREATMENT:   L hip LAD grade II in  open pack Inf hip mob Mulligan belt grade III   ABD Iso with GTB 5s 10x Bridging  with ABD iso 10x Half turn/roll 10x (pushing off L LE to simulate rolling in bed to R) Standing adductor stretch 30s 2x Table sidestepping 6x  Heel toe rocking 20x       Previous aquatic: Pt seen for aquatic therapy today.  Treatment took place in water 3.25-4.8 ft in depth at the [employer] pool. Temp of water was 91.  Pt entered/exited the pool via stairs step to pattern supervisionwith bilat rail.     Warm up forward, backward and side stepping/walking cues for increased step length, increased speed, hand placement to increase resistance. 4 widths each. 4 widths with hand buoys submerged for core engagement.   Seated Seated cycling 20x  fwd and reto Hip ABD isometric 5s 10x each     Standing Standing adductor and hip flexor stretching 30s 3x each Supported by hand buoys: Add/abd 2x10 Hip flex 2x10 Hip Ext 2 x 10 Deep end traction of L LE- red ankle weight on L ankle, 2x noodle support under armpits for floatation 5 mins (improved gait and reported decreased C-sign pain)   Pt requires buoyancy for support and to offload joints with strengthening exercises. Viscosity of the water is needed for resistance of strengthening; water current perturbations provides challenge to standing balance unsupported, requiring increased core activation                                PT Short Term Goals - 11/04/21 1630       PT SHORT TERM GOAL #1   Title pt will demo hip flexion, abd and extension strength at least 4/5    Baseline see flowsheet      PT SHORT TERM GOAL #2   Title pt will be safe in household ambulation with SPC    Baseline most of the time I use the cane in the house, I just use the walker to carry things    Status Achieved      PT SHORT TERM GOAL #3   Title pt will be able to navigate stairs with use of hand rail and SPC to go upstairs in her home    Status Achieved               PT Long Term Goals - 11/04/21 1631       PT LONG TERM GOAL #1   Title Pt will be able to play with her grandchildren without limitations    Baseline still very limited    Status On-going    [place] Date 01/01/22      PT LONG TERM GOAL #2   Title pt will be able to ambulate for at least 1 hour without AD for functional ambulation    Baseline still using AD consistently,walking about 500 feet from car to pool and I need to sit because of pain and fatigue    Status On-going      PT LONG TERM GOAL #3   Title pt will be able to stand and cook large meal without rest break    Baseline 9/27: pt has made breakfast stanidng, 11/15 I made a soup today but had help    Status On-going      PT LONG TERM GOAL  #4   Title pt will d/c to proper long term program including nustep for cardiac challenges  Status On-going                   Plan - 12/24/21 1503     Clinical Impression Statement Pt with good response to manual therapy as well as gentle glute loading at today's session. Pt able to continue with WB exercise progression without increased pain. Pt still with capsular tightness as well as soft tissue resistriction limiting ROM. Pt did have report of reduced stiffness with pain following session. Plan to continue with glute strengthening and L hip flexibility as tolerated. Pt would benefit from continued skilled therapy in order to reach goals and maximize functional L LE strength and ROM for prevention of future falls.    Stability/Clinical Decision Making Stable/Uncomplicated    Rehab Potential Good    PT Frequency 2x / week    PT Duration 8 weeks    PT Treatment/Interventions ADLs/Self Care Home Management;Aquatic Therapy;Cryotherapy;Electrical Stimulation;Gait training;Ultrasound;Traction;Moist Heat;Stair training;Functional mobility training;Therapeutic activities;Therapeutic exercise;Balance training;Neuromuscular re-education;Manual techniques;Patient/family education;Passive range of motion;Dry needling;Taping    PT Next Visit Plan May benefit from increased land based therapy:  land- gait training, CKC strength and endurance;  pool- hip abductor strength, balance.    PT Home Exercise Plan [email or web address]             Patient will benefit from skilled therapeutic intervention in order to improve the following deficits and impairments:  Abnormal gait, Decreased range of motion, Difficulty walking, Decreased activity tolerance, Pain, Improper body mechanics, Impaired flexibility, Decreased balance, Decreased scar mobility, Decreased strength, Postural dysfunction  Visit Diagnosis: Difficulty in walking, not elsewhere classified  Muscle weakness (generalized)  Pain in left  hip     Problem List Patient Active Problem List   Diagnosis Date Noted   Pain due to onychomycosis of toenails of both feet 08/22/2021   Intertrochanteric fracture of left hip ([place]) 06/24/2021   Closed left hip fracture, initial encounter ([place]) 06/20/2021   Hypokalemia 06/20/2021   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   Cognitive complaints 06/02/2016   Hypoxemic respiratory failure, chronic ([place]) 09/19/2015   MCI (mild cognitive impairment) 03/06/2015   Depression with anxiety 03/06/2015   Bullae 07/02/2014   History of MRSA infection 11/20/2012   Low back pain 07/07/2012   Insomnia 04/28/2012   Coronary Artery Disease 04/19/2012   Hyperlipidemia 04/19/2012   Anxiety 04/19/2012   Non-ST elevation myocardial infarction (NSTEMI), initial care episode ([place]) 03/28/2012   HYPERTENSION, BENIGN 06/12/2010   Dyspnea 06/12/2010   Prolonged QT interval 06/12/2010    Daleen Bo, PT 12/24/2021, 3:18 PM  [facility] [address] [place], [place], [address] Phone: [phone]   Fax:  [phone]  Name: Carrie Chung MRN: [id number] Date of Birth: 08-24-1941

## 2021-12-29 ENCOUNTER — Ambulatory Visit (HOSPITAL_BASED_OUTPATIENT_CLINIC_OR_DEPARTMENT_OTHER): Payer: Medicare Other | Admitting: Physical Therapy

## 2021-12-29 ENCOUNTER — Encounter (HOSPITAL_BASED_OUTPATIENT_CLINIC_OR_DEPARTMENT_OTHER): Payer: Self-pay | Admitting: Physical Therapy

## 2021-12-29 ENCOUNTER — Other Ambulatory Visit: Payer: Self-pay

## 2021-12-29 DIAGNOSIS — R293 Abnormal posture: Secondary | ICD-10-CM

## 2021-12-29 DIAGNOSIS — M6281 Muscle weakness (generalized): Secondary | ICD-10-CM

## 2021-12-29 DIAGNOSIS — R262 Difficulty in walking, not elsewhere classified: Secondary | ICD-10-CM

## 2021-12-29 DIAGNOSIS — M25552 Pain in left hip: Secondary | ICD-10-CM | POA: Diagnosis not present

## 2021-12-29 NOTE — Patient Instructions (Signed)
Access Code: [email or web address] URL: [email or web address] Date: 12/29/2021 Prepared by: Daleen Bo  Exercises Supine Lower Trunk Rotation - 2 x daily - 7 x weekly - 1 sets - 10 reps - 5 hold Standing Hip Flexor Stretch - 2 x daily - 7 x weekly - 1 sets - 15 reps - 5 hold Side Lunge Adductor Stretch - 2 x daily - 7 x weekly - 1 sets - 15 reps - 5 hold Sit to Stand with Arms Crossed - 2 x daily - 7 x weekly - 2 sets - 5 reps

## 2021-12-29 NOTE — Therapy (Signed)
[facility] [address] [place], [place], [address] Phone: [phone]   Fax:  [phone]  Physical Therapy Re-Certification  Patient Details  Name: Carrie Chung MRN: [id number] Date of Birth: 09-27-1941 Referring Provider (PT): Edmonia Lynch, MD   Encounter Date: 12/29/2021   PT End of Session - 12/29/21 1019     Visit Number 26    Number of Visits 40    Date for PT Re-Evaluation 03/29/22    Authorization Type MCR    Progress Note Due on Visit 62    PT Start Time 1016    PT Stop Time 1100    PT Time Calculation (min) 44 min    Activity Tolerance Patient tolerated treatment well    Behavior During Therapy [facility] for tasks assessed/performed             Past Medical History:  Diagnosis Date   Atrial tachycardia ([place])    a. noted at cardiac rehab 5/13; event monitor ordered to assess for AFib   CAD (coronary artery disease)    a. NSTEMI 03/2012 ([place] 03/27/12: pLAD 30%, mLAD 99%, mCFX 95%, mRCA 99% with thrombus, EF 65%);  s/p PTCA/DESx1 to prox-mid RCA 03/27/12 urgently in setting of hypotension/bradycardia, with staged PTCA/Evolve study stent to mid LAD & PTCA/Evolve study stent to prox LCx 03/29/12 ;   echo 03/28/12:EF 60%, Aortic sclerosis without AS, mild RVE, mild reduced RVSF     Endometrial polyp    HTN (hypertension)    Hyperlipidemia    MI, old    Sleep apnea     Past Surgical History:  Procedure Laterality Date   APPENDECTOMY  81 years old   CAROTID STENT  04/2012   x3   CESAREAN SECTION  1984   CHOLECYSTECTOMY  1978   INTRAMEDULLARY (IM) NAIL INTERTROCHANTERIC Left 06/20/2021   Procedure: INTRAMEDULLARY (IM) NAIL INTERTROCHANTRIC;  Surgeon: Renette Butters, MD;  Location: [facility];  Service: Orthopedics;  Laterality: Left;   LEFT HEART CATHETERIZATION WITH CORONARY ANGIOGRAM N/A 03/27/2012   Procedure: LEFT HEART CATHETERIZATION WITH CORONARY ANGIOGRAM;  Surgeon: Burnell Blanks, MD;  Location: [facility] CATH  LAB;  Service: Cardiovascular;  Laterality: N/A;   PERCUTANEOUS CORONARY STENT INTERVENTION (PCI-S) N/A 03/27/2012   Procedure: PERCUTANEOUS CORONARY STENT INTERVENTION (PCI-S);  Surgeon: Burnell Blanks, MD;  Location: [facility] CATH LAB;  Service: Cardiovascular;  Laterality: N/A;   PERCUTANEOUS CORONARY STENT INTERVENTION (PCI-S) N/A 03/29/2012   Procedure: PERCUTANEOUS CORONARY STENT INTERVENTION (PCI-S);  Surgeon: [name], MD;  Location: [facility] CATH LAB;  Service: Cardiovascular;  Laterality: N/A;   TONSILLECTOMY  81 years old    There were no vitals filed for this visit.   Subjective Assessment - 12/29/21 1018     Subjective Pt states she had some pain when she got home. She feels the pain is about the 3 in the lateral aspect of the hip. She will have some buttock pain at night as well.    How long can you stand comfortably? about 5 min- limited by pain and fatigued around lower back    Currently in Pain? Yes    Pain Score 3     Pain Orientation Left    Pain Descriptors / Indicators Aching    Pain Type Surgical pain    Pain Onset More than a month ago                [facility] PT Assessment - 12/29/21 0001  Assessment   Medical Diagnosis s/p IM Nail Lt femur, closed reduction and manipultion of Lt hip fx    Referring Provider (PT) Edmonia Lynch, MD    Onset Date/Surgical Date 06/20/21    Prior Therapy inpatient and home health      Precautions   Precautions Fall      Balance Screen   Has the patient fallen in the past 6 months No      Prior Function   Vocation Requirements play with grand children, cooking      AROM   Overall AROM Comments flex: 100, ABD 5, IR 10, ER 10, ext 0      PROM   Overall PROM Comments flex 110, ER 15, IR 20, ABD 20, ext 5      Strength   Overall Strength Comments 4-/5 throughout L hip with pain into flexion, ABD      Transfers   Five time sit to stand comments  45s   no UE, offweight of L LE     Ambulation/Gait   Gait Comments  antalgic, decreased L LE stance time, decreased hip extension   no cane needed for level surface in clinic              1//2023 TREATMENT:   L hip LAD grade II in open pack Inf hip mob Mulligan belt grade III   Manual stretching into flexion and ABD- to tolerance  Half turn/roll 10x (pushing off L LE to simulate rolling in bed to R) Standing adductor stretch 5s 15x Standing hip flexor stretch 15s 5x STS from chair 2x5 (reviewed safety for home)       PT Education - 12/29/21 1022     Education Details exam findings, exercise progression, DOMS expectations, muscle firing    Person(s) Educated Patient    Methods Explanation;Demonstration    Comprehension Verbalized understanding;Returned demonstration              PT Short Term Goals - 12/29/21 1126       PT SHORT TERM GOAL #1   Title pt will demo hip flexion, abd and extension strength at least 4/5    Baseline see flowsheet    Status Partially Met      PT SHORT TERM GOAL #2   Title pt will be safe in household ambulation with SPC    Baseline most of the time I use the cane in the house, I just use the walker to carry things    Status Achieved      PT SHORT TERM GOAL #3   Title pt will be able to navigate stairs with use of hand rail and SPC to go upstairs in her home    Status Achieved               PT Long Term Goals - 12/29/21 1127       PT LONG TERM GOAL #1   Title Pt will be able to play with her grandchildren without limitations    Status Partially Met      PT LONG TERM GOAL #2   Title pt will be able to ambulate for at least 1 hour without AD for functional ambulation    Baseline still using AD consistently,walking about 500 feet from car to pool and I need to sit because of pain and fatigue    Status Partially Met      PT LONG TERM GOAL #3   Title pt will be able to stand and  cook large meal without rest break    Baseline 9/27: pt has made breakfast stanidng, 11/15 I made a soup today  but had help    Status Partially Met      PT LONG TERM GOAL #4   Title pt will d/c to proper long term program including nustep for cardiac challenges    Status On-going                   Plan - 12/29/21 1022     Clinical Impression Statement Pt with improved mobility and ROM as compared to previous assessments. Pt's objective improvementsin ROM and strength translate to improved mobility and gait. However, pt is still significantly limited by L hip joint stiffness as well as postero-lateral hip soft tissue restrictions. Pt's progress and participation was limited by COVID-19 infection which stalled strength and ROM improvements. Pt is progressing with PT again since restarting visits. Pt would benefit from continued skilled therapy in order to reach goals and maximize functional L LE strength and ROM for prevention of future falls.    Stability/Clinical Decision Making Stable/Uncomplicated    Rehab Potential Good    PT Frequency 2x / week    PT Duration 8 weeks    PT Treatment/Interventions ADLs/Self Care Home Management;Aquatic Therapy;Cryotherapy;Electrical Stimulation;Gait training;Ultrasound;Traction;Moist Heat;Stair training;Functional mobility training;Therapeutic activities;Therapeutic exercise;Balance training;Neuromuscular re-education;Manual techniques;Patient/family education;Passive range of motion;Dry needling;Taping    PT Next Visit Plan May benefit from increased land based therapy:  land- gait training, CKC strength and endurance;  pool- hip abductor strength, balance.    PT Home Exercise Plan [email or web address]    Consulted and Agree with Plan of Care Patient             Patient will benefit from skilled therapeutic intervention in order to improve the following deficits and impairments:  Abnormal gait, Decreased range of motion, Difficulty walking, Decreased activity tolerance, Pain, Improper body mechanics, Impaired flexibility, Decreased balance, Decreased scar  mobility, Decreased strength, Postural dysfunction  Visit Diagnosis: Difficulty in walking, not elsewhere classified - Plan: PT plan of care cert/re-cert  Muscle weakness (generalized) - Plan: PT plan of care cert/re-cert  Pain in left hip - Plan: PT plan of care cert/re-cert  Abnormal posture - Plan: PT plan of care cert/re-cert     Problem List Patient Active Problem List   Diagnosis Date Noted   Pain due to onychomycosis of toenails of both feet 08/22/2021   Intertrochanteric fracture of left hip ([place]) 06/24/2021   Closed left hip fracture, initial encounter ([place]) 06/20/2021   Hypokalemia 06/20/2021   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   Cognitive complaints 06/02/2016   Hypoxemic respiratory failure, chronic ([place]) 09/19/2015   MCI (mild cognitive impairment) 03/06/2015   Depression with anxiety 03/06/2015   Bullae 07/02/2014   History of MRSA infection 11/20/2012   Low back pain 07/07/2012   Insomnia 04/28/2012   Coronary Artery Disease 04/19/2012   Hyperlipidemia 04/19/2012   Anxiety 04/19/2012   Non-ST elevation myocardial infarction (NSTEMI), initial care episode ([place]) 03/28/2012   HYPERTENSION, BENIGN 06/12/2010   Dyspnea 06/12/2010   Prolonged QT interval 06/12/2010    Daleen Bo, PT 12/29/2021, 12:46 PM  [facility] [address] [place], [place], [address] Phone: [phone]   Fax:  [phone]  Name: Carrie Chung MRN: [id number] Date of Birth: 1941-02-22

## 2021-12-31 ENCOUNTER — Encounter (HOSPITAL_BASED_OUTPATIENT_CLINIC_OR_DEPARTMENT_OTHER): Payer: Self-pay | Admitting: Physical Therapy

## 2021-12-31 ENCOUNTER — Other Ambulatory Visit: Payer: Self-pay

## 2021-12-31 ENCOUNTER — Ambulatory Visit (HOSPITAL_BASED_OUTPATIENT_CLINIC_OR_DEPARTMENT_OTHER): Payer: Medicare Other | Admitting: Physical Therapy

## 2021-12-31 DIAGNOSIS — M25552 Pain in left hip: Secondary | ICD-10-CM

## 2021-12-31 DIAGNOSIS — R262 Difficulty in walking, not elsewhere classified: Secondary | ICD-10-CM

## 2021-12-31 DIAGNOSIS — R293 Abnormal posture: Secondary | ICD-10-CM | POA: Diagnosis not present

## 2021-12-31 DIAGNOSIS — M6281 Muscle weakness (generalized): Secondary | ICD-10-CM | POA: Diagnosis not present

## 2021-12-31 NOTE — Therapy (Signed)
[place] °[facility] °[address] °[place], [place], [address] °Phone: [phone]   Fax:  [phone] ° °Physical Therapy Treatment ° °Patient Details  °Name: Carrie Chung °MRN: [id number] °Date of Birth: 09/10/1941 °Referring Provider (PT): Timothy Murphy, MD ° ° °Encounter Date: 12/31/2021 ° ° PT End of Session - 12/31/21 1044   ° ° Visit Number 27   ° Number of Visits 40   ° Date for PT Re-Evaluation 03/29/22   ° Authorization Type MCR   ° Progress Note Due on Visit 30   ° PT Start Time 1015   ° PT Stop Time 1100   ° PT Time Calculation (min) 45 min   ° Activity Tolerance Patient tolerated treatment well   ° Behavior During Therapy WFL for tasks assessed/performed   ° °  °  ° °  ° ° ° °Past Medical History:  °Diagnosis Date  ° Atrial tachycardia (HCC)   ° a. noted at cardiac rehab 5/13; event monitor ordered to assess for AFib  ° CAD (coronary artery disease)   ° a. NSTEMI 03/2012 ([facility] 03/27/12: pLAD 30%, mLAD 99%, mCFX 95%, mRCA 99% with thrombus, EF 65%);  s/p PTCA/DESx1 to prox-mid RCA 03/27/12 urgently in setting of hypotension/bradycardia, with staged PTCA/Evolve study stent to mid LAD & PTCA/Evolve study stent to prox LCx 03/29/12 ;   echo 03/28/12:EF 60%, Aortic sclerosis without AS, mild RVE, mild reduced RVSF    ° Endometrial polyp   ° HTN (hypertension)   ° Hyperlipidemia   ° MI, old   ° Sleep apnea   ° ° °Past Surgical History:  °Procedure Laterality Date  ° APPENDECTOMY  81 years old  ° CAROTID STENT  04/2012  ° x3  ° CESAREAN SECTION  1984  ° CHOLECYSTECTOMY  1978  ° INTRAMEDULLARY (IM) NAIL INTERTROCHANTERIC Left 06/20/2021  ° Procedure: INTRAMEDULLARY (IM) NAIL INTERTROCHANTRIC;  Surgeon: Murphy, Timothy D, MD;  Location: [facility] OR;  Service: Orthopedics;  Laterality: Left;  ° LEFT HEART CATHETERIZATION WITH CORONARY ANGIOGRAM N/A 03/27/2012  ° Procedure: LEFT HEART CATHETERIZATION WITH CORONARY ANGIOGRAM;  Surgeon: Christopher D McAlhany, MD;  Location: [facility] CATH LAB;   Service: Cardiovascular;  Laterality: N/A;  ° PERCUTANEOUS CORONARY STENT INTERVENTION (PCI-S) N/A 03/27/2012  ° Procedure: PERCUTANEOUS CORONARY STENT INTERVENTION (PCI-S);  Surgeon: Christopher D McAlhany, MD;  Location: [facility] CATH [facility];  Service: Cardiovascular;  Laterality: N/A;  ° PERCUTANEOUS CORONARY STENT INTERVENTION (PCI-S) N/A 03/29/2012  ° Procedure: PERCUTANEOUS CORONARY STENT INTERVENTION (PCI-S);  Surgeon: Michael Cooper, MD;  Location: [facility] CATH [facility];  Service: Cardiovascular;  Laterality: N/A;  ° TONSILLECTOMY  81 years old  ° ° °There were no vitals filed for this visit. ° ° Subjective Assessment - 12/31/21 1022   ° ° Subjective Pt states her hip is sore today from the past session. She has been compliant with HEP.   ° How long can you stand comfortably? about 5 min- limited by pain and fatigued around lower back   ° Currently in Pain? Yes   ° Pain Score 3    ° Pain Location Hip   ° Pain Orientation Left   ° Pain Descriptors / Indicators Sore   ° Pain Onset More than a month ago   ° °  °  ° °  ° ° ° ° ° ° ° ° °  °12/31/2021 TREATMENT: °  °L hip ext mobilization grade III °Inf and lateral hip mob Mulligan belt grade III °  °Manual stretching into flexion   and ABD- to tolerance  S/L clam 10x Standing march 10x Standing adductor stretch 5s 15x Standing hip flexor stretch 15s 5x STS from chair 2x5 (reviewed safety for home)          PT Short Term Goals - 12/29/21 1126       PT SHORT TERM GOAL #1   Title pt will demo hip flexion, abd and extension strength at least 4/5    Baseline see flowsheet    Status Partially Met      PT SHORT TERM GOAL #2   Title pt will be safe in household ambulation with SPC    Baseline most of the time I use the cane in the house, I just use the walker to carry things    Status Achieved      PT SHORT TERM GOAL #3   Title pt will be able to navigate stairs with use of hand rail and SPC to go upstairs in her home    Status Achieved               PT Long  Term Goals - 12/29/21 1127       PT LONG TERM GOAL #1   Title Pt will be able to play with her grandchildren without limitations    Status Partially Met      PT LONG TERM GOAL #2   Title pt will be able to ambulate for at least 1 hour without AD for functional ambulation    Baseline still using AD consistently,walking about 500 feet from car to pool and I need to sit because of pain and fatigue    Status Partially Met      PT LONG TERM GOAL #3   Title pt will be able to stand and cook large meal without rest break    Baseline 9/27: pt has made breakfast stanidng, 11/15 I made a soup today but had help    Status Partially Met      PT LONG TERM GOAL #4   Title pt will d/c to proper long term program including nustep for cardiac challenges    Status On-going                   Plan - 12/31/21 1043     Clinical Impression Statement Pt presents with expected DOMS at today's session in quadriceps, likely due to increased loaded with STS. Pt able to improve WB as well as direct postero-lateral strengthening without increased pain. Pt L hip pain is improving with report of no pain at beginning of session. Pt with report of improved stiffness during gait at end of sesison. Pt advised to do strengthening every other day at htis time given increase in soreness. Plan to progress hip ABD ROM as well as SL standing tolerance as able in order to progress away from AD. Pt would benefit from continued skilled therapy in order to reach goals and maximize functional L LE strength and ROM for prevention of future falls.    Stability/Clinical Decision Making Stable/Uncomplicated    Rehab Potential Good    PT Frequency 2x / week    PT Duration 8 weeks    PT Treatment/Interventions ADLs/Self Care Home Management;Aquatic Therapy;Cryotherapy;Electrical Stimulation;Gait training;Ultrasound;Traction;Moist Heat;Stair training;Functional mobility training;Therapeutic activities;Therapeutic exercise;Balance  training;Neuromuscular re-education;Manual techniques;Patient/family education;Passive range of motion;Dry needling;Taping    PT Next Visit Plan May benefit from increased land based therapy:  land- gait training, CKC strength and endurance;  pool- hip abductor strength, balance.  PT Home Exercise Plan [id number]   ° Consulted and Agree with Plan of Care Patient   ° °  °  ° °  ° ° ° °Patient will benefit from skilled therapeutic intervention in order to improve the following deficits and impairments:  Abnormal gait, Decreased range of motion, Difficulty walking, Decreased activity tolerance, Pain, Improper body mechanics, Impaired flexibility, Decreased balance, Decreased scar mobility, Decreased strength, Postural dysfunction ° °Visit Diagnosis: °Difficulty in walking, not elsewhere classified ° °Muscle weakness (generalized) ° °Pain in left hip ° ° ° ° °Problem List °Patient Active Problem List  ° Diagnosis Date Noted  ° Pain due to onychomycosis of toenails of both feet 08/22/2021  ° Intertrochanteric fracture of left hip (HCC) 06/24/2021  ° Closed left hip fracture, initial encounter (HCC) 06/20/2021  ° Hypokalemia 06/20/2021  ° 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  ° Cognitive complaints 06/02/2016  ° Hypoxemic respiratory failure, chronic (HCC) 09/19/2015  ° MCI (mild cognitive impairment) 03/06/2015  ° Depression with anxiety 03/06/2015  ° Bullae 07/02/2014  ° History of MRSA infection 11/20/2012  ° Low back pain 07/07/2012  ° Insomnia 04/28/2012  ° Coronary Artery Disease 04/19/2012  ° Hyperlipidemia 04/19/2012  ° Anxiety 04/19/2012  ° Non-ST elevation myocardial infarction (NSTEMI), initial care episode (HCC) 03/28/2012  ° HYPERTENSION, BENIGN 06/12/2010  ° Dyspnea 06/12/2010  ° Prolonged QT interval 06/12/2010  ° ° ° , PT °12/31/2021, 11:01 AM ° °[place] °[facility] °[address] °[place], [place], [address] °Phone: [phone]   Fax:  [phone] ° °Name: Marleni  C Vivian °MRN: [id number] °Date of Birth: 08/11/1941 ° ° ° °

## 2022-01-05 ENCOUNTER — Ambulatory Visit (HOSPITAL_BASED_OUTPATIENT_CLINIC_OR_DEPARTMENT_OTHER): Payer: Medicare Other | Admitting: Physical Therapy

## 2022-01-05 ENCOUNTER — Other Ambulatory Visit: Payer: Self-pay

## 2022-01-05 DIAGNOSIS — M25552 Pain in left hip: Secondary | ICD-10-CM

## 2022-01-05 DIAGNOSIS — R293 Abnormal posture: Secondary | ICD-10-CM | POA: Diagnosis not present

## 2022-01-05 DIAGNOSIS — M6281 Muscle weakness (generalized): Secondary | ICD-10-CM | POA: Diagnosis not present

## 2022-01-05 DIAGNOSIS — R262 Difficulty in walking, not elsewhere classified: Secondary | ICD-10-CM | POA: Diagnosis not present

## 2022-01-06 ENCOUNTER — Other Ambulatory Visit: Payer: Medicare Other | Admitting: Internal Medicine

## 2022-01-06 ENCOUNTER — Encounter (HOSPITAL_BASED_OUTPATIENT_CLINIC_OR_DEPARTMENT_OTHER): Payer: Self-pay | Admitting: Physical Therapy

## 2022-01-06 DIAGNOSIS — I10 Essential (primary) hypertension: Secondary | ICD-10-CM

## 2022-01-06 DIAGNOSIS — E611 Iron deficiency: Secondary | ICD-10-CM

## 2022-01-06 NOTE — Therapy (Signed)
[place] [address] [place], [place], [address] Phone: [phone]   Fax:  [phone]  Physical Therapy Treatment  Patient Details  Name: Carrie Chung MRN: [id number] Date of Birth: Oct 22, 1941 Referring Provider (PT): Edmonia Lynch, MD   Encounter Date: 01/05/2022   PT End of Session - 01/05/22 1619     Visit Number 28    Number of Visits 40    Date for PT Re-Evaluation 03/29/22    Authorization Type MCR    Progress Note Due on Visit 54    PT Start Time 1613    PT Stop Time 2993    PT Time Calculation (min) 45 min    Activity Tolerance Patient tolerated treatment well    Behavior During Therapy [facility] for tasks assessed/performed             Past Medical History:  Diagnosis Date   Atrial tachycardia ([place])    a. noted at cardiac rehab 5/13; event monitor ordered to assess for AFib   CAD (coronary artery disease)    a. NSTEMI 03/2012 ([place] 03/27/12: pLAD 30%, mLAD 99%, mCFX 95%, mRCA 99% with thrombus, EF 65%);  s/p PTCA/DESx1 to prox-mid RCA 03/27/12 urgently in setting of hypotension/bradycardia, with staged PTCA/Evolve study stent to mid LAD & PTCA/Evolve study stent to prox LCx 03/29/12 ;   echo 03/28/12:EF 60%, Aortic sclerosis without AS, mild RVE, mild reduced RVSF     Endometrial polyp    HTN (hypertension)    Hyperlipidemia    MI, old    Sleep apnea     Past Surgical History:  Procedure Laterality Date   APPENDECTOMY  81 years old   CAROTID STENT  04/2012   x3   CESAREAN SECTION  1984   CHOLECYSTECTOMY  1978   INTRAMEDULLARY (IM) NAIL INTERTROCHANTERIC Left 06/20/2021   Procedure: INTRAMEDULLARY (IM) NAIL INTERTROCHANTRIC;  Surgeon: Renette Butters, MD;  Location: [place];  Service: Orthopedics;  Laterality: Left;   LEFT HEART CATHETERIZATION WITH CORONARY ANGIOGRAM N/A 03/27/2012   Procedure: LEFT HEART CATHETERIZATION WITH CORONARY ANGIOGRAM;  Surgeon: Burnell Blanks, MD;  Location: [facility] CATH LAB;   Service: Cardiovascular;  Laterality: N/A;   PERCUTANEOUS CORONARY STENT INTERVENTION (PCI-S) N/A 03/27/2012   Procedure: PERCUTANEOUS CORONARY STENT INTERVENTION (PCI-S);  Surgeon: Burnell Blanks, MD;  Location: [facility] CATH LAB;  Service: Cardiovascular;  Laterality: N/A;   PERCUTANEOUS CORONARY STENT INTERVENTION (PCI-S) N/A 03/29/2012   Procedure: PERCUTANEOUS CORONARY STENT INTERVENTION (PCI-S);  Surgeon: [name], MD;  Location: [facility] CATH LAB;  Service: Cardiovascular;  Laterality: N/A;   TONSILLECTOMY  81 years old    There were no vitals filed for this visit.   Subjective Assessment - 01/05/22 1620     Subjective pt reports she had increased pain after prior Rx.   pt states she forgets her cane and walks more without her cane.  pt is able to stand up longer to cook.  She reports improved rolling over in bed.  pt has improved tolerance with playing with grandchildren.   Pt able to shower independently.  Pt reports improved sx's with the heel lift.    Pain Score 3     Pain Location --   anterior hip, groin, quads, lateral hip, and lateral thigh                              [facility] Adult PT Treatment/Exercise - 01/06/22 0001  Exercises   Exercises Knee/Hip   Reviewed current function, response to prior Rx, pain level, and HEP compliance.     Knee/Hip Exercises: Standing   Other Standing Knee Exercises sidestepping with bilat UE's on table, marching x 10 reps, heel raises 2x10 reps, sit to stands from chair 2x5 reps, standing hip flexor stretch 4x15 sec, standing L flexion SLR x 10 reps, standing lateral taps with L LE 2x10 with bilat UE assist, ABD Iso with GTB 5s 10x,  Bridging  with ABD iso 10x    Other Standing Knee Exercises Pt received L hip flexion PROM in supine per pt tolerance                     PT Education - 01/06/22 1516     Education Details exercise form    Person(s) Educated Patient    Methods Explanation;Demonstration;Verbal  cues;Tactile cues    Comprehension Verbalized understanding;Returned demonstration;Verbal cues required;Tactile cues required              PT Short Term Goals - 12/29/21 1126       PT SHORT TERM GOAL #1   Title pt will demo hip flexion, abd and extension strength at least 4/5    Baseline see flowsheet    Status Partially Met      PT SHORT TERM GOAL #2   Title pt will be safe in household ambulation with SPC    Baseline most of the time I use the cane in the house, I just use the walker to carry things    Status Achieved      PT SHORT TERM GOAL #3   Title pt will be able to navigate stairs with use of hand rail and SPC to go upstairs in her home    Status Achieved               PT Long Term Goals - 12/29/21 1127       PT LONG TERM GOAL #1   Title Pt will be able to play with her grandchildren without limitations    Status Partially Met      PT LONG TERM GOAL #2   Title pt will be able to ambulate for at least 1 hour without AD for functional ambulation    Baseline still using AD consistently,walking about 500 feet from car to pool and I need to sit because of pain and fatigue    Status Partially Met      PT LONG TERM GOAL #3   Title pt will be able to stand and cook large meal without rest break    Baseline 9/27: pt has made breakfast stanidng, 11/15 I made a soup today but had help    Status Partially Met      PT LONG TERM GOAL #4   Title pt will d/c to proper long term program including nustep for cardiac challenges    Status On-going                   Plan - 01/06/22 1007     Clinical Impression Statement Pt is improving with function as evidenced by subjective reports of ambulating more without her cane, sttanding longer to cook, improved bed mobility, and improved tolerance playing with grandchildren.  Pt has weakness in L hip and LE.  Pt is making slow progress with tolerance to land and WB'ing exercises.  She performed exercises well with cuing  and instruction in correct form.  Pt responded well to Rx reporting improved pain from 3/10 before Rx to 0-1/10 after Rx.    PT Treatment/Interventions ADLs/Self Care Home Management;Aquatic Therapy;Cryotherapy;Electrical Stimulation;Gait training;Ultrasound;Traction;Moist Heat;Stair training;Functional mobility training;Therapeutic activities;Therapeutic exercise;Balance training;Neuromuscular re-education;Manual techniques;Patient/family education;Passive range of motion;Dry needling;Taping    PT Next Visit Plan May benefit from increased land based therapy:  land- gait training, CKC strength and endurance;  pool- hip abductor strength, balance.    PT Home Exercise Plan [id number]    Consulted and Agree with Plan of Care Patient             Patient will benefit from skilled therapeutic intervention in order to improve the following deficits and impairments:  Abnormal gait, Decreased range of motion, Difficulty walking, Decreased activity tolerance, Pain, Improper body mechanics, Impaired flexibility, Decreased balance, Decreased scar mobility, Decreased strength, Postural dysfunction  Visit Diagnosis: Difficulty in walking, not elsewhere classified  Muscle weakness (generalized)  Pain in left hip     Problem List Patient Active Problem List   Diagnosis Date Noted   Pain due to onychomycosis of toenails of both feet 08/22/2021   Intertrochanteric fracture of left hip ([place]) 06/24/2021   Closed left hip fracture, initial encounter ([place]) 06/20/2021   Hypokalemia 06/20/2021   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   Cognitive complaints 06/02/2016   Hypoxemic respiratory failure, chronic ([place]) 09/19/2015   MCI (mild cognitive impairment) 03/06/2015   Depression with anxiety 03/06/2015   Bullae 07/02/2014   History of MRSA infection 11/20/2012   Low back pain 07/07/2012   Insomnia 04/28/2012   Coronary Artery Disease 04/19/2012   Hyperlipidemia 04/19/2012   Anxiety 04/19/2012   Non-ST  elevation myocardial infarction (NSTEMI), initial care episode ([place]) 03/28/2012   HYPERTENSION, BENIGN 06/12/2010   Dyspnea 06/12/2010   Prolonged QT interval 06/12/2010   Carrie Chung PT, DPT 01/06/22 3:17 PM   [facility] [address] [place], [place], [address] Phone: [phone]   Fax:  [phone]  Name: Carrie Chung MRN: [id number] Date of Birth: 1941/08/29

## 2022-01-07 ENCOUNTER — Ambulatory Visit (HOSPITAL_BASED_OUTPATIENT_CLINIC_OR_DEPARTMENT_OTHER): Payer: Medicare Other | Admitting: Physical Therapy

## 2022-01-07 LAB — IRON,TIBC AND FERRITIN PANEL
%SAT: 23 % (calc) (ref 16–45)
Ferritin: 10 ng/mL — ABNORMAL LOW (ref 16–288)
Iron: 98 ug/dL (ref 45–160)
TIBC: 418 mcg/dL (calc) (ref 250–450)

## 2022-01-07 LAB — CBC WITH DIFFERENTIAL/PLATELET
Absolute Monocytes: 323 cells/uL (ref 200–950)
Basophils Absolute: 20 cells/uL (ref 0–200)
Basophils Relative: 0.4 %
Eosinophils Absolute: 93 cells/uL (ref 15–500)
Eosinophils Relative: 1.9 %
HCT: 40.4 % (ref 35.0–45.0)
Hemoglobin: 13.1 g/dL (ref 11.7–15.5)
Lymphs Abs: 1333 cells/uL (ref 850–3900)
MCH: 28.2 pg (ref 27.0–33.0)
MCHC: 32.4 g/dL (ref 32.0–36.0)
MCV: 87.1 fL (ref 80.0–100.0)
MPV: 9.9 fL (ref 7.5–12.5)
Monocytes Relative: 6.6 %
Neutro Abs: 3131 cells/uL (ref 1500–7800)
Neutrophils Relative %: 63.9 %
Platelets: 179 10*3/uL (ref 140–400)
RBC: 4.64 10*6/uL (ref 3.80–5.10)
RDW: 13.1 % (ref 11.0–15.0)
Total Lymphocyte: 27.2 %
WBC: 4.9 10*3/uL (ref 3.8–10.8)

## 2022-01-08 ENCOUNTER — Ambulatory Visit: Payer: Medicare Other | Admitting: Internal Medicine

## 2022-01-09 ENCOUNTER — Ambulatory Visit (INDEPENDENT_AMBULATORY_CARE_PROVIDER_SITE_OTHER): Payer: Medicare Other | Admitting: Internal Medicine

## 2022-01-09 ENCOUNTER — Encounter: Payer: Self-pay | Admitting: Internal Medicine

## 2022-01-09 ENCOUNTER — Other Ambulatory Visit: Payer: Self-pay

## 2022-01-09 VITALS — BP 120/82 | HR 69 | Temp 98.1°F

## 2022-01-09 DIAGNOSIS — G4709 Other insomnia: Secondary | ICD-10-CM | POA: Diagnosis not present

## 2022-01-09 DIAGNOSIS — S72002D Fracture of unspecified part of neck of left femur, subsequent encounter for closed fracture with routine healing: Secondary | ICD-10-CM

## 2022-01-09 DIAGNOSIS — F439 Reaction to severe stress, unspecified: Secondary | ICD-10-CM | POA: Diagnosis not present

## 2022-01-09 DIAGNOSIS — F32A Depression, unspecified: Secondary | ICD-10-CM

## 2022-01-09 DIAGNOSIS — I252 Old myocardial infarction: Secondary | ICD-10-CM

## 2022-01-09 DIAGNOSIS — F419 Anxiety disorder, unspecified: Secondary | ICD-10-CM

## 2022-01-09 DIAGNOSIS — Z955 Presence of coronary angioplasty implant and graft: Secondary | ICD-10-CM | POA: Diagnosis not present

## 2022-01-09 DIAGNOSIS — I10 Essential (primary) hypertension: Secondary | ICD-10-CM

## 2022-01-09 MED ORDER — ALPRAZOLAM 2 MG PO TABS: 2.0000 mg | ORAL_TABLET | Freq: Every evening | ORAL | 2 refills | Status: DC | PRN

## 2022-01-09 NOTE — Progress Notes (Signed)
° °  Subjective:    Patient ID: Carrie Chung, female    DOB: 1941/03/22, 81 y.o.   MRN: [id number]  HPI 81 year old Female seen for follow up on several issues.  She continues to see Dennison Nancy for counseling.  Has situational stress with husband who has some memory issues which were discussed today.  She has longstanding history of insomnia for many years.  Asking for Xanax today to help her with sleep.  Prescribe Xanax 2 mg 1 tablet at bedtime as needed.  She says it will not over sedate her to where she cannot wake up to hear her husband if he needs her.  She continues in outpatient rehab physical therapy  She sees Dr. Elsworth Soho for use of nocturnal oxygen.  Found to have oxygen desaturation in November 2020 to less than 88% for 1 hour 45 minutes during sleep.  Normal PFTs in 2016.  Has not been found to have significant sleep apnea previously so Dr. Earvin Hansen has elected not to pursue sleep study.  She has mild emphysema as she is a former smoker but no airway obstruction on PFTs.  Has about a 10-pack-year history of smoking and quit in 1982 according to Dr. Bari Mantis records    Review of Systems see above     Objective:   Physical Exam Blood pressure 120/82 pulse 69 temperature 98.1 degrees pulse oximetry 98% Chest is clear to auscultation.  Cardiac exam: Regular rate and rhythm.      Assessment & Plan:  Situational stress-continues in counseling with Dennison Nancy  History of oxygen desaturation requiring nighttime oxygen supplementation.-Not sleep apnea evaluated by Dr. Karie Fetch prescribed Xanax for sleep as requested by patient  History of hip fracture-continues in physical therapy  Hypertension-stable on current regimen  Hyperlipidemia treated with Crestor  GE reflux treated with Nexium  History of coronary disease status post PTCA with drug-eluting stent to the mid RCA in 2013.  Subsequently had stents placed in the mid LAD and proximal circumflex arteries.  Attends  cardiac rehab.  Chronic anticoagulation with Plavix after left comminuted impacted intertrochanteric fracture  Today I spent 30 minutes speaking with patient about her concerns and issues.  She will see her cardiologist, Dr. Burt Knack in May.  She has appointment here for Medicare wellness visit in September 2023.

## 2022-01-09 NOTE — Patient Instructions (Addendum)
Medicare wellness visit due in September. Increase Xanax to 2 mg at bedtime but be careful getting out of bed. Iron deficiency has improved. Please take MVI with iron daily. You may stop iron supplement since Hgb is improved and iron supplement disagrees with you. Medicare wellness visit due in September.

## 2022-01-13 ENCOUNTER — Encounter (HOSPITAL_BASED_OUTPATIENT_CLINIC_OR_DEPARTMENT_OTHER): Payer: Self-pay | Admitting: Physical Therapy

## 2022-01-13 ENCOUNTER — Ambulatory Visit (HOSPITAL_BASED_OUTPATIENT_CLINIC_OR_DEPARTMENT_OTHER): Payer: Medicare Other | Admitting: Physical Therapy

## 2022-01-13 ENCOUNTER — Other Ambulatory Visit: Payer: Self-pay

## 2022-01-13 DIAGNOSIS — R293 Abnormal posture: Secondary | ICD-10-CM | POA: Diagnosis not present

## 2022-01-13 DIAGNOSIS — R262 Difficulty in walking, not elsewhere classified: Secondary | ICD-10-CM | POA: Diagnosis not present

## 2022-01-13 DIAGNOSIS — M6281 Muscle weakness (generalized): Secondary | ICD-10-CM

## 2022-01-13 DIAGNOSIS — M25552 Pain in left hip: Secondary | ICD-10-CM

## 2022-01-13 NOTE — Therapy (Signed)
[place], [place], [address] Phone: [phone]   Fax:  [phone]  Physical Therapy Treatment  Patient Details  Name: Carrie Chung MRN: [id number] Date of Birth: 1941/10/29 Referring Provider (PT): Edmonia Lynch, MD   Encounter Date: 01/13/2022   PT End of Session - 01/13/22 1203     Visit Number 29    Number of Visits 40    Date for PT Re-Evaluation 03/29/22    Authorization Type MCR    Progress Note Due on Visit 64    PT Start Time 1107    PT Stop Time 1152    PT Time Calculation (min) 45 min    Activity Tolerance Patient tolerated treatment well    Behavior During Therapy [facility] for tasks assessed/performed             Past Medical History:  Diagnosis Date   Atrial tachycardia ([place])    a. noted at cardiac rehab 5/13; event monitor ordered to assess for AFib   CAD (coronary artery disease)    a. NSTEMI 03/2012 ([place] 03/27/12: pLAD 30%, mLAD 99%, mCFX 95%, mRCA 99% with thrombus, EF 65%);  s/p PTCA/DESx1 to prox-mid RCA 03/27/12 urgently in setting of hypotension/bradycardia, with staged PTCA/Evolve study stent to mid LAD & PTCA/Evolve study stent to prox LCx 03/29/12 ;   echo 03/28/12:EF 60%, Aortic sclerosis without AS, mild RVE, mild reduced RVSF     Endometrial polyp    HTN (hypertension)    Hyperlipidemia    MI, old    Sleep related hypoxia     Past Surgical History:  Procedure Laterality Date   APPENDECTOMY  81 years old   CAROTID STENT  04/2012   x3   CESAREAN SECTION  1984   CHOLECYSTECTOMY  1978   INTRAMEDULLARY (IM) NAIL INTERTROCHANTERIC Left 06/20/2021   Procedure: INTRAMEDULLARY (IM) NAIL INTERTROCHANTRIC;  Surgeon: Renette Butters, MD;  Location: [facility];  Service: Orthopedics;  Laterality: Left;   LEFT HEART CATHETERIZATION WITH CORONARY ANGIOGRAM N/A 03/27/2012   Procedure: LEFT HEART CATHETERIZATION WITH CORONARY ANGIOGRAM;  Surgeon: Burnell Blanks, MD;  Location: [facility]  CATH LAB;  Service: Cardiovascular;  Laterality: N/A;   PERCUTANEOUS CORONARY STENT INTERVENTION (PCI-S) N/A 03/27/2012   Procedure: PERCUTANEOUS CORONARY STENT INTERVENTION (PCI-S);  Surgeon: Burnell Blanks, MD;  Location: [facility] CATH LAB;  Service: Cardiovascular;  Laterality: N/A;   PERCUTANEOUS CORONARY STENT INTERVENTION (PCI-S) N/A 03/29/2012   Procedure: PERCUTANEOUS CORONARY STENT INTERVENTION (PCI-S);  Surgeon: [name], MD;  Location: [facility] CATH LAB;  Service: Cardiovascular;  Laterality: N/A;   TONSILLECTOMY  81 years old    There were no vitals filed for this visit.   Subjective Assessment - 01/13/22 1114     Subjective Pt denies any adverse effects after prior Rx.   Pt states she forgets her cane and walks more without her cane.  pt is able to stand up longer to cook, but is still limited with standing distance.  She reports improved rolling over in bed.  pt has improved tolerance with playing with grandchildren.   Pt able to shower independently.  Pt reports improved sx's including ambulation with the heel lift.  Pt noticed swelling in L ankle on Saturday for no specific reason.  Pt states the edema is fine 1st thing in AM though soon after she gets up, her ankle swells.  Pt states she had a lot of issues with edema after the surgery which went away around October.  pt states it does not hurt.    Patient Stated Goals Pt wants to be able to stand for longer duration.    Currently in Pain? Yes    Pain Score --   2-3/10   Pain Location --   anterior hip, lateral thigh, lateral hip               [facility] PT Assessment - 01/13/22 0001       Observation/Other Assessments   Observations Noticeable swelling in L ankle and some swelling in L knee;  No tenderness in palpation of L calf and L post knee.                           [facility] Adult PT Treatment/Exercise - 01/13/22 0001       Neuro Re-ed    Neuro Re-ed Details  Marching on airex with bilat UE assist  2x10 reps, standing on airex with FT 2x30 sec, staggered stance on airex 2x30 sec bilat, step ups on airex with L LE leading with light UE assist x 10 reps      Exercises   Exercises Knee/Hip   Reviewed current function, response to prior Rx, pain level, and HEP compliance.     Knee/Hip Exercises: Standing   Other Standing Knee Exercises sidestepping with bilat UE's on table, heel raises 2x10 reps, sit to stands from chair 2x5 reps, standing L flexion SLR 2 x 10 reps, standing hip abduction L LE 2x10 with bilat UE assist, ABD Iso with GTB 5s 10x,  Bridging  with ABD iso 10x      Knee/Hip Exercises: Sidelying   Other Sidelying Knee/Hip Exercises Pt performed S/L hip abduction  approx 4 reps with pillow b/w knees   Pt had L lateral hip pain.                    PT Education - 01/13/22 1129     Education Details exercise form.  spoke to Pt about seeing MD concerning swelling.    Person(s) Educated Patient    Methods Explanation;Demonstration;Tactile cues;Verbal cues    Comprehension Returned demonstration;Verbalized understanding;Verbal cues required;Tactile cues required;Need further instruction              PT Short Term Goals - 12/29/21 1126       PT SHORT TERM GOAL #1   Title pt will demo hip flexion, abd and extension strength at least 4/5    Baseline see flowsheet    Status Partially Met      PT SHORT TERM GOAL #2   Title pt will be safe in household ambulation with SPC    Baseline most of the time I use the cane in the house, I just use the walker to carry things    Status Achieved      PT SHORT TERM GOAL #3   Title pt will be able to navigate stairs with use of hand rail and SPC to go upstairs in her home    Status Achieved               PT Long Term Goals - 12/29/21 1127       PT LONG TERM GOAL #1   Title Pt will be able to play with her grandchildren without limitations    Status Partially Met      PT LONG TERM GOAL #2   Title pt will be able  to ambulate for at least  1 hour without AD for functional ambulation    Baseline still using AD consistently,walking about 500 feet from car to pool and I need to sit because of pain and fatigue    Status Partially Met      PT LONG TERM GOAL #3   Title pt will be able to stand and cook large meal without rest break    Baseline 9/27: pt has made breakfast stanidng, 11/15 I made a soup today but had help    Status Partially Met      PT LONG TERM GOAL #4   Title pt will d/c to proper long term program including nustep for cardiac challenges    Status On-going                   Plan - 01/13/22 2235     Clinical Impression Statement Pt is improving with function as evidenced by subjective reports.  Though she has improved with standing, she continues to be limited with standing duration.  Pt has swelling in L ankle and reports she has had the swelling since Saturday.  She had some swelling in knee also.  Pt has no tenderness with palpation of post knee or calf.  PT discussed with pt talking to MD about swelling.  Pt demonstrates improved tolerance to [place] exercises today. She performed exercises well with cuing and instruction in correct form. Pt responded well to Rx stating she felt better after Rx than beore Rx.    PT Treatment/Interventions ADLs/Self Care Home Management;Aquatic Therapy;Cryotherapy;Electrical Stimulation;Gait training;Ultrasound;Traction;Moist Heat;Stair training;Functional mobility training;Therapeutic activities;Therapeutic exercise;Balance training;Neuromuscular re-education;Manual techniques;Patient/family education;Passive range of motion;Dry needling;Taping    PT Next Visit Plan May benefit from increased land based therapy:  land- gait training, CKC strength and endurance;  pool- hip abductor strength, balance.    PT Home Exercise Plan [id number]    Consulted and Agree with Plan of Care Patient             Patient will benefit from skilled therapeutic  intervention in order to improve the following deficits and impairments:  Abnormal gait, Decreased range of motion, Difficulty walking, Decreased activity tolerance, Pain, Improper body mechanics, Impaired flexibility, Decreased balance, Decreased scar mobility, Decreased strength, Postural dysfunction  Visit Diagnosis: Difficulty in walking, not elsewhere classified  Muscle weakness (generalized)  Pain in left hip     Problem List Patient Active Problem List   Diagnosis Date Noted   Pain due to onychomycosis of toenails of both feet 08/22/2021   Intertrochanteric fracture of left hip ([place]) 06/24/2021   Closed left hip fracture, initial encounter ([place]) 06/20/2021   Hypokalemia 06/20/2021   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   Cognitive complaints 06/02/2016   Hypoxemic respiratory failure, chronic ([place]) 09/19/2015   MCI (mild cognitive impairment) 03/06/2015   Depression with anxiety 03/06/2015   Bullae 07/02/2014   History of MRSA infection 11/20/2012   Low back pain 07/07/2012   Insomnia 04/28/2012   Coronary Artery Disease 04/19/2012   Hyperlipidemia 04/19/2012   Anxiety 04/19/2012   Non-ST elevation myocardial infarction (NSTEMI), initial care episode ([place]) 03/28/2012   HYPERTENSION, BENIGN 06/12/2010   Dyspnea 06/12/2010   Prolonged QT interval 06/12/2010    Selinda Michaels III PT, DPT 01/13/22 10:44 PM   [facility] [address] [place], [place], [address] Phone: [phone]   Fax:  [phone]  Name: KATHERIN RAMEY MRN: [id number] Date of Birth: 1941/03/19

## 2022-01-14 ENCOUNTER — Telehealth: Payer: Self-pay | Admitting: Internal Medicine

## 2022-01-14 NOTE — Telephone Encounter (Signed)
error 

## 2022-01-15 ENCOUNTER — Encounter (HOSPITAL_BASED_OUTPATIENT_CLINIC_OR_DEPARTMENT_OTHER): Payer: Self-pay | Admitting: Physical Therapy

## 2022-01-15 ENCOUNTER — Other Ambulatory Visit: Payer: Self-pay

## 2022-01-15 ENCOUNTER — Ambulatory Visit (HOSPITAL_BASED_OUTPATIENT_CLINIC_OR_DEPARTMENT_OTHER): Payer: Medicare Other | Admitting: Physical Therapy

## 2022-01-15 DIAGNOSIS — R293 Abnormal posture: Secondary | ICD-10-CM | POA: Diagnosis not present

## 2022-01-15 DIAGNOSIS — R262 Difficulty in walking, not elsewhere classified: Secondary | ICD-10-CM

## 2022-01-15 DIAGNOSIS — M6281 Muscle weakness (generalized): Secondary | ICD-10-CM

## 2022-01-15 DIAGNOSIS — M25552 Pain in left hip: Secondary | ICD-10-CM

## 2022-01-15 NOTE — Therapy (Signed)
[place], [place], [address] Phone: [phone]   Fax:  [phone]  Physical Therapy Treatment  Patient Details  Name: Carrie Chung MRN: [id number] Date of Birth: December 03, 1941 Referring Provider (PT): Edmonia Lynch, MD   Encounter Date: 01/15/2022   PT End of Session - 01/15/22 1239     Visit Number 30    Number of Visits 40    Date for PT Re-Evaluation 03/29/22    Authorization Type MCR    Progress Note Due on Visit 44    PT Start Time 1100    PT Stop Time 1145    PT Time Calculation (min) 45 min    Activity Tolerance Patient tolerated treatment well    Behavior During Therapy [facility] for tasks assessed/performed             Past Medical History:  Diagnosis Date   Atrial tachycardia ([place])    a. noted at cardiac rehab 5/13; event monitor ordered to assess for AFib   CAD (coronary artery disease)    a. NSTEMI 03/2012 ([place] 03/27/12: pLAD 30%, mLAD 99%, mCFX 95%, mRCA 99% with thrombus, EF 65%);  s/p PTCA/DESx1 to prox-mid RCA 03/27/12 urgently in setting of hypotension/bradycardia, with staged PTCA/Evolve study stent to mid LAD & PTCA/Evolve study stent to prox LCx 03/29/12 ;   echo 03/28/12:EF 60%, Aortic sclerosis without AS, mild RVE, mild reduced RVSF     Endometrial polyp    HTN (hypertension)    Hyperlipidemia    MI, old    Sleep related hypoxia     Past Surgical History:  Procedure Laterality Date   APPENDECTOMY  81 years old   CAROTID STENT  04/2012   x3   CESAREAN SECTION  1984   CHOLECYSTECTOMY  1978   INTRAMEDULLARY (IM) NAIL INTERTROCHANTERIC Left 06/20/2021   Procedure: INTRAMEDULLARY (IM) NAIL INTERTROCHANTRIC;  Surgeon: Renette Butters, MD;  Location: [facility];  Service: Orthopedics;  Laterality: Left;   LEFT HEART CATHETERIZATION WITH CORONARY ANGIOGRAM N/A 03/27/2012   Procedure: LEFT HEART CATHETERIZATION WITH CORONARY ANGIOGRAM;  Surgeon: Burnell Blanks, MD;  Location: [facility]  CATH LAB;  Service: Cardiovascular;  Laterality: N/A;   PERCUTANEOUS CORONARY STENT INTERVENTION (PCI-S) N/A 03/27/2012   Procedure: PERCUTANEOUS CORONARY STENT INTERVENTION (PCI-S);  Surgeon: Burnell Blanks, MD;  Location: [facility] CATH LAB;  Service: Cardiovascular;  Laterality: N/A;   PERCUTANEOUS CORONARY STENT INTERVENTION (PCI-S) N/A 03/29/2012   Procedure: PERCUTANEOUS CORONARY STENT INTERVENTION (PCI-S);  Surgeon: [name], MD;  Location: [facility] CATH LAB;  Service: Cardiovascular;  Laterality: N/A;   TONSILLECTOMY  81 years old    There were no vitals filed for this visit.   Subjective Assessment - 01/15/22 1100     Subjective Pt states the R ankle is still mildly swollen and not as bad as it was. Pt states the L hip is still tender into the hip but is much better. Pain is down from 5/10 to 3/10.    Patient Stated Goals Pt wants to be able to stand for longer duration.    Currently in Pain? No/denies                      Neuro Re-ed     Neuro Re-ed Details  Fwd and lateral step up 4" box  2x10         Exercises    Exercises Seated fwd flexion with swiss ball 3s 10x Focus on L hip  stretch; seated hip ABD iso 5s 10x blue TB, STS with blue ABD iso from raised table heights 3x10    Knee/Hip Exercises: Supine     LTR 3s 10x each   Manual Therapy: Lateral and inf L hip Mulligan mobilizations grade IV       Progress Note needed at next session.             PT Education - 01/15/22 1104     Education Details anatomy, exercise progression, muscle firing, HEP    Person(s) Educated Patient    Methods Explanation;Demonstration;Tactile cues;Verbal cues    Comprehension Verbalized understanding;Returned demonstration;Verbal cues required              PT Short Term Goals - 12/29/21 1126       PT SHORT TERM GOAL #1   Title pt will demo hip flexion, abd and extension strength at least 4/5    Baseline see flowsheet    Status Partially Met      PT  SHORT TERM GOAL #2   Title pt will be safe in household ambulation with SPC    Baseline most of the time I use the cane in the house, I just use the walker to carry things    Status Achieved      PT SHORT TERM GOAL #3   Title pt will be able to navigate stairs with use of hand rail and SPC to go upstairs in her home    Status Achieved               PT Long Term Goals - 12/29/21 1127       PT LONG TERM GOAL #1   Title Pt will be able to play with her grandchildren without limitations    Status Partially Met      PT LONG TERM GOAL #2   Title pt will be able to ambulate for at least 1 hour without AD for functional ambulation    Baseline still using AD consistently,walking about 500 feet from car to pool and I need to sit because of pain and fatigue    Status Partially Met      PT LONG TERM GOAL #3   Title pt will be able to stand and cook large meal without rest break    Baseline 9/27: pt has made breakfast stanidng, 11/15 I made a soup today but had help    Status Partially Met      PT LONG TERM GOAL #4   Title pt will d/c to proper long term program including nustep for cardiac challenges    Status On-going                   Plan - 01/15/22 1105     Clinical Impression Statement Pt with limited hip flexion and ER ROM today but with good response to stretching and self mobilization exercise. Pt was then able to follow up with functional hip and L LE strengthening without increased pain. Pt progressed well with step ups into multiple planes. Pt still with L hip stiffness and will likely need continued manual therapy in order to improve overall mobility. Pt does appear to lack AROM hip extension on L that may be related to continued L glute pain and soreness. Plan to continue with L hip ROM and strength. Pt would benefit from continued skilled therapy in order to reach goals and maximize functional L LE strength and ROM for prevention of future falls.  Stability/Clinical Decision Making Stable/Uncomplicated    Rehab Potential Good    PT Frequency 2x / week    PT Duration 8 weeks    PT Treatment/Interventions ADLs/Self Care Home Management;Aquatic Therapy;Cryotherapy;Electrical Stimulation;Gait training;Ultrasound;Traction;Moist Heat;Stair training;Functional mobility training;Therapeutic activities;Therapeutic exercise;Balance training;Neuromuscular re-education;Manual techniques;Patient/family education;Passive range of motion;Dry needling;Taping    PT Next Visit Plan May benefit from increased land based therapy:  land- gait training, CKC strength and endurance;  pool- hip abductor strength, balance.    PT Home Exercise Plan [id number]    Consulted and Agree with Plan of Care Patient             Patient will benefit from skilled therapeutic intervention in order to improve the following deficits and impairments:  Abnormal gait, Decreased range of motion, Difficulty walking, Decreased activity tolerance, Pain, Improper body mechanics, Impaired flexibility, Decreased balance, Decreased scar mobility, Decreased strength, Postural dysfunction  Visit Diagnosis: Difficulty in walking, not elsewhere classified  Muscle weakness (generalized)  Pain in left hip     Problem List Patient Active Problem List   Diagnosis Date Noted   Pain due to onychomycosis of toenails of both feet 08/22/2021   Intertrochanteric fracture of left hip ([place]) 06/24/2021   Closed left hip fracture, initial encounter ([place]) 06/20/2021   Hypokalemia 06/20/2021   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   Cognitive complaints 06/02/2016   Hypoxemic respiratory failure, chronic ([place]) 09/19/2015   MCI (mild cognitive impairment) 03/06/2015   Depression with anxiety 03/06/2015   Bullae 07/02/2014   History of MRSA infection 11/20/2012   Low back pain 07/07/2012   Insomnia 04/28/2012   Coronary Artery Disease 04/19/2012   Hyperlipidemia 04/19/2012   Anxiety 04/19/2012    Non-ST elevation myocardial infarction (NSTEMI), initial care episode ([place]) 03/28/2012   HYPERTENSION, BENIGN 06/12/2010   Dyspnea 06/12/2010   Prolonged QT interval 06/12/2010    Daleen Bo, PT 01/15/2022, 12:44 PM  [facility] [address] [place], [place], [address] Phone: [phone]   Fax:  [phone]  Name: Carrie Chung MRN: [id number] Date of Birth: 1941-05-09

## 2022-01-21 ENCOUNTER — Other Ambulatory Visit: Payer: Self-pay

## 2022-01-21 ENCOUNTER — Ambulatory Visit (HOSPITAL_BASED_OUTPATIENT_CLINIC_OR_DEPARTMENT_OTHER): Payer: Medicare Other | Attending: Physical Medicine and Rehabilitation | Admitting: Physical Therapy

## 2022-01-21 ENCOUNTER — Encounter (HOSPITAL_BASED_OUTPATIENT_CLINIC_OR_DEPARTMENT_OTHER): Payer: Self-pay | Admitting: Physical Therapy

## 2022-01-21 DIAGNOSIS — M25552 Pain in left hip: Secondary | ICD-10-CM | POA: Insufficient documentation

## 2022-01-21 DIAGNOSIS — R293 Abnormal posture: Secondary | ICD-10-CM | POA: Insufficient documentation

## 2022-01-21 DIAGNOSIS — M6281 Muscle weakness (generalized): Secondary | ICD-10-CM | POA: Insufficient documentation

## 2022-01-21 DIAGNOSIS — R2681 Unsteadiness on feet: Secondary | ICD-10-CM | POA: Diagnosis not present

## 2022-01-21 DIAGNOSIS — R262 Difficulty in walking, not elsewhere classified: Secondary | ICD-10-CM | POA: Insufficient documentation

## 2022-01-21 NOTE — Therapy (Signed)
[facility], [place], [address] Phone: [phone]   Fax:  [phone]  Physical Therapy Progress Note  Progress Note Reporting Period 08/15/21 to 01/21/22  See note below for Objective Data and Assessment of Progress/Goals.      Patient Details  Name: Carrie Chung MRN: [id number] Date of Birth: [birth year] Referring Provider (PT): Edmonia Lynch, MD   Encounter Date: 01/21/2022   PT End of Session - 01/21/22 1017     Visit Number 31    Number of Visits 40    Date for PT Re-Evaluation 03/29/22    Authorization Type MCR    Progress Note Due on Visit 47    PT Start Time 1016    PT Stop Time 1100    PT Time Calculation (min) 44 min    Activity Tolerance Patient tolerated treatment well    Behavior During Therapy [facility] for tasks assessed/performed             Past Medical History:  Diagnosis Date   Atrial tachycardia ([place])    a. noted at cardiac rehab 5/13; event monitor ordered to assess for AFib   CAD (coronary artery disease)    a. NSTEMI 03/2012 ([place] 03/27/12: pLAD 30%, mLAD 99%, mCFX 95%, mRCA 99% with thrombus, EF 65%);  s/p PTCA/DESx1 to prox-mid RCA 03/27/12 urgently in setting of hypotension/bradycardia, with staged PTCA/Evolve study stent to mid LAD & PTCA/Evolve study stent to prox LCx 03/29/12 ;   echo 03/28/12:EF 60%, Aortic sclerosis without AS, mild RVE, mild reduced RVSF     Endometrial polyp    HTN (hypertension)    Hyperlipidemia    MI, old    Sleep related hypoxia     Past Surgical History:  Procedure Laterality Date   APPENDECTOMY  81 years old   CAROTID STENT  04/2012   x3   CESAREAN SECTION  1984   CHOLECYSTECTOMY  1978   INTRAMEDULLARY (IM) NAIL INTERTROCHANTERIC Left 06/20/2021   Procedure: INTRAMEDULLARY (IM) NAIL INTERTROCHANTRIC;  Surgeon: Renette Butters, MD;  Location: [facility];  Service: Orthopedics;  Laterality: Left;   LEFT HEART CATHETERIZATION WITH CORONARY  ANGIOGRAM N/A 03/27/2012   Procedure: LEFT HEART CATHETERIZATION WITH CORONARY ANGIOGRAM;  Surgeon: Burnell Blanks, MD;  Location: [facility] CATH LAB;  Service: Cardiovascular;  Laterality: N/A;   PERCUTANEOUS CORONARY STENT INTERVENTION (PCI-S) N/A 03/27/2012   Procedure: PERCUTANEOUS CORONARY STENT INTERVENTION (PCI-S);  Surgeon: Burnell Blanks, MD;  Location: [facility] CATH LAB;  Service: Cardiovascular;  Laterality: N/A;   PERCUTANEOUS CORONARY STENT INTERVENTION (PCI-S) N/A 03/29/2012   Procedure: PERCUTANEOUS CORONARY STENT INTERVENTION (PCI-S);  Surgeon: [name], MD;  Location: [facility] CATH LAB;  Service: Cardiovascular;  Laterality: N/A;   TONSILLECTOMY  81 years old    There were no vitals filed for this visit.   Subjective Assessment - 01/21/22 1019     Subjective Pt states that the pain today is good. "She has been bending too much" which aggravated it over the weekend. Pt did have trouble with turning at night. She feels her mobility is much better. She feels she is between 25-30%. She feels she is lacking bending, reaching, sleeper on her back, and standing longer. She feels very fatigued in the leg.    Patient Stated Goals Pt wants to be able to stand for longer duration.    Currently in Pain? No/denies    Pain Score 0-No pain  [facility] PT Assessment - 01/21/22 0001       Assessment   Medical Diagnosis s/p IM Nail Lt femur, closed reduction and manipultion of Lt hip fx    Referring Provider (PT) Edmonia Lynch, MD    Onset Date/Surgical Date 06/20/21    Prior Therapy inpatient and home health      Precautions   Precautions Fall      Balance Screen   Has the patient fallen in the past 6 months No    Has the patient had a decrease in activity level because of a fear of falling?  No    Is the patient reluctant to leave their home because of a fear of falling?  No      Prior Function   Vocation Requirements play with grand children, cooking      AROM    Overall AROM Comments flex: 100, ABD 5, IR 10, ER 25, ext 0      PROM   Overall PROM Comments flex 115, ER 30, IR 20, ABD 20, ext 5      Strength   Overall Strength Comments 4/5 flexion, 4-/5 with ABD, ADD and ext      Transfers   Five time sit to stand comments  28s   no UE, offweight of L LE     Ambulation/Gait   Gait Comments antalgic, decreased L LE stance time, decreased hip extension   no cane needed for level surface in clinic              Neuro Re-ed      Neuro Re-ed Details  Fwd and lateral step up 4" box  2x10           Exercises    Exercises Back support butterfly stretch no OP 5s 15x Supine strap heel slide 10s 10x AROM hip ABD 15x LAQ 3lbs 3x10             Full review of HEP  Exercises Standing Hip Flexor Stretch - 2 x daily - 7 x weekly - 1 sets - 15 reps - 5 hold Side Lunge Adductor Stretch - 2 x daily - 7 x weekly - 1 sets - 15 reps - 5 hold Sit to Stand with Arms Crossed - 2 x daily - 7 x weekly - 2 sets - 5 reps Butterfly Groin Stretch - 2 x daily - 7 x weekly - 1 sets - 10 reps - 10 hold Sitting Knee Extension with Resistance - 2 x daily - 7 x weekly - 2 sets - 10 reps     PT Education - 01/21/22 1022     Education Details anatomy, exercise progression, muscle firing, HEP, examing findings, progress made    Person(s) Educated Patient    Methods Explanation;Demonstration;Tactile cues;Verbal cues    Comprehension Verbalized understanding;Returned demonstration;Verbal cues required;Tactile cues required              PT Short Term Goals - 12/29/21 1126       PT SHORT TERM GOAL #1   Title pt will demo hip flexion, abd and extension strength at least 4/5    Baseline see flowsheet    Status Partially Met      PT SHORT TERM GOAL #2   Title pt will be safe in household ambulation with SPC    Baseline most of the time I use the cane in the house, I just use the walker to carry things    Status Achieved  PT SHORT TERM GOAL #3   Title  pt will be able to navigate stairs with use of hand rail and SPC to go upstairs in her home    Status Achieved               PT Long Term Goals - 12/29/21 1127       PT LONG TERM GOAL #1   Title Pt will be able to play with her grandchildren without limitations    Status Partially Met      PT LONG TERM GOAL #2   Title pt will be able to ambulate for at least 1 hour without AD for functional ambulation    Baseline still using AD consistently,walking about 500 feet from car to pool and I need to sit because of pain and fatigue    Status Partially Met      PT LONG TERM GOAL #3   Title pt will be able to stand and cook large meal without rest break    Baseline 9/27: pt has made breakfast stanidng, 11/15 I made a soup today but had help    Status Partially Met      PT LONG TERM GOAL #4   Title pt will d/c to proper long term program including nustep for cardiac challenges    Status On-going                   Plan - 01/21/22 1040     Clinical Impression Statement Pt with improved mobility and ROM as compared to previous assessments. Pt's has objective improvement in mobility as well as overall strength ROM measures. However, pt is still limited by L hip joint stiffness as well as postero-lateral hip soft tissue restrictions. Pt is progressing with PT again since restarting visits after COVID-19 infection and recurrence. Pt would benefit from continued skilled therapy in order to reach goals and maximize functional L LE strength and ROM for prevention of future falls.    Stability/Clinical Decision Making Stable/Uncomplicated    Rehab Potential Good    PT Frequency 2x / week    PT Duration 8 weeks    PT Treatment/Interventions ADLs/Self Care Home Management;Aquatic Therapy;Cryotherapy;Electrical Stimulation;Gait training;Ultrasound;Traction;Moist Heat;Stair training;Functional mobility training;Therapeutic activities;Therapeutic exercise;Balance training;Neuromuscular  re-education;Manual techniques;Patient/family education;Passive range of motion;Dry needling;Taping    PT Next Visit Plan May benefit from increased land based therapy:  land- gait training, CKC strength and endurance;  pool- hip abductor strength, balance.    PT Home Exercise Plan [email or web address]    Consulted and Agree with Plan of Care Patient             Patient will benefit from skilled therapeutic intervention in order to improve the following deficits and impairments:  Abnormal gait, Decreased range of motion, Difficulty walking, Decreased activity tolerance, Pain, Improper body mechanics, Impaired flexibility, Decreased balance, Decreased scar mobility, Decreased strength, Postural dysfunction  Visit Diagnosis: Difficulty in walking, not elsewhere classified  Muscle weakness (generalized)  Pain in left hip  Abnormal posture     Problem List Patient Active Problem List   Diagnosis Date Noted   Pain due to onychomycosis of toenails of both feet 08/22/2021   Intertrochanteric fracture of left hip ([place]) 06/24/2021   Closed left hip fracture, initial encounter ([place]) 06/20/2021   Hypokalemia 06/20/2021   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   Cognitive complaints 06/02/2016   Hypoxemic respiratory failure, chronic ([place]) 09/19/2015   MCI (mild cognitive impairment) 03/06/2015   Depression with anxiety 03/06/2015   Bullae  07/02/2014   History of MRSA infection 11/20/2012   Low back pain 07/07/2012   Insomnia 04/28/2012   Coronary Artery Disease 04/19/2012   Hyperlipidemia 04/19/2012   Anxiety 04/19/2012   Non-ST elevation myocardial infarction (NSTEMI), initial care episode ([place]) 03/28/2012   HYPERTENSION, BENIGN 06/12/2010   Dyspnea 06/12/2010   Prolonged QT interval 06/12/2010    Daleen Bo, PT 01/21/2022, 12:49 PM  [facility] [address] [place], [place], [address] Phone: [phone]   Fax:  [phone]  Name: JAYLON GRODE MRN: [id number] Date of Birth: 1941/08/01

## 2022-01-21 NOTE — Patient Instructions (Signed)
Access Code: [email or web address] URL: [email or web address] Date: 01/21/2022 Prepared by: Daleen Bo  Exercises Standing Hip Flexor Stretch - 2 x daily - 7 x weekly - 1 sets - 15 reps - 5 hold Side Lunge Adductor Stretch - 2 x daily - 7 x weekly - 1 sets - 15 reps - 5 hold Sit to Stand with Arms Crossed - 2 x daily - 7 x weekly - 2 sets - 5 reps Butterfly Groin Stretch - 2 x daily - 7 x weekly - 1 sets - 10 reps - 10 hold Sitting Knee Extension with Resistance - 2 x daily - 7 x weekly - 2 sets - 10 reps

## 2022-01-23 ENCOUNTER — Ambulatory Visit: Payer: Medicare Other | Admitting: Pulmonary Disease

## 2022-01-23 ENCOUNTER — Ambulatory Visit (INDEPENDENT_AMBULATORY_CARE_PROVIDER_SITE_OTHER): Payer: Medicare Other | Admitting: Pulmonary Disease

## 2022-01-23 ENCOUNTER — Encounter (HOSPITAL_BASED_OUTPATIENT_CLINIC_OR_DEPARTMENT_OTHER): Payer: Self-pay

## 2022-01-23 ENCOUNTER — Encounter (HOSPITAL_BASED_OUTPATIENT_CLINIC_OR_DEPARTMENT_OTHER): Payer: Self-pay | Admitting: Physical Therapy

## 2022-01-23 ENCOUNTER — Ambulatory Visit (HOSPITAL_BASED_OUTPATIENT_CLINIC_OR_DEPARTMENT_OTHER): Payer: Medicare Other | Admitting: Physical Therapy

## 2022-01-23 ENCOUNTER — Other Ambulatory Visit: Payer: Self-pay

## 2022-01-23 ENCOUNTER — Encounter: Payer: Self-pay | Admitting: Pulmonary Disease

## 2022-01-23 DIAGNOSIS — R293 Abnormal posture: Secondary | ICD-10-CM | POA: Diagnosis not present

## 2022-01-23 DIAGNOSIS — J9611 Chronic respiratory failure with hypoxia: Secondary | ICD-10-CM | POA: Diagnosis not present

## 2022-01-23 DIAGNOSIS — R2681 Unsteadiness on feet: Secondary | ICD-10-CM | POA: Diagnosis not present

## 2022-01-23 DIAGNOSIS — M6281 Muscle weakness (generalized): Secondary | ICD-10-CM | POA: Diagnosis not present

## 2022-01-23 DIAGNOSIS — R262 Difficulty in walking, not elsewhere classified: Secondary | ICD-10-CM | POA: Diagnosis not present

## 2022-01-23 DIAGNOSIS — M25552 Pain in left hip: Secondary | ICD-10-CM | POA: Diagnosis not present

## 2022-01-23 NOTE — Assessment & Plan Note (Signed)
No clear etiology identified. No evidence of ILD or pulmonary hypertension.  Previous and PSG did not show significant OSA, we have not pursued repeat sleep study. She has mild emphysema but no airway obstruction on PFTs

## 2022-01-23 NOTE — Therapy (Signed)
[facility] [place], [place], [address] Phone: [phone]   Fax:  [phone]  Physical Therapy Treatment Note      Patient Details  Name: Carrie Chung MRN: [id number] Date of Birth: [birth year] Referring Provider (PT): Edmonia Lynch, MD   Encounter Date: 01/23/2022   PT End of Session - 01/23/22 1039     Visit Number 32    Number of Visits 40    Date for PT Re-Evaluation 03/29/22    Authorization Type MCR    Progress Note Due on Visit 46    PT Start Time 3329    PT Stop Time 1100    PT Time Calculation (min) 45 min    Activity Tolerance Patient tolerated treatment well    Behavior During Therapy [facility] for tasks assessed/performed              Past Medical History:  Diagnosis Date   Atrial tachycardia ([place])    a. noted at cardiac rehab 5/13; event monitor ordered to assess for AFib   CAD (coronary artery disease)    a. NSTEMI 03/2012 ([place] 03/27/12: pLAD 30%, mLAD 99%, mCFX 95%, mRCA 99% with thrombus, EF 65%);  s/p PTCA/DESx1 to prox-mid RCA 03/27/12 urgently in setting of hypotension/bradycardia, with staged PTCA/Evolve study stent to mid LAD & PTCA/Evolve study stent to prox LCx 03/29/12 ;   echo 03/28/12:EF 60%, Aortic sclerosis without AS, mild RVE, mild reduced RVSF     Endometrial polyp    HTN (hypertension)    Hyperlipidemia    MI, old    Sleep related hypoxia     Past Surgical History:  Procedure Laterality Date   APPENDECTOMY  81 years old   CAROTID STENT  04/2012   x3   CESAREAN SECTION  1984   CHOLECYSTECTOMY  1978   INTRAMEDULLARY (IM) NAIL INTERTROCHANTERIC Left 06/20/2021   Procedure: INTRAMEDULLARY (IM) NAIL INTERTROCHANTRIC;  Surgeon: Renette Butters, MD;  Location: [facility];  Service: Orthopedics;  Laterality: Left;   LEFT HEART CATHETERIZATION WITH CORONARY ANGIOGRAM N/A 03/27/2012   Procedure: LEFT HEART CATHETERIZATION WITH CORONARY ANGIOGRAM;  Surgeon: Burnell Blanks, MD;   Location: [facility] CATH LAB;  Service: Cardiovascular;  Laterality: N/A;   PERCUTANEOUS CORONARY STENT INTERVENTION (PCI-S) N/A 03/27/2012   Procedure: PERCUTANEOUS CORONARY STENT INTERVENTION (PCI-S);  Surgeon: Burnell Blanks, MD;  Location: [facility] CATH LAB;  Service: Cardiovascular;  Laterality: N/A;   PERCUTANEOUS CORONARY STENT INTERVENTION (PCI-S) N/A 03/29/2012   Procedure: PERCUTANEOUS CORONARY STENT INTERVENTION (PCI-S);  Surgeon: [name], MD;  Location: [facility] CATH LAB;  Service: Cardiovascular;  Laterality: N/A;   TONSILLECTOMY  81 years old    There were no vitals filed for this visit.   Subjective Assessment - 01/23/22 1020     Subjective Pt states the hip feels okay today. It is sore around the entire socket. "It feels like a hinge." Pt states the new groin stretch was very intense.    Patient Stated Goals Pt wants to be able to stand for longer duration.    Currently in Pain? Yes    Pain Score 2     Pain Location Hip    Pain Orientation Left    Pain Descriptors / Indicators Sore                    Manual: STM L gluteal;   PROM hip flexion and ER to tolerance SKTC with towel 3s 10x Supine piriformis stretch 30s 3x  Seated flexion stool stretch- hips in ER 5s 15x Stair step flexion lunging stretch 5s 15x    Full review of HEP  Exercises Standing Hip Flexor Stretch - 2 x daily - 7 x weekly - 1 sets - 15 reps - 5 hold Side Lunge Adductor Stretch - 2 x daily - 7 x weekly - 1 sets - 15 reps - 5 hold Sit to Stand with Arms Crossed - 2 x daily - 7 x weekly - 2 sets - 5 reps Butterfly Groin Stretch - 2 x daily - 7 x weekly - 1 sets - 10 reps - 10 hold Sitting Knee Extension with Resistance - 2 x daily - 7 x weekly - 2 sets - 10 reps        PT Short Term Goals - 12/29/21 1126       PT SHORT TERM GOAL #1   Title pt will demo hip flexion, abd and extension strength at least 4/5    Baseline see flowsheet    Status Partially Met      PT SHORT TERM GOAL #2    Title pt will be safe in household ambulation with SPC    Baseline most of the time I use the cane in the house, I just use the walker to carry things    Status Achieved      PT SHORT TERM GOAL #3   Title pt will be able to navigate stairs with use of hand rail and SPC to go upstairs in her home    Status Achieved               PT Long Term Goals - 12/29/21 1127       PT LONG TERM GOAL #1   Title Pt will be able to play with her grandchildren without limitations    Status Partially Met      PT LONG TERM GOAL #2   Title pt will be able to ambulate for at least 1 hour without AD for functional ambulation    Baseline still using AD consistently,walking about 500 feet from car to pool and I need to sit because of pain and fatigue    Status Partially Met      PT LONG TERM GOAL #3   Title pt will be able to stand and cook large meal without rest break    Baseline 9/27: pt has made breakfast stanidng, 11/15 I made a soup today but had help    Status Partially Met      PT LONG TERM GOAL #4   Title pt will d/c to proper long term program including nustep for cardiac challenges    Status On-going                   Plan - 01/23/22 1105     Clinical Impression Statement Pt session focused on reducing L hip soft tissue restrictions through lower load and longer duration stretches. Pt was able to improve gait quality by end of session with improved hip extension as well as stance time on L. Pt without increase in pain. Plan to revisit STM as needed as well as improve max congruency/squat position for improving daily bending, lifting, and general ADL. Pt would benefit from continued skilled therapy in order to reach goals and maximize functional L LE strength and ROM for prevention of future falls.    Stability/Clinical Decision Making Stable/Uncomplicated    Rehab Potential Good    PT Frequency 2x /  week    PT Duration 8 weeks    PT Treatment/Interventions ADLs/Self Care  Home Management;Aquatic Therapy;Cryotherapy;Electrical Stimulation;Gait training;Ultrasound;Traction;Moist Heat;Stair training;Functional mobility training;Therapeutic activities;Therapeutic exercise;Balance training;Neuromuscular re-education;Manual techniques;Patient/family education;Passive range of motion;Dry needling;Taping    PT Next Visit Plan May benefit from increased land based therapy:  land- gait training, CKC strength and endurance;  pool- hip abductor strength, balance.    PT Home Exercise Plan [email or web address]    Consulted and Agree with Plan of Care Patient              Patient will benefit from skilled therapeutic intervention in order to improve the following deficits and impairments:  Abnormal gait, Decreased range of motion, Difficulty walking, Decreased activity tolerance, Pain, Improper body mechanics, Impaired flexibility, Decreased balance, Decreased scar mobility, Decreased strength, Postural dysfunction  Visit Diagnosis: Difficulty in walking, not elsewhere classified  Muscle weakness (generalized)  Pain in left hip  Unsteadiness on feet     Problem List Patient Active Problem List   Diagnosis Date Noted   Pain due to onychomycosis of toenails of both feet 08/22/2021   Intertrochanteric fracture of left hip ([place]) 06/24/2021   Closed left hip fracture, initial encounter ([place]) 06/20/2021   Hypokalemia 06/20/2021   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   Cognitive complaints 06/02/2016   Hypoxemic respiratory failure, chronic ([place]) 09/19/2015   MCI (mild cognitive impairment) 03/06/2015   Depression with anxiety 03/06/2015   Bullae 07/02/2014   History of MRSA infection 11/20/2012   Low back pain 07/07/2012   Insomnia 04/28/2012   Coronary Artery Disease 04/19/2012   Hyperlipidemia 04/19/2012   Anxiety 04/19/2012   Non-ST elevation myocardial infarction (NSTEMI), initial care episode ([place]) 03/28/2012   HYPERTENSION, BENIGN 06/12/2010   Dyspnea 06/12/2010   Prolonged  QT interval 06/12/2010    Daleen Bo, PT 01/23/2022, 11:07 AM  [facility] [place], [place], [address] Phone: [phone]   Fax:  [phone]  Name: OCTAVIA VELADOR MRN: [id number] Date of Birth: [birth year]

## 2022-01-23 NOTE — Patient Instructions (Signed)
°  Continue on oxygen during sleep

## 2022-01-23 NOTE — Progress Notes (Signed)
° °  Subjective:    Patient ID: Carrie Chung, female    DOB: 03-Nov-1941, 81 y.o.   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 Interim: She had a fall, left hip fracture, now ambulates with a cane still undergoing physical therapy, perioperative course was uneventful in terms of lung issues, used oxygen. She developed COVID infection around Thanksgiving, used paxlovid and had side effects   Breathing is okay, no wheezing no snoring or gasping episodes or witnessed apneas. Chest x-ray 06/2020 reviewed, clear lungs   Significant tests/ events reviewed   10/2021 ONO/RA  sat <88% for 1h 45 mins during sleep 11/2020 ONO desaturation 52 minutes less than 88% in 3 hours less than 89% ONO  09/2019 >>desatn x 6h   PSG 04/2015 ([facility])- did not show significant OSA -AHI 4.5, RDI 7.6/hwhich showed desaturation for 106 minutes.     PFT 06/2015 Nl lung function. No airflow obstruction/restriction. DLCO nml .  CT angio chest 06/2015 neg for PE, mild emphysema .  Bubble echo with gg 1 DD , EF ok , no shunt.   Review of Systems Patient denies significant dyspnea,cough, hemoptysis,  chest pain, palpitations, pedal edema, orthopnea, paroxysmal nocturnal dyspnea, lightheadedness, nausea, vomiting, abdominal or  leg pains      Objective:   Physical Exam  Gen. Pleasant, well-nourished, in no distress ENT - no thrush, no pallor/icterus,no post nasal drip Neck: No JVD, no thyromegaly, no carotid bruits Lungs: no use of accessory muscles, no dullness to percussion, clear without rales or rhonchi  Cardiovascular: Rhythm regular, heart sounds  normal, no murmurs or gallops, no peripheral edema Musculoskeletal: No deformities, no cyanosis or clubbing         Assessment & Plan:

## 2022-01-28 ENCOUNTER — Encounter (HOSPITAL_BASED_OUTPATIENT_CLINIC_OR_DEPARTMENT_OTHER): Payer: Self-pay

## 2022-01-28 ENCOUNTER — Encounter (HOSPITAL_BASED_OUTPATIENT_CLINIC_OR_DEPARTMENT_OTHER): Payer: Self-pay | Admitting: Physical Therapy

## 2022-01-28 ENCOUNTER — Ambulatory Visit (HOSPITAL_BASED_OUTPATIENT_CLINIC_OR_DEPARTMENT_OTHER): Payer: Medicare Other | Admitting: Physical Therapy

## 2022-01-28 DIAGNOSIS — R293 Abnormal posture: Secondary | ICD-10-CM | POA: Diagnosis not present

## 2022-01-28 DIAGNOSIS — M6281 Muscle weakness (generalized): Secondary | ICD-10-CM | POA: Diagnosis not present

## 2022-01-28 DIAGNOSIS — R2681 Unsteadiness on feet: Secondary | ICD-10-CM | POA: Diagnosis not present

## 2022-01-28 DIAGNOSIS — R262 Difficulty in walking, not elsewhere classified: Secondary | ICD-10-CM | POA: Diagnosis not present

## 2022-01-28 DIAGNOSIS — M25552 Pain in left hip: Secondary | ICD-10-CM | POA: Diagnosis not present

## 2022-01-28 NOTE — Therapy (Signed)
[facility] [address] [place], [place], [address] Phone: [phone]   Fax:  [phone]  Physical Therapy Treatment  Patient Details  Name: Carrie Chung MRN: [id number] Date of Birth: [birth year] Referring Provider (PT): Edmonia Lynch, MD   Encounter Date: 01/28/2022   PT End of Session - 01/28/22 1158     Visit Number 33    Number of Visits 40    Date for PT Re-Evaluation 03/29/22    Authorization Type MCR    Progress Note Due on Visit 31    PT Start Time 1105    PT Stop Time 1150    PT Time Calculation (min) 45 min    Activity Tolerance Patient tolerated treatment well    Behavior During Therapy [facility] for tasks assessed/performed             Past Medical History:  Diagnosis Date   Atrial tachycardia ([place])    a. noted at cardiac rehab 5/13; event monitor ordered to assess for AFib   CAD (coronary artery disease)    a. NSTEMI 03/2012 ([place] 03/27/12: pLAD 30%, mLAD 99%, mCFX 95%, mRCA 99% with thrombus, EF 65%);  s/p PTCA/DESx1 to prox-mid RCA 03/27/12 urgently in setting of hypotension/bradycardia, with staged PTCA/Evolve study stent to mid LAD & PTCA/Evolve study stent to prox LCx 03/29/12 ;   echo 03/28/12:EF 60%, Aortic sclerosis without AS, mild RVE, mild reduced RVSF     Endometrial polyp    HTN (hypertension)    Hyperlipidemia    MI, old    Sleep related hypoxia     Past Surgical History:  Procedure Laterality Date   APPENDECTOMY  81 years old   CAROTID STENT  04/2012   x3   CESAREAN SECTION  1984   CHOLECYSTECTOMY  1978   INTRAMEDULLARY (IM) NAIL INTERTROCHANTERIC Left 06/20/2021   Procedure: INTRAMEDULLARY (IM) NAIL INTERTROCHANTRIC;  Surgeon: Renette Butters, MD;  Location: [place];  Service: Orthopedics;  Laterality: Left;   LEFT HEART CATHETERIZATION WITH CORONARY ANGIOGRAM N/A 03/27/2012   Procedure: LEFT HEART CATHETERIZATION WITH CORONARY ANGIOGRAM;  Surgeon: Burnell Blanks, MD;  Location: [facility]  CATH LAB;  Service: Cardiovascular;  Laterality: N/A;   PERCUTANEOUS CORONARY STENT INTERVENTION (PCI-S) N/A 03/27/2012   Procedure: PERCUTANEOUS CORONARY STENT INTERVENTION (PCI-S);  Surgeon: Burnell Blanks, MD;  Location: [facility] CATH LAB;  Service: Cardiovascular;  Laterality: N/A;   PERCUTANEOUS CORONARY STENT INTERVENTION (PCI-S) N/A 03/29/2012   Procedure: PERCUTANEOUS CORONARY STENT INTERVENTION (PCI-S);  Surgeon: [name], MD;  Location: [facility] CATH LAB;  Service: Cardiovascular;  Laterality: N/A;   TONSILLECTOMY  81 years old    There were no vitals filed for this visit.   Subjective Assessment - 01/28/22 1107     Subjective Pt states that the hip is stiff today. She states the pain is off and on. She has been standing and walking more. She is not sleeping on her side as much.    Patient Stated Goals Pt wants to be able to stand for longer duration.    Currently in Pain? Yes    Pain Score 2     Pain Orientation Left    Pain Descriptors / Indicators Sore    Pain Type Surgical pain    Pain Onset More than a month ago                 Manual: Mulligan mobilizations inf and lateral grade III  Mob with movement flexion LAD with slight ABD  grade III  Bridge 2x10 LTR 3s 10x Step up 4" box 2x15- fwd andlateral Recumbent bike 5 min- L1; half turns with transition into full rotations for improvement of hip flexion and ER position        PT Short Term Goals - 12/29/21 1126       PT SHORT TERM GOAL #1   Title pt will demo hip flexion, abd and extension strength at least 4/5    Baseline see flowsheet    Status Partially Met      PT SHORT TERM GOAL #2   Title pt will be safe in household ambulation with SPC    Baseline most of the time I use the cane in the house, I just use the walker to carry things    Status Achieved      PT SHORT TERM GOAL #3   Title pt will be able to navigate stairs with use of hand rail and SPC to go upstairs in her home    Status  Achieved               PT Long Term Goals - 12/29/21 1127       PT LONG TERM GOAL #1   Title Pt will be able to play with her grandchildren without limitations    Status Partially Met      PT LONG TERM GOAL #2   Title pt will be able to ambulate for at least 1 hour without AD for functional ambulation    Baseline still using AD consistently,walking about 500 feet from car to pool and I need to sit because of pain and fatigue    Status Partially Met      PT LONG TERM GOAL #3   Title pt will be able to stand and cook large meal without rest break    Baseline 9/27: pt has made breakfast stanidng, 11/15 I made a soup today but had help    Status Partially Met      PT LONG TERM GOAL #4   Title pt will d/c to proper long term program including nustep for cardiac challenges    Status On-going                   Plan - 01/28/22 1159     Clinical Impression Statement Pt with limited hip flexion and ER ROM today but with good response to stretching and self mobilization exercise. Pt was then able to follow up CKC strengthening without increased pain. Pt progressed well with step ups into multiple planes onto 4" step with reduced UE support. Pt still with L hip stiffness but able to improve to symmetrical hip flexion by end of session with recumbent bike revolutions. Plan to continue with L hip ROM and strength. Pt would benefit from continued skilled therapy in order to reach goals and maximize functional L LE strength and ROM for prevention of future falls.    Examination-Activity Limitations Bathing;Locomotion Level;Bed Mobility;Bend;Sit;Caring for Others;Carry;Sleep;Squat;Stairs;Stand    Examination-Participation Restrictions Meal Prep;Cleaning;Community Activity    Stability/Clinical Decision Making Stable/Uncomplicated    Rehab Potential Good    PT Frequency 2x / week    PT Duration 8 weeks    PT Treatment/Interventions ADLs/Self Care Home Management;Aquatic  Therapy;Cryotherapy;Electrical Stimulation;Gait training;Ultrasound;Traction;Moist Heat;Stair training;Functional mobility training;Therapeutic activities;Therapeutic exercise;Balance training;Neuromuscular re-education;Manual techniques;Patient/family education;Passive range of motion;Dry needling;Taping    PT Next Visit Plan May benefit from increased land based therapy:  land- gait training, CKC strength and endurance;  PT Home Exercise Plan [id number]    Consulted and Agree with Plan of Care Patient             Patient will benefit from skilled therapeutic intervention in order to improve the following deficits and impairments:  Abnormal gait, Decreased range of motion, Difficulty walking, Decreased activity tolerance, Pain, Improper body mechanics, Impaired flexibility, Decreased balance, Decreased scar mobility, Decreased strength, Postural dysfunction  Visit Diagnosis: Difficulty in walking, not elsewhere classified  Muscle weakness (generalized)  Pain in left hip  Unsteadiness on feet     Problem List Patient Active Problem List   Diagnosis Date Noted   Pain due to onychomycosis of toenails of both feet 08/22/2021   Intertrochanteric fracture of left hip ([place]) 06/24/2021   Closed left hip fracture, initial encounter ([place]) 06/20/2021   Hypokalemia 06/20/2021   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   Cognitive complaints 06/02/2016   Hypoxemic respiratory failure, chronic ([place]) 09/19/2015   MCI (mild cognitive impairment) 03/06/2015   Depression with anxiety 03/06/2015   Bullae 07/02/2014   History of MRSA infection 11/20/2012   Low back pain 07/07/2012   Insomnia 04/28/2012   Coronary Artery Disease 04/19/2012   Hyperlipidemia 04/19/2012   Anxiety 04/19/2012   Non-ST elevation myocardial infarction (NSTEMI), initial care episode ([place]) 03/28/2012   HYPERTENSION, BENIGN 06/12/2010   Dyspnea 06/12/2010   Prolonged QT interval 06/12/2010    Daleen Bo, PT 01/28/2022, 12:06  PM  [facility] [address] [place], [place], [address] Phone: [phone]   Fax:  [phone]  Name: BEBE MONCURE MRN: [id number] Date of Birth: [birth year]

## 2022-01-30 ENCOUNTER — Other Ambulatory Visit: Payer: Self-pay

## 2022-01-30 ENCOUNTER — Encounter (HOSPITAL_BASED_OUTPATIENT_CLINIC_OR_DEPARTMENT_OTHER): Payer: Self-pay | Admitting: Physical Therapy

## 2022-01-30 ENCOUNTER — Ambulatory Visit (HOSPITAL_BASED_OUTPATIENT_CLINIC_OR_DEPARTMENT_OTHER): Payer: Medicare Other | Admitting: Physical Therapy

## 2022-01-30 DIAGNOSIS — M6281 Muscle weakness (generalized): Secondary | ICD-10-CM | POA: Diagnosis not present

## 2022-01-30 DIAGNOSIS — R262 Difficulty in walking, not elsewhere classified: Secondary | ICD-10-CM | POA: Diagnosis not present

## 2022-01-30 DIAGNOSIS — M25552 Pain in left hip: Secondary | ICD-10-CM

## 2022-01-30 DIAGNOSIS — R2681 Unsteadiness on feet: Secondary | ICD-10-CM | POA: Diagnosis not present

## 2022-01-30 DIAGNOSIS — R293 Abnormal posture: Secondary | ICD-10-CM | POA: Diagnosis not present

## 2022-01-30 NOTE — Therapy (Signed)
[facility] °[facility] °[address] °[place], [place], [address] °Phone: [phone]   Fax:  [phone] ° °Physical Therapy Treatment ° °Patient Details  °Name: Carrie Chung °MRN: [id number] °Date of Birth: 03/29/1941 °Referring Provider (PT): Timothy Murphy, MD ° ° °Encounter Date: 01/30/2022 ° ° PT End of Session - 01/30/22 1008   ° ° Visit Number 34   ° Number of Visits 40   ° Date for PT Re-Evaluation 03/29/22   ° Authorization Type MCR   ° Progress Note Due on Visit 30   ° PT Start Time 1010   ° PT Stop Time 1050   ° PT Time Calculation (min) 40 min   ° Activity Tolerance Patient tolerated treatment well   ° Behavior During Therapy WFL for tasks assessed/performed   ° °  °  ° °  ° ° ° °Past Medical History:  °Diagnosis Date  ° Atrial tachycardia ([facility])   ° a. noted at cardiac rehab 5/13; event monitor ordered to assess for AFib  ° CAD (coronary artery disease)   ° a. NSTEMI 03/2012 ([facility] 03/27/12: pLAD 30%, mLAD 99%, mCFX 95%, mRCA 99% with thrombus, EF 65%);  s/p PTCA/DESx1 to prox-mid RCA 03/27/12 urgently in setting of hypotension/bradycardia, with staged PTCA/Evolve study stent to mid LAD & PTCA/Evolve study stent to prox LCx 03/29/12 ;   echo 03/28/12:EF 60%, Aortic sclerosis without AS, mild RVE, mild reduced RVSF    ° Endometrial polyp   ° HTN (hypertension)   ° Hyperlipidemia   ° MI, old   ° Sleep related hypoxia   ° ° °Past Surgical History:  °Procedure Laterality Date  ° APPENDECTOMY  81 years old  ° CAROTID STENT  04/2012  ° x3  ° CESAREAN SECTION  1984  ° CHOLECYSTECTOMY  1978  ° INTRAMEDULLARY (IM) NAIL INTERTROCHANTERIC Left 06/20/2021  ° Procedure: INTRAMEDULLARY (IM) NAIL INTERTROCHANTRIC;  Surgeon: Murphy, Timothy D, MD;  Location: [facility];  Service: Orthopedics;  Laterality: Left;  ° LEFT HEART CATHETERIZATION WITH CORONARY ANGIOGRAM N/A 03/27/2012  ° Procedure: LEFT HEART CATHETERIZATION WITH CORONARY ANGIOGRAM;  Surgeon: Christopher D McAlhany, MD;  Location: [facility]  CATH LAB;  Service: Cardiovascular;  Laterality: N/A;  ° PERCUTANEOUS CORONARY STENT INTERVENTION (PCI-S) N/A 03/27/2012  ° Procedure: PERCUTANEOUS CORONARY STENT INTERVENTION (PCI-S);  Surgeon: Christopher D McAlhany, MD;  Location: [facility] CATH LAB;  Service: Cardiovascular;  Laterality: N/A;  ° PERCUTANEOUS CORONARY STENT INTERVENTION (PCI-S) N/A 03/29/2012  ° Procedure: PERCUTANEOUS CORONARY STENT INTERVENTION (PCI-S);  Surgeon: Michael Cooper, MD;  Location: [facility] CATH LAB;  Service: Cardiovascular;  Laterality: N/A;  ° TONSILLECTOMY  81 years old  ° ° °There were no vitals filed for this visit. ° ° Subjective Assessment - 01/30/22 1009   ° ° Subjective Pt feels some general stiffness today. She was sore after last session but improved by Thursday. She did a lot more walking and being up and about yesterday.   ° Patient Stated Goals Pt wants to be able to stand for longer duration.   ° Currently in Pain? Yes   ° Pain Score 2    ° Pain Location Hip   ° Pain Orientation Left   ° Pain Descriptors / Indicators Sore;Tender   ° Pain Onset More than a month ago   ° °  °  ° °  ° ° ° ° ° ° °Recumbent bike 5 min- L1 seated 2; half turns with transition into full rotations for improvement of hip flexion and ER   position   Attempted quadruped- hip too stiff to reach flexion Seated flexion and ER stretch 8" box under L foot- reach between legs 5s 10x Seated hip flexion/piriformis stretch 5s 10x Sidestepping 38f x2 in hallway STS with ADD ball squeeze 2x10 Step up onto Airex 2x10 L Airex NBOS with reach focus on head hips relationship 4x rounds of 6 reaches  DL mini squats at table with UE support 2x10         PT Short Term Goals - 12/29/21 1126       PT SHORT TERM GOAL #1   Title pt will demo hip flexion, abd and extension strength at least 4/5    Baseline see flowsheet    Status Partially Met      PT SHORT TERM GOAL #2   Title pt will be safe in household ambulation with SPC    Baseline most of the time I  use the cane in the house, I just use the walker to carry things    Status Achieved      PT SHORT TERM GOAL #3   Title pt will be able to navigate stairs with use of hand rail and SPC to go upstairs in her home    Status Achieved               PT Long Term Goals - 12/29/21 1127       PT LONG TERM GOAL #1   Title Pt will be able to play with her grandchildren without limitations    Status Partially Met      PT LONG TERM GOAL #2   Title pt will be able to ambulate for at least 1 hour without AD for functional ambulation    Baseline still using AD consistently,walking about 500 feet from car to pool and I need to sit because of pain and fatigue    Status Partially Met      PT LONG TERM GOAL #3   Title pt will be able to stand and cook large meal without rest break    Baseline 9/27: pt has made breakfast stanidng, 11/15 I made a soup today but had help    Status Partially Met      PT LONG TERM GOAL #4   Title pt will d/c to proper long term program including nustep for cardiac challenges    Status On-going                   Plan - 01/30/22 1110     Clinical Impression Statement Pt able to progress hip flexion and ER ROM tolerance for squatting and bending during ADL as well as improving comfort/confidence with L LE WB. Pt able to introduce pliant surface training today with focus on building L LE propriception and confidence with shifting during ADL simulated reaching. Pt repetitions and intensity of loading increased today. Pt with baseline pain that was not exacerbated during session. Plan to continue with L hip ROM and strength. Pt would benefit from continued skilled therapy in order to reach goals and maximize functional L LE strength and ROM for prevention of future falls.    Examination-Activity Limitations Bathing;Locomotion Level;Bed Mobility;Bend;Sit;Caring for Others;Carry;Sleep;Squat;Stairs;Stand    Examination-Participation Restrictions Meal  Prep;Cleaning;Community Activity    Stability/Clinical Decision Making Stable/Uncomplicated    Rehab Potential Good    PT Frequency 2x / week    PT Duration 8 weeks    PT Treatment/Interventions ADLs/Self Care Home Management;Aquatic Therapy;Cryotherapy;Electrical Stimulation;Gait training;Ultrasound;Traction;Moist Heat;Stair training;Functional mobility  training;Therapeutic activities;Therapeutic exercise;Balance training;Neuromuscular re-education;Manual techniques;Patient/family education;Passive range of motion;Dry needling;Taping    PT Next Visit Plan May benefit from increased land based therapy:  land- gait training, CKC strength and endurance;    PT Home Exercise Plan [id number]    Consulted and Agree with Plan of Care Patient              Patient will benefit from skilled therapeutic intervention in order to improve the following deficits and impairments:  Abnormal gait, Decreased range of motion, Difficulty walking, Decreased activity tolerance, Pain, Improper body mechanics, Impaired flexibility, Decreased balance, Decreased scar mobility, Decreased strength, Postural dysfunction  Visit Diagnosis: Difficulty in walking, not elsewhere classified  Muscle weakness (generalized)  Pain in left hip  Unsteadiness on feet     Problem List Patient Active Problem List   Diagnosis Date Noted   Pain due to onychomycosis of toenails of both feet 08/22/2021   Intertrochanteric fracture of left hip ([place]) 06/24/2021   Closed left hip fracture, initial encounter ([place]) 06/20/2021   Hypokalemia 06/20/2021   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   Cognitive complaints 06/02/2016   Hypoxemic respiratory failure, chronic ([place]) 09/19/2015   MCI (mild cognitive impairment) 03/06/2015   Depression with anxiety 03/06/2015   Bullae 07/02/2014   History of MRSA infection 11/20/2012   Low back pain 07/07/2012   Insomnia 04/28/2012   Coronary Artery Disease 04/19/2012   Hyperlipidemia 04/19/2012    Anxiety 04/19/2012   Non-ST elevation myocardial infarction (NSTEMI), initial care episode ([place]) 03/28/2012   HYPERTENSION, BENIGN 06/12/2010   Dyspnea 06/12/2010   Prolonged QT interval 06/12/2010    Daleen Bo, PT 01/30/2022, 11:13 AM  [facility] [place], [place], [address] Phone: [phone]   Fax:  [phone]  Name: Carrie Chung MRN: [id number] Date of Birth: Apr 12, 1941

## 2022-02-04 ENCOUNTER — Ambulatory Visit (HOSPITAL_BASED_OUTPATIENT_CLINIC_OR_DEPARTMENT_OTHER): Payer: Medicare Other | Admitting: Physical Therapy

## 2022-02-04 ENCOUNTER — Other Ambulatory Visit: Payer: Self-pay

## 2022-02-04 ENCOUNTER — Encounter (HOSPITAL_BASED_OUTPATIENT_CLINIC_OR_DEPARTMENT_OTHER): Payer: Self-pay | Admitting: Physical Therapy

## 2022-02-04 DIAGNOSIS — R293 Abnormal posture: Secondary | ICD-10-CM | POA: Diagnosis not present

## 2022-02-04 DIAGNOSIS — R2681 Unsteadiness on feet: Secondary | ICD-10-CM | POA: Diagnosis not present

## 2022-02-04 DIAGNOSIS — M6281 Muscle weakness (generalized): Secondary | ICD-10-CM

## 2022-02-04 DIAGNOSIS — R262 Difficulty in walking, not elsewhere classified: Secondary | ICD-10-CM

## 2022-02-04 DIAGNOSIS — M25552 Pain in left hip: Secondary | ICD-10-CM | POA: Diagnosis not present

## 2022-02-04 NOTE — Therapy (Signed)
[facility] [address] [place], [place], [address] Phone: [phone]   Fax:  [phone]  Physical Therapy Treatment  Patient Details  Name: Carrie Chung MRN: [id number] Date of Birth: 19-Feb-1941 Referring Provider (PT): Edmonia Lynch, MD   Encounter Date: 02/04/2022   PT End of Session - 02/04/22 1108     Visit Number 35    Number of Visits 40    Date for PT Re-Evaluation 03/29/22    Authorization Type MCR    Progress Note Due on Visit 56    PT Start Time 1100    PT Stop Time 1140    PT Time Calculation (min) 40 min    Activity Tolerance Patient tolerated treatment well    Behavior During Therapy [facility] for tasks assessed/performed             Past Medical History:  Diagnosis Date   Atrial tachycardia ([place])    a. noted at cardiac rehab 5/13; event monitor ordered to assess for AFib   CAD (coronary artery disease)    a. NSTEMI 03/2012 ([place] 03/27/12: pLAD 30%, mLAD 99%, mCFX 95%, mRCA 99% with thrombus, EF 65%);  s/p PTCA/DESx1 to prox-mid RCA 03/27/12 urgently in setting of hypotension/bradycardia, with staged PTCA/Evolve study stent to mid LAD & PTCA/Evolve study stent to prox LCx 03/29/12 ;   echo 03/28/12:EF 60%, Aortic sclerosis without AS, mild RVE, mild reduced RVSF     Endometrial polyp    HTN (hypertension)    Hyperlipidemia    MI, old    Sleep related hypoxia     Past Surgical History:  Procedure Laterality Date   APPENDECTOMY  81 years old   CAROTID STENT  04/2012   x3   CESAREAN SECTION  1984   CHOLECYSTECTOMY  1978   INTRAMEDULLARY (IM) NAIL INTERTROCHANTERIC Left 06/20/2021   Procedure: INTRAMEDULLARY (IM) NAIL INTERTROCHANTRIC;  Surgeon: Renette Butters, MD;  Location: [facility];  Service: Orthopedics;  Laterality: Left;   LEFT HEART CATHETERIZATION WITH CORONARY ANGIOGRAM N/A 03/27/2012   Procedure: LEFT HEART CATHETERIZATION WITH CORONARY ANGIOGRAM;  Surgeon: Burnell Blanks, MD;  Location: [facility]  CATH LAB;  Service: Cardiovascular;  Laterality: N/A;   PERCUTANEOUS CORONARY STENT INTERVENTION (PCI-S) N/A 03/27/2012   Procedure: PERCUTANEOUS CORONARY STENT INTERVENTION (PCI-S);  Surgeon: Burnell Blanks, MD;  Location: [facility] CATH LAB;  Service: Cardiovascular;  Laterality: N/A;   PERCUTANEOUS CORONARY STENT INTERVENTION (PCI-S) N/A 03/29/2012   Procedure: PERCUTANEOUS CORONARY STENT INTERVENTION (PCI-S);  Surgeon: [name], MD;  Location: [facility] CATH LAB;  Service: Cardiovascular;  Laterality: N/A;   TONSILLECTOMY  81 years old    There were no vitals filed for this visit.   Subjective Assessment - 02/04/22 1101     Subjective Pt states that she did not have extra pain or soreness after last session. Pt reports having to sit on a low bleacher at her grandson's basketball game for 45 mins and had increased pain for 3 days after. It has calmed down today.    Patient Stated Goals Pt wants to be able to stand for longer duration.    Currently in Pain? Yes    Pain Score 2     Pain Location Hip    Pain Orientation Left    Pain Descriptors / Indicators Sore    Pain Onset More than a month ago              Recumbent bike 6 min- L1 seated 2; half turns  with transition into full rotations for improvement of hip flexion and ER position     Seated flexion and ER stretch 8" box under L foot- reach between legs 5s 10x Seated hip flexion/piriformis stretch 5s 10x Sidestepping 78f x2 in hallway, one with YTB at knees  STS with ADD ball squeeze 15x, ABD YTB at knees 15x Step up onto Airex 2x10 L Airex NBOS with reach focus on head hips relationship 4x rounds of 6 reaches       PT Education - 02/04/22 1110     Education Details anatomy, exercise progression, muscle firing, HEP    Person(s) Educated Patient    Methods Explanation;Demonstration;Tactile cues;Verbal cues    Comprehension Verbalized understanding;Returned demonstration;Verbal cues required;Tactile cues required               PT Short Term Goals - 12/29/21 1126       PT SHORT TERM GOAL #1   Title pt will demo hip flexion, abd and extension strength at least 4/5    Baseline see flowsheet    Status Partially Met      PT SHORT TERM GOAL #2   Title pt will be safe in household ambulation with SPC    Baseline most of the time I use the cane in the house, I just use the walker to carry things    Status Achieved      PT SHORT TERM GOAL #3   Title pt will be able to navigate stairs with use of hand rail and SPC to go upstairs in her home    Status Achieved               PT Long Term Goals - 12/29/21 1127       PT LONG TERM GOAL #1   Title Pt will be able to play with her grandchildren without limitations    Status Partially Met      PT LONG TERM GOAL #2   Title pt will be able to ambulate for at least 1 hour without AD for functional ambulation    Baseline still using AD consistently,walking about 500 feet from car to pool and I need to sit because of pain and fatigue    Status Partially Met      PT LONG TERM GOAL #3   Title pt will be able to stand and cook large meal without rest break    Baseline 9/27: pt has made breakfast stanidng, 11/15 I made a soup today but had help    Status Partially Met      PT LONG TERM GOAL #4   Title pt will d/c to proper long term program including nustep for cardiac challenges    Status On-going                   Plan - 02/04/22 1143     Clinical Impression Statement Pt with good tolerance to repeat of hip flexion and ER ROM based exercise for squatting and ADL simulation. Pt with improved confidence with pliant surface training as well as able to increase hip loading intensity at today's session without signficant increase in discomfort. Pt presents to sessoin with use of cane due to hip stiffness but able to walk out of session with improved hip ROM and stance time. Plan to continue with L hip ROM and strength. Pt would benefit from  continued skilled therapy in order to reach goals and maximize functional L LE strength and ROM for prevention of  future falls.    Examination-Activity Limitations Bathing;Locomotion Level;Bed Mobility;Bend;Sit;Caring for Others;Carry;Sleep;Squat;Stairs;Stand    Examination-Participation Restrictions Meal Prep;Cleaning;Community Activity    Stability/Clinical Decision Making Stable/Uncomplicated    Rehab Potential Good    PT Frequency 2x / week    PT Duration 8 weeks    PT Treatment/Interventions ADLs/Self Care Home Management;Aquatic Therapy;Cryotherapy;Electrical Stimulation;Gait training;Ultrasound;Traction;Moist Heat;Stair training;Functional mobility training;Therapeutic activities;Therapeutic exercise;Balance training;Neuromuscular re-education;Manual techniques;Patient/family education;Passive range of motion;Dry needling;Taping    PT Next Visit Plan May benefit from increased land based therapy:  land- gait training, CKC strength and endurance;    PT Home Exercise Plan [id number]    Consulted and Agree with Plan of Care Patient             Patient will benefit from skilled therapeutic intervention in order to improve the following deficits and impairments:  Abnormal gait, Decreased range of motion, Difficulty walking, Decreased activity tolerance, Pain, Improper body mechanics, Impaired flexibility, Decreased balance, Decreased scar mobility, Decreased strength, Postural dysfunction  Visit Diagnosis: Difficulty in walking, not elsewhere classified  Muscle weakness (generalized)  Pain in left hip  Unsteadiness on feet  Abnormal posture     Problem List Patient Active Problem List   Diagnosis Date Noted   Pain due to onychomycosis of toenails of both feet 08/22/2021   Intertrochanteric fracture of left hip ([place]) 06/24/2021   Closed left hip fracture, initial encounter ([place]) 06/20/2021   Hypokalemia 06/20/2021   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   Cognitive complaints 06/02/2016    Hypoxemic respiratory failure, chronic ([place]) 09/19/2015   MCI (mild cognitive impairment) 03/06/2015   Depression with anxiety 03/06/2015   Bullae 07/02/2014   History of MRSA infection 11/20/2012   Low back pain 07/07/2012   Insomnia 04/28/2012   Coronary Artery Disease 04/19/2012   Hyperlipidemia 04/19/2012   Anxiety 04/19/2012   Non-ST elevation myocardial infarction (NSTEMI), initial care episode ([place]) 03/28/2012   HYPERTENSION, BENIGN 06/12/2010   Dyspnea 06/12/2010   Prolonged QT interval 06/12/2010    Daleen Bo, PT 02/04/2022, 11:50 AM  [facility] [place], [place], [address] Phone: [phone]   Fax:  [phone]  Name: Carrie Chung MRN: [id number] Date of Birth: 1941-11-24

## 2022-02-06 ENCOUNTER — Encounter (HOSPITAL_BASED_OUTPATIENT_CLINIC_OR_DEPARTMENT_OTHER): Payer: Self-pay | Admitting: Physical Therapy

## 2022-02-06 ENCOUNTER — Ambulatory Visit (HOSPITAL_BASED_OUTPATIENT_CLINIC_OR_DEPARTMENT_OTHER): Payer: Medicare Other | Admitting: Physical Therapy

## 2022-02-06 ENCOUNTER — Other Ambulatory Visit: Payer: Self-pay

## 2022-02-06 DIAGNOSIS — R2681 Unsteadiness on feet: Secondary | ICD-10-CM

## 2022-02-06 DIAGNOSIS — M6281 Muscle weakness (generalized): Secondary | ICD-10-CM

## 2022-02-06 DIAGNOSIS — R262 Difficulty in walking, not elsewhere classified: Secondary | ICD-10-CM | POA: Diagnosis not present

## 2022-02-06 DIAGNOSIS — M25552 Pain in left hip: Secondary | ICD-10-CM | POA: Diagnosis not present

## 2022-02-06 DIAGNOSIS — R293 Abnormal posture: Secondary | ICD-10-CM | POA: Diagnosis not present

## 2022-02-06 NOTE — Therapy (Signed)
[facility] [address] [place], [place], [address] Phone: [phone]   Fax:  [phone]  Physical Therapy Treatment  Patient Details  Name: Carrie Chung MRN: [id number] Date of Birth: May 30, 1941 Referring Provider (PT): Edmonia Lynch, MD   Encounter Date: 02/06/2022   PT End of Session - 02/06/22 1058     Visit Number 36    Number of Visits 66    Date for PT Re-Evaluation 03/29/22    Authorization Type MCR    Progress Note Due on Visit 45    PT Start Time 1100    PT Stop Time 1140    PT Time Calculation (min) 40 min    Activity Tolerance Patient tolerated treatment well    Behavior During Therapy [facility] for tasks assessed/performed             Past Medical History:  Diagnosis Date   Atrial tachycardia ([place])    a. noted at cardiac rehab 5/13; event monitor ordered to assess for AFib   CAD (coronary artery disease)    a. NSTEMI 03/2012 ([place] 03/27/12: pLAD 30%, mLAD 99%, mCFX 95%, mRCA 99% with thrombus, EF 65%);  s/p PTCA/DESx1 to prox-mid RCA 03/27/12 urgently in setting of hypotension/bradycardia, with staged PTCA/Evolve study stent to mid LAD & PTCA/Evolve study stent to prox LCx 03/29/12 ;   echo 03/28/12:EF 60%, Aortic sclerosis without AS, mild RVE, mild reduced RVSF     Endometrial polyp    HTN (hypertension)    Hyperlipidemia    MI, old    Sleep related hypoxia     Past Surgical History:  Procedure Laterality Date   APPENDECTOMY  81 years old   CAROTID STENT  04/2012   x3   CESAREAN SECTION  1984   CHOLECYSTECTOMY  1978   INTRAMEDULLARY (IM) NAIL INTERTROCHANTERIC Left 06/20/2021   Procedure: INTRAMEDULLARY (IM) NAIL INTERTROCHANTRIC;  Surgeon: Renette Butters, MD;  Location: [facility];  Service: Orthopedics;  Laterality: Left;   LEFT HEART CATHETERIZATION WITH CORONARY ANGIOGRAM N/A 03/27/2012   Procedure: LEFT HEART CATHETERIZATION WITH CORONARY ANGIOGRAM;  Surgeon: Burnell Blanks, MD;  Location: [facility]  CATH LAB;  Service: Cardiovascular;  Laterality: N/A;   PERCUTANEOUS CORONARY STENT INTERVENTION (PCI-S) N/A 03/27/2012   Procedure: PERCUTANEOUS CORONARY STENT INTERVENTION (PCI-S);  Surgeon: Burnell Blanks, MD;  Location: [facility] CATH LAB;  Service: Cardiovascular;  Laterality: N/A;   PERCUTANEOUS CORONARY STENT INTERVENTION (PCI-S) N/A 03/29/2012   Procedure: PERCUTANEOUS CORONARY STENT INTERVENTION (PCI-S);  Surgeon: [name], MD;  Location: [facility] CATH LAB;  Service: Cardiovascular;  Laterality: N/A;   TONSILLECTOMY  81 years old    There were no vitals filed for this visit.   Subjective Assessment - 02/06/22 1059     Subjective Pt states she had some quad pain after last session but less of the "semi-circular" L hip pain.    Patient Stated Goals Pt wants to be able to stand for longer duration.    Currently in Pain? No/denies    Pain Score 0-No pain    Pain Onset More than a month ago                  Recumbent bike 6 min- L1 seated 2; half turns with transition into full rotations for improvement of hip flexion and ER position     Seated flexion and ER stretch 8" box under L foot- reach between legs 5s 10x Prone quad stretch 30s 3x Quad and L hip self  massage/rolling technique Sidestepping 39f x2 in hallway, one with YTB at knees  ABD YTB at knees 15x Step up onto Airex 2x10 L - fwd and lateral Tandem balance_ L LE in rear 30s 2x       PT Education - 02/06/22 1107     Education Details anatomy, exercise progression, muscle firing, HEP    Person(s) Educated Patient    Methods Explanation;Demonstration    Comprehension Verbalized understanding;Returned demonstration              PT Short Term Goals - 12/29/21 1126       PT SHORT TERM GOAL #1   Title pt will demo hip flexion, abd and extension strength at least 4/5    Baseline see flowsheet    Status Partially Met      PT SHORT TERM GOAL #2   Title pt will be safe in household ambulation with SPC     Baseline most of the time I use the cane in the house, I just use the walker to carry things    Status Achieved      PT SHORT TERM GOAL #3   Title pt will be able to navigate stairs with use of hand rail and SPC to go upstairs in her home    Status Achieved               PT Long Term Goals - 12/29/21 1127       PT LONG TERM GOAL #1   Title Pt will be able to play with her grandchildren without limitations    Status Partially Met      PT LONG TERM GOAL #2   Title pt will be able to ambulate for at least 1 hour without AD for functional ambulation    Baseline still using AD consistently,walking about 500 feet from car to pool and I need to sit because of pain and fatigue    Status Partially Met      PT LONG TERM GOAL #3   Title pt will be able to stand and cook large meal without rest break    Baseline 9/27: pt has made breakfast stanidng, 11/15 I made a soup today but had help    Status Partially Met      PT LONG TERM GOAL #4   Title pt will d/c to proper long term program including nustep for cardiac challenges    Status On-going                   Plan - 02/06/22 1108     Clinical Impression Statement Pt able to continue with progression of L LE ROM exercise as well as improving CKC tolerance to activity. Pt does present with increased L quad stiffness and tightness that is consistent with increasing intensity of L LE WB and balance activity. Pt was able to introduce balance activity today without adverse reaction but does quickly fatigue with sustained repetition. Plan to continue with progression of ROM and L LE stabilty/motor control at future sessions. Pt would benefit from continued skilled therapy in order to reach goals and maximize functional L LE strength and ROM for prevention of future falls.    Examination-Activity Limitations Bathing;Locomotion Level;Bed Mobility;Bend;Sit;Caring for Others;Carry;Sleep;Squat;Stairs;Stand    Examination-Participation  Restrictions Meal Prep;Cleaning;Community Activity    Stability/Clinical Decision Making Stable/Uncomplicated    Rehab Potential Good    PT Frequency 2x / week    PT Duration 8 weeks    PT Treatment/Interventions ADLs/Self  Care Home Management;Aquatic Therapy;Cryotherapy;Electrical Stimulation;Gait training;Ultrasound;Traction;Moist Heat;Stair training;Functional mobility training;Therapeutic activities;Therapeutic exercise;Balance training;Neuromuscular re-education;Manual techniques;Patient/family education;Passive range of motion;Dry needling;Taping    PT Next Visit Plan May benefit from increased land based therapy:  land- gait training, CKC strength and endurance;    PT Home Exercise Plan [id number]    Consulted and Agree with Plan of Care Patient             Patient will benefit from skilled therapeutic intervention in order to improve the following deficits and impairments:  Abnormal gait, Decreased range of motion, Difficulty walking, Decreased activity tolerance, Pain, Improper body mechanics, Impaired flexibility, Decreased balance, Decreased scar mobility, Decreased strength, Postural dysfunction  Visit Diagnosis: Difficulty in walking, not elsewhere classified  Muscle weakness (generalized)  Pain in left hip  Unsteadiness on feet     Problem List Patient Active Problem List   Diagnosis Date Noted   Pain due to onychomycosis of toenails of both feet 08/22/2021   Intertrochanteric fracture of left hip ([place]) 06/24/2021   Closed left hip fracture, initial encounter ([place]) 06/20/2021   Hypokalemia 06/20/2021   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   Cognitive complaints 06/02/2016   Hypoxemic respiratory failure, chronic ([place]) 09/19/2015   MCI (mild cognitive impairment) 03/06/2015   Depression with anxiety 03/06/2015   Bullae 07/02/2014   History of MRSA infection 11/20/2012   Low back pain 07/07/2012   Insomnia 04/28/2012   Coronary Artery Disease 04/19/2012   Hyperlipidemia  04/19/2012   Anxiety 04/19/2012   Non-ST elevation myocardial infarction (NSTEMI), initial care episode ([place]) 03/28/2012   HYPERTENSION, BENIGN 06/12/2010   Dyspnea 06/12/2010   Prolonged QT interval 06/12/2010    Daleen Bo, PT 02/06/2022, 12:05 PM  [facility] [address] [place], [place], [address] Phone: [phone]   Fax:  [phone]  Name: Carrie Chung MRN: [id number] Date of Birth: 1941-03-13

## 2022-02-11 ENCOUNTER — Ambulatory Visit (HOSPITAL_BASED_OUTPATIENT_CLINIC_OR_DEPARTMENT_OTHER): Payer: Medicare Other | Admitting: Physical Therapy

## 2022-02-11 ENCOUNTER — Other Ambulatory Visit: Payer: Self-pay

## 2022-02-11 ENCOUNTER — Encounter (HOSPITAL_BASED_OUTPATIENT_CLINIC_OR_DEPARTMENT_OTHER): Payer: Self-pay | Admitting: Physical Therapy

## 2022-02-11 DIAGNOSIS — R293 Abnormal posture: Secondary | ICD-10-CM | POA: Diagnosis not present

## 2022-02-11 DIAGNOSIS — M6281 Muscle weakness (generalized): Secondary | ICD-10-CM

## 2022-02-11 DIAGNOSIS — R262 Difficulty in walking, not elsewhere classified: Secondary | ICD-10-CM | POA: Diagnosis not present

## 2022-02-11 DIAGNOSIS — M25552 Pain in left hip: Secondary | ICD-10-CM | POA: Diagnosis not present

## 2022-02-11 DIAGNOSIS — R2681 Unsteadiness on feet: Secondary | ICD-10-CM | POA: Diagnosis not present

## 2022-02-11 NOTE — Therapy (Signed)
[facility] [address] [place], [place], [address] Phone: [phone]   Fax:  [phone]  Physical Therapy Treatment  Patient Details  Name: Carrie Chung MRN: [id number] Date of Birth: 1941-10-06 Referring Provider (PT): Edmonia Lynch, MD   Encounter Date: 02/11/2022   PT End of Session - 02/11/22 1106     Visit Number 37    Number of Visits 40    Date for PT Re-Evaluation 03/29/22    Authorization Type MCR    Progress Note Due on Visit 83    PT Start Time 1100    PT Stop Time 1141    PT Time Calculation (min) 41 min    Activity Tolerance Patient tolerated treatment well    Behavior During Therapy [facility] for tasks assessed/performed             Past Medical History:  Diagnosis Date   Atrial tachycardia ([place])    a. noted at cardiac rehab 5/13; event monitor ordered to assess for AFib   CAD (coronary artery disease)    a. NSTEMI 03/2012 ([place] 03/27/12: pLAD 30%, mLAD 99%, mCFX 95%, mRCA 99% with thrombus, EF 65%);  s/p PTCA/DESx1 to prox-mid RCA 03/27/12 urgently in setting of hypotension/bradycardia, with staged PTCA/Evolve study stent to mid LAD & PTCA/Evolve study stent to prox LCx 03/29/12 ;   echo 03/28/12:EF 60%, Aortic sclerosis without AS, mild RVE, mild reduced RVSF     Endometrial polyp    HTN (hypertension)    Hyperlipidemia    MI, old    Sleep related hypoxia     Past Surgical History:  Procedure Laterality Date   APPENDECTOMY  81 years old   CAROTID STENT  04/2012   x3   CESAREAN SECTION  1984   CHOLECYSTECTOMY  1978   INTRAMEDULLARY (IM) NAIL INTERTROCHANTERIC Left 06/20/2021   Procedure: INTRAMEDULLARY (IM) NAIL INTERTROCHANTRIC;  Surgeon: Renette Butters, MD;  Location: [facility];  Service: Orthopedics;  Laterality: Left;   LEFT HEART CATHETERIZATION WITH CORONARY ANGIOGRAM N/A 03/27/2012   Procedure: LEFT HEART CATHETERIZATION WITH CORONARY ANGIOGRAM;  Surgeon: Burnell Blanks, MD;  Location: [facility]  CATH LAB;  Service: Cardiovascular;  Laterality: N/A;   PERCUTANEOUS CORONARY STENT INTERVENTION (PCI-S) N/A 03/27/2012   Procedure: PERCUTANEOUS CORONARY STENT INTERVENTION (PCI-S);  Surgeon: Burnell Blanks, MD;  Location: [facility] CATH LAB;  Service: Cardiovascular;  Laterality: N/A;   PERCUTANEOUS CORONARY STENT INTERVENTION (PCI-S) N/A 03/29/2012   Procedure: PERCUTANEOUS CORONARY STENT INTERVENTION (PCI-S);  Surgeon: [name], MD;  Location: [facility] CATH LAB;  Service: Cardiovascular;  Laterality: N/A;   TONSILLECTOMY  81 years old    There were no vitals filed for this visit.   Subjective Assessment - 02/11/22 1105     Subjective Pt states that today is a good day. She feels her hip is less tight and she is able to roll to her R in bed easier.    Patient Stated Goals Pt wants to be able to stand for longer duration.    Currently in Pain? No/denies    Pain Score 0-No pain    Pain Onset More than a month ago                Recumbent bike 6 min- L1 seated 2; half turns with transition into full rotations for improvement of hip flexion and ER position     Seated flexion and ER stretch 8" box under L foot- reach between legs 10s 10x Prone quad stretch  30s 3x Stair step lunge 2s 2x10- no UE  Shuttle leg press 25lbs 10x Tandem balance- L LE in rear 30s 2x      PT Short Term Goals - 12/29/21 1126       PT SHORT TERM GOAL #1   Title pt will demo hip flexion, abd and extension strength at least 4/5    Baseline see flowsheet    Status Partially Met      PT SHORT TERM GOAL #2   Title pt will be safe in household ambulation with SPC    Baseline most of the time I use the cane in the house, I just use the walker to carry things    Status Achieved      PT SHORT TERM GOAL #3   Title pt will be able to navigate stairs with use of hand rail and SPC to go upstairs in her home    Status Achieved               PT Long Term Goals - 12/29/21 1127       PT LONG TERM  GOAL #1   Title Pt will be able to play with her grandchildren without limitations    Status Partially Met      PT LONG TERM GOAL #2   Title pt will be able to ambulate for at least 1 hour without AD for functional ambulation    Baseline still using AD consistently,walking about 500 feet from car to pool and I need to sit because of pain and fatigue    Status Partially Met      PT LONG TERM GOAL #3   Title pt will be able to stand and cook large meal without rest break    Baseline 9/27: pt has made breakfast stanidng, 11/15 I made a soup today but had help    Status Partially Met      PT LONG TERM GOAL #4   Title pt will d/c to proper long term program including nustep for cardiac challenges    Status On-going                   Plan - 02/11/22 1115     Clinical Impression Statement Pt able to perform more L LE loaded exercise at today's session at greater ranges of knee and hip flexion. Pt does have signficant weakess while perform shuttle leg press with difficulty with concentric rate of force development. Pt's tolerance of CKC loading is improving but continues to be limited by L hip joint ER and flexion. Potentially redo manual prior to leg pressing at next session. Pt would benefit from continued skilled therapy in order to reach goals and maximize functional L LE strength and ROM for prevention of future falls.    Examination-Activity Limitations Bathing;Locomotion Level;Bed Mobility;Bend;Sit;Caring for Others;Carry;Sleep;Squat;Stairs;Stand    Examination-Participation Restrictions Meal Prep;Cleaning;Community Activity    Stability/Clinical Decision Making Stable/Uncomplicated    Rehab Potential Good    PT Frequency 2x / week    PT Duration 8 weeks    PT Treatment/Interventions ADLs/Self Care Home Management;Aquatic Therapy;Cryotherapy;Electrical Stimulation;Gait training;Ultrasound;Traction;Moist Heat;Stair training;Functional mobility training;Therapeutic  activities;Therapeutic exercise;Balance training;Neuromuscular re-education;Manual techniques;Patient/family education;Passive range of motion;Dry needling;Taping    PT Home Exercise Plan [id number]    Consulted and Agree with Plan of Care Patient             Patient will benefit from skilled therapeutic intervention in order to improve the following deficits and impairments:  Abnormal gait,  Decreased range of motion, Difficulty walking, Decreased activity tolerance, Pain, Improper body mechanics, Impaired flexibility, Decreased balance, Decreased scar mobility, Decreased strength, Postural dysfunction  Visit Diagnosis: Difficulty in walking, not elsewhere classified  Muscle weakness (generalized)  Pain in left hip  Unsteadiness on feet     Problem List Patient Active Problem List   Diagnosis Date Noted   Pain due to onychomycosis of toenails of both feet 08/22/2021   Intertrochanteric fracture of left hip ([place]) 06/24/2021   Closed left hip fracture, initial encounter ([place]) 06/20/2021   Hypokalemia 06/20/2021   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   Cognitive complaints 06/02/2016   Hypoxemic respiratory failure, chronic ([place]) 09/19/2015   MCI (mild cognitive impairment) 03/06/2015   Depression with anxiety 03/06/2015   Bullae 07/02/2014   History of MRSA infection 11/20/2012   Low back pain 07/07/2012   Insomnia 04/28/2012   Coronary Artery Disease 04/19/2012   Hyperlipidemia 04/19/2012   Anxiety 04/19/2012   Non-ST elevation myocardial infarction (NSTEMI), initial care episode ([place]) 03/28/2012   HYPERTENSION, BENIGN 06/12/2010   Dyspnea 06/12/2010   Prolonged QT interval 06/12/2010    Daleen Bo, PT 02/11/2022, 12:26 PM  [facility] [address] [place], [place], [address] Phone: [phone]   Fax:  [phone]  Name: Carrie Chung MRN: [id number] Date of Birth: 1941-12-10

## 2022-02-13 ENCOUNTER — Ambulatory Visit (HOSPITAL_BASED_OUTPATIENT_CLINIC_OR_DEPARTMENT_OTHER): Payer: Medicare Other | Admitting: Physical Therapy

## 2022-02-13 ENCOUNTER — Encounter (HOSPITAL_BASED_OUTPATIENT_CLINIC_OR_DEPARTMENT_OTHER): Payer: Self-pay | Admitting: Physical Therapy

## 2022-02-13 ENCOUNTER — Other Ambulatory Visit: Payer: Self-pay

## 2022-02-13 DIAGNOSIS — M6281 Muscle weakness (generalized): Secondary | ICD-10-CM | POA: Diagnosis not present

## 2022-02-13 DIAGNOSIS — M25552 Pain in left hip: Secondary | ICD-10-CM

## 2022-02-13 DIAGNOSIS — R293 Abnormal posture: Secondary | ICD-10-CM | POA: Diagnosis not present

## 2022-02-13 DIAGNOSIS — R2681 Unsteadiness on feet: Secondary | ICD-10-CM

## 2022-02-13 DIAGNOSIS — R262 Difficulty in walking, not elsewhere classified: Secondary | ICD-10-CM

## 2022-02-13 NOTE — Therapy (Signed)
[facility] [place], [place], [address] Phone: [phone]   Fax:  [phone]  Physical Therapy Treatment  Patient Details  Name: Carrie Chung MRN: [id number] Date of Birth: [birth year] Referring Provider (PT): Edmonia Lynch, MD   Encounter Date: 02/13/2022   PT End of Session - 02/13/22 1110     Visit Number 23    Number of Visits 40    Date for PT Re-Evaluation 03/29/22    Authorization Type MCR    PT Start Time 1107    PT Stop Time 1147    PT Time Calculation (min) 40 min    Activity Tolerance Patient tolerated treatment well    Behavior During Therapy [facility] for tasks assessed/performed             Past Medical History:  Diagnosis Date   Atrial tachycardia ([place])    a. noted at cardiac rehab 5/13; event monitor ordered to assess for AFib   CAD (coronary artery disease)    a. NSTEMI 03/2012 ([place] 03/27/12: pLAD 30%, mLAD 99%, mCFX 95%, mRCA 99% with thrombus, EF 65%);  s/p PTCA/DESx1 to prox-mid RCA 03/27/12 urgently in setting of hypotension/bradycardia, with staged PTCA/Evolve study stent to mid LAD & PTCA/Evolve study stent to prox LCx 03/29/12 ;   echo 03/28/12:EF 60%, Aortic sclerosis without AS, mild RVE, mild reduced RVSF     Endometrial polyp    HTN (hypertension)    Hyperlipidemia    MI, old    Sleep related hypoxia     Past Surgical History:  Procedure Laterality Date   APPENDECTOMY  81 years old   CAROTID STENT  04/2012   x3   CESAREAN SECTION  1984   CHOLECYSTECTOMY  1978   INTRAMEDULLARY (IM) NAIL INTERTROCHANTERIC Left 06/20/2021   Procedure: INTRAMEDULLARY (IM) NAIL INTERTROCHANTRIC;  Surgeon: Renette Butters, MD;  Location: [facility];  Service: Orthopedics;  Laterality: Left;   LEFT HEART CATHETERIZATION WITH CORONARY ANGIOGRAM N/A 03/27/2012   Procedure: LEFT HEART CATHETERIZATION WITH CORONARY ANGIOGRAM;  Surgeon: Burnell Blanks, MD;  Location: [facility] CATH LAB;  Service: Cardiovascular;   Laterality: N/A;   PERCUTANEOUS CORONARY STENT INTERVENTION (PCI-S) N/A 03/27/2012   Procedure: PERCUTANEOUS CORONARY STENT INTERVENTION (PCI-S);  Surgeon: Burnell Blanks, MD;  Location: [facility] CATH LAB;  Service: Cardiovascular;  Laterality: N/A;   PERCUTANEOUS CORONARY STENT INTERVENTION (PCI-S) N/A 03/29/2012   Procedure: PERCUTANEOUS CORONARY STENT INTERVENTION (PCI-S);  Surgeon: [name], MD;  Location: [facility] CATH LAB;  Service: Cardiovascular;  Laterality: N/A;   TONSILLECTOMY  81 years old    There were no vitals filed for this visit.   Subjective Assessment - 02/13/22 1110     Subjective Pt states she had increased pain after a couple of hours after prior Rx and used ice which helped.  Pt states she did a new exercise like a leg press.  Pt states she had pain the following day also.  Pt reports improved ambulation and mobility.  Pt states she forgets her cane more and walks more in home without AD.    Currently in Pain? Yes    Pain Score 2     Pain Orientation --   L anterior hip and thigh            OBJECTIVE: Reviewed current function, pain level, and response to prior Rx. Pt performed: -Recumbent bike 6 min- L1 seated 3; half turns with transition into full rotations   -Seated flexion stretch reach between  legs 5s 10x -Sidestepping 38f x2 in hallway, one with YTB at knees  -Seated ABD YTB at knees 15x -Step up onto Airex 2x10 L - fwd  -Mini squats with bilat Ue's on table -Tandem balance_ L LE in rear 30s 2x -Standing on Airex with NBOS with fwd reaches                            PT Short Term Goals - 12/29/21 1126       PT SHORT TERM GOAL #1   Title pt will demo hip flexion, abd and extension strength at least 4/5    Baseline see flowsheet    Status Partially Met      PT SHORT TERM GOAL #2   Title pt will be safe in household ambulation with SPC    Baseline most of the time I use the cane in the house, I just use the walker to  carry things    Status Achieved      PT SHORT TERM GOAL #3   Title pt will be able to navigate stairs with use of hand rail and SPC to go upstairs in her home    Status Achieved               PT Long Term Goals - 12/29/21 1127       PT LONG TERM GOAL #1   Title Pt will be able to play with her grandchildren without limitations    Status Partially Met      PT LONG TERM GOAL #2   Title pt will be able to ambulate for at least 1 hour without AD for functional ambulation    Baseline still using AD consistently,walking about 500 feet from car to pool and I need to sit because of pain and fatigue    Status Partially Met      PT LONG TERM GOAL #3   Title pt will be able to stand and cook large meal without rest break    Baseline 9/27: pt has made breakfast stanidng, 11/15 I made a soup today but had help    Status Partially Met      PT LONG TERM GOAL #4   Title pt will d/c to proper long term program including nustep for cardiac challenges    Status On-going                   Plan - 02/13/22 1750     Clinical Impression Statement Pt reports having increased pain after prior Rx and reports performing a new exercises, the shuttle leg press.  PT held on that exercise today.  Pt is improving with LE strength as evidenced by progressing with closed chain exercises.  Pt did not use hands on the wall when performing sidestepping without band though had increased difficulty when performing with YTB.  Pt had occasional LOB and had to use the wall intermittently when performing sidestepping with YTB.  Pt required cuing for correct form with mini squats.  Pt responded well to Rx stating she felt better after Rx than before Rx.  Pt should benefit from cont skilled PT in order to address ongoing goals and to restore desired level of function.    PT Treatment/Interventions ADLs/Self Care Home Management;Aquatic Therapy;Cryotherapy;Electrical Stimulation;Gait  training;Ultrasound;Traction;Moist Heat;Stair training;Functional mobility training;Therapeutic activities;Therapeutic exercise;Balance training;Neuromuscular re-education;Manual techniques;Patient/family education;Passive range of motion;Dry needling;Taping    PT Next Visit Plan Cont with ther ex, proprioceptive  activities, and flexibility.    PT Home Exercise Plan [id number]    Consulted and Agree with Plan of Care Patient             Patient will benefit from skilled therapeutic intervention in order to improve the following deficits and impairments:  Abnormal gait, Decreased range of motion, Difficulty walking, Decreased activity tolerance, Pain, Improper body mechanics, Impaired flexibility, Decreased balance, Decreased scar mobility, Decreased strength, Postural dysfunction  Visit Diagnosis: Difficulty in walking, not elsewhere classified  Muscle weakness (generalized)  Pain in left hip  Unsteadiness on feet     Problem List Patient Active Problem List   Diagnosis Date Noted   Pain due to onychomycosis of toenails of both feet 08/22/2021   Intertrochanteric fracture of left hip ([place]) 06/24/2021   Closed left hip fracture, initial encounter ([place]) 06/20/2021   Hypokalemia 06/20/2021   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   Cognitive complaints 06/02/2016   Hypoxemic respiratory failure, chronic ([place]) 09/19/2015   MCI (mild cognitive impairment) 03/06/2015   Depression with anxiety 03/06/2015   Bullae 07/02/2014   History of MRSA infection 11/20/2012   Low back pain 07/07/2012   Insomnia 04/28/2012   Coronary Artery Disease 04/19/2012   Hyperlipidemia 04/19/2012   Anxiety 04/19/2012   Non-ST elevation myocardial infarction (NSTEMI), initial care episode ([place]) 03/28/2012   HYPERTENSION, BENIGN 06/12/2010   Dyspnea 06/12/2010   Prolonged QT interval 06/12/2010    Selinda Michaels III PT, DPT 02/13/22 6:01 PM   [facility] [place], [place], [address] Phone: [phone]   Fax:  [phone]  Name: Carrie Chung MRN: [id number] Date of Birth: 1940-12-28

## 2022-02-17 ENCOUNTER — Other Ambulatory Visit: Payer: Self-pay

## 2022-02-17 ENCOUNTER — Ambulatory Visit (HOSPITAL_BASED_OUTPATIENT_CLINIC_OR_DEPARTMENT_OTHER): Payer: Medicare Other | Admitting: Physical Therapy

## 2022-02-17 ENCOUNTER — Encounter (HOSPITAL_BASED_OUTPATIENT_CLINIC_OR_DEPARTMENT_OTHER): Payer: Self-pay | Admitting: Physical Therapy

## 2022-02-17 DIAGNOSIS — R262 Difficulty in walking, not elsewhere classified: Secondary | ICD-10-CM

## 2022-02-17 DIAGNOSIS — M6281 Muscle weakness (generalized): Secondary | ICD-10-CM

## 2022-02-17 DIAGNOSIS — M25552 Pain in left hip: Secondary | ICD-10-CM | POA: Diagnosis not present

## 2022-02-17 DIAGNOSIS — R2681 Unsteadiness on feet: Secondary | ICD-10-CM

## 2022-02-17 DIAGNOSIS — R293 Abnormal posture: Secondary | ICD-10-CM | POA: Diagnosis not present

## 2022-02-17 NOTE — Therapy (Signed)
[facility] [address] [place], [place], [address] Phone: [phone]   Fax:  [phone]  Physical Therapy Treatment  Patient Details  Name: Carrie Chung MRN: [id number] Date of Birth: Aug 25, 1941 Referring Provider (PT): Edmonia Lynch, MD   Encounter Date: 02/17/2022   PT End of Session - 02/17/22 1116     Visit Number 47    Number of Visits 40    Date for PT Re-Evaluation 03/29/22    Authorization Type MCR    PT Start Time 1100    PT Stop Time 1140    PT Time Calculation (min) 40 min    Activity Tolerance Patient tolerated treatment well    Behavior During Therapy [facility] for tasks assessed/performed             Past Medical History:  Diagnosis Date   Atrial tachycardia ([place])    a. noted at cardiac rehab 5/13; event monitor ordered to assess for AFib   CAD (coronary artery disease)    a. NSTEMI 03/2012 ([place] 03/27/12: pLAD 30%, mLAD 99%, mCFX 95%, mRCA 99% with thrombus, EF 65%);  s/p PTCA/DESx1 to prox-mid RCA 03/27/12 urgently in setting of hypotension/bradycardia, with staged PTCA/Evolve study stent to mid LAD & PTCA/Evolve study stent to prox LCx 03/29/12 ;   echo 03/28/12:EF 60%, Aortic sclerosis without AS, mild RVE, mild reduced RVSF     Endometrial polyp    HTN (hypertension)    Hyperlipidemia    MI, old    Sleep related hypoxia     Past Surgical History:  Procedure Laterality Date   APPENDECTOMY  81 years old   CAROTID STENT  04/2012   x3   CESAREAN SECTION  1984   CHOLECYSTECTOMY  1978   INTRAMEDULLARY (IM) NAIL INTERTROCHANTERIC Left 06/20/2021   Procedure: INTRAMEDULLARY (IM) NAIL INTERTROCHANTRIC;  Surgeon: Renette Butters, MD;  Location: [facility];  Service: Orthopedics;  Laterality: Left;   LEFT HEART CATHETERIZATION WITH CORONARY ANGIOGRAM N/A 03/27/2012   Procedure: LEFT HEART CATHETERIZATION WITH CORONARY ANGIOGRAM;  Surgeon: Burnell Blanks, MD;  Location: [facility] CATH LAB;  Service: Cardiovascular;   Laterality: N/A;   PERCUTANEOUS CORONARY STENT INTERVENTION (PCI-S) N/A 03/27/2012   Procedure: PERCUTANEOUS CORONARY STENT INTERVENTION (PCI-S);  Surgeon: Burnell Blanks, MD;  Location: [facility] CATH LAB;  Service: Cardiovascular;  Laterality: N/A;   PERCUTANEOUS CORONARY STENT INTERVENTION (PCI-S) N/A 03/29/2012   Procedure: PERCUTANEOUS CORONARY STENT INTERVENTION (PCI-S);  Surgeon: [name], MD;  Location: [facility] CATH LAB;  Service: Cardiovascular;  Laterality: N/A;   TONSILLECTOMY  81 years old    There were no vitals filed for this visit.   Subjective Assessment - 02/17/22 1104     Subjective Pt states the quad is sore/tired today. Pt states that the L hip is sore posteriorly.    Currently in Pain? Yes    Pain Score 2     Pain Location Hip    Pain Descriptors / Indicators Sore;Aching               OBJECTIVE: Manual therapy: Mulligan mobilizations L hip- inf and lateral grade III  Pt performed:  Supine bridge 2x10 -Recumbent bike 4 min- L1 seated 3; half turns with transition into full rotations   -Seated flexion stretch reach between legs 5s 10x L foot on 8" box -LAQ 4lbs 2x10 -standing march 2x10 (high knees) -Standing HS curls 1lb 2x10 -Step up onto Airex 2x10 L - fwd and lateral  PT Short Term Goals - 12/29/21 1126       PT SHORT TERM GOAL #1   Title pt will demo hip flexion, abd and extension strength at least 4/5    Baseline see flowsheet    Status Partially Met      PT SHORT TERM GOAL #2   Title pt will be safe in household ambulation with SPC    Baseline most of the time I use the cane in the house, I just use the walker to carry things    Status Achieved      PT SHORT TERM GOAL #3   Title pt will be able to navigate stairs with use of hand rail and SPC to go upstairs in her home    Status Achieved               PT Long Term Goals - 12/29/21 1127       PT LONG TERM GOAL #1   Title Pt will be able to play with her  grandchildren without limitations    Status Partially Met      PT LONG TERM GOAL #2   Title pt will be able to ambulate for at least 1 hour without AD for functional ambulation    Baseline still using AD consistently,walking about 500 feet from car to pool and I need to sit because of pain and fatigue    Status Partially Met      PT LONG TERM GOAL #3   Title pt will be able to stand and cook large meal without rest break    Baseline 9/27: pt has made breakfast stanidng, 11/15 I made a soup today but had help    Status Partially Met      PT LONG TERM GOAL #4   Title pt will d/c to proper long term program including nustep for cardiac challenges    Status On-going                   Plan - 02/17/22 1119     Clinical Impression Statement Pt with improved hip flexion and stance time tolerance on L LE at end of session. Pt responded well to joint mobilizations after complaint of L posterior hip tightness and discomfort. Pt is still largely stiffness and extension strength limited at this time. Plan to perform re-certification at next session as pt continues to be limited with functional mobility and balance tasks. Pt would benefit from continued skilled therapy in order to reach goals and maximize functional L LE strength and ROM for prevention of future falls.    Examination-Activity Limitations Bathing;Locomotion Level;Bed Mobility;Bend;Sit;Caring for Others;Carry;Sleep;Squat;Stairs;Stand    Examination-Participation Restrictions Meal Prep;Cleaning;Community Activity    PT Treatment/Interventions ADLs/Self Care Home Management;Aquatic Therapy;Cryotherapy;Electrical Stimulation;Gait training;Ultrasound;Traction;Moist Heat;Stair training;Functional mobility training;Therapeutic activities;Therapeutic exercise;Balance training;Neuromuscular re-education;Manual techniques;Patient/family education;Passive range of motion;Dry needling;Taping    PT Next Visit Plan Cont with ther ex,  proprioceptive activities, and flexibility.    PT Home Exercise Plan [id number]    Consulted and Agree with Plan of Care Patient             Patient will benefit from skilled therapeutic intervention in order to improve the following deficits and impairments:  Abnormal gait, Decreased range of motion, Difficulty walking, Decreased activity tolerance, Pain, Improper body mechanics, Impaired flexibility, Decreased balance, Decreased scar mobility, Decreased strength, Postural dysfunction  Visit Diagnosis: Difficulty in walking, not elsewhere classified  Muscle weakness (generalized)  Pain in left hip  Unsteadiness on feet  Problem List Patient Active Problem List   Diagnosis Date Noted   Pain due to onychomycosis of toenails of both feet 08/22/2021   Intertrochanteric fracture of left hip ([place]) 06/24/2021   Closed left hip fracture, initial encounter ([place]) 06/20/2021   Hypokalemia 06/20/2021   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   Cognitive complaints 06/02/2016   Hypoxemic respiratory failure, chronic ([place]) 09/19/2015   MCI (mild cognitive impairment) 03/06/2015   Depression with anxiety 03/06/2015   Bullae 07/02/2014   History of MRSA infection 11/20/2012   Low back pain 07/07/2012   Insomnia 04/28/2012   Coronary Artery Disease 04/19/2012   Hyperlipidemia 04/19/2012   Anxiety 04/19/2012   Non-ST elevation myocardial infarction (NSTEMI), initial care episode ([place]) 03/28/2012   HYPERTENSION, BENIGN 06/12/2010   Dyspnea 06/12/2010   Prolonged QT interval 06/12/2010    Daleen Bo, PT 02/17/2022, 12:19 PM  [facility] [place], [place], [address] Phone: [phone]   Fax:  [phone]  Name: SHEWANDA SHARPE MRN: [id number] Date of Birth: 03-29-1941

## 2022-02-23 ENCOUNTER — Ambulatory Visit (HOSPITAL_BASED_OUTPATIENT_CLINIC_OR_DEPARTMENT_OTHER): Payer: Medicare Other | Attending: Physical Medicine and Rehabilitation | Admitting: Physical Therapy

## 2022-02-23 ENCOUNTER — Telehealth: Payer: Self-pay

## 2022-02-23 ENCOUNTER — Encounter (HOSPITAL_BASED_OUTPATIENT_CLINIC_OR_DEPARTMENT_OTHER): Payer: Self-pay | Admitting: Physical Therapy

## 2022-02-23 ENCOUNTER — Other Ambulatory Visit: Payer: Self-pay

## 2022-02-23 DIAGNOSIS — M25552 Pain in left hip: Secondary | ICD-10-CM | POA: Diagnosis not present

## 2022-02-23 DIAGNOSIS — R2681 Unsteadiness on feet: Secondary | ICD-10-CM | POA: Insufficient documentation

## 2022-02-23 DIAGNOSIS — R262 Difficulty in walking, not elsewhere classified: Secondary | ICD-10-CM | POA: Insufficient documentation

## 2022-02-23 DIAGNOSIS — M6281 Muscle weakness (generalized): Secondary | ICD-10-CM | POA: Diagnosis not present

## 2022-02-23 NOTE — Telephone Encounter (Signed)
Patient called asking for referral for outpatient rehab, for physical therapy. She states that she has been going to outpt rehab at [facility] ordered by one doctor that she no longer sees so it needs to come from you now. She has been advised she may need an appt for referral.  ?

## 2022-02-23 NOTE — Therapy (Signed)
[place], [place], [address] Phone: [phone]   Fax:  [phone]  Physical Therapy Re-Certification  Patient Details  Name: Carrie Chung MRN: [id number] Date of Birth: 09-Apr-1941 Referring Provider (PT): Edmonia Lynch, MD   Encounter Date: 02/23/2022   PT End of Session - 02/23/22 9643     Visit Number 40    Number of Visits 27    Date for PT Re-Evaluation 05/24/22    Authorization Type MCR    PT Start Time 1305    PT Stop Time 1345    PT Time Calculation (min) 40 min    Activity Tolerance Patient tolerated treatment well    Behavior During Therapy [facility] for tasks assessed/performed             Past Medical History:  Diagnosis Date   Atrial tachycardia ([place])    a. noted at cardiac rehab 5/13; event monitor ordered to assess for AFib   CAD (coronary artery disease)    a. NSTEMI 03/2012 ([place] 03/27/12: pLAD 30%, mLAD 99%, mCFX 95%, mRCA 99% with thrombus, EF 65%);  s/p PTCA/DESx1 to prox-mid RCA 03/27/12 urgently in setting of hypotension/bradycardia, with staged PTCA/Evolve study stent to mid LAD & PTCA/Evolve study stent to prox LCx 03/29/12 ;   echo 03/28/12:EF 60%, Aortic sclerosis without AS, mild RVE, mild reduced RVSF     Endometrial polyp    HTN (hypertension)    Hyperlipidemia    MI, old    Sleep related hypoxia     Past Surgical History:  Procedure Laterality Date   APPENDECTOMY  81 years old   CAROTID STENT  04/2012   x3   CESAREAN SECTION  1984   CHOLECYSTECTOMY  1978   INTRAMEDULLARY (IM) NAIL INTERTROCHANTERIC Left 06/20/2021   Procedure: INTRAMEDULLARY (IM) NAIL INTERTROCHANTRIC;  Surgeon: Renette Butters, MD;  Location: [facility];  Service: Orthopedics;  Laterality: Left;   LEFT HEART CATHETERIZATION WITH CORONARY ANGIOGRAM N/A 03/27/2012   Procedure: LEFT HEART CATHETERIZATION WITH CORONARY ANGIOGRAM;  Surgeon: Burnell Blanks, MD;  Location: [facility] CATH LAB;  Service:  Cardiovascular;  Laterality: N/A;   PERCUTANEOUS CORONARY STENT INTERVENTION (PCI-S) N/A 03/27/2012   Procedure: PERCUTANEOUS CORONARY STENT INTERVENTION (PCI-S);  Surgeon: Burnell Blanks, MD;  Location: [facility] CATH LAB;  Service: Cardiovascular;  Laterality: N/A;   PERCUTANEOUS CORONARY STENT INTERVENTION (PCI-S) N/A 03/29/2012   Procedure: PERCUTANEOUS CORONARY STENT INTERVENTION (PCI-S);  Surgeon: [name], MD;  Location: [facility] CATH LAB;  Service: Cardiovascular;  Laterality: N/A;   TONSILLECTOMY  81 years old    There were no vitals filed for this visit.   Subjective Assessment - 02/23/22 1308     Subjective Pt states her hip is moving better today. She states that she is forgetting the cane more and more around the house. She is much better laying on the R side in bed now.                [facility] PT Assessment - 02/23/22 0001       Assessment   Medical Diagnosis s/p IM Nail Lt femur, closed reduction and manipultion of Lt hip fx    Referring Provider (PT) Edmonia Lynch, MD    Onset Date/Surgical Date 06/20/21    Prior Therapy inpatient and home health      Precautions   Precautions Fall      Balance Screen   Has the patient fallen in the past 6 months No  Has the patient had a decrease in activity level because of a fear of falling?  No    Is the patient reluctant to leave their home because of a fear of falling?  No      Prior Function   Vocation Requirements play/care for grand children, cooking      Observation/Other Assessments   Other Surveys  Lower Extremity Functional Scale   Lower Extremity Functional Score: 29 / 80 = 36.3 %   Lower Extremity Functional Scale  Lower Extremity Functional Score: 29 / 80 = 36.3 %      AROM   Overall AROM Comments flex: 105, ABD 10, IR 10, ER 25, ext 0      PROM   Overall PROM Comments flex 117, ER 35, IR 20, ABD 20, ext 8      Strength   Overall Strength Comments 4+/5 flexion, 4/5 with ABD, ADD and ext      Transfers    Five time sit to stand comments  12.4s   No UE   Comments Able to now lay on R side; unable to previous      Ambulation/Gait   Ambulation Distance (Feet) 877 Feet    Assistive device Straight cane    Gait Comments antalgic, decreased L LE stance time, decreased hip extension   15f MCID     6 Minute Walk- Baseline   6 Minute Walk- Baseline yes           OBJECTIVE: Manual therapy: Mulligan mobilizations L hip- inf and lateral grade III  Pt performed:  Supine bridge 2x10 -Recumbent bike 4 min- L1 seated 3; half turns with transition into full rotations   -Seated flexion stretch reach between legs 5s 10x L foot on 8" box -LAQ 4lbs 2x10 -standing march 2x10 (high knees) -Standing HS curls 1lb 2x10 -Step up onto Airex 2x10 L - fwd and lateral        PT Education - 02/23/22 1311     Education Details exam findings, exercise progression, HEP    Person(s) Educated Patient    Methods Explanation;Demonstration    Comprehension Returned demonstration;Verbalized understanding              PT Short Term Goals - 02/23/22 1312       PT SHORT TERM GOAL #1   Title pt will demo hip flexion, abd and extension strength at least 4/5    Baseline see flowsheet    Time 4    Period Weeks    Status Achieved      PT SHORT TERM GOAL #2   Title pt will be safe in household ambulation without SPC.    Baseline --    Time 4    Period Weeks    Status New      PT SHORT TERM GOAL #3   Title pt will be able to navigate stairs with use of hand rail and no AD to go upstairs in her home    Time 4    Period Weeks    Status New               PT Long Term Goals - 02/23/22 1312       PT LONG TERM GOAL #1   Title Pt will be able to play with her grandchildren without limitations    Time 8    Period Weeks    Status Partially Met      PT LONG TERM GOAL #2   Title  pt will be able to ambulate for at least 1 hour without AD for functional ambulation    Baseline still using AD  consistently,walking about 500 feet from car to pool and I need to sit because of pain and fatigue    Time 8    Period Weeks    Status Partially Met      PT LONG TERM GOAL #3   Title pt will be able to stand and cook large meal without rest break    Baseline able to stand 45 mins    Time 8    Period Weeks    Status Achieved      PT LONG TERM GOAL #4   Title pt will d/c to proper long term program including nustep for cardiac challenges    Time 8    Period Weeks    Status On-going      PT LONG TERM GOAL #5   Title Pt will be able to demonstrate/report ability to walk >20 mins without pain in order to demonstrate functional improvement and tolerance to exercise and community mobility.    Time 8    Period Weeks    Status New                   Plan - 02/23/22 1312     Clinical Impression Statement Pt demonstrates significant functional improvements with mobility, transfers, strength, and endurance as demonstrated by objective measures. Pt's has signficant reduced 5XSTS time as well as need for assist during walking testing. Pt however is stil pain limited with hip ROM, likely able to reach further without lateral hip pain and sensitivity. Plan to continue with L LE strenght, ROM, and functional mobility so she is able to return to full ADL and caregiver duties. Pt would benefit from continued skilled therapy in order to reach goals and maximize functional L LE strength and ROM for prevention of future falls.    Examination-Activity Limitations Bathing;Locomotion Level;Bed Mobility;Bend;Sit;Caring for Others;Carry;Sleep;Squat;Stairs;Stand    Examination-Participation Restrictions Meal Prep;Cleaning;Community Activity    Rehab Potential Good    PT Frequency 2x / week    PT Duration 8 weeks    PT Treatment/Interventions ADLs/Self Care Home Management;Aquatic Therapy;Cryotherapy;Electrical Stimulation;Gait training;Ultrasound;Traction;Moist Heat;Stair training;Functional mobility  training;Therapeutic activities;Therapeutic exercise;Balance training;Neuromuscular re-education;Manual techniques;Patient/family education;Passive range of motion;Dry needling;Taping    PT Next Visit Plan Cont with ther ex, proprioceptive activities, and flexibility.    PT Home Exercise Plan [id number]    Consulted and Agree with Plan of Care Patient             Patient will benefit from skilled therapeutic intervention in order to improve the following deficits and impairments:  Abnormal gait, Decreased range of motion, Difficulty walking, Decreased activity tolerance, Pain, Improper body mechanics, Impaired flexibility, Decreased balance, Decreased scar mobility, Decreased strength, Postural dysfunction  Visit Diagnosis: Difficulty in walking, not elsewhere classified - Plan: PT plan of care cert/re-cert  Muscle weakness (generalized) - Plan: PT plan of care cert/re-cert  Pain in left hip - Plan: PT plan of care cert/re-cert  Unsteadiness on feet - Plan: PT plan of care cert/re-cert     Problem List Patient Active Problem List   Diagnosis Date Noted   Pain due to onychomycosis of toenails of both feet 08/22/2021   Intertrochanteric fracture of left hip ([place]) 06/24/2021   Closed left hip fracture, initial encounter ([place]) 06/20/2021   Hypokalemia 06/20/2021   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   Cognitive complaints 06/02/2016   Hypoxemic respiratory  failure, chronic ([place]) 09/19/2015   MCI (mild cognitive impairment) 03/06/2015   Depression with anxiety 03/06/2015   Bullae 07/02/2014   History of MRSA infection 11/20/2012   Low back pain 07/07/2012   Insomnia 04/28/2012   Coronary Artery Disease 04/19/2012   Hyperlipidemia 04/19/2012   Anxiety 04/19/2012   Non-ST elevation myocardial infarction (NSTEMI), initial care episode ([place]) 03/28/2012   HYPERTENSION, BENIGN 06/12/2010   Dyspnea 06/12/2010   Prolonged QT interval 06/12/2010    Daleen Bo, PT 02/23/2022, 1:57 PM  [facility] [place], [place], [address] Phone: [phone]   Fax:  [phone]  Name: Carrie Chung MRN: [id number] Date of Birth: Feb 01, 1941

## 2022-02-24 NOTE — Telephone Encounter (Signed)
It was recertified yesterday. She has visits remaining and visits scheduled. They do not know why the patient contacted [place] but they are going to call and talk to her.  ?

## 2022-02-24 NOTE — Telephone Encounter (Signed)
LMOM for [facility] to call back to see what was needed.  ?

## 2022-02-24 NOTE — Telephone Encounter (Signed)
Rehab called back and we looked at the PT together and DR Edmonia Lynch is the doctor who ordered the physical therapy. So they are going to let patient know that is who will need to re certify the PT to continue. ?

## 2022-02-24 NOTE — Telephone Encounter (Signed)
Talked with Mekayla first thing this morning and she was also going to talk with surgeon about referral for PT. It looks like Dr Percell Miller is who original order it. ?

## 2022-02-26 ENCOUNTER — Encounter (HOSPITAL_BASED_OUTPATIENT_CLINIC_OR_DEPARTMENT_OTHER): Payer: Self-pay | Admitting: Physical Therapy

## 2022-02-26 ENCOUNTER — Other Ambulatory Visit: Payer: Self-pay

## 2022-02-26 ENCOUNTER — Ambulatory Visit (HOSPITAL_BASED_OUTPATIENT_CLINIC_OR_DEPARTMENT_OTHER): Payer: Medicare Other | Admitting: Physical Therapy

## 2022-02-26 DIAGNOSIS — M25552 Pain in left hip: Secondary | ICD-10-CM | POA: Diagnosis not present

## 2022-02-26 DIAGNOSIS — M6281 Muscle weakness (generalized): Secondary | ICD-10-CM | POA: Diagnosis not present

## 2022-02-26 DIAGNOSIS — R2681 Unsteadiness on feet: Secondary | ICD-10-CM

## 2022-02-26 DIAGNOSIS — R262 Difficulty in walking, not elsewhere classified: Secondary | ICD-10-CM

## 2022-02-26 NOTE — Therapy (Signed)
[place], [place], [address] Phone: [phone]   Fax:  [phone]  Physical Therapy Treatment Note  Patient Details  Name: Carrie Chung MRN: [id number] Date of Birth: [birth year] Referring Provider (PT): Edmonia Lynch, MD   Encounter Date: 02/26/2022   PT End of Session - 02/26/22 1106     Visit Number 41    Number of Visits 69    Date for PT Re-Evaluation 05/24/22    Authorization Type MCR    PT Start Time 1103    PT Stop Time 1143    PT Time Calculation (min) 40 min    Activity Tolerance Patient tolerated treatment well    Behavior During Therapy [facility] for tasks assessed/performed              Past Medical History:  Diagnosis Date   Atrial tachycardia ([place])    a. noted at cardiac rehab 5/13; event monitor ordered to assess for AFib   CAD (coronary artery disease)    a. NSTEMI 03/2012 ([place] 03/27/12: pLAD 30%, mLAD 99%, mCFX 95%, mRCA 99% with thrombus, EF 65%);  s/p PTCA/DESx1 to prox-mid RCA 03/27/12 urgently in setting of hypotension/bradycardia, with staged PTCA/Evolve study stent to mid LAD & PTCA/Evolve study stent to prox LCx 03/29/12 ;   echo 03/28/12:EF 60%, Aortic sclerosis without AS, mild RVE, mild reduced RVSF     Endometrial polyp    HTN (hypertension)    Hyperlipidemia    MI, old    Sleep related hypoxia     Past Surgical History:  Procedure Laterality Date   APPENDECTOMY  81 years old   CAROTID STENT  04/2012   x3   CESAREAN SECTION  1984   CHOLECYSTECTOMY  1978   INTRAMEDULLARY (IM) NAIL INTERTROCHANTERIC Left 06/20/2021   Procedure: INTRAMEDULLARY (IM) NAIL INTERTROCHANTRIC;  Surgeon: Renette Butters, MD;  Location: [facility];  Service: Orthopedics;  Laterality: Left;   LEFT HEART CATHETERIZATION WITH CORONARY ANGIOGRAM N/A 03/27/2012   Procedure: LEFT HEART CATHETERIZATION WITH CORONARY ANGIOGRAM;  Surgeon: Burnell Blanks, MD;  Location: [facility] CATH LAB;  Service:  Cardiovascular;  Laterality: N/A;   PERCUTANEOUS CORONARY STENT INTERVENTION (PCI-S) N/A 03/27/2012   Procedure: PERCUTANEOUS CORONARY STENT INTERVENTION (PCI-S);  Surgeon: Burnell Blanks, MD;  Location: [facility] CATH LAB;  Service: Cardiovascular;  Laterality: N/A;   PERCUTANEOUS CORONARY STENT INTERVENTION (PCI-S) N/A 03/29/2012   Procedure: PERCUTANEOUS CORONARY STENT INTERVENTION (PCI-S);  Surgeon: [name], MD;  Location: [facility] CATH LAB;  Service: Cardiovascular;  Laterality: N/A;   TONSILLECTOMY  81 years old    There were no vitals filed for this visit.   Subjective Assessment - 02/26/22 1107     Subjective Pt states she is a little sore today from taking care of her grandchildren    Currently in Pain? Yes    Pain Score 2     Pain Location Hip                 OBJECTIVE: Manual therapy: Mulligan mobilizations L hip- inf and lateral grade III  Pt performed:  -Recumbent bike 6 min- L1 seated 2; half turns with transition into full rotations    Butterfly stretch 10s 5x -Supine bridge 2x10 -Seated flexion stretch reach between legs 5s 15x L foot on 8" box -STS 2" box under L LE 2x10 -step up 4" box 2x10 fwd without UE support        PT Education - 02/26/22 1210  Education Details anatomy, exercise progression, muscle firing, HEP    Person(s) Educated Patient    Methods Explanation;Demonstration    Comprehension Verbalized understanding;Returned demonstration               PT Short Term Goals - 02/23/22 1312       PT SHORT TERM GOAL #1   Title pt will demo hip flexion, abd and extension strength at least 4/5    Baseline see flowsheet    Time 4    Period Weeks    Status Achieved      PT SHORT TERM GOAL #2   Title pt will be safe in household ambulation without SPC.    Baseline --    Time 4    Period Weeks    Status New      PT SHORT TERM GOAL #3   Title pt will be able to navigate stairs with use of hand rail and no AD to go upstairs in  her home    Time 4    Period Weeks    Status New               PT Long Term Goals - 02/23/22 1312       PT LONG TERM GOAL #1   Title Pt will be able to play with her grandchildren without limitations    Time 8    Period Weeks    Status Partially Met      PT LONG TERM GOAL #2   Title pt will be able to ambulate for at least 1 hour without AD for functional ambulation    Baseline still using AD consistently,walking about 500 feet from car to pool and I need to sit because of pain and fatigue    Time 8    Period Weeks    Status Partially Met      PT LONG TERM GOAL #3   Title pt will be able to stand and cook large meal without rest break    Baseline able to stand 45 mins    Time 8    Period Weeks    Status Achieved      PT LONG TERM GOAL #4   Title pt will d/c to proper long term program including nustep for cardiac challenges    Time 8    Period Weeks    Status On-going      PT LONG TERM GOAL #5   Title Pt will be able to demonstrate/report ability to walk >20 mins without pain in order to demonstrate functional improvement and tolerance to exercise and community mobility.    Time 8    Period Weeks    Status New                   Plan - 02/26/22 1111     Clinical Impression Statement Pt presented to session with lateral hip pain/stiffness that was relieved by end of session. Pt responded well to progressed ROM as well as manual therapy for L hip. Pt is able to reach deeper into hip flexion and now able to reach L foot in order to simulate self care/dressing tasks at home. However, she is still limited in end range reaching for putting on socks and shoes. Pt also able to progress step up height with less UE support at this session. Plan to revisit and work on L LE stability progression at future sesions. Pt would benefit from continued skilled therapy in order to reach  goals and maximize functional L LE strength and ROM for prevention of future falls.     Examination-Activity Limitations Bathing;Locomotion Level;Bed Mobility;Bend;Sit;Caring for Others;Carry;Sleep;Squat;Stairs;Stand    Examination-Participation Restrictions Meal Prep;Cleaning;Community Activity    Rehab Potential Good    PT Frequency 2x / week    PT Duration 8 weeks    PT Treatment/Interventions ADLs/Self Care Home Management;Aquatic Therapy;Cryotherapy;Electrical Stimulation;Gait training;Ultrasound;Traction;Moist Heat;Stair training;Functional mobility training;Therapeutic activities;Therapeutic exercise;Balance training;Neuromuscular re-education;Manual techniques;Patient/family education;Passive range of motion;Dry needling;Taping    PT Next Visit Plan Cont with ther ex, proprioceptive activities, and flexibility.    PT Home Exercise Plan [id number]    Consulted and Agree with Plan of Care Patient              Patient will benefit from skilled therapeutic intervention in order to improve the following deficits and impairments:  Abnormal gait, Decreased range of motion, Difficulty walking, Decreased activity tolerance, Pain, Improper body mechanics, Impaired flexibility, Decreased balance, Decreased scar mobility, Decreased strength, Postural dysfunction  Visit Diagnosis: Difficulty in walking, not elsewhere classified  Muscle weakness (generalized)  Pain in left hip  Unsteadiness on feet     Problem List Patient Active Problem List   Diagnosis Date Noted   Pain due to onychomycosis of toenails of both feet 08/22/2021   Intertrochanteric fracture of left hip ([place]) 06/24/2021   Closed left hip fracture, initial encounter ([place]) 06/20/2021   Hypokalemia 06/20/2021   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   Cognitive complaints 06/02/2016   Hypoxemic respiratory failure, chronic ([place]) 09/19/2015   MCI (mild cognitive impairment) 03/06/2015   Depression with anxiety 03/06/2015   Bullae 07/02/2014   History of MRSA infection 11/20/2012   Low back pain 07/07/2012   Insomnia  04/28/2012   Coronary Artery Disease 04/19/2012   Hyperlipidemia 04/19/2012   Anxiety 04/19/2012   Non-ST elevation myocardial infarction (NSTEMI), initial care episode ([place]) 03/28/2012   HYPERTENSION, BENIGN 06/12/2010   Dyspnea 06/12/2010   Prolonged QT interval 06/12/2010    Daleen Bo, PT 02/26/2022, 12:14 PM  [facility] [address] [place], [place], [address] Phone: [phone]   Fax:  [phone]  Name: ZERIAH BAYSINGER MRN: [id number] Date of Birth: 19-Jun-1941

## 2022-02-26 NOTE — Addendum Note (Signed)
Addended by: Daleen Bo on: 02/26/2022 11:15 AM ? ? Modules accepted: Orders ? ?

## 2022-03-02 ENCOUNTER — Other Ambulatory Visit: Payer: Self-pay

## 2022-03-02 ENCOUNTER — Encounter (HOSPITAL_BASED_OUTPATIENT_CLINIC_OR_DEPARTMENT_OTHER): Payer: Self-pay | Admitting: Physical Therapy

## 2022-03-02 ENCOUNTER — Ambulatory Visit (HOSPITAL_BASED_OUTPATIENT_CLINIC_OR_DEPARTMENT_OTHER): Payer: Medicare Other | Admitting: Physical Therapy

## 2022-03-02 DIAGNOSIS — M6281 Muscle weakness (generalized): Secondary | ICD-10-CM

## 2022-03-02 DIAGNOSIS — R262 Difficulty in walking, not elsewhere classified: Secondary | ICD-10-CM

## 2022-03-02 DIAGNOSIS — M25552 Pain in left hip: Secondary | ICD-10-CM | POA: Diagnosis not present

## 2022-03-02 DIAGNOSIS — R2681 Unsteadiness on feet: Secondary | ICD-10-CM | POA: Diagnosis not present

## 2022-03-02 NOTE — Therapy (Signed)
[facility] [address] [place], [place], [address] Phone: [phone]   Fax:  [phone]  Physical Therapy Treatment  Patient Details  Name: Carrie Chung MRN: [id number] Date of Birth: [birth year] Referring Provider (PT): Edmonia Lynch, MD   Encounter Date: 03/02/2022   PT End of Session - 03/02/22 1117     Visit Number 42    Number of Visits 41    Date for PT Re-Evaluation 05/24/22    Authorization Type MCR    PT Start Time 1058    PT Stop Time 1140    PT Time Calculation (min) 42 min    Activity Tolerance Patient tolerated treatment well    Behavior During Therapy [facility] for tasks assessed/performed             Past Medical History:  Diagnosis Date   Atrial tachycardia ([place])    a. noted at cardiac rehab 5/13; event monitor ordered to assess for AFib   CAD (coronary artery disease)    a. NSTEMI 03/2012 ([place] 03/27/12: pLAD 30%, mLAD 99%, mCFX 95%, mRCA 99% with thrombus, EF 65%);  s/p PTCA/DESx1 to prox-mid RCA 03/27/12 urgently in setting of hypotension/bradycardia, with staged PTCA/Evolve study stent to mid LAD & PTCA/Evolve study stent to prox LCx 03/29/12 ;   echo 03/28/12:EF 60%, Aortic sclerosis without AS, mild RVE, mild reduced RVSF     Endometrial polyp    HTN (hypertension)    Hyperlipidemia    MI, old    Sleep related hypoxia     Past Surgical History:  Procedure Laterality Date   APPENDECTOMY  81 years old   CAROTID STENT  04/2012   x3   CESAREAN SECTION  1984   CHOLECYSTECTOMY  1978   INTRAMEDULLARY (IM) NAIL INTERTROCHANTERIC Left 06/20/2021   Procedure: INTRAMEDULLARY (IM) NAIL INTERTROCHANTRIC;  Surgeon: Renette Butters, MD;  Location: [facility];  Service: Orthopedics;  Laterality: Left;   LEFT HEART CATHETERIZATION WITH CORONARY ANGIOGRAM N/A 03/27/2012   Procedure: LEFT HEART CATHETERIZATION WITH CORONARY ANGIOGRAM;  Surgeon: [place] Blanks, MD;  Location: [facility] CATH LAB;  Service: Cardiovascular;   Laterality: N/A;   PERCUTANEOUS CORONARY STENT INTERVENTION (PCI-S) N/A 03/27/2012   Procedure: PERCUTANEOUS CORONARY STENT INTERVENTION (PCI-S);  Surgeon: [place] Blanks, MD;  Location: [facility] CATH LAB;  Service: Cardiovascular;  Laterality: N/A;   PERCUTANEOUS CORONARY STENT INTERVENTION (PCI-S) N/A 03/29/2012   Procedure: PERCUTANEOUS CORONARY STENT INTERVENTION (PCI-S);  Surgeon: [name], MD;  Location: [facility] CATH LAB;  Service: Cardiovascular;  Laterality: N/A;   TONSILLECTOMY  81 years old    There were no vitals filed for this visit.   Subjective Assessment - 03/02/22 1102     Subjective Pt states she felt she was moving better after last session. She states she was taking care of the grandchildren and doing lots of standing and was tiring.    Currently in Pain? Yes    Pain Score 2     Pain Location Hip    Pain Descriptors / Indicators Aching;Sore                          OBJECTIVE: Manual therapy: Mulligan mobilizations L hip- inf and lateral grade III   Pt performed:   -Recumbent bike 6 min- L1 seated 2; half turns with transition into full rotations     -standing fwd and lateral L LE lunge stretch 5s 10x (fwd L leading, lateral R leading)  -standing  hip hinge with UE support 2x10 -STS 2" box under L LE 2x10 -step up 4" box 2x10 fwd and lateral without UE support     PT Short Term Goals - 02/23/22 1312       PT SHORT TERM GOAL #1   Title pt will demo hip flexion, abd and extension strength at least 4/5    Baseline see flowsheet    Time 4    Period Weeks    Status Achieved      PT SHORT TERM GOAL #2   Title pt will be safe in household ambulation without SPC.    Baseline --    Time 4    Period Weeks    Status New      PT SHORT TERM GOAL #3   Title pt will be able to navigate stairs with use of hand rail and no AD to go upstairs in her home    Time 4    Period Weeks    Status New               PT Long Term Goals - 02/23/22  1312       PT LONG TERM GOAL #1   Title Pt will be able to play with her grandchildren without limitations    Time 8    Period Weeks    Status Partially Met      PT LONG TERM GOAL #2   Title pt will be able to ambulate for at least 1 hour without AD for functional ambulation    Baseline still using AD consistently,walking about 500 feet from car to pool and I need to sit because of pain and fatigue    Time 8    Period Weeks    Status Partially Met      PT LONG TERM GOAL #3   Title pt will be able to stand and cook large meal without rest break    Baseline able to stand 45 mins    Time 8    Period Weeks    Status Achieved      PT LONG TERM GOAL #4   Title pt will d/c to proper long term program including nustep for cardiac challenges    Time 8    Period Weeks    Status On-going      PT LONG TERM GOAL #5   Title Pt will be able to demonstrate/report ability to walk >20 mins without pain in order to demonstrate functional improvement and tolerance to exercise and community mobility.    Time 8    Period Weeks    Status New                   Plan - 03/02/22 1106     Clinical Impression Statement Pt presented to session with lateral hip pain/soreness that was reduced to 0/10 at end of session. Pt able to progress stretching ot standing CKC as well improving ease with SL stability exercise in multiple planes. Pt does demonstrate hip weakness/ endurance deficits with DL hip hinging. Plan to progress step up height. Pt would benefit from continued skilled therapy in order to reach goals and maximize functional L LE strength and ROM for prevention of future falls.    Examination-Activity Limitations Bathing;Locomotion Level;Bed Mobility;Bend;Sit;Caring for Others;Carry;Sleep;Squat;Stairs;Stand    Examination-Participation Restrictions Meal Prep;Cleaning;Community Activity    Rehab Potential Good    PT Frequency 2x / week    PT Duration 8 weeks  PT Treatment/Interventions  ADLs/Self Care Home Management;Aquatic Therapy;Cryotherapy;Electrical Stimulation;Gait training;Ultrasound;Traction;Moist Heat;Stair training;Functional mobility training;Therapeutic activities;Therapeutic exercise;Balance training;Neuromuscular re-education;Manual techniques;Patient/family education;Passive range of motion;Dry needling;Taping    PT Next Visit Plan Cont with ther ex, proprioceptive activities, and flexibility.    PT Home Exercise Plan [id number]    Consulted and Agree with Plan of Care Patient             Patient will benefit from skilled therapeutic intervention in order to improve the following deficits and impairments:  Abnormal gait, Decreased range of motion, Difficulty walking, Decreased activity tolerance, Pain, Improper body mechanics, Impaired flexibility, Decreased balance, Decreased scar mobility, Decreased strength, Postural dysfunction  Visit Diagnosis: Difficulty in walking, not elsewhere classified  Muscle weakness (generalized)  Pain in left hip  Unsteadiness on feet     Problem List Patient Active Problem List   Diagnosis Date Noted   Pain due to onychomycosis of toenails of both feet 08/22/2021   Intertrochanteric fracture of left hip ([place]) 06/24/2021   Closed left hip fracture, initial encounter ([place]) 06/20/2021   Hypokalemia 06/20/2021   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   Cognitive complaints 06/02/2016   Hypoxemic respiratory failure, chronic ([place]) 09/19/2015   MCI (mild cognitive impairment) 03/06/2015   Depression with anxiety 03/06/2015   Bullae 07/02/2014   History of MRSA infection 11/20/2012   Low back pain 07/07/2012   Insomnia 04/28/2012   Coronary Artery Disease 04/19/2012   Hyperlipidemia 04/19/2012   Anxiety 04/19/2012   Non-ST elevation myocardial infarction (NSTEMI), initial care episode ([place]) 03/28/2012   HYPERTENSION, BENIGN 06/12/2010   Dyspnea 06/12/2010   Prolonged QT interval 06/12/2010    Daleen Bo, PT 03/02/2022,  11:46 AM  [facility] [place], [place], [address] Phone: [phone]   Fax:  [phone]  Name: Carrie Chung MRN: [id number] Date of Birth: Sep 30, 1941

## 2022-03-05 ENCOUNTER — Ambulatory Visit (HOSPITAL_BASED_OUTPATIENT_CLINIC_OR_DEPARTMENT_OTHER): Payer: Medicare Other | Admitting: Physical Therapy

## 2022-03-05 ENCOUNTER — Encounter (HOSPITAL_BASED_OUTPATIENT_CLINIC_OR_DEPARTMENT_OTHER): Payer: Self-pay | Admitting: Physical Therapy

## 2022-03-05 ENCOUNTER — Other Ambulatory Visit: Payer: Self-pay

## 2022-03-05 DIAGNOSIS — M25552 Pain in left hip: Secondary | ICD-10-CM

## 2022-03-05 DIAGNOSIS — R2681 Unsteadiness on feet: Secondary | ICD-10-CM

## 2022-03-05 DIAGNOSIS — R262 Difficulty in walking, not elsewhere classified: Secondary | ICD-10-CM | POA: Diagnosis not present

## 2022-03-05 DIAGNOSIS — M6281 Muscle weakness (generalized): Secondary | ICD-10-CM

## 2022-03-05 NOTE — Therapy (Signed)
[facility] ?[place] ?[place], [place], [address] ?Phone: [phone]   Fax:  [phone] ? ?Physical Therapy Treatment ? ?Patient Details  ?Name: Carrie Chung ?MRN: [id number] ?Date of Birth: 11-26-1941 ?Referring Provider (PT): Edmonia Lynch, MD ? ? ?Encounter Date: 03/05/2022 ? ? PT End of Session - 03/05/22 1108   ? ? Visit Number 79   ? Number of Visits 56   ? Date for PT Re-Evaluation 05/24/22   ? Authorization Type MCR   ? PT Start Time 1100   ? PT Stop Time 1140   ? PT Time Calculation (min) 40 min   ? Activity Tolerance Patient tolerated treatment well   ? Behavior During Therapy [facility] for tasks assessed/performed   ? ?  ?  ? ?  ? ? ?Past Medical History:  ?Diagnosis Date  ? Atrial tachycardia ([place])   ? a. noted at cardiac rehab 5/13; event monitor ordered to assess for AFib  ? CAD (coronary artery disease)   ? a. NSTEMI 03/2012 ([place] 03/27/12: pLAD 30%, mLAD 99%, mCFX 95%, mRCA 99% with thrombus, EF 65%);  s/p PTCA/DESx1 to prox-mid RCA 03/27/12 urgently in setting of hypotension/bradycardia, with staged PTCA/Evolve study stent to mid LAD & PTCA/Evolve study stent to prox LCx 03/29/12 ;   echo 03/28/12:EF 60%, Aortic sclerosis without AS, mild RVE, mild reduced RVSF    ? Endometrial polyp   ? HTN (hypertension)   ? Hyperlipidemia   ? MI, old   ? Sleep related hypoxia   ? ? ?Past Surgical History:  ?Procedure Laterality Date  ? APPENDECTOMY  81 years old  ? CAROTID STENT  04/2012  ? x3  ? [place]  ? CHOLECYSTECTOMY  1978  ? INTRAMEDULLARY (IM) NAIL INTERTROCHANTERIC Left 06/20/2021  ? Procedure: INTRAMEDULLARY (IM) NAIL INTERTROCHANTRIC;  Surgeon: Renette Butters, MD;  Location: [facility];  Service: Orthopedics;  Laterality: Left;  ? LEFT HEART CATHETERIZATION WITH CORONARY ANGIOGRAM N/A 03/27/2012  ? Procedure: LEFT HEART CATHETERIZATION WITH CORONARY ANGIOGRAM;  Surgeon: [facility] Blanks, MD;  Location: [facility] CATH LAB;  Service: Cardiovascular;   Laterality: N/A;  ? PERCUTANEOUS CORONARY STENT INTERVENTION (PCI-S) N/A 03/27/2012  ? Procedure: PERCUTANEOUS CORONARY STENT INTERVENTION (PCI-S);  Surgeon: Burnell Blanks, MD;  Location: [facility] CATH LAB;  Service: Cardiovascular;  Laterality: N/A;  ? PERCUTANEOUS CORONARY STENT INTERVENTION (PCI-S) N/A 03/29/2012  ? Procedure: PERCUTANEOUS CORONARY STENT INTERVENTION (PCI-S);  Surgeon: [name], MD;  Location: [facility] CATH LAB;  Service: Cardiovascular;  Laterality: N/A;  ? TONSILLECTOMY  81 years old  ? ? ?There were no vitals filed for this visit. ? ? Subjective Assessment - 03/05/22 1104   ? ? Subjective Pt states that the hip is more sore today. She states that sleeping has been more difficult to get comfortable while sleeping at night.   ? Currently in Pain? Yes   ? Pain Score 2    ? Pain Location Hip   ? Pain Descriptors / Indicators Aching;Sore   ? ?  ?  ? ?  ? ? ? ? ? ? ? ? ? ? ? ? ? ? ? ?OBJECTIVE: ?Manual therapy: STM L quad- rec fem and VL  ?  ?Pt performed: ?  ?-Recumbent bike 6 min- L1 seated 2; half turns with transition into full rotations   ?  ?-foam L LE balance with R toe touch 20s 4x ?-staggered stance calf raise 20x- no UE ?-standing fwd and lateral L LE  lunge stretch 5s 10x (fwd L leading, lateral R leading)  ?-standing hip hinge with UE support 2x10 ? ?-step up 4" box 2x10 fwd and lateral without UE support (hold) ? ? ? ? PT Short Term Goals - 02/23/22 1312   ? ?  ? PT SHORT TERM GOAL #1  ? Title pt will demo hip flexion, abd and extension strength at least 4/5   ? Baseline see flowsheet   ? Time 4   ? Period Weeks   ? Status Achieved   ?  ? PT SHORT TERM GOAL #2  ? Title pt will be safe in household ambulation without SPC.   ? Baseline --   ? Time 4   ? Period Weeks   ? Status New   ?  ? PT SHORT TERM GOAL #3  ? Title pt will be able to navigate stairs with use of hand rail and no AD to go upstairs in her home   ? Time 4   ? Period Weeks   ? Status New   ? ?  ?  ? ?  ? ? ? ? PT Long Term  Goals - 02/23/22 1312   ? ?  ? PT LONG TERM GOAL #1  ? Title Pt will be able to play with her grandchildren without limitations   ? Time 8   ? Period Weeks   ? Status Partially Met   ?  ? PT LONG TERM GOAL #2  ? Title pt will be able to ambulate for at least 1 hour without AD for functional ambulation   ? Baseline still using AD consistently,walking about 500 feet from car to pool and I need to sit because of pain and fatigue   ? Time 8   ? Period Weeks   ? Status Partially Met   ?  ? PT LONG TERM GOAL #3  ? Title pt will be able to stand and cook large meal without rest break   ? Baseline able to stand 45 mins   ? Time 8   ? Period Weeks   ? Status Achieved   ?  ? PT LONG TERM GOAL #4  ? Title pt will d/c to proper long term program including nustep for cardiac challenges   ? Time 8   ? Period Weeks   ? Status On-going   ?  ? PT LONG TERM GOAL #5  ? Title Pt will be able to demonstrate/report ability to walk >20 mins without pain in order to demonstrate functional improvement and tolerance to exercise and community mobility.   ? Time 8   ? Period Weeks   ? Status New   ? ?  ?  ? ?  ? ? ? ? ? ? ? ? Plan - 03/05/22 1107   ? ? Clinical Impression Statement Pt presented to session with increased quadricep pain that was reduced with STM and quad stretching. By end of session, pt able to perform knee extension and terminal stance of gait without pain and required less assistance with [facility] for ambulation. Pt was able to continue with progression of SL stability exercise without recreating pain. Plan to progress step up height at next session if pt continues to have decreased discomfort. Pt would benefit from continued skilled therapy in order to reach goals and maximize functional L LE strength and ROM for prevention of future falls.   ? Examination-Activity Limitations Bathing;Locomotion Level;Bed Mobility;Bend;Sit;Caring for Others;Carry;Sleep;Squat;Stairs;Stand   ? Examination-Participation Restrictions Meal  Prep;Cleaning;Community Activity   ? Rehab Potential Good   ? PT Frequency 2x / week   ? PT Duration 8 weeks   ? PT Treatment/Interventions ADLs/Self Care Home Management;Aquatic Therapy;Cryotherapy;Electrical Stimulation;Gait training;Ultrasound;Traction;Moist Heat;Stair training;Functional mobility training;Therapeutic activities;Therapeutic exercise;Balance training;Neuromuscular re-education;Manual techniques;Patient/family education;Passive range of motion;Dry needling;Taping   ? PT Next Visit Plan Cont with ther ex, proprioceptive activities, and flexibility.   ? PT [place]   ? Consulted and Agree with Plan of Care Patient   ? ?  ?  ? ?  ? ? ?Patient will benefit from skilled therapeutic intervention in order to improve the following deficits and impairments:  Abnormal gait, Decreased range of motion, Difficulty walking, Decreased activity tolerance, Pain, Improper body mechanics, Impaired flexibility, Decreased balance, Decreased scar mobility, Decreased strength, Postural dysfunction ? ?Visit Diagnosis: ?Difficulty in walking, not elsewhere classified ? ?Muscle weakness (generalized) ? ?Pain in left hip ? ?Unsteadiness on feet ? ? ? ? ?Problem List ?Patient Active Problem List  ? Diagnosis Date Noted  ? Pain due to onychomycosis of toenails of both feet 08/22/2021  ? Intertrochanteric fracture of left hip ([place]) 06/24/2021  ? Closed left hip fracture, initial encounter ([place]) 06/20/2021  ? Hypokalemia 06/20/2021  ? 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  ? Cognitive complaints 06/02/2016  ? Hypoxemic respiratory failure, chronic ([place]) 09/19/2015  ? MCI (mild cognitive impairment) 03/06/2015  ? Depression with anxiety 03/06/2015  ? [place] 07/02/2014  ? History of MRSA infection 11/20/2012  ? Low back pain 07/07/2012  ? Insomnia 04/28/2012  ? Coronary Artery Disease 04/19/2012  ? Hyperlipidemia 04/19/2012  ? Anxiety 04/19/2012  ? Non-ST elevation myocardial infarction (NSTEMI), initial care episode ([place])  03/28/2012  ? HYPERTENSION, BENIGN 06/12/2010  ? Dyspnea 06/12/2010  ? Prolonged QT interval 06/12/2010  ? ? ?Daleen Bo, PT ?03/05/2022, 11:46 AM ? ?[facility] ?[place] ?[place]

## 2022-03-09 ENCOUNTER — Ambulatory Visit (HOSPITAL_BASED_OUTPATIENT_CLINIC_OR_DEPARTMENT_OTHER): Payer: Medicare Other | Admitting: Physical Therapy

## 2022-03-09 ENCOUNTER — Other Ambulatory Visit: Payer: Self-pay

## 2022-03-09 ENCOUNTER — Encounter (HOSPITAL_BASED_OUTPATIENT_CLINIC_OR_DEPARTMENT_OTHER): Payer: Self-pay | Admitting: Physical Therapy

## 2022-03-09 DIAGNOSIS — R262 Difficulty in walking, not elsewhere classified: Secondary | ICD-10-CM

## 2022-03-09 DIAGNOSIS — M6281 Muscle weakness (generalized): Secondary | ICD-10-CM | POA: Diagnosis not present

## 2022-03-09 DIAGNOSIS — M25552 Pain in left hip: Secondary | ICD-10-CM | POA: Diagnosis not present

## 2022-03-09 DIAGNOSIS — R2681 Unsteadiness on feet: Secondary | ICD-10-CM | POA: Diagnosis not present

## 2022-03-09 NOTE — Therapy (Signed)
[place] [facility] [address] ?[place], [place], [address] ?Phone: [phone]   Fax:  [phone] ? ?Physical Therapy Treatment ? ?Patient Details  ?Name: Carrie Chung ?MRN: [id number] ?Date of Birth: January 15, 1941 ?Referring Provider (PT): Edmonia Lynch, MD ? ? ?Encounter Date: 03/09/2022 ? ? PT End of Session - 03/09/22 1220   ? ? Visit Number [id number]   ? Number of Visits 56   ? Date for PT Re-Evaluation 05/24/22   ? Authorization Type MCR   ? PT Start Time 1100   ? PT Stop Time 1140   ? PT Time Calculation (min) 40 min   ? Activity Tolerance Patient tolerated treatment well   ? Behavior During Therapy [facility] for tasks assessed/performed   ? ?  ?  ? ?  ? ? ?Past Medical History:  ?Diagnosis Date  ? Atrial tachycardia ([place])   ? a. noted at cardiac rehab 5/13; event monitor ordered to assess for AFib  ? CAD (coronary artery disease)   ? a. NSTEMI 03/2012 ([place] 03/27/12: pLAD 30%, mLAD 99%, mCFX 95%, mRCA 99% with thrombus, EF 65%);  s/p PTCA/DESx1 to prox-mid RCA 03/27/12 urgently in setting of hypotension/bradycardia, with staged PTCA/Evolve study stent to mid LAD & PTCA/Evolve study stent to prox LCx 03/29/12 ;   echo 03/28/12:EF 60%, Aortic sclerosis without AS, mild RVE, mild reduced RVSF    ? Endometrial polyp   ? HTN (hypertension)   ? Hyperlipidemia   ? MI, old   ? Sleep related hypoxia   ? ? ?Past Surgical History:  ?Procedure Laterality Date  ? APPENDECTOMY  81 years old  ? CAROTID STENT  04/2012  ? x3  ? [place]  ? CHOLECYSTECTOMY  1978  ? INTRAMEDULLARY (IM) NAIL INTERTROCHANTERIC Left 06/20/2021  ? Procedure: INTRAMEDULLARY (IM) NAIL INTERTROCHANTRIC;  Surgeon: Renette Butters, MD;  Location: [facility];  Service: Orthopedics;  Laterality: Left;  ? LEFT HEART CATHETERIZATION WITH CORONARY ANGIOGRAM N/A 03/27/2012  ? Procedure: LEFT HEART CATHETERIZATION WITH CORONARY ANGIOGRAM;  Surgeon: Burnell Blanks, MD;  Location: [facility] CATH LAB;  Service: Cardiovascular;   Laterality: N/A;  ? PERCUTANEOUS CORONARY STENT INTERVENTION (PCI-S) N/A 03/27/2012  ? Procedure: PERCUTANEOUS CORONARY STENT INTERVENTION (PCI-S);  Surgeon: Burnell Blanks, MD;  Location: [facility] CATH LAB;  Service: Cardiovascular;  Laterality: N/A;  ? PERCUTANEOUS CORONARY STENT INTERVENTION (PCI-S) N/A 03/29/2012  ? Procedure: PERCUTANEOUS CORONARY STENT INTERVENTION (PCI-S);  Surgeon: [name], MD;  Location: [facility] CATH LAB;  Service: Cardiovascular;  Laterality: N/A;  ? TONSILLECTOMY  81 years old  ? ? ?There were no vitals filed for this visit. ? ? Subjective Assessment - 03/09/22 1105   ? ? Subjective Pt states that after last session she had some quad tightness after last session. She still had some trouble sleeping and had pain with L sidelying. She has had to go up and down stairs more bc husband is not doing well.   ? Currently in Pain? Yes   ? Pain Score 2    ? Pain Location Hip   ? Pain Descriptors / Indicators Aching;Sore   ? ?  ?  ? ?  ? ? ? ? ? ? ? ?  ?  ?OBJECTIVE: ?Manual therapy: STM L quad- rec fem and VL  ?Joint mob: lateral and inf Mulligan hip mob grade III  ?  ?Pt performed: ?  ? ?-standing ITB stretch with Ue support  10s 10x ?SL bridge on L 10x ? -  step up 6" box 2x10 fwd and lateral without UE support ?  ? ?Kneel to stand transfers with UE support - demonstrated and edu given about technique and sequencing ?  ? ? ? ? ? ? ? PT Short Term Goals - 03/09/22 1124   ? ?  ? PT SHORT TERM GOAL #1  ? Title pt will demo hip flexion, abd and extension strength at least 4/5   ? Baseline see flowsheet   ? Time 4   ? Period Weeks   ? Status Achieved   ?  ? PT SHORT TERM GOAL #2  ? Title pt will be safe in household ambulation without SPC.   ? Time 4   ? Period Weeks   ? Status Achieved   ?  ? PT SHORT TERM GOAL #3  ? Title pt will be able to navigate stairs with use of hand rail and no AD to go upstairs in her home   ? Time 4   ? Period Weeks   ? Status New   ? ?  ?  ? ?  ? ? ? ? PT Long Term Goals -  02/23/22 1312   ? ?  ? PT LONG TERM GOAL #1  ? Title Pt will be able to play with her grandchildren without limitations   ? Time 8   ? Period Weeks   ? Status Partially Met   ?  ? PT LONG TERM GOAL #2  ? Title pt will be able to ambulate for at least 1 hour without AD for functional ambulation   ? Baseline still using AD consistently,walking about 500 feet from car to pool and I need to sit because of pain and fatigue   ? Time 8   ? Period Weeks   ? Status Partially Met   ?  ? PT LONG TERM GOAL #3  ? Title pt will be able to stand and cook large meal without rest break   ? Baseline able to stand 45 mins   ? Time 8   ? Period Weeks   ? Status Achieved   ?  ? PT LONG TERM GOAL #4  ? Title pt will d/c to proper long term program including nustep for cardiac challenges   ? Time 8   ? Period Weeks   ? Status On-going   ?  ? PT LONG TERM GOAL #5  ? Title Pt will be able to demonstrate/report ability to walk >20 mins without pain in order to demonstrate functional improvement and tolerance to exercise and community mobility.   ? Time 8   ? Period Weeks   ? Status New   ? ?  ?  ? ?  ? ? ? ? ? ? ? ? Plan - 03/09/22 1107   ? ? Clinical Impression Statement Pt with good response to joint mobilization at today's session with improvement in gait at today's session. Pt able to increase step up height as well as SL stability exercise. Of note, pt showed signficant apprenhesion with floor to stand transfer so edu was given verbally and through demo. Plan to teach floor to stand transfers as able to improve pt mobility as well as improving self righting techniques. Plan to progress ROM as tolerated. Pt would benefit from continued skilled therapy in order to reach goals and maximize functional L LE strength and ROM for prevention of future falls.   ? Examination-Activity Limitations Bathing;Locomotion Level;Bed Mobility;Bend;Sit;Caring for Others;Carry;Sleep;Squat;Stairs;Stand   ? Examination-Participation  Restrictions Meal  Prep;Cleaning;Community Activity   ? Rehab Potential Good   ? PT Frequency 2x / week   ? PT Duration 8 weeks   ? PT Treatment/Interventions ADLs/Self Care Home Management;Aquatic Therapy;Cryotherapy;Electrical Stimulation;Gait training;Ultrasound;Traction;Moist Heat;Stair training;Functional mobility training;Therapeutic activities;Therapeutic exercise;Balance training;Neuromuscular re-education;Manual techniques;Patient/family education;Passive range of motion;Dry needling;Taping   ? PT Next Visit Plan Cont with ther ex, proprioceptive activities, and flexibility.   ? PT [place]   ? Consulted and Agree with Plan of Care Patient   ? ?  ?  ? ?  ? ? ?Patient will benefit from skilled therapeutic intervention in order to improve the following deficits and impairments:  Abnormal gait, Decreased range of motion, Difficulty walking, Decreased activity tolerance, Pain, Improper body mechanics, Impaired flexibility, Decreased balance, Decreased scar mobility, Decreased strength, Postural dysfunction ? ?Visit Diagnosis: ?Difficulty in walking, not elsewhere classified ? ?Muscle weakness (generalized) ? ?Pain in left hip ? ? ? ? ?Problem List ?Patient Active Problem List  ? Diagnosis Date Noted  ? Pain due to onychomycosis of toenails of both feet 08/22/2021  ? Intertrochanteric fracture of left hip ([place]) 06/24/2021  ? Closed left hip fracture, initial encounter ([place]) 06/20/2021  ? Hypokalemia 06/20/2021  ? 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  ? Cognitive complaints 06/02/2016  ? Hypoxemic respiratory failure, chronic ([place]) 09/19/2015  ? MCI (mild cognitive impairment) 03/06/2015  ? Depression with anxiety 03/06/2015  ? [place] 07/02/2014  ? History of MRSA infection 11/20/2012  ? Low back pain 07/07/2012  ? Insomnia 04/28/2012  ? Coronary Artery Disease 04/19/2012  ? Hyperlipidemia 04/19/2012  ? Anxiety 04/19/2012  ? Non-ST elevation myocardial infarction (NSTEMI), initial care episode ([place]) 03/28/2012  ?  HYPERTENSION, BENIGN 06/12/2010  ? Dyspnea 06/12/2010  ? Prolonged QT interval 06/12/2010  ? ? ?Daleen Bo, PT ?03/09/2022, 12:22 PM ? ?[place] ?[place] [address] ?[place] ?[place]

## 2022-03-11 ENCOUNTER — Encounter: Payer: Self-pay | Admitting: Podiatry

## 2022-03-11 ENCOUNTER — Ambulatory Visit (INDEPENDENT_AMBULATORY_CARE_PROVIDER_SITE_OTHER): Payer: Medicare Other | Admitting: Podiatry

## 2022-03-11 ENCOUNTER — Other Ambulatory Visit: Payer: Self-pay

## 2022-03-11 DIAGNOSIS — M79675 Pain in left toe(s): Secondary | ICD-10-CM

## 2022-03-11 DIAGNOSIS — M79674 Pain in right toe(s): Secondary | ICD-10-CM | POA: Diagnosis not present

## 2022-03-11 DIAGNOSIS — B351 Tinea unguium: Secondary | ICD-10-CM | POA: Diagnosis not present

## 2022-03-11 NOTE — Progress Notes (Signed)
This patient returns to the office for evaluation and treatment of long thick painful nails .  This patient is unable to trim his own nails since the patient cannot reach his feet.  She has history of hip surgery which prevents her from touching her feet.  Patient says the nails are painful walking and wearing his shoes.  He returns for preventive foot care services. ? ?General Appearance  Alert, conversant and in no acute stress. ? ?Vascular  Dorsalis pedis and posterior tibial  pulses are absent  bilaterally.  Capillary return is within normal limits  bilaterally. Temperature is within normal limits  bilaterally. ? ?Neurologic  Senn-Weinstein monofilament wire test within normal limits  bilaterally. Muscle power within normal limits bilaterally. ? ?Nails Thick disfigured discolored nails with subungual debris  from hallux to fifth toes bilaterally. No evidence of bacterial infection or drainage bilaterally. ? ?Orthopedic  No limitations of motion  feet .  No crepitus or effusions noted.  HAV  B/L.  [place] toes 2,3  B/L ? ?Skin  normotropic skin with no porokeratosis noted bilaterally.  No signs of infections or ulcers noted.    ? ?Onychomycosis  Pain in toes right foot  Pain in toes left foot ? ?Debridement  of nails  1-5  B/L with a nail nipper.  Nails were then filed using a dremel tool with no incidents.    RTC 4 months  ? ? ?Gardiner Barefoot DPM   ?

## 2022-03-12 ENCOUNTER — Ambulatory Visit (HOSPITAL_BASED_OUTPATIENT_CLINIC_OR_DEPARTMENT_OTHER): Payer: Medicare Other | Admitting: Physical Therapy

## 2022-03-12 DIAGNOSIS — M25552 Pain in left hip: Secondary | ICD-10-CM

## 2022-03-12 DIAGNOSIS — M6281 Muscle weakness (generalized): Secondary | ICD-10-CM | POA: Diagnosis not present

## 2022-03-12 DIAGNOSIS — R2681 Unsteadiness on feet: Secondary | ICD-10-CM

## 2022-03-12 DIAGNOSIS — R262 Difficulty in walking, not elsewhere classified: Secondary | ICD-10-CM | POA: Diagnosis not present

## 2022-03-12 NOTE — Therapy (Signed)
[place] [place] ?[place] ?[place], [place], [address] ?Phone: [phone]   Fax:  [phone] ? ?Physical Therapy Treatment ? ?Patient Details  ?Name: Carrie Chung ?MRN: [id number] ?Date of Birth: [birth year] ?Referring Provider (PT): Edmonia Lynch, MD ? ? ?Encounter Date: 03/12/2022 ? ? PT End of Session - 03/12/22 1147   ? ? Visit Number [id number]   ? Number of Visits 56   ? Date for PT Re-Evaluation 05/24/22   ? Authorization Type MCR   ? PT Start Time 1100   ? PT Stop Time 1140   ? PT Time Calculation (min) 40 min   ? Activity Tolerance Patient tolerated treatment well   ? Behavior During Therapy [facility] for tasks assessed/performed   ? ?  ?  ? ?  ? ? ? ?Past Medical History:  ?Diagnosis Date  ? Atrial tachycardia ([place])   ? a. noted at cardiac rehab 5/13; event monitor ordered to assess for AFib  ? CAD (coronary artery disease)   ? a. NSTEMI 03/2012 ([place] 03/27/12: pLAD 30%, mLAD 99%, mCFX 95%, mRCA 99% with thrombus, EF 65%);  s/p PTCA/DESx1 to prox-mid RCA 03/27/12 urgently in setting of hypotension/bradycardia, with staged PTCA/Evolve study stent to mid LAD & PTCA/Evolve study stent to prox LCx 03/29/12 ;   echo 03/28/12:EF 60%, Aortic sclerosis without AS, mild RVE, mild reduced RVSF    ? Endometrial polyp   ? HTN (hypertension)   ? Hyperlipidemia   ? MI, old   ? Sleep related hypoxia   ? ? ?Past Surgical History:  ?Procedure Laterality Date  ? APPENDECTOMY  81 years old  ? CAROTID STENT  04/2012  ? x3  ? [place]  ? CHOLECYSTECTOMY  1978  ? INTRAMEDULLARY (IM) NAIL INTERTROCHANTERIC Left 06/20/2021  ? Procedure: INTRAMEDULLARY (IM) NAIL INTERTROCHANTRIC;  Surgeon: Renette Butters, MD;  Location: [place];  Service: Orthopedics;  Laterality: Left;  ? LEFT HEART CATHETERIZATION WITH CORONARY ANGIOGRAM N/A 03/27/2012  ? Procedure: LEFT HEART CATHETERIZATION WITH CORONARY ANGIOGRAM;  Surgeon: Burnell Blanks, MD;  Location: [facility] CATH LAB;  Service:  Cardiovascular;  Laterality: N/A;  ? PERCUTANEOUS CORONARY STENT INTERVENTION (PCI-S) N/A 03/27/2012  ? Procedure: PERCUTANEOUS CORONARY STENT INTERVENTION (PCI-S);  Surgeon: Burnell Blanks, MD;  Location: [facility] CATH LAB;  Service: Cardiovascular;  Laterality: N/A;  ? PERCUTANEOUS CORONARY STENT INTERVENTION (PCI-S) N/A 03/29/2012  ? Procedure: PERCUTANEOUS CORONARY STENT INTERVENTION (PCI-S);  Surgeon: [name], MD;  Location: [facility] CATH LAB;  Service: Cardiovascular;  Laterality: N/A;  ? TONSILLECTOMY  81 years old  ? ? ?There were no vitals filed for this visit. ? ? Subjective Assessment - 03/12/22 1103   ? ? Subjective Pt states she feels better today. She was able to walk from parking lot to office without use of cane. Pt did have muscle pain last night from the hip to the anterior thigh.   ? How long can you stand comfortably? about 5 min- limited by pain and fatigued around lower back   ? Patient Stated Goals Pt wants to be able to stand for longer duration.   ? Currently in Pain? Yes   ? Pain Score 1    ? Pain Location Hip   ? Pain Descriptors / Indicators Sore;Aching   ? Pain Onset More than a month ago   ? Pain Frequency Intermittent   ? ?  ?  ? ?  ? ? ? ? ? ? ? ?  ?  ?  OBJECTIVE: ?Manual therapy: STM L quad- rec fem and VL  ?Joint mob: lateral and inf Mulligan hip mob grade III  ?  ?Pt performed: ?  ?Seated SL raise 10x ?ITB stretch 5s 10x ?Seated flexion stretch with L foot on 8" box ?Seated figure 4 stretch with L leg cross 30s 3x ? -step up 6" box 2x10 fwd and lateral without UE support ?  ? ?Kneel to stand transfers with UE support - demonstrated and edu given about technique and sequencing; trialed from mat table. Able to get into half kneeling but apprehension when trying to transition to standing ?  ? ? ? ? ? ? ? PT Short Term Goals - 03/09/22 1124   ? ?  ? PT SHORT TERM GOAL #1  ? Title pt will demo hip flexion, abd and extension strength at least 4/5   ? Baseline see flowsheet   ? Time 4   ?  Period Weeks   ? Status Achieved   ?  ? PT SHORT TERM GOAL #2  ? Title pt will be safe in household ambulation without SPC.   ? Time 4   ? Period Weeks   ? Status Achieved   ?  ? PT SHORT TERM GOAL #3  ? Title pt will be able to navigate stairs with use of hand rail and no AD to go upstairs in her home   ? Time 4   ? Period Weeks   ? Status New   ? ?  ?  ? ?  ? ? ? ? PT Long Term Goals - 02/23/22 1312   ? ?  ? PT LONG TERM GOAL #1  ? Title Pt will be able to play with her grandchildren without limitations   ? Time 8   ? Period Weeks   ? Status Partially Met   ?  ? PT LONG TERM GOAL #2  ? Title pt will be able to ambulate for at least 1 hour without AD for functional ambulation   ? Baseline still using AD consistently,walking about 500 feet from car to pool and I need to sit because of pain and fatigue   ? Time 8   ? Period Weeks   ? Status Partially Met   ?  ? PT LONG TERM GOAL #3  ? Title pt will be able to stand and cook large meal without rest break   ? Baseline able to stand 45 mins   ? Time 8   ? Period Weeks   ? Status Achieved   ?  ? PT LONG TERM GOAL #4  ? Title pt will d/c to proper long term program including nustep for cardiac challenges   ? Time 8   ? Period Weeks   ? Status On-going   ?  ? PT LONG TERM GOAL #5  ? Title Pt will be able to demonstrate/report ability to walk >20 mins without pain in order to demonstrate functional improvement and tolerance to exercise and community mobility.   ? Time 8   ? Period Weeks   ? Status New   ? ?  ?  ? ?  ? ? ? ? ? ? ? ? Plan - 03/12/22 1104   ? ? Clinical Impression Statement Pt was able to make progress today with kneeling to stand transfer on elevated surface but had significant apprehension at the point of standing. Plan to progress as able. Pt additionally able to reach figure 4 position for donning doffing  socks at today's session. Pt hip ROM is improving though soft tissue restriction is causing more superficial pain/discomfort. Pt also able to progress  step up to 6" box with decreased "lurching" and no UE support. Pt progressing with mobility with more aggressive focus. Pt would benefit from continued skilled therapy in order to reach goals and maximize functional L LE strength and ROM for prevention of future falls.   ? Examination-Activity Limitations Bathing;Locomotion Level;Bed Mobility;Bend;Sit;Caring for Others;Carry;Sleep;Squat;Stairs;Stand   ? Examination-Participation Restrictions Meal Prep;Cleaning;Community Activity   ? Rehab Potential Good   ? PT Frequency 2x / week   ? PT Duration 8 weeks   ? PT Treatment/Interventions ADLs/Self Care Home Management;Aquatic Therapy;Cryotherapy;Electrical Stimulation;Gait training;Ultrasound;Traction;Moist Heat;Stair training;Functional mobility training;Therapeutic activities;Therapeutic exercise;Balance training;Neuromuscular re-education;Manual techniques;Patient/family education;Passive range of motion;Dry needling;Taping   ? PT Next Visit Plan Cont with ther ex, proprioceptive activities, and flexibility.   ? PT [place]   ? Consulted and Agree with Plan of Care Patient   ? ?  ?  ? ?  ? ? ?Patient will benefit from skilled therapeutic intervention in order to improve the following deficits and impairments:  Abnormal gait, Decreased range of motion, Difficulty walking, Decreased activity tolerance, Pain, Improper body mechanics, Impaired flexibility, Decreased balance, Decreased scar mobility, Decreased strength, Postural dysfunction ? ?Visit Diagnosis: ?Difficulty in walking, not elsewhere classified ? ?Pain in left hip ? ?Muscle weakness (generalized) ? ?Unsteadiness on feet ? ? ? ? ?Problem List ?Patient Active Problem List  ? Diagnosis Date Noted  ? Pain due to onychomycosis of toenails of both feet 08/22/2021  ? Intertrochanteric fracture of left hip ([place]) 06/24/2021  ? Closed left hip fracture, initial encounter ([place]) 06/20/2021  ? Hypokalemia 06/20/2021  ? 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  ?  Cognitive complaints 06/02/2016  ? Hypoxemic respiratory failure, chronic ([place]) 09/19/2015  ? MCI (mild cognitive impairment) 03/06/2015  ? Depression with anxiety 03/06/2015  ? [place]ae 07/02/2014  ? History of MRSA infec

## 2022-03-16 ENCOUNTER — Encounter (HOSPITAL_BASED_OUTPATIENT_CLINIC_OR_DEPARTMENT_OTHER): Payer: Self-pay | Admitting: Physical Therapy

## 2022-03-16 ENCOUNTER — Other Ambulatory Visit: Payer: Self-pay

## 2022-03-16 ENCOUNTER — Ambulatory Visit (HOSPITAL_BASED_OUTPATIENT_CLINIC_OR_DEPARTMENT_OTHER): Payer: Medicare Other | Admitting: Physical Therapy

## 2022-03-16 DIAGNOSIS — R262 Difficulty in walking, not elsewhere classified: Secondary | ICD-10-CM | POA: Diagnosis not present

## 2022-03-16 DIAGNOSIS — M25552 Pain in left hip: Secondary | ICD-10-CM | POA: Diagnosis not present

## 2022-03-16 DIAGNOSIS — M6281 Muscle weakness (generalized): Secondary | ICD-10-CM

## 2022-03-16 DIAGNOSIS — R2681 Unsteadiness on feet: Secondary | ICD-10-CM

## 2022-03-16 NOTE — Therapy (Signed)
[facility] ?[place] [address] ?[place], [place], [address] ?Phone: [phone]   Fax:  [phone] ? ?Physical Therapy Treatment ? ?Patient Details  ?Name: Carrie Chung ?MRN: [id number] ?Date of Birth: 07-Feb-1941 ?Referring Provider (PT): Edmonia Lynch, MD ? ? ?Encounter Date: 03/16/2022 ? ? PT End of Session - 03/16/22 1129   ? ? Visit Number 28   ? Number of Visits 56   ? Date for PT Re-Evaluation 05/24/22   ? Authorization Type MCR   ? PT Start Time 1105   ? PT Stop Time 1140   ? PT Time Calculation (min) 35 min   ? Activity Tolerance Patient tolerated treatment well   ? Behavior During Therapy [facility] for tasks assessed/performed   ? ?  ?  ? ?  ? ? ?Past Medical History:  ?Diagnosis Date  ? Atrial tachycardia ([place])   ? a. noted at cardiac rehab 5/13; event monitor ordered to assess for AFib  ? CAD (coronary artery disease)   ? a. NSTEMI 03/2012 ([place] 03/27/12: pLAD 30%, mLAD 99%, mCFX 95%, mRCA 99% with thrombus, EF 65%);  s/p PTCA/DESx1 to prox-mid RCA 03/27/12 urgently in setting of hypotension/bradycardia, with staged PTCA/Evolve study stent to mid LAD & PTCA/Evolve study stent to prox LCx 03/29/12 ;   echo 03/28/12:EF 60%, Aortic sclerosis without AS, mild RVE, mild reduced RVSF    ? Endometrial polyp   ? HTN (hypertension)   ? Hyperlipidemia   ? MI, old   ? Sleep related hypoxia   ? ? ?Past Surgical History:  ?Procedure Laterality Date  ? APPENDECTOMY  81 years old  ? CAROTID STENT  04/2012  ? x3  ? [place]  ? CHOLECYSTECTOMY  1978  ? INTRAMEDULLARY (IM) NAIL INTERTROCHANTERIC Left 06/20/2021  ? Procedure: INTRAMEDULLARY (IM) NAIL INTERTROCHANTRIC;  Surgeon: Renette Butters, MD;  Location: [place];  Service: Orthopedics;  Laterality: Left;  ? LEFT HEART CATHETERIZATION WITH CORONARY ANGIOGRAM N/A 03/27/2012  ? Procedure: LEFT HEART CATHETERIZATION WITH CORONARY ANGIOGRAM;  Surgeon: Burnell Blanks, MD;  Location: [facility] CATH LAB;  Service: Cardiovascular;   Laterality: N/A;  ? PERCUTANEOUS CORONARY STENT INTERVENTION (PCI-S) N/A 03/27/2012  ? Procedure: PERCUTANEOUS CORONARY STENT INTERVENTION (PCI-S);  Surgeon: Burnell Blanks, MD;  Location: [facility] CATH LAB;  Service: Cardiovascular;  Laterality: N/A;  ? PERCUTANEOUS CORONARY STENT INTERVENTION (PCI-S) N/A 03/29/2012  ? Procedure: PERCUTANEOUS CORONARY STENT INTERVENTION (PCI-S);  Surgeon: [name], MD;  Location: [facility] CATH LAB;  Service: Cardiovascular;  Laterality: N/A;  ? TONSILLECTOMY  81 years old  ? ? ?There were no vitals filed for this visit. ? ? Subjective Assessment - 03/16/22 1106   ? ? Subjective Pt states the hip is hurting today. She had to stand for up to 2-2.5 hrs while at a social event.  She also had to walk about .5 miles to her grandchild's school. She states she was able to sleep on the L side over the weekend but had more pain.   ? How long can you stand comfortably? about 5 min- limited by pain and fatigued around lower back   ? Patient Stated Goals Pt wants to be able to stand for longer duration.   ? Currently in Pain? Yes   ? Pain Score 4    ? Pain Location Hip   ? Pain Descriptors / Indicators Aching;Shooting   ? Pain Onset More than a month ago   ? ?  ?  ? ?  ? ? ? ? ? ? ?  OBJECTIVE: ?Manual therapy: STM L quad- rec fem and VL  ? ?  ?Pt performed: ?  ?Seated SL raise 10x ?Prone quad stretch 30s 3x ?ITB stretch 5s 10x ?Seated flexion stretch with L foot on 8" box ?Seated figure 4 stretch with L leg cross 30s 3x ? -step up 6" box 2x10 fwd and lateral without UE support ?  ?  ?Kneel to stand transfers with UE support - demonstrated and edu given about technique and sequencing; trialed from mat table. Able to get into half kneeling but apprehension when trying to transition to standing ? ? ? ? ? ? ? ? ? ? PT Short Term Goals - 03/09/22 1124   ? ?  ? PT SHORT TERM GOAL #1  ? Title pt will demo hip flexion, abd and extension strength at least 4/5   ? Baseline see flowsheet   ? Time 4   ?  Period Weeks   ? Status Achieved   ?  ? PT SHORT TERM GOAL #2  ? Title pt will be safe in household ambulation without SPC.   ? Time 4   ? Period Weeks   ? Status Achieved   ?  ? PT SHORT TERM GOAL #3  ? Title pt will be able to navigate stairs with use of hand rail and no AD to go upstairs in her home   ? Time 4   ? Period Weeks   ? Status New   ? ?  ?  ? ?  ? ? ? ? PT Long Term Goals - 02/23/22 1312   ? ?  ? PT LONG TERM GOAL #1  ? Title Pt will be able to play with her grandchildren without limitations   ? Time 8   ? Period Weeks   ? Status Partially Met   ?  ? PT LONG TERM GOAL #2  ? Title pt will be able to ambulate for at least 1 hour without AD for functional ambulation   ? Baseline still using AD consistently,walking about 500 feet from car to pool and I need to sit because of pain and fatigue   ? Time 8   ? Period Weeks   ? Status Partially Met   ?  ? PT LONG TERM GOAL #3  ? Title pt will be able to stand and cook large meal without rest break   ? Baseline able to stand 45 mins   ? Time 8   ? Period Weeks   ? Status Achieved   ?  ? PT LONG TERM GOAL #4  ? Title pt will d/c to proper long term program including nustep for cardiac challenges   ? Time 8   ? Period Weeks   ? Status On-going   ?  ? PT LONG TERM GOAL #5  ? Title Pt will be able to demonstrate/report ability to walk >20 mins without pain in order to demonstrate functional improvement and tolerance to exercise and community mobility.   ? Time 8   ? Period Weeks   ? Status New   ? ?  ?  ? ?  ? ? ? ? ? ? ? ? Plan - 03/16/22 1107   ? ? Clinical Impression Statement Pt presented to session with increased quadricep and lateral hip/thigh pain that was reduced with STM and stretching. By end of session, pt able to perform knee extension and figure 4 sitting without increased pain. Pt session focus on improving mobility and reducing  pain. Pt had reduction in pain by end of session. Plan to continue with aggressive mobility as tolerated. Pt would benefit  from continued skilled therapy in order to reach goals and maximize functional L LE strength and ROM for prevention of future falls.   ? Examination-Activity Limitations Bathing;Locomotion Level;Bed Mobility;Bend;Sit;Caring for Others;Carry;Sleep;Squat;Stairs;Stand   ? Examination-Participation Restrictions Meal Prep;Cleaning;Community Activity   ? Rehab Potential Good   ? PT Frequency 2x / week   ? PT Duration 8 weeks   ? PT Treatment/Interventions ADLs/Self Care Home Management;Aquatic Therapy;Cryotherapy;Electrical Stimulation;Gait training;Ultrasound;Traction;Moist Heat;Stair training;Functional mobility training;Therapeutic activities;Therapeutic exercise;Balance training;Neuromuscular re-education;Manual techniques;Patient/family education;Passive range of motion;Dry needling;Taping   ? PT Next Visit Plan Cont with ther ex, proprioceptive activities, and flexibility.   ? PT [place]   ? Consulted and Agree with Plan of Care Patient   ? ?  ?  ? ?  ? ? ?Patient will benefit from skilled therapeutic intervention in order to improve the following deficits and impairments:  Abnormal gait, Decreased range of motion, Difficulty walking, Decreased activity tolerance, Pain, Improper body mechanics, Impaired flexibility, Decreased balance, Decreased scar mobility, Decreased strength, Postural dysfunction ? ?Visit Diagnosis: ?Difficulty in walking, not elsewhere classified ? ?Pain in left hip ? ?Muscle weakness (generalized) ? ?Unsteadiness on feet ? ? ? ? ?Problem List ?Patient Active Problem List  ? Diagnosis Date Noted  ? Pain due to onychomycosis of toenails of both feet 08/22/2021  ? Intertrochanteric fracture of left hip ([place]) 06/24/2021  ? Closed left hip fracture, initial encounter ([place]) 06/20/2021  ? Hypokalemia 06/20/2021  ? 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  ? Cognitive complaints 06/02/2016  ? Hypoxemic respiratory failure, chronic ([place]) 09/19/2015  ? MCI (mild cognitive impairment) 03/06/2015  ?  Depression with anxiety 03/06/2015  ? [place] 07/02/2014  ? History of MRSA infection 11/20/2012  ? Low back pain 07/07/2012  ? Insomnia 04/28/2012  ? Coronary Artery Disease 04/19/2012  ? Hyperlipidemia 04/

## 2022-03-19 ENCOUNTER — Ambulatory Visit (HOSPITAL_BASED_OUTPATIENT_CLINIC_OR_DEPARTMENT_OTHER): Payer: Medicare Other | Admitting: Physical Therapy

## 2022-03-19 ENCOUNTER — Encounter (HOSPITAL_BASED_OUTPATIENT_CLINIC_OR_DEPARTMENT_OTHER): Payer: Self-pay | Admitting: Physical Therapy

## 2022-03-19 DIAGNOSIS — M25552 Pain in left hip: Secondary | ICD-10-CM

## 2022-03-19 DIAGNOSIS — R2681 Unsteadiness on feet: Secondary | ICD-10-CM

## 2022-03-19 DIAGNOSIS — M6281 Muscle weakness (generalized): Secondary | ICD-10-CM

## 2022-03-19 DIAGNOSIS — R262 Difficulty in walking, not elsewhere classified: Secondary | ICD-10-CM

## 2022-03-19 NOTE — Therapy (Addendum)
[facility] ?[place] ?[address] ?[place], [place], [address] ?Phone: [phone]   Fax:  [phone] ? ?Physical Therapy Treatment ? ?Patient Details  ?Name: Carrie Chung ?MRN: [id number] ?Date of Birth: 1941/03/21 ?Referring Provider (PT): Edmonia Lynch, MD ? ? ?Encounter Date: 03/19/2022 ? ? PT End of Session - 03/19/22 1105   ? ? Visit Number 65   ? Number of Visits 56   ? Date for PT Re-Evaluation 05/24/22   ? Authorization Type MCR   ? PT Start Time 1100   ? PT Stop Time 1140   ? PT Time Calculation (min) 40 min   ? Activity Tolerance Patient tolerated treatment well   ? Behavior During Therapy [facility] for tasks assessed/performed   ? ?  ?  ? ?  ? ? ?Past Medical History:  ?Diagnosis Date  ? Atrial tachycardia ([place])   ? a. noted at cardiac rehab 5/13; event monitor ordered to assess for AFib  ? CAD (coronary artery disease)   ? a. NSTEMI 03/2012 ([place] 03/27/12: pLAD 30%, mLAD 99%, mCFX 95%, mRCA 99% with thrombus, EF 65%);  s/p PTCA/DESx1 to prox-mid RCA 03/27/12 urgently in setting of hypotension/bradycardia, with staged PTCA/Evolve study stent to mid LAD & PTCA/Evolve study stent to prox LCx 03/29/12 ;   echo 03/28/12:EF 60%, Aortic sclerosis without AS, mild RVE, mild reduced RVSF    ? Endometrial polyp   ? HTN (hypertension)   ? Hyperlipidemia   ? MI, old   ? Sleep related hypoxia   ? ? ?Past Surgical History:  ?Procedure Laterality Date  ? APPENDECTOMY  81 years old  ? CAROTID STENT  04/2012  ? x3  ? [place]  ? CHOLECYSTECTOMY  1978  ? INTRAMEDULLARY (IM) NAIL INTERTROCHANTERIC Left 06/20/2021  ? Procedure: INTRAMEDULLARY (IM) NAIL INTERTROCHANTRIC;  Surgeon: Renette Butters, MD;  Location: [place];  Service: Orthopedics;  Laterality: Left;  ? LEFT HEART CATHETERIZATION WITH CORONARY ANGIOGRAM N/A 03/27/2012  ? Procedure: LEFT HEART CATHETERIZATION WITH CORONARY ANGIOGRAM;  Surgeon: Burnell Blanks, MD;  Location: [facility] CATH LAB;  Service: Cardiovascular;   Laterality: N/A;  ? PERCUTANEOUS CORONARY STENT INTERVENTION (PCI-S) N/A 03/27/2012  ? Procedure: PERCUTANEOUS CORONARY STENT INTERVENTION (PCI-S);  Surgeon: Burnell Blanks, MD;  Location: [facility] CATH LAB;  Service: Cardiovascular;  Laterality: N/A;  ? PERCUTANEOUS CORONARY STENT INTERVENTION (PCI-S) N/A 03/29/2012  ? Procedure: PERCUTANEOUS CORONARY STENT INTERVENTION (PCI-S);  Surgeon: [name], MD;  Location: [facility] CATH LAB;  Service: Cardiovascular;  Laterality: N/A;  ? TONSILLECTOMY  81 years old  ? ? ?There were no vitals filed for this visit. ? ? Subjective Assessment - 03/19/22 1102   ? ? Subjective Pt states the hip is a little better today. She is still sore laterally and anteriorly.   ? How long can you stand comfortably? about 5 min- limited by pain and fatigued around lower back   ? Patient Stated Goals Pt wants to be able to stand for longer duration.   ? Currently in Pain? Yes   ? Pain Score 2    ? Pain Location Hip   ? Pain Descriptors / Indicators Sore   ? Pain Onset More than a month ago   ? ?  ?  ? ?  ? ? ? ? ? ? ?OBJECTIVE: ?Manual therapy: STM L quad- rec fem and VL  ? ?  ?Pt performed: ? ?Recumbent bike L1 6 min seat 3 ?  ?Seated SL raise  2x10 ?STS 5lb weight at low mat table height 2x10 ?Toe walk along table 4x laps ?Seated figure 4 stretch with L leg cross 30s 3x ? step up 6" box 2x10 fwd and lateral without UE support ?Retro stepping 4x along table ?Toe walking 4x along table ?  ? ? ? PT Short Term Goals - 03/09/22 1124   ? ?  ? PT SHORT TERM GOAL #1  ? Title pt will demo hip flexion, abd and extension strength at least 4/5   ? Baseline see flowsheet   ? Time 4   ? Period Weeks   ? Status Achieved   ?  ? PT SHORT TERM GOAL #2  ? Title pt will be safe in household ambulation without SPC.   ? Time 4   ? Period Weeks   ? Status Achieved   ?  ? PT SHORT TERM GOAL #3  ? Title pt will be able to navigate stairs with use of hand rail and no AD to go upstairs in her home   ? Time 4   ? Period  Weeks   ? Status New   ? ?  ?  ? ?  ? ? ? ? PT Long Term Goals - 02/23/22 1312   ? ?  ? PT LONG TERM GOAL #1  ? Title Pt will be able to play with her grandchildren without limitations   ? Time 8   ? Period Weeks   ? Status Partially Met   ?  ? PT LONG TERM GOAL #2  ? Title pt will be able to ambulate for at least 1 hour without AD for functional ambulation   ? Baseline still using AD consistently,walking about 500 feet from car to pool and I need to sit because of pain and fatigue   ? Time 8   ? Period Weeks   ? Status Partially Met   ?  ? PT LONG TERM GOAL #3  ? Title pt will be able to stand and cook large meal without rest break   ? Baseline able to stand 45 mins   ? Time 8   ? Period Weeks   ? Status Achieved   ?  ? PT LONG TERM GOAL #4  ? Title pt will d/c to proper long term program including nustep for cardiac challenges   ? Time 8   ? Period Weeks   ? Status On-going   ?  ? PT LONG TERM GOAL #5  ? Title Pt will be able to demonstrate/report ability to walk >20 mins without pain in order to demonstrate functional improvement and tolerance to exercise and community mobility.   ? Time 8   ? Period Weeks   ? Status New   ? ?  ?  ? ?  ? ? ? ? ? ? ? ? Plan - 03/19/22 1145   ? ? Clinical Impression Statement Pt able to progress mobility and LE strengthening exercise at today's session without increasing pain. Pt had difficulty with dynamic balance activity today with UE support needed but was able to tolerate standing exercise without signficnat LOB. Pt with continued L LE strength strenght deficits but progressing much better today vs last session. Plan to continue with aggressive mobility as tolerated. Pt would benefit from continued skilled therapy in order to reach goals and maximize functional L LE strength and ROM for prevention of future falls.   ? Examination-Activity Limitations Bathing;Locomotion Level;Bed Mobility;Bend;Sit;Caring for Others;Carry;Sleep;Squat;Stairs;Stand   ? Examination-Participation  Restrictions Meal Prep;Cleaning;Community Activity   ? Rehab Potential Good   ? PT Frequency 2x / week   ? PT Duration 8 weeks   ? PT Treatment/Interventions ADLs/Self Care Home Management;Aquatic Therapy;Cryotherapy;Electrical Stimulation;Gait training;Ultrasound;Traction;Moist Heat;Stair training;Functional mobility training;Therapeutic activities;Therapeutic exercise;Balance training;Neuromuscular re-education;Manual techniques;Patient/family education;Passive range of motion;Dry needling;Taping   ? PT Next Visit Plan Cont with ther ex, proprioceptive activities, and flexibility.   ? PT [place]   ? Consulted and Agree with Plan of Care Patient   ? ?  ?  ? ?  ? ? ? ?Patient will benefit from skilled therapeutic intervention in order to improve the following deficits and impairments:  Abnormal gait, Decreased range of motion, Difficulty walking, Decreased activity tolerance, Pain, Improper body mechanics, Impaired flexibility, Decreased balance, Decreased scar mobility, Decreased strength, Postural dysfunction ? ?Visit Diagnosis: ?Difficulty in walking, not elsewhere classified ? ?Pain in left hip ? ?Muscle weakness (generalized) ? ?Unsteadiness on feet ? ? ? ? ?Problem List ?Patient Active Problem List  ? Diagnosis Date Noted  ? Pain due to onychomycosis of toenails of both feet 08/22/2021  ? Intertrochanteric fracture of left hip ([place]) 06/24/2021  ? Closed left hip fracture, initial encounter ([place]) 06/20/2021  ? Hypokalemia 06/20/2021  ? 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  ? Cognitive complaints 06/02/2016  ? Hypoxemic respiratory failure, chronic ([place]) 09/19/2015  ? MCI (mild cognitive impairment) 03/06/2015  ? Depression with anxiety 03/06/2015  ? [place] 07/02/2014  ? History of MRSA infection 11/20/2012  ? Low back pain 07/07/2012  ? Insomnia 04/28/2012  ? Coronary Artery Disease 04/19/2012  ? Hyperlipidemia 04/19/2012  ? Anxiety 04/19/2012  ? Non-ST elevation myocardial infarction (NSTEMI), initial  care episode ([place]) 03/28/2012  ? HYPERTENSION, BENIGN 06/12/2010  ? Dyspnea 06/12/2010  ? Prolonged QT interval 06/12/2010  ? ? ?Daleen Bo, PT ?03/19/2022, 11:47 AM ? ?[place] ?[facility]

## 2022-03-23 ENCOUNTER — Ambulatory Visit (HOSPITAL_BASED_OUTPATIENT_CLINIC_OR_DEPARTMENT_OTHER): Payer: Medicare Other | Attending: Physical Medicine and Rehabilitation | Admitting: Physical Therapy

## 2022-03-23 ENCOUNTER — Encounter (HOSPITAL_BASED_OUTPATIENT_CLINIC_OR_DEPARTMENT_OTHER): Payer: Self-pay | Admitting: Physical Therapy

## 2022-03-23 DIAGNOSIS — R262 Difficulty in walking, not elsewhere classified: Secondary | ICD-10-CM | POA: Insufficient documentation

## 2022-03-23 DIAGNOSIS — M25552 Pain in left hip: Secondary | ICD-10-CM | POA: Insufficient documentation

## 2022-03-23 DIAGNOSIS — M6281 Muscle weakness (generalized): Secondary | ICD-10-CM | POA: Diagnosis not present

## 2022-03-23 DIAGNOSIS — R2681 Unsteadiness on feet: Secondary | ICD-10-CM | POA: Diagnosis not present

## 2022-03-23 NOTE — Therapy (Signed)
[facility] ?[place], [place], [address] ?Phone: [phone]   Fax:  [phone] ? ?Physical Therapy Treatment ? ?Patient Details  ?Name: Carrie Chung ?MRN: [id number] ?Date of Birth: 1941-09-20 ?Referring Provider (PT): Edmonia Lynch, MD ? ? ?Encounter Date: 03/23/2022 ? ? PT End of Session - 03/23/22 1110   ? ? Visit Number 48   ? Number of Visits 56   ? Date for PT Re-Evaluation 05/24/22   ? Authorization Type MCR   ? PT Start Time 1105   ? PT Stop Time 1140   ? PT Time Calculation (min) 35 min   ? Activity Tolerance Patient tolerated treatment well   ? Behavior During Therapy [facility] for tasks assessed/performed   ? ?  ?  ? ?  ? ? ?Past Medical History:  ?Diagnosis Date  ? Atrial tachycardia ([place])   ? a. noted at cardiac rehab 5/13; event monitor ordered to assess for AFib  ? CAD (coronary artery disease)   ? a. NSTEMI 03/2012 ([place] 03/27/12: pLAD 30%, mLAD 99%, mCFX 95%, mRCA 99% with thrombus, EF 65%);  s/p PTCA/DESx1 to prox-mid RCA 03/27/12 urgently in setting of hypotension/bradycardia, with staged PTCA/Evolve study stent to mid LAD & PTCA/Evolve study stent to prox LCx 03/29/12 ;   echo 03/28/12:EF 60%, Aortic sclerosis without AS, mild RVE, mild reduced RVSF    ? Endometrial polyp   ? HTN (hypertension)   ? Hyperlipidemia   ? MI, old   ? Sleep related hypoxia   ? ? ?Past Surgical History:  ?Procedure Laterality Date  ? APPENDECTOMY  81 years old  ? CAROTID STENT  04/2012  ? x3  ? [place]  ? CHOLECYSTECTOMY  1978  ? INTRAMEDULLARY (IM) NAIL INTERTROCHANTERIC Left 06/20/2021  ? Procedure: INTRAMEDULLARY (IM) NAIL INTERTROCHANTRIC;  Surgeon: Renette Butters, MD;  Location: [place];  Service: Orthopedics;  Laterality: Left;  ? LEFT HEART CATHETERIZATION WITH CORONARY ANGIOGRAM N/A 03/27/2012  ? Procedure: LEFT HEART CATHETERIZATION WITH CORONARY ANGIOGRAM;  Surgeon: Burnell Blanks, MD;  Location: [facility] CATH LAB;  Service: Cardiovascular;   Laterality: N/A;  ? PERCUTANEOUS CORONARY STENT INTERVENTION (PCI-S) N/A 03/27/2012  ? Procedure: PERCUTANEOUS CORONARY STENT INTERVENTION (PCI-S);  Surgeon: Burnell Blanks, MD;  Location: [facility] CATH LAB;  Service: Cardiovascular;  Laterality: N/A;  ? PERCUTANEOUS CORONARY STENT INTERVENTION (PCI-S) N/A 03/29/2012  ? Procedure: PERCUTANEOUS CORONARY STENT INTERVENTION (PCI-S);  Surgeon: [name], MD;  Location: [facility] CATH LAB;  Service: Cardiovascular;  Laterality: N/A;  ? TONSILLECTOMY  81 years old  ? ? ?There were no vitals filed for this visit. ? ? Subjective Assessment - 03/23/22 1107   ? ? Subjective Pt states she is sleeping on the R side more often now. She was stiff and slow this morning. She states she was stooping more and lunging more. She states that the R knee is now taking more of the force now.   ? How long can you stand comfortably? about 5 min- limited by pain and fatigued around lower back   ? Patient Stated Goals Pt wants to be able to stand for longer duration.   ? Currently in Pain? Yes   ? Pain Score 2    ? Pain Location Hip   ? Pain Descriptors / Indicators Sore   ? Pain Onset More than a month ago   ? ?  ?  ? ?  ? ? ? ? ? ?Pt performed: ?  ?  Recumbent bike L1 8 min seat 3 ?  ?Seated SL raise 2x10 ?STS 6lb weight at low mat table height 2x10 ?Toe walk along table 5x laps ?Seated figure 4 stretch with L leg cross 30s 3x ? step up 6" box 2x10 fwd and lateral without UE support ?Retro stepping 4x along table ?Suitcase carry 6lb 25f x2 ?Toe walking 4x along table ? ? ? ? ? ? ? ? ? ? ? ? ? ? ? ? ? ? ? ? PT Education - 03/23/22 1108   ? ? Education Details anatomy, exercise progression, muscle firing, HEP   ? Person(s) Educated Patient   ? Methods Explanation;Demonstration   ? Comprehension Verbalized understanding;Returned demonstration   ? ?  ?  ? ?  ? ? ? PT Short Term Goals - 03/09/22 1124   ? ?  ? PT SHORT TERM GOAL #1  ? Title pt will demo hip flexion, abd and extension strength at least  4/5   ? Baseline see flowsheet   ? Time 4   ? Period Weeks   ? Status Achieved   ?  ? PT SHORT TERM GOAL #2  ? Title pt will be safe in household ambulation without SPC.   ? Time 4   ? Period Weeks   ? Status Achieved   ?  ? PT SHORT TERM GOAL #3  ? Title pt will be able to navigate stairs with use of hand rail and no AD to go upstairs in her home   ? Time 4   ? Period Weeks   ? Status New   ? ?  ?  ? ?  ? ? ? ? PT Long Term Goals - 02/23/22 1312   ? ?  ? PT LONG TERM GOAL #1  ? Title Pt will be able to play with her grandchildren without limitations   ? Time 8   ? Period Weeks   ? Status Partially Met   ?  ? PT LONG TERM GOAL #2  ? Title pt will be able to ambulate for at least 1 hour without AD for functional ambulation   ? Baseline still using AD consistently,walking about 500 feet from car to pool and I need to sit because of pain and fatigue   ? Time 8   ? Period Weeks   ? Status Partially Met   ?  ? PT LONG TERM GOAL #3  ? Title pt will be able to stand and cook large meal without rest break   ? Baseline able to stand 45 mins   ? Time 8   ? Period Weeks   ? Status Achieved   ?  ? PT LONG TERM GOAL #4  ? Title pt will d/c to proper long term program including nustep for cardiac challenges   ? Time 8   ? Period Weeks   ? Status On-going   ?  ? PT LONG TERM GOAL #5  ? Title Pt will be able to demonstrate/report ability to walk >20 mins without pain in order to demonstrate functional improvement and tolerance to exercise and community mobility.   ? Time 8   ? Period Weeks   ? Status New   ? ?  ?  ? ?  ? ? ? ? ? ? ? ? Plan - 03/23/22 1109   ? ? Clinical Impression Statement Pt able to continue with functional LE stability and strength focus. Pt able to increase volume and duration of exercise  with only fatigue but no pain. Pt still with difficulty with dynamic balance activity today requires less intermittent UE support and VC for upright posture/gaze. Pt is continuing to improving functional mobility and tolerance  to exercise from week to week. Plan to start decreasing frequency of visits in order to transition over to [employer] personal training. Pt would benefit from continued skilled therapy in order to reach goals and maximize functional L LE strength and ROM for prevention of future falls.   ? Examination-Activity Limitations Bathing;Locomotion Level;Bed Mobility;Bend;Sit;Caring for Others;Carry;Sleep;Squat;Stairs;Stand   ? Examination-Participation Restrictions Meal Prep;Cleaning;Community Activity   ? Rehab Potential Good   ? PT Frequency 2x / week   ? PT Duration 8 weeks   ? PT Treatment/Interventions ADLs/Self Care Home Management;Aquatic Therapy;Cryotherapy;Electrical Stimulation;Gait training;Ultrasound;Traction;Moist Heat;Stair training;Functional mobility training;Therapeutic activities;Therapeutic exercise;Balance training;Neuromuscular re-education;Manual techniques;Patient/family education;Passive range of motion;Dry needling;Taping   ? PT Next Visit Plan Cont with ther ex, proprioceptive activities, and flexibility.   ? PT [place]   ? Consulted and Agree with Plan of Care Patient   ? ?  ?  ? ?  ? ? ?Patient will benefit from skilled therapeutic intervention in order to improve the following deficits and impairments:  Abnormal gait, Decreased range of motion, Difficulty walking, Decreased activity tolerance, Pain, Improper body mechanics, Impaired flexibility, Decreased balance, Decreased scar mobility, Decreased strength, Postural dysfunction ? ?Visit Diagnosis: ?Difficulty in walking, not elsewhere classified ? ?Pain in left hip ? ?Muscle weakness (generalized) ? ?Unsteadiness on feet ? ? ? ? ?Problem List ?Patient Active Problem List  ? Diagnosis Date Noted  ? Pain due to onychomycosis of toenails of both feet 08/22/2021  ? Intertrochanteric fracture of left hip ([place]) 06/24/2021  ? Closed left hip fracture, initial encounter ([place]) 06/20/2021  ? Hypokalemia 06/20/2021  ? Olfactory aura  12/29/2017  ? Cognitive complaints 06/02/2016  ? Hypoxemic respiratory failure, chronic ([place]) 09/19/2015  ? MCI (mild cognitive impairment) 03/06/2015  ? Depression with anxiety 03/06/2015  ? [place] 07

## 2022-03-26 ENCOUNTER — Ambulatory Visit (HOSPITAL_BASED_OUTPATIENT_CLINIC_OR_DEPARTMENT_OTHER): Payer: Medicare Other | Admitting: Physical Therapy

## 2022-03-26 ENCOUNTER — Encounter (HOSPITAL_BASED_OUTPATIENT_CLINIC_OR_DEPARTMENT_OTHER): Payer: Self-pay | Admitting: Physical Therapy

## 2022-03-26 DIAGNOSIS — M25552 Pain in left hip: Secondary | ICD-10-CM

## 2022-03-26 DIAGNOSIS — R262 Difficulty in walking, not elsewhere classified: Secondary | ICD-10-CM | POA: Diagnosis not present

## 2022-03-26 DIAGNOSIS — R2681 Unsteadiness on feet: Secondary | ICD-10-CM | POA: Diagnosis not present

## 2022-03-26 DIAGNOSIS — M6281 Muscle weakness (generalized): Secondary | ICD-10-CM | POA: Diagnosis not present

## 2022-03-26 NOTE — Therapy (Signed)
[facility] [address] ?[place], [place], [address] ?Phone: [phone]   Fax:  [phone] ? ?Physical Therapy Treatment ? ?Patient Details  ?Name: Carrie Chung ?MRN: [id number] ?Date of Birth: Jun 12, 1941 ?Referring Provider (PT): Edmonia Lynch, MD ? ? ?Encounter Date: 03/26/2022 ? ? PT End of Session - 03/26/22 1107   ? ? Visit Number 84   ? Number of Visits 56   ? Date for PT Re-Evaluation 05/24/22   ? Authorization Type MCR   ? PT Start Time 1101   ? PT Stop Time 1140   ? PT Time Calculation (min) 39 min   ? Activity Tolerance Patient tolerated treatment well   ? Behavior During Therapy [facility] for tasks assessed/performed   ? ?  ?  ? ?  ? ? ?Past Medical History:  ?Diagnosis Date  ? Atrial tachycardia ([place])   ? a. noted at cardiac rehab 5/13; event monitor ordered to assess for AFib  ? CAD (coronary artery disease)   ? a. NSTEMI 03/2012 ([place] 03/27/12: pLAD 30%, mLAD 99%, mCFX 95%, mRCA 99% with thrombus, EF 65%);  s/p PTCA/DESx1 to prox-mid RCA 03/27/12 urgently in setting of hypotension/bradycardia, with staged PTCA/Evolve study stent to mid LAD & PTCA/Evolve study stent to prox LCx 03/29/12 ;   echo 03/28/12:EF 60%, Aortic sclerosis without AS, mild RVE, mild reduced RVSF    ? Endometrial polyp   ? HTN (hypertension)   ? Hyperlipidemia   ? MI, old   ? Sleep related hypoxia   ? ? ?Past Surgical History:  ?Procedure Laterality Date  ? APPENDECTOMY  81 years old  ? CAROTID STENT  04/2012  ? x3  ? [place]  ? CHOLECYSTECTOMY  1978  ? INTRAMEDULLARY (IM) NAIL INTERTROCHANTERIC Left 06/20/2021  ? Procedure: INTRAMEDULLARY (IM) NAIL INTERTROCHANTRIC;  Surgeon: Renette Butters, MD;  Location: [facility];  Service: Orthopedics;  Laterality: Left;  ? LEFT HEART CATHETERIZATION WITH CORONARY ANGIOGRAM N/A 03/27/2012  ? Procedure: LEFT HEART CATHETERIZATION WITH CORONARY ANGIOGRAM;  Surgeon: Burnell Blanks, MD;  Location: [facility] CATH LAB;  Service: Cardiovascular;   Laterality: N/A;  ? PERCUTANEOUS CORONARY STENT INTERVENTION (PCI-S) N/A 03/27/2012  ? Procedure: PERCUTANEOUS CORONARY STENT INTERVENTION (PCI-S);  Surgeon: Burnell Blanks, MD;  Location: [facility] CATH LAB;  Service: Cardiovascular;  Laterality: N/A;  ? PERCUTANEOUS CORONARY STENT INTERVENTION (PCI-S) N/A 03/29/2012  ? Procedure: PERCUTANEOUS CORONARY STENT INTERVENTION (PCI-S);  Surgeon: [name], MD;  Location: [facility] CATH LAB;  Service: Cardiovascular;  Laterality: N/A;  ? TONSILLECTOMY  81 years old  ? ? ?There were no vitals filed for this visit. ? ? Subjective Assessment - 03/26/22 1101   ? ? Subjective Pt states she felt much better after last session. She states she was moving much better after. The lateral part of the hip is still painful.   ? How long can you stand comfortably? about 5 min- limited by pain and fatigued around lower back   ? Patient Stated Goals Pt wants to be able to stand for longer duration.   ? Currently in Pain? Yes   ? Pain Score 2    ? Pain Location Hip   ? Pain Descriptors / Indicators Sore   ? Pain Onset More than a month ago   ? ?  ?  ? ?  ? ?  ?Pt performed: ?  ?Recumbent bike L1 8 min seat 3 ?  ?STS 8lb weight at low mat table height 2x10 ?  15lb KB half deadlift from 14" box height 2x10 ?Toe walk along table 5x laps ?Seated figure 4 stretch with L leg cross 30s 3x ? step up 6" box 2x10 fwd and lateral without UE support ?Retro stepping 4x along table ?Suitcase carry 8lb 19f x2 ?Toe walking 4x along table ?  ?  ? PT Education - 03/26/22 1110   ? ? Education Details exercise progression, muscle firing, HEP   ? Person(s) Educated Patient   ? Methods Explanation;Demonstration   ? Comprehension Verbalized understanding;Returned demonstration   ? ?  ?  ? ?  ? ? ? PT Short Term Goals - 03/09/22 1124   ? ?  ? PT SHORT TERM GOAL #1  ? Title pt will demo hip flexion, abd and extension strength at least 4/5   ? Baseline see flowsheet   ? Time 4   ? Period Weeks   ? Status Achieved   ?  ?  PT SHORT TERM GOAL #2  ? Title pt will be safe in household ambulation without SPC.   ? Time 4   ? Period Weeks   ? Status Achieved   ?  ? PT SHORT TERM GOAL #3  ? Title pt will be able to navigate stairs with use of hand rail and no AD to go upstairs in her home   ? Time 4   ? Period Weeks   ? Status New   ? ?  ?  ? ?  ? ? ? ? PT Long Term Goals - 02/23/22 1312   ? ?  ? PT LONG TERM GOAL #1  ? Title Pt will be able to play with her grandchildren without limitations   ? Time 8   ? Period Weeks   ? Status Partially Met   ?  ? PT LONG TERM GOAL #2  ? Title pt will be able to ambulate for at least 1 hour without AD for functional ambulation   ? Baseline still using AD consistently,walking about 500 feet from car to pool and I need to sit because of pain and fatigue   ? Time 8   ? Period Weeks   ? Status Partially Met   ?  ? PT LONG TERM GOAL #3  ? Title pt will be able to stand and cook large meal without rest break   ? Baseline able to stand 45 mins   ? Time 8   ? Period Weeks   ? Status Achieved   ?  ? PT LONG TERM GOAL #4  ? Title pt will d/c to proper long term program including nustep for cardiac challenges   ? Time 8   ? Period Weeks   ? Status On-going   ?  ? PT LONG TERM GOAL #5  ? Title Pt will be able to demonstrate/report ability to walk >20 mins without pain in order to demonstrate functional improvement and tolerance to exercise and community mobility.   ? Time 8   ? Period Weeks   ? Status New   ? ?  ?  ? ?  ? ? ? ? ? ? ? ? Plan - 03/26/22 1105   ? ? Clinical Impression Statement Pt with good tolerance to increase of intensity and volume of LE loading at today's session. Pt with unsteadiness into L LE due to fatigue with dynamic balance exercise. However, pt is able to self correct and has good safety awareness. Pt appears to be responding very well to  strength and functional loading. Plan to start decreasing frequency of visits in order to transition over to [employer] personal training. Trial lifting  again at next session. Pt would benefit from continued skilled therapy in order to reach goals and maximize functional L LE strength and ROM for prevention of future falls.   ? Examination-Activity Limitations Bathing;Locomotion Level;Bed Mobility;Bend;Sit;Caring for Others;Carry;Sleep;Squat;Stairs;Stand   ? Examination-Participation Restrictions Meal Prep;Cleaning;Community Activity   ? Rehab Potential Good   ? PT Frequency 2x / week   ? PT Duration 8 weeks   ? PT Treatment/Interventions ADLs/Self Care Home Management;Aquatic Therapy;Cryotherapy;Electrical Stimulation;Gait training;Ultrasound;Traction;Moist Heat;Stair training;Functional mobility training;Therapeutic activities;Therapeutic exercise;Balance training;Neuromuscular re-education;Manual techniques;Patient/family education;Passive range of motion;Dry needling;Taping   ? PT Next Visit Plan Cont with ther ex, proprioceptive activities, and flexibility.   ? PT [place]   ? Consulted and Agree with Plan of Care Patient   ? ?  ?  ? ?  ? ? ?Patient will benefit from skilled therapeutic intervention in order to improve the following deficits and impairments:  Abnormal gait, Decreased range of motion, Difficulty walking, Decreased activity tolerance, Pain, Improper body mechanics, Impaired flexibility, Decreased balance, Decreased scar mobility, Decreased strength, Postural dysfunction ? ?Visit Diagnosis: ?Difficulty in walking, not elsewhere classified ? ?Pain in left hip ? ?Muscle weakness (generalized) ? ?Unsteadiness on feet ? ? ? ? ?Problem List ?Patient Active Problem List  ? Diagnosis Date Noted  ? Pain due to onychomycosis of toenails of both feet 08/22/2021  ? Intertrochanteric fracture of left hip ([place]) 06/24/2021  ? Closed left hip fracture, initial encounter ([place]) 06/20/2021  ? Hypokalemia 06/20/2021  ? 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  ? Cognitive complaints 06/02/2016  ? Hypoxemic respiratory failure, chronic ([place]) 09/19/2015  ? MCI  (mild cognitive impairment) 03/06/2015  ? Depression with anxiety 03/06/2015  ? [place] 07/02/2014  ? History of MRSA infection 11/20/2012  ? Low back pain 07/07/2012  ? Insomnia 04/28/2012  ? Coronary Arter

## 2022-04-02 ENCOUNTER — Encounter (HOSPITAL_BASED_OUTPATIENT_CLINIC_OR_DEPARTMENT_OTHER): Payer: Self-pay | Admitting: Physical Therapy

## 2022-04-02 ENCOUNTER — Ambulatory Visit (HOSPITAL_BASED_OUTPATIENT_CLINIC_OR_DEPARTMENT_OTHER): Payer: Medicare Other | Admitting: Physical Therapy

## 2022-04-02 DIAGNOSIS — R262 Difficulty in walking, not elsewhere classified: Secondary | ICD-10-CM

## 2022-04-02 DIAGNOSIS — M6281 Muscle weakness (generalized): Secondary | ICD-10-CM | POA: Diagnosis not present

## 2022-04-02 DIAGNOSIS — R2681 Unsteadiness on feet: Secondary | ICD-10-CM | POA: Diagnosis not present

## 2022-04-02 DIAGNOSIS — M25552 Pain in left hip: Secondary | ICD-10-CM | POA: Diagnosis not present

## 2022-04-02 NOTE — Therapy (Signed)
?OUTPATIENT PHYSICAL THERAPY TREATMENT NOTE ? ? ?Patient Name: Carrie Chung ?MRN: [id number] ?DOB:07/11/1941, 81 y.o., female ?Today's Date: 04/03/2022 ? ?PCP: [name], MD ?REFERRING PROVIDER:Timothy Percell Miller, MD ? ? PT End of Session - 04/02/22 1352   ? ? Visit Number [id number]   ? Number of Visits 56   ? Date for PT Re-Evaluation 05/24/22   ? Authorization Type MCR   ? PT Start Time 1350   ? PT Stop Time 4854   ? PT Time Calculation (min) 42 min   ? Activity Tolerance Patient tolerated treatment well   ? Behavior During Therapy [facility] for tasks assessed/performed   ? ?  ?  ? ?  ? ? ?Past Medical History:  ?Diagnosis Date  ? Atrial tachycardia ([place])   ? a. noted at cardiac rehab 5/13; event monitor ordered to assess for AFib  ? CAD (coronary artery disease)   ? a. NSTEMI 03/2012 ([place] 03/27/12: pLAD 30%, mLAD 99%, mCFX 95%, mRCA 99% with thrombus, EF 65%);  s/p PTCA/DESx1 to prox-mid RCA 03/27/12 urgently in setting of hypotension/bradycardia, with staged PTCA/Evolve study stent to mid LAD & PTCA/Evolve study stent to prox LCx 03/29/12 ;   echo 03/28/12:EF 60%, Aortic sclerosis without AS, mild RVE, mild reduced RVSF    ? Endometrial polyp   ? HTN (hypertension)   ? Hyperlipidemia   ? MI, old   ? Sleep related hypoxia   ? ?Past Surgical History:  ?Procedure Laterality Date  ? APPENDECTOMY  81 years old  ? CAROTID STENT  04/2012  ? x3  ? [place]  ? CHOLECYSTECTOMY  1978  ? INTRAMEDULLARY (IM) NAIL INTERTROCHANTERIC Left 06/20/2021  ? Procedure: INTRAMEDULLARY (IM) NAIL INTERTROCHANTRIC;  Surgeon: Renette Butters, MD;  Location: [facility];  Service: Orthopedics;  Laterality: Left;  ? LEFT HEART CATHETERIZATION WITH CORONARY ANGIOGRAM N/A 03/27/2012  ? Procedure: LEFT HEART CATHETERIZATION WITH CORONARY ANGIOGRAM;  Surgeon: Burnell Blanks, MD;  Location: [facility] CATH LAB;  Service: Cardiovascular;  Laterality: N/A;  ? PERCUTANEOUS CORONARY STENT INTERVENTION (PCI-S) N/A 03/27/2012  ? Procedure: PERCUTANEOUS CORONARY  STENT INTERVENTION (PCI-S);  Surgeon: Burnell Blanks, MD;  Location: [facility] CATH LAB;  Service: Cardiovascular;  Laterality: N/A;  ? PERCUTANEOUS CORONARY STENT INTERVENTION (PCI-S) N/A 03/29/2012  ? Procedure: PERCUTANEOUS CORONARY STENT INTERVENTION (PCI-S);  Surgeon: [name], MD;  Location: [facility] CATH LAB;  Service: Cardiovascular;  Laterality: N/A;  ? TONSILLECTOMY  81 years old  ? ?Patient Active Problem List  ? Diagnosis Date Noted  ? Pain due to onychomycosis of toenails of both feet 08/22/2021  ? Intertrochanteric fracture of left hip ([place]) 06/24/2021  ? Closed left hip fracture, initial encounter ([place]) 06/20/2021  ? Hypokalemia 06/20/2021  ? 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  ? Cognitive complaints 06/02/2016  ? Hypoxemic respiratory failure, chronic ([place]) 09/19/2015  ? MCI (mild cognitive impairment) 03/06/2015  ? Depression with anxiety 03/06/2015  ? Bullae 07/02/2014  ? History of MRSA infection 11/20/2012  ? Low back pain 07/07/2012  ? Insomnia 04/28/2012  ? Coronary Artery Disease 04/19/2012  ? Hyperlipidemia 04/19/2012  ? Anxiety 04/19/2012  ? Non-ST elevation myocardial infarction (NSTEMI), initial care episode ([place]) 03/28/2012  ? HYPERTENSION, BENIGN 06/12/2010  ? Dyspnea 06/12/2010  ? Prolonged QT interval 06/12/2010  ? ? ?REFERRING DIAG: s/p IM Nail Lt femur, closed reduction and manipultion of Lt hip fx, DOS 06/20/21 ? ?THERAPY DIAG:  ?Difficulty in walking, not elsewhere classified ? ?Pain in left hip ? ?Muscle weakness (  generalized) ? ?PERTINENT HISTORY: MCI, depression with anxiety, dyspnea ? ?PRECAUTIONS: fall ? ?SUBJECTIVE: still cannot lay on my left side. I am starting to walk like an old lady, I am very aware of objects. Cannot get up and down off of the floor with my grand children.  ? ?PAIN:  ?Are you having pain? Not really ? ? ? ? ?TODAY'S TREATMENT:  ?4/6: ?Recumbent bike L1 8 min seat 3 ?  ?STS 8lb weight at low mat table height 2x10 ?15lb KB half deadlift from 14" box height 2x10 ?Toe  walk along table 5x laps ?Seated figure 4 stretch with L leg cross 30s 3x ? step up 6" box 2x10 fwd and lateral without UE support ?Retro stepping 4x along table ?Suitcase carry 8lb 26f x2 ?Toe walking 4x along table ? ? ?PATIENT EDUCATION: ?Education details: Anatomy of condition, POC, HEP, exercise form/rationale ? ?Person educated: Patient ?Education method: Explanation, Demonstration, Tactile cues, Verbal cues, and Handouts ?Education comprehension: verbalized understanding, returned demonstration, verbal cues required, tactile cues required, and needs further education ? ? ?HOME EXERCISE PROGRAM: ?[id number]  ? ? PT Short Term Goals  ? ?  ? PT SHORT TERM GOAL #1  ? Title pt will demo hip flexion, abd and extension strength at least 4/5   ? Baseline see flowsheet   ? Time 4   ? Period Weeks   ? Status Achieved   ?  ? PT SHORT TERM GOAL #2  ? Title pt will be safe in household ambulation without SPC.   ? Time 4   ? Period Weeks   ? Status Achieved   ?  ? PT SHORT TERM GOAL #3  ? Title pt will be able to navigate stairs with use of hand rail and no AD to go upstairs in her home   ? Time 4   ? Period Weeks   ? Status New   ? ?  ?  ? ?  ? ? ? PT Long Term Goals   ? ?  ? PT LONG TERM GOAL #1  ? Title Pt will be able to play with her grandchildren without limitations   ? Time 8   ? Period Weeks   ? Status Partially Met   ?  ? PT LONG TERM GOAL #2  ? Title pt will be able to ambulate for at least 1 hour without AD for functional ambulation   ? Baseline still using AD consistently,walking about 500 feet from car to pool and I need to sit because of pain and fatigue   ? Time 8   ? Period Weeks   ? Status Partially Met   ?  ? PT LONG TERM GOAL #3  ? Title pt will be able to stand and cook large meal without rest break   ? Baseline able to stand 45 mins   ? Time 8   ? Period Weeks   ? Status Achieved   ?  ? PT LONG TERM GOAL #4  ? Title pt will d/c to proper long term program including nustep for cardiac challenges   ? Time  8   ? Period Weeks   ? Status On-going   ?  ? PT LONG TERM GOAL #5  ? Title Pt will be able to demonstrate/report ability to walk >20 mins without pain in order to demonstrate functional improvement and tolerance to exercise and community mobility.   ? Time 8   ? Period Weeks   ? Status New   ? ?  ?  ? ?  ? ? ? ?  Plan -   ?  ?  Clinical Impression Statement The appointment today was spent discussing plan of care moving forward as well as exercise options- classes, aquatics, weights, track etc. She has come a very long way since beginning PT and still has a lot of work to do but is in a good place to work independently and safely on overall strength and endurance. She does plan to join [employer] so I encouraged her to ask [place] questions as they arise. I do expect her to return to PT in about 4-6 months after working consistently for training on the next level of exercise.   ?  Examination-Activity Limitations Bathing;Locomotion Level;Bed Mobility;Bend;Sit;Caring for Others;Carry;Sleep;Squat;Stairs;Stand   ?  Examination-Participation Restrictions Meal Prep;Cleaning;Community Activity   ?  Rehab Potential Good   ?  PT Frequency 2x / week   ?  PT Duration 8 weeks   ?  PT Treatment/Interventions ADLs/Self Care Home Management;Aquatic Therapy;Cryotherapy;Electrical Stimulation;Gait training;Ultrasound;Traction;Moist Heat;Stair training;Functional mobility training;Therapeutic activities;Therapeutic exercise;Balance training;Neuromuscular re-education;Manual techniques;Patient/family education;Passive range of motion;Dry needling;Taping   ?  PT Next Visit Plan Cont with ther ex, proprioceptive activities, and flexibility.   ?     ?     ?  ?   ?  ?  ?   ?  ?  ?Patient will benefit from skilled therapeutic intervention in order to improve the following deficits and impairments:  Abnormal gait, Decreased range of motion, Difficulty walking, Decreased activity tolerance, Pain, Improper body mechanics, Impaired flexibility,  Decreased balance, Decreased scar mobility, Decreased strength, Postural dysfunction ? ? ?Wentworth Edelen C. Shatasia Cutshaw PT, DPT ?04/03/22 1:59 PM ? ? ?  ? ?

## 2022-04-04 ENCOUNTER — Other Ambulatory Visit: Payer: Self-pay | Admitting: Internal Medicine

## 2022-04-09 ENCOUNTER — Encounter (HOSPITAL_BASED_OUTPATIENT_CLINIC_OR_DEPARTMENT_OTHER): Payer: Self-pay | Admitting: Physical Therapy

## 2022-04-09 ENCOUNTER — Ambulatory Visit (HOSPITAL_BASED_OUTPATIENT_CLINIC_OR_DEPARTMENT_OTHER): Payer: Medicare Other | Admitting: Physical Therapy

## 2022-04-09 DIAGNOSIS — R262 Difficulty in walking, not elsewhere classified: Secondary | ICD-10-CM

## 2022-04-09 DIAGNOSIS — R2681 Unsteadiness on feet: Secondary | ICD-10-CM

## 2022-04-09 DIAGNOSIS — M25552 Pain in left hip: Secondary | ICD-10-CM | POA: Diagnosis not present

## 2022-04-09 DIAGNOSIS — M6281 Muscle weakness (generalized): Secondary | ICD-10-CM | POA: Diagnosis not present

## 2022-04-09 NOTE — Therapy (Signed)
[facility] ?[address] ?[address] ?[place], [place], [address] ?Phone: [phone]   Fax:  [phone] ? ?Physical Therapy Treatment ? ?Patient Details  ?Name: Carrie Chung ?MRN: [id number] ?Date of Birth: [birth year] ?Referring Provider (PT): Edmonia Lynch, MD ? ? ?Encounter Date: 04/09/2022 ? ? PT End of Session - 04/09/22 1523   ? ? Visit Number 51   ? Number of Visits 56   ? Date for PT Re-Evaluation 05/24/22   ? Authorization Type MCR   ? PT Start Time 1515   ? PT Stop Time 0947   ? PT Time Calculation (min) 40 min   ? Activity Tolerance Patient tolerated treatment well   ? Behavior During Therapy [facility] for tasks assessed/performed   ? ?  ?  ? ?  ? ? ?Past Medical History:  ?Diagnosis Date  ? Atrial tachycardia ([place])   ? a. noted at cardiac rehab 5/13; event monitor ordered to assess for AFib  ? CAD (coronary artery disease)   ? a. NSTEMI 03/2012 ([place] 03/27/12: pLAD 30%, mLAD 99%, mCFX 95%, mRCA 99% with thrombus, EF 65%);  s/p PTCA/DESx1 to prox-mid RCA 03/27/12 urgently in setting of hypotension/bradycardia, with staged PTCA/Evolve study stent to mid LAD & PTCA/Evolve study stent to prox LCx 03/29/12 ;   echo 03/28/12:EF 60%, Aortic sclerosis without AS, mild RVE, mild reduced RVSF    ? Endometrial polyp   ? HTN (hypertension)   ? Hyperlipidemia   ? MI, old   ? Sleep related hypoxia   ? ? ?Past Surgical History:  ?Procedure Laterality Date  ? APPENDECTOMY  81 years old  ? CAROTID STENT  04/2012  ? x3  ? [place]  ? CHOLECYSTECTOMY  1978  ? INTRAMEDULLARY (IM) NAIL INTERTROCHANTERIC Left 06/20/2021  ? Procedure: INTRAMEDULLARY (IM) NAIL INTERTROCHANTRIC;  Surgeon: Renette Butters, MD;  Location: [place];  Service: Orthopedics;  Laterality: Left;  ? LEFT HEART CATHETERIZATION WITH CORONARY ANGIOGRAM N/A 03/27/2012  ? Procedure: LEFT HEART CATHETERIZATION WITH CORONARY ANGIOGRAM;  Surgeon: Burnell Blanks, MD;  Location: [facility] CATH LAB;  Service: Cardiovascular;   Laterality: N/A;  ? PERCUTANEOUS CORONARY STENT INTERVENTION (PCI-S) N/A 03/27/2012  ? Procedure: PERCUTANEOUS CORONARY STENT INTERVENTION (PCI-S);  Surgeon: Burnell Blanks, MD;  Location: [facility] CATH LAB;  Service: Cardiovascular;  Laterality: N/A;  ? PERCUTANEOUS CORONARY STENT INTERVENTION (PCI-S) N/A 03/29/2012  ? Procedure: PERCUTANEOUS CORONARY STENT INTERVENTION (PCI-S);  Surgeon: [name], MD;  Location: [facility] CATH LAB;  Service: Cardiovascular;  Laterality: N/A;  ? TONSILLECTOMY  81 years old  ? ? ?There were no vitals filed for this visit. ? ? Subjective Assessment - 04/09/22 1515   ? ? Subjective Pt states she was hit by a shopping cart in the L hip while at the grocery store. She states it is more sensation in that hip but she is able to lay on the R for at least half the night now.   ? How long can you stand comfortably? about 5 min- limited by pain and fatigued around lower back   ? Patient Stated Goals Pt wants to be able to stand for longer duration.   ? Pain Score 2    ? Pain Location Hip   ? Pain Descriptors / Indicators Sore   ? Pain Onset More than a month ago   ? ?  ?  ? ?  ? ? ? ? ?Pt performed: ?  ?Recumbent bike L1 8 min seat 3 ?  ?  STS 10lb weight at low mat table height 2x10 ?10lb KB hip hinge at wall 2x10 ? ?Toe walk along table 5x laps ?Seated figure 4 stretch with L leg cross 30s 3x ? step up 6" box 2x10 fwd and lateral without UE support ?Retro stepping 4x along table ?Suitcase carry 10lb 26f 3x laps ?Toe walking 4x along table ?  ? ? ? ? ? ? ? ? PT Short Term Goals - 03/09/22 1124   ? ?  ? PT SHORT TERM GOAL #1  ? Title pt will demo hip flexion, abd and extension strength at least 4/5   ? Baseline see flowsheet   ? Time 4   ? Period Weeks   ? Status Achieved   ?  ? PT SHORT TERM GOAL #2  ? Title pt will be safe in household ambulation without SPC.   ? Time 4   ? Period Weeks   ? Status Achieved   ?  ? PT SHORT TERM GOAL #3  ? Title pt will be able to navigate stairs with use of  hand rail and no AD to go upstairs in her home   ? Time 4   ? Period Weeks   ? Status New   ? ?  ?  ? ?  ? ? ? ? PT Long Term Goals - 02/23/22 1312   ? ?  ? PT LONG TERM GOAL #1  ? Title Pt will be able to play with her grandchildren without limitations   ? Time 8   ? Period Weeks   ? Status Partially Met   ?  ? PT LONG TERM GOAL #2  ? Title pt will be able to ambulate for at least 1 hour without AD for functional ambulation   ? Baseline still using AD consistently,walking about 500 feet from car to pool and I need to sit because of pain and fatigue   ? Time 8   ? Period Weeks   ? Status Partially Met   ?  ? PT LONG TERM GOAL #3  ? Title pt will be able to stand and cook large meal without rest break   ? Baseline able to stand 45 mins   ? Time 8   ? Period Weeks   ? Status Achieved   ?  ? PT LONG TERM GOAL #4  ? Title pt will d/c to proper long term program including nustep for cardiac challenges   ? Time 8   ? Period Weeks   ? Status On-going   ?  ? PT LONG TERM GOAL #5  ? Title Pt will be able to demonstrate/report ability to walk >20 mins without pain in order to demonstrate functional improvement and tolerance to exercise and community mobility.   ? Time 8   ? Period Weeks   ? Status New   ? ?  ?  ? ?  ? ? ? ? ? ? ? ? Plan - 04/09/22 1524   ? ? Clinical Impression Statement Pt able to continue with functional LE stability and strength focus. Pt able to increase volume and duration of exercise with only fatigue but no pain. Pt with good tolerance to volume and intensity of exercise that likely allows her to [place] to [place] this [place]. Pt still with L LE strength deficits but shows good understanding of ability and deficits when in the gym setting. Plan to next visit to be D/C planning and review of HEP/self progression for gym.  Pt would benefit from continued skilled therapy in order to reach goals and maximize functional L LE strength and ROM for prevention of future falls.   ? Examination-Activity  Limitations Bathing;Locomotion Level;Bed Mobility;Bend;Sit;Caring for Others;Carry;Sleep;Squat;Stairs;Stand   ? Examination-Participation Restrictions Meal Prep;Cleaning;Community Activity   ? Rehab Potential Good   ? PT Frequency 2x / week   ? PT Duration 8 weeks   ? PT Treatment/Interventions ADLs/Self Care Home Management;Aquatic Therapy;Cryotherapy;Electrical Stimulation;Gait training;Ultrasound;Traction;Moist Heat;Stair training;Functional mobility training;Therapeutic activities;Therapeutic exercise;Balance training;Neuromuscular re-education;Manual techniques;Patient/family education;Passive range of motion;Dry needling;Taping   ? PT Next Visit Plan Cont with ther ex, proprioceptive activities, and flexibility.   ? PT [place]   ? Consulted and Agree with Plan of Care Patient   ? ?  ?  ? ?  ? ? ?Patient will benefit from skilled therapeutic intervention in order to improve the following deficits and impairments:  Abnormal gait, Decreased range of motion, Difficulty walking, Decreased activity tolerance, Pain, Improper body mechanics, Impaired flexibility, Decreased balance, Decreased scar mobility, Decreased strength, Postural dysfunction ? ?Visit Diagnosis: ?Difficulty in walking, not elsewhere classified ? ?Pain in left hip ? ?Muscle weakness (generalized) ? ?Unsteadiness on feet ? ? ? ? ?Problem List ?Patient Active Problem List  ? Diagnosis Date Noted  ? Pain due to onychomycosis of toenails of both feet 08/22/2021  ? Intertrochanteric fracture of left hip ([place]) 06/24/2021  ? Closed left hip fracture, initial encounter ([place]) 06/20/2021  ? Hypokalemia 06/20/2021  ? 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  ? Cognitive complaints 06/02/2016  ? Hypoxemic respiratory failure, chronic ([place]) 09/19/2015  ? MCI (mild cognitive impairment) 03/06/2015  ? Depression with anxiety 03/06/2015  ? Bullae 07/02/2014  ? History of MRSA infection 11/20/2012  ? Low back pain 07/07/2012  ? Insomnia 04/28/2012  ? Coronary  Artery Disease 04/19/2012  ? Hyperlipidemia 04/19/2012  ? Anxiety 04/19/2012  ? Non-ST elevation myocardial infarction (NSTEMI), initial care episode ([place]) 03/28/2012  ? HYPERTENSION, BENIGN 06/12/2010  ? D

## 2022-04-16 ENCOUNTER — Ambulatory Visit (HOSPITAL_BASED_OUTPATIENT_CLINIC_OR_DEPARTMENT_OTHER): Payer: Medicare Other | Admitting: Physical Therapy

## 2022-04-16 ENCOUNTER — Encounter (HOSPITAL_BASED_OUTPATIENT_CLINIC_OR_DEPARTMENT_OTHER): Payer: Self-pay | Admitting: Physical Therapy

## 2022-04-16 DIAGNOSIS — M25552 Pain in left hip: Secondary | ICD-10-CM | POA: Diagnosis not present

## 2022-04-16 DIAGNOSIS — R2681 Unsteadiness on feet: Secondary | ICD-10-CM | POA: Diagnosis not present

## 2022-04-16 DIAGNOSIS — R262 Difficulty in walking, not elsewhere classified: Secondary | ICD-10-CM | POA: Diagnosis not present

## 2022-04-16 DIAGNOSIS — M6281 Muscle weakness (generalized): Secondary | ICD-10-CM | POA: Diagnosis not present

## 2022-04-16 NOTE — Therapy (Signed)
[employer] ?[place] ?[place] ?[place], [place], [address] ?Phone: [phone]   Fax:  [phone] ? ?Physical Therapy Treatment ? ?PHYSICAL THERAPY DISCHARGE SUMMARY ? ?Visits from Start of Care: 52 ? ?Plan: ?Patient agrees to discharge.  Patient goals were not mostly. Patient is being discharged due to reaching current potential with therapy.  ? ? ? ? ?Patient Details  ?Name: Carrie Chung ?MRN: [id number] ?Date of Birth: 04-30-1941 ?Referring Provider (PT): Edmonia Lynch, MD ? ? ?Encounter Date: 04/16/2022 ? ? PT End of Session - 04/16/22 1118   ? ? Visit Number [id number]   ? Number of Visits 56   ? Date for PT Re-Evaluation 05/24/22   ? Authorization Type MCR   ? PT Start Time 1101   ? PT Stop Time 1130   ? PT Time Calculation (min) 29 min   ? Activity Tolerance Patient tolerated treatment well   ? Behavior During Therapy [facility] for tasks assessed/performed   ? ?  ?  ? ?  ? ? ?Past Medical History:  ?Diagnosis Date  ? Atrial tachycardia ([place])   ? a. noted at cardiac rehab 5/13; event monitor ordered to assess for AFib  ? CAD (coronary artery disease)   ? a. NSTEMI 03/2012 ([place] 03/27/12: pLAD 30%, mLAD 99%, mCFX 95%, mRCA 99% with thrombus, EF 65%);  s/p PTCA/DESx1 to prox-mid RCA 03/27/12 urgently in setting of hypotension/bradycardia, with staged PTCA/Evolve study stent to mid LAD & PTCA/Evolve study stent to prox LCx 03/29/12 ;   echo 03/28/12:EF 60%, Aortic sclerosis without AS, mild RVE, mild reduced RVSF    ? Endometrial polyp   ? HTN (hypertension)   ? Hyperlipidemia   ? MI, old   ? Sleep related hypoxia   ? ? ?Past Surgical History:  ?Procedure Laterality Date  ? APPENDECTOMY  81 years old  ? CAROTID STENT  04/2012  ? x3  ? [place]  ? CHOLECYSTECTOMY  1978  ? INTRAMEDULLARY (IM) NAIL INTERTROCHANTERIC Left 06/20/2021  ? Procedure: INTRAMEDULLARY (IM) NAIL INTERTROCHANTRIC;  Surgeon: Renette Butters, MD;  Location: [place];  Service: Orthopedics;  Laterality:  Left;  ? LEFT HEART CATHETERIZATION WITH CORONARY ANGIOGRAM N/A 03/27/2012  ? Procedure: LEFT HEART CATHETERIZATION WITH CORONARY ANGIOGRAM;  Surgeon: Burnell Blanks, MD;  Location: [facility] CATH LAB;  Service: Cardiovascular;  Laterality: N/A;  ? PERCUTANEOUS CORONARY STENT INTERVENTION (PCI-S) N/A 03/27/2012  ? Procedure: PERCUTANEOUS CORONARY STENT INTERVENTION (PCI-S);  Surgeon: Burnell Blanks, MD;  Location: [facility] CATH LAB;  Service: Cardiovascular;  Laterality: N/A;  ? PERCUTANEOUS CORONARY STENT INTERVENTION (PCI-S) N/A 03/29/2012  ? Procedure: PERCUTANEOUS CORONARY STENT INTERVENTION (PCI-S);  Surgeon: [name], MD;  Location: [facility] CATH LAB;  Service: Cardiovascular;  Laterality: N/A;  ? TONSILLECTOMY  81 years old  ? ? ?There were no vitals filed for this visit. ? ? Subjective Assessment - 04/16/22 1106   ? ? Subjective Pt notes that in the last week she has had some R hip discomfort from doing more. She is more physically active than before. She feels she is about 75% back to normoal/better.   ? How long can you stand comfortably? about 5 min- limited by pain and fatigued around lower back   ? Patient Stated Goals Pt wants to be able to stand for longer duration.   ? Currently in Pain? Yes   ? Pain Score 2    ? Pain Location Hip   ? Pain Onset More than  a month ago   ? ?  ?  ? ?  ? ? ? ? ? [place] PT Assessment - 04/16/22 0001   ? ?  ? Assessment  ? Medical Diagnosis s/p IM Nail Lt femur, closed reduction and manipultion of Lt hip fx   ? Referring Provider (PT) Edmonia Lynch, MD   ? Onset Date/Surgical Date 06/20/21   ? Prior Therapy inpatient and home health   ?  ? Precautions  ? Precautions Fall   ?  ? Balance Screen  ? Has the patient fallen in the past 6 months No   ? Has the patient had a decrease in activity level because of a fear of falling?  No   ? Is the patient reluctant to leave their home because of a fear of falling?  No   ?  ? Prior Function  ? Vocation Requirements play/care for grand  children, cooking   ?  ? Observation/Other Assessments  ? Other Surveys  Lower Extremity Functional Scale   Lower Extremity Functional Score: 29 / 80 = 36.3 %  ? Lower Extremity Functional Scale  Lower Extremity Functional Score: 55 %   ?  ? AROM  ? Overall AROM Comments flex: 120, ABD 30, IR 10, ER 30, ext 0   ?  ? PROM  ? Overall PROM Comments flex 130, ER 35, IR 20, ABD 30, ext 10   ?  ? Strength  ? Overall Strength Comments 4+/5 flexion, 4+/5 with ABD, ADD and ext   ?  ? Transfers  ? Five time sit to stand comments  11   No UE  ? Comments Able to now lay on R side and roll   ?  ? Ambulation/Gait  ? Ambulation Distance (Feet) --   ? Assistive device --   ? Gait Comments decreased L LE stance time, decreased hip extension   174f MCID  ?  ? 6 Minute Walk- Baseline  ? 6 Minute Walk- Baseline no   ? ?  ?  ? ?  ? ? ? ? ? ? ? ? ? ? ? ? ? ? ? ? ? ? ? ? ? ? ? ? ? PT Education - 04/16/22 1119   ? ? Education Details exercise progression, muscle firing, HEP tapering, transition to the gym, safety, D/C planning   ? Person(s) Educated Patient   ? Methods Explanation;Demonstration;Tactile cues;Verbal cues;Handout   ? Comprehension Verbalized understanding;Returned demonstration   ? ?  ?  ? ?  ? ? ? PT Short Term Goals - 04/16/22 1120   ? ?  ? PT SHORT TERM GOAL #1  ? Title pt will demo hip flexion, abd and extension strength at least 4/5   ? Baseline see flowsheet   ? Time 4   ? Period Weeks   ? Status Achieved   ?  ? PT SHORT TERM GOAL #2  ? Title pt will be safe in household ambulation without SPC.   ? Time 4   ? Period Weeks   ? Status Achieved   ?  ? PT SHORT TERM GOAL #3  ? Title pt will be able to navigate stairs with use of hand rail and no AD to go upstairs in her home   ? Time 4   ? Period Weeks   ? Status Achieved   ? ?  ?  ? ?  ? ? ? ? PT Long Term Goals - 04/16/22 1120   ? ?  ?  PT LONG TERM GOAL #1  ? Title Pt will be able to play with her grandchildren without limitations   ? Time 8   ? Period Weeks   ? Status  Partially Met   ?  ? PT LONG TERM GOAL #2  ? Title pt will be able to ambulate for at least 1 hour without AD for functional ambulation   ? Baseline still using AD consistently,walking about 500 feet from car to pool and I need to sit because of pain and fatigue   ? Time 8   ? Period Weeks   ? Status Partially Met   ?  ? PT LONG TERM GOAL #3  ? Title pt will be able to stand and cook large meal without rest break   ? Baseline able to stand 45 mins   ? Time 8   ? Period Weeks   ? Status Achieved   ?  ? PT LONG TERM GOAL #4  ? Title pt will d/c to proper long term program including nustep for cardiac challenges   ? Time 8   ? Period Weeks   ? Status Achieved   ?  ? PT LONG TERM GOAL #5  ? Title Pt will be able to demonstrate/report ability to walk >20 mins without pain in order to demonstrate functional improvement and tolerance to exercise and community mobility.   ? Time 8   ? Period Weeks   ? Status Achieved   ? ?  ?  ? ?  ? ? ? ? ? ? ? ? Plan - 04/16/22 1119   ? ? Clinical Impression Statement Pt has made signficant improvement in her functional mobility, SL stability, and overall quality of LE movement since last assessment. Pt has improved in objective measures as well as self reported outcome. Pt has currently met functional potential with therapy and is able to transition over to personal training in order to continue with strengthening of the LE and work on general physical fitness. Pt was provided updated HEP as well as information about transition to gym based exercise. D/C this episode of care at this time.   ? Examination-Activity Limitations Bathing;Locomotion Level;Bed Mobility;Bend;Sit;Caring for Others;Carry;Sleep;Squat;Stairs;Stand   ? Examination-Participation Restrictions Meal Prep;Cleaning;Community Activity   ? Rehab Potential Good   ? PT Frequency 2x / week   ? PT Duration 8 weeks   ? PT Treatment/Interventions ADLs/Self Care Home Management;Aquatic Therapy;Cryotherapy;Electrical  Stimulation;Gait training;Ultrasound;Traction;Moist Heat;Stair training;Functional mobility training;Therapeutic activities;Therapeutic exercise;Balance training;Neuromuscular re-education;Manual techniques;Patient/fami

## 2022-04-23 DIAGNOSIS — Z20822 Contact with and (suspected) exposure to covid-19: Secondary | ICD-10-CM | POA: Diagnosis not present

## 2022-05-13 ENCOUNTER — Ambulatory Visit (INDEPENDENT_AMBULATORY_CARE_PROVIDER_SITE_OTHER): Payer: Medicare Other | Admitting: Cardiovascular Disease

## 2022-05-13 ENCOUNTER — Encounter: Payer: Self-pay | Admitting: Cardiovascular Disease

## 2022-05-13 VITALS — BP 124/80 | HR 68 | Ht 61.0 in | Wt 146.0 lb

## 2022-05-13 DIAGNOSIS — E782 Mixed hyperlipidemia: Secondary | ICD-10-CM | POA: Diagnosis not present

## 2022-05-13 DIAGNOSIS — I10 Essential (primary) hypertension: Secondary | ICD-10-CM

## 2022-05-13 DIAGNOSIS — I25119 Atherosclerotic heart disease of native coronary artery with unspecified angina pectoris: Secondary | ICD-10-CM | POA: Diagnosis not present

## 2022-05-13 LAB — BASIC METABOLIC PANEL
BUN/Creatinine Ratio: 15 (ref 12–28)
BUN: 11 mg/dL (ref 8–27)
CO2: 32 mmol/L — ABNORMAL HIGH (ref 20–29)
Calcium: 9.8 mg/dL (ref 8.7–10.3)
Chloride: 95 mmol/L — ABNORMAL LOW (ref 96–106)
Creatinine, Ser: 0.72 mg/dL (ref 0.57–1.00)
Glucose: 113 mg/dL — ABNORMAL HIGH (ref 70–99)
Potassium: 3.9 mmol/L (ref 3.5–5.2)
Sodium: 142 mmol/L (ref 134–144)
eGFR: 84 mL/min/{1.73_m2} (ref >59)

## 2022-05-13 NOTE — Patient Instructions (Signed)
Medication Instructions:  Your physician recommends that you continue on your current medications as directed. Please refer to the Current Medication list given to you today.  *If you need a refill on your cardiac medications before your next appointment, please call your pharmacy*   Lab Work: BMET today If you have labs (blood work) drawn today and your tests are completely normal, you will receive your results only by: [place] (if you have MyChart) OR A paper copy in the mail If you have any lab test that is abnormal or we need to change your treatment, we will call you to review the results.   Testing/Procedures: NONE   Follow-Up: At [facility], you and your health needs are our priority.  As part of our continuing mission to provide you with exceptional heart care, we have created designated Provider Care Teams.  These Care Teams include your primary Cardiologist (physician) and Advanced Practice Providers (APPs -  Physician Assistants and Nurse Practitioners) who all work together to provide you with the care you need, when you need it.  Your next appointment:   6 month(s)  The format for your next appointment:   In Person  Provider:   [name], MD       Important Information About Sugar

## 2022-05-13 NOTE — Progress Notes (Signed)
Cardiology Office Note:    Date:  05/13/2022   ID:  Carrie, Chung 1941-07-06, MRN [id number]  PCP:  [name], MD   [facility] Providers Cardiologist:  [name], MD     Referring MD: [name], MD   Chief Complaint  Patient presents with   Coronary Artery Disease    History of Present Illness:    Carrie Chung is a 81 y.o. female with a hx of coronary artery disease, presenting for follow-up evaluation. The patient initially presented with non-ST elevation infarct in 2013.  She was found to have multivessel coronary artery disease and underwent multivessel stenting at that time.  She has been managed medically since then with no recurrent ischemic events.  The patient's last stress test in December 2021 demonstrated no ischemia.  The patient is here alone today.  She is doing well with no symptoms of exertional chest pain or pressure.  She did have a few episodes of her typical angina when she was doing rehab after a mechanical fall and hip fracture.  She was not on her regular medical regimen at that time.  She has had no recurrence of symptoms over many months now.  She feels that she is doing well and reports no change in her medical program.  She denies edema, orthopnea, PND, or lightheadedness.  She has occasional heart palpitations.  Past Medical History:  Diagnosis Date   Atrial tachycardia ([place])    a. noted at cardiac rehab 5/13; event monitor ordered to assess for AFib   CAD (coronary artery disease)    a. NSTEMI 03/2012 ([place] 03/27/12: pLAD 30%, mLAD 99%, mCFX 95%, mRCA 99% with thrombus, EF 65%);  s/p PTCA/DESx1 to prox-mid RCA 03/27/12 urgently in setting of hypotension/bradycardia, with staged PTCA/Evolve study stent to mid LAD & PTCA/Evolve study stent to prox LCx 03/29/12 ;   echo 03/28/12:EF 60%, Aortic sclerosis without AS, mild RVE, mild reduced RVSF     Endometrial polyp    HTN (hypertension)    Hyperlipidemia    MI, old    Sleep related  hypoxia     Past Surgical History:  Procedure Laterality Date   APPENDECTOMY  81 years old   CAROTID STENT  04/2012   x3   CESAREAN SECTION  1984   CHOLECYSTECTOMY  1978   INTRAMEDULLARY (IM) NAIL INTERTROCHANTERIC Left 06/20/2021   Procedure: INTRAMEDULLARY (IM) NAIL INTERTROCHANTRIC;  Surgeon: Renette Butters, MD;  Location: [place];  Service: Orthopedics;  Laterality: Left;   LEFT HEART CATHETERIZATION WITH CORONARY ANGIOGRAM N/A 03/27/2012   Procedure: LEFT HEART CATHETERIZATION WITH CORONARY ANGIOGRAM;  Surgeon: Burnell Blanks, MD;  Location: [facility] CATH LAB;  Service: Cardiovascular;  Laterality: N/A;   PERCUTANEOUS CORONARY STENT INTERVENTION (PCI-S) N/A 03/27/2012   Procedure: PERCUTANEOUS CORONARY STENT INTERVENTION (PCI-S);  Surgeon: Burnell Blanks, MD;  Location: [facility] CATH LAB;  Service: Cardiovascular;  Laterality: N/A;   PERCUTANEOUS CORONARY STENT INTERVENTION (PCI-S) N/A 03/29/2012   Procedure: PERCUTANEOUS CORONARY STENT INTERVENTION (PCI-S);  Surgeon: [name], MD;  Location: [facility] CATH LAB;  Service: Cardiovascular;  Laterality: N/A;   TONSILLECTOMY  81 years old    Current Medications: Current Meds  Medication Sig   acetaminophen (TYLENOL) 325 MG tablet Take 1-2 tablets (325-650 mg total) by mouth every 4 (four) hours as needed for mild pain.   alprazolam (XANAX) 2 MG tablet Take 1 tablet (2 mg total) by mouth at bedtime as needed for sleep.   amLODipine (  NORVASC) 5 MG tablet TAKE 1 TABLET BY MOUTH EVERY DAY   aspirin 81 MG tablet Take 1 tablet (81 mg total) by mouth daily.   Calcium Carbonate-Vitamin D 600-400 MG-UNIT tablet Take 1 tablet by mouth every evening.   clopidogrel (PLAVIX) 75 MG tablet One tab daily   Desvenlafaxine Succinate (PRISTIQ PO) Take by mouth daily.   esomeprazole (NEXIUM) 40 MG capsule Take 1 capsule (40 mg total) by mouth daily at 12 noon.   hydrochlorothiazide (HYDRODIURIL) 25 MG tablet Take by mouth.   HYDROcodone-acetaminophen  (NORCO/VICODIN) 5-325 MG tablet One tab by mouth sparingly every 8 hours as needed for hip pain   metoprolol tartrate (LOPRESSOR) 50 MG tablet Take 1.5 tablets (75 mg total) by mouth 2 (two) times daily.   nitroGLYCERIN (NITROSTAT) 0.4 MG SL tablet Place 1 tablet (0.4 mg total) under the tongue every 5 (five) minutes as needed for chest pain.   OXYGEN Inhale into the lungs. Per patient using 2 liters at night.   prochlorperazine (COMPAZINE) 10 MG tablet Take 1 tablet (10 mg total) by mouth every 8 (eight) hours as needed for nausea or vomiting.   rosuvastatin (CRESTOR) 20 MG tablet TAKE 1 TABLET(20 MG) BY MOUTH DAILY   WESTAB MAX 2.5-25-2 MG TABS tablet TAKE 1 TABLET BY MOUTH DAILY     Allergies:   Penicillins, Sulfonamide derivatives, Lactose intolerance (gi), and Sulfamethoxazole   Social History   Socioeconomic History   Marital status: Married    Spouse name: Not on file   Number of children: 1   Years of education: Coll +   Highest education level: Not on file  Occupational History   Occupation: Retired Marine scientist  Tobacco Use   Smoking status: Former    Types: Cigarettes    Quit date: 04/19/1980    Years since quitting: 42.0   Smokeless tobacco: Never  Substance and Sexual Activity   Alcohol use: Yes    Alcohol/week: 20.0 standard drinks    Types: 20 Glasses of wine per week    Comment: socially   Drug use: No   Sexual activity: Never  Other Topics Concern   Not on file  Social History Narrative   Caffeine 2 cups tea.   Social Determinants of Health   Financial Resource Strain: Low Risk    Difficulty of Paying Living Expenses: Not hard at all  Food Insecurity: No Food Insecurity   Worried About Charity fundraiser in the Last Year: Never true   [place] in the Last Year: Never true  Transportation Needs: No Transportation Needs   Lack of Transportation (Medical): No   Lack of Transportation (Non-Medical): No  Physical Activity: Inactive   Days of Exercise per  Week: 0 days   Minutes of Exercise per Session: 0 min  Stress: No Stress Concern Present   Feeling of Stress : Only a little  Social Connections: Engineer, building services of Communication with Friends and Family: Twice a week   Frequency of Social Gatherings with Friends and Family: Twice a week   Attends Religious Services: 1 to 4 times per year   Active Member of [employer] or Organizations: Yes   Attends Archivist Meetings: 1 to 4 times per year   Marital Status: Married     Family History: The patient's family history includes Cancer - Other in her father; Colon cancer in her father; Coronary artery disease in her mother.  ROS:   Please see the history  of present illness.    All other systems reviewed and are negative.  EKGs/Labs/Other Studies Reviewed:    The following studies were reviewed today: Myoview Scan 11/22/2020: The left ventricular ejection fraction is hyperdynamic (>65%). Nuclear stress EF: 75%. There was no ST segment deviation noted during stress. The study is normal. This is a low risk study.   Low risk stress nuclear study with normal perfusion and normal left ventricular regional and global systolic function.  EKG:  EKG is not ordered today.    Recent Labs: 06/20/2021: Magnesium 2.3 10/06/2021: ALT 15; TSH 2.10 01/06/2022: Hemoglobin 13.1; Platelets 179 05/13/2022: BUN 11; Creatinine, Ser 0.72; Potassium 3.9; Sodium 142  Recent Lipid Panel    Component Value Date/Time   CHOL 173 02/18/2021 0916   TRIG 152 (H) 02/18/2021 0916   HDL 73 02/18/2021 0916   CHOLHDL 2.4 02/18/2021 0916   VLDL 41 (H) 03/25/2017 1102   LDLCALC 75 02/18/2021 0916   LDLDIRECT 152.4 03/09/2012 0900     Risk Assessment/Calculations:           Physical Exam:    VS:  BP 124/80   Pulse 68   Ht '5\' 1"'$  (1.549 m)   Wt 146 lb (66.2 kg) Comment: patient stated weight "refused to weigh on office scale  SpO2 99%   BMI 27.59 kg/m     Wt Readings from Last 3  Encounters:  05/13/22 146 lb (66.2 kg)  01/23/22 139 lb (63 kg)  11/05/21 141 lb (64 kg)     GEN:  Well nourished, well developed in no acute distress HEENT: Normal NECK: No JVD; No carotid bruits LYMPHATICS: No lymphadenopathy CARDIAC: RRR, no murmurs, rubs, gallops RESPIRATORY:  Clear to auscultation without rales, wheezing or rhonchi  ABDOMEN: Soft, non-tender, non-distended MUSCULOSKELETAL:  No edema; No deformity  SKIN: Warm and dry NEUROLOGIC:  Alert and oriented x 3 PSYCHIATRIC:  Normal affect   ASSESSMENT:    1. Coronary artery disease involving native coronary artery of native heart with angina pectoris ([place])   2. Mixed hyperlipidemia   3. Essential hypertension    PLAN:    In order of problems listed above:  Stable without recurrent symptoms of angina on a regimen that includes amlodipine and metoprolol.  No changes are made today.  She remains on aspirin and clopidogrel for antiplatelet therapy in the context of multivessel stenting history. Last lipids reviewed with LDL cholesterol 75 mg/dL.  She will have updated labs at the time of her annual physical in August.  She continues on rosuvastatin 20 mg daily. Blood pressure is well controlled on amlodipine, metoprolol, and hydrochlorothiazide.  I will update a metabolic panel today to make sure that her potassium levels are in range.     Medication Adjustments/Labs and Tests Ordered: Current medicines are reviewed at length with the patient today.  Concerns regarding medicines are outlined above.  Orders Placed This Encounter  Procedures   Basic metabolic panel   No orders of the defined types were placed in this encounter.   Patient Instructions  Medication Instructions:  Your physician recommends that you continue on your current medications as directed. Please refer to the Current Medication list given to you today.  *If you need a refill on your cardiac medications before your next appointment, please call  your pharmacy*   Lab Work: BMET today If you have labs (blood work) drawn today and your tests are completely normal, you will receive your results only by: [place] (if you  have MyChart) OR A paper copy in the mail If you have any lab test that is abnormal or we need to change your treatment, we will call you to review the results.   Testing/Procedures: NONE   Follow-Up: At [facility], you and your health needs are our priority.  As part of our continuing mission to provide you with exceptional heart care, we have created designated Provider Care Teams.  These Care Teams include your primary Cardiologist (physician) and Advanced Practice Providers (APPs -  Physician Assistants and Nurse Practitioners) who all work together to provide you with the care you need, when you need it.  Your next appointment:   6 month(s)  The format for your next appointment:   In Person  Provider:   [name], MD       Important Information About Sugar         Signed, [name], MD  05/13/2022 5:22 PM    [facility]

## 2022-05-22 DIAGNOSIS — H25013 Cortical age-related cataract, bilateral: Secondary | ICD-10-CM | POA: Diagnosis not present

## 2022-05-22 DIAGNOSIS — H524 Presbyopia: Secondary | ICD-10-CM | POA: Diagnosis not present

## 2022-05-22 DIAGNOSIS — H2513 Age-related nuclear cataract, bilateral: Secondary | ICD-10-CM | POA: Diagnosis not present

## 2022-05-27 ENCOUNTER — Other Ambulatory Visit: Payer: Self-pay | Admitting: Cardiovascular Disease

## 2022-06-03 ENCOUNTER — Other Ambulatory Visit: Payer: Self-pay

## 2022-06-03 DIAGNOSIS — S72142D Displaced intertrochanteric fracture of left femur, subsequent encounter for closed fracture with routine healing: Secondary | ICD-10-CM | POA: Diagnosis not present

## 2022-06-03 DIAGNOSIS — M25552 Pain in left hip: Secondary | ICD-10-CM | POA: Diagnosis not present

## 2022-06-03 MED ORDER — NITROGLYCERIN 0.4 MG SL SUBL: 0.4000 mg | SUBLINGUAL_TABLET | SUBLINGUAL | 10 refills | Status: DC | PRN

## 2022-06-03 NOTE — Telephone Encounter (Signed)
Pt's medication was sent to pt's pharmacy as requested. Confirmation received.  °

## 2022-06-15 ENCOUNTER — Other Ambulatory Visit: Payer: Self-pay | Admitting: Internal Medicine

## 2022-06-24 ENCOUNTER — Other Ambulatory Visit: Payer: Self-pay | Admitting: Cardiovascular Disease

## 2022-06-24 MED ORDER — ROSUVASTATIN CALCIUM 20 MG PO TABS: ORAL_TABLET | ORAL | 3 refills | Status: DC

## 2022-06-24 NOTE — Telephone Encounter (Signed)
Pt's medications were sent to pt's pharmacy as requested. Confirmation received.  

## 2022-07-04 ENCOUNTER — Other Ambulatory Visit: Payer: Self-pay | Admitting: Internal Medicine

## 2022-07-09 ENCOUNTER — Other Ambulatory Visit: Payer: Self-pay | Admitting: Internal Medicine

## 2022-07-09 ENCOUNTER — Other Ambulatory Visit: Payer: Self-pay

## 2022-07-09 MED ORDER — DESVENLAFAXINE SUCCINATE ER 50 MG PO TB24: 50.0000 mg | ORAL_TABLET | Freq: Every day | ORAL | 3 refills | Status: DC

## 2022-07-13 ENCOUNTER — Ambulatory Visit: Payer: Medicare Other | Admitting: Podiatry

## 2022-09-01 ENCOUNTER — Other Ambulatory Visit: Payer: Medicare Other

## 2022-09-01 DIAGNOSIS — E7849 Other hyperlipidemia: Secondary | ICD-10-CM

## 2022-09-01 DIAGNOSIS — F32A Depression, unspecified: Secondary | ICD-10-CM

## 2022-09-01 DIAGNOSIS — I10 Essential (primary) hypertension: Secondary | ICD-10-CM

## 2022-09-01 DIAGNOSIS — R5383 Other fatigue: Secondary | ICD-10-CM | POA: Diagnosis not present

## 2022-09-01 DIAGNOSIS — R7302 Impaired glucose tolerance (oral): Secondary | ICD-10-CM | POA: Diagnosis not present

## 2022-09-01 DIAGNOSIS — F419 Anxiety disorder, unspecified: Secondary | ICD-10-CM | POA: Diagnosis not present

## 2022-09-03 ENCOUNTER — Other Ambulatory Visit: Payer: Self-pay

## 2022-09-03 DIAGNOSIS — R7302 Impaired glucose tolerance (oral): Secondary | ICD-10-CM

## 2022-09-04 ENCOUNTER — Encounter: Payer: Self-pay | Admitting: Internal Medicine

## 2022-09-04 ENCOUNTER — Ambulatory Visit (INDEPENDENT_AMBULATORY_CARE_PROVIDER_SITE_OTHER): Payer: Medicare Other | Admitting: Internal Medicine

## 2022-09-04 VITALS — BP 108/62 | HR 64 | Temp 98.3°F | Ht 59.0 in | Wt 151.8 lb

## 2022-09-04 DIAGNOSIS — Z955 Presence of coronary angioplasty implant and graft: Secondary | ICD-10-CM

## 2022-09-04 DIAGNOSIS — F419 Anxiety disorder, unspecified: Secondary | ICD-10-CM

## 2022-09-04 DIAGNOSIS — I25119 Atherosclerotic heart disease of native coronary artery with unspecified angina pectoris: Secondary | ICD-10-CM

## 2022-09-04 DIAGNOSIS — F32A Depression, unspecified: Secondary | ICD-10-CM

## 2022-09-04 DIAGNOSIS — I252 Old myocardial infarction: Secondary | ICD-10-CM | POA: Diagnosis not present

## 2022-09-04 DIAGNOSIS — Z8679 Personal history of other diseases of the circulatory system: Secondary | ICD-10-CM

## 2022-09-04 DIAGNOSIS — R7302 Impaired glucose tolerance (oral): Secondary | ICD-10-CM

## 2022-09-04 DIAGNOSIS — Z800 Family history of malignant neoplasm of digestive organs: Secondary | ICD-10-CM

## 2022-09-04 DIAGNOSIS — F439 Reaction to severe stress, unspecified: Secondary | ICD-10-CM

## 2022-09-04 DIAGNOSIS — Z0000 Encounter for general adult medical examination without abnormal findings: Secondary | ICD-10-CM

## 2022-09-04 DIAGNOSIS — E7849 Other hyperlipidemia: Secondary | ICD-10-CM | POA: Diagnosis not present

## 2022-09-04 DIAGNOSIS — G4709 Other insomnia: Secondary | ICD-10-CM | POA: Diagnosis not present

## 2022-09-04 DIAGNOSIS — M48062 Spinal stenosis, lumbar region with neurogenic claudication: Secondary | ICD-10-CM

## 2022-09-04 DIAGNOSIS — I10 Essential (primary) hypertension: Secondary | ICD-10-CM

## 2022-09-04 DIAGNOSIS — K219 Gastro-esophageal reflux disease without esophagitis: Secondary | ICD-10-CM | POA: Diagnosis not present

## 2022-09-04 DIAGNOSIS — Z7901 Long term (current) use of anticoagulants: Secondary | ICD-10-CM

## 2022-09-04 LAB — LIPID PANEL
Cholesterol: 159 mg/dL (ref <200)
HDL: 85 mg/dL (ref >=50)
LDL Cholesterol (Calc): 54 mg/dL (calc)
Non-HDL Cholesterol (Calc): 74 mg/dL (calc) (ref <130)
Total CHOL/HDL Ratio: 1.9 (calc) (ref <5.0)
Triglycerides: 114 mg/dL (ref <150)

## 2022-09-04 LAB — CBC WITH DIFFERENTIAL/PLATELET
Absolute Monocytes: 316 cells/uL (ref 200–950)
Basophils Absolute: 28 cells/uL (ref 0–200)
Basophils Relative: 0.7 %
Eosinophils Absolute: 100 cells/uL (ref 15–500)
Eosinophils Relative: 2.5 %
HCT: 41.7 % (ref 35.0–45.0)
Hemoglobin: 13.7 g/dL (ref 11.7–15.5)
Lymphs Abs: 1296 cells/uL (ref 850–3900)
MCH: 29 pg (ref 27.0–33.0)
MCHC: 32.9 g/dL (ref 32.0–36.0)
MCV: 88.2 fL (ref 80.0–100.0)
MPV: 10.2 fL (ref 7.5–12.5)
Monocytes Relative: 7.9 %
Neutro Abs: 2260 cells/uL (ref 1500–7800)
Neutrophils Relative %: 56.5 %
Platelets: 193 10*3/uL (ref 140–400)
RBC: 4.73 10*6/uL (ref 3.80–5.10)
RDW: 12.8 % (ref 11.0–15.0)
Total Lymphocyte: 32.4 %
WBC: 4 10*3/uL (ref 3.8–10.8)

## 2022-09-04 LAB — COMPLETE METABOLIC PANEL WITH GFR
AG Ratio: 1.8 (calc) (ref 1.0–2.5)
ALT: 10 U/L (ref 6–29)
AST: 13 U/L (ref 10–35)
Albumin: 4.4 g/dL (ref 3.6–5.1)
Alkaline phosphatase (APISO): 79 U/L (ref 37–153)
BUN: 14 mg/dL (ref 7–25)
CO2: 34 mmol/L — ABNORMAL HIGH (ref 20–32)
Calcium: 9.7 mg/dL (ref 8.6–10.4)
Chloride: 98 mmol/L (ref 98–110)
Creat: 0.65 mg/dL (ref 0.60–0.95)
Globulin: 2.5 g/dL (calc) (ref 1.9–3.7)
Glucose, Bld: 119 mg/dL — ABNORMAL HIGH (ref 65–99)
Potassium: 3.8 mmol/L (ref 3.5–5.3)
Sodium: 143 mmol/L (ref 135–146)
Total Bilirubin: 0.3 mg/dL (ref 0.2–1.2)
Total Protein: 6.9 g/dL (ref 6.1–8.1)
eGFR: 88 mL/min/{1.73_m2} (ref >=60)

## 2022-09-04 LAB — POCT URINALYSIS DIPSTICK
Bilirubin, UA: NEGATIVE
Blood, UA: NEGATIVE
Glucose, UA: NEGATIVE
Ketones, UA: NEGATIVE
Leukocytes, UA: NEGATIVE
Nitrite, UA: NEGATIVE
Protein, UA: NEGATIVE
Spec Grav, UA: 1.01 (ref 1.010–1.025)
Urobilinogen, UA: 0.2 E.U./dL
pH, UA: 5 (ref 5.0–8.0)

## 2022-09-04 LAB — TSH: TSH: 2.57 mIU/L (ref 0.40–4.50)

## 2022-09-04 LAB — HEMOGLOBIN A1C W/OUT EAG: Hgb A1c MFr Bld: 6 % of total Hgb — ABNORMAL HIGH (ref <5.7)

## 2022-09-04 NOTE — Patient Instructions (Addendum)
Hemoccult card given. Vaccines discussed and will obtain at pharmacy. Labs reviewed and are stable.It was a pleasure to see you today.  Return in 1 year or as needed.  No change in medications.

## 2022-09-04 NOTE — Progress Notes (Signed)
Annual Wellness Visit     Patient: Carrie Chung, Female    DOB: [birth year], 81 y.o.   MRN: [id number] Visit Date: 09/04/2022  Chief Complaint  Patient presents with   Medicare Wellness   Subjective    Carrie Chung is a 81 y.o. female who presents today for her Annual Wellness Visit.  HPI 81 year old Female has not been seen here in person since January 2023.  At that time she was seen for follow-up on left hip fracture.  She last had Medicare wellness visit in September 2022.  This was done by CMA by phone.  She saw Dr. Burt Knack, her cardiologist in November 2022.  She had a video visit here with acute COVID-19 in December 2022.  She has seen Lebron Conners also for counseling with situational stress regarding her husband who has memory loss but is still living at home.  He is requiring more attention.  She has insomnia and has been prescribed Xanax.  She has history of oxygen desaturation  and sees Dr. Elsworth Soho.  Uses nocturnal oxygen.  Had normal pulmonary function testing in 2016.  Has not been found to have significant sleep apnea.  Patient elected not to pursue sleep study.  She is a former smoker.  Has a 10-year pack history and quit in 1982 according to Dr. Philbert Riser records.  She suffered a left intertrochanteric fracture in June 2022 after a fall with resultant left femoral neck fracture status post ORIF repaired by Dr. Fredonia Highland in July 2022.  Fall was on June 19, 2021.  She had a comminuted impacted acute left femoral intertrochanteric fracture.  She required physical therapy and was on rehab for a while and missed her annual in- person exam last year.  History of coronary artery disease with emergent PTCA with drug-eluting stent x1 to the mid RCA March 26, 2012.  She was returned to the Cath Lab the following day and had stents placed in the mid LAD, proximal circumflex arteries.  Subsequently was  enrolled in cardiac rehab.  Had abscess of buttock incised and drained by Dr.  Margot Chimes in September 2013.  Was admitted to the hospital with chest pain by Dr. Martinique in 2015.  MI was ruled out.  She had a stress echo that was normal.  Lower extremity Doppler showed no DVT.  Was admitted to the hospital in July 2015 with cellulitis of hand.  Had MRSA infection of the trunk in October 2015.  Seen here for lumbar radiculopathy in 2018.  History of GE reflux.  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  Past medical history: Tonsillectomy and adenoidectomy, appendectomy, removal of Morton's neuroma, history of herniated disc L3, L4,L5 in 1994.  Tear of supraspinatus muscle in September 2003.  C-section 1984.  Cholecystectomy 1979.  Right foot sesamoid fracture at age 37.  Right knee meniscal tear 1975.  Fractured pelvis 2001.  She is allergic to penicillin and tetracycline.  He has had adverse reactions to Ambien and trazodone. He has seen Dr. Vertell Limber for lumbar spinal stenosis in 2019. History of compression fracture at L3 seen at urgent care in 2019.  Social history: Her husband is a retired Pension scheme manager.  She has an adult daughter and 2 grandchildren.  She quit smoking over 20 years ago.  Social alcohol consumption.  Family history: Mother died at age 44 with history of cardiovascular disease.  Father died at age 68 with cancer of the colon.  1 daughter in good health.  2 sisters in good health.        Review of Systems see above   Objective    Vitals: BP 108/62   Pulse 64   Temp 98.3 F (36.8 C) (Tympanic)   Ht '4\' 11"'$  (1.499 m)   Wt 151 lb 12.8 oz (68.9 kg)   SpO2 96%   BMI 30.66 kg/m   Physical Exam skin: Warm and dry.  No cervical adenopathy.  No thyromegaly.  No carotid bruits.  Chest clear.  Cardiac exam: Regular rate and rhythm.  Breast are without masses.  Abdomen soft nondistended without hepatosplenomegaly masses or tenderness.  No lower extremity edema.  Brief neurological exam shows no gross focal deficits.   Most recent functional status  assessment:    09/04/2022   11:11 AM  In your present state of health, do you have any difficulty performing the following activities:  Hearing? 0  Vision? 0  Difficulty concentrating or making decisions? 0  Walking or climbing stairs? 1  Dressing or bathing? 0  Doing errands, shopping? 0  Preparing Food and eating ? N  Using the Toilet? N  In the past six months, have you accidently leaked urine? N  Do you have problems with loss of bowel control? N  Managing your Medications? N  Managing your Finances? N  Housekeeping or managing your Housekeeping? N   Most recent fall risk assessment:    09/04/2022   11:09 AM  Fall Risk   Falls in the past year? 1  Number falls in past yr: 0  Injury with Fall? 1  Risk for fall due to : Impaired mobility  Follow up Falls evaluation completed    Most recent depression screenings:    09/04/2022   11:10 AM 09/03/2021    9:35 AM  PHQ 2/9 Scores  PHQ - 2 Score 0 2  PHQ- 9 Score  3   Most recent cognitive screening:    09/04/2022   11:13 AM  6CIT Screen  What Year? 0 points  What time? 0 points  Count back from 20 0 points  Repeat phrase 0 points       Assessment & Plan   History of coronary artery disease status post stent placement followed by Dr. Burt Knack chronic anticoagulation with Plavix.  Is on Crestor 20 mg daily.  Situational stress with husband who has memory disorder  Essential hypertension treated with HCTZ, metoprolol, amlodipine  Anxiety and depression treated with Pristiq.  Takes Xanax 2 mg at bedtime.  Longstanding history of insomnia and anxiety.  Hyperlipidemia  Insomnia  Lumbar back pain  History of sleep disorder-not obstructive sleep apnea apparently  History of mild cognitive deficits seen by neurology  History of GE reflux treated with Nexium  History of mild glucose intolerance  Assessment and plan: Her hemoglobin A1c is stable at 6%.  Lipid panel is completely normal.  TSH is normal.  She has  seen Dennison Nancy in the past for counseling.  She is on chronic anticoagulation with Plavix.  Blood pressure is stable on Norvasc and HCTZ as well as Lopressor.  She is on Crestor 20 mg daily.  Plan: Her labs are stable.  She certainly has situational stress with her husband.  Medications seem to be helping her anxiety.  History of mild cognitive deficits.  She is able to drive.  Patient will return in 1 year or as needed.  Vaccines discussed.  Needs flu vaccine, COVID booster, RSV since she has grandchildren and should get  pneumococcal 20 up-to-date.  Given Hemoccult card to return to the office.    Annual wellness visit done today including the all of the following: Reviewed patient's Family Medical History Reviewed and updated list of patient's medical providers Assessment of cognitive impairment was done Assessed patient's functional ability Established a written schedule for health screening [place] Completed and Reviewed  Discussed health benefits of physical activity, and encouraged her to engage in regular exercise appropriate for her age and condition.         IElby Showers, MD, have reviewed all documentation for this visit. The documentation on 09/04/22 for the exam, diagnosis, procedures, and orders are all accurate and complete.   LaVon Barron Alvine, CMA

## 2022-09-13 ENCOUNTER — Other Ambulatory Visit: Payer: Self-pay | Admitting: Internal Medicine

## 2022-10-02 DIAGNOSIS — Z23 Encounter for immunization: Secondary | ICD-10-CM | POA: Diagnosis not present

## 2022-10-13 ENCOUNTER — Telehealth: Payer: Self-pay | Admitting: Pulmonary Disease

## 2022-10-13 DIAGNOSIS — J9611 Chronic respiratory failure with hypoxia: Secondary | ICD-10-CM

## 2022-10-13 NOTE — Telephone Encounter (Signed)
I called the patient and she reports she is due for a her annual overnight oxygen test. She wants to know if you are ok with [place] placing a new order for overnight oxygen test. Please advise.

## 2022-10-14 NOTE — Telephone Encounter (Signed)
Called and spoke with patient. She is aware that the order for the ONO has been placed. She verbalized understanding.   Nothing further needed at time of call.

## 2022-10-21 ENCOUNTER — Other Ambulatory Visit: Payer: Self-pay | Admitting: Cardiovascular Disease

## 2022-10-28 ENCOUNTER — Encounter: Payer: Self-pay | Admitting: Internal Medicine

## 2022-10-28 ENCOUNTER — Telehealth: Payer: Self-pay | Admitting: Internal Medicine

## 2022-10-28 MED ORDER — ALPRAZOLAM 2 MG PO TABS: ORAL_TABLET | ORAL | 4 refills | Status: DC

## 2022-10-28 NOTE — Telephone Encounter (Signed)
Refill Xanax 2 mg for 4 momnths. [place] PMP Aware checked. MJB, MD

## 2022-10-28 NOTE — Telephone Encounter (Signed)
Received Fax RX request from  [facility], [place] - [address] Phone: [phone]  Fax: [phone]      Medication - alprazolam Duanne Moron) 2 MG tablet   Last Refill - 09/03/2022  Last OV - 09/04/2022  Last CPE - 9/15/223  Next Appointment - 09/05/2022

## 2022-10-29 DIAGNOSIS — Z0289 Encounter for other administrative examinations: Secondary | ICD-10-CM

## 2022-10-30 ENCOUNTER — Telehealth: Payer: Self-pay | Admitting: Internal Medicine

## 2022-10-30 ENCOUNTER — Ambulatory Visit (INDEPENDENT_AMBULATORY_CARE_PROVIDER_SITE_OTHER): Payer: Medicare Other | Admitting: Internal Medicine

## 2022-10-30 ENCOUNTER — Encounter: Payer: Self-pay | Admitting: Internal Medicine

## 2022-10-30 VITALS — BP 136/70 | HR 73 | Temp 98.7°F

## 2022-10-30 DIAGNOSIS — F419 Anxiety disorder, unspecified: Secondary | ICD-10-CM

## 2022-10-30 DIAGNOSIS — I10 Essential (primary) hypertension: Secondary | ICD-10-CM

## 2022-10-30 DIAGNOSIS — K121 Other forms of stomatitis: Secondary | ICD-10-CM | POA: Diagnosis not present

## 2022-10-30 DIAGNOSIS — F439 Reaction to severe stress, unspecified: Secondary | ICD-10-CM

## 2022-10-30 DIAGNOSIS — F32A Depression, unspecified: Secondary | ICD-10-CM | POA: Diagnosis not present

## 2022-10-30 DIAGNOSIS — Z8679 Personal history of other diseases of the circulatory system: Secondary | ICD-10-CM

## 2022-10-30 DIAGNOSIS — I25119 Atherosclerotic heart disease of native coronary artery with unspecified angina pectoris: Secondary | ICD-10-CM

## 2022-10-30 MED ORDER — DOXYCYCLINE HYCLATE 100 MG PO TABS
100.0000 mg | ORAL_TABLET | Freq: Two times a day (BID) | ORAL | 0 refills | Status: DC
Start: 2022-10-30 — End: 2022-11-19

## 2022-10-30 NOTE — Telephone Encounter (Addendum)
Carrie Chung [phone]  [name] called to say her mouth hurts, she can not talk or eat, she said her tongue has gunk on it and she has ulcers. This has been going on for about 3 days. She would like to be seen today.

## 2022-10-30 NOTE — Telephone Encounter (Signed)
scheduled

## 2022-11-02 ENCOUNTER — Ambulatory Visit: Payer: Medicare Other | Attending: Cardiovascular Disease | Admitting: Cardiovascular Disease

## 2022-11-02 ENCOUNTER — Encounter: Payer: Self-pay | Admitting: Cardiovascular Disease

## 2022-11-02 VITALS — BP 128/82 | HR 81 | Ht 60.0 in | Wt 145.0 lb

## 2022-11-02 DIAGNOSIS — I10 Essential (primary) hypertension: Secondary | ICD-10-CM

## 2022-11-02 DIAGNOSIS — I25119 Atherosclerotic heart disease of native coronary artery with unspecified angina pectoris: Secondary | ICD-10-CM | POA: Diagnosis not present

## 2022-11-02 DIAGNOSIS — E782 Mixed hyperlipidemia: Secondary | ICD-10-CM

## 2022-11-02 MED ORDER — METOPROLOL TARTRATE 100 MG PO TABS: 100.0000 mg | ORAL_TABLET | Freq: Two times a day (BID) | ORAL | 3 refills | Status: DC

## 2022-11-02 NOTE — Patient Instructions (Signed)
Patient has been diagnosed with aphthous ulcers.  She will use Magic mouthwash 2 teaspoons swish do not swallow 4 times daily as needed.  Doxycycline 100 mg twice daily for 5 days.  Call if not better in 5 to 7 days or sooner if worse.

## 2022-11-02 NOTE — Progress Notes (Signed)
   Subjective:    Patient ID: Carrie Chung, female    DOB: [birth year], 81 y.o.   MRN: [id number]  HPI 81 year old Female seen today with mouth ulcers present for about 3 days.  Patient has situational stress with husband who has dementia.  Patient has history of anxiety and depression.  She has a history of coronary artery disease, hypertension, hypertriglyceridemia.  History of left femoral neck fracture requiring surgical repair June 19, 2021.  History of atrial tachycardia.  History of GE reflux.  History of mild cognitive deficits followed by neurology.  History of mild glucose intolerance.  History of coronary artery disease status post PTCA with drug-eluting stent x1 to the mid RCA in April 2013.  She returned to the Cath Lab and had stents placed in the mid LAD and proximal circumflex arteries.  Dr. Burt Knack is her Cardiologist.    Review of Systems no fever, chills, URI symptoms.     Objective:   Physical Exam  Temperature 98.7 degrees blood pressure 136/70 pulse 73, regular.  TMs are clear.  Neck is supple.  Chest is clear.  Cardiac exam regular rate and rhythm without ectopy.  Patient has 2 or 3 isolated aphthous ulcers in her mouth    Assessment & Plan:   Aphthous ulcers  Situational stress with husband who has dementia  History of coronary artery disease  Anxiety and depression  Hypertension  Plan: Doxycycline 100 mg twice daily for 5 days.  [facility] Magic mouthwash 2 teaspoons swish do not swallow 4 times daily as needed for aphthous ulcers.  Call if not better in 5 to 7 days or sooner if worse.

## 2022-11-02 NOTE — Progress Notes (Signed)
Cardiology Office Note:    Date:  11/02/2022   ID:  [name], [name] 1941/07/06, MRN [id number]  PCP:  [name], MD   [place] Providers Cardiologist:  [name], MD     Referring MD: [name], MD   Chief Complaint  Patient presents with   Coronary Artery Disease    History of Present Illness:    Carrie Chung is a 81 y.o. female with a hx of coronary artery disease, presenting for follow-up evaluation. The patient initially presented with non-ST elevation infarct in 2013.  She was found to have multivessel coronary artery disease and underwent multivessel stenting at that time.  She has been managed medically since then with no recurrent ischemic events.  The patient's last stress test in 2021 demonstrated no ischemia.   The patient is here alone today.  Her medications are unchanged.  She has been under more stress with her husband's progressive cognitive decline.  The patient has experienced some increase in her substernal chest discomfort to occurs generally at rest and is associated with stress and anxiety.  She has not required sublingual nitroglycerin.  She denies orthopnea, PND, lightheadedness, or syncope.  States that symptoms seem to occur when her heart rate is elevated and sometimes it feels like her heart is pounding.  Otherwise no specific complaints.  Past Medical History:  Diagnosis Date   Atrial tachycardia    a. noted at cardiac rehab 5/13; event monitor ordered to assess for AFib   CAD (coronary artery disease)    a. NSTEMI 03/2012 ([facility] 03/27/12: pLAD 30%, mLAD 99%, mCFX 95%, mRCA 99% with thrombus, EF 65%);  s/p PTCA/DESx1 to prox-mid RCA 03/27/12 urgently in setting of hypotension/bradycardia, with staged PTCA/Evolve study stent to mid LAD & PTCA/Evolve study stent to prox LCx 03/29/12 ;   echo 03/28/12:EF 60%, Aortic sclerosis without AS, mild RVE, mild reduced RVSF     Endometrial polyp    HTN (hypertension)    Hyperlipidemia     MI, old    Sleep related hypoxia     Past Surgical History:  Procedure Laterality Date   APPENDECTOMY  81 years old   CAROTID STENT  04/2012   x3   CESAREAN SECTION  1984   CHOLECYSTECTOMY  1978   INTRAMEDULLARY (IM) NAIL INTERTROCHANTERIC Left 06/20/2021   Procedure: INTRAMEDULLARY (IM) NAIL INTERTROCHANTRIC;  Surgeon: Renette Butters, MD;  Location: [facility];  Service: Orthopedics;  Laterality: Left;   LEFT HEART CATHETERIZATION WITH CORONARY ANGIOGRAM N/A 03/27/2012   Procedure: LEFT HEART CATHETERIZATION WITH CORONARY ANGIOGRAM;  Surgeon: Burnell Blanks, MD;  Location: [facility] CATH LAB;  Service: Cardiovascular;  Laterality: N/A;   PERCUTANEOUS CORONARY STENT INTERVENTION (PCI-S) N/A 03/27/2012   Procedure: PERCUTANEOUS CORONARY STENT INTERVENTION (PCI-S);  Surgeon: Burnell Blanks, MD;  Location: [facility] CATH LAB;  Service: Cardiovascular;  Laterality: N/A;   PERCUTANEOUS CORONARY STENT INTERVENTION (PCI-S) N/A 03/29/2012   Procedure: PERCUTANEOUS CORONARY STENT INTERVENTION (PCI-S);  Surgeon: [name], MD;  Location[facility] CATH LAB;  Service: Cardiovascular;  Laterality: N/A;   TONSILLECTOMY  81 years old    Current Medications: Current Meds  Medication Sig   acetaminophen (TYLENOL) 325 MG tablet Take 1-2 tablets (325-650 mg total) by mouth every 4 (four) hours as needed for mild pain.   alprazolam (XANAX) 2 MG tablet TAKE 1 TABLET(2 MG) BY MOUTH AT BEDTIME AS NEEDED FOR SLEEP   amLODipine (NORVASC) 5 MG tablet TAKE 1 TABLET BY MOUTH  EVERY DAY   aspirin 81 MG tablet Take 1 tablet (81 mg total) by mouth daily.   Calcium Carbonate-Vitamin D 600-400 MG-UNIT tablet Take 1 tablet by mouth every evening.   clopidogrel (PLAVIX) 75 MG tablet TAKE 1 TABLET(75 MG) BY MOUTH DAILY   desvenlafaxine (PRISTIQ) 50 MG 24 hr tablet Take 1 tablet (50 mg total) by mouth daily.   doxycycline (VIBRA-TABS) 100 MG tablet Take 1 tablet (100 mg total) by mouth 2 (two) times daily.   esomeprazole  (NEXIUM) 40 MG capsule Take 1 capsule (40 mg total) by mouth daily at 12 noon.   hydrochlorothiazide (HYDRODIURIL) 25 MG tablet TAKE 1 TABLET BY MOUTH EVERY DAY   metoprolol tartrate (LOPRESSOR) 100 MG tablet Take 1 tablet (100 mg total) by mouth 2 (two) times daily.   nitroGLYCERIN (NITROSTAT) 0.4 MG SL tablet Place 1 tablet (0.4 mg total) under the tongue every 5 (five) minutes as needed for chest pain.   OXYGEN Inhale into the lungs. Per patient using 2 liters at night.   prochlorperazine (COMPAZINE) 10 MG tablet Take 1 tablet (10 mg total) by mouth every 8 (eight) hours as needed for nausea or vomiting.   rosuvastatin (CRESTOR) 20 MG tablet TAKE 1 TABLET(20 MG) BY MOUTH DAILY   [DISCONTINUED] metoprolol tartrate (LOPRESSOR) 50 MG tablet TAKE 1 AND 1/2 TABLETS(75 MG) BY MOUTH TWICE DAILY     Allergies:   Penicillins, Sulfonamide derivatives, Lactose intolerance (gi), and Sulfamethoxazole   Social History   Socioeconomic History   Marital status: Married    Spouse name: Not on file   Number of children: 1   Years of education: Coll +   Highest education level: Not on file  Occupational History   Occupation: Retired Marine scientist  Tobacco Use   Smoking status: Former    Types: Cigarettes    Quit date: 04/19/1980    Years since quitting: 42.5   Smokeless tobacco: Never  Substance and Sexual Activity   Alcohol use: Yes    Alcohol/week: 20.0 standard drinks of alcohol    Types: 20 Glasses of wine per week    Comment: socially   Drug use: No   Sexual activity: Never  Other Topics Concern   Not on file  Social History Narrative   Caffeine 2 cups tea.   Social Determinants of Health   Financial Resource Strain: Low Risk  (09/03/2021)   Overall Financial Resource Strain (CARDIA)    Difficulty of Paying Living Expenses: Not hard at all  Food Insecurity: No Food Insecurity (09/03/2021)   Hunger Vital Sign    Worried About Running Out of Food in the Last Year: Never true    Ran Out of  Food in the Last Year: Never true  Transportation Needs: No Transportation Needs (09/03/2021)   PRAPARE - Hydrologist (Medical): No    Lack of Transportation (Non-Medical): No  Physical Activity: Inactive (09/03/2021)   Exercise Vital Sign    Days of Exercise per Week: 0 days    Minutes of Exercise per Session: 0 min  Stress: No Stress Concern Present (09/03/2021)   [place]    Feeling of Stress : Only a little  Social Connections: Socially Integrated (09/03/2021)   Social Connection and Isolation Panel [NHANES]    Frequency of Communication with Friends and Family: Twice a week    Frequency of Social Gatherings with Friends and Family: Twice a week    Attends  Religious Services: 1 to 4 times per year    Active Member of Clubs or Organizations: Yes    Attends Archivist Meetings: 1 to 4 times per year    Marital Status: Married     Family History: The patient's family history includes Cancer - Other in her father; Colon cancer in her father; Coronary artery disease in her mother.  ROS:   Please see the history of present illness.    All other systems reviewed and are negative.  EKGs/Labs/Other Studies Reviewed:    The following studies were reviewed today: Myoview Scan 11/22/2020: The left ventricular ejection fraction is hyperdynamic (>65%). Nuclear stress EF: 75%. There was no ST segment deviation noted during stress. The study is normal. This is a low risk study.   Low risk stress nuclear study with normal perfusion and normal left ventricular regional and global systolic function.  EKG:  EKG is ordered today.  The ekg ordered today demonstrates NSR 81 bpm, minimal voltage criteria for LVH may be normal variant, nonspecific ST and T wave abnormality  Recent Labs: 09/01/2022: ALT 10; BUN 14; Creat 0.65; Hemoglobin 13.7; Platelets 193; Potassium 3.8; Sodium 143; TSH 2.57   Recent Lipid Panel    Component Value Date/Time   CHOL 159 09/01/2022 0950   TRIG 114 09/01/2022 0950   HDL 85 09/01/2022 0950   CHOLHDL 1.9 09/01/2022 0950   VLDL 41 (H) 03/25/2017 1102   LDLCALC 54 09/01/2022 0950   LDLDIRECT 152.4 03/09/2012 0900     Risk Assessment/Calculations:                Physical Exam:    VS:  BP 128/82   Pulse 81   Ht 5' (1.524 m)   Wt 145 lb (65.8 kg)   SpO2 98%   BMI 28.32 kg/m     Wt Readings from Last 3 Encounters:  11/02/22 145 lb (65.8 kg)  09/04/22 151 lb 12.8 oz (68.9 kg)  05/13/22 146 lb (66.2 kg)     GEN:  Well nourished, well developed in no acute distress HEENT: Normal NECK: No JVD; No carotid bruits LYMPHATICS: No lymphadenopathy CARDIAC: RRR, no murmurs, rubs, gallops RESPIRATORY:  Clear to auscultation without rales, wheezing or rhonchi  ABDOMEN: Soft, non-tender, non-distended MUSCULOSKELETAL:  No edema; No deformity  SKIN: Warm and dry NEUROLOGIC:  Alert and oriented x 3 PSYCHIATRIC:  Normal affect   ASSESSMENT:    1. Coronary artery disease involving native coronary artery of native heart with angina pectoris ([place])   2. Mixed hyperlipidemia   3. Essential hypertension    PLAN:    In order of problems listed above:  Will increase metoprolol to 100 mg twice daily.  The patient has been under a lot more stress at home with her husband's illness.  If symptoms progress, she will notify us and we will move forward with repeat ischemic evaluation.  Her last stress test from 2 years ago is reviewed and showed no ischemia.  Her other medications will be continued without change.  She has been on long-term DAPT with aspirin and clopidogrel. Treated with rosuvastatin 20 mg daily.  Last lipids with a cholesterol of 159, HDL 85, LDL 84.  Continue current therapy. Blood pressure is controlled on amlodipine, hydrochlorothiazide, and metoprolol.  Increase metoprolol as above.  The patient will return for follow-up in 6  months or will notify us if her anginal chest discomfort does not improve.  Medication Adjustments/Labs and Tests Ordered: Current medicines are reviewed at length with the patient today.  Concerns regarding medicines are outlined above.  Orders Placed This Encounter  Procedures   EKG 12-Lead   Meds ordered this encounter  Medications   metoprolol tartrate (LOPRESSOR) 100 MG tablet    Sig: Take 1 tablet (100 mg total) by mouth 2 (two) times daily.    Dispense:  180 tablet    Refill:  3    Patient Instructions  Medication Instructions:  INCREASE Metoprolol tartrate to '100mg'$  twice daily *If you need a refill on your cardiac medications before your next appointment, please call your pharmacy*   Lab Work: NONE If you have labs (blood work) drawn today and your tests are completely normal, you will receive your results only by: [place] (if you have MyChart) OR A paper copy in the mail If you have any lab test that is abnormal or we need to change your treatment, we will call you to review the results.   Testing/Procedures: NONE  Follow-Up: At [facility], you and your health needs are our priority.  As part of our continuing mission to provide you with exceptional heart care, we have created designated Provider Care Teams.  These Care Teams include your primary Cardiologist (physician) and Advanced Practice Providers (APPs -  Physician Assistants and Nurse Practitioners) who all work together to provide you with the care you need, when you need it.  Your next appointment:   6 month(s)  The format for your next appointment:   In Person  Provider:   [name], MD  or APP     Important Information About Sugar         Signed, [name], MD  11/02/2022 8:43 PM    [place]

## 2022-11-02 NOTE — Patient Instructions (Signed)
Medication Instructions:  INCREASE Metoprolol tartrate to '100mg'$  twice daily *If you need a refill on your cardiac medications before your next appointment, please call your pharmacy*   Lab Work: NONE If you have labs (blood work) drawn today and your tests are completely normal, you will receive your results only by: [place] (if you have MyChart) OR A paper copy in the mail If you have any lab test that is abnormal or we need to change your treatment, we will call you to review the results.   Testing/Procedures: NONE  Follow-Up: At [facility], you and your health needs are our priority.  As part of our continuing mission to provide you with exceptional heart care, we have created designated Provider Care Teams.  These Care Teams include your primary Cardiologist (physician) and Advanced Practice Providers (APPs -  Physician Assistants and Nurse Practitioners) who all work together to provide you with the care you need, when you need it.  Your next appointment:   6 month(s)  The format for your next appointment:   In Person  Provider:   [name], MD  or APP     Important Information About Sugar

## 2022-11-19 ENCOUNTER — Encounter (HOSPITAL_BASED_OUTPATIENT_CLINIC_OR_DEPARTMENT_OTHER): Payer: Self-pay | Admitting: Pulmonary Disease

## 2022-11-19 ENCOUNTER — Ambulatory Visit (INDEPENDENT_AMBULATORY_CARE_PROVIDER_SITE_OTHER): Payer: Medicare Other | Admitting: Pulmonary Disease

## 2022-11-19 VITALS — BP 118/68 | HR 64 | Temp 98.3°F | Ht 60.0 in | Wt 146.0 lb

## 2022-11-19 DIAGNOSIS — I25119 Atherosclerotic heart disease of native coronary artery with unspecified angina pectoris: Secondary | ICD-10-CM

## 2022-11-19 DIAGNOSIS — J9611 Chronic respiratory failure with hypoxia: Secondary | ICD-10-CM

## 2022-11-19 NOTE — Progress Notes (Signed)
   Subjective:    Patient ID: Carrie Chung, female    DOB: 14-Oct-1941, 81 y.o.   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    Annual follow-up visit.  She reports some shortness of breath especially when climbing up stairs.  Her nocturnal oximetry scheduled for next week. She reports dryness of her nose and mouth during sleep, she is compliant with her oxygen. Her husband Floreen Comber has Alzheimer's and reduce distressing sometimes to be a caregiver. She had a hip fracture last year and ambulates with a cane She did not desaturate on ambulation  Significant tests/ events reviewed  10/2021 ONO/RA  sat <88% for 1h 45 mins during sleep 11/2020 ONO desaturation 52 minutes less than 88% in 3 hours less than 89% ONO  09/2019 >>desatn x 6h   PSG 04/2015 ([facility])- did not show significant OSA -AHI 4.5, RDI 7.6/hwhich showed desaturation for 106 minutes.     PFT 06/2015 Nl lung function. No airflow obstruction/restriction. DLCO nml .  CT angio chest 06/2015 neg for PE, mild emphysema .  Bubble echo with gg 1 DD , EF ok , no shunt.   Review of Systems neg for any significant sore throat, dysphagia, itching, sneezing, nasal congestion or excess/ purulent secretions, fever, chills, sweats, unintended wt loss, pleuritic or exertional cp, hempoptysis, orthopnea pnd or change in chronic leg swelling. Also denies presyncope, palpitations, heartburn, abdominal pain, nausea, vomiting, diarrhea or change in bowel or urinary habits, dysuria,hematuria, rash, arthralgias, visual complaints, headache, numbness weakness or ataxia.     Objective:   Physical Exam  Gen. Pleasant, well-nourished, in no distress ENT - no thrush, no pallor/icterus,no post nasal drip Neck: No JVD,  no thyromegaly, no carotid bruits Lungs: no use of accessory muscles, no dullness to percussion, clear without rales or rhonchi  Cardiovascular: Rhythm regular, heart sounds  normal, no murmurs or gallops, no peripheral edema Musculoskeletal: No deformities, no cyanosis or clubbing        Assessment & Plan:

## 2022-11-19 NOTE — Patient Instructions (Signed)
X AMb sat  Await oxygen report

## 2022-11-19 NOTE — Assessment & Plan Note (Signed)
Continue 1 L oxygen during sleep. Will provide her with humidifier. Check nocturnal oximetry on room air

## 2022-11-23 DIAGNOSIS — M25552 Pain in left hip: Secondary | ICD-10-CM | POA: Diagnosis not present

## 2022-11-24 DIAGNOSIS — R0683 Snoring: Secondary | ICD-10-CM | POA: Diagnosis not present

## 2022-11-24 DIAGNOSIS — G4730 Sleep apnea, unspecified: Secondary | ICD-10-CM | POA: Diagnosis not present

## 2022-11-26 ENCOUNTER — Telehealth: Payer: Self-pay | Admitting: Pulmonary Disease

## 2022-11-26 NOTE — Telephone Encounter (Signed)
ONO/ RA showed desaturation for only 27 minutes This is along the best report I have seen all these years. Would be okay from my standpoint if she would want to discontinue oxygen. If she wants to continue on it, that is okay to and this report will qualify her for oxygen

## 2022-11-26 NOTE — Telephone Encounter (Signed)
Called and notified patient of ONO results. She voiced understanding. She asked that we make her an appt to discuss further with Dr. Elsworth Soho. Appt scheduled for January. Nothing further needed

## 2022-12-30 ENCOUNTER — Ambulatory Visit (HOSPITAL_BASED_OUTPATIENT_CLINIC_OR_DEPARTMENT_OTHER): Payer: Medicare Other | Admitting: Pulmonary Disease

## 2023-01-21 ENCOUNTER — Ambulatory Visit (INDEPENDENT_AMBULATORY_CARE_PROVIDER_SITE_OTHER): Payer: Medicare Other | Admitting: Pulmonary Disease

## 2023-01-21 ENCOUNTER — Encounter (HOSPITAL_BASED_OUTPATIENT_CLINIC_OR_DEPARTMENT_OTHER): Payer: Self-pay | Admitting: Pulmonary Disease

## 2023-01-21 VITALS — BP 108/82 | HR 67 | Temp 98.8°F | Ht 59.0 in | Wt 151.2 lb

## 2023-01-21 DIAGNOSIS — J9611 Chronic respiratory failure with hypoxia: Secondary | ICD-10-CM | POA: Diagnosis not present

## 2023-01-21 NOTE — Progress Notes (Signed)
   Subjective:    Patient ID: Carrie Chung, female    DOB: 1941-07-11, 82 y.o.   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    She still ambulates with a cane.  We reviewed repeat nocturnal oximetry which showed desaturation for 27 minutes out of 7-hour recording.  She states that she still wakes up with saturation less than 90%. Dyspnea is at baseline. No wheezing, cough, pedal edema or orthopnea She is sleeping with head of bed elevated and trying to sleep on her back  Significant tests/ events reviewed  11/2022 ONO/ RA showed desaturation for only 27 minutes  10/2021 ONO/RA  sat <88% for 1h 45 mins during sleep 11/2020 ONO desaturation 52 minutes less than 88% in 3 hours less than 89% ONO  09/2019 >>desatn x 6h   PSG 04/2015 ([facility])- did not show significant OSA -AHI 4.5, RDI 7.6/hwhich showed desaturation for 106 minutes.     PFT 06/2015 Nl lung function. No airflow obstruction/restriction. DLCO nml .  CT angio chest 06/2015 neg for PE, mild emphysema .  Bubble echo with gg 1 DD , EF ok , no shunt.    Review of Systems neg for any significant sore throat, dysphagia, itching, sneezing, nasal congestion or excess/ purulent secretions, fever, chills, sweats, unintended wt loss, pleuritic or exertional cp, hempoptysis, orthopnea pnd or change in chronic leg swelling. Also denies presyncope, palpitations, heartburn, abdominal pain, nausea, vomiting, diarrhea or change in bowel or urinary habits, dysuria,hematuria, rash, arthralgias, visual complaints, headache, numbness weakness or ataxia.     Objective:   Physical Exam  Gen. Pleasant, well-nourished, in no distress, normal affect ENT - no pallor,icterus, no post nasal drip Neck: No JVD, no  thyromegaly, no carotid bruits Lungs: no use of accessory muscles, no dullness to percussion, clear without rales or rhonchi  Cardiovascular: Rhythm regular, heart sounds  normal, no murmurs or gallops, no peripheral edema Abdomen: soft and non-tender, no hepatosplenomegaly, BS normal. Musculoskeletal: No deformities, no cyanosis or clubbing Neuro:  alert, non focal        Assessment & Plan:

## 2023-01-21 NOTE — Patient Instructions (Addendum)
Repeat noct oximetry on RA Please keep record of morning sat daily x 57month& we can discuss

## 2023-01-21 NOTE — Assessment & Plan Note (Addendum)
We reviewed nocturnal oximetry.  He desaturation events seem to be clustering indicating REM relation.  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 This nocturnal oximetry record is the best she has had any [place], previous study showed desaturation more than an hour including 1 study which showed desaturation for 6 hours.  Not sure what has improved. She has previously expressed that she does not want CPAP therapy so I will not pursue another sleep study.  We will repeat nocturnal oximetry on room air I have also asked her to keep a record of her morning oxygen saturations.  Based on this we will decide whether to continue with the oxygen or not  She is worried about effect of burden of hypoxia on cognition

## 2023-02-02 DIAGNOSIS — R0683 Snoring: Secondary | ICD-10-CM | POA: Diagnosis not present

## 2023-02-02 DIAGNOSIS — G4730 Sleep apnea, unspecified: Secondary | ICD-10-CM | POA: Diagnosis not present

## 2023-02-15 ENCOUNTER — Telehealth: Payer: Self-pay | Admitting: Pulmonary Disease

## 2023-02-15 NOTE — Telephone Encounter (Signed)
Nocturnal oximetry on room air continues to show desaturation less than 88% for more than 2 hours less than 89% for 5 hours. Suggest that she continue to use oxygen during sleep

## 2023-02-16 NOTE — Telephone Encounter (Signed)
Spoke with patient in regards to ONO results. She verbalized understanding. Nothing further needed.

## 2023-03-05 ENCOUNTER — Encounter: Payer: Self-pay | Admitting: Internal Medicine

## 2023-03-05 ENCOUNTER — Ambulatory Visit (INDEPENDENT_AMBULATORY_CARE_PROVIDER_SITE_OTHER): Payer: Medicare Other | Admitting: Internal Medicine

## 2023-03-05 VITALS — BP 112/64 | HR 62 | Temp 98.5°F

## 2023-03-05 DIAGNOSIS — J029 Acute pharyngitis, unspecified: Secondary | ICD-10-CM | POA: Diagnosis not present

## 2023-03-05 LAB — POCT RAPID STREP A (OFFICE): Rapid Strep A Screen: NEGATIVE

## 2023-03-05 MED ORDER — HYDROCODONE BIT-HOMATROP MBR 5-1.5 MG/5ML PO SOLN: 5.0000 mL | Freq: Three times a day (TID) | ORAL | 0 refills | Status: DC | PRN

## 2023-03-05 MED ORDER — AZITHROMYCIN 250 MG PO TABS: ORAL_TABLET | ORAL | 0 refills | Status: AC

## 2023-03-05 NOTE — Progress Notes (Signed)
Patient Care Team: [name], MD as PCP - General (Internal Medicine) [name], MD as PCP - Cardiology (Cardiology) Renold Genta Cresenciano Lick, MD (Internal Medicine)  Visit Date: 03/05/23  Subjective:    Patient ID: Carrie Chung , Female   DOB: 05-22-1941, 82 y.o.    MRN: [id number]   82 y.o. Female presents today for sore throat, congestion. Patient has a past medical history of atrial tachycardia, coronary artery disease, endometrial polyp, hypertension, hyperlipidemia, myocardial infarction, sleep related hypoxia.  Reports experiencing cough, congestion for several weeks. Sore throat started on 03/02/23. Denies sputum production. Reports negative Covid-19 test. Taking Tylenol at home. Reports her husband has been sick recently.   Past Medical History:  Diagnosis Date   Atrial tachycardia    a. noted at cardiac rehab 5/13; event monitor ordered to assess for AFib   CAD (coronary artery disease)    a. NSTEMI 03/2012 ([facility] 03/27/12: pLAD 30%, mLAD 99%, mCFX 95%, mRCA 99% with thrombus, EF 65%);  s/p PTCA/DESx1 to prox-mid RCA 03/27/12 urgently in setting of hypotension/bradycardia, with staged PTCA/Evolve study stent to mid LAD & PTCA/Evolve study stent to prox LCx 03/29/12 ;   echo 03/28/12:EF 60%, Aortic sclerosis without AS, mild RVE, mild reduced RVSF     Endometrial polyp    HTN (hypertension)    Hyperlipidemia    MI, old    Sleep related hypoxia      Family History  Problem Relation Age of Onset   Coronary artery disease Mother        CABG at age 65, PPM   Colon cancer Father    Cancer - Other Father        larnyx    Social History   Social History Narrative   Caffeine 2 cups tea.      Review of Systems  Constitutional:  Negative for fever and malaise/fatigue.  HENT:  Positive for congestion and sore throat.   Eyes:  Negative for blurred vision.  Respiratory:  Positive for cough. Negative for sputum production and shortness of breath.   Cardiovascular:   Negative for chest pain, palpitations and leg swelling.  Gastrointestinal:  Negative for vomiting.  Musculoskeletal:  Negative for back pain.  Skin:  Negative for rash.  Neurological:  Negative for loss of consciousness and headaches.        Objective:   Vitals: BP 112/64   Pulse 62   Temp 98.5 F (36.9 C) (Tympanic)   SpO2 97%    Physical Exam Vitals and nursing note reviewed.  Constitutional:      General: She is not in acute distress.    Appearance: Normal appearance. She is not toxic-appearing.  HENT:     Head: Normocephalic and atraumatic.     Mouth/Throat:     Pharynx: Posterior oropharyngeal erythema present.     Comments: Discolored nasal drainage in pharynx. Pulmonary:     Effort: Pulmonary effort is normal. No respiratory distress.     Breath sounds: Normal breath sounds. No wheezing or rales.  Skin:    General: Skin is warm and dry.  Neurological:     Mental Status: She is alert and oriented to person, place, and time. Mental status is at baseline.  Psychiatric:        Mood and Affect: Mood normal.        Behavior: Behavior normal.        Thought Content: Thought content normal.        Judgment: Judgment  normal.       Results:   Studies obtained and personally reviewed by me:   Labs:       Component Value Date/Time   NA 143 09/01/2022 0950   NA 142 05/13/2022 1142   K 3.8 09/01/2022 0950   CL 98 09/01/2022 0950   CO2 34 (H) 09/01/2022 0950   GLUCOSE 119 (H) 09/01/2022 0950   BUN 14 09/01/2022 0950   BUN 11 05/13/2022 1142   CREATININE 0.65 09/01/2022 0950   CALCIUM 9.7 09/01/2022 0950   PROT 6.9 09/01/2022 0950   PROT 6.7 12/29/2017 1427   ALBUMIN 2.7 (L) 06/25/2021 0515   ALBUMIN 4.2 12/29/2017 1427   AST 13 09/01/2022 0950   ALT 10 09/01/2022 0950   ALKPHOS 70 06/25/2021 0515   BILITOT 0.3 09/01/2022 0950   BILITOT 0.3 12/29/2017 1427   GFRNONAA >60 07/10/2021 0626   GFRNONAA 68 08/16/2020 1156   GFRAA 79 08/16/2020 1156      Lab Results  Component Value Date   WBC 4.0 09/01/2022   HGB 13.7 09/01/2022   HCT 41.7 09/01/2022   MCV 88.2 09/01/2022   PLT 193 09/01/2022    Lab Results  Component Value Date   CHOL 159 09/01/2022   HDL 85 09/01/2022   LDLCALC 54 09/01/2022   LDLDIRECT 152.4 03/09/2012   TRIG 114 09/01/2022   CHOLHDL 1.9 09/01/2022    Lab Results  Component Value Date   HGBA1C 6.0 (H) 09/01/2022     Lab Results  Component Value Date   TSH 2.57 09/01/2022      Assessment & Plan:   Acute pharyngitis: prescribed azithromycin Z-Pak two tabs day 1 followed by one tab days 2-5, Hycodan syrup 1 teaspoon every 6 hours as needed for cough or sore throat pain.    I,Alexander Ruley,acting as a Education administrator for [name], MD.,have documented all relevant documentation on the behalf of [name], MD,as directed by  [name], MD while in the presence of [name], MD.   I, [name], MD, have reviewed all documentation for this visit. The documentation on 03/15/23 for the exam, diagnosis, procedures, and orders are all accurate and complete.

## 2023-03-15 NOTE — Patient Instructions (Signed)
Take Zithromax Z-pak 2 tabs day 1 followed by one tab days 2-5. Rest and stay well hydrated. Hycodan  one teaspoon sparingly if needed for cough or sore throat pain.

## 2023-04-12 ENCOUNTER — Encounter: Payer: Self-pay | Admitting: Podiatry

## 2023-04-12 ENCOUNTER — Ambulatory Visit (INDEPENDENT_AMBULATORY_CARE_PROVIDER_SITE_OTHER): Payer: Medicare Other | Admitting: Podiatry

## 2023-04-12 DIAGNOSIS — B351 Tinea unguium: Secondary | ICD-10-CM | POA: Diagnosis not present

## 2023-04-12 DIAGNOSIS — M79675 Pain in left toe(s): Secondary | ICD-10-CM

## 2023-04-12 DIAGNOSIS — M79674 Pain in right toe(s): Secondary | ICD-10-CM | POA: Diagnosis not present

## 2023-04-12 NOTE — Progress Notes (Signed)
This patient returns to the office for evaluation and treatment of long thick painful nails .  This patient is unable to trim her own nails since the patient cannot reach her feet.  She has history of hip surgery which prevents her from touching her feet.  Patient says the nails are painful walking and wearing his shoes.  He returns for preventive foot care services.  General Appearance  Alert, conversant and in no acute stress.  Vascular  Dorsalis pedis and posterior tibial  pulses are absent  bilaterally.  Capillary return is within normal limits  bilaterally. Temperature is within normal limits  bilaterally.  Neurologic  Senn-Weinstein monofilament wire test within normal limits  bilaterally. Muscle power within normal limits bilaterally.  Nails Thick disfigured discolored nails with subungual debris  from hallux to fifth toes bilaterally. No evidence of bacterial infection or drainage bilaterally.  Orthopedic  No limitations of motion  feet .  No crepitus or effusions noted.  HAV  B/L.  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  No signs of infections or ulcers noted.     Onychomycosis  Pain in toes right foot  Pain in toes left foot  Debridement  of nails  1-5  B/L with a nail nipper.  Nails were then filed using a dremel tool with no incidents.    RTC 3   months    Helane Gunther DPM

## 2023-05-10 ENCOUNTER — Encounter: Payer: Self-pay | Admitting: Cardiovascular Disease

## 2023-05-10 ENCOUNTER — Ambulatory Visit: Payer: Medicare Other | Attending: Cardiovascular Disease | Admitting: Cardiovascular Disease

## 2023-05-10 ENCOUNTER — Ambulatory Visit: Payer: Medicare Other | Attending: Cardiovascular Disease

## 2023-05-10 VITALS — BP 144/76 | HR 73 | Ht 60.0 in | Wt 153.6 lb

## 2023-05-10 DIAGNOSIS — R002 Palpitations: Secondary | ICD-10-CM | POA: Diagnosis not present

## 2023-05-10 DIAGNOSIS — I25119 Atherosclerotic heart disease of native coronary artery with unspecified angina pectoris: Secondary | ICD-10-CM | POA: Diagnosis not present

## 2023-05-10 DIAGNOSIS — E782 Mixed hyperlipidemia: Secondary | ICD-10-CM | POA: Insufficient documentation

## 2023-05-10 DIAGNOSIS — I10 Essential (primary) hypertension: Secondary | ICD-10-CM

## 2023-05-10 NOTE — Progress Notes (Signed)
Cardiology Office Note:    Date:  05/10/2023   ID:  Carrie Chung, Carrie Chung 1941/07/30, MRN [id number]  PCP:  Margaree Mackintosh, MD   [facility] Providers Cardiologist:  Tonny Bollman, MD     Referring MD: Margaree Mackintosh, MD   Chief Complaint  Patient presents with   Coronary Artery Disease    History of Present Illness:    Carrie Chung is a 82 y.o. female with a hx of coronary artery disease, presenting for follow-up evaluation. The patient initially presented with non-ST elevation infarct in 2013.  She was found to have multivessel coronary artery disease and underwent multivessel stenting at that time.  She has been managed medically since then with no recurrent ischemic events.  The patient's last stress test in 2021 demonstrated no ischemia.   She is here alone today.  She does have some episodes of substernal chest burning and discomfort at night.  She thinks this is probably reflux, but she is not sure.  She has mild shortness of breath with activity.  No clear exertional angina.  No PND, leg swelling, or lightheadedness.  The patient is compliant with her medications.  She still feels like her heart is racing at times, even after metoprolol was increased at the time of her last visit.  Overall her heart rate is better since she has increased her metoprolol.    Past Medical History:  Diagnosis Date   Atrial tachycardia    a. noted at cardiac rehab 5/13; event monitor ordered to assess for AFib   CAD (coronary artery disease)    a. NSTEMI 03/2012 (LHC 03/27/12: pLAD 30%, mLAD 99%, mCFX 95%, mRCA 99% with thrombus, EF 65%);  s/p PTCA/DESx1 to prox-mid RCA 03/27/12 urgently in setting of hypotension/bradycardia, with staged PTCA/Evolve study stent to mid LAD & PTCA/Evolve study stent to prox LCx 03/29/12 ;   echo 03/28/12:EF 60%, Aortic sclerosis without AS, mild RVE, mild reduced RVSF     Endometrial polyp    HTN (hypertension)    Hyperlipidemia    MI, old    Sleep  related hypoxia     Past Surgical History:  Procedure Laterality Date   APPENDECTOMY  82 years old   CAROTID STENT  04/2012   x3   CESAREAN SECTION  1984   CHOLECYSTECTOMY  1978   INTRAMEDULLARY (IM) NAIL INTERTROCHANTERIC Left 06/20/2021   Procedure: INTRAMEDULLARY (IM) NAIL INTERTROCHANTRIC;  Surgeon: Sheral Apley, MD;  Location: [facility] OR;  Service: Orthopedics;  Laterality: Left;   LEFT HEART CATHETERIZATION WITH CORONARY ANGIOGRAM N/A 03/27/2012   Procedure: LEFT HEART CATHETERIZATION WITH CORONARY ANGIOGRAM;  Surgeon: Kathleene Hazel, MD;  Location: [facility] CATH LAB;  Service: Cardiovascular;  Laterality: N/A;   PERCUTANEOUS CORONARY STENT INTERVENTION (PCI-S) N/A 03/27/2012   Procedure: PERCUTANEOUS CORONARY STENT INTERVENTION (PCI-S);  Surgeon: Kathleene Hazel, MD;  Location: [facility] CATH LAB;  Service: Cardiovascular;  Laterality: N/A;   PERCUTANEOUS CORONARY STENT INTERVENTION (PCI-S) N/A 03/29/2012   Procedure: PERCUTANEOUS CORONARY STENT INTERVENTION (PCI-S);  Surgeon: Tonny Bollman, MD;  Location: [facility] CATH LAB;  Service: Cardiovascular;  Laterality: N/A;   TONSILLECTOMY  82 years old    Current Medications: Current Meds  Medication Sig   acetaminophen (TYLENOL) 325 MG tablet Take 1-2 tablets (325-650 mg total) by mouth every 4 (four) hours as needed for mild pain.   alprazolam (XANAX) 2 MG tablet TAKE 1 TABLET(2 MG) BY MOUTH AT BEDTIME AS NEEDED FOR SLEEP   amLODipine (NORVASC)  5 MG tablet TAKE 1 TABLET BY MOUTH EVERY DAY   aspirin 81 MG tablet Take 1 tablet (81 mg total) by mouth daily.   Calcium Carbonate-Vitamin D 600-400 MG-UNIT tablet Take 1 tablet by mouth every evening.   clopidogrel (PLAVIX) 75 MG tablet TAKE 1 TABLET(75 MG) BY MOUTH DAILY   desvenlafaxine (PRISTIQ) 50 MG 24 hr tablet Take 1 tablet (50 mg total) by mouth daily.   esomeprazole (NEXIUM) 40 MG capsule Take 1 capsule (40 mg total) by mouth daily at 12 noon.   folic acid-pyridoxine-cyancobalamin (FOLBIC)  2.5-25-2 MG TABS tablet Take by mouth.   hydrochlorothiazide (HYDRODIURIL) 25 MG tablet TAKE 1 TABLET BY MOUTH EVERY DAY   HYDROcodone bit-homatropine (HYCODAN) 5-1.5 MG/5ML syrup Take 5 mLs by mouth every 8 (eight) hours as needed for cough.   metoprolol tartrate (LOPRESSOR) 100 MG tablet Take 1 tablet (100 mg total) by mouth 2 (two) times daily.   nitroGLYCERIN (NITROSTAT) 0.4 MG SL tablet Place 1 tablet (0.4 mg total) under the tongue every 5 (five) minutes as needed for chest pain.   OXYGEN Inhale into the lungs. Per patient using 2 liters at night.   prochlorperazine (COMPAZINE) 10 MG tablet Take 1 tablet (10 mg total) by mouth every 8 (eight) hours as needed for nausea or vomiting.   rosuvastatin (CRESTOR) 20 MG tablet TAKE 1 TABLET(20 MG) BY MOUTH DAILY     Allergies:   Penicillins, Sulfonamide derivatives, Lactose intolerance (gi), and Sulfamethoxazole   Social History   Socioeconomic History   Marital status: Married    Spouse name: Not on file   Number of children: 1   Years of education: Coll +   Highest education level: Not on file  Occupational History   Occupation: Retired Engineer, civil (consulting)  Tobacco Use   Smoking status: Former    Types: Cigarettes    Quit date: 04/19/1980    Years since quitting: 43.0   Smokeless tobacco: Never  Substance and Sexual Activity   Alcohol use: Yes    Alcohol/week: 20.0 standard drinks of alcohol    Types: 20 Glasses of wine per week    Comment: socially   Drug use: No   Sexual activity: Never  Other Topics Concern   Not on file  Social History Narrative   Caffeine 2 cups tea.   Social Determinants of Health   Financial Resource Strain: Low Risk  (09/03/2021)   Overall Financial Resource Strain (CARDIA)    Difficulty of Paying Living Expenses: Not hard at all  Food Insecurity: No Food Insecurity (09/03/2021)   Hunger Vital Sign    Worried About Running Out of Food in the Last Year: Never true    Ran Out of Food in the Last Year: Never true   Transportation Needs: No Transportation Needs (09/03/2021)   PRAPARE - Administrator, Civil Service (Medical): No    Lack of Transportation (Non-Medical): No  Physical Activity: Inactive (09/03/2021)   Exercise Vital Sign    Days of Exercise per Week: 0 days    Minutes of Exercise per Session: 0 min  Stress: No Stress Concern Present (09/03/2021)   [employer] of Occupational Health - Occupational Stress Questionnaire    Feeling of Stress : Only a little  Social Connections: Socially Integrated (09/03/2021)   Social Connection and Isolation Panel [NHANES]    Frequency of Communication with Friends and Family: Twice a week    Frequency of Social Gatherings with Friends and Family: Twice a week  Attends Religious Services: 1 to 4 times per year    Active Member of Clubs or Organizations: Yes    Attends Banker Meetings: 1 to 4 times per year    Marital Status: Married     Family History: The patient's family history includes Cancer - Other in her father; Colon cancer in her father; Coronary artery disease in her mother.  ROS:   Please see the history of present illness.    All other systems reviewed and are negative.  EKGs/Labs/Other Studies Reviewed:    The following studies were reviewed today: Cardiac Studies & Procedures     STRESS TESTS  MYOCARDIAL PERFUSION IMAGING 11/22/2020  Narrative  The left ventricular ejection fraction is hyperdynamic (>65%).  Nuclear stress EF: 75%.  There was no ST segment deviation noted during stress.  The study is normal.  This is a low risk study.  Low risk stress nuclear study with normal perfusion and normal left ventricular regional and global systolic function.               EKG:  EKG is not ordered today.    Recent Labs: 09/01/2022: ALT 10; BUN 14; Creat 0.65; Hemoglobin 13.7; Platelets 193; Potassium 3.8; Sodium 143; TSH 2.57  Recent Lipid Panel    Component Value Date/Time   CHOL 159  09/01/2022 0950   TRIG 114 09/01/2022 0950   HDL 85 09/01/2022 0950   CHOLHDL 1.9 09/01/2022 0950   VLDL 41 (H) 03/25/2017 1102   LDLCALC 54 09/01/2022 0950   LDLDIRECT 152.4 03/09/2012 0900     Risk Assessment/Calculations:          Physical Exam:    VS:  BP (!) 144/76   Pulse 73   Ht 5' (1.524 m)   Wt 153 lb 9.6 oz (69.7 kg)   SpO2 96%   BMI 30.00 kg/m     Wt Readings from Last 3 Encounters:  05/10/23 153 lb 9.6 oz (69.7 kg)  01/21/23 151 lb 3.2 oz (68.6 kg)  11/19/22 146 lb (66.2 kg)     GEN:  Well nourished, well developed pleasant elderly woman in no acute distress HEENT: Normal NECK: No JVD; No carotid bruits LYMPHATICS: No lymphadenopathy CARDIAC: RRR, 2/6 SEM at the RUSB RESPIRATORY:  Clear to auscultation without rales, wheezing or rhonchi  ABDOMEN: Soft, non-tender, non-distended MUSCULOSKELETAL:  No edema; No deformity  SKIN: Warm and dry NEUROLOGIC:  Alert and oriented x 3 PSYCHIATRIC:  Normal affect   ASSESSMENT:    1. Coronary artery disease involving native coronary artery of native heart with angina pectoris (HCC)   2. Mixed hyperlipidemia   3. Essential hypertension   4. Palpitations    PLAN:    In order of problems listed above:  Treated with aspirin, clopidogrel, metoprolol, amlodipine, and high intensity statin drug.  She has undergone remote multivessel stenting as outlined above.  She is having some anginal symptoms with both typical and atypical features.  Recommend a Lexiscan Myoview stress test for further evaluation.  Her last stress study from 2021 is personally reviewed as outlined above and this was a low risk study with no significant ischemia. Treated with rosuvastatin 20 mg daily.  Last lipids demonstrated an LDL cholesterol of 54.  Patient at goal, continue current management. Blood pressure has been controlled on current regimen. Recommend a 3-day ZIO monitor to better evaluate her tachycardia and symptoms of heart palpitations  with elevated heart rate.  Medication Adjustments/Labs and Tests Ordered: Current medicines are reviewed at length with the patient today.  Concerns regarding medicines are outlined above.  Orders Placed This Encounter  Procedures   LONG TERM MONITOR (3-14 DAYS)   MYOCARDIAL PERFUSION IMAGING   No orders of the defined types were placed in this encounter.   Patient Instructions  Medication Instructions:  Your physician recommends that you continue on your current medications as directed. Please refer to the Current Medication list given to you today.  *If you need a refill on your cardiac medications before your next appointment, please call your pharmacy*  Lab Work: None ordered today.  Testing/Procedures: Your physician has requested that you have a lexiscan myoview. For further information please visit [email or web address] Please follow instruction sheet, as given.   Your physician has requested that you wear a Zio heart monitor for 3 days. This will be mailed to your home with instructions on how to apply the monitor and how to return it when finished. Please allow 2 weeks after returning the heart monitor before our office calls you with the results.   Follow-Up: At [facility], you and your health needs are our priority.  As part of our continuing mission to provide you with exceptional heart care, we have created designated Provider Care Teams.  These Care Teams include your primary Cardiologist (physician) and Advanced Practice Providers (APPs -  Physician Assistants and Nurse Practitioners) who all work together to provide you with the care you need, when you need it.  Your next appointment:   6 month(s)  The format for your next appointment:   In Person  Provider:   Tonny Bollman, MD  or Jari Favre, PA-C, Robin Searing, NP, Eligha Bridegroom, NP, or Tereso Newcomer, PA-C     Other Instructions Steffanie Dunn (Stress Test) Instructions Please arrive 15  minutes prior to your appointment time for registration and insurance purposes.  The test will take approximately 3 to 4 hours to complete; you may bring reading material.  If someone comes with you to your appointment, they will need to remain in the main lobby due to limited space in the testing area. **If you are pregnant or breastfeeding, please notify the nuclear lab prior to your appointment**  How to prepare for your Myocardial Perfusion Test: Do not eat or drink 3 hours prior to your test, except you may have water. Do not consume products containing caffeine (regular or decaffeinated) 12 hours prior to your test. (ex: coffee, chocolate, sodas, tea). Do bring a list of your current medications with you.  If not listed below, you may take your medications as normal. Do wear comfortable clothes (no dresses or overalls) and walking shoes, tennis shoes preferred (No heels or open toe shoes are allowed). Do NOT wear cologne, perfume, aftershave, or lotions (deodorant is allowed). If these instructions are not followed, your test will have to be rescheduled.  Please report to [address] for your test.  If you have questions or concerns about your appointment, you can call the Nuclear Lab at [phone].  If you cannot keep your appointment, please provide 24 hours notification to the Nuclear Lab, to avoid a possible $50 charge to your account.   ZIO XT- Long Term Monitor Instructions     Your physician has requested you wear a ZIO patch monitor for 3 days.  This is a single patch monitor. [employer] supplies one patch monitor per enrollment. Additional  stickers are not available. Please  do not apply patch if you will be having a Nuclear Stress Test,  Echocardiogram, Cardiac CT, MRI, or Chest Xray during the period you would be wearing the  monitor. The patch cannot be worn during these tests. You cannot remove and re-apply the  ZIO XT patch monitor.  Your ZIO patch  monitor will be mailed 3 day USPS to your address on file. It may take 3-5 days  to receive your monitor after you have been enrolled.  Once you have received your monitor, please review the enclosed instructions. Your monitor  has already been registered assigning a specific monitor serial # to you.     Billing and Patient Assistance Program Information     We have supplied [employer] with any of your insurance information on file for billing purposes.  [employer] offers a sliding scale Patient Assistance Program for patients that do not have  insurance, or whose insurance does not completely cover the cost of the ZIO monitor.  You must apply for the Patient Assistance Program to qualify for this discounted rate.  To apply, please call [employer] at [phone], select option 4, select option 2, ask to apply for  Patient Assistance Program. Meredeth Ide will ask your household income, and how many people  are in your household. They will quote your out-of-pocket cost based on that information.  [employer] will also be able to set up a 58-month, interest-free payment plan if needed.     Applying the monitor     Shave hair from upper left chest.  Hold abrader disc by orange tab. Rub abrader in 40 strokes over the upper left chest as  indicated in your monitor instructions.  Clean area with 4 enclosed alcohol pads. Let dry.  Apply patch as indicated in monitor instructions. Patch will be placed under collarbone on left  side of chest with arrow pointing upward.  Rub patch adhesive wings for 2 minutes. Remove white label marked "1". Remove the white  label marked "2". Rub patch adhesive wings for 2 additional minutes.  While looking in a mirror, press and release button in center of patch. A small green light will  flash 3-4 times. This will be your only indicator that the monitor has been turned on.  Do not shower for the first 24 hours. You may shower after the first 24 hours.  Press the button if you  feel a symptom. You will hear a small click. Record Date, Time and  Symptom in the Patient Logbook.  When you are ready to remove the patch, follow instructions on the last 2 pages of Patient  Logbook. Stick patch monitor onto the last page of Patient Logbook.  Place Patient Logbook in the blue and white box. Use locking tab on box and tape box closed  securely. The blue and white box has prepaid postage on it. Please place it in the mailbox as  soon as possible. Your physician should have your test results approximately 7 days after the  monitor has been mailed back to [facility].  Call [facility] Customer Care at [phone] if you have questions regarding  your ZIO XT patch monitor. Call them immediately if you see an orange light blinking on your  monitor.  If your monitor falls off in less than 4 days, contact our Monitor department at [phone].  If your monitor becomes loose or falls off after 4 days call [employer] at [phone] for  suggestions on securing your monitor.     Signed, Tonny Bollman,  MD  05/10/2023 5:28 PM    [facility]

## 2023-05-10 NOTE — Patient Instructions (Addendum)
Medication Instructions:  Your physician recommends that you continue on your current medications as directed. Please refer to the Current Medication list given to you today.  *If you need a refill on your cardiac medications before your next appointment, please call your pharmacy*  Lab Work: None ordered today.  Testing/Procedures: Your physician has requested that you have a lexiscan myoview. For further information please visit [email or web address] Please follow instruction sheet, as given.   Your physician has requested that you wear a Zio heart monitor for 3 days. This will be mailed to your home with instructions on how to apply the monitor and how to return it when finished. Please allow 2 weeks after returning the heart monitor before our office calls you with the results.   Follow-Up: At [facility], you and your health needs are our priority.  As part of our continuing mission to provide you with exceptional heart care, we have created designated Provider Care Teams.  These Care Teams include your primary Cardiologist (physician) and Advanced Practice Providers (APPs -  Physician Assistants and Nurse Practitioners) who all work together to provide you with the care you need, when you need it.  Your next appointment:   6 month(s)  The format for your next appointment:   In Person  Provider:   Tonny Bollman, MD  or Jari Favre, PA-C, Robin Searing, NP, Eligha Bridegroom, NP, or Tereso Newcomer, PA-C     Other Instructions Steffanie Dunn (Stress Test) Instructions Please arrive 15 minutes prior to your appointment time for registration and insurance purposes.  The test will take approximately 3 to 4 hours to complete; you may bring reading material.  If someone comes with you to your appointment, they will need to remain in the main lobby due to limited space in the testing area. **If you are pregnant or breastfeeding, please notify the nuclear lab prior to your  appointment**  How to prepare for your Myocardial Perfusion Test: Do not eat or drink 3 hours prior to your test, except you may have water. Do not consume products containing caffeine (regular or decaffeinated) 12 hours prior to your test. (ex: coffee, chocolate, sodas, tea). Do bring a list of your current medications with you.  If not listed below, you may take your medications as normal. Do wear comfortable clothes (no dresses or overalls) and walking shoes, tennis shoes preferred (No heels or open toe shoes are allowed). Do NOT wear cologne, perfume, aftershave, or lotions (deodorant is allowed). If these instructions are not followed, your test will have to be rescheduled.  Please report to [address], [address] for your test.  If you have questions or concerns about your appointment, you can call the Nuclear Lab at [phone].  If you cannot keep your appointment, please provide 24 hours notification to the Nuclear Lab, to avoid a possible $50 charge to your account.   ZIO XT- Long Term Monitor Instructions     Your physician has requested you wear a ZIO patch monitor for 3 days.  This is a single patch monitor. [employer] supplies one patch monitor per enrollment. Additional  stickers are not available. Please do not apply patch if you will be having a Nuclear Stress Test,  Echocardiogram, Cardiac CT, MRI, or Chest Xray during the period you would be wearing the  monitor. The patch cannot be worn during these tests. You cannot remove and re-apply the  ZIO XT patch monitor.  Your ZIO patch monitor will be mailed 3  day USPS to your address on file. It may take 3-5 days  to receive your monitor after you have been enrolled.  Once you have received your monitor, please review the enclosed instructions. Your monitor  has already been registered assigning a specific monitor serial # to you.     Billing and Patient Assistance Program Information     We have supplied [employer]  with any of your insurance information on file for billing purposes.  [employer] offers a sliding scale Patient Assistance Program for patients that do not have  insurance, or whose insurance does not completely cover the cost of the ZIO monitor.  You must apply for the Patient Assistance Program to qualify for this discounted rate.  To apply, please call [employer] at [phone], select option 4, select option 2, ask to apply for  Patient Assistance Program. Meredeth Ide will ask your household income, and how many people  are in your household. They will quote your out-of-pocket cost based on that information.  [employer] will also be able to set up a 14-month, interest-free payment plan if needed.     Applying the monitor     Shave hair from upper left chest.  Hold abrader disc by orange tab. Rub abrader in 40 strokes over the upper left chest as  indicated in your monitor instructions.  Clean area with 4 enclosed alcohol pads. Let dry.  Apply patch as indicated in monitor instructions. Patch will be placed under collarbone on left  side of chest with arrow pointing upward.  Rub patch adhesive wings for 2 minutes. Remove white label marked "1". Remove the white  label marked "2". Rub patch adhesive wings for 2 additional minutes.  While looking in a mirror, press and release button in center of patch. A small green light will  flash 3-4 times. This will be your only indicator that the monitor has been turned on.  Do not shower for the first 24 hours. You may shower after the first 24 hours.  Press the button if you feel a symptom. You will hear a small click. Record Date, Time and  Symptom in the Patient Logbook.  When you are ready to remove the patch, follow instructions on the last 2 pages of Patient  Logbook. Stick patch monitor onto the last page of Patient Logbook.  Place Patient Logbook in the blue and white box. Use locking tab on box and tape box closed  securely. The blue and white box  has prepaid postage on it. Please place it in the mailbox as  soon as possible. Your physician should have your test results approximately 7 days after the  monitor has been mailed back to [facility].  Call [facility] Customer Care at [phone] if you have questions regarding  your ZIO XT patch monitor. Call them immediately if you see an orange light blinking on your  monitor.  If your monitor falls off in less than 4 days, contact our Monitor department at [phone].  If your monitor becomes loose or falls off after 4 days call [employer] at [phone] for  suggestions on securing your monitor.

## 2023-05-10 NOTE — Progress Notes (Unsigned)
Enrolled for [employer] to mail a ZIO XT long term holter monitor to the patients address on file.  

## 2023-05-15 DIAGNOSIS — R002 Palpitations: Secondary | ICD-10-CM

## 2023-05-18 ENCOUNTER — Telehealth (HOSPITAL_COMMUNITY): Payer: Self-pay | Admitting: *Deleted

## 2023-05-18 NOTE — Telephone Encounter (Signed)
Left detailed instructions for MPI study.

## 2023-05-20 ENCOUNTER — Other Ambulatory Visit: Payer: Self-pay | Admitting: Cardiovascular Disease

## 2023-05-20 ENCOUNTER — Ambulatory Visit (HOSPITAL_COMMUNITY): Payer: Medicare Other | Attending: Cardiovascular Disease

## 2023-05-20 DIAGNOSIS — I25119 Atherosclerotic heart disease of native coronary artery with unspecified angina pectoris: Secondary | ICD-10-CM | POA: Insufficient documentation

## 2023-05-20 LAB — MYOCARDIAL PERFUSION IMAGING
Base ST Depression (mm): 0 mm
LV dias vol: 49 mL (ref 46–106)
LV sys vol: 19 mL
Nuc Stress EF: 61 %
Peak HR: 87 {beats}/min
Rest HR: 72 {beats}/min
Rest Nuclear Isotope Dose: 10.7 mCi
SDS: 0
SRS: 0
SSS: 0
ST Depression (mm): 0 mm
Stress Nuclear Isotope Dose: 32.9 mCi
TID: 0.98

## 2023-05-20 MED ORDER — REGADENOSON 0.4 MG/5ML IV SOLN
0.4000 mg | Freq: Once | INTRAVENOUS | Status: AC
Start: 2023-05-20 — End: 2023-05-20
  Administered 2023-05-20: 0.4 mg via INTRAVENOUS

## 2023-05-20 MED ORDER — TECHNETIUM TC 99M TETROFOSMIN IV KIT
10.7000 | PACK | Freq: Once | INTRAVENOUS | Status: AC | PRN
  Administered 2023-05-20: 10.7 via INTRAVENOUS

## 2023-05-20 MED ORDER — TECHNETIUM TC 99M TETROFOSMIN IV KIT
32.9000 | PACK | Freq: Once | INTRAVENOUS | Status: AC | PRN
  Administered 2023-05-20: 32.9 via INTRAVENOUS

## 2023-05-21 ENCOUNTER — Telehealth: Payer: Self-pay | Admitting: Cardiovascular Disease

## 2023-05-21 DIAGNOSIS — M7062 Trochanteric bursitis, left hip: Secondary | ICD-10-CM | POA: Diagnosis not present

## 2023-05-21 NOTE — Telephone Encounter (Signed)
Returned call and completed in results encounter.

## 2023-05-21 NOTE — Telephone Encounter (Signed)
Pt called in for stress test results

## 2023-05-29 DIAGNOSIS — R002 Palpitations: Secondary | ICD-10-CM | POA: Diagnosis not present

## 2023-06-13 ENCOUNTER — Other Ambulatory Visit: Payer: Self-pay | Admitting: Cardiovascular Disease

## 2023-06-16 ENCOUNTER — Other Ambulatory Visit: Payer: Self-pay | Admitting: Internal Medicine

## 2023-06-25 ENCOUNTER — Other Ambulatory Visit: Payer: Self-pay | Admitting: Internal Medicine

## 2023-06-28 ENCOUNTER — Telehealth: Payer: Self-pay

## 2023-06-28 ENCOUNTER — Telehealth: Payer: Self-pay | Admitting: Internal Medicine

## 2023-06-28 NOTE — Telephone Encounter (Signed)
-----   Message from Tonny Bollman, MD sent at 06/27/2023  7:40 AM EDT ----- Reviewed. Frequent supraventricular beats noted. No atrial fibrillation. No sustained arrhythmias. Favor continued medical Rx (currently on metoprolol 100 mg BID). Could change amlodipine to diltiazem if palpitations/heart racing become more problematic/increasingly symptomatic. Please let me know if patient would like to change and we call in new Rx either now or in the future.

## 2023-06-28 NOTE — Telephone Encounter (Signed)
Carrie Chung [phone]  Arnita left a voice message on 06/23/2023 at 10:59 that she would like to come in the week of 06/28/2023 to talk to Dr Lenord Fellers about the medication she is on for depression.

## 2023-06-28 NOTE — Telephone Encounter (Signed)
Left her a message to call me back to schedule an appointment.

## 2023-06-28 NOTE — Telephone Encounter (Signed)
Called and spoke to patient about results. She does state that she would like to try Diltiazem instead of the Amlodipine since she feels the episodes when they occur. Routing to MD for dosing.

## 2023-06-29 MED ORDER — DILTIAZEM HCL ER COATED BEADS 120 MG PO CP24: 120.0000 mg | ORAL_CAPSULE | Freq: Every day | ORAL | 3 refills | Status: DC

## 2023-06-29 NOTE — Telephone Encounter (Signed)
Carrie Chung called back to say she does not need an appointment at this time. Dr Excell Seltzer change one of her medication after she wore a heart monitor for 3 days, and she does not want to change more than one medication at a time. She said she will call back late August or September.

## 2023-06-29 NOTE — Telephone Encounter (Signed)
Let's have her STOP amlodipine and START dilt CD 120 mg daily. thx

## 2023-06-29 NOTE — Telephone Encounter (Signed)
Diltiazem sent to pharmacy on file. Pt aware.

## 2023-08-19 ENCOUNTER — Telehealth: Payer: Self-pay | Admitting: Cardiovascular Disease

## 2023-08-19 NOTE — Telephone Encounter (Addendum)
Patient states she feel like her BP has been fluctuating and has been all over the place. Since she started diltiazem. BP  today 161/92 hr-58, two days 151/82 hr-63, on 8/12 172/92 hr - 72 after medication 149/82 hr 62. One night was 127/74 hr 66.   Patient currently asymptomatic. Did advise to continue to monitor BP and HR and start a BP log so we can send the trend. She will give [place] a call in two weeks with BP log. Discussed Ed precautions.

## 2023-08-19 NOTE — Telephone Encounter (Signed)
  Pt c/o BP issue: STAT if pt c/o blurred vision, one-sided weakness or slurred speech  1. What are your last 5 BP readings?  08/02/23 - BP 172/92 HR 72 After an hour and 15 mins - 149/82 62   2 days ago  151/82 63  Today 161/92 58  2. Are you having any other symptoms (ex. Dizziness, headache, blurred vision, passed out)? Sometimes night sweats, feeling fatigue   3. What is your BP issue? Pt said, her BP been elevated and fluctuating since she started taking her diltiazem. She said, it helps with her HR but her BP been all over the place

## 2023-08-20 MED ORDER — DILTIAZEM HCL ER COATED BEADS 240 MG PO CP24: 240.0000 mg | ORAL_CAPSULE | Freq: Every day | ORAL | 3 refills | Status: DC

## 2023-08-20 NOTE — Telephone Encounter (Signed)
Pt seen by Excell Seltzer on 05/10/23 who noted: Blood pressure has been controlled on current regimen. Recommend a 3-day ZIO monitor to better evaluate her tachycardia and symptoms of heart palpitations with elevated heart rate.  Monitor showed frequent SVT episodes and Dr Excell Seltzer started on Diltiazem CD 120mg  daily. Pt now reports that since this change BP has been trending high, usually in the 150's systolic. Verbal order given by Dr Excell Seltzer in clinic to increase Diltiazem CD to 240mg  daily. Called and spoke with patient who agrees to plan. New rx sent to pharmacy on file. Pt understands to continue monitoring BP and HR for the next week at roughly same time each day and alert [place] of any concerns.

## 2023-08-30 ENCOUNTER — Other Ambulatory Visit: Payer: Medicare Other

## 2023-08-30 DIAGNOSIS — I10 Essential (primary) hypertension: Secondary | ICD-10-CM | POA: Diagnosis not present

## 2023-08-30 DIAGNOSIS — E7849 Other hyperlipidemia: Secondary | ICD-10-CM

## 2023-08-30 DIAGNOSIS — F419 Anxiety disorder, unspecified: Secondary | ICD-10-CM | POA: Diagnosis not present

## 2023-08-30 DIAGNOSIS — Z0000 Encounter for general adult medical examination without abnormal findings: Secondary | ICD-10-CM

## 2023-08-30 DIAGNOSIS — R7302 Impaired glucose tolerance (oral): Secondary | ICD-10-CM

## 2023-08-30 DIAGNOSIS — Z7901 Long term (current) use of anticoagulants: Secondary | ICD-10-CM

## 2023-08-30 DIAGNOSIS — F32A Depression, unspecified: Secondary | ICD-10-CM | POA: Diagnosis not present

## 2023-08-30 DIAGNOSIS — M48062 Spinal stenosis, lumbar region with neurogenic claudication: Secondary | ICD-10-CM | POA: Diagnosis not present

## 2023-08-30 NOTE — Progress Notes (Signed)
Annual Wellness Visit    Patient Care Team: Rhaya Coale, Luanna Cole, MD as PCP - General (Internal Medicine) Tonny Bollman, MD as PCP - Cardiology (Cardiology) Lenord Fellers Luanna Cole, MD (Internal Medicine)  Visit Date: 09/06/23   Chief Complaint  Patient presents with   Medicare Wellness    Subjective:   Patient: Carrie Chung, Female    DOB: [birth year], 82 y.o.   MRN: [id number]  Carrie Chung is a 82 y.o. Female who presents today for her Annual Wellness Visit. History of atrial tachycardia, coronary artery disease, NSTEMI 03/2012, endometrial polyp, hypertension, hyperlipidemia, sleep related hypoxia.  She has been feeling fatigued and is not sleeping well. Her husband has an in-home nurse but she still has stress surrounding this situation. She has not been very active due to musculoskeletal pain. History of comminuted impacted acute left femoral intertrochanteric fracture. Status post femoral IM nail. Stopped PT in 2023 and is interested in restarting.   Reports that bruises on her arms are itching.   She would like an ENT referral for ear irrigation.  History of insomnia treated with alprazolam 2 mg at bedtime as needed.  History of impaired glucose tolerance. HGBA1c at 6.6% on 08/30/23, up from 6.0% one year ago. She believes she could improve her diet. She is eating chicken, fish, rice, pasta. She is not eating greens but plans to restart. Does not do very much cooking due to musculoskeletal pain.  History of coronary artery disease, palpitations, hyperlipidemia, hypertension treated with metoprolol tartrate 100 mg twice daily, hydrochlorothiazide 25 mg daily, rosuvastatin 20 mg daily, aspirin, clopidogrel. Followed by cardiologist, Dr. Tonny Bollman. Status post PTCA with drug-eluting stent x1 to the mid RCA in April 2013. She returned to the Cath Lab and had stents placed in the mid LAD and proximal circumflex arteries. TRIG elevated at 172.  Had some iron deficiency on  October 06 2021 labs. Iron level was 40. Hemoglobin decreased to a low of 8 g July 03, 2021. MCV was elevated in July 2022 at 101.2. CBC normal on 08/30/23.  History of GERD treated with esomeprazole 40 mg daily.   Quit smoking in 1982.  History of mild cognitive deficits followed by neurology. History of lumbar back pain. History of impaired glucose tolerance which is mild.   She sees Dr. Vassie Loll for use of nocturnal oxygen. Found to have oxygen desaturation in November 2020 to less than 88% for 1 hour 45 minutes during sleep. Normal PFTs in 2016. Has not been found to have significant sleep apnea previously so Dr. Felipa Furnace has elected not to pursue sleep study. She has mild emphysema as she is a former smoker but no airway obstruction on PFTs. Has about a 10-pack-year history of smoking and quit in 1982 according to Dr. Reginia Naas records   Husband not able to drive- has early dementia. She says he is stable.  Glucose elevated at 136. Kidney, liver functions normal. Electrolytes normal. CO2 elevated at 36. TSH at 2.85.  Denies GI symptoms. Has some urinary incontinence.   Had colonoscopy by Dr. Arlyce Dice 2005.  Social history: Husband is a retired Careers information officer. Patient has an adult daughter and 2 grandchildren that live here in [place]. Social alcohol consumption.    Past Medical History:  Diagnosis Date   Atrial tachycardia    a. noted at cardiac rehab 5/13; event monitor ordered to assess for AFib   CAD (coronary artery disease)    a. NSTEMI 03/2012 ([facility]HC 03/27/12: pLAD 30%, mLAD  99%, mCFX 95%, mRCA 99% with thrombus, EF 65%);  s/p PTCA/DESx1 to prox-mid RCA 03/27/12 urgently in setting of hypotension/bradycardia, with staged PTCA/Evolve study stent to mid LAD & PTCA/Evolve study stent to prox LCx 03/29/12 ;   echo 03/28/12:EF 60%, Aortic sclerosis without AS, mild RVE, mild reduced RVSF     Endometrial polyp    HTN (hypertension)    Hyperlipidemia    MI, old    Sleep related hypoxia       Family History  Problem Relation Age of Onset   Coronary artery disease Mother        CABG at age 110, PPM   Colon cancer Father    Cancer - Other Father        larnyx     Social History   Social History Narrative   Caffeine 2 cups tea.   Married. One adult daughter. 2 grandchildren. Husband is a retired Careers information officer. He has dementia and this has been stressful for the patient. They have been able to keep him at home with having home health staff come into the home.  Review of Systems  Constitutional:  Negative for chills, fever, malaise/fatigue and weight loss.  HENT:  Negative for hearing loss, sinus pain and sore throat.   Respiratory:  Negative for cough, hemoptysis and shortness of breath.   Cardiovascular:  Negative for chest pain, palpitations, leg swelling and PND.  Gastrointestinal:  Negative for abdominal pain, constipation, diarrhea, heartburn, nausea and vomiting.  Genitourinary:  Negative for dysuria, frequency and urgency.  Musculoskeletal:  Positive for joint pain. Negative for back pain, myalgias and neck pain.  Skin:  Positive for itching. Negative for rash.  Neurological:  Negative for dizziness, tingling, seizures and headaches.  Endo/Heme/Allergies:  Negative for polydipsia.  Psychiatric/Behavioral:  Negative for depression. The patient is not nervous/anxious.       Objective:   Vitals: BP 130/80   Pulse 74   Ht 5' (1.524 m)   Wt 161 lb (73 kg)   SpO2 96%   BMI 31.44 kg/m   Physical Exam Vitals and nursing note reviewed.  Constitutional:      General: She is not in acute distress.    Appearance: Normal appearance. She is not ill-appearing or toxic-appearing.  HENT:     Head: Normocephalic and atraumatic.     Right Ear: Hearing, tympanic membrane, ear canal and external ear normal.     Left Ear: Hearing, tympanic membrane, ear canal and external ear normal.     Ears:     Comments: Some cerumen bilateral external ear canals.     Mouth/Throat:     Pharynx: Oropharynx is clear.  Eyes:     Extraocular Movements: Extraocular movements intact.     Pupils: Pupils are equal, round, and reactive to light.  Neck:     Thyroid: No thyroid mass, thyromegaly or thyroid tenderness.     Vascular: No carotid bruit.  Cardiovascular:     Rate and Rhythm: Normal rate and regular rhythm. No extrasystoles are present.    Pulses:          Dorsalis pedis pulses are 1+ on the right side and 1+ on the left side.     Heart sounds: Normal heart sounds. No murmur heard.    No friction rub. No gallop.  Pulmonary:     Effort: Pulmonary effort is normal.     Breath sounds: Normal breath sounds. No decreased breath sounds, wheezing, rhonchi or rales.  Chest:  Chest wall: No mass.     Comments: Intertrigo under bilateral breasts. Abdominal:     Palpations: Abdomen is soft. There is no hepatomegaly, splenomegaly or mass.     Tenderness: There is no abdominal tenderness.     Hernia: No hernia is present.  Musculoskeletal:     Cervical back: Normal range of motion.     Right lower leg: No edema.     Left lower leg: No edema.  Lymphadenopathy:     Cervical: No cervical adenopathy.     Upper Body:     Right upper body: No supraclavicular adenopathy.     Left upper body: No supraclavicular adenopathy.  Skin:    General: Skin is warm and dry.  Neurological:     General: No focal deficit present.     Mental Status: She is alert and oriented to person, place, and time. Mental status is at baseline.     Sensory: Sensation is intact.     Motor: Motor function is intact. No weakness.     Deep Tendon Reflexes: Reflexes are normal and symmetric.  Psychiatric:        Attention and Perception: Attention normal.        Mood and Affect: Mood normal.        Speech: Speech normal.        Behavior: Behavior normal.        Thought Content: Thought content normal.        Cognition and Memory: Cognition normal.        Judgment: Judgment normal.       Most recent functional status assessment:    09/06/2023   10:58 AM  In your present state of health, do you have any difficulty performing the following activities:  Hearing? 0  Vision? 0  Difficulty concentrating or making decisions? 0  Walking or climbing stairs? 1  Dressing or bathing? 0  Doing errands, shopping? 0  Preparing Food and eating ? N  Using the Toilet? N  In the past six months, have you accidently leaked urine? Y  Do you have problems with loss of bowel control? N  Managing your Medications? N  Managing your Finances? N  Housekeeping or managing your Housekeeping? N   Most recent fall risk assessment:    09/06/2023   11:06 AM  Fall Risk   Falls in the past year? 0  Number falls in past yr: 0  Injury with Fall? 0  Risk for fall due to : No Fall Risks  Follow up Falls evaluation completed;Falls prevention discussed    Most recent depression screenings:    03/05/2023   11:32 AM 10/30/2022   12:33 PM  PHQ 2/9 Scores  PHQ - 2 Score 0 0   Most recent cognitive screening:    09/06/2023   11:01 AM  6CIT Screen  What Year? 0 points  What month? 0 points  What time? 0 points  Count back from 20 0 points  Months in reverse 0 points  Repeat phrase 0 points  Total Score 0 points     Results:   Studies obtained and personally reviewed by me:  Had colonoscopy by Dr. Arlyce Dice 2005.  Labs:       Component Value Date/Time   NA 143 08/30/2023 0926   NA 142 05/13/2022 1142   K 3.6 08/30/2023 0926   CL 98 08/30/2023 0926   CO2 36 (H) 08/30/2023 0926   GLUCOSE 136 (H) 08/30/2023 0926   BUN 13  08/30/2023 0926   BUN 11 05/13/2022 1142   CREATININE 0.78 08/30/2023 0926   CALCIUM 9.8 08/30/2023 0926   PROT 6.7 08/30/2023 0926   PROT 6.7 12/29/2017 1427   ALBUMIN 2.7 (L) 06/25/2021 0515   ALBUMIN 4.2 12/29/2017 1427   AST 14 08/30/2023 0926   ALT 10 08/30/2023 0926   ALKPHOS 70 06/25/2021 0515   BILITOT 0.4 08/30/2023 0926   BILITOT 0.3  12/29/2017 1427   GFRNONAA >60 07/10/2021 0626   GFRNONAA 68 08/16/2020 1156   GFRAA 79 08/16/2020 1156     Lab Results  Component Value Date   WBC 4.2 08/30/2023   HGB 13.3 08/30/2023   HCT 41.0 08/30/2023   MCV 90.3 08/30/2023   PLT 182 08/30/2023    Lab Results  Component Value Date   CHOL 182 08/30/2023   HDL 88 08/30/2023   LDLCALC 69 08/30/2023   LDLDIRECT 152.4 03/09/2012   TRIG 172 (H) 08/30/2023   CHOLHDL 2.1 08/30/2023    Lab Results  Component Value Date   HGBA1C 6.6 (H) 08/30/2023     Lab Results  Component Value Date   TSH 2.85 08/30/2023    Assessment & Plan:   Musculoskeletal pain: paper order given for PT.  Cerumen in bilateral external ear canals; hearing loss bilateral: referred to ENT for irrigation.  Intertrigo chest: prescribed Lotrisone under breasts daily.  Insomnia: stable with alprazolam 2 mg at bedtime as needed.   Impaired glucose tolerance: start metformin 500 mg daily with a meal. HGBA1c at 6.6% on 08/30/23, up from 6.0% one year ago. She will wok on controlling with healthy diet and exercise.  Hyperlipidemia: treated with rosuvastatin 20 mg daily. TRIG elevated at 172.  Hypertension, coronary artery disease, palpitations: treated with metoprolol tartrate 100 mg twice daily, hydrochlorothiazide 25 mg daily, aspirin, clopidogrel. Followed by cardiologist, Dr. Tonny Bollman.  GERD: treated with esomeprazole 40 mg daily.   Situational stress with husband who has memory disorder   History of sleep disorder-not obstructive sleep apnea apparently   History of mild cognitive deficits seen by neurology  Had colonoscopy by Dr. Arlyce Dice 2005.  Vaccine counseling: she will go to the pharmacy for high-dose flu, Covid-19, tetanus vaccines. Suggested one shot at a time.  Return in 6 months for HGBA1c and office visit.     Annual wellness visit done today including the all of the following: Reviewed patient's Family Medical  History Reviewed and updated list of patient's medical providers Assessment of cognitive impairment was done Assessed patient's functional ability Established a written schedule for health screening services Health Risk Assessent Completed and Reviewed  Discussed health benefits of physical activity, and encouraged her to engage in regular exercise appropriate for her age and condition.        I,Alexander Ruley,acting as a Neurosurgeon for Margaree Mackintosh, MD.,have documented all relevant documentation on the behalf of Margaree Mackintosh, MD,as directed by  Margaree Mackintosh, MD while in the presence of Margaree Mackintosh, MD.   I, Margaree Mackintosh, MD, have reviewed all documentation for this visit. The documentation on 09/15/23 for the exam, diagnosis, procedures, and orders are all accurate and complete.

## 2023-08-31 LAB — LIPID PANEL
Cholesterol: 182 mg/dL (ref <200)
HDL: 88 mg/dL (ref >=50)
LDL Cholesterol (Calc): 69 mg/dL
Non-HDL Cholesterol (Calc): 94 mg/dL (ref <130)
Total CHOL/HDL Ratio: 2.1 (calc) (ref <5.0)
Triglycerides: 172 mg/dL — ABNORMAL HIGH (ref <150)

## 2023-08-31 LAB — CBC WITH DIFFERENTIAL/PLATELET
Absolute Monocytes: 365 {cells}/uL (ref 200–950)
Basophils Absolute: 21 {cells}/uL (ref 0–200)
Basophils Relative: 0.5 %
Eosinophils Absolute: 88 {cells}/uL (ref 15–500)
Eosinophils Relative: 2.1 %
HCT: 41 % (ref 35.0–45.0)
Hemoglobin: 13.3 g/dL (ref 11.7–15.5)
Lymphs Abs: 1084 {cells}/uL (ref 850–3900)
MCH: 29.3 pg (ref 27.0–33.0)
MCHC: 32.4 g/dL (ref 32.0–36.0)
MCV: 90.3 fL (ref 80.0–100.0)
MPV: 10.3 fL (ref 7.5–12.5)
Monocytes Relative: 8.7 %
Neutro Abs: 2642 {cells}/uL (ref 1500–7800)
Neutrophils Relative %: 62.9 %
Platelets: 182 10*3/uL (ref 140–400)
RBC: 4.54 10*6/uL (ref 3.80–5.10)
RDW: 12.8 % (ref 11.0–15.0)
Total Lymphocyte: 25.8 %
WBC: 4.2 10*3/uL (ref 3.8–10.8)

## 2023-08-31 LAB — HEMOGLOBIN A1C
Hgb A1c MFr Bld: 6.6 %{Hb} — ABNORMAL HIGH (ref <5.7)
Mean Plasma Glucose: 143 mg/dL
eAG (mmol/L): 7.9 mmol/L

## 2023-08-31 LAB — COMPLETE METABOLIC PANEL WITH GFR
AG Ratio: 1.7 (calc) (ref 1.0–2.5)
ALT: 10 U/L (ref 6–29)
AST: 14 U/L (ref 10–35)
Albumin: 4.2 g/dL (ref 3.6–5.1)
Alkaline phosphatase (APISO): 70 U/L (ref 37–153)
BUN: 13 mg/dL (ref 7–25)
CO2: 36 mmol/L — ABNORMAL HIGH (ref 20–32)
Calcium: 9.8 mg/dL (ref 8.6–10.4)
Chloride: 98 mmol/L (ref 98–110)
Creat: 0.78 mg/dL (ref 0.60–0.95)
Globulin: 2.5 g/dL (ref 1.9–3.7)
Glucose, Bld: 136 mg/dL — ABNORMAL HIGH (ref 65–99)
Potassium: 3.6 mmol/L (ref 3.5–5.3)
Sodium: 143 mmol/L (ref 135–146)
Total Bilirubin: 0.4 mg/dL (ref 0.2–1.2)
Total Protein: 6.7 g/dL (ref 6.1–8.1)
eGFR: 76 mL/min/{1.73_m2} (ref >=60)

## 2023-08-31 LAB — TSH: TSH: 2.85 m[IU]/L (ref 0.40–4.50)

## 2023-09-04 ENCOUNTER — Other Ambulatory Visit: Payer: Self-pay | Admitting: Internal Medicine

## 2023-09-06 ENCOUNTER — Ambulatory Visit: Payer: Medicare Other

## 2023-09-06 ENCOUNTER — Encounter: Payer: Self-pay | Admitting: Internal Medicine

## 2023-09-06 ENCOUNTER — Ambulatory Visit: Payer: Medicare Other | Admitting: Internal Medicine

## 2023-09-06 VITALS — BP 130/80 | HR 74 | Ht 60.0 in | Wt 161.0 lb

## 2023-09-06 DIAGNOSIS — G4733 Obstructive sleep apnea (adult) (pediatric): Secondary | ICD-10-CM

## 2023-09-06 DIAGNOSIS — Z7901 Long term (current) use of anticoagulants: Secondary | ICD-10-CM

## 2023-09-06 DIAGNOSIS — G4709 Other insomnia: Secondary | ICD-10-CM | POA: Diagnosis not present

## 2023-09-06 DIAGNOSIS — M48062 Spinal stenosis, lumbar region with neurogenic claudication: Secondary | ICD-10-CM

## 2023-09-06 DIAGNOSIS — Z8679 Personal history of other diseases of the circulatory system: Secondary | ICD-10-CM

## 2023-09-06 DIAGNOSIS — E781 Pure hyperglyceridemia: Secondary | ICD-10-CM

## 2023-09-06 DIAGNOSIS — M7918 Myalgia, other site: Secondary | ICD-10-CM | POA: Diagnosis not present

## 2023-09-06 DIAGNOSIS — R7302 Impaired glucose tolerance (oral): Secondary | ICD-10-CM

## 2023-09-06 DIAGNOSIS — H6123 Impacted cerumen, bilateral: Secondary | ICD-10-CM

## 2023-09-06 DIAGNOSIS — F32A Depression, unspecified: Secondary | ICD-10-CM | POA: Diagnosis not present

## 2023-09-06 DIAGNOSIS — Z0000 Encounter for general adult medical examination without abnormal findings: Secondary | ICD-10-CM

## 2023-09-06 DIAGNOSIS — I10 Essential (primary) hypertension: Secondary | ICD-10-CM | POA: Diagnosis not present

## 2023-09-06 DIAGNOSIS — K219 Gastro-esophageal reflux disease without esophagitis: Secondary | ICD-10-CM

## 2023-09-06 DIAGNOSIS — B372 Candidiasis of skin and nail: Secondary | ICD-10-CM

## 2023-09-06 DIAGNOSIS — F439 Reaction to severe stress, unspecified: Secondary | ICD-10-CM | POA: Diagnosis not present

## 2023-09-06 DIAGNOSIS — E7849 Other hyperlipidemia: Secondary | ICD-10-CM

## 2023-09-06 DIAGNOSIS — Z955 Presence of coronary angioplasty implant and graft: Secondary | ICD-10-CM

## 2023-09-06 DIAGNOSIS — F419 Anxiety disorder, unspecified: Secondary | ICD-10-CM

## 2023-09-06 DIAGNOSIS — Z800 Family history of malignant neoplasm of digestive organs: Secondary | ICD-10-CM

## 2023-09-06 DIAGNOSIS — I252 Old myocardial infarction: Secondary | ICD-10-CM

## 2023-09-06 MED ORDER — CLOTRIMAZOLE-BETAMETHASONE 1-0.05 % EX CREA: 1.0000 | TOPICAL_CREAM | Freq: Every day | CUTANEOUS | 99 refills | Status: DC

## 2023-09-06 MED ORDER — METFORMIN HCL 500 MG PO TABS: ORAL_TABLET | ORAL | 1 refills | Status: DC

## 2023-09-15 NOTE — Patient Instructions (Addendum)
It was a pleasure to see you today.  Lotrisone cream has been prescribed for Candidiasis causing intertrigo under the breasts.  Continue counseling with Veto Kemps which is helpful for situational stress.  Labs are stable.  Please have Tdap vaccine done at pharmacy and annual high-dose flu vaccine.  Consider COVID booster.  Take metformin 500 mg daily for impaired glucose tolerance.  Referral to ENT for cerumen removal

## 2023-09-20 ENCOUNTER — Emergency Department (HOSPITAL_COMMUNITY): Payer: Medicare Other

## 2023-09-20 ENCOUNTER — Encounter (HOSPITAL_COMMUNITY): Payer: Self-pay

## 2023-09-20 ENCOUNTER — Inpatient Hospital Stay (HOSPITAL_COMMUNITY)
Admission: EM | Admit: 2023-09-20 | Discharge: 2023-09-28 | DRG: 481 | Disposition: A | Discharge status: Rehabilitation Facility | Payer: Medicare Other | Attending: Internal Medicine | Admitting: Internal Medicine

## 2023-09-20 ENCOUNTER — Other Ambulatory Visit: Payer: Self-pay

## 2023-09-20 DIAGNOSIS — S79911A Unspecified injury of right hip, initial encounter: Secondary | ICD-10-CM | POA: Diagnosis not present

## 2023-09-20 DIAGNOSIS — I4719 Other supraventricular tachycardia: Secondary | ICD-10-CM | POA: Diagnosis not present

## 2023-09-20 DIAGNOSIS — E611 Iron deficiency: Secondary | ICD-10-CM | POA: Diagnosis present

## 2023-09-20 DIAGNOSIS — I252 Old myocardial infarction: Secondary | ICD-10-CM | POA: Diagnosis not present

## 2023-09-20 DIAGNOSIS — F32A Depression, unspecified: Secondary | ICD-10-CM | POA: Diagnosis present

## 2023-09-20 DIAGNOSIS — J9611 Chronic respiratory failure with hypoxia: Secondary | ICD-10-CM | POA: Diagnosis not present

## 2023-09-20 DIAGNOSIS — S72001D Fracture of unspecified part of neck of right femur, subsequent encounter for closed fracture with routine healing: Secondary | ICD-10-CM | POA: Diagnosis not present

## 2023-09-20 DIAGNOSIS — Z9049 Acquired absence of other specified parts of digestive tract: Secondary | ICD-10-CM | POA: Diagnosis not present

## 2023-09-20 DIAGNOSIS — D696 Thrombocytopenia, unspecified: Secondary | ICD-10-CM | POA: Diagnosis not present

## 2023-09-20 DIAGNOSIS — Z9981 Dependence on supplemental oxygen: Secondary | ICD-10-CM | POA: Diagnosis not present

## 2023-09-20 DIAGNOSIS — Z882 Allergy status to sulfonamides status: Secondary | ICD-10-CM | POA: Diagnosis not present

## 2023-09-20 DIAGNOSIS — Z8249 Family history of ischemic heart disease and other diseases of the circulatory system: Secondary | ICD-10-CM | POA: Diagnosis not present

## 2023-09-20 DIAGNOSIS — I700 Atherosclerosis of aorta: Secondary | ICD-10-CM | POA: Diagnosis present

## 2023-09-20 DIAGNOSIS — E785 Hyperlipidemia, unspecified: Secondary | ICD-10-CM | POA: Diagnosis not present

## 2023-09-20 DIAGNOSIS — M19011 Primary osteoarthritis, right shoulder: Secondary | ICD-10-CM | POA: Diagnosis not present

## 2023-09-20 DIAGNOSIS — F419 Anxiety disorder, unspecified: Secondary | ICD-10-CM | POA: Diagnosis present

## 2023-09-20 DIAGNOSIS — Z0289 Encounter for other administrative examinations: Secondary | ICD-10-CM

## 2023-09-20 DIAGNOSIS — Z9089 Acquired absence of other organs: Secondary | ICD-10-CM

## 2023-09-20 DIAGNOSIS — Z7902 Long term (current) use of antithrombotics/antiplatelets: Secondary | ICD-10-CM

## 2023-09-20 DIAGNOSIS — E669 Obesity, unspecified: Secondary | ICD-10-CM | POA: Diagnosis present

## 2023-09-20 DIAGNOSIS — W108XXA Fall (on) (from) other stairs and steps, initial encounter: Secondary | ICD-10-CM | POA: Diagnosis present

## 2023-09-20 DIAGNOSIS — N390 Urinary tract infection, site not specified: Secondary | ICD-10-CM | POA: Diagnosis not present

## 2023-09-20 DIAGNOSIS — K219 Gastro-esophageal reflux disease without esophagitis: Secondary | ICD-10-CM | POA: Diagnosis present

## 2023-09-20 DIAGNOSIS — Z87891 Personal history of nicotine dependence: Secondary | ICD-10-CM

## 2023-09-20 DIAGNOSIS — Z8742 Personal history of other diseases of the female genital tract: Secondary | ICD-10-CM

## 2023-09-20 DIAGNOSIS — Z91011 Allergy to milk products: Secondary | ICD-10-CM

## 2023-09-20 DIAGNOSIS — D62 Acute posthemorrhagic anemia: Secondary | ICD-10-CM | POA: Diagnosis not present

## 2023-09-20 DIAGNOSIS — R9431 Abnormal electrocardiogram [ECG] [EKG]: Secondary | ICD-10-CM | POA: Diagnosis not present

## 2023-09-20 DIAGNOSIS — I10 Essential (primary) hypertension: Secondary | ICD-10-CM | POA: Diagnosis present

## 2023-09-20 DIAGNOSIS — S72141A Displaced intertrochanteric fracture of right femur, initial encounter for closed fracture: Secondary | ICD-10-CM | POA: Diagnosis not present

## 2023-09-20 DIAGNOSIS — E739 Lactose intolerance, unspecified: Secondary | ICD-10-CM | POA: Diagnosis not present

## 2023-09-20 DIAGNOSIS — Z8679 Personal history of other diseases of the circulatory system: Secondary | ICD-10-CM

## 2023-09-20 DIAGNOSIS — S72141D Displaced intertrochanteric fracture of right femur, subsequent encounter for closed fracture with routine healing: Secondary | ICD-10-CM | POA: Diagnosis not present

## 2023-09-20 DIAGNOSIS — Z6830 Body mass index (BMI) 30.0-30.9, adult: Secondary | ICD-10-CM

## 2023-09-20 DIAGNOSIS — S72001S Fracture of unspecified part of neck of right femur, sequela: Secondary | ICD-10-CM | POA: Diagnosis not present

## 2023-09-20 DIAGNOSIS — Z7982 Long term (current) use of aspirin: Secondary | ICD-10-CM | POA: Diagnosis not present

## 2023-09-20 DIAGNOSIS — Z9889 Other specified postprocedural states: Secondary | ICD-10-CM

## 2023-09-20 DIAGNOSIS — S72301A Unspecified fracture of shaft of right femur, initial encounter for closed fracture: Secondary | ICD-10-CM | POA: Diagnosis not present

## 2023-09-20 DIAGNOSIS — Z79899 Other long term (current) drug therapy: Secondary | ICD-10-CM | POA: Diagnosis not present

## 2023-09-20 DIAGNOSIS — Z955 Presence of coronary angioplasty implant and graft: Secondary | ICD-10-CM | POA: Diagnosis not present

## 2023-09-20 DIAGNOSIS — M160 Bilateral primary osteoarthritis of hip: Secondary | ICD-10-CM | POA: Diagnosis not present

## 2023-09-20 DIAGNOSIS — W010XXD Fall on same level from slipping, tripping and stumbling without subsequent striking against object, subsequent encounter: Secondary | ICD-10-CM | POA: Diagnosis present

## 2023-09-20 DIAGNOSIS — Z800 Family history of malignant neoplasm of digestive organs: Secondary | ICD-10-CM

## 2023-09-20 DIAGNOSIS — Z880 Allergy status to penicillin: Secondary | ICD-10-CM

## 2023-09-20 DIAGNOSIS — E119 Type 2 diabetes mellitus without complications: Secondary | ICD-10-CM | POA: Diagnosis present

## 2023-09-20 DIAGNOSIS — G4734 Idiopathic sleep related nonobstructive alveolar hypoventilation: Secondary | ICD-10-CM | POA: Diagnosis present

## 2023-09-20 DIAGNOSIS — E876 Hypokalemia: Secondary | ICD-10-CM | POA: Diagnosis not present

## 2023-09-20 DIAGNOSIS — G4733 Obstructive sleep apnea (adult) (pediatric): Secondary | ICD-10-CM | POA: Diagnosis present

## 2023-09-20 DIAGNOSIS — S72001A Fracture of unspecified part of neck of right femur, initial encounter for closed fracture: Principal | ICD-10-CM | POA: Diagnosis present

## 2023-09-20 DIAGNOSIS — I2510 Atherosclerotic heart disease of native coronary artery without angina pectoris: Secondary | ICD-10-CM | POA: Diagnosis present

## 2023-09-20 DIAGNOSIS — Y92099 Unspecified place in other non-institutional residence as the place of occurrence of the external cause: Secondary | ICD-10-CM

## 2023-09-20 DIAGNOSIS — Z801 Family history of malignant neoplasm of trachea, bronchus and lung: Secondary | ICD-10-CM

## 2023-09-20 DIAGNOSIS — M25511 Pain in right shoulder: Secondary | ICD-10-CM | POA: Diagnosis not present

## 2023-09-20 DIAGNOSIS — Z7984 Long term (current) use of oral hypoglycemic drugs: Secondary | ICD-10-CM

## 2023-09-20 DIAGNOSIS — R0902 Hypoxemia: Secondary | ICD-10-CM | POA: Diagnosis not present

## 2023-09-20 DIAGNOSIS — Z043 Encounter for examination and observation following other accident: Secondary | ICD-10-CM | POA: Diagnosis not present

## 2023-09-20 DIAGNOSIS — W19XXXA Unspecified fall, initial encounter: Secondary | ICD-10-CM | POA: Diagnosis not present

## 2023-09-20 DIAGNOSIS — Z98891 History of uterine scar from previous surgery: Secondary | ICD-10-CM

## 2023-09-20 DIAGNOSIS — M25551 Pain in right hip: Secondary | ICD-10-CM | POA: Diagnosis not present

## 2023-09-20 LAB — CBC WITH DIFFERENTIAL/PLATELET
Abs Immature Granulocytes: 0.02 10*3/uL (ref 0.00–0.07)
Basophils Absolute: 0 10*3/uL (ref 0.0–0.1)
Basophils Relative: 1 %
Eosinophils Absolute: 0.1 10*3/uL (ref 0.0–0.5)
Eosinophils Relative: 1 %
HCT: 42.1 % (ref 36.0–46.0)
Hemoglobin: 13.4 g/dL (ref 12.0–15.0)
Immature Granulocytes: 0 %
Lymphocytes Relative: 11 %
Lymphs Abs: 0.7 10*3/uL (ref 0.7–4.0)
MCH: 29.3 pg (ref 26.0–34.0)
MCHC: 31.8 g/dL (ref 30.0–36.0)
MCV: 91.9 fL (ref 80.0–100.0)
Monocytes Absolute: 0.5 10*3/uL (ref 0.1–1.0)
Monocytes Relative: 8 %
Neutro Abs: 5.1 10*3/uL (ref 1.7–7.7)
Neutrophils Relative %: 79 %
Platelets: 164 10*3/uL (ref 150–400)
RBC: 4.58 MIL/uL (ref 3.87–5.11)
RDW: 13.2 % (ref 11.5–15.5)
WBC: 6.5 10*3/uL (ref 4.0–10.5)
nRBC: 0 % (ref 0.0–0.2)

## 2023-09-20 LAB — BASIC METABOLIC PANEL
Anion gap: 12 (ref 5–15)
BUN: 9 mg/dL (ref 8–23)
CO2: 33 mmol/L — ABNORMAL HIGH (ref 22–32)
Calcium: 8.9 mg/dL (ref 8.9–10.3)
Chloride: 93 mmol/L — ABNORMAL LOW (ref 98–111)
Creatinine, Ser: 0.89 mg/dL (ref 0.44–1.00)
GFR, Estimated: >60 mL/min (ref >60)
Glucose, Bld: 186 mg/dL — ABNORMAL HIGH (ref 70–99)
Potassium: 2.6 mmol/L — CL (ref 3.5–5.1)
Sodium: 138 mmol/L (ref 135–145)

## 2023-09-20 LAB — TYPE AND SCREEN
ABO/RH(D): O POS
Antibody Screen: NEGATIVE

## 2023-09-20 MED ORDER — PANTOPRAZOLE SODIUM 40 MG PO TBEC
80.0000 mg | DELAYED_RELEASE_TABLET | Freq: Every day | ORAL | Status: DC
  Administered 2023-09-22 – 2023-09-27 (×6): 80 mg via ORAL
  Filled 2023-09-20 (×6): qty 2

## 2023-09-20 MED ORDER — FENTANYL CITRATE PF 50 MCG/ML IJ SOSY
50.0000 ug | PREFILLED_SYRINGE | INTRAMUSCULAR | Status: AC | PRN
  Administered 2023-09-20 (×2): 50 ug via INTRAVENOUS
  Filled 2023-09-20 (×2): qty 1

## 2023-09-20 MED ORDER — POTASSIUM CHLORIDE 10 MEQ/100ML IV SOLN
10.0000 meq | INTRAVENOUS | Status: AC
  Administered 2023-09-20 (×3): 10 meq via INTRAVENOUS
  Filled 2023-09-20 (×3): qty 100

## 2023-09-20 MED ORDER — ROSUVASTATIN CALCIUM 20 MG PO TABS
20.0000 mg | ORAL_TABLET | Freq: Every day | ORAL | Status: DC
  Administered 2023-09-22 – 2023-09-28 (×7): 20 mg via ORAL
  Filled 2023-09-20 (×7): qty 1

## 2023-09-20 MED ORDER — CALCIUM CARBONATE-VITAMIN D 600-400 MG-UNIT PO TABS: 1.0000 | ORAL_TABLET | Freq: Every evening | ORAL | Status: DC

## 2023-09-20 MED ORDER — ALPRAZOLAM 0.5 MG PO TABS
2.0000 mg | ORAL_TABLET | Freq: Every evening | ORAL | Status: DC | PRN
  Administered 2023-09-26 – 2023-09-27 (×2): 2 mg via ORAL
  Filled 2023-09-20 (×2): qty 4

## 2023-09-20 MED ORDER — DILTIAZEM HCL ER COATED BEADS 240 MG PO CP24
240.0000 mg | ORAL_CAPSULE | Freq: Every day | ORAL | Status: DC
  Administered 2023-09-21 – 2023-09-28 (×8): 240 mg via ORAL
  Filled 2023-09-20 (×8): qty 1

## 2023-09-20 MED ORDER — POTASSIUM CHLORIDE CRYS ER 20 MEQ PO TBCR
40.0000 meq | EXTENDED_RELEASE_TABLET | Freq: Once | ORAL | Status: AC
  Administered 2023-09-20: 40 meq via ORAL
  Filled 2023-09-20: qty 2

## 2023-09-20 MED ORDER — VENLAFAXINE HCL ER 37.5 MG PO CP24
75.0000 mg | ORAL_CAPSULE | Freq: Every day | ORAL | Status: DC
  Administered 2023-09-22 – 2023-09-28 (×7): 75 mg via ORAL
  Filled 2023-09-20 (×8): qty 2

## 2023-09-20 MED ORDER — HYDROCODONE-ACETAMINOPHEN 5-325 MG PO TABS
1.0000 | ORAL_TABLET | Freq: Four times a day (QID) | ORAL | Status: DC | PRN
  Administered 2023-09-20 – 2023-09-21 (×2): 2 via ORAL
  Filled 2023-09-20 (×2): qty 2

## 2023-09-20 MED ORDER — HYDROMORPHONE HCL 1 MG/ML IJ SOLN
0.5000 mg | INTRAMUSCULAR | Status: DC | PRN
  Administered 2023-09-20 – 2023-09-21 (×4): 0.5 mg via INTRAVENOUS
  Filled 2023-09-20 (×2): qty 0.5
  Filled 2023-09-20: qty 1
  Filled 2023-09-20: qty 0.5

## 2023-09-20 MED ORDER — METOPROLOL TARTRATE 50 MG PO TABS
100.0000 mg | ORAL_TABLET | Freq: Two times a day (BID) | ORAL | Status: DC
  Administered 2023-09-20 – 2023-09-28 (×15): 100 mg via ORAL
  Filled 2023-09-20 (×16): qty 2

## 2023-09-20 NOTE — ED Triage Notes (Signed)
Pt BIB [facility] from home, pt had a mechanical fall where she fell and trip, on Plavix, no LOC with fall. Per EMS, pt has shortening of right leg, right shoulder pain with no tenderness and swelling noted on right knee and thigh.

## 2023-09-20 NOTE — Assessment & Plan Note (Addendum)
On 2L O2 at bedtime at baseline due to sleep related hypoxia (see pulm notes from Feb) Ordered O2 at bedtime.

## 2023-09-20 NOTE — Assessment & Plan Note (Addendum)
H/o NSTEMI and multiple stents back in 2013. Cont metoprolol, cardizem, crestor. Holding ASA / Plavix for surgery.

## 2023-09-20 NOTE — ED Notes (Signed)
[facility] called for transport no eta

## 2023-09-20 NOTE — ED Notes (Signed)
ED TO INPATIENT HANDOFF REPORT  ED Nurse Name and Phone #: Darral Dash RN [phone]  S Name/Age/Gender Carrie Chung 82 y.o. female Room/Bed: 025C/025C  Code Status   Code Status: Full Code  Home/SNF/Other Home Patient oriented to: self, place, time, and situation Is this baseline? Yes   Triage Complete: Triage complete  Chief Complaint Closed right hip fracture, initial encounter (HCC) [S72.001A]  Triage Note Pt BIB [facility] from home, pt had a mechanical fall where she fell and trip, on Plavix, no LOC with fall. Per EMS, pt has shortening of right leg, right shoulder pain with no tenderness and swelling noted on right knee and thigh.    Allergies Allergies  Allergen Reactions   Penicillins Hives, Swelling and Other (See Comments)   Sulfonamide Derivatives Hives and Swelling   Lactose Intolerance (Gi) Diarrhea   Sulfamethoxazole Swelling    Level of Care/Admitting Diagnosis ED Disposition     ED Disposition  Admit   Condition  --   Comment  Hospital Area: [facility] [[id number]]  Level of Care: Med-Surg [16]  May admit patient to [facility] or [facility] if equivalent level of care is available:: No  Covid Evaluation: Asymptomatic - no recent exposure (last 10 days) testing not required  Diagnosis: Closed right hip fracture, initial encounter [facility]) [[id number]]  Admitting Physician: Wyvonnia Dusky  Attending Physician: Hillary Bow [[id number]]  Bed request comments: To [facility] per Dr. Eulah Pont request (anesthesia / OR availbility I assume)  Certification:: I certify this patient is being admitted for an inpatient-only procedure  Expected Medical Readiness: 09/25/2023          B Medical/Surgery History Past Medical History:  Diagnosis Date   Atrial tachycardia    a. noted at cardiac rehab 5/13; event monitor ordered to assess for AFib   CAD (coronary artery disease)    a. NSTEMI 03/2012 (LHC 03/27/12: pLAD 30%, mLAD 99%, mCFX 95%, mRCA  99% with thrombus, EF 65%);  s/p PTCA/DESx1 to prox-mid RCA 03/27/12 urgently in setting of hypotension/bradycardia, with staged PTCA/Evolve study stent to mid LAD & PTCA/Evolve study stent to prox LCx 03/29/12 ;   echo 03/28/12:EF 60%, Aortic sclerosis without AS, mild RVE, mild reduced RVSF     Endometrial polyp    HTN (hypertension)    Hyperlipidemia    MI, old    Sleep related hypoxia    Past Surgical History:  Procedure Laterality Date   APPENDECTOMY  82 years old   CAROTID STENT  04/2012   x3   CESAREAN SECTION  1984   CHOLECYSTECTOMY  1978   INTRAMEDULLARY (IM) NAIL INTERTROCHANTERIC Left 06/20/2021   Procedure: INTRAMEDULLARY (IM) NAIL INTERTROCHANTRIC;  Surgeon: Sheral Apley, MD;  Location: [facility] OR;  Service: Orthopedics;  Laterality: Left;   LEFT HEART CATHETERIZATION WITH CORONARY ANGIOGRAM N/A 03/27/2012   Procedure: LEFT HEART CATHETERIZATION WITH CORONARY ANGIOGRAM;  Surgeon: Kathleene Hazel, MD;  Location: [facility] CATH LAB;  Service: Cardiovascular;  Laterality: N/A;   PERCUTANEOUS CORONARY STENT INTERVENTION (PCI-S) N/A 03/27/2012   Procedure: PERCUTANEOUS CORONARY STENT INTERVENTION (PCI-S);  Surgeon: Kathleene Hazel, MD;  Location: [facility] CATH LAB;  Service: Cardiovascular;  Laterality: N/A;   PERCUTANEOUS CORONARY STENT INTERVENTION (PCI-S) N/A 03/29/2012   Procedure: PERCUTANEOUS CORONARY STENT INTERVENTION (PCI-S);  Surgeon: Tonny Bollman, MD;  Location: [facility] CATH LAB;  Service: Cardiovascular;  Laterality: N/A;   TONSILLECTOMY  82 years old     A IV Location/Drains/Wounds Patient Lines/Drains/Airways Status  Active Line/Drains/Airways     Name Placement date Placement time Site Days   Peripheral IV 09/20/23 20 G 1" Right Antecubital 09/20/23  1926  Antecubital  less than 1   External Urinary Catheter 09/20/23  2051  --  less than 1   Incision (Closed) 06/20/21 Hip Left 06/20/21  1444  -- 822   Incision (Closed) 06/24/21 Thigh Left;Lateral 06/24/21  1643  -- 818             Intake/Output Last 24 hours No intake or output data in the 24 hours ending 09/20/23 2145  Labs/Imaging Results for orders placed or performed during the hospital encounter of 09/20/23 (from the past 48 hour(s))  Basic metabolic panel     Status: Abnormal   Collection Time: 09/20/23  7:12 PM  Result Value Ref Range   Sodium 138 135 - 145 mmol/L   Potassium 2.6 (LL) 3.5 - 5.1 mmol/L    Comment: CRITICAL RESULT CALLED TO, READ BACK BY AND VERIFIED WITH E Shravan Salahuddin,RN 2003 09/20/2023 WBOND   Chloride 93 (L) 98 - 111 mmol/L   CO2 33 (H) 22 - 32 mmol/L   Glucose, Bld 186 (H) 70 - 99 mg/dL    Comment: Glucose reference range applies only to samples taken after fasting for at least 8 hours.   BUN 9 8 - 23 mg/dL   Creatinine, Ser 7.82 0.44 - 1.00 mg/dL   Calcium 8.9 8.9 - 95.6 mg/dL   GFR, Estimated >21 >30 mL/min    Comment: (NOTE) Calculated using the CKD-EPI Creatinine Equation (2021)    Anion gap 12 5 - 15    Comment: Performed at [facility], [address] [place], [place] [address]  CBC with Differential     Status: None   Collection Time: 09/20/23  7:12 PM  Result Value Ref Range   WBC 6.5 4.0 - 10.5 K/uL   RBC 4.58 3.87 - 5.11 MIL/uL   Hemoglobin 13.4 12.0 - 15.0 g/dL   HCT 46.9 62.9 - 52.8 %   MCV 91.9 80.0 - 100.0 fL   MCH 29.3 26.0 - 34.0 pg   MCHC 31.8 30.0 - 36.0 g/dL   RDW 41.3 24.4 - 01.0 %   Platelets 164 150 - 400 K/uL   nRBC 0.0 0.0 - 0.2 %   Neutrophils Relative % 79 %   Neutro Abs 5.1 1.7 - 7.7 K/uL   Lymphocytes Relative 11 %   Lymphs Abs 0.7 0.7 - 4.0 K/uL   Monocytes Relative 8 %   Monocytes Absolute 0.5 0.1 - 1.0 K/uL   Eosinophils Relative 1 %   Eosinophils Absolute 0.1 0.0 - 0.5 K/uL   Basophils Relative 1 %   Basophils Absolute 0.0 0.0 - 0.1 K/uL   Immature Granulocytes 0 %   Abs Immature Granulocytes 0.02 0.00 - 0.07 K/uL    Comment: Performed at [facility], [address] [place], [place] [address]  Type and  screen [facility]     Status: None   Collection Time: 09/20/23  7:12 PM  Result Value Ref Range   ABO/RH(D) O POS    Antibody Screen NEG    Sample Expiration      09/23/2023,2359 Performed at [facility], [address], [place], [place] [address]    *Note: Due to a large number of results and/or encounters for the requested time period, some results have not been displayed. A complete set of results can  be found in Results Review.   DG Shoulder Right Port  Result Date: 09/20/2023 CLINICAL DATA:  Fall pain EXAM: RIGHT SHOULDER - 2 VIEW COMPARISON:  None Available. FINDINGS: There is no evidence of fracture or dislocation. Acromioclavicular degenerative change with narrowing and osteophyte formation. IMPRESSION: Acromioclavicular degenerative changes. No acute osseous abnormalities. Electronically Signed   By: Layla Maw M.D.   On: 09/20/2023 19:38   DG Hip Unilat With Pelvis 2-3 Views Right  Result Date: 09/20/2023 CLINICAL DATA:  Fall pain EXAM: DG HIP (WITH OR WITHOUT PELVIS) 3V RIGHT COMPARISON:  None Available. FINDINGS: Comminuted intertrochanteric fracture proximal right femur. Lesser trochanter fragment displaced by up to 1.5 cm. Bilateral hip osteoarthritis with joint space narrowing and osteophytes. Prior left hip ORIF. No osteolytic or osteoblastic changes. Pelvic ring intact. IMPRESSION: Comminuted displaced right proximal femoral intertrochanteric fracture. Electronically Signed   By: Layla Maw M.D.   On: 09/20/2023 19:38   DG Chest Port 1 View  Result Date: 09/20/2023 CLINICAL DATA:  Fall. EXAM: PORTABLE CHEST 1 VIEW COMPARISON:  07/10/2021. FINDINGS: Unremarkable cardiac silhouette. Calcified aorta. No pneumothorax or pleural effusion. Osseous structures appear grossly intact and are not well evaluated with chest x-ray. Unremarkable pulmonary vasculature. Vague left basilar opacity could represent an early infectious process versus chronic  scarring. IMPRESSION: Vague/early process at the left base consistent with edema, volume loss or scarring. Otherwise no acute cardiopulmonary process. Electronically Signed   By: Layla Maw M.D.   On: 09/20/2023 19:36    Pending Labs Unresulted Labs (From admission, onward)     Start     Ordered   09/21/23 0500  Basic metabolic panel  Tomorrow morning,   R        09/20/23 2014   09/21/23 0500  CBC  Tomorrow morning,   R        09/20/23 2031            Vitals/Pain Today's Vitals   09/20/23 2045 09/20/23 2054 09/20/23 2100 09/20/23 2134  BP: 118/63  (!) 142/64   Pulse: 74  73   Resp: 14  16   Temp:      TempSrc:      SpO2: 98%  98%   Weight:      Height:      PainSc:  7   7     Isolation Precautions No active isolations  Medications Medications  potassium chloride 10 mEq in 100 mL IVPB (0 mEq Intravenous Stopped 09/20/23 2134)  HYDROcodone-acetaminophen (NORCO/VICODIN) 5-325 MG per tablet 1-2 tablet (has no administration in time range)  HYDROmorphone (DILAUDID) injection 0.5 mg (has no administration in time range)  ALPRAZolam (XANAX) tablet 2 mg (has no administration in time range)  diltiazem (CARDIZEM CD) 24 hr capsule 240 mg (has no administration in time range)  venlafaxine XR (EFFEXOR-XR) 24 hr capsule 75 mg (has no administration in time range)  metoprolol tartrate (LOPRESSOR) tablet 100 mg (has no administration in time range)  pantoprazole (PROTONIX) EC tablet 80 mg (has no administration in time range)  rosuvastatin (CRESTOR) tablet 20 mg (has no administration in time range)  fentaNYL (SUBLIMAZE) injection 50 mcg (50 mcg Intravenous Given 09/20/23 2058)  potassium chloride SA (KLOR-CON M) CR tablet 40 mEq (40 mEq Oral Given 09/20/23 2018)    Mobility walks     Focused Assessments Hip fracture    R Recommendations: See Admitting Provider Note  Report given to:   Additional Notes: Pts daughter is at bedside with her,  pt is A/Ox4, VSS, on 2L [place],  baseline wears 2L at home prn for sleep hypoxia

## 2023-09-20 NOTE — Assessment & Plan Note (Signed)
Replace K Repeat BMP in AM

## 2023-09-20 NOTE — ED Provider Notes (Signed)
[place] EMERGENCY DEPARTMENT AT [facility] Provider Note   CSN: [id number] Arrival date & time: 09/20/23  1842     History  Chief Complaint  Patient presents with   Fall    Carrie Chung is a 82 y.o. female who presents emergency department chief complaint of right hip and shoulder injury.  She had a mechanical fall at home tripping up over stairs.  She fell directly onto her right side.  She was unable to get up.  EMS reports shortening and external rotation of the right leg.  Patient states that she is "quite sure I have a femur fracture."  Has a history of the same on the left previously repaired by Dr. Eulah Pont.  He denies numbness or tingling.  She was given pain medication prior to arrival.  She complains of right shoulder pain.  She did not hit her head or lose consciousness.  She is on Plavix.   Fall      Home Medications Prior to Admission medications   Medication Sig Start Date End Date Taking? Authorizing Provider  acetaminophen (TYLENOL) 325 MG tablet Take 1-2 tablets (325-650 mg total) by mouth every 4 (four) hours as needed for mild pain. 07/10/21   Angiulli, Mcarthur Rossetti, PA-C  alprazolam Prudy Feeler) 2 MG tablet TAKE 1 TABLET(2 MG) BY MOUTH AT BEDTIME AS NEEDED FOR SLEEP 06/16/23   Margaree Mackintosh, MD  aspirin 81 MG tablet Take 1 tablet (81 mg total) by mouth daily. 03/30/12   Dunn, Tacey Ruiz, PA-C  Calcium Carbonate-Vitamin D 600-400 MG-UNIT tablet Take 1 tablet by mouth every evening. 07/10/21   Angiulli, Mcarthur Rossetti, PA-C  clopidogrel (PLAVIX) 75 MG tablet TAKE 1 TABLET(75 MG) BY MOUTH DAILY 06/15/23   Tonny Bollman, MD  clotrimazole-betamethasone (LOTRISONE) cream Apply 1 Application topically daily. 09/06/23   Margaree Mackintosh, MD  desvenlafaxine (PRISTIQ) 50 MG 24 hr tablet TAKE 1 TABLET(50 MG) BY MOUTH DAILY 06/25/23   Worthy Rancher B, FNP  diltiazem (CARDIZEM CD) 240 MG 24 hr capsule Take 1 capsule (240 mg total) by mouth daily. 08/20/23   Tonny Bollman, MD   esomeprazole (NEXIUM) 40 MG capsule Take 1 capsule (40 mg total) by mouth daily at 12 noon. 07/10/21   Angiulli, Mcarthur Rossetti, PA-C  hydrochlorothiazide (HYDRODIURIL) 25 MG tablet Take 1 tablet (25 mg total) by mouth daily. 05/20/23   Tonny Bollman, MD  metFORMIN (GLUCOPHAGE) 500 MG tablet One tab by mouth daily with a meal 09/06/23   Margaree Mackintosh, MD  metoprolol tartrate (LOPRESSOR) 100 MG tablet Take 1 tablet (100 mg total) by mouth 2 (two) times daily. 11/02/22   Tonny Bollman, MD  nitroGLYCERIN (NITROSTAT) 0.4 MG SL tablet Place 1 tablet (0.4 mg total) under the tongue every 5 (five) minutes as needed for chest pain. 06/03/22   Tonny Bollman, MD  OXYGEN Inhale into the lungs. Per patient using 2 liters at night.    [provider]  prochlorperazine (COMPAZINE) 10 MG tablet Take 1 tablet (10 mg total) by mouth every 8 (eight) hours as needed for nausea or vomiting. 11/25/21   Margaree Mackintosh, MD  rosuvastatin (CRESTOR) 20 MG tablet TAKE 1 TABLET(20 MG) BY MOUTH DAILY 06/15/23   Tonny Bollman, MD      Allergies    Penicillins, Sulfonamide derivatives, Lactose intolerance (gi), and Sulfamethoxazole    Review of Systems   Review of Systems  Physical Exam Updated Vital Signs There were no vitals taken for this visit.  Physical Exam Vitals and nursing note reviewed.  Constitutional:      General: She is not in acute distress.    Appearance: She is well-developed. She is not diaphoretic.  HENT:     Head: Normocephalic and atraumatic.     Right Ear: External ear normal.     Left Ear: External ear normal.     Nose: Nose normal.     Mouth/Throat:     Mouth: Mucous membranes are moist.  Eyes:     General: No scleral icterus.    Extraocular Movements: Extraocular movements intact.     Conjunctiva/sclera: Conjunctivae normal.     Pupils: Pupils are equal, round, and reactive to light.  Cardiovascular:     Rate and Rhythm: Normal rate and regular rhythm.     Heart sounds: Normal  heart sounds. No murmur heard.    No friction rub. No gallop.  Pulmonary:     Effort: Pulmonary effort is normal. No respiratory distress.     Breath sounds: Normal breath sounds.  Abdominal:     General: Bowel sounds are normal. There is no distension.     Palpations: Abdomen is soft. There is no mass.     Tenderness: There is no abdominal tenderness. There is no guarding.  Musculoskeletal:     Cervical back: Normal range of motion.     Comments: No obvious tenderness or deformity of the right shoulder or arm.  Normal grip strength and radial pulse.  Right leg is shortened and externally rotated.  She is tender to palpation of the trochanter.  She is neurovascularly intact.  Skin:    General: Skin is warm and dry.  Neurological:     Mental Status: She is alert and oriented to person, place, and time.  Psychiatric:        Behavior: Behavior normal.    ED Results / Procedures / Treatments   Labs (all labs ordered are listed, but only abnormal results are displayed) Labs Reviewed - No data to display  EKG None  Radiology No results found.  Procedures .Critical Care  Performed by: Arthor Captain, PA-C Authorized by: Arthor Captain, PA-C   Critical care provider statement:    Critical care time (minutes):  30   Critical care was necessary to treat or prevent imminent or life-threatening deterioration of the following conditions:  Metabolic crisis (profound hypokalemia)   Critical care was time spent personally by me on the following activities:  Development of treatment plan with patient or surrogate, discussions with consultants, evaluation of patient's response to treatment, examination of patient, ordering and review of laboratory studies, ordering and review of radiographic studies, ordering and performing treatments and interventions, pulse oximetry, re-evaluation of patient's condition and review of old charts   Care discussed with: admitting provider       Medications  Ordered in ED Medications - No data to display  ED Course/ Medical Decision Making/ A&P Clinical Course as of 09/21/23 1534  Mon Sep 20, 2023  6815 82 year old female here with a trip and fall injuring her right shoulder right hip.  No loss consciousness.  Complaining of inability to range of motion that hip.  Getting x-rays and labs.  Disposition per results of testing. [MB]  1930 X-ray showing inner trochanter fracture in the right hip.  Will need admission and Ortho input. [MB]    Clinical Course User Index [MB] Terrilee Files, MD  Medical Decision Making Amount and/or Complexity of Data Reviewed Labs: ordered. Radiology: ordered.  Risk Prescription drug management. Decision regarding hospitalization.    This patient presents to the ED for concern of fall. And hip pain , this involves an extensive number of treatment options, and is a complaint that carries with it a high risk of complications and morbidity.  Differential diagnosis includes syncope, disequilibrium, traumatic injuries include hip fracture, pelvic fracture, back injury.  Per patient report there was no loss of consciousness, no evidence of syncope.  No report of head injury  Co morbidities:   has a past medical history of Atrial tachycardia, CAD (coronary artery disease), Endometrial polyp, HTN (hypertension), Hyperlipidemia, MI, old, and Sleep related hypoxia.   Social Determinants of Health:   SDOH Screenings   Food Insecurity: No Food Insecurity (09/20/2023)  Housing: Low Risk  (09/20/2023)  Transportation Needs: No Transportation Needs (09/20/2023)  Utilities: Not At Risk (09/20/2023)  Alcohol Screen: Low Risk  (09/03/2021)  Depression (PHQ2-9): Low Risk  (03/05/2023)  Financial Resource Strain: Low Risk  (09/03/2021)  Physical Activity: Inactive (09/03/2021)  Social Connections: Socially Integrated (09/03/2021)  Stress: No Stress Concern Present (09/03/2021)  Tobacco Use:  Medium Risk (09/20/2023)  Health Literacy: Adequate Health Literacy (09/06/2023)     Additional history:  {Additional history obtained from EMS at bedside   Lab Tests:  I Ordered, and personally interpreted labs.  The pertinent results include:   BMP shows profound hypokalemia.  Patient reports he has a history of the same.  CBC without abnormality.   Imaging Studies:  I ordered imaging studies including chest x-ray, right shoulder x-ray, and right hip x-ray I independently visualized and interpreted imaging which showed comminuted displaced intertrochanteric fracture.  Otherwise no acute abnormalities noted I agree with the radiologist interpretation  Cardiac Monitoring/ECG:  The patient was maintained on a cardiac monitor.  I personally viewed and interpreted the cardiac monitored which showed an underlying rhythm of: Sinus rhythm  Medicines ordered and prescription drug management:  I ordered medication including  Fentanyl, IV potassium 10 mEq /h for 3 runs, p.o. potassium 40 mEq p.o., Zofran for pain and hypokalemia Reevaluation of the patient after these medicines showed that the patient improved I have reviewed the patients home medicines and have made adjustments as needed  Test Considered:   I considered CT head and C-spine however patient has no signs or symptoms of head injury  Critical Interventions:  potassium  Consultations Obtained: Dr. Renaye Rakers for Ortho Dr. Lyda Perone for admission  Problem List / ED Course:     ICD-10-CM   1. Closed fracture of right hip, initial encounter (HCC)  S72.001A       MDM: Patient with mechanical fall, right hip fracture, incidental finding of profound hypokalemia.  Patient will be admitted for further management   Dispostion:  After consideration of the diagnostic results and the patients response to treatment, I feel that the patent would benefit from admit.         Final Clinical Impression(s) / ED  Diagnoses Final diagnoses:  Closed fracture of right hip, initial encounter [facility])    Rx / DC Orders ED Discharge Orders     None         Arthor Captain, PA-C 09/23/23 1810    Terrilee Files, MD 09/24/23 1040

## 2023-09-20 NOTE — ED Notes (Signed)
Attempted to call [facility] x2 no answer.

## 2023-09-20 NOTE — Assessment & Plan Note (Addendum)
Hip fx pathway NPO after MN Dr. Renaye Rakers planning on fixing hip: asks that pt be admitted to [facility] (Anesthesia / OR availability I assume). Pain control with opiates (including IV) per pathway Will change her Xanax at bedtime to 1-2mg  PRN instead of just 2mg  PRN to try and prevent oversedation. GUPTA score = 0.9% Note: Pt specifically requests to come back to [facility] for rehab when its time for CIR (had good experience with 4th floor rehab when she broke her other hip she says).

## 2023-09-20 NOTE — H&P (Signed)
History and Physical    Patient: Carrie Chung GLO:[id number] DOB: 1941/01/24 DOA: 09/20/2023 DOS: the patient was seen and examined on 09/20/2023 PCP: Margaree Mackintosh, MD  Patient coming from: Home  Chief Complaint:  Chief Complaint  Patient presents with   Fall   HPI: Carrie Chung is a 82 y.o. female with medical history significant of CAD, NSTEMI s/p stents, HTN, HLD.  Pt in to ED with R hip pain after mechanical fall at home.  Pain severe, unable to bear wt.  Also has R shoulder pain.  Denies head trauma, denies LOC.  Fall was due to tripping up over stairs.  Review of Systems: As mentioned in the history of present illness. All other systems reviewed and are negative. Past Medical History:  Diagnosis Date   Atrial tachycardia    a. noted at cardiac rehab 5/13; event monitor ordered to assess for AFib   CAD (coronary artery disease)    a. NSTEMI 03/2012 (LHC 03/27/12: pLAD 30%, mLAD 99%, mCFX 95%, mRCA 99% with thrombus, EF 65%);  s/p PTCA/DESx1 to prox-mid RCA 03/27/12 urgently in setting of hypotension/bradycardia, with staged PTCA/Evolve study stent to mid LAD & PTCA/Evolve study stent to prox LCx 03/29/12 ;   echo 03/28/12:EF 60%, Aortic sclerosis without AS, mild RVE, mild reduced RVSF     Endometrial polyp    HTN (hypertension)    Hyperlipidemia    MI, old    Sleep related hypoxia    Past Surgical History:  Procedure Laterality Date   APPENDECTOMY  82 years old   CAROTID STENT  04/2012   x3   CESAREAN SECTION  1984   CHOLECYSTECTOMY  1978   INTRAMEDULLARY (IM) NAIL INTERTROCHANTERIC Left 06/20/2021   Procedure: INTRAMEDULLARY (IM) NAIL INTERTROCHANTRIC;  Surgeon: Sheral Apley, MD;  Location: [facility] OR;  Service: Orthopedics;  Laterality: Left;   LEFT HEART CATHETERIZATION WITH CORONARY ANGIOGRAM N/A 03/27/2012   Procedure: LEFT HEART CATHETERIZATION WITH CORONARY ANGIOGRAM;  Surgeon: Kathleene Hazel, MD;  Location: [facility] CATH LAB;  Service: Cardiovascular;   Laterality: N/A;   PERCUTANEOUS CORONARY STENT INTERVENTION (PCI-S) N/A 03/27/2012   Procedure: PERCUTANEOUS CORONARY STENT INTERVENTION (PCI-S);  Surgeon: Kathleene Hazel, MD;  Location: [facility] CATH LAB;  Service: Cardiovascular;  Laterality: N/A;   PERCUTANEOUS CORONARY STENT INTERVENTION (PCI-S) N/A 03/29/2012   Procedure: PERCUTANEOUS CORONARY STENT INTERVENTION (PCI-S);  Surgeon: Tonny Bollman, MD;  Location: [facility] CATH LAB;  Service: Cardiovascular;  Laterality: N/A;   TONSILLECTOMY  82 years old   Social History:  reports that she quit smoking about 43 years ago. Her smoking use included cigarettes. She has never used smokeless tobacco. She reports current alcohol use of about 20.0 standard drinks of alcohol per week. She reports that she does not use drugs.  Allergies  Allergen Reactions   Penicillins Hives, Swelling and Other (See Comments)   Sulfonamide Derivatives Hives and Swelling   Lactose Intolerance (Gi) Diarrhea   Sulfamethoxazole Swelling    Family History  Problem Relation Age of Onset   Coronary artery disease Mother        CABG at age 76, PPM   Colon cancer Father    Cancer - Other Father        larnyx    Prior to Admission medications   Medication Sig Start Date End Date Taking? Authorizing Provider  acetaminophen (TYLENOL) 325 MG tablet Take 1-2 tablets (325-650 mg total) by mouth every 4 (four) hours as needed for mild pain. 07/10/21  Yes Angiulli, Mcarthur Rossetti, PA-C  alprazolam Prudy Feeler) 2 MG tablet TAKE 1 TABLET(2 MG) BY MOUTH AT BEDTIME AS NEEDED FOR SLEEP 06/16/23  Yes Baxley, Luanna Cole, MD  aspirin 81 MG tablet Take 1 tablet (81 mg total) by mouth daily. 03/30/12  Yes Dunn, Tacey Ruiz, PA-C  Calcium Carbonate-Vitamin D 600-400 MG-UNIT tablet Take 1 tablet by mouth every evening. 07/10/21  Yes Angiulli, Mcarthur Rossetti, PA-C  clopidogrel (PLAVIX) 75 MG tablet TAKE 1 TABLET(75 MG) BY MOUTH DAILY 06/15/23  Yes Tonny Bollman, MD  desvenlafaxine (PRISTIQ) 50 MG 24 hr tablet TAKE 1  TABLET(50 MG) BY MOUTH DAILY 06/25/23  Yes Worthy Rancher B, FNP  diltiazem (CARDIZEM CD) 240 MG 24 hr capsule Take 1 capsule (240 mg total) by mouth daily. 08/20/23  Yes Tonny Bollman, MD  esomeprazole (NEXIUM) 40 MG capsule Take 1 capsule (40 mg total) by mouth daily at 12 noon. 07/10/21  Yes Angiulli, Mcarthur Rossetti, PA-C  hydrochlorothiazide (HYDRODIURIL) 25 MG tablet Take 1 tablet (25 mg total) by mouth daily. 05/20/23  Yes Tonny Bollman, MD  metoprolol tartrate (LOPRESSOR) 100 MG tablet Take 1 tablet (100 mg total) by mouth 2 (two) times daily. 11/02/22  Yes Tonny Bollman, MD  nitroGLYCERIN (NITROSTAT) 0.4 MG SL tablet Place 1 tablet (0.4 mg total) under the tongue every 5 (five) minutes as needed for chest pain. 06/03/22  Yes Tonny Bollman, MD  OXYGEN Inhale into the lungs. Per patient using 2 liters at night.   Yes [provider]  prochlorperazine (COMPAZINE) 10 MG tablet Take 1 tablet (10 mg total) by mouth every 8 (eight) hours as needed for nausea or vomiting. 11/25/21  Yes Margaree Mackintosh, MD  rosuvastatin (CRESTOR) 20 MG tablet TAKE 1 TABLET(20 MG) BY MOUTH DAILY 06/15/23  Yes Tonny Bollman, MD  clotrimazole-betamethasone (LOTRISONE) cream Apply 1 Application topically daily. 09/06/23   Margaree Mackintosh, MD  metFORMIN (GLUCOPHAGE) 500 MG tablet One tab by mouth daily with a meal Patient not taking: Reported on 09/20/2023 09/06/23   Margaree Mackintosh, MD    Physical Exam: Vitals:   09/20/23 1853 09/20/23 1856 09/20/23 1915 09/20/23 1930  BP: (!) 119/53  130/61 (!) 140/66  Pulse: 78  79 70  Resp: 16  (!) 21 16  Temp: 98.8 F (37.1 C)     TempSrc: Oral     SpO2: 97%  99% 100%  Weight:  71.4 kg    Height:  5' (1.524 m)     Constitutional: NAD, calm, comfortable Respiratory: clear to auscultation bilaterally, no wheezing, no crackles. Normal respiratory effort. No accessory muscle use.  Cardiovascular: Regular rate and rhythm, no murmurs / rubs / gallops. No extremity edema. 2+ pedal  pulses. No carotid bruits.  Abdomen: no tenderness, no masses palpated. No hepatosplenomegaly. Bowel sounds positive.  Musculoskeletal: RLE shortened and internally rotated Neurologic: CN 2-12 grossly intact. Sensation intact, DTR normal. Strength 5/5 in all 4.  Psychiatric: Normal judgment and insight. Alert and oriented x 3. Normal mood.   Data Reviewed:    Labs on Admission: I have personally reviewed following labs and imaging studies  CBC: Recent Labs  Lab 09/20/23 1912  WBC 6.5  NEUTROABS 5.1  HGB 13.4  HCT 42.1  MCV 91.9  PLT 164   Basic Metabolic Panel: Recent Labs  Lab 09/20/23 1912  NA 138  K 2.6*  CL 93*  CO2 33*  GLUCOSE 186*  BUN 9  CREATININE 0.89  CALCIUM 8.9   GFR: Estimated Creatinine  Clearance: 42.3 mL/min (by C-G formula based on SCr of 0.89 mg/dL). Liver Function Tests: No results for input(s): "AST", "ALT", "ALKPHOS", "BILITOT", "PROT", "ALBUMIN" in the last 168 hours. No results for input(s): "LIPASE", "AMYLASE" in the last 168 hours. No results for input(s): "AMMONIA" in the last 168 hours. Coagulation Profile: No results for input(s): "INR", "PROTIME" in the last 168 hours. Cardiac Enzymes: No results for input(s): "CKTOTAL", "CKMB", "CKMBINDEX", "TROPONINI" in the last 168 hours. BNP (last 3 results) No results for input(s): "PROBNP" in the last 8760 hours. HbA1C: No results for input(s): "HGBA1C" in the last 72 hours. CBG: No results for input(s): "GLUCAP" in the last 168 hours. Lipid Profile: No results for input(s): "CHOL", "HDL", "LDLCALC", "TRIG", "CHOLHDL", "LDLDIRECT" in the last 72 hours. Thyroid Function Tests: No results for input(s): "TSH", "T4TOTAL", "FREET4", "T3FREE", "THYROIDAB" in the last 72 hours. Anemia Panel: No results for input(s): "VITAMINB12", "FOLATE", "FERRITIN", "TIBC", "IRON", "RETICCTPCT" in the last 72 hours. Urine analysis:    Component Value Date/Time   COLORURINE STRAW (A) 07/09/2021 2131    APPEARANCEUR CLEAR 07/09/2021 2131   LABSPEC 1.004 (L) 07/09/2021 2131   PHURINE 7.0 07/09/2021 2131   GLUCOSEU NEGATIVE 07/09/2021 2131   HGBUR NEGATIVE 07/09/2021 2131   BILIRUBINUR neg 09/04/2022 1202   KETONESUR NEGATIVE 07/09/2021 2131   PROTEINUR Negative 09/04/2022 1202   PROTEINUR NEGATIVE 07/09/2021 2131   UROBILINOGEN 0.2 09/04/2022 1202   UROBILINOGEN 0.2 05/10/2009 1142   NITRITE neg 09/04/2022 1202   NITRITE NEGATIVE 07/09/2021 2131   LEUKOCYTESUR Negative 09/04/2022 1202   LEUKOCYTESUR NEGATIVE 07/09/2021 2131    Radiological Exams on Admission: DG Shoulder Right Port  Result Date: 09/20/2023 CLINICAL DATA:  Fall pain EXAM: RIGHT SHOULDER - 2 VIEW COMPARISON:  None Available. FINDINGS: There is no evidence of fracture or dislocation. Acromioclavicular degenerative change with narrowing and osteophyte formation. IMPRESSION: Acromioclavicular degenerative changes. No acute osseous abnormalities. Electronically Signed   By: Layla Maw M.D.   On: 09/20/2023 19:38   DG Hip Unilat With Pelvis 2-3 Views Right  Result Date: 09/20/2023 CLINICAL DATA:  Fall pain EXAM: DG HIP (WITH OR WITHOUT PELVIS) 3V RIGHT COMPARISON:  None Available. FINDINGS: Comminuted intertrochanteric fracture proximal right femur. Lesser trochanter fragment displaced by up to 1.5 cm. Bilateral hip osteoarthritis with joint space narrowing and osteophytes. Prior left hip ORIF. No osteolytic or osteoblastic changes. Pelvic ring intact. IMPRESSION: Comminuted displaced right proximal femoral intertrochanteric fracture. Electronically Signed   By: Layla Maw M.D.   On: 09/20/2023 19:38   DG Chest Port 1 View  Result Date: 09/20/2023 CLINICAL DATA:  Fall. EXAM: PORTABLE CHEST 1 VIEW COMPARISON:  07/10/2021. FINDINGS: Unremarkable cardiac silhouette. Calcified aorta. No pneumothorax or pleural effusion. Osseous structures appear grossly intact and are not well evaluated with chest x-ray. Unremarkable  pulmonary vasculature. Vague left basilar opacity could represent an early infectious process versus chronic scarring. IMPRESSION: Vague/early process at the left base consistent with edema, volume loss or scarring. Otherwise no acute cardiopulmonary process. Electronically Signed   By: Layla Maw M.D.   On: 09/20/2023 19:36    EKG: Independently reviewed.   Assessment and Plan: * Closed right hip fracture, initial encounter (HCC) Hip fx pathway NPO after [place] Dr. Renaye Rakers planning on fixing hip: asks that pt be admitted to [facility] (Anesthesia / OR availability I assume). Pain control with opiates (including IV) per pathway Will change her Xanax at bedtime to 1-2mg  PRN instead of just 2mg  PRN to try and  prevent oversedation. GUPTA score = 0.9% Note: Pt specifically requests to come back to [facility] for rehab when its time for CIR (had good experience with 4th floor rehab when she broke her other hip she says).  Hypokalemia Replace K Repeat BMP in AM  Hypoxemic respiratory failure, chronic (HCC) On 2L O2 at bedtime at baseline due to sleep related hypoxia (see pulm notes from Feb) Ordered O2 at bedtime.  Coronary Artery Disease H/o NSTEMI and multiple stents back in 2013. Cont metoprolol, cardizem, crestor. Holding ASA / Plavix for surgery.  HYPERTENSION, BENIGN Cont Metoprolol, cardizem Hold HCTZ      Advance Care Planning:   Code Status: Full Code  Consults: Dr. Eulah Pont  Family Communication: Daughter at bedside  Severity of Illness: The appropriate patient status for this patient is INPATIENT. Inpatient status is judged to be reasonable and necessary in order to provide the required intensity of service to ensure the patient's safety. The patient's presenting symptoms, physical exam findings, and initial radiographic and laboratory data in the context of their chronic comorbidities is felt to place them at high risk for further clinical deterioration. Furthermore, it is not  anticipated that the patient will be medically stable for discharge from the hospital within 2 midnights of admission.   * I certify that at the point of admission it is my clinical judgment that the patient will require inpatient hospital care spanning beyond 2 midnights from the point of admission due to high intensity of service, high risk for further deterioration and high frequency of surveillance required.*  Author: Hillary Bow., DO 09/20/2023 8:44 PM  For on call review [email or web address].

## 2023-09-20 NOTE — Assessment & Plan Note (Signed)
Cont Metoprolol, cardizem Hold HCTZ

## 2023-09-20 NOTE — ED Notes (Signed)
Pts IV site has been evaluated after potassium running, no complications noted at this time.

## 2023-09-20 NOTE — ED Notes (Signed)
[facility] here to pick up pt, iv site observed with no complications that was noted.

## 2023-09-20 NOTE — Progress Notes (Signed)
Orthopedic Tech Progress Note Patient Details:  Carrie Chung Sep 22, 1941 [id number]  Patient ID: Carrie Chung, female   DOB: Feb 17, 1941, 82 y.o.   MRN: [id number] Pt can't have OHF. Pt must be under 70 to get OHF. Trinna Post 09/20/2023, 8:37 PM

## 2023-09-21 ENCOUNTER — Inpatient Hospital Stay (HOSPITAL_COMMUNITY): Payer: Medicare Other | Admitting: Anesthesiology

## 2023-09-21 ENCOUNTER — Encounter (HOSPITAL_COMMUNITY)
Admission: EM | Disposition: A | Discharge status: Rehabilitation Facility | Payer: Self-pay | Source: Home / Self Care | Attending: Internal Medicine

## 2023-09-21 ENCOUNTER — Inpatient Hospital Stay (HOSPITAL_COMMUNITY): Payer: Medicare Other

## 2023-09-21 DIAGNOSIS — S72001A Fracture of unspecified part of neck of right femur, initial encounter for closed fracture: Secondary | ICD-10-CM

## 2023-09-21 DIAGNOSIS — I2510 Atherosclerotic heart disease of native coronary artery without angina pectoris: Secondary | ICD-10-CM

## 2023-09-21 HISTORY — PX: FEMUR IM NAIL: SHX1597

## 2023-09-21 LAB — BASIC METABOLIC PANEL
Anion gap: 10 (ref 5–15)
BUN: 11 mg/dL (ref 8–23)
CO2: 33 mmol/L — ABNORMAL HIGH (ref 22–32)
Calcium: 8.3 mg/dL — ABNORMAL LOW (ref 8.9–10.3)
Chloride: 92 mmol/L — ABNORMAL LOW (ref 98–111)
Creatinine, Ser: 0.79 mg/dL (ref 0.44–1.00)
GFR, Estimated: >60 mL/min (ref >60)
Glucose, Bld: 149 mg/dL — ABNORMAL HIGH (ref 70–99)
Potassium: 3.4 mmol/L — ABNORMAL LOW (ref 3.5–5.1)
Sodium: 135 mmol/L (ref 135–145)

## 2023-09-21 LAB — CBC
HCT: 35.9 % — ABNORMAL LOW (ref 36.0–46.0)
Hemoglobin: 11.4 g/dL — ABNORMAL LOW (ref 12.0–15.0)
MCH: 29 pg (ref 26.0–34.0)
MCHC: 31.8 g/dL (ref 30.0–36.0)
MCV: 91.3 fL (ref 80.0–100.0)
Platelets: 151 10*3/uL (ref 150–400)
RBC: 3.93 MIL/uL (ref 3.87–5.11)
RDW: 13.2 % (ref 11.5–15.5)
WBC: 6.4 10*3/uL (ref 4.0–10.5)
nRBC: 0 % (ref 0.0–0.2)

## 2023-09-21 LAB — GLUCOSE, CAPILLARY: Glucose-Capillary: 260 mg/dL — ABNORMAL HIGH (ref 70–99)

## 2023-09-21 SURGERY — INSERTION, INTRAMEDULLARY ROD, FEMUR
Anesthesia: General | Laterality: Right

## 2023-09-21 MED ORDER — LACTATED RINGERS IV SOLN: INTRAVENOUS | Status: DC

## 2023-09-21 MED ORDER — FENTANYL CITRATE (PF) 100 MCG/2ML IJ SOLN
INTRAMUSCULAR | Status: AC
  Filled 2023-09-21: qty 2

## 2023-09-21 MED ORDER — INSULIN ASPART 100 UNIT/ML IJ SOLN
0.0000 [IU] | Freq: Every day | INTRAMUSCULAR | Status: DC
  Administered 2023-09-21: 3 [IU] via SUBCUTANEOUS
  Administered 2023-09-22: 2 [IU] via SUBCUTANEOUS

## 2023-09-21 MED ORDER — AMISULPRIDE (ANTIEMETIC) 5 MG/2ML IV SOLN: 10.0000 mg | Freq: Once | INTRAVENOUS | Status: DC | PRN

## 2023-09-21 MED ORDER — ONDANSETRON HCL 4 MG/2ML IJ SOLN
INTRAMUSCULAR | Status: AC
  Filled 2023-09-21: qty 2

## 2023-09-21 MED ORDER — ORAL CARE MOUTH RINSE: 15.0000 mL | Freq: Once | OROMUCOSAL | Status: AC

## 2023-09-21 MED ORDER — EPHEDRINE 5 MG/ML INJ
INTRAVENOUS | Status: AC
  Filled 2023-09-21: qty 5

## 2023-09-21 MED ORDER — EPHEDRINE SULFATE-NACL 50-0.9 MG/10ML-% IV SOSY
PREFILLED_SYRINGE | INTRAVENOUS | Status: DC | PRN
  Administered 2023-09-21: 5 mg via INTRAVENOUS
  Administered 2023-09-21: 10 mg via INTRAVENOUS

## 2023-09-21 MED ORDER — PHENYLEPHRINE HCL (PRESSORS) 10 MG/ML IV SOLN
INTRAVENOUS | Status: DC | PRN
Start: 2023-09-21 — End: 2023-09-21
  Administered 2023-09-21 (×3): 160 ug via INTRAVENOUS

## 2023-09-21 MED ORDER — DEXAMETHASONE SODIUM PHOSPHATE 10 MG/ML IJ SOLN
INTRAMUSCULAR | Status: DC | PRN
  Administered 2023-09-21: 10 mg via INTRAVENOUS

## 2023-09-21 MED ORDER — POTASSIUM CHLORIDE CRYS ER 20 MEQ PO TBCR
20.0000 meq | EXTENDED_RELEASE_TABLET | Freq: Two times a day (BID) | ORAL | Status: AC
  Administered 2023-09-21 (×2): 20 meq via ORAL
  Filled 2023-09-21 (×3): qty 1

## 2023-09-21 MED ORDER — STERILE WATER FOR IRRIGATION IR SOLN
Status: DC | PRN
  Administered 2023-09-21: 1000 mL

## 2023-09-21 MED ORDER — TRANEXAMIC ACID-NACL 1000-0.7 MG/100ML-% IV SOLN
1000.0000 mg | Freq: Once | INTRAVENOUS | Status: AC
  Administered 2023-09-21: 1000 mg via INTRAVENOUS
  Filled 2023-09-21: qty 100

## 2023-09-21 MED ORDER — CEFAZOLIN SODIUM-DEXTROSE 2-4 GM/100ML-% IV SOLN
2.0000 g | Freq: Four times a day (QID) | INTRAVENOUS | Status: AC
  Administered 2023-09-21 – 2023-09-22 (×2): 2 g via INTRAVENOUS
  Filled 2023-09-21 (×2): qty 100

## 2023-09-21 MED ORDER — PHENOL 1.4 % MT LIQD: 1.0000 | OROMUCOSAL | Status: DC | PRN

## 2023-09-21 MED ORDER — OXYCODONE HCL 5 MG/5ML PO SOLN: 5.0000 mg | Freq: Once | ORAL | Status: DC | PRN

## 2023-09-21 MED ORDER — METHOCARBAMOL 1000 MG/10ML IJ SOLN: 500.0000 mg | Freq: Four times a day (QID) | INTRAVENOUS | Status: DC | PRN

## 2023-09-21 MED ORDER — CEFAZOLIN SODIUM-DEXTROSE 2-4 GM/100ML-% IV SOLN
2.0000 g | INTRAVENOUS | Status: AC
  Administered 2023-09-21: 2 g via INTRAVENOUS
  Filled 2023-09-21: qty 100

## 2023-09-21 MED ORDER — CHLORHEXIDINE GLUCONATE 0.12 % MT SOLN
15.0000 mL | Freq: Once | OROMUCOSAL | Status: AC
  Administered 2023-09-21: 15 mL via OROMUCOSAL

## 2023-09-21 MED ORDER — OXYCODONE HCL 5 MG PO TABS
10.0000 mg | ORAL_TABLET | ORAL | Status: DC | PRN
  Administered 2023-09-24 – 2023-09-28 (×2): 10 mg via ORAL
  Filled 2023-09-21 (×2): qty 2

## 2023-09-21 MED ORDER — PHENYLEPHRINE HCL (PRESSORS) 10 MG/ML IV SOLN
INTRAVENOUS | Status: AC
  Filled 2023-09-21: qty 1

## 2023-09-21 MED ORDER — INSULIN ASPART 100 UNIT/ML IJ SOLN
0.0000 [IU] | Freq: Three times a day (TID) | INTRAMUSCULAR | Status: DC
  Administered 2023-09-22: 2 [IU] via SUBCUTANEOUS
  Administered 2023-09-22 (×2): 3 [IU] via SUBCUTANEOUS
  Administered 2023-09-23 (×3): 2 [IU] via SUBCUTANEOUS
  Administered 2023-09-24 – 2023-09-25 (×5): 1 [IU] via SUBCUTANEOUS
  Administered 2023-09-26: 2 [IU] via SUBCUTANEOUS
  Administered 2023-09-26: 1 [IU] via SUBCUTANEOUS
  Administered 2023-09-26: 2 [IU] via SUBCUTANEOUS
  Administered 2023-09-27: 1 [IU] via SUBCUTANEOUS

## 2023-09-21 MED ORDER — DOCUSATE SODIUM 100 MG PO CAPS
100.0000 mg | ORAL_CAPSULE | Freq: Two times a day (BID) | ORAL | Status: DC
  Administered 2023-09-21 – 2023-09-28 (×13): 100 mg via ORAL
  Filled 2023-09-21 (×14): qty 1

## 2023-09-21 MED ORDER — METHOCARBAMOL 500 MG PO TABS
500.0000 mg | ORAL_TABLET | Freq: Four times a day (QID) | ORAL | Status: DC | PRN
  Administered 2023-09-23: 500 mg via ORAL
  Filled 2023-09-21: qty 1

## 2023-09-21 MED ORDER — DEXAMETHASONE SODIUM PHOSPHATE 10 MG/ML IJ SOLN
INTRAMUSCULAR | Status: AC
  Filled 2023-09-21: qty 1

## 2023-09-21 MED ORDER — PHENYLEPHRINE HCL-NACL 20-0.9 MG/250ML-% IV SOLN
INTRAVENOUS | Status: DC | PRN
Start: 2023-09-21 — End: 2023-09-21
  Administered 2023-09-21: 50 ug/min via INTRAVENOUS

## 2023-09-21 MED ORDER — ACETAMINOPHEN 500 MG PO TABS
1000.0000 mg | ORAL_TABLET | Freq: Once | ORAL | Status: AC
  Administered 2023-09-21: 1000 mg via ORAL
  Filled 2023-09-21: qty 2

## 2023-09-21 MED ORDER — PHENYLEPHRINE 80 MCG/ML (10ML) SYRINGE FOR IV PUSH (FOR BLOOD PRESSURE SUPPORT)
PREFILLED_SYRINGE | INTRAVENOUS | Status: AC
  Filled 2023-09-21: qty 10

## 2023-09-21 MED ORDER — MENTHOL 3 MG MT LOZG: 1.0000 | LOZENGE | OROMUCOSAL | Status: DC | PRN

## 2023-09-21 MED ORDER — SUGAMMADEX SODIUM 200 MG/2ML IV SOLN
INTRAVENOUS | Status: DC | PRN
Start: 2023-09-21 — End: 2023-09-21
  Administered 2023-09-21: 200 mg via INTRAVENOUS

## 2023-09-21 MED ORDER — BISACODYL 10 MG RE SUPP: 10.0000 mg | Freq: Every day | RECTAL | Status: DC | PRN

## 2023-09-21 MED ORDER — ONDANSETRON HCL 4 MG/2ML IJ SOLN
INTRAMUSCULAR | Status: DC | PRN
  Administered 2023-09-21: 4 mg via INTRAVENOUS

## 2023-09-21 MED ORDER — ACETAMINOPHEN 500 MG PO TABS
1000.0000 mg | ORAL_TABLET | Freq: Four times a day (QID) | ORAL | Status: AC
  Administered 2023-09-21 – 2023-09-22 (×4): 1000 mg via ORAL
  Filled 2023-09-21 (×4): qty 2

## 2023-09-21 MED ORDER — ROCURONIUM BROMIDE 10 MG/ML (PF) SYRINGE
PREFILLED_SYRINGE | INTRAVENOUS | Status: AC
  Filled 2023-09-21: qty 10

## 2023-09-21 MED ORDER — LIDOCAINE HCL (PF) 2 % IJ SOLN
INTRAMUSCULAR | Status: AC
  Filled 2023-09-21: qty 5

## 2023-09-21 MED ORDER — PROPOFOL 10 MG/ML IV BOLUS
INTRAVENOUS | Status: AC
  Filled 2023-09-21: qty 20

## 2023-09-21 MED ORDER — TRANEXAMIC ACID-NACL 1000-0.7 MG/100ML-% IV SOLN
1000.0000 mg | INTRAVENOUS | Status: AC
  Administered 2023-09-21: 1000 mg via INTRAVENOUS
  Filled 2023-09-21: qty 100

## 2023-09-21 MED ORDER — OXYCODONE HCL 5 MG PO TABS
5.0000 mg | ORAL_TABLET | ORAL | Status: DC | PRN
  Administered 2023-09-22 – 2023-09-27 (×7): 5 mg via ORAL
  Filled 2023-09-21 (×7): qty 1

## 2023-09-21 MED ORDER — ACETAMINOPHEN 325 MG PO TABS: 325.0000 mg | ORAL_TABLET | Freq: Four times a day (QID) | ORAL | Status: DC | PRN

## 2023-09-21 MED ORDER — POVIDONE-IODINE 10 % EX SWAB: 2.0000 | Freq: Once | CUTANEOUS | Status: DC

## 2023-09-21 MED ORDER — HYDROMORPHONE HCL 1 MG/ML IJ SOLN: 0.5000 mg | INTRAMUSCULAR | Status: DC | PRN

## 2023-09-21 MED ORDER — LIDOCAINE HCL (CARDIAC) PF 100 MG/5ML IV SOSY
PREFILLED_SYRINGE | INTRAVENOUS | Status: DC | PRN
  Administered 2023-09-21: 60 mg via INTRAVENOUS

## 2023-09-21 MED ORDER — SODIUM CHLORIDE 0.9 % IR SOLN
Status: DC | PRN
  Administered 2023-09-21: 1000 mL

## 2023-09-21 MED ORDER — METOCLOPRAMIDE HCL 5 MG/ML IJ SOLN: 5.0000 mg | Freq: Three times a day (TID) | INTRAMUSCULAR | Status: DC | PRN

## 2023-09-21 MED ORDER — ROCURONIUM BROMIDE 100 MG/10ML IV SOLN
INTRAVENOUS | Status: DC | PRN
  Administered 2023-09-21: 50 mg via INTRAVENOUS

## 2023-09-21 MED ORDER — DEXAMETHASONE SODIUM PHOSPHATE 10 MG/ML IJ SOLN: 8.0000 mg | Freq: Once | INTRAMUSCULAR | Status: DC

## 2023-09-21 MED ORDER — POLYETHYLENE GLYCOL 3350 17 G PO PACK: 17.0000 g | PACK | Freq: Every day | ORAL | Status: DC | PRN

## 2023-09-21 MED ORDER — FENTANYL CITRATE (PF) 100 MCG/2ML IJ SOLN
INTRAMUSCULAR | Status: DC | PRN
  Administered 2023-09-21 (×2): 25 ug via INTRAVENOUS
  Administered 2023-09-21: 50 ug via INTRAVENOUS

## 2023-09-21 MED ORDER — CLOPIDOGREL BISULFATE 75 MG PO TABS
75.0000 mg | ORAL_TABLET | Freq: Every day | ORAL | Status: DC
  Administered 2023-09-22 – 2023-09-23 (×2): 75 mg via ORAL
  Filled 2023-09-21 (×3): qty 1

## 2023-09-21 MED ORDER — PROPOFOL 10 MG/ML IV BOLUS
INTRAVENOUS | Status: DC | PRN
Start: 2023-09-21 — End: 2023-09-21
  Administered 2023-09-21: 100 mg via INTRAVENOUS

## 2023-09-21 MED ORDER — FENTANYL CITRATE PF 50 MCG/ML IJ SOSY: 25.0000 ug | PREFILLED_SYRINGE | INTRAMUSCULAR | Status: DC | PRN

## 2023-09-21 MED ORDER — OXYCODONE HCL 5 MG PO TABS: 5.0000 mg | ORAL_TABLET | Freq: Once | ORAL | Status: DC | PRN

## 2023-09-21 MED ORDER — ALUM & MAG HYDROXIDE-SIMETH 200-200-20 MG/5ML PO SUSP
30.0000 mL | ORAL | Status: DC | PRN
  Administered 2023-09-22: 30 mL via ORAL
  Filled 2023-09-21: qty 30

## 2023-09-21 MED ORDER — ACETAMINOPHEN 500 MG PO TABS: 1000.0000 mg | ORAL_TABLET | Freq: Once | ORAL | Status: DC

## 2023-09-21 MED ORDER — METOCLOPRAMIDE HCL 5 MG PO TABS
5.0000 mg | ORAL_TABLET | Freq: Three times a day (TID) | ORAL | Status: DC | PRN
  Administered 2023-09-24 – 2023-09-27 (×4): 10 mg via ORAL
  Filled 2023-09-21 (×4): qty 2

## 2023-09-21 SURGICAL SUPPLY — 42 items
APL PRP STRL LF DISP 70% ISPRP (MISCELLANEOUS) ×1
BAG COUNTER SPONGE SURGICOUNT (BAG) ×2 IMPLANT
BAG SPNG CNTER NS LX DISP (BAG) ×1
BIT DRILL AO GAMMA 4.2X340 (BIT) IMPLANT
CHLORAPREP W/TINT 26 (MISCELLANEOUS) ×2 IMPLANT
CLSR STERI-STRIP ANTIMIC 1/2X4 (GAUZE/BANDAGES/DRESSINGS) ×2 IMPLANT
COVER MAYO STAND STRL (DRAPES) ×2 IMPLANT
COVER PERINEAL POST (MISCELLANEOUS) ×2 IMPLANT
COVER SURGICAL LIGHT HANDLE (MISCELLANEOUS) ×2 IMPLANT
DRESSING MEPILEX FLEX 4X4 (GAUZE/BANDAGES/DRESSINGS) ×4 IMPLANT
DRSG MEPILEX FLEX 4X4 (GAUZE/BANDAGES/DRESSINGS) ×3
ELECT REM PT RETURN 15FT ADLT (MISCELLANEOUS) ×2 IMPLANT
GAUZE SPONGE 4X4 12PLY STRL (GAUZE/BANDAGES/DRESSINGS) IMPLANT
GLOVE BIO SURGEON STRL SZ7.5 (GLOVE) ×2 IMPLANT
GLOVE BIOGEL PI IND STRL 7.5 (GLOVE) ×2 IMPLANT
GLOVE BIOGEL PI IND STRL 8 (GLOVE) ×4 IMPLANT
GLOVE SURG SYN 7.5 E (GLOVE) ×1 IMPLANT
GLOVE SURG SYN 7.5 PF PI (GLOVE) ×2 IMPLANT
GOWN STRL REUS W/ TWL LRG LVL3 (GOWN DISPOSABLE) ×2 IMPLANT
GOWN STRL REUS W/ TWL XL LVL3 (GOWN DISPOSABLE) ×2 IMPLANT
GOWN STRL REUS W/TWL LRG LVL3 (GOWN DISPOSABLE) ×1
GOWN STRL REUS W/TWL XL LVL3 (GOWN DISPOSABLE) ×1
HEMOSTAT SURGICEL 4X8 (HEMOSTASIS) IMPLANT
K-WIRE 3.2X450M STR (WIRE) ×1
KIT BASIN OR (CUSTOM PROCEDURE TRAY) ×2 IMPLANT
KIT TURNOVER KIT A (KITS) IMPLANT
KWIRE 3.2X450M STR (WIRE) IMPLANT
MANIFOLD NEPTUNE II (INSTRUMENTS) ×2 IMPLANT
NAIL KIT TROCH 10X170X125 (Nail) IMPLANT
NS IRRIG 1000ML POUR BTL (IV SOLUTION) ×2 IMPLANT
PACK GENERAL/GYN (CUSTOM PROCEDURE TRAY) ×2 IMPLANT
PAD ARMBOARD 7.5X6 YLW CONV (MISCELLANEOUS) ×4 IMPLANT
SCREW LAG GAMMA 3 95MM (Screw) IMPLANT
SCREW LOCKING T2 F/T 5X32.5MM (Screw) IMPLANT
STRIP CLOSURE SKIN 1/2X4 (GAUZE/BANDAGES/DRESSINGS) ×2 IMPLANT
SUT MNCRL AB 4-0 PS2 18 (SUTURE) ×2 IMPLANT
SUT VIC AB 2-0 CT1 27 (SUTURE) ×1
SUT VIC AB 2-0 CT1 TAPERPNT 27 (SUTURE) ×2 IMPLANT
TAPE CLOTH SURG 4X10 WHT LF (GAUZE/BANDAGES/DRESSINGS) IMPLANT
TOWEL OR 17X26 10 PK STRL BLUE (TOWEL DISPOSABLE) ×2 IMPLANT
TOWEL OR NON WOVEN STRL DISP B (DISPOSABLE) ×2 IMPLANT
WATER STERILE IRR 1000ML POUR (IV SOLUTION) ×2 IMPLANT

## 2023-09-21 NOTE — Consult Note (Signed)
ORTHOPAEDIC CONSULTATION  REQUESTING PHYSICIAN: Darlin Drop, DO  Time called na Time arrived na  Chief Complaint: Hip fracture  HPI: I have reviewed and agree with below history:  Carrie Chung is a 82 y.o. female with medical history significant of CAD, NSTEMI s/p stents, HTN, HLD.   Pt in to ED with R hip pain after mechanical fall at home.  Pain severe, unable to bear wt.   Also has R shoulder pain.   Denies head trauma, denies LOC.  Fall was due to tripping up over stairs.  Past Medical History:  Diagnosis Date   Atrial tachycardia    a. noted at cardiac rehab 5/13; event monitor ordered to assess for AFib   CAD (coronary artery disease)    a. NSTEMI 03/2012 (LHC 03/27/12: pLAD 30%, mLAD 99%, mCFX 95%, mRCA 99% with thrombus, EF 65%);  s/p PTCA/DESx1 to prox-mid RCA 03/27/12 urgently in setting of hypotension/bradycardia, with staged PTCA/Evolve study stent to mid LAD & PTCA/Evolve study stent to prox LCx 03/29/12 ;   echo 03/28/12:EF 60%, Aortic sclerosis without AS, mild RVE, mild reduced RVSF     Endometrial polyp    HTN (hypertension)    Hyperlipidemia    MI, old    Sleep related hypoxia    Past Surgical History:  Procedure Laterality Date   APPENDECTOMY  82 years old   CAROTID STENT  04/2012   x3   CESAREAN SECTION  1984   CHOLECYSTECTOMY  1978   INTRAMEDULLARY (IM) NAIL INTERTROCHANTERIC Left 06/20/2021   Procedure: INTRAMEDULLARY (IM) NAIL INTERTROCHANTRIC;  Surgeon: Sheral Apley, MD;  Location: [facility] OR;  Service: Orthopedics;  Laterality: Left;   LEFT HEART CATHETERIZATION WITH CORONARY ANGIOGRAM N/A 03/27/2012   Procedure: LEFT HEART CATHETERIZATION WITH CORONARY ANGIOGRAM;  Surgeon: Kathleene Hazel, MD;  Location: [facility] CATH LAB;  Service: Cardiovascular;  Laterality: N/A;   PERCUTANEOUS CORONARY STENT INTERVENTION (PCI-S) N/A 03/27/2012   Procedure: PERCUTANEOUS CORONARY STENT INTERVENTION (PCI-S);  Surgeon: Kathleene Hazel, MD;  Location: [facility]  CATH LAB;  Service: Cardiovascular;  Laterality: N/A;   PERCUTANEOUS CORONARY STENT INTERVENTION (PCI-S) N/A 03/29/2012   Procedure: PERCUTANEOUS CORONARY STENT INTERVENTION (PCI-S);  Surgeon: Tonny Bollman, MD;  Location: [facility] CATH LAB;  Service: Cardiovascular;  Laterality: N/A;   TONSILLECTOMY  82 years old   Social History   Socioeconomic History   Marital status: Married    Spouse name: Not on file   Number of children: 1   Years of education: Coll +   Highest education level: Not on file  Occupational History   Occupation: Retired Engineer, civil (consulting)  Tobacco Use   Smoking status: Former    Current packs/day: 0.00    Types: Cigarettes    Quit date: 04/19/1980    Years since quitting: 43.4   Smokeless tobacco: Never  Substance and Sexual Activity   Alcohol use: Yes    Alcohol/week: 20.0 standard drinks of alcohol    Types: 20 Glasses of wine per week    Comment: socially   Drug use: No   Sexual activity: Never  Other Topics Concern   Not on file  Social History Narrative   Caffeine 2 cups tea.   Social Determinants of Health   Financial Resource Strain: Low Risk  (09/03/2021)   Overall Financial Resource Strain (CARDIA)    Difficulty of Paying Living Expenses: Not hard at all  Food Insecurity: No Food Insecurity (09/20/2023)   Hunger Vital Sign    Worried  About Running Out of Food in the Last Year: Never true    Ran Out of Food in the Last Year: Never true  Transportation Needs: No Transportation Needs (09/20/2023)   PRAPARE - Administrator, Civil Service (Medical): No    Lack of Transportation (Non-Medical): No  Physical Activity: Inactive (09/03/2021)   Exercise Vital Sign    Days of Exercise per Week: 0 days    Minutes of Exercise per Session: 0 min  Stress: No Stress Concern Present (09/03/2021)   [employer] of Occupational Health - Occupational Stress Questionnaire    Feeling of Stress : Only a little  Social Connections: Socially Integrated (09/03/2021)    Social Connection and Isolation Panel [NHANES]    Frequency of Communication with Friends and Family: Twice a week    Frequency of Social Gatherings with Friends and Family: Twice a week    Attends Religious Services: 1 to 4 times per year    Active Member of [employer] or Organizations: Yes    Attends Banker Meetings: 1 to 4 times per year    Marital Status: Married   Family History  Problem Relation Age of Onset   Coronary artery disease Mother        CABG at age 22, PPM   Colon cancer Father    Cancer - Other Father        larnyx   Allergies  Allergen Reactions   Penicillins Hives, Swelling and Other (See Comments)   Sulfonamide Derivatives Hives and Swelling   Lactose Intolerance (Gi) Diarrhea   Sulfamethoxazole Swelling   Prior to Admission medications   Medication Sig Start Date End Date Taking? Authorizing Provider  acetaminophen (TYLENOL) 325 MG tablet Take 1-2 tablets (325-650 mg total) by mouth every 4 (four) hours as needed for mild pain. 07/10/21  Yes Angiulli, Mcarthur Rossetti, PA-C  alprazolam Prudy Feeler) 2 MG tablet TAKE 1 TABLET(2 MG) BY MOUTH AT BEDTIME AS NEEDED FOR SLEEP 06/16/23  Yes Baxley, Luanna Cole, MD  aspirin 81 MG tablet Take 1 tablet (81 mg total) by mouth daily. 03/30/12  Yes Dunn, Tacey Ruiz, PA-C  Calcium Carbonate-Vitamin D 600-400 MG-UNIT tablet Take 1 tablet by mouth every evening. 07/10/21  Yes Angiulli, Mcarthur Rossetti, PA-C  clopidogrel (PLAVIX) 75 MG tablet TAKE 1 TABLET(75 MG) BY MOUTH DAILY 06/15/23  Yes Tonny Bollman, MD  desvenlafaxine (PRISTIQ) 50 MG 24 hr tablet TAKE 1 TABLET(50 MG) BY MOUTH DAILY 06/25/23  Yes Worthy Rancher B, FNP  diltiazem (CARDIZEM CD) 240 MG 24 hr capsule Take 1 capsule (240 mg total) by mouth daily. 08/20/23  Yes Tonny Bollman, MD  esomeprazole (NEXIUM) 40 MG capsule Take 1 capsule (40 mg total) by mouth daily at 12 noon. 07/10/21  Yes Angiulli, Mcarthur Rossetti, PA-C  hydrochlorothiazide (HYDRODIURIL) 25 MG tablet Take 1 tablet (25 mg total) by  mouth daily. 05/20/23  Yes Tonny Bollman, MD  metoprolol tartrate (LOPRESSOR) 100 MG tablet Take 1 tablet (100 mg total) by mouth 2 (two) times daily. 11/02/22  Yes Tonny Bollman, MD  nitroGLYCERIN (NITROSTAT) 0.4 MG SL tablet Place 1 tablet (0.4 mg total) under the tongue every 5 (five) minutes as needed for chest pain. 06/03/22  Yes Tonny Bollman, MD  OXYGEN Inhale into the lungs. Per patient using 2 liters at night.   Yes [provider]  prochlorperazine (COMPAZINE) 10 MG tablet Take 1 tablet (10 mg total) by mouth every 8 (eight) hours as needed for nausea or vomiting. 11/25/21  Yes Margaree Mackintosh, MD  rosuvastatin (CRESTOR) 20 MG tablet TAKE 1 TABLET(20 MG) BY MOUTH DAILY 06/15/23  Yes Tonny Bollman, MD  clotrimazole-betamethasone (LOTRISONE) cream Apply 1 Application topically daily. 09/06/23   Margaree Mackintosh, MD  metFORMIN (GLUCOPHAGE) 500 MG tablet One tab by mouth daily with a meal Patient not taking: Reported on 09/20/2023 09/06/23   Margaree Mackintosh, MD   DG Shoulder Right Port  Result Date: 09/20/2023 CLINICAL DATA:  Fall pain EXAM: RIGHT SHOULDER - 2 VIEW COMPARISON:  None Available. FINDINGS: There is no evidence of fracture or dislocation. Acromioclavicular degenerative change with narrowing and osteophyte formation. IMPRESSION: Acromioclavicular degenerative changes. No acute osseous abnormalities. Electronically Signed   By: Layla Maw M.D.   On: 09/20/2023 19:38   DG Hip Unilat With Pelvis 2-3 Views Right  Result Date: 09/20/2023 CLINICAL DATA:  Fall pain EXAM: DG HIP (WITH OR WITHOUT PELVIS) 3V RIGHT COMPARISON:  None Available. FINDINGS: Comminuted intertrochanteric fracture proximal right femur. Lesser trochanter fragment displaced by up to 1.5 cm. Bilateral hip osteoarthritis with joint space narrowing and osteophytes. Prior left hip ORIF. No osteolytic or osteoblastic changes. Pelvic ring intact. IMPRESSION: Comminuted displaced right proximal femoral  intertrochanteric fracture. Electronically Signed   By: Layla Maw M.D.   On: 09/20/2023 19:38   DG Chest Port 1 View  Result Date: 09/20/2023 CLINICAL DATA:  Fall. EXAM: PORTABLE CHEST 1 VIEW COMPARISON:  07/10/2021. FINDINGS: Unremarkable cardiac silhouette. Calcified aorta. No pneumothorax or pleural effusion. Osseous structures appear grossly intact and are not well evaluated with chest x-ray. Unremarkable pulmonary vasculature. Vague left basilar opacity could represent an early infectious process versus chronic scarring. IMPRESSION: Vague/early process at the left base consistent with edema, volume loss or scarring. Otherwise no acute cardiopulmonary process. Electronically Signed   By: Layla Maw M.D.   On: 09/20/2023 19:36    Positive ROS: All other systems have been reviewed and were otherwise negative with the exception of those mentioned in the HPI and as above.  Labs cbc Recent Labs    09/20/23 1912 09/21/23 0448  WBC 6.5 6.4  HGB 13.4 11.4*  HCT 42.1 35.9*  PLT 164 151    Labs inflam No results for input(s): "CRP" in the last 72 hours.  Invalid input(s): "ESR"  Labs coag No results for input(s): "INR", "PTT" in the last 72 hours.  Invalid input(s): "PT"  Recent Labs    09/20/23 1912 09/21/23 0448  NA 138 135  K 2.6* 3.4*  CL 93* 92*  CO2 33* 33*  GLUCOSE 186* 149*  BUN 9 11  CREATININE 0.89 0.79  CALCIUM 8.9 8.3*    Physical Exam: Vitals:   09/20/23 2318 09/21/23 0546  BP: 136/73 130/64  Pulse: 81 75  Resp: 20 18  Temp: 98.5 F (36.9 C) 98.2 F (36.8 C)  SpO2: 98% 97%   General: Alert, no acute distress Cardiovascular: No pedal edema Respiratory: No cyanosis, no use of accessory musculature GI: No organomegaly, abdomen is soft and non-tender Skin: No lesions in the area of chief complaint other than those listed below in MSK exam.  Neurologic: Sensation intact distally save for the below mentioned MSK exam Psychiatric: Patient is  competent for consent with normal mood and affect Lymphatic: No axillary or cervical lymphadenopathy  MUSCULOSKELETAL:  RLE: pain at the hip, NVI, compartments soft Other extremities are atraumatic with painless ROM and NVI.  Assessment: R hip intertroch fracture  Plan: I discussed risks and benefits of R  hip nail fixation with her and family. They wish to proceed.    Sheral Apley, MD    09/21/2023 8:49 AM

## 2023-09-21 NOTE — Progress Notes (Signed)
Initial Nutrition Assessment  DOCUMENTATION CODES:   Obesity unspecified  INTERVENTION:  - NPO for OR today. Would recommend Regular diet once medically appropriate.  - Recommend [employer] 1.4 PO BID once diet advanced. Each supplement provides 455 kcal and 20 grams protein. - Multivitamin with minerals daily - Monitor weight trends.   NUTRITION DIAGNOSIS:   Increased nutrient needs related to hip fracture as evidenced by estimated needs.  GOAL:   Patient will meet greater than or equal to 90% of their needs  MONITOR:   PO intake, Supplement acceptance, Diet advancement, Weight trends  REASON FOR ASSESSMENT:   Consult Hip fracture protocol  ASSESSMENT:   82 y.o. female with PMH significant of CAD, NSTEMI s/p stents, HTN, HLD who presented with R hip pain after mechanical fall at home. Admitted for right hip fracture.    Patient did not state a UBW but reports she lost 25# 2 years ago when she had a femur fracture. However, reports she gained it all back. Patient stated she had lost a lot of weight over the past 1 year but then kept referring back to fracture 2 years ago.  Per EMR, weight without significant changes over the past year.   Patient endorses eating normally PTA. Usually has tea and a banana for breakfast, will skip lunch or only have yogurt, and usually has her main meal at dinner, which varies. Appetite normal recently. Doesn't drink nutrition supplements at home as she notes they have given her diarrhea and she is lactose intolerance.   Patient currently NPO for OR today. She is agreeable to try plant based supplement Molli Posey to support intake once diet advanced.   Medications reviewed and include: -  Labs reviewed:  K+ 3.4 HA1C 6.6   NUTRITION - FOCUSED PHYSICAL EXAM:  Flowsheet Row Most Recent Value  Orbital Region No depletion  Upper Arm Region No depletion  Thoracic and Lumbar Region No depletion  Buccal Region No depletion  Temple Region  Mild depletion  Clavicle Bone Region No depletion  Clavicle and Acromion Bone Region No depletion  Scapular Bone Region Unable to assess  Dorsal Hand No depletion  Patellar Region No depletion  Anterior Thigh Region No depletion  Posterior Calf Region No depletion  Edema (RD Assessment) None  Hair Reviewed  Eyes Reviewed  Mouth Reviewed  Skin Reviewed  Nails Reviewed       Diet Order:   Diet Order             Diet NPO time specified Except for: Sips with Meds  Diet effective midnight                   EDUCATION NEEDS:  Education needs have been addressed  Skin:  Skin Assessment: Reviewed RN Assessment  Last BM:  unknown  Height:  Ht Readings from Last 1 Encounters:  09/20/23 5' (1.524 m)   Weight:  Wt Readings from Last 1 Encounters:  09/20/23 71.4 kg   Ideal Body Weight:  45.45 kg  BMI:  Body mass index is 30.74 kg/m.  Estimated Nutritional Needs:  Kcal:    Protein:    Fluid:       Shelle Iron RD, LDN For contact information, refer to [facility].

## 2023-09-21 NOTE — Anesthesia Preprocedure Evaluation (Addendum)
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 Has had extensive pulmonary w/u and no real etiology for this chronic respiratory failure Currently feels like she is on day 4 of a URI involving her L-sided sinuses, some residual cough. Was not tested for viruses in the ED/on the floor Saturating low 80s% on room air, up to mid-90s% on 4LPM [place] in preop   Pulmonary exam normal breath sounds clear to auscultation       Cardiovascular hypertension (124/59 preop), Pt. on medications and Pt. on home beta blockers + CAD, + Past MI (2013) and + Cardiac Stents (2013, plavix- LD yesterday)  Normal cardiovascular exam Rhythm:Regular Rate:Normal  Echo 2016 - Left ventricle: The cavity size was normal. Wall thickness was    normal. Systolic function was normal. The estimated ejection    fraction was in the range of 60% to 65%. Wall motion was normal;    there were no regional wall motion abnormalities. Doppler    parameters are consistent with abnormal left ventricular    relaxation (grade 1 diastolic dysfunction).  - Mitral valve: Calcified annulus. Normal thickness leaflets .  - Atrial septum: No defect or patent foramen ovale was identified.    Echo contrast study showed no right-to-left atrial level shunt,    at baseline or with provocation.   Stress test 04/2023:   The study is normal. The study is low risk.   No ST deviation was noted.   LV  perfusion is normal. There is no evidence of ischemia. There is no evidence of infarction.   Left ventricular function is normal. Nuclear stress EF: 61 %. The left ventricular ejection fraction is normal (55-65%). End diastolic cavity size is normal. End systolic cavity size is normal.    Neuro/Psych  PSYCHIATRIC DISORDERS Anxiety Depression    negative neurological ROS     GI/Hepatic Neg liver ROS,GERD  Medicated and Controlled,,  Endo/Other  diabetes, Well Controlled, Type 2, Oral Hypoglycemic Agents  A1c 6.6 Obesity bMI 31  Renal/GU negative Renal ROS  negative genitourinary   Musculoskeletal negative musculoskeletal ROS (+)    Abdominal  (+) + obese  Peds  Hematology negative hematology ROS (+)   Anesthesia Other Findings   Reproductive/Obstetrics negative OB ROS                             Anesthesia Physical Anesthesia Plan  ASA: 4  Anesthesia Plan: General   Post-op Pain Management: Tylenol PO (pre-op)*   Induction: Intravenous  PONV Risk Score and Plan: 3 and Ondansetron, Dexamethasone and Treatment may vary due to age or medical condition  Airway Management Planned: Oral ETT  Additional Equipment: None  Intra-op Plan:   Post-operative Plan: Extubation in OR  Informed Consent: I have reviewed the patients History and Physical, chart, labs and discussed the procedure including the risks, benefits and alternatives for the proposed anesthesia with the patient or authorized representative who has  indicated his/her understanding and acceptance.     Dental advisory given  Plan Discussed with: CRNA  Anesthesia Plan Comments: (Not appropriately off blood thinners for spinal anesthesia  Active type and screen Poor pulmonary status at baseline +/- respiratory illness? D/w pt will likely be sent from PACU to floor w/ oxygen. Does not use inhalers or nebs at home.)       Anesthesia Quick Evaluation

## 2023-09-21 NOTE — Transfer of Care (Signed)
Immediate Anesthesia Transfer of Care Note  Patient: Carrie Chung  Procedure(s) Performed: INTRAMEDULLARY (IM) NAIL FEMORAL (Right)  Patient Location: PACU  Anesthesia Type:General  Level of Consciousness: awake, drowsy, and patient cooperative  Airway & Oxygen Therapy: Patient Spontanous Breathing and Patient connected to face mask oxygen  Post-op Assessment: Report given to RN, Post -op Vital signs reviewed and stable, and Patient moving all extremities  Post vital signs: Reviewed and stable  Last Vitals:  Vitals Value Taken Time  BP 101/64 09/21/23 1653  Temp    Pulse 70 09/21/23 1658  Resp 15 09/21/23 1658  SpO2 89 % 09/21/23 1658  Vitals shown include unfiled device data.  Last Pain:  Vitals:   09/21/23 1402  TempSrc: Oral  PainSc:          Complications: No notable events documented.

## 2023-09-21 NOTE — H&P (View-Only) (Signed)
ORTHOPAEDIC CONSULTATION  REQUESTING PHYSICIAN: Darlin Drop, DO  Time called na Time arrived na  Chief Complaint: Hip fracture  HPI: I have reviewed and agree with below history:  Carrie Chung is a 82 y.o. female with medical history significant of CAD, NSTEMI s/p stents, HTN, HLD.   Pt in to ED with R hip pain after mechanical fall at home.  Pain severe, unable to bear wt.   Also has R shoulder pain.   Denies head trauma, denies LOC.  Fall was due to tripping up over stairs.  Past Medical History:  Diagnosis Date   Atrial tachycardia    a. noted at cardiac rehab 5/13; event monitor ordered to assess for AFib   CAD (coronary artery disease)    a. NSTEMI 03/2012 (LHC 03/27/12: pLAD 30%, mLAD 99%, mCFX 95%, mRCA 99% with thrombus, EF 65%);  s/p PTCA/DESx1 to prox-mid RCA 03/27/12 urgently in setting of hypotension/bradycardia, with staged PTCA/Evolve study stent to mid LAD & PTCA/Evolve study stent to prox LCx 03/29/12 ;   echo 03/28/12:EF 60%, Aortic sclerosis without AS, mild RVE, mild reduced RVSF     Endometrial polyp    HTN (hypertension)    Hyperlipidemia    MI, old    Sleep related hypoxia    Past Surgical History:  Procedure Laterality Date   APPENDECTOMY  82 years old   CAROTID STENT  04/2012   x3   CESAREAN SECTION  1984   CHOLECYSTECTOMY  1978   INTRAMEDULLARY (IM) NAIL INTERTROCHANTERIC Left 06/20/2021   Procedure: INTRAMEDULLARY (IM) NAIL INTERTROCHANTRIC;  Surgeon: Sheral Apley, MD;  Location: [facility] OR;  Service: Orthopedics;  Laterality: Left;   LEFT HEART CATHETERIZATION WITH CORONARY ANGIOGRAM N/A 03/27/2012   Procedure: LEFT HEART CATHETERIZATION WITH CORONARY ANGIOGRAM;  Surgeon: Kathleene Hazel, MD;  Location: [facility] CATH LAB;  Service: Cardiovascular;  Laterality: N/A;   PERCUTANEOUS CORONARY STENT INTERVENTION (PCI-S) N/A 03/27/2012   Procedure: PERCUTANEOUS CORONARY STENT INTERVENTION (PCI-S);  Surgeon: Kathleene Hazel, MD;  Location: [facility]  CATH LAB;  Service: Cardiovascular;  Laterality: N/A;   PERCUTANEOUS CORONARY STENT INTERVENTION (PCI-S) N/A 03/29/2012   Procedure: PERCUTANEOUS CORONARY STENT INTERVENTION (PCI-S);  Surgeon: Tonny Bollman, MD;  Location: [facility] CATH LAB;  Service: Cardiovascular;  Laterality: N/A;   TONSILLECTOMY  82 years old   Social History   Socioeconomic History   Marital status: Married    Spouse name: Not on file   Number of children: 1   Years of education: Coll +   Highest education level: Not on file  Occupational History   Occupation: Retired Engineer, civil (consulting)  Tobacco Use   Smoking status: Former    Current packs/day: 0.00    Types: Cigarettes    Quit date: 04/19/1980    Years since quitting: 43.4   Smokeless tobacco: Never  Substance and Sexual Activity   Alcohol use: Yes    Alcohol/week: 20.0 standard drinks of alcohol    Types: 20 Glasses of wine per week    Comment: socially   Drug use: No   Sexual activity: Never  Other Topics Concern   Not on file  Social History Narrative   Caffeine 2 cups tea.   Social Determinants of Health   Financial Resource Strain: Low Risk  (09/03/2021)   Overall Financial Resource Strain (CARDIA)    Difficulty of Paying Living Expenses: Not hard at all  Food Insecurity: No Food Insecurity (09/20/2023)   Hunger Vital Sign    Worried  About Running Out of Food in the Last Year: Never true    Ran Out of Food in the Last Year: Never true  Transportation Needs: No Transportation Needs (09/20/2023)   PRAPARE - Administrator, Civil Service (Medical): No    Lack of Transportation (Non-Medical): No  Physical Activity: Inactive (09/03/2021)   Exercise Vital Sign    Days of Exercise per Week: 0 days    Minutes of Exercise per Session: 0 min  Stress: No Stress Concern Present (09/03/2021)   [employer] of Occupational Health - Occupational Stress Questionnaire    Feeling of Stress : Only a little  Social Connections: Socially Integrated (09/03/2021)    Social Connection and Isolation Panel [NHANES]    Frequency of Communication with Friends and Family: Twice a week    Frequency of Social Gatherings with Friends and Family: Twice a week    Attends Religious Services: 1 to 4 times per year    Active Member of [employer] or Organizations: Yes    Attends Banker Meetings: 1 to 4 times per year    Marital Status: Married   Family History  Problem Relation Age of Onset   Coronary artery disease Mother        CABG at age 22, PPM   Colon cancer Father    Cancer - Other Father        larnyx   Allergies  Allergen Reactions   Penicillins Hives, Swelling and Other (See Comments)   Sulfonamide Derivatives Hives and Swelling   Lactose Intolerance (Gi) Diarrhea   Sulfamethoxazole Swelling   Prior to Admission medications   Medication Sig Start Date End Date Taking? Authorizing Provider  acetaminophen (TYLENOL) 325 MG tablet Take 1-2 tablets (325-650 mg total) by mouth every 4 (four) hours as needed for mild pain. 07/10/21  Yes Angiulli, Mcarthur Rossetti, PA-C  alprazolam Prudy Feeler) 2 MG tablet TAKE 1 TABLET(2 MG) BY MOUTH AT BEDTIME AS NEEDED FOR SLEEP 06/16/23  Yes Baxley, Luanna Cole, MD  aspirin 81 MG tablet Take 1 tablet (81 mg total) by mouth daily. 03/30/12  Yes Dunn, Tacey Ruiz, PA-C  Calcium Carbonate-Vitamin D 600-400 MG-UNIT tablet Take 1 tablet by mouth every evening. 07/10/21  Yes Angiulli, Mcarthur Rossetti, PA-C  clopidogrel (PLAVIX) 75 MG tablet TAKE 1 TABLET(75 MG) BY MOUTH DAILY 06/15/23  Yes Tonny Bollman, MD  desvenlafaxine (PRISTIQ) 50 MG 24 hr tablet TAKE 1 TABLET(50 MG) BY MOUTH DAILY 06/25/23  Yes Worthy Rancher B, FNP  diltiazem (CARDIZEM CD) 240 MG 24 hr capsule Take 1 capsule (240 mg total) by mouth daily. 08/20/23  Yes Tonny Bollman, MD  esomeprazole (NEXIUM) 40 MG capsule Take 1 capsule (40 mg total) by mouth daily at 12 noon. 07/10/21  Yes Angiulli, Mcarthur Rossetti, PA-C  hydrochlorothiazide (HYDRODIURIL) 25 MG tablet Take 1 tablet (25 mg total) by  mouth daily. 05/20/23  Yes Tonny Bollman, MD  metoprolol tartrate (LOPRESSOR) 100 MG tablet Take 1 tablet (100 mg total) by mouth 2 (two) times daily. 11/02/22  Yes Tonny Bollman, MD  nitroGLYCERIN (NITROSTAT) 0.4 MG SL tablet Place 1 tablet (0.4 mg total) under the tongue every 5 (five) minutes as needed for chest pain. 06/03/22  Yes Tonny Bollman, MD  OXYGEN Inhale into the lungs. Per patient using 2 liters at night.   Yes [provider]  prochlorperazine (COMPAZINE) 10 MG tablet Take 1 tablet (10 mg total) by mouth every 8 (eight) hours as needed for nausea or vomiting. 11/25/21  Yes Margaree Mackintosh, MD  rosuvastatin (CRESTOR) 20 MG tablet TAKE 1 TABLET(20 MG) BY MOUTH DAILY 06/15/23  Yes Tonny Bollman, MD  clotrimazole-betamethasone (LOTRISONE) cream Apply 1 Application topically daily. 09/06/23   Margaree Mackintosh, MD  metFORMIN (GLUCOPHAGE) 500 MG tablet One tab by mouth daily with a meal Patient not taking: Reported on 09/20/2023 09/06/23   Margaree Mackintosh, MD   DG Shoulder Right Port  Result Date: 09/20/2023 CLINICAL DATA:  Fall pain EXAM: RIGHT SHOULDER - 2 VIEW COMPARISON:  None Available. FINDINGS: There is no evidence of fracture or dislocation. Acromioclavicular degenerative change with narrowing and osteophyte formation. IMPRESSION: Acromioclavicular degenerative changes. No acute osseous abnormalities. Electronically Signed   By: Layla Maw M.D.   On: 09/20/2023 19:38   DG Hip Unilat With Pelvis 2-3 Views Right  Result Date: 09/20/2023 CLINICAL DATA:  Fall pain EXAM: DG HIP (WITH OR WITHOUT PELVIS) 3V RIGHT COMPARISON:  None Available. FINDINGS: Comminuted intertrochanteric fracture proximal right femur. Lesser trochanter fragment displaced by up to 1.5 cm. Bilateral hip osteoarthritis with joint space narrowing and osteophytes. Prior left hip ORIF. No osteolytic or osteoblastic changes. Pelvic ring intact. IMPRESSION: Comminuted displaced right proximal femoral  intertrochanteric fracture. Electronically Signed   By: Layla Maw M.D.   On: 09/20/2023 19:38   DG Chest Port 1 View  Result Date: 09/20/2023 CLINICAL DATA:  Fall. EXAM: PORTABLE CHEST 1 VIEW COMPARISON:  07/10/2021. FINDINGS: Unremarkable cardiac silhouette. Calcified aorta. No pneumothorax or pleural effusion. Osseous structures appear grossly intact and are not well evaluated with chest x-ray. Unremarkable pulmonary vasculature. Vague left basilar opacity could represent an early infectious process versus chronic scarring. IMPRESSION: Vague/early process at the left base consistent with edema, volume loss or scarring. Otherwise no acute cardiopulmonary process. Electronically Signed   By: Layla Maw M.D.   On: 09/20/2023 19:36    Positive ROS: All other systems have been reviewed and were otherwise negative with the exception of those mentioned in the HPI and as above.  Labs cbc Recent Labs    09/20/23 1912 09/21/23 0448  WBC 6.5 6.4  HGB 13.4 11.4*  HCT 42.1 35.9*  PLT 164 151    Labs inflam No results for input(s): "CRP" in the last 72 hours.  Invalid input(s): "ESR"  Labs coag No results for input(s): "INR", "PTT" in the last 72 hours.  Invalid input(s): "PT"  Recent Labs    09/20/23 1912 09/21/23 0448  NA 138 135  K 2.6* 3.4*  CL 93* 92*  CO2 33* 33*  GLUCOSE 186* 149*  BUN 9 11  CREATININE 0.89 0.79  CALCIUM 8.9 8.3*    Physical Exam: Vitals:   09/20/23 2318 09/21/23 0546  BP: 136/73 130/64  Pulse: 81 75  Resp: 20 18  Temp: 98.5 F (36.9 C) 98.2 F (36.8 C)  SpO2: 98% 97%   General: Alert, no acute distress Cardiovascular: No pedal edema Respiratory: No cyanosis, no use of accessory musculature GI: No organomegaly, abdomen is soft and non-tender Skin: No lesions in the area of chief complaint other than those listed below in MSK exam.  Neurologic: Sensation intact distally save for the below mentioned MSK exam Psychiatric: Patient is  competent for consent with normal mood and affect Lymphatic: No axillary or cervical lymphadenopathy  MUSCULOSKELETAL:  RLE: pain at the hip, NVI, compartments soft Other extremities are atraumatic with painless ROM and NVI.  Assessment: R hip intertroch fracture  Plan: I discussed risks and benefits of R  hip nail fixation with her and family. They wish to proceed.    Sheral Apley, MD    09/21/2023 8:49 AM

## 2023-09-21 NOTE — TOC Initial Note (Signed)
Transition of Care [facility]) - Initial/Assessment Note   Patient Details  Name: Carrie Chung MRN: [id number] Date of Birth: 1941/09/27  Transition of Care [facility]) CM/SW Contact:    Ewing Schlein, LCSW Phone Number: 09/21/2023, 11:43 AM  Clinical Narrative: [facility] consulted for possible rehab needs. Patient scheduled for surgery today. Awaiting PT recommendations following surgery.                 Expected Discharge Plan:  (TBD) Barriers to Discharge: Continued Medical Work up  Expected Discharge Plan and Services In-house Referral: Clinical Social Work Living arrangements for the past 2 months: Single Family Home  Prior Living Arrangements/Services Living arrangements for the past 2 months: Single Family Home Patient language and need for interpreter reviewed:: Yes Do you feel safe going back to the place where you live?: Yes      Need for Family Participation in Patient Care: No (Comment) Care giver support system in place?: Yes (comment) Criminal Activity/Legal Involvement Pertinent to Current Situation/Hospitalization: No - Comment as needed  Activities of Daily Living ADL Screening (condition at time of admission) Does the patient have a NEW difficulty with bathing/dressing/toileting/self-feeding that is expected to last >3 days?: Yes (Initiates electronic notice to provider for possible OT consult) Does the patient have a NEW difficulty with getting in/out of bed, walking, or climbing stairs that is expected to last >3 days?: Yes (Initiates electronic notice to provider for possible PT consult) Does the patient have a NEW difficulty with communication that is expected to last >3 days?: No Is the patient deaf or have difficulty hearing?: No Does the patient have difficulty seeing, even when wearing glasses/contacts?: No Does the patient have difficulty concentrating, remembering, or making decisions?: No  Emotional Assessment Orientation: : Oriented to Self, Oriented to Place,  Oriented to  Time, Oriented to Situation Alcohol / Substance Use: Not Applicable Psych Involvement: No (comment)  Admission diagnosis:  Closed fracture of right hip, initial encounter (HCC) [S72.001A] Closed right hip fracture, initial encounter (HCC) [S72.001A] Patient Active Problem List   Diagnosis Date Noted   Closed right hip fracture, initial encounter (HCC) 09/20/2023   Pain due to onychomycosis of toenails of both feet 08/22/2021   Intertrochanteric fracture of left hip (HCC) 06/24/2021   Closed left hip fracture, initial encounter (HCC) 06/20/2021   Hypokalemia 06/20/2021   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   Cognitive complaints 06/02/2016   Hypoxemic respiratory failure, chronic (HCC) 09/19/2015   MCI (mild cognitive impairment) 03/06/2015   Depression with anxiety 03/06/2015   Bullae 07/02/2014   History of MRSA infection 11/20/2012   Low back pain 07/07/2012   Insomnia 04/28/2012   Coronary Artery Disease 04/19/2012   Hyperlipidemia 04/19/2012   Anxiety 04/19/2012   Non-ST elevation myocardial infarction (NSTEMI), initial care episode (HCC) 03/28/2012   HYPERTENSION, BENIGN 06/12/2010   Dyspnea 06/12/2010   Prolonged QT interval 06/12/2010   PCP:  Margaree Mackintosh, MD Pharmacy:   [facility] [phone] [place], [place] - [address] AT [facility] [address] [facility] [address] [place] [address] Phone: [phone] Fax: [phone]  Social Determinants of Health (SDOH) Social History: SDOH Screenings   Food Insecurity: No Food Insecurity (09/20/2023)  Housing: Low Risk  (09/20/2023)  Transportation Needs: No Transportation Needs (09/20/2023)  Utilities: Not At Risk (09/20/2023)  Alcohol Screen: Low Risk  (09/03/2021)  Depression (PHQ2-9): Low Risk  (03/05/2023)  Financial Resource Strain: Low Risk  (09/03/2021)  Physical Activity: Inactive (09/03/2021)  Social Connections: Socially Integrated (09/03/2021)  Stress: No Stress Concern Present (09/03/2021)   Tobacco Use: Medium Risk (09/20/2023)  Health Literacy: Adequate Health Literacy (09/06/2023)   SDOH Interventions:    Readmission Risk Interventions     No data to display

## 2023-09-21 NOTE — Anesthesia Postprocedure Evaluation (Signed)
Anesthesia Post Note  Patient: Hudsyn C Umstead  Procedure(s) Performed: INTRAMEDULLARY (IM) NAIL FEMORAL (Right)     Patient location during evaluation: PACU Anesthesia Type: General Level of consciousness: confused Pain management: pain level controlled Vital Signs Assessment: post-procedure vital signs reviewed and stable Respiratory status: spontaneous breathing, nonlabored ventilation, respiratory function stable and patient connected to nasal cannula oxygen Cardiovascular status: blood pressure returned to baseline and stable Postop Assessment: no apparent nausea or vomiting Anesthetic complications: no  No notable events documented.  Last Vitals:  Vitals:   09/21/23 1745 09/21/23 1752  BP: 119/63   Pulse: 71   Resp: 13 13  Temp:    SpO2: 94%     Last Pain:  Vitals:   09/21/23 1745  TempSrc:   PainSc: 0-No pain    LLE Motor Response: Purposeful movement;Responds to commands (09/21/23 1745)   RLE Motor Response: Purposeful movement;Responds to commands (09/21/23 1745)        Jujuan Dugo,W. EDMOND

## 2023-09-21 NOTE — Interval H&P Note (Signed)
History and Physical Interval Note:  09/21/2023 8:51 AM  Carrie Chung  has presented today for surgery, with the diagnosis of right hip fracture.  The various methods of treatment have been discussed with the patient and family. After consideration of risks, benefits and other options for treatment, the patient has consented to  Procedure(s): INTRAMEDULLARY (IM) NAIL FEMORAL (Right) as a surgical intervention.  The patient's history has been reviewed, patient examined, no change in status, stable for surgery.  I have reviewed the patient's chart and labs.  Questions were answered to the patient's satisfaction.     Sheral Apley

## 2023-09-21 NOTE — Progress Notes (Signed)
Patient transferred from PACU on 2L of O2. Patient has a fat affect, pulling at IVs and O2 tubing. Alert x1(place)

## 2023-09-21 NOTE — Anesthesia Procedure Notes (Signed)
Procedure Name: Intubation Date/Time: 09/21/2023 3:40 PM  Performed by: Chinita Pester, CRNAPre-anesthesia Checklist: Patient identified, Emergency Drugs available, Suction available and Patient being monitored Patient Re-evaluated:Patient Re-evaluated prior to induction Oxygen Delivery Method: Circle System Utilized Preoxygenation: Pre-oxygenation with 100% oxygen Induction Type: IV induction Ventilation: Mask ventilation without difficulty Laryngoscope Size: 4 Grade View: Grade II Tube type: Oral Number of attempts: 1 Airway Equipment and Method: Stylet and Oral airway Placement Confirmation: ETT inserted through vocal cords under direct vision, positive ETCO2 and breath sounds checked- equal and bilateral Secured at: 20 cm Tube secured with: Tape Dental Injury: Teeth and Oropharynx as per pre-operative assessment

## 2023-09-21 NOTE — Progress Notes (Signed)
PROGRESS NOTE  Carrie Chung UUV:[id number] DOB: 1941-08-09 DOA: 09/20/2023 PCP: Margaree Mackintosh, MD  HPI/Recap of past 24 hours:  Carrie Chung is a 82 y.o. female with medical history significant of CAD, NSTEMI s/p stents, HTN, HLD.   Pt in to ED with R hip pain after mechanical fall at home.  Pain severe, unable to bear wt.   Also has R shoulder pain.   Denies head trauma, denies LOC.  Fall was due to tripping up over stairs.  POD #0 status post right hip nail fixation on 09/21/2023 by Dr. Eulah Pont.  09/21/23: The patient was seen and examined at bedside.  She has no new complaints.  Pain is well-controlled on current pain management.  Assessment/Plan: Principal Problem:   Closed right hip fracture, initial encounter [facility]) Active Problems:   HYPERTENSION, BENIGN   Coronary Artery Disease   Hypoxemic respiratory failure, chronic (HCC)   Hypokalemia  Closed right hip fracture, initial encounter [facility]), status post right hip nail fixation on 09/21/2023 by Dr. Eulah Pont. Continue pain management   Hypokalemia Repleted orally. Repeat BMP and magnesium level in the morning   Hypoxemic respiratory failure, chronic (HCC) On 2L O2 at bedtime at baseline due to sleep related hypoxia (see pulm notes from Feb) Ordered O2 at bedtime. Maintain O2 saturation above 92%.   Coronary Artery Disease H/o NSTEMI and multiple stents back in 2013. Cont metoprolol, cardizem, crestor. Holding ASA / Plavix for surgery.   HYPERTENSION, BENIGN Cont Metoprolol, cardizem Hold HCTZ Continue to closely monitor vital signs     Time: 55 minutes.  Code Status: Full code  Family Communication: None at bedside  Disposition Plan: Likely will discharge once orthopedic surgery signs off.   Consultants: Orthopedic surgery  Procedures: Right hip repair  Antimicrobials: Cefazolin, perioperative  DVT prophylaxis: Defer chemical DVT prophylaxis to orthopedic surgery.  Status is:  Inpatient Inpatient status.  The patient requires at least 2 midnights for further evaluation and treatment of present condition.    Objective: Vitals:   09/21/23 1730 09/21/23 1745 09/21/23 1752 09/21/23 1800  BP: (!) 116/57 119/63  116/66  Pulse: 67 71  72  Resp: 12 13 13 11   Temp:    98 F (36.7 C)  TempSrc:      SpO2: 100% 94%  91%  Weight:      Height:        Intake/Output Summary (Last 24 hours) at 09/21/2023 1844 Last data filed at 09/21/2023 1610 Gross per 24 hour  Intake 389.58 ml  Output 200 ml  Net 189.58 ml   Filed Weights   09/20/23 1856  Weight: 71.4 kg    Exam:  General: 82 y.o. year-old female well developed well nourished in no acute distress.  Alert and oriented x3. Cardiovascular: Regular rate and rhythm with no rubs or gallops.  No thyromegaly or JVD noted.   Respiratory: Clear to auscultation with no wheezes or rales. Good inspiratory effort. Abdomen: Soft nontender nondistended with normal bowel sounds x4 quadrants. Musculoskeletal: No lower extremity edema. 2/4 pulses in all 4 extremities. Skin: No ulcerative lesions noted or rashes, Psychiatry: Mood is appropriate for condition and setting   Data Reviewed: CBC: Recent Labs  Lab 09/20/23 1912 09/21/23 0448  WBC 6.5 6.4  NEUTROABS 5.1  --   HGB 13.4 11.4*  HCT 42.1 35.9*  MCV 91.9 91.3  PLT 164 151   Basic Metabolic Panel: Recent Labs  Lab 09/20/23 1912 09/21/23 0448  NA 138 135  K 2.6* 3.4*  CL 93* 92*  CO2 33* 33*  GLUCOSE 186* 149*  BUN 9 11  CREATININE 0.89 0.79  CALCIUM 8.9 8.3*   GFR: Estimated Creatinine Clearance: 47 mL/min (by C-G formula based on SCr of 0.79 mg/dL). Liver Function Tests: No results for input(s): "AST", "ALT", "ALKPHOS", "BILITOT", "PROT", "ALBUMIN" in the last 168 hours. No results for input(s): "LIPASE", "AMYLASE" in the last 168 hours. No results for input(s): "AMMONIA" in the last 168 hours. Coagulation Profile: No results for input(s):  "INR", "PROTIME" in the last 168 hours. Cardiac Enzymes: No results for input(s): "CKTOTAL", "CKMB", "CKMBINDEX", "TROPONINI" in the last 168 hours. BNP (last 3 results) No results for input(s): "PROBNP" in the last 8760 hours. HbA1C: No results for input(s): "HGBA1C" in the last 72 hours. CBG: No results for input(s): "GLUCAP" in the last 168 hours. Lipid Profile: No results for input(s): "CHOL", "HDL", "LDLCALC", "TRIG", "CHOLHDL", "LDLDIRECT" in the last 72 hours. Thyroid Function Tests: No results for input(s): "TSH", "T4TOTAL", "FREET4", "T3FREE", "THYROIDAB" in the last 72 hours. Anemia Panel: No results for input(s): "VITAMINB12", "FOLATE", "FERRITIN", "TIBC", "IRON", "RETICCTPCT" in the last 72 hours. Urine analysis:    Component Value Date/Time   COLORURINE STRAW (A) 07/09/2021 2131   APPEARANCEUR CLEAR 07/09/2021 2131   LABSPEC 1.004 (L) 07/09/2021 2131   PHURINE 7.0 07/09/2021 2131   GLUCOSEU NEGATIVE 07/09/2021 2131   HGBUR NEGATIVE 07/09/2021 2131   BILIRUBINUR neg 09/04/2022 1202   KETONESUR NEGATIVE 07/09/2021 2131   PROTEINUR Negative 09/04/2022 1202   PROTEINUR NEGATIVE 07/09/2021 2131   UROBILINOGEN 0.2 09/04/2022 1202   UROBILINOGEN 0.2 05/10/2009 1142   NITRITE neg 09/04/2022 1202   NITRITE NEGATIVE 07/09/2021 2131   LEUKOCYTESUR Negative 09/04/2022 1202   LEUKOCYTESUR NEGATIVE 07/09/2021 2131   Sepsis Labs: @LABRCNTIP (procalcitonin:4,lacticidven:4)  )No results found for this or any previous visit (from the past 240 hour(s)).    Studies: DG HIP UNILAT WITH PELVIS 2-3 VIEWS RIGHT  Result Date: 09/21/2023 CLINICAL DATA:  Elective surgery. EXAM: DG HIP (WITH OR WITHOUT PELVIS) 2-3V RIGHT COMPARISON:  Preoperative imaging. FINDINGS: Nine fluoroscopic spot views of the right hip obtained in the operating room. Femoral intramedullary nail with trans trochanteric and distal locking screw fixation traverse intertrochanteric femur fracture. Fluoroscopy time 35  seconds. Dose 4.87 mGy. IMPRESSION: Intraoperative fluoroscopy during right femoral intramedullary nail fixation of proximal femur fracture. Electronically Signed   By: Narda Rutherford M.D.   On: 09/21/2023 17:13   DG C-Arm 1-60 Min-No Report  Result Date: 09/21/2023 Fluoroscopy was utilized by the requesting physician.  No radiographic interpretation.   DG Shoulder Right Port  Result Date: 09/20/2023 CLINICAL DATA:  Fall pain EXAM: RIGHT SHOULDER - 2 VIEW COMPARISON:  None Available. FINDINGS: There is no evidence of fracture or dislocation. Acromioclavicular degenerative change with narrowing and osteophyte formation. IMPRESSION: Acromioclavicular degenerative changes. No acute osseous abnormalities. Electronically Signed   By: Layla Maw M.D.   On: 09/20/2023 19:38   DG Hip Unilat With Pelvis 2-3 Views Right  Result Date: 09/20/2023 CLINICAL DATA:  Fall pain EXAM: DG HIP (WITH OR WITHOUT PELVIS) 3V RIGHT COMPARISON:  None Available. FINDINGS: Comminuted intertrochanteric fracture proximal right femur. Lesser trochanter fragment displaced by up to 1.5 cm. Bilateral hip osteoarthritis with joint space narrowing and osteophytes. Prior left hip ORIF. No osteolytic or osteoblastic changes. Pelvic ring intact. IMPRESSION: Comminuted displaced right proximal femoral intertrochanteric fracture. Electronically Signed   By: Layla Maw M.D.   On: 09/20/2023 19:38  DG Chest Port 1 View  Result Date: 09/20/2023 CLINICAL DATA:  Fall. EXAM: PORTABLE CHEST 1 VIEW COMPARISON:  07/10/2021. FINDINGS: Unremarkable cardiac silhouette. Calcified aorta. No pneumothorax or pleural effusion. Osseous structures appear grossly intact and are not well evaluated with chest x-ray. Unremarkable pulmonary vasculature. Vague left basilar opacity could represent an early infectious process versus chronic scarring. IMPRESSION: Vague/early process at the left base consistent with edema, volume loss or scarring.  Otherwise no acute cardiopulmonary process. Electronically Signed   By: Layla Maw M.D.   On: 09/20/2023 19:36    Scheduled Meds:  acetaminophen  1,000 mg Oral Q6H   [START ON 09/22/2023] clopidogrel  75 mg Oral Daily   diltiazem  240 mg Oral Daily   docusate sodium  100 mg Oral BID   insulin aspart  0-5 Units Subcutaneous QHS   [START ON 09/22/2023] insulin aspart  0-9 Units Subcutaneous TID WC   metoprolol tartrate  100 mg Oral BID   pantoprazole  80 mg Oral Q1200   potassium chloride  20 mEq Oral BID   rosuvastatin  20 mg Oral Daily   venlafaxine XR  75 mg Oral Q breakfast    Continuous Infusions:   ceFAZolin (ANCEF) IV     methocarbamol (ROBAXIN) IV     tranexamic acid       LOS: 1 day     Darlin Drop, MD [facility] Pager [phone]  If 7PM-7AM, please contact night-coverage [email or web address] Password [facility] 09/21/2023, 6:44 PM

## 2023-09-22 ENCOUNTER — Encounter (HOSPITAL_COMMUNITY): Payer: Self-pay | Admitting: Orthopedic Surgery

## 2023-09-22 DIAGNOSIS — S72001A Fracture of unspecified part of neck of right femur, initial encounter for closed fracture: Secondary | ICD-10-CM | POA: Diagnosis not present

## 2023-09-22 LAB — CBC
HCT: 26.5 % — ABNORMAL LOW (ref 36.0–46.0)
Hemoglobin: 8.3 g/dL — ABNORMAL LOW (ref 12.0–15.0)
MCH: 29 pg (ref 26.0–34.0)
MCHC: 31.3 g/dL (ref 30.0–36.0)
MCV: 92.7 fL (ref 80.0–100.0)
Platelets: 135 10*3/uL — ABNORMAL LOW (ref 150–400)
RBC: 2.86 MIL/uL — ABNORMAL LOW (ref 3.87–5.11)
RDW: 12.9 % (ref 11.5–15.5)
WBC: 6.2 10*3/uL (ref 4.0–10.5)
nRBC: 0 % (ref 0.0–0.2)

## 2023-09-22 LAB — BASIC METABOLIC PANEL
Anion gap: 11 (ref 5–15)
BUN: 14 mg/dL (ref 8–23)
CO2: 30 mmol/L (ref 22–32)
Calcium: 7.9 mg/dL — ABNORMAL LOW (ref 8.9–10.3)
Chloride: 90 mmol/L — ABNORMAL LOW (ref 98–111)
Creatinine, Ser: 0.88 mg/dL (ref 0.44–1.00)
GFR, Estimated: >60 mL/min (ref >60)
Glucose, Bld: 301 mg/dL — ABNORMAL HIGH (ref 70–99)
Potassium: 3.8 mmol/L (ref 3.5–5.1)
Sodium: 131 mmol/L — ABNORMAL LOW (ref 135–145)

## 2023-09-22 LAB — GLUCOSE, CAPILLARY
Glucose-Capillary: 195 mg/dL — ABNORMAL HIGH (ref 70–99)
Glucose-Capillary: 220 mg/dL — ABNORMAL HIGH (ref 70–99)
Glucose-Capillary: 202 mg/dL — ABNORMAL HIGH (ref 70–99)
Glucose-Capillary: 249 mg/dL — ABNORMAL HIGH (ref 70–99)

## 2023-09-22 NOTE — Progress Notes (Signed)
Inpatient Rehab Admissions Coordinator Note:  Per PT recommendations patient was screened for CIR candidacy by Stephania Fragmin, PT. At this time, pt does not appear to demonstrate medical necessity to support a CIR admission.  We will not pursue a rehab consult at this time.  Recommend other rehab venues to be pursued.  Please contact me with any questions.  Stephania Fragmin, [place] [phone] 09/22/23  3:54 PM

## 2023-09-22 NOTE — Op Note (Signed)
DATE OF SURGERY:  09/22/2023  TIME: 7:42 AM  PATIENT NAME:  Carrie Chung  AGE: 82 y.o.  PRE-OPERATIVE DIAGNOSIS:  right hip fracture  POST-OPERATIVE DIAGNOSIS:  SAME  PROCEDURE:  INTRAMEDULLARY (IM) NAIL FEMORAL  SURGEON:  Sheral Apley  ASSISTANT:  Levester Fresh, PA-C, she was present and scrubbed throughout the case, critical for completion in a timely fashion, and for retraction, instrumentation, and closure.   OPERATIVE IMPLANTS: Stryker Gamma Nail  PREOPERATIVE INDICATIONS:  Carrie Chung is a 82 y.o. year old who fell and suffered a hip fracture. She was brought into the ER and then admitted and optimized and then elected for surgical intervention.    The risks benefits and alternatives were discussed with the patient including but not limited to the risks of nonoperative treatment, versus surgical intervention including infection, bleeding, nerve injury, malunion, nonunion, hardware prominence, hardware failure, need for hardware removal, blood clots, cardiopulmonary complications, morbidity, mortality, among others, and they were willing to proceed.    OPERATIVE PROCEDURE:  The patient was brought to the operating room and placed in the supine position. General anesthesia was administered. She was placed on the fracture table.  Closed reduction was performed under C-arm guidance. Time out was then performed after sterile prep and drape. She received preoperative antibiotics.  I first performed a closed manipulation of the fracture to reduce it on the OR table.  Incision was made proximal to the greater trochanter. A guidewire was placed in the appropriate position. Confirmation was made on AP and lateral views. The above-named nail was opened. I opened the proximal femur with a reamer. I then placed the nail by hand easily down. I did not need to ream the femur.  Once the nail was completely seated, I placed a guidepin into the femoral head into the center center  position. I measured the length, and then reamed the lateral cortex and up into the head. I then placed the lag screw. Slight compression was applied. Anatomic fixation achieved.  I then secured the proximal interlocking bolt, and took off a half a turn, and then removed the instruments, and took final C-arm pictures AP and lateral the entire length of the leg.  I then placed a distal interlock screw.   Anatomic reconstruction was achieved, and the wounds were irrigated copiously and closed with a layered closure. The patient was awakened and returned to PACU in stable and satisfactory condition. There no complications and the patient tolerated the procedure well.  She will be weightbearing as tolerated, and will be on chemical px  for a period of four weeks after discharge.   Margarita Rana, M.D.

## 2023-09-22 NOTE — Progress Notes (Signed)
PROGRESS NOTE    Carrie Chung  QMV:[id number] DOB: March 07, 1941 DOA: 09/20/2023 PCP: Margaree Mackintosh, MD    Brief Narrative:  Carrie Chung is a 82 y.o. female with medical history significant of CAD, NSTEMI s/p stents, HTN, HLD.  Pt in to ED with R hip pain after mechanical fall at home.  Pain severe, unable to bear wt.  Also has R shoulder pain.  Denies head trauma, denies LOC.  Fall was due to tripping up over stairs.  POD #1 status post right hip nail fixation on 09/21/2023 by Dr. Eulah Pont.  Assessment & Plan:   Principal Problem:   Closed right hip fracture, initial encounter [facility]) Active Problems:   HYPERTENSION, BENIGN   Coronary Artery Disease   Hypoxemic respiratory failure, chronic (HCC)   Hypokalemia  Closed right hip fracture, initial encounter [facility]), status post right hip nail fixation on 09/21/2023 by Dr. Eulah Pont. Continue pain management   ABLA-hemoglobin dropped to 8.3 from 11 Will follow levels in a.m.  Hypokalemia Resolved  Hypoxemic respiratory failure, chronic (HCC) On 2L O2 at bedtime at baseline due to sleep related hypoxia (see pulm notes from Feb)Ordered O2 at bedtime.Maintain O2 saturation above 92%.   Coronary Artery Disease H/o NSTEMI and multiple stents back in 2013.Cont metoprolol, cardizem, crestor.Holding ASA / Plavix .  Restart when okay with Ortho.   HYPERTENSION, BENIGN Cont Metoprolol, cardizem Hold HCTZ Continue to closely monitor vital signs  Nutrition Problem: Increased nutrient needs Etiology: hip fracture  Signs/Symptoms: estimated needs  Interventions: Refer to RD note for recommendations, MVI  Estimated body mass index is 30.74 kg/m as calculated from the following:   Height as of this encounter: 5' (1.524 m).   Weight as of this encounter: 71.4 kg.  DVT prophylaxis: None  code Status: Full Family Communication: None at bedside  disposition Plan:  Status is: Inpatient Remains inpatient appropriate because: Hip  fracture   Consultants: Ortho  Procedures: None Antimicrobials: None  Subjective: Complains of right hip pain with movement  Objective: Vitals:   09/22/23 0210 09/22/23 1025 09/22/23 1026 09/22/23 1301  BP:  (!) 112/47 (!) 114/55 100/64  Pulse:  71  66  Resp:  17  17  Temp: 97.6 F (36.4 C) 98 F (36.7 C)  (!) 97.5 F (36.4 C)  TempSrc:  Oral  Axillary  SpO2:  100%  100%  Weight:      Height:        Intake/Output Summary (Last 24 hours) at 09/22/2023 1333 Last data filed at 09/22/2023 1316 Gross per 24 hour  Intake 1647.61 ml  Output 580 ml  Net 1067.61 ml   Filed Weights   09/20/23 1856  Weight: 71.4 kg    Examination:  General exam: Appears in no acute distress  respiratory system: Clear to auscultation. Respiratory effort normal. Cardiovascular system: S1 & S2 heard, RRR. No JVD, murmurs, rubs, gallops or clicks. No pedal edema. Gastrointestinal system: Abdomen is nondistended, soft and nontender. No organomegaly or masses felt. Normal bowel sounds heard. Central nervous system: Alert and oriented. No focal neurological deficits. Extremities right incision clean dry intact covered with dressing   Data Reviewed: I have personally reviewed following labs and imaging studies  CBC: Recent Labs  Lab 09/20/23 1912 09/21/23 0448 09/22/23 0433  WBC 6.5 6.4 6.2  NEUTROABS 5.1  --   --   HGB 13.4 11.4* 8.3*  HCT 42.1 35.9* 26.5*  MCV 91.9 91.3 92.7  PLT 164 151 135*   Basic  Metabolic Panel: Recent Labs  Lab 09/20/23 1912 09/21/23 0448 09/22/23 0433  NA 138 135 131*  K 2.6* 3.4* 3.8  CL 93* 92* 90*  CO2 33* 33* 30  GLUCOSE 186* 149* 301*  BUN 9 11 14   CREATININE 0.89 0.79 0.88  CALCIUM 8.9 8.3* 7.9*   GFR: Estimated Creatinine Clearance: 42.7 mL/min (by C-G formula based on SCr of 0.88 mg/dL). Liver Function Tests: No results for input(s): "AST", "ALT", "ALKPHOS", "BILITOT", "PROT", "ALBUMIN" in the last 168 hours. No results for input(s):  "LIPASE", "AMYLASE" in the last 168 hours. No results for input(s): "AMMONIA" in the last 168 hours. Coagulation Profile: No results for input(s): "INR", "PROTIME" in the last 168 hours. Cardiac Enzymes: No results for input(s): "CKTOTAL", "CKMB", "CKMBINDEX", "TROPONINI" in the last 168 hours. BNP (last 3 results) No results for input(s): "PROBNP" in the last 8760 hours. HbA1C: No results for input(s): "HGBA1C" in the last 72 hours. CBG: Recent Labs  Lab 09/21/23 2332 09/22/23 0800 09/22/23 1139  GLUCAP 260* 195* 220*   Lipid Profile: No results for input(s): "CHOL", "HDL", "LDLCALC", "TRIG", "CHOLHDL", "LDLDIRECT" in the last 72 hours. Thyroid Function Tests: No results for input(s): "TSH", "T4TOTAL", "FREET4", "T3FREE", "THYROIDAB" in the last 72 hours. Anemia Panel: No results for input(s): "VITAMINB12", "FOLATE", "FERRITIN", "TIBC", "IRON", "RETICCTPCT" in the last 72 hours. Sepsis Labs: No results for input(s): "PROCALCITON", "LATICACIDVEN" in the last 168 hours.  No results found for this or any previous visit (from the past 240 hour(s)).       Radiology Studies: DG HIP UNILAT WITH PELVIS 2-3 VIEWS RIGHT  Result Date: 09/21/2023 CLINICAL DATA:  Elective surgery. EXAM: DG HIP (WITH OR WITHOUT PELVIS) 2-3V RIGHT COMPARISON:  Preoperative imaging. FINDINGS: Nine fluoroscopic spot views of the right hip obtained in the operating room. Femoral intramedullary nail with trans trochanteric and distal locking screw fixation traverse intertrochanteric femur fracture. Fluoroscopy time 35 seconds. Dose 4.87 mGy. IMPRESSION: Intraoperative fluoroscopy during right femoral intramedullary nail fixation of proximal femur fracture. Electronically Signed   By: Narda Rutherford M.D.   On: 09/21/2023 17:13   DG C-Arm 1-60 Min-No Report  Result Date: 09/21/2023 Fluoroscopy was utilized by the requesting physician.  No radiographic interpretation.   DG Shoulder Right Port  Result Date:  09/20/2023 CLINICAL DATA:  Fall pain EXAM: RIGHT SHOULDER - 2 VIEW COMPARISON:  None Available. FINDINGS: There is no evidence of fracture or dislocation. Acromioclavicular degenerative change with narrowing and osteophyte formation. IMPRESSION: Acromioclavicular degenerative changes. No acute osseous abnormalities. Electronically Signed   By: Layla Maw M.D.   On: 09/20/2023 19:38   DG Hip Unilat With Pelvis 2-3 Views Right  Result Date: 09/20/2023 CLINICAL DATA:  Fall pain EXAM: DG HIP (WITH OR WITHOUT PELVIS) 3V RIGHT COMPARISON:  None Available. FINDINGS: Comminuted intertrochanteric fracture proximal right femur. Lesser trochanter fragment displaced by up to 1.5 cm. Bilateral hip osteoarthritis with joint space narrowing and osteophytes. Prior left hip ORIF. No osteolytic or osteoblastic changes. Pelvic ring intact. IMPRESSION: Comminuted displaced right proximal femoral intertrochanteric fracture. Electronically Signed   By: Layla Maw M.D.   On: 09/20/2023 19:38   DG Chest Port 1 View  Result Date: 09/20/2023 CLINICAL DATA:  Fall. EXAM: PORTABLE CHEST 1 VIEW COMPARISON:  07/10/2021. FINDINGS: Unremarkable cardiac silhouette. Calcified aorta. No pneumothorax or pleural effusion. Osseous structures appear grossly intact and are not well evaluated with chest x-ray. Unremarkable pulmonary vasculature. Vague left basilar opacity could represent an early infectious process versus chronic  scarring. IMPRESSION: Vague/early process at the left base consistent with edema, volume loss or scarring. Otherwise no acute cardiopulmonary process. Electronically Signed   By: Layla Maw M.D.   On: 09/20/2023 19:36        Scheduled Meds:  acetaminophen  1,000 mg Oral Q6H   clopidogrel  75 mg Oral Daily   diltiazem  240 mg Oral Daily   docusate sodium  100 mg Oral BID   insulin aspart  0-5 Units Subcutaneous QHS   insulin aspart  0-9 Units Subcutaneous TID WC   metoprolol tartrate  100 mg  Oral BID   pantoprazole  80 mg Oral Q1200   rosuvastatin  20 mg Oral Daily   venlafaxine XR  75 mg Oral Q breakfast   Continuous Infusions:  methocarbamol (ROBAXIN) IV       LOS: 2 days    Time spent: 38 min  Alwyn Ren, MD 09/22/2023, 1:33 PM

## 2023-09-22 NOTE — Progress Notes (Signed)
Subjective: Patient somewhat loopy this morning from just receiving Oxycodone. She reports pain as moderate.  Tolerating diet.  Urinating.   No CP, SOB.  Hasn't mobilized OOB with PT yet.   Objective:   VITALS:   Vitals:   09/21/23 2121 09/21/23 2231 09/22/23 0202 09/22/23 0210  BP: (!) 103/49 (!) 110/54 (!) 110/55   Pulse: 73 76 71   Resp: 18 18 18    Temp: 98.3 F (36.8 C) (!) 97.4 F (36.3 C)  97.6 F (36.4 C)  TempSrc: Oral Oral    SpO2: 95% 97% 98%   Weight:      Height:          Latest Ref Rng & Units 09/22/2023    4:33 AM 09/21/2023    4:48 AM 09/20/2023    7:12 PM  CBC  WBC 4.0 - 10.5 K/uL 6.2  6.4  6.5   Hemoglobin 12.0 - 15.0 g/dL 8.3  18.2  99.3   Hematocrit 36.0 - 46.0 % 26.5  35.9  42.1   Platelets 150 - 400 K/uL 135  151  164       Latest Ref Rng & Units 09/22/2023    4:33 AM 09/21/2023    4:48 AM 09/20/2023    7:12 PM  BMP  Glucose 70 - 99 mg/dL 716  967  893   BUN 8 - 23 mg/dL 14  11  9    Creatinine 0.44 - 1.00 mg/dL 8.10  1.75  1.02   Sodium 135 - 145 mmol/L 131  135  138   Potassium 3.5 - 5.1 mmol/L 3.8  3.4  2.6   Chloride 98 - 111 mmol/L 90  92  93   CO2 22 - 32 mmol/L 30  33  33   Calcium 8.9 - 10.3 mg/dL 7.9  8.3  8.9    Intake/Output      10/01 0701 10/02 0700 10/02 0701 10/03 0700   P.O. 840    I.V. (mL/kg) 0 (0)    IV Piggyback 207.6    Total Intake(mL/kg) 1047.6 (14.7)    Urine (mL/kg/hr) 280 (0.2)    Stool 0    Blood 100    Total Output 380    Net +667.6         Urine Occurrence 1 x    Stool Occurrence 0 x       Physical Exam: General: NAD.  Sitting up in bed, talkative, almost hyper, hard to keep on topic, making some odd statements Resp: No increased wob Cardio: regular rate and rhythm ABD soft Neurologically intact MSK Neurovascularly intact Sensation intact distally Intact pulses distally Dorsiflexion/Plantar flexion intact Incision: dressing C/D/I   Assessment: 1 Day Post-Op  S/P Procedure(s)  (LRB): INTRAMEDULLARY (IM) NAIL FEMORAL (Right) by Dr. Jewel Baize. Eulah Pont on 09/21/23  Principal Problem:   Closed right hip fracture, initial encounter [facility]) Active Problems:   HYPERTENSION, BENIGN   Coronary Artery Disease   Hypoxemic respiratory failure, chronic (HCC)   Hypokalemia    Plan: Monitor Hgb level as she did bleed a lot during surgery. I put a pressure dressing on her distal incisions.    Advance diet Up with therapy Incentive Spirometry Elevate and Apply ice  Weightbearing: WBAT RLE Insicional and dressing care: Dressings left intact until follow-up and Reinforce dressings as needed Orthopedic device(s): None Showering: Keep dressing dry VTE prophylaxis:  on Plavix at baseline, restarted POD 1, recommend holding if Hgb <7  , SCDs, ambulation Pain control:  PRN, limit narcotics due to age Follow - up plan: 2 weeks Contact information:  Margarita Rana MD, Levester Fresh PA-C  Dispo:  TBD based on PT evaluations.      Jenne Pane, PA-C Office [phone] 09/22/2023, 9:17 AM

## 2023-09-22 NOTE — Evaluation (Signed)
Physical Therapy Evaluation Patient Details Name: Carrie Chung MRN: [id number] DOB: 18-Dec-1941 Today's Date: 09/22/2023  History of Present Illness  82yo F who presented to the ED on 09/21/23 after mechanical fall up stairs at home. Found to have R hip intertrochanteric fracture, received R IM nail 09/21/23. PMH hx Atrial tachycardia, CAD, HTN, HLD, MI, carotid stent, L IM nail placement  Clinical Impression   Pt received in bed, pleasant and cooperative but talkative and tangential- may be her baseline, family not present to confirm. Did need heavy physical assistance as well as use of Stedy to safely transfer from bed to recliner today, incontinent in standing but tolerated transfer well. Will need +2 assist to safely progress to using RW and pre-gait/gait moving forward. Left up in recliner with all needs met, CNA present and attending and chair alarm active. Would really benefit from intensive skilled PT f/u after DC from the hospital.         If plan is discharge home, recommend the following: A lot of help with bathing/dressing/bathroom;A lot of help with walking and/or transfers;Two people to help with bathing/dressing/bathroom;Two people to help with walking and/or transfers;Help with stairs or ramp for entrance;Assist for transportation;Assistance with cooking/housework   Can travel by private vehicle        Equipment Recommendations Other (comment) (defer to next venue)  Recommendations for Other Services       Functional Status Assessment Patient has had a recent decline in their functional status and demonstrates the ability to make significant improvements in function in a reasonable and predictable amount of time.     Precautions / Restrictions Precautions Precautions: Fall Precaution Comments: incontient, bring brief; watch SpO2 Restrictions Weight Bearing Restrictions: No Other Position/Activity Restrictions: WBAT      Mobility  Bed Mobility Overal bed  mobility: Needs Assistance Bed Mobility: Supine to Sit     Supine to sit: Mod assist, HOB elevated, Used rails     General bed mobility comments: ModA/increased time and effort with HOB elevated, needed help supporting R LE as well as rotating hips around to EOB    Transfers Overall transfer level: Needs assistance Equipment used: Ambulation equipment used Transfers: Sit to/from Stand, Bed to chair/wheelchair/BSC Sit to Stand: Mod assist, From elevated surface           General transfer comment: ModA from elevated bed to steady, extra time and effort to clear hips so that stedy pads could be placed under her; only needed MinA to boost to standing from higher steady seat. TotalA pivot to recliner in Scientist, water quality: Stedy  Ambulation/Gait               General Gait Details: unable at eval/will need +2 to progress safely  Stairs            Wheelchair Mobility     Tilt Bed    Modified Rankin (Stroke Patients Only)       Balance Overall balance assessment: History of Falls, Needs assistance Sitting-balance support: Single extremity supported, Feet supported Sitting balance-Leahy Scale: Good     Standing balance support: Bilateral upper extremity supported, Reliant on assistive device for balance Standing balance-Leahy Scale: Poor                               Pertinent Vitals/Pain Pain Assessment Pain Assessment: 0-10 Pain Score: 2  Pain Location: surgical hip at rest, can get to 5/10  with coughing or movement    Home Living Family/patient expects to be discharged to:: Private residence Living Arrangements: Spouse/significant other (husband has dementia) Available Help at Discharge: Personal care attendant;Other (Comment) (has home aides 5 days per week, considering increasing frequency due to new hip fracture and husband with dementia) Type of Home: House Home Access: Stairs to enter Entrance Stairs-Rails:  None Entrance Stairs-Number of Steps: 3 in front, 4 in garage, now has ramp in the front too but usually uses garage entrance Alternate Level Stairs-Number of Steps: 14 Home Layout: Two level;Bed/bath upstairs;Other (Comment) (set up to be able to stay on first level) Home Equipment: Rolling Walker (2 wheels);Hand held shower head;Cane - single point;Crutches Additional Comments: downstairs does not have walk in shower, has to scoot into tub using shower bench downstairs    Prior Function Prior Level of Function : Independent/Modified Independent             Mobility Comments: was doing everything with just set up assist from home aides prior to this new fracture/surgery       Extremity/Trunk Assessment   Upper Extremity Assessment Upper Extremity Assessment: Defer to OT evaluation    Lower Extremity Assessment Lower Extremity Assessment: Generalized weakness    Cervical / Trunk Assessment Cervical / Trunk Assessment: Kyphotic  Communication   Communication Communication: No apparent difficulties Cueing Techniques: Verbal cues  Cognition Arousal: Alert Behavior During Therapy: WFL for tasks assessed/performed Overall Cognitive Status: Within Functional Limits for tasks assessed                                 General Comments: very talkative and tangential, possibly at baseline but no family present to clarify        General Comments      Exercises     Assessment/Plan    PT Assessment Patient needs continued PT services  PT Problem List Decreased strength;Obesity;Decreased knowledge of use of DME;Decreased safety awareness;Decreased activity tolerance;Decreased balance;Pain;Decreased mobility;Decreased coordination       PT Treatment Interventions DME instruction;Gait training;Patient/family education;Stair training;Wheelchair mobility training;Functional mobility training;Therapeutic activities;Therapeutic exercise;Balance training    PT Goals  (Current goals can be found in the Care Plan section)  Acute Rehab PT Goals Patient Stated Goal: go to [facility] at [facility] PT Goal Formulation: With patient Time For Goal Achievement: 10/06/23 Potential to Achieve Goals: Good    Frequency Min 1X/week     Co-evaluation               AM-PAC PT "6 Clicks" Mobility  Outcome Measure Help needed turning from your back to your side while in a flat bed without using bedrails?: A Little Help needed moving from lying on your back to sitting on the side of a flat bed without using bedrails?: A Lot Help needed moving to and from a bed to a chair (including a wheelchair)?: A Lot Help needed standing up from a chair using your arms (e.g., wheelchair or bedside chair)?: A Lot Help needed to walk in hospital room?: Total Help needed climbing 3-5 steps with a railing? : Total 6 Click Score: 11    End of Session Equipment Utilized During Treatment: Gait belt Activity Tolerance: Patient tolerated treatment well Patient left: in chair;with call bell/phone within reach;with chair alarm set (CNA present and attending) Nurse Communication: Mobility status;Need for lift equipment;Weight bearing status PT Visit Diagnosis: Unsteadiness on feet (R26.81);Difficulty in walking, not elsewhere classified (  R26.2);Muscle weakness (generalized) (M62.81);Repeated falls (R29.6);Pain Pain - Right/Left: Right Pain - part of body: Hip    Time: 7846-9629 PT Time Calculation (min) (ACUTE ONLY): 51 min   Charges:   PT Evaluation $PT Eval Low Complexity: 1 Low PT Treatments $Therapeutic Activity: 23-37 mins PT General Charges $$ ACUTE PT VISIT: 1 Visit        Nedra Hai, PT, DPT 09/22/23 11:57 AM

## 2023-09-22 NOTE — TOC Progression Note (Signed)
Transition of Care [facility]) - Progression Note   Patient Details  Name: Carrie Chung MRN: [id number] Date of Birth: 03/07/1941  Transition of Care [facility]) CM/SW Contact  Ewing Schlein, LCSW Phone Number: 09/22/2023, 1:54 PM  Clinical Narrative: PT evaluation recommended inpatient rehab. Awaiting OT evaluation. TOC to follow.    Expected Discharge Plan: IP Rehab Facility Barriers to Discharge: Continued Medical Work up  Expected Discharge Plan and Services In-house Referral: Clinical Social Work Living arrangements for the past 2 months: Single Family Home            DME Arranged: N/A DME Agency: NA  Social Determinants of Health (SDOH) Interventions SDOH Screenings   Food Insecurity: No Food Insecurity (09/20/2023)  Housing: Low Risk  (09/20/2023)  Transportation Needs: No Transportation Needs (09/20/2023)  Utilities: Not At Risk (09/20/2023)  Alcohol Screen: Low Risk  (09/03/2021)  Depression (PHQ2-9): Low Risk  (03/05/2023)  Financial Resource Strain: Low Risk  (09/03/2021)  Physical Activity: Inactive (09/03/2021)  Social Connections: Socially Integrated (09/03/2021)  Stress: No Stress Concern Present (09/03/2021)  Tobacco Use: Medium Risk (09/20/2023)  Health Literacy: Adequate Health Literacy (09/06/2023)   Readmission Risk Interventions     No data to display

## 2023-09-23 DIAGNOSIS — S72001A Fracture of unspecified part of neck of right femur, initial encounter for closed fracture: Secondary | ICD-10-CM | POA: Diagnosis not present

## 2023-09-23 LAB — COMPREHENSIVE METABOLIC PANEL
ALT: 35 U/L (ref 0–44)
AST: 40 U/L (ref 15–41)
Albumin: 2.9 g/dL — ABNORMAL LOW (ref 3.5–5.0)
Alkaline Phosphatase: 79 U/L (ref 38–126)
Anion gap: 12 (ref 5–15)
BUN: 15 mg/dL (ref 8–23)
CO2: 33 mmol/L — ABNORMAL HIGH (ref 22–32)
Calcium: 8.6 mg/dL — ABNORMAL LOW (ref 8.9–10.3)
Chloride: 93 mmol/L — ABNORMAL LOW (ref 98–111)
Creatinine, Ser: 0.73 mg/dL (ref 0.44–1.00)
GFR, Estimated: >60 mL/min (ref >60)
Glucose, Bld: 190 mg/dL — ABNORMAL HIGH (ref 70–99)
Potassium: 3.9 mmol/L (ref 3.5–5.1)
Sodium: 138 mmol/L (ref 135–145)
Total Bilirubin: 0.3 mg/dL (ref 0.3–1.2)
Total Protein: 6 g/dL — ABNORMAL LOW (ref 6.5–8.1)

## 2023-09-23 LAB — CBC
HCT: 23.3 % — ABNORMAL LOW (ref 36.0–46.0)
Hemoglobin: 7.5 g/dL — ABNORMAL LOW (ref 12.0–15.0)
MCH: 29.5 pg (ref 26.0–34.0)
MCHC: 32.2 g/dL (ref 30.0–36.0)
MCV: 91.7 fL (ref 80.0–100.0)
Platelets: 177 10*3/uL (ref 150–400)
RBC: 2.54 MIL/uL — ABNORMAL LOW (ref 3.87–5.11)
RDW: 13.2 % (ref 11.5–15.5)
WBC: 10.1 10*3/uL (ref 4.0–10.5)
nRBC: 0.2 % (ref 0.0–0.2)

## 2023-09-23 LAB — PREPARE RBC (CROSSMATCH)

## 2023-09-23 LAB — GLUCOSE, CAPILLARY
Glucose-Capillary: 184 mg/dL — ABNORMAL HIGH (ref 70–99)
Glucose-Capillary: 172 mg/dL — ABNORMAL HIGH (ref 70–99)
Glucose-Capillary: 166 mg/dL — ABNORMAL HIGH (ref 70–99)
Glucose-Capillary: 118 mg/dL — ABNORMAL HIGH (ref 70–99)

## 2023-09-23 MED ORDER — SODIUM CHLORIDE 0.9% IV SOLUTION: Freq: Once | INTRAVENOUS | Status: AC

## 2023-09-23 MED ORDER — HYDROMORPHONE HCL 1 MG/ML IJ SOLN: 0.5000 mg | Freq: Four times a day (QID) | INTRAMUSCULAR | Status: DC | PRN

## 2023-09-23 NOTE — Plan of Care (Signed)
  Problem: Education: Goal: Knowledge of General Education information will improve Description: Including pain rating scale, medication(s)/side effects and non-pharmacologic comfort measures Outcome: Progressing   Problem: Health Behavior/Discharge Planning: Goal: Ability to manage health-related needs will improve Outcome: Progressing   Problem: Clinical Measurements: Goal: Ability to maintain clinical measurements within normal limits will improve Outcome: Progressing Goal: Will remain free from infection Outcome: Progressing Goal: Diagnostic test results will improve Outcome: Progressing Goal: Respiratory complications will improve Outcome: Progressing Goal: Cardiovascular complication will be avoided Outcome: Progressing   Problem: Activity: Goal: Risk for activity intolerance will decrease Outcome: Progressing   Problem: Nutrition: Goal: Adequate nutrition will be maintained Outcome: Progressing   Problem: Coping: Goal: Level of anxiety will decrease Outcome: Progressing   Problem: Elimination: Goal: Will not experience complications related to bowel motility Outcome: Progressing Goal: Will not experience complications related to urinary retention Outcome: Progressing   Problem: Pain Managment: Goal: General experience of comfort will improve Outcome: Progressing   Problem: Safety: Goal: Ability to remain free from injury will improve Outcome: Progressing   Problem: Skin Integrity: Goal: Risk for impaired skin integrity will decrease Outcome: Progressing   Problem: Education: Goal: Verbalization of understanding the information provided (i.e., activity precautions, restrictions, etc) will improve Outcome: Progressing Goal: Individualized Educational Video(s) Outcome: Progressing   Problem: Activity: Goal: Ability to ambulate and perform ADLs will improve Outcome: Progressing   Problem: Clinical Measurements: Goal: Postoperative complications will be  avoided or minimized Outcome: Progressing   Problem: Self-Concept: Goal: Ability to maintain and perform role responsibilities to the fullest extent possible will improve Outcome: Progressing   Problem: Pain Management: Goal: Pain level will decrease Outcome: Progressing   Problem: Education: Goal: Ability to describe self-care measures that may prevent or decrease complications (Diabetes Survival Skills Education) will improve Outcome: Progressing Goal: Individualized Educational Video(s) Outcome: Progressing   Problem: Coping: Goal: Ability to adjust to condition or change in health will improve Outcome: Progressing   Problem: Fluid Volume: Goal: Ability to maintain a balanced intake and output will improve Outcome: Progressing   Problem: Health Behavior/Discharge Planning: Goal: Ability to identify and utilize available resources and services will improve Outcome: Progressing Goal: Ability to manage health-related needs will improve Outcome: Progressing   Problem: Metabolic: Goal: Ability to maintain appropriate glucose levels will improve Outcome: Progressing   Problem: Nutritional: Goal: Maintenance of adequate nutrition will improve Outcome: Progressing Goal: Progress toward achieving an optimal weight will improve Outcome: Progressing   Problem: Skin Integrity: Goal: Risk for impaired skin integrity will decrease Outcome: Progressing   Problem: Tissue Perfusion: Goal: Adequacy of tissue perfusion will improve Outcome: Progressing   

## 2023-09-23 NOTE — Progress Notes (Signed)
Inpatient Rehab Admissions Coordinator:   Dr. Jerolyn Center requested we rescreen on behalf of patient request.  I reviewed case with Dr. Riley Kill and he feels that with her ongoing ABLA, requiring transfusion today, CAD, and DM management we can argue medical necessity for CIR. I will place an order for rehab consult.   Estill Dooms, PT, DPT Admissions Coordinator [phone] 09/23/23  9:33 AM

## 2023-09-23 NOTE — Progress Notes (Signed)
PROGRESS NOTE    Carrie MANTE  Chung:[id number] DOB: 22-Oct-1941 DOA: 09/20/2023 PCP: Margaree Mackintosh, MD    Brief Narrative:  Carrie Chung is a 82 y.o. female with medical history significant of CAD, NSTEMI s/p stents, HTN, HLD.  Pt in to ED with R hip pain after mechanical fall at home.  Pain severe, unable to bear wt.  Also has R shoulder pain.  Denies head trauma, denies LOC.  Fall was due to tripping up over stairs. status post right hip nail fixation on 09/21/2023 by Dr. Eulah Pont.  Assessment & Plan:   Principal Problem:   Closed right hip fracture, initial encounter [facility]) Active Problems:   HYPERTENSION, BENIGN   Coronary Artery Disease   Hypoxemic respiratory failure, chronic (HCC)   Hypokalemia  Closed right hip fracture, initial encounter [facility]), status post right hip nail fixation on 09/21/2023 by Dr. Eulah Pont. Continue pain management with oxy  DECREASE DILAUDID   ABLA-Ortho notes reviewed patient lost a lot of blood during surgery will monitor hemoglobin closely. hemoglobin dropped to 7.3 from  8.3 from 11 Transfuse 1 unit PRBC  DC Plavix  Hypokalemia Resolved  Hypoxemic respiratory failure, chronic (HCC) On 2L O2 at bedtime at baseline due to sleep related hypoxia (see pulm notes from Feb)Ordered O2 at bedtime.Maintain O2 saturation above 92%.   Coronary Artery Disease H/o NSTEMI and multiple stents back in 2013.Cont metoprolol, cardizem, crestor.Holding ASA / Plavix .  Restart when okay with Ortho.   HYPERTENSION, BENIGN Cont Metoprolol, cardizem Hold HCTZ Continue to closely monitor vital signs  Nutrition Problem: Increased nutrient needs Etiology: hip fracture  Signs/Symptoms: estimated needs  Interventions: Refer to RD note for recommendations, MVI  Estimated body mass index is 30.74 kg/m as calculated from the following:   Height as of this encounter: 5' (1.524 m).   Weight as of this encounter: 71.4 kg.  DVT prophylaxis: None  code  Status: Full Family Communication: None at bedside  disposition Plan:  Status is: Inpatient Remains inpatient appropriate because: Hip fracture   Consultants: Ortho  Procedures: None Antimicrobials: None  Subjective: Patient reports severe pain to the right hip with movement ambulated with PT yesterday recommending CIR appreciate CIR consulted Objective: Vitals:   09/22/23 1026 09/22/23 1301 09/22/23 2204 09/23/23 0637  BP: (!) 114/55 100/64 (!) 129/58 (!) 124/52  Pulse:  66 91 81  Resp:  17 18 18   Temp:  (!) 97.5 F (36.4 C) 98.2 F (36.8 C) 98.5 F (36.9 C)  TempSrc:  Axillary Oral Oral  SpO2:  100% 96% 97%  Weight:      Height:        Intake/Output Summary (Last 24 hours) at 09/23/2023 1103 Last data filed at 09/22/2023 1820 Gross per 24 hour  Intake 840 ml  Output 700 ml  Net 140 ml   Filed Weights   09/20/23 1856  Weight: 71.4 kg    Examination:  General exam: Appears in no acute distress  respiratory system: Clear to auscultation. Respiratory effort normal. Cardiovascular system: S1 & S2 heard, RRR. No JVD, murmurs, rubs, gallops or clicks. No pedal edema. Gastrointestinal system: Abdomen is nondistended, soft and nontender. No organomegaly or masses felt. Normal bowel sounds heard. Central nervous system: Alert and oriented. No focal neurological deficits. Extremities right incision clean dry intact covered with dressing   Data Reviewed: I have personally reviewed following labs and imaging studies  CBC: Recent Labs  Lab 09/20/23 1912 09/21/23 0448 09/22/23 1660 09/23/23 6301  WBC 6.5 6.4 6.2 10.1  NEUTROABS 5.1  --   --   --   HGB 13.4 11.4* 8.3* 7.5*  HCT 42.1 35.9* 26.5* 23.3*  MCV 91.9 91.3 92.7 91.7  PLT 164 151 135* 177   Basic Metabolic Panel: Recent Labs  Lab 09/20/23 1912 09/21/23 0448 09/22/23 0433 09/23/23 0427  NA 138 135 131* 138  K 2.6* 3.4* 3.8 3.9  CL 93* 92* 90* 93*  CO2 33* 33* 30 33*  GLUCOSE 186* 149* 301* 190*  BUN  9 11 14 15   CREATININE 0.89 0.79 0.88 0.73  CALCIUM 8.9 8.3* 7.9* 8.6*   GFR: Estimated Creatinine Clearance: 47 mL/min (by C-G formula based on SCr of 0.73 mg/dL). Liver Function Tests: Recent Labs  Lab 09/23/23 0427  AST 40  ALT 35  ALKPHOS 79  BILITOT 0.3  PROT 6.0*  ALBUMIN 2.9*   No results for input(s): "LIPASE", "AMYLASE" in the last 168 hours. No results for input(s): "AMMONIA" in the last 168 hours. Coagulation Profile: No results for input(s): "INR", "PROTIME" in the last 168 hours. Cardiac Enzymes: No results for input(s): "CKTOTAL", "CKMB", "CKMBINDEX", "TROPONINI" in the last 168 hours. BNP (last 3 results) No results for input(s): "PROBNP" in the last 8760 hours. HbA1C: No results for input(s): "HGBA1C" in the last 72 hours. CBG: Recent Labs  Lab 09/22/23 0800 09/22/23 1139 09/22/23 1632 09/22/23 2201 09/23/23 0730  GLUCAP 195* 220* 202* 249* 184*   Lipid Profile: No results for input(s): "CHOL", "HDL", "LDLCALC", "TRIG", "CHOLHDL", "LDLDIRECT" in the last 72 hours. Thyroid Function Tests: No results for input(s): "TSH", "T4TOTAL", "FREET4", "T3FREE", "THYROIDAB" in the last 72 hours. Anemia Panel: No results for input(s): "VITAMINB12", "FOLATE", "FERRITIN", "TIBC", "IRON", "RETICCTPCT" in the last 72 hours. Sepsis Labs: No results for input(s): "PROCALCITON", "LATICACIDVEN" in the last 168 hours.  No results found for this or any previous visit (from the past 240 hour(s)).       Radiology Studies: DG HIP UNILAT WITH PELVIS 2-3 VIEWS RIGHT  Result Date: 09/21/2023 CLINICAL DATA:  Elective surgery. EXAM: DG HIP (WITH OR WITHOUT PELVIS) 2-3V RIGHT COMPARISON:  Preoperative imaging. FINDINGS: Nine fluoroscopic spot views of the right hip obtained in the operating room. Femoral intramedullary nail with trans trochanteric and distal locking screw fixation traverse intertrochanteric femur fracture. Fluoroscopy time 35 seconds. Dose 4.87 mGy. IMPRESSION:  Intraoperative fluoroscopy during right femoral intramedullary nail fixation of proximal femur fracture. Electronically Signed   By: Narda Rutherford M.D.   On: 09/21/2023 17:13   DG C-Arm 1-60 Min-No Report  Result Date: 09/21/2023 Fluoroscopy was utilized by the requesting physician.  No radiographic interpretation.        Scheduled Meds:  sodium chloride   Intravenous Once   clopidogrel  75 mg Oral Daily   diltiazem  240 mg Oral Daily   docusate sodium  100 mg Oral BID   insulin aspart  0-5 Units Subcutaneous QHS   insulin aspart  0-9 Units Subcutaneous TID WC   metoprolol tartrate  100 mg Oral BID   pantoprazole  80 mg Oral Q1200   rosuvastatin  20 mg Oral Daily   venlafaxine XR  75 mg Oral Q breakfast   Continuous Infusions:  methocarbamol (ROBAXIN) IV       LOS: 3 days    Time spent: 38 min  Alwyn Ren, MD 09/23/2023, 11:03 AM

## 2023-09-23 NOTE — Progress Notes (Signed)
Physical Therapy Treatment Patient Details Name: Carrie Chung MRN: [id number] DOB: 1941-04-02 Today's Date: 09/23/2023   History of Present Illness 82yo F who presented to the ED on 09/21/23 after mechanical fall up stairs at home. Found to have R hip intertrochanteric fracture, received R IM nail 09/21/23. PMH hx Atrial tachycardia, CAD, HTN, HLD, MI, carotid stent, L IM nail placement    PT Comments  Pt received in bed, pleasant and cooperative this morning; daughter present and reports that cognition is not quite at baseline. Able to progress really well this morning- stood with RW and took several steps over to recliner with MinAx2. SPO2 VSS on 2LPM, still SOB with activity and needed cues for PLB instead of mouth breathing. Positioned to comfort in recliner with all needs met, alarm active. Continue to recommend AIR setting.     If plan is discharge home, recommend the following: A lot of help with bathing/dressing/bathroom;A lot of help with walking and/or transfers;Two people to help with bathing/dressing/bathroom;Two people to help with walking and/or transfers;Help with stairs or ramp for entrance;Assist for transportation;Assistance with cooking/housework   Can travel by private vehicle        Equipment Recommendations  Other (comment) (defer to next venue)    Recommendations for Other Services       Precautions / Restrictions Precautions Precautions: Fall Precaution Comments: incontient, bring brief; watch SpO2 Restrictions Weight Bearing Restrictions: No Other Position/Activity Restrictions: WBAT     Mobility  Bed Mobility Overal bed mobility: Needs Assistance Bed Mobility: Supine to Sit     Supine to sit: Min assist, +2 for physical assistance, HOB elevated     General bed mobility comments: started using leg lifter, got part of the way there, then MinAx2 to fully get around to midline sitting at EOB    Transfers Overall transfer level: Needs  assistance Equipment used: Rolling walker (2 wheels) Transfers: Sit to/from Stand, Bed to chair/wheelchair/BSC Sit to Stand: Min assist, +2 physical assistance           General transfer comment: MinAx2 to boost to full upright with RW, cues for hand placement and seqeuncing    Ambulation/Gait Ambulation/Gait assistance: Min assist, +2 physical assistance Gait Distance (Feet): 3 Feet Assistive device: Rolling walker (2 wheels) Gait Pattern/deviations: Step-to pattern, Decreased step length - right, Decreased step length - left, Antalgic Gait velocity: decreased     General Gait Details: small steps with RW to get to recliner, mod visual cues for sequencing and turning enough to be able to complete stand to sit transfer into RW safely   Stairs             Wheelchair Mobility     Tilt Bed    Modified Rankin (Stroke Patients Only)       Balance Overall balance assessment: History of Falls, Needs assistance Sitting-balance support: Single extremity supported, Feet supported Sitting balance-Leahy Scale: Good     Standing balance support: Bilateral upper extremity supported, Reliant on assistive device for balance Standing balance-Leahy Scale: Poor                              Cognition Arousal: Alert Behavior During Therapy: WFL for tasks assessed/performed Overall Cognitive Status: Within Functional Limits for tasks assessed  General Comments: very talkative and tangential, possibly from pain meds, daughter reports she is less clear compared to baseline        Exercises      General Comments General comments (skin integrity, edema, etc.): VSS on 2LPM O2      Pertinent Vitals/Pain Pain Assessment Pain Assessment: Faces Faces Pain Scale: Hurts little more Pain Descriptors / Indicators: Discomfort Pain Intervention(s): Limited activity within patient's tolerance, Monitored during session,  Premedicated before session    Home Living Family/patient expects to be discharged to:: Inpatient rehab Living Arrangements: Spouse/significant other                      Prior Function            PT Goals (current goals can now be found in the care plan section) Acute Rehab PT Goals Patient Stated Goal: go to AIR at [facility] PT Goal Formulation: With patient Time For Goal Achievement: 10/06/23 Potential to Achieve Goals: Good Progress towards PT goals: Progressing toward goals    Frequency    Min 1X/week      PT Plan      Co-evaluation              AM-PAC PT "6 Clicks" Mobility   Outcome Measure  Help needed turning from your back to your side while in a flat bed without using bedrails?: A Little Help needed moving from lying on your back to sitting on the side of a flat bed without using bedrails?: A Lot Help needed moving to and from a bed to a chair (including a wheelchair)?: A Lot Help needed standing up from a chair using your arms (e.g., wheelchair or bedside chair)?: A Lot Help needed to walk in hospital room?: Total Help needed climbing 3-5 steps with a railing? : Total 6 Click Score: 11    End of Session Equipment Utilized During Treatment: Gait belt Activity Tolerance: Patient tolerated treatment well Patient left: in chair;with call bell/phone within reach;with chair alarm set;with family/visitor present Nurse Communication: Mobility status PT Visit Diagnosis: Unsteadiness on feet (R26.81);Difficulty in walking, not elsewhere classified (R26.2);Muscle weakness (generalized) (M62.81);Repeated falls (R29.6);Pain Pain - Right/Left: Right Pain - part of body: Hip     Time: 1027-2536 PT Time Calculation (min) (ACUTE ONLY): 24 min  Charges:    $Gait Training: 8-22 mins $Therapeutic Activity: 8-22 mins PT General Charges $$ ACUTE PT VISIT: 1 Visit                    Nedra Hai, PT, DPT 09/23/23 11:47 AM

## 2023-09-23 NOTE — TOC Progression Note (Addendum)
Transition of Care [facility]) - Progression Note    Patient Details  Name: Carrie Chung MRN: [id number] Date of Birth: [birth year]  [facility] CM/SW Contact  Adrian Prows, RN Phone Number: 09/23/2023, 4:01 PM  Clinical Narrative:    Sherron Monday w/ pt and her dtr Shaune Pascal [place]  [phone]) in room; they would like pt to be sent out to other facilities for CIR bed; see note from rehab dated 09/23/23; pt's dtr also wanted info on other options; discussed SNF process, and probable HH options; explained SNF and HH would likely not offer PT services 3 hrs/day; she wanted to know if Medicare would pay for therapy to be supplemented to equal the amount of PT that CIR would offer; she was encouraged to contact Medicare to see what is covered under pt's policy; pt's dtr also given office phone # for contact; she was also notified that her mother's info will be faxed out to [place], [employer], and [employer]; will pass on to oncoming TOC to fax out tomorrow   Expected Discharge Plan: IP Rehab Facility Barriers to Discharge: Continued Medical Work up  Expected Discharge Plan and Services In-house Referral: Clinical Social Work     Living arrangements for the past 2 months: Single Family Home                 DME Arranged: N/A DME Agency: NA                   Social Determinants of Health (SDOH) Interventions SDOH Screenings   Food Insecurity: No Food Insecurity (09/20/2023)  Housing: Low Risk  (09/20/2023)  Transportation Needs: No Transportation Needs (09/20/2023)  Utilities: Not At Risk (09/20/2023)  Alcohol Screen: Low Risk  (09/03/2021)  Depression (PHQ2-9): Low Risk  (03/05/2023)  Financial Resource Strain: Low Risk  (09/03/2021)  Physical Activity: Inactive (09/03/2021)  Social Connections: Socially Integrated (09/03/2021)  Stress: No Stress Concern Present (09/03/2021)  Tobacco Use: Medium Risk (09/20/2023)  Health Literacy: Adequate Health Literacy  (09/06/2023)    Readmission Risk Interventions     No data to display

## 2023-09-23 NOTE — Progress Notes (Signed)
Subjective: Patient reports pain as mild to moderate.  Tolerating diet.  Urinating.   No CP, SOB.  Hasn't mobilized OOB with PT much yet. Only in room. Hasn't tried to walk the hall yet   Objective:   VITALS:   Vitals:   09/23/23 2106 09/24/23 0606 09/24/23 0815 09/24/23 1200  BP: (!) 114/57 (!) 119/52  108/83  Pulse: 74 78  67  Resp: 20 18  18   Temp: 99.8 F (37.7 C) 98.4 F (36.9 C) 99.8 F (37.7 C) 98.3 F (36.8 C)  TempSrc:   Oral Oral  SpO2: 100% 96%  99%  Weight:      Height:          Latest Ref Rng & Units 09/24/2023    9:03 AM 09/23/2023    4:27 AM 09/22/2023    4:33 AM  CBC  WBC 4.0 - 10.5 K/uL 6.9  10.1  6.2   Hemoglobin 12.0 - 15.0 g/dL 8.4  7.5  8.3   Hematocrit 36.0 - 46.0 % 27.5  23.3  26.5   Platelets 150 - 400 K/uL 135  177  135       Latest Ref Rng & Units 09/24/2023    9:03 AM 09/23/2023    4:27 AM 09/22/2023    4:33 AM  BMP  Glucose 70 - 99 mg/dL 962  952  841   BUN 8 - 23 mg/dL 13  15  14    Creatinine 0.44 - 1.00 mg/dL 3.24  4.01  0.27   Sodium 135 - 145 mmol/L 135  138  131   Potassium 3.5 - 5.1 mmol/L 3.6  3.9  3.8   Chloride 98 - 111 mmol/L 96  93  90   CO2 22 - 32 mmol/L 30  33  30   Calcium 8.9 - 10.3 mg/dL 8.2  8.6  7.9    Intake/Output      10/03 0701 10/04 0700 10/04 0701 10/05 0700   P.O. 1320 240   I.V. (mL/kg) 10.5 (0.1) 0 (0)   Blood 290    IV Piggyback 0 0   Total Intake(mL/kg) 1620.5 (22.7) 240 (3.4)   Urine (mL/kg/hr) 500 (0.3)    Stool 0    Total Output 500    Net +1120.5 +240        Urine Occurrence 3 x 1 x   Stool Occurrence 1 x 0 x      Physical Exam: General: NAD.  Laying in bed, calm, comfortable Resp: No increased wob Cardio: regular rate and rhythm ABD soft Neurologically intact MSK Neurovascularly intact Sensation intact distally Intact pulses distally Dorsiflexion/Plantar flexion intact Incision: dressing C/D/I   Assessment: 3 Days Post-Op  S/P Procedure(s) (LRB): INTRAMEDULLARY (IM) NAIL  FEMORAL (Right) by Dr. Jewel Baize. Eulah Pont on 09/21/23  Principal Problem:   Closed right hip fracture, initial encounter [facility]) Active Problems:   HYPERTENSION, BENIGN   Coronary Artery Disease   Hypoxemic respiratory failure, chronic (HCC)   Hypokalemia    Plan: Monitor Hgb level as she did bleed a lot during surgery. I put a pressure dressing on her distal incisions.    Advance diet Up with therapy Incentive Spirometry Elevate and Apply ice  Weightbearing: WBAT RLE Insicional and dressing care: Dressings left intact until follow-up and Reinforce dressings as needed Orthopedic device(s): None Showering: Keep dressing dry VTE prophylaxis:  on Plavix at baseline, recommend holding if Hgb <7  , SCDs, ambulation Pain control: PRN, limit narcotics due to  age Follow - up plan: 2 weeks post op Contact information:  Margarita Rana MD, Levester Fresh PA-C  Dispo:  TBD based on PT evaluations. Patient and family expressed desire to go to CIR but screened for it and denied thus far. Might reconsider once more PT sessions are actually done.      Jenne Pane, PA-C Office [phone] 09/24/2023, 4:09 PM

## 2023-09-23 NOTE — Progress Notes (Signed)
[facility] IP rehab admissions - I called and spoke with patient's daughter Shaune Pascal.  I explained inpatient rehab program here at [facility].  This patient could benefit from inpatient rehab.  Currently rehab beds are full with limited availability this week.  I discussed other options with daughter such as Psychiatrist, [place] and [facility] for rehab.  I will follow up again tomorrow.  Call for questions.  [phone]

## 2023-09-23 NOTE — PMR Pre-admission (Signed)
PMR Admission Coordinator Pre-Admission Assessment  Patient: Carrie Chung is an 82 y.o., female MRN: [id number] DOB: 12-05-1941 Height: 5' (152.4 cm) Weight: 71.4 kg  Insurance Information HMO:     PPO:      PCP:      IPA:      80/20:      OTHER:  PRIMARY: Medicare A and B      Policy#: [id number]      Subscriber: patient CM Name:        Phone#:       Fax#:   Pre-Cert#:        Employer: Retired Benefits:  Phone #:       Name: Checked in passport one source [employer]. Date: 08/21/05     Deduct: $1632      Out of Pocket Max:  none      Life Max: n/z CIR: 100%      SNF: 100 days Outpatient: 80%     Co-Pay: 20% Home Health: 100%      Co-Pay: none DME: 80%     Co-Pay: 20% Providers: patient's choice  SECONDARY: BCBS supplement      Policy#: [id number]     Phone#: [phone]  Financial Counselor:       Phone#:   The "Data Collection Information Summary" for patients in Inpatient Rehabilitation Facilities with attached "Privacy Act Statement-Health Care Records" was provided and verbally reviewed with: Patient  Emergency Contact Information Contact Information     Name Relation Home Work Mobile   Deboard,Camilla Daughter   [phone]      Other Contacts   None on File     Current Medical History  Patient Admitting Diagnosis: R hip fracture  History of Present Illness:  An 82 y.o. female with medical history significant of CAD, NSTEMI s/p stents, HTN, HLD.  Pt in to [facility] ED on 09/20/23  with R hip pain after mechanical fall at home.  Pain severe, unable to bear wt.  Also has R shoulder pain.  Denies head trauma, denies LOC.  Fall was due to tripping up over stairs.  Underwent IM nailing right hip on 09/22/23 by Dr. Margarita Rana.  PT/OT evaluations were completed post op with recommendations for acute inpatient rehab admission.  Patient's medical record from [facility] has been reviewed by the rehabilitation admission coordinator and  physician.  Past Medical History  Past Medical History:  Diagnosis Date   Atrial tachycardia (HCC)    a. noted at cardiac rehab 5/13; event monitor ordered to assess for AFib   CAD (coronary artery disease)    a. NSTEMI 03/2012 ([facility] 03/27/12: pLAD 30%, mLAD 99%, mCFX 95%, mRCA 99% with thrombus, EF 65%);  s/p PTCA/DESx1 to prox-mid RCA 03/27/12 urgently in setting of hypotension/bradycardia, with staged PTCA/Evolve study stent to mid LAD & PTCA/Evolve study stent to prox LCx 03/29/12 ;   echo 03/28/12:EF 60%, Aortic sclerosis without AS, mild RVE, mild reduced RVSF     Endometrial polyp    HTN (hypertension)    Hyperlipidemia    MI, old    Sleep related hypoxia     Has the patient had major surgery during 100 days prior to admission? Yes  Family History   family history includes Cancer - Other in her father; Colon cancer in her father; Coronary artery disease in her mother.  Current Medications  Current Facility-Administered Medications:    acetaminophen (TYLENOL) tablet 325-650 mg, 325-650 mg, Oral, Q6H PRN, Jenne Pane, PA-C  ALPRAZolam (XANAX) tablet 2 mg, 2 mg, Oral, QHS PRN, Gawne, Meghan M, PA-C   alum & mag hydroxide-simeth (MAALOX/MYLANTA) 200-200-20 MG/5ML suspension 30 mL, 30 mL, Oral, Q4H PRN, Gawne, Meghan M, PA-C, 30 mL at 09/22/23 2058   bisacodyl (DULCOLAX) suppository 10 mg, 10 mg, Rectal, Daily PRN, Gawne, Meghan M, PA-C   diltiazem (CARDIZEM CD) 24 hr capsule 240 mg, 240 mg, Oral, Daily, Gawne, Meghan M, PA-C, 240 mg at 09/24/23 0809   docusate sodium (COLACE) capsule 100 mg, 100 mg, Oral, BID, Gawne, Meghan M, PA-C, 100 mg at 09/24/23 0809   feeding supplement (KATE FARMS STANDARD 1.4) liquid 325 mL, 325 mL, Oral, BID BM, Alwyn Ren, MD   HYDROmorphone (DILAUDID) injection 0.5 mg, 0.5 mg, Intravenous, Q6H PRN, Alwyn Ren, MD   insulin aspart (novoLOG) injection 0-5 Units, 0-5 Units, Subcutaneous, QHS, Hall, Carole N, DO, 2 Units at 09/22/23 2208    insulin aspart (novoLOG) injection 0-9 Units, 0-9 Units, Subcutaneous, TID WC, Hall, Carole N, DO, 1 Units at 09/24/23 1204   menthol-cetylpyridinium (CEPACOL) lozenge 3 mg, 1 lozenge, Oral, PRN **OR** phenol (CHLORASEPTIC) mouth spray 1 spray, 1 spray, Mouth/Throat, PRN, Gawne, Meghan M, PA-C   methocarbamol (ROBAXIN) tablet 500 mg, 500 mg, Oral, Q6H PRN, 500 mg at 09/23/23 0958 **OR** methocarbamol (ROBAXIN) 500 mg in dextrose 5 % 50 mL IVPB, 500 mg, Intravenous, Q6H PRN, Gawne, Meghan M, PA-C   metoCLOPramide (REGLAN) tablet 5-10 mg, 5-10 mg, Oral, Q8H PRN, 10 mg at 09/24/23 0849 **OR** metoCLOPramide (REGLAN) injection 5-10 mg, 5-10 mg, Intravenous, Q8H PRN, Gawne, Meghan M, PA-C   metoprolol tartrate (LOPRESSOR) tablet 100 mg, 100 mg, Oral, BID, Gawne, Meghan M, PA-C, 100 mg at 09/24/23 0809   multivitamin with minerals tablet 1 tablet, 1 tablet, Oral, Daily, Alwyn Ren, MD, 1 tablet at 09/24/23 8657   oxyCODONE (Oxy IR/ROXICODONE) immediate release tablet 10 mg, 10 mg, Oral, Q4H PRN, Gawne, Meghan M, PA-C, 10 mg at 09/24/23 8469   oxyCODONE (Oxy IR/ROXICODONE) immediate release tablet 5 mg, 5 mg, Oral, Q4H PRN, Gawne, Meghan M, PA-C, 5 mg at 09/23/23 2234   pantoprazole (PROTONIX) EC tablet 80 mg, 80 mg, Oral, Q1200, Levester Fresh M, PA-C, 80 mg at 09/23/23 1611   polyethylene glycol (MIRALAX / GLYCOLAX) packet 17 g, 17 g, Oral, Daily PRN, Gawne, Meghan M, PA-C   rosuvastatin (CRESTOR) tablet 20 mg, 20 mg, Oral, Daily, Gawne, Meghan M, PA-C, 20 mg at 09/24/23 0809   scopolamine (TRANSDERM-SCOP) 1 MG/3DAYS 1.5 mg, 1 patch, Transdermal, Q72H, Alwyn Ren, MD, 1.5 mg at 09/24/23 0936   venlafaxine XR (EFFEXOR-XR) 24 hr capsule 75 mg, 75 mg, Oral, Q breakfast, Gawne, Meghan M, PA-C, 75 mg at 09/24/23 0809  Patients Current Diet:  Diet Order             Diet heart healthy/carb modified Room service appropriate? Yes; Fluid consistency: Thin  Diet effective now                    Precautions / Restrictions Precautions Precautions: Fall Precaution Comments: bladder incontinence, watch SpO2 Restrictions Weight Bearing Restrictions: Yes RLE Weight Bearing: Weight bearing as tolerated Other Position/Activity Restrictions: WBAT   Has the patient had 2 or more falls or a fall with injury in the past year? Yes  Prior Activity Level Community (5-7x/wk): Went out daily, was driving.  Prior Functional Level Self Care: Did the patient need help bathing, dressing, using the toilet or  eating? Needed some help  Indoor Mobility: Did the patient need assistance with walking from room to room (with or without device)? Independent  Stairs: Did the patient need assistance with internal or external stairs (with or without device)? Needed some help  Functional Cognition: Did the patient need help planning regular tasks such as shopping or remembering to take medications? Independent  Patient Information Are you of Hispanic, Latino/a,or Spanish origin?: A. No, not of Hispanic, Latino/a, or Spanish origin What is your race?: A. White Do you need or want an interpreter to communicate with a doctor or health care staff?: 0. No  Patient's Response To:  Health Literacy and Transportation Is the patient able to respond to health literacy and transportation needs?: Yes Health Literacy - How often do you need to have someone help you when you read instructions, pamphlets, or other written material from your doctor or pharmacy?: Never In the past 12 months, has lack of transportation kept you from medical appointments or from getting medications?: No In the past 12 months, has lack of transportation kept you from meetings, work, or from getting things needed for daily living?: No  Home Assistive Devices / Equipment Home Equipment: Agricultural consultant (2 wheels), Hand held shower head, Cane - single point, Crutches, Tub bench, BSC/3in1, Other (comment) (leg lifter, home  oxygen)  Prior Device Use: Indicate devices/aids used by the patient prior to current illness, exacerbation or injury?  Single point cane  Current Functional Level Cognition  Overall Cognitive Status: Impaired/Different from baseline Current Attention Level: Sustained Orientation Level: Oriented X4 Following Commands: Follows one step commands consistently General Comments: pt reported feeling a little off or fuzzy during tx session and apparent confusion noted    Extremity Assessment (includes Sensation/Coordination)  Upper Extremity Assessment: Generalized weakness  Lower Extremity Assessment: Generalized weakness, LLE deficits/detail LLE Deficits / Details: history of Left femur surgery. pt reports limited hip flexion at baseline.    ADLs  Overall ADL's : Needs assistance/impaired Grooming: Set up, Sitting Upper Body Bathing: Set up, Sitting Lower Body Bathing: Maximal assistance, Sit to/from stand Upper Body Dressing : Set up, Sitting Lower Body Dressing: Total assistance, Sit to/from stand Toilet Transfer: Minimal assistance, Rolling walker (2 wheels), BSC/3in1 Toilet Transfer Details (indicate cue type and reason): 2 person available for safety although not needed for physical assist. Toileting- Clothing Manipulation and Hygiene: Total assistance, Sit to/from stand    Mobility  Overal bed mobility: Needs Assistance Bed Mobility: Supine to Sit Supine to sit: Min assist, HOB elevated, Used rails (leg lifter) General bed mobility comments: pt has personal leg lifter and required instruction on use of proper placement, pt able to manuver R LE to EOB, increased time and cues for scooting hips to EOB    Transfers  Overall transfer level: Needs assistance Equipment used: Rolling walker (2 wheels) Transfers: Sit to/from Stand, Bed to chair/wheelchair/BSC Sit to Stand: Mod assist, +2 physical assistance, Min assist Bed to/from chair/wheelchair/BSC transfer type:: Step pivot Step  pivot transfers: Contact guard assist, +2 physical assistance Transfer via Lift Equipment: Stedy General transfer comment: Sit to stand transition from bed utilizing RW required Mod Ax2. Sit to stand from [facility] with RW only required Min AX1 d/t higher surface. VC provided for hand placement with RW management prior to sit<>stand transitions. VC to kick out RLE while sitting to decrease pain.    Ambulation / Gait / Stairs / Wheelchair Mobility  Ambulation/Gait Ambulation/Gait assistance: Min assist, +2 physical assistance Gait Distance (Feet): 3  Feet Assistive device: Rolling walker (2 wheels) Gait Pattern/deviations: Step-to pattern, Decreased step length - right, Decreased step length - left, Antalgic General Gait Details: small steps with RW to get to recliner, mod visual cues for sequencing and turning enough to be able to complete stand to sit transfer to recliner. PT provided pt with youth RW Gait velocity: decreased    Posture / Balance Balance Overall balance assessment: History of Falls, Needs assistance Sitting-balance support: Single extremity supported, Feet supported Sitting balance-Leahy Scale: Fair Standing balance support: Bilateral upper extremity supported, Reliant on assistive device for balance Standing balance-Leahy Scale: Poor    Special needs/care consideration Skin post op incision right hip with dressing and Special service needs none   Previous Home Environment (from acute therapy documentation) Living Arrangements: Spouse/significant other Available Help at Discharge: Personal care attendant, Other (Comment) (Aide assistance 5X/week 9A-5P. They assist with her husband (dementia) although they are able to assist her as well.) Type of Home: House Home Layout: Two level, Bed/bath upstairs, Other (Comment), Able to live on main level with bedroom/bathroom Alternate Level Stairs-Rails: Right Alternate Level Stairs-Number of Steps: 14 Home Access: Stairs to  enter Entrance Stairs-Rails: None Entrance Stairs-Number of Steps: 3 in front, 4 in garage, now has ramp in the front too but usually uses garage entrance. Ramp entrance has 1 step per daughter Bathroom Shower/Tub: Engineer, manufacturing systems: Standard Bathroom Accessibility: Yes Home Care Services: Yes Type of Home Care Services: Home RN, Comptroller Additional Comments: downstairs does not have walk in shower, has to scoot into tub using shower bench downstairs  Discharge Living Setting Plans for Discharge Living Setting: Patient's home, House, Lives with (comment) (Lives with spouse.) Type of Home at Discharge: House Discharge Home Layout: Two level, Able to live on main level with bedroom/bathroom Alternate Level Stairs-Number of Steps: Flight to 2nd level Discharge Home Access: Ramped entrance Discharge Bathroom Shower/Tub: Tub/shower unit, Curtain Discharge Bathroom Toilet: Standard Discharge Bathroom Accessibility: Yes How Accessible: Accessible via walker Does the patient have any problems obtaining your medications?: No  Social/Family/Support Systems Patient Roles: Spouse, Parent, Other (Comment) (Has daughter and husband with dementia) Contact Information: Claudean Severance - daughter - [phone] Anticipated Caregiver: Caregivers daily and can hire help as needed Ability/Limitations of Caregiver: Husband has caregivers during the day for early dementia and can hire caregivers as needed. Caregiver Availability: 24/7 Discharge Plan Discussed with Primary Caregiver: Yes Is Caregiver In Agreement with Plan?: Yes Does Caregiver/Family have Issues with Lodging/Transportation while Pt is in Rehab?: No  Goals Patient/Family Goal for Rehab: PT/OT mod I and supervision goals Expected length of stay: 10-12 days Pt/Family Agrees to Admission and willing to participate: Yes Program Orientation Provided & Reviewed with Pt/Caregiver Including Roles  & Responsibilities: Yes  Decrease  burden of Care through IP rehab admission: N/A  Possible need for SNF placement upon discharge: not planned  Patient Condition: I have reviewed medical records from [facility], spoken with CM, and daughter. I discussed via phone for inpatient rehabilitation assessment.  Patient will benefit from ongoing PT and OT, can actively participate in 3 hours of therapy a day 5 days of the week, and can make measurable gains during the admission.  Patient will also benefit from the coordinated team approach during an Inpatient Acute Rehabilitation admission.  The patient will receive intensive therapy as well as Rehabilitation physician, nursing, social worker, and care management interventions.  Due to bladder management, bowel management, safety, skin/wound care, disease management, medication administration,  pain management, and patient education the patient requires 24 hour a day rehabilitation nursing.  The patient is currently min-mod A  with mobility and basic ADLs.  Discharge setting and therapy post discharge at home with home health is anticipated.  Patient has agreed to participate in the Acute Inpatient Rehabilitation Program and will admit today.  Preadmission Screen Completed By:  Trish Mage, 09/24/2023 12:42 PM ______________________________________________________________________   Discussed status with Dr. Berline Chough  on 09/28/23 at  930 and received approval for admission today.  Admission Coordinator:  Trish Mage, RN, time 3086 /Date 09/28/23   Assessment/Plan: Diagnosis: R hip fracture mechanical fall Does the need for close, 24 hr/day Medical supervision in concert with the patient's rehab needs make it unreasonable for this patient to be served in a less intensive setting? Yes Co-Morbidities requiring supervision/potential complications: HTN, , HLD, CAD s/p NSTEMI and stents; BMI 31; pos top pain Due to bladder management, bowel management, safety, skin/wound care,  disease management, medication administration, pain management, and patient education, does the patient require 24 hr/day rehab nursing? Yes Does the patient require coordinated care of a physician, rehab nurse, PT, OT, and SLP to address physical and functional deficits in the context of the above medical diagnosis(es)? Yes Addressing deficits in the following areas: balance, endurance, locomotion, strength, transferring, bowel/bladder control, bathing, dressing, feeding, grooming, and toileting Can the patient actively participate in an intensive therapy program of at least 3 hrs of therapy 5 days a week? Yes The potential for patient to make measurable gains while on inpatient rehab is good Anticipated functional outcomes upon discharge from inpatient rehab: modified independent and supervision PT, modified independent and supervision OT, n/a SLP Estimated rehab length of stay to reach the above functional goals is: 10-12 days Anticipated discharge destination: Home 10. Overall Rehab/Functional Prognosis: good   MD Signature:

## 2023-09-24 ENCOUNTER — Inpatient Hospital Stay (HOSPITAL_COMMUNITY): Payer: Medicare Other

## 2023-09-24 DIAGNOSIS — S72001A Fracture of unspecified part of neck of right femur, initial encounter for closed fracture: Secondary | ICD-10-CM | POA: Diagnosis not present

## 2023-09-24 LAB — CBC
HCT: 27.5 % — ABNORMAL LOW (ref 36.0–46.0)
Hemoglobin: 8.4 g/dL — ABNORMAL LOW (ref 12.0–15.0)
MCH: 28 pg (ref 26.0–34.0)
MCHC: 30.5 g/dL (ref 30.0–36.0)
MCV: 91.7 fL (ref 80.0–100.0)
Platelets: 135 10*3/uL — ABNORMAL LOW (ref 150–400)
RBC: 3 MIL/uL — ABNORMAL LOW (ref 3.87–5.11)
RDW: 15.3 % (ref 11.5–15.5)
WBC: 6.9 10*3/uL (ref 4.0–10.5)
nRBC: 0.6 % — ABNORMAL HIGH (ref 0.0–0.2)

## 2023-09-24 LAB — COMPREHENSIVE METABOLIC PANEL
ALT: 26 U/L (ref 0–44)
AST: 28 U/L (ref 15–41)
Albumin: 2.9 g/dL — ABNORMAL LOW (ref 3.5–5.0)
Alkaline Phosphatase: 69 U/L (ref 38–126)
Anion gap: 9 (ref 5–15)
BUN: 13 mg/dL (ref 8–23)
CO2: 30 mmol/L (ref 22–32)
Calcium: 8.2 mg/dL — ABNORMAL LOW (ref 8.9–10.3)
Chloride: 96 mmol/L — ABNORMAL LOW (ref 98–111)
Creatinine, Ser: 0.71 mg/dL (ref 0.44–1.00)
GFR, Estimated: >60 mL/min (ref >60)
Glucose, Bld: 108 mg/dL — ABNORMAL HIGH (ref 70–99)
Potassium: 3.6 mmol/L (ref 3.5–5.1)
Sodium: 135 mmol/L (ref 135–145)
Total Bilirubin: 0.5 mg/dL (ref 0.3–1.2)
Total Protein: 5.8 g/dL — ABNORMAL LOW (ref 6.5–8.1)

## 2023-09-24 LAB — GLUCOSE, CAPILLARY
Glucose-Capillary: 119 mg/dL — ABNORMAL HIGH (ref 70–99)
Glucose-Capillary: 122 mg/dL — ABNORMAL HIGH (ref 70–99)
Glucose-Capillary: 123 mg/dL — ABNORMAL HIGH (ref 70–99)
Glucose-Capillary: 168 mg/dL — ABNORMAL HIGH (ref 70–99)
Glucose-Capillary: 148 mg/dL — ABNORMAL HIGH (ref 70–99)

## 2023-09-24 MED ORDER — SCOPOLAMINE 1 MG/3DAYS TD PT72
1.0000 | MEDICATED_PATCH | TRANSDERMAL | Status: DC
  Administered 2023-09-24 – 2023-09-27 (×3): 1.5 mg via TRANSDERMAL
  Filled 2023-09-24 (×3): qty 1

## 2023-09-24 MED ORDER — KATE FARMS STANDARD 1.4 PO LIQD
325.0000 mL | Freq: Two times a day (BID) | ORAL | Status: DC
  Administered 2023-09-25 (×2): 325 mL via ORAL
  Filled 2023-09-24 (×10): qty 325

## 2023-09-24 MED ORDER — ADULT MULTIVITAMIN W/MINERALS CH
1.0000 | ORAL_TABLET | Freq: Every day | ORAL | Status: DC
  Administered 2023-09-24 – 2023-09-28 (×5): 1 via ORAL
  Filled 2023-09-24 (×5): qty 1

## 2023-09-24 NOTE — Progress Notes (Signed)
[facility] IP rehab admissions - I met with rehab team this am.  Dr. Riley Kill says we will try to obtain a rehab bed on CIR on Monday for patient if she continues to remain in house.  I will have my partner follow up on Monday.  Call for questions.  [phone]

## 2023-09-24 NOTE — Progress Notes (Signed)
PROGRESS NOTE    Carrie Chung  JYN:[id number] DOB: 1941/05/04 DOA: 09/20/2023 PCP: Margaree Mackintosh, MD    Brief Narrative:  Carrie Chung is a 82 y.o. female with medical history significant of CAD, NSTEMI s/p stents, HTN, HLD.  Pt in to ED with R hip pain after mechanical fall at home.  Pain severe, unable to bear wt.  Also has R shoulder pain.  Denies head trauma, denies LOC.  Fall was due to tripping up over stairs. status post right hip nail fixation on 09/21/2023 by Dr. Eulah Pont.  Assessment & Plan:   Principal Problem:   Closed right hip fracture, initial encounter [facility]) Active Problems:   HYPERTENSION, BENIGN   Coronary Artery Disease   Hypoxemic respiratory failure, chronic (HCC)   Hypokalemia  Closed right hip fracture, initial encounter [facility]), status post right hip nail fixation on 09/21/2023 by Dr. Eulah Pont.  Continue pain management with Oxy and decreased dose of Dilaudid.  Continue bowel regimen.  She reports having large BM overnight.  ABLA-Ortho notes reviewed patient lost a lot of blood during surgery will monitor hemoglobin closely. hemoglobin dropped to 7.3 from  8.3 from 11 She received 1 unit of packed RBC on 09/23/2023 with improvement in her hemoglobin to 8.4 from 7.3. Her Plavix is currently on hold  Hypokalemia Resolved  Hypoxemic respiratory failure, chronic (HCC) On 2L O2 at bedtime at baseline due to sleep related hypoxia (see pulm notes from Feb)Ordered O2 at bedtime.Maintain O2 saturation above 92%. Chest x-ray on admission with vague early process of the left base consistent with edema volume loss of scarring.  She was wheezy today did complain of some shortness of breath We will follow-up on the chest x-ray today   Coronary Artery Disease H/o NSTEMI and multiple stents back in 2013.Cont metoprolol, cardizem, crestor.Holding ASA / Plavix .  Restart when okay with Ortho.   HYPERTENSION-continue Cardizem and metoprolol.  Continue to hold  HCTZ.  Nutrition Problem: Increased nutrient needs Etiology: hip fracture  Signs/Symptoms: estimated needs  Interventions: Refer to RD note for recommendations, MVI  Estimated body mass index is 30.74 kg/m as calculated from the following:   Height as of this encounter: 5' (1.524 m).   Weight as of this encounter: 71.4 kg.  DVT prophylaxis: None  code Status: Full Family Communication: None at bedside  disposition Plan:  Status is: CIR Remains inpatient appropriate because: Hip fracture   Consultants: Ortho  Procedures: None Antimicrobials: None  Subjective: Daughter feels that her face was flushed early this morning I did not notice any flushing when I saw her later she complained of nausea scopolamine patch was ordered she also received Reglan as needed  Objective: Vitals:   09/23/23 1751 09/23/23 2106 09/24/23 0606 09/24/23 0815  BP: (!) 88/67 (!) 114/57 (!) 119/52   Pulse: 68 74 78   Resp: 18 20 18    Temp: 98.7 F (37.1 C) 99.8 F (37.7 C) 98.4 F (36.9 C) 99.8 F (37.7 C)  TempSrc: Oral   Oral  SpO2: 99% 100% 96%   Weight:      Height:        Intake/Output Summary (Last 24 hours) at 09/24/2023 0907 Last data filed at 09/24/2023 0606 Gross per 24 hour  Intake 1620.5 ml  Output 500 ml  Net 1120.5 ml   Filed Weights   09/20/23 1856  Weight: 71.4 kg    Examination:  General exam: Appears in no acute distress  respiratory system few scattered wheezes  to auscultation. Respiratory effort normal. Cardiovascular system: S1 & S2 heard, RRR. No JVD, murmurs, rubs, gallops or clicks. No pedal edema. Gastrointestinal system: Abdomen is nondistended, soft and nontender. No organomegaly or masses felt. Normal bowel sounds heard. Central nervous system: Alert and oriented. No focal neurological deficits. Extremities right incision clean dry intact covered with dressing   Data Reviewed: I have personally reviewed following labs and imaging studies  CBC: Recent  Labs  Lab 09/20/23 1912 09/21/23 0448 09/22/23 0433 09/23/23 0427  WBC 6.5 6.4 6.2 10.1  NEUTROABS 5.1  --   --   --   HGB 13.4 11.4* 8.3* 7.5*  HCT 42.1 35.9* 26.5* 23.3*  MCV 91.9 91.3 92.7 91.7  PLT 164 151 135* 177   Basic Metabolic Panel: Recent Labs  Lab 09/20/23 1912 09/21/23 0448 09/22/23 0433 09/23/23 0427  NA 138 135 131* 138  K 2.6* 3.4* 3.8 3.9  CL 93* 92* 90* 93*  CO2 33* 33* 30 33*  GLUCOSE 186* 149* 301* 190*  BUN 9 11 14 15   CREATININE 0.89 0.79 0.88 0.73  CALCIUM 8.9 8.3* 7.9* 8.6*   GFR: Estimated Creatinine Clearance: 47 mL/min (by C-G formula based on SCr of 0.73 mg/dL). Liver Function Tests: Recent Labs  Lab 09/23/23 0427  AST 40  ALT 35  ALKPHOS 79  BILITOT 0.3  PROT 6.0*  ALBUMIN 2.9*   No results for input(s): "LIPASE", "AMYLASE" in the last 168 hours. No results for input(s): "AMMONIA" in the last 168 hours. Coagulation Profile: No results for input(s): "INR", "PROTIME" in the last 168 hours. Cardiac Enzymes: No results for input(s): "CKTOTAL", "CKMB", "CKMBINDEX", "TROPONINI" in the last 168 hours. BNP (last 3 results) No results for input(s): "PROBNP" in the last 8760 hours. HbA1C: No results for input(s): "HGBA1C" in the last 72 hours. CBG: Recent Labs  Lab 09/23/23 0730 09/23/23 1157 09/23/23 1635 09/23/23 2224 09/24/23 0712  GLUCAP 184* 172* 166* 118* 119*   Lipid Profile: No results for input(s): "CHOL", "HDL", "LDLCALC", "TRIG", "CHOLHDL", "LDLDIRECT" in the last 72 hours. Thyroid Function Tests: No results for input(s): "TSH", "T4TOTAL", "FREET4", "T3FREE", "THYROIDAB" in the last 72 hours. Anemia Panel: No results for input(s): "VITAMINB12", "FOLATE", "FERRITIN", "TIBC", "IRON", "RETICCTPCT" in the last 72 hours. Sepsis Labs: No results for input(s): "PROCALCITON", "LATICACIDVEN" in the last 168 hours.  No results found for this or any previous visit (from the past 240 hour(s)).       Radiology Studies: No  results found.      Scheduled Meds:  diltiazem  240 mg Oral Daily   docusate sodium  100 mg Oral BID   feeding supplement (KATE FARMS STANDARD 1.4)  325 mL Oral BID BM   insulin aspart  0-5 Units Subcutaneous QHS   insulin aspart  0-9 Units Subcutaneous TID WC   metoprolol tartrate  100 mg Oral BID   multivitamin with minerals  1 tablet Oral Daily   pantoprazole  80 mg Oral Q1200   rosuvastatin  20 mg Oral Daily   scopolamine  1 patch Transdermal Q72H   venlafaxine XR  75 mg Oral Q breakfast   Continuous Infusions:  methocarbamol (ROBAXIN) IV       LOS: 4 days    Time spent: 38 min  Alwyn Ren, MD 09/24/2023, 9:07 AM

## 2023-09-24 NOTE — TOC Progression Note (Signed)
Transition of Care [facility]) - Progression Note   Patient Details  Name: Carrie Chung MRN: [id number] Date of Birth: 09/05/1941  Transition of Care [facility]) CM/SW Contact  Ewing Schlein, LCSW Phone Number: 09/24/2023, 3:24 PM  Clinical Narrative: CSW spoke with patient's daughter, Fernande Boyden, regarding IP rehab. Daughter requesting patient be referred to [place], [place], and [facility]. CSW sent referral in hub to [facility] and confirmed receipt with Victorino Dike. [facility] can accept the patient at any time.  CSW made referral to [facility] IP rehab, which was faxed to [phone]. CSW spoke with Velna Hatchet from [facility], who reported she will follow up with daughter.  CSW spoke with Larita Fife and made referral to [facility] IP rehab, which was faxed to [phone] for review. CSW received call from Fleet Contras, who reported she was calling for both [facility] and [facility] and both can possibly accept the patient on Monday pending bed availability as neither facility accepts weekend admissions. The facilities would also require updated PT/OT notes prior to admission.  CSW called daughter to follow up with patient and family choice for IP rehab. Daughter reported she does not want to commit to [facility] at this time until it is determined the patient is medically ready and there are no other beds available at the other IP rehabs. Daughter aware [facility] do not accept weekend admissions, but [facility] does. CSW updated Victorino Dike at [place]. Weekend TOC can reach admissions at [facility] at [phone] if patient is medically ready.  Expected Discharge Plan: IP Rehab Facility Barriers to Discharge: Continued Medical Work up  Expected Discharge Plan and Services In-house Referral: Clinical Social Work Living arrangements for the past 2 months: Single Family Home           DME Arranged: N/A DME Agency: NA  Social Determinants of Health (SDOH) Interventions SDOH  Screenings   Food Insecurity: No Food Insecurity (09/20/2023)  Housing: Low Risk  (09/20/2023)  Transportation Needs: No Transportation Needs (09/20/2023)  Utilities: Not At Risk (09/20/2023)  Alcohol Screen: Low Risk  (09/03/2021)  Depression (PHQ2-9): Low Risk  (03/05/2023)  Financial Resource Strain: Low Risk  (09/03/2021)  Physical Activity: Inactive (09/03/2021)  Social Connections: Socially Integrated (09/03/2021)  Stress: No Stress Concern Present (09/03/2021)  Tobacco Use: Medium Risk (09/20/2023)  Health Literacy: Adequate Health Literacy (09/06/2023)   Readmission Risk Interventions     No data to display

## 2023-09-24 NOTE — Progress Notes (Signed)
Physical Therapy Treatment Patient Details Name: Carrie Chung MRN: [id number] DOB: 12/19/1941 Today's Date: 09/24/2023   History of Present Illness 82yo F who presented to the ED on 09/21/23 after mechanical fall up stairs at home. Found to have R hip intertrochanteric fracture, received R IM nail 09/21/23. PMH hx Atrial tachycardia, CAD, HTN, HLD, MI, carotid stent, L IM nail placement    PT Comments   Pt admitted with above diagnosis.  Pt currently with functional limitations due to the deficits listed below (see PT Problem List). Pt in bed when PT arrived. Pt indicated 2/10 R LE pain at rest and pain increased to 6/10 with mobility. Pt required increased time and multimodal cues. Pt required min A for supine to sit with use of leg lifter, mod A x 2 for sit to stand  from EOB, min A for sit to stand  from elevated BSC, pt able to amb short distance BSC to recliner ~ 3 feet with RW and min A with cues, improved posture and abiltiy to off load R LE in stance phase with use of youth RW. Pt required specific cues for turn to recliner and use of UE for improved eccentric control. Pt left in recliner, all needs in place, supplemental O2 donned 2.5 L/min and CP to R LE. Pt will benefit from acute skilled PT to increase their independence and safety with mobility to allow discharge.   O2 at rest semi reclined in bed 96% on 2.5 L/min Seated EOB decreased to 83% and 56s to recover to 90% with cues for pursed lip breathing O2 s/p gait and once seated in recliner 89% on RA and quickly recovered to 90% , 10s  Pt c/o dizziness s/p gait and BP once seated in recliner 133/65 ( 67 PR)    If plan is discharge home, recommend the following: A lot of help with bathing/dressing/bathroom;A lot of help with walking and/or transfers;Two people to help with bathing/dressing/bathroom;Two people to help with walking and/or transfers;Help with stairs or ramp for entrance;Assist for transportation;Assistance with  cooking/housework   Can travel by private vehicle        Equipment Recommendations  Other (comment) (defer to next venue)    Recommendations for Other Services       Precautions / Restrictions Precautions Precautions: Fall Precaution Comments: bladder incontinence, watch SpO2 Restrictions Weight Bearing Restrictions: Yes RLE Weight Bearing: Weight bearing as tolerated Other Position/Activity Restrictions: WBAT     Mobility  Bed Mobility Overal bed mobility: Needs Assistance Bed Mobility: Supine to Sit     Supine to sit: Min assist, HOB elevated, Used rails (leg lifter)     General bed mobility comments: pt has personal leg lifter and required instruction on use of proper placement, pt able to manuver R LE to EOB, increased time and cues for scooting hips to EOB    Transfers Overall transfer level: Needs assistance Equipment used: Rolling walker (2 wheels) Transfers: Sit to/from Stand, Bed to chair/wheelchair/BSC Sit to Stand: Mod assist, +2 physical assistance, Min assist   Step pivot transfers: Contact guard assist, +2 physical assistance       General transfer comment: Sit to stand transition from bed utilizing RW required Mod Ax2. Sit to stand from [employer] with RW only required Min AX1 d/t higher surface. VC provided for hand placement with RW management prior to sit<>stand transitions. VC to kick out RLE while sitting to decrease pain.    Ambulation/Gait Ambulation/Gait assistance: Min assist, +2 physical assistance Gait Distance (  Feet): 3 Feet Assistive device: Rolling walker (2 wheels) Gait Pattern/deviations: Step-to pattern, Decreased step length - right, Decreased step length - left, Antalgic Gait velocity: decreased     General Gait Details: small steps with RW to get to recliner, mod visual cues for sequencing and turning enough to be able to complete stand to sit transfer to recliner. PT provided pt with youth RW   Stairs             Wheelchair  Mobility     Tilt Bed    Modified Rankin (Stroke Patients Only)       Balance Overall balance assessment: History of Falls, Needs assistance Sitting-balance support: Single extremity supported, Feet supported Sitting balance-Leahy Scale: Fair     Standing balance support: Bilateral upper extremity supported, Reliant on assistive device for balance Standing balance-Leahy Scale: Poor                              Cognition Arousal: Alert Behavior During Therapy: WFL for tasks assessed/performed Overall Cognitive Status: Impaired/Different from baseline Area of Impairment: Memory, Attention, Following commands                   Current Attention Level: Sustained Memory: Decreased short-term memory Following Commands: Follows one step commands consistently       General Comments: pt reported feeling a little off or fuzzy during tx session and apparent confusion noted        Exercises      General Comments General comments (skin integrity, edema, etc.): VSS on RA. PT removed O2 at start of their session with VSS. O2 replaced once seated in recliner.      Pertinent Vitals/Pain Pain Assessment Pain Assessment: 0-10 Pain Score: 6  Pain Location: right hip down leg after mobility and transfer. Pain Descriptors / Indicators: Discomfort, Constant, Operative site guarding Pain Intervention(s): Limited activity within patient's tolerance, Monitored during session, Repositioned, Ice applied    Home Living Family/patient expects to be discharged to:: Inpatient rehab Living Arrangements: Spouse/significant other Available Help at Discharge: Personal care attendant;Other (Comment) (Aide assistance 5X/week 9A-5P. They assist with her husband (dementia) although they are able to assist her as well.) Type of Home: House Home Access: Stairs to enter Entrance Stairs-Rails: None Entrance Stairs-Number of Steps: 3 in front, 4 in garage, now has ramp in the front  too but usually uses garage entrance. Ramp entrance has 1 step per daughter Alternate Level Stairs-Number of Steps: 14 Home Layout: Two level;Bed/bath upstairs;Other (Comment);Able to live on main level with bedroom/bathroom Home Equipment: Rolling Walker (2 wheels);Hand held shower head;Cane - single point;Crutches;Tub bench;BSC/3in1;Other (comment) (leg lifter, home oxygen) Additional Comments: downstairs does not have walk in shower, has to scoot into tub using shower bench downstairs    Prior Function            PT Goals (current goals can now be found in the care plan section) Acute Rehab PT Goals Patient Stated Goal: go to [facility] at [facility] PT Goal Formulation: With patient Time For Goal Achievement: 10/06/23 Potential to Achieve Goals: Good    Frequency    Min 1X/week      PT Plan      Co-evaluation PT/OT/SLP Co-Evaluation/Treatment: Yes Reason for Co-Treatment: To address functional/ADL transfers PT goals addressed during session: Mobility/safety with mobility;Balance;Proper use of DME OT goals addressed during session: ADL's and self-care;Strengthening/ROM;Proper use of Adaptive equipment and DME  AM-PAC PT "6 Clicks" Mobility   Outcome Measure  Help needed turning from your back to your side while in a flat bed without using bedrails?: A Little Help needed moving from lying on your back to sitting on the side of a flat bed without using bedrails?: A Little Help needed moving to and from a bed to a chair (including a wheelchair)?: A Lot Help needed standing up from a chair using your arms (e.g., wheelchair or bedside chair)?: A Lot Help needed to walk in hospital room?: A Little Help needed climbing 3-5 steps with a railing? : Total 6 Click Score: 14    End of Session Equipment Utilized During Treatment: Gait belt Activity Tolerance: Patient tolerated treatment well Patient left: in chair;with call bell/phone within reach;with chair alarm set Nurse  Communication: Mobility status PT Visit Diagnosis: Unsteadiness on feet (R26.81);Difficulty in walking, not elsewhere classified (R26.2);Muscle weakness (generalized) (M62.81);Repeated falls (R29.6);Pain Pain - Right/Left: Right Pain - part of body: Hip     Time: 1914-7829 PT Time Calculation (min) (ACUTE ONLY): 40 min  Charges:    $Gait Training: 8-22 mins $Therapeutic Activity: 8-22 mins PT General Charges $$ ACUTE PT VISIT: 1 Visit                     Johnny Bridge, PT Acute Rehab    Jacqualyn Posey 09/24/2023, 12:13 PM

## 2023-09-24 NOTE — Evaluation (Signed)
Occupational Therapy Evaluation Patient Details Name: Carrie Chung MRN: [id number] DOB: 03-09-1941 Today's Date: 09/24/2023   History of Present Illness 82yo F who presented to the ED on 09/21/23 after mechanical fall up stairs at home. Found to have R hip intertrochanteric fracture, received R IM nail 09/21/23. PMH hx Atrial tachycardia, CAD, HTN, HLD, MI, carotid stent, L IM nail placement   Clinical Impression   Pt admitted with the above diagnosis. Pt currently with functional limitations due to the deficits listed below (see OT Problem List). Prior to admit, pt was living at home with her husband who has a personal care attendant 5X/week. Pt reports that she needed limited assistance to complete ADL tasks while able to complete with set-up. Pt with limited hip flexion in left hip at baseline causing her to modify/compensate during LB dressing.  Pt will benefit from acute skilled OT to increase their safety and independence with ADL and functional mobility for ADL to facilitate discharge. Patient will benefit from intensive inpatient follow up therapy, >3 hours/day. OT will continue to follow patient acutely.          If plan is discharge home, recommend the following: A lot of help with walking and/or transfers;A lot of help with bathing/dressing/bathroom;Assistance with cooking/housework;Help with stairs or ramp for entrance;Assist for transportation    Functional Status Assessment  Patient has had a recent decline in their functional status and demonstrates the ability to make significant improvements in function in a reasonable and predictable amount of time.  Equipment Recommendations  None recommended by OT       Precautions / Restrictions Precautions Precautions: Fall Precaution Comments: bladder incontinence, watch SpO2 Restrictions Weight Bearing Restrictions: Yes RLE Weight Bearing: Weight bearing as tolerated      Mobility Bed Mobility Overal bed mobility:  (pt  sitting on EOB with PT upon therapy arrival)       Transfers Overall transfer level: Needs assistance Equipment used: Rolling walker (2 wheels) Transfers: Sit to/from Stand, Bed to chair/wheelchair/BSC Sit to Stand: Mod assist, +2 physical assistance, Min assist     Step pivot transfers: Contact guard assist, +2 physical assistance     General transfer comment: Sit to stand transition from bed utilizing RW required Mod Ax2. Sit to stand from [facility] with RW only required Min AX1 d/t higher surface. VC provided for hand placement with RW management prior to sit<>stand transitions. VC to kick out RLE while sitting to decrease pain.      Balance Overall balance assessment: History of Falls, Needs assistance Sitting-balance support: Single extremity supported, Feet supported Sitting balance-Leahy Scale: Fair     Standing balance support: Bilateral upper extremity supported, Reliant on assistive device for balance Standing balance-Leahy Scale: Poor            ADL either performed or assessed with clinical judgement   ADL Overall ADL's : Needs assistance/impaired     Grooming: Set up;Sitting   Upper Body Bathing: Set up;Sitting   Lower Body Bathing: Maximal assistance;Sit to/from stand   Upper Body Dressing : Set up;Sitting   Lower Body Dressing: Total assistance;Sit to/from stand   Toilet Transfer: Minimal assistance;Rolling walker (2 wheels);BSC/3in1 Toilet Transfer Details (indicate cue type and reason): 2 person available for safety although not needed for physical assist. Toileting- Clothing Manipulation and Hygiene: Total assistance;Sit to/from stand         Vision Baseline Vision/History: 1 Wears glasses Ability to See in Adequate Light: 1 Impaired Patient Visual Report: No change from  baseline Vision Assessment?: Wears glasses for reading     Perception Perception: Not tested       Praxis Praxis: Not tested       Pertinent Vitals/Pain Pain  Assessment Pain Assessment: 0-10 Pain Score: 6  Pain Location: right hip down leg after mobility and transfer. Pain Descriptors / Indicators: Discomfort Pain Intervention(s): Repositioned, Ice applied, Premedicated before session, Monitored during session     Extremity/Trunk Assessment Upper Extremity Assessment Upper Extremity Assessment: Generalized weakness   Lower Extremity Assessment Lower Extremity Assessment: Generalized weakness;LLE deficits/detail LLE Deficits / Details: history of Left femur surgery. pt reports limited hip flexion at baseline.   Cervical / Trunk Assessment Cervical / Trunk Assessment: Kyphotic   Communication Communication Communication: No apparent difficulties   Cognition Arousal: Alert Behavior During Therapy: WFL for tasks assessed/performed Overall Cognitive Status: Impaired/Different from baseline Area of Impairment: Memory, Attention, Following commands        Current Attention Level: Sustained Memory: Decreased short-term memory Following Commands: Follows one step commands consistently       General Comments: Difficulty providing precise prior functioning level information requiring more direct time line questions. Pt alternating between prior functioning levels to current and required clarification several times. OT asked questions regarding prior functioning level although pt continued to refer to her functioning level currently. Difficulty organizing thoughts while answering questions. Pt verbalized being off with her cognition. Could be medication related.     General Comments  VSS on RA. PT removed O2 at start of their session with VSS. O2 replaced once seated in recliner.            Home Living Family/patient expects to be discharged to:: Inpatient rehab Living Arrangements: Spouse/significant other Available Help at Discharge: Personal care attendant;Other (Comment) (Aide assistance 5X/week 9A-5P. They assist with her husband  (dementia) although they are able to assist her as well.) Type of Home: House Home Access: Stairs to enter Entrance Stairs-Number of Steps: 3 in front, 4 in garage, now has ramp in the front too but usually uses garage entrance. Ramp entrance has 1 step per daughter Entrance Stairs-Rails: None Home Layout: Two level;Bed/bath upstairs;Other (Comment);Able to live on main level with bedroom/bathroom Alternate Level Stairs-Number of Steps: 14 Alternate Level Stairs-Rails: Right Bathroom Shower/Tub: Chief Strategy Officer: Standard Bathroom Accessibility: Yes   Home Equipment: Agricultural consultant (2 wheels);Hand held shower head;Cane - single point;Crutches;Tub bench;BSC/3in1;Other (comment) (leg lifter, home oxygen)          Prior Functioning/Environment Prior Level of Function : Needs assist        ADLs Comments: Pt reports that she was able to complete her bathing and dressing with just set-up from aide. Supervision provided for shower transfers. Pt does not wear socks. Wear slip on shoes without heel back. Increased time needed to complete LB dressing while seated EOB.        OT Problem List: Decreased strength;Decreased cognition;Decreased activity tolerance;Impaired balance (sitting and/or standing);Decreased safety awareness;Decreased knowledge of use of DME or AE;Decreased knowledge of precautions      OT Treatment/Interventions: Self-care/ADL training;Therapeutic exercise;Therapeutic activities;Cognitive remediation/compensation;Energy conservation;DME and/or AE instruction;Patient/family education;Balance training;Manual therapy;Modalities    OT Goals(Current goals can be found in the care plan section) Acute Rehab OT Goals Patient Stated Goal: to get stronger OT Goal Formulation: Patient unable to participate in goal setting Time For Goal Achievement: 10/08/23 Potential to Achieve Goals: Good  OT Frequency: Min 1X/week    Co-evaluation PT/OT/SLP  Co-Evaluation/Treatment: Yes Reason for Co-Treatment:  To address functional/ADL transfers   OT goals addressed during session: ADL's and self-care;Strengthening/ROM;Proper use of Adaptive equipment and DME      AM-PAC OT "6 Clicks" Daily Activity     Outcome Measure Help from another person eating meals?: None Help from another person taking care of personal grooming?: A Little Help from another person toileting, which includes using toliet, bedpan, or urinal?: A Lot Help from another person bathing (including washing, rinsing, drying)?: A Lot Help from another person to put on and taking off regular upper body clothing?: A Little Help from another person to put on and taking off regular lower body clothing?: A Lot 6 Click Score: 16   End of Session Equipment Utilized During Treatment: Gait belt;Rolling walker (2 wheels);Oxygen Nurse Communication: Mobility status  Activity Tolerance: Patient tolerated treatment well Patient left: in chair;with call bell/phone within reach;with chair alarm set  OT Visit Diagnosis: Muscle weakness (generalized) (M62.81);Unsteadiness on feet (R26.81)                Time: 2956-2130 OT Time Calculation (min): 26 min Charges:  OT General Charges $OT Visit: 1 Visit OT Evaluation $OT Eval High Complexity: 1 High  Carrie Chung, OTR/L,CBIS  Supplemental OT - [facility] and [facility] Secure Chat Preferred    Jensyn Cambria, Charisse March 09/24/2023, 11:34 AM

## 2023-09-24 NOTE — Plan of Care (Signed)
  Problem: Education: Goal: Knowledge of General Education information will improve Description Including pain rating scale, medication(s)/side effects and non-pharmacologic comfort measures Outcome: Progressing   Problem: Nutrition: Goal: Adequate nutrition will be maintained Outcome: Progressing   Problem: Pain Managment: Goal: General experience of comfort will improve Outcome: Progressing

## 2023-09-25 DIAGNOSIS — S72001A Fracture of unspecified part of neck of right femur, initial encounter for closed fracture: Secondary | ICD-10-CM | POA: Diagnosis not present

## 2023-09-25 LAB — GLUCOSE, CAPILLARY
Glucose-Capillary: 124 mg/dL — ABNORMAL HIGH (ref 70–99)
Glucose-Capillary: 142 mg/dL — ABNORMAL HIGH (ref 70–99)
Glucose-Capillary: 136 mg/dL — ABNORMAL HIGH (ref 70–99)
Glucose-Capillary: 141 mg/dL — ABNORMAL HIGH (ref 70–99)

## 2023-09-25 LAB — BASIC METABOLIC PANEL
Anion gap: 10 (ref 5–15)
BUN: 16 mg/dL (ref 8–23)
CO2: 30 mmol/L (ref 22–32)
Calcium: 8.3 mg/dL — ABNORMAL LOW (ref 8.9–10.3)
Chloride: 93 mmol/L — ABNORMAL LOW (ref 98–111)
Creatinine, Ser: 0.79 mg/dL (ref 0.44–1.00)
GFR, Estimated: >60 mL/min (ref >60)
Glucose, Bld: 130 mg/dL — ABNORMAL HIGH (ref 70–99)
Potassium: 4.9 mmol/L (ref 3.5–5.1)
Sodium: 133 mmol/L — ABNORMAL LOW (ref 135–145)

## 2023-09-25 LAB — PREPARE RBC (CROSSMATCH)

## 2023-09-25 LAB — CBC
HCT: 25.3 % — ABNORMAL LOW (ref 36.0–46.0)
Hemoglobin: 7.7 g/dL — ABNORMAL LOW (ref 12.0–15.0)
MCH: 28.1 pg (ref 26.0–34.0)
MCHC: 30.4 g/dL (ref 30.0–36.0)
MCV: 92.3 fL (ref 80.0–100.0)
Platelets: 111 10*3/uL — ABNORMAL LOW (ref 150–400)
RBC: 2.74 MIL/uL — ABNORMAL LOW (ref 3.87–5.11)
RDW: 15.3 % (ref 11.5–15.5)
WBC: 6.2 10*3/uL (ref 4.0–10.5)
nRBC: 0.6 % — ABNORMAL HIGH (ref 0.0–0.2)

## 2023-09-25 MED ORDER — SODIUM CHLORIDE 0.9% IV SOLUTION: Freq: Once | INTRAVENOUS | Status: AC

## 2023-09-25 NOTE — Progress Notes (Signed)
PROGRESS NOTE    Carrie Chung  ZOX:[id number] DOB: Sep 30, 1941 DOA: 09/20/2023 PCP: Margaree Mackintosh, MD    Brief Narrative:  Carrie Chung is a 82 y.o. female with medical history significant of CAD, NSTEMI s/p stents, HTN, HLD.  Pt in to ED with R hip pain after mechanical fall at home.  Pain severe, unable to bear wt.  Also has R shoulder pain.  Denies head trauma, denies LOC.  Fall was due to tripping up over stairs. status post right hip nail fixation on 09/21/2023 by Dr. Eulah Pont.  Assessment & Plan:   Principal Problem:   Closed right hip fracture, initial encounter [facility]) Active Problems:   HYPERTENSION, BENIGN   Coronary Artery Disease   Hypoxemic respiratory failure, chronic (HCC)   Hypokalemia  Closed right hip fracture, initial encounter [facility]), status post right hip nail fixation on 09/21/2023 by Dr. Eulah Pont.  Continue pain management with Oxy and decreased dose of Dilaudid.  Continue bowel regimen.  She reports having large BM overnight.  ABLA-Ortho notes indicate patient lost a lot of blood during surgery.  She received 1 unit of packed RBC during this hospital stay with improvement in her hemoglobin to 8.3.  She has again dropped her hemoglobin to 7.7 even after transfusion and holding Plavix.  Will transfuse her another unit of packed RBC and continue to hold Plavix.  Hypokalemia Resolved  Hypoxemic respiratory failure, chronic (HCC) On 2L O2 at bedtime at baseline due to sleep related hypoxia (see pulm notes from Feb)Ordered O2 at bedtime.Maintain O2 saturation above 92%. Chest x-ray on admission with vague early process of the left base consistent with edema volume loss of scarring.   Repeat chest x-ray is clear.     Coronary Artery Disease H/o NSTEMI and multiple stents back in 2013.Cont metoprolol, cardizem, crestor.Holding ASA / Plavix .  Restart when okay with Ortho.   HYPERTENSION-continue Cardizem and metoprolol.  Continue to hold HCTZ.  Nutrition  Problem: Increased nutrient needs Etiology: hip fracture  Signs/Symptoms: estimated needs  Interventions: Refer to RD note for recommendations, MVI  Estimated body mass index is 30.74 kg/m as calculated from the following:   Height as of this encounter: 5' (1.524 m).   Weight as of this encounter: 71.4 kg.  DVT prophylaxis: None  code Status: Full Family Communication: None at bedside  disposition Plan:  Status is: CIR Remains inpatient appropriate because: Hip fracture   Consultants: Ortho  Procedures: None Antimicrobials: None  Subjective:  Does have exertional shortness of breath Hemoglobin trending down  Objective: Vitals:   09/24/23 0815 09/24/23 1200 09/24/23 2028 09/25/23 0652  BP:  108/83 121/80 (!) 109/55  Pulse:  67 89 73  Resp:  18 18 18   Temp: 99.8 F (37.7 C) 98.3 F (36.8 C) 98.7 F (37.1 C) 99.1 F (37.3 C)  TempSrc: Oral Oral Oral Oral  SpO2:  99% 99% 96%  Weight:      Height:        Intake/Output Summary (Last 24 hours) at 09/25/2023 1152 Last data filed at 09/25/2023 1000 Gross per 24 hour  Intake 330 ml  Output 200 ml  Net 130 ml   Filed Weights   09/20/23 1856  Weight: 71.4 kg    Examination:  General exam: Appears in no acute distress  respiratory system few scattered wheezes to auscultation. Respiratory effort normal. Cardiovascular system: S1 & S2 heard, RRR. No JVD, murmurs, rubs, gallops or clicks. No pedal edema. Gastrointestinal system: Abdomen is nondistended,  soft and nontender. No organomegaly or masses felt. Normal bowel sounds heard. Central nervous system: Alert and oriented. No focal neurological deficits. Extremities right incision clean dry intact covered with dressing   Data Reviewed: I have personally reviewed following labs and imaging studies  CBC: Recent Labs  Lab 09/20/23 1912 09/21/23 0448 09/22/23 0433 09/23/23 0427 09/24/23 0903 09/25/23 0459  WBC 6.5 6.4 6.2 10.1 6.9 6.2  NEUTROABS 5.1  --   --    --   --   --   HGB 13.4 11.4* 8.3* 7.5* 8.4* 7.7*  HCT 42.1 35.9* 26.5* 23.3* 27.5* 25.3*  MCV 91.9 91.3 92.7 91.7 91.7 92.3  PLT 164 151 135* 177 135* 111*   Basic Metabolic Panel: Recent Labs  Lab 09/21/23 0448 09/22/23 0433 09/23/23 0427 09/24/23 0903 09/25/23 0459  NA 135 131* 138 135 133*  K 3.4* 3.8 3.9 3.6 4.9  CL 92* 90* 93* 96* 93*  CO2 33* 30 33* 30 30  GLUCOSE 149* 301* 190* 108* 130*  BUN 11 14 15 13 16   CREATININE 0.79 0.88 0.73 0.71 0.79  CALCIUM 8.3* 7.9* 8.6* 8.2* 8.3*   GFR: Estimated Creatinine Clearance: 47 mL/min (by C-G formula based on SCr of 0.79 mg/dL). Liver Function Tests: Recent Labs  Lab 09/23/23 0427 09/24/23 0903  AST 40 28  ALT 35 26  ALKPHOS 79 69  BILITOT 0.3 0.5  PROT 6.0* 5.8*  ALBUMIN 2.9* 2.9*   No results for input(s): "LIPASE", "AMYLASE" in the last 168 hours. No results for input(s): "AMMONIA" in the last 168 hours. Coagulation Profile: No results for input(s): "INR", "PROTIME" in the last 168 hours. Cardiac Enzymes: No results for input(s): "CKTOTAL", "CKMB", "CKMBINDEX", "TROPONINI" in the last 168 hours. BNP (last 3 results) No results for input(s): "PROBNP" in the last 8760 hours. HbA1C: No results for input(s): "HGBA1C" in the last 72 hours. CBG: Recent Labs  Lab 09/24/23 1158 09/24/23 1623 09/24/23 2028 09/24/23 2151 09/25/23 0730  GLUCAP 122* 123* 168* 148* 124*   Lipid Profile: No results for input(s): "CHOL", "HDL", "LDLCALC", "TRIG", "CHOLHDL", "LDLDIRECT" in the last 72 hours. Thyroid Function Tests: No results for input(s): "TSH", "T4TOTAL", "FREET4", "T3FREE", "THYROIDAB" in the last 72 hours. Anemia Panel: No results for input(s): "VITAMINB12", "FOLATE", "FERRITIN", "TIBC", "IRON", "RETICCTPCT" in the last 72 hours. Sepsis Labs: No results for input(s): "PROCALCITON", "LATICACIDVEN" in the last 168 hours.  No results found for this or any previous visit (from the past 240 hour(s)).        Radiology Studies: DG Chest 1 View  Result Date: 09/24/2023 CLINICAL DATA:  Hypoxia. EXAM: CHEST  1 VIEW COMPARISON:  September 20, 2023. FINDINGS: The heart size and mediastinal contours are within normal limits. Both lungs are clear. The visualized skeletal structures are unremarkable. IMPRESSION: No active disease. Electronically Signed   By: Lupita Raider M.D.   On: 09/24/2023 13:35        Scheduled Meds:  sodium chloride   Intravenous Once   diltiazem  240 mg Oral Daily   docusate sodium  100 mg Oral BID   feeding supplement (KATE FARMS STANDARD 1.4)  325 mL Oral BID BM   insulin aspart  0-5 Units Subcutaneous QHS   insulin aspart  0-9 Units Subcutaneous TID WC   metoprolol tartrate  100 mg Oral BID   multivitamin with minerals  1 tablet Oral Daily   pantoprazole  80 mg Oral Q1200   rosuvastatin  20 mg Oral Daily  scopolamine  1 patch Transdermal Q72H   venlafaxine XR  75 mg Oral Q breakfast   Continuous Infusions:  methocarbamol (ROBAXIN) IV       LOS: 5 days    Time spent: 38 min  Alwyn Ren, MD 09/25/2023, 11:52 AM

## 2023-09-25 NOTE — TOC Progression Note (Signed)
Transition of Care [facility]) - Progression Note    Patient Details  Name: Carrie Chung MRN: [id number] Date of Birth: [birth year]  Transition of Care [facility]) CM/SW Contact  Adrian Prows, RN Phone Number: 09/25/2023, 2:40 PM  Clinical Narrative:    Pt not medically ready for d/c; TOC is following.   Expected Discharge Plan: IP Rehab Facility Barriers to Discharge: Continued Medical Work up  Expected Discharge Plan and Services In-house Referral: Clinical Social Work     Living arrangements for the past 2 months: Single Family Home                 DME Arranged: N/A DME Agency: NA                   Social Determinants of Health (SDOH) Interventions SDOH Screenings   Food Insecurity: No Food Insecurity (09/20/2023)  Housing: Low Risk  (09/20/2023)  Transportation Needs: No Transportation Needs (09/20/2023)  Utilities: Not At Risk (09/20/2023)  Alcohol Screen: Low Risk  (09/03/2021)  Depression (PHQ2-9): Low Risk  (03/05/2023)  Financial Resource Strain: Low Risk  (09/03/2021)  Physical Activity: Inactive (09/03/2021)  Social Connections: Socially Integrated (09/03/2021)  Stress: No Stress Concern Present (09/03/2021)  Tobacco Use: Medium Risk (09/20/2023)  Health Literacy: Adequate Health Literacy (09/06/2023)    Readmission Risk Interventions     No data to display

## 2023-09-26 DIAGNOSIS — S72001A Fracture of unspecified part of neck of right femur, initial encounter for closed fracture: Secondary | ICD-10-CM | POA: Diagnosis not present

## 2023-09-26 LAB — CBC
HCT: 29 % — ABNORMAL LOW (ref 36.0–46.0)
Hemoglobin: 9.1 g/dL — ABNORMAL LOW (ref 12.0–15.0)
MCH: 28.6 pg (ref 26.0–34.0)
MCHC: 31.4 g/dL (ref 30.0–36.0)
MCV: 91.2 fL (ref 80.0–100.0)
Platelets: 113 10*3/uL — ABNORMAL LOW (ref 150–400)
RBC: 3.18 MIL/uL — ABNORMAL LOW (ref 3.87–5.11)
RDW: 14.7 % (ref 11.5–15.5)
WBC: 8.6 10*3/uL (ref 4.0–10.5)
nRBC: 0.2 % (ref 0.0–0.2)

## 2023-09-26 LAB — GLUCOSE, CAPILLARY
Glucose-Capillary: 165 mg/dL — ABNORMAL HIGH (ref 70–99)
Glucose-Capillary: 131 mg/dL — ABNORMAL HIGH (ref 70–99)
Glucose-Capillary: 158 mg/dL — ABNORMAL HIGH (ref 70–99)
Glucose-Capillary: 122 mg/dL — ABNORMAL HIGH (ref 70–99)

## 2023-09-26 MED ORDER — CLOPIDOGREL BISULFATE 75 MG PO TABS
75.0000 mg | ORAL_TABLET | Freq: Every day | ORAL | Status: DC
  Administered 2023-09-27 – 2023-09-28 (×2): 75 mg via ORAL
  Filled 2023-09-26 (×2): qty 1

## 2023-09-26 NOTE — Progress Notes (Signed)
Physical Therapy Treatment Patient Details Name: Carrie Chung MRN: [id number] DOB: 1941-02-16 Today's Date: 09/26/2023   History of Present Illness 82 yo female who presented to the ED on 09/21/23 after mechanical fall up stairs at home. Found to have R hip intertrochanteric fracture, received R IM nail 09/21/23. PMH hx Atrial tachycardia, CAD, HTN, HLD, MI, carotid stent, L IM nail placement    PT Comments  Pt hesitant to ambulate however encouraged increasing activity as tolerated.  Pt progressed to ambulate 24 ft into hallway.  Pt did not think she would be able to perform but pleased with ability to walk today. Pt reports she anticipates d/c to [facility] tomorrow.    If plan is discharge home, recommend the following: Help with stairs or ramp for entrance;Assist for transportation;Assistance with cooking/housework;A little help with walking and/or transfers;A little help with bathing/dressing/bathroom   Can travel by private vehicle        Equipment Recommendations  None recommended by PT    Recommendations for Other Services       Precautions / Restrictions Precautions Precautions: Fall Precaution Comments: bladder incontinence Restrictions RLE Weight Bearing: Weight bearing as tolerated     Mobility  Bed Mobility               General bed mobility comments: pt in recliner    Transfers Overall transfer level: Needs assistance Equipment used: Rolling walker (2 wheels) Transfers: Sit to/from Stand Sit to Stand: Min assist           General transfer comment: verbal cues for hand placement, light assist to rise and steady    Ambulation/Gait Ambulation/Gait assistance: Contact guard assist Gait Distance (Feet): 24 Feet Assistive device: Rolling walker (2 wheels) Gait Pattern/deviations: Step-to pattern, Antalgic, Decreased stance time - right Gait velocity: decreased     General Gait Details: verbal cues for sequence, RW positioning, posture, distance to  tolerance; a couple short standing rest breaks due to fatigue   Stairs             Wheelchair Mobility     Tilt Bed    Modified Rankin (Stroke Patients Only)       Balance                                            Cognition Arousal: Alert Behavior During Therapy: WFL for tasks assessed/performed Overall Cognitive Status: Within Functional Limits for tasks assessed                                          Exercises      General Comments        Pertinent Vitals/Pain Pain Assessment Pain Assessment: 0-10 Pain Score: 5  Pain Location: right thigh/hip Pain Descriptors / Indicators: Tender, Sore, Discomfort Pain Intervention(s): Monitored during session, Repositioned    Home Living                          Prior Function            PT Goals (current goals can now be found in the care plan section) Progress towards PT goals: Progressing toward goals    Frequency    Min 1X/week      PT Plan  Co-evaluation              AM-PAC PT "6 Clicks" Mobility   Outcome Measure  Help needed turning from your back to your side while in a flat bed without using bedrails?: A Little Help needed moving from lying on your back to sitting on the side of a flat bed without using bedrails?: A Little Help needed moving to and from a bed to a chair (including a wheelchair)?: A Little Help needed standing up from a chair using your arms (e.g., wheelchair or bedside chair)?: A Little Help needed to walk in hospital room?: A Little Help needed climbing 3-5 steps with a railing? : A Lot 6 Click Score: 17    End of Session Equipment Utilized During Treatment: Gait belt Activity Tolerance: Patient tolerated treatment well Patient left: in chair;with call bell/phone within reach;with chair alarm set Nurse Communication: Mobility status PT Visit Diagnosis: Difficulty in walking, not elsewhere classified  (R26.2);Repeated falls (R29.6);Pain Pain - Right/Left: Right Pain - part of body: Hip     Time: 1610-9604 PT Time Calculation (min) (ACUTE ONLY): 14 min  Charges:    $Gait Training: 8-22 mins PT General Charges $$ ACUTE PT VISIT: 1 Visit                    Paulino Door, DPT Physical Therapist Acute Rehabilitation Services Office: [phone]    Carrie Chung 09/26/2023, 2:46 PM

## 2023-09-26 NOTE — Progress Notes (Signed)
PROGRESS NOTE    Carrie Chung  NFA:[id number] DOB: 06/02/1941 DOA: 09/20/2023 PCP: Margaree Mackintosh, MD    Brief Narrative:  Carrie Chung is a 82 y.o. female with medical history significant of CAD, NSTEMI s/p stents, HTN, HLD.  Pt in to ED with R hip pain after mechanical fall at home.  Pain severe, unable to bear wt.  Also has R shoulder pain.  Denies head trauma, denies LOC.  Fall was due to tripping up over stairs. status post right hip nail fixation on 09/21/2023 by Dr. Eulah Pont.  Assessment & Plan:   Principal Problem:   Closed right hip fracture, initial encounter [facility]) Active Problems:   HYPERTENSION, BENIGN   Coronary Artery Disease   Hypoxemic respiratory failure, chronic (HCC)   Hypokalemia  Closed right hip fracture, initial encounter [facility]), status post right hip nail fixation on 09/21/2023 by Dr. Eulah Pont.  Continue pain management with Oxy  Dc dilaudid  Continue bowel regimen.  Out of bed, ambulate, PT evaluation. Encouraged her to use incentive spirometer and flutter valve She had had low-grade temp of 100.5  overnight Will monitor closely for infection, the incision site itself looks the dressing looks clean dry and intact  ABLA-Ortho notes indicate patient lost a lot of blood during surgery.  She received 2 unit of packed RBC during this hospital stay with improvement in her hemoglobin to 9.1.   Will restart Plavix today  Hypokalemia Resolved  Hypoxemic respiratory failure, chronic (HCC) On 2L O2 at bedtime at baseline due to sleep related hypoxia (see pulm notes from Feb)Ordered O2 at bedtime.Maintain O2 saturation above 92%. Chest x-ray on admission with vague early process of the left base consistent with edema volume loss of scarring.   Repeat chest x-ray is clear.     Coronary Artery Disease H/o NSTEMI and multiple stents back in 2013.Cont metoprolol, cardizem, crestor.  HYPERTENSION-continue Cardizem and metoprolol.  Continue to hold  HCTZ.  Nutrition Problem: Increased nutrient needs Etiology: hip fracture  Signs/Symptoms: estimated needs  Interventions: Refer to RD note for recommendations, MVI  Estimated body mass index is 30.74 kg/m as calculated from the following:   Height as of this encounter: 5' (1.524 m).   Weight as of this encounter: 71.4 kg.  DVT prophylaxis: scd code Status: Full Family Communication: None at bedside  disposition Plan:  Status is: CIR Remains inpatient appropriate because: Hip fracture   Consultants: Ortho  Procedures: None Antimicrobials: None  Subjective:  Complains of feeling nauseous Had low-grade temp overnight 100.4 Denies diarrhea Has occasional cough Encouraged to use incentive spirometer Objective: Vitals:   09/25/23 1437 09/25/23 2131 09/26/23 0549 09/26/23 1256  BP: 128/77 122/72 110/66 (!) 123/53  Pulse: 76 83 68 68  Resp: 16 17 18 18   Temp: 99.7 F (37.6 C) 99 F (37.2 C) 98.7 F (37.1 C) 98.9 F (37.2 C)  TempSrc: Oral Oral Oral Oral  SpO2:  96% 98% 91%  Weight:      Height:        Intake/Output Summary (Last 24 hours) at 09/26/2023 1318 Last data filed at 09/26/2023 1304 Gross per 24 hour  Intake 1390 ml  Output 200 ml  Net 1190 ml   Filed Weights   09/20/23 1856  Weight: 71.4 kg    Examination:  General exam: Appears in no acute distress  respiratory system few scattered wheezes to auscultation. Respiratory effort normal. Cardiovascular system: S1 & S2 heard, RRR. No JVD, murmurs, rubs, gallops or clicks. No  pedal edema. Gastrointestinal system: Abdomen is nondistended, soft and nontender. No organomegaly or masses felt. Normal bowel sounds heard. Central nervous system: Alert and oriented. No focal neurological deficits. Extremities right incision clean dry intact covered with dressing   Data Reviewed: I have personally reviewed following labs and imaging studies  CBC: Recent Labs  Lab 09/20/23 1912 09/21/23 0448 09/22/23 0433  09/23/23 0427 09/24/23 0903 09/25/23 0459 09/26/23 0728  WBC 6.5   < > 6.2 10.1 6.9 6.2 8.6  NEUTROABS 5.1  --   --   --   --   --   --   HGB 13.4   < > 8.3* 7.5* 8.4* 7.7* 9.1*  HCT 42.1   < > 26.5* 23.3* 27.5* 25.3* 29.0*  MCV 91.9   < > 92.7 91.7 91.7 92.3 91.2  PLT 164   < > 135* 177 135* 111* 113*   < > = values in this interval not displayed.   Basic Metabolic Panel: Recent Labs  Lab 09/21/23 0448 09/22/23 0433 09/23/23 0427 09/24/23 0903 09/25/23 0459  NA 135 131* 138 135 133*  K 3.4* 3.8 3.9 3.6 4.9  CL 92* 90* 93* 96* 93*  CO2 33* 30 33* 30 30  GLUCOSE 149* 301* 190* 108* 130*  BUN 11 14 15 13 16   CREATININE 0.79 0.88 0.73 0.71 0.79  CALCIUM 8.3* 7.9* 8.6* 8.2* 8.3*   GFR: Estimated Creatinine Clearance: 47 mL/min (by C-G formula based on SCr of 0.79 mg/dL). Liver Function Tests: Recent Labs  Lab 09/23/23 0427 09/24/23 0903  AST 40 28  ALT 35 26  ALKPHOS 79 69  BILITOT 0.3 0.5  PROT 6.0* 5.8*  ALBUMIN 2.9* 2.9*   No results for input(s): "LIPASE", "AMYLASE" in the last 168 hours. No results for input(s): "AMMONIA" in the last 168 hours. Coagulation Profile: No results for input(s): "INR", "PROTIME" in the last 168 hours. Cardiac Enzymes: No results for input(s): "CKTOTAL", "CKMB", "CKMBINDEX", "TROPONINI" in the last 168 hours. BNP (last 3 results) No results for input(s): "PROBNP" in the last 8760 hours. HbA1C: No results for input(s): "HGBA1C" in the last 72 hours. CBG: Recent Labs  Lab 09/25/23 1159 09/25/23 1656 09/25/23 2137 09/26/23 0735 09/26/23 1127  GLUCAP 142* 136* 141* 165* 131*   Lipid Profile: No results for input(s): "CHOL", "HDL", "LDLCALC", "TRIG", "CHOLHDL", "LDLDIRECT" in the last 72 hours. Thyroid Function Tests: No results for input(s): "TSH", "T4TOTAL", "FREET4", "T3FREE", "THYROIDAB" in the last 72 hours. Anemia Panel: No results for input(s): "VITAMINB12", "FOLATE", "FERRITIN", "TIBC", "IRON", "RETICCTPCT" in the  last 72 hours. Sepsis Labs: No results for input(s): "PROCALCITON", "LATICACIDVEN" in the last 168 hours.  No results found for this or any previous visit (from the past 240 hour(s)).       Radiology Studies: No results found.      Scheduled Meds:  diltiazem  240 mg Oral Daily   docusate sodium  100 mg Oral BID   feeding supplement (KATE FARMS STANDARD 1.4)  325 mL Oral BID BM   insulin aspart  0-5 Units Subcutaneous QHS   insulin aspart  0-9 Units Subcutaneous TID WC   metoprolol tartrate  100 mg Oral BID   multivitamin with minerals  1 tablet Oral Daily   pantoprazole  80 mg Oral Q1200   rosuvastatin  20 mg Oral Daily   scopolamine  1 patch Transdermal Q72H   venlafaxine XR  75 mg Oral Q breakfast   Continuous Infusions:  methocarbamol (ROBAXIN) IV  LOS: 6 days    Time spent: 38 min  Alwyn Ren, MD 09/26/2023, 1:18 PM

## 2023-09-26 NOTE — Plan of Care (Signed)
?  Problem: Clinical Measurements: ?Goal: Ability to maintain clinical measurements within normal limits will improve ?Outcome: Progressing ?Goal: Will remain free from infection ?Outcome: Progressing ?Goal: Diagnostic test results will improve ?Outcome: Progressing ?  ?

## 2023-09-27 DIAGNOSIS — S72001A Fracture of unspecified part of neck of right femur, initial encounter for closed fracture: Secondary | ICD-10-CM | POA: Diagnosis not present

## 2023-09-27 LAB — GLUCOSE, CAPILLARY
Glucose-Capillary: 118 mg/dL — ABNORMAL HIGH (ref 70–99)
Glucose-Capillary: 140 mg/dL — ABNORMAL HIGH (ref 70–99)
Glucose-Capillary: 110 mg/dL — ABNORMAL HIGH (ref 70–99)
Glucose-Capillary: 133 mg/dL — ABNORMAL HIGH (ref 70–99)

## 2023-09-27 LAB — COMPREHENSIVE METABOLIC PANEL
ALT: 25 U/L (ref 0–44)
AST: 28 U/L (ref 15–41)
Albumin: 2.6 g/dL — ABNORMAL LOW (ref 3.5–5.0)
Alkaline Phosphatase: 65 U/L (ref 38–126)
Anion gap: 12 (ref 5–15)
BUN: 10 mg/dL (ref 8–23)
CO2: 27 mmol/L (ref 22–32)
Calcium: 8.3 mg/dL — ABNORMAL LOW (ref 8.9–10.3)
Chloride: 97 mmol/L — ABNORMAL LOW (ref 98–111)
Creatinine, Ser: 0.64 mg/dL (ref 0.44–1.00)
GFR, Estimated: >60 mL/min (ref >60)
Glucose, Bld: 110 mg/dL — ABNORMAL HIGH (ref 70–99)
Potassium: 3.2 mmol/L — ABNORMAL LOW (ref 3.5–5.1)
Sodium: 136 mmol/L (ref 135–145)
Total Bilirubin: 0.9 mg/dL (ref 0.3–1.2)
Total Protein: 5.9 g/dL — ABNORMAL LOW (ref 6.5–8.1)

## 2023-09-27 LAB — BPAM RBC
Blood Product Expiration Date: 202410302359
Blood Product Expiration Date: 202411022359
ISSUE DATE / TIME: 202410031513
ISSUE DATE / TIME: 202410051418
Unit Type and Rh: 5100
Unit Type and Rh: 5100

## 2023-09-27 LAB — TYPE AND SCREEN
ABO/RH(D): O POS
Antibody Screen: NEGATIVE
Unit division: 0
Unit division: 0

## 2023-09-27 LAB — CBC
HCT: 30.9 % — ABNORMAL LOW (ref 36.0–46.0)
Hemoglobin: 9.6 g/dL — ABNORMAL LOW (ref 12.0–15.0)
MCH: 28.4 pg (ref 26.0–34.0)
MCHC: 31.1 g/dL (ref 30.0–36.0)
MCV: 91.4 fL (ref 80.0–100.0)
Platelets: 115 10*3/uL — ABNORMAL LOW (ref 150–400)
RBC: 3.38 MIL/uL — ABNORMAL LOW (ref 3.87–5.11)
RDW: 14.7 % (ref 11.5–15.5)
WBC: 7.8 10*3/uL (ref 4.0–10.5)
nRBC: 0 % (ref 0.0–0.2)

## 2023-09-27 LAB — MAGNESIUM: Magnesium: 2.3 mg/dL (ref 1.7–2.4)

## 2023-09-27 MED ORDER — POTASSIUM CHLORIDE CRYS ER 20 MEQ PO TBCR
40.0000 meq | EXTENDED_RELEASE_TABLET | Freq: Once | ORAL | Status: AC
  Administered 2023-09-27: 40 meq via ORAL
  Filled 2023-09-27: qty 2

## 2023-09-27 NOTE — TOC Progression Note (Signed)
Transition of Care [facility]) - Progression Note   Patient Details  Name: Carrie Chung MRN: [id number] Date of Birth: 12-Sep-1941  Transition of Care [facility]) CM/SW Contact  Ewing Schlein, LCSW Phone Number: 09/27/2023, 12:58 PM  Clinical Narrative: [facility] does not have a bed available today, but may have one tomorrow. CSW left VM for patient's daughter, Claudean Severance, regarding bed availability. CSW updated Velna Hatchet at [facility] IP rehab that [facility] is awaiting call back from daughter.  Expected Discharge Plan: IP Rehab Facility Barriers to Discharge: Continued Medical Work up  Expected Discharge Plan and Services In-house Referral: Clinical Social Work Living arrangements for the past 2 months: Single Family Home            DME Arranged: N/A DME Agency: NA  Social Determinants of Health (SDOH) Interventions SDOH Screenings   Food Insecurity: No Food Insecurity (09/20/2023)  Housing: Low Risk  (09/20/2023)  Transportation Needs: No Transportation Needs (09/20/2023)  Utilities: Not At Risk (09/20/2023)  Alcohol Screen: Low Risk  (09/03/2021)  Depression (PHQ2-9): Low Risk  (03/05/2023)  Financial Resource Strain: Low Risk  (09/03/2021)  Physical Activity: Inactive (09/03/2021)  Social Connections: Socially Integrated (09/03/2021)  Stress: No Stress Concern Present (09/03/2021)  Tobacco Use: Medium Risk (09/20/2023)  Health Literacy: Adequate Health Literacy (09/06/2023)   Readmission Risk Interventions     No data to display

## 2023-09-27 NOTE — Progress Notes (Signed)
Occupational Therapy Treatment Patient Details Name: Carrie Chung MRN: [id number] DOB: 11-23-1941 Today's Date: 09/27/2023   History of present illness 82 yo female who presented to the ED on 09/21/23 after mechanical fall up stairs at home. Found to have R hip intertrochanteric fracture, received R IM nail 09/21/23. PMH hx Atrial tachycardia, CAD, HTN, HLD, MI, carotid stent, L IM nail placement   OT comments  Patient was able to engage in functional mobility in room to increase independence in toileting tasks with RW. Patient noted to have dizziness and nausea during session limiting its progress. Patient's BP was 128/71 mmhg and hR was 81bpm with O2 od 90% on RA. Patient returned to recliner with all needs within reach. Patient will benefit from intensive inpatient follow up therapy, >3 hours/day       If plan is discharge home, recommend the following:  A lot of help with walking and/or transfers;A lot of help with bathing/dressing/bathroom;Assistance with cooking/housework;Help with stairs or ramp for entrance;Assist for transportation   Equipment Recommendations  None recommended by OT       Precautions / Restrictions Precautions Precautions: Fall Precaution Comments: bladder incontinence Restrictions Weight Bearing Restrictions: Yes RLE Weight Bearing: Weight bearing as tolerated       Mobility Bed Mobility               General bed mobility comments: patient was up in the reclined and returned to the same.       Balance Overall balance assessment: History of Falls, Needs assistance Sitting-balance support: Single extremity supported, Feet supported Sitting balance-Leahy Scale: Fair     Standing balance support: Bilateral upper extremity supported, Reliant on assistive device for balance Standing balance-Leahy Scale: Poor       ADL either performed or assessed with clinical judgement   ADL Overall ADL's : Needs assistance/impaired     Grooming:  Brushing hair;Supervision/safety;Sitting Grooming Details (indicate cue type and reason): with encouragement to get all sides.   Upper Body Bathing Details (indicate cue type and reason): patient declined reporting she completed this already this AM.            Cognition Arousal: Alert Behavior During Therapy: WFL for tasks assessed/performed Overall Cognitive Status: Within Functional Limits for tasks assessed       General Comments: patient was noted to have some confusion and dizziness during session                   Pertinent Vitals/ Pain       Pain Assessment Pain Assessment: 0-10 Pain Score: 5  Pain Location: right thigh/hip Pain Descriptors / Indicators: Tender, Sore, Discomfort Pain Intervention(s): Limited activity within patient's tolerance, Monitored during session         Frequency  Min 1X/week        Progress Toward Goals  OT Goals(current goals can now be found in the care plan section)  Progress towards OT goals: Progressing toward goals     Plan         AM-PAC OT "6 Clicks" Daily Activity     Outcome Measure   Help from another person eating meals?: None Help from another person taking care of personal grooming?: A Little Help from another person toileting, which includes using toliet, bedpan, or urinal?: A Lot Help from another person bathing (including washing, rinsing, drying)?: A Lot Help from another person to put on and taking off regular upper body clothing?: A Little Help from another person to put on  and taking off regular lower body clothing?: A Lot 6 Click Score: 16    End of Session Equipment Utilized During Treatment: Gait belt;Rolling walker (2 wheels)  OT Visit Diagnosis: Muscle weakness (generalized) (M62.81);Unsteadiness on feet (R26.81)   Activity Tolerance Patient tolerated treatment well   Patient Left in chair;with call bell/phone within reach;with chair alarm set   Nurse Communication Mobility status         Time: 1610-9604 OT Time Calculation (min): 22 min  Charges: OT General Charges $OT Visit: 1 Visit OT Treatments $Self Care/Home Management : 8-22 mins  Rosalio Loud, MS Acute Rehabilitation Department Office# [phone]   Selinda Flavin 09/27/2023, 1:46 PM

## 2023-09-27 NOTE — Progress Notes (Signed)
PROGRESS NOTE    Carrie Chung  RUE:[id number] DOB: [birth year] DOA: 09/20/2023 PCP: Margaree Mackintosh, MD    Brief Narrative:  Carrie Chung is a 81 y.o. female with medical history significant of CAD, NSTEMI s/p stents, HTN, HLD.  Pt in to ED with R hip pain after mechanical fall at home.  Pain severe, unable to bear wt.  Also has R shoulder pain.  Denies head trauma, denies LOC.  Fall was due to tripping up over stairs. status post right hip nail fixation on 09/21/2023 by Dr. Eulah Pont.  Assessment & Plan:   Principal Problem:   Closed right hip fracture, initial encounter [facility]) Active Problems:   HYPERTENSION, BENIGN   Coronary Artery Disease   Hypoxemic respiratory failure, chronic (HCC)   Hypokalemia  Closed right hip fracture, initial encounter [facility]), status post right hip nail fixation on 09/21/2023 by Dr. Eulah Pont.  Continue pain management with Oxy  Dc dilaudid  Continue bowel regimen.  Out of bed, ambulate, PT evaluation. Encouraged her to use incentive spirometer and flutter valve She had had low-grade temp of 100.5  overnight Will monitor closely for infection, the incision site itself looks the dressing looks clean dry and intact  ABLA-Ortho notes indicate patient lost a lot of blood during surgery.  She received 2 unit of packed RBC during this hospital stay with improvement in her hemoglobin to 9.1.   Will restart Plavix today  Hypokalemia Repleting  Hypoxemic respiratory failure, chronic (HCC) On 2L O2 at bedtime at baseline due to sleep related hypoxia (see pulm notes from Feb)Ordered O2 at bedtime.Maintain O2 saturation above 92%. Chest x-ray on admission with vague early process of the left base consistent with edema volume loss of scarring.   Repeat chest x-ray is clear.     Coronary Artery Disease H/o NSTEMI and multiple stents back in 2013.Cont metoprolol, cardizem, crestor.  HYPERTENSION-continue Cardizem and metoprolol.  Continue to hold  HCTZ.  Nutrition Problem: Increased nutrient needs Etiology: hip fracture  Signs/Symptoms: estimated needs  Interventions: Refer to RD note for recommendations, MVI  Estimated body mass index is 30.74 kg/m as calculated from the following:   Height as of this encounter: 5' (1.524 m).   Weight as of this encounter: 71.4 kg.  DVT prophylaxis: scd code Status: Full Family Communication: None at bedside  disposition Plan:  Status is: CIR Remains inpatient appropriate because: Hip fracture   Consultants: Ortho  Procedures: None Antimicrobials: None  Subjective:   Resting  temp 99.2 No issues overnight  Objective: Vitals:   09/26/23 2121 09/27/23 0652 09/27/23 0856 09/27/23 1354  BP: (!) 117/51 133/60  (!) 115/52  Pulse: (!) 51 (!) 41 75 70  Resp: 14 16  18   Temp: 98.7 F (37.1 C) 98.3 F (36.8 C)  99.2 F (37.3 C)  TempSrc: Oral Oral  Oral  SpO2: 98% 92%  98%  Weight:      Height:        Intake/Output Summary (Last 24 hours) at 09/27/2023 1423 Last data filed at 09/27/2023 1400 Gross per 24 hour  Intake 850 ml  Output 0 ml  Net 850 ml   Filed Weights   09/20/23 1856  Weight: 71.4 kg    Examination:  General exam: Appears in no acute distress  respiratory system few scattered wheezes to auscultation. Respiratory effort normal. Cardiovascular system: S1 & S2 heard, RRR. No JVD, murmurs, rubs, gallops or clicks. No pedal edema. Gastrointestinal system: Abdomen is nondistended, soft and nontender.  No organomegaly or masses felt. Normal bowel sounds heard. Central nervous system: Alert and oriented. No focal neurological deficits. Extremities right incision clean dry intact covered with dressing   Data Reviewed: I have personally reviewed following labs and imaging studies  CBC: Recent Labs  Lab 09/20/23 1912 09/21/23 0448 09/23/23 0427 09/24/23 0903 09/25/23 0459 09/26/23 0728 09/27/23 0413  WBC 6.5   < > 10.1 6.9 6.2 8.6 7.8  NEUTROABS 5.1  --    --   --   --   --   --   HGB 13.4   < > 7.5* 8.4* 7.7* 9.1* 9.6*  HCT 42.1   < > 23.3* 27.5* 25.3* 29.0* 30.9*  MCV 91.9   < > 91.7 91.7 92.3 91.2 91.4  PLT 164   < > 177 135* 111* 113* 115*   < > = values in this interval not displayed.   Basic Metabolic Panel: Recent Labs  Lab 09/22/23 0433 09/23/23 0427 09/24/23 0903 09/25/23 0459 09/27/23 0413  NA 131* 138 135 133* 136  K 3.8 3.9 3.6 4.9 3.2*  CL 90* 93* 96* 93* 97*  CO2 30 33* 30 30 27   GLUCOSE 301* 190* 108* 130* 110*  BUN 14 15 13 16 10   CREATININE 0.88 0.73 0.71 0.79 0.64  CALCIUM 7.9* 8.6* 8.2* 8.3* 8.3*   GFR: Estimated Creatinine Clearance: 47 mL/min (by C-G formula based on SCr of 0.64 mg/dL). Liver Function Tests: Recent Labs  Lab 09/23/23 0427 09/24/23 0903 09/27/23 0413  AST 40 28 28  ALT 35 26 25  ALKPHOS 79 69 65  BILITOT 0.3 0.5 0.9  PROT 6.0* 5.8* 5.9*  ALBUMIN 2.9* 2.9* 2.6*   No results for input(s): "LIPASE", "AMYLASE" in the last 168 hours. No results for input(s): "AMMONIA" in the last 168 hours. Coagulation Profile: No results for input(s): "INR", "PROTIME" in the last 168 hours. Cardiac Enzymes: No results for input(s): "CKTOTAL", "CKMB", "CKMBINDEX", "TROPONINI" in the last 168 hours. BNP (last 3 results) No results for input(s): "PROBNP" in the last 8760 hours. HbA1C: No results for input(s): "HGBA1C" in the last 72 hours. CBG: Recent Labs  Lab 09/26/23 1127 09/26/23 1617 09/26/23 2117 09/27/23 0745 09/27/23 1136  GLUCAP 131* 158* 122* 118* 140*   Lipid Profile: No results for input(s): "CHOL", "HDL", "LDLCALC", "TRIG", "CHOLHDL", "LDLDIRECT" in the last 72 hours. Thyroid Function Tests: No results for input(s): "TSH", "T4TOTAL", "FREET4", "T3FREE", "THYROIDAB" in the last 72 hours. Anemia Panel: No results for input(s): "VITAMINB12", "FOLATE", "FERRITIN", "TIBC", "IRON", "RETICCTPCT" in the last 72 hours. Sepsis Labs: No results for input(s): "PROCALCITON", "LATICACIDVEN"  in the last 168 hours.  No results found for this or any previous visit (from the past 240 hour(s)).       Radiology Studies: No results found.      Scheduled Meds:  clopidogrel  75 mg Oral Daily   diltiazem  240 mg Oral Daily   docusate sodium  100 mg Oral BID   feeding supplement (K[employer] STANDARD 1.4)  325 mL Oral BID BM   insulin aspart  0-5 Units Subcutaneous QHS   insulin aspart  0-9 Units Subcutaneous TID WC   metoprolol tartrate  100 mg Oral BID   multivitamin with minerals  1 tablet Oral Daily   pantoprazole  80 mg Oral Q1200   rosuvastatin  20 mg Oral Daily   scopolamine  1 patch Transdermal Q72H   venlafaxine XR  75 mg Oral Q breakfast   Continuous  Infusions:  methocarbamol (ROBAXIN) IV       LOS: 7 days    Time spent: 38 min  Alwyn Ren, MD 09/27/2023, 2:23 PM

## 2023-09-27 NOTE — Progress Notes (Signed)
Inpatient Rehab Admissions Coordinator:    I do not have a CIR bed for this Pt. Today, but should have one tomorrow. Daughter updated.   Megan Salon, MS, CCC-SLP Rehab Admissions Coordinator  [phone] (celll) [phone] (office)

## 2023-09-28 ENCOUNTER — Other Ambulatory Visit: Payer: Self-pay

## 2023-09-28 ENCOUNTER — Observation Stay (HOSPITAL_COMMUNITY): Payer: Medicare Other

## 2023-09-28 ENCOUNTER — Encounter (HOSPITAL_COMMUNITY): Payer: Self-pay | Admitting: Physical Medicine and Rehabilitation

## 2023-09-28 ENCOUNTER — Inpatient Hospital Stay (HOSPITAL_COMMUNITY)
Admission: AD | Admit: 2023-09-28 | Discharge: 2023-10-09 | DRG: 560 | Disposition: A | Discharge status: Home / Self Care | Payer: Medicare Other | Source: Other Acute Inpatient Hospital | Attending: Physical Medicine and Rehabilitation | Admitting: Physical Medicine and Rehabilitation

## 2023-09-28 DIAGNOSIS — Z9049 Acquired absence of other specified parts of digestive tract: Secondary | ICD-10-CM

## 2023-09-28 DIAGNOSIS — S72141D Displaced intertrochanteric fracture of right femur, subsequent encounter for closed fracture with routine healing: Principal | ICD-10-CM

## 2023-09-28 DIAGNOSIS — F32A Depression, unspecified: Secondary | ICD-10-CM | POA: Diagnosis present

## 2023-09-28 DIAGNOSIS — B9620 Unspecified Escherichia coli [E. coli] as the cause of diseases classified elsewhere: Secondary | ICD-10-CM | POA: Diagnosis present

## 2023-09-28 DIAGNOSIS — S72001D Fracture of unspecified part of neck of right femur, subsequent encounter for closed fracture with routine healing: Secondary | ICD-10-CM | POA: Diagnosis not present

## 2023-09-28 DIAGNOSIS — S72001S Fracture of unspecified part of neck of right femur, sequela: Secondary | ICD-10-CM | POA: Diagnosis not present

## 2023-09-28 DIAGNOSIS — D696 Thrombocytopenia, unspecified: Secondary | ICD-10-CM | POA: Diagnosis present

## 2023-09-28 DIAGNOSIS — I252 Old myocardial infarction: Secondary | ICD-10-CM

## 2023-09-28 DIAGNOSIS — E876 Hypokalemia: Secondary | ICD-10-CM | POA: Diagnosis present

## 2023-09-28 DIAGNOSIS — E785 Hyperlipidemia, unspecified: Secondary | ICD-10-CM | POA: Diagnosis present

## 2023-09-28 DIAGNOSIS — Z79899 Other long term (current) drug therapy: Secondary | ICD-10-CM | POA: Diagnosis not present

## 2023-09-28 DIAGNOSIS — G4700 Insomnia, unspecified: Secondary | ICD-10-CM | POA: Diagnosis present

## 2023-09-28 DIAGNOSIS — Z882 Allergy status to sulfonamides status: Secondary | ICD-10-CM | POA: Diagnosis not present

## 2023-09-28 DIAGNOSIS — W010XXD Fall on same level from slipping, tripping and stumbling without subsequent striking against object, subsequent encounter: Secondary | ICD-10-CM | POA: Diagnosis present

## 2023-09-28 DIAGNOSIS — Z7982 Long term (current) use of aspirin: Secondary | ICD-10-CM | POA: Diagnosis not present

## 2023-09-28 DIAGNOSIS — Z8249 Family history of ischemic heart disease and other diseases of the circulatory system: Secondary | ICD-10-CM | POA: Diagnosis not present

## 2023-09-28 DIAGNOSIS — M623 Immobility syndrome (paraplegic): Secondary | ICD-10-CM | POA: Diagnosis not present

## 2023-09-28 DIAGNOSIS — Z87891 Personal history of nicotine dependence: Secondary | ICD-10-CM | POA: Diagnosis not present

## 2023-09-28 DIAGNOSIS — E119 Type 2 diabetes mellitus without complications: Secondary | ICD-10-CM

## 2023-09-28 DIAGNOSIS — G4733 Obstructive sleep apnea (adult) (pediatric): Secondary | ICD-10-CM | POA: Diagnosis present

## 2023-09-28 DIAGNOSIS — Z7902 Long term (current) use of antithrombotics/antiplatelets: Secondary | ICD-10-CM

## 2023-09-28 DIAGNOSIS — I2510 Atherosclerotic heart disease of native coronary artery without angina pectoris: Secondary | ICD-10-CM | POA: Diagnosis present

## 2023-09-28 DIAGNOSIS — Z9981 Dependence on supplemental oxygen: Secondary | ICD-10-CM | POA: Diagnosis not present

## 2023-09-28 DIAGNOSIS — E739 Lactose intolerance, unspecified: Secondary | ICD-10-CM | POA: Diagnosis present

## 2023-09-28 DIAGNOSIS — R7303 Prediabetes: Secondary | ICD-10-CM | POA: Diagnosis present

## 2023-09-28 DIAGNOSIS — I4719 Other supraventricular tachycardia: Secondary | ICD-10-CM | POA: Diagnosis present

## 2023-09-28 DIAGNOSIS — L304 Erythema intertrigo: Secondary | ICD-10-CM | POA: Diagnosis present

## 2023-09-28 DIAGNOSIS — I10 Essential (primary) hypertension: Secondary | ICD-10-CM | POA: Diagnosis present

## 2023-09-28 DIAGNOSIS — Z880 Allergy status to penicillin: Secondary | ICD-10-CM | POA: Diagnosis not present

## 2023-09-28 DIAGNOSIS — Z955 Presence of coronary angioplasty implant and graft: Secondary | ICD-10-CM

## 2023-09-28 DIAGNOSIS — N390 Urinary tract infection, site not specified: Secondary | ICD-10-CM | POA: Diagnosis present

## 2023-09-28 DIAGNOSIS — F419 Anxiety disorder, unspecified: Secondary | ICD-10-CM | POA: Diagnosis present

## 2023-09-28 DIAGNOSIS — R5383 Other fatigue: Secondary | ICD-10-CM | POA: Diagnosis present

## 2023-09-28 DIAGNOSIS — K5900 Constipation, unspecified: Secondary | ICD-10-CM | POA: Diagnosis present

## 2023-09-28 DIAGNOSIS — S8011XD Contusion of right lower leg, subsequent encounter: Secondary | ICD-10-CM

## 2023-09-28 DIAGNOSIS — D62 Acute posthemorrhagic anemia: Secondary | ICD-10-CM | POA: Insufficient documentation

## 2023-09-28 DIAGNOSIS — F418 Other specified anxiety disorders: Secondary | ICD-10-CM | POA: Diagnosis present

## 2023-09-28 DIAGNOSIS — Z7409 Other reduced mobility: Secondary | ICD-10-CM | POA: Diagnosis present

## 2023-09-28 DIAGNOSIS — S72001A Fracture of unspecified part of neck of right femur, initial encounter for closed fracture: Secondary | ICD-10-CM | POA: Diagnosis not present

## 2023-09-28 LAB — GLUCOSE, CAPILLARY
Glucose-Capillary: 91 mg/dL (ref 70–99)
Glucose-Capillary: 107 mg/dL — ABNORMAL HIGH (ref 70–99)
Glucose-Capillary: 101 mg/dL — ABNORMAL HIGH (ref 70–99)
Glucose-Capillary: 103 mg/dL — ABNORMAL HIGH (ref 70–99)

## 2023-09-28 MED ORDER — OXYCODONE HCL 5 MG PO TABS
5.0000 mg | ORAL_TABLET | ORAL | Status: DC | PRN
  Administered 2023-09-28 – 2023-10-08 (×29): 5 mg via ORAL
  Filled 2023-09-28 (×29): qty 1

## 2023-09-28 MED ORDER — DILTIAZEM HCL ER COATED BEADS 240 MG PO CP24
240.0000 mg | ORAL_CAPSULE | Freq: Every day | ORAL | Status: DC
  Administered 2023-09-29 – 2023-10-09 (×10): 240 mg via ORAL
  Filled 2023-09-28 (×11): qty 1

## 2023-09-28 MED ORDER — METOPROLOL TARTRATE 50 MG PO TABS
100.0000 mg | ORAL_TABLET | Freq: Two times a day (BID) | ORAL | Status: DC
  Filled 2023-09-28 (×2): qty 2

## 2023-09-28 MED ORDER — DOCUSATE SODIUM 100 MG PO CAPS: 100.0000 mg | ORAL_CAPSULE | Freq: Two times a day (BID) | ORAL | 0 refills | Status: DC

## 2023-09-28 MED ORDER — PANTOPRAZOLE SODIUM 40 MG PO TBEC
80.0000 mg | DELAYED_RELEASE_TABLET | Freq: Every day | ORAL | Status: DC
  Administered 2023-09-28 – 2023-10-08 (×11): 80 mg via ORAL
  Filled 2023-09-28 (×13): qty 2

## 2023-09-28 MED ORDER — PROCHLORPERAZINE 25 MG RE SUPP
12.5000 mg | Freq: Four times a day (QID) | RECTAL | Status: DC | PRN
  Filled 2023-09-28: qty 1

## 2023-09-28 MED ORDER — INSULIN ASPART 100 UNIT/ML IJ SOLN
0.0000 [IU] | Freq: Three times a day (TID) | INTRAMUSCULAR | Status: DC
  Administered 2023-09-30 – 2023-10-02 (×3): 1 [IU] via SUBCUTANEOUS

## 2023-09-28 MED ORDER — GUAIFENESIN-DM 100-10 MG/5ML PO SYRP
5.0000 mL | ORAL_SOLUTION | Freq: Four times a day (QID) | ORAL | Status: DC | PRN
  Administered 2023-10-06 – 2023-10-07 (×2): 10 mL via ORAL
  Filled 2023-09-28 (×2): qty 10

## 2023-09-28 MED ORDER — ADULT MULTIVITAMIN W/MINERALS CH: 1.0000 | ORAL_TABLET | Freq: Every day | ORAL | Status: DC

## 2023-09-28 MED ORDER — KATE FARMS STANDARD 1.4 PO LIQD
325.0000 mL | Freq: Two times a day (BID) | ORAL | Status: DC
Start: 2023-09-29 — End: 2023-10-09
  Administered 2023-10-04 – 2023-10-06 (×4): 325 mL via ORAL
  Filled 2023-09-28 (×22): qty 325

## 2023-09-28 MED ORDER — ROSUVASTATIN CALCIUM 20 MG PO TABS
20.0000 mg | ORAL_TABLET | Freq: Every day | ORAL | Status: DC
  Administered 2023-09-29 – 2023-10-09 (×11): 20 mg via ORAL
  Filled 2023-09-28 (×11): qty 1

## 2023-09-28 MED ORDER — BISACODYL 10 MG RE SUPP: 10.0000 mg | Freq: Every day | RECTAL | 0 refills | Status: DC | PRN

## 2023-09-28 MED ORDER — ACETAMINOPHEN 325 MG PO TABS: 325.0000 mg | ORAL_TABLET | ORAL | Status: DC | PRN

## 2023-09-28 MED ORDER — KATE FARMS STANDARD 1.4 PO LIQD: 325.0000 mL | Freq: Two times a day (BID) | ORAL | Status: DC

## 2023-09-28 MED ORDER — DIPHENHYDRAMINE HCL 25 MG PO CAPS: 25.0000 mg | ORAL_CAPSULE | Freq: Four times a day (QID) | ORAL | Status: DC | PRN

## 2023-09-28 MED ORDER — ACETAMINOPHEN 325 MG PO TABS
650.0000 mg | ORAL_TABLET | Freq: Three times a day (TID) | ORAL | Status: DC
  Administered 2023-09-28 – 2023-09-30 (×6): 650 mg via ORAL
  Filled 2023-09-28 (×8): qty 2

## 2023-09-28 MED ORDER — ALPRAZOLAM 0.5 MG PO TABS
2.0000 mg | ORAL_TABLET | Freq: Every evening | ORAL | Status: DC | PRN
  Administered 2023-09-28 – 2023-10-08 (×11): 2 mg via ORAL
  Filled 2023-09-28 (×11): qty 4

## 2023-09-28 MED ORDER — ALUM & MAG HYDROXIDE-SIMETH 200-200-20 MG/5ML PO SUSP: 30.0000 mL | ORAL | 0 refills | Status: DC | PRN

## 2023-09-28 MED ORDER — PROCHLORPERAZINE MALEATE 5 MG PO TABS
5.0000 mg | ORAL_TABLET | Freq: Four times a day (QID) | ORAL | Status: DC | PRN
  Administered 2023-10-07: 5 mg via ORAL
  Filled 2023-09-28: qty 2

## 2023-09-28 MED ORDER — MELATONIN 3 MG PO TABS
3.0000 mg | ORAL_TABLET | Freq: Every day | ORAL | Status: DC
  Administered 2023-09-28 – 2023-10-07 (×4): 3 mg via ORAL
  Filled 2023-09-28 (×10): qty 1

## 2023-09-28 MED ORDER — FLEET ENEMA RE ENEM: 1.0000 | ENEMA | Freq: Once | RECTAL | Status: DC | PRN

## 2023-09-28 MED ORDER — ALUM & MAG HYDROXIDE-SIMETH 200-200-20 MG/5ML PO SUSP: 30.0000 mL | ORAL | Status: DC | PRN

## 2023-09-28 MED ORDER — INSULIN ASPART 100 UNIT/ML IJ SOLN: 0.0000 [IU] | Freq: Every day | INTRAMUSCULAR | Status: DC

## 2023-09-28 MED ORDER — POLYETHYLENE GLYCOL 3350 17 G PO PACK: 17.0000 g | PACK | Freq: Every day | ORAL | 0 refills | Status: DC | PRN

## 2023-09-28 MED ORDER — ENOXAPARIN SODIUM 40 MG/0.4ML IJ SOSY
40.0000 mg | PREFILLED_SYRINGE | INTRAMUSCULAR | Status: DC
  Administered 2023-09-29 – 2023-10-03 (×5): 40 mg via SUBCUTANEOUS
  Filled 2023-09-28 (×6): qty 0.4

## 2023-09-28 MED ORDER — ADULT MULTIVITAMIN W/MINERALS CH
1.0000 | ORAL_TABLET | Freq: Every day | ORAL | Status: DC
  Administered 2023-09-29: 1 via ORAL
  Filled 2023-09-28: qty 1

## 2023-09-28 MED ORDER — PROCHLORPERAZINE EDISYLATE 10 MG/2ML IJ SOLN: 5.0000 mg | Freq: Four times a day (QID) | INTRAMUSCULAR | Status: DC | PRN

## 2023-09-28 MED ORDER — BISACODYL 10 MG RE SUPP: 10.0000 mg | Freq: Every day | RECTAL | Status: DC | PRN

## 2023-09-28 MED ORDER — VENLAFAXINE HCL ER 75 MG PO CP24
75.0000 mg | ORAL_CAPSULE | Freq: Every day | ORAL | Status: DC
  Administered 2023-09-29 – 2023-10-09 (×11): 75 mg via ORAL
  Filled 2023-09-28 (×11): qty 1

## 2023-09-28 MED ORDER — METHOCARBAMOL 500 MG PO TABS: 500.0000 mg | ORAL_TABLET | Freq: Four times a day (QID) | ORAL | Status: DC | PRN

## 2023-09-28 MED ORDER — CLOPIDOGREL BISULFATE 75 MG PO TABS
75.0000 mg | ORAL_TABLET | Freq: Every day | ORAL | Status: DC
  Administered 2023-09-29 – 2023-10-09 (×11): 75 mg via ORAL
  Filled 2023-09-28 (×11): qty 1

## 2023-09-28 NOTE — Progress Notes (Signed)
Patient discharged to inpatient rehab at [facility], RN called report to receiving hospital, RN also provided report to [facility] at bedside, discharge paperwork and transfer paperwork sent with [facility], IV kept in, RN educated patient on reason for transfer, patient verbalized understanding, patient belongings sent with patient to [facility].

## 2023-09-28 NOTE — Plan of Care (Signed)
  Problem: Education: Goal: Knowledge of General Education information will improve Description: Including pain rating scale, medication(s)/side effects and non-pharmacologic comfort measures Outcome: Progressing   Problem: Health Behavior/Discharge Planning: Goal: Ability to manage health-related needs will improve Outcome: Progressing   Problem: Clinical Measurements: Goal: Ability to maintain clinical measurements within normal limits will improve Outcome: Progressing Goal: Will remain free from infection Outcome: Progressing Goal: Diagnostic test results will improve Outcome: Progressing Goal: Respiratory complications will improve Outcome: Progressing Goal: Cardiovascular complication will be avoided Outcome: Progressing   Problem: Activity: Goal: Risk for activity intolerance will decrease Outcome: Progressing   Problem: Nutrition: Goal: Adequate nutrition will be maintained Outcome: Progressing   Problem: Coping: Goal: Level of anxiety will decrease Outcome: Progressing   Problem: Elimination: Goal: Will not experience complications related to bowel motility Outcome: Progressing Goal: Will not experience complications related to urinary retention Outcome: Progressing   Problem: Pain Managment: Goal: General experience of comfort will improve Outcome: Progressing   Problem: Safety: Goal: Ability to remain free from injury will improve Outcome: Progressing   Problem: Skin Integrity: Goal: Risk for impaired skin integrity will decrease Outcome: Progressing   Problem: Education: Goal: Verbalization of understanding the information provided (i.e., activity precautions, restrictions, etc) will improve Outcome: Progressing Goal: Individualized Educational Video(s) Outcome: Progressing   Problem: Activity: Goal: Ability to ambulate and perform ADLs will improve Outcome: Progressing   Problem: Clinical Measurements: Goal: Postoperative complications will be  avoided or minimized Outcome: Progressing   Problem: Self-Concept: Goal: Ability to maintain and perform role responsibilities to the fullest extent possible will improve Outcome: Progressing   Problem: Pain Management: Goal: Pain level will decrease Outcome: Progressing   Problem: Education: Goal: Ability to describe self-care measures that may prevent or decrease complications (Diabetes Survival Skills Education) will improve Outcome: Progressing Goal: Individualized Educational Video(s) Outcome: Progressing   Problem: Coping: Goal: Ability to adjust to condition or change in health will improve Outcome: Progressing   Problem: Fluid Volume: Goal: Ability to maintain a balanced intake and output will improve Outcome: Progressing   Problem: Health Behavior/Discharge Planning: Goal: Ability to identify and utilize available resources and services will improve Outcome: Progressing Goal: Ability to manage health-related needs will improve Outcome: Progressing   Problem: Metabolic: Goal: Ability to maintain appropriate glucose levels will improve Outcome: Progressing   Problem: Nutritional: Goal: Maintenance of adequate nutrition will improve Outcome: Progressing Goal: Progress toward achieving an optimal weight will improve Outcome: Progressing   Problem: Skin Integrity: Goal: Risk for impaired skin integrity will decrease Outcome: Progressing   Problem: Tissue Perfusion: Goal: Adequacy of tissue perfusion will improve Outcome: Progressing   

## 2023-09-28 NOTE — Progress Notes (Signed)
Inpatient Rehabilitation Admission Medication Review by a Pharmacist  A complete drug regimen review was completed for this patient to identify any potential clinically significant medication issues.  High Risk Drug Classes Is patient taking? Indication by Medication  Antipsychotic Yes PRN prochlorperazine - nausea/vomiting  Anticoagulant Yes Enoxaparin - VTE prophylaxis  Antibiotic No   Opioid Yes PRN Oxycodone - severe pain  Antiplatelet Yes Clopidogrel - hx cardiac stents  Hypoglycemics/insulin Yes SSI - glucose control  Vasoactive Medication Yes Diltiazem CD, Metoprolol - hypertension, hx atrial tachycardia  Chemotherapy No   Other Yes Acetaminophen (scheduled) - pain Melatonin - sleep Pantoprazole - GERD Rosuvastatin - hyperlipidemia Venlafaxine - anxiety/depression MVI - supplement  PRNs: Xanax - anxiety Maalox - indigestion Diphehnydramine - itching Guaifenesin-dextromethorphan - cough Methocarbamol - muscle spasms Bisacodyl, Fleets enema - constipation     Type of Medication Issue Identified Description of Issue Recommendation(s)  Drug Interaction(s) (clinically significant)     Duplicate Therapy     Allergy     No Medication Administration End Date     Incorrect Dose     Additional Drug Therapy Needed     Significant med changes from prior encounter (inform family/care partners about these prior to discharge). Off aspirin 81 mg daily since admitted, to resume per discharge summary.  Hydrochlorothiazide discontinued during inpatient admission. Resume aspirin when appropriate.  Communicate changes with patient/family prior to discharge  Other  Therapeutic substitutions: - pantoprazole for esomeprazole - venlafaxine for desvenlafaxine  Off Metformin; prescribed as outpatient but reported not yet starting. Off Calcium + Vitamin D supplement Resume home meds at discharge.    Resume at discharge or while on CIR if clinically indicated.  On SSI.     Clinically significant medication issues were identified that warrant physician communication and completion of prescribed/recommended actions by midnight of the next day:  No  Pharmacist comments:  - post-op blood loss and transfusion given while inpatient. - Plavix held 10/4 thru 10/6, resumed 10/7. Off PTA aspirin 81 mg daily. Resume when able. - Enoxaparin 40 mg SQ q24h added on transfer to CIR; platelet count low 115K, monitor  Time spent performing this drug regimen review (minutes):  [address]   Dennie Fetters, [place] 09/28/2023 4:12 PM

## 2023-09-28 NOTE — Progress Notes (Signed)
PMR Admission Coordinator Pre-Admission Assessment   Patient: Carrie Chung is an 82 y.o., female MRN: [id number] DOB: [birth year] Height: 5' (152.4 cm) Weight: 71.4 kg   Insurance Information HMO:     PPO:      PCP:      IPA:      80/20:      OTHER:  PRIMARY: Medicare A and B      Policy#: [id number]      Subscriber: patient CM Name:        Phone#:       Fax#:   Pre-Cert#:        Employer: Retired Benefits:  Phone #:       Name: Checked in passport one source [employer]. Date: 08/21/05     Deduct: $1632      Out of Pocket Max:  none      Life Max: n/z CIR: 100%      SNF: 100 days Outpatient: 80%     Co-Pay: 20% Home Health: 100%      Co-Pay: none DME: 80%     Co-Pay: 20% Providers: patient's choice  SECONDARY: BCBS supplement      Policy#: [id number]     Phone#: [phone]   Financial Counselor:       Phone#:    The "Data Collection Information Summary" for patients in Inpatient Rehabilitation Facilities with attached "Privacy Act Statement-Health Care Records" was provided and verbally reviewed with: Patient   Emergency Contact Information Contact Information       Name Relation Home Work Mobile    Deboard,Camilla Daughter     [phone]         Other Contacts   None on File        Current Medical History  Patient Admitting Diagnosis: R hip fracture   History of Present Illness:  An 82 y.o. female with medical history significant of CAD, NSTEMI s/p stents, HTN, HLD.  Pt in to [facility] ED on 09/20/23  with R hip pain after mechanical fall at home.  Pain severe, unable to bear wt.  Also has R shoulder pain.  Denies head trauma, denies LOC.  Fall was due to tripping up over stairs.  Underwent IM nailing right hip on 09/22/23 by Dr. Margarita Rana.  PT/OT evaluations were completed post op with recommendations for acute inpatient rehab admission.   Patient's medical record from [facility] has been reviewed by the rehabilitation admission  coordinator and physician.   Past Medical History      Past Medical History:  Diagnosis Date   Atrial tachycardia (HCC)      a. noted at cardiac rehab 5/13; event monitor ordered to assess for AFib   CAD (coronary artery disease)      a. NSTEMI 03/2012 ([facility]HC 03/27/12: pLAD 30%, mLAD 99%, mCFX 95%, mRCA 99% with thrombus, EF 65%);  s/p PTCA/DESx1 to prox-mid RCA 03/27/12 urgently in setting of hypotension/bradycardia, with staged PTCA/Evolve study stent to mid LAD & PTCA/Evolve study stent to prox LCx 03/29/12 ;   echo 03/28/12:EF 60%, Aortic sclerosis without AS, mild RVE, mild reduced RVSF     Endometrial polyp     HTN (hypertension)     Hyperlipidemia     MI, old     Sleep related hypoxia            Has the patient had major surgery during 100 days prior to admission? Yes   Family History   family history includes Cancer -  Other in her father; Colon cancer in her father; Coronary artery disease in her mother.   Current Medications  Current Medications    Current Facility-Administered Medications:    acetaminophen (TYLENOL) tablet 325-650 mg, 325-650 mg, Oral, Q6H PRN, Gawne, Meghan M, PA-C   ALPRAZolam Prudy Feeler) tablet 2 mg, 2 mg, Oral, QHS PRN, Gawne, Meghan M, PA-C   alum & mag hydroxide-simeth (MAALOX/MYLANTA) 200-200-20 MG/5ML suspension 30 mL, 30 mL, Oral, Q4H PRN, Gawne, Meghan M, PA-C, 30 mL at 09/22/23 2058   bisacodyl (DULCOLAX) suppository 10 mg, 10 mg, Rectal, Daily PRN, Gawne, Meghan M, PA-C   diltiazem (CARDIZEM CD) 24 hr capsule 240 mg, 240 mg, Oral, Daily, Gawne, Meghan M, PA-C, 240 mg at 09/24/23 0809   docusate sodium (COLACE) capsule 100 mg, 100 mg, Oral, BID, Gawne, Meghan M, PA-C, 100 mg at 09/24/23 0809   feeding supplement (KATE FARMS STANDARD 1.4) liquid 325 mL, 325 mL, Oral, BID BM, Alwyn Ren, MD   HYDROmorphone (DILAUDID) injection 0.5 mg, 0.5 mg, Intravenous, Q6H PRN, Alwyn Ren, MD   insulin aspart (novoLOG) injection 0-5 Units, 0-5 Units,  Subcutaneous, QHS, Hall, Carole N, DO, 2 Units at 09/22/23 2208   insulin aspart (novoLOG) injection 0-9 Units, 0-9 Units, Subcutaneous, TID WC, Hall, Carole N, DO, 1 Units at 09/24/23 1204   menthol-cetylpyridinium (CEPACOL) lozenge 3 mg, 1 lozenge, Oral, PRN **OR** phenol (CHLORASEPTIC) mouth spray 1 spray, 1 spray, Mouth/Throat, PRN, Gawne, Meghan M, PA-C   methocarbamol (ROBAXIN) tablet 500 mg, 500 mg, Oral, Q6H PRN, 500 mg at 09/23/23 0958 **OR** methocarbamol (ROBAXIN) 500 mg in dextrose 5 % 50 mL IVPB, 500 mg, Intravenous, Q6H PRN, Gawne, Meghan M, PA-C   metoCLOPramide (REGLAN) tablet 5-10 mg, 5-10 mg, Oral, Q8H PRN, 10 mg at 09/24/23 0849 **OR** metoCLOPramide (REGLAN) injection 5-10 mg, 5-10 mg, Intravenous, Q8H PRN, Gawne, Meghan M, PA-C   metoprolol tartrate (LOPRESSOR) tablet 100 mg, 100 mg, Oral, BID, Gawne, Meghan M, PA-C, 100 mg at 09/24/23 0809   multivitamin with minerals tablet 1 tablet, 1 tablet, Oral, Daily, Alwyn Ren, MD, 1 tablet at 09/24/23 9528   oxyCODONE (Oxy IR/ROXICODONE) immediate release tablet 10 mg, 10 mg, Oral, Q4H PRN, Gawne, Meghan M, PA-C, 10 mg at 09/24/23 4132   oxyCODONE (Oxy IR/ROXICODONE) immediate release tablet 5 mg, 5 mg, Oral, Q4H PRN, Gawne, Meghan M, PA-C, 5 mg at 09/23/23 2234   pantoprazole (PROTONIX) EC tablet 80 mg, 80 mg, Oral, Q1200, Levester Fresh M, PA-C, 80 mg at 09/23/23 1611   polyethylene glycol (MIRALAX / GLYCOLAX) packet 17 g, 17 g, Oral, Daily PRN, Gawne, Meghan M, PA-C   rosuvastatin (CRESTOR) tablet 20 mg, 20 mg, Oral, Daily, Gawne, Meghan M, PA-C, 20 mg at 09/24/23 0809   scopolamine (TRANSDERM-SCOP) 1 MG/3DAYS 1.5 mg, 1 patch, Transdermal, Q72H, Alwyn Ren, MD, 1.5 mg at 09/24/23 0936   venlafaxine XR (EFFEXOR-XR) 24 hr capsule 75 mg, 75 mg, Oral, Q breakfast, Gawne, Meghan M, PA-C, 75 mg at 09/24/23 0809     Patients Current Diet:  Diet Order                  Diet heart healthy/carb modified Room service  appropriate? Yes; Fluid consistency: Thin  Diet effective now                         Precautions / Restrictions Precautions Precautions: Fall Precaution Comments: bladder incontinence, watch SpO2 Restrictions Weight Bearing  Restrictions: Yes RLE Weight Bearing: Weight bearing as tolerated Other Position/Activity Restrictions: WBAT    Has the patient had 2 or more falls or a fall with injury in the past year? Yes   Prior Activity Level Community (5-7x/wk): Went out daily, was driving.   Prior Functional Level Self Care: Did the patient need help bathing, dressing, using the toilet or eating? Needed some help   Indoor Mobility: Did the patient need assistance with walking from room to room (with or without device)? Independent   Stairs: Did the patient need assistance with internal or external stairs (with or without device)? Needed some help   Functional Cognition: Did the patient need help planning regular tasks such as shopping or remembering to take medications? Independent   Patient Information Are you of Hispanic, Latino/a,or Spanish origin?: A. No, not of Hispanic, Latino/a, or Spanish origin What is your race?: A. White Do you need or want an interpreter to communicate with a doctor or health care staff?: 0. No   Patient's Response To:  Health Literacy and Transportation Is the patient able to respond to health literacy and transportation needs?: Yes Health Literacy - How often do you need to have someone help you when you read instructions, pamphlets, or other written material from your doctor or pharmacy?: Never In the past 12 months, has lack of transportation kept you from medical appointments or from getting medications?: No In the past 12 months, has lack of transportation kept you from meetings, work, or from getting things needed for daily living?: No   Home Assistive Devices / Equipment Home Equipment: Agricultural consultant (2 wheels), Hand held shower head, Cane  - single point, Crutches, Tub bench, BSC/3in1, Other (comment) (leg lifter, home oxygen)   Prior Device Use: Indicate devices/aids used by the patient prior to current illness, exacerbation or injury?  Single point cane   Current Functional Level Cognition   Overall Cognitive Status: Impaired/Different from baseline Current Attention Level: Sustained Orientation Level: Oriented X4 Following Commands: Follows one step commands consistently General Comments: pt reported feeling a little off or fuzzy during tx session and apparent confusion noted    Extremity Assessment (includes Sensation/Coordination)   Upper Extremity Assessment: Generalized weakness  Lower Extremity Assessment: Generalized weakness, LLE deficits/detail LLE Deficits / Details: history of Left femur surgery. pt reports limited hip flexion at baseline.     ADLs   Overall ADL's : Needs assistance/impaired Grooming: Set up, Sitting Upper Body Bathing: Set up, Sitting Lower Body Bathing: Maximal assistance, Sit to/from stand Upper Body Dressing : Set up, Sitting Lower Body Dressing: Total assistance, Sit to/from stand Toilet Transfer: Minimal assistance, Rolling walker (2 wheels), BSC/3in1 Toilet Transfer Details (indicate cue type and reason): 2 person available for safety although not needed for physical assist. Toileting- Clothing Manipulation and Hygiene: Total assistance, Sit to/from stand     Mobility   Overal bed mobility: Needs Assistance Bed Mobility: Supine to Sit Supine to sit: Min assist, HOB elevated, Used rails (leg lifter) General bed mobility comments: pt has personal leg lifter and required instruction on use of proper placement, pt able to manuver R LE to EOB, increased time and cues for scooting hips to EOB     Transfers   Overall transfer level: Needs assistance Equipment used: Rolling walker (2 wheels) Transfers: Sit to/from Stand, Bed to chair/wheelchair/BSC Sit to Stand: Mod assist, +2  physical assistance, Min assist Bed to/from chair/wheelchair/BSC transfer type:: Step pivot Step pivot transfers: Contact guard assist, +2 physical  assistance Transfer via Lift Equipment: Stedy General transfer comment: Sit to stand transition from bed utilizing RW required Mod Ax2. Sit to stand from [facility] with RW only required Min AX1 d/t higher surface. VC provided for hand placement with RW management prior to sit<>stand transitions. VC to kick out RLE while sitting to decrease pain.     Ambulation / Gait / Stairs / Wheelchair Mobility   Ambulation/Gait Ambulation/Gait assistance: Min assist, +2 physical assistance Gait Distance (Feet): 3 Feet Assistive device: Rolling walker (2 wheels) Gait Pattern/deviations: Step-to pattern, Decreased step length - right, Decreased step length - left, Antalgic General Gait Details: small steps with RW to get to recliner, mod visual cues for sequencing and turning enough to be able to complete stand to sit transfer to recliner. PT provided pt with youth RW Gait velocity: decreased     Posture / Balance Balance Overall balance assessment: History of Falls, Needs assistance Sitting-balance support: Single extremity supported, Feet supported Sitting balance-Leahy Scale: Fair Standing balance support: Bilateral upper extremity supported, Reliant on assistive device for balance Standing balance-Leahy Scale: Poor     Special needs/care consideration Skin post op incision right hip with dressing and Special service needs none    Previous Home Environment (from acute therapy documentation) Living Arrangements: Spouse/significant other Available Help at Discharge: Personal care attendant, Other (Comment) (Aide assistance 5X/week 9A-5P. They assist with her husband (dementia) although they are able to assist her as well.) Type of Home: House Home Layout: Two level, Bed/bath upstairs, Other (Comment), Able to live on main level with bedroom/bathroom Alternate  Level Stairs-Rails: Right Alternate Level Stairs-Number of Steps: 14 Home Access: Stairs to enter Entrance Stairs-Rails: None Entrance Stairs-Number of Steps: 3 in front, 4 in garage, now has ramp in the front too but usually uses garage entrance. Ramp entrance has 1 step per daughter Bathroom Shower/Tub: Engineer, manufacturing systems: Standard Bathroom Accessibility: Yes Home Care Services: Yes Type of Home Care Services: Home RN, Comptroller Additional Comments: downstairs does not have walk in shower, has to scoot into tub using shower bench downstairs   Discharge Living Setting Plans for Discharge Living Setting: Patient's home, House, Lives with (comment) (Lives with spouse.) Type of Home at Discharge: House Discharge Home Layout: Two level, Able to live on main level with bedroom/bathroom Alternate Level Stairs-Number of Steps: Flight to 2nd level Discharge Home Access: Ramped entrance Discharge Bathroom Shower/Tub: Tub/shower unit, Curtain Discharge Bathroom Toilet: Standard Discharge Bathroom Accessibility: Yes How Accessible: Accessible via walker Does the patient have any problems obtaining your medications?: No   Social/Family/Support Systems Patient Roles: Spouse, Parent, Other (Comment) (Has daughter and husband with dementia) Contact Information: Claudean Severance - daughter - [phone] Anticipated Caregiver: Caregivers daily and can hire help as needed Ability/Limitations of Caregiver: Husband has caregivers during the day for early dementia and can hire caregivers as needed. Caregiver Availability: 24/7 Discharge Plan Discussed with Primary Caregiver: Yes Is Caregiver In Agreement with Plan?: Yes Does Caregiver/Family have Issues with Lodging/Transportation while Pt is in Rehab?: No   Goals Patient/Family Goal for Rehab: PT/OT mod I and supervision goals Expected length of stay: 10-12 days Pt/Family Agrees to Admission and willing to participate: Yes Program  Orientation Provided & Reviewed with Pt/Caregiver Including Roles  & Responsibilities: Yes   Decrease burden of Care through IP rehab admission: N/A   Possible need for SNF placement upon discharge: not planned   Patient Condition: I have reviewed medical records from [facility], spoken with CM,  and daughter. I discussed via phone for inpatient rehabilitation assessment.  Patient will benefit from ongoing PT and OT, can actively participate in 3 hours of therapy a day 5 days of the week, and can make measurable gains during the admission.  Patient will also benefit from the coordinated team approach during an Inpatient Acute Rehabilitation admission.  The patient will receive intensive therapy as well as Rehabilitation physician, nursing, social worker, and care management interventions.  Due to bladder management, bowel management, safety, skin/wound care, disease management, medication administration, pain management, and patient education the patient requires 24 hour a day rehabilitation nursing.  The patient is currently min-mod A  with mobility and basic ADLs.  Discharge setting and therapy post discharge at home with home health is anticipated.  Patient has agreed to participate in the Acute Inpatient Rehabilitation Program and will admit today.   Preadmission Screen Completed By:  Trish Mage, 09/24/2023 12:42 PM ______________________________________________________________________   Discussed status with Dr. Berline Chough  on 09/28/23 at  930 and received approval for admission today.   Admission Coordinator:  Trish Mage, RN, time 1610 /Date 09/28/23    Assessment/Plan: Diagnosis: R hip fracture mechanical fall Does the need for close, 24 hr/day Medical supervision in concert with the patient's rehab needs make it unreasonable for this patient to be served in a less intensive setting? Yes Co-Morbidities requiring supervision/potential complications: HTN, , HLD, CAD s/p  NSTEMI and stents; BMI 31; pos top pain Due to bladder management, bowel management, safety, skin/wound care, disease management, medication administration, pain management, and patient education, does the patient require 24 hr/day rehab nursing? Yes Does the patient require coordinated care of a physician, rehab nurse, PT, OT, and SLP to address physical and functional deficits in the context of the above medical diagnosis(es)? Yes Addressing deficits in the following areas: balance, endurance, locomotion, strength, transferring, bowel/bladder control, bathing, dressing, feeding, grooming, and toileting Can the patient actively participate in an intensive therapy program of at least 3 hrs of therapy 5 days a week? Yes The potential for patient to make measurable gains while on inpatient rehab is good Anticipated functional outcomes upon discharge from inpatient rehab: modified independent and supervision PT, modified independent and supervision OT, n/a SLP Estimated rehab length of stay to reach the above functional goals is: 10-12 days Anticipated discharge destination: Home 10. Overall Rehab/Functional Prognosis: good     MD Signature:

## 2023-09-28 NOTE — Progress Notes (Signed)
Physical Therapy Treatment Patient Details Name: RHINA HOLLINGER MRN: [id number] DOB: [birth year] Today's Date: 09/28/2023   History of Present Illness 82 yo female who presented to the ED on 09/21/23 after mechanical fall up stairs at home. Found to have R hip intertrochanteric fracture, received R IM nail 09/21/23. PMH hx Atrial tachycardia, CAD, HTN, HLD, MI, carotid stent, L IM nail placement    PT Comments    Pt admitted with above diagnosis.  Pt currently with functional limitations due to the deficits listed below (see PT Problem List). Pt in bed when PT arrived. Pt agreeable to therapy pt reports anticipated d/c to CIR today. Pt reports 5/10 R hip pain. Pt required increased time, cues, use of hospital bed and personal leg lifter for supine to sit with CGA. Pt required cues and CGA for sit to stand  from EOB to RW. Pt required RW, CGA for gait tasks in hallway with pt demonstrating improved gait tolerance with 85 feet with occasional standing rest breaks due to fatigue. Pt left seated in recliner, CP on R hip and all needs in place. PT upgraded goals. Pt will benefit from acute skilled PT to increase their independence and safety with mobility to allow discharge.      If plan is discharge home, recommend the following: Help with stairs or ramp for entrance;Assist for transportation;Assistance with cooking/housework;A little help with walking and/or transfers;A little help with bathing/dressing/bathroom   Can travel by private vehicle        Equipment Recommendations  None recommended by PT    Recommendations for Other Services       Precautions / Restrictions Precautions Precautions: Fall Precaution Comments: bladder incontinence Restrictions Weight Bearing Restrictions: No RLE Weight Bearing: Weight bearing as tolerated Other Position/Activity Restrictions: WBAT     Mobility  Bed Mobility Overal bed mobility: Needs Assistance Bed Mobility: Supine to Sit     Supine to  sit: HOB elevated, Used rails, Contact guard (leg lifter)     General bed mobility comments: pt in bed when PT arrived and required cues for use of bed rails, increased time and encouragement for IND with supine to sit    Transfers Overall transfer level: Needs assistance Equipment used: Rolling walker (2 wheels) Transfers: Sit to/from Stand Sit to Stand: Min assist           General transfer comment: cues for proper UE placement and extension posture once in standing    Ambulation/Gait Ambulation/Gait assistance: Contact guard assist Gait Distance (Feet): 85 Feet Assistive device: Rolling walker (2 wheels) Gait Pattern/deviations: Step-to pattern, Antalgic, Decreased stance time - right Gait velocity: decreased     General Gait Details: verbal cues for sequence, RW positioning, posture, distance to tolerance; a couple short standing rest breaks due to fatigue   Stairs             Wheelchair Mobility     Tilt Bed    Modified Rankin (Stroke Patients Only)       Balance Overall balance assessment: History of Falls, Needs assistance Sitting-balance support: Single extremity supported, Feet supported Sitting balance-Leahy Scale: Good     Standing balance support: Bilateral upper extremity supported, Reliant on assistive device for balance Standing balance-Leahy Scale: Poor                              Cognition Arousal: Alert Behavior During Therapy: WFL for tasks assessed/performed Overall Cognitive Status: Within Functional  Limits for tasks assessed                                          Exercises      General Comments        Pertinent Vitals/Pain Pain Assessment Pain Assessment: 0-10 Pain Score: 5  Pain Location: right thigh/hip Pain Descriptors / Indicators: Tender, Sore, Discomfort Pain Intervention(s): Limited activity within patient's tolerance, Monitored during session, Repositioned, Patient requesting  pain meds-RN notified, Ice applied    Home Living Family/patient expects to be discharged to:: Inpatient rehab Living Arrangements: Spouse/significant other Available Help at Discharge: Personal care attendant;Other (Comment) (Aide assistance 5X/week 9A-5P. They assist with her husband (dementia) although they are able to assist her as well.) Type of Home: House Home Access: Stairs to enter Entrance Stairs-Rails: None Entrance Stairs-Number of Steps: 3 in front, 4 in garage, now has ramp in the front too but usually uses garage entrance. Ramp entrance has 1 step per daughter Alternate Level Stairs-Number of Steps: 14 Home Layout: Two level;Bed/bath upstairs;Other (Comment);Able to live on main level with bedroom/bathroom Home Equipment: Rolling Walker (2 wheels);Hand held shower head;Cane - single point;Crutches;Tub bench;BSC/3in1;Other (comment) (leg lifter, home oxygen) Additional Comments: downstairs does not have walk in shower, has to scoot into tub using shower bench downstairs    Prior Function            PT Goals (current goals can now be found in the care plan section) Acute Rehab PT Goals Patient Stated Goal: go to [facility] at [facility] cone PT Goal Formulation: With patient Time For Goal Achievement: 10/06/23 Potential to Achieve Goals: Good Progress towards PT goals: Progressing toward goals    Frequency    Min 1X/week      PT Plan      Co-evaluation              AM-PAC PT "6 Clicks" Mobility   Outcome Measure  Help needed turning from your back to your side while in a flat bed without using bedrails?: A Little Help needed moving from lying on your back to sitting on the side of a flat bed without using bedrails?: A Little Help needed moving to and from a bed to a chair (including a wheelchair)?: A Little Help needed standing up from a chair using your arms (e.g., wheelchair or bedside chair)?: A Little Help needed to walk in hospital room?: A Little Help  needed climbing 3-5 steps with a railing? : A Lot 6 Click Score: 17    End of Session Equipment Utilized During Treatment: Gait belt Activity Tolerance: Patient tolerated treatment well Patient left: in chair;with call bell/phone within reach;with chair alarm set Nurse Communication: Mobility status PT Visit Diagnosis: Difficulty in walking, not elsewhere classified (R26.2);Repeated falls (R29.6);Pain Pain - Right/Left: Right Pain - part of body: Hip     Time: 2725-3664 PT Time Calculation (min) (ACUTE ONLY): 23 min  Charges:    $Gait Training: 8-22 mins $Therapeutic Activity: 8-22 mins PT General Charges $$ ACUTE PT VISIT: 1 Visit                     Johnny Bridge, PT Acute Rehab    Jacqualyn Posey 09/28/2023, 1:07 PM

## 2023-09-28 NOTE — H&P (Signed)
Physical Medicine and Rehabilitation Admission H&P    Chief Complaint  Patient presents with   Functional deficits due to hip fracture.     HPI:  Carrie Chung is an 82 year old female with history of CAD, A trial tachycardia, HTN, OSA-intolerant of CPAP- on oxygen at nights for hypoxia, who was admitted on 09/20/23 after a mechanical fall with onset of right hip pain and inability to walk. She was found to have right IT femur  fracture and underwent IM nail 10/01 by Dr. Eulah Pont. She was noted to have significant hypokalemia at admission with K-2.6 which was supplemented. ABLA treated with one unit PRBC. Therapy has been limited due to pain and fatigue. She has had decline in functional status and CIR recommended due to functional decline.     Pt reports pain 3-4/10 at rest, but when transferred into bed, was 9/10 for short period- even after 2 Oxycodone before she came.   Did reports doesn't like hospital food, but thinks CBGs are "doing well" so should be able ot "eat outside food".   Pt reports feeling better s/p 2 units pRBCs.  LBM overnight- denies constipation.    Review of Systems  Constitutional:  Positive for malaise/fatigue. Negative for chills, fever and weight loss.  HENT: Negative.    Eyes: Negative.   Respiratory:  Negative for cough and shortness of breath.   Cardiovascular:  Negative for chest pain, orthopnea and claudication.  Gastrointestinal:  Negative for abdominal pain, constipation, nausea and vomiting.  Genitourinary:  Positive for frequency. Negative for dysuria and urgency.  Musculoskeletal:  Positive for joint pain and myalgias.  Skin: Negative.   Neurological:  Positive for focal weakness. Negative for sensory change.  Endo/Heme/Allergies: Negative.   Psychiatric/Behavioral: Negative.    All other systems reviewed and are negative.    Past Medical History:  Diagnosis Date   Atrial tachycardia (HCC)    a. noted at cardiac rehab 5/13; event  monitor ordered to assess for AFib   CAD (coronary artery disease)    a. NSTEMI 03/2012 (LHC 03/27/12: pLAD 30%, mLAD 99%, mCFX 95%, mRCA 99% with thrombus, EF 65%);  s/p PTCA/DESx1 to prox-mid RCA 03/27/12 urgently in setting of hypotension/bradycardia, with staged PTCA/Evolve study stent to mid LAD & PTCA/Evolve study stent to prox LCx 03/29/12 ;   echo 03/28/12:EF 60%, Aortic sclerosis without AS, mild RVE, mild reduced RVSF     Endometrial polyp    HTN (hypertension)    Hyperlipidemia    MI, old    Sleep related hypoxia     Past Surgical History:  Procedure Laterality Date   APPENDECTOMY  82 years old   CAROTID STENT  04/2012   x3   CESAREAN SECTION  1984   CHOLECYSTECTOMY  1978   FEMUR IM NAIL Right 09/21/2023   Procedure: INTRAMEDULLARY (IM) NAIL FEMORAL;  Surgeon: Sheral Apley, MD;  Location: [facility];  Service: Orthopedics;  Laterality: Right;   INTRAMEDULLARY (IM) NAIL INTERTROCHANTERIC Left 06/20/2021   Procedure: INTRAMEDULLARY (IM) NAIL INTERTROCHANTRIC;  Surgeon: Sheral Apley, MD;  Location: [facility] OR;  Service: Orthopedics;  Laterality: Left;   LEFT HEART CATHETERIZATION WITH CORONARY ANGIOGRAM N/A 03/27/2012   Procedure: LEFT HEART CATHETERIZATION WITH CORONARY ANGIOGRAM;  Surgeon: Kathleene Hazel, MD;  Location: [facility] CATH LAB;  Service: Cardiovascular;  Laterality: N/A;   PERCUTANEOUS CORONARY STENT INTERVENTION (PCI-S) N/A 03/27/2012   Procedure: PERCUTANEOUS CORONARY STENT INTERVENTION (PCI-S);  Surgeon: Kathleene Hazel, MD;  Location: [facility]  CATH LAB;  Service: Cardiovascular;  Laterality: N/A;   PERCUTANEOUS CORONARY STENT INTERVENTION (PCI-S) N/A 03/29/2012   Procedure: PERCUTANEOUS CORONARY STENT INTERVENTION (PCI-S);  Surgeon: Tonny Bollman, MD;  Location: [facility] CATH LAB;  Service: Cardiovascular;  Laterality: N/A;   TONSILLECTOMY  82 years old    Family History  Problem Relation Age of Onset   Coronary artery disease Mother        CABG at age 28, PPM   Colon cancer  Father    Cancer - Other Father        larnyx    Social History: Married. Retired Engineer, civil (consulting). Husband has dementia and aide 9am -5 pm who can assist after d/c. He  reports that she quit smoking about 43 years ago. Her smoking use included cigarettes. She has never used smokeless tobacco. She reports current alcohol use of about 20.0 standard drinks of alcohol per week. She reports that she does not use drugs.    Allergies  Allergen Reactions   Penicillins Hives, Swelling and Other (See Comments)   Sulfonamide Derivatives Hives and Swelling   Lactose Intolerance (Gi) Diarrhea   Sulfamethoxazole Swelling    Medications Prior to Admission  Medication Sig Dispense Refill   acetaminophen (TYLENOL) 325 MG tablet Take 1-2 tablets (325-650 mg total) by mouth every 4 (four) hours as needed for mild pain.     alprazolam (XANAX) 2 MG tablet TAKE 1 TABLET(2 MG) BY MOUTH AT BEDTIME AS NEEDED FOR SLEEP 30 tablet 2   aspirin 81 MG tablet Take 1 tablet (81 mg total) by mouth daily.     Calcium Carbonate-Vitamin D 600-400 MG-UNIT tablet Take 1 tablet by mouth every evening. 30 tablet 0   clopidogrel (PLAVIX) 75 MG tablet TAKE 1 TABLET(75 MG) BY MOUTH DAILY 90 tablet 3   desvenlafaxine (PRISTIQ) 50 MG 24 hr tablet TAKE 1 TABLET(50 MG) BY MOUTH DAILY 90 tablet 3   diltiazem (CARDIZEM CD) 240 MG 24 hr capsule Take 1 capsule (240 mg total) by mouth daily. 90 capsule 3   esomeprazole (NEXIUM) 40 MG capsule Take 1 capsule (40 mg total) by mouth daily at 12 noon. 30 capsule 0   hydrochlorothiazide (HYDRODIURIL) 25 MG tablet Take 1 tablet (25 mg total) by mouth daily. 90 tablet 3   metoprolol tartrate (LOPRESSOR) 100 MG tablet Take 1 tablet (100 mg total) by mouth 2 (two) times daily. 180 tablet 3   nitroGLYCERIN (NITROSTAT) 0.4 MG SL tablet Place 1 tablet (0.4 mg total) under the tongue every 5 (five) minutes as needed for chest pain. 25 tablet 10   OXYGEN Inhale into the lungs. Per patient using 2 liters at night.      prochlorperazine (COMPAZINE) 10 MG tablet Take 1 tablet (10 mg total) by mouth every 8 (eight) hours as needed for nausea or vomiting. 30 tablet 0   rosuvastatin (CRESTOR) 20 MG tablet TAKE 1 TABLET(20 MG) BY MOUTH DAILY 90 tablet 3   clotrimazole-betamethasone (LOTRISONE) cream Apply 1 Application topically daily. 45 g PRN   metFORMIN (GLUCOPHAGE) 500 MG tablet One tab by mouth daily with a meal (Patient not taking: Reported on 09/20/2023) 90 tablet 1      Home: Home Living Family/patient expects to be discharged to:: Inpatient rehab Living Arrangements: Spouse/significant other Available Help at Discharge: Personal care attendant, Other (Comment) (Aide assistance 5X/week 9A-5P. They assist with her husband (dementia) although they are able to assist her as well.) Type of Home: House Home Access: Stairs to enter [employer]  of Steps: 3 in front, 4 in garage, now has ramp in the front too but usually uses garage entrance. Ramp entrance has 1 step per daughter Entrance Stairs-Rails: None Home Layout: Two level, Bed/bath upstairs, Other (Comment), Able to live on main level with bedroom/bathroom Alternate Level Stairs-Number of Steps: 14 Alternate Level Stairs-Rails: Right Bathroom Shower/Tub: Engineer, manufacturing systems: Standard Bathroom Accessibility: Yes Home Equipment: Agricultural consultant (2 wheels), Hand held shower head, Cane - single point, Crutches, Tub bench, BSC/3in1, Other (comment) (leg lifter, home oxygen) Additional Comments: downstairs does not have walk in shower, has to scoot into tub using shower bench downstairs    Functional History: Prior Function Prior Level of Function : Needs assist Mobility Comments: was doing everything with just set up assist from home aides prior to this new fracture/surgery ADLs Comments: Pt reports that she was able to complete her bathing and dressing with just set-up from aide. Supervision provided for shower transfers. Pt  does not wear socks. Wear slip on shoes without heel back. Increased time needed to complete LB dressing while seated EOB.  Functional Status:  Mobility: Bed Mobility Overal bed mobility: Needs Assistance Bed Mobility: Supine to Sit Supine to sit: Min assist, HOB elevated, Used rails (leg lifter) General bed mobility comments: patient was up in the reclined and returned to the same. Transfers Overall transfer level: Needs assistance Equipment used: Rolling walker (2 wheels) Transfers: Sit to/from Stand Sit to Stand: Min assist Bed to/from chair/wheelchair/BSC transfer type:: Step pivot Step pivot transfers: Contact guard assist, +2 physical assistance Transfer via Lift Equipment: Stedy General transfer comment: verbal cues for hand placement, light assist to rise and steady Ambulation/Gait Ambulation/Gait assistance: Contact guard assist Gait Distance (Feet): 24 Feet Assistive device: Rolling walker (2 wheels) Gait Pattern/deviations: Step-to pattern, Antalgic, Decreased stance time - right General Gait Details: verbal cues for sequence, RW positioning, posture, distance to tolerance; a couple short standing rest breaks due to fatigue Gait velocity: decreased    ADL: ADL Overall ADL's : Needs assistance/impaired Grooming: Brushing hair, Supervision/safety, Sitting Grooming Details (indicate cue type and reason): with encouragement to get all sides. Upper Body Bathing: Set up, Sitting Upper Body Bathing Details (indicate cue type and reason): patient declined reporting she completed this already this AM. Lower Body Bathing: Maximal assistance, Sit to/from stand Upper Body Dressing : Set up, Sitting Lower Body Dressing: Total assistance, Sit to/from stand Toilet Transfer: Minimal assistance, Rolling walker (2 wheels), BSC/3in1 Toilet Transfer Details (indicate cue type and reason): 2 person available for safety although not needed for physical assist. Toileting- Clothing  Manipulation and Hygiene: Total assistance, Sit to/from stand  Cognition: Cognition Overall Cognitive Status: Within Functional Limits for tasks assessed Orientation Level: Oriented X4 Cognition Arousal: Alert Behavior During Therapy: WFL for tasks assessed/performed Overall Cognitive Status: Within Functional Limits for tasks assessed Area of Impairment: Memory, Attention, Following commands Current Attention Level: Sustained Memory: Decreased short-term memory Following Commands: Follows one step commands consistently General Comments: patient was noted to have some confusion and dizziness during session  Physical Exam: Blood pressure 132/75, pulse 78, temperature (!) 97.4 F (36.3 C), temperature source Oral, resp. rate 16, height 5' (1.524 m), weight 71.4 kg, SpO2 99%. Physical Exam Constitutional:      General: She is not in acute distress.    Appearance: Normal appearance. She is obese. She is not ill-appearing.     Comments: Awake, alert, meandering conversation, but Ox3; sitting up in bed; NAD  HENT:  Head: Normocephalic and atraumatic.     Right Ear: External ear normal.     Left Ear: External ear normal.     Nose: Nose normal. No congestion.     Mouth/Throat:     Mouth: Mucous membranes are dry.     Pharynx: Oropharynx is clear. No oropharyngeal exudate.  Eyes:     General:        Right eye: No discharge.        Left eye: No discharge.     Extraocular Movements: Extraocular movements intact.  Cardiovascular:     Rate and Rhythm: Normal rate and regular rhythm.     Heart sounds: Normal heart sounds. No murmur heard.    No gallop.  Pulmonary:     Effort: Pulmonary effort is normal. No respiratory distress.     Breath sounds: Normal breath sounds. No wheezing, rhonchi or rales.  Abdominal:     General: Bowel sounds are normal. There is no distension.     Palpations: Abdomen is soft.     Tenderness: There is no abdominal tenderness.  Genitourinary:     General: Normal vulva.  Musculoskeletal:     Cervical back: Neck supple. No tenderness.     Comments: Ue's 5/5 B/L LLE 5-/5 and RLE- HF 2/5; otherwise 5-/5 distally   Skin:    General: Skin is warm and dry.     Comments: Severe ecchymoses, purple/yellow bruising from R upper lateral hip to just below R lateral knee Medial R knee swelling Incisions no active bleeding- have steristrips- dried blood around incisions  Neurological:     Mental Status: She is alert.     Comments: Intact to light touch Ox3- but can be tangential  Psychiatric:        Behavior: Behavior normal.     Results for orders placed or performed during the hospital encounter of 09/20/23 (from the past 48 hour(s))  Glucose, capillary     Status: Abnormal   Collection Time: 09/26/23 11:27 AM  Result Value Ref Range   Glucose-Capillary 131 (H) 70 - 99 mg/dL    Comment: Glucose reference range applies only to samples taken after fasting for at least 8 hours.  Glucose, capillary     Status: Abnormal   Collection Time: 09/26/23  4:17 PM  Result Value Ref Range   Glucose-Capillary 158 (H) 70 - 99 mg/dL    Comment: Glucose reference range applies only to samples taken after fasting for at least 8 hours.  Glucose, capillary     Status: Abnormal   Collection Time: 09/26/23  9:17 PM  Result Value Ref Range   Glucose-Capillary 122 (H) 70 - 99 mg/dL    Comment: Glucose reference range applies only to samples taken after fasting for at least 8 hours.  CBC     Status: Abnormal   Collection Time: 09/27/23  4:13 AM  Result Value Ref Range   WBC 7.8 4.0 - 10.5 K/uL   RBC 3.38 (L) 3.87 - 5.11 MIL/uL   Hemoglobin 9.6 (L) 12.0 - 15.0 g/dL   HCT 16.1 (L) 09.6 - 04.5 %   MCV 91.4 80.0 - 100.0 fL   MCH 28.4 26.0 - 34.0 pg   MCHC 31.1 30.0 - 36.0 g/dL   RDW 40.9 81.1 - 91.4 %   Platelets 115 (L) 150 - 400 K/uL   nRBC 0.0 0.0 - 0.2 %    Comment: Performed at [facility], [address] [place], [place]  [address]  Comprehensive metabolic panel     Status: Abnormal   Collection Time: 09/27/23  4:13 AM  Result Value Ref Range   Sodium 136 135 - 145 mmol/L   Potassium 3.2 (L) 3.5 - 5.1 mmol/L    Comment: DELTA CHECK NOTED   Chloride 97 (L) 98 - 111 mmol/L   CO2 27 22 - 32 mmol/L   Glucose, Bld 110 (H) 70 - 99 mg/dL    Comment: Glucose reference range applies only to samples taken after fasting for at least 8 hours.   BUN 10 8 - 23 mg/dL   Creatinine, Ser 1.61 0.44 - 1.00 mg/dL   Calcium 8.3 (L) 8.9 - 10.3 mg/dL   Total Protein 5.9 (L) 6.5 - 8.1 g/dL   Albumin 2.6 (L) 3.5 - 5.0 g/dL   AST 28 15 - 41 U/L   ALT 25 0 - 44 U/L   Alkaline Phosphatase 65 38 - 126 U/L   Total Bilirubin 0.9 0.3 - 1.2 mg/dL   GFR, Estimated >09 >60 mL/min    Comment: (NOTE) Calculated using the CKD-EPI Creatinine Equation (2021)    Anion gap 12 5 - 15    Comment: Performed at [facility], [address], [place], [place] [address]  Glucose, capillary     Status: Abnormal   Collection Time: 09/27/23  7:45 AM  Result Value Ref Range   Glucose-Capillary 118 (H) 70 - 99 mg/dL    Comment: Glucose reference range applies only to samples taken after fasting for at least 8 hours.  Glucose, capillary     Status: Abnormal   Collection Time: 09/27/23 11:36 AM  Result Value Ref Range   Glucose-Capillary 140 (H) 70 - 99 mg/dL    Comment: Glucose reference range applies only to samples taken after fasting for at least 8 hours.  Magnesium     Status: None   Collection Time: 09/27/23  2:43 PM  Result Value Ref Range   Magnesium 2.3 1.7 - 2.4 mg/dL    Comment: Performed at [facility], [address] [place], [place] [address]  Glucose, capillary     Status: Abnormal   Collection Time: 09/27/23  4:39 PM  Result Value Ref Range   Glucose-Capillary 110 (H) 70 - 99 mg/dL    Comment: Glucose reference range applies only to samples taken after fasting for at least 8 hours.  Glucose,  capillary     Status: Abnormal   Collection Time: 09/27/23  9:25 PM  Result Value Ref Range   Glucose-Capillary 133 (H) 70 - 99 mg/dL    Comment: Glucose reference range applies only to samples taken after fasting for at least 8 hours.  Glucose, capillary     Status: None   Collection Time: 09/28/23  7:33 AM  Result Value Ref Range   Glucose-Capillary 91 70 - 99 mg/dL    Comment: Glucose reference range applies only to samples taken after fasting for at least 8 hours.   *Note: Due to a large number of results and/or encounters for the requested time period, some results have not been displayed. A complete set of results can be found in Results Review.   No results found.    Blood pressure 132/75, pulse 78, temperature (!) 97.4 F (36.3 C), temperature source Oral, resp. rate 16, height 5' (1.524 m), weight 71.4 kg, SpO2 99%.  Medical Problem List and Plan: 1. Functional deficits secondary to R hip fracture s/p IM nail fixation- WBAT  -patient  may  shower if ocver incisions  -ELOS/Goals: 1-0-12 days mod I to supervision Admit to CIR 2.  Antithrombotics: -DVT/anticoagulation:  Pharmaceutical: Lovenox added--will check dopplers to rule out DVT in setting of Fx/immobility  -antiplatelet therapy:  Plavix 3. Pain Management: Will schedule tylenol. Use ice prn.oxycodone prn.  4. Mood/Behavior/Sleep:   -antipsychotic agents: N/A 5. Neuropsych/cognition: This patient is usually capable of making decisions on her own behalf. 6. Skin/Wound Care: Routine pressure relief measures.  7. Fluids/Electrolytes/Nutrition: Monitor I/O. Check CMET in am 8. Confusion/dizziness: On scoplamine patch likely post op-->discontinued  --will check orthostatic vitals. Limit narcotics.  9. OSA/Nocturnal hypoxia: Continue Oxygen at nights.  --Pulmonary hygiene during the day 10. CAD s/p stent: Treated medically with DAPT and  Crestor.  11. Thrombocytopenia: Monitor for signs of bleeding.  12. ABLA: Improved  post transfusion. Continue to monitor.   --has hx of iron deficiency in the past. Will add iron supplement.  13. Recurrent hypokalemia: Will continue supplement for a few days. Mg WNL 14. HTN/Hx of atrial tachycardia: Continue Cardizem and metoprolol.   15. Anxiety/depression: On Effexor XR with xanax at bedtime.     Jacquelynn Cree, PA-C 09/28/2023  I have personally performed a face to face diagnostic evaluation of this patient and formulated the key components of the plan.  Additionally, I have personally reviewed laboratory data, imaging studies, as well as relevant notes and concur with the physician assistant's documentation above.   The patient's status has not changed from the original H&P.  Any changes in documentation from the acute care chart have been noted above.

## 2023-09-28 NOTE — TOC Transition Note (Signed)
Transition of Care [facility]) - CM/SW Discharge Note  Patient Details  Name: Carrie Chung MRN: [id number] Date of Birth: 1941-05-03  [facility]) CM/SW Contact:  Ewing Schlein, LCSW Phone Number: 09/28/2023, 10:24 AM  Clinical Narrative: CIR has a bed today and can admit the patient. Daughter notified of acceptance and that [facility] will be set up for transportation. CSW notified [facility] IP rehab [place]), and [facility]) that an IP bed would not be needed at this time. TOC signing off.  Final next level of care: IP Rehab Facility Barriers to Discharge: Barriers Resolved  Patient Goals and CMS Choice CMS Medicare.gov Compare Post Acute Care list provided to:: Patient Represenative (must comment) Choice offered to / list presented to : Patient, Adult Children  Discharge Placement       Patient chooses bed at: Other - please specify in the comment section below: ([facility]) Patient to be transferred to facility by: [facility] Name of family member notified: Camila Deboard (daughter) Patient and family notified of of transfer: 09/28/23  Discharge Plan and Services Additional resources added to the After Visit Summary for   In-house Referral: Clinical Social Work       DME Arranged: N/A DME Agency: NA  Social Determinants of Health (SDOH) Interventions SDOH Screenings   Food Insecurity: No Food Insecurity (09/20/2023)  Housing: Low Risk  (09/20/2023)  Transportation Needs: No Transportation Needs (09/20/2023)  Utilities: Not At Risk (09/20/2023)  Alcohol Screen: Low Risk  (09/03/2021)  Depression (PHQ2-9): Low Risk  (03/05/2023)  Financial Resource Strain: Low Risk  (09/03/2021)  Physical Activity: Inactive (09/03/2021)  Social Connections: Socially Integrated (09/03/2021)  Stress: No Stress Concern Present (09/03/2021)  Tobacco Use: Medium Risk (09/20/2023)  Health Literacy: Adequate Health Literacy (09/06/2023)   Readmission Risk  Interventions     No data to display

## 2023-09-28 NOTE — Care Management Important Message (Signed)
Important Message  Patient Details IM Letter given. Name: Carrie Chung MRN: [id number] Date of Birth: 18-Apr-1941   Important Message Given:  Yes - Medicare IM     Caren Macadam 09/28/2023, 10:50 AM

## 2023-09-28 NOTE — H&P (Signed)
Physical Medicine and Rehabilitation Admission H&P        Chief Complaint  Patient presents with   Functional deficits due to hip fracture.       HPI:  Carrie Chung is an 82 year old female with history of CAD, A trial tachycardia, HTN, OSA-intolerant of CPAP- on oxygen at nights for hypoxia, who was admitted on 09/20/23 after a mechanical fall with onset of right hip pain and inability to walk. She was found to have right IT femur  fracture and underwent IM nail 10/01 by Dr. Eulah Pont. She was noted to have significant hypokalemia at admission with K-2.6 which was supplemented. ABLA treated with one unit PRBC. Therapy has been limited due to pain and fatigue. She has had decline in functional status and CIR recommended due to functional decline.       Pt reports pain 3-4/10 at rest, but when transferred into bed, was 9/10 for short period- even after 2 Oxycodone before she came.    Did reports doesn't like hospital food, but thinks CBGs are "doing well" so should be able ot "eat outside food".    Pt reports feeling better s/p 2 units pRBCs.  LBM overnight- denies constipation.      Review of Systems  Constitutional:  Positive for malaise/fatigue. Negative for chills, fever and weight loss.  HENT: Negative.    Eyes: Negative.   Respiratory:  Negative for cough and shortness of breath.   Cardiovascular:  Negative for chest pain, orthopnea and claudication.  Gastrointestinal:  Negative for abdominal pain, constipation, nausea and vomiting.  Genitourinary:  Positive for frequency. Negative for dysuria and urgency.  Musculoskeletal:  Positive for joint pain and myalgias.  Skin: Negative.   Neurological:  Positive for focal weakness. Negative for sensory change.  Endo/Heme/Allergies: Negative.   Psychiatric/Behavioral: Negative.    All other systems reviewed and are negative.           Past Medical History:  Diagnosis Date   Atrial tachycardia (HCC)      a. noted at  cardiac rehab 5/13; event monitor ordered to assess for AFib   CAD (coronary artery disease)      a. NSTEMI 03/2012 ([facility]HC 03/27/12: pLAD 30%, mLAD 99%, mCFX 95%, mRCA 99% with thrombus, EF 65%);  s/p PTCA/DESx1 to prox-mid RCA 03/27/12 urgently in setting of hypotension/bradycardia, with staged PTCA/Evolve study stent to mid LAD & PTCA/Evolve study stent to prox LCx 03/29/12 ;   echo 03/28/12:EF 60%, Aortic sclerosis without AS, mild RVE, mild reduced RVSF     Endometrial polyp     HTN (hypertension)     Hyperlipidemia     MI, old     Sleep related hypoxia                 Past Surgical History:  Procedure Laterality Date   APPENDECTOMY   82 years old   CAROTID STENT   04/2012    x3   CESAREAN SECTION   1984   CHOLECYSTECTOMY   1978   FEMUR IM NAIL Right 09/21/2023    Procedure: INTRAMEDULLARY (IM) NAIL FEMORAL;  Surgeon: Sheral Apley, MD;  Location: [facility];  Service: Orthopedics;  Laterality: Right;   INTRAMEDULLARY (IM) NAIL INTERTROCHANTERIC Left 06/20/2021    Procedure: INTRAMEDULLARY (IM) NAIL INTERTROCHANTRIC;  Surgeon: Sheral Apley, MD;  Location: [facility] OR;  Service: Orthopedics;  Laterality: Left;   LEFT HEART CATHETERIZATION WITH CORONARY ANGIOGRAM N/A 03/27/2012    Procedure:  LEFT HEART CATHETERIZATION WITH CORONARY ANGIOGRAM;  Surgeon: Kathleene Hazel, MD;  Location: [facility] CATH LAB;  Service: Cardiovascular;  Laterality: N/A;   PERCUTANEOUS CORONARY STENT INTERVENTION (PCI-S) N/A 03/27/2012    Procedure: PERCUTANEOUS CORONARY STENT INTERVENTION (PCI-S);  Surgeon: Kathleene Hazel, MD;  Location: [facility] CATH LAB;  Service: Cardiovascular;  Laterality: N/A;   PERCUTANEOUS CORONARY STENT INTERVENTION (PCI-S) N/A 03/29/2012    Procedure: PERCUTANEOUS CORONARY STENT INTERVENTION (PCI-S);  Surgeon: Tonny Bollman, MD;  Location: [facility] CATH LAB;  Service: Cardiovascular;  Laterality: N/A;   TONSILLECTOMY   82 years old               Family History  Problem Relation Age of Onset    Coronary artery disease Mother          CABG at age 38, PPM   Colon cancer Father     Cancer - Other Father          larnyx          Social History: Married. Retired Engineer, civil (consulting). Husband has dementia and aide 9am -5 pm who can assist after d/c. He  reports that she quit smoking about 43 years ago. Her smoking use included cigarettes. She has never used smokeless tobacco. She reports current alcohol use of about 20.0 standard drinks of alcohol per week. She reports that she does not use drugs.     Allergies      Allergies  Allergen Reactions   Penicillins Hives, Swelling and Other (See Comments)   Sulfonamide Derivatives Hives and Swelling   Lactose Intolerance (Gi) Diarrhea   Sulfamethoxazole Swelling              Medications Prior to Admission  Medication Sig Dispense Refill   acetaminophen (TYLENOL) 325 MG tablet Take 1-2 tablets (325-650 mg total) by mouth every 4 (four) hours as needed for mild pain.       alprazolam (XANAX) 2 MG tablet TAKE 1 TABLET(2 MG) BY MOUTH AT BEDTIME AS NEEDED FOR SLEEP 30 tablet 2   aspirin 81 MG tablet Take 1 tablet (81 mg total) by mouth daily.       Calcium Carbonate-Vitamin D 600-400 MG-UNIT tablet Take 1 tablet by mouth every evening. 30 tablet 0   clopidogrel (PLAVIX) 75 MG tablet TAKE 1 TABLET(75 MG) BY MOUTH DAILY 90 tablet 3   desvenlafaxine (PRISTIQ) 50 MG 24 hr tablet TAKE 1 TABLET(50 MG) BY MOUTH DAILY 90 tablet 3   diltiazem (CARDIZEM CD) 240 MG 24 hr capsule Take 1 capsule (240 mg total) by mouth daily. 90 capsule 3   esomeprazole (NEXIUM) 40 MG capsule Take 1 capsule (40 mg total) by mouth daily at 12 noon. 30 capsule 0   hydrochlorothiazide (HYDRODIURIL) 25 MG tablet Take 1 tablet (25 mg total) by mouth daily. 90 tablet 3   metoprolol tartrate (LOPRESSOR) 100 MG tablet Take 1 tablet (100 mg total) by mouth 2 (two) times daily. 180 tablet 3   nitroGLYCERIN (NITROSTAT) 0.4 MG SL tablet Place 1 tablet (0.4 mg total) under the tongue every 5  (five) minutes as needed for chest pain. 25 tablet 10   OXYGEN Inhale into the lungs. Per patient using 2 liters at night.       prochlorperazine (COMPAZINE) 10 MG tablet Take 1 tablet (10 mg total) by mouth every 8 (eight) hours as needed for nausea or vomiting. 30 tablet 0   rosuvastatin (CRESTOR) 20 MG tablet TAKE 1 TABLET(20 MG) BY MOUTH DAILY 90 tablet  3   clotrimazole-betamethasone (LOTRISONE) cream Apply 1 Application topically daily. 45 g PRN   metFORMIN (GLUCOPHAGE) 500 MG tablet One tab by mouth daily with a meal (Patient not taking: Reported on 09/20/2023) 90 tablet 1              Home: Home Living Family/patient expects to be discharged to:: Inpatient rehab Living Arrangements: Spouse/significant other Available Help at Discharge: Personal care attendant, Other (Comment) (Aide assistance 5X/week 9A-5P. They assist with her husband (dementia) although they are able to assist her as well.) Type of Home: House Home Access: Stairs to enter Entrance Stairs-Number of Steps: 3 in front, 4 in garage, now has ramp in the front too but usually uses garage entrance. Ramp entrance has 1 step per daughter Entrance Stairs-Rails: None Home Layout: Two level, Bed/bath upstairs, Other (Comment), Able to live on main level with bedroom/bathroom Alternate Level Stairs-Number of Steps: 14 Alternate Level Stairs-Rails: Right Bathroom Shower/Tub: Engineer, manufacturing systems: Standard Bathroom Accessibility: Yes Home Equipment: Agricultural consultant (2 wheels), Hand held shower head, Cane - single point, Crutches, Tub bench, BSC/3in1, Other (comment) (leg lifter, home oxygen) Additional Comments: downstairs does not have walk in shower, has to scoot into tub using shower bench downstairs     Functional History: Prior Function Prior Level of Function : Needs assist Mobility Comments: was doing everything with just set up assist from home aides prior to this new fracture/surgery ADLs Comments: Pt  reports that she was able to complete her bathing and dressing with just set-up from aide. Supervision provided for shower transfers. Pt does not wear socks. Wear slip on shoes without heel back. Increased time needed to complete LB dressing while seated EOB.   Functional Status:  Mobility: Bed Mobility Overal bed mobility: Needs Assistance Bed Mobility: Supine to Sit Supine to sit: Min assist, HOB elevated, Used rails (leg lifter) General bed mobility comments: patient was up in the reclined and returned to the same. Transfers Overall transfer level: Needs assistance Equipment used: Rolling walker (2 wheels) Transfers: Sit to/from Stand Sit to Stand: Min assist Bed to/from chair/wheelchair/BSC transfer type:: Step pivot Step pivot transfers: Contact guard assist, +2 physical assistance Transfer via Lift Equipment: Stedy General transfer comment: verbal cues for hand placement, light assist to rise and steady Ambulation/Gait Ambulation/Gait assistance: Contact guard assist Gait Distance (Feet): 24 Feet Assistive device: Rolling walker (2 wheels) Gait Pattern/deviations: Step-to pattern, Antalgic, Decreased stance time - right General Gait Details: verbal cues for sequence, RW positioning, posture, distance to tolerance; a couple short standing rest breaks due to fatigue Gait velocity: decreased   ADL: ADL Overall ADL's : Needs assistance/impaired Grooming: Brushing hair, Supervision/safety, Sitting Grooming Details (indicate cue type and reason): with encouragement to get all sides. Upper Body Bathing: Set up, Sitting Upper Body Bathing Details (indicate cue type and reason): patient declined reporting she completed this already this AM. Lower Body Bathing: Maximal assistance, Sit to/from stand Upper Body Dressing : Set up, Sitting Lower Body Dressing: Total assistance, Sit to/from stand Toilet Transfer: Minimal assistance, Rolling walker (2 wheels), BSC/3in1 Toilet Transfer  Details (indicate cue type and reason): 2 person available for safety although not needed for physical assist. Toileting- Clothing Manipulation and Hygiene: Total assistance, Sit to/from stand   Cognition: Cognition Overall Cognitive Status: Within Functional Limits for tasks assessed Orientation Level: Oriented X4 Cognition Arousal: Alert Behavior During Therapy: WFL for tasks assessed/performed Overall Cognitive Status: Within Functional Limits for tasks assessed Area of Impairment: Memory, Attention, Following  commands Current Attention Level: Sustained Memory: Decreased short-term memory Following Commands: Follows one step commands consistently General Comments: patient was noted to have some confusion and dizziness during session   Physical Exam: Blood pressure 132/75, pulse 78, temperature (!) 97.4 F (36.3 C), temperature source Oral, resp. rate 16, height 5' (1.524 m), weight 71.4 kg, SpO2 99%. Physical Exam Constitutional:      General: She is not in acute distress.    Appearance: Normal appearance. She is obese. She is not ill-appearing.     Comments: Awake, alert, meandering conversation, but Ox3; sitting up in bed; NAD  HENT:     Head: Normocephalic and atraumatic.     Right Ear: External ear normal.     Left Ear: External ear normal.     Nose: Nose normal. No congestion.     Mouth/Throat:     Mouth: Mucous membranes are dry.     Pharynx: Oropharynx is clear. No oropharyngeal exudate.  Eyes:     General:        Right eye: No discharge.        Left eye: No discharge.     Extraocular Movements: Extraocular movements intact.  Cardiovascular:     Rate and Rhythm: Normal rate and regular rhythm.     Heart sounds: Normal heart sounds. No murmur heard.    No gallop.  Pulmonary:     Effort: Pulmonary effort is normal. No respiratory distress.     Breath sounds: Normal breath sounds. No wheezing, rhonchi or rales.  Abdominal:     General: Bowel sounds are normal.  There is no distension.     Palpations: Abdomen is soft.     Tenderness: There is no abdominal tenderness.  Genitourinary:    General: Normal vulva.  Musculoskeletal:     Cervical back: Neck supple. No tenderness.     Comments: Ue's 5/5 B/L LLE 5-/5 and RLE- HF 2/5; otherwise 5-/5 distally   Skin:    General: Skin is warm and dry.     Comments: Severe ecchymoses, purple/yellow bruising from R upper lateral hip to just below R lateral knee Medial R knee swelling Incisions no active bleeding- have steristrips- dried blood around incisions  Neurological:     Mental Status: She is alert.     Comments: Intact to light touch Ox3- but can be tangential  Psychiatric:        Behavior: Behavior normal.        Lab Results Last 48 Hours        Results for orders placed or performed during the hospital encounter of 09/20/23 (from the past 48 hour(s))  Glucose, capillary     Status: Abnormal    Collection Time: 09/26/23 11:27 AM  Result Value Ref Range    Glucose-Capillary 131 (H) 70 - 99 mg/dL      Comment: Glucose reference range applies only to samples taken after fasting for at least 8 hours.  Glucose, capillary     Status: Abnormal    Collection Time: 09/26/23  4:17 PM  Result Value Ref Range    Glucose-Capillary 158 (H) 70 - 99 mg/dL      Comment: Glucose reference range applies only to samples taken after fasting for at least 8 hours.  Glucose, capillary     Status: Abnormal    Collection Time: 09/26/23  9:17 PM  Result Value Ref Range    Glucose-Capillary 122 (H) 70 - 99 mg/dL      Comment: Glucose reference  range applies only to samples taken after fasting for at least 8 hours.  CBC     Status: Abnormal    Collection Time: 09/27/23  4:13 AM  Result Value Ref Range    WBC 7.8 4.0 - 10.5 K/uL    RBC 3.38 (L) 3.87 - 5.11 MIL/uL    Hemoglobin 9.6 (L) 12.0 - 15.0 g/dL    HCT 91.4 (L) 78.2 - 46.0 %    MCV 91.4 80.0 - 100.0 fL    MCH 28.4 26.0 - 34.0 pg    MCHC 31.1 30.0 - 36.0  g/dL    RDW 95.6 21.3 - 08.6 %    Platelets 115 (L) 150 - 400 K/uL    nRBC 0.0 0.0 - 0.2 %      Comment: Performed at [facility], [address] [place], [place] [address]  Comprehensive metabolic panel     Status: Abnormal    Collection Time: 09/27/23  4:13 AM  Result Value Ref Range    Sodium 136 135 - 145 mmol/L    Potassium 3.2 (L) 3.5 - 5.1 mmol/L      Comment: DELTA CHECK NOTED    Chloride 97 (L) 98 - 111 mmol/L    CO2 27 22 - 32 mmol/L    Glucose, Bld 110 (H) 70 - 99 mg/dL      Comment: Glucose reference range applies only to samples taken after fasting for at least 8 hours.    BUN 10 8 - 23 mg/dL    Creatinine, Ser 9.62 0.44 - 1.00 mg/dL    Calcium 8.3 (L) 8.9 - 10.3 mg/dL    Total Protein 5.9 (L) 6.5 - 8.1 g/dL    Albumin 2.6 (L) 3.5 - 5.0 g/dL    AST 28 15 - 41 U/L    ALT 25 0 - 44 U/L    Alkaline Phosphatase 65 38 - 126 U/L    Total Bilirubin 0.9 0.3 - 1.2 mg/dL    GFR, Estimated >95 >28 mL/min      Comment: (NOTE) Calculated using the CKD-EPI Creatinine Equation (2021)      Anion gap 12 5 - 15      Comment: Performed at [facility], [address] [place], [place] [address]  Glucose, capillary     Status: Abnormal    Collection Time: 09/27/23  7:45 AM  Result Value Ref Range    Glucose-Capillary 118 (H) 70 - 99 mg/dL      Comment: Glucose reference range applies only to samples taken after fasting for at least 8 hours.  Glucose, capillary     Status: Abnormal    Collection Time: 09/27/23 11:36 AM  Result Value Ref Range    Glucose-Capillary 140 (H) 70 - 99 mg/dL      Comment: Glucose reference range applies only to samples taken after fasting for at least 8 hours.  Magnesium     Status: None    Collection Time: 09/27/23  2:43 PM  Result Value Ref Range    Magnesium 2.3 1.7 - 2.4 mg/dL      Comment: Performed at [facility], [address], [place], [place] [address]  Glucose, capillary     Status:  Abnormal    Collection Time: 09/27/23  4:39 PM  Result Value Ref Range    Glucose-Capillary 110 (H) 70 - 99 mg/dL      Comment: Glucose reference range applies only to samples taken after fasting for  at least 8 hours.  Glucose, capillary     Status: Abnormal    Collection Time: 09/27/23  9:25 PM  Result Value Ref Range    Glucose-Capillary 133 (H) 70 - 99 mg/dL      Comment: Glucose reference range applies only to samples taken after fasting for at least 8 hours.  Glucose, capillary     Status: None    Collection Time: 09/28/23  7:33 AM  Result Value Ref Range    Glucose-Capillary 91 70 - 99 mg/dL      Comment: Glucose reference range applies only to samples taken after fasting for at least 8 hours.    *Note: Due to a large number of results and/or encounters for the requested time period, some results have not been displayed. A complete set of results can be found in Results Review.      Imaging Results (Last 48 hours)  No results found.         Blood pressure 132/75, pulse 78, temperature (!) 97.4 F (36.3 C), temperature source Oral, resp. rate 16, height 5' (1.524 m), weight 71.4 kg, SpO2 99%.   Medical Problem List and Plan: 1. Functional deficits secondary to R hip fracture s/p IM nail fixation- WBAT             -patient may  shower if ocver incisions             -ELOS/Goals: 1-0-12 days mod I to supervision Admit to CIR 2.  Antithrombotics: -DVT/anticoagulation:  Pharmaceutical: Lovenox added--will check dopplers to rule out DVT in setting of Fx/immobility             -antiplatelet therapy:  Plavix 3. Pain Management: Will schedule tylenol. Use ice prn.oxycodone prn.  4. Mood/Behavior/Sleep:              -antipsychotic agents: N/A 5. Neuropsych/cognition: This patient is usually capable of making decisions on her own behalf. 6. Skin/Wound Care: Routine pressure relief measures.  7. Fluids/Electrolytes/Nutrition: Monitor I/O. Check CMET in am 8. Confusion/dizziness: On  scoplamine patch likely post op-->discontinued             --will check orthostatic vitals. Limit narcotics.  9. OSA/Nocturnal hypoxia: Continue Oxygen at nights.  --Pulmonary hygiene during the day 10. CAD s/p stent: Treated medically with DAPT and  Crestor.  11. Thrombocytopenia: Monitor for signs of bleeding.  12. ABLA: Improved post transfusion. Continue to monitor.              --has hx of iron deficiency in the past. Will add iron supplement.  13. Recurrent hypokalemia: Will continue supplement for a few days. Mg WNL 14. HTN/Hx of atrial tachycardia: Continue Cardizem and metoprolol.   15. Anxiety/depression: On Effexor XR with xanax at bedtime.        Jacquelynn Cree, PA-C 09/28/2023   I have personally performed a face to face diagnostic evaluation of this patient and formulated the key components of the plan.  Additionally, I have personally reviewed laboratory data, imaging studies, as well as relevant notes and concur with the physician assistant's documentation above.   The patient's status has not changed from the original H&P.  Any changes in documentation from the acute care chart have been noted above.

## 2023-09-28 NOTE — Discharge Summary (Signed)
Physician Discharge Summary  Carrie Chung ZOX:[id number] DOB: 06/13/1941 DOA: 09/20/2023  PCP: Margaree Mackintosh, MD  Admit date: 09/20/2023 Discharge date: 09/28/2023  Admitted From: Home Disposition: [facility]  Recommendations for Outpatient Follow-up:  Follow up with PCP in 1-2 weeks Please obtain BMP/CBC in one week Please follow up with Ortho  Home Health: None Equipment/Devices: Oxygen 2 L at night  Discharge Condition: Stable  CODE STATUS: Full Diet recommendation: Cardiac  Brief/Interim Summary:   Carrie Chung is a 82 y.o. female with medical history significant of CAD, NSTEMI s/p stents, HTN, HLD.  Pt in to ED with R hip pain after mechanical fall at home.  Pain severe, unable to bear wt.  Also has R shoulder pain.  Denies head trauma, denies LOC.  Fall was due to tripping up over stairs. status post right hip nail fixation on 09/21/2023 by Dr. Eulah Pont.  Discharge Diagnoses:  Principal Problem:   Closed right hip fracture, initial encounter [facility]) Active Problems:   HYPERTENSION, BENIGN   Coronary Artery Disease   Hypoxemic respiratory failure, chronic (HCC)   Hypokalemia  Closed right hip fracture- she had right hip nail fixation on 09/21/2023 by Dr. Eulah Pont.  She was seen by physical therapy recommended CIR.  She was discharged to CIR.  ABLA-Ortho notes indicate patient lost a lot of blood during surgery.  She received 2 unit of packed RBC during this hospital stay with improvement in her hemoglobin to 9.1.   Continue Plavix.   Hypokalemia repleted   Hypoxemic respiratory failure, chronic (HCC) On 2L O2 at bedtime at baseline due to sleep related hypoxia (see pulm notes from Feb)Ordered O2 at bedtime.Maintain O2 saturation above 92%. Chest x-ray on admission with vague early process of the left base consistent with edema volume loss of scarring.   Repeat chest x-ray is clear.     Coronary Artery Disease H/o NSTEMI and multiple stents back in  2013.Cont metoprolol, cardizem, crestor.   HYPERTENSION-continue Cardizem and metoprolol.  Continue to hold HCTZ.  Nutrition Problem: Increased nutrient needs Etiology: hip fracture    Signs/Symptoms: estimated needs     Interventions: Refer to RD note for recommendations, MVI  Estimated body mass index is 30.74 kg/m as calculated from the following:   Height as of this encounter: 5' (1.524 m).   Weight as of this encounter: 71.4 kg.  Discharge Instructions  Discharge Instructions     Diet - low sodium heart healthy   Complete by: As directed    Increase activity slowly   Complete by: As directed       Allergies as of 09/28/2023       Reactions   Penicillins Hives, Swelling, Other (See Comments)   Sulfonamide Derivatives Hives, Swelling   Lactose Intolerance (gi) Diarrhea   Sulfamethoxazole Swelling        Medication List     STOP taking these medications    hydrochlorothiazide 25 MG tablet Commonly known as: HYDRODIURIL       TAKE these medications    acetaminophen 325 MG tablet Commonly known as: TYLENOL Take 1-2 tablets (325-650 mg total) by mouth every 4 (four) hours as needed for mild pain.   alprazolam 2 MG tablet Commonly known as: XANAX TAKE 1 TABLET(2 MG) BY MOUTH AT BEDTIME AS NEEDED FOR SLEEP   alum & mag hydroxide-simeth 200-200-20 MG/5ML suspension Commonly known as: MAALOX/MYLANTA Take 30 mLs by mouth every 4 (four) hours as needed for indigestion.   aspirin 81 MG  tablet Take 1 tablet (81 mg total) by mouth daily.   bisacodyl 10 MG suppository Commonly known as: DULCOLAX Place 1 suppository (10 mg total) rectally daily as needed for moderate constipation.   Calcium Carbonate-Vitamin D 600-400 MG-UNIT tablet Take 1 tablet by mouth every evening.   clopidogrel 75 MG tablet Commonly known as: PLAVIX TAKE 1 TABLET(75 MG) BY MOUTH DAILY   clotrimazole-betamethasone cream Commonly known as: LOTRISONE Apply 1 Application  topically daily.   desvenlafaxine 50 MG 24 hr tablet Commonly known as: PRISTIQ TAKE 1 TABLET(50 MG) BY MOUTH DAILY   diltiazem 240 MG 24 hr capsule Commonly known as: CARDIZEM CD Take 1 capsule (240 mg total) by mouth daily.   docusate sodium 100 MG capsule Commonly known as: COLACE Take 1 capsule (100 mg total) by mouth 2 (two) times daily.   esomeprazole 40 MG capsule Commonly known as: NEXIUM Take 1 capsule (40 mg total) by mouth daily at 12 noon.   feeding supplement (KATE FARMS STANDARD 1.4) Liqd liquid Take 325 mLs by mouth 2 (two) times daily between meals.   metFORMIN 500 MG tablet Commonly known as: GLUCOPHAGE One tab by mouth daily with a meal   methocarbamol 500 MG tablet Commonly known as: ROBAXIN Take 1 tablet (500 mg total) by mouth every 6 (six) hours as needed for muscle spasms.   metoprolol tartrate 100 MG tablet Commonly known as: LOPRESSOR Take 1 tablet (100 mg total) by mouth 2 (two) times daily.   multivitamin with minerals Tabs tablet Take 1 tablet by mouth daily. Start taking on: September 29, 2023   nitroGLYCERIN 0.4 MG SL tablet Commonly known as: NITROSTAT Place 1 tablet (0.4 mg total) under the tongue every 5 (five) minutes as needed for chest pain.   OXYGEN Inhale into the lungs. Per patient using 2 liters at night.   polyethylene glycol 17 g packet Commonly known as: MIRALAX / GLYCOLAX Take 17 g by mouth daily as needed for mild constipation.   prochlorperazine 10 MG tablet Commonly known as: COMPAZINE Take 1 tablet (10 mg total) by mouth every 8 (eight) hours as needed for nausea or vomiting.   rosuvastatin 20 MG tablet Commonly known as: CRESTOR TAKE 1 TABLET(20 MG) BY MOUTH DAILY        Follow-up Information     Sheral Apley, MD. Schedule an appointment as soon as possible for a visit in 2 week(s).   Specialty: Orthopedic Surgery Contact information: [address] [place]  [address] [phone]                Allergies  Allergen Reactions   Penicillins Hives, Swelling and Other (See Comments)   Sulfonamide Derivatives Hives and Swelling   Lactose Intolerance (Gi) Diarrhea   Sulfamethoxazole Swelling    Consultations: Ortho   Procedures/Studies: DG Chest 1 View  Result Date: 09/24/2023 CLINICAL DATA:  Hypoxia. EXAM: CHEST  1 VIEW COMPARISON:  September 20, 2023. FINDINGS: The heart size and mediastinal contours are within normal limits. Both lungs are clear. The visualized skeletal structures are unremarkable. IMPRESSION: No active disease. Electronically Signed   By: Lupita Raider M.D.   On: 09/24/2023 13:35   DG HIP UNILAT WITH PELVIS 2-3 VIEWS RIGHT  Result Date: 09/21/2023 CLINICAL DATA:  Elective surgery. EXAM: DG HIP (WITH OR WITHOUT PELVIS) 2-3V RIGHT COMPARISON:  Preoperative imaging. FINDINGS: Nine fluoroscopic spot views of the right hip obtained in the operating room. Femoral intramedullary nail with trans trochanteric  and distal locking screw fixation traverse intertrochanteric femur fracture. Fluoroscopy time 35 seconds. Dose 4.87 mGy. IMPRESSION: Intraoperative fluoroscopy during right femoral intramedullary nail fixation of proximal femur fracture. Electronically Signed   By: Narda Rutherford M.D.   On: 09/21/2023 17:13   DG C-Arm 1-60 Min-No Report  Result Date: 09/21/2023 Fluoroscopy was utilized by the requesting physician.  No radiographic interpretation.   DG Shoulder Right Port  Result Date: 09/20/2023 CLINICAL DATA:  Fall pain EXAM: RIGHT SHOULDER - 2 VIEW COMPARISON:  None Available. FINDINGS: There is no evidence of fracture or dislocation. Acromioclavicular degenerative change with narrowing and osteophyte formation. IMPRESSION: Acromioclavicular degenerative changes. No acute osseous abnormalities. Electronically Signed   By: Layla Maw M.D.   On: 09/20/2023 19:38   DG Hip Unilat With Pelvis 2-3 Views  Right  Result Date: 09/20/2023 CLINICAL DATA:  Fall pain EXAM: DG HIP (WITH OR WITHOUT PELVIS) 3V RIGHT COMPARISON:  None Available. FINDINGS: Comminuted intertrochanteric fracture proximal right femur. Lesser trochanter fragment displaced by up to 1.5 cm. Bilateral hip osteoarthritis with joint space narrowing and osteophytes. Prior left hip ORIF. No osteolytic or osteoblastic changes. Pelvic ring intact. IMPRESSION: Comminuted displaced right proximal femoral intertrochanteric fracture. Electronically Signed   By: Layla Maw M.D.   On: 09/20/2023 19:38   DG Chest Port 1 View  Result Date: 09/20/2023 CLINICAL DATA:  Fall. EXAM: PORTABLE CHEST 1 VIEW COMPARISON:  07/10/2021. FINDINGS: Unremarkable cardiac silhouette. Calcified aorta. No pneumothorax or pleural effusion. Osseous structures appear grossly intact and are not well evaluated with chest x-ray. Unremarkable pulmonary vasculature. Vague left basilar opacity could represent an early infectious process versus chronic scarring. IMPRESSION: Vague/early process at the left base consistent with edema, volume loss or scarring. Otherwise no acute cardiopulmonary process. Electronically Signed   By: Layla Maw M.D.   On: 09/20/2023 19:36   (Echo, Carotid, EGD, Colonoscopy, ERCP)    Subjective:  Anxious to go to rehab Discharge Exam: Vitals:   09/27/23 2129 09/28/23 0542  BP: (!) 146/62 132/75  Pulse: 86 78  Resp: 16 16  Temp: 99 F (37.2 C) (!) 97.4 F (36.3 C)  SpO2: 100% 99%   Vitals:   09/27/23 0856 09/27/23 1354 09/27/23 2129 09/28/23 0542  BP:  (!) 115/52 (!) 146/62 132/75  Pulse: 75 70 86 78  Resp:  18 16 16   Temp:  99.2 F (37.3 C) 99 F (37.2 C) (!) 97.4 F (36.3 C)  TempSrc:  Oral Oral Oral  SpO2:  98% 100% 99%  Weight:      Height:        General: Pt is alert, awake, not in acute distress Cardiovascular: RRR, S1/S2 +, no rubs, no gallops Respiratory: CTA bilaterally, no wheezing, no rhonchi Abdominal:  Soft, NT, ND, bowel sounds + Extremities: no edema, no cyanosis right hip incision clean dry intact    The results of significant diagnostics from this hospitalization (including imaging, microbiology, ancillary and laboratory) are listed below for reference.     Microbiology: No results found for this or any previous visit (from the past 240 hour(s)).   Labs: BNP (last 3 results) No results for input(s): "BNP" in the last 8760 hours. Basic Metabolic Panel: Recent Labs  Lab 09/22/23 0433 09/23/23 0427 09/24/23 0903 09/25/23 0459 09/27/23 0413 09/27/23 1443  NA 131* 138 135 133* 136  --   K 3.8 3.9 3.6 4.9 3.2*  --   CL 90* 93* 96* 93* 97*  --   CO2 30  33* 30 30 27   --   GLUCOSE 301* 190* 108* 130* 110*  --   BUN 14 15 13 16 10   --   CREATININE 0.88 0.73 0.71 0.79 0.64  --   CALCIUM 7.9* 8.6* 8.2* 8.3* 8.3*  --   MG  --   --   --   --   --  2.3   Liver Function Tests: Recent Labs  Lab 09/23/23 0427 09/24/23 0903 09/27/23 0413  AST 40 28 28  ALT 35 26 25  ALKPHOS 79 69 65  BILITOT 0.3 0.5 0.9  PROT 6.0* 5.8* 5.9*  ALBUMIN 2.9* 2.9* 2.6*   No results for input(s): "LIPASE", "AMYLASE" in the last 168 hours. No results for input(s): "AMMONIA" in the last 168 hours. CBC: Recent Labs  Lab 09/23/23 0427 09/24/23 0903 09/25/23 0459 09/26/23 0728 09/27/23 0413  WBC 10.1 6.9 6.2 8.6 7.8  HGB 7.5* 8.4* 7.7* 9.1* 9.6*  HCT 23.3* 27.5* 25.3* 29.0* 30.9*  MCV 91.7 91.7 92.3 91.2 91.4  PLT 177 135* 111* 113* 115*   Cardiac Enzymes: No results for input(s): "CKTOTAL", "CKMB", "CKMBINDEX", "TROPONINI" in the last 168 hours. BNP: Invalid input(s): "POCBNP" CBG: Recent Labs  Lab 09/27/23 1639 09/27/23 2125 09/28/23 0733 09/28/23 1157 09/28/23 1719  GLUCAP 110* 133* 91 107* 101*   D-Dimer No results for input(s): "DDIMER" in the last 72 hours. Hgb A1c No results for input(s): "HGBA1C" in the last 72 hours. Lipid Profile No results for input(s): "CHOL",  "HDL", "LDLCALC", "TRIG", "CHOLHDL", "LDLDIRECT" in the last 72 hours. Thyroid function studies No results for input(s): "TSH", "T4TOTAL", "T3FREE", "THYROIDAB" in the last 72 hours.  Invalid input(s): "FREET3" Anemia work up No results for input(s): "VITAMINB12", "FOLATE", "FERRITIN", "TIBC", "IRON", "RETICCTPCT" in the last 72 hours. Urinalysis    Component Value Date/Time   COLORURINE STRAW (A) 07/09/2021 2131   APPEARANCEUR CLEAR 07/09/2021 2131   LABSPEC 1.004 (L) 07/09/2021 2131   PHURINE 7.0 07/09/2021 2131   GLUCOSEU NEGATIVE 07/09/2021 2131   HGBUR NEGATIVE 07/09/2021 2131   BILIRUBINUR neg 09/04/2022 1202   KETONESUR NEGATIVE 07/09/2021 2131   PROTEINUR Negative 09/04/2022 1202   PROTEINUR NEGATIVE 07/09/2021 2131   UROBILINOGEN 0.2 09/04/2022 1202   UROBILINOGEN 0.2 05/10/2009 1142   NITRITE neg 09/04/2022 1202   NITRITE NEGATIVE 07/09/2021 2131   LEUKOCYTESUR Negative 09/04/2022 1202   LEUKOCYTESUR NEGATIVE 07/09/2021 2131   Sepsis Labs Recent Labs  Lab 09/24/23 0903 09/25/23 0459 09/26/23 0728 09/27/23 0413  WBC 6.9 6.2 8.6 7.8   Microbiology No results found for this or any previous visit (from the past 240 hour(s)).   Time coordinating discharge: 30 minutes  SIGNED: Alwyn Ren, MD  [facility] 09/28/2023, 5:50 PM

## 2023-09-28 NOTE — Progress Notes (Signed)
Inpatient Rehab Admissions Coordinator    I have a CIR bed for this Pt. Today. RN may call report to [phone].  Pt to admit to CIR for intensive rehab for an estimated 7-10 days with the goal of reaching supervision level and returning home with support of family and paid caregivers.

## 2023-09-29 ENCOUNTER — Inpatient Hospital Stay (HOSPITAL_COMMUNITY): Payer: Medicare Other

## 2023-09-29 DIAGNOSIS — M623 Immobility syndrome (paraplegic): Secondary | ICD-10-CM | POA: Diagnosis not present

## 2023-09-29 DIAGNOSIS — S72001S Fracture of unspecified part of neck of right femur, sequela: Secondary | ICD-10-CM | POA: Diagnosis not present

## 2023-09-29 LAB — COMPREHENSIVE METABOLIC PANEL
ALT: 21 U/L (ref 0–44)
AST: 19 U/L (ref 15–41)
Albumin: 2.3 g/dL — ABNORMAL LOW (ref 3.5–5.0)
Alkaline Phosphatase: 62 U/L (ref 38–126)
Anion gap: 9 (ref 5–15)
BUN: 11 mg/dL (ref 8–23)
CO2: 30 mmol/L (ref 22–32)
Calcium: 8.5 mg/dL — ABNORMAL LOW (ref 8.9–10.3)
Chloride: 100 mmol/L (ref 98–111)
Creatinine, Ser: 0.7 mg/dL (ref 0.44–1.00)
GFR, Estimated: >60 mL/min (ref >60)
Glucose, Bld: 102 mg/dL — ABNORMAL HIGH (ref 70–99)
Potassium: 3.9 mmol/L (ref 3.5–5.1)
Sodium: 139 mmol/L (ref 135–145)
Total Bilirubin: 1.1 mg/dL (ref 0.3–1.2)
Total Protein: 5.4 g/dL — ABNORMAL LOW (ref 6.5–8.1)

## 2023-09-29 LAB — CBC WITH DIFFERENTIAL/PLATELET
Abs Immature Granulocytes: 0.13 10*3/uL — ABNORMAL HIGH (ref 0.00–0.07)
Basophils Absolute: 0 10*3/uL (ref 0.0–0.1)
Basophils Relative: 0 %
Eosinophils Absolute: 0.2 10*3/uL (ref 0.0–0.5)
Eosinophils Relative: 4 %
HCT: 29.7 % — ABNORMAL LOW (ref 36.0–46.0)
Hemoglobin: 9.5 g/dL — ABNORMAL LOW (ref 12.0–15.0)
Immature Granulocytes: 3 %
Lymphocytes Relative: 25 %
Lymphs Abs: 1.2 10*3/uL (ref 0.7–4.0)
MCH: 28.7 pg (ref 26.0–34.0)
MCHC: 32 g/dL (ref 30.0–36.0)
MCV: 89.7 fL (ref 80.0–100.0)
Monocytes Absolute: 0.6 10*3/uL (ref 0.1–1.0)
Monocytes Relative: 12 %
Neutro Abs: 2.8 10*3/uL (ref 1.7–7.7)
Neutrophils Relative %: 56 %
Platelets: 142 10*3/uL — ABNORMAL LOW (ref 150–400)
RBC: 3.31 MIL/uL — ABNORMAL LOW (ref 3.87–5.11)
RDW: 14.5 % (ref 11.5–15.5)
WBC: 4.9 10*3/uL (ref 4.0–10.5)
nRBC: 0 % (ref 0.0–0.2)

## 2023-09-29 LAB — VITAMIN D 25 HYDROXY (VIT D DEFICIENCY, FRACTURES): Vit D, 25-Hydroxy: 54.01 ng/mL (ref 30–100)

## 2023-09-29 LAB — GLUCOSE, CAPILLARY
Glucose-Capillary: 95 mg/dL (ref 70–99)
Glucose-Capillary: 118 mg/dL — ABNORMAL HIGH (ref 70–99)

## 2023-09-29 LAB — MAGNESIUM: Magnesium: 2.1 mg/dL (ref 1.7–2.4)

## 2023-09-29 MED ORDER — VITAMIN D 25 MCG (1000 UNIT) PO TABS
1000.0000 [IU] | ORAL_TABLET | Freq: Every day | ORAL | Status: DC
  Administered 2023-09-29 – 2023-10-08 (×10): 1000 [IU] via ORAL
  Filled 2023-09-29 (×10): qty 1

## 2023-09-29 MED ORDER — METOPROLOL TARTRATE 50 MG PO TABS
75.0000 mg | ORAL_TABLET | Freq: Two times a day (BID) | ORAL | Status: DC
  Administered 2023-09-29 – 2023-09-30 (×2): 75 mg via ORAL
  Filled 2023-09-29 (×2): qty 1

## 2023-09-29 MED ORDER — NYSTATIN 100000 UNIT/GM EX POWD
Freq: Two times a day (BID) | CUTANEOUS | Status: DC
  Administered 2023-10-08: 1 via TOPICAL
  Filled 2023-09-29: qty 15

## 2023-09-29 MED ORDER — POTASSIUM CHLORIDE CRYS ER 10 MEQ PO TBCR
10.0000 meq | EXTENDED_RELEASE_TABLET | Freq: Once | ORAL | Status: AC
  Administered 2023-09-29: 10 meq via ORAL
  Filled 2023-09-29: qty 1

## 2023-09-29 MED ORDER — MAGNESIUM GLUCONATE 500 MG PO TABS
250.0000 mg | ORAL_TABLET | ORAL | Status: DC
  Administered 2023-09-30 – 2023-10-08 (×8): 250 mg via ORAL
  Filled 2023-09-29 (×7): qty 1

## 2023-09-29 MED ORDER — CALCIUM CITRATE 950 (200 CA) MG PO TABS
200.0000 mg | ORAL_TABLET | Freq: Every day | ORAL | Status: DC
  Administered 2023-09-29 – 2023-10-08 (×10): 200 mg via ORAL
  Filled 2023-09-29 (×10): qty 1

## 2023-09-29 NOTE — Evaluation (Signed)
Occupational Therapy Assessment and Plan  Patient Details  Name: Carrie Chung MRN: [id number] Date of Birth: [birth year]  OT Diagnosis: abnormal posture, acute pain, muscle weakness (generalized), pain in joint, and swelling of limb Rehab Potential: Rehab Potential (ACUTE ONLY): Good ELOS: 7-10 days   Today's Date: 09/29/2023 OT Individual Time: 1019-1130 OT Individual Time Calculation (min): 71 min     Hospital Problem: Principal Problem:   Closed right hip fracture, sequela   Past Medical History:  Past Medical History:  Diagnosis Date   Atrial tachycardia (HCC)    a. noted at cardiac rehab 5/13; event monitor ordered to assess for AFib   CAD (coronary artery disease)    a. NSTEMI 03/2012 ([facility]HC 03/27/12: pLAD 30%, mLAD 99%, mCFX 95%, mRCA 99% with thrombus, EF 65%);  s/p PTCA/DESx1 to prox-mid RCA 03/27/12 urgently in setting of hypotension/bradycardia, with staged PTCA/Evolve study stent to mid LAD & PTCA/Evolve study stent to prox LCx 03/29/12 ;   echo 03/28/12:EF 60%, Aortic sclerosis without AS, mild RVE, mild reduced RVSF     Endometrial polyp    HTN (hypertension)    Hyperlipidemia    MI, old    Sleep related hypoxia    Past Surgical History:  Past Surgical History:  Procedure Laterality Date   APPENDECTOMY  82 years old   CAROTID STENT  04/2012   x3   CESAREAN SECTION  1984   CHOLECYSTECTOMY  1978   FEMUR IM NAIL Right 09/21/2023   Procedure: INTRAMEDULLARY (IM) NAIL FEMORAL;  Surgeon: Sheral Apley, MD;  Location: [facility];  Service: Orthopedics;  Laterality: Right;   INTRAMEDULLARY (IM) NAIL INTERTROCHANTERIC Left 06/20/2021   Procedure: INTRAMEDULLARY (IM) NAIL INTERTROCHANTRIC;  Surgeon: Sheral Apley, MD;  Location: [facility] OR;  Service: Orthopedics;  Laterality: Left;   LEFT HEART CATHETERIZATION WITH CORONARY ANGIOGRAM N/A 03/27/2012   Procedure: LEFT HEART CATHETERIZATION WITH CORONARY ANGIOGRAM;  Surgeon: Kathleene Hazel, MD;  Location: [facility] CATH LAB;  Service:  Cardiovascular;  Laterality: N/A;   PERCUTANEOUS CORONARY STENT INTERVENTION (PCI-S) N/A 03/27/2012   Procedure: PERCUTANEOUS CORONARY STENT INTERVENTION (PCI-S);  Surgeon: Kathleene Hazel, MD;  Location: [facility] CATH LAB;  Service: Cardiovascular;  Laterality: N/A;   PERCUTANEOUS CORONARY STENT INTERVENTION (PCI-S) N/A 03/29/2012   Procedure: PERCUTANEOUS CORONARY STENT INTERVENTION (PCI-S);  Surgeon: Tonny Bollman, MD;  Location: [facility] CATH LAB;  Service: Cardiovascular;  Laterality: N/A;   TONSILLECTOMY  82 years old    Assessment & Plan Clinical Impression:  Carrie Chung is an 82 year old female with history of CAD, A trial tachycardia, HTN, OSA-intolerant of CPAP- on oxygen at nights for hypoxia, who was admitted on 09/20/23 after a mechanical fall with onset of right hip pain and inability to walk. She was found to have right IT femur fracture and underwent IM nail 10/01 by Dr. Eulah Pont. She was noted to have significant hypokalemia at admission with K-2.6 which was supplemented. ABLA treated with one unit PRBC. Therapy has been limited due to pain and fatigue. She has had decline in functional status and CIR recommended due to functional decline. Patient transferred to CIR on 09/28/2023 .    Patient currently requires min with basic self-care skills and IADL secondary to muscle weakness, decreased cardiorespiratoy endurance, decreased coordination, and decreased standing balance.  Prior to hospitalization, patient could complete ADLs with  independently/set up A .  Patient will benefit from skilled intervention to increase independence with basic self-care skills and increase level of independence with iADL  prior to discharge home with care partner.  Anticipate patient will be mod I and no further OT follow recommended.  OT - End of Session Activity Tolerance: Tolerates 10 - 20 min activity with multiple rests Endurance Deficit: Yes Endurance Deficit Description: mild SOB with prolonged  standing OT Assessment Rehab Potential (ACUTE ONLY): Good OT Barriers to Discharge: Home environment access/layout;Wound Care OT Patient demonstrates impairments in the following area(s): Balance;Edema;Endurance;Motor;Pain;Sensory;Skin Integrity OT Basic ADL's Functional Problem(s): Bathing;Dressing;Toileting OT Advanced ADL's Functional Problem(s): Simple Meal Preparation OT Transfers Functional Problem(s): Toilet;Tub/Shower OT Additional Impairment(s): None OT Plan OT Intensity: Minimum of 1-2 x/day, 45 to 90 minutes OT Frequency: 5 out of 7 days OT Duration/Estimated Length of Stay: 7-10 days OT Treatment/Interventions: Balance/vestibular training;Discharge planning;Pain management;Self Care/advanced ADL retraining;UE/LE Coordination activities;Therapeutic Activities;Disease mangement/prevention;Functional mobility training;Patient/family education;Skin care/wound managment;Therapeutic Exercise;Community reintegration;DME/adaptive equipment instruction;Neuromuscular re-education;UE/LE Strength taining/ROM OT Self Feeding Anticipated Outcome(s): Independent OT Basic Self-Care Anticipated Outcome(s): Mod I OT Toileting Anticipated Outcome(s): Mod I OT Bathroom Transfers Anticipated Outcome(s): Mod I OT Recommendation Recommendations for Other Services: Therapeutic Recreation consult Therapeutic Recreation Interventions: Pet therapy;Stress management;Outing/community reintergration Patient destination: Home Follow Up Recommendations: None Equipment Recommended: To be determined   OT Evaluation Precautions/Restrictions  Precautions Precautions: Fall Restrictions Weight Bearing Restrictions: No Home Living/Prior Functioning Home Living Family/patient expects to be discharged to:: Private residence Living Arrangements: Spouse/significant other Available Help at Discharge: Personal care attendant, Other (Comment) (has aide who stays with her husband (who has Alzheimer's) all day and  all evening, but when pt D/Cs home, aide will probably just stay during the day.) Type of Home: House Home Access: Stairs to enter, Ramped entrance (4 steps into garage with wall on 1 side and plans to get railing installed. Pt reports having a ramp at front entrance) Entrance Stairs-Rails: None Home Layout: Two level, Bed/bath upstairs, Able to live on main level with bedroom/bathroom, Full bath on main level (spare bedroom and full bath) Alternate Level Stairs-Number of Steps: 14 Alternate Level Stairs-Rails: Right Bathroom Shower/Tub: Tub/shower unit, Walk-in shower (tub/shower with TTB on main floor, walk in shower with seat upstairs) Bathroom Toilet: Standard Bathroom Accessibility: Yes Additional Comments: Pt reports using SPC out in community but walking without AD inside the home. Pt also has a RW, shower bench, and commode  Lives With: Spouse IADL History Homemaking Responsibilities: No Current License: Yes Mode of Transportation: Car Occupation: Retired Type of Occupation: Retired Educational psychologist Level of Independence: Requires assistive device for independence, Needs assistance with homemaking, Independent with basic ADLs, Independent with transfers, Independent with gait (intermittent use of SPC for mobility, setup A for ADLs by health aide) Meal Prep: Other (comment) (health aide provided meals along with daughter) Laundry: Other (comment) (health aide)  Able to Take Stairs?: Yes Driving: Yes Vision Baseline Vision/History: 1 Wears glasses (readers and contacts) Ability to See in Adequate Light: 0 Adequate Patient Visual Report: No change from baseline Vision Assessment?: Wears glasses for reading Perception  Perception: Within Functional Limits Praxis Praxis: WFL Cognition Cognition Overall Cognitive Status: Within Functional Limits for tasks assessed Arousal/Alertness: Awake/alert Orientation Level: Person;Place;Situation Person: Oriented Place:  Oriented Situation: Oriented Memory: Appears intact Awareness: Appears intact Problem Solving: Appears intact Safety/Judgment: Appears intact Brief Interview for Mental Status (BIMS) Repetition of Three Words (First Attempt): 3 Temporal Orientation: Year: Correct Temporal Orientation: Month: Accurate within 5 days Temporal Orientation: Day: Incorrect Recall: "Sock": Yes, no cue required Recall: "Blue": Yes, no cue required Recall: "Bed": Yes, no cue required BIMS Summary Score: 14  Sensation Sensation Light Touch: Impaired Detail Light Touch Impaired Details: Impaired RLE;Impaired LLE Hot/Cold: Not tested Proprioception: Appears Intact Stereognosis: Not tested Additional Comments: pt reports numbnes along bilateral plantar aspects of feet. Decreased sensation along RLE. Coordination Gross Motor Movements are Fluid and Coordinated: No Fine Motor Movements are Fluid and Coordinated: Yes Finger Nose Finger Test: Mild tremor on LUE but WFL Heel Shin Test: WFL on LLE, unable to perform on RLE Motor  Motor Motor: Abnormal postural alignment and control Motor - Skilled Clinical Observations: altered balance strategies due to pain in R hip  Trunk/Postural Assessment  Cervical Assessment Cervical Assessment: Within Functional Limits Thoracic Assessment Thoracic Assessment: Exceptions to [facility] (thoracic rounding) Lumbar Assessment Lumbar Assessment: Within Functional Limits Postural Control Postural Control: Within Functional Limits  Balance Balance Balance Assessed: Yes Static Sitting Balance Static Sitting - Balance Support: Feet supported;Bilateral upper extremity supported Static Sitting - Level of Assistance: 6: Modified independent (Device/Increase time) Dynamic Sitting Balance Dynamic Sitting - Balance Support: Feet supported;No upper extremity supported Dynamic Sitting - Level of Assistance: 5: Stand by assistance (supervision) Static Standing Balance Static Standing -  Balance Support: Bilateral upper extremity supported;During functional activity (RW) Static Standing - Level of Assistance: 5: Stand by assistance (CGA) Dynamic Standing Balance Dynamic Standing - Balance Support: Bilateral upper extremity supported;During functional activity (RW) Dynamic Standing - Level of Assistance: 4: Min assist Dynamic Standing - Comments: transfers and gait Extremity/Trunk Assessment RUE Assessment RUE Assessment: Within Functional Limits LUE Assessment LUE Assessment: Exceptions to [facility] Active Range of Motion (AROM) Comments: WFL General Strength Comments: 4-/5 at shoulder and elbow  Care Tool Care Tool Self Care Eating   Eating Assist Level: Set up assist    Oral Care    Oral Care Assist Level: Set up assist    Bathing   Body parts bathed by patient: Right arm;Left arm;Chest;Abdomen;Front perineal area;Buttocks;Right upper leg;Left upper leg;Face Body parts bathed by helper: Right lower leg;Left lower leg   Assist Level: Minimal Assistance - Patient > 75%    Upper Body Dressing(including orthotics)   What is the patient wearing?: Pull over shirt;Bra   Assist Level: Set up assist    Lower Body Dressing (excluding footwear)   What is the patient wearing?: Underwear/pull up;Pants Assist for lower body dressing: Minimal Assistance - Patient > 75%    Putting on/Taking off footwear   What is the patient wearing?: Non-skid slipper socks Assist for footwear: Dependent - Patient 0%       Care Tool Toileting Toileting activity   Assist for toileting: Minimal Assistance - Patient > 75%     Care Tool Bed Mobility Roll left and right activity   Roll left and right assist level: Supervision/Verbal cueing    Sit to lying activity        Lying to sitting on side of bed activity   Lying to sitting on side of bed assist level: the ability to move from lying on the back to sitting on the side of the bed with no back support.: Supervision/Verbal cueing      Care Tool Transfers Sit to stand transfer   Sit to stand assist level: Minimal Assistance - Patient > 75%    Chair/bed transfer   Chair/bed transfer assist level: Minimal Assistance - Patient > 75%     Toilet transfer   Assist Level: Minimal Assistance - Patient > 75%     Care Tool Cognition  Expression of Ideas and Wants Expression of Ideas and Wants: 4.  Without difficulty (complex and basic) - expresses complex messages without difficulty and with speech that is clear and easy to understand  Understanding Verbal and Non-Verbal Content Understanding Verbal and Non-Verbal Content: 4. Understands (complex and basic) - clear comprehension without cues or repetitions   Memory/Recall Ability Memory/Recall Ability : Current season;That he or she is in a hospital/hospital unit;Staff names and faces   Refer to Care Plan for Long Term Goals  SHORT TERM GOAL WEEK 1 OT Short Term Goal 1 (Week 1): STG = LTG due to ELOS  Recommendations for other services: Therapeutic Recreation  Pet therapy, Stress management, and Outing/community reintegration   Skilled Therapeutic Intervention Patient received seated in w/c upon therapy arrival and agreeable to participate in OT evaluation. Education provided on OT purpose, therapy schedule, goals for therapy, and safety policy while in rehab.   5/10 pain reported in Rt hip; pre-medicated. OT offered rest breaks and repositioning for pain reduction. Patient demonstrates global deconditioning, along with dynamic standing balance and gross motor/LUE fine motor coordination deficits resulting in difficulty completing BADL tasks without increased physical assist. Pt will benefit from skilled OT services to focus on mentioned deficits. Bathing completed at sink side, and ambulatory transfers with use of RW, with cues during ADLs for sequencing and attention as pt with mild difficulty with dual tasking. See below for ADL and functional transfer performance. Pt  remained seated in recliner at conclusion of session with chair alarm on and all needs met at end of session.    ADL ADL Eating: Set up Where Assessed-Eating: Chair Grooming: Setup Where Assessed-Grooming: Sitting at sink Upper Body Bathing: Setup Where Assessed-Upper Body Bathing: Sitting at sink Lower Body Bathing: Minimal assistance Where Assessed-Lower Body Bathing: Sitting at sink;Standing at sink Upper Body Dressing: Setup Where Assessed-Upper Body Dressing: Sitting at sink Lower Body Dressing: Minimal assistance Where Assessed-Lower Body Dressing: Sitting at sink;Standing at sink Toileting: Minimal assistance (simulated during dressing) Where Assessed-Toileting: Teacher, adult education: Curator Method: Proofreader: Raised toilet seat;Other (comment) (RW) Tub/Shower Transfer: Unable to assess Tub/Shower Transfer Method: Unable to assess Film/video editor: Unable to assess Film/video editor Method: Unable to assess Mobility  Bed Mobility Bed Mobility: Rolling Left;Supine to Sit Rolling Left: Supervision/Verbal cueing Supine to Sit: Supervision/Verbal cueing Transfers Sit to Stand: Minimal Assistance - Patient > 75% Stand to Sit: Contact Guard/Touching assist   Discharge Criteria: Patient will be discharged from OT if patient refuses treatment 3 consecutive times without medical reason, if treatment goals not met, if there is a change in medical status, if patient makes no progress towards goals or if patient is discharged from hospital.  The above assessment, treatment plan, treatment alternatives and goals were discussed and mutually agreed upon: by patient  Melvyn Novas, MS, OTR/L  09/29/2023, 11:58 AM

## 2023-09-29 NOTE — Progress Notes (Signed)
Inpatient Rehabilitation  Patient information reviewed and entered into eRehab system by Demarrio Menges Robert Sunga, OTR/L, Rehab Quality Coordinator.   Information including medical coding, functional ability and quality indicators will be reviewed and updated through discharge.   

## 2023-09-29 NOTE — Plan of Care (Signed)
  Problem: RH Balance Goal: LTG Patient will maintain dynamic standing with ADLs (OT) Description: LTG:  Patient will maintain dynamic standing balance with assist during activities of daily living (OT)  Flowsheets (Taken 09/29/2023 1154) LTG: Pt will maintain dynamic standing balance during ADLs with: Independent with assistive device   Problem: Sit to Stand Goal: LTG:  Patient will perform sit to stand in prep for activites of daily living with assistance level (OT) Description: LTG:  Patient will perform sit to stand in prep for activites of daily living with assistance level (OT) Flowsheets (Taken 09/29/2023 1154) LTG: PT will perform sit to stand in prep for activites of daily living with assistance level: Independent with assistive device   Problem: RH Bathing Goal: LTG Patient will bathe all body parts with assist levels (OT) Description: LTG: Patient will bathe all body parts with assist levels (OT) Flowsheets (Taken 09/29/2023 1154) LTG: Pt will perform bathing with assistance level/cueing: Independent with assistive device    Problem: RH Dressing Goal: LTG Patient will perform lower body dressing w/assist (OT) Description: LTG: Patient will perform lower body dressing with assist, with/without cues in positioning using equipment (OT) Flowsheets (Taken 09/29/2023 1154) LTG: Pt will perform lower body dressing with assistance level of: Independent with assistive device   Problem: RH Toileting Goal: LTG Patient will perform toileting task (3/3 steps) with assistance level (OT) Description: LTG: Patient will perform toileting task (3/3 steps) with assistance level (OT)  Flowsheets (Taken 09/29/2023 1154) LTG: Pt will perform toileting task (3/3 steps) with assistance level: Independent with assistive device   Problem: RH Simple Meal Prep Goal: LTG Patient will perform simple meal prep w/assist (OT) Description: LTG: Patient will perform simple meal prep with assistance, with/without  cues (OT). Flowsheets (Taken 09/29/2023 1154) LTG: Pt will perform simple meal prep with assistance level of: Independent with assistive device   Problem: RH Toilet Transfers Goal: LTG Patient will perform toilet transfers w/assist (OT) Description: LTG: Patient will perform toilet transfers with assist, with/without cues using equipment (OT) Flowsheets (Taken 09/29/2023 1154) LTG: Pt will perform toilet transfers with assistance level of: Independent with assistive device   Problem: RH Tub/Shower Transfers Goal: LTG Patient will perform tub/shower transfers w/assist (OT) Description: LTG: Patient will perform tub/shower transfers with assist, with/without cues using equipment (OT) Flowsheets (Taken 09/29/2023 1154) LTG: Pt will perform tub/shower stall transfers with assistance level of: Independent with assistive device

## 2023-09-29 NOTE — Progress Notes (Signed)
Bilateral lower extremity venous duplex has been completed. Preliminary results can be found in CV Proc through chart review.   09/29/23 12:33 PM Olen Cordial RVT

## 2023-09-29 NOTE — Evaluation (Signed)
Physical Therapy Assessment and Plan  Patient Details  Name: Carrie Chung MRN: [id number] Date of Birth: Jan 05, 1941  PT Diagnosis: Abnormal posture, Abnormality of gait, Difficulty walking, Edema, Impaired sensation, Muscle weakness, and Pain in joint Rehab Potential: Good ELOS: 7-10 days   Today's Date: 09/29/2023 PT Individual Time: 0815-0926 PT Individual Time Calculation (min): 71 min    Hospital Problem: Principal Problem:   Closed right hip fracture, sequela   Past Medical History:  Past Medical History:  Diagnosis Date   Atrial tachycardia (HCC)    a. noted at cardiac rehab 5/13; event monitor ordered to assess for AFib   CAD (coronary artery disease)    a. NSTEMI 03/2012 ([facility] 03/27/12: pLAD 30%, mLAD 99%, mCFX 95%, mRCA 99% with thrombus, EF 65%);  s/p PTCA/DESx1 to prox-mid RCA 03/27/12 urgently in setting of hypotension/bradycardia, with staged PTCA/Evolve study stent to mid LAD & PTCA/Evolve study stent to prox LCx 03/29/12 ;   echo 03/28/12:EF 60%, Aortic sclerosis without AS, mild RVE, mild reduced RVSF     Endometrial polyp    HTN (hypertension)    Hyperlipidemia    MI, old    Sleep related hypoxia    Past Surgical History:  Past Surgical History:  Procedure Laterality Date   APPENDECTOMY  82 years old   CAROTID STENT  04/2012   x3   CESAREAN SECTION  1984   CHOLECYSTECTOMY  1978   FEMUR IM NAIL Right 09/21/2023   Procedure: INTRAMEDULLARY (IM) NAIL FEMORAL;  Surgeon: Sheral Apley, MD;  Location: [facility];  Service: Orthopedics;  Laterality: Right;   INTRAMEDULLARY (IM) NAIL INTERTROCHANTERIC Left 06/20/2021   Procedure: INTRAMEDULLARY (IM) NAIL INTERTROCHANTRIC;  Surgeon: Sheral Apley, MD;  Location: [facility] OR;  Service: Orthopedics;  Laterality: Left;   LEFT HEART CATHETERIZATION WITH CORONARY ANGIOGRAM N/A 03/27/2012   Procedure: LEFT HEART CATHETERIZATION WITH CORONARY ANGIOGRAM;  Surgeon: Kathleene Hazel, MD;  Location: [facility] CATH LAB;  Service:  Cardiovascular;  Laterality: N/A;   PERCUTANEOUS CORONARY STENT INTERVENTION (PCI-S) N/A 03/27/2012   Procedure: PERCUTANEOUS CORONARY STENT INTERVENTION (PCI-S);  Surgeon: Kathleene Hazel, MD;  Location: [facility] CATH LAB;  Service: Cardiovascular;  Laterality: N/A;   PERCUTANEOUS CORONARY STENT INTERVENTION (PCI-S) N/A 03/29/2012   Procedure: PERCUTANEOUS CORONARY STENT INTERVENTION (PCI-S);  Surgeon: Tonny Bollman, MD;  Location: [facility] CATH LAB;  Service: Cardiovascular;  Laterality: N/A;   TONSILLECTOMY  82 years old    Assessment & Plan Clinical Impression: Patient is a 82 y.o. year old female with history of CAD, A trial tachycardia, HTN, OSA-intolerant of CPAP- on oxygen at nights for hypoxia, who was admitted on 09/20/23 after a mechanical fall with onset of right hip pain and inability to walk. She was found to have right IT femur fracture and underwent IM nail 10/01 by Dr. Eulah Pont. She was noted to have significant hypokalemia at admission with K-2.6 which was supplemented. ABLA treated with one unit PRBC. Therapy has been limited due to pain and fatigue. She has had decline in functional status and CIR recommended due to functional decline.   Patient currently requires min with mobility secondary to muscle weakness and muscle joint tightness, decreased cardiorespiratoy endurance, and decreased standing balance, decreased postural control, and decreased balance strategies.  Prior to hospitalization, patient was modified independent  with mobility and lived with Spouse in a House home.  Home access is  Stairs to enter, Ramped entrance (4 steps into garage with wall on 1 side and plans to get railing  installed. Pt reports having a ramp at front entrance).  Patient will benefit from skilled PT intervention to maximize safe functional mobility, minimize fall risk, and decrease caregiver burden for planned discharge home with intermittent assist.  Anticipate patient will benefit from follow up OP at  discharge.  PT - End of Session Activity Tolerance: Tolerates 30+ min activity with multiple rests Endurance Deficit: Yes Endurance Deficit Description: required rest break after ambulating in/out of bathroom PT Assessment Rehab Potential (ACUTE/IP ONLY): Good PT Barriers to Discharge: Home environment access/layout;Incontinence;Other (comments) PT Barriers to Discharge Comments: pain, decreased balance, incontinence, 2 story home, steps to enter PT Patient demonstrates impairments in the following area(s): Balance;Edema;Endurance;Motor;Pain;Sensory;Skin Integrity PT Transfers Functional Problem(s): Bed Mobility;Bed to Chair;Car;Furniture PT Locomotion Functional Problem(s): Ambulation;Wheelchair Mobility;Stairs PT Plan PT Intensity: Minimum of 1-2 x/day ,45 to 90 minutes PT Frequency: 5 out of 7 days PT Duration Estimated Length of Stay: 7-10 days PT Treatment/Interventions: Ambulation/gait training;Discharge planning;Functional mobility training;Psychosocial support;Therapeutic Activities;Balance/vestibular training;Disease management/prevention;Neuromuscular re-education;Skin care/wound management;Therapeutic Exercise;Wheelchair propulsion/positioning;DME/adaptive equipment instruction;Pain management;Splinting/orthotics;UE/LE Strength taining/ROM;Community reintegration;Patient/family education;Stair training;UE/LE Coordination activities PT Transfers Anticipated Outcome(s): Mod I with LRAD PT Locomotion Anticipated Outcome(s): Mod I with LRAD PT Recommendation Recommendations for Other Services: Therapeutic Recreation consult Therapeutic Recreation Interventions: Pet therapy;Stress management Follow Up Recommendations: Outpatient PT Patient destination: Home Equipment Recommended: To be determined Equipment Details: has RW and [facility]   PT Evaluation Precautions/Restrictions Precautions Precautions: Fall Restrictions Weight Bearing Restrictions: No Pain Interference Pain  Interference Pain Effect on Sleep: 1. Rarely or not at all Pain Interference with Therapy Activities: 1. Rarely or not at all Pain Interference with Day-to-Day Activities: 1. Rarely or not at all Home Living/Prior Functioning Home Living Available Help at Discharge: Personal care attendant;Other (Comment) (has aide who stays with her husband (who has Alzheimer's) all day and all evening, but when pt D/Cs home, aide will probably just stay during the day.) Type of Home: House Home Access: Stairs to enter;Ramped entrance (4 steps into garage with wall on 1 side and plans to get railing installed. Pt reports having a ramp at front entrance) Entrance Stairs-Rails: None Home Layout: Two level;Bed/bath upstairs;Able to live on main level with bedroom/bathroom;Full bath on main level (spare bedroom and full bath) Alternate Level Stairs-Number of Steps: 14 Alternate Level Stairs-Rails: Right Bathroom Shower/Tub: Tub/shower unit;Walk-in shower (tub/shower with TTB on main floor, walk in shower with seat upstairs) Bathroom Toilet: Standard Bathroom Accessibility: Yes Additional Comments: Pt reports using SPC out in community but walking without AD inside the home. Pt also has a RW, shower bench, and commode  Lives With: Spouse Prior Function Level of Independence: Requires assistive device for independence;Needs assistance with homemaking;Independent with basic ADLs;Independent with transfers;Independent with gait (intermittent use of SPC for mobility, setup A for ADLs by health aide) Meal Prep: Other (comment) (health aide provided meals along with daughter) Laundry: Other (comment) (health aide)  Able to Take Stairs?: Yes Driving: Yes Vision/Perception  Vision - History Ability to See in Adequate Light: 0 Adequate Perception Perception: Within Functional Limits Praxis Praxis: WFL  Cognition Overall Cognitive Status: Within Functional Limits for tasks assessed Arousal/Alertness:  Awake/alert Orientation Level: Oriented X4 Memory: Appears intact Awareness: Appears intact Problem Solving: Appears intact Safety/Judgment: Appears intact Sensation Sensation Light Touch: Impaired Detail Light Touch Impaired Details: Impaired RLE;Impaired LLE Hot/Cold: Not tested Proprioception: Appears Intact Stereognosis: Not tested Additional Comments: pt reports numbnes along bilateral plantar aspects of feet. Decreased sensation along RLE. Coordination Gross Motor Movements are Fluid and Coordinated: No Fine  Motor Movements are Fluid and Coordinated: Yes Coordination and Movement Description: altered balance strategies due to pain in R hip Finger Nose Finger Test: Mild tremor on LUE but WFL Heel Shin Test: WFL on LLE, unable to perform on RLE Motor  Motor Motor: Abnormal postural alignment and control Motor - Skilled Clinical Observations: altered balance strategies due to pain in R hip  Trunk/Postural Assessment  Cervical Assessment Cervical Assessment: Within Functional Limits Thoracic Assessment Thoracic Assessment: Exceptions to [facility] (thoracic rounding) Lumbar Assessment Lumbar Assessment: Within Functional Limits Postural Control Postural Control: Within Functional Limits  Balance Balance Balance Assessed: Yes Static Sitting Balance Static Sitting - Balance Support: Feet supported;Bilateral upper extremity supported Static Sitting - Level of Assistance: 6: Modified independent (Device/Increase time) Dynamic Sitting Balance Dynamic Sitting - Balance Support: Feet supported;No upper extremity supported Dynamic Sitting - Level of Assistance: 5: Stand by assistance (supervision) Static Standing Balance Static Standing - Balance Support: Bilateral upper extremity supported;During functional activity (RW) Static Standing - Level of Assistance: 5: Stand by assistance (CGA) Dynamic Standing Balance Dynamic Standing - Balance Support: Bilateral upper extremity  supported;During functional activity (RW) Dynamic Standing - Level of Assistance: 4: Min assist Dynamic Standing - Comments: transfers and gait Extremity Assessment  RLE Assessment RLE Assessment: Exceptions to [facility] General Strength Comments: tested sitting in WC RLE Strength Right Hip Flexion: 2+/5 Right Hip ABduction: 3/5 Right Hip ADduction: 3/5 Right Knee Flexion: 3+/5 Right Knee Extension: 3+/5 Right Ankle Dorsiflexion: 3+/5 Right Ankle Plantar Flexion: 3+/5 LLE Assessment LLE Assessment: Exceptions to [facility]: Lambert Rhonda W General Strength Comments: tested sitting in WC LLE Strength Left Hip Flexion: 4-/5 Left Hip ABduction: 4-/5 Left Hip ADduction: 4-/5 Left Knee Flexion: 4-/5 Left Knee Extension: 4-/5 Left Ankle Dorsiflexion: 4/5 Left Ankle Plantar Flexion: 4/5  Care Tool Care Tool Bed Mobility Roll left and right activity   Roll left and right assist level: Supervision/Verbal cueing    Sit to lying activity        Lying to sitting on side of bed activity   Lying to sitting on side of bed assist level: the ability to move from lying on the back to sitting on the side of the bed with no back support.: Supervision/Verbal cueing     Care Tool Transfers Sit to stand transfer   Sit to stand assist level: Minimal Assistance - Patient > 75%    Chair/bed transfer   Chair/bed transfer assist level: Minimal Assistance - Patient > 75%     Toilet transfer   Assist Level: Minimal Assistance - Patient > 75%    Car transfer   Car transfer assist level: Minimal Assistance - Patient > 75%      Care Tool Locomotion Ambulation   Assist level: Minimal Assistance - Patient > 75% Assistive device: Walker-rolling Max distance: 50ft  Walk 10 feet activity   Assist level: Minimal Assistance - Patient > 75% Assistive device: Walker-rolling   Walk 50 feet with 2 turns activity Walk 50 feet with 2 turns activity did not occur: Safety/medical concerns (fatigue, pain)      Walk 150 feet  activity Walk 150 feet activity did not occur: Safety/medical concerns (pain, fatigue)      Walk 10 feet on uneven surfaces activity Walk 10 feet on uneven surfaces activity did not occur: Safety/medical concerns (fatigue, pain)      Stairs Stair activity did not occur: Safety/medical concerns (fatigue, pain)        Walk up/down 1 step activity Walk up/down 1 step or  curb (drop down) activity did not occur: Safety/medical concerns (fatigue, pain)      Walk up/down 4 steps activity Walk up/down 4 steps activity did not occur: Safety/medical concerns (fatigue, pain)      Walk up/down 12 steps activity Walk up/down 12 steps activity did not occur: Safety/medical concerns (fatigue, pain)      Pick up small objects from floor Pick up small object from the floor (from standing position) activity did not occur: Safety/medical concerns (fatigue, pain)      Wheelchair Is the patient using a wheelchair?: Yes Type of Wheelchair: Manual   Wheelchair assist level: Dependent - Patient 0% Max wheelchair distance: >357ft  Wheel 50 feet with 2 turns activity   Assist Level: Dependent - Patient 0%  Wheel 150 feet activity   Assist Level: Dependent - Patient 0%    Refer to Care Plan for Long Term Goals  SHORT TERM GOAL WEEK 1 PT Short Term Goal 1 (Week 1): STG=LTG due to LOS  Recommendations for other services: Therapeutic Recreation  Pet therapy and Stress management  Skilled Therapeutic Intervention Evaluation completed (see details above and below) with education on PT POC and goals and individual treatment initiated with focus on functional mobility/transfers, generalized strengthening and endurance, dynamic standing balance/coordination, toileting, simulated car transfers, and ambulation. Received pt semi-reclined in bed, pt educated on PT evaluation, CIR policies, and therapy schedule and agreeable. Pt reported pain 5/10 in R hip - RN notified and present to administer pain medication.    Provided pt with 18x16 manual WC, youth RW, and R elevating legrest. Pt transferred semi-reclined<>sitting L EOB with HOB at 30 degrees (to simulate bed at home) with supervision and increased time using bedrails - pt with 2 L lateral LOB but able to self-correct. Stood from EOB with youth RW and min A and ambulated in/out of bathroom with RW and min A. Pt able to void and perform pericare without assist. Donned clean brief and 2nd gown with max A. Pt transported to/from room in [facility] dependently for time management purposes. Pt performed simulated car transfer with youth RW and min A for RLE management. Returned to room and MD arrived for morning rounds. Concluded session with pt sitting in WC, needs within reach, an seatbelt alarm on. Safety plan updated.   Mobility Bed Mobility Bed Mobility: Rolling Left;Supine to Sit Rolling Left: Supervision/Verbal cueing Supine to Sit: Supervision/Verbal cueing Transfers Transfers: Sit to Stand;Stand to Sit;Stand Pivot Transfers Sit to Stand: Minimal Assistance - Patient > 75% Stand to Sit: Contact Guard/Touching assist Stand Pivot Transfers: Minimal Assistance - Patient > 75% Transfer (Assistive device): Rolling walker Locomotion  Gait Ambulation: Yes Gait Assistance: Minimal Assistance - Patient > 75% Gait Distance (Feet): 10 Feet Assistive device: Rolling walker Gait Gait: Yes Gait Pattern: Impaired Gait Pattern: Step-to pattern;Step-through pattern;Decreased step length - right;Decreased step length - left;Decreased stride length;Antalgic;Poor foot clearance - left;Poor foot clearance - right;Narrow base of support Gait velocity: decreased Stairs / Additional Locomotion Stairs: No Wheelchair Mobility Wheelchair Mobility: No   Discharge Criteria: Patient will be discharged from PT if patient refuses treatment 3 consecutive times without medical reason, if treatment goals not met, if there is a change in medical status, if patient makes no  progress towards goals or if patient is discharged from hospital.  The above assessment, treatment plan, treatment alternatives and goals were discussed and mutually agreed upon: by patient  Huntley Dec PT, DPT 09/29/2023, 11:59 AM

## 2023-09-29 NOTE — Progress Notes (Signed)
Physical Therapy Session Note  Patient Details  Name: Carrie Chung MRN: [id number] Date of Birth: 07/01/1941  Today's Date: 09/29/2023 PT Individual Time: 1400-1457 PT Individual Time Calculation (min): 57 min   Short Term Goals: Week 1:  PT Short Term Goal 1 (Week 1): STG=LTG due to LOS  Skilled Therapeutic Interventions/Progress Updates:   Received pt slouched in bed, pt agreeable to PT treatment, and reported pain 7/10 in R hip - declined pain medication. Session with emphasis on functional mobility/transfers, generalized strengthening and endurance, dynamic standing balance/coordination, and gait training. Pt transferred semi-reclined<>sitting L EOB with HOB elevated and use of bedrails with supervision and increased time/effort to move RLE. Donned pants sitting EOB with max A and stood from EOB with RW and min A and required max A to pull pants over hips. NT arrived to check vitals, then pt transferred into [facility] via stand<>pivot with RW and min A.  Pt transported to dayroom in [facility] dependently for time management purposes. Stood from [facility] with RW and min A and ambulated 70ft with RW and CGA/fading to close supervision - pt demonstrating antalgic gait pattern. Pt then performed alternating toe taps to 3in step 2x10 with CGA - emphasis on hip flexor strengthening. Pt then performed R heel slides on towel 2x10 with emphasis on L knee flexion ROM - limited by increased pain and provided pt with ice pack for pain relief. Pt performed WC mobility 150ft using BUE and supervision back to room with emphasis on UE strength. Transferred WC<>recliner stand<>pivot with RW and min A and concluded session with pt sitting in recliner, needs within reach, and chair pad alarm on.   Therapy Documentation Precautions:  Restrictions Weight Bearing Restrictions: Yes RLE Weight Bearing: Weight bearing as tolerated  Therapy/Group: Individual Therapy Marlana Salvage Zaunegger Blima Rich PT, DPT 09/29/2023, 7:15 AM

## 2023-09-29 NOTE — Progress Notes (Signed)
Inpatient Rehabilitation Center Individual Statement of Services  Patient Name:  Carrie Chung  Date:  09/29/2023  Welcome to the Inpatient Rehabilitation Center.  Our goal is to provide you with an individualized program based on your diagnosis and situation, designed to meet your specific needs.  With this comprehensive rehabilitation program, you will be expected to participate in at least 3 hours of rehabilitation therapies Monday-Friday, with modified therapy programming on the weekends.  Your rehabilitation program will include the following services:  Physical Therapy (PT), Occupational Therapy (OT), Speech Therapy (ST), 24 hour per day rehabilitation nursing, Therapeutic Recreaction (TR), Neuropsychology, Care Coordinator, Rehabilitation Medicine, Nutrition Services, Pharmacy Services, and Other  Weekly team conferences will be held on Wednesdays to discuss your progress.  Your Inpatient Rehabilitation Care Coordinator will talk with you frequently to get your input and to update you on team discussions.  Team conferences with you and your family in attendance may also be held.  Expected length of stay: 10-12 Days  Overall anticipated outcome: mod I and supervision goals   Depending on your progress and recovery, your program may change. Your Inpatient Rehabilitation Care Coordinator will coordinate services and will keep you informed of any changes. Your Inpatient Rehabilitation Care Coordinator's name and contact numbers are listed  below.  The following services may also be recommended but are not provided by the Inpatient Rehabilitation Center:   Home Health Rehabiltiation Services Outpatient Rehabilitation Services    Arrangements will be made to provide these services after discharge if needed.  Arrangements include referral to agencies that provide these services.  Your insurance has been verified to be:  Medicare A & B Your primary doctor is:  Marlan Palau,  MD  Pertinent information will be shared with your doctor and your insurance company.  Inpatient Rehabilitation Care Coordinator:  Lavera Guise, [place] [phone] or [phone]  Information discussed with and copy given to patient by: Andria Rhein, 09/29/2023, 9:24 AM

## 2023-09-29 NOTE — Patient Care Conference (Incomplete)
Inpatient RehabilitationTeam Conference and Plan of Care Update Date: 09/29/2023   Time: 10:55 PM    Patient Name: Carrie Chung      Medical Record Number: [id number]  Date of Birth: 03-03-1941 Sex: Female         Room/Bed: 4W26C/4W26C-01 Payor Info: Payor: MEDICARE / Plan: MEDICARE PART A AND B / Product Type: *No Product type* /    Admit Date/Time:  09/28/2023  1:34 PM  Primary Diagnosis:  Closed right hip fracture, sequela  Hospital Problems: Principal Problem:   Closed right hip fracture, sequela    Expected Discharge Date: Expected Discharge Date: 10/07/23  Team Members Present: Physician leading conference: Dr. Sula Soda Social Worker Present: Lavera Guise, BSW Nurse Present: Chana Bode, RN PT Present: Raechel Chute, PT OT Present: Candee Furbish, OT PPS Coordinator present : Fae Pippin, SLP     Current Status/Progress Goal Weekly Team Focus  Bowel/Bladder   Pt is continent of bowel/bladder   Pt to remain continent of bowel/bladder   Will assess qshift and PRN    Swallow/Nutrition/ Hydration               ADL's   Supervision UB, Min A LB, Min A toileting   Mod I   Barriers- endurance, dynamic standing balance, LB care due to Rt hip pain    Mobility   bed mobility supervision using bed features, transfers with RW min A, gait 75ft with RW min A   Mod I  barriers: pain management, global weakness/deconditioning, decreased balance/coordination    Communication                Safety/Cognition/ Behavioral Observations               Pain   Pt c/o hip pain   Pt's pain is controlled on medication   Will assess qshift and PRN    Skin   Pt has bruising on right hip and mid section   Pt's bruises will go away  Will assess qshift and PRN      Discharge Planning:      Team Discussion: Patient post closed right hip fracture with hypokalemia and Vitamin D level; poor endurance.  Patient on target to meet rehab goals: yes,  currently needs supervision for upper body care and min assist for lower body care and toileting. Needs min assist for transfers and able to ambulate up to 10' using a RW with CGA - min assist. Goals for discharge set for mod I overall.   *See Care Plan and progress notes for long and short-term goals.   Revisions to Treatment Plan:  D/C CBG monitoring Nutritional supplement   Teaching Needs: Safety, medications, skin care, transfers, toileting, etc.    Current Barriers to Discharge: Decreased caregiver support and Home enviroment access/layout  Possible Resolutions to Barriers: OP follow up services Family education     Medical Summary Current Status: hypotension, postoperative pain, hypocalcemia, hypoalbuminemia, hyperglycemia  Barriers to Discharge: Medical stability  Barriers to Discharge Comments: hypotension, postoperative pain, hypocalcemia, hypoalbuminemia, hyperglycemia Possible Resolutions to [employer] Focus: continue to monitor blood pressure TID, provided dietary education, started calcium supplement, d/c CBG checks   Continued Need for Acute Rehabilitation Level of Care: The patient requires daily medical management by a physician with specialized training in physical medicine and rehabilitation for the following reasons: Direction of a multidisciplinary physical rehabilitation program to maximize functional independence : Yes Medical management of patient stability for increased activity during participation in an intensive  rehabilitation regime.: Yes Analysis of laboratory values and/or radiology reports with any subsequent need for medication adjustment and/or medical intervention. : Yes   I attest that I was present, lead the team conference, and concur with the assessment and plan of the team.   Chana Bode B 09/29/2023, 10:55 PM

## 2023-09-29 NOTE — Progress Notes (Addendum)
Patient ID: Carrie Chung, female   DOB: 24-May-1941, 82 y.o.   MRN: [id number]  Team Conference Report to Patient/Family  Team Conference discussion was reviewed with the patient and caregiver, including goals, any changes in plan of care and target discharge date.  Patient and caregiver express understanding and are in agreement.  The patient has a target discharge date of 10/07/23.  SW met with patient, introduced self and explained role. Patient anticipates returning home with caregivers. Pt has shared that her spouse has dementia and they currently have assistance. Pt expressed that caregivers will also be able to assist her at d/c. Anticipating MOD I goals.  Patient concerned about transportation for appointments. Sw offered [employer], patient will like to discuss with her daughter. Currently patient feels as she can manage transportation with friends. Sw will follow up with patient on conversation with daughter. No additional questions or concerns.   Sw will call daughter to introduced self and provide contact information.   2:44 PM: Sw spoke with daughter, Shaune Pascal and provided daughter with same informed. Daughter has confirmed that caregivers and herself will be able to transport pt to appointments. Daughter confirmed pt currently has a BSC and RW. SW will continue to follow up. No additional questions or concerns.   Andria Rhein 09/29/2023, 2:32 PM

## 2023-09-29 NOTE — Progress Notes (Signed)
PROGRESS NOTE   Subjective/Complaints: Hypotensive Has no new complaints  Did well with PT this morning Labs stable, discussed with patient  ROS: +pain at site of hip fracture, right hip   Objective:   No results found. Recent Labs    09/27/23 0413 09/29/23 0501  WBC 7.8 4.9  HGB 9.6* 9.5*  HCT 30.9* 29.7*  PLT 115* 142*   Recent Labs    09/27/23 0413 09/29/23 0501  NA 136 139  K 3.2* 3.9  CL 97* 100  CO2 27 30  GLUCOSE 110* 102*  BUN 10 11  CREATININE 0.64 0.70  CALCIUM 8.3* 8.5*    Intake/Output Summary (Last 24 hours) at 09/29/2023 1052 Last data filed at 09/29/2023 0821 Gross per 24 hour  Intake 356 ml  Output --  Net 356 ml        Physical Exam: Vital Signs Blood pressure (!) 120/59, pulse 71, temperature 98.8 F (37.1 C), temperature source Oral, resp. rate 16, height 5' (1.524 m), weight 73.5 kg, SpO2 99%. Gen: no distress, normal appearing HEENT: oral mucosa pink and moist, NCAT Cardio: Reg rate Chest: normal effort, normal rate of breathing Abd: soft, non-distended Ext: no edema Psych: pleasant, normal affect Musculoskeletal:     Cervical back: Neck supple. No tenderness.     Comments: Ue's 5/5 B/L LLE 5-/5 and RLE- HF 2/5; otherwise 5-/5 distally   Skin:    General: Skin is warm and dry.     Comments: Severe ecchymoses, purple/yellow bruising from R upper lateral hip to just below R lateral knee Medial R knee swelling Incisions no active bleeding- have steristrips- dried blood around incisions Irritation under breast  Neurological:     Mental Status: She is alert.     Comments: Intact to light touch Ox3- but can be tangential  Psychiatric:        Behavior: Behavior normal.      Assessment/Plan: 1. Functional deficits which require 3+ hours per day of interdisciplinary therapy in a comprehensive inpatient rehab setting. Physiatrist is providing close team supervision and 24  hour management of active medical problems listed below. Physiatrist and rehab team continue to assess barriers to discharge/monitor patient progress toward functional and medical goals  Care Tool:  Bathing              Bathing assist       Upper Body Dressing/Undressing Upper body dressing        Upper body assist      Lower Body Dressing/Undressing Lower body dressing            Lower body assist       Toileting Toileting    Toileting assist       Transfers Chair/bed transfer  Transfers assist     Chair/bed transfer assist level: Minimal Assistance - Patient > 75%     Locomotion Ambulation   Ambulation assist      Assist level: Minimal Assistance - Patient > 75% Assistive device: Walker-rolling Max distance: 47ft   Walk 10 feet activity   Assist     Assist level: Minimal Assistance - Patient > 75% Assistive device: Walker-rolling   Walk 50 feet  activity   Assist Walk 50 feet with 2 turns activity did not occur: Safety/medical concerns (fatigue, pain)         Walk 150 feet activity   Assist Walk 150 feet activity did not occur: Safety/medical concerns (pain, fatigue)         Walk 10 feet on uneven surface  activity   Assist Walk 10 feet on uneven surfaces activity did not occur: Safety/medical concerns (fatigue, pain)         Wheelchair     Assist Is the patient using a wheelchair?: Yes Type of Wheelchair: Manual    Wheelchair assist level: Dependent - Patient 0% Max wheelchair distance: >390ft    Wheelchair 50 feet with 2 turns activity    Assist        Assist Level: Dependent - Patient 0%   Wheelchair 150 feet activity     Assist      Assist Level: Dependent - Patient 0%   Blood pressure (!) 120/59, pulse 71, temperature 98.8 F (37.1 C), temperature source Oral, resp. rate 16, height 5' (1.524 m), weight 73.5 kg, SpO2 99%.  Medical Problem List and Plan: 1. Functional deficits  secondary to R hip fracture s/p IM nail fixation- WBAT             -patient may  shower if ocver incisions             -ELOS/Goals: 1-0-12 days mod I to supervision Admit to CIR  Team conference today  2.  Antithrombotics: -DVT/anticoagulation:  Pharmaceutical: Lovenox added--will check dopplers to rule out DVT in setting of Fx/immobility             -antiplatelet therapy:  Plavix 3. Pain Management: Will schedule tylenol. Use ice prn.oxycodone prn.  4. Mood/Behavior/Sleep:              -antipsychotic agents: N/A 5. Neuropsych/cognition: This patient is usually capable of making decisions on her own behalf. 6. Skin/Wound Care: Routine pressure relief measures.  7. Fluids/Electrolytes/Nutrition: Monitor I/O. Check CMET in am 8. Confusion/dizziness: On scoplamine patch likely post op-->discontinued             --will check orthostatic vitals. Limit narcotics.  9. OSA/Nocturnal hypoxia: Continue Oxygen at nights.  --Pulmonary hygiene during the day 10. CAD s/p stent: Treated medically with DAPT and  Crestor.  11. Thrombocytopenia: Monitor for signs of bleeding.  12. ABLA: Improved post transfusion. Continue to monitor.              --has hx of iron deficiency in the past. Will add iron supplement.  13. Recurrent hypokalemia: Will continue supplement for a few days. Mg WNL 14. HTN/Hx of atrial tachycardia: Continue Cardizem and metoprolol.    15. Anxiety/depression: On Effexor XR with xanax at bedtime. Magnesium supplement added after supper  16. Hypotension: decrease metoprolol to 75mg  BID.   17. Skin irritation under breast: nystatin powder ordered  18. Hypocalcemia: calcium supplement ordered  19. Hypokalemia: potassium supplement started  >50 minutes spent in discussion of her labs, low potassium, low calcium, nystatin ordered for skin irritation under breast, supplements ordered after supper, team conference, chart reviewed, metoprolol decreased given hypotension    LOS: 1  days A FACE TO FACE EVALUATION WAS PERFORMED  Carrie Chung 09/29/2023, 10:52 AM

## 2023-09-30 DIAGNOSIS — S72001S Fracture of unspecified part of neck of right femur, sequela: Secondary | ICD-10-CM | POA: Diagnosis not present

## 2023-09-30 LAB — CBC
HCT: 29.3 % — ABNORMAL LOW (ref 36.0–46.0)
Hemoglobin: 9.1 g/dL — ABNORMAL LOW (ref 12.0–15.0)
MCH: 27.6 pg (ref 26.0–34.0)
MCHC: 31.1 g/dL (ref 30.0–36.0)
MCV: 88.8 fL (ref 80.0–100.0)
Platelets: 173 10*3/uL (ref 150–400)
RBC: 3.3 MIL/uL — ABNORMAL LOW (ref 3.87–5.11)
RDW: 14.5 % (ref 11.5–15.5)
WBC: 4.9 10*3/uL (ref 4.0–10.5)
nRBC: 0 % (ref 0.0–0.2)

## 2023-09-30 LAB — BASIC METABOLIC PANEL
Anion gap: 7 (ref 5–15)
BUN: 11 mg/dL (ref 8–23)
CO2: 30 mmol/L (ref 22–32)
Calcium: 8.2 mg/dL — ABNORMAL LOW (ref 8.9–10.3)
Chloride: 101 mmol/L (ref 98–111)
Creatinine, Ser: 0.71 mg/dL (ref 0.44–1.00)
GFR, Estimated: >60 mL/min (ref >60)
Glucose, Bld: 112 mg/dL — ABNORMAL HIGH (ref 70–99)
Potassium: 4 mmol/L (ref 3.5–5.1)
Sodium: 138 mmol/L (ref 135–145)

## 2023-09-30 LAB — GLUCOSE, CAPILLARY
Glucose-Capillary: 112 mg/dL — ABNORMAL HIGH (ref 70–99)
Glucose-Capillary: 103 mg/dL — ABNORMAL HIGH (ref 70–99)
Glucose-Capillary: 132 mg/dL — ABNORMAL HIGH (ref 70–99)
Glucose-Capillary: 133 mg/dL — ABNORMAL HIGH (ref 70–99)

## 2023-09-30 MED ORDER — METOPROLOL TARTRATE 50 MG PO TABS
50.0000 mg | ORAL_TABLET | Freq: Two times a day (BID) | ORAL | Status: DC
  Administered 2023-09-30 – 2023-10-01 (×2): 50 mg via ORAL
  Filled 2023-09-30 (×2): qty 1

## 2023-09-30 MED ORDER — ACETAMINOPHEN 500 MG PO TABS
1000.0000 mg | ORAL_TABLET | Freq: Three times a day (TID) | ORAL | Status: DC
  Administered 2023-09-30 – 2023-10-09 (×25): 1000 mg via ORAL
  Filled 2023-09-30 (×27): qty 2

## 2023-09-30 NOTE — Progress Notes (Signed)
PROGRESS NOTE   Subjective/Complaints: Pain is still present- discussed increasing the tylenol.  Hypotensive HR well controlled  ROS: +pain at site of hip fracture, right hip- conitnues   Objective:   VAS [place] LOWER EXTREMITY VENOUS (DVT)  Result Date: 09/29/2023  Lower Venous DVT Study Patient Name:  Carrie Chung  Date of Exam:   09/29/2023 Medical Rec #: [id number]           Accession #:    [id number] Date of Birth: 29-Dec-1940           Patient Gender: F Patient Age:   82 years Exam Location:  [facility] Procedure:      VAS [place] LOWER EXTREMITY VENOUS (DVT) Referring Phys: PAMELA LOVE --------------------------------------------------------------------------------  Indications: Immobility.  Risk Factors: Surgery Trauma. Limitations: Poor ultrasound/tissue interface and patient positioning. Comparison Study: No prior studies. Performing Technologist: Chanda Busing RVT  Examination Guidelines: A complete evaluation includes B-mode imaging, spectral Doppler, color Doppler, and power Doppler as needed of all accessible portions of each vessel. Bilateral testing is considered an integral part of a complete examination. Limited examinations for reoccurring indications may be performed as noted. The reflux portion of the exam is performed with the patient in reverse Trendelenburg.  +---------+---------------+---------+-----------+----------+--------------+ RIGHT    CompressibilityPhasicitySpontaneityPropertiesThrombus Aging +---------+---------------+---------+-----------+----------+--------------+ CFV      Full           Yes      Yes                                 +---------+---------------+---------+-----------+----------+--------------+ SFJ      Full                                                        +---------+---------------+---------+-----------+----------+--------------+ FV Prox  Full                                                         +---------+---------------+---------+-----------+----------+--------------+ FV Mid   Full                                                        +---------+---------------+---------+-----------+----------+--------------+ FV DistalFull                                                        +---------+---------------+---------+-----------+----------+--------------+ PFV      Full                                                        +---------+---------------+---------+-----------+----------+--------------+  POP      Full           Yes      Yes                                 +---------+---------------+---------+-----------+----------+--------------+ PTV      Full                                                        +---------+---------------+---------+-----------+----------+--------------+ PERO     Full                                                        +---------+---------------+---------+-----------+----------+--------------+   +---------+---------------+---------+-----------+----------+--------------+ LEFT     CompressibilityPhasicitySpontaneityPropertiesThrombus Aging +---------+---------------+---------+-----------+----------+--------------+ CFV      Full           Yes      Yes                                 +---------+---------------+---------+-----------+----------+--------------+ SFJ      Full                                                        +---------+---------------+---------+-----------+----------+--------------+ FV Prox  Full                                                        +---------+---------------+---------+-----------+----------+--------------+ FV Mid   Full                                                        +---------+---------------+---------+-----------+----------+--------------+ FV DistalFull                                                         +---------+---------------+---------+-----------+----------+--------------+ PFV      Full                                                        +---------+---------------+---------+-----------+----------+--------------+ POP      Full           Yes      Yes                                 +---------+---------------+---------+-----------+----------+--------------+  PTV      Full                                                        +---------+---------------+---------+-----------+----------+--------------+ PERO     Full                                                        +---------+---------------+---------+-----------+----------+--------------+     Summary: RIGHT: - There is no evidence of deep vein thrombosis in the lower extremity.  - No cystic structure found in the popliteal fossa.  LEFT: - There is no evidence of deep vein thrombosis in the lower extremity.  - No cystic structure found in the popliteal fossa.  *See table(s) above for measurements and observations. Electronically signed by Coral Else MD on 09/29/2023 at 9:03:09 PM.    Final    Recent Labs    09/29/23 0501 09/30/23 0631  WBC 4.9 4.9  HGB 9.5* 9.1*  HCT 29.7* 29.3*  PLT 142* 173   Recent Labs    09/29/23 0501 09/30/23 0631  NA 139 138  K 3.9 4.0  CL 100 101  CO2 30 30  GLUCOSE 102* 112*  BUN 11 11  CREATININE 0.70 0.71  CALCIUM 8.5* 8.2*    Intake/Output Summary (Last 24 hours) at 09/30/2023 1208 Last data filed at 09/29/2023 1820 Gross per 24 hour  Intake 600 ml  Output --  Net 600 ml        Physical Exam: Vital Signs Blood pressure (!) 108/45, pulse 73, temperature 98.5 F (36.9 C), resp. rate 18, height 5' (1.524 m), weight 73.5 kg, SpO2 97%. Gen: no distress, normal appearing HEENT: oral mucosa pink and moist, NCAT Cardio: Reg rate Chest: normal effort, normal rate of breathing Abd: soft, non-distended Ext: no edema Psych: pleasant, normal affect Musculoskeletal:      Cervical back: Neck supple. No tenderness.     Comments: Ue's 5/5 B/L LLE 5-/5 and RLE- HF 2/5; otherwise 5-/5 distally   Skin:    General: Skin is warm and dry.     Comments: Severe ecchymoses, purple/yellow bruising from R upper lateral hip to just below R lateral knee Medial R knee swelling Incisions no active bleeding- have steristrips- dried blood around incisions Irritation under breast  Neurological:     Mental Status: She is alert.     Comments: Intact to light touch Ox3- but can be tangential  Psychiatric:        Behavior: Behavior normal.      Assessment/Plan: 1. Functional deficits which require 3+ hours per day of interdisciplinary therapy in a comprehensive inpatient rehab setting. Physiatrist is providing close team supervision and 24 hour management of active medical problems listed below. Physiatrist and rehab team continue to assess barriers to discharge/monitor patient progress toward functional and medical goals  Care Tool:  Bathing    Body parts bathed by patient: Right arm, Left arm, Chest, Abdomen, Front perineal area, Buttocks, Right upper leg, Left upper leg, Face   Body parts bathed by helper: Right lower leg, Left lower leg     Bathing assist Assist Level: Minimal Assistance - Patient > 75%  Upper Body Dressing/Undressing Upper body dressing   What is the patient wearing?: Pull over shirt, Bra    Upper body assist Assist Level: Set up assist    Lower Body Dressing/Undressing Lower body dressing      What is the patient wearing?: Underwear/pull up, Pants     Lower body assist Assist for lower body dressing: Minimal Assistance - Patient > 75%     Toileting Toileting    Toileting assist Assist for toileting: Minimal Assistance - Patient > 75%     Transfers Chair/bed transfer  Transfers assist     Chair/bed transfer assist level: Minimal Assistance - Patient > 75%     Locomotion Ambulation   Ambulation assist       Assist level: Minimal Assistance - Patient > 75% Assistive device: Walker-rolling Max distance: 142ft   Walk 10 feet activity   Assist     Assist level: Minimal Assistance - Patient > 75% Assistive device: Walker-rolling   Walk 50 feet activity   Assist Walk 50 feet with 2 turns activity did not occur: Safety/medical concerns (fatigue, pain)  Assist level: Minimal Assistance - Patient > 75% Assistive device: Walker-rolling    Walk 150 feet activity   Assist Walk 150 feet activity did not occur: Safety/medical concerns (pain, fatigue)         Walk 10 feet on uneven surface  activity   Assist Walk 10 feet on uneven surfaces activity did not occur: Safety/medical concerns (fatigue, pain)         Wheelchair     Assist Is the patient using a wheelchair?: Yes Type of Wheelchair: Manual    Wheelchair assist level: Dependent - Patient 0% Max wheelchair distance: >319ft    Wheelchair 50 feet with 2 turns activity    Assist        Assist Level: Dependent - Patient 0%   Wheelchair 150 feet activity     Assist      Assist Level: Dependent - Patient 0%   Blood pressure (!) 108/45, pulse 73, temperature 98.5 F (36.9 C), resp. rate 18, height 5' (1.524 m), weight 73.5 kg, SpO2 97%.  Medical Problem List and Plan: 1. Functional deficits secondary to R hip fracture s/p IM nail fixation- WBAT             -patient may  shower if ocver incisions             -ELOS/Goals: 1-0-12 days mod I to supervision Admit to CIR  Team conference today  2.  Antithrombotics: -DVT/anticoagulation:  Pharmaceutical: Lovenox added--will check dopplers to rule out DVT in setting of Fx/immobility             -antiplatelet therapy:  Plavix 3. Pain Management: Will schedule tylenol. Use ice prn.oxycodone prn.  4. Mood/Behavior/Sleep:              -antipsychotic agents: N/A 5. Neuropsych/cognition: This patient is usually capable of making decisions on her own  behalf. 6. Skin/Wound Care: Routine pressure relief measures.  7. Fluids/Electrolytes/Nutrition: Monitor I/O. Check CMET in am 8. Confusion/dizziness: On scoplamine patch likely post op-->discontinued             --will check orthostatic vitals. Limit narcotics.  9. OSA/Nocturnal hypoxia: Continue Oxygen at nights.  --Pulmonary hygiene during the day 10. CAD s/p stent: Treated medically with DAPT and  Crestor.  11. Thrombocytopenia: Monitor for signs of bleeding.  12. ABLA: Improved post transfusion. Continue to monitor.              --  has hx of iron deficiency in the past. Will add iron supplement.  13. Recurrent hypokalemia: Will continue supplement for a few days. Mg WNL 14. HTN/Hx of atrial tachycardia: Continue Cardizem and metoprolol.    15. Anxiety/depression: Continue Effexor XR with xanax at bedtime. Magnesium supplement added after supper  16. Hypotension: decrease metoprolol to 50mg  BID.   17. Skin irritation under breast: nystatin powder ordered, continue  18. Hypocalcemia: calcium supplement ordered, continue  19. Hypokalemia: potassium supplement started    LOS: 2 days A FACE TO FACE EVALUATION WAS PERFORMED  Clint Bolder P Youssef Footman 09/30/2023, 12:08 PM

## 2023-09-30 NOTE — Progress Notes (Signed)
Physical Therapy Session Note  Patient Details  Name: Carrie Chung MRN: [id number] Date of Birth: 10-Jun-1941  Today's Date: 09/30/2023 PT Individual Time: [phone] and [phone] and 3244-0102 PT Individual Time Calculation (min): 41 min and 56 min and 41 min  Short Term Goals: Week 1:  PT Short Term Goal 1 (Week 1): STG=LTG due to LOS  Skilled Therapeutic Interventions/Progress Updates:   Treatment Session 1 Received pt semi-reclined in bed, pt agreeable to PT treatment, and reported pain 6/10 in R hip - RN notified and present to administer medication. Session with emphasis on functional mobility/transfers, dressing, toileting, generalized strengthening and endurance, and ambulation. Pt reported urge to void and transferred semi-reclined<>sitting L EOB with HOB elevated and use of bedrails with min A for trunk control due to getting stuck in hole of bed. Pt with 2 lateral/posterior LOB, requiring light mod A and reaching for footboard to correct LOB. Pt able to scoot to EOB to get feet on floor with increased time/effort but no physical assist. Stood from EOB with RW and min A and ambulated in/out of bathroom with RW and min A - pt with 1 large posterior LOB, requiring mod A to correct. Pt able to manage underwear, void, and perform peri-care with min A for standing balance. Sat in recliner and doffed gown and donned bra and pull over shirt with set up assist. Donned pants with max A to thread LEs through and stood from recliner with RW and CGA to pull pants over hips. Concluded session with pt sitting in recliner, needs within reach, and chair pad alarm on with RN at bedside. Provided pt with ice pack to R hip for pain relief.   Treatment Session 2 Received pt sitting uncomfortably in recliner, pt agreeable to PT treatment, and reported pain 8/10 in R hip (premedicated). Session with emphasis on functional mobility/transfers, generalized strengthening and ROM, and gait training. Stood from  recliner with RW and CGA, but experienced sudden increase in pain resulting in posterior LOB into recliner. Pt performed remainder of transfers with RW and CGA/min A throughout session. Pt sat in WC at sink and brushed teeth with set up assist, then transported to/from dayroom in [facility] dependently for time management purposes.   Transferred on/off Nustep with RW and min A and performed seated BUE/LE strengthening on Nustep at workload 1 for 16 minutes (per pt request) for a total of 355 steps with emphasis on cardiovascular endurance, LE strength, and R knee ROM. Pt required assist to get RLE on/off footplate, but demonstrated improved R knee flexion ROM with repetition and feeling "much better" afterwards. Pt then ambulated 170ft with RW and CGA including 1 turn - pt ambulates at decreased cadence with antalgic gait pattern, decreased weight shifting onto RLE, and decreased R foot clearance, Returned to room and concluded session with pt sitting in [facility] with all needs within reach. Ice pack placed on R hip for pain relief and edema management.   Treatment Session 3 Received pt sitting in WC, pt agreeable to PT treatment, and reported pain 4/10 in R hip, increasing with mobility - RN notified and present to administer medication. Session with emphasis on functional mobility/transfers, generalized strengthening and endurance, and ROM. Pt transported to/from room in [facility] dependently for time management purposes. Pt performed all transfers with RW and CGA/min A throughout session - cues to kick RLE out and reach ack for WC armrest when sitting - pt reported reduction in pain with this technique. Pt performed the following  exercises with emphasis on LE strength/ROM: -LAQ 2x10 on RLE -active assisted towel slides 2x10 on RLE -AAROM R hip flexion 2x10 -hip adduction ball squeezes 2x10 Worked on pre-gait stepping over hockey stick with RLE 2x10 with CGA for balance and emphasis on hip flexor strengthening. Returned to  room and concluded session with pt sitting in recliner with all needs within reach awaiting upcoming OT session.   Therapy Documentation Precautions:  Precautions Precautions: Fall Restrictions Weight Bearing Restrictions: Yes RLE Weight Bearing: Weight bearing as tolerated  Therapy/Group: Individual Therapy Marlana [employer]age Zaunegger Blima Rich PT, DPT 09/30/2023, 6:57 AM

## 2023-09-30 NOTE — Progress Notes (Signed)
Occupational Therapy Session Note  Patient Details  Name: Carrie Chung MRN: [id number] Date of Birth: 1941/06/21  Today's Date: 09/30/2023 OT Individual Time: 1406-1505 OT Individual Time Calculation (min): 59 min    Short Term Goals: Week 1:  OT Short Term Goal 1 (Week 1): STG = LTG due to ELOS  Skilled Therapeutic Interventions/Progress Updates:  Pt greeted seated in w/c, pt agreeable to OT intervention.      Transfers/bed mobility: pt completed sit>stands with RW and CGA. Pt completed ambulatory toilet transfer with RW and CGA.    Therapeutic activity:  Worked on short distance functional mobility to simulate home level functional mobility with pt transporting items in between ~ 109ft a part to facilitate improved RW mgmt I.e tight turns. Pt completed task with Rw and CGA. Decreased activity tolerance noted with pt needing 1 standing rest break d/t fatigue.    Pt able to stand for 3 mins with no UE support to complete card dealing task with supervision to facilitate improved standing tolerance for ADL participation..    ADLs:  Grooming: pt stood at sink for hand hygiene with supervision.   Toileting: pt completed 3/3 toileting tasks with MIN A needing assist to pull pants to waist line and MIN vcs needed to keep feet under hips to provide optimal stability. Continent urine void.     Ended session with pt seated in recliner with all needs within reach and NT present.  Therapy Documentation Precautions:  Precautions Precautions: Fall Restrictions Weight Bearing Restrictions: Yes RLE Weight Bearing: Weight bearing as tolerated  Pain: 6/10 pain reported in RLE, rest breaks and repositioning, and ice provided as needed.   Therapy/Group: Individual Therapy  Barron Schmid 09/30/2023, 3:10 PM

## 2023-10-01 DIAGNOSIS — S72001S Fracture of unspecified part of neck of right femur, sequela: Secondary | ICD-10-CM | POA: Diagnosis not present

## 2023-10-01 LAB — GLUCOSE, CAPILLARY
Glucose-Capillary: 120 mg/dL — ABNORMAL HIGH (ref 70–99)
Glucose-Capillary: 138 mg/dL — ABNORMAL HIGH (ref 70–99)
Glucose-Capillary: 100 mg/dL — ABNORMAL HIGH (ref 70–99)
Glucose-Capillary: 136 mg/dL — ABNORMAL HIGH (ref 70–99)

## 2023-10-01 MED ORDER — PSYLLIUM 95 % PO PACK
1.0000 | PACK | Freq: Every day | ORAL | Status: DC
  Administered 2023-10-01 – 2023-10-09 (×8): 1 via ORAL
  Filled 2023-10-01 (×9): qty 1

## 2023-10-01 MED ORDER — CLOTRIMAZOLE 1 % EX CREA
TOPICAL_CREAM | Freq: Two times a day (BID) | CUTANEOUS | Status: DC
  Administered 2023-10-08: 1 via TOPICAL
  Filled 2023-10-01: qty 15

## 2023-10-01 MED ORDER — METOPROLOL TARTRATE 25 MG PO TABS
25.0000 mg | ORAL_TABLET | Freq: Two times a day (BID) | ORAL | Status: DC
  Administered 2023-10-01 – 2023-10-08 (×13): 25 mg via ORAL
  Filled 2023-10-01 (×14): qty 1

## 2023-10-01 MED ORDER — SALINE SPRAY 0.65 % NA SOLN: 1.0000 | NASAL | Status: DC | PRN

## 2023-10-01 NOTE — Progress Notes (Signed)
Physical Therapy Session Note  Patient Details  Name: Carrie Chung MRN: [id number] Date of Birth: Jun 12, 1941  Today's Date: 10/01/2023 PT Individual Time: 0732-0842 PT Individual Time Calculation (min): 70 min   Short Term Goals: Week 1:  PT Short Term Goal 1 (Week 1): STG=LTG due to LOS  Skilled Therapeutic Interventions/Progress Updates:   Received pt semi-reclined in bed, just waking up. Pt agreeable to PT treatment and reported pain 4/10 in R hip (premedicated). Session with emphasis on functional mobility/transfers, toileting, dressing, generalized strengthening and endurance, and gait training. Pt transferred semi-reclined<>sitting L EOB with HOB elevated (to simulate bed at home) and use of bedrails with supervision and increased time due to sitting in hole. Required x 2 attempts and CGA/light min A to stand from EOB and ambulated in/out of bathroom with RW and CGA/light min A. Pt able to manage underwear standing with CGA for balance, able to void, and performed pericare standing with CGA. Removed dirty underwear and donned clean ones with max A to thread LEs through, but when standing to pull over hips, pt demo posterior LOB onto toilet.    Doffed gown and donned bra and pull over shirt with set up assist. Donned pants with max A to thread LEs through, and stood at sink with close supervision (1 UE on sink and 1 UE on WC armrest) to pull pants over hips. Stood at sink and combed hair and brushed teeth with close supervision. Applied deodorant and slipped on shoes without assist. Pt performed WC mobility 132ft using BUE and supervision to dayroom with emphasis on UE strength. Stood from [facility] with RW and CGA x 2 trials and ambulated 151ft x 1 and 194ft x 1 with RW and CGA - cues to kick RLE out when sitting to minimize pain - requested continuous flow ice pack from MD. Pt ambulates with antalgic gait pattern, flexed trunk/downward gaze, and decreased weight shifting onto RLE, but easily  correctable with cues. Transported back to room in [facility] dependently and concluded session with pt sitting in WC, needs within reach. Set pt up to eat breakfast and provided pt with ice pack for R hip.   Therapy Documentation Precautions:  Precautions Precautions: Fall Restrictions Weight Bearing Restrictions: Yes RLE Weight Bearing: Weight bearing as tolerated  Therapy/Group: Individual Therapy Marlana Salvage Zaunegger Blima Rich PT, DPT 10/01/2023, 6:49 AM

## 2023-10-01 NOTE — Progress Notes (Signed)
PROGRESS NOTE   Subjective/Complaints: Continues to have pain in hip and ice pack does not last long, ordered aquathermia machine set on cold and discussed with nursing and PT  ROS: +pain at site of hip fracture, right hip- continues   Objective:   VAS [place] LOWER EXTREMITY VENOUS (DVT)  Result Date: 09/29/2023  Lower Venous DVT Study Patient Name:  Carrie Chung  Date of Exam:   09/29/2023 Medical Rec #: [id number]           Accession #:    [id number] Date of Birth: 26-Jun-1941           Patient Gender: F Patient Age:   82 years Exam Location:  [facility] Procedure:      VAS [place] LOWER EXTREMITY VENOUS (DVT) Referring Phys: PAMELA LOVE --------------------------------------------------------------------------------  Indications: Immobility.  Risk Factors: Surgery Trauma. Limitations: Poor ultrasound/tissue interface and patient positioning. Comparison Study: No prior studies. Performing Technologist: Chanda Busing RVT  Examination Guidelines: A complete evaluation includes B-mode imaging, spectral Doppler, color Doppler, and power Doppler as needed of all accessible portions of each vessel. Bilateral testing is considered an integral part of a complete examination. Limited examinations for reoccurring indications may be performed as noted. The reflux portion of the exam is performed with the patient in reverse Trendelenburg.  +---------+---------------+---------+-----------+----------+--------------+ RIGHT    CompressibilityPhasicitySpontaneityPropertiesThrombus Aging +---------+---------------+---------+-----------+----------+--------------+ CFV      Full           Yes      Yes                                 +---------+---------------+---------+-----------+----------+--------------+ SFJ      Full                                                         +---------+---------------+---------+-----------+----------+--------------+ FV Prox  Full                                                        +---------+---------------+---------+-----------+----------+--------------+ FV Mid   Full                                                        +---------+---------------+---------+-----------+----------+--------------+ FV DistalFull                                                        +---------+---------------+---------+-----------+----------+--------------+ PFV      Full                                                        +---------+---------------+---------+-----------+----------+--------------+  POP      Full           Yes      Yes                                 +---------+---------------+---------+-----------+----------+--------------+ PTV      Full                                                        +---------+---------------+---------+-----------+----------+--------------+ PERO     Full                                                        +---------+---------------+---------+-----------+----------+--------------+   +---------+---------------+---------+-----------+----------+--------------+ LEFT     CompressibilityPhasicitySpontaneityPropertiesThrombus Aging +---------+---------------+---------+-----------+----------+--------------+ CFV      Full           Yes      Yes                                 +---------+---------------+---------+-----------+----------+--------------+ SFJ      Full                                                        +---------+---------------+---------+-----------+----------+--------------+ FV Prox  Full                                                        +---------+---------------+---------+-----------+----------+--------------+ FV Mid   Full                                                         +---------+---------------+---------+-----------+----------+--------------+ FV DistalFull                                                        +---------+---------------+---------+-----------+----------+--------------+ PFV      Full                                                        +---------+---------------+---------+-----------+----------+--------------+ POP      Full           Yes      Yes                                 +---------+---------------+---------+-----------+----------+--------------+  PTV      Full                                                        +---------+---------------+---------+-----------+----------+--------------+ PERO     Full                                                        +---------+---------------+---------+-----------+----------+--------------+     Summary: RIGHT: - There is no evidence of deep vein thrombosis in the lower extremity.  - No cystic structure found in the popliteal fossa.  LEFT: - There is no evidence of deep vein thrombosis in the lower extremity.  - No cystic structure found in the popliteal fossa.  *See table(s) above for measurements and observations. Electronically signed by Coral Else MD on 09/29/2023 at 9:03:09 PM.    Final    Recent Labs    09/29/23 0501 09/30/23 0631  WBC 4.9 4.9  HGB 9.5* 9.1*  HCT 29.7* 29.3*  PLT 142* 173   Recent Labs    09/29/23 0501 09/30/23 0631  NA 139 138  K 3.9 4.0  CL 100 101  CO2 30 30  GLUCOSE 102* 112*  BUN 11 11  CREATININE 0.70 0.71  CALCIUM 8.5* 8.2*    Intake/Output Summary (Last 24 hours) at 10/01/2023 1059 Last data filed at 09/30/2023 1340 Gross per 24 hour  Intake 236 ml  Output --  Net 236 ml        Physical Exam: Vital Signs Blood pressure (!) 104/50, pulse (!) 58, temperature (!) 97.4 F (36.3 C), resp. rate 17, height 5' (1.524 m), weight 73.5 kg, SpO2 100%. Gen: no distress, normal appearing HEENT: oral mucosa pink and moist,  NCAT Cardio: Bradycardic Chest: normal effort, normal rate of breathing Abd: soft, non-distended Ext: no edema Psych: pleasant, normal affect Musculoskeletal:     Cervical back: Neck supple. No tenderness.     Comments: Ue's 5/5 B/L LLE 5-/5 and RLE- HF 2/5; otherwise 5-/5 distally   Skin:    General: Skin is warm and dry.     Comments: Severe ecchymoses, purple/yellow bruising from R upper lateral hip to just below R lateral knee Medial R knee swelling Incisions no active bleeding- have steristrips- dried blood around incisions Intertriginous dermatitis under right breast  Neurological:     Mental Status: She is alert.     Comments: Intact to light touch Ox3- but can be tangential  Psychiatric:        Behavior: Behavior normal.      Assessment/Plan: 1. Functional deficits which require 3+ hours per day of interdisciplinary therapy in a comprehensive inpatient rehab setting. Physiatrist is providing close team supervision and 24 hour management of active medical problems listed below. Physiatrist and rehab team continue to assess barriers to discharge/monitor patient progress toward functional and medical goals  Care Tool:  Bathing    Body parts bathed by patient: Right arm, Left arm, Chest, Abdomen, Front perineal area, Buttocks, Right upper leg, Left upper leg, Face   Body parts bathed by helper: Right lower leg, Left lower leg     Bathing assist Assist Level: Minimal Assistance -  Patient > 75%     Upper Body Dressing/Undressing Upper body dressing   What is the patient wearing?: Pull over shirt, Bra    Upper body assist Assist Level: Set up assist    Lower Body Dressing/Undressing Lower body dressing      What is the patient wearing?: Underwear/pull up, Pants     Lower body assist Assist for lower body dressing: Minimal Assistance - Patient > 75%     Toileting Toileting    Toileting assist Assist for toileting: Minimal Assistance - Patient > 75%      Transfers Chair/bed transfer  Transfers assist     Chair/bed transfer assist level: Contact Guard/Touching assist     Locomotion Ambulation   Ambulation assist      Assist level: Contact Guard/Touching assist Assistive device: Walker-rolling Max distance: 166ft   Walk 10 feet activity   Assist     Assist level: Contact Guard/Touching assist Assistive device: Walker-rolling   Walk 50 feet activity   Assist Walk 50 feet with 2 turns activity did not occur: Safety/medical concerns (fatigue, pain)  Assist level: Contact Guard/Touching assist Assistive device: Walker-rolling    Walk 150 feet activity   Assist Walk 150 feet activity did not occur: Safety/medical concerns (pain, fatigue)  Assist level: Contact Guard/Touching assist Assistive device: Walker-rolling    Walk 10 feet on uneven surface  activity   Assist Walk 10 feet on uneven surfaces activity did not occur: Safety/medical concerns (fatigue, pain)         Wheelchair     Assist Is the patient using a wheelchair?: Yes Type of Wheelchair: Manual    Wheelchair assist level: Dependent - Patient 0% Max wheelchair distance: >323ft    Wheelchair 50 feet with 2 turns activity    Assist        Assist Level: Dependent - Patient 0%   Wheelchair 150 feet activity     Assist      Assist Level: Dependent - Patient 0%   Blood pressure (!) 104/50, pulse (!) 58, temperature (!) 97.4 F (36.3 C), resp. rate 17, height 5' (1.524 m), weight 73.5 kg, SpO2 100%.  Medical Problem List and Plan: 1. Functional deficits secondary to R hip fracture s/p IM nail fixation- WBAT             -patient may  shower if ocver incisions             -ELOS/Goals: 1-0-12 days mod I to supervision Grounds pass ordered  2.  Antithrombotics: -DVT/anticoagulation:  Pharmaceutical: Lovenox added--will check dopplers to rule out DVT in setting of Fx/immobility             -antiplatelet therapy:   Plavix 3. Pain Management: Will schedule tylenol. Use ice prn.oxycodone prn. Aquathermia- cold ordered prn 4. Mood/Behavior/Sleep:              -antipsychotic agents: N/A 5. Neuropsych/cognition: This patient is usually capable of making decisions on her own behalf. 6. Skin/Wound Care: Routine pressure relief measures.  7. Fluids/Electrolytes/Nutrition: Monitor I/O. Check CMET in am 8. Confusion/dizziness: On scoplamine patch likely post op-->discontinued             --will check orthostatic vitals. Limit narcotics.  9. OSA/Nocturnal hypoxia: Continue Oxygen at nights.  --Pulmonary hygiene during the day 10. CAD s/p stent: Treated medically with DAPT and  Crestor.  11. Thrombocytopenia: Monitor for signs of bleeding.  12. ABLA: Improved post transfusion. Continue to monitor.              --  has hx of iron deficiency in the past. Will add iron supplement.  13. Recurrent hypokalemia: Will continue supplement for a few days. Mg WNL 14. HTN/Hx of atrial tachycardia: Continue Cardizem and metoprolol.    15. Anxiety/depression: Continue Effexor XR with xanax at bedtime. Magnesium supplement added after supper  16. Hypotension: decrease metoprolol to 25mg  BID  17. Skin irritation under breast: nystatin powder ordered, continue  18. Hypocalcemia: calcium supplement ordered, continue  19. Hypokalemia: potassium supplement started  20. Constipation: psyllium ordered  21. Intertriginous dermatitis: clotrimazole ordered    LOS: 3 days A FACE TO FACE EVALUATION WAS PERFORMED  Desirea Mizrahi P Tynslee Bowlds 10/01/2023, 11:00 AM

## 2023-10-01 NOTE — Progress Notes (Signed)
Occupational Therapy Session Note  Patient Details  Name: Carrie Chung MRN: [id number] Date of Birth: [birth year]  Today's Date: 10/01/2023 OT Individual Time: 1003-1045 & 1420-1530 OT Individual Time Calculation (min): 42 min & 70 min   Short Term Goals: Week 1:  OT Short Term Goal 1 (Week 1): STG = LTG due to ELOS  Skilled Therapeutic Interventions/Progress Updates:  Session 1 Skilled OT intervention completed with focus on nutrition education, dynamic standing balance, standing tolerance and BUE endurance. Pt received seated in w/c, agreeable to session. Unrated pain reported in Rt hip; pre-medicated and ice already applied. OT offered rest breaks, repositioning throughout for pain reduction.  Pt declined self-care needs at current time with agreement to shower in PM. Food services present to order meals, with pt reporting that she is a picky eater and struggles to find things she likes in hospital and fears that cooking for herself at home will be a challenge due to RLE weakness. Discussed healthy diet for bone healing, as well as plan for simulated cooking task and working on standing tolerance in prep for meal prep.  Transported dependently in w/c <> gym for time. Pt participated in the following dynamic standing balance and endurance tasks to promote independence and safety during BADLs and functional mobility: -cornhole toss activity. Min A sit > stand with RW, and CGA needed for balance with pt intermittently not using RW but still only CGA for balance. No LOB, however seated rest breaks provided for fatigue.   Seated in w/c, pt completed the following BUE exercises to promote BUE strength/endurance needed for independence with functional transfers and BADLs: (With 3 pound dowel) -2x15 chest press -2x15 overhead press  Back in room, pt remained seated in w/c, with chair alarm on/activated, and with all needs in reach at end of session.  Session 2 Skilled OT intervention  completed with focus on ADL retraining, functional endurance, and mobility within a shower context. Pt received seated in w/c, agreeable to session. 9/10 pain reported in Rt hip; nurse notified of pain med request and administered at start of session. OT offered rest breaks, repositioning and moist heat via shower for pain relief.  Pt completed all sit > stands with min A using RW and ambulatory transfers with CGA using RW. Min cues needed for body positioning throughout for pain management and fall prevention.  Ambulated > shower with TTB using grab bar for side stepping. Min A needed to doff LB clothing.   No waterproof cover needed on Rt hip per nurse due to foam bandage in place; notified of bandage change at end of session. Pt was able to bathe all parts with supervision while seated with cues for accessing all parts, but CGA at the stance level while using grab bar for balance. Utilized long handled sponge to access BLE and periareas as applicable. Min A transfer using grab bar > w/c. Able to donn shirt/deo with set up A. Threaded underwear with pad with min A; discussed donning weaker leg first. Able to stand at sink for over hips. Donned shoes with total A. Applied face cream and completed other grooming with setup A.  Ambulated > recliner. Pt remained seated in recliner with BLE elevated, k-pad for ice on Rt hip, chair alarm on/activated, and with all needs in reach at end of session.   Therapy Documentation Precautions:  Precautions Precautions: Fall Restrictions Weight Bearing Restrictions: Yes RLE Weight Bearing: Weight bearing as tolerated    Therapy/Group: Individual Therapy  [place] E  Cheyenne Adas, MS, OTR/L  10/01/2023, 3:35 PM

## 2023-10-01 NOTE — Progress Notes (Signed)
Patient ID: Carrie Chung, female   DOB: Aug 06, 1941, 82 y.o.   MRN: [id number]  Current recommendation for OP. No DME recommendations currently.

## 2023-10-01 NOTE — IPOC Note (Signed)
Overall Plan of Care [facility]) Patient Details Name: Carrie Chung MRN: [id number] DOB: 1941/10/01  Admitting Diagnosis: Closed right hip fracture, sequela  Hospital Problems: Principal Problem:   Closed right hip fracture, sequela     Functional Problem List: Nursing Safety, Endurance, Medication Management, Pain, Bowel  PT Balance, Edema, Endurance, Motor, Pain, Sensory, Skin Integrity  OT Balance, Edema, Endurance, Motor, Pain, Sensory, Skin Integrity  SLP    TR         Basic ADL's: OT Bathing, Dressing, Toileting     Advanced  ADL's: OT Simple Meal Preparation     Transfers: PT Bed Mobility, Bed to Chair, Car, Occupational psychologist, Research scientist (life sciences): PT Ambulation, Psychologist, prison and probation services, Stairs     Additional Impairments: OT None  SLP        TR      Anticipated Outcomes Item Anticipated Outcome  Self Feeding Independent  Swallowing      Basic self-care  Mod I  Toileting  Mod I   Bathroom Transfers Mod I  Bowel/Bladder  manage bowel w mod I assist  Transfers  Mod I with LRAD  Locomotion  Mod I with LRAD  Communication     Cognition     Pain  < 4 with prns  Safety/Judgment  manage w cues   Therapy Plan: PT Intensity: Minimum of 1-2 x/day ,45 to 90 minutes PT Frequency: 5 out of 7 days PT Duration Estimated Length of Stay: 7-10 days OT Intensity: Minimum of 1-2 x/day, 45 to 90 minutes OT Frequency: 5 out of 7 days OT Duration/Estimated Length of Stay: 7-10 days     Team Interventions: Nursing Interventions Discharge Planning, Medication Management, Disease Management/Prevention, Pain Management, Patient/Family Education  PT interventions Ambulation/gait training, Discharge planning, Functional mobility training, Psychosocial support, Therapeutic Activities, Balance/vestibular training, Disease management/prevention, Neuromuscular re-education, Skin care/wound management, Therapeutic Exercise, Wheelchair propulsion/positioning,  DME/adaptive equipment instruction, Pain management, Splinting/orthotics, UE/LE Strength taining/ROM, Firefighter, Equities trader education, Museum/gallery curator, UE/LE Coordination activities  OT Interventions Warden/ranger, Discharge planning, Pain management, Self Care/advanced ADL retraining, UE/LE Coordination activities, Therapeutic Activities, Disease mangement/prevention, Functional mobility training, Patient/family education, Skin care/wound managment, Therapeutic Exercise, Community reintegration, DME/adaptive equipment instruction, Neuromuscular re-education, UE/LE Strength taining/ROM  SLP Interventions    TR Interventions    SW/CM Interventions     Barriers to Discharge MD  Medical stability  Nursing Decreased caregiver support, Home environment access/layout 2 level 3/4 ste or 1 ste to ramp in front with spouse (dementia/aide 5x/week; 9a-5p) DME: Rolling Walker (2 wheels), Hand held shower head, Cane - single point, Crutches, Tub bench, BSC/3in1, Other (comment) (leg lifter, home oxygen)  Additional Comments: downstairs does not have walk in shower, has to scoot into tub using shower bench downstairs  PT Home environment access/layout, Incontinence, Other (comments) pain, decreased balance, incontinence, 2 story home, steps to enter  OT Home environment access/layout, Wound Care    SLP      SW       Team Discharge Planning: Destination: PT-Home ,OT- Home , SLP-  Projected Follow-up: PT-Outpatient PT, OT-  None, SLP-  Projected Equipment Needs: PT-To be determined, OT- To be determined, SLP-  Equipment Details: PT-has RW and SPC, OT-  Patient/family involved in discharge planning: PT- Patient,  OT-Patient, SLP-   MD ELOS: 10-12 days Medical Rehab Prognosis:  Excellent Assessment: The patient has been admitted for CIR therapies with the diagnosis of closed right hip fracture. The team will be addressing functional mobility,  strength, stamina, balance,  safety, adaptive techniques and equipment, self-care, bowel and bladder mgt, patient and caregiver education.Goals have been set at modI. Anticipated discharge destination is home.       See Team Conference Notes for weekly updates to the plan of care

## 2023-10-02 DIAGNOSIS — S72001D Fracture of unspecified part of neck of right femur, subsequent encounter for closed fracture with routine healing: Secondary | ICD-10-CM | POA: Diagnosis not present

## 2023-10-02 LAB — GLUCOSE, CAPILLARY
Glucose-Capillary: 108 mg/dL — ABNORMAL HIGH (ref 70–99)
Glucose-Capillary: 87 mg/dL (ref 70–99)
Glucose-Capillary: 124 mg/dL — ABNORMAL HIGH (ref 70–99)
Glucose-Capillary: 112 mg/dL — ABNORMAL HIGH (ref 70–99)

## 2023-10-02 MED ORDER — FLUTICASONE PROPIONATE 50 MCG/ACT NA SUSP
1.0000 | Freq: Every day | NASAL | Status: AC
  Administered 2023-10-02 – 2023-10-03 (×2): 1 via NASAL
  Filled 2023-10-02: qty 16

## 2023-10-02 MED ORDER — BACLOFEN 5 MG HALF TABLET
5.0000 mg | ORAL_TABLET | Freq: Every day | ORAL | Status: DC
  Administered 2023-10-02: 5 mg via ORAL
  Filled 2023-10-02: qty 1

## 2023-10-02 NOTE — Progress Notes (Signed)
Occupational Therapy Session Note  Patient Details  Name: Carrie Chung MRN: [id number] Date of Birth: Jul 11, 1941  Today's Date: 10/02/2023 OT Individual Time: 1350-1430 OT Individual Time Calculation (min): 40 min    Short Term Goals: Week 1:  OT Short Term Goal 1 (Week 1): STG = LTG due to ELOS  Skilled Therapeutic Interventions/Progress Updates:  Skilled OT intervention completed with focus on ambulatory endurance, toileting needs. Pt received seated in recliner with daughter and MD present, agreeable to session. 7/10 pain reported in Rt hip; pre-medicated. OT offered rest breaks and repositioning throughout for pain reduction.  MD inquiring pt's nasal dryness and raspy voice (though noted to be improved from yesterday by this OT), and OT notified of earlier request for humidity to be applied to pt's O2 at night time and saline spray; MD to reorder.  Pt agreeable to trial void due to it being several hours since last trial. CGA sit > stand using RW, then CGA ambulatory transfer > toilet with RW. Able to doff clothing down but needed min A to lower due to low toilet seat and associated pain. Incontinent/further continent of urinary void. Min A to stand from toilet, then CGA for peri-hygiene, min A for donning pad, then pants over hips with CGA. Ambulated with CGA > w/c.   Transported dependently in w/c <> gym. Pt stood with min A from w/c using RW, then ambulated 160 ft using RW with CGA increasing to min A with fatigue. Pt did take very brief standing rest breaks 3 different times, but was very motivated to make it back to w/c. Discussed ways to monitor pain, fatigue and its effect on mobility/safety. Also discussed icing before and following therapy for pain reduction as well as trial of thigh TED hose for edema management.  Back in room, pt remained seated in w/c, with chair alarm on/activated, ice applied to Rt hip and with all needs in reach at end of session.   Therapy  Documentation Precautions:  Precautions Precautions: Fall Restrictions Weight Bearing Restrictions: Yes RLE Weight Bearing: Weight bearing as tolerated   Therapy/Group: Individual Therapy  Melvyn Novas, MS, OTR/L  10/02/2023, 2:37 PM

## 2023-10-02 NOTE — Progress Notes (Signed)
Occupational Therapy Session Note  Patient Details  Name: Carrie Chung MRN: [id number] Date of Birth: [birth year]  Today's Date: 10/02/2023 OT Individual Time: 4098-1191 OT Individual Time Calculation (min): 58 min    Short Term Goals: Week 1:  OT Short Term Goal 1 (Week 1): STG = LTG due to ELOS  Skilled Therapeutic Interventions/Progress Updates: Patient received sitting on the EOB receiving morning medicine. Patient agreeable to OT treatment. Patient assisted to w/c in front of  sink for am ADL tasks as listed below. Patient with good tolerance of standing for bathing and grooming tasks. Needed some assist to get the RLE into the pant leg, but otherwise displayed good balance when managing clothing. Patient able to ambulate with RW with close SBA for functional transfers/toileting tasks. Patient assisted with transfer from walker to recliner at the end of treatment session. Continue with skilled OT POC and evaluate the need for dressing equipment such as a reacher moving forward.      Therapy Documentation Precautions:  Precautions Precautions: Fall Restrictions Weight Bearing Restrictions: Yes RLE Weight Bearing: Weight bearing as tolerated General:   Vital Signs: Therapy Vitals Temp: 97.9 F (36.6 C) Temp Source: Oral Pulse Rate: 77 Resp: 18 BP: (!) 113/58 Patient Position (if appropriate): Lying Oxygen Therapy SpO2: 99 % O2 Device: Nasal Cannula Pain:6/10; medicine given immediately prior to treatment.   ADL: ADL Eating: Set up Where Assessed-Eating: Chair Grooming: Setup Where Assessed-Grooming: Sitting at sink Upper Body Bathing: Setup Where Assessed-Upper Body Bathing: Sitting at sink Lower Body Bathing: Minimal assistance Where Assessed-Lower Body Bathing: Sitting at sink, Standing at sink Upper Body Dressing: Setup Where Assessed-Upper Body Dressing: Sitting at sink Lower Body Dressing: Minimal assistance Where Assessed-Lower Body Dressing: Sitting  at sink, Standing at sink Toileting: Minimal assistance (simulated during dressing) Where Assessed-Toileting: Teacher, adult education: Curator Method: Proofreader: Raised toilet seat, Other (comment) (RW)   Therapy/Group: Individual Therapy  [name] 10/02/2023, 12:46 PM

## 2023-10-02 NOTE — Progress Notes (Signed)
Physical Therapy Session Note  Patient Details  Name: Carrie Chung MRN: [id number] Date of Birth: 1941-09-24  Today's Date: 10/02/2023 PT Individual Time: 1000-1043 PT Individual Time Calculation (min): 43 min   Short Term Goals: Week 1:  PT Short Term Goal 1 (Week 1): STG=LTG due to LOS  Skilled Therapeutic Interventions/Progress Updates:      Pt sitting in recliner to start with her daughter leaving the room upon PT entrance. Pt reports 1/10 R hip pain while resting, evolving with weight bearing and ambulation - rest breaks and distraction provided for pain management.  Sit<>stand to RW with CGA from recliner, cues for hand placement to push from arm rests. She ambulates with CGA and RW from her room to day room gym, ~168ft with x1 brief standing rest break. Gait is antalgic with decreased stance time on R foot, trunk is somewhat flexed at the hips, and shoulders are elevated with heavy reliance of UE support through RW.  Donned 2.5 # ankle weights bilaterally and she completed further gait training 153ft with CGA and RW - similar deficits as above but fatigues more quickly with ankle weights. LAQ 2x25 completed at [facility] with same weight while encouraging full ROM on RLE for full knee extension.  Setup on Nustep and she completed 9 minutes at L8 resistance using BUE/BLE for AAROM for her R side. She completed a total of 250 steps.   She ambulated back to her room ~153ft with CGA and RW - increased fatigue and more intermittent standing rest breaks. Reports her arms feel "tired" from supporting her weight through RW while ambulating.  VC for upright posture, forward gaze, and shoulder depression.  Ended session seated in recliner with all needs met, call bell within reach.   Therapy Documentation Precautions:  Precautions Precautions: Fall Restrictions Weight Bearing Restrictions: Yes RLE Weight Bearing: Weight bearing as tolerated General:     Therapy/Group: Individual  Therapy  Korbyn Chopin P Chasty Randal  PT, DPT, CSRS  10/02/2023, 10:23 AM

## 2023-10-02 NOTE — Progress Notes (Signed)
PROGRESS NOTE   Subjective/Complaints: No events overnight. R hip pain 7-8/10 consistently/  Vitals stable with mild bradycardia and hypotension; asymptomatic Last BM 10/7  ROS: +pain at site of hip fracture, right hip- continues   Objective:   No results found. Recent Labs    09/30/23 0631  WBC 4.9  HGB 9.1*  HCT 29.3*  PLT 173   Recent Labs    09/30/23 0631  NA 138  K 4.0  CL 101  CO2 30  GLUCOSE 112*  BUN 11  CREATININE 0.71  CALCIUM 8.2*    Intake/Output Summary (Last 24 hours) at 10/02/2023 1128 Last data filed at 10/01/2023 1812 Gross per 24 hour  Intake 716 ml  Output --  Net 716 ml        Physical Exam: Vital Signs Blood pressure (!) 113/58, pulse 77, temperature 97.9 F (36.6 C), temperature source Oral, resp. rate 18, height 5' (1.524 m), weight 72.8 kg, SpO2 99%. Gen: no distress, normal appearing HEENT: oral mucosa pink and moist, NCAT Cardio: Bradycardic Chest: normal effort, normal rate of breathing Abd: soft, non-distended Ext: no edema Psych: pleasant, normal affect Musculoskeletal:     Cervical back: Neck supple. No tenderness.     Comments: Ue's 5/5 B/L LLE 5-/5 and RLE- HF 2/5; otherwise 5-/5 distally   Skin:    General: Skin is warm and dry.     Comments: Severe ecchymoses, purple/yellow bruising from R upper lateral hip to just below R lateral knee Medial R knee swelling Incisions no active bleeding- have steristrips- dried blood around incisions Intertriginous dermatitis under right breast  Neurological:     Mental Status: She is alert.     Comments: Intact to light touch Ox3- but can be tangential  Psychiatric:        Behavior: Behavior normal.      Assessment/Plan: 1. Functional deficits which require 3+ hours per day of interdisciplinary therapy in a comprehensive inpatient rehab setting. Physiatrist is providing close team supervision and 24 hour management  of active medical problems listed below. Physiatrist and rehab team continue to assess barriers to discharge/monitor patient progress toward functional and medical goals  Care Tool:  Bathing    Body parts bathed by patient: Face, Left upper leg, Right upper leg, Buttocks, Front perineal area, Abdomen, Chest, Left arm, Right arm   Body parts bathed by helper: Right lower leg, Left lower leg     Bathing assist Assist Level: Minimal Assistance - Patient > 75%     Upper Body Dressing/Undressing Upper body dressing   What is the patient wearing?: Bra, Pull over shirt    Upper body assist Assist Level: Set up assist    Lower Body Dressing/Undressing Lower body dressing      What is the patient wearing?: Underwear/pull up, Pants     Lower body assist Assist for lower body dressing: Contact Guard/Touching assist     Toileting Toileting    Toileting assist Assist for toileting: Minimal Assistance - Patient > 75%     Transfers Chair/bed transfer  Transfers assist     Chair/bed transfer assist level: Contact Guard/Touching assist     Locomotion Ambulation  Ambulation assist      Assist level: Contact Guard/Touching assist Assistive device: Walker-rolling Max distance: 156ft   Walk 10 feet activity   Assist     Assist level: Contact Guard/Touching assist Assistive device: Walker-rolling   Walk 50 feet activity   Assist Walk 50 feet with 2 turns activity did not occur: Safety/medical concerns (fatigue, pain)  Assist level: Contact Guard/Touching assist Assistive device: Walker-rolling    Walk 150 feet activity   Assist Walk 150 feet activity did not occur: Safety/medical concerns (pain, fatigue)  Assist level: Contact Guard/Touching assist Assistive device: Walker-rolling    Walk 10 feet on uneven surface  activity   Assist Walk 10 feet on uneven surfaces activity did not occur: Safety/medical concerns (fatigue, pain)          Wheelchair     Assist Is the patient using a wheelchair?: Yes Type of Wheelchair: Manual    Wheelchair assist level: Dependent - Patient 0% Max wheelchair distance: >354ft    Wheelchair 50 feet with 2 turns activity    Assist        Assist Level: Dependent - Patient 0%   Wheelchair 150 feet activity     Assist      Assist Level: Dependent - Patient 0%   Blood pressure (!) 113/58, pulse 77, temperature 97.9 F (36.6 C), temperature source Oral, resp. rate 18, height 5' (1.524 m), weight 72.8 kg, SpO2 99%.  Medical Problem List and Plan: 1. Functional deficits secondary to R hip fracture s/p IM nail fixation- WBAT             -patient may  shower if ocver incisions             -ELOS/Goals: 1-0-12 days mod I to supervision Grounds pass ordered  2.  Antithrombotics: -DVT/anticoagulation:  Pharmaceutical: Lovenox added--will check dopplers to rule out DVT in setting of Fx/immobility             -antiplatelet therapy:  Plavix 3. Pain Management: Will schedule tylenol. Use ice prn.oxycodone prn. Aquathermia- cold ordered prn   - 10/12: Pain poorly controlled; Schedule robaxin ***  4. Mood/Behavior/Sleep:              -antipsychotic agents: N/A 5. Neuropsych/cognition: This patient is usually capable of making decisions on her own behalf. 6. Skin/Wound Care: Routine pressure relief measures.  7. Fluids/Electrolytes/Nutrition: Monitor I/O. Check CMET in am 8. Confusion/dizziness: On scoplamine patch likely post op-->discontinued             --will check orthostatic vitals. Limit narcotics.  9. OSA/Nocturnal hypoxia: Continue Oxygen at nights.  --Pulmonary hygiene during the day 10. CAD s/p stent: Treated medically with DAPT and  Crestor.  11. Thrombocytopenia: Monitor for signs of bleeding.  12. ABLA: Improved post transfusion. Continue to monitor.              --has hx of iron deficiency in the past. Will add iron supplement.  13. Recurrent hypokalemia: Will  continue supplement for a few days. Mg WNL 14. HTN/Hx of atrial tachycardia: Continue Cardizem and metoprolol.    15. Anxiety/depression: Continue Effexor XR with xanax at bedtime. Magnesium supplement added after supper  16. Hypotension: decrease metoprolol to 25mg  BID  17. Skin irritation under breast: nystatin powder ordered, continue  18. Hypocalcemia: calcium supplement ordered, continue  19. Hypokalemia: potassium supplement started  20. Constipation: psyllium ordered   - 10/12: LBM 10/7 per chart; ***   21. Intertriginous dermatitis: clotrimazole ordered  LOS: 4 days A FACE TO FACE EVALUATION WAS PERFORMED  Angelina Sheriff 10/02/2023, 11:28 AM

## 2023-10-02 NOTE — Progress Notes (Signed)
Physical Therapy Session Note  Patient Details  Name: Carrie Chung MRN: [id number] Date of Birth: 1941/07/15  Today's Date: 10/02/2023 PT Individual Time: 1122-1202 PT Individual Time Calculation (min): 40 min   Short Term Goals: Week 1:  PT Short Term Goal 1 (Week 1): STG=LTG due to LOS  Skilled Therapeutic Interventions/Progress Updates: Patient sitting in recliner on entrance to room. Patient alert and agreeable to PT session.   Patient reported "less than" 5/10 pain in R hip, and that it was greater than that a little earlier from previous PT session. Pt sit to stand from recliner with CGA for safety to RW. Pt initially to ambulate from room to day room gym, but then reported 8/10 pain in R hip. Pt required WC transport to day room gym due to pain (pt also requested use of Nustep as to avoid further agitation of R hip). Pt propelled WC from room to day room with supervision/light minA due to WC veering off to the L (pt reported it was the [facility] causing that). When in day room, PTA sat in [facility] and showed pt that she is pushing with more strength on R UE vs L UE, which is causing that to happen. Pt transferred WC<>nustep with CGA/close supervision. Pt performed on level 6 with VC to only use B LE to increase resistance and WB. Pt did 627 steps in 10 minutes with VC to stay above 50 steps per minute. Pt with no reports of SOB. Pt ambulated from day room gym back to room in RW (presented with antalgic gait pattern on R LE with decreased stance time) and forward flexed posture with VC to extend through hips. Pt ambulated to nsg station and required rest break due to 7/10 pain in R hip. Pt self propelled WC rest of the way in order to increase UE strength. Pt provided with VC to increase strength in L UE in order to steady WC. Pt with decreased veer to the L and required min cues to increase L UE strength. Pt back in room with RW transfer from [facility] with supervision and left with all needs in reach, brakes  locked and NT present.       Therapy Documentation Precautions:  Precautions Precautions: Fall Restrictions Weight Bearing Restrictions: Yes RLE Weight Bearing: Weight bearing as tolerated  Therapy/Group: Individual Therapy  Alea Ryer PTA 10/02/2023, 12:12 PM

## 2023-10-03 DIAGNOSIS — S72001D Fracture of unspecified part of neck of right femur, subsequent encounter for closed fracture with routine healing: Secondary | ICD-10-CM | POA: Diagnosis not present

## 2023-10-03 LAB — GLUCOSE, CAPILLARY
Glucose-Capillary: 99 mg/dL (ref 70–99)
Glucose-Capillary: 107 mg/dL — ABNORMAL HIGH (ref 70–99)
Glucose-Capillary: 116 mg/dL — ABNORMAL HIGH (ref 70–99)
Glucose-Capillary: 135 mg/dL — ABNORMAL HIGH (ref 70–99)

## 2023-10-03 MED ORDER — BACLOFEN 5 MG HALF TABLET
5.0000 mg | ORAL_TABLET | Freq: Two times a day (BID) | ORAL | Status: DC
  Administered 2023-10-03 – 2023-10-09 (×13): 5 mg via ORAL
  Filled 2023-10-03 (×13): qty 1

## 2023-10-03 NOTE — Progress Notes (Signed)
PROGRESS NOTE   Subjective/Complaints: No events overnight. R hip pain seems better controlled with at bedtime baclofen; no complaints recorded bewteen 2040 and 0943. Patient thinks she woke up in tremendous pain because she fell asleep in a poor position; she is cautious to continue muscle relaxers but willing to try.  Vitals stable; no further bradycardia.  Last BM 10/12  ROS: +pain at site of hip fracture, right hip- continues Positives per HPI above. Denies fevers, chills, N/V, abdominal pain, SOB, chest pain, new weakness or paraesthesias.     Objective:   No results found. No results for input(s): "WBC", "HGB", "HCT", "PLT" in the last 72 hours.  No results for input(s): "NA", "K", "CL", "CO2", "GLUCOSE", "BUN", "CREATININE", "CALCIUM" in the last 72 hours.  No intake or output data in the 24 hours ending 10/03/23 1124       Physical Exam: Vital Signs Blood pressure (!) 115/59, pulse 68, temperature 98 F (36.7 C), temperature source Oral, resp. rate 18, height 5' (1.524 m), weight 72.5 kg, SpO2 100%. Gen: no distress, normal appearing. Sitting in bedside wheelchair. HEENT: oral mucosa pink and moist, NCAT Cardio: Bradycardic, regular rhythm. No m/rg/ Chest: normal effort, normal rate of breathing. CTAB Abd: soft, non-distended Ext: no edema Psych: pleasant, normal affect Musculoskeletal:     Cervical back: Neck supple. No tenderness.     Comments: Ue's 5/5 B/L LLE 5-/5  RLE- HF 3-/5; knee 4-/5, otherwise 5-/5 distally   Skin:    General: Skin is warm and dry.     Comments: Severe ecchymoses, purple/yellow bruising from R upper lateral hip to just below R lateral knee - ongoing Medial R knee swelling Incisions no active bleeding- have steristrips- dried blood around incisions - covered in clean dressing 10/12  Intertriginous dermatitis under right breast  Neurological:  AAOx3  No apparent cognitive  deficits  Moving all 4 extremities antigravity and against resistance  Assessment/Plan: 1. Functional deficits which require 3+ hours per day of interdisciplinary therapy in a comprehensive inpatient rehab setting. Physiatrist is providing close team supervision and 24 hour management of active medical problems listed below. Physiatrist and rehab team continue to assess barriers to discharge/monitor patient progress toward functional and medical goals  Care Tool:  Bathing    Body parts bathed by patient: Face, Left upper leg, Right upper leg, Buttocks, Front perineal area, Abdomen, Chest, Left arm, Right arm   Body parts bathed by helper: Right lower leg, Left lower leg     Bathing assist Assist Level: Minimal Assistance - Patient > 75%     Upper Body Dressing/Undressing Upper body dressing   What is the patient wearing?: Bra, Pull over shirt    Upper body assist Assist Level: Set up assist    Lower Body Dressing/Undressing Lower body dressing      What is the patient wearing?: Underwear/pull up, Pants     Lower body assist Assist for lower body dressing: Contact Guard/Touching assist     Toileting Toileting    Toileting assist Assist for toileting: Minimal Assistance - Patient > 75%     Transfers Chair/bed transfer  Transfers assist     Chair/bed transfer assist  level: Contact Guard/Touching assist     Locomotion Ambulation   Ambulation assist      Assist level: Contact Guard/Touching assist Assistive device: Walker-rolling Max distance: 145ft   Walk 10 feet activity   Assist     Assist level: Contact Guard/Touching assist Assistive device: Walker-rolling   Walk 50 feet activity   Assist Walk 50 feet with 2 turns activity did not occur: Safety/medical concerns (fatigue, pain)  Assist level: Contact Guard/Touching assist Assistive device: Walker-rolling    Walk 150 feet activity   Assist Walk 150 feet activity did not occur:  Safety/medical concerns (pain, fatigue)  Assist level: Contact Guard/Touching assist Assistive device: Walker-rolling    Walk 10 feet on uneven surface  activity   Assist Walk 10 feet on uneven surfaces activity did not occur: Safety/medical concerns (fatigue, pain)         Wheelchair     Assist Is the patient using a wheelchair?: Yes Type of Wheelchair: Manual    Wheelchair assist level: Dependent - Patient 0% Max wheelchair distance: >340ft    Wheelchair 50 feet with 2 turns activity    Assist        Assist Level: Dependent - Patient 0%   Wheelchair 150 feet activity     Assist      Assist Level: Dependent - Patient 0%   Blood pressure (!) 115/59, pulse 68, temperature 98 F (36.7 C), temperature source Oral, resp. rate 18, height 5' (1.524 m), weight 72.5 kg, SpO2 100%.  Medical Problem List and Plan: 1. Functional deficits secondary to R hip fracture s/p IM nail fixation- WBAT             -patient may  shower if ocver incisions             -ELOS/Goals: 1-0-12 days mod I to supervision Grounds pass ordered  2.  Antithrombotics: -DVT/anticoagulation:  Pharmaceutical: Lovenox added--will check dopplers to rule out DVT in setting of Fx/immobility             -antiplatelet therapy:  Plavix 3. Pain Management: Will schedule tylenol. Use ice prn.oxycodone prn. Aquathermia- cold ordered prn  - 10/12: Pain poorly controlled; schedule at bedtime Baclofen 5 mg  - 10/13: baclofen improved pain overnight; increase to 5 mg BID. Discussed scheduling oxy vs. Adding tramadol; patient does not wish to change regimen at this time  4. Mood/Behavior/Sleep:              -antipsychotic agents: N/A 5. Neuropsych/cognition: This patient is usually capable of making decisions on her own behalf. 6. Skin/Wound Care: Routine pressure relief measures.  7. Fluids/Electrolytes/Nutrition: Monitor I/O. Check CMET in am 8. Confusion/dizziness: On scoplamine patch likely post  op-->discontinued             --will check orthostatic vitals. Limit narcotics.  9. OSA/Nocturnal hypoxia: Continue Oxygen at nights.  --Pulmonary hygiene during the day 10. CAD s/p stent: Treated medically with DAPT and  Crestor.  11. Thrombocytopenia: Monitor for signs of bleeding.  12. ABLA: Improved post transfusion. Continue to monitor.              --has hx of iron deficiency in the past. Will add iron supplement.  13. Recurrent hypokalemia: Will continue supplement for a few days. Mg WNL 14. HTN/Hx of atrial tachycardia: Continue Cardizem and metoprolol.    15. Anxiety/depression: Continue Effexor XR with xanax at bedtime. Magnesium supplement added after supper  16. Hypotension: decrease metoprolol to 25mg  BID   -  stable, monitor    10/03/2023    5:53 AM 10/03/2023    5:08 AM 10/02/2023    8:00 PM  Vitals with BMI  Weight  159 lbs 13 oz   BMI  31.22   Systolic 115  117  Diastolic 59  48  Pulse 68  75    17. Skin irritation under breast: nystatin powder ordered, continue  18. Hypocalcemia: calcium supplement ordered, continue  19. Hypokalemia: potassium supplement started  20. Constipation: psyllium ordered   - 10/12: LBM 10/7 per chart; patient endorses good Bms yesterday   - LBM 10/12, type 2  21. Intertriginous dermatitis: clotrimazole ordered    LOS: 5 days A FACE TO FACE EVALUATION WAS PERFORMED  Carrie Chung 10/03/2023, 11:24 AM

## 2023-10-04 DIAGNOSIS — S72001S Fracture of unspecified part of neck of right femur, sequela: Secondary | ICD-10-CM | POA: Diagnosis not present

## 2023-10-04 LAB — BASIC METABOLIC PANEL
Anion gap: 6 (ref 5–15)
BUN: 8 mg/dL (ref 8–23)
CO2: 29 mmol/L (ref 22–32)
Calcium: 8.4 mg/dL — ABNORMAL LOW (ref 8.9–10.3)
Chloride: 106 mmol/L (ref 98–111)
Creatinine, Ser: 0.71 mg/dL (ref 0.44–1.00)
GFR, Estimated: >60 mL/min (ref >60)
Glucose, Bld: 107 mg/dL — ABNORMAL HIGH (ref 70–99)
Potassium: 3.9 mmol/L (ref 3.5–5.1)
Sodium: 141 mmol/L (ref 135–145)

## 2023-10-04 LAB — GLUCOSE, CAPILLARY
Glucose-Capillary: 106 mg/dL — ABNORMAL HIGH (ref 70–99)
Glucose-Capillary: 85 mg/dL (ref 70–99)
Glucose-Capillary: 101 mg/dL — ABNORMAL HIGH (ref 70–99)

## 2023-10-04 LAB — CBC
HCT: 30.5 % — ABNORMAL LOW (ref 36.0–46.0)
Hemoglobin: 9.3 g/dL — ABNORMAL LOW (ref 12.0–15.0)
MCH: 28.2 pg (ref 26.0–34.0)
MCHC: 30.5 g/dL (ref 30.0–36.0)
MCV: 92.4 fL (ref 80.0–100.0)
Platelets: 221 10*3/uL (ref 150–400)
RBC: 3.3 MIL/uL — ABNORMAL LOW (ref 3.87–5.11)
RDW: 15.6 % — ABNORMAL HIGH (ref 11.5–15.5)
WBC: 4.5 10*3/uL (ref 4.0–10.5)
nRBC: 0 % (ref 0.0–0.2)

## 2023-10-04 MED ORDER — ENOXAPARIN SODIUM 30 MG/0.3ML IJ SOSY
30.0000 mg | PREFILLED_SYRINGE | INTRAMUSCULAR | Status: DC
  Filled 2023-10-04 (×2): qty 0.3

## 2023-10-04 NOTE — Progress Notes (Signed)
Patient ID: Carrie Chung, female   DOB: 13-May-1941, 82 y.o.   MRN: [id number]  No DME recommendations currently.

## 2023-10-04 NOTE — Progress Notes (Signed)
Occupational Therapy Session Note  Patient Details  Name: Carrie Chung MRN: [id number] Date of Birth: 19-Aug-1941  Today's Date: 10/04/2023 OT Individual Time: 4098-1191 OT Individual Time Calculation (min): 60 min    Short Term Goals: Week 1:  OT Short Term Goal 1 (Week 1): STG = LTG due to ELOS  Skilled Therapeutic Interventions/Progress Updates:    Pt received in w/c ready for therapy.  Pt discussed that she wants to develop her standing endurance and general strength to be more confident with her mobility prior to going home.    Pt able to rise to stand with supervision with cues to push up from w/c arm rest (vs having 1 hand on RW).  She then ambulated with close Supervision to day room. Pt stated her R leg does hurt and cued her to push up through hands on RW when advancing her L leg to avoid having so much weight on her leg. Pt felt this helped.   Once in dayroom, she sat to rest. She then worked on a standing endurance exercise at table - she completed a [facility] activity and tolerated standing for over 9 minutes.  She discussed how she used to like to cook but really has not been able to for 2.5 years after injuring her L leg but she wants to be able to do some tasks. Discussed strategies for meal prep that would be both safe and help her with energy conservation. Pt ambulated back to room with supervision.   Resting in wc with all needs met and alarm set.   Therapy Documentation Precautions:  Precautions Precautions: Fall Restrictions Weight Bearing Restrictions: Yes RLE Weight Bearing: Weight bearing as tolerated    Vital Signs: Therapy Vitals Temp: 97.6 F (36.4 C) Temp Source: Oral Pulse Rate: 72 Resp: 18 BP: 136/62 Patient Position (if appropriate): Lying Oxygen Therapy SpO2: 99 % O2 Device: Nasal Cannula Pain:  6/10 R hip, pt premedicated and took frequent rests    Therapy/Group: Individual Therapy  Kynadee Dam 10/04/2023, 8:31 AM

## 2023-10-04 NOTE — Progress Notes (Signed)
Patient ID: Carrie Chung, female   DOB: 07-17-1941, 82 y.o.   MRN: [id number] Met with the patient to review current situation, rehab schedule, team conference and plan of care. Patient expressed interest in reviewing tips on cooking when standing and adaptive options for the car as she would need to drive and did not have a means of transport other than driving to appointments.  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 Forwarded request for therapy focus to OT. Patient reported left hip fractured a year ago and had just begun to get back into the normal flow. Continue to follow along to address educational needs to facilitate preparation for discharge. Pamelia Hoit

## 2023-10-04 NOTE — Progress Notes (Signed)
Physical Therapy Session Note  Patient Details  Name: Carrie Chung MRN: [id number] Date of Birth: 08-04-1941  Today's Date: 10/04/2023 PT Individual Time: 1345-1458 PT Individual Time Calculation (min): 73 min   Short Term Goals: Week 1:  PT Short Term Goal 1 (Week 1): STG=LTG due to LOS  Skilled Therapeutic Interventions/Progress Updates:   Received pt sitting in WC almost in tears from pain from ted hose on RLE. Stood with RW to remove pants and  L ted hose dependently - noted tourniquet along R thigh and pt reported immediate relief upon removing. Pt may benefit from ace wrapping instead of teds in future sessions. Pt agreeable to PT treatment and reported pain 9/10 in RLE, but steadily decreasing. Session with emphasis on functional mobility/transfers, generalized strengthening and endurance, dynamic standing balance/coordination, stir navigation, and gait training.   Pt performed all transfers with RW and CGA throughout session. Pt  transported to/from room in [facility] dependently for time management purposes. Pt navigated 4 6in steps with bilateral handrails and CGA overall ascending and descending with a step to pattern with good recall for "up with the good, down with the bad technique. Upon stepping down from last step, pt stepping down with wrong leg and RLE got "stuck" on step and required mod A to safely step down.  Pt then ambulated 137ft x 2 trials with RW and CGA/close supervision in dayroom. Pt ambulates at decreased cadence with antalgic gait pattern and decreased R foot clearance, but improved throughout ambulation bouts. Stood on Airex with min/mod HHA and worked on Energy manager with RUE x 2 trials with min A for balance. Returned to room and concluded session with pt sitting in WC, needs within reach, and chair pad alarm on. Ice pack placed on R hip for pain relief.   Therapy Documentation Precautions:  Precautions Precautions:  Fall Restrictions Weight Bearing Restrictions: Yes RLE Weight Bearing: Weight bearing as tolerated  Therapy/Group: Individual Therapy Marlana Salvage Zaunegger Blima Rich PT, DPT 10/04/2023, 6:53 AM

## 2023-10-04 NOTE — Progress Notes (Signed)
Occupational Therapy Session Note  Patient Details  Name: Carrie Chung MRN: [id number] Date of Birth: 01-19-1941  Today's Date: 10/04/2023 OT Individual Time: 4098-1191 OT Individual Time Calculation (min): 70 min    Short Term Goals: Week 1:  OT Short Term Goal 1 (Week 1): STG = LTG due to ELOS  Skilled Therapeutic Interventions/Progress Updates:  Skilled OT intervention completed with focus on ADL retraining, functional endurance, and mobility within a shower context. Pt received upright in bed, agreeable to session. 7/10 pain reported in Rt hip; nurse notified of pain med request however did not provide til end of session. OT offered rest breaks, repositioning, moist heat via shower and ice at end of session for pain relief.  Pt completed all sit > stands with CGA/supervision and ambulatory transfers with close supervision while using RW. Min cues needed for sequencing, fall prevention and body positioning to reduce pain throughout.  Transitioned to EOB with HOB elevated as pt unable to with bed flat, and utilized leg lifter to advance RLE however high pain level and min A needed to EOB. Ambulated > toilet. Able to doff underwear with supervision, then continent of urinary void and BM. Set up A seated pericare. Ambulated to TTB in shower using grab bar for balance.  Waterproof cover applied to IV prior to shower. Pt was able to bathe all parts with intermittent supervision, at the sit > stand level while using grab bar for balance. Utilized long handled sponge to access BLE and periareas as applicable. Did drop items and cues needed for using holder for hand held shower head however pt more familiar with hers at home. CGA transfer using grab bar > w/c at sink. Applied antifungal cream/powder under bilateral breasts per nursing request. Able to donn shirt/deo with set up A.   Upon removal of soiled Rt hip bandages, 1 noted to be actively bleeding with heavy flow. Nurse notified and in  room for wound care. Donned thigh TED to RLE for edema management, then for time threaded LB clothing with total A at the sit > stand level with CGA. Donned shoes with total A for time.  Pt remained seated in w/c with direct care hand off to nursing with all immediate needs met at end of session.   Therapy Documentation Precautions:  Precautions Precautions: Fall Restrictions Weight Bearing Restrictions: Yes RLE Weight Bearing: Weight bearing as tolerated    Therapy/Group: Individual Therapy  Melvyn Novas, MS, OTR/L  10/04/2023, 12:16 PM

## 2023-10-04 NOTE — Progress Notes (Signed)
PROGRESS NOTE   Subjective/Complaints: She continues to have right hip pain, was 7/10 last night, oxycodone does help, she is also concerned by extensive bruising of her leg  Vitals stable; no further bradycardia.  Last BM 10/12  ROS: +pain at site of hip fracture, right hip- continues Positives per HPI above. Denies fevers, chills, N/V, abdominal pain, SOB, chest pain, new weakness or paraesthesias.  +extensive of bruising of right leg   Objective:   No results found. Recent Labs    10/04/23 0600  WBC 4.5  HGB 9.3*  HCT 30.5*  PLT 221    Recent Labs    10/04/23 0600  NA 141  K 3.9  CL 106  CO2 29  GLUCOSE 107*  BUN 8  CREATININE 0.71  CALCIUM 8.4*     Intake/Output Summary (Last 24 hours) at 10/04/2023 0954 Last data filed at 10/04/2023 0757 Gross per 24 hour  Intake 714 ml  Output --  Net 714 ml         Physical Exam: Vital Signs Blood pressure 136/62, pulse 72, temperature 97.6 F (36.4 C), temperature source Oral, resp. rate 18, height 5' (1.524 m), weight 72.8 kg, SpO2 99%. Gen: no distress, normal appearing. Sitting in bedside wheelchair. HEENT: oral mucosa pink and moist, NCAT Cardio: Bradycardic, regular rhythm. No m/rg/ Chest: normal effort, normal rate of breathing. CTAB Abd: soft, non-distended Ext: no edema Psych: pleasant, normal affect Musculoskeletal:     Cervical back: Neck supple. No tenderness.     Comments: Ue's 5/5 B/L LLE 5-/5  RLE- HF 3-/5; knee 4-/5, otherwise 5-/5 distally   Skin:    General: Skin is warm and dry.     Comments: Severe ecchymoses, purple/yellow bruising from R upper lateral hip to just below R lateral knee - ingoing 10/14, 4/5 strength in RLE limited by pain Medial R knee swelling Incisions no active bleeding- have steristrips- dried blood around incisions - covered in clean dressing 10/12  Intertriginous dermatitis under right breast  Neurological:   AAOx3  No apparent cognitive deficits  Moving all 4 extremities antigravity and against resistance  Assessment/Plan: 1. Functional deficits which require 3+ hours per day of interdisciplinary therapy in a comprehensive inpatient rehab setting. Physiatrist is providing close team supervision and 24 hour management of active medical problems listed below. Physiatrist and rehab team continue to assess barriers to discharge/monitor patient progress toward functional and medical goals  Care Tool:  Bathing    Body parts bathed by patient: Face, Left upper leg, Right upper leg, Buttocks, Front perineal area, Abdomen, Chest, Left arm, Right arm, Right lower leg, Left lower leg   Body parts bathed by helper: Right lower leg, Left lower leg     Bathing assist Assist Level: Supervision/Verbal cueing     Upper Body Dressing/Undressing Upper body dressing   What is the patient wearing?: Bra, Pull over shirt    Upper body assist Assist Level: Set up assist    Lower Body Dressing/Undressing Lower body dressing      What is the patient wearing?: Underwear/pull up, Pants     Lower body assist Assist for lower body dressing: Contact Guard/Touching assist  Toileting Toileting    Toileting assist Assist for toileting: Supervision/Verbal cueing     Transfers Chair/bed transfer  Transfers assist     Chair/bed transfer assist level: Contact Guard/Touching assist     Locomotion Ambulation   Ambulation assist      Assist level: Contact Guard/Touching assist Assistive device: Walker-rolling Max distance: 149ft   Walk 10 feet activity   Assist     Assist level: Contact Guard/Touching assist Assistive device: Walker-rolling   Walk 50 feet activity   Assist Walk 50 feet with 2 turns activity did not occur: Safety/medical concerns (fatigue, pain)  Assist level: Contact Guard/Touching assist Assistive device: Walker-rolling    Walk 150 feet activity   Assist  Walk 150 feet activity did not occur: Safety/medical concerns (pain, fatigue)  Assist level: Contact Guard/Touching assist Assistive device: Walker-rolling    Walk 10 feet on uneven surface  activity   Assist Walk 10 feet on uneven surfaces activity did not occur: Safety/medical concerns (fatigue, pain)         Wheelchair     Assist Is the patient using a wheelchair?: Yes Type of Wheelchair: Manual    Wheelchair assist level: Dependent - Patient 0% Max wheelchair distance: >363ft    Wheelchair 50 feet with 2 turns activity    Assist        Assist Level: Dependent - Patient 0%   Wheelchair 150 feet activity     Assist      Assist Level: Dependent - Patient 0%   Blood pressure 136/62, pulse 72, temperature 97.6 F (36.4 C), temperature source Oral, resp. rate 18, height 5' (1.524 m), weight 72.8 kg, SpO2 99%.  Medical Problem List and Plan: 1. Functional deficits secondary to R hip fracture s/p IM nail fixation- WBAT             -patient may  shower if ocver incisions             -ELOS/Goals: 1-0-12 days mod I to supervision Grounds pass ordered  2.  Impaired mobility: Lovenox decreased to 30 given extensive bruising, SCDs ordered             -antiplatelet therapy:  Plavix 3. Pain Management: Will schedule tylenol. Use ice prn.oxycodone prn. Aquathermia- cold ordered prn  - 10/12: Pain poorly controlled; schedule at bedtime Baclofen 5 mg  - 10/13: baclofen improved pain overnight; increase to 5 mg BID. Discussed scheduling oxy vs. Adding tramadol; patient does not wish to change regimen at this time  -discussed that oxycodone is helpful  4. Mood/Behavior/Sleep:              -antipsychotic agents: N/A 5. Neuropsych/cognition: This patient is usually capable of making decisions on her own behalf. 6. Extensive bruising of right lower extremity: decrease Lovenox to 30mg  7. Fluids/Electrolytes/Nutrition: Monitor I/O. Check CMET in am 8.  Confusion/dizziness: On scoplamine patch likely post op-->discontinued             --will check orthostatic vitals. Limit narcotics.  9. OSA/Nocturnal hypoxia: Continue Oxygen at nights.  --Pulmonary hygiene during the day 10. CAD s/p stent: Treated medically with DAPT and  Crestor.  11. Thrombocytopenia: Monitor for signs of bleeding.  12. ABLA: Improved post transfusion. Continue to monitor.              --has hx of iron deficiency in the past. Will add iron supplement.  13. Recurrent hypokalemia: Will continue supplement for a few days. Mg WNL 14. HTN/Hx of atrial tachycardia:  Continue Cardizem and metoprolol.    15. Anxiety/depression: Continue Effexor XR with xanax at bedtime. Magnesium supplement added after supper  16. Hypotension: decrease metoprolol to 25mg  BID   10/14 BP reviewed and has normalized, continue this dose of metoprolol    10/04/2023    5:00 AM 10/04/2023    4:54 AM 10/03/2023    7:47 PM  Vitals with BMI  Weight 160 lbs 8 oz    BMI 31.34    Systolic  136 118  Diastolic  62 52  Pulse  72 82    17. Skin irritation under breast: nystatin powder ordered, continue  18. Hypocalcemia: calcium supplement ordered, continue  19. Hypokalemia: potassium supplement started  20. Constipation: psyllium ordered   - 10/12: LBM 10/7 per chart; patient endorses good Bms yesterday   - LBM 10/13  21. Intertriginous dermatitis: clotrimazole ordered    LOS: 6 days A FACE TO FACE EVALUATION WAS PERFORMED  Drema Pry Cabell Lazenby 10/04/2023, 9:54 AM

## 2023-10-05 LAB — GLUCOSE, CAPILLARY
Glucose-Capillary: 108 mg/dL — ABNORMAL HIGH (ref 70–99)
Glucose-Capillary: 87 mg/dL (ref 70–99)
Glucose-Capillary: 96 mg/dL (ref 70–99)
Glucose-Capillary: 111 mg/dL — ABNORMAL HIGH (ref 70–99)

## 2023-10-05 NOTE — Progress Notes (Signed)
PROGRESS NOTE   Subjective/Complaints: Continues to have pain at night- does not want changes in her sleeping medication or pain regimen at this time Has right hip hematoma   ROS: +pain at site of hip fracture, right hip- continues Positives per HPI above. Denies fevers, chills, N/V, abdominal pain, SOB, chest pain, new weakness or paraesthesias.  +extensive of bruising of right leg, +right hip hematoma   Objective:   No results found. Recent Labs    10/04/23 0600  WBC 4.5  HGB 9.3*  HCT 30.5*  PLT 221    Recent Labs    10/04/23 0600  NA 141  K 3.9  CL 106  CO2 29  GLUCOSE 107*  BUN 8  CREATININE 0.71  CALCIUM 8.4*     Intake/Output Summary (Last 24 hours) at 10/05/2023 1133 Last data filed at 10/05/2023 0900 Gross per 24 hour  Intake 940 ml  Output --  Net 940 ml         Physical Exam: Vital Signs Blood pressure (!) 121/53, pulse 77, temperature 98.2 F (36.8 C), temperature source Oral, resp. rate 16, height 5' (1.524 m), weight 72.8 kg, SpO2 99%. Gen: no distress, normal appearing. Sitting in bedside wheelchair. HEENT: oral mucosa pink and moist, NCAT Cardio: Bradycardic, regular rhythm. No m/rg/ Chest: normal effort, normal rate of breathing. CTAB Abd: soft, non-distended Ext: no edema Psych: pleasant, normal affect Musculoskeletal:     Cervical back: Neck supple. No tenderness.     Comments: Ue's 5/5 B/L LLE 5-/5  RLE- HF 3-/5; knee 4-/5, otherwise 5-/5 distally +right hip hematoma   Skin:    General: Skin is warm and dry.     Comments: Severe ecchymoses, purple/yellow bruising from R upper lateral hip to just below R lateral knee, 4/5 strength in RLE limited by pain Medial R knee swelling Incisions no active bleeding- have steristrips- dried blood around incisions - covered in clean dressing 10/12  Intertriginous dermatitis under right breast  Neurological:  AAOx3  No apparent  cognitive deficits  Moving all 4 extremities antigravity and against resistance  Assessment/Plan: 1. Functional deficits which require 3+ hours per day of interdisciplinary therapy in a comprehensive inpatient rehab setting. Physiatrist is providing close team supervision and 24 hour management of active medical problems listed below. Physiatrist and rehab team continue to assess barriers to discharge/monitor patient progress toward functional and medical goals  Care Tool:  Bathing    Body parts bathed by patient: Face, Left upper leg, Right upper leg, Buttocks, Front perineal area, Abdomen, Chest, Left arm, Right arm, Right lower leg, Left lower leg   Body parts bathed by helper: Right lower leg, Left lower leg     Bathing assist Assist Level: Supervision/Verbal cueing     Upper Body Dressing/Undressing Upper body dressing   What is the patient wearing?: Bra, Pull over shirt    Upper body assist Assist Level: Set up assist    Lower Body Dressing/Undressing Lower body dressing      What is the patient wearing?: Underwear/pull up, Pants     Lower body assist Assist for lower body dressing: Contact Guard/Touching assist     Toileting Toileting  Toileting assist Assist for toileting: Supervision/Verbal cueing     Transfers Chair/bed transfer  Transfers assist     Chair/bed transfer assist level: Contact Guard/Touching assist     Locomotion Ambulation   Ambulation assist      Assist level: Contact Guard/Touching assist Assistive device: Walker-rolling Max distance: 132ft   Walk 10 feet activity   Assist     Assist level: Contact Guard/Touching assist Assistive device: Walker-rolling   Walk 50 feet activity   Assist Walk 50 feet with 2 turns activity did not occur: Safety/medical concerns (fatigue, pain)  Assist level: Contact Guard/Touching assist Assistive device: Walker-rolling    Walk 150 feet activity   Assist Walk 150 feet  activity did not occur: Safety/medical concerns (pain, fatigue)  Assist level: Contact Guard/Touching assist Assistive device: Walker-rolling    Walk 10 feet on uneven surface  activity   Assist Walk 10 feet on uneven surfaces activity did not occur: Safety/medical concerns (fatigue, pain)         Wheelchair     Assist Is the patient using a wheelchair?: Yes Type of Wheelchair: Manual    Wheelchair assist level: Dependent - Patient 0% Max wheelchair distance: >321ft    Wheelchair 50 feet with 2 turns activity    Assist        Assist Level: Dependent - Patient 0%   Wheelchair 150 feet activity     Assist      Assist Level: Dependent - Patient 0%   Blood pressure (!) 121/53, pulse 77, temperature 98.2 F (36.8 C), temperature source Oral, resp. rate 16, height 5' (1.524 m), weight 72.8 kg, SpO2 99%.  Medical Problem List and Plan: 1. Functional deficits secondary to R hip fracture s/p IM nail fixation- WBAT             -patient may  shower if ocver incisions             -ELOS/Goals: 1-0-12 days mod I to supervision Grounds pass ordered  2. Right hip hematoma: d/c lovenx=ox, SCDs ordered             -antiplatelet therapy:  Plavix 3. Pain Management: Will schedule tylenol. Use ice prn.oxycodone prn. Aquathermia- cold ordered prn  - 10/12: Pain poorly controlled; schedule at bedtime Baclofen 5 mg  - 10/13: baclofen improved pain overnight; increase to 5 mg BID. Discussed scheduling oxy vs. Adding tramadol; patient does not wish to change regimen at this time  -discussed that oxycodone is helpful, continue current regimen  4. Insomnia: continue Xanax  5. Neuropsych/cognition: This patient is usually capable of making decisions on her own behalf. 6. Extensive bruising of right lower extremity: decrease Lovenox to 30mg  7. Fluids/Electrolytes/Nutrition: Monitor I/O. Check CMET in am 8. Confusion/dizziness: On scoplamine patch likely post  op-->discontinued             --will check orthostatic vitals. Limit narcotics.  9. OSA/Nocturnal hypoxia: Continue Oxygen at nights.  --Pulmonary hygiene during the day 10. CAD s/p stent: Treated medically with DAPT and  Crestor.  11. Thrombocytopenia: Monitor for signs of bleeding.  12. ABLA: Improved post transfusion. Continue to monitor.              --has hx of iron deficiency in the past. Will add iron supplement.  13. Recurrent hypokalemia: Will continue supplement for a few days. Mg WNL 14. HTN/Hx of atrial tachycardia: Continue Cardizem and metoprolol.    15. Anxiety/depression: Continue Effexor XR with xanax at bedtime. Magnesium supplement added  after supper  16. Hypotension: decrease metoprolol to 25mg  BID   10/14 BP reviewed and has normalized, continue this dose of metoprolol    10/05/2023    4:20 AM 10/04/2023    8:07 PM 10/04/2023    3:29 PM  Vitals with BMI  Systolic 121 130 956  Diastolic 53 63 74  Pulse 77 72 78    17. Skin irritation under breast: nystatin powder ordered, continue  18. Hypocalcemia: calcium supplement ordered, continue  19. Hypokalemia: potassium supplement started  20. Constipation: psyllium ordered   - 10/12: LBM 10/7 per chart; patient endorses good Bms yesterday   - LBM 10/13  21. Intertriginous dermatitis: clotrimazole ordered, contnue    LOS: 7 days A FACE TO FACE EVALUATION WAS PERFORMED  Drema Pry Jadea Shiffer 10/05/2023, 11:33 AM

## 2023-10-05 NOTE — Progress Notes (Signed)
Physical Therapy Session Note  Patient Details  Name: Carrie Chung MRN: [id number] Date of Birth: June 19, 1941  Today's Date: 10/05/2023 PT Individual Time: 6269-4854 PT Individual Time Calculation (min): 42 min   Short Term Goals: Week 1:  PT Short Term Goal 1 (Week 1): STG=LTG due to LOS  Skilled Therapeutic Interventions/Progress Updates:      Pt sitting in manual w/c to start - agreeable to PT tx. Reports 6/10 R hip pain - repositioning, mobility, and distraction provided for pain management.   Transported to main rehab gym for time. Completed stand pivot transfer from w/c to mat table with supervision using the RW.   Pt completed sit>supine onto mat table with large wedge behind her (reports she has a wedge + pillows at home that she uses in her bed) with supervision assist. Pt "hooking" her RLE with LLE and cues for sliding hips posteriorly allowed her adequate clearance to lift both LE's onto the table. Pt needed ++ time but capable. She then repeated this same process for returning to sitting position with similar ability and cues. Pt reports that this "technique" won't work for her bed at home and she's anticipate that she will require assist. Pt reports her bed is higher and "softer" and more difficult to move in due to the linen.   Pt completed mat table there-ex to help carryover into functional bed mobility -2x10 hip abd/add on R -2x10 heels slides in available ROM on R -2x10 modified bridges (limited knee flexion) -2x15 heel slides on therapy ball with AAROM for tolerable end range.  -2x10 quad sets on R *rest breaks as needed   Pt returned to her room at end of treatment. Fresh ice provided for refillable ice pack. All needs met at end of session.    Therapy Documentation Precautions:  Precautions Precautions: Fall Restrictions Weight Bearing Restrictions: Yes RLE Weight Bearing: Weight bearing as tolerated General:   Therapy/Group: Individual  Therapy  Carrie Chung PT 10/05/2023, 7:47 AM

## 2023-10-05 NOTE — Progress Notes (Signed)
Patient ID: Carrie Chung, female   DOB: 06/12/1941, 82 y.o.   MRN: [id number]  Neuro Rehab referral sent.

## 2023-10-05 NOTE — Progress Notes (Addendum)
Patient ID: Carrie Chung, female   DOB: 1941-07-03, 82 y.o.   MRN: [id number]  Patient d/c updated to 10/19.

## 2023-10-05 NOTE — Progress Notes (Signed)
Physical Therapy Session Note  Patient Details  Name: Carrie Chung MRN: [id number] Date of Birth: December 24, 1940  Today's Date: 10/05/2023 PT Individual Time: 4098-1191 and 1300-1355 PT Individual Time Calculation (min): 56 min and 55 min  Short Term Goals: Week 1:  PT Short Term Goal 1 (Week 1): STG=LTG due to LOS  Skilled Therapeutic Interventions/Progress Updates:   Treatment Session 1 Received pt semi-reclined in bed, reporting having a "bad night" from R hip pain and noise. Pt agreeable to PT treatment and reported pain 7/10 in R hip - RN notified and present to administer medications. Session with emphasis on functional mobility/transfers, dressing, toileting, generalized strengthening and endurance, and ambulation. Pt transferred semi-reclined<>sitting L EOB with HOB elevated and mod A for trunk control this morning due to being "tired". Pt performed all transfers with RW and CGA/close supervision throughout session.   Pt ambulated in/out of bathroom with RW and CGA. Removed gown and brief and pt continent of bowel and bladder and performed hygiene management without assist. Donned underwear and pants with max A to thread LEs through and stood with RW and CGA to pull over hips. Donned bra and pull over shirt with set up assist and required mod A to slip R foot into shoe. Pt sat in WC at sink and brushed teeth, combed hair, and washed face with setup assist.   MD arrived for morning rounds and pt stood for MD to inspect incisions - noted mild drainage from most distal incision but no pus or odor and hematoma on proximal aspect of hip. Discussed D/C date of Thursday and pt hesitant that she may not be ready - biggest barriers are bed mobility and curb navigation. Plan to practice bed mobility with next PT and then practice navigating 1 step with RW this afternoon to simulate entrance from living room to kitchen. Pt performed x20 RLE active assisted towel slides with emphasis on R knee  flexion ROM. Concluded session with pt sitting in WC, needs within reach, and chair pad alarm on. Provided pt with ice pack for R hip.   Treatment Session 2 Received pt sitting in [facility] on phone with daughter. Pt agreeable to PT treatment and reported pain 6/10 in R hip (premedicated). Session with emphasis on functional mobility/transfers, toileting, generalized strengthening and endurance, and curb navigation. Pt transported to/from room in [facility] dependently for time management purposes. Pt performed all transfers with RW and CGA/close supervision throughout session.   Pt navigated 1 5in curb x 2 trials with RW and CGA x 2 trials with min cues for "up with the good, down with the bad" technique to simulate step from living room into kitchen. Pt verbalized being concerned with bed mobility and reports her bed at home is 27in high - prepared to practice bed mobility in gym, however when pt stood noted pants soiled with urine. Returned to room and ambulated in/out of bathroom with RW and CGA/close supervision. Removed soiled clothing and pt continued to void. Performed pericare without assist and donned clean underwear and pants with assist to thread LEs through but able to stand and pull over hips with close supervision. Pt stood at sink and performed hand hygiene with supervision. Discussed with treatment team extending D/C date until Saturday as pt does not feel she will be ready by Thursday. Concluded session with pt sitting in [facility] with all needs within reach. Provided pt with ice pack for R hip.   Therapy Documentation Precautions:  Precautions Precautions: Fall Restrictions Weight Bearing  Restrictions: Yes RLE Weight Bearing: Weight bearing as tolerated  Therapy/Group: Individual Therapy Marlana Salvage Zaunegger Blima Rich PT, DPT 10/05/2023, 6:51 AM

## 2023-10-05 NOTE — Progress Notes (Signed)
Occupational Therapy Session Note  Patient Details  Name: Carrie Chung MRN: [id number] Date of Birth: 09/24/1941  Today's Date: 10/05/2023 OT Individual Time: 1020-1100 OT Individual Time Calculation (min): 40 min    Short Term Goals: Week 1:  OT Short Term Goal 1 (Week 1): STG = LTG due to ELOS  Skilled Therapeutic Interventions/Progress Updates:  Skilled OT intervention completed with focus on DC planning, tub/transfer education, functional transfers, BUE exercises. Pt received seated in w/c, agreeable to session. No pain reported.   Transported dependently in w/c for time <> ADL bathroom.   Pt reports bathroom set up as tub/shower with curtain. Pt has TTB with sliding seat at baseline for tub access. Advised use of hand held shower head for ease of bathing. Demonstrated method of tucking shower curtain under buttocks to prevent water spillage in floor. Discussed using lateral leans for peri-washing and use/installation of grab bars for balance. Min A needed to stand with RW then ambulated with CGA > TTB. Pt was able to sit and pivot into tub on TTB with supervision however mod cues needed for technique and use of personal leg lifter for RLE management. Able to stand with use of grab bar (planned to be installed) with supervision. Similar technique to exit and supervision ambulation back to w/c.  Back in room, pt completed the following BUE exercises to promote BUE strength/endurance needed for independence with functional transfers and BADLs: (With yellow theraband) 2x10 reps Horizontal abduction Self-anchored bicep flexion each arm Shoulder external rotation  Pt remained seated in w/c, with chair alarm on/activated, ice on Rt hip and with all needs in reach at end of session.   Therapy Documentation Precautions:  Precautions Precautions: Fall Restrictions Weight Bearing Restrictions: Yes RLE Weight Bearing: Weight bearing as tolerated    Therapy/Group: Individual  Therapy  Davell Beckstead E Livie Vanderhoof, MS, OTR/L  10/05/2023, 11:05 AM

## 2023-10-06 LAB — GLUCOSE, CAPILLARY: Glucose-Capillary: 135 mg/dL — ABNORMAL HIGH (ref 70–99)

## 2023-10-06 NOTE — Progress Notes (Signed)
Physical Therapy Session Note  Patient Details  Name: Carrie Chung MRN: [id number] Date of Birth: 08-Aug-1941  Today's Date: 10/06/2023 PT Individual Time: 5284-1324 PT Individual Time Calculation (min): 69 min   Short Term Goals: Week 1:  PT Short Term Goal 1 (Week 1): STG=LTG due to LOS Week 2:     Skilled Therapeutic Interventions/Progress Updates:  Patient seated upright in recliner on entrance to room. Patient alert and agreeable to PT session. Husband and dtr in room for visit.   Patient with continued mid level pain complaint at start of session. Relates as tolerable and will push self to pain limit.   Therapeutic Activity: Transfers: Pt performed sit<>stand and stand pivot transfers throughout session with supervision and intermittent need for light MinA at power up with either rise to stand following long time in sitting or from lower seat heights. Provided vc at start of session for RLE positioning ahead of body position for improved pain relief from edema/ joint swelling.  Gait Training:  Pt ambulated to/from room and ambulating extra distance around nurse's station to get to day room. Completes with close supervision. Pt initially with standard RW and relates that she has a "jr" walker at home. Youth walker retrieved and provided with slightly lower height with pt relating tha positioning is better. Pt with decreased elbow angle and improved UE support with WB to RLE.   Set up weighted ball retrieval exercise from around the room in order to assess safety awareness and mobility as pt will need to get things for herself about the downstairs of home. Provided close supervision throughout and retrieves weighted balls from below knee height to overhead with no difficulty and safe approaches. Guided in walk/ talk with maintaining focus to ambulation and decreasing UE pressure into RW to help with walker progression. Still requires pause of walker during increased UE support with  WB to RLE but improved progression noted. Provided vc/ tc for attempt to increase WB for improved LLE step length progression.  Therapeutic Exercise: Pt performed the following exercises with vc/ tc for proper technique. And recommendation for performance while seated or standing. Standing heel raises x10 Standing hip flexion with focus on RLE x10, seated iso 10sec holds Seated/ supine ankle pumps performed seated x10 and circles x10 Seated quad sets into initiation of LAQ to pt's AROM limit. X10  Instructed in heel slides in bed and straight leg abd/ add for improved hip mobility. Educated on recommendation for x10 heel raises prior to sitting after toileting with nursing for improved circulation and assist with edema/ swelling.   Patient seated upright in recliner with BLE elevated at end of session with brakes locked, seat pad alarm set, and all needs within reach. Pt appreciative of time spent talking and relating life experiences as she has had limited socialization since having to care for husband with dementia.    Therapy Documentation Precautions:  Precautions Precautions: Fall Restrictions Weight Bearing Restrictions: Yes RLE Weight Bearing: Weight bearing as tolerated  Pain: Pt relates 5-6/10 pain consistently and then increasing to 7-8/10 with WB and time in stance. However, pt does not complain of pain during session and appears to tolerate current level well.   Therapy/Group: Individual Therapy  Loel Dubonnet PT, DPT, CSRS 10/06/2023, 5:31 PM

## 2023-10-06 NOTE — Progress Notes (Signed)
Physical Therapy Session Note  Patient Details  Name: Carrie Chung MRN: [id number] Date of Birth: 1941/03/28  Today's Date: 10/06/2023 PT Individual Time: 4098-1191 PT Individual Time Calculation (min): 55 min   Short Term Goals: Week 1:  PT Short Term Goal 1 (Week 1): STG=LTG due to LOS  Skilled Therapeutic Interventions/Progress Updates:   Received pt sitting in WC, pt agreeable to PT treatment, and reported pain 4/10 in R hip (premedicated). Session with emphasis on functional mobility/transfers, bed mobility, global strengthening and endurance, and gait training. Pt performed all transfers with RW and close supervision throughout session - min cues to kick RLE out prior to sitting to reduce pain. Set pt's bed up to simulate bed at home - 27in high with 27 degree wedge. Pt ambulated to other side of bed (gets in on R side) and performed bed mobility x 2 trials with supervision and increased time/effort using leg lifter but no physical assist required - cues to pay attention to how close to the edge of the bed she is. Discussed keeping laundry basket with multiple outfits by the bed to conserve energy when dressing daily.   Pt then ambulated 188ft with RW and close supervision to dayroom. Pt demonstrating more fluid gait mechanics with step through rather than step to gait pattern. Pt reports another concern of hers is standing for prolonged periods of time to wash dishes/cook at home. Discussed energy conservation strategies and encouraged pt to work on standing tolerance in remainder of PT sessions today. Transported back to room and ambulated 31ft with RW and supervision to recliner. Concluded session with pt sitting in recliner, needs within reach, and chair pad alarm on. Provided ice pain for R hip for pain relief.   Therapy Documentation Precautions:  Precautions Precautions: Fall Restrictions Weight Bearing Restrictions: Yes RLE Weight Bearing: Weight bearing as  tolerated  Therapy/Group: Individual Therapy Marlana Salvage Zaunegger Blima Rich PT, DPT 10/06/2023, 6:53 AM

## 2023-10-06 NOTE — Patient Care Conference (Signed)
Inpatient RehabilitationTeam Conference and Plan of Care Update Date: 10/06/2023   Time: 11:45 AM    Patient Name: Carrie Chung      Medical Record Number: [id number]  Date of Birth: 15-Jan-1941 Sex: Female         Room/Bed: 4W26C/4W26C-01 Payor Info: Payor: MEDICARE / Plan: MEDICARE PART A AND B / Product Type: *No Product type* /    Admit Date/Time:  09/28/2023  1:34 PM  Primary Diagnosis:  Closed right hip fracture, sequela  Hospital Problems: Principal Problem:   Closed right hip fracture, sequela    Expected Discharge Date: Expected Discharge Date: 10/09/23  Team Members Present: Physician leading conference: Dr. Sula Soda Social Worker Present: Lavera Guise, BSW Nurse Present: Chana Bode, RN PT Present: Raechel Chute, PT OT Present: Roney Mans, OT SLP Present: Jeannie Done, SLP PPS Coordinator present : Fae Pippin, SLP     Current Status/Progress Goal Weekly Team Focus  Bowel/Bladder   Pt is continent of bowel/bladder   Pt will remain continent of bowel/bladder   Will continue to assist with toileting q4hrs    Swallow/Nutrition/ Hydration               ADL's   Indep UB, Min A Lb, Supervision toileting   Mod I   Barriers- pain, LB care, endurance    Mobility   bed mobility supervision using bed features, transfers with RW CGA, gait 156ft with RW CGA, 4 6in steps with 2 handrails CGA, WC mobility 131ft supervision   Mod I  barriers: pain, decreased balance/coordination, and global deconditioning    Communication                Safety/Cognition/ Behavioral Observations               Pain   Pt c/o incision site pain   Pt's pain is controlled with medication   Will assess qshift and PRN    Skin   Pt has bruising on the right hip, inside right thigh, and abdomen   Pt's bruising will deminish  Will assess qshift and PRN      Discharge Planning:  Discharge moved to 10/19/ Patient discharging home with caregivers.  No barriers.   Team Discussion: Patient with hypotension post right hip fracture.   Patient on target to meet rehab goals: yes, currently needs min assist - supervision for bed mobility using a leg lifter and CGA for transfers and supervision for transfers. Needs CGA-supervision to ambulate up to 180' using a RW and 4 steps with  bil. rails with CGA.  Goals for discharge set for mod I overall.  *See Care Plan and progress notes for long and short-term goals.   Revisions to Treatment Plan:  N/a   Teaching Needs: Safety, medications, transfers, toileting, skin care, etc.   Current Barriers to Discharge: Decreased caregiver support and Weight bearing restrictions  Possible Resolutions to Barriers: OP follow up services, has all DME     Medical Summary Current Status: insomnia, pain, hematoma, hypotension, severe hypokalemia, obesity, hypocalcemia, anixety  Barriers to Discharge: Medical stability  Barriers to Discharge Comments: insomnia, pain, hematoma, hypotension, severe hypokalemia, obesity, hypocalcemia, anixety Possible Resolutions to [employer] Focus: discussed sleep medications but she defers at this time, continue Xanax, d/c Lovenox, optimized magnesium with supplementation, decreased metoprolol   Continued Need for Acute Rehabilitation Level of Care: The patient requires daily medical management by a physician with specialized training in physical medicine and rehabilitation for the following reasons: Direction of  a multidisciplinary physical rehabilitation program to maximize functional independence : Yes Medical management of patient stability for increased activity during participation in an intensive rehabilitation regime.: Yes Analysis of laboratory values and/or radiology reports with any subsequent need for medication adjustment and/or medical intervention. : Yes   I attest that I was present, lead the team conference, and concur with the assessment and plan of  the team.   Chana Bode B 10/06/2023, 3:32 PM

## 2023-10-06 NOTE — Progress Notes (Signed)
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 Denies fevers, chills, N/V, abdominal pain, SOB, chest pain, new weakness or paraesthesias.  +extensive of bruising of right leg, +right hip hematoma- improved   Objective:   No results found. Recent Labs    10/04/23 0600  WBC 4.5  HGB 9.3*  HCT 30.5*  PLT 221    Recent Labs    10/04/23 0600  NA 141  K 3.9  CL 106  CO2 29  GLUCOSE 107*  BUN 8  CREATININE 0.71  CALCIUM 8.4*     Intake/Output Summary (Last 24 hours) at 10/06/2023 0912 Last data filed at 10/06/2023 0802 Gross per 24 hour  Intake 680 ml  Output --  Net 680 ml         Physical Exam: Vital Signs Blood pressure (!) 115/53, pulse 71, temperature 98 F (36.7 C), resp. rate 18, height 5' (1.524 m), weight 72.8 kg, SpO2 98%. Gen: no distress, normal appearing. Sitting in bedside wheelchair. HEENT: oral mucosa pink and moist, NCAT Cardio: Bradycardic, regular rhythm. No m/rg/ Chest: normal effort, normal rate of breathing. CTAB Abd: soft, non-distended Ext: no edema Psych: pleasant, normal affect Musculoskeletal:     Cervical back: Neck supple. No tenderness.     Comments: Ue's 5/5 B/L LLE 5-/5  RLE- HF 3-/5; knee 4-/5, otherwise 5-/5 distally +right hip hematoma- improved   Skin:    General: Skin is warm and dry.     Comments: Severe ecchymoses, purple/yellow bruising from R upper lateral hip to just below R lateral knee, 4/5 strength in RLE limited by pain Medial R knee swelling Incisions no active bleeding- have steristrips- dried blood around incisions - covered in clean dressing stable 10/16  Intertriginous dermatitis under right breast  Neurological:  AAOx3  No apparent cognitive deficits   Moving all 4 extremities antigravity and against resistance  Assessment/Plan: 1. Functional deficits which require 3+ hours per day of interdisciplinary therapy in a comprehensive inpatient rehab setting. Physiatrist is providing close team supervision and 24 hour management of active medical problems listed below. Physiatrist and rehab team continue to assess barriers to discharge/monitor patient progress toward functional and medical goals  Care Tool:  Bathing    Body parts bathed by patient: Face, Left upper leg, Right upper leg, Buttocks, Front perineal area, Abdomen, Chest, Left arm, Right arm, Right lower leg, Left lower leg   Body parts bathed by helper: Right lower leg, Left lower leg     Bathing assist Assist Level: Supervision/Verbal cueing     Upper Body Dressing/Undressing Upper body dressing   What is the patient wearing?: Bra, Pull over shirt    Upper body assist Assist Level: Set up assist    Lower Body Dressing/Undressing Lower body dressing      What is the patient wearing?: Underwear/pull up, Pants     Lower body assist Assist for lower body dressing: Contact Guard/Touching assist     Toileting Toileting    Toileting assist Assist  for toileting: Supervision/Verbal cueing     Transfers Chair/bed transfer  Transfers assist     Chair/bed transfer assist level: Contact Guard/Touching assist     Locomotion Ambulation   Ambulation assist      Assist level: Contact Guard/Touching assist Assistive device: Walker-rolling Max distance: 164ft   Walk 10 feet activity   Assist     Assist level: Contact Guard/Touching assist Assistive device: Walker-rolling   Walk 50 feet activity   Assist Walk 50 feet with 2 turns activity did not occur: Safety/medical concerns (fatigue, pain)  Assist level: Contact Guard/Touching assist Assistive device: Walker-rolling    Walk 150 feet activity   Assist Walk 150 feet activity did not occur:  Safety/medical concerns (pain, fatigue)  Assist level: Contact Guard/Touching assist Assistive device: Walker-rolling    Walk 10 feet on uneven surface  activity   Assist Walk 10 feet on uneven surfaces activity did not occur: Safety/medical concerns (fatigue, pain)         Wheelchair     Assist Is the patient using a wheelchair?: Yes Type of Wheelchair: Manual    Wheelchair assist level: Dependent - Patient 0% Max wheelchair distance: >371ft    Wheelchair 50 feet with 2 turns activity    Assist        Assist Level: Dependent - Patient 0%   Wheelchair 150 feet activity     Assist      Assist Level: Dependent - Patient 0%   Blood pressure (!) 115/53, pulse 71, temperature 98 F (36.7 C), resp. rate 18, height 5' (1.524 m), weight 72.8 kg, SpO2 98%.  Medical Problem List and Plan: 1. Functional deficits secondary to R hip fracture s/p IM nail fixation- WBAT             -patient may  shower if ocver incisions             -ELOS/Goals: 1-0-12 days mod I to supervision Grounds pass ordered  2. Right hip hematoma: d/c lovenx=ox, SCDs ordered             -antiplatelet therapy:  Plavix 3. Pain Management: Will schedule tylenol. Use ice prn.oxycodone prn. Aquathermia- cold ordered prn  - 10/12: Pain poorly controlled; schedule at bedtime Baclofen 5 mg  - 10/13: baclofen improved pain overnight; increase to 5 mg BID. Discussed scheduling oxy vs. Adding tramadol; patient does not wish to change regimen at this time  -discussed that oxycodone is helpful, continue current regimen  4. Insomnia: continue Xanax, discussed additional insomnia medications but she defers at this time.   5. Neuropsych/cognition: This patient is usually capable of making decisions on her own behalf. 6. Extensive bruising of right lower extremity: decrease Lovenox to 30mg  7. Fluids/Electrolytes/Nutrition: Monitor I/O. Check CMET in am 8. Confusion/dizziness: On scoplamine patch likely  post op-->discontinued             --will check orthostatic vitals. Limit narcotics.  9. OSA/Nocturnal hypoxia: Continue Oxygen at nights.  --Pulmonary hygiene during the day 10. CAD s/p stent: Treated medically with DAPT and  Crestor. Discussed her history of stent placement  11. Thrombocytopenia: Monitor for signs of bleeding.  12. ABLA: Improved post transfusion. Continue to monitor.              --has hx of iron deficiency in the past. Will add iron supplement.  13. Recurrent hypokalemia: Will continue supplement for a few days. Mg WNL 14. HTN/Hx of atrial tachycardia: Continue Cardizem and metoprolol.    15.  Anxiety/depression: Continue Effexor XR with xanax at bedtime. Magnesium supplement added after supper  16. Hypotension: decrease metoprolol to 25mg  BID   10/14 BP reviewed and has normalized, continue this dose of metoprolol    10/06/2023    5:07 AM 10/05/2023    7:14 PM 10/05/2023    2:05 PM  Vitals with BMI  Systolic 115 121 960  Diastolic 53 48 54  Pulse 71 65 62    17. Skin irritation under breast: nystatin powder ordered, continue  18. Hypocalcemia: calcium supplement ordered, continue  19. Hypokalemia: potassium supplement started  20. Constipation: psyllium ordered   - 10/12: LBM 10/7 per chart; patient endorses good Bms yesterday   - LBM 10/14  21. Intertriginous dermatitis: clotrimazole ordered, continue    LOS: 8 days A FACE TO FACE EVALUATION WAS PERFORMED  Debbera Wolken P Blayze Haen 10/06/2023, 9:12 AM

## 2023-10-06 NOTE — Progress Notes (Signed)
Patient ID: Carrie Chung, female   DOB: [birth year], 82 y.o.   MRN: [id number]  Team Conference Report to Patient/Family  Team Conference discussion was reviewed with the patient and caregiver, including goals, any changes in plan of care and target discharge date.  Patient and caregiver express understanding and are in agreement.  The patient has a target discharge date of 10/09/23.  SW met with patient and provided team conference updates. Patient pleased with d/c and expressed she plans to reduce some on her caregiver hours upon returning home.  No additional questions or concerns.   Andria Rhein 10/06/2023, 2:39 PM

## 2023-10-06 NOTE — Progress Notes (Signed)
Occupational Therapy Session Note  Patient Details  Name: Carrie Chung MRN: [id number] Date of Birth: Dec 05, 1941  Today's Date: 10/06/2023 OT Individual Time: 0804-0900 OT Individual Time Calculation (min): 56 min    Short Term Goals: Week 1:  OT Short Term Goal 1 (Week 1): STG = LTG due to ELOS  Skilled Therapeutic Interventions/Progress Updates:   Pt seen for skilled OT session this am. Pt preferred sink side AM self care retraining vs shower level this am. Pt bed level upon OT arrival with nursing administering meds for pain upon OT arrival. Pt moved to EOB with increased time and CGA with leg lifter support. SPT with RW to w/c with CGA and antalgic hip. Pt performed UB sponge bathing, dressing and grooming with set up and increased time. OT introduced reacher use for LB pull on underwear with pad and pants with min A overall. Pt had wide recliner in room and reported discomfort. OT replaced with alternative, appropriate recliner and pt able to amb with RW with CGA into recliner and elevate LE's reporting increased comfort. Left pt recliner level with needs, nurse call button and chair alarm set.   Pain: 5/10 hip pain with meds and repositioning then recliner rest to 3/10 relief reported   Therapy Documentation Precautions:  Precautions Precautions: Fall Restrictions Weight Bearing Restrictions: Yes RLE Weight Bearing: Weight bearing as tolerated  Therapy/Group: Individual Therapy  Vicenta Dunning 10/06/2023, 8:01 AM

## 2023-10-06 NOTE — Progress Notes (Signed)
Physical Therapy Session Note  Patient Details  Name: Carrie Chung MRN: [id number] Date of Birth: 06-26-1941  Today's Date: 10/06/2023 PT Individual Time: 1130-1200 PT Individual Time Calculation (min): 30 min   Short Term Goals: Week 1:  PT Short Term Goal 1 (Week 1): STG=LTG due to LOS  Skilled Therapeutic Interventions/Progress Updates:       Pt presents sitting in recliner - agreeable to therapy session. Reports unrated R hip pain - denies need for any medication. Pt requesting to work on her standing tolerance during session.  Sit<>stand to RW with supervision. She ambulates from her room to day room rehab gym ~137ft with supervision and RW. Antalgic gait but demonstrates reciprocal stepping and adequate safety awareness with RW management. Cues only for upright posture and keeping body within walker frame.   Without a sitting rest break, she completed games of "Connect-4" in standing at table top with RW support and supervision for standing balance. She completed x2 games with understanding of instructions. She was able to continue standing throughout and ambulate back to her room without a seated rest break the entire session. She concluded treatment sitting in recliner with all needs met.     Therapy Documentation Precautions:  Precautions Precautions: Fall Restrictions Weight Bearing Restrictions: Yes RLE Weight Bearing: Weight bearing as tolerated General:      Therapy/Group: Individual Therapy  Orrin Brigham 10/06/2023, 7:48 AM

## 2023-10-07 LAB — GLUCOSE, CAPILLARY
Glucose-Capillary: 102 mg/dL — ABNORMAL HIGH (ref 70–99)
Glucose-Capillary: 83 mg/dL (ref 70–99)

## 2023-10-07 LAB — BASIC METABOLIC PANEL
Anion gap: 10 (ref 5–15)
BUN: 10 mg/dL (ref 8–23)
CO2: 29 mmol/L (ref 22–32)
Calcium: 8.5 mg/dL — ABNORMAL LOW (ref 8.9–10.3)
Chloride: 102 mmol/L (ref 98–111)
Creatinine, Ser: 0.63 mg/dL (ref 0.44–1.00)
GFR, Estimated: >60 mL/min (ref >60)
Glucose, Bld: 100 mg/dL — ABNORMAL HIGH (ref 70–99)
Potassium: 3.6 mmol/L (ref 3.5–5.1)
Sodium: 141 mmol/L (ref 135–145)

## 2023-10-07 MED ORDER — CITRUS CALCIUM/VITAMIN D 200-6.25 MG-MCG PO TABS
200.0000 mg | ORAL_TABLET | Freq: Every day | ORAL | 0 refills | Status: AC
  Filled 2023-10-07: qty 30, 30d supply, fill #0

## 2023-10-07 MED ORDER — MAGNESIUM OXIDE 400 MG PO TABS
250.0000 mg | ORAL_TABLET | ORAL | 0 refills | Status: DC
  Filled 2023-10-07: qty 30, 60d supply, fill #0

## 2023-10-07 MED ORDER — DOCUSATE SODIUM 100 MG PO CAPS
100.0000 mg | ORAL_CAPSULE | Freq: Every day | ORAL | Status: DC
  Administered 2023-10-08 – 2023-10-09 (×2): 100 mg via ORAL
  Filled 2023-10-07 (×2): qty 1

## 2023-10-07 MED ORDER — CLOTRIMAZOLE 1 % EX CREA
TOPICAL_CREAM | Freq: Two times a day (BID) | CUTANEOUS | 0 refills | Status: DC
  Filled 2023-10-07: qty 30, fill #0

## 2023-10-07 MED ORDER — METOPROLOL TARTRATE 25 MG PO TABS
25.0000 mg | ORAL_TABLET | Freq: Two times a day (BID) | ORAL | 0 refills | Status: DC
  Filled 2023-10-07: qty 60, 30d supply, fill #0

## 2023-10-07 MED ORDER — BACLOFEN 5 MG PO TABS
5.0000 mg | ORAL_TABLET | Freq: Two times a day (BID) | ORAL | 0 refills | Status: DC
  Filled 2023-10-07: qty 60, 30d supply, fill #0

## 2023-10-07 MED ORDER — OXYCODONE HCL 5 MG PO TABS
5.0000 mg | ORAL_TABLET | Freq: Four times a day (QID) | ORAL | 0 refills | Status: DC | PRN
  Filled 2023-10-07: qty 28, 7d supply, fill #0

## 2023-10-07 MED ORDER — POTASSIUM CHLORIDE 20 MEQ PO PACK
40.0000 meq | PACK | Freq: Once | ORAL | Status: AC
  Administered 2023-10-07: 40 meq via ORAL
  Filled 2023-10-07: qty 2

## 2023-10-07 MED ORDER — MELATONIN 3 MG PO TABS
3.0000 mg | ORAL_TABLET | Freq: Every day | ORAL | 0 refills | Status: DC
  Filled 2023-10-07: qty 30, 30d supply, fill #0

## 2023-10-07 MED ORDER — PSYLLIUM 95 % PO PACK
1.0000 | PACK | Freq: Every day | ORAL | 0 refills | Status: DC
  Filled 2023-10-07: qty 240, 240d supply, fill #0

## 2023-10-07 MED ORDER — DOCUSATE SODIUM 100 MG PO CAPS
100.0000 mg | ORAL_CAPSULE | Freq: Every day | ORAL | 0 refills | Status: DC
  Filled 2023-10-07: qty 30, 30d supply, fill #0

## 2023-10-07 MED ORDER — PANTOPRAZOLE SODIUM 40 MG PO TBEC
80.0000 mg | DELAYED_RELEASE_TABLET | Freq: Every day | ORAL | 0 refills | Status: DC
  Filled 2023-10-07: qty 60, 30d supply, fill #0

## 2023-10-07 MED ORDER — ASPIRIN 81 MG PO TABS: 81.0000 mg | ORAL_TABLET | Freq: Every day | ORAL | 0 refills | Status: DC

## 2023-10-07 MED ORDER — ACETAMINOPHEN 325 MG PO TABS: 650.0000 mg | ORAL_TABLET | Freq: Three times a day (TID) | ORAL | Status: AC

## 2023-10-07 MED ORDER — NYSTATIN 100000 UNIT/GM EX POWD
Freq: Two times a day (BID) | CUTANEOUS | 0 refills | Status: DC
Start: 2023-10-07 — End: 2023-11-08
  Filled 2023-10-07: qty 15, 30d supply, fill #0

## 2023-10-07 MED ORDER — VITAMIN D3 25 MCG PO TABS
1000.0000 [IU] | ORAL_TABLET | Freq: Every day | ORAL | 0 refills | Status: DC
  Filled 2023-10-07: qty 30, 30d supply, fill #0

## 2023-10-07 NOTE — Group Note (Signed)
Patient Details Name: Carrie Chung MRN: [id number] DOB: 12-Apr-1941 Today's Date: 10/07/2023  Time Calculation: OT Group Time Calculation OT Group Start Time: 1430 OT Group Stop Time: 1530 OT Group Time Calculation (min): 60 min      Group Description: Dance Group: Pt participated in dance group with an emphasis on social interaction, motor planning, increasing overall activity tolerance and bimanual tasks. All songs were selected by group members. Dance moves included AROM of BUE/BLE gross motor movements with an emphasis on building functional endurance.    Individual level documentation: Patient completed group from sitting level. Patientt needed supervision to complete various dance moves with cues for body positioning.  Patient able to create her own modifications during group.  Pain: Pain Assessment Pain Scale: 0-10 Pain Score: 2  Interventions: repositioning and rest breaks   Precautions:  R LE WBAT  Tollie Pizza Woodson 10/07/2023, 3:51 PM

## 2023-10-07 NOTE — Discharge Instructions (Addendum)
   Inpatient Rehab Discharge Instructions  Carrie Chung Discharge date and time:  10/09/23  Activities/Precautions/ Functional Status: Activity: no lifting, driving, or strenuous exercise for till cleared by MD Diet: cardiac diet and diabetic diet Wound Care: keep wound clean and dry. Contact Dr. Eulah Pont if you develop any problems with your incision/wound--redness, swelling, increase in pain, drainage or if you develop fever or chills.    Functional status:  ___ No restrictions     ___ Walk up steps independently ___ 24/7 supervision/assistance   ___ Walk up steps with assistance _X__ Intermittent supervision/assistance  ___ Bathe/dress independently _X__ Walk with walker    _X__ Bathe/dress with assistance ___ Walk Independently    ___ Shower independently ___ Walk with assistance    ___ Shower with assistance ___ No alcohol     ___ Return to work/school ________   Special Instructions:  COMMUNITY REFERRALS UPON DISCHARGE:     Outpatient: PT     OT                Agency: [facility] Phone: [phone]              Appointment Date/Time: TBD   My questions have been answered and I understand these instructions. I will adhere to these goals and the provided educational materials after my discharge from the hospital.  Patient/Caregiver Signature _______________________________ Date __________  Clinician Signature _______________________________________ Date __________  Please bring this form and your medication list with you to all your follow-up doctor's appointments.

## 2023-10-07 NOTE — Progress Notes (Signed)
Inpatient Rehabilitation Care Coordinator Discharge Note   Patient Details  Name: Carrie Chung MRN: [id number] Date of Birth: March 15, 1941   Discharge location: Home  Length of Stay: 9 Days  Discharge activity level: Supervision/MOD I  Home/community participation: Caregivers  Patient response NW:GNFAOZ Literacy - How often do you need to have someone help you when you read instructions, pamphlets, or other written material from your doctor or pharmacy?: Never  Patient response HY:QMVHQI Isolation - How often do you feel lonely or isolated from those around you?: Never  Services provided included: MD, RD, PT, OT, SLP, RN, CM, Pharmacy, TR, Neuropsych, SW  Financial Services:  Financial Services Utilized: Medicare    Choices offered to/list presented to: patient  Follow-up services arranged:  Outpatient, DME    Outpatient Servicies: Neuro Rehab DME : Patient has all DME.    Patient response to transportation need: Is the patient able to respond to transportation needs?: Yes In the past 12 months, has lack of transportation kept you from medical appointments or from getting medications?: No In the past 12 months, has lack of transportation kept you from meetings, work, or from getting things needed for daily living?: No   Patient/Family verbalized understanding of follow-up arrangements:  Yes  Individual responsible for coordination of the follow-up plan: self  Confirmed correct DME delivered: Carrie Chung 10/07/2023    Comments (or additional information):  Summary of Stay    Date/Time Discharge Planning CSW  10/05/23 1532 Discharge moved to 10/19/ Patient discharging home with caregivers. No barriers. CJB  09/30/23 1032 new admit assesment pending. CJB       Carrie Chung

## 2023-10-07 NOTE — Progress Notes (Signed)
PROGRESS NOTE   Subjective/Complaints: Continues to have drainage and warmth to right hip, no purulence, pain and size of hematoma improving, discussed it may take a day for Lovenox to leave her system   ROS: +pain at site of hip fracture- improved, right hip- continues Positives per HPI above. Denies fevers, chills, N/V, abdominal pain, SOB, chest pain, new weakness or paraesthesias.  +extensive of bruising of right leg, +right hip hematoma- improved   Objective:   No results found. No results for input(s): "WBC", "HGB", "HCT", "PLT" in the last 72 hours.   Recent Labs    10/07/23 0626  NA 141  K 3.6  CL 102  CO2 29  GLUCOSE 100*  BUN 10  CREATININE 0.63  CALCIUM 8.5*     Intake/Output Summary (Last 24 hours) at 10/07/2023 1409 Last data filed at 10/07/2023 1304 Gross per 24 hour  Intake 360 ml  Output --  Net 360 ml         Physical Exam: Vital Signs Blood pressure (!) 114/59, pulse 65, temperature 98.1 F (36.7 C), temperature source Oral, resp. rate 16, height 5' (1.524 m), weight 72.8 kg, SpO2 100%. Gen: no distress, normal appearing. Sitting in bedside wheelchair. HEENT: oral mucosa pink and moist, NCAT Cardio: Bradycardic, regular rhythm. No m/rg/ Chest: normal effort, normal rate of breathing. CTAB Abd: soft, non-distended Ext: no edema Psych: pleasant, normal affect Musculoskeletal:     Cervical back: Neck supple. No tenderness.     Comments: Ue's 5/5 B/L LLE 5-/5  RLE- HF 3-/5; knee 4-/5, otherwise 5-/5 distally +right hip hematoma- improved   Skin:    General: Skin is warm and dry.     Comments: Severe ecchymoses, purple/yellow bruising from R upper lateral hip to just below R lateral knee, 4/5 strength in RLE limited by pain Medial R knee swelling Incisions with minimal bleeding- have steristrips- dried blood around incisions - covered in clean dressing stable 10/17  Intertriginous  dermatitis under right breast  Neurological:  AAOx3  No apparent cognitive deficits  Moving all 4 extremities antigravity and against resistance  Assessment/Plan: 1. Functional deficits which require 3+ hours per day of interdisciplinary therapy in a comprehensive inpatient rehab setting. Physiatrist is providing close team supervision and 24 hour management of active medical problems listed below. Physiatrist and rehab team continue to assess barriers to discharge/monitor patient progress toward functional and medical goals  Care Tool:  Bathing    Body parts bathed by patient: Face, Left upper leg, Right upper leg, Buttocks, Front perineal area, Abdomen, Chest, Left arm, Right arm, Right lower leg, Left lower leg   Body parts bathed by helper: Right lower leg, Left lower leg     Bathing assist Assist Level: Supervision/Verbal cueing     Upper Body Dressing/Undressing Upper body dressing   What is the patient wearing?: Bra, Pull over shirt    Upper body assist Assist Level: Set up assist    Lower Body Dressing/Undressing Lower body dressing      What is the patient wearing?: Underwear/pull up, Pants     Lower body assist Assist for lower body dressing: Contact Guard/Touching assist     Toileting  Toileting    Toileting assist Assist for toileting: Supervision/Verbal cueing     Transfers Chair/bed transfer  Transfers assist     Chair/bed transfer assist level: Supervision/Verbal cueing     Locomotion Ambulation   Ambulation assist      Assist level: Supervision/Verbal cueing Assistive device: Walker-rolling Max distance: 125ft   Walk 10 feet activity   Assist     Assist level: Supervision/Verbal cueing Assistive device: Walker-rolling   Walk 50 feet activity   Assist Walk 50 feet with 2 turns activity did not occur: Safety/medical concerns (fatigue, pain)  Assist level: Supervision/Verbal cueing Assistive device: Walker-rolling    Walk  150 feet activity   Assist Walk 150 feet activity did not occur: Safety/medical concerns (pain, fatigue)  Assist level: Supervision/Verbal cueing Assistive device: Walker-rolling    Walk 10 feet on uneven surface  activity   Assist Walk 10 feet on uneven surfaces activity did not occur: Safety/medical concerns (fatigue, pain)         Wheelchair     Assist Is the patient using a wheelchair?: Yes Type of Wheelchair: Manual    Wheelchair assist level: Dependent - Patient 0% Max wheelchair distance: >325ft    Wheelchair 50 feet with 2 turns activity    Assist        Assist Level: Dependent - Patient 0%   Wheelchair 150 feet activity     Assist      Assist Level: Dependent - Patient 0%   Blood pressure (!) 114/59, pulse 65, temperature 98.1 F (36.7 C), temperature source Oral, resp. rate 16, height 5' (1.524 m), weight 72.8 kg, SpO2 100%.  Medical Problem List and Plan: 1. Functional deficits secondary to R hip fracture s/p IM nail fixation- WBAT             -patient may  shower if ocver incisions             -ELOS/Goals: 1-0-12 days mod I to supervision Grounds pass ordered  2. Right hip hematoma: d/c lovenx=ox, SCDs ordered             -antiplatelet therapy:  Plavix 3. Pain Management: Will schedule tylenol. Use ice prn.oxycodone prn. Aquathermia- cold ordered prn  - 10/12: Pain poorly controlled; schedule at bedtime Baclofen 5 mg  - 10/13: baclofen improved pain overnight; increase to 5 mg BID. Discussed scheduling oxy vs. Adding tramadol; patient does not wish to change regimen at this time  -discussed that oxycodone is helpful, continue current regimen  4. Insomnia: continue Xanax, discussed additional insomnia medications but she defers at this time.   5. Neuropsych/cognition: This patient is usually capable of making decisions on her own behalf. 6. Extensive bruising of right lower extremity: decrease Lovenox to 30mg  7.  Fluids/Electrolytes/Nutrition: Monitor I/O. Check CMET in am 8. Confusion/dizziness: On scoplamine patch likely post op-->discontinued             --will check orthostatic vitals. Limit narcotics.  9. OSA/Nocturnal hypoxia: Continue Oxygen at nights.  --Pulmonary hygiene during the day 10. CAD s/p stent: Treated medically with DAPT and  Crestor. Discussed her history of stent placement  11. Thrombocytopenia: Monitor for signs of bleeding.  12. ABLA: Improved post transfusion. Continue to monitor.              --has hx of iron deficiency in the past. Will add iron supplement.  13. Recurrent hypokalemia: Will continue supplement for a few days. Mg WNL 14. HTN/Hx of atrial tachycardia: Continue Cardizem and  metoprolol.    15. Anxiety/depression: continue Effexor XR with xanax at bedtime. Magnesium supplement added after supper  16. Hypotension: decrease metoprolol to 25mg  BID, discussed with cardiology      10/07/2023    1:46 PM 10/07/2023    5:34 AM 10/06/2023    9:24 PM  Vitals with BMI  Systolic 114 124 161  Diastolic 59 54 62  Pulse 65 63 74    17. Skin irritation under breast: nystatin powder ordered, continue  18. Hypocalcemia: calcium supplement ordered, continue  19. Hypokalemia: additional klor 40 ordered 10/17  20. Constipation: psyllium ordered   - 10/12: LBM 10/7 per chart; patient endorses good Bms yesterday   - LBM 10/14, messaged nursing to confirm this is accurate  21. Intertriginous dermatitis: clotrimazole ordered, continue    LOS: 9 days A FACE TO FACE EVALUATION WAS PERFORMED  Declyn Offield P Kylena Mole 10/07/2023, 2:09 PM

## 2023-10-07 NOTE — Progress Notes (Signed)
Physical Therapy Session Note  Patient Details  Name: Carrie Chung MRN: [id number] Date of Birth: 12/09/1941  Today's Date: 10/07/2023 PT Individual Time: 1914-7829 PT Individual Time Calculation (min): 70 min   Short Term Goals: Week 1:  PT Short Term Goal 1 (Week 1): STG=LTG due to LOS  Skilled Therapeutic Interventions/Progress Updates:   Received pt sitting in recliner, pt agreeable to PT treatment, and reported pain 5-6/10 in R hip. Session with emphasis on functional mobility/transfers, global strengthening and endurance, dynamic standing balance/coordination, simulated car transfers, curb navigation, and gait training. Pt performed all transfers with RW and supervision throughout session. Pt ambulated 139ft with RW and supervision to main therapy gym. Pt ambulates with antalgic gait pattern but demo good improvements with carry over for postural cues and step through gait pattern. Pt navigated 1 4in curb x 2 trials with RW with CGA on trial 1 and close supervision on trial 2 to simulate step going from kitchen<>living room. Retrieved 20x16 manual WC with R elevating legrest for pt as old WC gone.   Pt performed simulated car transfer with RW and supervision with increased time and use of UEs to assist RLE in/out of car. Pt then ambulated 37ft on uneven surfaces (ramp) with RW and supervision - cues to avoid reaching out for external support on handrail. Pt then ambulated additional 169ft x 1 and 145ft x 1 with RW and supervision back to room. Pt reported feeling nauseous - RN notified and provided pt with emesis bag. Concluded session with pt sitting in recliner, needs within reach, and chair pad alarm on. Provided pt with ice pack for R hip.   Therapy Documentation Precautions:  Precautions Precautions: Fall Restrictions Weight Bearing Restrictions: Yes RLE Weight Bearing: Weight bearing as tolerated  Therapy/Group: Individual Therapy Marlana Salvage Zaunegger Blima Rich PT,  DPT 10/07/2023, 6:55 AM

## 2023-10-07 NOTE — Progress Notes (Signed)
Occupational Therapy Session Note  Patient Details  Name: Carrie Chung MRN: [id number] Date of Birth: Mar 15, 1941  Today's Date: 10/07/2023 OT Individual Time: 0846-1000 OT Individual Time Calculation (min): 74 min    Short Term Goals: Week 1:  OT Short Term Goal 1 (Week 1): STG = LTG due to ELOS  Skilled Therapeutic Interventions/Progress Updates:  Skilled OT intervention completed with focus on wound care, toileting needs, ambulatory transfers, dynamic standing balance. Pt received seated in recliner, agreeable to session. Discomfort reported in Rt hip; pre-medicated. OT offered rest breaks and repositioning throughout for pain reduction.  Pt reported that her incisions on Rt hip felt warm to the touch and she was concerned about continued draining with request for OT to check incisions and notify MD about checking with cardiologist for potential anticoagulant med change. Pt stood with supervision using RW, lowered pants as well. OT removed both bandages, with proximal one without active drainage therefore left uncovered due to steri strips in place. Noted to be warmer to touch but overall no signs of infection. Distal incision, with small drainage but mostly older drainage. Recovered with pressure dressing. Donned pants with supervision over hips.  Completed all sit > stands and ambulatory transfers with RW and supervision during session.  Ambulated to toilet. Supervision for mild incontinent, but further continent urinary void and toileting steps. Ambulated > sink for supervision oral care and hand hygiene at sink. MD present for rounds to discuss wounds.  Ambulated with close supervision using RW > gym (> 150 ft). Seated rest break for fatigue.   Pt participated in the following dynamic standing balance and endurance tasks to promote independence and safety during BADLs and functional mobility: -placing and retrieving squigz from long mirror. Supervision sit > stand with RW while  on airex pad. Cues needed for remembering stepping up on airex pad and going down with the bad. CGA needed for balance during task  Ambulated back to room. Pt remained seated in recliner, with chair alarm on/activated, ice applied to Rt hip, and with all needs in reach at end of session.   Therapy Documentation Precautions:  Precautions Precautions: Fall Restrictions Weight Bearing Restrictions: Yes RLE Weight Bearing: Weight bearing as tolerated    Therapy/Group: Individual Therapy  Melvyn Novas, MS, OTR/L  10/07/2023, 10:48 AM

## 2023-10-08 ENCOUNTER — Other Ambulatory Visit (HOSPITAL_COMMUNITY): Payer: Self-pay

## 2023-10-08 DIAGNOSIS — N390 Urinary tract infection, site not specified: Secondary | ICD-10-CM | POA: Insufficient documentation

## 2023-10-08 DIAGNOSIS — E119 Type 2 diabetes mellitus without complications: Secondary | ICD-10-CM

## 2023-10-08 DIAGNOSIS — D62 Acute posthemorrhagic anemia: Secondary | ICD-10-CM | POA: Insufficient documentation

## 2023-10-08 DIAGNOSIS — R7303 Prediabetes: Secondary | ICD-10-CM

## 2023-10-08 DIAGNOSIS — K5900 Constipation, unspecified: Secondary | ICD-10-CM | POA: Insufficient documentation

## 2023-10-08 LAB — URINALYSIS, ROUTINE W REFLEX MICROSCOPIC
Bilirubin Urine: NEGATIVE
Glucose, UA: NEGATIVE mg/dL
Ketones, ur: NEGATIVE mg/dL
Nitrite: NEGATIVE
Protein, ur: 100 mg/dL — AB
RBC / HPF: >50 RBC/hpf (ref 0–5)
Specific Gravity, Urine: 1.01 (ref 1.005–1.030)
WBC, UA: >50 WBC/hpf (ref 0–5)
pH: 5 (ref 5.0–8.0)

## 2023-10-08 MED ORDER — LIDOCAINE HCL URETHRAL/MUCOSAL 2 % EX GEL: CUTANEOUS | Status: DC | PRN

## 2023-10-08 MED ORDER — CEPHALEXIN 500 MG PO CAPS
500.0000 mg | ORAL_CAPSULE | Freq: Two times a day (BID) | ORAL | 0 refills | Status: DC
  Filled 2023-10-08: qty 12, 6d supply, fill #0

## 2023-10-08 MED ORDER — CEPHALEXIN 250 MG PO CAPS
500.0000 mg | ORAL_CAPSULE | Freq: Two times a day (BID) | ORAL | Status: DC
  Administered 2023-10-08 – 2023-10-09 (×3): 500 mg via ORAL
  Filled 2023-10-08 (×3): qty 2

## 2023-10-08 MED ORDER — METOPROLOL TARTRATE 50 MG PO TABS
50.0000 mg | ORAL_TABLET | Freq: Two times a day (BID) | ORAL | 0 refills | Status: DC
  Filled 2023-10-08: qty 60, 30d supply, fill #0

## 2023-10-08 MED ORDER — EPINEPHRINE (ANAPHYLAXIS) 1 MG/ML IJ SOLN: 0.1500 mg | Freq: Once | INTRAMUSCULAR | Status: DC | PRN

## 2023-10-08 MED ORDER — ASPIRIN 81 MG PO TABS: 81.0000 mg | ORAL_TABLET | Freq: Every day | ORAL | Status: DC

## 2023-10-08 MED ORDER — METOPROLOL TARTRATE 50 MG PO TABS
50.0000 mg | ORAL_TABLET | Freq: Two times a day (BID) | ORAL | Status: DC
  Administered 2023-10-08 – 2023-10-09 (×2): 50 mg via ORAL
  Filled 2023-10-08 (×2): qty 1

## 2023-10-08 NOTE — Progress Notes (Signed)
Occupational Therapy Discharge Summary  Patient Details  Name: Carrie Chung MRN: [id number] Date of Birth: 18-Feb-1941  Date of Discharge from OT service:October 08, 2023   Patient has met 8 of 8 long term goals due to improved activity tolerance, improved balance, functional use of  RIGHT lower extremity, and improved coordination.  Patient to discharge at overall Modified Independent level.  Patient's care partner is independent to provide the necessary physical assistance at discharge. Pt will be supported by daughter and hired Psychologist, clinical.   All goals met  Recommendation:  Patient will benefit from ongoing skilled OT services in outpatient setting to continue to advance functional skills in the area of iADL.  Equipment: No equipment provided  Reasons for discharge: treatment goals met  Patient/family agrees with progress made and goals achieved: Yes  OT Discharge Precautions/Restrictions  Precautions Precautions: Fall Restrictions Weight Bearing Restrictions: No ADL ADL Equipment Provided: Long-handled sponge Eating: Independent Where Assessed-Eating: Chair Grooming: Modified independent Where Assessed-Grooming: Standing at sink Upper Body Bathing: Independent Where Assessed-Upper Body Bathing: Shower Lower Body Bathing: Modified independent Where Assessed-Lower Body Bathing: Shower Upper Body Dressing: Independent Where Assessed-Upper Body Dressing: Sitting at sink Lower Body Dressing: Modified independent Where Assessed-Lower Body Dressing: Sitting at sink, Standing at sink Toileting: Modified independent Where Assessed-Toileting: Teacher, adult education: Modified Community education officer Method: Proofreader: Other (comment), Raised toilet seat (RW) Tub/Shower Transfer: Modified independent Tub/Shower Transfer Method: Ship broker: Insurance underwriter: Close supervision Training and development officer Method: Designer, industrial/product: Emergency planning/management officer, Grab bars Vision Baseline Vision/History: 1 Wears glasses Patient Visual Report: No change from baseline Vision Assessment?: Wears glasses for reading;No apparent visual deficits Perception  Perception: Within Functional Limits Praxis Praxis: WFL Cognition Cognition Overall Cognitive Status: Within Functional Limits for tasks assessed Arousal/Alertness: Awake/alert Orientation Level: Person;Place;Situation Person: Oriented Place: Oriented Situation: Oriented Memory: Impaired Awareness: Appears intact Problem Solving: Appears intact Safety/Judgment: Appears intact Brief Interview for Mental Status (BIMS) Repetition of Three Words (First Attempt): 3 Temporal Orientation: Year: Correct Temporal Orientation: Month: Missed by 6 days to 1 month Temporal Orientation: Day: Correct Recall: "Sock": No, could not recall Recall: "Blue": Yes, no cue required Recall: "Bed": Yes, after cueing ("a piece of furniture") BIMS Summary Score: 11 Sensation Sensation Light Touch: Impaired Detail Light Touch Impaired Details: Impaired RLE;Impaired LLE Hot/Cold: Appears Intact Proprioception: Appears Intact Stereognosis: Not tested Additional Comments: pt reports numbnes along bilateral plantar aspects of feet. Decreased sensation along RLE. Coordination Gross Motor Movements are Fluid and Coordinated: No Fine Motor Movements are Fluid and Coordinated: Yes Coordination and Movement Description: altered balance strategies due to pain in R hip Finger Nose Finger Test: Mild tremor on LUE but WFL Motor  Motor Motor: Within Functional Limits Motor - Skilled Clinical Observations: pain and global weakness/deconditioning Mobility  Bed Mobility Bed Mobility: Rolling Right;Rolling Left;Sit to Supine;Supine to Sit Rolling Right: Independent with assistive device Rolling Left: Independent with assistive device Supine to Sit:  Independent with assistive device Sit to Supine: Independent with assistive device Transfers Sit to Stand: Independent with assistive device Stand to Sit: Independent with assistive device  Trunk/Postural Assessment  Cervical Assessment Cervical Assessment: Within Functional Limits Thoracic Assessment Thoracic Assessment: Exceptions to [facility] (thoracic rounding) Lumbar Assessment Lumbar Assessment: Within Functional Limits Postural Control Postural Control: Within Functional Limits  Balance Balance Balance Assessed: Yes Static Sitting Balance Static Sitting - Balance Support: Feet supported;Bilateral upper extremity supported Static Sitting - Level of Assistance:  7: Independent Dynamic Sitting Balance Dynamic Sitting - Balance Support: Feet supported;No upper extremity supported Dynamic Sitting - Level of Assistance: 6: Modified independent (Device/Increase time) Static Standing Balance Static Standing - Balance Support: Bilateral upper extremity supported;During functional activity (RW) Static Standing - Level of Assistance: 6: Modified independent (Device/Increase time) Dynamic Standing Balance Dynamic Standing - Balance Support: Bilateral upper extremity supported;During functional activity (RW) Dynamic Standing - Level of Assistance: 6: Modified independent (Device/Increase time) Dynamic Standing - Comments: with transfers and gait Extremity/Trunk Assessment RUE Assessment RUE Assessment: Within Functional Limits LUE Assessment LUE Assessment: Within Functional Limits   Kalianne Fetting E Kelin Nixon, MS, OTR/L  10/08/2023, 12:09 PM

## 2023-10-08 NOTE — Progress Notes (Signed)
Physical Therapy Discharge Summary  Patient Details  Name: Carrie Chung MRN: [id number] Date of Birth: [birth year]  Date of Discharge from PT service:October 08, 2023  Today's Date: 10/08/2023 PT Individual Time: 1400-1458 PT Individual Time Calculation (min): 58 min   Patient has met 8 of 8 long term goals due to improved activity tolerance, improved balance, improved postural control, increased strength, increased range of motion, decreased pain, ability to compensate for deficits, improved attention, improved awareness, and improved coordination.  Patient to discharge at an ambulatory level Modified Independent using RW. Patient's care partner is independent to provide the necessary physical assistance at discharge. Pt to discharge home with intermittent assist provided by daughters and aides.   All goals met  Recommendation:  Patient will benefit from ongoing skilled PT services in outpatient setting to continue to advance safe functional mobility, address ongoing impairments in transfers, global strengthening and endurance, dynamic standing balance/coordination, gait training, and to minimize fall risk.  Equipment: No equipment provided - already has RW  Reasons for discharge: treatment goals met and discharge from hospital  Patient/family agrees with progress made and goals achieved: Yes  Today's Interventions: Received pt sitting in recliner asleep. Upon wakening, pt agreeable to PT treatment, and reported pain 4/10 in R hip. Session with emphasis on discharge planning, functional mobility/transfers, global strengthening and endurance, dynamic standing balance/coordination, and gait training.  Went through sensation, MMT, and pain interference questionnaire in preparation for discharge. Pt performed all transfers with RW and mod I throughout session. Pt ambulated 127ft x 2 trials with RW and mod I to/from dayroom. Pt able to stand with RW and pick up object from floor using  reacher and supervision. Worked on dynamic standing balance dribbling basketball with CGA, then transitioning to caching/bouncing ball back and forth with rehab tech for 1 minute x 2 trials with CGA - pt with 2 posterior LOB onto mat. Returned to room and concluded session with pt sitting in recliner with all needs within reach.  PT Discharge Precautions/Restrictions Precautions Precautions: Fall Restrictions Weight Bearing Restrictions: No Pain Interference Pain Interference Pain Effect on Sleep: 1. Rarely or not at all Pain Interference with Therapy Activities: 2. Occasionally Pain Interference with Day-to-Day Activities: 1. Rarely or not at all Cognition Overall Cognitive Status: Within Functional Limits for tasks assessed Arousal/Alertness: Awake/alert Orientation Level: Oriented X4 Memory: Impaired Awareness: Appears intact Problem Solving: Appears intact Safety/Judgment: Appears intact Sensation Sensation Light Touch: Impaired Detail Light Touch Impaired Details: Impaired RLE Hot/Cold: Appears Intact Proprioception: Appears Intact Stereognosis: Not tested Additional Comments: decreased sensation RLE>LLE Coordination Gross Motor Movements are Fluid and Coordinated: Yes Fine Motor Movements are Fluid and Coordinated: Yes Coordination and Movement Description: pain and global weakness/deconditioning Finger Nose Finger Test: Mild tremor on LUE but WFL Heel Shin Test: WFL on LLE, unable to perform on RLE Motor  Motor Motor: Within Functional Limits Motor - Skilled Clinical Observations: pain and global weakness/deconditioning  Mobility Bed Mobility Bed Mobility: Rolling Right;Rolling Left;Sit to Supine;Supine to Sit Rolling Right: Independent with assistive device Rolling Left: Independent with assistive device Supine to Sit: Independent with assistive device Sit to Supine: Independent with assistive device Transfers Transfers: Sit to Stand;Stand to Sit;Stand Pivot  Transfers Sit to Stand: Independent with assistive device Stand to Sit: Independent with assistive device Stand Pivot Transfers: Independent with assistive device Transfer (Assistive device): Rolling walker (youth) Locomotion  Gait Ambulation: Yes Gait Assistance: Independent with assistive device Gait Distance (Feet): 180 Feet Assistive device: Rolling walker Gait Gait:  Yes Gait Pattern: Impaired Gait Pattern: Step-through pattern;Decreased step length - right;Decreased stride length;Antalgic;Poor foot clearance - right Gait velocity: decreased Stairs / Additional Locomotion Stairs: Yes Stairs Assistance: Supervision/Verbal cueing Stair Management Technique: Forwards;With walker Number of Stairs: 1 Height of Stairs: 5 Ramp: Supervision/Verbal cueing (RW) Curb: Supervision/Verbal cueing (RW) Wheelchair Mobility Wheelchair Mobility: No  Trunk/Postural Assessment  Cervical Assessment Cervical Assessment: Within Functional Limits Thoracic Assessment Thoracic Assessment: Exceptions to [facility] (thoracic rounding) Lumbar Assessment Lumbar Assessment: Within Functional Limits Postural Control Postural Control: Within Functional Limits  Balance Balance Balance Assessed: Yes Static Sitting Balance Static Sitting - Balance Support: Feet supported;Bilateral upper extremity supported Static Sitting - Level of Assistance: 7: Independent Dynamic Sitting Balance Dynamic Sitting - Balance Support: Feet supported;No upper extremity supported Dynamic Sitting - Level of Assistance: 6: Modified independent (Device/Increase time) Static Standing Balance Static Standing - Balance Support: Bilateral upper extremity supported;During functional activity (RW) Static Standing - Level of Assistance: 6: Modified independent (Device/Increase time) Dynamic Standing Balance Dynamic Standing - Balance Support: Bilateral upper extremity supported;During functional activity (RW) Dynamic Standing - Level of  Assistance: 6: Modified independent (Device/Increase time) Dynamic Standing - Comments: with transfers and gait Extremity Assessment  RLE Assessment RLE Assessment: Exceptions to [facility] Strength Comments: tested sitting in recliner RLE Strength Right Hip Flexion: 3-/5 Right Hip ABduction: 3+/5 Right Hip ADduction: 3+/5 Right Knee Flexion: 3+/5 Right Knee Extension: 3+/5 Right Ankle Dorsiflexion: 3+/5 Right Ankle Plantar Flexion: 4-/5 LLE Assessment LLE Assessment: Exceptions to [facility] General Strength Comments: tested sitting in recliner LLE Strength Left Hip Flexion: 4-/5 Left Hip ABduction: 4-/5 Left Hip ADduction: 4-/5 Left Knee Flexion: 4-/5 Left Knee Extension: 4/5 Left Ankle Dorsiflexion: 4+/5 Left Ankle Plantar Flexion: 4+/5   Karianne Nogueira M Zaunegger Blima Rich PT, DPT 10/08/2023, 6:57 AM

## 2023-10-08 NOTE — Progress Notes (Signed)
Occupational Therapy Session Note  Patient Details  Name: Carrie Chung MRN: [id number] Date of Birth: 1941/11/03  Today's Date: 10/08/2023 OT Individual Time: 4098-1191 OT Individual Time Calculation (min): 70 min    Short Term Goals: Week 1:  OT Short Term Goal 1 (Week 1): STG = LTG due to ELOS  Skilled Therapeutic Interventions/Progress Updates:  Skilled OT intervention completed with focus on ADL retraining, functional endurance, and mobility within a shower context. Pt received upright in bed, agreeable to session. "Peeing pain" reported; nurse/MD notified/already aware. OT offered rest breaks, repositioning and moist heat via shower for pain relief.  Pt completed all sit > stands and ambulatory transfers with supervision/mod I while using RW.   Able to transition to EOB with mod I using leg lifter for RLE. Ambulated to TTB in shower. MD present for rounds. Pt reported urge to void, but had incontinent episode and was able to transition to toilet with supervision for further urinary void; MD aware of UTI symptoms. Pt noted to be slightly more slow processing and confusion this date; MD notified of pt request in later part of session to move forward with the antibiotic.  Waterproof cover applied to Rt distal incision prior to shower. Pt was able to bathe all parts with mod I, at the seated level while using grab bar for balance. Utilized long handled sponge to access BLE and periareas as applicable. Supervision ambulatory transfer > w/c at sink. Able to donn shirt/deo with independently. Bandages changed on Rt hip after shower; nurse notified. Threaded LB clothing with supervision at the sit > stand level using RW with use of reacher. Donned shoes independently (slip on), along with oral care and hair grooming.  Pt ambulated <> gym (150 ft x2) with extended seated rest break intermittently. Pt remained seated in recliner, with chair alarm on/activated, and with all needs in reach at  end of session.   Therapy Documentation Precautions:  Precautions Precautions: Fall Restrictions Weight Bearing Restrictions: No RLE Weight Bearing: Weight bearing as tolerated    Therapy/Group: Individual Therapy  Melvyn Novas, MS, OTR/L  10/08/2023, 12:08 PM

## 2023-10-08 NOTE — Discharge Summary (Signed)
Physician Discharge Summary  Patient ID: Carrie Chung MRN: [id number] DOB/AGE: 1940-12-26 82 y.o.  Admit date: 09/28/2023 Discharge date: 10/08/2023  Discharge Diagnoses:  Principal Problem:   Closed right hip fracture, sequela Active Problems:   HYPERTENSION, BENIGN   Insomnia   Depression with anxiety   UTI (urinary tract infection)   Acute blood loss anemia   Pre-diabetes   Constipation   Discharged Condition: stable  Significant Diagnostic Studies: VAS [place] LOWER EXTREMITY VENOUS (DVT)  Result Date: 09/29/2023  Lower Venous DVT Study Patient Name:  Carrie Chung  Date of Exam:   09/29/2023 Medical Rec #: [id number]           Accession #:    [id number] Date of Birth: [birth year]           Patient Gender: F Patient Age:   78 years Exam Location:  [facility] Procedure:      VAS [place] LOWER EXTREMITY VENOUS (DVT) Referring Phys: Cerita Rabelo --------------------------------------------------------------------------------  Indications: Immobility.  Risk Factors: Surgery Trauma. Limitations: Poor ultrasound/tissue interface and patient positioning. Comparison Study: No prior studies. Performing Technologist: Chanda Busing RVT  Examination Guidelines: A complete evaluation includes B-mode imaging, spectral Doppler, color Doppler, and power Doppler as needed of all accessible portions of each vessel. Bilateral testing is considered an integral part of a complete examination. Limited examinations for reoccurring indications may be performed as noted. The reflux portion of the exam is performed with the patient in reverse Trendelenburg.  +---------+---------------+---------+-----------+----------+--------------+ RIGHT    CompressibilityPhasicitySpontaneityPropertiesThrombus Aging +---------+---------------+---------+-----------+----------+--------------+ CFV      Full           Yes      Yes                                  +---------+---------------+---------+-----------+----------+--------------+ SFJ      Full                                                        +---------+---------------+---------+-----------+----------+--------------+ FV Prox  Full                                                        +---------+---------------+---------+-----------+----------+--------------+ FV Mid   Full                                                        +---------+---------------+---------+-----------+----------+--------------+ FV DistalFull                                                        +---------+---------------+---------+-----------+----------+--------------+ PFV      Full                                                        +---------+---------------+---------+-----------+----------+--------------+  POP      Full           Yes      Yes                                 +---------+---------------+---------+-----------+----------+--------------+ PTV      Full                                                        +---------+---------------+---------+-----------+----------+--------------+ PERO     Full                                                        +---------+---------------+---------+-----------+----------+--------------+   +---------+---------------+---------+-----------+----------+--------------+ LEFT     CompressibilityPhasicitySpontaneityPropertiesThrombus Aging +---------+---------------+---------+-----------+----------+--------------+ CFV      Full           Yes      Yes                                 +---------+---------------+---------+-----------+----------+--------------+ SFJ      Full                                                        +---------+---------------+---------+-----------+----------+--------------+ FV Prox  Full                                                         +---------+---------------+---------+-----------+----------+--------------+ FV Mid   Full                                                        +---------+---------------+---------+-----------+----------+--------------+ FV DistalFull                                                        +---------+---------------+---------+-----------+----------+--------------+ PFV      Full                                                        +---------+---------------+---------+-----------+----------+--------------+ POP      Full           Yes      Yes                                 +---------+---------------+---------+-----------+----------+--------------+  PTV      Full                                                        +---------+---------------+---------+-----------+----------+--------------+ PERO     Full                                                        +---------+---------------+---------+-----------+----------+--------------+     Summary: RIGHT: - There is no evidence of deep vein thrombosis in the lower extremity.  - No cystic structure found in the popliteal fossa.  LEFT: - There is no evidence of deep vein thrombosis in the lower extremity.  - No cystic structure found in the popliteal fossa.  *See table(s) above for measurements and observations. Electronically signed by Coral Else MD on 09/29/2023 at 9:03:09 PM.    Final     Labs:  Basic Metabolic Panel:    Latest Ref Rng & Units 10/07/2023    6:26 AM 10/04/2023    6:00 AM 09/30/2023    6:31 AM  BMP  Glucose 70 - 99 mg/dL 161  096  045   BUN 8 - 23 mg/dL 10  8  11    Creatinine 0.44 - 1.00 mg/dL 4.09  8.11  9.14   Sodium 135 - 145 mmol/L 141  141  138   Potassium 3.5 - 5.1 mmol/L 3.6  3.9  4.0   Chloride 98 - 111 mmol/L 102  106  101   CO2 22 - 32 mmol/L 29  29  30    Calcium 8.9 - 10.3 mg/dL 8.5  8.4  8.2      CBC:    Latest Ref Rng & Units 10/04/2023    6:00 AM 09/30/2023    6:31 AM  09/29/2023    5:01 AM  CBC  WBC 4.0 - 10.5 K/uL 4.5  4.9  4.9   Hemoglobin 12.0 - 15.0 g/dL 9.3  9.1  9.5   Hematocrit 36.0 - 46.0 % 30.5  29.3  29.7   Platelets 150 - 400 K/uL 221  173  142      CBG: Recent Labs  Lab 10/05/23 1650 10/05/23 2106 10/06/23 2126 10/07/23 0611 10/07/23 1155  GLUCAP 96 111* 135* 102* 83    Brief HPI:   Carrie Chung is a 82 y.o. female with history of CAD, atrial tachycardia, HTN, OSA who was admitted on 09/10/2023 after mechanical fall with onset of right hip pain and inability to walk.  She was found to have right IT femur fracture and underwent IM nail 10/01 by Dr. Eulah Pont.  Postop issues with significant hypokalemia was supplemented and acute blood loss anemia was treated with 1 units PRBC.  Therapy was ongoing and was limited due to pain and fatigue.  She has had decline in functional status and CIR was recommended for follow-up therapy.   Hospital Course: CAMARA FLORER was admitted to rehab 09/28/2023 for inpatient therapies to consist of PT and OT at least three hours five days a week. Past admission physiatrist, therapy team and rehab RN have worked together to provide customized collaborative inpatient rehab. BLE dopplers were negative for DVT.  Her blood pressures were monitored on TID basis and have been stable. She developed swelling right thigh with bloody drainage therefore lovenox was discontinued. BLE dopplers were negative for DVT. Incision is healing well and drainage has resolved. Low dose ASA resumed and she was advised on importance of mobility after discharge.   Her blood sugars were monitored with ac/hs CBG checks and SSI was use prn for tighter BS control. BS have been controlled on diet alone. Po intake continues to vary and anticipate improvement after discharge. No cardiac symptoms noted with increase in activity.  Pain control with as needed use of oxycodone.  Follow-up CBC showed thrombocytopenia has resolved.  Recurrent  hypokalemia has resolved with supplementation.  Metoprolol has been adjusted during stay to manage blood pressure and heart rate.  Urine culture done showed evidence of E. coli UTI and she was started on Keflex for treatment.  Status has been stable with use of oxygen at night to manage OSA.  She has made steady gains during her rehab stay and supervision is recommended for safety.  She will continue to receive follow-up outpatient PT and OT after discharge.    Rehab course: During patient's stay in rehab weekly team conferences were held to monitor patient's progress, set goals and discuss barriers to discharge. At admission, patient required min assist with basic self care tasks and with mobility. She  has had improvement in activity tolerance, balance, postural control as well as ability to compensate for deficits. She is independent for transfers and to ambulate 180' with use of RW. She is able to complete ADL tasks at modified independent level.   Disposition: Home  Diet: Carb Modified  Special Instructions: Recommend supervision with ambulation.   Allergies as of 10/08/2023       Reactions   Penicillins Hives, Swelling, Other (See Comments)   Sulfonamide Derivatives Hives, Swelling   Lactose Intolerance (gi) Diarrhea   Sulfamethoxazole Swelling        Medication List     STOP taking these medications    bisacodyl 10 MG suppository Commonly known as: DULCOLAX   Calcium Carbonate-Vitamin D 600-400 MG-UNIT tablet   clotrimazole-betamethasone cream Commonly known as: LOTRISONE   esomeprazole 40 MG capsule Commonly known as: NEXIUM Replaced by: pantoprazole 40 MG tablet   feeding supplement (KATE FARMS STANDARD 1.4) Liqd liquid   hydrochlorothiazide 25 MG tablet Commonly known as: HYDRODIURIL   metFORMIN 500 MG tablet Commonly known as: GLUCOPHAGE   methocarbamol 500 MG tablet Commonly known as: ROBAXIN   multivitamin with minerals Tabs tablet   nitroGLYCERIN 0.4  MG SL tablet Commonly known as: NITROSTAT   OXYGEN   polyethylene glycol 17 g packet Commonly known as: MIRALAX / GLYCOLAX   prochlorperazine 10 MG tablet Commonly known as: COMPAZINE       TAKE these medications    acetaminophen 325 MG tablet Commonly known as: TYLENOL Take 2 tablets (650 mg total) by mouth 4 (four) times daily -  with meals and at bedtime. What changed:  how much to take when to take this reasons to take this   alprazolam 2 MG tablet Commonly known as: XANAX TAKE 1 TABLET(2 MG) BY MOUTH AT BEDTIME AS NEEDED FOR SLEEP   alum & mag hydroxide-simeth 200-200-20 MG/5ML suspension Commonly known as: MAALOX/MYLANTA Take 30 mLs by mouth every 4 (four) hours as needed for indigestion.   aspirin 81 MG tablet Take 1 tablet (81 mg total) by mouth daily. Start taking on: October 09, 2023  Baclofen 5 MG Tabs Take 1 tablet (5 mg total) by mouth 2 (two) times daily.   cephALEXin 500 MG capsule Commonly known as: KEFLEX Take 1 capsule (500 mg total) by mouth every 12 (twelve) hours.   Citrus Calcium/Vitamin D 200-6.25 MG-MCG Tabs Take 200 mg of elemental calcium by mouth daily after supper.   clopidogrel 75 MG tablet Commonly known as: PLAVIX TAKE 1 TABLET(75 MG) BY MOUTH DAILY   clotrimazole 1 % cream Commonly known as: LOTRIMIN Apply topically 2 (two) times daily.   desvenlafaxine 50 MG 24 hr tablet Commonly known as: PRISTIQ TAKE 1 TABLET(50 MG) BY MOUTH DAILY   diltiazem 240 MG 24 hr capsule Commonly known as: CARDIZEM CD Take 1 capsule (240 mg total) by mouth daily.   docusate sodium 100 MG capsule Commonly known as: COLACE Take 1 capsule (100 mg total) by mouth daily. What changed: when to take this   magnesium oxide 400 MG tablet Commonly known as: MAG-OX Take 0.5 tablets (200 mg total) by mouth daily after supper.   melatonin 3 MG Tabs tablet Take 1 tablet (3 mg total) by mouth at bedtime.   metoprolol tartrate 50 MG  tablet Commonly known as: LOPRESSOR Take 1 tablet (50 mg total) by mouth 2 (two) times daily. What changed:  medication strength how much to take   nystatin powder Commonly known as: MYCOSTATIN/NYSTOP Apply topically 2 (two) times daily.   oxyCODONE 5 MG immediate release tablet--Rx # 28 pills Commonly known as: Oxy IR/ROXICODONE Take 1 tablet (5 mg total) by mouth every 6 (six) hours as needed for severe pain (pain score 7-10).   pantoprazole 40 MG tablet Commonly known as: PROTONIX Take 2 tablets (80 mg total) by mouth daily at 12 noon. Replaces: esomeprazole 40 MG capsule   psyllium 95 % Pack Commonly known as: HYDROCIL/METAMUCIL Take 1 packet by mouth daily.   rosuvastatin 20 MG tablet Commonly known as: CRESTOR TAKE 1 TABLET(20 MG) BY MOUTH DAILY   vitamin D3 25 MCG tablet Commonly known as: CHOLECALCIFEROL Take 1 tablet (1,000 Units total) by mouth daily after supper.        Follow-up Information     Baxley, Luanna Cole, MD. Call.   Specialty: Internal Medicine Why: Monday to set up post hospital follow up Contact information: [address] [place] [address] [phone]         Horton Chin, MD Follow up.   Specialty: Physical Medicine and Rehabilitation Why: office will call you with follow up appointment Contact information: [address] [place] [address] [phone]         Sheral Apley, MD. Call.   Specialty: Orthopedic Surgery Why: Monday to set up post op appointment Contact information: [address] [place] [address] [phone]                 Signed: Jacquelynn Cree 10/08/2023, 6:07 PM

## 2023-10-08 NOTE — Progress Notes (Signed)
PROGRESS NOTE   Subjective/Complaints: More confused this morning, with dysuria and abdominal pain, UA/UC ordered and drawn, keflex started, she says she does not commonly get UTIs   ROS: +pain at site of hip fracture- improved, right hip- continues Positives per HPI above. Denies fevers, chills, N/V, abdominal pain, SOB, chest pain, new weakness or paraesthesias.  +extensive of bruising of right leg, +right hip hematoma- improved   Objective:   No results found. No results for input(s): "WBC", "HGB", "HCT", "PLT" in the last 72 hours.   Recent Labs    10/07/23 0626  NA 141  K 3.6  CL 102  CO2 29  GLUCOSE 100*  BUN 10  CREATININE 0.63  CALCIUM 8.5*     Intake/Output Summary (Last 24 hours) at 10/08/2023 1008 Last data filed at 10/08/2023 0952 Gross per 24 hour  Intake 800 ml  Output --  Net 800 ml         Physical Exam: Vital Signs Blood pressure (!) 145/68, pulse 77, temperature 98.5 F (36.9 C), temperature source Oral, resp. rate 17, height 5' (1.524 m), weight 72.8 kg, SpO2 95%. Gen: no distress, normal appearing. Sitting in bedside wheelchair. HEENT: oral mucosa pink and moist, NCAT Cardio: Bradycardic, regular rhythm. No m/rg/ Chest: normal effort, normal rate of breathing. CTAB Abd: soft, non-distended Ext: no edema Psych: pleasant, normal affect Musculoskeletal:     Cervical back: Neck supple. No tenderness.     Comments: Ue's 5/5 B/L LLE 5-/5  RLE- HF 3-/5; knee 4-/5, otherwise 5-/5 distally +right hip hematoma- improved   Skin:    General: Skin is warm and dry.     Comments: Severe ecchymoses, purple/yellow bruising from R upper lateral hip to just below R lateral knee, 4/5 strength in RLE limited by pain Medial R knee swelling Incisions with minimal bleeding- have steristrips- dried blood around incisions - covered in clean dressing stable 10/18  Intertriginous dermatitis under right  breast  Neurological:  AAOx3  No apparent cognitive deficits  Moving all 4 extremities antigravity and against resistance  Assessment/Plan: 1. Functional deficits which require 3+ hours per day of interdisciplinary therapy in a comprehensive inpatient rehab setting. Physiatrist is providing close team supervision and 24 hour management of active medical problems listed below. Physiatrist and rehab team continue to assess barriers to discharge/monitor patient progress toward functional and medical goals  Care Tool:  Bathing    Body parts bathed by patient: Face, Left upper leg, Right upper leg, Buttocks, Front perineal area, Abdomen, Chest, Left arm, Right arm, Right lower leg, Left lower leg   Body parts bathed by helper: Right lower leg, Left lower leg     Bathing assist Assist Level: Independent with assistive device Assistive Device Comment: long handled sponge, TTB   Upper Body Dressing/Undressing Upper body dressing   What is the patient wearing?: Bra, Pull over shirt    Upper body assist Assist Level: Independent    Lower Body Dressing/Undressing Lower body dressing      What is the patient wearing?: Underwear/pull up, Pants     Lower body assist Assist for lower body dressing: Independent with assitive device Assistive Device Comment: reacher  Toileting Toileting    Toileting assist Assist for toileting: Independent with assistive device     Transfers Chair/bed transfer  Transfers assist     Chair/bed transfer assist level: Supervision/Verbal cueing     Locomotion Ambulation   Ambulation assist      Assist level: Supervision/Verbal cueing Assistive device: Walker-rolling Max distance: 17ft   Walk 10 feet activity   Assist     Assist level: Supervision/Verbal cueing Assistive device: Walker-rolling   Walk 50 feet activity   Assist Walk 50 feet with 2 turns activity did not occur: Safety/medical concerns (fatigue, pain)  Assist  level: Supervision/Verbal cueing Assistive device: Walker-rolling    Walk 150 feet activity   Assist Walk 150 feet activity did not occur: Safety/medical concerns (pain, fatigue)  Assist level: Supervision/Verbal cueing Assistive device: Walker-rolling    Walk 10 feet on uneven surface  activity   Assist Walk 10 feet on uneven surfaces activity did not occur: Safety/medical concerns (fatigue, pain)         Wheelchair     Assist Is the patient using a wheelchair?: Yes Type of Wheelchair: Manual    Wheelchair assist level: Dependent - Patient 0% Max wheelchair distance: >345ft    Wheelchair 50 feet with 2 turns activity    Assist        Assist Level: Dependent - Patient 0%   Wheelchair 150 feet activity     Assist      Assist Level: Dependent - Patient 0%   Blood pressure (!) 145/68, pulse 77, temperature 98.5 F (36.9 C), temperature source Oral, resp. rate 17, height 5' (1.524 m), weight 72.8 kg, SpO2 95%.  Medical Problem List and Plan: 1. Functional deficits secondary to R hip fracture s/p IM nail fixation- WBAT             -patient may  shower if ocver incisions             -ELOS/Goals: 10 days mod I to supervision Grounds pass ordered D/c Saturday  2. Right hip hematoma: d/c lovenx=ox, SCDs ordered             -antiplatelet therapy:  Plavix 3. Pain Management: Will schedule tylenol. Use ice prn.oxycodone prn. Aquathermia- cold ordered prn  - 10/12: Pain poorly controlled; schedule at bedtime Baclofen 5 mg  - 10/13: baclofen improved pain overnight; increase to 5 mg BID. Discussed scheduling oxy vs. Adding tramadol; patient does not wish to change regimen at this time  -discussed that oxycodone is helpful, continue current regimen  4. Insomnia: continue Xanax, discussed additional insomnia medications but she defers at this time.   5. Neuropsych/cognition: This patient is usually capable of making decisions on her own behalf. 6.  Extensive bruising of right lower extremity: decrease Lovenox to 30mg  7. Fluids/Electrolytes/Nutrition: Monitor I/O. Check CMET in am 8. Confusion/dizziness: On scoplamine patch likely post op-->discontinued             --will check orthostatic vitals. Limit narcotics.  9. OSA/Nocturnal hypoxia: Continue Oxygen at nights.  --Pulmonary hygiene during the day 10. CAD s/p stent: Treated medically with DAPT and  Crestor. Discussed her history of stent placement  11. Thrombocytopenia: Monitor for signs of bleeding.  12. ABLA: Improved post transfusion. Continue to monitor.              --has hx of iron deficiency in the past. Will add iron supplement.  13. Recurrent hypokalemia: Will continue supplement for a few days. Mg WNL 14. HTN/Hx of  atrial tachycardia: Continue Cardizem and metoprolol.    15. Anxiety/depression: continue Effexor XR with xanax at bedtime. Magnesium supplement added after supper  16. Hypotension: resolved, increase metoprolol to 50mg  BID, discussed with cardiology      10/08/2023    3:28 AM 10/07/2023    7:50 PM 10/07/2023    1:46 PM  Vitals with BMI  Systolic 145 101 098  Diastolic 68 61 59  Pulse 77 65 65    17. Skin irritation under breast: nystatin powder ordered, continue  18. Hypocalcemia: calcium supplement ordered, continue  19. Hypokalemia: additional klor 40 ordered 10/17  20. Constipation: psyllium ordered, senna ordered  21. Intertriginous dermatitis: clotrimazole ordered, continue  22. UTI: keflex ordered, f/u UA/UC    LOS: 10 days A FACE TO FACE EVALUATION WAS PERFORMED  Marquite Attwood P Atley Neubert 10/08/2023, 10:08 AM

## 2023-10-08 NOTE — Group Note (Signed)
Patient Details Name: LILYANNA REHM MRN: [id number] DOB: 06-Dec-1941 Today's Date: 10/08/2023  Time Calculation: OT Group Time Calculation OT Group Start Time: 1105 OT Group Stop Time: 1205 OT Group Time Calculation (min): 60 min      Group Description: TAG: Pt participate in group session with a focus on therapeutic activity of engaging in "connect 4" game to facilitate management of w/c parts,  improved hand-eye- coordination, increased UB/LB strength, dynamic standing balance, problem solving, standing tolerance, , improved social interaction, and increasing overall activity tolerance, pt completed task with RW and CGA. Pt required MIN verbal cues for hand placement when descending onto sitting surface. Pt able to reach out of BOS to collect pieces with no UE support and CGA. Pt transported back to room by RT.    Pain: No pain reported     Barron Schmid 10/08/2023, 3:12 PM

## 2023-10-08 NOTE — Plan of Care (Signed)
  Problem: RH Balance Goal: LTG Patient will maintain dynamic standing with ADLs (OT) Description: LTG:  Patient will maintain dynamic standing balance with assist during activities of daily living (OT)  Outcome: Completed/Met   Problem: Sit to Stand Goal: LTG:  Patient will perform sit to stand in prep for activites of daily living with assistance level (OT) Description: LTG:  Patient will perform sit to stand in prep for activites of daily living with assistance level (OT) Outcome: Completed/Met   Problem: RH Bathing Goal: LTG Patient will bathe all body parts with assist levels (OT) Description: LTG: Patient will bathe all body parts with assist levels (OT) Outcome: Completed/Met   Problem: RH Dressing Goal: LTG Patient will perform lower body dressing w/assist (OT) Description: LTG: Patient will perform lower body dressing with assist, with/without cues in positioning using equipment (OT) Outcome: Completed/Met   Problem: RH Toileting Goal: LTG Patient will perform toileting task (3/3 steps) with assistance level (OT) Description: LTG: Patient will perform toileting task (3/3 steps) with assistance level (OT)  Outcome: Completed/Met   Problem: RH Simple Meal Prep Goal: LTG Patient will perform simple meal prep w/assist (OT) Description: LTG: Patient will perform simple meal prep with assistance, with/without cues (OT). Outcome: Completed/Met   Problem: RH Toilet Transfers Goal: LTG Patient will perform toilet transfers w/assist (OT) Description: LTG: Patient will perform toilet transfers with assist, with/without cues using equipment (OT) Outcome: Completed/Met   Problem: RH Tub/Shower Transfers Goal: LTG Patient will perform tub/shower transfers w/assist (OT) Description: LTG: Patient will perform tub/shower transfers with assist, with/without cues using equipment (OT) Outcome: Completed/Met

## 2023-10-09 DIAGNOSIS — N390 Urinary tract infection, site not specified: Secondary | ICD-10-CM

## 2023-10-09 NOTE — Progress Notes (Signed)
PROGRESS NOTE   Subjective/Complaints:  Pt slept ok, ready to go. Pain well controlled. LBM yesterday per pt. Urinating fine. Denies any other complaints or concerns today. Meds reviewed.    ROS: +pain at site of hip fracture- improved, right hip- continues Positives per HPI above. Denies fevers, chills, N/V, abdominal pain, SOB, chest pain, new weakness or paraesthesias.  +extensive of bruising of right leg, +right hip hematoma- improved   Objective:   No results found. No results for input(s): "WBC", "HGB", "HCT", "PLT" in the last 72 hours.   Recent Labs    10/07/23 0626  NA 141  K 3.6  CL 102  CO2 29  GLUCOSE 100*  BUN 10  CREATININE 0.63  CALCIUM 8.5*     Intake/Output Summary (Last 24 hours) at 10/09/2023 1105 Last data filed at 10/08/2023 1829 Gross per 24 hour  Intake 680 ml  Output --  Net 680 ml         Physical Exam: Vital Signs Blood pressure (!) 134/53, pulse 69, temperature 97.7 F (36.5 C), temperature source Oral, resp. rate 18, height 5' (1.524 m), weight 72.8 kg, SpO2 95%. Gen: no distress, normal appearing. Sitting in bedside chair. HEENT: oral mucosa pink and moist, NCAT Cardio: reg rate, regular rhythm. No m/rg/ Chest: normal effort, normal rate of breathing. CTAB Abd: soft, non-distended, nonTTP, +BS throughout Ext: 1+ RLE edema to knee Psych: pleasant, normal affect  PRIOR EXAMS: Musculoskeletal:     Cervical back: Neck supple. No tenderness.     Comments: Ue's 5/5 B/L LLE 5-/5  RLE- HF 3-/5; knee 4-/5, otherwise 5-/5 distally +right hip hematoma- improved   Skin:    General: Skin is warm and dry.     Comments: Severe ecchymoses, purple/yellow bruising from R upper lateral hip to just below R lateral knee, 4/5 strength in RLE limited by pain Medial R knee swelling Incisions with minimal bleeding- have steristrips- dried blood around incisions - covered in clean dressing  stable 10/18  Intertriginous dermatitis under right breast  Neurological:  AAOx3  No apparent cognitive deficits  Moving all 4 extremities antigravity and against resistance  Assessment/Plan: 1. Functional deficits which require 3+ hours per day of interdisciplinary therapy in a comprehensive inpatient rehab setting. Physiatrist is providing close team supervision and 24 hour management of active medical problems listed below. Physiatrist and rehab team continue to assess barriers to discharge/monitor patient progress toward functional and medical goals  Care Tool:  Bathing    Body parts bathed by patient: Face, Left upper leg, Right upper leg, Buttocks, Front perineal area, Abdomen, Chest, Left arm, Right arm, Right lower leg, Left lower leg   Body parts bathed by helper: Right lower leg, Left lower leg     Bathing assist Assist Level: Independent with assistive device Assistive Device Comment: long handled sponge, TTB   Upper Body Dressing/Undressing Upper body dressing   What is the patient wearing?: Bra, Pull over shirt    Upper body assist Assist Level: Independent    Lower Body Dressing/Undressing Lower body dressing      What is the patient wearing?: Underwear/pull up, Pants     Lower body assist Assist  for lower body dressing: Independent with assitive device Assistive Device Comment: Research scientist (medical) assist Assist for toileting: Independent with assistive device     Transfers Chair/bed transfer  Transfers assist     Chair/bed transfer assist level: Independent with assistive device     Locomotion Ambulation   Ambulation assist      Assist level: Independent with assistive device Assistive device: Walker-rolling Max distance: 156ft   Walk 10 feet activity   Assist     Assist level: Independent with assistive device Assistive device: Walker-rolling   Walk 50 feet activity   Assist Walk 50 feet with 2  turns activity did not occur: Safety/medical concerns (fatigue, pain)  Assist level: Independent with assistive device Assistive device: Walker-rolling    Walk 150 feet activity   Assist Walk 150 feet activity did not occur: Safety/medical concerns (pain, fatigue)  Assist level: Independent with assistive device Assistive device: Walker-rolling    Walk 10 feet on uneven surface  activity   Assist Walk 10 feet on uneven surfaces activity did not occur: Safety/medical concerns (fatigue, pain)   Assist level: Supervision/Verbal cueing Assistive device: Walker-rolling   Wheelchair     Assist Is the patient using a wheelchair?: No Type of Wheelchair: Manual Wheelchair activity did not occur: N/A  Wheelchair assist level: Dependent - Patient 0% Max wheelchair distance: >343ft    Wheelchair 50 feet with 2 turns activity    Assist    Wheelchair 50 feet with 2 turns activity did not occur: N/A   Assist Level: Dependent - Patient 0%   Wheelchair 150 feet activity     Assist  Wheelchair 150 feet activity did not occur: N/A   Assist Level: Dependent - Patient 0%   Blood pressure (!) 134/53, pulse 69, temperature 97.7 F (36.5 C), temperature source Oral, resp. rate 18, height 5' (1.524 m), weight 72.8 kg, SpO2 95%.  Medical Problem List and Plan: 1. Functional deficits secondary to R hip fracture s/p IM nail fixation- WBAT             -patient may  shower if ocver incisions             -ELOS/Goals: 10 days mod I to supervision Grounds pass ordered D/c today 10/09/23  2. Right hip hematoma: d/c lovenox, SCDs ordered             -antiplatelet therapy:  Plavix 3. Pain Management: Will schedule tylenol. Use ice prn.oxycodone prn. Aquathermia- cold ordered prn  - 10/12: Pain poorly controlled; schedule at bedtime Baclofen 5 mg  - 10/13: baclofen improved pain overnight; increase to 5 mg BID. Discussed scheduling oxy vs. Adding tramadol; patient does not wish  to change regimen at this time  -discussed that oxycodone is helpful, continue current regimen  4. Insomnia: continue Xanax, discussed additional insomnia medications but she defers at this time.   5. Neuropsych/cognition: This patient is usually capable of making decisions on her own behalf. 6. Extensive bruising of right lower extremity: decrease Lovenox to 30mg  7. Fluids/Electrolytes/Nutrition: Monitor I/O. Check CMET in am 8. Confusion/dizziness: On scoplamine patch likely post op-->discontinued             --will check orthostatic vitals. Limit narcotics.  9. OSA/Nocturnal hypoxia: Continue Oxygen at nights.  --Pulmonary hygiene during the day 10. CAD s/p stent: Treated medically with DAPT and  Crestor. Discussed her history of stent placement  11. Thrombocytopenia: Monitor for signs of bleeding. Resolved 10/14 labs  12. ABLA: Improved post transfusion. Continue to monitor.              --has hx of iron deficiency in the past. Will add iron supplement.  13. Recurrent hypokalemia: Will continue supplement for a few days. Mg WNL. Resolved on 10/17 labs 14. HTN/Hx of atrial tachycardia: Continue Cardizem and metoprolol.    15. Anxiety/depression: continue Effexor XR with xanax at bedtime. Magnesium supplement added after supper  16. Hypotension: resolved, increase metoprolol to 50mg  BID, discussed with cardiology      10/09/2023    6:30 AM 10/08/2023    7:32 PM 10/08/2023    6:10 PM  Vitals with BMI  Systolic 134 144 161  Diastolic 53 73 54  Pulse 69 85 71    17. Skin irritation under breast: nystatin powder ordered, continue  18. Hypocalcemia: calcium supplement ordered, continue  19. Hypokalemia: additional klor 40 ordered 10/17  20. Constipation: psyllium ordered, senna ordered  21. Intertriginous dermatitis: clotrimazole ordered, continue  22. UTI: keflex ordered, f/u UA/UC  -10/09/23 UCx prelim report with >100k cfu e.coli, continue keflex, Pam will f/up on Monday  with susceptibilities.     LOS: 11 days A FACE TO FACE EVALUATION WAS PERFORMED  [id number] Kyrra Prada 10/09/2023, 11:05 AM

## 2023-10-09 NOTE — Progress Notes (Signed)
INPATIENT REHABILITATION DISCHARGE NOTE   Discharge instructions by: France Ravens PA  Verbalized understanding:yes  Skin care/Wound care healing? none  Pain:none  IV's:none  Tubes/Drains:none  O2:none  Safety instructions:none  Patient belongings:none  Discharged FA:OZHY  Discharged via: car Notes:

## 2023-10-09 NOTE — Progress Notes (Signed)
Inpatient Rehabilitation Discharge Medication Review by a Pharmacist    A complete drug regimen review was completed for this patient to identify any potential clinically significant medication issues.   High Risk Drug Classes Is patient taking? Indication by Medication  Antipsychotic No   Anticoagulant No   Antibiotic Yes Cephalexin-UTI  Opioid Yes PRN Oxycodone - severe pain  Antiplatelet Yes ASA, Clopidogrel - hx cardiac stents  Hypoglycemics/insulin No   Vasoactive Medication Yes Diltiazem CD, Metoprolol - hypertension, hx atrial tachycardia  Chemotherapy No    Other Yes Acetaminophen (scheduled), Baclofen - pain Melatonin - sleep Pantoprazole - GERD Rosuvastatin - hyperlipidemia Desvenlafaxine- anxiety/depression MVI , calcium/vit D, mag oxide- supplement Alpraxolam - anxiety Nystatin powder, clotrimaole creame-antifugal Docusate, psyllium-constipation        Type of Medication Issue Identified Description of Issue Recommendation(s)  Drug Interaction(s) (clinically significant)        Duplicate Therapy        Allergy        No Medication Administration End Date        Incorrect Dose        Additional Drug Therapy Needed        Significant med changes from prior encounter (inform family/care partners about these prior to discharge). Hydrochlorothiazide discontinued during inpatient admission.  Communicate changes with patient/family prior to discharge.  This medication is noted to stop taking on discharge orders/AVS.  Other   Therapeutic substitutions on Rehab admission(CIR): - pantoprazole for esomeprazole - venlafaxine for desvenlafaxine   Off Metformin; prescribed as outpatient but reported not yet starting. Discharged on home med Desvenlafaxine. Esomeprazole stopped and replaced with Protonix on discharge.       Metformin discontinued on discharge.       Clinically significant medication issues were identified that warrant physician communication and  completion of prescribed/recommended actions by midnight of the next day:  No      Pharmacist comments:   Time spent performing this drug regimen review (minutes):  25   Carrie Chung [facility] 10/09/2023 9:42 AM

## 2023-10-10 LAB — URINE CULTURE: Culture: >=100000 — AB

## 2023-10-12 ENCOUNTER — Ambulatory Visit: Payer: Medicare Other | Admitting: Occupational Therapy

## 2023-10-13 ENCOUNTER — Ambulatory Visit: Payer: Medicare Other | Admitting: Physical Therapy

## 2023-10-16 ENCOUNTER — Emergency Department (HOSPITAL_BASED_OUTPATIENT_CLINIC_OR_DEPARTMENT_OTHER): Payer: Medicare Other | Admitting: Radiology

## 2023-10-16 ENCOUNTER — Other Ambulatory Visit: Payer: Self-pay

## 2023-10-16 ENCOUNTER — Emergency Department (HOSPITAL_BASED_OUTPATIENT_CLINIC_OR_DEPARTMENT_OTHER)
Admission: EM | Admit: 2023-10-16 | Discharge: 2023-10-16 | Disposition: A | Discharge status: Home / Self Care | Payer: Medicare Other | Attending: Emergency Medicine | Admitting: Emergency Medicine

## 2023-10-16 ENCOUNTER — Emergency Department (HOSPITAL_BASED_OUTPATIENT_CLINIC_OR_DEPARTMENT_OTHER): Payer: Medicare Other

## 2023-10-16 ENCOUNTER — Encounter: Payer: Self-pay | Admitting: Physical Medicine and Rehabilitation

## 2023-10-16 ENCOUNTER — Encounter (HOSPITAL_BASED_OUTPATIENT_CLINIC_OR_DEPARTMENT_OTHER): Payer: Self-pay | Admitting: Emergency Medicine

## 2023-10-16 ENCOUNTER — Encounter: Payer: Self-pay | Admitting: Internal Medicine

## 2023-10-16 DIAGNOSIS — R531 Weakness: Secondary | ICD-10-CM

## 2023-10-16 DIAGNOSIS — R410 Disorientation, unspecified: Secondary | ICD-10-CM | POA: Diagnosis not present

## 2023-10-16 DIAGNOSIS — Z9889 Other specified postprocedural states: Secondary | ICD-10-CM | POA: Diagnosis not present

## 2023-10-16 DIAGNOSIS — Z7902 Long term (current) use of antithrombotics/antiplatelets: Secondary | ICD-10-CM | POA: Insufficient documentation

## 2023-10-16 DIAGNOSIS — R4182 Altered mental status, unspecified: Secondary | ICD-10-CM | POA: Diagnosis not present

## 2023-10-16 DIAGNOSIS — Z7982 Long term (current) use of aspirin: Secondary | ICD-10-CM | POA: Diagnosis not present

## 2023-10-16 DIAGNOSIS — R918 Other nonspecific abnormal finding of lung field: Secondary | ICD-10-CM | POA: Diagnosis not present

## 2023-10-16 DIAGNOSIS — Z87440 Personal history of urinary (tract) infections: Secondary | ICD-10-CM | POA: Insufficient documentation

## 2023-10-16 DIAGNOSIS — Z79899 Other long term (current) drug therapy: Secondary | ICD-10-CM | POA: Diagnosis not present

## 2023-10-16 DIAGNOSIS — J984 Other disorders of lung: Secondary | ICD-10-CM | POA: Diagnosis not present

## 2023-10-16 LAB — COMPREHENSIVE METABOLIC PANEL
ALT: 23 U/L (ref 0–44)
AST: 28 U/L (ref 15–41)
Albumin: 4.1 g/dL (ref 3.5–5.0)
Alkaline Phosphatase: 105 U/L (ref 38–126)
Anion gap: 10 (ref 5–15)
BUN: 8 mg/dL (ref 8–23)
CO2: 31 mmol/L (ref 22–32)
Calcium: 9.5 mg/dL (ref 8.9–10.3)
Chloride: 99 mmol/L (ref 98–111)
Creatinine, Ser: 0.58 mg/dL (ref 0.44–1.00)
GFR, Estimated: >60 mL/min (ref >60)
Glucose, Bld: 110 mg/dL — ABNORMAL HIGH (ref 70–99)
Potassium: 3.4 mmol/L — ABNORMAL LOW (ref 3.5–5.1)
Sodium: 140 mmol/L (ref 135–145)
Total Bilirubin: 0.6 mg/dL (ref 0.3–1.2)
Total Protein: 6.8 g/dL (ref 6.5–8.1)

## 2023-10-16 LAB — URINALYSIS, W/ REFLEX TO CULTURE (INFECTION SUSPECTED)
Bilirubin Urine: NEGATIVE
Glucose, UA: NEGATIVE mg/dL
Hgb urine dipstick: NEGATIVE
Ketones, ur: NEGATIVE mg/dL
Nitrite: NEGATIVE
Protein, ur: NEGATIVE mg/dL
Specific Gravity, Urine: 1.007 (ref 1.005–1.030)
pH: 6.5 (ref 5.0–8.0)

## 2023-10-16 LAB — CBC WITH DIFFERENTIAL/PLATELET
Abs Immature Granulocytes: 0 10*3/uL (ref 0.00–0.07)
Basophils Absolute: 0 10*3/uL (ref 0.0–0.1)
Basophils Relative: 1 %
Eosinophils Absolute: 0 10*3/uL (ref 0.0–0.5)
Eosinophils Relative: 0 %
HCT: 39.3 % (ref 36.0–46.0)
Hemoglobin: 12.6 g/dL (ref 12.0–15.0)
Immature Granulocytes: 0 %
Lymphocytes Relative: 25 %
Lymphs Abs: 1.1 10*3/uL (ref 0.7–4.0)
MCH: 29.2 pg (ref 26.0–34.0)
MCHC: 32.1 g/dL (ref 30.0–36.0)
MCV: 91 fL (ref 80.0–100.0)
Monocytes Absolute: 0.4 10*3/uL (ref 0.1–1.0)
Monocytes Relative: 9 %
Neutro Abs: 2.7 10*3/uL (ref 1.7–7.7)
Neutrophils Relative %: 65 %
Platelets: 179 10*3/uL (ref 150–400)
RBC: 4.32 MIL/uL (ref 3.87–5.11)
RDW: 15.8 % — ABNORMAL HIGH (ref 11.5–15.5)
WBC: 4.2 10*3/uL (ref 4.0–10.5)
nRBC: 0 % (ref 0.0–0.2)

## 2023-10-16 LAB — PROTIME-INR
INR: 1 (ref 0.8–1.2)
Prothrombin Time: 13.7 s (ref 11.4–15.2)

## 2023-10-16 LAB — LACTIC ACID, PLASMA: Lactic Acid, Venous: 1 mmol/L (ref 0.5–1.9)

## 2023-10-16 MED ORDER — POTASSIUM CHLORIDE CRYS ER 20 MEQ PO TBCR: 20.0000 meq | EXTENDED_RELEASE_TABLET | Freq: Once | ORAL | 0 refills | Status: DC

## 2023-10-16 NOTE — Telephone Encounter (Signed)
Spoke with daughter by phone and suggested ED evaluation.

## 2023-10-16 NOTE — ED Notes (Signed)
Patient to XR

## 2023-10-16 NOTE — Telephone Encounter (Signed)
Daughter was called and suggested Carrie Chung be taken to [facility] as she was groggy and may have taken too many meds or perhaps had a urinary infection

## 2023-10-16 NOTE — ED Triage Notes (Signed)
Post op hip surgery, recently out of rehab, confusion at home and reported low spo2 (91%) May have taken the wrong meds Finished keflex for UTI friday

## 2023-10-16 NOTE — Discharge Instructions (Addendum)
1.  Discontinue melatonin and baclofen. 2.  Resume your Pristiq as previously taken. 3.  Try to decrease the frequency of your use of oxycodone (Percocet).  If your pain is reasonably controlled, start to substitute extra strength Tylenol up to every 6 hours as needed. 4.  You may continue your Xanax as you have always taken it in the evening. 5.  Instead of taking your metoprolol 50 mg at 1 time, change this to a 25 mg tablet twice a day at about a 12-hour spacing if possible.  Continue your previously prescribed Cardizem. 6.  Start taking hydrochlorothiazide again. 7.  Contact your cardiologist about resuming Plavix.  Also they will need to monitor your blood pressure with these adjustments to your metoprolol and the hydrochlorothiazide.  You had been given a short prescription for potassium.  Your potassium was on the low side.  This needs to be carefully monitored and adjusted as needed in accordance with your medications and levels. 8.  There is a small nodule on your chest x-ray.  This is unlikely to be related to any current symptoms or issues however needs further evaluation to make sure it is not an early detected lung cancer or other serious problem. 9.  In addition to following up with your cardiologist, follow-up with your family doctor for overall management of your medications and medical problems.

## 2023-10-16 NOTE — ED Provider Notes (Signed)
Carrie Chung EMERGENCY DEPARTMENT AT [facility] Provider Note   CSN: [id number] Arrival date & time: 10/16/23  1309     History  Chief Complaint  Patient presents with   Altered Mental Status    Carrie Chung is a 82 y.o. female.  HPI   82 year old female presents the emergency department accompanied by family member with concern for worsening confusion.  Patient recently had a mechanical fall with right hip injury, was just discharged from rehab to home with home PT and care.  Was diagnosed with a UTI at the facility and completed Keflex yesterday.  She has had some lingering confusion that today was felt to be worse.  The family member describes this as the patient not understanding how to use her phone like she typically would.  The patient admits that she feels confused at times.  Of note there has been addition of medications like oxycodone, baclofen, melatonin in addition to her as needed Xanax.  The patient has a pillbox and the family member noted that the medication boxes were empty through Sunday so there was concern of additional doses.  Otherwise there is been no documented fever, fall, head injury.  Home Medications Prior to Admission medications   Medication Sig Start Date End Date Taking? Authorizing Provider  acetaminophen (TYLENOL) 325 MG tablet Take 2 tablets (650 mg total) by mouth 4 (four) times daily -  with meals and at bedtime. 10/07/23   Love, Evlyn Kanner, PA-C  alprazolam Prudy Feeler) 2 MG tablet TAKE 1 TABLET(2 MG) BY MOUTH AT BEDTIME AS NEEDED FOR SLEEP 06/16/23   Margaree Mackintosh, MD  alum & mag hydroxide-simeth (MAALOX/MYLANTA) 200-200-20 MG/5ML suspension Take 30 mLs by mouth every 4 (four) hours as needed for indigestion. 09/28/23   Alwyn Ren, MD  aspirin 81 MG tablet Take 1 tablet (81 mg total) by mouth daily. 10/09/23   Love, Evlyn Kanner, PA-C  Baclofen 5 MG TABS Take 1 tablet (5 mg total) by mouth 2 (two) times daily. 10/07/23   Love, Evlyn Kanner,  PA-C  Calcium Citrate-Vitamin D (CITRUS CALCIUM/VITAMIN D) 200-6.25 MG-MCG TABS Take 200 mg of elemental calcium by mouth daily after supper. 10/08/23   Love, Evlyn Kanner, PA-C  cephALEXin (KEFLEX) 500 MG capsule Take 1 capsule (500 mg total) by mouth every 12 (twelve) hours. 10/08/23   Love, Evlyn Kanner, PA-C  vitamin D3 (CHOLECALCIFEROL) 25 MCG tablet Take 1 tablet (1,000 Units total) by mouth daily after supper. 10/08/23   Love, Evlyn Kanner, PA-C  clopidogrel (PLAVIX) 75 MG tablet TAKE 1 TABLET(75 MG) BY MOUTH DAILY 06/15/23   Tonny Bollman, MD  clotrimazole (LOTRIMIN) 1 % cream Apply topically 2 (two) times daily. 10/07/23   Love, Evlyn Kanner, PA-C  desvenlafaxine (PRISTIQ) 50 MG 24 hr tablet TAKE 1 TABLET(50 MG) BY MOUTH DAILY 06/25/23   Worthy Rancher B, FNP  diltiazem (CARDIZEM CD) 240 MG 24 hr capsule Take 1 capsule (240 mg total) by mouth daily. 08/20/23   Tonny Bollman, MD  docusate sodium (COLACE) 100 MG capsule Take 1 capsule (100 mg total) by mouth daily. 10/07/23   Love, Evlyn Kanner, PA-C  magnesium oxide (MAG-OX) 400 MG tablet Take 0.5 tablets (200 mg total) by mouth daily after supper. 10/07/23   Love, Evlyn Kanner, PA-C  melatonin 3 MG TABS tablet Take 1 tablet (3 mg total) by mouth at bedtime. 10/07/23   Love, Evlyn Kanner, PA-C  metoprolol tartrate (LOPRESSOR) 50 MG tablet Take 1 tablet (50 mg total)  by mouth 2 (two) times daily. 10/08/23   Love, Evlyn Kanner, PA-C  nystatin (MYCOSTATIN/NYSTOP) powder Apply topically 2 (two) times daily. 10/07/23   Love, Evlyn Kanner, PA-C  oxyCODONE (OXY IR/ROXICODONE) 5 MG immediate release tablet Take 1 tablet (5 mg total) by mouth every 6 (six) hours as needed for severe pain (pain score 7-10). 10/07/23   Love, Evlyn Kanner, PA-C  pantoprazole (PROTONIX) 40 MG tablet Take 2 tablets (80 mg total) by mouth daily at 12 noon. 10/07/23   Love, Evlyn Kanner, PA-C  psyllium (HYDROCIL/METAMUCIL) 95 % PACK Take 1 packet by mouth daily. 10/08/23   Love, Evlyn Kanner, PA-C  rosuvastatin (CRESTOR)  20 MG tablet TAKE 1 TABLET(20 MG) BY MOUTH DAILY 06/15/23   Tonny Bollman, MD      Allergies    Penicillins, Sulfonamide derivatives, Lactose intolerance (gi), and Sulfamethoxazole    Review of Systems   Review of Systems  Constitutional:  Positive for fatigue. Negative for fever.  Respiratory:  Negative for shortness of breath.   Cardiovascular:  Negative for chest pain.  Gastrointestinal:  Negative for abdominal pain, diarrhea and vomiting.  Skin:  Negative for rash.  Neurological:  Negative for facial asymmetry, speech difficulty, weakness, numbness and headaches.  Psychiatric/Behavioral:  Positive for confusion.     Physical Exam Updated Vital Signs BP (!) 172/74   Pulse 66   Temp 99 F (37.2 C)   Resp 18   SpO2 96%  Physical Exam Vitals and nursing note reviewed.  Constitutional:      General: She is not in acute distress.    Appearance: Normal appearance.  HENT:     Head: Normocephalic.     Mouth/Throat:     Mouth: Mucous membranes are moist.  Eyes:     Extraocular Movements: Extraocular movements intact.     Pupils: Pupils are equal, round, and reactive to light.  Cardiovascular:     Rate and Rhythm: Normal rate.  Pulmonary:     Effort: Pulmonary effort is normal. No respiratory distress.  Abdominal:     Palpations: Abdomen is soft.     Tenderness: There is no abdominal tenderness.  Skin:    General: Skin is warm.  Neurological:     General: No focal deficit present.     Mental Status: She is alert and oriented to person, place, and time.     Cranial Nerves: No cranial nerve deficit.     Comments: Seems slightly confused at times but is otherwise oriented  Psychiatric:        Mood and Affect: Mood normal.     ED Results / Procedures / Treatments   Labs (all labs ordered are listed, but only abnormal results are displayed) Labs Reviewed  COMPREHENSIVE METABOLIC PANEL - Abnormal; Notable for the following components:      Result Value   Potassium  3.4 (*)    Glucose, Bld 110 (*)    All other components within normal limits  CBC WITH DIFFERENTIAL/PLATELET - Abnormal; Notable for the following components:   RDW 15.8 (*)    All other components within normal limits  CULTURE, BLOOD (ROUTINE X 2)  CULTURE, BLOOD (ROUTINE X 2)  LACTIC ACID, PLASMA  PROTIME-INR  LACTIC ACID, PLASMA  URINALYSIS, W/ REFLEX TO CULTURE (INFECTION SUSPECTED)    EKG None  Radiology No results found.  Procedures Procedures    Medications Ordered in ED Medications - No data to display  ED Course/ Medical Decision Making/ A&P  Medical Decision Making Amount and/or Complexity of Data Reviewed Labs: ordered. Radiology: ordered.   82 year old female presents to the emergency department with ongoing/worsening confusion.  No other acute symptoms, facial droop or focal weakness in regards to possible strokelike symptoms.  Of note there has been multiple medications added to her regimen including oxycodone, baclofen in addition to her Xanax and melatonin with possible additional doses.  Concern for polypharmacy.  Recent UTI completed Keflex yesterday.  Afebrile.  She is slightly confused at times but otherwise seems neuro intact.  Will evaluate from a metabolic standpoint, rule out ongoing UTI and obtain a head CT.    Initial blood work shows no leukocytosis, kidney function is normal, no acute electrolyte abnormalities.  We are pending urinalysis and head CT. Patient signed out.        Final Clinical Impression(s) / ED Diagnoses Final diagnoses:  None    Rx / DC Orders ED Discharge Orders     None         Rozelle Logan, DO 10/16/23 1454

## 2023-10-16 NOTE — ED Notes (Signed)
Patient assisted from wheelchair to bed with 2 person assist. Patient tolerated well.

## 2023-10-16 NOTE — ED Provider Notes (Signed)
F/u CT head and UA. Recent UTI tx with keflex finished Friday. Confusion. Question polypharmacy aspect. 24 hour CNA care.   CT scan does not show acute intracranial findings.  Chest x-ray recommendation by radiology is for follow-up for a small nodule.  Urinalysis is negative.  I have reviewed all of the diagnostic results with the patient and her daughter at bedside.  We extensively discussed the patient's medication list.  At this time plan will be to discontinue baclofen and melatonin.  Patient had previously been on Pristiq for depression longstanding.  She will resume this.  Patient is also for long-term taken Xanax in the evenings.  She will continue this as prescribed.  Counseled on trying to diminish the oxycodone use for pain control as tolerated.  Patient's blood pressures are elevated in the emergency department.  She has had some more recent changes.  At this time we will have her split her metoprolol dose to 25 mg twice a day rather than a immediate release once a day.  She will resume hydrochlorothiazide which she had previously been on.  Her renal function is normal.  She has very mild hypokalemia will supplement.  She will be in contact with Dr. Excell Seltzer for management of resumption of Plavix in the setting of more recent hematoma and continued management of blood pressure medications and potassium.   Arby Barrette, MD 10/16/23 [phone]

## 2023-10-17 ENCOUNTER — Telehealth (HOSPITAL_BASED_OUTPATIENT_CLINIC_OR_DEPARTMENT_OTHER): Payer: Self-pay | Admitting: *Deleted

## 2023-10-17 ENCOUNTER — Telehealth (HOSPITAL_BASED_OUTPATIENT_CLINIC_OR_DEPARTMENT_OTHER): Payer: Self-pay

## 2023-10-17 LAB — BLOOD CULTURE ID PANEL (REFLEXED) - BCID2

## 2023-10-17 NOTE — ED Notes (Addendum)
10/17/2023, 1644: This RN received call from lab/ micro at [facility]. Advised that 1/4 pt's blood culture bottles was + for gram+ cocci, staph epidermidis. Current EDP- Carrie Pandy, DO- notified of same. EDP advised "it was probably a contaminant." No new orders at this time.

## 2023-10-18 ENCOUNTER — Telehealth: Payer: Self-pay

## 2023-10-18 ENCOUNTER — Encounter: Payer: Self-pay | Admitting: Cardiovascular Disease

## 2023-10-18 ENCOUNTER — Telehealth (INDEPENDENT_AMBULATORY_CARE_PROVIDER_SITE_OTHER): Payer: Medicare Other | Admitting: Internal Medicine

## 2023-10-18 DIAGNOSIS — S72001D Fracture of unspecified part of neck of right femur, subsequent encounter for closed fracture with routine healing: Secondary | ICD-10-CM | POA: Diagnosis not present

## 2023-10-18 DIAGNOSIS — R7302 Impaired glucose tolerance (oral): Secondary | ICD-10-CM | POA: Diagnosis not present

## 2023-10-18 DIAGNOSIS — I10 Essential (primary) hypertension: Secondary | ICD-10-CM | POA: Diagnosis not present

## 2023-10-18 DIAGNOSIS — F419 Anxiety disorder, unspecified: Secondary | ICD-10-CM | POA: Diagnosis not present

## 2023-10-18 DIAGNOSIS — F439 Reaction to severe stress, unspecified: Secondary | ICD-10-CM | POA: Diagnosis not present

## 2023-10-18 DIAGNOSIS — Z7901 Long term (current) use of anticoagulants: Secondary | ICD-10-CM | POA: Diagnosis not present

## 2023-10-18 DIAGNOSIS — I252 Old myocardial infarction: Secondary | ICD-10-CM

## 2023-10-18 DIAGNOSIS — F32A Depression, unspecified: Secondary | ICD-10-CM

## 2023-10-18 DIAGNOSIS — Z955 Presence of coronary angioplasty implant and graft: Secondary | ICD-10-CM | POA: Diagnosis not present

## 2023-10-18 DIAGNOSIS — R410 Disorientation, unspecified: Secondary | ICD-10-CM | POA: Diagnosis not present

## 2023-10-18 LAB — CULTURE, BLOOD (ROUTINE X 2)

## 2023-10-18 NOTE — Telephone Encounter (Signed)
Pt seen at [facility] ED on 10/16/23 for increasing confusion post mechanical fall. Per ED notes: She has very mild hypokalemia will supplement. She will be in contact with Dr. Excell Seltzer for management of resumption of Plavix in the setting of more recent hematoma and continued management of blood pressure medications and potassium.  Advised pt to restart Crestor. Routing to [employer] for advisement on Plavix.

## 2023-10-18 NOTE — Progress Notes (Signed)
Patient Care Team: Margaree Mackintosh, MD as PCP - General (Internal Medicine) Tonny Bollman, MD as PCP - Cardiology (Cardiology) Lenord Fellers Luanna Cole, MD (Internal Medicine)  I connected with Carrie Chung on 10/18/23 at 5:45 PM by video enabled telemedicine visit and verified that I am speaking with the correct person using two identifiers.   I discussed the limitations, risks, security and privacy concerns of performing an evaluation and management service by telemedicine and the availability of in-person appointments. I also discussed with the patient that there may be a patient responsible charge related to this service. The patient expressed understanding and agreed to proceed.   Other persons participating in the visit and their role in the encounter: Medical scribe, Doylene Bode  Patient's location: Home  Provider's location: Clinic   I provided 30 minutes of non face-to-face telephone visit time during this encounter, and > 50% was spent counseling as documented under my assessment & plan. She is identified using two identifiers, Carrie Chung, a patient of this practice. She is in her home and I am in my practice. She is agreeable to using this format today.  Chief Complaint: hospital/ED follow-up   Subjective:    Patient ID: Carrie Chung , Female    DOB: 1941/04/04, 82 y.o.    MRN: [id number]   82 y.o. Female presents today for a hospital/ED follow-up.   Seen in ED on 10/16/23 for worsening confusion. Her daughter contacted [place] on 10/16/23 with concerns regarding confusion and was advised to go to the ED. Patient was keeping her medications at her bedside table at her request and her daughter believes she may have mixed up the doses. Had previously finished course of Keflex on 10/15/23 for UTI. Urinalysis negative. Potassium low at 3.4. Lactic acid normal. 10/16/23 chest X-ray showed: 1) No acute cardiopulmonary findings, 2) Indeterminate nodular density projecting over  the thoracic spine. Lesion not placed on comparison radiograph 2021. Consider CT of the thorax to exclude pulmonary nodule. 10/16/23 CT head negative for acute intracranial process. Blood culture showed staphylococcus epidermidis. Given potassium chloride 20 meq.   She recently suffered a mechanical fall with closed fracture of right hip. Underwent right intramedullary femoral nail fixation on 09/21/23 by orthopedist, Dr. Margarita Rana.  Past Medical History:  Diagnosis Date   Atrial tachycardia (HCC)    a. noted at cardiac rehab 5/13; event monitor ordered to assess for AFib   CAD (coronary artery disease)    a. NSTEMI 03/2012 ([facility]HC 03/27/12: pLAD 30%, mLAD 99%, mCFX 95%, mRCA 99% with thrombus, EF 65%);  s/p PTCA/DESx1 to prox-mid RCA 03/27/12 urgently in setting of hypotension/bradycardia, with staged PTCA/Evolve study stent to mid LAD & PTCA/Evolve study stent to prox LCx 03/29/12 ;   echo 03/28/12:EF 60%, Aortic sclerosis without AS, mild RVE, mild reduced RVSF     Endometrial polyp    HTN (hypertension)    Hyperlipidemia    MI, old    Sleep related hypoxia      Family History  Problem Relation Age of Onset   Coronary artery disease Mother        CABG at age 82, PPM   Colon cancer Father    Cancer - Other Father        larnyx    Social History   Social History Narrative   Caffeine 2 cups tea.      Review of Systems  Constitutional:  Negative for fever and malaise/fatigue.  HENT:  Negative for congestion.  Eyes:  Negative for blurred vision.  Respiratory:  Negative for cough and shortness of breath.   Cardiovascular:  Negative for chest pain, palpitations and leg swelling.  Gastrointestinal:  Negative for vomiting.  Musculoskeletal:  Negative for back pain.  Skin:  Negative for rash.  Neurological:  Negative for loss of consciousness and headaches.        Objective:   Vitals: There were no vitals taken for this visit.   Physical Exam Nursing note reviewed.   Pulmonary:     Effort: Pulmonary effort is normal.  Neurological:     Mental Status: She is alert and oriented to person, place, and time. Mental status is at baseline.  Psychiatric:        Mood and Affect: Mood normal.        Behavior: Behavior normal.        Thought Content: Thought content normal.        Judgment: Judgment normal.       Results:   Studies obtained and personally reviewed by me:  10/16/23 chest X-ray showed: 1) No acute cardiopulmonary findings, 2) Indeterminate nodular density projecting over the thoracic spine. Lesion not placed on comparison radiograph 2021. Consider CT of the thorax to exclude pulmonary nodule.   10/16/23 CT head negative for acute intracranial process.  Labs:       Component Value Date/Time   NA 140 10/16/2023 1331   NA 142 05/13/2022 1142   K 3.4 (L) 10/16/2023 1331   CL 99 10/16/2023 1331   CO2 31 10/16/2023 1331   GLUCOSE 110 (H) 10/16/2023 1331   BUN 8 10/16/2023 1331   BUN 11 05/13/2022 1142   CREATININE 0.58 10/16/2023 1331   CREATININE 0.78 08/30/2023 0926   CALCIUM 9.5 10/16/2023 1331   PROT 6.8 10/16/2023 1331   PROT 6.7 12/29/2017 1427   ALBUMIN 4.1 10/16/2023 1331   ALBUMIN 4.2 12/29/2017 1427   AST 28 10/16/2023 1331   ALT 23 10/16/2023 1331   ALKPHOS 105 10/16/2023 1331   BILITOT 0.6 10/16/2023 1331   BILITOT 0.3 12/29/2017 1427   GFRNONAA >60 10/16/2023 1331   GFRNONAA 68 08/16/2020 1156   GFRAA 79 08/16/2020 1156     Lab Results  Component Value Date   WBC 4.2 10/16/2023   HGB 12.6 10/16/2023   HCT 39.3 10/16/2023   MCV 91.0 10/16/2023   PLT 179 10/16/2023    Lab Results  Component Value Date   CHOL 182 08/30/2023   HDL 88 08/30/2023   LDLCALC 69 08/30/2023   LDLDIRECT 152.4 03/09/2012   TRIG 172 (H) 08/30/2023   CHOLHDL 2.1 08/30/2023    Lab Results  Component Value Date   HGBA1C 6.6 (H) 08/30/2023     Lab Results  Component Value Date   TSH 2.85 08/30/2023      Assessment & Plan:    Confusion: Stop Baclofen. She will be monitored by her nursing staff and daughter in her home and her medication bottles will be watched more closely. Her daughter will be in touch as necessary.  Anxiety and depression: Continue Pristiq, alprazolam.    I,Alexander Ruley,acting as a scribe for Margaree Mackintosh, MD.,have documented all relevant documentation on the behalf of Margaree Mackintosh, MD,as directed by  Margaree Mackintosh, MD while in the presence of Margaree Mackintosh, MD.   ***

## 2023-10-18 NOTE — Telephone Encounter (Signed)
Transition Care Management Follow-up Telephone Call Date of discharge and from where: 10/08/23 [facility] Emergency Department at [facility]   How have you been since you were released from the hospital? Any questions or concerns? No   Items Reviewed: Did the pt receive and understand the discharge instructions provided?Medications obtained and verified? Yes Other?  Any new allergies since your discharge? No Dietary orders reviewed? Yes Do you have support at home? [facility] and Equipment/Supplies: Were home health services ordered? Yes If so, what is the name of the agency?  [facility]  Has the agency set up a time to come to the patient's home? Yes Were any new equipment or medical supplies ordered? no What is the name of the medical supply agency? n/a   Were you able to get the supplies/equipment? n/a Do you have any questions related to the use of the equipment or supplies? n/a   Functional Questionnaire: (I = Independent and D = Dependent) ADLs: I   Bathing/Dressing- I   Meal Prep- I   Eating-  I  Maintaining continence-  I    Transferring/Ambulation- I   Managing Meds- I    Follow up appointments reviewed:   PCP Hospital f/u appt confirmed? Yes, 10/18/23 Specialist Hospital f/u appt confirmed? n/a Are transportation arrangements needed? No If their condition worsens, is the pt aware to call PCP or go to the Emergency Dept.? Yes Was the patient provided with contact information for the PCP's office or ED? Yes Was to pt encouraged to call back with questions or concerns? Yes

## 2023-10-19 ENCOUNTER — Encounter: Payer: Self-pay | Admitting: Internal Medicine

## 2023-10-19 ENCOUNTER — Telehealth (HOSPITAL_BASED_OUTPATIENT_CLINIC_OR_DEPARTMENT_OTHER): Payer: Self-pay | Admitting: *Deleted

## 2023-10-19 NOTE — Telephone Encounter (Signed)
Post ED Visit - Positive Culture Follow-up  Culture report reviewed by antimicrobial stewardship pharmacist: [facility] []  Daylene Posey, Pharm.D. []  Celedonio Miyamoto, Pharm.D., BCPS AQ-ID []  Garvin Fila, Pharm.D., BCPS []  Georgina Pillion, [address].D., BCPS []  [place], [place].D., BCPS, AAHIVP []  Estella Husk, Pharm.D., BCPS, AAHIVP []  Lysle Pearl, PharmD, BCPS []  Phillips Climes, PharmD, BCPS []  Agapito Games, PharmD, BCPS []  Verlan Friends, PharmD []  Mervyn Gay, PharmD, BCPS []  Vinnie Level, PharmD  [facility] []  Len Childs, PharmD []  Greer Pickerel, PharmD []  Adalberto Cole, PharmD []  Perlie Gold, Rph []  Lonell Face) Jean Rosenthal, PharmD []  Earl Many, PharmD []  Junita Push, PharmD []  Dorna Leitz, PharmD []  Terrilee Files, PharmD []  Lynann Beaver, PharmD []  Keturah Barre, PharmD []  Loralee Pacas, PharmD []  Bernadene Person, PharmD   Positive blood culture Called to MD 10/27, likely contaminant and no further patient follow-up is required at this time.  Carrie Chung 10/19/2023, 8:08 AM

## 2023-10-19 NOTE — Patient Instructions (Addendum)
Confusion has resolved. Patient speaking coherently on video visit today. Unable to travel to office due to hip fracture. Receiving home health services and PT. Daughter will monitor medications carefully and not leave them at bedside. ED visit recently to rule out sepsis, electrolyte imbalance, arrhythmia or stroke.ED Notes reviewed.Has Cardiology follow up with Dr. Excell Seltzer in November.Stop Baclofen.

## 2023-10-20 SURGERY — FIXATION, FRACTURE, INTERTROCHANTERIC, WITH INTRAMEDULLARY ROD
Anesthesia: General | Laterality: Right

## 2023-10-21 LAB — CULTURE, BLOOD (ROUTINE X 2)
Culture: NO GROWTH
Special Requests: ADEQUATE

## 2023-11-08 ENCOUNTER — Telehealth: Payer: Self-pay | Admitting: Pulmonary Disease

## 2023-11-08 ENCOUNTER — Ambulatory Visit: Payer: Medicare Other | Attending: Cardiovascular Disease | Admitting: Cardiovascular Disease

## 2023-11-08 ENCOUNTER — Encounter: Payer: Self-pay | Admitting: Cardiovascular Disease

## 2023-11-08 VITALS — BP 138/80 | HR 86 | Ht 60.0 in | Wt 146.0 lb

## 2023-11-08 DIAGNOSIS — I10 Essential (primary) hypertension: Secondary | ICD-10-CM | POA: Diagnosis not present

## 2023-11-08 DIAGNOSIS — E782 Mixed hyperlipidemia: Secondary | ICD-10-CM | POA: Insufficient documentation

## 2023-11-08 DIAGNOSIS — I25119 Atherosclerotic heart disease of native coronary artery with unspecified angina pectoris: Secondary | ICD-10-CM | POA: Diagnosis not present

## 2023-11-08 DIAGNOSIS — J9611 Chronic respiratory failure with hypoxia: Secondary | ICD-10-CM

## 2023-11-08 MED ORDER — CLOPIDOGREL BISULFATE 75 MG PO TABS: ORAL_TABLET | ORAL | 3 refills | Status: AC

## 2023-11-08 MED ORDER — ROSUVASTATIN CALCIUM 20 MG PO TABS: ORAL_TABLET | ORAL | 3 refills | Status: DC

## 2023-11-08 MED ORDER — METOPROLOL TARTRATE 50 MG PO TABS: 50.0000 mg | ORAL_TABLET | Freq: Two times a day (BID) | ORAL | 3 refills | Status: DC

## 2023-11-08 MED ORDER — POTASSIUM CHLORIDE ER 10 MEQ PO TBCR: 10.0000 meq | EXTENDED_RELEASE_TABLET | Freq: Every day | ORAL | 3 refills | Status: DC

## 2023-11-08 NOTE — Telephone Encounter (Signed)
I do not see an open order for a ONO

## 2023-11-08 NOTE — Telephone Encounter (Signed)
Patient needs pulse oximetry overnight to be scheduled before her next appointment with Dr.Alva. Call back number [phone]

## 2023-11-08 NOTE — Patient Instructions (Signed)
Medication Instructions:  STOP Aspirin STOP Diltiazem  RESUME Clopidogrel/Plavix 75 mg daily RESUME Rosuvastatin/Crestor 20mg  daily INCREASE Metoprolol Tartrate/Lopressor to 50mg  twice daily DECREASE Potassium Chloride/Klor-Con to daily  Follow-Up: At [facility], you and your health needs are our priority.  As part of our continuing mission to provide you with exceptional heart care, we have created designated Provider Care Teams.  These Care Teams include your primary Cardiologist (physician) and Advanced Practice Providers (APPs -  Physician Assistants and Nurse Practitioners) who all work together to provide you with the care you need, when you need it.  Your next appointment:   6 month(s)  Provider:   Tonny Bollman, MD

## 2023-11-08 NOTE — Assessment & Plan Note (Signed)
She will resume rosuvastatin 20 mg daily.  I reviewed her LFTs drawn in the hospital and they were normal.

## 2023-11-08 NOTE — Progress Notes (Signed)
Cardiology Office Note:    Date:  11/08/2023   ID:  Carrie, Chung 1941/01/14, MRN [id number]  PCP:  Margaree Mackintosh, MD   [facility] Providers Cardiologist:  Tonny Bollman, MD     Referring MD: Margaree Mackintosh, MD   Chief Complaint  Patient presents with   Coronary Artery Disease    History of Present Illness:    Carrie Chung is a 82 y.o. female with a hx of coronary artery disease, presenting for follow-up evaluation. The patient initially presented with non-ST elevation infarct in 2013.  She was found to have multivessel coronary artery disease and underwent multivessel stenting at that time.  She has been managed medically since then with no recurrent ischemic events.  When I saw her last in May 2024 she was complaining of nocturnal angina.  I ordered a Lexiscan Myoview stress test that showed no ischemia and medical management was continued.  She had a mechanical fall September 20, 2023 and sustained hip fracture.  She ended up requiring inpatient rehab and then had another emergency room evaluation for altered mental status which was felt to be due to polypharmacy.  She did not require readmission to the hospital.  Many of her cardiac medications have been reduced or held.  The patient is here with her daughter today.  Her daughter has been managing all of her medications.  She has followed the instructions given to her by various healthcare providers about the patient's medications.  She is no longer taking amlodipine, rosuvastatin, or clopidogrel.  She is taking a reduced dose of metoprolol.  She denies any recent chest pain or pressure.  No shortness of breath, edema, orthopnea, or PND.  She feels like her cognitive dysfunction is slowly improving.     Current Medications: Current Meds  Medication Sig   acetaminophen (TYLENOL) 325 MG tablet Take 2 tablets (650 mg total) by mouth 4 (four) times daily -  with meals and at bedtime.   alprazolam (XANAX) 2 MG  tablet TAKE 1 TABLET(2 MG) BY MOUTH AT BEDTIME AS NEEDED FOR SLEEP   Calcium Citrate-Vitamin D (CITRUS CALCIUM/VITAMIN D) 200-6.25 MG-MCG TABS Take 200 mg of elemental calcium by mouth daily after supper.   desvenlafaxine (PRISTIQ) 50 MG 24 hr tablet TAKE 1 TABLET(50 MG) BY MOUTH DAILY   esomeprazole (NEXIUM) 40 MG packet Take 40 mg by mouth daily before breakfast.   hydrochlorothiazide (HYDRODIURIL) 25 MG tablet Take 25 mg by mouth daily.   magnesium oxide (MAG-OX) 400 MG tablet Take 0.5 tablets (200 mg total) by mouth daily after supper.   metoprolol tartrate (LOPRESSOR) 50 MG tablet Take 1 tablet (50 mg total) by mouth 2 (two) times daily.   oxyCODONE (OXY IR/ROXICODONE) 5 MG immediate release tablet Take 1 tablet (5 mg total) by mouth every 6 (six) hours as needed for severe pain (pain score 7-10).   potassium chloride (KLOR-CON) 10 MEQ tablet Take 1 tablet (10 mEq total) by mouth daily.   vitamin D3 (CHOLECALCIFEROL) 25 MCG tablet Take 1 tablet (1,000 Units total) by mouth daily after supper.   [DISCONTINUED] aspirin 81 MG tablet Take 1 tablet (81 mg total) by mouth daily.   [DISCONTINUED] metoprolol tartrate (LOPRESSOR) 50 MG tablet Take 1 tablet (50 mg total) by mouth 2 (two) times daily.     Allergies:   Penicillins, Sulfonamide derivatives, Lactose intolerance (gi), and Sulfamethoxazole   ROS:   Please see the history of present illness.    All  other systems reviewed and are negative.  EKGs/Labs/Other Studies Reviewed:    The following studies were reviewed today: Cardiac Studies & Procedures     STRESS TESTS  MYOCARDIAL PERFUSION IMAGING 05/20/2023  Narrative   The study is normal. The study is low risk.   No ST deviation was noted.   LV perfusion is normal. There is no evidence of ischemia. There is no evidence of infarction.   Left ventricular function is normal. Nuclear stress EF: 61 %. The left ventricular ejection fraction is normal (55-65%). End diastolic cavity size  is normal. End systolic cavity size is normal.     MONITORS  LONG TERM MONITOR (3-14 DAYS) 06/01/2023  Narrative Patch Wear Time:  2 days and 23 hours (2024-05-25T09:36:44-0400 to 2024-05-28T08:38:40-0400)  Patient had a min HR of 57 bpm, max HR of 197 bpm, and avg HR of 77 bpm. Predominant underlying rhythm was Sinus Rhythm. Slight P wave morphology changes were noted. 65 Supraventricular Tachycardia runs occurred, the run with the fastest interval lasting 4 beats with a max rate of 197 bpm, the longest lasting 16 beats with an avg rate of 115 bpm. Isolated SVEs were frequent (9.6%, 16109), SVE Couplets were occasional (1.1%, 1793), and SVE Triplets were rare (<1.0%, 413). Isolated VEs were rare (<1.0%), VE Couplets were rare (<1.0%), and no VE Triplets were present.  Summary: The basic rhythm is normal sinus with an average HR of 77 bpm No atrial fibrillation or flutter No high-grade heart block or pathologic pauses There are rare PVC's and frequent supraventricular beats without sustained arrhythmias. PAC burden 10%, short supraventricular runs, no sustained run.           EKG:        Recent Labs: 08/30/2023: TSH 2.85 09/29/2023: Magnesium 2.1 10/16/2023: ALT 23; BUN 8; Creatinine, Ser 0.58; Hemoglobin 12.6; Platelets 179; Potassium 3.4; Sodium 140  Recent Lipid Panel    Component Value Date/Time   CHOL 182 08/30/2023 0926   TRIG 172 (H) 08/30/2023 0926   HDL 88 08/30/2023 0926   CHOLHDL 2.1 08/30/2023 0926   VLDL 41 (H) 03/25/2017 1102   LDLCALC 69 08/30/2023 0926   LDLDIRECT 152.4 03/09/2012 0900     Risk Assessment/Calculations:                Physical Exam:    VS:  BP 138/80 (BP Location: Right Arm, Patient Position: Sitting, Cuff Size: Normal)   Pulse 86   Ht 5' (1.524 m)   Wt 146 lb (66.2 kg)   SpO2 98%   BMI 28.51 kg/m     Wt Readings from Last 3 Encounters:  11/08/23 146 lb (66.2 kg)  10/04/23 160 lb 7.9 oz (72.8 kg)  09/20/23 157 lb 6.5 oz (71.4  kg)     GEN:  Well nourished, well developed in no acute distress HEENT: Normal NECK: No JVD; No carotid bruits LYMPHATICS: No lymphadenopathy CARDIAC: RRR, no murmurs, rubs, gallops RESPIRATORY:  Clear to auscultation without rales, wheezing or rhonchi  ABDOMEN: Soft, non-tender, non-distended MUSCULOSKELETAL:  No edema; No deformity  SKIN: Warm and dry NEUROLOGIC:  Alert and oriented x 3 PSYCHIATRIC:  Normal affect   Assessment & Plan Coronary artery disease involving native coronary artery of native heart with angina pectoris [facility]) The patient is clinically stable.  She had remote multivessel stenting.  She will start back on clopidogrel.  I asked her to discontinue aspirin as I think antiplatelet monotherapy is probably safest for her moving forward.  Fortunately  she is having no angina at present on her antianginal drug regimen. Mixed hyperlipidemia She will resume rosuvastatin 20 mg daily.  I reviewed her LFTs drawn in the hospital and they were normal. Essential hypertension Blood pressure appears controlled on hydrochlorothiazide.  Will add back K-Dur 10 mill equivalents daily.  She will go back to her previous beta-blocker dose.  Will keep her off of amlodipine for now.  Previous metoprolol dose was 50 mg twice daily.  Overall the patient appears to be slowly improving.  I will see her back in 6 months for follow-up evaluation.       Medication Adjustments/Labs and Tests Ordered: Current medicines are reviewed at length with the patient today.  Concerns regarding medicines are outlined above.  No orders of the defined types were placed in this encounter.  Meds ordered this encounter  Medications   clopidogrel (PLAVIX) 75 MG tablet    Sig: TAKE 1 TABLET(75 MG) BY MOUTH DAILY    Dispense:  90 tablet    Refill:  3   rosuvastatin (CRESTOR) 20 MG tablet    Sig: TAKE 1 TABLET(20 MG) BY MOUTH DAILY    Dispense:  90 tablet    Refill:  3   potassium chloride (KLOR-CON) 10 MEQ  tablet    Sig: Take 1 tablet (10 mEq total) by mouth daily.    Dispense:  90 tablet    Refill:  3    Dose DECREASE   metoprolol tartrate (LOPRESSOR) 50 MG tablet    Sig: Take 1 tablet (50 mg total) by mouth 2 (two) times daily.    Dispense:  180 tablet    Refill:  3    Patient Instructions  Medication Instructions:  STOP Aspirin STOP Diltiazem  RESUME Clopidogrel/Plavix 75 mg daily RESUME Rosuvastatin/Crestor 20mg  daily INCREASE Metoprolol Tartrate/Lopressor to 50mg  twice daily DECREASE Potassium Chloride/Klor-Con to daily  Follow-Up: At [facility], you and your health needs are our priority.  As part of our continuing mission to provide you with exceptional heart care, we have created designated Provider Care Teams.  These Care Teams include your primary Cardiologist (physician) and Advanced Practice Providers (APPs -  Physician Assistants and Nurse Practitioners) who all work together to provide you with the care you need, when you need it.  Your next appointment:   6 month(s)  Provider:   Tonny Bollman, MD        Signed, Tonny Bollman, MD  11/08/2023 5:31 PM    [facility]

## 2023-11-09 NOTE — Telephone Encounter (Signed)
Carrie Chung has been replaced. Please schedule for next available.

## 2023-11-15 DIAGNOSIS — S72141D Displaced intertrochanteric fracture of right femur, subsequent encounter for closed fracture with routine healing: Secondary | ICD-10-CM | POA: Diagnosis not present

## 2023-11-22 ENCOUNTER — Encounter: Payer: Medicare Other | Admitting: Physical Medicine and Rehabilitation

## 2023-12-02 ENCOUNTER — Telehealth: Payer: Self-pay | Admitting: Pulmonary Disease

## 2023-12-02 NOTE — Telephone Encounter (Signed)
Pt requesting to speak with a nurse

## 2023-12-03 DIAGNOSIS — F32A Depression, unspecified: Secondary | ICD-10-CM | POA: Diagnosis not present

## 2023-12-03 DIAGNOSIS — Z7902 Long term (current) use of antithrombotics/antiplatelets: Secondary | ICD-10-CM | POA: Diagnosis not present

## 2023-12-03 DIAGNOSIS — I2510 Atherosclerotic heart disease of native coronary artery without angina pectoris: Secondary | ICD-10-CM | POA: Diagnosis not present

## 2023-12-03 DIAGNOSIS — S72141D Displaced intertrochanteric fracture of right femur, subsequent encounter for closed fracture with routine healing: Secondary | ICD-10-CM | POA: Diagnosis not present

## 2023-12-03 DIAGNOSIS — J9611 Chronic respiratory failure with hypoxia: Secondary | ICD-10-CM | POA: Diagnosis not present

## 2023-12-03 DIAGNOSIS — I4719 Other supraventricular tachycardia: Secondary | ICD-10-CM | POA: Diagnosis not present

## 2023-12-03 DIAGNOSIS — Z9181 History of falling: Secondary | ICD-10-CM | POA: Diagnosis not present

## 2023-12-03 DIAGNOSIS — I252 Old myocardial infarction: Secondary | ICD-10-CM | POA: Diagnosis not present

## 2023-12-03 DIAGNOSIS — E785 Hyperlipidemia, unspecified: Secondary | ICD-10-CM | POA: Diagnosis not present

## 2023-12-03 DIAGNOSIS — Z87891 Personal history of nicotine dependence: Secondary | ICD-10-CM | POA: Diagnosis not present

## 2023-12-03 DIAGNOSIS — I10 Essential (primary) hypertension: Secondary | ICD-10-CM | POA: Diagnosis not present

## 2023-12-03 DIAGNOSIS — Z955 Presence of coronary angioplasty implant and graft: Secondary | ICD-10-CM | POA: Diagnosis not present

## 2023-12-03 DIAGNOSIS — F419 Anxiety disorder, unspecified: Secondary | ICD-10-CM | POA: Diagnosis not present

## 2023-12-03 DIAGNOSIS — G3184 Mild cognitive impairment, so stated: Secondary | ICD-10-CM | POA: Diagnosis not present

## 2023-12-03 DIAGNOSIS — Z79891 Long term (current) use of opiate analgesic: Secondary | ICD-10-CM | POA: Diagnosis not present

## 2023-12-03 DIAGNOSIS — M25511 Pain in right shoulder: Secondary | ICD-10-CM | POA: Diagnosis not present

## 2023-12-03 DIAGNOSIS — Z8781 Personal history of (healed) traumatic fracture: Secondary | ICD-10-CM | POA: Diagnosis not present

## 2023-12-03 DIAGNOSIS — E119 Type 2 diabetes mellitus without complications: Secondary | ICD-10-CM | POA: Diagnosis not present

## 2023-12-07 DIAGNOSIS — E119 Type 2 diabetes mellitus without complications: Secondary | ICD-10-CM | POA: Diagnosis not present

## 2023-12-07 DIAGNOSIS — S72141D Displaced intertrochanteric fracture of right femur, subsequent encounter for closed fracture with routine healing: Secondary | ICD-10-CM | POA: Diagnosis not present

## 2023-12-07 DIAGNOSIS — I2510 Atherosclerotic heart disease of native coronary artery without angina pectoris: Secondary | ICD-10-CM | POA: Diagnosis not present

## 2023-12-07 DIAGNOSIS — I252 Old myocardial infarction: Secondary | ICD-10-CM | POA: Diagnosis not present

## 2023-12-07 DIAGNOSIS — J9611 Chronic respiratory failure with hypoxia: Secondary | ICD-10-CM | POA: Diagnosis not present

## 2023-12-07 DIAGNOSIS — I10 Essential (primary) hypertension: Secondary | ICD-10-CM | POA: Diagnosis not present

## 2023-12-07 NOTE — Telephone Encounter (Signed)
Spoke with patient. She states that she had broken her hip and had to have surgery. She states that during this time they did not give her oxygen. She states that her oxygen levels dropped and were low for quite some time and she experienced some memory loss. She states that after coming home she had some memory loss and her handwriting was awful. She does state that it is improving but wants to know if it will continue to improve. She isnt sure why they didn't give her oxygen while she was out since she needs it when she sleeps. He doesn't think its neurological but she doesn't want this to be her new normal. Please advise your thoughts.

## 2023-12-07 NOTE — Telephone Encounter (Signed)
She does not think she needs referral to neurologist. Just wants to know if this will continue to improve.

## 2023-12-07 NOTE — Telephone Encounter (Signed)
ONO scheduled for early next year.

## 2023-12-09 DIAGNOSIS — S72141D Displaced intertrochanteric fracture of right femur, subsequent encounter for closed fracture with routine healing: Secondary | ICD-10-CM | POA: Diagnosis not present

## 2023-12-09 DIAGNOSIS — E119 Type 2 diabetes mellitus without complications: Secondary | ICD-10-CM | POA: Diagnosis not present

## 2023-12-09 DIAGNOSIS — I2510 Atherosclerotic heart disease of native coronary artery without angina pectoris: Secondary | ICD-10-CM | POA: Diagnosis not present

## 2023-12-09 DIAGNOSIS — J9611 Chronic respiratory failure with hypoxia: Secondary | ICD-10-CM | POA: Diagnosis not present

## 2023-12-09 DIAGNOSIS — I10 Essential (primary) hypertension: Secondary | ICD-10-CM | POA: Diagnosis not present

## 2023-12-09 DIAGNOSIS — I252 Old myocardial infarction: Secondary | ICD-10-CM | POA: Diagnosis not present

## 2023-12-20 DIAGNOSIS — I10 Essential (primary) hypertension: Secondary | ICD-10-CM | POA: Diagnosis not present

## 2023-12-20 DIAGNOSIS — J9611 Chronic respiratory failure with hypoxia: Secondary | ICD-10-CM | POA: Diagnosis not present

## 2023-12-20 DIAGNOSIS — E119 Type 2 diabetes mellitus without complications: Secondary | ICD-10-CM | POA: Diagnosis not present

## 2023-12-20 DIAGNOSIS — S72141D Displaced intertrochanteric fracture of right femur, subsequent encounter for closed fracture with routine healing: Secondary | ICD-10-CM | POA: Diagnosis not present

## 2023-12-20 DIAGNOSIS — I252 Old myocardial infarction: Secondary | ICD-10-CM | POA: Diagnosis not present

## 2023-12-20 DIAGNOSIS — I2510 Atherosclerotic heart disease of native coronary artery without angina pectoris: Secondary | ICD-10-CM | POA: Diagnosis not present

## 2023-12-23 DIAGNOSIS — S72141D Displaced intertrochanteric fracture of right femur, subsequent encounter for closed fracture with routine healing: Secondary | ICD-10-CM | POA: Diagnosis not present

## 2023-12-23 DIAGNOSIS — I252 Old myocardial infarction: Secondary | ICD-10-CM | POA: Diagnosis not present

## 2023-12-23 DIAGNOSIS — I10 Essential (primary) hypertension: Secondary | ICD-10-CM | POA: Diagnosis not present

## 2023-12-23 DIAGNOSIS — E119 Type 2 diabetes mellitus without complications: Secondary | ICD-10-CM | POA: Diagnosis not present

## 2023-12-23 DIAGNOSIS — I2510 Atherosclerotic heart disease of native coronary artery without angina pectoris: Secondary | ICD-10-CM | POA: Diagnosis not present

## 2023-12-23 DIAGNOSIS — J9611 Chronic respiratory failure with hypoxia: Secondary | ICD-10-CM | POA: Diagnosis not present

## 2023-12-28 DIAGNOSIS — I10 Essential (primary) hypertension: Secondary | ICD-10-CM | POA: Diagnosis not present

## 2023-12-28 DIAGNOSIS — I252 Old myocardial infarction: Secondary | ICD-10-CM | POA: Diagnosis not present

## 2023-12-28 DIAGNOSIS — J9611 Chronic respiratory failure with hypoxia: Secondary | ICD-10-CM | POA: Diagnosis not present

## 2023-12-28 DIAGNOSIS — S72141D Displaced intertrochanteric fracture of right femur, subsequent encounter for closed fracture with routine healing: Secondary | ICD-10-CM | POA: Diagnosis not present

## 2023-12-28 DIAGNOSIS — E119 Type 2 diabetes mellitus without complications: Secondary | ICD-10-CM | POA: Diagnosis not present

## 2023-12-28 DIAGNOSIS — I2510 Atherosclerotic heart disease of native coronary artery without angina pectoris: Secondary | ICD-10-CM | POA: Diagnosis not present

## 2024-01-02 DIAGNOSIS — Z9181 History of falling: Secondary | ICD-10-CM | POA: Diagnosis not present

## 2024-01-02 DIAGNOSIS — F419 Anxiety disorder, unspecified: Secondary | ICD-10-CM | POA: Diagnosis not present

## 2024-01-02 DIAGNOSIS — I10 Essential (primary) hypertension: Secondary | ICD-10-CM | POA: Diagnosis not present

## 2024-01-02 DIAGNOSIS — M25511 Pain in right shoulder: Secondary | ICD-10-CM | POA: Diagnosis not present

## 2024-01-02 DIAGNOSIS — I252 Old myocardial infarction: Secondary | ICD-10-CM | POA: Diagnosis not present

## 2024-01-02 DIAGNOSIS — F32A Depression, unspecified: Secondary | ICD-10-CM | POA: Diagnosis not present

## 2024-01-02 DIAGNOSIS — E119 Type 2 diabetes mellitus without complications: Secondary | ICD-10-CM | POA: Diagnosis not present

## 2024-01-02 DIAGNOSIS — I4719 Other supraventricular tachycardia: Secondary | ICD-10-CM | POA: Diagnosis not present

## 2024-01-02 DIAGNOSIS — Z87891 Personal history of nicotine dependence: Secondary | ICD-10-CM | POA: Diagnosis not present

## 2024-01-02 DIAGNOSIS — G3184 Mild cognitive impairment, so stated: Secondary | ICD-10-CM | POA: Diagnosis not present

## 2024-01-02 DIAGNOSIS — Z79891 Long term (current) use of opiate analgesic: Secondary | ICD-10-CM | POA: Diagnosis not present

## 2024-01-02 DIAGNOSIS — I2510 Atherosclerotic heart disease of native coronary artery without angina pectoris: Secondary | ICD-10-CM | POA: Diagnosis not present

## 2024-01-02 DIAGNOSIS — J9611 Chronic respiratory failure with hypoxia: Secondary | ICD-10-CM | POA: Diagnosis not present

## 2024-01-02 DIAGNOSIS — Z955 Presence of coronary angioplasty implant and graft: Secondary | ICD-10-CM | POA: Diagnosis not present

## 2024-01-02 DIAGNOSIS — Z8781 Personal history of (healed) traumatic fracture: Secondary | ICD-10-CM | POA: Diagnosis not present

## 2024-01-02 DIAGNOSIS — E785 Hyperlipidemia, unspecified: Secondary | ICD-10-CM | POA: Diagnosis not present

## 2024-01-02 DIAGNOSIS — S72141D Displaced intertrochanteric fracture of right femur, subsequent encounter for closed fracture with routine healing: Secondary | ICD-10-CM | POA: Diagnosis not present

## 2024-01-02 DIAGNOSIS — Z7902 Long term (current) use of antithrombotics/antiplatelets: Secondary | ICD-10-CM | POA: Diagnosis not present

## 2024-01-03 ENCOUNTER — Other Ambulatory Visit: Payer: Self-pay | Admitting: Internal Medicine

## 2024-01-03 DIAGNOSIS — S72141D Displaced intertrochanteric fracture of right femur, subsequent encounter for closed fracture with routine healing: Secondary | ICD-10-CM | POA: Diagnosis not present

## 2024-01-03 DIAGNOSIS — I10 Essential (primary) hypertension: Secondary | ICD-10-CM | POA: Diagnosis not present

## 2024-01-03 DIAGNOSIS — I2510 Atherosclerotic heart disease of native coronary artery without angina pectoris: Secondary | ICD-10-CM | POA: Diagnosis not present

## 2024-01-03 DIAGNOSIS — E119 Type 2 diabetes mellitus without complications: Secondary | ICD-10-CM | POA: Diagnosis not present

## 2024-01-03 DIAGNOSIS — J9611 Chronic respiratory failure with hypoxia: Secondary | ICD-10-CM | POA: Diagnosis not present

## 2024-01-03 DIAGNOSIS — I252 Old myocardial infarction: Secondary | ICD-10-CM | POA: Diagnosis not present

## 2024-01-03 NOTE — Telephone Encounter (Signed)
 Last refill 06/16/23 #30 + 2 refills

## 2024-01-04 DIAGNOSIS — S72141D Displaced intertrochanteric fracture of right femur, subsequent encounter for closed fracture with routine healing: Secondary | ICD-10-CM | POA: Diagnosis not present

## 2024-01-05 DIAGNOSIS — I2510 Atherosclerotic heart disease of native coronary artery without angina pectoris: Secondary | ICD-10-CM | POA: Diagnosis not present

## 2024-01-05 DIAGNOSIS — I252 Old myocardial infarction: Secondary | ICD-10-CM | POA: Diagnosis not present

## 2024-01-05 DIAGNOSIS — S72141D Displaced intertrochanteric fracture of right femur, subsequent encounter for closed fracture with routine healing: Secondary | ICD-10-CM | POA: Diagnosis not present

## 2024-01-05 DIAGNOSIS — I10 Essential (primary) hypertension: Secondary | ICD-10-CM | POA: Diagnosis not present

## 2024-01-05 DIAGNOSIS — E119 Type 2 diabetes mellitus without complications: Secondary | ICD-10-CM | POA: Diagnosis not present

## 2024-01-05 DIAGNOSIS — J9611 Chronic respiratory failure with hypoxia: Secondary | ICD-10-CM | POA: Diagnosis not present

## 2024-01-10 DIAGNOSIS — J9611 Chronic respiratory failure with hypoxia: Secondary | ICD-10-CM | POA: Diagnosis not present

## 2024-01-10 DIAGNOSIS — E119 Type 2 diabetes mellitus without complications: Secondary | ICD-10-CM | POA: Diagnosis not present

## 2024-01-10 DIAGNOSIS — I10 Essential (primary) hypertension: Secondary | ICD-10-CM | POA: Diagnosis not present

## 2024-01-10 DIAGNOSIS — I2510 Atherosclerotic heart disease of native coronary artery without angina pectoris: Secondary | ICD-10-CM | POA: Diagnosis not present

## 2024-01-10 DIAGNOSIS — I252 Old myocardial infarction: Secondary | ICD-10-CM | POA: Diagnosis not present

## 2024-01-10 DIAGNOSIS — S72141D Displaced intertrochanteric fracture of right femur, subsequent encounter for closed fracture with routine healing: Secondary | ICD-10-CM | POA: Diagnosis not present

## 2024-01-11 NOTE — Telephone Encounter (Signed)
ONO order was received by [facility] but the order needs to be signed by the doctor.

## 2024-01-13 DIAGNOSIS — E119 Type 2 diabetes mellitus without complications: Secondary | ICD-10-CM | POA: Diagnosis not present

## 2024-01-13 DIAGNOSIS — I252 Old myocardial infarction: Secondary | ICD-10-CM | POA: Diagnosis not present

## 2024-01-13 DIAGNOSIS — I2510 Atherosclerotic heart disease of native coronary artery without angina pectoris: Secondary | ICD-10-CM | POA: Diagnosis not present

## 2024-01-13 DIAGNOSIS — S72141D Displaced intertrochanteric fracture of right femur, subsequent encounter for closed fracture with routine healing: Secondary | ICD-10-CM | POA: Diagnosis not present

## 2024-01-13 DIAGNOSIS — I10 Essential (primary) hypertension: Secondary | ICD-10-CM | POA: Diagnosis not present

## 2024-01-13 DIAGNOSIS — J9611 Chronic respiratory failure with hypoxia: Secondary | ICD-10-CM | POA: Diagnosis not present

## 2024-01-18 DIAGNOSIS — E119 Type 2 diabetes mellitus without complications: Secondary | ICD-10-CM | POA: Diagnosis not present

## 2024-01-18 DIAGNOSIS — S72141D Displaced intertrochanteric fracture of right femur, subsequent encounter for closed fracture with routine healing: Secondary | ICD-10-CM | POA: Diagnosis not present

## 2024-01-18 DIAGNOSIS — J9611 Chronic respiratory failure with hypoxia: Secondary | ICD-10-CM | POA: Diagnosis not present

## 2024-01-18 DIAGNOSIS — I2510 Atherosclerotic heart disease of native coronary artery without angina pectoris: Secondary | ICD-10-CM | POA: Diagnosis not present

## 2024-01-18 DIAGNOSIS — I10 Essential (primary) hypertension: Secondary | ICD-10-CM | POA: Diagnosis not present

## 2024-01-18 DIAGNOSIS — I252 Old myocardial infarction: Secondary | ICD-10-CM | POA: Diagnosis not present

## 2024-01-21 DIAGNOSIS — I10 Essential (primary) hypertension: Secondary | ICD-10-CM | POA: Diagnosis not present

## 2024-01-21 DIAGNOSIS — J9611 Chronic respiratory failure with hypoxia: Secondary | ICD-10-CM | POA: Diagnosis not present

## 2024-01-21 DIAGNOSIS — E119 Type 2 diabetes mellitus without complications: Secondary | ICD-10-CM | POA: Diagnosis not present

## 2024-01-21 DIAGNOSIS — I252 Old myocardial infarction: Secondary | ICD-10-CM | POA: Diagnosis not present

## 2024-01-21 DIAGNOSIS — I2510 Atherosclerotic heart disease of native coronary artery without angina pectoris: Secondary | ICD-10-CM | POA: Diagnosis not present

## 2024-01-21 DIAGNOSIS — S72141D Displaced intertrochanteric fracture of right femur, subsequent encounter for closed fracture with routine healing: Secondary | ICD-10-CM | POA: Diagnosis not present

## 2024-01-24 DIAGNOSIS — E119 Type 2 diabetes mellitus without complications: Secondary | ICD-10-CM | POA: Diagnosis not present

## 2024-01-24 DIAGNOSIS — I2510 Atherosclerotic heart disease of native coronary artery without angina pectoris: Secondary | ICD-10-CM | POA: Diagnosis not present

## 2024-01-24 DIAGNOSIS — S72141D Displaced intertrochanteric fracture of right femur, subsequent encounter for closed fracture with routine healing: Secondary | ICD-10-CM | POA: Diagnosis not present

## 2024-01-24 DIAGNOSIS — J9611 Chronic respiratory failure with hypoxia: Secondary | ICD-10-CM | POA: Diagnosis not present

## 2024-01-24 DIAGNOSIS — I10 Essential (primary) hypertension: Secondary | ICD-10-CM | POA: Diagnosis not present

## 2024-01-24 DIAGNOSIS — I252 Old myocardial infarction: Secondary | ICD-10-CM | POA: Diagnosis not present

## 2024-01-26 ENCOUNTER — Telehealth: Payer: Self-pay | Admitting: Pulmonary Disease

## 2024-01-26 NOTE — Telephone Encounter (Signed)
 Patient has an appointment scheduled for February 12 which is next Wednesday. [facility] has yet to receive the signed order for the ONO. Please sign and return to [facility].

## 2024-01-29 DIAGNOSIS — I2510 Atherosclerotic heart disease of native coronary artery without angina pectoris: Secondary | ICD-10-CM | POA: Diagnosis not present

## 2024-01-29 DIAGNOSIS — E119 Type 2 diabetes mellitus without complications: Secondary | ICD-10-CM | POA: Diagnosis not present

## 2024-01-29 DIAGNOSIS — J9611 Chronic respiratory failure with hypoxia: Secondary | ICD-10-CM | POA: Diagnosis not present

## 2024-01-29 DIAGNOSIS — S72141D Displaced intertrochanteric fracture of right femur, subsequent encounter for closed fracture with routine healing: Secondary | ICD-10-CM | POA: Diagnosis not present

## 2024-01-29 DIAGNOSIS — I252 Old myocardial infarction: Secondary | ICD-10-CM | POA: Diagnosis not present

## 2024-01-29 DIAGNOSIS — I10 Essential (primary) hypertension: Secondary | ICD-10-CM | POA: Diagnosis not present

## 2024-02-01 ENCOUNTER — Telehealth: Payer: Self-pay | Admitting: Internal Medicine

## 2024-02-01 DIAGNOSIS — Z7902 Long term (current) use of antithrombotics/antiplatelets: Secondary | ICD-10-CM | POA: Diagnosis not present

## 2024-02-01 DIAGNOSIS — Z9181 History of falling: Secondary | ICD-10-CM | POA: Diagnosis not present

## 2024-02-01 DIAGNOSIS — E785 Hyperlipidemia, unspecified: Secondary | ICD-10-CM | POA: Diagnosis not present

## 2024-02-01 DIAGNOSIS — F419 Anxiety disorder, unspecified: Secondary | ICD-10-CM | POA: Diagnosis not present

## 2024-02-01 DIAGNOSIS — E119 Type 2 diabetes mellitus without complications: Secondary | ICD-10-CM | POA: Diagnosis not present

## 2024-02-01 DIAGNOSIS — Z79891 Long term (current) use of opiate analgesic: Secondary | ICD-10-CM | POA: Diagnosis not present

## 2024-02-01 DIAGNOSIS — S72141D Displaced intertrochanteric fracture of right femur, subsequent encounter for closed fracture with routine healing: Secondary | ICD-10-CM | POA: Diagnosis not present

## 2024-02-01 DIAGNOSIS — I2510 Atherosclerotic heart disease of native coronary artery without angina pectoris: Secondary | ICD-10-CM | POA: Diagnosis not present

## 2024-02-01 DIAGNOSIS — I252 Old myocardial infarction: Secondary | ICD-10-CM | POA: Diagnosis not present

## 2024-02-01 DIAGNOSIS — G3184 Mild cognitive impairment, so stated: Secondary | ICD-10-CM | POA: Diagnosis not present

## 2024-02-01 DIAGNOSIS — Z8781 Personal history of (healed) traumatic fracture: Secondary | ICD-10-CM | POA: Diagnosis not present

## 2024-02-01 DIAGNOSIS — I10 Essential (primary) hypertension: Secondary | ICD-10-CM | POA: Diagnosis not present

## 2024-02-01 DIAGNOSIS — J9611 Chronic respiratory failure with hypoxia: Secondary | ICD-10-CM | POA: Diagnosis not present

## 2024-02-01 DIAGNOSIS — Z87891 Personal history of nicotine dependence: Secondary | ICD-10-CM | POA: Diagnosis not present

## 2024-02-01 DIAGNOSIS — I4719 Other supraventricular tachycardia: Secondary | ICD-10-CM | POA: Diagnosis not present

## 2024-02-01 DIAGNOSIS — F32A Depression, unspecified: Secondary | ICD-10-CM | POA: Diagnosis not present

## 2024-02-01 DIAGNOSIS — Z955 Presence of coronary angioplasty implant and graft: Secondary | ICD-10-CM | POA: Diagnosis not present

## 2024-02-01 NOTE — Telephone Encounter (Signed)
Copied from CRM [id number]. Topic: Clinical - Home Health Verbal Orders >> Feb 01, 2024 12:04 PM Geroge Baseman wrote: Caller/Agency: [facility]/ Renne Musca Number: [phone] Service Requested: Physical Therapy Frequency: 1 w 9, beginning this week, to continue gait training and balance Any new concerns about the patient? No, notes she's is slowly progressing.  Please leave a message if she does not answer. >> Feb 01, 2024  1:57 PM CMA Laverle Patter B wrote: Not our patient

## 2024-02-01 NOTE — Telephone Encounter (Signed)
Called Cecelia back to let her know we have not been signing off on the physical therapy orders and she said she knew that, however the patient had said that she had been back to the ortho doctor and he said he did not need to see her any more so she was just going to change and have Dr Lenord Fellers sign off on them now. I let her know we would need an  office visit first and it would be like starting over, but normally the ortho doctor continues to sign off on the orders as long as patient needs them. Cecelia is going to check with [facility] office and if they want sign off on them she will let me know.

## 2024-02-02 ENCOUNTER — Ambulatory Visit (HOSPITAL_BASED_OUTPATIENT_CLINIC_OR_DEPARTMENT_OTHER): Payer: Medicare Other | Admitting: Pulmonary Disease

## 2024-02-03 DIAGNOSIS — E119 Type 2 diabetes mellitus without complications: Secondary | ICD-10-CM | POA: Diagnosis not present

## 2024-02-03 DIAGNOSIS — J9611 Chronic respiratory failure with hypoxia: Secondary | ICD-10-CM | POA: Diagnosis not present

## 2024-02-03 DIAGNOSIS — M25561 Pain in right knee: Secondary | ICD-10-CM | POA: Diagnosis not present

## 2024-02-03 DIAGNOSIS — S72141D Displaced intertrochanteric fracture of right femur, subsequent encounter for closed fracture with routine healing: Secondary | ICD-10-CM | POA: Diagnosis not present

## 2024-02-03 DIAGNOSIS — I2510 Atherosclerotic heart disease of native coronary artery without angina pectoris: Secondary | ICD-10-CM | POA: Diagnosis not present

## 2024-02-03 DIAGNOSIS — I252 Old myocardial infarction: Secondary | ICD-10-CM | POA: Diagnosis not present

## 2024-02-03 DIAGNOSIS — I10 Essential (primary) hypertension: Secondary | ICD-10-CM | POA: Diagnosis not present

## 2024-02-05 DIAGNOSIS — M25561 Pain in right knee: Secondary | ICD-10-CM | POA: Diagnosis not present

## 2024-02-07 DIAGNOSIS — M25561 Pain in right knee: Secondary | ICD-10-CM | POA: Diagnosis not present

## 2024-02-08 DIAGNOSIS — R0902 Hypoxemia: Secondary | ICD-10-CM | POA: Diagnosis not present

## 2024-02-08 DIAGNOSIS — G4730 Sleep apnea, unspecified: Secondary | ICD-10-CM | POA: Diagnosis not present

## 2024-02-11 DIAGNOSIS — I10 Essential (primary) hypertension: Secondary | ICD-10-CM | POA: Diagnosis not present

## 2024-02-11 DIAGNOSIS — S72141D Displaced intertrochanteric fracture of right femur, subsequent encounter for closed fracture with routine healing: Secondary | ICD-10-CM | POA: Diagnosis not present

## 2024-02-11 DIAGNOSIS — E119 Type 2 diabetes mellitus without complications: Secondary | ICD-10-CM | POA: Diagnosis not present

## 2024-02-11 DIAGNOSIS — J9611 Chronic respiratory failure with hypoxia: Secondary | ICD-10-CM | POA: Diagnosis not present

## 2024-02-11 DIAGNOSIS — I252 Old myocardial infarction: Secondary | ICD-10-CM | POA: Diagnosis not present

## 2024-02-11 DIAGNOSIS — I2510 Atherosclerotic heart disease of native coronary artery without angina pectoris: Secondary | ICD-10-CM | POA: Diagnosis not present

## 2024-02-16 DIAGNOSIS — S72141D Displaced intertrochanteric fracture of right femur, subsequent encounter for closed fracture with routine healing: Secondary | ICD-10-CM | POA: Diagnosis not present

## 2024-02-16 DIAGNOSIS — I252 Old myocardial infarction: Secondary | ICD-10-CM | POA: Diagnosis not present

## 2024-02-16 DIAGNOSIS — I10 Essential (primary) hypertension: Secondary | ICD-10-CM | POA: Diagnosis not present

## 2024-02-16 DIAGNOSIS — J9611 Chronic respiratory failure with hypoxia: Secondary | ICD-10-CM | POA: Diagnosis not present

## 2024-02-16 DIAGNOSIS — I2510 Atherosclerotic heart disease of native coronary artery without angina pectoris: Secondary | ICD-10-CM | POA: Diagnosis not present

## 2024-02-16 DIAGNOSIS — E119 Type 2 diabetes mellitus without complications: Secondary | ICD-10-CM | POA: Diagnosis not present

## 2024-02-23 DIAGNOSIS — E119 Type 2 diabetes mellitus without complications: Secondary | ICD-10-CM | POA: Diagnosis not present

## 2024-02-23 DIAGNOSIS — I252 Old myocardial infarction: Secondary | ICD-10-CM | POA: Diagnosis not present

## 2024-02-23 DIAGNOSIS — J9611 Chronic respiratory failure with hypoxia: Secondary | ICD-10-CM | POA: Diagnosis not present

## 2024-02-23 DIAGNOSIS — S72141D Displaced intertrochanteric fracture of right femur, subsequent encounter for closed fracture with routine healing: Secondary | ICD-10-CM | POA: Diagnosis not present

## 2024-02-23 DIAGNOSIS — I2510 Atherosclerotic heart disease of native coronary artery without angina pectoris: Secondary | ICD-10-CM | POA: Diagnosis not present

## 2024-02-23 DIAGNOSIS — I10 Essential (primary) hypertension: Secondary | ICD-10-CM | POA: Diagnosis not present

## 2024-02-25 NOTE — Progress Notes (Signed)
 Patient Care Team: Margaree Mackintosh, MD as PCP - General (Internal Medicine) Tonny Bollman, MD as PCP - Cardiology (Cardiology) Lenord Fellers Luanna Cole, MD (Internal Medicine)  Visit Date: 03/02/24  Subjective:   Chief Complaint  Patient presents with   Medical Management of Chronic Issues  Patient WU:[id number] C Azeez,Female DOB:1941/11/17,82 y.o. YNW:[id number]   83 y.o. Female presents today for 6 months follow-up for HTN; HLD; Impaired Glucose Tolerance. Last seen by this office via video on 10/18/2023, in the interim has seen Dr. Tonny Bollman, Cardiologist, Orthopedics [facility] in 10/2023 onwards, and 3/12 saw Dr Vassie Loll, Pulmonologist.   History of Hypertension; Coronary Artery Disease; Palpitations treated with 50 mg Metoprolol tartrate BID, 25 mg HCTZ daily, ASA, and 75 mg Plavix daily. Followed by Cardiologist, Dr. Tonny Bollman. Blood Pressure: normotensive today at 136/84.   History of Hyperlipidemia treated with 20 mg Rosuvastatin daily. 02/29/2024 Lipid Panel with Triglycerides 162, down from 172 in 08/2023.    History of Impaired Glucose Tolerance 02/29/2024 HgbA1c 6.6, no change from 08/30/23, up from 6.0% in 2023. Says that she has been eating a lot of pasta recently, and the weekend before her labs she had some cupcakes at a birthday -since being in the hospital she has had constant nausea, which is why she has been eating mostly white pasta. In the past has tried Metformin, which she could not tolerate due to cardiovascular side effects and nausea. Postponed eye exam.  History of GERD treated with 40 mg Nexium daily.   History of Insomnia treated with 2 mg Xanax nightly as needed.   History of Musculoskeletal Pain, currently going through home PT following her right hip fracture in 08/2023, which required surgery.    Discussed Flu & Tdap: postponed Past Medical History:  Diagnosis Date   Atrial tachycardia (HCC)    a. noted at cardiac rehab 5/13; event monitor ordered to  assess for AFib   CAD (coronary artery disease)    a. NSTEMI 03/2012 (LHC 03/27/12: pLAD 30%, mLAD 99%, mCFX 95%, mRCA 99% with thrombus, EF 65%);  s/p PTCA/DESx1 to prox-mid RCA 03/27/12 urgently in setting of hypotension/bradycardia, with staged PTCA/Evolve study stent to mid LAD & PTCA/Evolve study stent to prox LCx 03/29/12 ;   echo 03/28/12:EF 60%, Aortic sclerosis without AS, mild RVE, mild reduced RVSF     Endometrial polyp    HTN (hypertension)    Hyperlipidemia    MI, old    Sleep related hypoxia     Allergies  Allergen Reactions   Penicillins Hives, Swelling and Other (See Comments)   Sulfonamide Derivatives Hives and Swelling   Lactose Intolerance (Gi) Diarrhea   Sulfamethoxazole Swelling    Family History  Problem Relation Age of Onset   Coronary artery disease Mother        CABG at age 4, PPM   Colon cancer Father    Cancer - Other Father        larnyx   Social Hx: Married. Husband has memory issues and requires constant care in home.One daughter who is married and is a Environmental education officer. Non-smoker, social alcohol consumption.  Review of Systems  Constitutional:  Negative for fever and malaise/fatigue.  HENT:  Negative for congestion.   Eyes:  Negative for blurred vision.  Respiratory:  Negative for cough and shortness of breath.   Cardiovascular:  Negative for chest pain, palpitations and leg swelling.  Gastrointestinal:  Negative for vomiting.  Musculoskeletal:  Negative for back pain.  Skin:  Negative  for rash.  Neurological:  Negative for loss of consciousness and headaches.     Objective:  Vitals: BP 136/84   Pulse 71   Temp 98.4 F (36.9 C)   Wt 141 lb (64 kg)   SpO2 95%   BMI 27.54 kg/m   Physical Exam Vitals and nursing note reviewed.  Constitutional:      General: She is not in acute distress.    Appearance: Normal appearance. She is not toxic-appearing.  HENT:     Head: Normocephalic and atraumatic.  Cardiovascular:     Rate and Rhythm: Normal  rate and regular rhythm. No extrasystoles are present.    Pulses: Normal pulses.     Heart sounds: Normal heart sounds. No murmur heard.    No friction rub. No gallop.  Pulmonary:     Effort: Pulmonary effort is normal. No respiratory distress.     Breath sounds: Normal breath sounds. No wheezing or rales.  Skin:    General: Skin is warm and dry.  Neurological:     Mental Status: She is alert and oriented to person, place, and time. Mental status is at baseline.  Psychiatric:        Mood and Affect: Mood normal.        Behavior: Behavior normal.        Thought Content: Thought content normal.        Judgment: Judgment normal.     Results:  Studies Obtained And Personally Reviewed By Me:  Diabetic Foot Exam - Simple   Simple Foot Form Diabetic Foot exam was performed with the following findings: Yes 03/02/2024 12:11 PM  Visual Inspection No deformities, no ulcerations, no other skin breakdown bilaterally: Yes Sensation Testing Intact to touch and monofilament testing bilaterally: Yes Pulse Check Posterior Tibialis and Dorsalis pulse intact bilaterally: Yes Comments    05/20/2023 Stress Test LV perfusion is normal. There is no evidence of ischemia. There is no evidence of infarction. Left ventricular function is normal. Nuclear stress EF: 61 %.   Long Term Monitor 5/25 - 05/18/2023  The basic rhythm is normal sinus with an average HR of 77 bpm No atrial fibrillation or flutter No high-grade heart block or pathologic pauses There are rare PVC's and frequent supraventricular beats without sustained arrhythmias. PAC burden 10%, short supraventricular runs, no sustained run.   Labs:     Component Value Date/Time   NA 140 10/16/2023 1331   NA 142 05/13/2022 1142   K 3.4 (L) 10/16/2023 1331   CL 99 10/16/2023 1331   CO2 31 10/16/2023 1331   GLUCOSE 110 (H) 10/16/2023 1331   BUN 8 10/16/2023 1331   BUN 11 05/13/2022 1142   CREATININE 0.58 10/16/2023 1331   CREATININE 0.78  08/30/2023 0926   CALCIUM 9.5 10/16/2023 1331   PROT 6.9 02/29/2024 1017   PROT 6.7 12/29/2017 1427   ALBUMIN 4.1 10/16/2023 1331   ALBUMIN 4.2 12/29/2017 1427   AST 16 02/29/2024 1017   ALT 11 02/29/2024 1017   ALKPHOS 105 10/16/2023 1331   BILITOT 0.5 02/29/2024 1017   BILITOT 0.3 12/29/2017 1427   GFRNONAA >60 10/16/2023 1331   GFRNONAA 68 08/16/2020 1156   GFRAA 79 08/16/2020 1156    Lab Results  Component Value Date   WBC 4.2 10/16/2023   HGB 12.6 10/16/2023   HCT 39.3 10/16/2023   MCV 91.0 10/16/2023   PLT 179 10/16/2023   Lab Results  Component Value Date   CHOL 155 02/29/2024  HDL 63 02/29/2024   LDLCALC 68 02/29/2024   LDLDIRECT 152.4 03/09/2012   TRIG 162 (H) 02/29/2024   CHOLHDL 2.5 02/29/2024   Lab Results  Component Value Date   HGBA1C 6.6 (H) 02/29/2024    Lab Results  Component Value Date   TSH 2.85 08/30/2023   Assessment & Plan:  Hypertension; Coronary Artery Disease; Palpitations treated with 50 mg Metoprolol tartrate BID, 25 mg HCTZ daily, ASA, and 75 mg Plavix daily. Followed by Cardiologist, Dr. Tonny Bollman. Blood Pressure: normotensive today at 136/84.   Hyperlipidemia treated with 20 mg Rosuvastatin daily. 02/29/2024 Lipid Panel with Triglycerides 162, down from 172 in 08/2023.    Impaired Glucose Tolerance 02/29/2024 HgbA1c 6.6, no change from 08/30/23, up from 6.0% in 2023. In the past has tried Metformin, which she could not tolerate due to cardiovascular side effects and nausea. Postponed eye exam.  Nausea: constant since 08/2023. Sending in Zofran - take 1 tablet (4 mg total) by mouth every 8 (eight) hours as needed for nausea or vomiting   GERD treated with 40 mg Nexium daily.   Insomnia treated with 2 mg Xanax nightly as needed.   Musculoskeletal Pain, currently going through home PT following her right hip fracture in 08/2023, which required surgery.    Vaccine Counseling: discussed Flu & Tdap - postponed    I,Emily Lagle,acting  as a scribe for Margaree Mackintosh, MD.,have documented all relevant documentation on the behalf of Margaree Mackintosh, MD,as directed by  Margaree Mackintosh, MD while in the presence of Margaree Mackintosh, MD.   I, Margaree Mackintosh, MD, have reviewed all documentation for this visit. The documentation on 03/12/24 for the exam, diagnosis, procedures, and orders are all accurate and complete.

## 2024-02-28 DIAGNOSIS — E119 Type 2 diabetes mellitus without complications: Secondary | ICD-10-CM | POA: Diagnosis not present

## 2024-02-28 DIAGNOSIS — I2510 Atherosclerotic heart disease of native coronary artery without angina pectoris: Secondary | ICD-10-CM | POA: Diagnosis not present

## 2024-02-28 DIAGNOSIS — S72141D Displaced intertrochanteric fracture of right femur, subsequent encounter for closed fracture with routine healing: Secondary | ICD-10-CM | POA: Diagnosis not present

## 2024-02-28 DIAGNOSIS — I10 Essential (primary) hypertension: Secondary | ICD-10-CM | POA: Diagnosis not present

## 2024-02-28 DIAGNOSIS — J9611 Chronic respiratory failure with hypoxia: Secondary | ICD-10-CM | POA: Diagnosis not present

## 2024-02-28 DIAGNOSIS — I252 Old myocardial infarction: Secondary | ICD-10-CM | POA: Diagnosis not present

## 2024-02-29 ENCOUNTER — Other Ambulatory Visit: Payer: Medicare Other

## 2024-02-29 ENCOUNTER — Encounter: Payer: Self-pay | Admitting: Pulmonary Disease

## 2024-02-29 DIAGNOSIS — E781 Pure hyperglyceridemia: Secondary | ICD-10-CM | POA: Diagnosis not present

## 2024-02-29 DIAGNOSIS — E7849 Other hyperlipidemia: Secondary | ICD-10-CM | POA: Diagnosis not present

## 2024-02-29 DIAGNOSIS — R7302 Impaired glucose tolerance (oral): Secondary | ICD-10-CM

## 2024-02-29 DIAGNOSIS — I10 Essential (primary) hypertension: Secondary | ICD-10-CM

## 2024-03-01 ENCOUNTER — Encounter (HOSPITAL_BASED_OUTPATIENT_CLINIC_OR_DEPARTMENT_OTHER): Payer: Self-pay | Admitting: Pulmonary Disease

## 2024-03-01 ENCOUNTER — Ambulatory Visit (HOSPITAL_BASED_OUTPATIENT_CLINIC_OR_DEPARTMENT_OTHER): Payer: Medicare Other | Admitting: Pulmonary Disease

## 2024-03-01 VITALS — BP 132/68 | HR 68 | Resp 14 | Ht 60.0 in | Wt 140.0 lb

## 2024-03-01 DIAGNOSIS — G4734 Idiopathic sleep related nonobstructive alveolar hypoventilation: Secondary | ICD-10-CM | POA: Diagnosis not present

## 2024-03-01 DIAGNOSIS — Z9981 Dependence on supplemental oxygen: Secondary | ICD-10-CM

## 2024-03-01 DIAGNOSIS — Z87891 Personal history of nicotine dependence: Secondary | ICD-10-CM | POA: Diagnosis not present

## 2024-03-01 LAB — LIPID PANEL
Cholesterol: 155 mg/dL (ref <200)
HDL: 63 mg/dL (ref >=50)
LDL Cholesterol (Calc): 68 mg/dL
Non-HDL Cholesterol (Calc): 92 mg/dL (ref <130)
Total CHOL/HDL Ratio: 2.5 (calc) (ref <5.0)
Triglycerides: 162 mg/dL — ABNORMAL HIGH (ref <150)

## 2024-03-01 LAB — HEPATIC FUNCTION PANEL
AG Ratio: 1.9 (calc) (ref 1.0–2.5)
ALT: 11 U/L (ref 6–29)
AST: 16 U/L (ref 10–35)
Albumin: 4.5 g/dL (ref 3.6–5.1)
Alkaline phosphatase (APISO): 88 U/L (ref 37–153)
Bilirubin, Direct: 0.1 mg/dL (ref 0.0–0.2)
Globulin: 2.4 g/dL (ref 1.9–3.7)
Indirect Bilirubin: 0.4 mg/dL (ref 0.2–1.2)
Total Bilirubin: 0.5 mg/dL (ref 0.2–1.2)
Total Protein: 6.9 g/dL (ref 6.1–8.1)

## 2024-03-01 LAB — HEMOGLOBIN A1C
Hgb A1c MFr Bld: 6.6 %{Hb} — ABNORMAL HIGH (ref <5.7)
Mean Plasma Glucose: 143 mg/dL
eAG (mmol/L): 7.9 mmol/L

## 2024-03-01 NOTE — Progress Notes (Signed)
   Subjective:    Patient ID: KIANA HOLLAR, female    DOB: 05/15/1941, 83 y.o.   MRN: [id number]  HPI  83  yo  remote smoker, retired Engineer, civil (consulting) for FU of hypoxia during sleep -first seen 05/2015  She smoked about 10 pack years before she quit in 1982   Annual FU  The patient, with a history of nocturnal hypoxia, presents after a recent fall at home, resulting in a femur fracturesimilar to a previous injury on the opposite side. The patient was making progress with physical therapy, walking with a cane, but experienced a setback after twisting his knee, which had previously undergone a meniscectomy. The patient also discusses his husband's progression from mild to moderate Alzheimer's disease, noting his increasing confusion and repetitive behavior.  The patient's nocturnal hypoxia has been managed with oxygen therapy. However, recent tests show an improvement in his oxygen levels, with only eight minutes of desaturation less than eighty-eight through the night. The patient also mentions a period of confusion and memory issues following surgery in October, which he attributes to a lack of oxygen and withdrawal from two of his medications, alprazolam and desvenflaxamine.  Significant tests/ events reviewed 11/2022 ONO/ RA showed desaturation for only 27 minutes  10/2021 ONO/RA  sat <88% for 1h 45 mins during sleep 11/2020 ONO desaturation 52 minutes less than 88% in 3 hours less than 89% ONO  09/2019 >>desatn x 6h   PSG 04/2015 (dohmeier)- did not show significant OSA -AHI 4.5, RDI 7.6/hwhich showed desaturation for 106 minutes.     PFT 06/2015 Nl lung function. No airflow obstruction/restriction. DLCO nml .  CT angio chest 06/2015 neg for PE, mild emphysema .  Bubble echo with gg 1 DD , EF ok , no shunt.   Review of Systems neg for any significant sore throat, dysphagia, itching, sneezing, nasal congestion or excess/ purulent secretions, fever, chills, sweats, unintended wt loss, pleuritic  or exertional cp, hempoptysis, orthopnea pnd or change in chronic leg swelling. Also denies presyncope, palpitations, heartburn, abdominal pain, nausea, vomiting, diarrhea or change in bowel or urinary habits, dysuria,hematuria, rash, arthralgias, visual complaints, headache, numbness weakness or ataxia.     Objective:   Physical Exam  Gen. Pleasant, well-nourished, in no distress ENT - no thrush, no pallor/icterus,no post nasal drip Neck: No JVD, no thyromegaly, no carotid bruits Lungs: no use of accessory muscles, no dullness to percussion, clear without rales or rhonchi  Cardiovascular: Rhythm regular, heart sounds  normal, no murmurs or gallops, no peripheral edema Musculoskeletal: No deformities, no cyanosis or clubbing         Assessment & Plan:   Nocturnal hypoxia He experiences nocturnal hypoxia with previous tests indicating desaturation. Recent testing shows improvement with only eight minutes of desaturation below 88% during the night, compared to 27 minutes the previous year. The etiology remains unclear, and he has been on supplemental oxygen for over five years. Daytime oxygen saturation levels have been reported as low as 85-88%, suggesting the need to maintain oxygen therapy for now. The decision to discontinue oxygen should be based on his subjective experience rather than solely on numerical data. - Advise  to experiment with discontinuing oxygen and monitor symptoms. - Instruct  to maintain oxygen if daytime saturation levels remain low (85-88%). - Advise  to report if he decides to discontinue oxygen use. - Emphasize that the decision to discontinue oxygen should be based on his subjective experience rather than numerical data.

## 2024-03-01 NOTE — Patient Instructions (Signed)
 Continue using oxygen as needed Monitor levels & let me know if consistently low

## 2024-03-02 ENCOUNTER — Ambulatory Visit (INDEPENDENT_AMBULATORY_CARE_PROVIDER_SITE_OTHER): Payer: Medicare Other | Admitting: Internal Medicine

## 2024-03-02 ENCOUNTER — Encounter: Payer: Self-pay | Admitting: Internal Medicine

## 2024-03-02 VITALS — BP 136/84 | HR 71 | Temp 98.4°F | Wt 141.0 lb

## 2024-03-02 DIAGNOSIS — E7849 Other hyperlipidemia: Secondary | ICD-10-CM | POA: Diagnosis not present

## 2024-03-02 DIAGNOSIS — Z7902 Long term (current) use of antithrombotics/antiplatelets: Secondary | ICD-10-CM | POA: Diagnosis not present

## 2024-03-02 DIAGNOSIS — E781 Pure hyperglyceridemia: Secondary | ICD-10-CM

## 2024-03-02 DIAGNOSIS — G4709 Other insomnia: Secondary | ICD-10-CM | POA: Diagnosis not present

## 2024-03-02 DIAGNOSIS — G4733 Obstructive sleep apnea (adult) (pediatric): Secondary | ICD-10-CM | POA: Diagnosis not present

## 2024-03-02 DIAGNOSIS — Z87891 Personal history of nicotine dependence: Secondary | ICD-10-CM | POA: Diagnosis not present

## 2024-03-02 DIAGNOSIS — I2510 Atherosclerotic heart disease of native coronary artery without angina pectoris: Secondary | ICD-10-CM | POA: Diagnosis not present

## 2024-03-02 DIAGNOSIS — F419 Anxiety disorder, unspecified: Secondary | ICD-10-CM | POA: Diagnosis not present

## 2024-03-02 DIAGNOSIS — J9611 Chronic respiratory failure with hypoxia: Secondary | ICD-10-CM | POA: Diagnosis not present

## 2024-03-02 DIAGNOSIS — Z9181 History of falling: Secondary | ICD-10-CM | POA: Diagnosis not present

## 2024-03-02 DIAGNOSIS — S72141D Displaced intertrochanteric fracture of right femur, subsequent encounter for closed fracture with routine healing: Secondary | ICD-10-CM | POA: Diagnosis not present

## 2024-03-02 DIAGNOSIS — E119 Type 2 diabetes mellitus without complications: Secondary | ICD-10-CM

## 2024-03-02 DIAGNOSIS — F32A Depression, unspecified: Secondary | ICD-10-CM

## 2024-03-02 DIAGNOSIS — Z8781 Personal history of (healed) traumatic fracture: Secondary | ICD-10-CM | POA: Diagnosis not present

## 2024-03-02 DIAGNOSIS — K219 Gastro-esophageal reflux disease without esophagitis: Secondary | ICD-10-CM | POA: Diagnosis not present

## 2024-03-02 DIAGNOSIS — Z7901 Long term (current) use of anticoagulants: Secondary | ICD-10-CM | POA: Diagnosis not present

## 2024-03-02 DIAGNOSIS — E785 Hyperlipidemia, unspecified: Secondary | ICD-10-CM | POA: Diagnosis not present

## 2024-03-02 DIAGNOSIS — Z955 Presence of coronary angioplasty implant and graft: Secondary | ICD-10-CM | POA: Diagnosis not present

## 2024-03-02 DIAGNOSIS — I252 Old myocardial infarction: Secondary | ICD-10-CM | POA: Diagnosis not present

## 2024-03-02 DIAGNOSIS — G3184 Mild cognitive impairment, so stated: Secondary | ICD-10-CM | POA: Diagnosis not present

## 2024-03-02 DIAGNOSIS — F439 Reaction to severe stress, unspecified: Secondary | ICD-10-CM

## 2024-03-02 DIAGNOSIS — Z79891 Long term (current) use of opiate analgesic: Secondary | ICD-10-CM | POA: Diagnosis not present

## 2024-03-02 DIAGNOSIS — M7918 Myalgia, other site: Secondary | ICD-10-CM

## 2024-03-02 DIAGNOSIS — I10 Essential (primary) hypertension: Secondary | ICD-10-CM

## 2024-03-02 DIAGNOSIS — Z8679 Personal history of other diseases of the circulatory system: Secondary | ICD-10-CM

## 2024-03-02 DIAGNOSIS — I4719 Other supraventricular tachycardia: Secondary | ICD-10-CM | POA: Diagnosis not present

## 2024-03-02 MED ORDER — ONDANSETRON HCL 4 MG PO TABS: 4.0000 mg | ORAL_TABLET | Freq: Three times a day (TID) | ORAL | 0 refills | Status: DC | PRN

## 2024-03-06 DIAGNOSIS — S72141D Displaced intertrochanteric fracture of right femur, subsequent encounter for closed fracture with routine healing: Secondary | ICD-10-CM | POA: Diagnosis not present

## 2024-03-06 DIAGNOSIS — J9611 Chronic respiratory failure with hypoxia: Secondary | ICD-10-CM | POA: Diagnosis not present

## 2024-03-06 DIAGNOSIS — I252 Old myocardial infarction: Secondary | ICD-10-CM | POA: Diagnosis not present

## 2024-03-06 DIAGNOSIS — I10 Essential (primary) hypertension: Secondary | ICD-10-CM | POA: Diagnosis not present

## 2024-03-06 DIAGNOSIS — E119 Type 2 diabetes mellitus without complications: Secondary | ICD-10-CM | POA: Diagnosis not present

## 2024-03-06 DIAGNOSIS — I2510 Atherosclerotic heart disease of native coronary artery without angina pectoris: Secondary | ICD-10-CM | POA: Diagnosis not present

## 2024-03-12 NOTE — Patient Instructions (Signed)
 It was good to see you today. Watch diet a bit. Continue current medications. Follow up in 6 months for annual exam and medicare wellness visit.

## 2024-03-13 DIAGNOSIS — I252 Old myocardial infarction: Secondary | ICD-10-CM | POA: Diagnosis not present

## 2024-03-13 DIAGNOSIS — E119 Type 2 diabetes mellitus without complications: Secondary | ICD-10-CM | POA: Diagnosis not present

## 2024-03-13 DIAGNOSIS — J9611 Chronic respiratory failure with hypoxia: Secondary | ICD-10-CM | POA: Diagnosis not present

## 2024-03-13 DIAGNOSIS — I10 Essential (primary) hypertension: Secondary | ICD-10-CM | POA: Diagnosis not present

## 2024-03-13 DIAGNOSIS — S72141D Displaced intertrochanteric fracture of right femur, subsequent encounter for closed fracture with routine healing: Secondary | ICD-10-CM | POA: Diagnosis not present

## 2024-03-13 DIAGNOSIS — I2510 Atherosclerotic heart disease of native coronary artery without angina pectoris: Secondary | ICD-10-CM | POA: Diagnosis not present

## 2024-03-17 DIAGNOSIS — S72141D Displaced intertrochanteric fracture of right femur, subsequent encounter for closed fracture with routine healing: Secondary | ICD-10-CM | POA: Diagnosis not present

## 2024-03-20 DIAGNOSIS — E119 Type 2 diabetes mellitus without complications: Secondary | ICD-10-CM | POA: Diagnosis not present

## 2024-03-20 DIAGNOSIS — I10 Essential (primary) hypertension: Secondary | ICD-10-CM | POA: Diagnosis not present

## 2024-03-20 DIAGNOSIS — J9611 Chronic respiratory failure with hypoxia: Secondary | ICD-10-CM | POA: Diagnosis not present

## 2024-03-20 DIAGNOSIS — S72141D Displaced intertrochanteric fracture of right femur, subsequent encounter for closed fracture with routine healing: Secondary | ICD-10-CM | POA: Diagnosis not present

## 2024-03-20 DIAGNOSIS — I252 Old myocardial infarction: Secondary | ICD-10-CM | POA: Diagnosis not present

## 2024-03-20 DIAGNOSIS — I2510 Atherosclerotic heart disease of native coronary artery without angina pectoris: Secondary | ICD-10-CM | POA: Diagnosis not present

## 2024-03-27 DIAGNOSIS — I10 Essential (primary) hypertension: Secondary | ICD-10-CM | POA: Diagnosis not present

## 2024-03-27 DIAGNOSIS — I2510 Atherosclerotic heart disease of native coronary artery without angina pectoris: Secondary | ICD-10-CM | POA: Diagnosis not present

## 2024-03-27 DIAGNOSIS — I252 Old myocardial infarction: Secondary | ICD-10-CM | POA: Diagnosis not present

## 2024-03-27 DIAGNOSIS — J9611 Chronic respiratory failure with hypoxia: Secondary | ICD-10-CM | POA: Diagnosis not present

## 2024-03-27 DIAGNOSIS — E119 Type 2 diabetes mellitus without complications: Secondary | ICD-10-CM | POA: Diagnosis not present

## 2024-03-27 DIAGNOSIS — S72141D Displaced intertrochanteric fracture of right femur, subsequent encounter for closed fracture with routine healing: Secondary | ICD-10-CM | POA: Diagnosis not present

## 2024-04-01 DIAGNOSIS — Z955 Presence of coronary angioplasty implant and graft: Secondary | ICD-10-CM | POA: Diagnosis not present

## 2024-04-01 DIAGNOSIS — Z87891 Personal history of nicotine dependence: Secondary | ICD-10-CM | POA: Diagnosis not present

## 2024-04-01 DIAGNOSIS — E119 Type 2 diabetes mellitus without complications: Secondary | ICD-10-CM | POA: Diagnosis not present

## 2024-04-01 DIAGNOSIS — E785 Hyperlipidemia, unspecified: Secondary | ICD-10-CM | POA: Diagnosis not present

## 2024-04-01 DIAGNOSIS — I252 Old myocardial infarction: Secondary | ICD-10-CM | POA: Diagnosis not present

## 2024-04-01 DIAGNOSIS — S72141D Displaced intertrochanteric fracture of right femur, subsequent encounter for closed fracture with routine healing: Secondary | ICD-10-CM | POA: Diagnosis not present

## 2024-04-01 DIAGNOSIS — J9611 Chronic respiratory failure with hypoxia: Secondary | ICD-10-CM | POA: Diagnosis not present

## 2024-04-01 DIAGNOSIS — Z8781 Personal history of (healed) traumatic fracture: Secondary | ICD-10-CM | POA: Diagnosis not present

## 2024-04-01 DIAGNOSIS — F32A Depression, unspecified: Secondary | ICD-10-CM | POA: Diagnosis not present

## 2024-04-01 DIAGNOSIS — G3184 Mild cognitive impairment, so stated: Secondary | ICD-10-CM | POA: Diagnosis not present

## 2024-04-01 DIAGNOSIS — I2510 Atherosclerotic heart disease of native coronary artery without angina pectoris: Secondary | ICD-10-CM | POA: Diagnosis not present

## 2024-04-01 DIAGNOSIS — Z9181 History of falling: Secondary | ICD-10-CM | POA: Diagnosis not present

## 2024-04-01 DIAGNOSIS — F419 Anxiety disorder, unspecified: Secondary | ICD-10-CM | POA: Diagnosis not present

## 2024-04-01 DIAGNOSIS — Z7902 Long term (current) use of antithrombotics/antiplatelets: Secondary | ICD-10-CM | POA: Diagnosis not present

## 2024-04-01 DIAGNOSIS — I4719 Other supraventricular tachycardia: Secondary | ICD-10-CM | POA: Diagnosis not present

## 2024-04-01 DIAGNOSIS — I10 Essential (primary) hypertension: Secondary | ICD-10-CM | POA: Diagnosis not present

## 2024-04-03 DIAGNOSIS — I2510 Atherosclerotic heart disease of native coronary artery without angina pectoris: Secondary | ICD-10-CM | POA: Diagnosis not present

## 2024-04-03 DIAGNOSIS — I10 Essential (primary) hypertension: Secondary | ICD-10-CM | POA: Diagnosis not present

## 2024-04-03 DIAGNOSIS — E119 Type 2 diabetes mellitus without complications: Secondary | ICD-10-CM | POA: Diagnosis not present

## 2024-04-03 DIAGNOSIS — I252 Old myocardial infarction: Secondary | ICD-10-CM | POA: Diagnosis not present

## 2024-04-03 DIAGNOSIS — S72141D Displaced intertrochanteric fracture of right femur, subsequent encounter for closed fracture with routine healing: Secondary | ICD-10-CM | POA: Diagnosis not present

## 2024-04-03 DIAGNOSIS — J9611 Chronic respiratory failure with hypoxia: Secondary | ICD-10-CM | POA: Diagnosis not present

## 2024-04-12 DIAGNOSIS — I252 Old myocardial infarction: Secondary | ICD-10-CM | POA: Diagnosis not present

## 2024-04-12 DIAGNOSIS — S72141D Displaced intertrochanteric fracture of right femur, subsequent encounter for closed fracture with routine healing: Secondary | ICD-10-CM | POA: Diagnosis not present

## 2024-04-12 DIAGNOSIS — E119 Type 2 diabetes mellitus without complications: Secondary | ICD-10-CM | POA: Diagnosis not present

## 2024-04-12 DIAGNOSIS — I2510 Atherosclerotic heart disease of native coronary artery without angina pectoris: Secondary | ICD-10-CM | POA: Diagnosis not present

## 2024-04-12 DIAGNOSIS — I10 Essential (primary) hypertension: Secondary | ICD-10-CM | POA: Diagnosis not present

## 2024-04-12 DIAGNOSIS — J9611 Chronic respiratory failure with hypoxia: Secondary | ICD-10-CM | POA: Diagnosis not present

## 2024-04-19 DIAGNOSIS — E119 Type 2 diabetes mellitus without complications: Secondary | ICD-10-CM | POA: Diagnosis not present

## 2024-04-19 DIAGNOSIS — I10 Essential (primary) hypertension: Secondary | ICD-10-CM | POA: Diagnosis not present

## 2024-04-19 DIAGNOSIS — I2510 Atherosclerotic heart disease of native coronary artery without angina pectoris: Secondary | ICD-10-CM | POA: Diagnosis not present

## 2024-04-19 DIAGNOSIS — J9611 Chronic respiratory failure with hypoxia: Secondary | ICD-10-CM | POA: Diagnosis not present

## 2024-04-19 DIAGNOSIS — S72141D Displaced intertrochanteric fracture of right femur, subsequent encounter for closed fracture with routine healing: Secondary | ICD-10-CM | POA: Diagnosis not present

## 2024-04-19 DIAGNOSIS — I252 Old myocardial infarction: Secondary | ICD-10-CM | POA: Diagnosis not present

## 2024-04-24 DIAGNOSIS — I2510 Atherosclerotic heart disease of native coronary artery without angina pectoris: Secondary | ICD-10-CM | POA: Diagnosis not present

## 2024-04-24 DIAGNOSIS — E119 Type 2 diabetes mellitus without complications: Secondary | ICD-10-CM | POA: Diagnosis not present

## 2024-04-24 DIAGNOSIS — I252 Old myocardial infarction: Secondary | ICD-10-CM | POA: Diagnosis not present

## 2024-04-24 DIAGNOSIS — J9611 Chronic respiratory failure with hypoxia: Secondary | ICD-10-CM | POA: Diagnosis not present

## 2024-04-24 DIAGNOSIS — S72141D Displaced intertrochanteric fracture of right femur, subsequent encounter for closed fracture with routine healing: Secondary | ICD-10-CM | POA: Diagnosis not present

## 2024-04-24 DIAGNOSIS — I10 Essential (primary) hypertension: Secondary | ICD-10-CM | POA: Diagnosis not present

## 2024-05-31 ENCOUNTER — Encounter: Payer: Self-pay | Admitting: Podiatry

## 2024-05-31 ENCOUNTER — Ambulatory Visit (INDEPENDENT_AMBULATORY_CARE_PROVIDER_SITE_OTHER): Admitting: Podiatry

## 2024-05-31 DIAGNOSIS — M79674 Pain in right toe(s): Secondary | ICD-10-CM

## 2024-05-31 DIAGNOSIS — M79675 Pain in left toe(s): Secondary | ICD-10-CM

## 2024-05-31 DIAGNOSIS — B351 Tinea unguium: Secondary | ICD-10-CM

## 2024-05-31 NOTE — Progress Notes (Signed)
 This patient returns to the office for evaluation and treatment of long thick painful nails .  This patient is unable to trim her own nails since the patient cannot reach her feet.  She has history of hip surgery  and hip fracture which prevents her from touching her feet.  Patient says the nails are painful walking and wearing his shoes.  She returns for preventive foot care services.  General Appearance  Alert, conversant and in no acute stress.  Vascular  Dorsalis pedis and posterior tibial  pulses are absent  bilaterally.  Capillary return is within normal limits  bilaterally. Temperature is within normal limits  bilaterally.  Neurologic  Senn-Weinstein monofilament wire test within normal limits  bilaterally. Muscle power within normal limits bilaterally.  Nails Thick disfigured discolored nails with subungual debris  from hallux to fifth toes bilaterally. No evidence of bacterial infection or drainage bilaterally.  Orthopedic  No limitations of motion  feet .  No crepitus or effusions noted.  HAV  B/L.  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  No signs of infections or ulcers noted.     Onychomycosis  Pain in toes right foot  Pain in toes left foot  Debridement  of nails  1-5  B/L with a nail nipper.  Nails were then filed using a dremel tool with no incidents.    RTC 3   months    Ruffin Cotton DPM

## 2024-06-09 ENCOUNTER — Other Ambulatory Visit: Payer: Self-pay | Admitting: Cardiovascular Disease

## 2024-06-12 ENCOUNTER — Other Ambulatory Visit: Payer: Self-pay

## 2024-06-12 MED ORDER — HYDROCHLOROTHIAZIDE 25 MG PO TABS: 25.0000 mg | ORAL_TABLET | Freq: Every day | ORAL | 1 refills | Status: DC

## 2024-07-01 ENCOUNTER — Other Ambulatory Visit: Payer: Self-pay | Admitting: Internal Medicine

## 2024-07-09 ENCOUNTER — Emergency Department (HOSPITAL_BASED_OUTPATIENT_CLINIC_OR_DEPARTMENT_OTHER)

## 2024-07-09 ENCOUNTER — Emergency Department (HOSPITAL_BASED_OUTPATIENT_CLINIC_OR_DEPARTMENT_OTHER)
Admission: EM | Admit: 2024-07-09 | Discharge: 2024-07-09 | Disposition: A | Discharge status: Home / Self Care | Attending: Emergency Medicine | Admitting: Emergency Medicine

## 2024-07-09 ENCOUNTER — Other Ambulatory Visit: Payer: Self-pay

## 2024-07-09 ENCOUNTER — Other Ambulatory Visit: Payer: Self-pay | Admitting: Family

## 2024-07-09 ENCOUNTER — Encounter (HOSPITAL_BASED_OUTPATIENT_CLINIC_OR_DEPARTMENT_OTHER): Payer: Self-pay

## 2024-07-09 DIAGNOSIS — M47812 Spondylosis without myelopathy or radiculopathy, cervical region: Secondary | ICD-10-CM | POA: Diagnosis not present

## 2024-07-09 DIAGNOSIS — S0990XA Unspecified injury of head, initial encounter: Secondary | ICD-10-CM | POA: Diagnosis not present

## 2024-07-09 DIAGNOSIS — Z7902 Long term (current) use of antithrombotics/antiplatelets: Secondary | ICD-10-CM | POA: Insufficient documentation

## 2024-07-09 DIAGNOSIS — S0083XA Contusion of other part of head, initial encounter: Secondary | ICD-10-CM | POA: Insufficient documentation

## 2024-07-09 DIAGNOSIS — R300 Dysuria: Secondary | ICD-10-CM | POA: Diagnosis not present

## 2024-07-09 DIAGNOSIS — S199XXA Unspecified injury of neck, initial encounter: Secondary | ICD-10-CM | POA: Diagnosis not present

## 2024-07-09 DIAGNOSIS — N3001 Acute cystitis with hematuria: Secondary | ICD-10-CM | POA: Diagnosis not present

## 2024-07-09 DIAGNOSIS — Z7901 Long term (current) use of anticoagulants: Secondary | ICD-10-CM | POA: Diagnosis not present

## 2024-07-09 DIAGNOSIS — W06XXXA Fall from bed, initial encounter: Secondary | ICD-10-CM | POA: Insufficient documentation

## 2024-07-09 DIAGNOSIS — I672 Cerebral atherosclerosis: Secondary | ICD-10-CM | POA: Diagnosis not present

## 2024-07-09 NOTE — ED Triage Notes (Signed)
 Pt from UC requesting CT head. Denies HA, blurred vision, nausea. States she slid off the bed, not sure what I hit my head on- either the floor or the edge of the bed.   Compliant w plavix , last dose last night.

## 2024-07-09 NOTE — ED Notes (Signed)
 Patient stated she didn't want to wait for paperwork. Assisted to vehicle after changing clothes.

## 2024-07-09 NOTE — ED Provider Notes (Signed)
 [facility] AT [facility] Provider Note   CSN: [id number] Arrival date & time: 07/09/24  1805     Patient presents with: Carrie Chung is a 83 y.o. female.   HPI   83 year old female presents emergency department with concern for head injury.  Patient states she was sitting on the end of the bed about to use the commode when she started to slide down and believes she hit the right side of her head on the bed frame.  No loss of consciousness.  She does take Plavix  nightly, her last dose was last night.  She presents today with bruising of the right forehead with concern for head injury.  Denies any other traumatic injury.  Currently on Keflex  for UTI.  Prior to Admission medications   Medication Sig Start Date End Date Taking? Authorizing Provider  acetaminophen  (TYLENOL ) 325 MG tablet Take 2 tablets (650 mg total) by mouth 4 (four) times daily -  with meals and at bedtime. 10/07/23   Love, Sharlet RAMAN, PA-C  alprazolam  (XANAX ) 2 MG tablet TAKE 1 TABLET(2 MG) BY MOUTH AT BEDTIME AS NEEDED FOR SLEEP 07/03/24   Perri Ronal PARAS, MD  Calcium  Citrate-Vitamin D  (CITRUS CALCIUM /VITAMIN D ) 200-6.25 MG-MCG TABS Take 200 mg of elemental calcium  by mouth daily after supper. 10/08/23   Love, Sharlet RAMAN, PA-C  clopidogrel  (PLAVIX ) 75 MG tablet TAKE 1 TABLET(75 MG) BY MOUTH DAILY 11/08/23   Wonda Sharper, MD  desvenlafaxine  (PRISTIQ ) 50 MG 24 hr tablet TAKE 1 TABLET(50 MG) BY MOUTH DAILY 06/25/23   Webb, Padonda B, FNP  esomeprazole  (NEXIUM ) 40 MG packet Take 40 mg by mouth daily before breakfast.    [provider]  hydrochlorothiazide  (HYDRODIURIL ) 25 MG tablet Take 1 tablet (25 mg total) by mouth daily. 06/12/24   Wonda Sharper, MD  metoprolol  tartrate (LOPRESSOR ) 50 MG tablet Take 1 tablet (50 mg total) by mouth 2 (two) times daily. 11/08/23   Wonda Sharper, MD  ondansetron  (ZOFRAN ) 4 MG tablet Take 1 tablet (4 mg total) by mouth every 8 (eight) hours  as needed for nausea or vomiting. 03/02/24   Baxley, Ronal PARAS, MD  oxyCODONE  (OXY IR/ROXICODONE ) 5 MG immediate release tablet Take 1 tablet (5 mg total) by mouth every 6 (six) hours as needed for severe pain (pain score 7-10). 10/07/23   Love, Sharlet RAMAN, PA-C  rosuvastatin  (CRESTOR ) 20 MG tablet TAKE 1 TABLET(20 MG) BY MOUTH DAILY 11/08/23   Wonda Sharper, MD    Allergies: Penicillins, Sulfonamide derivatives, Lactose intolerance (gi), and Sulfamethoxazole    Review of Systems  Constitutional:  Negative for fever.  Respiratory:  Negative for shortness of breath.   Cardiovascular:  Negative for chest pain.  Gastrointestinal:  Negative for abdominal pain, diarrhea and vomiting.  Skin:  Negative for rash.  Neurological:  Negative for headaches.    Updated Vital Signs BP (!) 148/80 (BP Location: Right Arm)   Pulse 80   Temp 98.3 F (36.8 C)   Resp 18   SpO2 93%   Physical Exam Vitals and nursing note reviewed.  Constitutional:      Appearance: Normal appearance.  HENT:     Head: Normocephalic.     Comments: Mild bruising to the right frontal forehead    Mouth/Throat:     Mouth: Mucous membranes are moist.  Cardiovascular:     Rate and Rhythm: Normal rate.  Pulmonary:     Effort: Pulmonary effort is normal. No respiratory distress.  Abdominal:     Palpations: Abdomen is soft.     Tenderness: There is no abdominal tenderness.  Musculoskeletal:     Cervical back: No rigidity or tenderness.  Skin:    General: Skin is warm.  Neurological:     Mental Status: She is alert and oriented to person, place, and time. Mental status is at baseline.  Psychiatric:        Mood and Affect: Mood normal.     (all labs ordered are listed, but only abnormal results are displayed) Labs Reviewed - No data to display  EKG: None  Radiology: CT Head Wo Contrast Result Date: 07/09/2024 CLINICAL DATA:  Head trauma, minor (Age >= 65y) fall on thinners Fall from bed. EXAM: CT HEAD WITHOUT  CONTRAST TECHNIQUE: Contiguous axial images were obtained from the base of the skull through the vertex without intravenous contrast. RADIATION DOSE REDUCTION: This exam was performed according to the departmental dose-optimization program which includes automated exposure control, adjustment of the mA and/or kV according to patient size and/or use of iterative reconstruction technique. COMPARISON:  10/16/2023 FINDINGS: Brain: No intracranial hemorrhage, mass effect, or midline shift. Stable degree of atrophy and chronic small vessel ischemia. No hydrocephalus. The basilar cisterns are patent. No evidence of territorial infarct or acute ischemia. No extra-axial or intracranial fluid collection. Vascular: Atherosclerosis of skullbase vasculature without hyperdense vessel or abnormal calcification. Skull: No fracture or focal lesion. Sinuses/Orbits: No fracture or focal lesion. Other: None. IMPRESSION: 1. No acute intracranial abnormality. No skull fracture. 2. Stable atrophy and chronic small vessel ischemia. Electronically Signed   By: Andrea Gasman M.D.   On: 07/09/2024 19:22   CT Cervical Spine Wo Contrast Result Date: 07/09/2024 EXAM: CT CERVICAL SPINE WITHOUT CONTRAST 07/09/2024 06:30:15 PM TECHNIQUE: CT of the cervical spine was performed without the administration of intravenous contrast. Multiplanar reformatted images are provided for review. Automated exposure control, iterative reconstruction, and/or weight based adjustment of the mA/kV was utilized to reduce the radiation dose to as low as reasonably achievable. COMPARISON: None available. CLINICAL HISTORY: Polytrauma, blunt; fall on thinners. FINDINGS: CERVICAL SPINE: BONES AND ALIGNMENT: No acute fracture or traumatic malalignment. DEGENERATIVE CHANGES: Mild degenerative changes in the mid to lower cervical spine. SOFT TISSUES: No prevertebral soft tissue swelling. IMPRESSION: 1. No traumatic injury to the cervical spine. Electronically signed by:  Pinkie Pebbles MD 07/09/2024 07:15 PM EDT RP Workstation: [id number]     Procedures   Medications Ordered in the ED - No data to display                                  Medical Decision Making Amount and/or Complexity of Data Reviewed Radiology: ordered.   83 year old female presents emergency department with head injury, on Plavix .  No LOC.  Overall well-appearing.  Normal vital signs.  Neurologically at baseline.  Physical exam shows mild bruising of the right forehead, no hematoma.  Otherwise very reassuring physical exam.  CT of the head and neck shows no traumatic finding.  Will plan for outpatient follow-up.  Patient at this time appears safe and stable for discharge and close outpatient follow up. Discharge plan and strict return to ED precautions discussed, patient verbalizes understanding and agreement.     Final diagnoses:  None    ED Discharge Orders     None          Bari Roxie HERO, DO 07/09/24 2049

## 2024-07-10 ENCOUNTER — Ambulatory Visit: Payer: Self-pay

## 2024-07-10 NOTE — Telephone Encounter (Signed)
 FYI Only or Action Required?: FYI only for provider.  Patient was last seen in primary care on 03/02/2024 by Perri Ronal PARAS, MD.  Called Nurse Triage reporting Memory Loss.  Symptoms began several months ago.  Interventions attempted: Nothing.  Symptoms are: gradually worsening.  Triage Disposition: See PCP Within 2 Weeks  Patient/caregiver understands and will follow disposition?: Yes  Copied from CRM [id number]. Topic: Clinical - Red Word Triage >> Jul 10, 2024  3:31 PM Ivette P wrote: [facility] that prompted transfer to Nurse Triage: Memory Issues and recent falls, Yeserday. Reason for Disposition  New or worsening memory (forgetfulness) problems  Answer Assessment - Initial Assessment Questions 1. MAIN CONCERN OR SYMPTOM:  What is your main concern right now? What questions do you have? What's the main symptom you're worried about? (e.g., confusion, memory loss)     Memory loss  2. ONSET:  When did the symptom start (or worsen)? (minutes, hours, days, weeks)     Noticed around Sept 2024 and has been having some memory issues that did improve. The symptoms leveled out. But within the last 3 months she is becoming more forgetful  3. BETTER-SAME-WORSE: Are you (the patient) getting better, staying the same, or getting worse compared to the day you (they) were diagnosed or most recent hospital discharge?     Gradually worsening  4. DIAGNOSIS: Was the dementia diagnosed by a doctor? If Yes, ask: When? (e.g., days, months, years ago)     No diagnosed  5. MEDICINES: Has there been any change in medicines recently? (e.g., narcotics, antihistamines, benzodiazepines, etc.)     No  6. OTHER SYMPTOMS: Are there any other symptoms? (e.g., cough, falling, fever, pain)     Falling  7. SUPPORT: What type of support do you (the patient) have? Note: Document living circumstances and support (e.g., family, nursing home).     Has caregiver  Protocols used: Dementia Symptoms  and Questions-A-AH

## 2024-07-12 ENCOUNTER — Ambulatory Visit: Payer: Self-pay

## 2024-07-12 NOTE — Telephone Encounter (Signed)
 Pt daughter calling to reschedule for pt, states that pt cannot get there tomorrow.   Copied from CRM [id number]. Topic: Clinical - Red Word Triage >> Jul 10, 2024  3:31 PM Ivette P wrote: [facility] that prompted transfer to Nurse Triage: Memory Issues and recent falls, Yeserday. >> Jul 12, 2024  4:30 PM Tobias L wrote: Daughter requesting to reschedule appointment for patient to next week.

## 2024-07-13 ENCOUNTER — Ambulatory Visit: Admitting: Internal Medicine

## 2024-07-18 ENCOUNTER — Encounter: Payer: Self-pay | Admitting: Internal Medicine

## 2024-07-18 ENCOUNTER — Ambulatory Visit (INDEPENDENT_AMBULATORY_CARE_PROVIDER_SITE_OTHER): Admitting: Internal Medicine

## 2024-07-18 VITALS — BP 130/80 | HR 68 | Ht 60.0 in | Wt 148.0 lb

## 2024-07-18 DIAGNOSIS — R82998 Other abnormal findings in urine: Secondary | ICD-10-CM

## 2024-07-18 DIAGNOSIS — W19XXXD Unspecified fall, subsequent encounter: Secondary | ICD-10-CM

## 2024-07-18 DIAGNOSIS — R5383 Other fatigue: Secondary | ICD-10-CM | POA: Diagnosis not present

## 2024-07-18 DIAGNOSIS — D649 Anemia, unspecified: Secondary | ICD-10-CM | POA: Diagnosis not present

## 2024-07-18 DIAGNOSIS — R7302 Impaired glucose tolerance (oral): Secondary | ICD-10-CM | POA: Diagnosis not present

## 2024-07-18 DIAGNOSIS — G4733 Obstructive sleep apnea (adult) (pediatric): Secondary | ICD-10-CM

## 2024-07-18 DIAGNOSIS — E119 Type 2 diabetes mellitus without complications: Secondary | ICD-10-CM

## 2024-07-18 DIAGNOSIS — R319 Hematuria, unspecified: Secondary | ICD-10-CM

## 2024-07-18 DIAGNOSIS — F439 Reaction to severe stress, unspecified: Secondary | ICD-10-CM

## 2024-07-18 DIAGNOSIS — Z09 Encounter for follow-up examination after completed treatment for conditions other than malignant neoplasm: Secondary | ICD-10-CM | POA: Diagnosis not present

## 2024-07-18 DIAGNOSIS — Z955 Presence of coronary angioplasty implant and graft: Secondary | ICD-10-CM | POA: Diagnosis not present

## 2024-07-18 DIAGNOSIS — I252 Old myocardial infarction: Secondary | ICD-10-CM | POA: Diagnosis not present

## 2024-07-18 DIAGNOSIS — R413 Other amnesia: Secondary | ICD-10-CM | POA: Diagnosis not present

## 2024-07-18 DIAGNOSIS — I10 Essential (primary) hypertension: Secondary | ICD-10-CM

## 2024-07-18 DIAGNOSIS — F32A Depression, unspecified: Secondary | ICD-10-CM

## 2024-07-18 LAB — POCT URINALYSIS DIP (CLINITEK)
Bilirubin, UA: NEGATIVE
Glucose, UA: NEGATIVE mg/dL
Ketones, POC UA: NEGATIVE mg/dL
Nitrite, UA: NEGATIVE
POC PROTEIN,UA: 30 — AB
Spec Grav, UA: 1.015 (ref 1.010–1.025)
Urobilinogen, UA: 0.2 U/dL
pH, UA: 6 (ref 5.0–8.0)

## 2024-07-18 NOTE — Progress Notes (Signed)
 Patient Care Team: Perri Ronal PARAS, MD as PCP - General (Internal Medicine) Wonda Sharper, MD as PCP - Cardiology (Cardiology) Perri Ronal PARAS, MD (Internal Medicine)  Visit Date: 07/18/24  Subjective:   Chief Complaint  Patient presents with   Fall   Memory Loss   Patient PI:Carrie Chung,Female DOB:1941/08/31,82 y.o. MRN:[id number]   82 y.o.Female, here with her daughter, presents today for ED Follow-Up from 07/09/2024 for Fall & Head Injury. Patient has a past medical history of Anticoagulation Use on Plavix  75 mg daily. She initially was seen at an UC for an UTI, and they suggested she get evaluated due to her frontal bruising and anticoagulation use. According to the ED note, there was no LOC and no other associated injuries. On exam there was mild bruising to the right frontal forehead, otherwise exam normal. Imaging non-remarkable for any acute abnormalities, noted stable atrophy, chronic small vessel ischemia, and mild degenerative changes in the mid to lower cervical spine. She was subsequently discharged about 3 hours after presentation. Today, denies further falls since discharge, but mentions she has been fatigued lately.  Regarding her recent UTI, she denies burning/stinging dysuria or frequency.  Discussed Health Maintenance: she is agreeable to having a mammogram done. Could not provide an urine sample today for uACR, however will at next OV. AWV upcoming in September. Past Medical History:  Diagnosis Date   Atrial tachycardia (HCC)    a. noted at cardiac rehab 5/13; event monitor ordered to assess for AFib   CAD (coronary artery disease)    a. NSTEMI 03/2012 (LHC 03/27/12: pLAD 30%, mLAD 99%, mCFX 95%, mRCA 99% with thrombus, EF 65%);  s/p PTCA/DESx1 to prox-mid RCA 03/27/12 urgently in setting of hypotension/bradycardia, with staged PTCA/Evolve study stent to mid LAD & PTCA/Evolve study stent to prox LCx 03/29/12 ;   echo 03/28/12:EF 60%, Aortic sclerosis without AS, mild RVE,  mild reduced RVSF     Endometrial polyp    HTN (hypertension)    Hyperlipidemia    MI, old    Sleep related hypoxia     Allergies  Allergen Reactions   Penicillins Hives, Swelling and Other (See Comments)   Sulfonamide Derivatives Hives and Swelling   Lactose Intolerance (Gi) Diarrhea   Sulfamethoxazole Swelling    Family History  Problem Relation Age of Onset   Coronary artery disease Mother        CABG at age 29, PPM   Colon cancer Father    Cancer - Other Father        larnyx   Social History   Social History Narrative   Caffeine 2 cups tea.  Married. Husband has dementia. One adult daughter. Review of Systems  Constitutional:  Positive for malaise/fatigue.  Musculoskeletal:  Positive for falls (w/ associated minor head injury).  Neurological:  Negative for loss of consciousness.     Objective:  Vitals: BP 130/80   Pulse 68   Ht 5' (1.524 m)   Wt 148 lb (67.1 kg)   SpO2 96%   BMI 28.90 kg/m   Physical Exam Vitals and nursing note reviewed.  Constitutional:      General: She is not in acute distress.    Appearance: Normal appearance. She is not toxic-appearing.  HENT:     Head: Normocephalic. Contusion (residual bruising of right frontal forehead) present.  Cardiovascular:     Rate and Rhythm: Normal rate and regular rhythm. No extrasystoles are present.    Pulses: Normal pulses.  Heart sounds: Normal heart sounds. No murmur heard.    No friction rub. No gallop.  Pulmonary:     Effort: Pulmonary effort is normal. No respiratory distress.     Breath sounds: Normal breath sounds. No wheezing or rales.  Skin:    General: Skin is warm and dry.  Neurological:     Mental Status: She is alert and oriented to person, place, and time. Mental status is at baseline.  Psychiatric:        Mood and Affect: Mood normal.        Behavior: Behavior normal.        Thought Content: Thought content normal.        Judgment: Judgment normal.     Results:  Studies  Obtained And Personally Reviewed By Me:  CT Head Wo Contrast 07/09/2024 COMPARISON:  10/16/2023  FINDINGS:  Brain: No intracranial hemorrhage, mass effect, or midline shift. Stable degree of atrophy and chronic small vessel ischemia. No hydrocephalus. The basilar cisterns are patent. No evidence of territorial infarct or acute ischemia. No extra-axial or intracranial fluid collection.   Vascular: Atherosclerosis of skullbase vasculature without hyperdense vessel or abnormal calcification.   Skull: No fracture or focal lesion.   Sinuses/Orbits: No fracture or focal lesion.   Other: None.  IMPRESSION: 1. No acute intracranial abnormality. No skull fracture. 2. Stable atrophy and chronic small vessel ischemia.   CT Cervical Spine Wo Contrast 07/09/2024 COMPARISON: None available.  FINDINGS, CERVICAL SPINE:  BONES AND ALIGNMENT: No acute fracture or traumatic malalignment.   DEGENERATIVE CHANGES: Mild degenerative changes in the mid to lower cervical spine.   SOFT TISSUES: No prevertebral soft tissue swelling.  IMPRESSION: 1. No traumatic injury to the cervical spine.    Labs:     Component Value Date/Time   NA 140 10/16/2023 1331   NA 142 05/13/2022 1142   K 3.4 (L) 10/16/2023 1331   CL 99 10/16/2023 1331   CO2 31 10/16/2023 1331   GLUCOSE 110 (H) 10/16/2023 1331   BUN 8 10/16/2023 1331   BUN 11 05/13/2022 1142   CREATININE 0.58 10/16/2023 1331   CREATININE 0.78 08/30/2023 0926   CALCIUM  9.5 10/16/2023 1331   PROT 6.9 02/29/2024 1017   PROT 6.7 12/29/2017 1427   ALBUMIN 4.1 10/16/2023 1331   ALBUMIN 4.2 12/29/2017 1427   AST 16 02/29/2024 1017   ALT 11 02/29/2024 1017   ALKPHOS 105 10/16/2023 1331   BILITOT 0.5 02/29/2024 1017   BILITOT 0.3 12/29/2017 1427   GFRNONAA >60 10/16/2023 1331   GFRNONAA 68 08/16/2020 1156   GFRAA 79 08/16/2020 1156    Lab Results  Component Value Date   WBC 4.2 10/16/2023   HGB 12.6 10/16/2023   HCT 39.3 10/16/2023   MCV 91.0 10/16/2023    PLT 179 10/16/2023   Lab Results  Component Value Date   CHOL 155 02/29/2024   HDL 63 02/29/2024   LDLCALC 68 02/29/2024   LDLDIRECT 152.4 03/09/2012   TRIG 162 (H) 02/29/2024   CHOLHDL 2.5 02/29/2024   Lab Results  Component Value Date   HGBA1C 6.6 (H) 02/29/2024    Lab Results  Component Value Date   TSH 2.85 08/30/2023   Results for orders placed or performed in visit on 07/18/24  POCT URINALYSIS DIP (CLINITEK)  Result Value Ref Range   Color, UA yellow yellow   Clarity, UA clear clear   Glucose, UA negative negative mg/dL   Bilirubin, UA negative negative   Ketones,  POC UA negative negative mg/dL   Spec Grav, UA 8.984 8.989 - 1.025   Blood, UA trace-lysed (A) negative   pH, UA 6.0 5.0 - 8.0   POC PROTEIN,UA =30 (A) negative, trace   Urobilinogen, UA 0.2 0.2 or 1.0 E.U./dL   Nitrite, UA Negative Negative   Leukocytes, UA Trace (A) Negative   Assessment & Plan:   Orders Placed This Encounter  Procedures   Urine Culture   CBC with Differential/Platelet   COMPLETE METABOLIC PANEL WITHOUT GFR   TSH   Iron, TIBC and Ferritin Panel   B12 and Folate Panel   Brain natriuretic peptide   POCT URINALYSIS DIP (CLINITEK)   ED Follow-Up from 07/09/2024 for Fall & Head Injury: Reviewed ED note and imaging which was overall reassuring. Today, she denies further falls since discharge.   Fatigue; Anemia: Hx of Anemia in 2024. She mentions being fatigued lately.  Ordering CBC, Iron Panel, and B12/Folate Panel to r/o anemia or B12/folate deficiency.   B12 low normal at 341. Iron level low normal at 69  Take over the counter vitamins B12 and folate. Take potassium supplement. Potassium is slightly low  TSH is normal  History of Myocardial Infarction; H/O Right Coronary Artery Stent Placement; Anticoagulation Use on Plavix  75 mg daily; Essential Hypertension treated with HCTZ 25 mg daily and Metoprolol  tartrate 50 mg twice daily. Blood Pressure today normo-tensive at 130/80.   Ordering BNP.  Hematuria; Leukocytes In Urine: regarding her recent Urinary Tract Infection, she denies burning/stinging dysuria or frequency. She provided a urine specimen today for confirmation of resolution - UA slightly leukocytic, proteinuric, with trace blood. Ordering culture. Results pending   Mild Memory loss: has assisted in-home care and support from her daughter.  Type 2 diabetes: Hgb AIC is 6.6%    Discussed Health Maintenance she is agreeable to having a Mammogram done.   Glucose Intolerance (Impaired Glucose Tolerance); Type 2 Diabetes Mellitus w/o Long-Term Current Use Of Insulin : provided an urine sample today for uACR. A1c was 6.6 in March 2025. Ordering C-Met.  Return in about 7 weeks (around 09/06/2024) for annual labs and annual visit, or as needed.    I,Emily Lagle,acting as a Neurosurgeon for Ronal JINNY Hailstone, MD.,have documented all relevant documentation on the behalf of Ronal JINNY Hailstone, MD,as directed by  Ronal JINNY Hailstone, MD while in the presence of Ronal JINNY Hailstone, MD.   I, Ronal JINNY Hailstone, MD, have reviewed all documentation for this visit. The documentation on 07/20/24 for the exam, diagnosis, procedures, and orders are all accurate and complete.

## 2024-07-19 ENCOUNTER — Ambulatory Visit: Payer: Self-pay | Admitting: Internal Medicine

## 2024-07-19 DIAGNOSIS — N390 Urinary tract infection, site not specified: Secondary | ICD-10-CM

## 2024-07-19 LAB — BRAIN NATRIURETIC PEPTIDE: Brain Natriuretic Peptide: 106 pg/mL — ABNORMAL HIGH (ref <100)

## 2024-07-19 MED ORDER — POTASSIUM CHLORIDE CRYS ER 10 MEQ PO TBCR: 10.0000 meq | EXTENDED_RELEASE_TABLET | Freq: Every day | ORAL | 1 refills | Status: DC

## 2024-07-19 NOTE — Progress Notes (Incomplete)
 Patient Care Team: Perri Ronal PARAS, MD as PCP - General (Internal Medicine) Wonda Sharper, MD as PCP - Cardiology (Cardiology) Perri Ronal PARAS, MD (Internal Medicine)  Visit Date: 07/18/24  Subjective:   Chief Complaint  Patient presents with  . Fall  . Memory Loss   Patient PI:Carrie Chung,Female DOB:[birth year],82 y.o. MRN:[id number]   82 y.o.Female, here with her daughter, presents today for ED Follow-Up from 07/09/2024 for Fall & Head Injury. Patient has a past medical history of Anticoagulation Use on Plavix  75 mg daily. She initially was seen at an UC for an UTI, and they suggested she get evaluated due to her frontal bruising and anticoagulation use. According to the ED note, there was no LOC and no other associated injuries. On exam there was mild bruising to the right frontal forehead, otherwise exam normal. Imaging non-remarkable for any acute abnormalities, noted stable atrophy, chronic small vessel ischemia, and mild degenerative changes in the mid to lower cervical spine. She was subsequently discharged about 3 hours after presentation. Today, denies further falls since discharge, but mentions she has been fatigued lately.  Regarding her recent UTI, she denies burning/stinging dysuria or frequency.  Discussed Health Maintenance: she is agreeable to having a mammogram done. Could not provide an urine sample today for uACR, however will at next OV. AWV upcoming in September. Past Medical History:  Diagnosis Date  . Atrial tachycardia (HCC)    a. noted at cardiac rehab 5/13; event monitor ordered to assess for AFib  . CAD (coronary artery disease)    a. NSTEMI 03/2012 (LHC 03/27/12: pLAD 30%, mLAD 99%, mCFX 95%, mRCA 99% with thrombus, EF 65%);  s/p PTCA/DESx1 to prox-mid RCA 03/27/12 urgently in setting of hypotension/bradycardia, with staged PTCA/Evolve study stent to mid LAD & PTCA/Evolve study stent to prox LCx 03/29/12 ;   echo 03/28/12:EF 60%, Aortic sclerosis without AS, mild  RVE, mild reduced RVSF    . Endometrial polyp   . HTN (hypertension)   . Hyperlipidemia   . MI, old   . Sleep related hypoxia     Allergies  Allergen Reactions  . Penicillins Hives, Swelling and Other (See Comments)  . Sulfonamide Derivatives Hives and Swelling  . Lactose Intolerance (Gi) Diarrhea  . Sulfamethoxazole Swelling    Family History  Problem Relation Age of Onset  . Coronary artery disease Mother        CABG at age 14, PPM  . Colon cancer Father   . Cancer - Other Father        larnyx   Social History   Social History Narrative   Caffeine 2 cups tea.  Married. Husband has dementia. One adult daughter. Review of Systems  Constitutional:  Positive for malaise/fatigue.  Musculoskeletal:  Positive for falls (w/ associated minor head injury).  Neurological:  Negative for loss of consciousness.     Objective:  Vitals: BP 130/80   Pulse 68   Ht 5' (1.524 m)   Wt 148 lb (67.1 kg)   SpO2 96%   BMI 28.90 kg/m   Physical Exam Vitals and nursing note reviewed.  Constitutional:      General: She is not in acute distress.    Appearance: Normal appearance. She is not toxic-appearing.  HENT:     Head: Normocephalic. Contusion (residual bruising of right frontal forehead) present.  Cardiovascular:     Rate and Rhythm: Normal rate and regular rhythm. No extrasystoles are present.    Pulses: Normal pulses.  Heart sounds: Normal heart sounds. No murmur heard.    No friction rub. No gallop.  Pulmonary:     Effort: Pulmonary effort is normal. No respiratory distress.     Breath sounds: Normal breath sounds. No wheezing or rales.  Skin:    General: Skin is warm and dry.  Neurological:     Mental Status: She is alert and oriented to person, place, and time. Mental status is at baseline.  Psychiatric:        Mood and Affect: Mood normal.        Behavior: Behavior normal.        Thought Content: Thought content normal.        Judgment: Judgment normal.      Results:  Studies Obtained And Personally Reviewed By Me:  CT Head Wo Contrast 07/09/2024 COMPARISON:  10/16/2023  FINDINGS:  Brain: No intracranial hemorrhage, mass effect, or midline shift. Stable degree of atrophy and chronic small vessel ischemia. No hydrocephalus. The basilar cisterns are patent. No evidence of territorial infarct or acute ischemia. No extra-axial or intracranial fluid collection.   Vascular: Atherosclerosis of skullbase vasculature without hyperdense vessel or abnormal calcification.   Skull: No fracture or focal lesion.   Sinuses/Orbits: No fracture or focal lesion.   Other: None.  IMPRESSION: 1. No acute intracranial abnormality. No skull fracture. 2. Stable atrophy and chronic small vessel ischemia.   CT Cervical Spine Wo Contrast 07/09/2024 COMPARISON: None available.  FINDINGS, CERVICAL SPINE:  BONES AND ALIGNMENT: No acute fracture or traumatic malalignment.   DEGENERATIVE CHANGES: Mild degenerative changes in the mid to lower cervical spine.   SOFT TISSUES: No prevertebral soft tissue swelling.  IMPRESSION: 1. No traumatic injury to the cervical spine.    Labs:     Component Value Date/Time   NA 140 10/16/2023 1331   NA 142 05/13/2022 1142   K 3.4 (L) 10/16/2023 1331   CL 99 10/16/2023 1331   CO2 31 10/16/2023 1331   GLUCOSE 110 (H) 10/16/2023 1331   BUN 8 10/16/2023 1331   BUN 11 05/13/2022 1142   CREATININE 0.58 10/16/2023 1331   CREATININE 0.78 08/30/2023 0926   CALCIUM  9.5 10/16/2023 1331   PROT 6.9 02/29/2024 1017   PROT 6.7 12/29/2017 1427   ALBUMIN 4.1 10/16/2023 1331   ALBUMIN 4.2 12/29/2017 1427   AST 16 02/29/2024 1017   ALT 11 02/29/2024 1017   ALKPHOS 105 10/16/2023 1331   BILITOT 0.5 02/29/2024 1017   BILITOT 0.3 12/29/2017 1427   GFRNONAA >60 10/16/2023 1331   GFRNONAA 68 08/16/2020 1156   GFRAA 79 08/16/2020 1156    Lab Results  Component Value Date   WBC 4.2 10/16/2023   HGB 12.6 10/16/2023   HCT 39.3 10/16/2023    MCV 91.0 10/16/2023   PLT 179 10/16/2023   Lab Results  Component Value Date   CHOL 155 02/29/2024   HDL 63 02/29/2024   LDLCALC 68 02/29/2024   LDLDIRECT 152.4 03/09/2012   TRIG 162 (H) 02/29/2024   CHOLHDL 2.5 02/29/2024   Lab Results  Component Value Date   HGBA1C 6.6 (H) 02/29/2024    Lab Results  Component Value Date   TSH 2.85 08/30/2023   Results for orders placed or performed in visit on 07/18/24  POCT URINALYSIS DIP (CLINITEK)  Result Value Ref Range   Color, UA yellow yellow   Clarity, UA clear clear   Glucose, UA negative negative mg/dL   Bilirubin, UA negative negative   Ketones,  POC UA negative negative mg/dL   Spec Grav, UA 8.984 8.989 - 1.025   Blood, UA trace-lysed (A) negative   pH, UA 6.0 5.0 - 8.0   POC PROTEIN,UA =30 (A) negative, trace   Urobilinogen, UA 0.2 0.2 or 1.0 E.U./dL   Nitrite, UA Negative Negative   Leukocytes, UA Trace (A) Negative   Assessment & Plan:   Orders Placed This Encounter  Procedures  . Urine Culture  . CBC with Differential/Platelet  . COMPLETE METABOLIC PANEL WITHOUT GFR  . TSH  . Iron, TIBC and Ferritin Panel  . B12 and Folate Panel  . Brain natriuretic peptide  . POCT URINALYSIS DIP (CLINITEK)   ED Follow-Up from 07/09/2024 for Fall & Head Injury: Reviewed ED note and imaging which was overall reassuring. Today, she denies further falls since discharge.   Fatigue; Anemia: Hx of Anemia in 2024. She mentions being fatigued lately.  Ordering CBC, Iron Panel, and B12/Folate Panel to r/o anemia or B12/folate deficiency.   Take over the counter vitamins B12 and folate. Take potassium supplement.  History of Myocardial Infarction; H/O Right Coronary Artery Stent Placement; Anticoagulation Use on Plavix  75 mg daily; Essential Hypertension treated with HCTZ 25 mg daily and Metoprolol  tartrate 50 mg twice daily. Blood Pressure today normo-tensive at 130/80.  Ordering BNP.  Hematuria; Leukocytes In Urine: regarding her  recent Urinary Tract Infection, she denies burning/stinging dysuria or frequency. She provided a urine specimen today for confirmation of resolution - UA slightly leukocytic, proteinuric, with trace blood. Ordering culture. Results pending   Mild Memory loss: has assisted in-home care and support from her daughter.  Discussed Health Maintenance she is agreeable to having a Mammogram done.   Glucose Intolerance (Impaired Glucose Tolerance); Type 2 Diabetes Mellitus w/o Long-Term Current Use Of Insulin : provided an urine sample today for uACR. A1c was 6.6 in March 2025. Ordering C-Met.  Return in about 7 weeks (around 09/06/2024) for annual labs and annual visit, or as needed.    I,Emily Lagle,acting as a Neurosurgeon for Ronal JINNY Hailstone, MD.,have documented all relevant documentation on the behalf of Ronal JINNY Hailstone, MD,as directed by  Ronal JINNY Hailstone, MD while in the presence of Ronal JINNY Hailstone, MD.   ***

## 2024-07-19 NOTE — Patient Instructions (Signed)
 Checking urine for infection as there is a small amout of leukocytes in urine. Checking serum iron. Checking for B12 and folate deficiencies. Neuro exam is in tact. Mild memory loss present before fall.

## 2024-07-20 LAB — CBC WITH DIFFERENTIAL/PLATELET
Absolute Lymphocytes: 1118 {cells}/uL (ref 850–3900)
Absolute Monocytes: 513 {cells}/uL (ref 200–950)
Basophils Absolute: 32 {cells}/uL (ref 0–200)
Basophils Relative: 0.6 %
Eosinophils Absolute: 92 {cells}/uL (ref 15–500)
Eosinophils Relative: 1.7 %
HCT: 42.6 % (ref 35.0–45.0)
Hemoglobin: 13.7 g/dL (ref 11.7–15.5)
MCH: 29.5 pg (ref 27.0–33.0)
MCHC: 32.2 g/dL (ref 32.0–36.0)
MCV: 91.8 fL (ref 80.0–100.0)
MPV: 10.4 fL (ref 7.5–12.5)
Monocytes Relative: 9.5 %
Neutro Abs: 3645 {cells}/uL (ref 1500–7800)
Neutrophils Relative %: 67.5 %
Platelets: 193 Thousand/uL (ref 140–400)
RBC: 4.64 Million/uL (ref 3.80–5.10)
RDW: 12.9 % (ref 11.0–15.0)
Total Lymphocyte: 20.7 %
WBC: 5.4 Thousand/uL (ref 3.8–10.8)

## 2024-07-20 LAB — COMPLETE METABOLIC PANEL WITHOUT GFR
AG Ratio: 2 (calc) (ref 1.0–2.5)
ALT: 10 U/L (ref 6–29)
AST: 16 U/L (ref 10–35)
Albumin: 4.5 g/dL (ref 3.6–5.1)
Alkaline phosphatase (APISO): 72 U/L (ref 37–153)
BUN: 13 mg/dL (ref 7–25)
CO2: 36 mmol/L — ABNORMAL HIGH (ref 20–32)
Calcium: 9.8 mg/dL (ref 8.6–10.4)
Chloride: 91 mmol/L — ABNORMAL LOW (ref 98–110)
Creat: 0.72 mg/dL (ref 0.60–0.95)
Globulin: 2.3 g/dL (ref 1.9–3.7)
Glucose, Bld: 87 mg/dL (ref 65–99)
Potassium: 3.3 mmol/L — ABNORMAL LOW (ref 3.5–5.3)
Sodium: 140 mmol/L (ref 135–146)
Total Bilirubin: 0.5 mg/dL (ref 0.2–1.2)
Total Protein: 6.8 g/dL (ref 6.1–8.1)

## 2024-07-20 LAB — IRON,TIBC AND FERRITIN PANEL
%SAT: 18 % (ref 16–45)
Ferritin: 17 ng/mL (ref 16–288)
Iron: 69 ug/dL (ref 45–160)
TIBC: 387 ug/dL (ref 250–450)

## 2024-07-20 LAB — B12 AND FOLATE PANEL
Folate: 3.8 ng/mL — ABNORMAL LOW
Vitamin B-12: 341 pg/mL (ref 200–1100)

## 2024-07-20 LAB — TSH: TSH: 2.56 m[IU]/L (ref 0.40–4.50)

## 2024-07-21 ENCOUNTER — Other Ambulatory Visit: Payer: Self-pay

## 2024-07-21 LAB — URINE CULTURE
MICRO NUMBER:: 16759370
SPECIMEN QUALITY:: ADEQUATE

## 2024-07-21 MED ORDER — FLUCONAZOLE 150 MG PO TABS: 150.0000 mg | ORAL_TABLET | Freq: Once | ORAL | 0 refills | Status: AC

## 2024-07-21 MED ORDER — LEVOFLOXACIN 250 MG PO TABS: 250.0000 mg | ORAL_TABLET | Freq: Every day | ORAL | 0 refills | Status: DC

## 2024-07-21 NOTE — Telephone Encounter (Signed)
 Has E. Coli on urine culture 49,000 col/ml. Will treat with low dose Levaquin 250 mg daily for 7 days. Will need to collect a clean catch urine in about 7 to 10 days.

## 2024-07-27 NOTE — Progress Notes (Signed)
 Patient Care Team: Perri Ronal PARAS, MD as PCP - General (Internal Medicine) Wonda Sharper, MD as PCP - Cardiology (Cardiology) Perri Ronal PARAS, MD (Internal Medicine)  Visit Date: 07/28/24  Subjective:   Chief Complaint  Patient presents with   Urinary Tract Infection   Patient PI:Carrie Chung,Female DOB:16-Apr-1941,82 y.o. MRN:[id number]   82 y.o.Female presents today for 10 day follow-up on E. Coli UTI, for which she was seen initially on 07/18/2024. Patient has a past medical history of HTN; HLD; Diabetes. That visit she was primarily seen for an ED follow-up. UA that day was slightly leukocytic, proteinuric, with trace blood and culture showed E.coli. Following results Levaquin  500 mg x7 days was sent in, and she returns today to confirm resolution.  UA still slightly leukocytic and proteinuric today, but despite this she reportedly does feel much improved. She says that she finished her antibiotics yesterday and took her Diflucan .  Past Medical History:  Diagnosis Date   Atrial tachycardia (HCC)    a. noted at cardiac rehab 5/13; event monitor ordered to assess for AFib   CAD (coronary artery disease)    a. NSTEMI 03/2012 ([facility] 03/27/12: pLAD 30%, mLAD 99%, mCFX 95%, mRCA 99% with thrombus, EF 65%);  s/p PTCA/DESx1 to prox-mid RCA 03/27/12 urgently in setting of hypotension/bradycardia, with staged PTCA/Evolve study stent to mid LAD & PTCA/Evolve study stent to prox LCx 03/29/12 ;   echo 03/28/12:EF 60%, Aortic sclerosis without AS, mild RVE, mild reduced RVSF     Endometrial polyp    HTN (hypertension)    Hyperlipidemia    MI, old    Sleep related hypoxia     Allergies  Allergen Reactions   Penicillins Hives, Swelling and Other (See Comments)   Sulfonamide Derivatives Hives and Swelling   Lactose Intolerance (Gi) Diarrhea   Sulfamethoxazole Swelling   Immunization History  Administered Date(s) Administered   Influenza Split 09/09/2013   Influenza, High Dose Seasonal PF  10/23/2016, 08/26/2017, 09/08/2018, 08/30/2019   Influenza,inj,Quad PF,6+ Mos 09/16/2015   Influenza-Unspecified 09/20/2014, 08/26/2017, 08/21/2018   Moderna Sars-Covid-2 Vaccination 01/04/2020, 02/01/2020, 10/14/2020   Pneumococcal Conjugate-13 07/16/2015   Pneumococcal Polysaccharide-23 03/28/2012   Tdap 04/28/2012   Past Surgical History:  Procedure Laterality Date   APPENDECTOMY  83 years old   CAROTID STENT  04/2012   x3   CESAREAN SECTION  1984   CHOLECYSTECTOMY  1978   FEMUR IM NAIL Right 09/21/2023   Procedure: INTRAMEDULLARY (IM) NAIL FEMORAL;  Surgeon: Beverley Evalene BIRCH, MD;  Location: [facility];  Service: Orthopedics;  Laterality: Right;   INTRAMEDULLARY (IM) NAIL INTERTROCHANTERIC Left 06/20/2021   Procedure: INTRAMEDULLARY (IM) NAIL INTERTROCHANTRIC;  Surgeon: Beverley Evalene BIRCH, MD;  Location: [facility] OR;  Service: Orthopedics;  Laterality: Left;   LEFT HEART CATHETERIZATION WITH CORONARY ANGIOGRAM N/A 03/27/2012   Procedure: LEFT HEART CATHETERIZATION WITH CORONARY ANGIOGRAM;  Surgeon: Lonni BIRCH Cash, MD;  Location: [facility] CATH LAB;  Service: Cardiovascular;  Laterality: N/A;   PERCUTANEOUS CORONARY STENT INTERVENTION (PCI-S) N/A 03/27/2012   Procedure: PERCUTANEOUS CORONARY STENT INTERVENTION (PCI-S);  Surgeon: Lonni BIRCH Cash, MD;  Location: [facility] CATH LAB;  Service: Cardiovascular;  Laterality: N/A;   PERCUTANEOUS CORONARY STENT INTERVENTION (PCI-S) N/A 03/29/2012   Procedure: PERCUTANEOUS CORONARY STENT INTERVENTION (PCI-S);  Surgeon: Sharper Wonda, MD;  Location: [facility] CATH LAB;  Service: Cardiovascular;  Laterality: N/A;   TONSILLECTOMY  83 years old    Family History  Problem Relation Age of Onset   Coronary artery disease Mother  CABG at age 73, PPM   Colon cancer Father    Cancer - Other Father        larnyx   Social History   Social History Narrative   Caffeine 2 cups tea.  Married. One adult daughter who is an Pensions consultant. Husband has dementia. Review of  Systems  Constitutional:  Negative for fever and malaise/fatigue.  HENT:  Negative for congestion.   Eyes:  Negative for blurred vision.  Respiratory:  Negative for cough and shortness of breath.   Cardiovascular:  Negative for chest pain, palpitations and leg swelling.  Gastrointestinal:  Negative for vomiting.  Musculoskeletal:  Negative for back pain.  Skin:  Negative for rash.  Neurological:  Negative for loss of consciousness and headaches.     Objective:  Vitals: Ht 5' (1.524 m)   BMI 28.90 kg/m   Physical Exam Vitals and nursing note reviewed.  Constitutional:      General: She is not in acute distress.    Appearance: Normal appearance. She is not toxic-appearing.  HENT:     Head: Normocephalic and atraumatic.  Pulmonary:     Effort: Pulmonary effort is normal.  Skin:    General: Skin is warm and dry.  Neurological:     Mental Status: She is alert and oriented to person, place, and time. Mental status is at baseline.  Psychiatric:        Mood and Affect: Mood normal.        Behavior: Behavior normal.        Thought Content: Thought content normal.        Judgment: Judgment normal.     Results:  Studies Obtained And Personally Reviewed By Me: Labs:  CBC w/ Differential Lab Results  Component Value Date   WBC 5.4 07/18/2024   RBC 4.64 07/18/2024   HGB 13.7 07/18/2024   HCT 42.6 07/18/2024   PLT 193 07/18/2024   MCV 91.8 07/18/2024   MCH 29.5 07/18/2024   MCHC 32.2 07/18/2024   RDW 12.9 07/18/2024   MPV 10.4 07/18/2024   LYMPHSABS 1.1 10/16/2023   MONOABS 0.4 10/16/2023   BASOSABS 32 07/18/2024    Comprehensive Metabolic Panel Lab Results  Component Value Date   NA 140 07/18/2024   K 3.3 (L) 07/18/2024   CL 91 (L) 07/18/2024   CO2 36 (H) 07/18/2024   GLUCOSE 87 07/18/2024   BUN 13 07/18/2024   CREATININE 0.72 07/18/2024   CALCIUM  9.8 07/18/2024   PROT 6.8 07/18/2024   ALBUMIN 4.1 10/16/2023   AST 16 07/18/2024   ALT 10 07/18/2024   ALKPHOS  105 10/16/2023   BILITOT 0.5 07/18/2024   GFR 73.30 06/21/2015   EGFR 76 08/30/2023   GFRNONAA >60 10/16/2023   Lipid Panel  Lab Results  Component Value Date   CHOL 155 02/29/2024   HDL 63 02/29/2024   LDLCALC 68 02/29/2024   TRIG 162 (H) 02/29/2024   A1c Lab Results  Component Value Date   HGBA1C 6.6 (H) 02/29/2024    TSH Lab Results  Component Value Date   TSH 2.56 07/18/2024   Urinalysis & Culture Office Visit on 07/18/2024  Component Date Value Ref Range Status   Clarity, UA 07/18/2024 clear  clear Final   Glucose, UA 07/18/2024 negative  negative mg/dL Final   Bilirubin, UA 92/70/7974 negative  negative Final   Ketones, POC UA 07/18/2024 negative  negative mg/dL Final   Spec Grav, UA 92/70/7974 1.015  1.010 - 1.025 Final  Blood, UA 07/18/2024 trace-lysed (A)  negative Final   pH, UA 07/18/2024 6.0  5.0 - 8.0 Final   POC PROTEIN,UA 07/18/2024 =30 (A)  negative, trace Final   Urobilinogen, UA 07/18/2024 0.2  0.2 or 1.0 E.U./dL Final   Nitrite, UA 92/70/7974 Negative  Negative Final   Leukocytes, UA 07/18/2024 Trace (A)  Negative Final  Susceptibility data from last 90 days. Collected Specimen Info Organism AMOXICILLIN/CLAVULANATE AMPICILLIN/SULBACTAM CEFAZOLIN  CEFEPIME Ceftazidime CEFTRIAXONE  Ciprofloxacin  Gentamicin Susc lslt Imipenem LEVOFLOXACIN  Meropenem  07/18/24 Urine Escherichia coli  I  R  NR  S  S  S  S  S  S  S  S   Collected Specimen Info Organism Nitrofurantoin Susc lslt Piperacillin + Tazobactam Trimethoprim/Sulfa  07/18/24 Urine Escherichia coli  S  S  S   Results for orders placed or performed in visit on 07/28/24  POCT URINALYSIS DIP (CLINITEK)  Result Value Ref Range   Color, UA yellow yellow   Clarity, UA clear clear   Glucose, UA negative negative mg/dL   Bilirubin, UA negative negative   Ketones, POC UA negative negative mg/dL   Spec Grav, UA 8.989 8.989 - 1.025   Blood, UA negative negative   pH, UA 7.0 5.0 - 8.0   POC PROTEIN,UA =100  (A) negative, trace   Urobilinogen, UA 0.2 0.2 or 1.0 E.U./dL   Nitrite, UA Negative Negative   Leukocytes, UA Small (1+) (A) Negative   Assessment & Plan:   Orders Placed This Encounter  Procedures   Urine Culture   POCT URINALYSIS DIP (CLINITEK)   E. Coli UTI, for which she was seen initially on 07/18/2024. UA that day was slightly leukocytic, proteinuric, with trace blood and culture showed E.coli. Following results Levaquin  500 mg x7 days was sent in, and she returns today to confirm resolution. Unfortunately, UA still slightly leukocytic and proteinuric today, but despite this she reportedly does feel much improved. She says that she finished her antibiotics yesterday and took her Diflucan .  I will culture her urine today, but do not feel the need to send in any antibiotics as she is asymptomatic. Has small LE on urine dipstick.  Addendum: culture revealed no growth.  Type 2 Diabetes Mellitus Without Complication, Without Long-Term Current Use Of Insulin : Hgb AIC is 6.6% in 02/2024.   I,Emily Lagle,acting as a Neurosurgeon for Ronal JINNY Hailstone, MD.,have documented all relevant documentation on the behalf of Ronal JINNY Hailstone, MD,as directed by  Ronal JINNY Hailstone, MD while in the presence of Ronal JINNY Hailstone, MD.  I, Ronal JINNY Hailstone, MD, have reviewed all documentation for this visit. The documentation on 07/28/2024 for the exam, diagnosis, procedures, and orders are all accurate and complete.

## 2024-07-28 ENCOUNTER — Ambulatory Visit (INDEPENDENT_AMBULATORY_CARE_PROVIDER_SITE_OTHER): Admitting: Internal Medicine

## 2024-07-28 ENCOUNTER — Encounter: Payer: Self-pay | Admitting: Internal Medicine

## 2024-07-28 ENCOUNTER — Ambulatory Visit: Payer: Self-pay | Admitting: Internal Medicine

## 2024-07-28 VITALS — Ht 60.0 in

## 2024-07-28 DIAGNOSIS — F439 Reaction to severe stress, unspecified: Secondary | ICD-10-CM

## 2024-07-28 DIAGNOSIS — B9620 Unspecified Escherichia coli [E. coli] as the cause of diseases classified elsewhere: Secondary | ICD-10-CM

## 2024-07-28 DIAGNOSIS — N390 Urinary tract infection, site not specified: Secondary | ICD-10-CM

## 2024-07-28 DIAGNOSIS — E119 Type 2 diabetes mellitus without complications: Secondary | ICD-10-CM | POA: Diagnosis not present

## 2024-07-28 DIAGNOSIS — I10 Essential (primary) hypertension: Secondary | ICD-10-CM

## 2024-07-28 LAB — POCT URINALYSIS DIP (CLINITEK)
Bilirubin, UA: NEGATIVE
Blood, UA: NEGATIVE
Glucose, UA: NEGATIVE mg/dL
Ketones, POC UA: NEGATIVE mg/dL
Nitrite, UA: NEGATIVE
POC PROTEIN,UA: 100 — AB
Spec Grav, UA: 1.01 (ref 1.010–1.025)
Urobilinogen, UA: 0.2 U/dL
pH, UA: 7 (ref 5.0–8.0)

## 2024-07-30 ENCOUNTER — Encounter: Payer: Self-pay | Admitting: Internal Medicine

## 2024-07-30 LAB — URINE CULTURE
MICRO NUMBER:: 16806418
Result:: NO GROWTH
SPECIMEN QUALITY:: ADEQUATE

## 2024-07-30 NOTE — Patient Instructions (Addendum)
 We are re-culturing your urine due to the presence of a few white blood cells. Will notify you of culture results.  Addendum: culture has no growth

## 2024-08-07 ENCOUNTER — Other Ambulatory Visit: Payer: Self-pay

## 2024-08-07 ENCOUNTER — Emergency Department (HOSPITAL_COMMUNITY)

## 2024-08-07 ENCOUNTER — Emergency Department (HOSPITAL_COMMUNITY): Admission: EM | Admit: 2024-08-07 | Discharge: 2024-08-08 | Disposition: A | Discharge status: Home / Self Care

## 2024-08-07 ENCOUNTER — Encounter (HOSPITAL_COMMUNITY): Payer: Self-pay | Admitting: *Deleted

## 2024-08-07 DIAGNOSIS — F1090 Alcohol use, unspecified, uncomplicated: Secondary | ICD-10-CM | POA: Insufficient documentation

## 2024-08-07 DIAGNOSIS — I10 Essential (primary) hypertension: Secondary | ICD-10-CM | POA: Diagnosis not present

## 2024-08-07 DIAGNOSIS — R58 Hemorrhage, not elsewhere classified: Secondary | ICD-10-CM | POA: Diagnosis not present

## 2024-08-07 DIAGNOSIS — W01198A Fall on same level from slipping, tripping and stumbling with subsequent striking against other object, initial encounter: Secondary | ICD-10-CM | POA: Insufficient documentation

## 2024-08-07 DIAGNOSIS — S0990XA Unspecified injury of head, initial encounter: Secondary | ICD-10-CM

## 2024-08-07 DIAGNOSIS — R000 Tachycardia, unspecified: Secondary | ICD-10-CM | POA: Diagnosis not present

## 2024-08-07 DIAGNOSIS — M25551 Pain in right hip: Secondary | ICD-10-CM | POA: Diagnosis not present

## 2024-08-07 DIAGNOSIS — R791 Abnormal coagulation profile: Secondary | ICD-10-CM | POA: Insufficient documentation

## 2024-08-07 DIAGNOSIS — Y906 Blood alcohol level of 120-199 mg/100 ml: Secondary | ICD-10-CM | POA: Insufficient documentation

## 2024-08-07 DIAGNOSIS — S3993XA Unspecified injury of pelvis, initial encounter: Secondary | ICD-10-CM | POA: Diagnosis not present

## 2024-08-07 DIAGNOSIS — S0101XA Laceration without foreign body of scalp, initial encounter: Secondary | ICD-10-CM | POA: Diagnosis not present

## 2024-08-07 DIAGNOSIS — S72141D Displaced intertrochanteric fracture of right femur, subsequent encounter for closed fracture with routine healing: Secondary | ICD-10-CM | POA: Diagnosis not present

## 2024-08-07 DIAGNOSIS — S0003XA Contusion of scalp, initial encounter: Secondary | ICD-10-CM | POA: Diagnosis not present

## 2024-08-07 DIAGNOSIS — I700 Atherosclerosis of aorta: Secondary | ICD-10-CM | POA: Diagnosis not present

## 2024-08-07 DIAGNOSIS — S199XXA Unspecified injury of neck, initial encounter: Secondary | ICD-10-CM | POA: Diagnosis not present

## 2024-08-07 DIAGNOSIS — K760 Fatty (change of) liver, not elsewhere classified: Secondary | ICD-10-CM | POA: Diagnosis not present

## 2024-08-07 DIAGNOSIS — W19XXXA Unspecified fall, initial encounter: Secondary | ICD-10-CM

## 2024-08-07 DIAGNOSIS — R9389 Abnormal findings on diagnostic imaging of other specified body structures: Secondary | ICD-10-CM | POA: Diagnosis not present

## 2024-08-07 DIAGNOSIS — M47816 Spondylosis without myelopathy or radiculopathy, lumbar region: Secondary | ICD-10-CM | POA: Diagnosis not present

## 2024-08-07 DIAGNOSIS — M1711 Unilateral primary osteoarthritis, right knee: Secondary | ICD-10-CM | POA: Diagnosis not present

## 2024-08-07 DIAGNOSIS — Z23 Encounter for immunization: Secondary | ICD-10-CM | POA: Insufficient documentation

## 2024-08-07 DIAGNOSIS — S299XXA Unspecified injury of thorax, initial encounter: Secondary | ICD-10-CM | POA: Diagnosis not present

## 2024-08-07 LAB — PROTIME-INR
INR: 1 (ref 0.8–1.2)
Prothrombin Time: 13.7 s (ref 11.4–15.2)

## 2024-08-07 LAB — COMPREHENSIVE METABOLIC PANEL WITH GFR
ALT: 18 U/L (ref 0–44)
AST: 30 U/L (ref 15–41)
Albumin: 3.6 g/dL (ref 3.5–5.0)
Alkaline Phosphatase: 72 U/L (ref 38–126)
Anion gap: 17 — ABNORMAL HIGH (ref 5–15)
BUN: 9 mg/dL (ref 8–23)
CO2: 26 mmol/L (ref 22–32)
Calcium: 9.1 mg/dL (ref 8.9–10.3)
Chloride: 94 mmol/L — ABNORMAL LOW (ref 98–111)
Creatinine, Ser: 0.63 mg/dL (ref 0.44–1.00)
GFR, Estimated: >60 mL/min (ref >60)
Glucose, Bld: 152 mg/dL — ABNORMAL HIGH (ref 70–99)
Potassium: 2.9 mmol/L — ABNORMAL LOW (ref 3.5–5.1)
Sodium: 137 mmol/L (ref 135–145)
Total Bilirubin: 0.4 mg/dL (ref 0.0–1.2)
Total Protein: 6.4 g/dL — ABNORMAL LOW (ref 6.5–8.1)

## 2024-08-07 LAB — CBC
HCT: 42.5 % (ref 36.0–46.0)
Hemoglobin: 14.2 g/dL (ref 12.0–15.0)
MCH: 30.6 pg (ref 26.0–34.0)
MCHC: 33.4 g/dL (ref 30.0–36.0)
MCV: 91.6 fL (ref 80.0–100.0)
Platelets: 157 K/uL (ref 150–400)
RBC: 4.64 MIL/uL (ref 3.87–5.11)
RDW: 12.3 % (ref 11.5–15.5)
WBC: 5.3 K/uL (ref 4.0–10.5)
nRBC: 0 % (ref 0.0–0.2)

## 2024-08-07 LAB — I-STAT CHEM 8, ED
BUN: 9 mg/dL (ref 8–23)
Calcium, Ion: 0.99 mmol/L — ABNORMAL LOW (ref 1.15–1.40)
Chloride: 93 mmol/L — ABNORMAL LOW (ref 98–111)
Creatinine, Ser: 0.9 mg/dL (ref 0.44–1.00)
Glucose, Bld: 152 mg/dL — ABNORMAL HIGH (ref 70–99)
HCT: 42 % (ref 36.0–46.0)
Hemoglobin: 14.3 g/dL (ref 12.0–15.0)
Potassium: 3 mmol/L — ABNORMAL LOW (ref 3.5–5.1)
Sodium: 134 mmol/L — ABNORMAL LOW (ref 135–145)
TCO2: 26 mmol/L (ref 22–32)

## 2024-08-07 LAB — SAMPLE TO BLOOD BANK

## 2024-08-07 LAB — I-STAT CG4 LACTIC ACID, ED: Lactic Acid, Venous: 3.3 mmol/L (ref 0.5–1.9)

## 2024-08-07 LAB — ETHANOL: Alcohol, Ethyl (B): 136 mg/dL — ABNORMAL HIGH (ref <15)

## 2024-08-07 MED ORDER — TETANUS-DIPHTH-ACELL PERTUSSIS 5-2.5-18.5 LF-MCG/0.5 IM SUSY
0.5000 mL | PREFILLED_SYRINGE | Freq: Once | INTRAMUSCULAR | Status: AC
  Administered 2024-08-07: 0.5 mL via INTRAMUSCULAR
  Filled 2024-08-07: qty 0.5

## 2024-08-07 MED ORDER — METOPROLOL TARTRATE 25 MG PO TABS
50.0000 mg | ORAL_TABLET | Freq: Two times a day (BID) | ORAL | Status: DC
  Administered 2024-08-07: 50 mg via ORAL
  Filled 2024-08-07: qty 2

## 2024-08-07 MED ORDER — LIDOCAINE-EPINEPHRINE (PF) 2 %-1:200000 IJ SOLN
20.0000 mL | Freq: Once | INTRAMUSCULAR | Status: AC
  Filled 2024-08-07: qty 20

## 2024-08-07 MED ORDER — POTASSIUM CHLORIDE CRYS ER 20 MEQ PO TBCR
40.0000 meq | EXTENDED_RELEASE_TABLET | Freq: Once | ORAL | Status: AC
  Administered 2024-08-07: 40 meq via ORAL
  Filled 2024-08-07: qty 2

## 2024-08-07 MED ORDER — LACTATED RINGERS IV BOLUS
1000.0000 mL | Freq: Once | INTRAVENOUS | Status: AC
  Administered 2024-08-07: 1000 mL via INTRAVENOUS

## 2024-08-07 MED ORDER — LIDOCAINE-EPINEPHRINE (PF) 2 %-1:200000 IJ SOLN
INTRAMUSCULAR | Status: AC
  Filled 2024-08-07: qty 20

## 2024-08-07 NOTE — ED Triage Notes (Signed)
 Pt arrived with [facility] from home after a fall; on plavix ; active bleeding to top of head; admits to two glasses of wine. A&Ox4 with GCS 15 on arrival. EMS placed c-collar.   PA at bedside for suturing

## 2024-08-07 NOTE — ED Provider Notes (Signed)
 [facility] DEPT [facility] Emergency Department Provider Note MRN:  [id number]  Arrival date & time: 08/08/24     Chief Complaint   Fall   History of Present Illness   Carrie Chung is a 83 y.o. year-old female presents to the ED with chief complaint of fall.  Patient came in as a level 2 trauma, fall on thinners per department protocol.  She states that she had been drinking a couple glasses of wine tonight, missed a step, and fell striking her head on the corner of the wall.  EMS reports that there was significant bleeding/blood loss on the scene.  She denies LOC.  She complains of headache only.  Denies any numbness, weakness, or tingling in her extremities..  History provided by patient.   Review of Systems  Pertinent positive and negative review of systems noted in HPI.    Physical Exam   Vitals:   08/08/24 0415 08/08/24 0430  BP:  137/69  Pulse:  92  Resp: (!) 21   Temp:    SpO2:  95%    CONSTITUTIONAL:  non toxic-appearing, NAD NEURO:  Alert and oriented x 3, CN 3-12 grossly intact EYES:  eyes equal and reactive ENT/NECK:  Supple, no stridor, bleeding wound on top of scalp CARDIO:  mild tachycardia, regular rhythm, appears well-perfused  PULM:  No respiratory distress, CTAB GI/GU:  non-distended, no focal abdominal tenderness MSK/SPINE:  No gross deformities, no edema, moves all extremities  SKIN:  no rash, 4 cm scalp laceration   *Additional and/or pertinent findings included in MDM below  Diagnostic and Interventional Summary    EKG Interpretation Date/Time:  Monday August 07 2024 23:01:15 EDT Ventricular Rate:  97 PR Interval:  174 QRS Duration:  84 QT Interval:  309 QTC Calculation: 393 R Axis:   19  Text Interpretation: Sinus rhythm Atrial premature complex Borderline repolarization abnormality Baseline wander in lead(s) V6 Compared with previous EKG from 10/16/2023 Confirmed by Gennaro Bouchard ([id number]) on 08/07/2024 11:06:29 PM        Labs Reviewed  COMPREHENSIVE METABOLIC PANEL WITH GFR - Abnormal; Notable for the following components:      Result Value   Potassium 2.9 (*)    Chloride 94 (*)    Glucose, Bld 152 (*)    Total Protein 6.4 (*)    Anion gap 17 (*)    All other components within normal limits  ETHANOL - Abnormal; Notable for the following components:   Alcohol, Ethyl (B) 136 (*)    All other components within normal limits  URINALYSIS, ROUTINE W REFLEX MICROSCOPIC - Abnormal; Notable for the following components:   Color, Urine COLORLESS (*)    Specific Gravity, Urine 1.004 (*)    Hgb urine dipstick MODERATE (*)    Bacteria, UA RARE (*)    All other components within normal limits  CBC - Abnormal; Notable for the following components:   Platelets 132 (*)    All other components within normal limits  I-STAT CHEM 8, ED - Abnormal; Notable for the following components:   Sodium 134 (*)    Potassium 3.0 (*)    Chloride 93 (*)    Glucose, Bld 152 (*)    Calcium , Ion 0.99 (*)    All other components within normal limits  I-STAT CG4 LACTIC ACID, ED - Abnormal; Notable for the following components:   Lactic Acid, Venous 3.3 (*)    All other components within normal limits  I-STAT CG4 LACTIC ACID, ED -  Abnormal; Notable for the following components:   Lactic Acid, Venous 4.1 (*)    All other components within normal limits  I-STAT CG4 LACTIC ACID, ED - Abnormal; Notable for the following components:   Lactic Acid, Venous 4.6 (*)    All other components within normal limits  I-STAT CG4 LACTIC ACID, ED - Abnormal; Notable for the following components:   Lactic Acid, Venous 3.6 (*)    All other components within normal limits  CBC  PROTIME-INR  CK  SAMPLE TO BLOOD BANK    CT CHEST ABDOMEN PELVIS W CONTRAST  Final Result    DG Femur Min 2 Views Right  Final Result    CT HEAD WO CONTRAST  Final Result    CT CERVICAL SPINE WO CONTRAST  Final Result    DG Chest Port 1 View  Final Result     DG Pelvis Portable  Final Result      Medications  metoprolol  tartrate (LOPRESSOR ) tablet 50 mg (50 mg Oral Given 08/07/24 2337)  prochlorperazine  (COMPAZINE ) injection 5 mg (0 mg Intravenous Hold 08/08/24 0315)  Tdap (BOOSTRIX ) injection 0.5 mL (0.5 mLs Intramuscular Given 08/07/24 2237)  lidocaine -EPINEPHrine  (XYLOCAINE  W/EPI) 2 %-1:200000 (PF) injection 20 mL ( Infiltration Given 08/07/24 2337)  lactated ringers  bolus 1,000 mL (0 mLs Intravenous Stopped 08/07/24 2355)  potassium chloride  SA (KLOR-CON  M) CR tablet 40 mEq (40 mEq Oral Given 08/07/24 2337)  lactated ringers  bolus 1,000 mL (0 mLs Intravenous Stopped 08/08/24 0205)  alum & mag hydroxide-simeth (MAALOX/MYLANTA) 200-200-20 MG/5ML suspension 15 mL (15 mLs Oral Given 08/08/24 0203)  lactated ringers  bolus 1,000 mL (0 mLs Intravenous Stopped 08/08/24 0436)  iohexol  (OMNIPAQUE ) 350 MG/ML injection 75 mL (75 mLs Intravenous Contrast Given 08/08/24 0310)     Procedures  /  Critical Care .Critical Care  Performed by: Vicky Charleston, PA-C Authorized by: Vicky Charleston, PA-C   Critical care provider statement:    Critical care time (minutes):  35   Critical care was necessary to treat or prevent imminent or life-threatening deterioration of the following conditions:  Trauma   Critical care was time spent personally by me on the following activities:  Development of treatment plan with patient or surrogate, discussions with consultants, evaluation of patient's response to treatment, examination of patient, ordering and review of laboratory studies, ordering and review of radiographic studies, ordering and performing treatments and interventions, pulse oximetry, re-evaluation of patient's condition and review of old charts .Laceration Repair  Date/Time: 08/08/2024 12:33 AM  Performed by: Vicky Charleston, PA-C Authorized by: Vicky Charleston, PA-C   Consent:    Consent obtained:  Verbal   Consent given by:  Patient   Risks, benefits,  and alternatives were discussed: yes     Risks discussed:  Infection, pain, poor cosmetic result, poor wound healing and need for additional repair   Alternatives discussed:  No treatment Universal protocol:    Procedure explained and questions answered to patient or proxy's satisfaction: yes     Relevant documents present and verified: yes     Test results available: yes     Imaging studies available: yes     Required blood products, implants, devices, and special equipment available: yes     Site/side marked: yes     Immediately prior to procedure, a time out was called: yes     Patient identity confirmed:  Verbally with patient Anesthesia:    Anesthesia method:  Local infiltration   Local anesthetic:  Lidocaine  1% WITH epi Laceration  details:    Location:  Scalp   Scalp location:  Mid-scalp   Length (cm):  4 Exploration:    Hemostasis achieved with:  Direct pressure and tied off vessels   Imaging outcome: foreign body not noted     Contaminated: no   Treatment:    Area cleansed with:  Saline   Amount of cleaning:  Extensive   Irrigation solution:  Sterile saline Skin repair:    Repair method:  Sutures and staples   Suture size:  4-0   Wound skin closure material used: vicryl.   Suture technique:  Figure eight   Number of sutures:  2   Number of staples:  8 Approximation:    Approximation:  Close Repair type:    Repair type:  Simple Post-procedure details:    Dressing:  Bulky dressing   Procedure completion:  Tolerated well, no immediate complications   ED Course and Medical Decision Making  I have reviewed the triage vital signs, the nursing notes, and pertinent available records from the EMR.  Social Determinants Affecting Complexity of Care: Patient has no clinically significant social determinants affecting this chief complaint..   ED Course: Clinical Course as of 08/08/24 0445  Tue Aug 08, 2024  0032 I-Stat Lactic Acid, ED(!!) Likely reactive from the  trauma, doubt sepsis.   [RB]    Clinical Course User Index [RB] Vicky Charleston, PA-C    Medical Decision Making Patient here after a mechanical fall.  She states that she fell over while getting dressed.  She states that she had been drinking alcohol tonight.  Ethanol is slightly elevated.  Patient initially brought in as a level 2 trauma, for fall on thinners.  ATLS protocol followed. ABCs intact.  There was some persistent oozing from her head laceration, I placed 2 figure-of-eight sutures prior to going to CT after having achieved hemostasis.  Patient's workup largely reassuring.  Initial imaging negative.  Patient ambulates, and complaints of some right leg pain when doing so.  She was able to ambulate with her cane without assistance.  Due to her still having some pain, will check femur x-ray.  This resulted without obvious traumatic injury.  Lactic was initially slightly elevated, on recheck it had up trended.  Suspect this is secondary to dehydration, alcohol, but is not thought due to sepsis or infection.  However, given her fall, and being on blood thinners, will check CT chest/abdomen/pelvis.  CT shows no acute traumatic findings within the chest/abdomen/pelvis.  After receiving 2 and half liters of fluid, lactic is downtrending and is now 3.3.  We discussed all return precautions.  Patient appears stable and sober for discharge.  Amount and/or Complexity of Data Reviewed Labs: ordered. Decision-making details documented in ED Course. Radiology: ordered. ECG/medicine tests: ordered.  Risk OTC drugs. Prescription drug management.         Consultants: No consultations were needed in caring for this patient.   Treatment and Plan: I considered admission due to patient's initial presentation, but after considering the examination and diagnostic results, patient will not require admission and can be discharged with outpatient follow-up.  Patient seen by and  discussed with Dr. Gennaro, who agrees with workup and initial plan.  Patient discussed with Dr. Theadore, lactic thought elevated due to some dehydration from drinking.  Down trending now after fluid.  No infectious symptoms.  Not thought to be septic.  No leukocytosis or fever.  No evidence of infection on exam.  Repeat HGB is a little  lower than the first, but still in normal range.  Patient not hypotensive nor tachycardic.  No evidence of internal bleeding on CTs.    Patient appears stable for discharge home with PCP follow-up in 10 days for staple removal.  Final Clinical Impressions(s) / ED Diagnoses     ICD-10-CM   1. Fall, initial encounter  W19.XXXA     2. Injury of head, initial encounter  S09.90XA     3. Laceration of scalp, initial encounter  S01.01XA     4. Right hip pain  M25.551       ED Discharge Orders     None         Discharge Instructions Discussed with and Provided to Patient:     Discharge Instructions      Your scans and imaging looked good.    Your potassium was low at 2.9, please resume taking your potassium.  We gave you a dose tonight.  Your staples need to be removed in 10 days.  The sutures will dissolve on their own.  Watch for signs and symptoms of possible delayed intracranial hemorrhage. The following symptoms warrant immediate return to the emergency department:  New or worsening headache  Nausea or vomiting  Confusion, drowsiness, or difficulty waking  Slurred speech or difficulty understanding speech  Weakness, numbness, or clumsiness in arms or legs  Loss of balance or new difficulty walking  Seizure activity  Any loss of consciousness  Visual changes If any of these symptoms develop, prompt evaluation is required due to the small but real risk of delayed intracranial bleeding.       Vicky Charleston, PA-C 08/08/24 0451    Kammerer, Megan L, DO 08/08/24 [phone]

## 2024-08-07 NOTE — Discharge Instructions (Signed)
 Your scans and imaging looked good.    Your potassium was low at 2.9, please resume taking your potassium.  We gave you a dose tonight.  Your staples need to be removed in 10 days.  The sutures will dissolve on their own.  Watch for signs and symptoms of possible delayed intracranial hemorrhage. The following symptoms warrant immediate return to the emergency department:  New or worsening headache  Nausea or vomiting  Confusion, drowsiness, or difficulty waking  Slurred speech or difficulty understanding speech  Weakness, numbness, or clumsiness in arms or legs  Loss of balance or new difficulty walking  Seizure activity  Any loss of consciousness  Visual changes If any of these symptoms develop, prompt evaluation is required due to the small but real risk of delayed intracranial bleeding.

## 2024-08-08 ENCOUNTER — Emergency Department (HOSPITAL_COMMUNITY)

## 2024-08-08 DIAGNOSIS — S72141D Displaced intertrochanteric fracture of right femur, subsequent encounter for closed fracture with routine healing: Secondary | ICD-10-CM | POA: Diagnosis not present

## 2024-08-08 DIAGNOSIS — K760 Fatty (change of) liver, not elsewhere classified: Secondary | ICD-10-CM | POA: Diagnosis not present

## 2024-08-08 DIAGNOSIS — S0101XA Laceration without foreign body of scalp, initial encounter: Secondary | ICD-10-CM | POA: Diagnosis not present

## 2024-08-08 DIAGNOSIS — M1711 Unilateral primary osteoarthritis, right knee: Secondary | ICD-10-CM | POA: Diagnosis not present

## 2024-08-08 DIAGNOSIS — I700 Atherosclerosis of aorta: Secondary | ICD-10-CM | POA: Diagnosis not present

## 2024-08-08 LAB — CK: Total CK: 83 U/L (ref 38–234)

## 2024-08-08 LAB — I-STAT CG4 LACTIC ACID, ED
Lactic Acid, Venous: 4.1 mmol/L (ref 0.5–1.9)
Lactic Acid, Venous: 4.6 mmol/L (ref 0.5–1.9)
Lactic Acid, Venous: 3.6 mmol/L (ref 0.5–1.9)

## 2024-08-08 LAB — URINALYSIS, ROUTINE W REFLEX MICROSCOPIC
Bilirubin Urine: NEGATIVE
Glucose, UA: NEGATIVE mg/dL
Ketones, ur: NEGATIVE mg/dL
Leukocytes,Ua: NEGATIVE
Nitrite: NEGATIVE
Protein, ur: NEGATIVE mg/dL
Specific Gravity, Urine: 1.004 — ABNORMAL LOW (ref 1.005–1.030)
pH: 6 (ref 5.0–8.0)

## 2024-08-08 LAB — CBC
HCT: 37 % (ref 36.0–46.0)
Hemoglobin: 12.6 g/dL (ref 12.0–15.0)
MCH: 30.3 pg (ref 26.0–34.0)
MCHC: 34.1 g/dL (ref 30.0–36.0)
MCV: 88.9 fL (ref 80.0–100.0)
Platelets: 132 K/uL — ABNORMAL LOW (ref 150–400)
RBC: 4.16 MIL/uL (ref 3.87–5.11)
RDW: 12.4 % (ref 11.5–15.5)
WBC: 8.3 K/uL (ref 4.0–10.5)
nRBC: 0 % (ref 0.0–0.2)

## 2024-08-08 MED ORDER — PROCHLORPERAZINE EDISYLATE 10 MG/2ML IJ SOLN
5.0000 mg | Freq: Once | INTRAMUSCULAR | Status: DC
  Filled 2024-08-08: qty 2

## 2024-08-08 MED ORDER — LACTATED RINGERS IV BOLUS
1000.0000 mL | Freq: Once | INTRAVENOUS | Status: AC
  Administered 2024-08-08: 1000 mL via INTRAVENOUS

## 2024-08-08 MED ORDER — IOHEXOL 350 MG/ML SOLN
75.0000 mL | Freq: Once | INTRAVENOUS | Status: AC | PRN
  Administered 2024-08-08: 75 mL via INTRAVENOUS

## 2024-08-08 MED ORDER — ALUM & MAG HYDROXIDE-SIMETH 200-200-20 MG/5ML PO SUSP
15.0000 mL | Freq: Once | ORAL | Status: AC
  Administered 2024-08-08: 15 mL via ORAL
  Filled 2024-08-08: qty 30

## 2024-08-08 NOTE — ED Notes (Signed)
 Pt taken to CT.

## 2024-08-08 NOTE — ED Notes (Signed)
 Having indigestion currently. Provider made aware

## 2024-08-08 NOTE — ED Notes (Addendum)
 Trauma Response Nurse Documentation   Carrie Chung is a 83 y.o. female arriving to [facility] ED via EMS  On clopidogrel  75 mg daily. Trauma was activated as a Level 2 by ED charge RN based on the following trauma criteria Elderly patients > 65 with head trauma on anti-coagulation (excluding ASA). GLF in bathroom Patient cleared for CT by Myer Schlossman PA-C. Pt transported to CT with trauma response nurse present to monitor. RN remained with the patient throughout their absence from the department for clinical observation.   GCS 15.   History   Past Medical History:  Diagnosis Date   Atrial tachycardia (HCC)    a. noted at cardiac rehab 5/13; event monitor ordered to assess for AFib   CAD (coronary artery disease)    a. NSTEMI 03/2012 (LHC 03/27/12: pLAD 30%, mLAD 99%, mCFX 95%, mRCA 99% with thrombus, EF 65%);  s/p PTCA/DESx1 to prox-mid RCA 03/27/12 urgently in setting of hypotension/bradycardia, with staged PTCA/Evolve study stent to mid LAD & PTCA/Evolve study stent to prox LCx 03/29/12 ;   echo 03/28/12:EF 60%, Aortic sclerosis without AS, mild RVE, mild reduced RVSF     Endometrial polyp    HTN (hypertension)    Hyperlipidemia    MI, old    Sleep related hypoxia      Past Surgical History:  Procedure Laterality Date   APPENDECTOMY  83 years old   CAROTID STENT  04/2012   x3   CESAREAN SECTION  1984   CHOLECYSTECTOMY  1978   FEMUR IM NAIL Right 09/21/2023   Procedure: INTRAMEDULLARY (IM) NAIL FEMORAL;  Surgeon: Beverley Evalene BIRCH, MD;  Location: [facility];  Service: Orthopedics;  Laterality: Right;   INTRAMEDULLARY (IM) NAIL INTERTROCHANTERIC Left 06/20/2021   Procedure: INTRAMEDULLARY (IM) NAIL INTERTROCHANTRIC;  Surgeon: Beverley Evalene BIRCH, MD;  Location: [facility] OR;  Service: Orthopedics;  Laterality: Left;   LEFT HEART CATHETERIZATION WITH CORONARY ANGIOGRAM N/A 03/27/2012   Procedure: LEFT HEART CATHETERIZATION WITH CORONARY ANGIOGRAM;  Surgeon: Lonni BIRCH Cash, MD;  Location: [facility]  CATH LAB;  Service: Cardiovascular;  Laterality: N/A;   PERCUTANEOUS CORONARY STENT INTERVENTION (PCI-S) N/A 03/27/2012   Procedure: PERCUTANEOUS CORONARY STENT INTERVENTION (PCI-S);  Surgeon: Lonni BIRCH Cash, MD;  Location: [facility] CATH LAB;  Service: Cardiovascular;  Laterality: N/A;   PERCUTANEOUS CORONARY STENT INTERVENTION (PCI-S) N/A 03/29/2012   Procedure: PERCUTANEOUS CORONARY STENT INTERVENTION (PCI-S);  Surgeon: Ozell Fell, MD;  Location: [facility] CATH LAB;  Service: Cardiovascular;  Laterality: N/A;   TONSILLECTOMY  83 years old       Initial Focused Assessment (If applicable, or please see trauma documentation): Alert/oriented female presents via EMS from home after a fall in the bathroom, states she was trying to get her underwear on and fell. Lac to the top of her head, bleeding uncontrolled by EMS guaze dressing. Figure 8 stitch x2 placed during primary assessment by EDP with hemorrhage control. Abrasion to left posterior head.  Airway patent, BS clear Bleeding to head lac uncontrolled on arrival, improved after stitches placed GCS 15 PERRLA 4  CT's Completed:   CT Head and CT C-Spine   Interventions:  IV start and trauma lab draw Portable chest and pelvis, right femur, right hip XRAY CT head and c-spine Wound care, lac repair by EDP with staplesx8, figure 8 stitchesx2 TDAP LR fluid bolus  Plan for disposition:  Discharge home   Consults completed:  none  Event Summary: Pt presents via EMS from home after a ground level mechanical fall  in her bathroom. ETOH on board. States she was trying to put on her underwear and lost her balance. No LOC, recalls events of tonight. Lac to the top of her head with bleeding uncontrolled, figure 8 suturesx2 placed by EDP during primary assessment for hemorrhage control. Pt has hematoma to the back of her head on the left side. No other injuries noted. Portable XRAYS completed, then escorted to CT. Trauma scans unremarkable. Wound cleaned  and additional staples x8 placed by EDP. Pt assisted to bathroom(turn and pivot to wheelchair) and reported more pain in her right hip/leg with ambulation. Additional imaging ordered - daughter at bedside for this discussion with EDP.   Bedside handoff with ED RN [place].    Nalini Alcaraz O Sona Nations  Trauma Response RN  Please call TRN at [phone] for further assistance.

## 2024-08-08 NOTE — ED Notes (Signed)
 Enroute to the bathroom with NT, pt became nauseous and vomited.

## 2024-08-08 NOTE — ED Notes (Signed)
 Bleeding noted throught bandage to top of head; bandage removed and clot was around t sutures. No visible bleeding

## 2024-08-17 ENCOUNTER — Ambulatory Visit: Admitting: Internal Medicine

## 2024-08-17 ENCOUNTER — Encounter: Payer: Self-pay | Admitting: Internal Medicine

## 2024-08-17 VITALS — BP 110/80 | HR 71 | Temp 98.6°F | Ht 60.0 in | Wt 148.0 lb

## 2024-08-17 DIAGNOSIS — S0990XD Unspecified injury of head, subsequent encounter: Secondary | ICD-10-CM | POA: Diagnosis not present

## 2024-08-17 DIAGNOSIS — S0101XD Laceration without foreign body of scalp, subsequent encounter: Secondary | ICD-10-CM | POA: Diagnosis not present

## 2024-08-17 NOTE — Progress Notes (Signed)
 Patient Care Team: Perri Ronal PARAS, MD as PCP - General (Internal Medicine) Wonda Sharper, MD as PCP - Cardiology (Cardiology) Perri Ronal PARAS, MD (Internal Medicine)  Visit Date: 08/17/24  Subjective:   Chief Complaint  Patient presents with   Suture / Staple Removal        Vitals:   08/17/24 1127  BP: 110/80   Patient PI:Ipjwwj C Cregan,Female DOB:03/30/1941,82 y.o. MRN:[id number]   82 y.o.Female presents today for ED follow-up from fall on 08/07/24. Patient has a past medical history of hypertenstion, CAD, NSTEMI, Hypoxic respiratory failure, Diabetes.  She was in seen in the [place] ED on 08/08/24 for a fall while she was getting dressed resulting in a 4 cm mid-scalp laceration, which was treated with 2 4-0 vicryl sutures and staples. She had been drinking and had an ethanol level of 136.    She has seven staples removed on 08/17/2024 and the eighth staple could not be found.  Past Medical History:  Diagnosis Date   Atrial tachycardia (HCC)    a. noted at cardiac rehab 5/13; event monitor ordered to assess for AFib   CAD (coronary artery disease)    a. NSTEMI 03/2012 (LHC 03/27/12: pLAD 30%, mLAD 99%, mCFX 95%, mRCA 99% with thrombus, EF 65%);  s/p PTCA/DESx1 to prox-mid RCA 03/27/12 urgently in setting of hypotension/bradycardia, with staged PTCA/Evolve study stent to mid LAD & PTCA/Evolve study stent to prox LCx 03/29/12 ;   echo 03/28/12:EF 60%, Aortic sclerosis without AS, mild RVE, mild reduced RVSF     Endometrial polyp    HTN (hypertension)    Hyperlipidemia    MI, old    Sleep related hypoxia     Allergies  Allergen Reactions   Penicillins Hives, Swelling and Other (See Comments)   Sulfonamide Derivatives Hives and Swelling   Lactose Intolerance (Gi) Diarrhea   Sulfamethoxazole Swelling   Immunization History  Administered Date(s) Administered   INFLUENZA, HIGH DOSE SEASONAL PF 10/23/2016, 08/26/2017, 09/08/2018, 08/30/2019   Influenza Split 09/09/2013    Influenza,inj,Quad PF,6+ Mos 09/16/2015   Influenza-Unspecified 09/20/2014, 08/26/2017, 08/21/2018   Moderna Sars-Covid-2 Vaccination 01/04/2020, 02/01/2020, 10/14/2020   Pneumococcal Conjugate-13 07/16/2015   Pneumococcal Polysaccharide-23 03/28/2012   Tdap 04/28/2012, 08/07/2024   Past Surgical History:  Procedure Laterality Date   APPENDECTOMY  83 years old   CAROTID STENT  04/2012   x3   CESAREAN SECTION  1984   CHOLECYSTECTOMY  1978   FEMUR IM NAIL Right 09/21/2023   Procedure: INTRAMEDULLARY (IM) NAIL FEMORAL;  Surgeon: Beverley Evalene BIRCH, MD;  Location: [facility];  Service: Orthopedics;  Laterality: Right;   INTRAMEDULLARY (IM) NAIL INTERTROCHANTERIC Left 06/20/2021   Procedure: INTRAMEDULLARY (IM) NAIL INTERTROCHANTRIC;  Surgeon: Beverley Evalene BIRCH, MD;  Location: [facility];  Service: Orthopedics;  Laterality: Left;   LEFT HEART CATHETERIZATION WITH CORONARY ANGIOGRAM N/A 03/27/2012   Procedure: LEFT HEART CATHETERIZATION WITH CORONARY ANGIOGRAM;  Surgeon: Lonni BIRCH Cash, MD;  Location: [facility] CATH LAB;  Service: Cardiovascular;  Laterality: N/A;   PERCUTANEOUS CORONARY STENT INTERVENTION (PCI-S) N/A 03/27/2012   Procedure: PERCUTANEOUS CORONARY STENT INTERVENTION (PCI-S);  Surgeon: Lonni BIRCH Cash, MD;  Location: [facility] CATH LAB;  Service: Cardiovascular;  Laterality: N/A;   PERCUTANEOUS CORONARY STENT INTERVENTION (PCI-S) N/A 03/29/2012   Procedure: PERCUTANEOUS CORONARY STENT INTERVENTION (PCI-S);  Surgeon: Sharper Wonda, MD;  Location: [facility] CATH LAB;  Service: Cardiovascular;  Laterality: N/A;   TONSILLECTOMY  83 years old    Family History  Problem Relation Age of Onset  Coronary artery disease Mother        CABG at age 40, PPM   Colon cancer Father    Cancer - Other Father        larnyx   Social History   Social History Narrative   Caffeine 2 cups tea.   Review of Systems  Constitutional:  Negative for fever and malaise/fatigue.  HENT:  Negative for congestion.   Eyes:   Negative for blurred vision.  Respiratory:  Negative for cough and shortness of breath.   Cardiovascular:  Negative for chest pain, palpitations and leg swelling.  Gastrointestinal:  Negative for vomiting.  Musculoskeletal:  Positive for falls (w/ subsequential mid-scalp laceration). Negative for back pain.  Skin:  Negative for rash.  Neurological:  Negative for loss of consciousness and headaches.     Objective:  Vitals: BP 110/80   Pulse 71   Temp 98.6 F (37 C)   Ht 5' (1.524 m)   Wt 148 lb (67.1 kg)   SpO2 97%   BMI 28.90 kg/m   Physical Exam Vitals and nursing note reviewed.  Constitutional:      General: She is not in acute distress.    Appearance: Normal appearance. She is not toxic-appearing.  HENT:     Head: Normocephalic and atraumatic.  Pulmonary:     Effort: Pulmonary effort is normal.  Skin:    General: Skin is warm and dry.  Neurological:     Mental Status: She is alert and oriented to person, place, and time. Mental status is at baseline.  Psychiatric:        Mood and Affect: Mood normal.        Behavior: Behavior normal.        Thought Content: Thought content normal.        Judgment: Judgment normal.     Results:  Studies Obtained And Personally Reviewed By Me: Labs:  CBC w/ Differential Lab Results  Component Value Date   WBC 8.3 08/08/2024   RBC 4.16 08/08/2024   HGB 12.6 08/08/2024   HCT 37.0 08/08/2024   PLT 132 (L) 08/08/2024   MCV 88.9 08/08/2024   MCH 30.3 08/08/2024   MCHC 34.1 08/08/2024   RDW 12.4 08/08/2024   MPV 10.4 07/18/2024   LYMPHSABS 1.1 10/16/2023   MONOABS 0.4 10/16/2023   BASOSABS 32 07/18/2024    Comprehensive Metabolic Panel Lab Results  Component Value Date   NA 134 (L) 08/07/2024   K 3.0 (L) 08/07/2024   CL 93 (L) 08/07/2024   CO2 26 08/07/2024   GLUCOSE 152 (H) 08/07/2024   BUN 9 08/07/2024   CREATININE 0.90 08/07/2024   CALCIUM  9.1 08/07/2024   PROT 6.4 (L) 08/07/2024   ALBUMIN 3.6 08/07/2024   AST 30  08/07/2024   ALT 18 08/07/2024   ALKPHOS 72 08/07/2024   BILITOT 0.4 08/07/2024   GFR 73.30 06/21/2015   EGFR 76 08/30/2023   GFRNONAA >60 08/07/2024   Lipid Panel  Lab Results  Component Value Date   CHOL 155 02/29/2024   HDL 63 02/29/2024   LDLCALC 68 02/29/2024   TRIG 162 (H) 02/29/2024   A1c Lab Results  Component Value Date   HGBA1C 6.6 (H) 02/29/2024    TSH Lab Results  Component Value Date   TSH 2.56 07/18/2024   No results found for any visits on 08/17/24. Assessment & Plan:   Scalp laceration; Traumatic injury of head, subsequent encounter: She was in seen in the  [place] [facility]  ED for a fall while she was getting dressed resulting in a 4 cm mid-scalp laceration, which was treated with 2 4-0 vicryl sutures and staples. She had been drinking and had an ethanol level of 136.  She has seven staples removed on 08/17/2024 and the eighth staple could not be found.    I,Emily Lagle,acting as a Neurosurgeon for Ronal JINNY Hailstone, MD.,have documented all relevant documentation on the behalf of Ronal JINNY Hailstone, MD,as directed by  Ronal JINNY Hailstone, MD while in the presence of Ronal JINNY Hailstone, MD.  I, Ronal JINNY Hailstone, MD, have reviewed all documentation for this visit. The documentation on 08/17/2024 for the exam, diagnosis, procedures, and orders are all accurate and complete.

## 2024-08-17 NOTE — Progress Notes (Signed)
   Subjective:    Patient ID: Carrie Chung, female    DOB: 04-15-1941, 83 y.o.   MRN: [id number]  HPI 83 year old Female seen today in follow-up of a fall on August 18 which occurred at her home while she was sitting on the toilet trying to take her underwear off.  She sustained a laceration to the top of her scalp requiring 8 staples to repair.  She was seen at Emergency department at [facility].  Had no evidence of concussion and was discharged home.  Has done well at home    Review of Systems no headache nausea or vomiting.     Objective:   Physical Exam   VS reviewed. She is alert and cooperative.  There is a lot of dried blood about the staple sites.  7 staples were removed without difficulty.  The eighth staple cannot be found at the present time and I suspect lies underneath some of the dried blood.  She is alert and oriented.  Affect is normal. No gross focal deficits on brief neurological exam.       Assessment & Plan:   Laceration to scalp requiring repair with 8 staples.  7 staples were removed today without difficulty.  Suspect there is 1 remaining staple.  We need for the laceration to heal a bit more and for the  dried blood to be removed from her scalp with gentle cleansing before we can find that remaining metallic staple.  She may return in the next few days when staple becomes visible after she is able to clean her scalp thoroughly and dried blood has been removed with gentle shampooing.  She has no evidence of concussion and is alert and oriented at this time.

## 2024-08-17 NOTE — Patient Instructions (Signed)
 The laceration needs to heal a bit more and the dried blood needs to come off naturally before we can find the remaining stable.  She will shampoo her  scalp lightly.  Once the lesion is better healed and she finds the eighth staple we can remove it in the office easily.  We are told there are 8 staples per emergency department report.  We removed 7 today.

## 2024-08-21 ENCOUNTER — Other Ambulatory Visit: Payer: Self-pay | Admitting: Cardiovascular Disease

## 2024-08-21 DIAGNOSIS — E782 Mixed hyperlipidemia: Secondary | ICD-10-CM

## 2024-08-21 DIAGNOSIS — I25119 Atherosclerotic heart disease of native coronary artery with unspecified angina pectoris: Secondary | ICD-10-CM

## 2024-08-21 DIAGNOSIS — I10 Essential (primary) hypertension: Secondary | ICD-10-CM

## 2024-08-22 ENCOUNTER — Ambulatory Visit: Admitting: Internal Medicine

## 2024-08-22 VITALS — BP 130/80 | HR 70 | Temp 98.5°F | Ht 60.0 in | Wt 148.0 lb

## 2024-08-22 DIAGNOSIS — E119 Type 2 diabetes mellitus without complications: Secondary | ICD-10-CM

## 2024-08-22 DIAGNOSIS — Z7901 Long term (current) use of anticoagulants: Secondary | ICD-10-CM

## 2024-08-22 DIAGNOSIS — S0101XD Laceration without foreign body of scalp, subsequent encounter: Secondary | ICD-10-CM | POA: Diagnosis not present

## 2024-08-22 NOTE — Progress Notes (Signed)
 Patient Care Team: Perri Ronal PARAS, MD as PCP - General (Internal Medicine) Wonda Sharper, MD as PCP - Cardiology (Cardiology) Perri Ronal PARAS, MD (Internal Medicine)  Visit Date: 08/22/24  Subjective:   Chief Complaint  Patient presents with   Suture / Staple Removal   Patient PI:Carrie Chung,Female DOB:July 04, 1941,82 y.o. MRN:[id number]   82 y.o.Female presents today for follow-up on Staple Removal. Patient has a past medical history of Anticoagulation Therapy on Plavix  75 mg daily.  Initially seen 8/28 following a visit to the ED for a fall w/ subsequent scalp laceration. At that visit only 7 staples were able to be removed as there was too much dried blood still to effectively visualize. Thus, she returns today to have the one remaining staple removed. She does note having had difficulty sleeping due to persisting scalp tenderness, but has otherwise been fine. Past Medical History:  Diagnosis Date   Atrial tachycardia (HCC)    a. noted at cardiac rehab 5/13; event monitor ordered to assess for AFib   CAD (coronary artery disease)    a. NSTEMI 03/2012 (LHC 03/27/12: pLAD 30%, mLAD 99%, mCFX 95%, mRCA 99% with thrombus, EF 65%);  s/p PTCA/DESx1 to prox-mid RCA 03/27/12 urgently in setting of hypotension/bradycardia, with staged PTCA/Evolve study stent to mid LAD & PTCA/Evolve study stent to prox LCx 03/29/12 ;   echo 03/28/12:EF 60%, Aortic sclerosis without AS, mild RVE, mild reduced RVSF     Endometrial polyp    HTN (hypertension)    Hyperlipidemia    MI, old    Sleep related hypoxia     Allergies  Allergen Reactions   Penicillins Hives, Swelling and Other (See Comments)   Sulfonamide Derivatives Hives and Swelling   Lactose Intolerance (Gi) Diarrhea   Sulfamethoxazole Swelling   Immunization History  Administered Date(s) Administered   INFLUENZA, HIGH DOSE SEASONAL PF 10/23/2016, 08/26/2017, 09/08/2018, 08/30/2019   Influenza Split 09/09/2013   Influenza,inj,Quad PF,6+ Mos  09/16/2015   Influenza-Unspecified 09/20/2014, 08/26/2017, 08/21/2018   Moderna Sars-Covid-2 Vaccination 01/04/2020, 02/01/2020, 10/14/2020   Pneumococcal Conjugate-13 07/16/2015   Pneumococcal Polysaccharide-23 03/28/2012   Tdap 04/28/2012, 08/07/2024   Past Surgical History:  Procedure Laterality Date   APPENDECTOMY  83 years old   CAROTID STENT  04/2012   x3   CESAREAN SECTION  1984   CHOLECYSTECTOMY  1978   FEMUR IM NAIL Right 09/21/2023   Procedure: INTRAMEDULLARY (IM) NAIL FEMORAL;  Surgeon: Beverley Evalene BIRCH, MD;  Location: [facility];  Service: Orthopedics;  Laterality: Right;   INTRAMEDULLARY (IM) NAIL INTERTROCHANTERIC Left 06/20/2021   Procedure: INTRAMEDULLARY (IM) NAIL INTERTROCHANTRIC;  Surgeon: Beverley Evalene BIRCH, MD;  Location: [facility] OR;  Service: Orthopedics;  Laterality: Left;   LEFT HEART CATHETERIZATION WITH CORONARY ANGIOGRAM N/A 03/27/2012   Procedure: LEFT HEART CATHETERIZATION WITH CORONARY ANGIOGRAM;  Surgeon: Lonni BIRCH Cash, MD;  Location: [facility] CATH LAB;  Service: Cardiovascular;  Laterality: N/A;   PERCUTANEOUS CORONARY STENT INTERVENTION (PCI-S) N/A 03/27/2012   Procedure: PERCUTANEOUS CORONARY STENT INTERVENTION (PCI-S);  Surgeon: Lonni BIRCH Cash, MD;  Location: [facility] CATH LAB;  Service: Cardiovascular;  Laterality: N/A;   PERCUTANEOUS CORONARY STENT INTERVENTION (PCI-S) N/A 03/29/2012   Procedure: PERCUTANEOUS CORONARY STENT INTERVENTION (PCI-S);  Surgeon: Sharper Wonda, MD;  Location: [facility] CATH LAB;  Service: Cardiovascular;  Laterality: N/A;   TONSILLECTOMY  83 years old    Family History  Problem Relation Age of Onset   Coronary artery disease Mother        CABG at age 66,  PPM   Colon cancer Father    Cancer - Other Father        larnyx   Social History   Social History Narrative   Caffeine 2 cups tea.   Review of Systems  Constitutional:  Negative for fever and malaise/fatigue.  HENT:  Negative for congestion.   Eyes:  Negative for blurred vision.   Respiratory:  Negative for cough and shortness of breath.   Cardiovascular:  Negative for chest pain, palpitations and leg swelling.  Gastrointestinal:  Negative for vomiting.  Musculoskeletal:  Negative for back pain.  Skin:  Negative for rash.       (+) Scalp Tenderness  Neurological:  Negative for loss of consciousness and headaches.     Objective:  Vitals: BP 130/80   Pulse 70   Temp 98.5 F (36.9 C)   Ht 5' (1.524 m)   Wt 148 lb (67.1 kg)   SpO2 96%   BMI 28.90 kg/m   Physical Exam Vitals and nursing note reviewed.  Constitutional:      General: She is not in acute distress.    Appearance: Normal appearance. She is not toxic-appearing.  HENT:     Head: Normocephalic and atraumatic.  Pulmonary:     Effort: Pulmonary effort is normal.  Skin:    General: Skin is warm and dry.  Neurological:     Mental Status: She is alert and oriented to person, place, and time. Mental status is at baseline.  Psychiatric:        Mood and Affect: Mood normal.        Behavior: Behavior normal.        Thought Content: Thought content normal.        Judgment: Judgment normal.     Results:  Studies Obtained And Personally Reviewed By Me: Labs:  CBC w/ Differential Lab Results  Component Value Date   WBC 8.3 08/08/2024   RBC 4.16 08/08/2024   HGB 12.6 08/08/2024   HCT 37.0 08/08/2024   PLT 132 (L) 08/08/2024   MCV 88.9 08/08/2024   MCH 30.3 08/08/2024   MCHC 34.1 08/08/2024   RDW 12.4 08/08/2024   MPV 10.4 07/18/2024   LYMPHSABS 1.1 10/16/2023   MONOABS 0.4 10/16/2023   BASOSABS 32 07/18/2024    Comprehensive Metabolic Panel Lab Results  Component Value Date   NA 134 (L) 08/07/2024   K 3.0 (L) 08/07/2024   CL 93 (L) 08/07/2024   CO2 26 08/07/2024   GLUCOSE 152 (H) 08/07/2024   BUN 9 08/07/2024   CREATININE 0.90 08/07/2024   CALCIUM  9.1 08/07/2024   PROT 6.4 (L) 08/07/2024   ALBUMIN 3.6 08/07/2024   AST 30 08/07/2024   ALT 18 08/07/2024   ALKPHOS 72 08/07/2024    BILITOT 0.4 08/07/2024   GFR 73.30 06/21/2015   EGFR 76 08/30/2023   GFRNONAA >60 08/07/2024   Lipid Panel  Lab Results  Component Value Date   CHOL 155 02/29/2024   HDL 63 02/29/2024   LDLCALC 68 02/29/2024   TRIG 162 (H) 02/29/2024   A1c Lab Results  Component Value Date   HGBA1C 6.6 (H) 02/29/2024    TSH Lab Results  Component Value Date   TSH 2.56 07/18/2024   Assessment & Plan:   Orders Placed This Encounter  Procedures   Microalbumin / creatinine urine ratio  Follow up scalp laceration- has no evidence of concussion and is alert. Has healing scalp wound- slowly resolving with absorbable sutures in place as  well as one remaining stable which was removed today. Staple Removal: initially seen 8/28 following a visit to the ED for a fall w/ subsequent scalp laceration. At that visit only 7 staples were able to be removed as there was too much dried blood still to effectively visualize. Thus, she returns today to have the one remaining staple removed and for follow up on head trauma. She has mild memory loss that was present before head trauma and does not seem to be worse. She does note having had difficulty sleeping due to persisting scalp tenderness, but has otherwise been fine.  Final staple removed. Patient tolerated procedure. Counseled on gentle hair care routine until wound heals completely. Remainder of sutures in place are reportedly absorbable per patient.  Anticoagulation Therapy on Plavix  75 mg daily.    I,Emily Lagle,acting as a Neurosurgeon for Ronal JINNY Hailstone, MD.,have documented all relevant documentation on the behalf of Ronal JINNY Hailstone, MD,as directed by  Ronal JINNY Hailstone, MD while in the presence of Ronal JINNY Hailstone, MD.  I, Ronal JINNY Hailstone, MD, have reviewed all documentation for this visit. The documentation on 08/22/2024 for the exam, diagnosis, procedures, and orders are all accurate and complete.

## 2024-08-23 LAB — MICROALBUMIN / CREATININE URINE RATIO
Creatinine, Urine: 94 mg/dL (ref 20–275)
Microalb Creat Ratio: 15 mg/g{creat} (ref <30)
Microalb, Ur: 1.4 mg/dL

## 2024-08-23 MED ORDER — METOPROLOL TARTRATE 50 MG PO TABS: 50.0000 mg | ORAL_TABLET | Freq: Two times a day (BID) | ORAL | 0 refills | Status: DC

## 2024-08-23 NOTE — Patient Instructions (Signed)
 Patient seems to be at baseline neurologically.. No focal deficits. Scalp still has some healing to do. Shampoo lightly with mild shampoo. There are still absorbable sutures in place that need to resorb.Remaining staple was removed, Return as needed.

## 2024-08-30 DIAGNOSIS — H2513 Age-related nuclear cataract, bilateral: Secondary | ICD-10-CM | POA: Diagnosis not present

## 2024-08-30 DIAGNOSIS — H5203 Hypermetropia, bilateral: Secondary | ICD-10-CM | POA: Diagnosis not present

## 2024-08-30 DIAGNOSIS — H25013 Cortical age-related cataract, bilateral: Secondary | ICD-10-CM | POA: Diagnosis not present

## 2024-08-31 ENCOUNTER — Ambulatory Visit: Admitting: Podiatry

## 2024-08-31 ENCOUNTER — Telehealth: Payer: Self-pay | Admitting: Podiatry

## 2024-08-31 NOTE — Telephone Encounter (Signed)
 Called and left message for patient to call office to confirm appointment cancellation.

## 2024-09-04 ENCOUNTER — Ambulatory Visit: Admitting: Podiatry

## 2024-09-06 ENCOUNTER — Encounter: Payer: Self-pay | Admitting: Cardiovascular Disease

## 2024-09-06 ENCOUNTER — Ambulatory Visit: Attending: Cardiology | Admitting: Cardiovascular Disease

## 2024-09-06 VITALS — BP 146/82 | HR 79 | Ht 60.0 in | Wt 150.6 lb

## 2024-09-06 DIAGNOSIS — I25119 Atherosclerotic heart disease of native coronary artery with unspecified angina pectoris: Secondary | ICD-10-CM | POA: Insufficient documentation

## 2024-09-06 DIAGNOSIS — I10 Essential (primary) hypertension: Secondary | ICD-10-CM | POA: Insufficient documentation

## 2024-09-06 DIAGNOSIS — E782 Mixed hyperlipidemia: Secondary | ICD-10-CM | POA: Diagnosis not present

## 2024-09-06 NOTE — Progress Notes (Unsigned)
 Cardiology Office Note:    Date:  09/07/2024   ID:  Davey, Bergsma [birth year], MRN [id number]  PCP:  Perri Ronal PARAS, MD   [facility] Providers Cardiologist:  Ozell Fell, MD     Referring MD: Perri Ronal PARAS, MD   Chief Complaint  Patient presents with   Coronary Artery Disease    History of Present Illness:    Carrie Chung is a 83 y.o. female with a hx of coronary artery disease, presenting for follow-up evaluation. The patient initially presented with non-ST elevation infarct in 2013.  She was found to have multivessel coronary artery disease and underwent multivessel stenting at that time.  She has been managed medically since then with no recurrent ischemic events.    She's here alone today. Reports no interval cardiac problems since her last visit. Today, she denies symptoms of palpitations, chest pain, shortness of breath, orthopnea, PND, lower extremity edema, dizziness, or syncope.  She is been under stress with her husband's health.  She otherwise is getting along okay at this time.   Current Medications: Current Meds  Medication Sig   acetaminophen  (TYLENOL ) 325 MG tablet Take 2 tablets (650 mg total) by mouth 4 (four) times daily -  with meals and at bedtime.   alprazolam  (XANAX ) 2 MG tablet TAKE 1 TABLET(2 MG) BY MOUTH AT BEDTIME AS NEEDED FOR SLEEP   Calcium  Citrate-Vitamin D  (CITRUS CALCIUM /VITAMIN D ) 200-6.25 MG-MCG TABS Take 200 mg of elemental calcium  by mouth daily after supper.   clopidogrel  (PLAVIX ) 75 MG tablet TAKE 1 TABLET(75 MG) BY MOUTH DAILY   desvenlafaxine  (PRISTIQ ) 50 MG 24 hr tablet TAKE 1 TABLET(50 MG) BY MOUTH DAILY   Esomeprazole  Magnesium  20 MG TBEC Take 20 mg by mouth daily before breakfast. (Patient taking differently: Take 40 mg by mouth daily before breakfast.)   hydrochlorothiazide  (HYDRODIURIL ) 25 MG tablet Take 1 tablet (25 mg total) by mouth daily.   metoprolol  tartrate (LOPRESSOR ) 50 MG tablet Take 1 tablet (50  mg total) by mouth 2 (two) times daily.   rosuvastatin  (CRESTOR ) 20 MG tablet TAKE 1 TABLET(20 MG) BY MOUTH DAILY     Allergies:   Penicillins, Sulfonamide derivatives, Lactose intolerance (gi), and Sulfamethoxazole   ROS:   Please see the history of present illness.    All other systems reviewed and are negative.  EKGs/Labs/Other Studies Reviewed:    The following studies were reviewed today: Cardiac Studies & Procedures   ______________________________________________________________________________________________   STRESS TESTS  MYOCARDIAL PERFUSION IMAGING 05/20/2023  Interpretation Summary   The study is normal. The study is low risk.   No ST deviation was noted.   LV perfusion is normal. There is no evidence of ischemia. There is no evidence of infarction.   Left ventricular function is normal. Nuclear stress EF: 61 %. The left ventricular ejection fraction is normal (55-65%). End diastolic cavity size is normal. End systolic cavity size is normal.      MONITORS  LONG TERM MONITOR (3-14 DAYS) 06/01/2023  Narrative Patch Wear Time:  2 days and 23 hours (2024-05-25T09:36:44-0400 to 2024-05-28T08:38:40-0400)  Patient had a min HR of 57 bpm, max HR of 197 bpm, and avg HR of 77 bpm. Predominant underlying rhythm was Sinus Rhythm. Slight P wave morphology changes were noted. 65 Supraventricular Tachycardia runs occurred, the run with the fastest interval lasting 4 beats with a max rate of 197 bpm, the longest lasting 16 beats with an avg rate of 115 bpm. Isolated SVEs  were frequent (9.6%, [id number]), SVE Couplets were occasional (1.1%, 1793), and SVE Triplets were rare (<1.0%, 413). Isolated VEs were rare (<1.0%), VE Couplets were rare (<1.0%), and no VE Triplets were present.  Summary: The basic rhythm is normal sinus with an average HR of 77 bpm No atrial fibrillation or flutter No high-grade heart block or pathologic pauses There are rare PVC's and frequent supraventricular  beats without sustained arrhythmias. PAC burden 10%, short supraventricular runs, no sustained run.       ______________________________________________________________________________________________      EKG:        Recent Labs: 09/29/2023: Magnesium  2.1 07/18/2024: Brain Natriuretic Peptide 106; TSH 2.56 08/07/2024: ALT 18; BUN 9; Creatinine, Ser 0.90; Potassium 3.0; Sodium 134 08/08/2024: Hemoglobin 12.6; Platelets 132  Recent Lipid Panel    Component Value Date/Time   CHOL 155 02/29/2024 1017   TRIG 162 (H) 02/29/2024 1017   HDL 63 02/29/2024 1017   CHOLHDL 2.5 02/29/2024 1017   VLDL 41 (H) 03/25/2017 1102   LDLCALC 68 02/29/2024 1017   LDLDIRECT 152.4 03/09/2012 0900         Physical Exam:    VS:  BP (!) 146/82   Pulse 79   Ht 5' (1.524 m)   Wt 150 lb 9.6 oz (68.3 kg)   SpO2 95%   BMI 29.41 kg/m     Wt Readings from Last 3 Encounters:  09/06/24 150 lb 9.6 oz (68.3 kg)  08/22/24 148 lb (67.1 kg)  08/17/24 148 lb (67.1 kg)     GEN:  Well nourished, well developed in no acute distress HEENT: Normal NECK: No JVD; No carotid bruits LYMPHATICS: No lymphadenopathy CARDIAC: RRR, no murmurs, rubs, gallops RESPIRATORY:  Clear to auscultation without rales, wheezing or rhonchi  ABDOMEN: Soft, non-tender, non-distended MUSCULOSKELETAL:  No edema; No deformity  SKIN: Warm and dry NEUROLOGIC:  Alert and oriented x 3 PSYCHIATRIC:  Normal affect   Assessment & Plan Coronary artery disease involving native coronary artery of native heart with angina pectoris [facility]) Patient clinically stable with no anginal symptoms reported on her current medical program.  She is treated with metoprolol  for antianginal therapy.  She is treated with clopidogrel  for antiplatelet therapy, and she is treated with rosuvastatin  for lipid lowering. Mixed hyperlipidemia Most recent labs with cholesterol 155, HDL 63, LDL 68 on rosuvastatin  20 mg daily Essential hypertension Blood pressure  well-controlled on metoprolol  and hydrochlorothiazide .  Last labs with creatinine of 0.9 and potassium of 3.0.  She has not been taking her potassium supplement regularly and she is counseled regarding the importance of this.            Medication Adjustments/Labs and Tests Ordered: Current medicines are reviewed at length with the patient today.  Concerns regarding medicines are outlined above.  No orders of the defined types were placed in this encounter.  No orders of the defined types were placed in this encounter.   Patient Instructions  Medication Instructions:  No medication changes were made at this visit. Continue current regimen.   *If you need a refill on your cardiac medications before your next appointment, please call your pharmacy*  Lab Work: None ordered today. If you have labs (blood work) drawn today and your tests are completely normal, you will receive your results only by: MyChart Message (if you have MyChart) OR A paper copy in the mail If you have any lab test that is abnormal or we need to change your treatment, we will call you to  review the results.  Testing/Procedures: None ordered today.  Follow-Up: At [facility], you and your health needs are our priority.  As part of our continuing mission to provide you with exceptional heart care, our providers are all part of one team.  This team includes your primary Cardiologist (physician) and Advanced Practice Providers or APPs (Physician Assistants and Nurse Practitioners) who all work together to provide you with the care you need, when you need it.  Your next appointment:   1 year(s)  Provider:   Ozell Fell, MD      Signed, Ozell Fell, MD  09/07/2024 11:03 AM    [facility]

## 2024-09-06 NOTE — Patient Instructions (Signed)

## 2024-09-07 NOTE — Assessment & Plan Note (Signed)
 Most recent labs with cholesterol 155, HDL 63, LDL 68 on rosuvastatin  20 mg daily

## 2024-09-25 ENCOUNTER — Ambulatory Visit: Admitting: Podiatry

## 2024-10-20 ENCOUNTER — Encounter: Payer: Self-pay | Admitting: Internal Medicine

## 2024-10-20 ENCOUNTER — Ambulatory Visit (INDEPENDENT_AMBULATORY_CARE_PROVIDER_SITE_OTHER): Admitting: Internal Medicine

## 2024-10-20 VITALS — HR 78 | Temp 98.3°F | Ht 60.0 in | Wt 152.0 lb

## 2024-10-20 DIAGNOSIS — S0101XD Laceration without foreign body of scalp, subsequent encounter: Secondary | ICD-10-CM

## 2024-10-20 DIAGNOSIS — D224 Melanocytic nevi of scalp and neck: Secondary | ICD-10-CM | POA: Diagnosis not present

## 2024-10-20 DIAGNOSIS — Z23 Encounter for immunization: Secondary | ICD-10-CM | POA: Diagnosis not present

## 2024-10-20 NOTE — Patient Instructions (Addendum)
 Referral to [facility] where patient has been seen previously to check a dark  nevus on scalp near a recent scalp laceration. I have left her daughter a voice mail regarding the referral.

## 2024-10-20 NOTE — Progress Notes (Addendum)
 Patient Care Team: Perri Ronal PARAS, MD as PCP - General (Internal Medicine) Wonda Sharper, MD as PCP - Cardiology (Cardiology) Perri Ronal PARAS, MD (Internal Medicine)  Visit Date: 10/20/24  Subjective:    Patient ID: Carrie Chung , Female   DOB: [birth year], 83 y.o.    MRN: [id number]   83 y.o. Female presents today for follow up on head injury. Patient has a past medical history of Hypertension, Hyperlipidemia, Impaired glucose tolerance.  On August 18 of this year, she fell while in her home and was seen at [facility] for a laceration on her scalp that required 8 stitches to repair. On 08/17/2024 She was seen in this office to remove the staples. 7 staples were removed as it was suspected the remaining staple was hidden by dried blood. The final staple was removed on 08/22/24. The laceration is healed but she says the area is sore and there is a patch of redness where the laceration was.    Physical exam showed she has a nevus on her scalp. She was recommended to go see a dermatologist so that they could examine it further.   Vaccine counseling: Influenza vaccine received today. Covid-19 vaccine due.  Past Medical History:  Diagnosis Date   Atrial tachycardia    a. noted at cardiac rehab 5/13; event monitor ordered to assess for AFib   CAD (coronary artery disease)    a. NSTEMI 03/2012 (LHC 03/27/12: pLAD 30%, mLAD 99%, mCFX 95%, mRCA 99% with thrombus, EF 65%);  s/p PTCA/DESx1 to prox-mid RCA 03/27/12 urgently in setting of hypotension/bradycardia, with staged PTCA/Evolve study stent to mid LAD & PTCA/Evolve study stent to prox LCx 03/29/12 ;   echo 03/28/12:EF 60%, Aortic sclerosis without AS, mild RVE, mild reduced RVSF     Endometrial polyp    HTN (hypertension)    Hyperlipidemia    MI, old    Sleep related hypoxia      Family History  Problem Relation Age of Onset   Coronary artery disease Mother        CABG at age 37, PPM   Colon cancer Father    Cancer - Other Father         larnyx    Social History   Social History Narrative   Caffeine 2 cups tea.      Review of Systems  Skin:        Scalp soreness and redness         Objective:   Vitals: Pulse 78   Temp 98.3 F (36.8 C)   Ht 5' (1.524 m)   Wt 152 lb (68.9 kg)   SpO2 96%   BMI 29.69 kg/m    Physical Exam Skin:    Findings: Erythema and lesion present.     Comments: Nevus on scalp.        Results:   Labs:       Component Value Date/Time   NA 134 (L) 08/07/2024 2236   NA 142 05/13/2022 1142   K 3.0 (L) 08/07/2024 2236   CL 93 (L) 08/07/2024 2236   CO2 26 08/07/2024 2226   GLUCOSE 152 (H) 08/07/2024 2236   BUN 9 08/07/2024 2236   BUN 11 05/13/2022 1142   CREATININE 0.90 08/07/2024 2236   CREATININE 0.72 07/18/2024 1553   CALCIUM  9.1 08/07/2024 2226   PROT 6.4 (L) 08/07/2024 2226   PROT 6.7 12/29/2017 1427   ALBUMIN 3.6 08/07/2024 2226   ALBUMIN 4.2 12/29/2017 1427  AST 30 08/07/2024 2226   ALT 18 08/07/2024 2226   ALKPHOS 72 08/07/2024 2226   BILITOT 0.4 08/07/2024 2226   BILITOT 0.3 12/29/2017 1427   GFRNONAA >60 08/07/2024 2226   GFRNONAA 68 08/16/2020 1156   GFRAA 79 08/16/2020 1156     Lab Results  Component Value Date   WBC 8.3 08/08/2024   HGB 12.6 08/08/2024   HCT 37.0 08/08/2024   MCV 88.9 08/08/2024   PLT 132 (L) 08/08/2024    Lab Results  Component Value Date   CHOL 155 02/29/2024   HDL 63 02/29/2024   LDLCALC 68 02/29/2024   LDLDIRECT 152.4 03/09/2012   TRIG 162 (H) 02/29/2024   CHOLHDL 2.5 02/29/2024    Lab Results  Component Value Date   HGBA1C 6.6 (H) 02/29/2024     Lab Results  Component Value Date   TSH 2.56 07/18/2024       Assessment & Plan:    Orders Placed This Encounter  Procedures   Flu vaccine HIGH DOSE PF(Fluzone Trivalent)   Ambulatory referral to Dermatology    Referral Priority:   Routine    Referral Type:   Consultation    Referral Reason:   Specialty Services Required    Referred to Provider:    Jordan, Amy, MD    Requested Specialty:   Dermatology    Number of Visits Requested:   1    Laceration of scalp: On August 18 of this year, she fell while in her home and was seen at [facility] for a laceration on her scalp that required 8 stitches to repair. On 08/17/2024 She was seen in this office to remove the staples. 7 staples were removed as it was suspected the remaining staple was hidden by dried blood. The final staple was removed on 08/22/24. The laceration is healed but she says the area is sore and there is a patch of redness where the laceration was. Laceration is healing well. No sign of redness or drainage.  She was informed that there were no signs of infection. Laceration is healing well. Ok to shampoo and  color her hair.    Nevus of scalp: Physical exam showed she has a nevus on her scalp. She was recommended to go see a dermatologist so that they could examine it further.   Referred to [facility] where she has been seen before- she cannot remember name of Female Dermatologist she sees.  Vaccine counseling: Influenza vaccine received today. Covid-19 vaccine due. May receive Covid vaccine at Pharmacy.  I,Makayla C Reid,acting as a scribe for Ronal JINNY Hailstone, MD.,have documented all relevant documentation on the behalf of Ronal JINNY Hailstone, MD,as directed by  Ronal JINNY Hailstone, MD while in the presence of Ronal JINNY Hailstone, MD.

## 2024-10-30 DIAGNOSIS — D485 Neoplasm of uncertain behavior of skin: Secondary | ICD-10-CM | POA: Diagnosis not present

## 2024-11-03 ENCOUNTER — Other Ambulatory Visit

## 2024-11-03 DIAGNOSIS — R413 Other amnesia: Secondary | ICD-10-CM

## 2024-11-03 DIAGNOSIS — Z7901 Long term (current) use of anticoagulants: Secondary | ICD-10-CM

## 2024-11-03 DIAGNOSIS — E7849 Other hyperlipidemia: Secondary | ICD-10-CM | POA: Diagnosis not present

## 2024-11-03 DIAGNOSIS — R7302 Impaired glucose tolerance (oral): Secondary | ICD-10-CM

## 2024-11-03 DIAGNOSIS — E119 Type 2 diabetes mellitus without complications: Secondary | ICD-10-CM | POA: Diagnosis not present

## 2024-11-03 DIAGNOSIS — Z0000 Encounter for general adult medical examination without abnormal findings: Secondary | ICD-10-CM | POA: Diagnosis not present

## 2024-11-03 DIAGNOSIS — I10 Essential (primary) hypertension: Secondary | ICD-10-CM

## 2024-11-04 LAB — LIPID PANEL
Cholesterol: 168 mg/dL (ref <200)
HDL: 78 mg/dL (ref >=50)
LDL Cholesterol (Calc): 66 mg/dL
Non-HDL Cholesterol (Calc): 90 mg/dL (ref <130)
Total CHOL/HDL Ratio: 2.2 (calc) (ref <5.0)
Triglycerides: 165 mg/dL — ABNORMAL HIGH (ref <150)

## 2024-11-04 LAB — CBC WITH DIFFERENTIAL/PLATELET
Absolute Lymphocytes: 1090 {cells}/uL (ref 850–3900)
Absolute Monocytes: 418 {cells}/uL (ref 200–950)
Basophils Absolute: 17 {cells}/uL (ref 0–200)
Basophils Relative: 0.3 %
Eosinophils Absolute: 52 {cells}/uL (ref 15–500)
Eosinophils Relative: 0.9 %
HCT: 40.5 % (ref 35.0–45.0)
Hemoglobin: 12.7 g/dL (ref 11.7–15.5)
MCH: 26.8 pg — ABNORMAL LOW (ref 27.0–33.0)
MCHC: 31.4 g/dL — ABNORMAL LOW (ref 32.0–36.0)
MCV: 85.6 fL (ref 80.0–100.0)
MPV: 11 fL (ref 7.5–12.5)
Monocytes Relative: 7.2 %
Neutro Abs: 4222 {cells}/uL (ref 1500–7800)
Neutrophils Relative %: 72.8 %
Platelets: 211 Thousand/uL (ref 140–400)
RBC: 4.73 Million/uL (ref 3.80–5.10)
RDW: 13.5 % (ref 11.0–15.0)
Total Lymphocyte: 18.8 %
WBC: 5.8 Thousand/uL (ref 3.8–10.8)

## 2024-11-04 LAB — COMPREHENSIVE METABOLIC PANEL WITH GFR
AG Ratio: 1.8 (calc) (ref 1.0–2.5)
ALT: 8 U/L (ref 6–29)
AST: 16 U/L (ref 10–35)
Albumin: 4.6 g/dL (ref 3.6–5.1)
Alkaline phosphatase (APISO): 70 U/L (ref 37–153)
BUN: 9 mg/dL (ref 7–25)
CO2: 36 mmol/L — ABNORMAL HIGH (ref 20–32)
Calcium: 9.7 mg/dL (ref 8.6–10.4)
Chloride: 97 mmol/L — ABNORMAL LOW (ref 98–110)
Creat: 0.74 mg/dL (ref 0.60–0.95)
Globulin: 2.6 g/dL (ref 1.9–3.7)
Glucose, Bld: 124 mg/dL — ABNORMAL HIGH (ref 65–99)
Potassium: 3.9 mmol/L (ref 3.5–5.3)
Sodium: 142 mmol/L (ref 135–146)
Total Bilirubin: 0.4 mg/dL (ref 0.2–1.2)
Total Protein: 7.2 g/dL (ref 6.1–8.1)
eGFR: 80 mL/min/1.73m2 (ref >=60)

## 2024-11-04 LAB — MICROALBUMIN / CREATININE URINE RATIO
Creatinine, Urine: 84 mg/dL (ref 20–275)
Microalb Creat Ratio: 17 mg/g{creat} (ref <30)
Microalb, Ur: 1.4 mg/dL

## 2024-11-04 LAB — TSH: TSH: 2.46 m[IU]/L (ref 0.40–4.50)

## 2024-11-04 LAB — HEMOGLOBIN A1C
Hgb A1c MFr Bld: 6.1 % — ABNORMAL HIGH (ref <5.7)
Mean Plasma Glucose: 128 mg/dL
eAG (mmol/L): 7.1 mmol/L

## 2024-11-10 ENCOUNTER — Ambulatory Visit: Admitting: Internal Medicine

## 2024-11-20 ENCOUNTER — Ambulatory Visit: Admitting: Internal Medicine

## 2024-11-20 DIAGNOSIS — Z0279 Encounter for issue of other medical certificate: Secondary | ICD-10-CM

## 2024-11-20 NOTE — Progress Notes (Incomplete)
 Annual Wellness Visit   Patient Care Team: Baxley, Ronal PARAS, MD as PCP - General (Internal Medicine) Wonda Sharper, MD as PCP - Cardiology (Cardiology) Perri Ronal PARAS, MD (Internal Medicine)  Visit Date: 11/20/24   No chief complaint on file.  Subjective:  Patient: Carrie Chung, Female DOB: 15-Jan-1941, 83 y.o. MRN: [id number] There were no vitals filed for this visit. Carrie Chung is a 83 y.o. Female who presents today for her Annual Wellness Visit. Patient has HYPERTENSION, BENIGN; Dyspnea; Prolonged QT interval; Non-ST elevation myocardial infarction (NSTEMI), initial care episode (HCC); Coronary Artery Disease; Hyperlipidemia; Anxiety; Insomnia; Low back pain; History of MRSA infection; [place]; MCI (mild cognitive impairment); Depression with anxiety; Hypoxemic respiratory failure, chronic (HCC); Cognitive complaints; Olfactory aura; Closed left hip fracture, initial encounter (HCC); Hypokalemia; Intertrochanteric fracture of left hip (HCC); Pain due to onychomycosis of toenails of both feet; Closed right hip fracture, initial encounter (HCC); Closed right hip fracture, sequela; UTI (urinary tract infection); Acute blood loss anemia; Constipation; and Diabetes (HCC) on their problem list.   On August 18 of this year, she fell while in her home and was seen at [facility] for a laceration on her scalp that required 8 stitches to repair. On 08/17/2024 She was seen in this office to remove the staples. 7 staples were removed as it was suspected the remaining staple was hidden by dried blood. The final staple was removed on 08/22/24. The laceration is healed but she says the area is sore and there is a patch of redness where the laceration was.    History of insomnia treated with alprazolam  2 mg at bedtime as needed.   History of impaired glucose tolerance. HGBA1c at 6.6% on 08/30/23, up from 6.0% one year ago.     History of coronary artery disease, palpitations, hyperlipidemia,  hypertension treated with metoprolol  tartrate 100 mg twice daily, hydrochlorothiazide  25 mg daily, rosuvastatin  20 mg daily, aspirin , clopidogrel . Followed by cardiologist, Dr. Sharper Wonda. Status post PTCA with drug-eluting stent x1 to the mid RCA in April 2013. She returned to the Cath Lab and had stents placed in the mid LAD and proximal circumflex arteries. TRIG elevated at 172.   Had some iron deficiency on October 06 2021 labs. Iron level was 40. Hemoglobin decreased to a low of 8 g July 03, 2021. MCV was elevated in July 2022 at 101.2. CBC normal on 08/30/23.   History of GERD treated with esomeprazole  40 mg daily.    Quit smoking in 1982.   History of mild cognitive deficits followed by neurology. History of lumbar back pain. History of impaired glucose tolerance which is mild.   She sees Dr. Alva for use of nocturnal oxygen . Found to have oxygen  desaturation in November 2020 to less than 88% for 1 hour 45 minutes during sleep. Normal PFTs in 2016. Has not been found to have significant sleep apnea previously so Dr. Betty has elected not to pursue sleep study. She has mild emphysema as she is a former smoker but no airway obstruction on PFTs. Has about a 10-pack-year history of smoking and quit in 1982 according to Dr. Cyndi records    Husband not able to drive- has early dementia. She says he is stable.    Labs ***/***/*** {Labs (Optional):31667}  Health Maintenance  Topic Date Due   Medicare Annual Wellness (AWV)  09/05/2024   OPHTHALMOLOGY EXAM  12/06/2024 (Originally 05/23/2023)   FOOT EXAM  03/02/2025   HEMOGLOBIN A1C  05/03/2025  Diabetic kidney evaluation - eGFR measurement  11/03/2025   Diabetic kidney evaluation - Urine ACR  11/03/2025   DTaP/Tdap/Td (3 - Td or Tdap) 08/07/2034   Pneumococcal Vaccine: 50+ Years  Completed   Influenza Vaccine  Completed   Bone Density Scan  Completed   Meningococcal B Vaccine  Aged Out   COVID-19 Vaccine  Discontinued   Zoster  Vaccines- Shingrix  Discontinued    {Man or Woman:32389}  Vaccine Counseling: Due for {Vaccines:32291::Influenza}; UTD on {Vaccines:32291::Influenza}  ROS Objective:  Vitals: body mass index is unknown because there is no height or weight on file.There were no vitals filed for this visit. Physical Exam  Current Outpatient Medications  Medication Instructions   acetaminophen  (TYLENOL ) 650 mg, Oral, 3 times daily with meals & bedtime   alprazolam  (XANAX ) 2 MG tablet TAKE 1 TABLET(2 MG) BY MOUTH AT BEDTIME AS NEEDED FOR SLEEP   Calcium  Citrate-Vitamin D  (CITRUS CALCIUM /VITAMIN D ) 200-6.25 MG-MCG TABS 200 mg of elemental calcium , Oral, Daily after supper   clopidogrel  (PLAVIX ) 75 MG tablet TAKE 1 TABLET(75 MG) BY MOUTH DAILY   desvenlafaxine  (PRISTIQ ) 50 MG 24 hr tablet TAKE 1 TABLET(50 MG) BY MOUTH DAILY   Esomeprazole  Magnesium  20 mg, Daily before breakfast   hydrochlorothiazide  (HYDRODIURIL ) 25 mg, Oral, Daily   metoprolol  tartrate (LOPRESSOR ) 50 mg, Oral, 2 times daily   rosuvastatin  (CRESTOR ) 20 MG tablet TAKE 1 TABLET(20 MG) BY MOUTH DAILY   Past Medical History:  Diagnosis Date   Atrial tachycardia    a. noted at cardiac rehab 5/13; event monitor ordered to assess for AFib   CAD (coronary artery disease)    a. NSTEMI 03/2012 ([facility]HC 03/27/12: pLAD 30%, mLAD 99%, mCFX 95%, mRCA 99% with thrombus, EF 65%);  s/p PTCA/DESx1 to prox-mid RCA 03/27/12 urgently in setting of hypotension/bradycardia, with staged PTCA/Evolve study stent to mid LAD & PTCA/Evolve study stent to prox LCx 03/29/12 ;   echo 03/28/12:EF 60%, Aortic sclerosis without AS, mild RVE, mild reduced RVSF     Endometrial polyp    HTN (hypertension)    Hyperlipidemia    MI, old    Sleep related hypoxia    Medical/Surgical History Narrative:  Allergic/Intolerant to:  Allergies  Allergen Reactions   Penicillins Hives, Swelling and Other (See Comments)   Sulfonamide Derivatives Hives and Swelling   Lactose Intolerance (Gi)  Diarrhea   Sulfamethoxazole Swelling   *** - ***  *** - ***  *** - ***  *** - ***  *** - ***  *** - ***  *** - ***  *** - *** Other - Hx of: *** ; Surghx of: *** Past Surgical History:  Procedure Laterality Date   APPENDECTOMY  83 years old   CAROTID STENT  04/2012   x3   CESAREAN SECTION  1984   CHOLECYSTECTOMY  1978   FEMUR IM NAIL Right 09/21/2023   Procedure: INTRAMEDULLARY (IM) NAIL FEMORAL;  Surgeon: Beverley Evalene BIRCH, MD;  Location: [facility] ORS;  Service: Orthopedics;  Laterality: Right;   INTRAMEDULLARY (IM) NAIL INTERTROCHANTERIC Left 06/20/2021   Procedure: INTRAMEDULLARY (IM) NAIL INTERTROCHANTRIC;  Surgeon: Beverley Evalene BIRCH, MD;  Location: [facility] OR;  Service: Orthopedics;  Laterality: Left;   LEFT HEART CATHETERIZATION WITH CORONARY ANGIOGRAM N/A 03/27/2012   Procedure: LEFT HEART CATHETERIZATION WITH CORONARY ANGIOGRAM;  Surgeon: Lonni BIRCH Cash, MD;  Location: [facility] CATH LAB;  Service: Cardiovascular;  Laterality: N/A;   PERCUTANEOUS CORONARY STENT INTERVENTION (PCI-S) N/A 03/27/2012   Procedure: PERCUTANEOUS CORONARY STENT INTERVENTION (PCI-S);  Surgeon: Lonni JONETTA Cash, MD;  Location: [facility] CATH LAB;  Service: Cardiovascular;  Laterality: N/A;   PERCUTANEOUS CORONARY STENT INTERVENTION (PCI-S) N/A 03/29/2012   Procedure: PERCUTANEOUS CORONARY STENT INTERVENTION (PCI-S);  Surgeon: Ozell Fell, MD;  Location: [facility] CATH LAB;  Service: Cardiovascular;  Laterality: N/A;   TONSILLECTOMY  83 years old   Family History  Problem Relation Age of Onset   Coronary artery disease Mother        CABG at age 66, PPM   Colon cancer Father    Cancer - Other Father        larnyx   Family History Narrative: {ELFamHX:31110} Social History   Social History Narrative   Caffeine 2 cups tea.   Most Recent Health Risks Assessment:   Most Recent Social Determinants of Health (Including Hx of Tobacco, Alcohol, and Drug Use) SDOH Screenings   Food Insecurity: No Food Insecurity  (09/20/2023)  Housing: Low Risk  (09/20/2023)  Transportation Needs: No Transportation Needs (09/20/2023)  Utilities: Not At Risk (09/20/2023)  Alcohol Screen: Low Risk  (09/03/2021)  Depression (PHQ2-9): Low Risk  (10/20/2024)  Financial Resource Strain: Low Risk  (09/03/2021)  Physical Activity: Inactive (09/03/2021)  Social Connections: Socially Integrated (09/03/2021)  Stress: No Stress Concern Present (09/03/2021)  Tobacco Use: Medium Risk (10/20/2024)  Health Literacy: Adequate Health Literacy (09/06/2023)   Social History   Tobacco Use   Smoking status: Former    Current packs/day: 0.00    Types: Cigarettes    Quit date: 04/19/1980    Years since quitting: 44.6   Smokeless tobacco: Never  Substance Use Topics   Alcohol use: Yes    Alcohol/week: 20.0 standard drinks of alcohol    Types: 20 Glasses of wine per week    Comment: socially   Drug use: No   Most Recent Functional Status Assessment:     No data to display         Most Recent Fall Risk Assessment:    09/06/2023   11:06 AM  Fall Risk   Falls in the past year? 0  Number falls in past yr: 0  Injury with Fall? 0  Risk for fall due to : No Fall Risks  Follow up Falls evaluation completed;Falls prevention discussed   Most Recent Anxiety/Depression Screenings:    10/20/2024   12:35 PM 07/18/2024    2:28 PM  PHQ 2/9 Scores  PHQ - 2 Score 2 2  PHQ- 9 Score 3  6      Data saved with a previous flowsheet row definition       No data to display         Most Recent Cognitive Screening:    09/06/2023   11:01 AM  6CIT Screen  What Year? 0 points  What month? 0 points  What time? 0 points  Count back from 20 0 points  Months in reverse 0 points  Repeat phrase 0 points  Total Score 0 points   Most Recent Vision/Hearing Screenings:No results found. Results:  Studies Obtained And Personally Reviewed By Me: Diabetic Foot Exam - Simple   No data filed     {Imaging, colonoscopy, mammogram, bone density  scan, echocardiogram, heart cath, stress test, CT calcium  score, etc.:32292}  Labs:  CBC w/ Differential Lab Results  Component Value Date   WBC 5.8 11/03/2024   RBC 4.73 11/03/2024   HGB 12.7 11/03/2024   HCT 40.5 11/03/2024   PLT 211 11/03/2024   MCV 85.6 11/03/2024   MCH  26.8 (L) 11/03/2024   MCHC 31.4 (L) 11/03/2024   RDW 13.5 11/03/2024   MPV 11.0 11/03/2024   LYMPHSABS 1.1 10/16/2023   MONOABS 0.4 10/16/2023   BASOSABS 17 11/03/2024    Comprehensive Metabolic Panel Lab Results  Component Value Date   NA 142 11/03/2024   K 3.9 11/03/2024   CL 97 (L) 11/03/2024   CO2 36 (H) 11/03/2024   GLUCOSE 124 (H) 11/03/2024   BUN 9 11/03/2024   CREATININE 0.74 11/03/2024   CALCIUM  9.7 11/03/2024   PROT 7.2 11/03/2024   ALBUMIN 3.6 08/07/2024   AST 16 11/03/2024   ALT 8 11/03/2024   ALKPHOS 72 08/07/2024   BILITOT 0.4 11/03/2024   GFR 73.30 06/21/2015   EGFR 80 11/03/2024   GFRNONAA >60 08/07/2024   Lipid Panel  Lab Results  Component Value Date   CHOL 168 11/03/2024   HDL 78 11/03/2024   LDLCALC 66 11/03/2024   TRIG 165 (H) 11/03/2024   A1c Lab Results  Component Value Date   HGBA1C 6.1 (H) 11/03/2024    TSH Lab Results  Component Value Date   TSH 2.46 11/03/2024   PSA{PSA (Optional):32132} No results found for any visits on 11/20/24. Assessment & Plan:  No orders of the defined types were placed in this encounter.  No orders of the defined types were placed in this encounter.  Other Labs Reviewed today:    No follow-ups on file.   {ELAWVOptions:31280} done today including the all of the following: Reviewed patient's Family Medical History Reviewed patient's SDOH and reviewed tobacco, alcohol, and drug use.  Reviewed and updated list of patient's medical providers Assessment of cognitive impairment was done Assessed patient's functional ability Established a written schedule for health screening services Health Risk Assessent Completed and  Reviewed  Discussed health benefits of physical activity, and encouraged her to engage in regular exercise appropriate for her age and condition.   I,Emily Lagle,acting as a neurosurgeon for Ronal JINNY Hailstone, MD.,have documented all relevant documentation on the behalf of Ronal JINNY Hailstone, MD,as directed by  Ronal JINNY Hailstone, MD while in the presence of Ronal JINNY Hailstone, MD.  I, Ronal JINNY Hailstone, MD, have reviewed all documentation for and agree with the above Annual Wellness Visit documentation.  Ronal JINNY Hailstone, MD Internal Medicine 11/20/2024

## 2024-11-23 ENCOUNTER — Telehealth: Payer: Self-pay | Admitting: Cardiovascular Disease

## 2024-11-24 NOTE — Progress Notes (Signed)
 "  Annual Wellness Visit   Patient Care Team: Jacqualine Weichel, Ronal PARAS, MD as PCP - General (Internal Medicine) Wonda Sharper, MD as PCP - Cardiology (Cardiology) Perri Ronal PARAS, MD (Internal Medicine)  Visit Date: 12/08/2024   Chief Complaint  Patient presents with   Annual Exam   Subjective:  Patient: Carrie Chung, Female DOB: Sep 22, 1941, 83 y.o. MRN: [id number] Vitals:   12/08/24 0958  BP: 120/80   Carrie Chung is a 83 y.o. Female who presents today for her Annual Wellness Visit. Patient has HYPERTENSION, BENIGN; Dyspnea; Prolonged QT interval; Non-ST elevation myocardial infarction (NSTEMI), initial care episode (HCC); Coronary Artery Disease; Hyperlipidemia; Anxiety; Insomnia; Low back pain; History of MRSA infection; [place]; MCI (mild cognitive impairment); Depression with anxiety; Hypoxemic respiratory failure, chronic (HCC); Cognitive complaints; Olfactory aura; Closed left hip fracture, initial encounter (HCC); Hypokalemia; Intertrochanteric fracture of left hip (HCC); Pain due to onychomycosis of toenails of both feet; Closed right hip fracture, initial encounter (HCC); Closed right hip fracture, sequela; UTI (urinary tract infection); Acute blood loss anemia; Constipation; and Diabetes (HCC) on their problem list.   On August 18 of this year, she fell while in her home and was seen at [facility] for a laceration on her scalp that required 8 staples to repair. On 08/17/2024 She was seen in this office to remove the staples. 7 staples were removed as it was suspected the remaining staple was hidden by dried blood. The final staple was removed on 08/22/24.    History of insomnia treated with Alprazolam  2 mg at bedtime. No side effects from this. Works well with her anxiety.   History of impaired glucose tolerance.  11/03/2024 HgbA1c 6.1% decreased from 6.6% nine months ago. At her age, this Hgb AIC is quite acceptable.  History of coronary artery disease, palpitations,  hyperlipidemia, hypertension treated with metoprolol  tartrate 100 mg twice daily, hydrochlorothiazide  25 mg daily, rosuvastatin  20 mg daily, aspirin , clopidogrel . Followed by cardiologist, Dr. Sharper Wonda. Status post PTCA with drug-eluting stent x1 to the mid RCA in April 2013. She returned to the Cath Lab and had stents placed in the mid LAD and proximal circumflex arteries.    History of GERD treated with esomeprazole  40 mg daily.    Quit smoking in 1982.   History of mild cognitive deficits followed by neurology.    History of lumbar back pain.   She sees Dr. Alva for use of nocturnal oxygen . Found to have oxygen  desaturation in November 2020 to less than 88% for 1 hour 45 minutes during sleep. Normal PFTs in 2016. Has not been found to have significant sleep apnea previously so Dr. Betty has elected not to pursue sleep study. She has mild emphysema as she is a former smoker but no airway obstruction on PFTs. Has about a 10-pack-year history of smoking and quit in 1982 according to Dr. Cyndi records    Husband not able to drive- has early dementia. She says he is stable.   Labs 11/03/2024 MCH 26.8, MCHC 31.4, Blood glucose 124, Chloride 97, CO2 36, HgbA1c 6.1%, Triglycerides 165, Otherwise WNL.    Health maintenance: Had eye exam in the fall.    Had Colonoscopy by Dr.Kaplan in 2005.  Health Maintenance  Topic Date Due   Medicare Annual Wellness (AWV)  09/05/2024   FOOT EXAM  03/02/2025   HEMOGLOBIN A1C  05/03/2025   OPHTHALMOLOGY EXAM  08/30/2025   Diabetic kidney evaluation - eGFR measurement  11/03/2025   Diabetic kidney evaluation -  Urine ACR  11/03/2025   DTaP/Tdap/Td (3 - Td or Tdap) 08/07/2034   Pneumococcal Vaccine: 50+ Years  Completed   Influenza Vaccine  Completed   Bone Density Scan  Completed   Meningococcal B Vaccine  Aged Out   COVID-19 Vaccine  Discontinued   Zoster Vaccines- Shingrix  Discontinued   Review of Systems  Constitutional:  Negative  for fever and malaise/fatigue.  HENT:  Negative for congestion.   Eyes:  Negative for blurred vision.  Respiratory:  Negative for cough and shortness of breath.   Cardiovascular:  Negative for chest pain, palpitations and leg swelling.  Gastrointestinal:  Negative for vomiting.  Musculoskeletal:  Negative for back pain.  Skin:  Negative for rash.  Neurological:  Negative for loss of consciousness and headaches.   Objective:  Vitals: body mass index is 29.69 kg/m. Today's Vitals   12/08/24 0958  BP: 120/80  Pulse: 73  SpO2: 95%  Weight: 152 lb (68.9 kg)  Height: 5' (1.524 m)  PainSc: 0-No pain   Physical Exam Vitals and nursing note reviewed.  Constitutional:      General: She is not in acute distress.    Appearance: Normal appearance. She is not ill-appearing or toxic-appearing.  HENT:     Head: Normocephalic and atraumatic.     Right Ear: Hearing, tympanic membrane, ear canal and external ear normal.     Left Ear: Hearing, tympanic membrane, ear canal and external ear normal.     Mouth/Throat:     Pharynx: Oropharynx is clear.  Eyes:     Extraocular Movements: Extraocular movements intact.     Pupils: Pupils are equal, round, and reactive to light.  Neck:     Thyroid : No thyroid  mass, thyromegaly or thyroid  tenderness.     Vascular: No carotid bruit.  Cardiovascular:     Rate and Rhythm: Normal rate and regular rhythm. No extrasystoles are present.    Pulses:          Dorsalis pedis pulses are 2+ on the right side and 2+ on the left side.     Heart sounds: Normal heart sounds. No murmur heard.    No friction rub. No gallop.  Pulmonary:     Effort: Pulmonary effort is normal.     Breath sounds: Normal breath sounds. No decreased breath sounds, wheezing, rhonchi or rales.  Chest:     Chest wall: No mass.  Abdominal:     Palpations: Abdomen is soft. There is no hepatomegaly, splenomegaly or mass.     Tenderness: There is no abdominal tenderness.     Hernia: No  hernia is present.  Musculoskeletal:     Cervical back: Normal range of motion.     Right lower leg: No edema.     Left lower leg: Edema present.  Lymphadenopathy:     Cervical: No cervical adenopathy.     Upper Body:     Right upper body: No supraclavicular adenopathy.     Left upper body: No supraclavicular adenopathy.  Skin:    General: Skin is warm and dry.  Neurological:     General: No focal deficit present.     Mental Status: She is alert and oriented to person, place, and time. Mental status is at baseline.     Sensory: Sensation is intact.     Motor: Motor function is intact. No weakness.     Deep Tendon Reflexes: Reflexes are normal and symmetric.  Psychiatric:        Attention  and Perception: Attention normal.        Mood and Affect: Mood normal.        Speech: Speech normal.        Behavior: Behavior normal.        Thought Content: Thought content normal.        Cognition and Memory: Cognition normal.        Judgment: Judgment normal.     Current Outpatient Medications  Medication Instructions   acetaminophen  (TYLENOL ) 650 mg, Oral, 3 times daily with meals & bedtime   alprazolam  (XANAX ) 2 MG tablet TAKE 1 TABLET(2 MG) BY MOUTH AT BEDTIME AS NEEDED FOR SLEEP   Calcium  Citrate-Vitamin D  (CITRUS CALCIUM /VITAMIN D ) 200-6.25 MG-MCG TABS 200 mg of elemental calcium , Oral, Daily after supper   clopidogrel  (PLAVIX ) 75 MG tablet TAKE 1 TABLET(75 MG) BY MOUTH DAILY   desvenlafaxine  (PRISTIQ ) 50 MG 24 hr tablet TAKE 1 TABLET(50 MG) BY MOUTH DAILY   Esomeprazole  Magnesium  20 mg, Daily before breakfast   hydrochlorothiazide  (HYDRODIURIL ) 25 MG tablet TAKE 1 TABLET(25 MG) BY MOUTH DAILY   metoprolol  tartrate (LOPRESSOR ) 50 MG tablet TAKE 1 TABLET(50 MG) BY MOUTH TWICE DAILY   rosuvastatin  (CRESTOR ) 20 MG tablet TAKE 1 TABLET(20 MG) BY MOUTH DAILY   Past Medical History:  Diagnosis Date   Atrial tachycardia    a. noted at cardiac rehab 5/13; event monitor ordered  to assess for AFib   CAD (coronary artery disease)    a. NSTEMI 03/2012 (LHC 03/27/12: pLAD 30%, mLAD 99%, mCFX 95%, mRCA 99% with thrombus, EF 65%);  s/p PTCA/DESx1 to prox-mid RCA 03/27/12 urgently in setting of hypotension/bradycardia, with staged PTCA/Evolve study stent to mid LAD & PTCA/Evolve study stent to prox LCx 03/29/12 ;   echo 03/28/12:EF 60%, Aortic sclerosis without AS, mild RVE, mild reduced RVSF     Endometrial polyp    HTN (hypertension)    Hyperlipidemia    MI, old    Sleep related hypoxia    Medical/Surgical History Narrative:  Allergic/Intolerant to:  Allergies  Allergen Reactions   Penicillins Hives, Swelling and Other (See Comments)   Sulfonamide Derivatives Hives and Swelling   Lactose Intolerance (Gi) Diarrhea   Sulfamethoxazole Swelling   Past Surgical History:  Procedure Laterality Date   APPENDECTOMY  83 years old   CAROTID STENT  04/2012   x3   CESAREAN SECTION  1984   CHOLECYSTECTOMY  1978   FEMUR IM NAIL Right 09/21/2023   Procedure: INTRAMEDULLARY (IM) NAIL FEMORAL;  Surgeon: Beverley Evalene BIRCH, MD;  Location: [facility]S;  Service: Orthopedics;  Laterality: Right;   INTRAMEDULLARY (IM) NAIL INTERTROCHANTERIC Left 06/20/2021   Procedure: INTRAMEDULLARY (IM) NAIL INTERTROCHANTRIC;  Surgeon: Beverley Evalene BIRCH, MD;  Location: [facility] OR;  Service: Orthopedics;  Laterality: Left;   LEFT HEART CATHETERIZATION WITH CORONARY ANGIOGRAM N/A 03/27/2012   Procedure: LEFT HEART CATHETERIZATION WITH CORONARY ANGIOGRAM;  Surgeon: Lonni BIRCH Cash, MD;  Location: [facility] CATH LAB;  Service: Cardiovascular;  Laterality: N/A;   PERCUTANEOUS CORONARY STENT INTERVENTION (PCI-S) N/A 03/27/2012   Procedure: PERCUTANEOUS CORONARY STENT INTERVENTION (PCI-S);  Surgeon: Lonni BIRCH Cash, MD;  Location: [facility] CATH LAB;  Service: Cardiovascular;  Laterality: N/A;   PERCUTANEOUS CORONARY STENT INTERVENTION (PCI-S) N/A 03/29/2012   Procedure: PERCUTANEOUS CORONARY STENT INTERVENTION  (PCI-S);  Surgeon: Ozell Fell, MD;  Location: [facility] CATH LAB;  Service: Cardiovascular;  Laterality: N/A;   TONSILLECTOMY  83 years old   Family History  Problem Relation Age of Onset  Coronary artery disease Mother        CABG at age 58, PPM   Colon cancer Father    Cancer - Other Father        larnyx   Social History   Social History Narrative   Caffeine 2 cups tea.  Social history: Husband is a retired careers information officer. Patient has an adult daughter and 2 grandchildren that live here in [place]. Social alcohol consumption.  Most Recent Health Risks Assessment:   Most Recent Social Determinants of Health (Including Hx of Tobacco, Alcohol, and Drug Use) SDOH Screenings   Food Insecurity: No Food Insecurity (12/08/2024)  Housing: Low Risk (12/08/2024)  Transportation Needs: No Transportation Needs (12/08/2024)  Utilities: Not At Risk (12/08/2024)  Alcohol Screen: Medium Risk (12/07/2024)  Depression (PHQ2-9): Low Risk (10/20/2024)  Financial Resource Strain: Low Risk (12/08/2024)  Physical Activity: Insufficiently Active (12/07/2024)  Social Connections: Moderately Isolated (12/07/2024)  Stress: Stress Concern Present (12/08/2024)  Tobacco Use: Medium Risk (12/08/2024)  Health Literacy: Adequate Health Literacy (12/08/2024)   Social History   Tobacco Use   Smoking status: Former    Current packs/day: 0.00    Types: Cigarettes    Quit date: 04/19/1980    Years since quitting: 44.6   Smokeless tobacco: Never  Substance Use Topics   Alcohol use: Yes    Alcohol/week: 20.0 standard drinks of alcohol    Types: 20 Glasses of wine per week    Comment: socially   Drug use: No   Most Recent Functional Status Assessment:    12/07/2024    8:55 AM  In your present state of health, do you have any difficulty performing the following activities:  Hearing? 0   Vision? 0      Manually entered by patient   Most Recent Fall Risk Assessment:    12/08/2024     9:51 AM  Fall Risk   Falls in the past year? 1  Number falls in past yr: 0  Injury with Fall? 1  Risk for fall due to : Other (Comment)  Follow up Falls prevention discussed;Education provided;Falls evaluation completed   Most Recent Anxiety/Depression Screenings:    10/20/2024   12:35 PM 07/18/2024    2:28 PM  PHQ 2/9 Scores  PHQ - 2 Score 2 2  PHQ- 9 Score 3  6      Data saved with a previous flowsheet row definition   Most Recent Cognitive Screening:    09/06/2023   11:01 AM  6CIT Screen  What Year? 0 points  What month? 0 points  What time? 0 points  Count back from 20 0 points  Months in reverse 0 points  Repeat phrase 0 points  Total Score 0 points    Results:  Studies Obtained And Personally Reviewed By Me:   Labs:  CBC w/ Differential Lab Results  Component Value Date   WBC 5.8 11/03/2024   RBC 4.73 11/03/2024   HGB 12.7 11/03/2024   HCT 40.5 11/03/2024   PLT 211 11/03/2024   MCV 85.6 11/03/2024   MCH 26.8 (L) 11/03/2024   MCHC 31.4 (L) 11/03/2024   RDW 13.5 11/03/2024   MPV 11.0 11/03/2024   LYMPHSABS 1.1 10/16/2023   MONOABS 0.4 10/16/2023   BASOSABS 17 11/03/2024    Comprehensive Metabolic Panel Lab Results  Component Value Date   NA 142 11/03/2024   K 3.9 11/03/2024   CL 97 (L) 11/03/2024   CO2 36 (H) 11/03/2024   GLUCOSE 124 (H) 11/03/2024  BUN 9 11/03/2024   CREATININE 0.74 11/03/2024   CALCIUM  9.7 11/03/2024   PROT 7.2 11/03/2024   ALBUMIN 3.6 08/07/2024   AST 16 11/03/2024   ALT 8 11/03/2024   ALKPHOS 72 08/07/2024   BILITOT 0.4 11/03/2024   GFR 73.30 06/21/2015   EGFR 80 11/03/2024   GFRNONAA >60 08/07/2024   Lipid Panel  Lab Results  Component Value Date   CHOL 168 11/03/2024   HDL 78 11/03/2024   LDLCALC 66 11/03/2024   TRIG 165 (H) 11/03/2024   A1c Lab Results  Component Value Date   HGBA1C 6.1 (H) 11/03/2024    TSH Lab Results  Component Value Date   TSH 2.46 11/03/2024    Assessment & Plan:   On  August 18 of this year, she fell while in her home and was seen at [facility] for a laceration on her scalp that required 8 stitches to repair. On 08/17/2024 She was seen in this office to remove the staples. 7 staples were removed as it was suspected the remaining staple was hidden by dried blood. The final staple was removed on 08/22/24.     Anxiety and Insomnia: treated with alprazolam  2 mg at bedtime as needed.   Type 2 diabetes mellitus-  11/03/2024 HgbA1c 6.1% decreased from 6.6% nine months ago.   Coronary artery disease, palpitations, hyperlipidemia, hypertension: treated with metoprolol  tartrate 100 mg twice daily, hydrochlorothiazide  25 mg daily, rosuvastatin  20 mg daily, aspirin , clopidogrel . Followed by cardiologist, Dr. Ozell Fell. Status post PTCA with drug-eluting stent x1 to the mid RCA in April 2013. She returned to the Cath Lab and had stents placed in the mid LAD and proximal circumflex arteries.    GERD: treated with esomeprazole  40 mg daily.    Quit smoking in 1982.   History of mild cognitive deficits followed by Neurology.    Husband not able to drive- has dementia. She says he is stable. Situational stress with husband being ill. Goes to counseling as needed with [employer].   Health maintenance: Had eye exam in September.    Had Colonoscopy by Dr.Kaplan in 2005.Cologard not covered by Medicare at her age. Offered hemoccult cards for screening but she declined.     Annual Wellness Visit done today including the all of the following: Reviewed patient's Family Medical History Reviewed patient's SDOH and reviewed tobacco, alcohol, and drug use.  Reviewed and updated list of patient's medical providers Assessment of cognitive impairment was done Assessed patient's functional ability Established a written schedule for health screening services Health Risk Assessent Completed and Reviewed  Discussed health benefits of physical activity, and encouraged her to  engage in regular exercise appropriate for her age and condition.    I,Makayla C Reid,acting as a scribe for Ronal JINNY Hailstone, MD.,have documented all relevant documentation on the behalf of Ronal JINNY Hailstone, MD,as directed by  Ronal JINNY Hailstone, MD while in the presence of Ronal JINNY Hailstone, MD.  I, Ronal JINNY Hailstone, MD, have reviewed all documentation for and agree with the above Annual Wellness Visit documentation.  Ronal JINNY Hailstone, MD Internal Medicine 12/08/2024 "

## 2024-11-27 NOTE — Telephone Encounter (Signed)
*  STAT* If patient is at the pharmacy, call can be transferred to refill team.   1. Which medications need to be refilled? (please list name of each medication and dose if known)   metoprolol  tartrate (LOPRESSOR ) 50 MG tablet   2. Would you like to learn more about the convenience, safety, & potential cost savings by using the [facility]?   3. Are you open to using the [facility] (Type [facility]. ).   4. Which pharmacy/location (including street and city if local pharmacy) is medication to be sent to?  [id number] - [place], [place] - [address]   5. Do they need a 30 day or 90 day supply?   90 day  Patient stated she still has some medication left.

## 2024-11-28 NOTE — Telephone Encounter (Signed)
 Refill sent

## 2024-12-05 ENCOUNTER — Other Ambulatory Visit: Payer: Self-pay | Admitting: Cardiovascular Disease

## 2024-12-08 ENCOUNTER — Ambulatory Visit: Admitting: Internal Medicine

## 2024-12-08 VITALS — BP 120/80 | HR 73 | Ht 60.0 in | Wt 152.0 lb

## 2024-12-08 DIAGNOSIS — R413 Other amnesia: Secondary | ICD-10-CM

## 2024-12-08 DIAGNOSIS — K219 Gastro-esophageal reflux disease without esophagitis: Secondary | ICD-10-CM | POA: Diagnosis not present

## 2024-12-08 DIAGNOSIS — E785 Hyperlipidemia, unspecified: Secondary | ICD-10-CM

## 2024-12-08 DIAGNOSIS — I252 Old myocardial infarction: Secondary | ICD-10-CM

## 2024-12-08 DIAGNOSIS — I10 Essential (primary) hypertension: Secondary | ICD-10-CM | POA: Diagnosis not present

## 2024-12-08 DIAGNOSIS — E119 Type 2 diabetes mellitus without complications: Secondary | ICD-10-CM

## 2024-12-08 DIAGNOSIS — G4700 Insomnia, unspecified: Secondary | ICD-10-CM

## 2024-12-08 DIAGNOSIS — Z0000 Encounter for general adult medical examination without abnormal findings: Secondary | ICD-10-CM | POA: Diagnosis not present

## 2024-12-08 DIAGNOSIS — F419 Anxiety disorder, unspecified: Secondary | ICD-10-CM

## 2024-12-08 DIAGNOSIS — I2510 Atherosclerotic heart disease of native coronary artery without angina pectoris: Secondary | ICD-10-CM

## 2024-12-08 DIAGNOSIS — G4733 Obstructive sleep apnea (adult) (pediatric): Secondary | ICD-10-CM

## 2024-12-08 DIAGNOSIS — F439 Reaction to severe stress, unspecified: Secondary | ICD-10-CM

## 2024-12-08 DIAGNOSIS — Z955 Presence of coronary angioplasty implant and graft: Secondary | ICD-10-CM

## 2024-12-08 DIAGNOSIS — E781 Pure hyperglyceridemia: Secondary | ICD-10-CM

## 2024-12-08 DIAGNOSIS — G4709 Other insomnia: Secondary | ICD-10-CM

## 2024-12-08 NOTE — Patient Instructions (Signed)
 It was a pleasure to see you today. Return in June 2026 for 6 month follow up. No change in medications.

## 2024-12-16 ENCOUNTER — Encounter: Payer: Self-pay | Admitting: Internal Medicine

## 2024-12-26 ENCOUNTER — Ambulatory Visit: Admitting: Internal Medicine

## 2024-12-30 ENCOUNTER — Other Ambulatory Visit: Payer: Self-pay | Admitting: Cardiovascular Disease

## 2025-01-01 ENCOUNTER — Encounter: Payer: Self-pay | Admitting: Internal Medicine

## 2025-01-01 ENCOUNTER — Telehealth: Payer: Self-pay | Admitting: Internal Medicine

## 2025-01-01 DIAGNOSIS — F419 Anxiety disorder, unspecified: Secondary | ICD-10-CM

## 2025-01-01 DIAGNOSIS — G4709 Other insomnia: Secondary | ICD-10-CM

## 2025-01-01 MED ORDER — ALPRAZOLAM 2 MG PO TABS: ORAL_TABLET | ORAL | 1 refills | Status: AC

## 2025-01-01 NOTE — Telephone Encounter (Signed)
 Done

## 2025-01-01 NOTE — Telephone Encounter (Signed)
 Refill Xanax  as requested by patient. Sent in #90 2 mg tabs with I refill to take at bedtime. This has ben a chronic med for her for years. [place] PMP aware checked.  MJB, MD

## 2025-06-14 ENCOUNTER — Other Ambulatory Visit

## 2025-06-15 ENCOUNTER — Ambulatory Visit: Admitting: Internal Medicine
